# Patient Record
Sex: Male | Born: 1970 | Race: Black or African American | Hispanic: No | Marital: Single | Smoking: Current every day smoker
Health system: Southern US, Community
[De-identification: ages and names within clinical notes are randomized; demographics above are authoritative.]

## PROBLEM LIST (undated history)

## (undated) DIAGNOSIS — M545 Low back pain, unspecified: Secondary | ICD-10-CM

## (undated) DIAGNOSIS — F209 Schizophrenia, unspecified: Secondary | ICD-10-CM

## (undated) DIAGNOSIS — F32A Depression, unspecified: Secondary | ICD-10-CM

## (undated) DIAGNOSIS — B20 Human immunodeficiency virus [HIV] disease: Secondary | ICD-10-CM

## (undated) DIAGNOSIS — F1010 Alcohol abuse, uncomplicated: Secondary | ICD-10-CM

## (undated) DIAGNOSIS — F319 Bipolar disorder, unspecified: Secondary | ICD-10-CM

## (undated) DIAGNOSIS — G8929 Other chronic pain: Secondary | ICD-10-CM

## (undated) DIAGNOSIS — F329 Major depressive disorder, single episode, unspecified: Secondary | ICD-10-CM

## (undated) DIAGNOSIS — Z5900 Homelessness unspecified: Secondary | ICD-10-CM

## (undated) DIAGNOSIS — J45909 Unspecified asthma, uncomplicated: Secondary | ICD-10-CM

## (undated) DIAGNOSIS — Z21 Asymptomatic human immunodeficiency virus [HIV] infection status: Secondary | ICD-10-CM

## (undated) DIAGNOSIS — F1410 Cocaine abuse, uncomplicated: Secondary | ICD-10-CM

## (undated) DIAGNOSIS — F419 Anxiety disorder, unspecified: Secondary | ICD-10-CM

## (undated) DIAGNOSIS — M109 Gout, unspecified: Secondary | ICD-10-CM

## (undated) DIAGNOSIS — F1210 Cannabis abuse, uncomplicated: Secondary | ICD-10-CM

## (undated) DIAGNOSIS — F1020 Alcohol dependence, uncomplicated: Secondary | ICD-10-CM

## (undated) DIAGNOSIS — I10 Essential (primary) hypertension: Secondary | ICD-10-CM

## (undated) DIAGNOSIS — F99 Mental disorder, not otherwise specified: Secondary | ICD-10-CM

## (undated) HISTORY — DX: Gout, unspecified: M10.9

## (undated) HISTORY — DX: Homelessness unspecified: Z59.00

## (undated) HISTORY — DX: Cocaine abuse, uncomplicated: F14.10

## (undated) HISTORY — DX: Alcohol dependence, uncomplicated: F10.20

## (undated) HISTORY — DX: Homelessness: Z59.0

## (undated) HISTORY — DX: Cannabis abuse, uncomplicated: F12.10

## (undated) HISTORY — PX: SKIN GRAFT FULL THICKNESS LEG: SUR1299

---

## 1997-10-08 ENCOUNTER — Emergency Department (HOSPITAL_COMMUNITY): Admission: EM | Admit: 1997-10-08 | Discharge: 1997-10-08 | Payer: Self-pay | Admitting: *Deleted

## 1997-10-11 ENCOUNTER — Emergency Department (HOSPITAL_COMMUNITY): Admission: EM | Admit: 1997-10-11 | Discharge: 1997-10-11 | Payer: Self-pay | Admitting: Emergency Medicine

## 1997-10-18 ENCOUNTER — Emergency Department (HOSPITAL_COMMUNITY): Admission: EM | Admit: 1997-10-18 | Discharge: 1997-10-18 | Payer: Self-pay | Admitting: Emergency Medicine

## 2002-01-21 ENCOUNTER — Emergency Department (HOSPITAL_COMMUNITY): Admission: EM | Admit: 2002-01-21 | Discharge: 2002-01-21 | Payer: Self-pay | Admitting: Emergency Medicine

## 2002-02-21 ENCOUNTER — Encounter: Payer: Self-pay | Admitting: Emergency Medicine

## 2002-02-21 ENCOUNTER — Emergency Department (HOSPITAL_COMMUNITY): Admission: EM | Admit: 2002-02-21 | Discharge: 2002-02-21 | Payer: Self-pay | Admitting: Emergency Medicine

## 2002-02-26 ENCOUNTER — Emergency Department (HOSPITAL_COMMUNITY): Admission: EM | Admit: 2002-02-26 | Discharge: 2002-02-26 | Payer: Self-pay | Admitting: Emergency Medicine

## 2002-04-01 ENCOUNTER — Encounter: Payer: Self-pay | Admitting: Emergency Medicine

## 2002-04-01 ENCOUNTER — Emergency Department (HOSPITAL_COMMUNITY): Admission: EM | Admit: 2002-04-01 | Discharge: 2002-04-01 | Payer: Self-pay | Admitting: Emergency Medicine

## 2002-05-08 ENCOUNTER — Emergency Department (HOSPITAL_COMMUNITY): Admission: EM | Admit: 2002-05-08 | Discharge: 2002-05-08 | Payer: Self-pay | Admitting: Emergency Medicine

## 2002-07-24 ENCOUNTER — Emergency Department (HOSPITAL_COMMUNITY): Admission: EM | Admit: 2002-07-24 | Discharge: 2002-07-24 | Payer: Self-pay | Admitting: Emergency Medicine

## 2002-12-04 ENCOUNTER — Encounter: Payer: Self-pay | Admitting: Emergency Medicine

## 2002-12-04 ENCOUNTER — Emergency Department (HOSPITAL_COMMUNITY): Admission: EM | Admit: 2002-12-04 | Discharge: 2002-12-04 | Payer: Self-pay | Admitting: Emergency Medicine

## 2002-12-11 ENCOUNTER — Emergency Department (HOSPITAL_COMMUNITY): Admission: EM | Admit: 2002-12-11 | Discharge: 2002-12-11 | Payer: Self-pay | Admitting: Emergency Medicine

## 2003-01-12 ENCOUNTER — Emergency Department (HOSPITAL_COMMUNITY): Admission: EM | Admit: 2003-01-12 | Discharge: 2003-01-12 | Payer: Self-pay | Admitting: Emergency Medicine

## 2003-01-24 ENCOUNTER — Emergency Department (HOSPITAL_COMMUNITY): Admission: EM | Admit: 2003-01-24 | Discharge: 2003-01-24 | Payer: Self-pay | Admitting: Emergency Medicine

## 2003-03-04 ENCOUNTER — Emergency Department (HOSPITAL_COMMUNITY): Admission: EM | Admit: 2003-03-04 | Discharge: 2003-03-04 | Payer: Self-pay | Admitting: Emergency Medicine

## 2005-04-12 ENCOUNTER — Emergency Department (HOSPITAL_COMMUNITY): Admission: EM | Admit: 2005-04-12 | Discharge: 2005-04-12 | Payer: Self-pay | Admitting: Emergency Medicine

## 2005-04-22 ENCOUNTER — Emergency Department (HOSPITAL_COMMUNITY): Admission: EM | Admit: 2005-04-22 | Discharge: 2005-04-22 | Payer: Self-pay | Admitting: Emergency Medicine

## 2005-05-19 ENCOUNTER — Emergency Department (HOSPITAL_COMMUNITY): Admission: EM | Admit: 2005-05-19 | Discharge: 2005-05-19 | Payer: Self-pay | Admitting: Emergency Medicine

## 2005-07-04 ENCOUNTER — Emergency Department (HOSPITAL_COMMUNITY): Admission: EM | Admit: 2005-07-04 | Discharge: 2005-07-04 | Payer: Self-pay | Admitting: Emergency Medicine

## 2005-07-28 ENCOUNTER — Emergency Department (HOSPITAL_COMMUNITY): Admission: EM | Admit: 2005-07-28 | Discharge: 2005-07-28 | Payer: Self-pay | Admitting: Emergency Medicine

## 2005-08-23 ENCOUNTER — Emergency Department (HOSPITAL_COMMUNITY): Admission: EM | Admit: 2005-08-23 | Discharge: 2005-08-23 | Payer: Self-pay | Admitting: Emergency Medicine

## 2005-09-14 ENCOUNTER — Emergency Department (HOSPITAL_COMMUNITY): Admission: EM | Admit: 2005-09-14 | Discharge: 2005-09-14 | Payer: Self-pay | Admitting: Emergency Medicine

## 2005-10-21 ENCOUNTER — Emergency Department (HOSPITAL_COMMUNITY): Admission: EM | Admit: 2005-10-21 | Discharge: 2005-10-22 | Payer: Self-pay | Admitting: Emergency Medicine

## 2005-11-21 ENCOUNTER — Emergency Department (HOSPITAL_COMMUNITY): Admission: EM | Admit: 2005-11-21 | Discharge: 2005-11-21 | Payer: Self-pay | Admitting: Emergency Medicine

## 2006-01-19 ENCOUNTER — Emergency Department (HOSPITAL_COMMUNITY): Admission: EM | Admit: 2006-01-19 | Discharge: 2006-01-19 | Payer: Self-pay | Admitting: Emergency Medicine

## 2006-01-20 ENCOUNTER — Ambulatory Visit: Payer: Self-pay | Admitting: *Deleted

## 2006-02-04 ENCOUNTER — Emergency Department (HOSPITAL_COMMUNITY): Admission: EM | Admit: 2006-02-04 | Discharge: 2006-02-04 | Payer: Self-pay | Admitting: Emergency Medicine

## 2006-02-16 ENCOUNTER — Emergency Department (HOSPITAL_COMMUNITY): Admission: EM | Admit: 2006-02-16 | Discharge: 2006-02-16 | Payer: Self-pay | Admitting: Emergency Medicine

## 2006-03-01 ENCOUNTER — Emergency Department (HOSPITAL_COMMUNITY): Admission: EM | Admit: 2006-03-01 | Discharge: 2006-03-01 | Payer: Self-pay | Admitting: Emergency Medicine

## 2006-05-06 ENCOUNTER — Emergency Department (HOSPITAL_COMMUNITY): Admission: EM | Admit: 2006-05-06 | Discharge: 2006-05-06 | Payer: Self-pay | Admitting: Emergency Medicine

## 2006-06-02 ENCOUNTER — Emergency Department (HOSPITAL_COMMUNITY): Admission: EM | Admit: 2006-06-02 | Discharge: 2006-06-02 | Payer: Self-pay | Admitting: Emergency Medicine

## 2006-07-27 ENCOUNTER — Emergency Department (HOSPITAL_COMMUNITY): Admission: EM | Admit: 2006-07-27 | Discharge: 2006-07-27 | Payer: Self-pay | Admitting: Emergency Medicine

## 2006-10-11 ENCOUNTER — Emergency Department (HOSPITAL_COMMUNITY): Admission: EM | Admit: 2006-10-11 | Discharge: 2006-10-11 | Payer: Self-pay | Admitting: *Deleted

## 2006-10-30 ENCOUNTER — Emergency Department (HOSPITAL_COMMUNITY): Admission: EM | Admit: 2006-10-30 | Discharge: 2006-10-30 | Payer: Self-pay | Admitting: Emergency Medicine

## 2006-12-18 ENCOUNTER — Emergency Department (HOSPITAL_COMMUNITY): Admission: EM | Admit: 2006-12-18 | Discharge: 2006-12-18 | Payer: Self-pay | Admitting: Emergency Medicine

## 2007-03-17 ENCOUNTER — Emergency Department (HOSPITAL_COMMUNITY): Admission: EM | Admit: 2007-03-17 | Discharge: 2007-03-17 | Payer: Self-pay | Admitting: Emergency Medicine

## 2007-04-06 ENCOUNTER — Emergency Department (HOSPITAL_COMMUNITY): Admission: EM | Admit: 2007-04-06 | Discharge: 2007-04-06 | Payer: Self-pay | Admitting: Emergency Medicine

## 2007-05-02 ENCOUNTER — Emergency Department (HOSPITAL_COMMUNITY): Admission: EM | Admit: 2007-05-02 | Discharge: 2007-05-02 | Payer: Self-pay | Admitting: Emergency Medicine

## 2007-05-22 ENCOUNTER — Emergency Department (HOSPITAL_COMMUNITY): Admission: EM | Admit: 2007-05-22 | Discharge: 2007-05-22 | Payer: Self-pay | Admitting: Emergency Medicine

## 2007-06-01 ENCOUNTER — Emergency Department (HOSPITAL_COMMUNITY): Admission: EM | Admit: 2007-06-01 | Discharge: 2007-06-01 | Payer: Self-pay | Admitting: Emergency Medicine

## 2008-01-05 ENCOUNTER — Emergency Department (HOSPITAL_COMMUNITY): Admission: EM | Admit: 2008-01-05 | Discharge: 2008-01-05 | Payer: Self-pay | Admitting: Emergency Medicine

## 2008-02-28 ENCOUNTER — Emergency Department (HOSPITAL_COMMUNITY): Admission: EM | Admit: 2008-02-28 | Discharge: 2008-02-28 | Payer: Self-pay | Admitting: Emergency Medicine

## 2008-04-25 ENCOUNTER — Emergency Department (HOSPITAL_COMMUNITY): Admission: EM | Admit: 2008-04-25 | Discharge: 2008-04-25 | Payer: Self-pay | Admitting: Emergency Medicine

## 2008-05-08 ENCOUNTER — Emergency Department (HOSPITAL_COMMUNITY): Admission: EM | Admit: 2008-05-08 | Discharge: 2008-05-08 | Payer: Self-pay | Admitting: Emergency Medicine

## 2008-05-13 ENCOUNTER — Emergency Department (HOSPITAL_COMMUNITY): Admission: EM | Admit: 2008-05-13 | Discharge: 2008-05-13 | Payer: Self-pay | Admitting: Emergency Medicine

## 2008-09-10 ENCOUNTER — Emergency Department (HOSPITAL_COMMUNITY): Admission: EM | Admit: 2008-09-10 | Discharge: 2008-09-10 | Payer: Self-pay | Admitting: Emergency Medicine

## 2008-11-28 ENCOUNTER — Emergency Department (HOSPITAL_COMMUNITY): Admission: EM | Admit: 2008-11-28 | Discharge: 2008-11-28 | Payer: Self-pay | Admitting: Emergency Medicine

## 2009-02-13 ENCOUNTER — Emergency Department (HOSPITAL_COMMUNITY): Admission: EM | Admit: 2009-02-13 | Discharge: 2009-02-13 | Payer: Self-pay | Admitting: Emergency Medicine

## 2009-02-27 ENCOUNTER — Emergency Department (HOSPITAL_COMMUNITY): Admission: EM | Admit: 2009-02-27 | Discharge: 2009-02-27 | Payer: Self-pay | Admitting: Emergency Medicine

## 2009-03-01 ENCOUNTER — Emergency Department (HOSPITAL_COMMUNITY): Admission: EM | Admit: 2009-03-01 | Discharge: 2009-03-01 | Payer: Self-pay | Admitting: Emergency Medicine

## 2009-03-04 ENCOUNTER — Emergency Department (HOSPITAL_COMMUNITY): Admission: EM | Admit: 2009-03-04 | Discharge: 2009-03-04 | Payer: Self-pay | Admitting: Emergency Medicine

## 2009-03-11 ENCOUNTER — Emergency Department (HOSPITAL_COMMUNITY): Admission: EM | Admit: 2009-03-11 | Discharge: 2009-03-12 | Payer: Self-pay | Admitting: Emergency Medicine

## 2009-03-25 ENCOUNTER — Emergency Department (HOSPITAL_COMMUNITY): Admission: EM | Admit: 2009-03-25 | Discharge: 2009-03-25 | Payer: Self-pay | Admitting: Emergency Medicine

## 2009-04-26 ENCOUNTER — Emergency Department (HOSPITAL_COMMUNITY): Admission: EM | Admit: 2009-04-26 | Discharge: 2009-04-26 | Payer: Self-pay | Admitting: Emergency Medicine

## 2009-04-29 ENCOUNTER — Ambulatory Visit: Payer: Self-pay | Admitting: Internal Medicine

## 2009-05-01 ENCOUNTER — Ambulatory Visit: Payer: Self-pay | Admitting: Internal Medicine

## 2009-05-04 ENCOUNTER — Emergency Department (HOSPITAL_COMMUNITY): Admission: EM | Admit: 2009-05-04 | Discharge: 2009-05-04 | Payer: Self-pay | Admitting: Emergency Medicine

## 2009-05-29 ENCOUNTER — Emergency Department (HOSPITAL_COMMUNITY): Admission: EM | Admit: 2009-05-29 | Discharge: 2009-05-29 | Payer: Self-pay | Admitting: Emergency Medicine

## 2009-06-03 ENCOUNTER — Emergency Department (HOSPITAL_COMMUNITY): Admission: EM | Admit: 2009-06-03 | Discharge: 2009-06-03 | Payer: Self-pay | Admitting: Emergency Medicine

## 2009-06-25 ENCOUNTER — Emergency Department (HOSPITAL_COMMUNITY): Admission: EM | Admit: 2009-06-25 | Discharge: 2009-06-25 | Payer: Self-pay | Admitting: Emergency Medicine

## 2009-08-09 ENCOUNTER — Emergency Department (HOSPITAL_COMMUNITY): Admission: EM | Admit: 2009-08-09 | Discharge: 2009-08-09 | Payer: Self-pay | Admitting: Emergency Medicine

## 2009-09-21 ENCOUNTER — Emergency Department (HOSPITAL_COMMUNITY): Admission: EM | Admit: 2009-09-21 | Discharge: 2009-09-22 | Payer: Self-pay | Admitting: Emergency Medicine

## 2009-09-25 ENCOUNTER — Emergency Department (HOSPITAL_COMMUNITY): Admission: EM | Admit: 2009-09-25 | Discharge: 2009-09-26 | Payer: Self-pay | Admitting: Emergency Medicine

## 2009-09-26 ENCOUNTER — Emergency Department (HOSPITAL_COMMUNITY): Admission: EM | Admit: 2009-09-26 | Discharge: 2009-09-26 | Payer: Self-pay | Admitting: Emergency Medicine

## 2009-10-18 ENCOUNTER — Emergency Department (HOSPITAL_COMMUNITY): Admission: EM | Admit: 2009-10-18 | Discharge: 2009-10-19 | Payer: Self-pay | Admitting: Emergency Medicine

## 2009-11-03 ENCOUNTER — Emergency Department (HOSPITAL_COMMUNITY): Admission: EM | Admit: 2009-11-03 | Discharge: 2009-11-03 | Payer: Self-pay | Admitting: Emergency Medicine

## 2009-11-08 ENCOUNTER — Emergency Department (HOSPITAL_COMMUNITY): Admission: EM | Admit: 2009-11-08 | Discharge: 2009-11-08 | Payer: Self-pay | Admitting: Emergency Medicine

## 2010-02-25 ENCOUNTER — Emergency Department (HOSPITAL_COMMUNITY)
Admission: EM | Admit: 2010-02-25 | Discharge: 2010-02-26 | Payer: Self-pay | Source: Home / Self Care | Admitting: Emergency Medicine

## 2010-03-19 ENCOUNTER — Encounter: Payer: Self-pay | Admitting: Adult Health

## 2010-03-19 DIAGNOSIS — B20 Human immunodeficiency virus [HIV] disease: Secondary | ICD-10-CM | POA: Insufficient documentation

## 2010-03-25 ENCOUNTER — Ambulatory Visit: Admit: 2010-03-25 | Payer: Self-pay | Admitting: Adult Health

## 2010-03-26 ENCOUNTER — Ambulatory Visit: Admit: 2010-03-26 | Payer: Self-pay | Admitting: Adult Health

## 2010-04-08 ENCOUNTER — Encounter: Payer: Self-pay | Admitting: Adult Health

## 2010-04-08 ENCOUNTER — Ambulatory Visit
Admission: RE | Admit: 2010-04-08 | Discharge: 2010-04-08 | Payer: Self-pay | Source: Home / Self Care | Attending: Adult Health | Admitting: Adult Health

## 2010-04-08 LAB — CONVERTED CEMR LAB
HIV 1 RNA Quant: 60 copies/mL — ABNORMAL HIGH (ref <20)
HIV-1 RNA Quant, Log: 1.78 — ABNORMAL HIGH (ref <1.30)

## 2010-04-09 LAB — T-HELPER CELL (CD4) - (RCID CLINIC ONLY): CD4 T Cell Abs: 1180 uL (ref 400–2700)

## 2010-04-10 ENCOUNTER — Ambulatory Visit: Admit: 2010-04-10 | Payer: Self-pay | Admitting: Adult Health

## 2010-04-16 LAB — CONVERTED CEMR LAB
ALT: 93 units/L — ABNORMAL HIGH (ref 0–53)
AST: 78 units/L — ABNORMAL HIGH (ref 0–37)
Basophils Absolute: 0 10*3/uL (ref 0.0–0.1)
CO2: 21 meq/L (ref 19–32)
Calcium: 9.3 mg/dL (ref 8.4–10.5)
Chloride: 108 meq/L (ref 96–112)
Cholesterol: 148 mg/dL (ref 0–200)
Creatinine, Ser: 0.86 mg/dL (ref 0.40–1.50)
Eosinophils Absolute: 0.2 10*3/uL (ref 0.0–0.7)
Eosinophils Relative: 4 % (ref 0–5)
HCT: 43.1 % (ref 39.0–52.0)
HIV-1 antibody: POSITIVE — AB
HIV-2 Ab: NEGATIVE
Hep A Total Ab: NEGATIVE
Hep B Core Total Ab: POSITIVE — AB
Hep B S Ab: NEGATIVE
MCV: 91.7 fL (ref 78.0–100.0)
Neutrophils Relative %: 28 % — ABNORMAL LOW (ref 43–77)
Platelets: 239 10*3/uL (ref 150–400)
RDW: 15.1 % (ref 11.5–15.5)
Sodium: 144 meq/L (ref 135–145)
Total Bilirubin: 0.4 mg/dL (ref 0.3–1.2)
Total Protein: 7.4 g/dL (ref 6.0–8.3)
Triglycerides: 94 mg/dL (ref <150)

## 2010-04-16 NOTE — Miscellaneous (Signed)
Summary: Orders Update  Clinical Lists Changes  Problems: Added new problem of HIV INFECTION (ICD-042) Orders: Added new Test order of T-Chlamydia  Probe, urine [id number]) - Signed Added new Test order of T-CBC w/Diff [phone]) - Signed Added new Test order of T-CD4SP [facility]) (CD4SP) - Signed Added new Test order of T-GC Probe, urine [id number]) - Signed Added new Test order of T-Comprehensive Metabolic Panel [phone]) - Signed Added new Test order of T-Hepatitis B Surface Antigen [phone]) - Signed Added new Test order of T-Hepatitis B Surface Antibody [id number]) - Signed Added new Test order of T-Hepatitis B Core Antibody ([id number]) - Signed Added new Test order of T-Hepatitis A Antibody ([id number]) - Signed Added new Test order of T-Hepatitis C Viral Load ([id number]) - Signed Added new Test order of T-HIV Viral Load [phone]) - Signed Added new Test order of T-HIV Ab Confirmatory Test/Western Blot ([id number]) - Signed Added new Test order of T-RPR (Syphilis) ([id number]) - Signed Added new Test order of T-Lipid Profile ([id number]) - Signed Added new Test order of T-Urinalysis ([id number]) - Signed

## 2010-04-23 ENCOUNTER — Ambulatory Visit (INDEPENDENT_AMBULATORY_CARE_PROVIDER_SITE_OTHER): Payer: Self-pay | Admitting: Adult Health

## 2010-04-23 DIAGNOSIS — B20 Human immunodeficiency virus [HIV] disease: Secondary | ICD-10-CM

## 2010-04-27 ENCOUNTER — Encounter: Payer: Self-pay | Admitting: Infectious Diseases

## 2010-05-06 NOTE — Miscellaneous (Signed)
Summary: HIPAA Restrictions  HIPAA Restrictions   Imported By: Florinda Marker 04/27/2010 17:52:28  _____________________________________________________________________  External Attachment:    Type:   Image     Comment:   External Document

## 2010-05-29 LAB — CBC
HCT: 39.9 % (ref 39.0–52.0)
MCHC: 34.8 g/dL (ref 30.0–36.0)
Platelets: 333 10*3/uL (ref 150–400)
RDW: 12.5 % (ref 11.5–15.5)

## 2010-05-29 LAB — BASIC METABOLIC PANEL
BUN: 12 mg/dL (ref 6–23)
Calcium: 8.8 mg/dL (ref 8.4–10.5)
Creatinine, Ser: 0.91 mg/dL (ref 0.4–1.5)
GFR calc non Af Amer: >60 mL/min (ref >60)

## 2010-05-29 LAB — POCT CARDIAC MARKERS
Myoglobin, poc: 57.4 ng/mL (ref 12–200)
Troponin i, poc: <0.05 ng/mL (ref 0.00–0.09)

## 2010-05-29 LAB — DIFFERENTIAL
Basophils Absolute: 0 10*3/uL (ref 0.0–0.1)
Basophils Relative: 1 % (ref 0–1)
Monocytes Absolute: 0.5 10*3/uL (ref 0.1–1.0)
Neutro Abs: 2.4 10*3/uL (ref 1.7–7.7)
Neutrophils Relative %: 44 % (ref 43–77)

## 2010-06-04 ENCOUNTER — Ambulatory Visit: Payer: Self-pay | Admitting: Adult Health

## 2010-06-11 ENCOUNTER — Emergency Department (HOSPITAL_COMMUNITY): Payer: Self-pay

## 2010-06-11 ENCOUNTER — Emergency Department (HOSPITAL_COMMUNITY)
Admission: EM | Admit: 2010-06-11 | Discharge: 2010-06-11 | Disposition: A | Discharge status: Home / Self Care | Payer: Self-pay | Attending: Emergency Medicine | Admitting: Emergency Medicine

## 2010-06-11 DIAGNOSIS — R0609 Other forms of dyspnea: Secondary | ICD-10-CM | POA: Insufficient documentation

## 2010-06-11 DIAGNOSIS — J45909 Unspecified asthma, uncomplicated: Secondary | ICD-10-CM | POA: Insufficient documentation

## 2010-06-11 DIAGNOSIS — J40 Bronchitis, not specified as acute or chronic: Secondary | ICD-10-CM | POA: Insufficient documentation

## 2010-06-11 DIAGNOSIS — R0989 Other specified symptoms and signs involving the circulatory and respiratory systems: Secondary | ICD-10-CM | POA: Insufficient documentation

## 2010-06-11 DIAGNOSIS — R079 Chest pain, unspecified: Secondary | ICD-10-CM | POA: Insufficient documentation

## 2010-06-11 DIAGNOSIS — R000 Tachycardia, unspecified: Secondary | ICD-10-CM | POA: Insufficient documentation

## 2010-06-11 DIAGNOSIS — Z21 Asymptomatic human immunodeficiency virus [HIV] infection status: Secondary | ICD-10-CM | POA: Insufficient documentation

## 2010-06-11 DIAGNOSIS — R0602 Shortness of breath: Secondary | ICD-10-CM | POA: Insufficient documentation

## 2010-06-11 DIAGNOSIS — F411 Generalized anxiety disorder: Secondary | ICD-10-CM | POA: Insufficient documentation

## 2010-06-11 DIAGNOSIS — R059 Cough, unspecified: Secondary | ICD-10-CM | POA: Insufficient documentation

## 2010-06-11 DIAGNOSIS — R05 Cough: Secondary | ICD-10-CM | POA: Insufficient documentation

## 2010-06-11 LAB — DIFFERENTIAL
Basophils Absolute: 0.1 10*3/uL (ref 0.0–0.1)
Eosinophils Relative: 3 % (ref 0–5)
Lymphocytes Relative: 54 % — ABNORMAL HIGH (ref 12–46)
Lymphs Abs: 2.5 10*3/uL (ref 0.7–4.0)
Monocytes Absolute: 0.6 10*3/uL (ref 0.1–1.0)

## 2010-06-11 LAB — URINALYSIS, ROUTINE W REFLEX MICROSCOPIC
Leukocytes, UA: NEGATIVE
Nitrite: NEGATIVE
Specific Gravity, Urine: 1.029 (ref 1.005–1.030)
pH: 5.5 (ref 5.0–8.0)

## 2010-06-11 LAB — URINE MICROSCOPIC-ADD ON

## 2010-06-11 LAB — CBC
HCT: 43.6 % (ref 39.0–52.0)
MCHC: 35.6 g/dL (ref 30.0–36.0)
MCV: 92.6 fL (ref 78.0–100.0)
RDW: 13.3 % (ref 11.5–15.5)

## 2010-06-11 LAB — LACTATE DEHYDROGENASE: LDH: 215 U/L (ref 94–250)

## 2010-06-11 LAB — COMPREHENSIVE METABOLIC PANEL
Albumin: 4.1 g/dL (ref 3.5–5.2)
BUN: 10 mg/dL (ref 6–23)
Creatinine, Ser: 0.99 mg/dL (ref 0.4–1.5)
Total Protein: 7.3 g/dL (ref 6.0–8.3)

## 2010-06-12 LAB — URINE CULTURE: Culture  Setup Time: 201203291258

## 2010-06-25 ENCOUNTER — Encounter: Payer: Self-pay | Admitting: Adult Health

## 2010-06-25 ENCOUNTER — Ambulatory Visit (INDEPENDENT_AMBULATORY_CARE_PROVIDER_SITE_OTHER): Payer: Self-pay | Admitting: Adult Health

## 2010-06-25 DIAGNOSIS — Z79899 Other long term (current) drug therapy: Secondary | ICD-10-CM

## 2010-06-25 DIAGNOSIS — B20 Human immunodeficiency virus [HIV] disease: Secondary | ICD-10-CM

## 2010-06-25 DIAGNOSIS — E785 Hyperlipidemia, unspecified: Secondary | ICD-10-CM

## 2010-06-25 LAB — LIPID PANEL
Cholesterol: 168 mg/dL (ref 0–200)
HDL: 103 mg/dL (ref >39)
Total CHOL/HDL Ratio: 1.6 Ratio

## 2010-06-25 NOTE — Progress Notes (Signed)
  Subjective:    Patient ID: John [name], male    DOB: [birth year], 40 y.o.   MRN: [id number]  HPI In for f/u.  Has not had labs drawn for re-staging.  Not on meds at present, but  Interested in starting therapy.   Review of Systems  Constitutional: Negative.   HENT: Negative.   Eyes: Negative.   Respiratory: Negative.   Cardiovascular: Negative.   Gastrointestinal: Negative.   Genitourinary: Negative.   Musculoskeletal: Negative.   Skin: Negative.   Neurological: Negative.   Hematological: Negative.   Psychiatric/Behavioral: Negative.        Objective:   Physical Exam  Constitutional: He is oriented to person, place, and time. He appears well-developed and well-nourished.  HENT:  Head: Normocephalic and atraumatic.  Right Ear: External ear normal.  Left Ear: External ear normal.  Nose: Nose normal.  Mouth/Throat: Oropharynx is clear and moist.  Eyes: Conjunctivae are normal. Pupils are equal, round, and reactive to light.  Neck: Normal range of motion. Neck supple.  Cardiovascular: Normal rate, regular rhythm, normal heart sounds and intact distal pulses.   Pulmonary/Chest: Effort normal and breath sounds normal.  Abdominal: Soft. Bowel sounds are normal.  Musculoskeletal: Normal range of motion.  Neurological: He is alert and oriented to person, place, and time. Coordination normal.  Skin: Skin is warm and dry.  Psychiatric: He has a normal mood and affect. His behavior is normal. Judgment and thought content normal.          Assessment & Plan:  HIV:  No recent labs.  Previous CD4 was 1180 and VL 60 copies/ml.  While he demonstrates a poorly-fit virus, we should obtain re-staging labs to determine stability in this response.  Meanwhile, we will refer him to Deirdre Evener, RN from Research to see if he might be qualifiable for the 5303 study.  He should RTC in 2 weeks for f/u.  Dyslipidemia:  We have a random LD of 212.  We will recheck lipids today and f/u on next  visit.

## 2010-06-26 LAB — T-HELPER CELL (CD4) - (RCID CLINIC ONLY)
CD4 % Helper T Cell: 39 % (ref 33–55)
CD4 T Cell Abs: 520 uL (ref 400–2700)

## 2010-06-27 LAB — HIV-1 RNA ULTRAQUANT REFLEX TO GENTYP+: HIV-1 RNA Quant, Log: 2 {Log} — ABNORMAL HIGH (ref <1.30)

## 2010-07-17 ENCOUNTER — Ambulatory Visit (INDEPENDENT_AMBULATORY_CARE_PROVIDER_SITE_OTHER): Payer: Self-pay | Admitting: Adult Health

## 2010-07-17 ENCOUNTER — Encounter: Payer: Self-pay | Admitting: Adult Health

## 2010-07-17 VITALS — BP 112/79 | HR 80 | Temp 98.3°F | Ht 69.0 in | Wt 169.0 lb

## 2010-07-17 DIAGNOSIS — Z23 Encounter for immunization: Secondary | ICD-10-CM

## 2010-07-17 DIAGNOSIS — B20 Human immunodeficiency virus [HIV] disease: Secondary | ICD-10-CM

## 2010-07-17 DIAGNOSIS — J309 Allergic rhinitis, unspecified: Secondary | ICD-10-CM

## 2010-07-17 NOTE — Progress Notes (Signed)
Subjective:    Patient ID: John [name], male    DOB: 1970-07-02, 40 y.o.   MRN: [id number]  HPI presents to clinic today to discuss starting antiretroviral therapy. He also complains of a two-day onset of sinus congestion and drainage with watery, irritated eyes and an irritating cough. Denies any fevers, chills, sweats, shortness of breath or sinus pressure. [name] definitely wants to be on antiretroviral therapy, but is uncertain of what treatment. Strategies. Would entail.     Review of Systems  Constitutional: Negative.   HENT: Positive for congestion, rhinorrhea, sneezing and postnasal drip. Negative for ear pain, nosebleeds, sore throat, facial swelling, drooling, mouth sores, trouble swallowing, dental problem, voice change, sinus pressure, tinnitus and ear discharge.   Eyes: Positive for redness and itching. Negative for photophobia, pain, discharge and visual disturbance.  Respiratory: Positive for cough. Negative for apnea, choking, chest tightness, shortness of breath, wheezing and stridor.   Cardiovascular: Negative.   Gastrointestinal: Negative.   Genitourinary: Negative.   Musculoskeletal: Negative.   Skin: Negative.   Neurological: Negative.   Hematological: Negative.   Psychiatric/Behavioral: Negative.        Objective:   Physical Exam  Constitutional: He is oriented to person, place, and time. He appears well-developed and well-nourished. No distress.  HENT:  Head: Normocephalic and atraumatic.  Right Ear: External ear normal.  Left Ear: External ear normal.  Mouth/Throat: No oropharyngeal exudate.       Rhinorrhea noted, with significant copious, postnasal drip.  Eyes: Conjunctivae are normal. Pupils are equal, round, and reactive to light. Right eye exhibits no discharge. Left eye exhibits no discharge.       Sclerae red with tearing, but no conjunctival injection noted.  Neck: Normal range of motion. Neck supple. No thyromegaly present.  Cardiovascular:  Normal rate, regular rhythm, normal heart sounds and intact distal pulses.   Pulmonary/Chest: Effort normal and breath sounds normal.  Abdominal: Soft. Bowel sounds are normal.  Musculoskeletal: Normal range of motion.  Neurological: He is alert and oriented to person, place, and time. No cranial nerve deficit. Coordination normal.  Skin: Skin is warm and dry. No rash noted.  Psychiatric: He has a normal mood and affect. His behavior is normal. Judgment and thought content normal.          Assessment & Plan:    1. Allergic Rhinitis. Recommended at present to avoid outdoor exposure to pollen. Also advised on using cetirizine 10 mg by mouth daily until otherwise advised. Should increase moisture to the nasal mucosa using a normal saline nasal spray as needed. If symptoms worsen. He is to notify us and we will treat with a short course of steroids.  2. HIV. Discussed in detail options for treatment including a referral to research to evaluate participation in one of the studies. We recommended that he be evaluated for the START study and should he not be randomized to treatment. We can reevaluate him for the 5303 study. If he is not interested in any of these studies. He can return to our office and we will discuss treatment options and have him begin [facility] application. If on the STA, R., T. study, he is randomized to treatment, we will consider starting Complera therapy for treatment of his HIV. He verbally acknowledged all this information and agreed with plan. A referral was made to the research team and he was seen by the research staff and a scheduled visit with them for initial evaluation was made. He will followup with me  once he either starts to treatment or chooses not to participate at which time we will discuss treatment strategy then. Followup then will be dependent on scheduling with research for now.

## 2010-07-29 ENCOUNTER — Ambulatory Visit (INDEPENDENT_AMBULATORY_CARE_PROVIDER_SITE_OTHER): Payer: Self-pay | Admitting: *Deleted

## 2010-07-29 VITALS — BP 125/78 | HR 73 | Temp 98.6°F | Resp 16 | Ht 69.5 in | Wt 173.0 lb

## 2010-07-29 DIAGNOSIS — B20 Human immunodeficiency virus [HIV] disease: Secondary | ICD-10-CM

## 2010-07-29 LAB — BASIC METABOLIC PANEL
Chloride: 102 mEq/L (ref 96–112)
Potassium: 4.3 mEq/L (ref 3.5–5.3)

## 2010-07-29 LAB — POCT URINALYSIS DIPSTICK
Bilirubin, UA: NEGATIVE
Ketones, UA: NEGATIVE
Leukocytes, UA: NEGATIVE
Nitrite, UA: NEGATIVE
Protein, UA: NEGATIVE

## 2010-07-29 LAB — HEPATIC FUNCTION PANEL
Albumin: 4.4 g/dL (ref 3.5–5.2)
Total Protein: 7 g/dL (ref 6.0–8.3)

## 2010-07-29 NOTE — Progress Notes (Signed)
Patient here to screen for the START study. Informed consent was obtained and all questions answered. He currently has c/o seasonal allergy symptoms (itchy eyes, sneezing, nasal congestion and runny nose). He says he believes he acquired HIV in the mid 1990s through unprotected sex. He was diagnosed last November when he was going to [facility] for rehab. He admits to drinking as much alcohol as he can get daily, up to a fifth/day and does not feel the need to stop. I asked him if he thought the drinking would prevent him from taking HIV meds correctly  if he got randomized to them and he said it wouldn't as long as he could take them once a day. He will return in 2 weeks to repeat the CD4 and get an EKG.

## 2010-07-30 LAB — HEPATITIS C ANTIBODY: HCV Ab: NEGATIVE

## 2010-07-30 LAB — HIV ANTIBODY (ROUTINE TESTING W REFLEX): HIV: REACTIVE

## 2010-08-04 LAB — HIV 1/2 CONFIRMATION: HIV-2 Ab: NEGATIVE

## 2010-08-18 ENCOUNTER — Ambulatory Visit (INDEPENDENT_AMBULATORY_CARE_PROVIDER_SITE_OTHER): Payer: Self-pay | Admitting: *Deleted

## 2010-08-18 VITALS — BP 114/81 | HR 76 | Temp 98.5°F | Resp 18 | Wt 171.5 lb

## 2010-08-18 DIAGNOSIS — B20 Human immunodeficiency virus [HIV] disease: Secondary | ICD-10-CM

## 2010-08-18 LAB — BASIC METABOLIC PANEL
BUN: 17 mg/dL (ref 6–23)
Chloride: 108 mEq/L (ref 96–112)
Potassium: 4.9 mEq/L (ref 3.5–5.3)
Sodium: 143 mEq/L (ref 135–145)

## 2010-08-18 LAB — LIPID PANEL
Cholesterol: 157 mg/dL (ref 0–200)
VLDL: 11 mg/dL (ref 0–40)

## 2010-08-18 NOTE — Progress Notes (Signed)
John [name] came in for the second screening visit for START. He denies any new problems or concerns and still wishes to be on study. An EKG was performed with Epoint of 12 and V6 of 17. Fasting labs and CD4 and VL were drawn. Once his labs come back, I will call him to see if he is still interested in the study before randomization.

## 2010-08-19 LAB — HIV-1 RNA QUANT-NO REFLEX-BLD
HIV 1 RNA Quant: 337 copies/mL — ABNORMAL HIGH (ref <20)
HIV-1 RNA Quant, Log: 2.53 {Log} — ABNORMAL HIGH (ref <1.30)

## 2010-08-24 ENCOUNTER — Ambulatory Visit: Payer: Self-pay | Admitting: *Deleted

## 2010-08-24 DIAGNOSIS — B20 Human immunodeficiency virus [HIV] disease: Secondary | ICD-10-CM

## 2010-08-24 NOTE — Progress Notes (Signed)
Patient came in today to have his CD4 recheck for START study entry. The last one was 403. If it still remains under 500, he will not be eligible for stuyd.

## 2010-08-25 ENCOUNTER — Emergency Department (HOSPITAL_COMMUNITY)
Admission: EM | Admit: 2010-08-25 | Discharge: 2010-08-25 | Disposition: A | Discharge status: Home / Self Care | Payer: Self-pay | Attending: Emergency Medicine | Admitting: Emergency Medicine

## 2010-08-25 ENCOUNTER — Emergency Department (HOSPITAL_COMMUNITY): Payer: Self-pay

## 2010-08-25 DIAGNOSIS — R0609 Other forms of dyspnea: Secondary | ICD-10-CM | POA: Insufficient documentation

## 2010-08-25 DIAGNOSIS — Z21 Asymptomatic human immunodeficiency virus [HIV] infection status: Secondary | ICD-10-CM | POA: Insufficient documentation

## 2010-08-25 DIAGNOSIS — J45909 Unspecified asthma, uncomplicated: Secondary | ICD-10-CM | POA: Insufficient documentation

## 2010-08-25 DIAGNOSIS — F411 Generalized anxiety disorder: Secondary | ICD-10-CM | POA: Insufficient documentation

## 2010-08-25 DIAGNOSIS — R0989 Other specified symptoms and signs involving the circulatory and respiratory systems: Secondary | ICD-10-CM | POA: Insufficient documentation

## 2010-08-25 DIAGNOSIS — R059 Cough, unspecified: Secondary | ICD-10-CM | POA: Insufficient documentation

## 2010-08-25 DIAGNOSIS — R0789 Other chest pain: Secondary | ICD-10-CM | POA: Insufficient documentation

## 2010-08-25 DIAGNOSIS — R05 Cough: Secondary | ICD-10-CM | POA: Insufficient documentation

## 2010-09-08 ENCOUNTER — Ambulatory Visit (INDEPENDENT_AMBULATORY_CARE_PROVIDER_SITE_OTHER): Payer: Self-pay | Admitting: Adult Health

## 2010-09-08 ENCOUNTER — Encounter: Payer: Self-pay | Admitting: Adult Health

## 2010-09-08 VITALS — BP 129/82 | HR 56 | Temp 98.2°F | Ht 69.0 in | Wt 167.0 lb

## 2010-09-08 DIAGNOSIS — B20 Human immunodeficiency virus [HIV] disease: Secondary | ICD-10-CM

## 2010-09-08 NOTE — Progress Notes (Signed)
  Subjective:    Patient ID: John [name], male    DOB: 03/10/1971, 40 y.o.   MRN: [id number]  HPI Presents to clinic for scheduled followup. Voices no complaints. States feeling well and a normal state of good health. Remains nave to antiretroviral therapy, but paperwork has been submitted for randomization by research in the START study.   Review of Systems  Constitutional: Negative.   HENT: Negative.   Eyes: Negative.   Respiratory: Negative.   Cardiovascular: Negative.   Gastrointestinal: Negative.   Genitourinary: Negative.   Musculoskeletal: Negative.   Skin: Negative.   Neurological: Negative.   Hematological: Negative.   Psychiatric/Behavioral: Negative.        Objective:   Physical Exam  Constitutional: He is oriented to person, place, and time. He appears well-developed and well-nourished. No distress.  HENT:  Head: Normocephalic and atraumatic.       Poor dentition  Eyes: Conjunctivae and EOM are normal. Pupils are equal, round, and reactive to light.  Neck: Normal range of motion. Neck supple.  Cardiovascular: Normal rate, regular rhythm, normal heart sounds and intact distal pulses.   Pulmonary/Chest: Effort normal and breath sounds normal.  Abdominal: Soft. Bowel sounds are normal.  Musculoskeletal: Normal range of motion.  Neurological: He is alert and oriented to person, place, and time. He has normal reflexes.  Skin: Skin is warm and dry.  Psychiatric: He has a normal mood and affect. His behavior is normal. Judgment and thought content normal.          Assessment & Plan:  1. HIV. Labs obtained 08/24/2010 show a CD4 count of 845 at 33.8% with a viral load of 337 copies/mL. Currently we are waiting for randomization into the Start study. Once we know whether or not he will be randomized to treatment. He can decide whether or he wants to stay in the study or return to see [name] any eval and appropriate. Regimen.  He verbally acknowledged all this  information and agreed with plan of care.

## 2010-10-08 ENCOUNTER — Encounter: Payer: Self-pay | Admitting: *Deleted

## 2010-10-21 ENCOUNTER — Ambulatory Visit (INDEPENDENT_AMBULATORY_CARE_PROVIDER_SITE_OTHER): Payer: Self-pay | Admitting: *Deleted

## 2010-10-21 VITALS — BP 134/89 | HR 65 | Temp 98.2°F | Resp 16 | Wt 172.5 lb

## 2010-10-21 DIAGNOSIS — B20 Human immunodeficiency virus [HIV] disease: Secondary | ICD-10-CM

## 2010-10-21 LAB — BASIC METABOLIC PANEL
BUN: 10 mg/dL (ref 6–23)
Creat: 0.81 mg/dL (ref 0.50–1.35)
Glucose, Bld: 79 mg/dL (ref 70–99)
Potassium: 3.6 mEq/L (ref 3.5–5.3)

## 2010-10-21 NOTE — Progress Notes (Signed)
Patient here for his 1 month START study visit. He denies any new problems or concerns. He will return in October for the next study visit.

## 2010-10-22 LAB — POCT URINALYSIS DIPSTICK
Blood, UA: NEGATIVE
Glucose, UA: NEGATIVE
Nitrite, UA: NEGATIVE
Spec Grav, UA: 1.02
Urobilinogen, UA: 0.2
pH, UA: 6.5

## 2010-10-23 LAB — HIV-1 RNA QUANT-NO REFLEX-BLD
HIV 1 RNA Quant: 302 copies/mL — ABNORMAL HIGH (ref <20)
HIV-1 RNA Quant, Log: 2.48 {Log} — ABNORMAL HIGH (ref <1.30)

## 2010-10-29 ENCOUNTER — Encounter: Payer: Self-pay | Admitting: Adult Health

## 2010-10-29 LAB — CD4/CD8 (T-HELPER/T-SUPPRESSOR CELL)
CD8 % Suppressor T Cell: 51.5
CD8: 1185

## 2010-12-03 LAB — INFLUENZA A+B VIRUS AG-DIRECT(RAPID)
Inflenza A Ag: NEGATIVE
Influenza B Ag: NEGATIVE

## 2010-12-07 NOTE — Progress Notes (Signed)
Subjective:    Patient ID: John [name], male    DOB: 03-26-1970, 40 y.o.   MRN: [id number]  HPI 40 year old, African American male with a history of HIV, newly diagnosed. Denies any known past sequela to HIV or past treatments for HIV, associated problems. Endorses. He has been in his normal state of good health with no complications and no current complaints. He is antiretroviral nave, has no significant past medical history no significant past surgical history and is currently not on any medications.   Review of Systems  Constitutional: Negative for fever, chills, diaphoresis, activity change, appetite change, fatigue and unexpected weight change.  HENT: Negative for hearing loss, ear pain, nosebleeds, congestion, sore throat, facial swelling, rhinorrhea, sneezing, drooling, mouth sores, trouble swallowing, neck pain, neck stiffness, dental problem, voice change, postnasal drip, sinus pressure, tinnitus and ear discharge.   Eyes: Negative for photophobia, pain, discharge, redness, itching and visual disturbance.  Respiratory: Negative for apnea, cough, choking, chest tightness, shortness of breath, wheezing and stridor.   Cardiovascular: Negative for chest pain, palpitations and leg swelling.  Gastrointestinal: Negative for nausea, vomiting, abdominal pain, diarrhea, constipation, blood in stool, abdominal distention, anal bleeding and rectal pain.  Genitourinary: Negative for dysuria, urgency, frequency, hematuria, flank pain, decreased urine volume, discharge, penile swelling, scrotal swelling, enuresis, difficulty urinating, genital sores, penile pain and testicular pain.  Musculoskeletal: Negative for myalgias, back pain, joint swelling, arthralgias and gait problem.  Skin: Negative for color change, pallor, rash and wound.  Neurological: Negative for dizziness, tremors, seizures, syncope, facial asymmetry, speech difficulty, weakness, light-headedness, numbness and headaches.    Hematological: Negative for adenopathy. Does not bruise/bleed easily.  Psychiatric/Behavioral: Negative for suicidal ideas, hallucinations, behavioral problems, confusion, sleep disturbance, self-injury, dysphoric mood, decreased concentration and agitation. The patient is not nervous/anxious and is not hyperactive.        Objective:   Physical Exam  Constitutional: He is oriented to person, place, and time. He appears well-developed and well-nourished. No distress.  HENT:  Head: Normocephalic and atraumatic.  Right Ear: External ear normal.  Left Ear: External ear normal.  Nose: Nose normal.  Mouth/Throat: Oropharynx is clear and moist. No oropharyngeal exudate.  Eyes: Conjunctivae and EOM are normal. Pupils are equal, round, and reactive to light. Right eye exhibits no discharge. Left eye exhibits no discharge. No scleral icterus.  Neck: Normal range of motion. Neck supple. No JVD present. No tracheal deviation present. No thyromegaly present.  Cardiovascular: Normal rate, regular rhythm, normal heart sounds and intact distal pulses.   Pulmonary/Chest: Effort normal and breath sounds normal. No stridor. No respiratory distress. He has no wheezes. He has no rales. He exhibits no tenderness.  Abdominal: Soft. Bowel sounds are normal. He exhibits no distension and no mass. There is no tenderness. There is no rebound and no guarding.  Genitourinary: Rectum normal and penis normal. No penile tenderness.  Musculoskeletal: Normal range of motion. He exhibits no edema and no tenderness.  Lymphadenopathy:       Head (right side): No submental, no submandibular and no occipital adenopathy present.       Head (left side): No submental, no submandibular and no occipital adenopathy present.    He has no cervical adenopathy.    He has no axillary adenopathy.       Right: Inguinal adenopathy present. No supraclavicular and no epitrochlear adenopathy present.       Left: Inguinal adenopathy present.  No supraclavicular and no epitrochlear adenopathy present.  Neurological: He is alert  and oriented to person, place, and time. He has normal reflexes. No cranial nerve deficit. He exhibits normal muscle tone. Coordination normal.  Skin: Skin is warm and dry. No rash noted. He is not diaphoretic. No erythema. No pallor.  Psychiatric: He has a normal mood and affect. His behavior is normal. Judgment and thought content normal.          Assessment & Plan:  1. HIV. Labs obtained 04/08/2010. Show a CD4 count of 1180 at 37% with a viral load of 60 copies per mL. Given that he is antiretroviral nave, he is showing distinct signs of a poorly fit virus in good virologic control. Based on current guidelines, he is not fit criteria for antiretroviral treatment. However, he would recommend a further evaluation by research team to determine if he qualifies for the start study or the 5303 study. He expressed interest in some form of treatment and also willingness to discuss research. Possibilities with the research team. We will refer him to Deirdre Evener for evaluation and discussion of research. Possibilities. Followup then will be coordinated with the research team. Should he decide to be either in research or routinely followed in clinic. He verbally acknowledged all this information and agreed with plan of care.

## 2011-01-09 ENCOUNTER — Emergency Department (HOSPITAL_COMMUNITY): Payer: Self-pay

## 2011-01-09 ENCOUNTER — Emergency Department (HOSPITAL_COMMUNITY)
Admission: EM | Admit: 2011-01-09 | Discharge: 2011-01-09 | Disposition: A | Discharge status: Home / Self Care | Payer: Self-pay | Attending: Emergency Medicine | Admitting: Emergency Medicine

## 2011-01-09 DIAGNOSIS — M79609 Pain in unspecified limb: Secondary | ICD-10-CM | POA: Insufficient documentation

## 2011-01-09 DIAGNOSIS — Z21 Asymptomatic human immunodeficiency virus [HIV] infection status: Secondary | ICD-10-CM | POA: Insufficient documentation

## 2011-01-09 DIAGNOSIS — R404 Transient alteration of awareness: Secondary | ICD-10-CM | POA: Insufficient documentation

## 2011-01-09 DIAGNOSIS — F411 Generalized anxiety disorder: Secondary | ICD-10-CM | POA: Insufficient documentation

## 2011-01-09 DIAGNOSIS — J45909 Unspecified asthma, uncomplicated: Secondary | ICD-10-CM | POA: Insufficient documentation

## 2011-01-09 DIAGNOSIS — M7989 Other specified soft tissue disorders: Secondary | ICD-10-CM

## 2011-01-09 DIAGNOSIS — F1010 Alcohol abuse, uncomplicated: Secondary | ICD-10-CM | POA: Insufficient documentation

## 2011-01-09 LAB — URINALYSIS, ROUTINE W REFLEX MICROSCOPIC
Leukocytes, UA: NEGATIVE
Nitrite: NEGATIVE
Specific Gravity, Urine: 1.008 (ref 1.005–1.030)
pH: 5.5 (ref 5.0–8.0)

## 2011-01-09 LAB — COMPREHENSIVE METABOLIC PANEL
Albumin: 3.8 g/dL (ref 3.5–5.2)
BUN: 11 mg/dL (ref 6–23)
Chloride: 104 mEq/L (ref 96–112)
Creatinine, Ser: 0.8 mg/dL (ref 0.50–1.35)
GFR calc Af Amer: >90 mL/min (ref >90)
GFR calc non Af Amer: >90 mL/min (ref >90)
Glucose, Bld: 89 mg/dL (ref 70–99)
Total Bilirubin: 0.3 mg/dL (ref 0.3–1.2)

## 2011-01-09 LAB — CBC
HCT: 40.7 % (ref 39.0–52.0)
MCH: 30.9 pg (ref 26.0–34.0)
MCV: 91.9 fL (ref 78.0–100.0)
Platelets: 311 10*3/uL (ref 150–400)
RBC: 4.43 MIL/uL (ref 4.22–5.81)

## 2011-01-09 LAB — SALICYLATE LEVEL: Salicylate Lvl: <2 mg/dL — ABNORMAL LOW (ref 2.8–20.0)

## 2011-01-09 LAB — RAPID URINE DRUG SCREEN, HOSP PERFORMED
Benzodiazepines: NOT DETECTED
Cocaine: NOT DETECTED

## 2011-01-09 LAB — DIFFERENTIAL
Eosinophils Absolute: 0.3 10*3/uL (ref 0.0–0.7)
Eosinophils Relative: 6 % — ABNORMAL HIGH (ref 0–5)
Lymphs Abs: 2.9 10*3/uL (ref 0.7–4.0)
Monocytes Relative: 7 % (ref 3–12)
Neutrophils Relative %: 28 % — ABNORMAL LOW (ref 43–77)

## 2011-01-09 LAB — ETHANOL: Alcohol, Ethyl (B): 257 mg/dL — ABNORMAL HIGH (ref 0–11)

## 2011-01-09 LAB — GLUCOSE, CAPILLARY: Glucose-Capillary: 85 mg/dL (ref 70–99)

## 2011-01-12 ENCOUNTER — Encounter: Payer: Self-pay | Admitting: *Deleted

## 2011-01-12 ENCOUNTER — Ambulatory Visit (INDEPENDENT_AMBULATORY_CARE_PROVIDER_SITE_OTHER): Payer: Self-pay | Admitting: *Deleted

## 2011-01-12 VITALS — BP 137/87 | HR 76 | Temp 98.5°F | Resp 16 | Wt 182.8 lb

## 2011-01-12 DIAGNOSIS — B20 Human immunodeficiency virus [HIV] disease: Secondary | ICD-10-CM

## 2011-01-12 LAB — POCT URINALYSIS DIPSTICK
Blood, UA: NEGATIVE
Nitrite, UA: NEGATIVE
Spec Grav, UA: 1.025
Urobilinogen, UA: 0.2
pH, UA: 5.5

## 2011-01-12 LAB — BASIC METABOLIC PANEL
BUN: 17 mg/dL (ref 6–23)
Calcium: 9.5 mg/dL (ref 8.4–10.5)
Creat: 0.97 mg/dL (ref 0.50–1.35)
Glucose, Bld: 82 mg/dL (ref 70–99)
Potassium: 4.4 mEq/L (ref 3.5–5.3)

## 2011-01-12 LAB — CD4/CD8 (T-HELPER/T-SUPPRESSOR CELL)
CD4%: 33.4
CD4: 534
CD8 % Suppressor T Cell: 50.9
CD8: 814
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
## 2011-01-14 LAB — HIV-1 RNA QUANT-NO REFLEX-BLD
HIV 1 RNA Quant: 545 copies/mL — ABNORMAL HIGH (ref <20)
HIV-1 RNA Quant, Log: 2.74 {Log} — ABNORMAL HIGH (ref <1.30)

## 2011-02-10 ENCOUNTER — Encounter (HOSPITAL_COMMUNITY): Payer: Self-pay

## 2011-02-10 ENCOUNTER — Emergency Department (HOSPITAL_COMMUNITY): Payer: Self-pay

## 2011-02-10 ENCOUNTER — Emergency Department (HOSPITAL_COMMUNITY)
Admission: EM | Admit: 2011-02-10 | Discharge: 2011-02-10 | Disposition: A | Discharge status: Home / Self Care | Payer: Self-pay | Attending: Emergency Medicine | Admitting: Emergency Medicine

## 2011-02-10 DIAGNOSIS — J45901 Unspecified asthma with (acute) exacerbation: Secondary | ICD-10-CM | POA: Insufficient documentation

## 2011-02-10 DIAGNOSIS — R0789 Other chest pain: Secondary | ICD-10-CM | POA: Insufficient documentation

## 2011-02-10 DIAGNOSIS — F17200 Nicotine dependence, unspecified, uncomplicated: Secondary | ICD-10-CM | POA: Insufficient documentation

## 2011-02-10 DIAGNOSIS — R0602 Shortness of breath: Secondary | ICD-10-CM | POA: Insufficient documentation

## 2011-02-10 MED ORDER — ALBUTEROL SULFATE (5 MG/ML) 0.5% IN NEBU
5.0000 mg | INHALATION_SOLUTION | Freq: Once | RESPIRATORY_TRACT | Status: AC
  Administered 2011-02-10: 5 mg via RESPIRATORY_TRACT
  Filled 2011-02-10: qty 0.5

## 2011-02-10 MED ORDER — IPRATROPIUM BROMIDE 0.02 % IN SOLN
0.5000 mg | Freq: Once | RESPIRATORY_TRACT | Status: AC
  Administered 2011-02-10: 0.5 mg via RESPIRATORY_TRACT
  Filled 2011-02-10: qty 2.5

## 2011-02-10 MED ORDER — METHYLPREDNISOLONE SODIUM SUCC 125 MG IJ SOLR
INTRAMUSCULAR | Status: AC
  Administered 2011-02-10: 11:00:00
  Filled 2011-02-10: qty 2

## 2011-02-10 MED ORDER — ALBUTEROL SULFATE HFA 108 (90 BASE) MCG/ACT IN AERS
1.0000 | INHALATION_SPRAY | Freq: Once | RESPIRATORY_TRACT | Status: AC
  Administered 2011-02-10: 2 via RESPIRATORY_TRACT
  Filled 2011-02-10: qty 6.7

## 2011-02-10 MED ORDER — ALBUTEROL SULFATE (5 MG/ML) 0.5% IN NEBU
INHALATION_SOLUTION | RESPIRATORY_TRACT | Status: AC
  Administered 2011-02-10: 5 mg via RESPIRATORY_TRACT
  Filled 2011-02-10: qty 2

## 2011-02-10 MED ORDER — PREDNISONE 20 MG PO TABS: 40.0000 mg | ORAL_TABLET | Freq: Every day | ORAL | Status: DC

## 2011-02-10 MED ORDER — IPRATROPIUM BROMIDE 0.02 % IN SOLN
RESPIRATORY_TRACT | Status: AC
  Administered 2011-02-10: 0.5 mg via RESPIRATORY_TRACT
  Filled 2011-02-10: qty 2.5

## 2011-02-10 MED ORDER — ALBUTEROL SULFATE HFA 108 (90 BASE) MCG/ACT IN AERS: 1.0000 | INHALATION_SPRAY | Freq: Once | RESPIRATORY_TRACT | Status: DC

## 2011-02-10 NOTE — ED Notes (Signed)
JWJ:[id number]<NW> Expected date:02/10/11<BR> Expected time:10:30 AM<BR> Means of arrival:Ambulance<BR> Comments:<BR> SOB

## 2011-02-10 NOTE — ED Notes (Signed)
Pt care assumed, obtained verbal report.  Pt resting comfortably, reports feeling much better.  Lung sounds clear in bila lobes with wheezing in R upper lobe.  Drink given to pt.  Tolerating it well.  Pt denies any pain at this time.

## 2011-02-10 NOTE — ED Notes (Signed)
No complaints at present no resp distress noted

## 2011-02-10 NOTE — ED Provider Notes (Signed)
History     CSN: [id number] Arrival date & time: 02/10/2011 10:33 AM   First MD Initiated Contact with Patient 02/10/11 1034      Chief Complaint  Patient presents with  . Shortness of Breath    (Consider location/radiation/quality/duration/timing/severity/associated sxs/prior treatment) HPI Comments: Patient states he is having an acute asthma exacerbation and has been out of his albuterol inhaler.  He describes having shortness of breath, but denies fevers, night sweats, chills, chest pain, cough, and flulike symptoms.  Patient has no other symptoms.  Patient is a 40 y.o. male presenting with shortness of breath. The history is provided by the patient.  Shortness of Breath  Associated symptoms include shortness of breath and wheezing. Pertinent negatives include no chest pain, no fever, no rhinorrhea, no sore throat, no stridor and no cough.    Past Medical History  Diagnosis Date  . Asthma     History reviewed. No pertinent past surgical history.  History reviewed. No pertinent family history.  History  Substance Use Topics  . Smoking status: Current Everyday Smoker -- 0.5 packs/day    Types: Cigarettes  . Smokeless tobacco: Never Used  . Alcohol Use: 3.0 - 3.5 oz/week    6-7 drink(s) per week      Review of Systems  Constitutional: Negative for fever, chills and diaphoresis.  HENT: Negative for sore throat, rhinorrhea, drooling, trouble swallowing, neck stiffness, voice change and sinus pressure.   Eyes: Negative for visual disturbance.  Respiratory: Positive for chest tightness, shortness of breath and wheezing. Negative for cough, choking and stridor.   Cardiovascular: Negative for chest pain and palpitations.  Gastrointestinal: Negative for abdominal pain.  Genitourinary: Negative for dysuria.  Musculoskeletal: Negative for back pain and gait problem.  Skin: Negative for rash.  Neurological: Negative for syncope, speech difficulty, light-headedness and  headaches.  Psychiatric/Behavioral: Negative for confusion.  All other systems reviewed and are negative.    Allergies  Review of patient's allergies indicates no known allergies.  Home Medications   Current Outpatient Rx  Name Route Sig Dispense Refill  . ALBUTEROL SULFATE HFA 108 (90 BASE) MCG/ACT IN AERS Inhalation Inhale 2 puffs into the lungs every 6 (six) hours as needed.        SpO2 96%  Physical Exam  Constitutional: He is oriented to person, place, and time. He appears well-developed and well-nourished. No distress.  HENT:  Head: Normocephalic and atraumatic.  Eyes: Conjunctivae and EOM are normal. Pupils are equal, round, and reactive to light.  Neck: Normal range of motion.  Cardiovascular: Normal rate, regular rhythm, normal heart sounds and intact distal pulses.   Pulmonary/Chest: Effort normal. He has wheezes. He exhibits tenderness.  Abdominal: Soft. There is no tenderness.  Musculoskeletal: Normal range of motion. He exhibits no edema and no tenderness.  Neurological: He is alert and oriented to person, place, and time.  Skin: Skin is warm and dry. No rash noted. He is not diaphoretic.  Psychiatric: He has a normal mood and affect. His behavior is normal.    ED Course  Procedures (including critical care time)  Labs Reviewed - No data to display No results found.   No diagnosis found.  Patient had decreased wheezing after third nebulizer treatment.  He states he feels he is at his baseline and able to go home with an albuterol inhaler.  Patient states he will followup with his doctor in regards to his acute asthma exacerbation.   CXR shows mild bronchitis. Discussed w pt and  informed him to follow up with PCP if becomes more symptomatic. Likely viral etiology  MDM  Asthma exacerbation        Jaci Carrel, [place] 02/10/11 1301

## 2011-02-10 NOTE — ED Notes (Signed)
Pt c/o SOB, out of inhaler

## 2011-02-12 NOTE — ED Provider Notes (Signed)
Medical screening examination/treatment/procedure(s) were performed by non-physician practitioner and as supervising physician I was immediately available for consultation/collaboration.   Jaedan Schuman A. Patrica Duel, MD 02/12/11 1243

## 2011-02-13 ENCOUNTER — Encounter (HOSPITAL_COMMUNITY): Payer: Self-pay | Admitting: *Deleted

## 2011-02-13 DIAGNOSIS — R0609 Other forms of dyspnea: Secondary | ICD-10-CM | POA: Insufficient documentation

## 2011-02-13 DIAGNOSIS — R5381 Other malaise: Secondary | ICD-10-CM | POA: Insufficient documentation

## 2011-02-13 DIAGNOSIS — J45909 Unspecified asthma, uncomplicated: Secondary | ICD-10-CM | POA: Insufficient documentation

## 2011-02-13 DIAGNOSIS — M546 Pain in thoracic spine: Secondary | ICD-10-CM | POA: Insufficient documentation

## 2011-02-13 DIAGNOSIS — S2239XA Fracture of one rib, unspecified side, initial encounter for closed fracture: Secondary | ICD-10-CM | POA: Insufficient documentation

## 2011-02-13 DIAGNOSIS — R079 Chest pain, unspecified: Secondary | ICD-10-CM | POA: Insufficient documentation

## 2011-02-13 DIAGNOSIS — R0602 Shortness of breath: Secondary | ICD-10-CM | POA: Insufficient documentation

## 2011-02-13 DIAGNOSIS — S298XXA Other specified injuries of thorax, initial encounter: Secondary | ICD-10-CM | POA: Insufficient documentation

## 2011-02-13 DIAGNOSIS — R0989 Other specified symptoms and signs involving the circulatory and respiratory systems: Secondary | ICD-10-CM | POA: Insufficient documentation
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
## 2011-02-14 ENCOUNTER — Emergency Department (HOSPITAL_COMMUNITY)
Admission: EM | Admit: 2011-02-14 | Discharge: 2011-02-14 | Disposition: A | Discharge status: Home / Self Care | Payer: Self-pay | Attending: Emergency Medicine | Admitting: Emergency Medicine

## 2011-02-14 ENCOUNTER — Emergency Department (HOSPITAL_COMMUNITY): Payer: Self-pay

## 2011-02-14 DIAGNOSIS — R0602 Shortness of breath: Secondary | ICD-10-CM | POA: Insufficient documentation

## 2011-02-14 DIAGNOSIS — R0682 Tachypnea, not elsewhere classified: Secondary | ICD-10-CM | POA: Insufficient documentation

## 2011-02-14 DIAGNOSIS — R059 Cough, unspecified: Secondary | ICD-10-CM | POA: Insufficient documentation

## 2011-02-14 DIAGNOSIS — R079 Chest pain, unspecified: Secondary | ICD-10-CM | POA: Insufficient documentation

## 2011-02-14 DIAGNOSIS — J45901 Unspecified asthma with (acute) exacerbation: Secondary | ICD-10-CM | POA: Insufficient documentation

## 2011-02-14 DIAGNOSIS — R05 Cough: Secondary | ICD-10-CM | POA: Insufficient documentation

## 2011-02-14 DIAGNOSIS — S2239XA Fracture of one rib, unspecified side, initial encounter for closed fracture: Secondary | ICD-10-CM | POA: Insufficient documentation

## 2011-02-14 MED ORDER — HYDROMORPHONE HCL PF 2 MG/ML IJ SOLN
2.0000 mg | Freq: Once | INTRAMUSCULAR | Status: AC
  Administered 2011-02-14: 2 mg via INTRAMUSCULAR
  Filled 2011-02-14: qty 1

## 2011-02-14 MED ORDER — IPRATROPIUM BROMIDE 0.02 % IN SOLN
0.5000 mg | Freq: Once | RESPIRATORY_TRACT | Status: AC
  Administered 2011-02-14: 0.5 mg via RESPIRATORY_TRACT
  Filled 2011-02-14: qty 2.5

## 2011-02-14 MED ORDER — OXYCODONE-ACETAMINOPHEN 5-325 MG PO TABS
1.0000 | ORAL_TABLET | Freq: Once | ORAL | Status: AC
  Administered 2011-02-14: 1 via ORAL
  Filled 2011-02-14: qty 1

## 2011-02-14 MED ORDER — ALBUTEROL SULFATE (5 MG/ML) 0.5% IN NEBU
5.0000 mg | INHALATION_SOLUTION | Freq: Once | RESPIRATORY_TRACT | Status: AC
  Administered 2011-02-14: 5 mg via RESPIRATORY_TRACT
  Filled 2011-02-14: qty 1

## 2011-02-14 MED ORDER — HYDROMORPHONE HCL PF 1 MG/ML IJ SOLN
1.0000 mg | Freq: Once | INTRAMUSCULAR | Status: AC
  Administered 2011-02-14: 1 mg via INTRAMUSCULAR
  Filled 2011-02-14: qty 1

## 2011-02-14 MED ORDER — ALBUTEROL SULFATE (5 MG/ML) 0.5% IN NEBU
5.0000 mg | INHALATION_SOLUTION | Freq: Once | RESPIRATORY_TRACT | Status: AC
  Administered 2011-02-14: 5 mg via RESPIRATORY_TRACT

## 2011-02-14 MED ORDER — PREDNISONE 20 MG PO TABS: 40.0000 mg | ORAL_TABLET | Freq: Every day | ORAL | Status: AC

## 2011-02-14 MED ORDER — ALBUTEROL SULFATE HFA 108 (90 BASE) MCG/ACT IN AERS
2.0000 | INHALATION_SPRAY | RESPIRATORY_TRACT | Status: DC | PRN
  Administered 2011-02-14: 2 via RESPIRATORY_TRACT
  Filled 2011-02-14: qty 6.7

## 2011-02-14 MED ORDER — ONDANSETRON 4 MG PO TBDP
4.0000 mg | ORAL_TABLET | Freq: Once | ORAL | Status: AC
  Administered 2011-02-14: 4 mg via ORAL
  Filled 2011-02-14: qty 1

## 2011-02-14 MED ORDER — PREDNISONE 20 MG PO TABS
60.0000 mg | ORAL_TABLET | Freq: Once | ORAL | Status: AC
  Administered 2011-02-14: 60 mg via ORAL
  Filled 2011-02-14: qty 3

## 2011-02-14 MED ORDER — KETOROLAC TROMETHAMINE 30 MG/ML IJ SOLN
30.0000 mg | Freq: Once | INTRAMUSCULAR | Status: AC
  Administered 2011-02-14: 30 mg via INTRAVENOUS
  Filled 2011-02-14: qty 1

## 2011-02-14 MED ORDER — OXYCODONE-ACETAMINOPHEN 5-325 MG PO TABS: 2.0000 | ORAL_TABLET | ORAL | Status: DC | PRN

## 2011-02-14 MED ORDER — IPRATROPIUM BROMIDE 0.02 % IN SOLN
0.5000 mg | Freq: Once | RESPIRATORY_TRACT | Status: AC
  Administered 2011-02-14: 0.5 mg via RESPIRATORY_TRACT
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
## 2011-02-14 NOTE — ED Provider Notes (Signed)
Medical screening examination/treatment/procedure(s) were performed by non-physician practitioner and as supervising physician I was immediately available for consultation/collaboration.  Dericka Ostenson, MD 02/14/11 0710 

## 2011-02-14 NOTE — ED Notes (Signed)
Patient is resting comfortably. Pt's breathing has improved.  Pt states that he is feeling better.

## 2011-02-14 NOTE — ED Notes (Signed)
MD at bedside. 
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
## 2011-02-14 NOTE — ED Notes (Signed)
Pt has been discharged and waiting in the waiting room for a ride.  Pt walked to [place] and had an increase in SOB with same.  Pt has audible wheezing and unable to speak complete sentences.

## 2011-02-15 ENCOUNTER — Encounter (HOSPITAL_COMMUNITY): Payer: Self-pay | Admitting: Emergency Medicine

## 2011-02-15 ENCOUNTER — Emergency Department (HOSPITAL_COMMUNITY)
Admission: EM | Admit: 2011-02-15 | Discharge: 2011-02-16 | Disposition: A | Discharge status: Home / Self Care | Payer: Self-pay | Attending: Emergency Medicine | Admitting: Emergency Medicine

## 2011-02-15 DIAGNOSIS — F17200 Nicotine dependence, unspecified, uncomplicated: Secondary | ICD-10-CM | POA: Insufficient documentation

## 2011-02-15 DIAGNOSIS — S2239XA Fracture of one rib, unspecified side, initial encounter for closed fracture: Secondary | ICD-10-CM | POA: Insufficient documentation

## 2011-02-15 DIAGNOSIS — R079 Chest pain, unspecified: Secondary | ICD-10-CM | POA: Insufficient documentation

## 2011-02-15 DIAGNOSIS — J45909 Unspecified asthma, uncomplicated: Secondary | ICD-10-CM | POA: Insufficient documentation
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
## 2011-02-16 MED ORDER — OXYCODONE-ACETAMINOPHEN 5-325 MG PO TABS
2.0000 | ORAL_TABLET | Freq: Once | ORAL | Status: AC
  Administered 2011-02-16: 2 via ORAL
  Filled 2011-02-16: qty 2
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
## 2011-03-14 ENCOUNTER — Encounter (HOSPITAL_COMMUNITY): Payer: Self-pay | Admitting: *Deleted

## 2011-03-14 ENCOUNTER — Emergency Department (HOSPITAL_COMMUNITY): Payer: Self-pay

## 2011-03-14 ENCOUNTER — Emergency Department (HOSPITAL_COMMUNITY)
Admission: EM | Admit: 2011-03-14 | Discharge: 2011-03-14 | Payer: Self-pay | Attending: Emergency Medicine | Admitting: Emergency Medicine

## 2011-03-14 DIAGNOSIS — G8929 Other chronic pain: Secondary | ICD-10-CM | POA: Insufficient documentation

## 2011-03-14 DIAGNOSIS — R51 Headache: Secondary | ICD-10-CM | POA: Insufficient documentation

## 2011-03-14 DIAGNOSIS — M549 Dorsalgia, unspecified: Secondary | ICD-10-CM | POA: Insufficient documentation

## 2011-03-14 DIAGNOSIS — F1010 Alcohol abuse, uncomplicated: Secondary | ICD-10-CM | POA: Insufficient documentation

## 2011-03-14 DIAGNOSIS — F10929 Alcohol use, unspecified with intoxication, unspecified: Secondary | ICD-10-CM

## 2011-03-14 DIAGNOSIS — Z21 Asymptomatic human immunodeficiency virus [HIV] infection status: Secondary | ICD-10-CM | POA: Insufficient documentation

## 2011-03-14 DIAGNOSIS — R269 Unspecified abnormalities of gait and mobility: Secondary | ICD-10-CM | POA: Insufficient documentation

## 2011-03-14 DIAGNOSIS — F17200 Nicotine dependence, unspecified, uncomplicated: Secondary | ICD-10-CM | POA: Insufficient documentation

## 2011-03-14 DIAGNOSIS — W19XXXA Unspecified fall, initial encounter: Secondary | ICD-10-CM | POA: Insufficient documentation

## 2011-03-14 HISTORY — DX: Human immunodeficiency virus (HIV) disease: B20

## 2011-03-14 HISTORY — DX: Asymptomatic human immunodeficiency virus (hiv) infection status: Z21

## 2011-03-14 LAB — COMPREHENSIVE METABOLIC PANEL
AST: 32 U/L (ref 0–37)
Alkaline Phosphatase: 106 U/L (ref 39–117)
CO2: 21 mEq/L (ref 19–32)
Chloride: 101 mEq/L (ref 96–112)
Creatinine, Ser: 0.83 mg/dL (ref 0.50–1.35)
GFR calc non Af Amer: >90 mL/min (ref >90)
Total Bilirubin: 0.6 mg/dL (ref 0.3–1.2)

## 2011-03-14 LAB — CBC
MCH: 30 pg (ref 26.0–34.0)
MCHC: 34.1 g/dL (ref 30.0–36.0)
MCV: 88 fL (ref 78.0–100.0)
Platelets: 347 10*3/uL (ref 150–400)
RDW: 13.3 % (ref 11.5–15.5)
WBC: 5.5 10*3/uL (ref 4.0–10.5)

## 2011-03-14 MED ORDER — IBUPROFEN 800 MG PO TABS: 800.0000 mg | ORAL_TABLET | Freq: Once | ORAL | Status: DC
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
## 2011-03-14 NOTE — ED Notes (Signed)
QMV:[id number]<NG> Expected date:03/14/11<BR> Expected time: 5:59 AM<BR> Means of arrival:Ambulance<BR> Comments:<BR> ETOH
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
## 2011-03-14 NOTE — ED Notes (Signed)
Pt reports drinking all day and night. Wanted to go to [facility] but could not because of drinking etoh all night. Requested to come to the ER rather than be on the streets. Pt smells of ETOH, has unsteady gait. Alert to self, bday, and place. EMS reports that pt ambulated to the EMS truck without difficulty, though appears to have some difficulty at this time.

## 2011-03-14 NOTE — Discharge Instructions (Signed)
CT Scans were Normal. Please refrain from Drinking Alcohol. Follow-up with your doctor for further treatment if needed.      Alcohol Intoxication You have alcohol intoxication when the amount of alcohol that you have consumed has impaired your ability to mentally and physically function. There are a variety of factors that contribute to the level at which alcohol intoxication can occur, such as age, gender, weight, frequency of alcohol consumption, medication use, and the presence of other medical conditions, such as diabetes, seizures, or heart conditions. The blood alcohol level test measures the concentration of alcohol in your blood. In most states, your blood alcohol level must be lower than 80 mg/dL (1.61%) to legally drive. However, many dangerous effects of alcohol can occur at much lower levels. Alcohol directly impairs the normal chemical activity of the brain and is said to be a chemical depressant. Alcohol can cause drowsiness, stupor, respiratory failure, and coma. Other physical effects can include headache, vomiting, vomiting of blood, abdominal pain, a fast heartbeat, difficulty breathing, anxiety, and amnesia. Alcohol intoxication can also lead to dangerous and life-threatening activities, such as fighting, dangerous operation of vehicles or heavy machinery, and risky sexual behavior. Alcohol can be especially dangerous when taken with other drugs. Some of these drugs are:  Sedatives.   Painkillers.   Marijuana.   Tranquilizers.   Antihistamines.   Muscle relaxants.   Seizure medicine.  Many of the effects of acute alcohol intoxication are temporary. However, repeated alcohol intoxication can lead to severe medical illnesses. If you have alcohol intoxication, you should:  Stay hydrated. Drink enough water and fluids to keep your urine clear or pale yellow. Avoid excessive caffeine because this can further lead to dehydration.   Eat a healthy diet. You may have residual  nausea, headache, and loss of appetite, but it is still important that you maintain good nutrition. You can start with clear liquids.   Take nonsteroidal anti-inflammatory medications as needed for headaches, but make sure to do so with small meals. You should avoid acetaminophen for several days after having alcohol intoxication because the combination of alcohol and acetaminophen can be toxic to your liver.  If you have frequent alcohol intoxication, ask your friends and family if they think you have a drinking problem. For further help, contact:  Your caregiver.   Alcoholics Anonymous (AA).   A drug or alcohol rehabilitation program.  SEEK MEDICAL CARE IF:   You have persistent vomiting.   You have persistent pain in any part of your body.   You do not feel better after a few days.  SEEK IMMEDIATE MEDICAL CARE IF:   You become shaky or tremble when you try to stop drinking.   You shake uncontrollably (seizure).   You throw up (vomit) blood. This may be bright red or it may look like black coffee grounds.   You have blood in the stool. This may be bright red or appear as a black, tarry, bad smelling stool.   You become lightheaded or faint.  ANY OF THESE SYMPTOMS MAY REPRESENT A SERIOUS PROBLEM THAT IS AN EMERGENCY. Do not wait to see if the symptoms will go away. Get medical help right away. Call your local emergency services (911 in [place] DO NOT drive yourself to the hospital. MAKE SURE YOU:   Understand these instructions.   Will watch your condition.   Will get help right away if you are not doing well or get worse.  Document Released: 12/09/2004 Document Revised: 11/11/2010 Document  Reviewed: 08/18/2009 [facility] Patient Information 2012 [place], [place].     Finding Treatment for Alcohol and Drug Addiction It can be hard to find the right place to get professional treatment. Here are some important things to consider:  There are different types of treatment to  choose from.   Some programs are live-in (residential) while others are not (outpatient). Sometimes a combination is offered.   No single type of program is right for everyone.   Most treatment programs involve a combination of education, counseling, and a 12-step, spiritually-based approach.   There are non-spiritually based programs (not 12-step).   Some treatment programs are government sponsored. They are geared for patients without private insurance.   Treatment programs can vary in many respects such as:   Cost and types of insurance accepted.   Types of on-site medical services offered.   Length of stay, setting, and size.   Overall philosophy of treatment.  A person may need specialized treatment or have needs not addressed by all programs. For example, adolescents need treatment appropriate for their age. Other people have secondary disorders that must be managed as well. Secondary conditions can include mental illness, such as depression or diabetes. Often, a period of detoxification from alcohol or drugs is needed. This requires medical supervision and not all programs offer this. THINGS TO CONSIDER WHEN SELECTING A TREATMENT PROGRAM   Is the program certified by the appropriate government agency? Even private programs must be certified and employ certified professionals.   Does the program accept your insurance? If not, can a payment plan be set up?   Is the facility clean, organized, and well run? Do they allow you to speak with graduates who can share their treatment experience with you? Can you tour the facility? Can you meet with staff?   Does the program meet the full range of individual needs?   Does the treatment program address sexual orientation and physical disabilities? Do they provide age, gender, and culturally appropriate treatment services?   Is treatment available in languages other than English?   Is long-term aftercare support or guidance encouraged  and provided?   Is assessment of an individual's treatment plan ongoing to ensure it meets changing needs?   Does the program use strategies to encourage reluctant patients to remain in treatment long enough to increase the likelihood of success?   Does the program offer counseling (individual or group) and other behavioral therapies?   Does the program offer medicine as part of the treatment regimen, if needed?   Is there ongoing monitoring of possible relapse? Is there a defined relapse prevention program? Are services or referrals offered to family members to ensure they understand addiction and the recovery process? This would help them support the recovering individual.   Are 12-step meetings held at the center or is transport available for patients to attend outside meetings?  In countries outside of the [name]. and [place], Magazine features editor for contact information for services in your area. Document Released: 01/28/2005 Document Revised: 11/11/2010 Document Reviewed: 08/10/2007 [facility] Patient Information 2012 [place], [place].

## 2011-03-14 NOTE — ED Notes (Signed)
Pt reports drinking "a little of this, a little of that" for the last 13 hrs. States he had [employer], [employer], and vodka.

## 2011-03-14 NOTE — ED Notes (Signed)
[employer] at bedside, pt dressed with assistance, awaiting transport to [facility] department facility

## 2011-03-14 NOTE — ED Notes (Signed)
Urine sent to Lab

## 2011-03-16 NOTE — ED Provider Notes (Signed)
Medical screening examination/treatment/procedure(s) were performed by non-physician practitioner and as supervising physician I was immediately available for consultation/collaboration.   Vida Roller, MD 03/16/11 [phone]

## 2011-03-28 ENCOUNTER — Encounter (HOSPITAL_COMMUNITY): Payer: Self-pay | Admitting: Emergency Medicine

## 2011-03-28 ENCOUNTER — Emergency Department (HOSPITAL_COMMUNITY): Payer: Self-pay

## 2011-03-28 ENCOUNTER — Emergency Department (HOSPITAL_COMMUNITY)
Admission: EM | Admit: 2011-03-28 | Discharge: 2011-03-28 | Disposition: A | Discharge status: Home / Self Care | Payer: Self-pay | Attending: Emergency Medicine | Admitting: Emergency Medicine

## 2011-03-28 DIAGNOSIS — J45901 Unspecified asthma with (acute) exacerbation: Secondary | ICD-10-CM | POA: Insufficient documentation

## 2011-03-28 DIAGNOSIS — R0602 Shortness of breath: Secondary | ICD-10-CM | POA: Insufficient documentation

## 2011-03-28 DIAGNOSIS — R079 Chest pain, unspecified: Secondary | ICD-10-CM | POA: Insufficient documentation

## 2011-03-28 DIAGNOSIS — R093 Abnormal sputum: Secondary | ICD-10-CM | POA: Insufficient documentation

## 2011-03-28 DIAGNOSIS — Z79899 Other long term (current) drug therapy: Secondary | ICD-10-CM | POA: Insufficient documentation

## 2011-03-28 DIAGNOSIS — R05 Cough: Secondary | ICD-10-CM | POA: Insufficient documentation

## 2011-03-28 DIAGNOSIS — R059 Cough, unspecified: Secondary | ICD-10-CM | POA: Insufficient documentation

## 2011-03-28 DIAGNOSIS — Z21 Asymptomatic human immunodeficiency virus [HIV] infection status: Secondary | ICD-10-CM | POA: Insufficient documentation

## 2011-03-28 DIAGNOSIS — R0989 Other specified symptoms and signs involving the circulatory and respiratory systems: Secondary | ICD-10-CM | POA: Insufficient documentation

## 2011-03-28 DIAGNOSIS — R0609 Other forms of dyspnea: Secondary | ICD-10-CM | POA: Insufficient documentation

## 2011-03-28 DIAGNOSIS — F17200 Nicotine dependence, unspecified, uncomplicated: Secondary | ICD-10-CM | POA: Insufficient documentation

## 2011-03-28 MED ORDER — ALBUTEROL SULFATE (5 MG/ML) 0.5% IN NEBU
2.5000 mg | INHALATION_SOLUTION | Freq: Once | RESPIRATORY_TRACT | Status: AC
  Administered 2011-03-28: 2.5 mg via RESPIRATORY_TRACT
  Filled 2011-03-28: qty 0.5

## 2011-03-28 MED ORDER — IPRATROPIUM BROMIDE 0.02 % IN SOLN
0.5000 mg | Freq: Once | RESPIRATORY_TRACT | Status: AC
  Administered 2011-03-28: 0.5 mg via RESPIRATORY_TRACT
  Filled 2011-03-28: qty 2.5

## 2011-03-28 MED ORDER — PREDNISONE 50 MG PO TABS: 50.0000 mg | ORAL_TABLET | Freq: Every day | ORAL | Status: DC

## 2011-03-28 MED ORDER — PREDNISONE 20 MG PO TABS
60.0000 mg | ORAL_TABLET | Freq: Once | ORAL | Status: AC
  Administered 2011-03-28: 60 mg via ORAL
  Filled 2011-03-28: qty 3

## 2011-03-28 MED ORDER — IPRATROPIUM BROMIDE 0.02 % IN SOLN
0.5000 mg | Freq: Once | RESPIRATORY_TRACT | Status: AC
Start: 2011-03-28 — End: 2011-03-28
  Administered 2011-03-28: 0.5 mg via RESPIRATORY_TRACT
  Filled 2011-03-28: qty 2.5

## 2011-03-28 MED ORDER — ALBUTEROL SULFATE HFA 108 (90 BASE) MCG/ACT IN AERS
2.0000 | INHALATION_SPRAY | RESPIRATORY_TRACT | Status: DC | PRN
  Administered 2011-03-28: 2 via RESPIRATORY_TRACT
  Filled 2011-03-28: qty 6.7

## 2011-03-28 NOTE — ED Notes (Signed)
[id number]<AV> Expected date:03/28/11<BR> Expected time: 3:20 AM<BR> Means of arrival:Ambulance<BR> Comments:<BR> Shortness of breath, ETOH

## 2011-03-28 NOTE — ED Provider Notes (Addendum)
History     CSN: [id number]  Arrival date & time 03/28/11  5621   First MD Initiated Contact with Patient 03/28/11 [phone]      Chief Complaint  Patient presents with  . Shortness of Breath    ETOH    (Consider location/radiation/quality/duration/timing/severity/associated sxs/prior treatment) Patient is a 41 y.o. male presenting with shortness of breath. The history is provided by the patient.  Shortness of Breath  Associated symptoms include shortness of breath.  He has been having a cough productive of gray sputum and progressive dyspnea over the last 2 days. He denies any fever or chills, but he has broken out in sweats. Denies any chest pain. He has been using his inhaler with only minimal relief of his dyspnea, and he says his inhaler ran out of medication 2 days ago. Nothing makes his breathing worse and albuterol only gave slight relief.  Past Medical History  Diagnosis Date  . Asthma   . HIV (human immunodeficiency virus infection)     History reviewed. No pertinent past surgical history.  History reviewed. No pertinent family history.  History  Substance Use Topics  . Smoking status: Current Everyday Smoker -- 0.5 packs/day    Types: Cigarettes  . Smokeless tobacco: Never Used  . Alcohol Use: 3.0 - 3.5 oz/week    6-7 drink(s) per week      Review of Systems  Respiratory: Positive for shortness of breath.   All other systems reviewed and are negative.    Allergies  Shellfish allergy  Home Medications   Current Outpatient Rx  Name Route Sig Dispense Refill  . ALBUTEROL SULFATE HFA 108 (90 BASE) MCG/ACT IN AERS Inhalation Inhale 2 puffs into the lungs 4 (four) times daily.     . OXYCODONE-ACETAMINOPHEN 5-325 MG PO TABS Oral Take 2 tablets by mouth every 4 (four) hours as needed.        BP 121/70  Pulse 83  Temp(Src) 97.4 F (36.3 C) (Oral)  Resp 18  SpO2 100%  Physical Exam  Vitals reviewed.  41 year old male who is resting comfortably and in no  acute distress. Vital signs are normal. Oxygen saturation is 100% which is normal. Head is normocephalic and atraumatic. PERRLA, EOMI. Oropharynx is clear. Neck is supple without adenopathy or JVD. Back is nontender. Lungs have diminished airflow with diffuse wheezes. No rales or rhonchi are heard. Heart has regular rate and rhythm without murmur. Abdomen is soft, flat, nontender without masses or hepatosplenomegaly. Extremities have no cyanosis or edema, full range of motion is present. Skin is warm and moist without rash. Neurologic: Mental status is normal, cranial nerves are intact, there no focal motor or sensory deficits.  ED Course  Procedures (including critical care time)  Labs Reviewed - No data to display No results found. Results for orders placed during the hospital encounter of 03/14/11  COMPREHENSIVE METABOLIC PANEL      Component Value Range   Sodium 138  135 - 145 (mEq/L)   Potassium 3.6  3.5 - 5.1 (mEq/L)   Chloride 101  96 - 112 (mEq/L)   CO2 21  19 - 32 (mEq/L)   Glucose, Bld 105 (*) 70 - 99 (mg/dL)   BUN 10  6 - 23 (mg/dL)   Creatinine, Ser 5.78  0.50 - 1.35 (mg/dL)   Calcium 8.9  8.4 - 46.9 (mg/dL)   Total Protein 7.7  6.0 - 8.3 (g/dL)   Albumin 4.3  3.5 - 5.2 (g/dL)   AST 32  0 - 37 (U/L)   ALT 18  0 - 53 (U/L)   Alkaline Phosphatase 106  39 - 117 (U/L)   Total Bilirubin 0.6  0.3 - 1.2 (mg/dL)   GFR calc non Af Amer >90  >90 (mL/min)   GFR calc Af Amer >90  >90 (mL/min)  ETHANOL      Component Value Range   Alcohol, Ethyl (B) 346 (*) 0 - 11 (mg/dL)  CBC      Component Value Range   WBC 5.5  4.0 - 10.5 (K/uL)   RBC 4.93  4.22 - 5.81 (MIL/uL)   Hemoglobin 14.8  13.0 - 17.0 (g/dL)   HCT 16.1  09.6 - 04.5 (%)   MCV 88.0  78.0 - 100.0 (fL)   MCH 30.0  26.0 - 34.0 (pg)   MCHC 34.1  30.0 - 36.0 (g/dL)   RDW 40.9  81.1 - 91.4 (%)   Platelets 347  150 - 400 (K/uL)  URINE RAPID DRUG SCREEN (HOSP PERFORMED)      Component Value Range   Opiates NONE DETECTED  NONE  DETECTED    Cocaine NONE DETECTED  NONE DETECTED    Benzodiazepines NONE DETECTED  NONE DETECTED    Amphetamines NONE DETECTED  NONE DETECTED    Tetrahydrocannabinol NONE DETECTED  NONE DETECTED    Barbiturates NONE DETECTED  NONE DETECTED    Dg Chest 2 View  03/28/2011  *RADIOLOGY REPORT*  Clinical Data: Chest pain, cough and fever.  CHEST - 2 VIEW  Comparison: Chest radiograph performed 02/14/2011  Findings: The lungs are well-aerated.  Chronic peribronchial thickening is noted.  There is no evidence of focal opacification, pleural effusion or pneumothorax.  The heart is normal in size; the mediastinal contour is within normal limits.  No acute osseous abnormalities are seen.  IMPRESSION: Chronic peribronchial thickening noted; lungs otherwise clear.  Original Report Authenticated By: Tonia Ghent, M.D.   Ct Head Wo Contrast  03/14/2011  *RADIOLOGY REPORT*  Clinical Data:  Intoxication.  Asthma.  HIV.  CT HEAD WITHOUT CONTRAST CT CERVICAL SPINE WITHOUT CONTRAST  Technique:  Multidetector CT imaging of the head and cervical spine was performed following the standard protocol without intravenous contrast.  Multiplanar CT image reconstructions of the cervical spine were also generated.  Comparison:  06/26/2010  CT HEAD  Findings: The brain stem, cerebellum, cerebral peduncles, thalami, basal ganglia, basilar cisterns, and ventricular system appear unremarkable.  No intracranial hemorrhage, mass lesion, or acute infarction is identified.  IMPRESSION:  No significant abnormality identified.  CT CERVICAL SPINE  Findings: No prevertebral soft tissue swelling is identified.  No cervical vertebral malalignment noted.  No cervical spine fracture is evident.  IMPRESSION:  1.  No cervical spine fracture or subluxation identified.  Original Report Authenticated By: Dellia Cloud, M.D.   Ct Cervical Spine Wo Contrast  03/14/2011  *RADIOLOGY REPORT*  Clinical Data:  Intoxication.  Asthma.  HIV.  CT HEAD  WITHOUT CONTRAST CT CERVICAL SPINE WITHOUT CONTRAST  Technique:  Multidetector CT imaging of the head and cervical spine was performed following the standard protocol without intravenous contrast.  Multiplanar CT image reconstructions of the cervical spine were also generated.  Comparison:  06/26/2010  CT HEAD  Findings: The brain stem, cerebellum, cerebral peduncles, thalami, basal ganglia, basilar cisterns, and ventricular system appear unremarkable.  No intracranial hemorrhage, mass lesion, or acute infarction is identified.  IMPRESSION:  No significant abnormality identified.  CT CERVICAL SPINE  Findings: No prevertebral soft tissue swelling  is identified.  No cervical vertebral malalignment noted.  No cervical spine fracture is evident.  IMPRESSION:  1.  No cervical spine fracture or subluxation identified.  Original Report Authenticated By: Dellia Cloud, M.D.      1. Asthma     He was given 1 albuterol nebulizer treatment with significant improvement. There was still faint wheezing on reexamination. He is given a second albuterol nebulizer treatment and lungs are clear. He is currently sleeping quietly.  MDM  Exacerbation of asthma, rule out pneumonia. Prior ED records have been reviewed and he has prior visits for asthma exacerbations.        Dione Booze, MD 03/28/11 [id number]  Dione Booze, MD 03/28/11 1191  Dione Booze, MD 03/28/11 4782  Dione Booze, MD 03/28/11 [phone]

## 2011-03-28 NOTE — ED Notes (Signed)
Per EMS: pt was found on sidewalk when ems arrived. Pt c/o SOB and states he has been drinking tonight.

## 2011-03-28 NOTE — ED Notes (Signed)
Pt alert and oriented x4. Respirations even and unlabored, bilateral symmetrical rise and fall of chest. Skin warm and dry. In no acute distress. Denies needs. Ambulatory. Verbalized understanding discharge instructions.

## 2011-04-07 ENCOUNTER — Encounter (HOSPITAL_COMMUNITY): Payer: Self-pay | Admitting: Family Medicine

## 2011-04-07 ENCOUNTER — Emergency Department (HOSPITAL_COMMUNITY)
Admission: EM | Admit: 2011-04-07 | Discharge: 2011-04-07 | Disposition: A | Discharge status: Home / Self Care | Payer: Self-pay | Attending: Emergency Medicine | Admitting: Emergency Medicine

## 2011-04-07 DIAGNOSIS — Z21 Asymptomatic human immunodeficiency virus [HIV] infection status: Secondary | ICD-10-CM | POA: Insufficient documentation

## 2011-04-07 DIAGNOSIS — R0789 Other chest pain: Secondary | ICD-10-CM | POA: Insufficient documentation

## 2011-04-07 DIAGNOSIS — J45901 Unspecified asthma with (acute) exacerbation: Secondary | ICD-10-CM

## 2011-04-07 DIAGNOSIS — F17200 Nicotine dependence, unspecified, uncomplicated: Secondary | ICD-10-CM | POA: Insufficient documentation

## 2011-04-07 MED ORDER — ALBUTEROL SULFATE (5 MG/ML) 0.5% IN NEBU
2.5000 mg | INHALATION_SOLUTION | Freq: Once | RESPIRATORY_TRACT | Status: AC
  Administered 2011-04-07: 2.5 mg via RESPIRATORY_TRACT
  Filled 2011-04-07: qty 0.5

## 2011-04-07 MED ORDER — PREDNISONE 20 MG PO TABS: 60.0000 mg | ORAL_TABLET | Freq: Every day | ORAL | Status: AC

## 2011-04-07 MED ORDER — PREDNISONE 20 MG PO TABS
60.0000 mg | ORAL_TABLET | Freq: Once | ORAL | Status: AC
  Administered 2011-04-07: 60 mg via ORAL
  Filled 2011-04-07: qty 3

## 2011-04-07 MED ORDER — ALBUTEROL SULFATE HFA 108 (90 BASE) MCG/ACT IN AERS: 2.0000 | INHALATION_SPRAY | RESPIRATORY_TRACT | Status: DC | PRN

## 2011-04-07 MED ORDER — ALBUTEROL SULFATE (5 MG/ML) 0.5% IN NEBU: 2.5000 mg | INHALATION_SOLUTION | Freq: Once | RESPIRATORY_TRACT | Status: DC

## 2011-04-07 NOTE — ED Notes (Signed)
Neb trt in progress 

## 2011-04-07 NOTE — ED Notes (Signed)
Sleeping on carrier---awakened for discharge---Reports feeling 100% better--

## 2011-04-07 NOTE — ED Notes (Signed)
Patient states he started having shortness of breath and difficulty breathing around 0430. Tried Albuterol inhaler approx 7 times without relief.

## 2011-04-07 NOTE — ED Notes (Signed)
Expiratory wheezes over all fields slightly diminished on the right posterior---pulse ox 98%--States he has used his MDI 7 times but, no relief.

## 2011-04-07 NOTE — ED Provider Notes (Signed)
History     CSN: [id number]  Arrival date & time 04/07/11  8657   First MD Initiated Contact with Patient 04/07/11 [phone]      No chief complaint on file.   (Consider location/radiation/quality/duration/timing/severity/associated sxs/prior treatment) The history is provided by the patient.   wheezing with history of asthma. Patient is unable say what his triggers are. He does smoke cigarettes. Complains of chest tightness and difficulty breathing and has ran out of his albuterol inhaler. He denies any recent cough or fevers. He denies any recent illness. No rashes. No recent travel. No new exposures. Onset yesterday and worsening. Moderate to severe. No associated nausea vomiting diarrhea. Patient states he has never had to be admitted for asthma and has never required intubation.  Past Medical History  Diagnosis Date  . Asthma   . HIV (human immunodeficiency virus infection)     No past surgical history on file.  No family history on file.  History  Substance Use Topics  . Smoking status: Current Everyday Smoker -- 0.5 packs/day    Types: Cigarettes  . Smokeless tobacco: Never Used  . Alcohol Use: 3.0 - 3.5 oz/week    6-7 drink(s) per week      Review of Systems  Constitutional: Negative for fever and chills.  HENT: Negative for neck pain and neck stiffness.   Eyes: Negative for pain.  Respiratory: Positive for chest tightness and wheezing. Negative for shortness of breath.   Cardiovascular: Negative for palpitations and leg swelling.  Gastrointestinal: Negative for abdominal pain.  Genitourinary: Negative for dysuria.  Musculoskeletal: Negative for back pain.  Skin: Negative for rash.  Neurological: Negative for headaches.  All other systems reviewed and are negative.    Allergies  Shellfish allergy  Home Medications   Current Outpatient Rx  Name Route Sig Dispense Refill  . ALBUTEROL SULFATE HFA 108 (90 BASE) MCG/ACT IN AERS Inhalation Inhale 2 puffs into  the lungs 4 (four) times daily.     Marland Kitchen PREDNISONE 50 MG PO TABS Oral Take 100 mg by mouth daily.    . OXYCODONE-ACETAMINOPHEN 5-325 MG PO TABS Oral Take 2 tablets by mouth every 4 (four) hours as needed. Pain      BP 126/82  Pulse 101  Temp(Src) 98.1 F (36.7 C) (Oral)  Resp 24  SpO2 99%  Physical Exam  Constitutional: He is oriented to person, place, and time. He appears well-developed and well-nourished.  HENT:  Head: Normocephalic and atraumatic.  Eyes: Conjunctivae and EOM are normal. Pupils are equal, round, and reactive to light.  Neck: Trachea normal. Neck supple. No thyromegaly present.  Cardiovascular: Normal rate, regular rhythm, S1 normal, S2 normal and normal pulses.     No systolic murmur is present   No diastolic murmur is present  Pulses:      Radial pulses are 2+ on the right side, and 2+ on the left side.  Pulmonary/Chest: Effort normal. He has wheezes. He has no rhonchi. He has no rales. He exhibits no tenderness.       Decreased air movement bilaterally with expiratory wheezes. No respiratory distress.  Abdominal: Soft. Normal appearance and bowel sounds are normal. There is no tenderness. There is no rebound, no guarding, no CVA tenderness and negative Murphy's sign.  Musculoskeletal:       BLE:s Calves nontender, no cords or erythema, negative Homans sign  Neurological: He is alert and oriented to person, place, and time. He has normal strength. No cranial nerve deficit or sensory  deficit. GCS eye subscore is 4. GCS verbal subscore is 5. GCS motor subscore is 6.  Skin: Skin is warm and dry. No rash noted. He is not diaphoretic.  Psychiatric: His speech is normal.       Cooperative and appropriate    ED Course  Procedures (including critical care time)  Prednisone and albuterol treatment. Recheck at 6:42 AM improving subjectively and on exam moving better air still is a mild expiratory wheezes. Repeat breathing treatment ordered.   MDM   Wheezes with  history of asthma. No fever productive cough to suggest infectious process. Improving with albuterol and steroids. Albuterol inhaler provided with prescription for five-day course of prednisone. Patient understands recommendation for close primary care followup in the clinic and he agrees to strict her precautions for worsening condition.        Sunnie Nielsen, MD 04/07/11 [phone]

## 2011-04-07 NOTE — ED Notes (Signed)
Post albuterol trt---faint expiratory wheezes all over but greatly improved.

## 2011-04-23 ENCOUNTER — Encounter: Payer: Self-pay | Admitting: Adult Health

## 2011-05-02 ENCOUNTER — Emergency Department (HOSPITAL_COMMUNITY): Payer: Self-pay

## 2011-05-02 ENCOUNTER — Emergency Department (HOSPITAL_COMMUNITY)
Admission: EM | Admit: 2011-05-02 | Discharge: 2011-05-02 | Disposition: A | Discharge status: Home / Self Care | Payer: Self-pay | Attending: Emergency Medicine | Admitting: Emergency Medicine

## 2011-05-02 ENCOUNTER — Encounter (HOSPITAL_COMMUNITY): Payer: Self-pay

## 2011-05-02 DIAGNOSIS — J45901 Unspecified asthma with (acute) exacerbation: Secondary | ICD-10-CM | POA: Insufficient documentation

## 2011-05-02 DIAGNOSIS — R0602 Shortness of breath: Secondary | ICD-10-CM | POA: Insufficient documentation

## 2011-05-02 DIAGNOSIS — R05 Cough: Secondary | ICD-10-CM | POA: Insufficient documentation

## 2011-05-02 DIAGNOSIS — R059 Cough, unspecified: Secondary | ICD-10-CM | POA: Insufficient documentation

## 2011-05-02 MED ORDER — IPRATROPIUM BROMIDE 0.02 % IN SOLN
0.5000 mg | Freq: Once | RESPIRATORY_TRACT | Status: AC
  Administered 2011-05-02: 0.5 mg via RESPIRATORY_TRACT
  Filled 2011-05-02: qty 2.5

## 2011-05-02 MED ORDER — ALBUTEROL SULFATE HFA 108 (90 BASE) MCG/ACT IN AERS: 1.0000 | INHALATION_SPRAY | Freq: Four times a day (QID) | RESPIRATORY_TRACT | Status: DC | PRN

## 2011-05-02 MED ORDER — ALBUTEROL SULFATE HFA 108 (90 BASE) MCG/ACT IN AERS
1.0000 | INHALATION_SPRAY | RESPIRATORY_TRACT | Status: DC
  Filled 2011-05-02: qty 6.7

## 2011-05-02 MED ORDER — PREDNISONE 50 MG PO TABS
50.0000 mg | ORAL_TABLET | Freq: Every day | ORAL | Status: DC
Start: 2011-05-02 — End: 2011-05-24

## 2011-05-02 MED ORDER — PREDNISONE 20 MG PO TABS
60.0000 mg | ORAL_TABLET | Freq: Once | ORAL | Status: AC
  Administered 2011-05-02: 60 mg via ORAL
  Filled 2011-05-02: qty 3

## 2011-05-02 MED ORDER — AEROCHAMBER PLUS W/MASK MISC
Status: AC
  Filled 2011-05-02: qty 1

## 2011-05-02 MED ORDER — ALBUTEROL (5 MG/ML) CONTINUOUS INHALATION SOLN
10.0000 mg/h | INHALATION_SOLUTION | Freq: Once | RESPIRATORY_TRACT | Status: AC
  Administered 2011-05-02: 10 mg/h via RESPIRATORY_TRACT

## 2011-05-02 MED ORDER — ALBUTEROL SULFATE (5 MG/ML) 0.5% IN NEBU
INHALATION_SOLUTION | RESPIRATORY_TRACT | Status: AC
  Filled 2011-05-02: qty 2

## 2011-05-02 NOTE — ED Notes (Signed)
Pt here for asthma attack while walking to the bus stop this am and left his inhaler at home. Here by ems given neb tx enroute by ems.

## 2011-05-02 NOTE — ED Provider Notes (Signed)
History     CSN: [id number]  Arrival date & time 05/02/11  1034   First MD Initiated Contact with Patient 05/02/11 1048      Chief Complaint  Patient presents with  . Shortness of Breath    (Consider location/radiation/quality/duration/timing/severity/associated sxs/prior treatment) HPI  H/o HIV with unknown last CD4 count, asthma presents with shortness of breath. The patient states that he began to feel acutely short of breath while walking to the bus stop today. He heard himself wheezing. He states that he left his albuterol inhaler at home at that time. He has unknown triggers for his asthma exacerbations. He states that he takes his albuterol normally 3-4 times per day. He denies recent fevers, chills. He has had slightly worsening cough recently. There's been no shortness of breath. No sick contacts. He denies chest pain. He states he's feeling better after nebulizer treatment given by EMS. Has upcoming appt for f/u but not currently taking HAART therapy.  ED Notes, ED Provider Notes from 05/02/11 0000 to 05/02/11 10:38:15       Joice Lofts, RN 05/02/2011 10:37      Pt here for asthma attack while walking to the bus stop this am and left his inhaler at home. Here by ems given neb tx enroute by ems.    Past Medical History  Diagnosis Date  . Asthma   . HIV (human immunodeficiency virus infection)     History reviewed. No pertinent past surgical history.  History reviewed. No pertinent family history.  History  Substance Use Topics  . Smoking status: Current Everyday Smoker -- 0.5 packs/day    Types: Cigarettes  . Smokeless tobacco: Never Used  . Alcohol Use: 3.0 - 3.5 oz/week    6-7 drink(s) per week    Review of Systems  All other systems reviewed and are negative.  except as noted HPI   Allergies  Shellfish allergy  Home Medications   Current Outpatient Rx  Name Route Sig Dispense Refill  . ALBUTEROL SULFATE HFA 108 (90 BASE) MCG/ACT IN AERS Inhalation  Inhale 2 puffs into the lungs every 6 (six) hours as needed. For shortness of breath    . ALBUTEROL SULFATE HFA 108 (90 BASE) MCG/ACT IN AERS Inhalation Inhale 1-2 puffs into the lungs every 6 (six) hours as needed for wheezing. 1 Inhaler 10  . PREDNISONE 50 MG PO TABS Oral Take 1 tablet (50 mg total) by mouth daily. 4 tablet 0    BP 126/79  Pulse 73  Temp(Src) 98.4 F (36.9 C) (Oral)  Resp 18  SpO2 99%  Physical Exam  Nursing note and vitals reviewed. Constitutional: He is oriented to person, place, and time. He appears well-developed and well-nourished. No distress.  HENT:  Head: Atraumatic.  Mouth/Throat: Oropharynx is clear and moist.  Eyes: Conjunctivae are normal. Pupils are equal, round, and reactive to light.  Neck: Neck supple.  Cardiovascular: Normal rate, regular rhythm, normal heart sounds and intact distal pulses.  Exam reveals no gallop and no friction rub.   No murmur heard. Pulmonary/Chest: Effort normal. No respiratory distress. He has wheezes. He has no rales.       Diffuse exp wheeze  Abdominal: Soft. Bowel sounds are normal. There is no tenderness. There is no rebound and no guarding.  Musculoskeletal: Normal range of motion. He exhibits no edema and no tenderness.  Neurological: He is alert and oriented to person, place, and time.  Skin: Skin is warm and dry.  Rt neck with small area redness, raised. Nontender. No fluctuance  Psychiatric: He has a normal mood and affect.    ED Course  Procedures (including critical care time)  Labs Reviewed - No data to display Dg Chest 2 View  05/02/2011  *RADIOLOGY REPORT*  Clinical Data: Shortness of breath in a smoker.  CHEST - 2 VIEW  Comparison: 03/28/2011  Findings: Midline trachea.  Normal heart size and mediastinal contours. No pleural effusion or pneumothorax.  Diffuse peribronchial thickening.  IMPRESSION: 1.  No acute cardiopulmonary disease. 2.  Mild peribronchial thickening which may relate to chronic  bronchitis or smoking.  Original Report Authenticated By: Consuello Bossier, M.D.    1. Asthma exacerbation    MDM  H/o Asthma, HIV pw shortness of breath, wheezing, likely 2/2 asthma exacerbation. Feeling better after one neb but with min persistent wheezing and shortness of breath. Prednisone ordered. Cont albuterol neb. CXR negative for pneumonia.   1:17 PM the patient states that he is feeling back to his baseline. He has mild right lower lung field expiratory wheeze. No tachypnea. 55% of predicted Peak flow per RT. States that he feels good and wants to go home.. Will ambulate and reassess for likely discharge home with prednisone, albuterol inh to go and refills for home. He is to f/u with pmd in regards to his asthma and has f/u for his HIV.  Ambulated without difficulty. No desaturation. Stable for discharge home.      Forbes Cellar, MD 05/02/11 1339

## 2011-05-02 NOTE — ED Notes (Signed)
Pt on stretcher, nad ntoed, abc intact, will monitor pt.

## 2011-05-02 NOTE — ED Notes (Signed)
Pt became sob while walking to bus stop this morning. sts he left his inhaler at home. Relates symptoms to the weather outside but usulaly able to help with inhaler. sts he does feel better after neb tx given by ems

## 2011-05-11 ENCOUNTER — Ambulatory Visit (INDEPENDENT_AMBULATORY_CARE_PROVIDER_SITE_OTHER): Payer: Self-pay | Admitting: *Deleted

## 2011-05-11 VITALS — BP 99/65 | HR 84 | Temp 98.3°F | Resp 18 | Wt 184.5 lb

## 2011-05-11 DIAGNOSIS — B20 Human immunodeficiency virus [HIV] disease: Secondary | ICD-10-CM

## 2011-05-11 LAB — BASIC METABOLIC PANEL
CO2: 24 mEq/L (ref 19–32)
Calcium: 8.9 mg/dL (ref 8.4–10.5)
Creat: 0.88 mg/dL (ref 0.50–1.35)
Glucose, Bld: 86 mg/dL (ref 70–99)

## 2011-05-11 NOTE — Progress Notes (Signed)
Patient here for 8 month START study appointment. He reports that over the past 2 weeks his stomach has been bothering him: lower abd pain isolated around navel, decreased appetite, loose stools, has noticed dark blood in them. Reports nausea and vomits almost every morning, no blood in emesis. He admits to drinking alcohol constantly, whatever someone gives him and appears drunk now, which he admits to. We discussed the medical consequences of his alcohol abuse: GI Bleed, esophageal varices, cirrhosis,etc and offered him counseling with Serina Cowper, which he refuses.  I instructed him to make an appt to see one of our physicians within the next week,, since Nida Boatman is gone. His Juanell Fairly has elapsed and he also needs to sign up for the county card. After reviewing his medical record, noted that he has had multiple admissions to ED in past few months for SOB, asthma exacerbation, alcohol intoxication. I will notify Baird Lyons who is his case manager to make sure he keeps his appointments.

## 2011-05-13 LAB — HIV-1 RNA QUANT-NO REFLEX-BLD: HIV-1 RNA Quant, Log: 2.65 {Log} — ABNORMAL HIGH (ref <1.30)

## 2011-05-24 ENCOUNTER — Emergency Department (HOSPITAL_COMMUNITY): Payer: Self-pay

## 2011-05-24 ENCOUNTER — Encounter (HOSPITAL_COMMUNITY): Payer: Self-pay | Admitting: *Deleted

## 2011-05-24 ENCOUNTER — Emergency Department (HOSPITAL_COMMUNITY)
Admission: EM | Admit: 2011-05-24 | Discharge: 2011-05-25 | Disposition: A | Discharge status: Home / Self Care | Payer: Self-pay | Attending: Emergency Medicine | Admitting: Emergency Medicine

## 2011-05-24 DIAGNOSIS — Z21 Asymptomatic human immunodeficiency virus [HIV] infection status: Secondary | ICD-10-CM | POA: Insufficient documentation

## 2011-05-24 DIAGNOSIS — R0602 Shortness of breath: Secondary | ICD-10-CM | POA: Insufficient documentation

## 2011-05-24 DIAGNOSIS — F17200 Nicotine dependence, unspecified, uncomplicated: Secondary | ICD-10-CM | POA: Insufficient documentation

## 2011-05-24 DIAGNOSIS — J45901 Unspecified asthma with (acute) exacerbation: Secondary | ICD-10-CM | POA: Insufficient documentation

## 2011-05-24 LAB — DIFFERENTIAL
Basophils Absolute: 0 10*3/uL (ref 0.0–0.1)
Basophils Relative: 1 % (ref 0–1)
Lymphocytes Relative: 59 % — ABNORMAL HIGH (ref 12–46)
Neutro Abs: 1.9 10*3/uL (ref 1.7–7.7)
Neutrophils Relative %: 30 % — ABNORMAL LOW (ref 43–77)

## 2011-05-24 LAB — CBC
Hemoglobin: 14.4 g/dL (ref 13.0–17.0)
MCHC: 33.9 g/dL (ref 30.0–36.0)
RDW: 14.4 % (ref 11.5–15.5)
WBC: 6.3 10*3/uL (ref 4.0–10.5)

## 2011-05-24 MED ORDER — METHYLPREDNISOLONE SODIUM SUCC 125 MG IJ SOLR
INTRAMUSCULAR | Status: AC
  Administered 2011-05-24: 125 mg via INTRAVENOUS
  Filled 2011-05-24: qty 2

## 2011-05-24 MED ORDER — PREDNISONE 10 MG PO TABS: 20.0000 mg | ORAL_TABLET | Freq: Every day | ORAL | Status: DC

## 2011-05-24 MED ORDER — ALBUTEROL SULFATE HFA 108 (90 BASE) MCG/ACT IN AERS
2.0000 | INHALATION_SPRAY | RESPIRATORY_TRACT | Status: DC
  Administered 2011-05-25: 2 via RESPIRATORY_TRACT
  Filled 2011-05-24: qty 6.7

## 2011-05-24 MED ORDER — ALBUTEROL SULFATE (5 MG/ML) 0.5% IN NEBU
INHALATION_SOLUTION | RESPIRATORY_TRACT | Status: AC
  Administered 2011-05-24: 5 mg
  Filled 2011-05-24: qty 1

## 2011-05-24 MED ORDER — ALBUTEROL SULFATE (5 MG/ML) 0.5% IN NEBU
5.0000 mg | INHALATION_SOLUTION | Freq: Once | RESPIRATORY_TRACT | Status: AC
  Administered 2011-05-24: 2.5 mg via RESPIRATORY_TRACT
  Filled 2011-05-24: qty 0.5

## 2011-05-24 MED ORDER — PREDNISONE 20 MG PO TABS
60.0000 mg | ORAL_TABLET | Freq: Once | ORAL | Status: AC
Start: 2011-05-24 — End: 2011-05-24
  Administered 2011-05-24: 60 mg via ORAL
  Filled 2011-05-24: qty 3

## 2011-05-24 MED ORDER — IPRATROPIUM BROMIDE 0.02 % IN SOLN
0.5000 mg | Freq: Once | RESPIRATORY_TRACT | Status: AC
  Administered 2011-05-24: 0.5 mg via RESPIRATORY_TRACT
  Filled 2011-05-24: qty 2.5

## 2011-05-24 NOTE — Discharge Instructions (Signed)
Asthma Attack Prevention HOW CAN ASTHMA BE PREVENTED? Currently, there is no way to prevent asthma from starting. However, you can take steps to control the disease and prevent its symptoms after you have been diagnosed. Learn about your asthma and how to control it. Take an active role to control your asthma by working with your caregiver to create and follow an asthma action plan. An asthma action plan guides you in taking your medicines properly, avoiding factors that make your asthma worse, tracking your level of asthma control, responding to worsening asthma, and seeking emergency care when needed. To track your asthma, keep records of your symptoms, check your peak flow number using a peak flow meter (handheld device that shows how well air moves out of your lungs), and get regular asthma checkups.  Other ways to prevent asthma attacks include:  Use medicines as your caregiver directs.   Identify and avoid things that make your asthma worse (as much as you can).   Keep track of your asthma symptoms and level of control.   Get regular checkups for your asthma.   With your caregiver, write a detailed plan for taking medicines and managing an asthma attack. Then be sure to follow your action plan. Asthma is an ongoing condition that needs regular monitoring and treatment.   Identify and avoid asthma triggers. A number of outdoor allergens and irritants (pollen, mold, cold air, air pollution) can trigger asthma attacks. Find out what causes or makes your asthma worse, and take steps to avoid those triggers (see below).   Monitor your breathing. Learn to recognize warning signs of an attack, such as slight coughing, wheezing or shortness of breath. However, your lung function may already decrease before you notice any signs or symptoms, so regularly measure and record your peak airflow with a home peak flow meter.   Identify and treat attacks early. If you act quickly, you're less likely to have  a severe attack. You will also need less medicine to control your symptoms. When your peak flow measurements decrease and alert you to an upcoming attack, take your medicine as instructed, and immediately stop any activity that may have triggered the attack. If your symptoms do not improve, get medical help.   Pay attention to increasing quick-relief inhaler use. If you find yourself relying on your quick-relief inhaler (such as albuterol), your asthma is not under control. See your caregiver about adjusting your treatment.  IDENTIFY AND CONTROL FACTORS THAT MAKE YOUR ASTHMA WORSE A number of common things can set off or make your asthma symptoms worse (asthma triggers). Keep track of your asthma symptoms for several weeks, detailing all the environmental and emotional factors that are linked with your asthma. When you have an asthma attack, go back to your asthma diary to see which factor, or combination of factors, might have contributed to it. Once you know what these factors are, you can take steps to control many of them.  Allergies: If you have allergies and asthma, it is important to take asthma prevention steps at home. Asthma attacks (worsening of asthma symptoms) can be triggered by allergies, which can cause temporary increased inflammation of your airways. Minimizing contact with the substance to which you are allergic will help prevent an asthma attack. Animal Dander:   Some people are allergic to the flakes of skin or dried saliva from animals with fur or feathers. Keep these pets out of your home.   If you can't keep a pet outdoors, keep the   pet out of your bedroom and other sleeping areas at all times, and keep the door closed.   Remove carpets and furniture covered with cloth from your home. If that is not possible, keep the pet away from fabric-covered furniture and carpets.  Dust Mites:  Many people with asthma are allergic to dust mites. Dust mites are tiny bugs that are found in  every home, in mattresses, pillows, carpets, fabric-covered furniture, bedcovers, clothes, stuffed toys, fabric, and other fabric-covered items.   Cover your mattress in a special dust-proof cover.   Cover your pillow in a special dust-proof cover, or wash the pillow each week in hot water. Water must be hotter than 130 F to kill dust mites. Cold or warm water used with detergent and bleach can also be effective.   Wash the sheets and blankets on your bed each week in hot water.   Try not to sleep or lie on cloth-covered cushions.   Call ahead when traveling and ask for a smoke-free hotel room. Bring your own bedding and pillows, in case the hotel only supplies feather pillows and down comforters, which may contain dust mites and cause asthma symptoms.   Remove carpets from your bedroom and those laid on concrete, if you can.   Keep stuffed toys out of the bed, or wash the toys weekly in hot water or cooler water with detergent and bleach.  Cockroaches:  Many people with asthma are allergic to the droppings and remains of cockroaches.   Keep food and garbage in closed containers. Never leave food out.   Use poison baits, traps, powders, gels, or paste (for example, boric acid).   If a spray is used to kill cockroaches, stay out of the room until the odor goes away.  Indoor Mold:  Fix leaky faucets, pipes, or other sources of water that have mold around them.   Clean moldy surfaces with a cleaner that has bleach in it.  Pollen and Outdoor Mold:  When pollen or mold spore counts are high, try to keep your windows closed.   Stay indoors with windows closed from late morning to afternoon, if you can. Pollen and some mold spore counts are highest at that time.   Ask your caregiver whether you need to take or increase anti-inflammatory medicine before your allergy season starts.  Irritants:   Tobacco smoke is an irritant. If you smoke, ask your caregiver how you can quit. Ask family  members to quit smoking, too. Do not allow smoking in your home or car.   If possible, do not use a wood-burning stove, kerosene heater, or fireplace. Minimize exposure to all sources of smoke, including incense, candles, fires, and fireworks.   Try to stay away from strong odors and sprays, such as perfume, talcum powder, hair spray, and paints.   Decrease humidity in your home and use an indoor air cleaning device. Reduce indoor humidity to below 60 percent. Dehumidifiers or central air conditioners can do this.   Try to have someone else vacuum for you once or twice a week, if you can. Stay out of rooms while they are being vacuumed and for a short while afterward.   If you vacuum, use a dust mask from a hardware store, a double-layered or microfilter vacuum cleaner bag, or a vacuum cleaner with a HEPA filter.   Sulfites in foods and beverages can be irritants. Do not drink beer or wine, or eat dried fruit, processed potatoes, or shrimp if they cause asthma   symptoms.   Cold air can trigger an asthma attack. Cover your nose and mouth with a scarf on cold or windy days.   Several health conditions can make asthma more difficult to manage, including runny nose, sinus infections, reflux disease, psychological stress, and sleep apnea. Your caregiver will treat these conditions, as well.   Avoid close contact with people who have a cold or the flu, since your asthma symptoms may get worse if you catch the infection from them. Wash your hands thoroughly after touching items that may have been handled by people with a respiratory infection.   Get a flu shot every year to protect against the flu virus, which often makes asthma worse for days or weeks. Also get a pneumonia shot once every five to 10 years.  Drugs:  Aspirin and other painkillers can cause asthma attacks. 10% to 20% of people with asthma have sensitivity to aspirin or a group of painkillers called non-steroidal anti-inflammatory drugs  (NSAIDS), such as ibuprofen and naproxen. These drugs are used to treat pain and reduce fevers. Asthma attacks caused by any of these medicines can be severe and even fatal. These drugs must be avoided in people who have known aspirin sensitive asthma. Products with acetaminophen are considered safe for people who have asthma. It is important that people with aspirin sensitivity read labels of all over-the-counter drugs used to treat pain, colds, coughs, and fever.   Beta blockers and ACE inhibitors are other drugs which you should discuss with your caregiver, in relation to your asthma.  ALLERGY SKIN TESTING  Ask your asthma caregiver about allergy skin testing or blood testing (RAST test) to identify the allergens to which you are sensitive. If you are found to have allergies, allergy shots (immunotherapy) for asthma may help prevent future allergies and asthma. With allergy shots, small doses of allergens (substances to which you are allergic) are injected under your skin on a regular schedule. Over a period of time, your body may become used to the allergen and less responsive with asthma symptoms. You can also take measures to minimize your exposure to those allergens. EXERCISE  If you have exercise-induced asthma, or are planning vigorous exercise, or exercise in cold, humid, or dry environments, prevent exercise-induced asthma by following your caregiver's advice regarding asthma treatment before exercising. Document Released: 02/17/2009 Document Revised: 02/18/2011 Document Reviewed: 02/17/2009 [employer] Patient Information 2012 [employer]. 

## 2011-05-24 NOTE — ED Provider Notes (Signed)
History     CSN: [id number]  Arrival date & time 05/24/11  2112   First MD Initiated Contact with Patient 05/24/11 2141      Chief Complaint  Patient presents with  . Shortness of Breath    (Consider location/radiation/quality/duration/timing/severity/associated sxs/prior treatment) HPI Patient complaining of asthma with wheezing and sob.  Patient coughing productive of yellow phlegm.  Patient out of inhaler.  Patient states there is dust at work.  Patient is smoker states he hasn't smoke since yesterday.  PMD Dr. Anne Shutter at h[facility] clinic.  Patient states he doesn't need to be on hiv meds yet per his md.  Patient drank a pint and a fifth of vodka with friends tonight.    Past Medical History  Diagnosis Date  . Asthma   . HIV (human immunodeficiency virus infection)     History reviewed. No pertinent past surgical history.  History reviewed. No pertinent family history.  History  Substance Use Topics  . Smoking status: Current Everyday Smoker -- 0.5 packs/day    Types: Cigarettes  . Smokeless tobacco: Never Used  . Alcohol Use: 3.0 - 3.5 oz/week    6-7 drink(s) per week      Review of Systems  All other systems reviewed and are negative.    Allergies  Shellfish allergy  Home Medications   Current Outpatient Rx  Name Route Sig Dispense Refill  . ALBUTEROL SULFATE HFA 108 (90 BASE) MCG/ACT IN AERS Inhalation Inhale 1-2 puffs into the lungs every 6 (six) hours as needed for wheezing. 1 Inhaler 10  . ALBUTEROL SULFATE HFA 108 (90 BASE) MCG/ACT IN AERS Inhalation Inhale 2 puffs into the lungs every 6 (six) hours as needed. For shortness of breath      BP 113/69  Pulse 110  Temp(Src) 97.4 F (36.3 C) (Oral)  Resp 22  SpO2 96%  Physical Exam  Nursing note and vitals reviewed. Constitutional: He is oriented to person, place, and time. He appears well-developed and well-nourished.  HENT:  Head: Normocephalic and atraumatic.  Right Ear: External ear normal.  Left  Ear: External ear normal.  Eyes: Conjunctivae are normal. Pupils are equal, round, and reactive to light.  Neck: Normal range of motion. Neck supple.  Cardiovascular: Normal rate and regular rhythm.   Pulmonary/Chest: He has wheezes.  Musculoskeletal: Normal range of motion.  Neurological: He is alert and oriented to person, place, and time.  Skin: Skin is warm and dry.  Psychiatric: He has a normal mood and affect.    ED Course  Procedures (including critical care time)  Labs Reviewed - No data to display No results found.   No diagnosis found.    MDM  Patient with resolution of wheezing after albuterol inhaler. Rest. His normal and oxygen saturation is 96% on room air. CBC is normal and chest x-Krishawn Vanderweele is clear. Patient is given albuterol inhaler here and prescription for prednisone. He is advised to followup with his primary care Dr.      Hilario Quarry, MD 05/24/11 [phone]

## 2011-05-24 NOTE — ED Notes (Signed)
Received pt. From home via EMS with c/o SOB, ETOH noted, pt. Alert and oriented, lung sounds slight wheezing noted, Albuterol neb given by EMS, NAD noted

## 2011-05-25 NOTE — ED Notes (Signed)
Pt. Discharged to home, pt. Alert and oriented, NAD noted, respirations even and regular, pt. Ambulatory gait steady

## 2011-05-27 ENCOUNTER — Encounter: Payer: Self-pay | Admitting: Infectious Disease

## 2011-05-27 LAB — CD4/CD8 (T-HELPER/T-SUPPRESSOR CELL)
CD4%: 37.1
CD8: 1155

## 2011-06-19 ENCOUNTER — Emergency Department (HOSPITAL_COMMUNITY)
Admission: EM | Admit: 2011-06-19 | Discharge: 2011-06-20 | Disposition: A | Discharge status: Home / Self Care | Payer: Self-pay | Attending: Emergency Medicine | Admitting: Emergency Medicine

## 2011-06-19 ENCOUNTER — Encounter (HOSPITAL_COMMUNITY): Payer: Self-pay | Admitting: Emergency Medicine

## 2011-06-19 DIAGNOSIS — Z21 Asymptomatic human immunodeficiency virus [HIV] infection status: Secondary | ICD-10-CM | POA: Insufficient documentation

## 2011-06-19 DIAGNOSIS — F10929 Alcohol use, unspecified with intoxication, unspecified: Secondary | ICD-10-CM

## 2011-06-19 DIAGNOSIS — J45909 Unspecified asthma, uncomplicated: Secondary | ICD-10-CM | POA: Insufficient documentation

## 2011-06-19 DIAGNOSIS — R000 Tachycardia, unspecified: Secondary | ICD-10-CM | POA: Insufficient documentation

## 2011-06-19 DIAGNOSIS — F1010 Alcohol abuse, uncomplicated: Secondary | ICD-10-CM | POA: Insufficient documentation

## 2011-06-19 MED ORDER — ALBUTEROL SULFATE (5 MG/ML) 0.5% IN NEBU
INHALATION_SOLUTION | RESPIRATORY_TRACT | Status: AC
  Administered 2011-06-19: 23:00:00
  Filled 2011-06-19: qty 1

## 2011-06-19 MED ORDER — ALBUTEROL SULFATE (5 MG/ML) 0.5% IN NEBU
5.0000 mg | INHALATION_SOLUTION | Freq: Once | RESPIRATORY_TRACT | Status: AC
  Administered 2011-06-19: 5 mg via RESPIRATORY_TRACT
  Filled 2011-06-19 (×2): qty 0.5

## 2011-06-19 MED ORDER — ALBUTEROL SULFATE (5 MG/ML) 0.5% IN NEBU
INHALATION_SOLUTION | RESPIRATORY_TRACT | Status: AC
  Filled 2011-06-19: qty 1

## 2011-06-19 NOTE — ED Notes (Addendum)
Pt had a pint to drink and started feeling SOB and called EMS. Albuterol neb treatment by EMS.

## 2011-06-19 NOTE — ED Notes (Signed)
Pt refused to take his pants off, he stated that he did not have boxers on and he has never had to take his pants off before. Pt keeps asking for food and has been told twice he cant have anything to eat until he speaks to the dr.

## 2011-06-20 MED ORDER — PREDNISONE 10 MG PO TABS: 40.0000 mg | ORAL_TABLET | Freq: Every day | ORAL | Status: DC

## 2011-06-20 NOTE — ED Provider Notes (Signed)
Medical screening examination/treatment/procedure(s) were conducted as a shared visit with non-physician practitioner(s) and myself.  I personally evaluated the patient during the encounter This clinically intoxicated M, well described by PA Artis Flock, p/w wheezing, dyspnea.  Following nebs, labs, steroids, and sleeping the patient's Sx were notably improved, and he was ambulatory, speaking clearly.  He was discharged when he was numerically / clinically sober.  Gerhard Munch, MD 06/20/11 2246

## 2011-06-20 NOTE — ED Provider Notes (Signed)
History     CSN: [id number]  Arrival date & time 06/19/11  2238   First MD Initiated Contact with Patient 06/19/11 2259      Chief Complaint  Patient presents with  . Asthma    (Consider location/radiation/quality/duration/timing/severity/associated sxs/prior treatment) Patient is a 41 y.o. male presenting with asthma. The history is provided by the patient. The history is limited by the condition of the patient (Admitted alcohol intoxication).  Asthma This is a recurrent problem. The current episode started today. The problem occurs constantly. Progression since onset: Gradually improved since treatment by EMS. Associated symptoms comments: Positive chest tightness that is consistent with prior episodes of asthma attacks. Negative for fever, chills, cough, congestion, chest pain, lower extremity swelling.. The symptoms are aggravated by nothing. Treatments tried: Albuterol and Atrovent nebulized treatment by EMS. The treatment provided mild relief.   the patient was out drinking with friends this evening in anticipation of his birthday tomorrow and began to feel more short of breath.  Past Medical History  Diagnosis Date  . Asthma   . HIV (human immunodeficiency virus infection)     History reviewed. No pertinent past surgical history.  No family history on file.  History  Substance Use Topics  . Smoking status: Current Everyday Smoker -- 0.5 packs/day    Types: Cigarettes  . Smokeless tobacco: Never Used  . Alcohol Use: 3.0 - 3.5 oz/week    6-7 drink(s) per week      Review of Systems 10 systems reviewed and are negative for acute change except as noted in the HPI.  Allergies  Shellfish allergy  Home Medications   Current Outpatient Rx  Name Route Sig Dispense Refill  . ALBUTEROL SULFATE HFA 108 (90 BASE) MCG/ACT IN AERS Inhalation Inhale 2 puffs into the lungs every 6 (six) hours as needed. For shortness of breath    . ALBUTEROL SULFATE HFA 108 (90 BASE) MCG/ACT  IN AERS Inhalation Inhale 1-2 puffs into the lungs every 6 (six) hours as needed for wheezing. 1 Inhaler 10  . PREDNISONE 10 MG PO TABS Oral Take 2 tablets (20 mg total) by mouth daily. 15 tablet 0    BP 102/68  Pulse 107  Temp(Src) 97.7 F (36.5 C) (Oral)  Resp 20  SpO2 95%  Physical Exam  Nursing note reviewed. Constitutional: He appears well-developed and well-nourished. No distress.       Signs are reviewed and are significant for slight tachycardia. Oxygen saturation is 95%, which is normal.  HENT:  Head: Normocephalic and atraumatic.  Eyes: Pupils are equal, round, and reactive to light.  Neck: Normal range of motion. Neck supple.  Cardiovascular: Regular rhythm, normal heart sounds and intact distal pulses.   No murmur heard.      Tachycardia a rate of 104 on auscultation.  Pulmonary/Chest: He exhibits no tenderness.       Speaks in 5 word sentences. Increased work of breathing. Wheezes are heard in all lung fields and there is decreased air movement throughout.  Abdominal: Soft. Bowel sounds are normal. He exhibits no distension. There is no tenderness.  Neurological: He has normal strength. No cranial nerve deficit.       Patient is awake, alert, oriented to person place and events. Speech is slightly slurred.  Skin: Skin is warm and dry.       No visible wounds    ED Course  Procedures (including critical care time)  Labs Reviewed  ETHANOL - Abnormal; Notable for the following:  Alcohol, Ethyl (B) 326 (*)    All other components within normal limits   No results found.   Dx 1: Acute asthma exacerbation Dx 2: Alcohol intoxication    MDM  Patient with acute asthma exacerbation. Denies any prior hospitalizations for his asthma. Has been seen in the emergency department several times and is typically sent home with a prescription for steroids. A second breathing treatment was administered in the emergency department and patient port subjective improvement in  breathing. Wheezing is decreased significantly and he is moving much more air. His oxygen saturation is 97% on room air on reassessment.  The patient has a significant degree of intoxication. His pupils are equal and reactive. He gives a good account of the events leading up to his presentation to the emergency department. His head is atraumatic. I doubt any head injury contributing to his altered mental status and suspect that it is all secondary to call with a level of 326. He does not have anyone who can come and take him home. Therefore, he will need to stay in the emergency department until he is able to ambulate with a steady gait and is alert and oriented x3.  ED P. Jeraldine Loots has been given report on this patient and we'll continue to monitor overnight with a plan to discharge in the early morning.        Shaaron Adler, PA-C 06/20/11 0130

## 2011-06-20 NOTE — ED Notes (Signed)
Stated understanding to dc instruction. Denies any pain

## 2011-06-20 NOTE — Discharge Instructions (Signed)
Asthma, Adult Asthma is caused by narrowing of the air passages in the lungs. It may be triggered by pollen, dust, animal dander, molds, some foods, respiratory infections, exposure to smoke, exercise, emotional stress or other allergens (things that cause allergic reactions or allergies). Repeat attacks are common. HOME CARE INSTRUCTIONS   Use prescription medications as ordered by your caregiver.   Avoid pollen, dust, animal dander, molds, smoke and other things that cause attacks at home and at work.   You may have fewer attacks if you decrease dust in your home. Electrostatic air cleaners may help.   It may help to replace your pillows or mattress with materials less likely to cause allergies.   Talk to your caregiver about an action plan for managing asthma attacks at home, including, the use of a peak flow meter which measures the severity of your asthma attack. An action plan can help minimize or stop the attack without having to seek medical care.   If you are not on a fluid restriction, drink 8 to 10 glasses of water each day.   Always have a plan prepared for seeking medical attention, including, calling your physician, accessing local emergency care, and calling 911 (in the [place] for a severe attack.   Discuss possible exercise routines with your caregiver.   If animal dander is the cause of asthma, you may need to get rid of pets.  SEEK MEDICAL CARE IF:   You have wheezing and shortness of breath even if taking medicine to prevent attacks.   You have muscle aches, chest pain or thickening of sputum.   Your sputum changes from clear or white to yellow, green, gray, or bloody.   You have any problems that may be related to the medicine you are taking (such as a rash, itching, swelling or trouble breathing).  SEEK IMMEDIATE MEDICAL CARE IF:   Your usual medicines do not stop your wheezing or there is increased coughing and/or shortness of breath.   You have increased  difficulty breathing.   You have a fever.  MAKE SURE YOU:   Understand these instructions.   Will watch your condition.   Will get help right away if you are not doing well or get worse.  Document Released: 03/01/2005 Document Revised: 02/18/2011 Document Reviewed: 10/18/2007 [facility] Patient Information 2012 [place], [place].          Alcohol Intoxication Alcohol intoxication means your blood alcohol level is above legal limits. Alcohol is a drug. It has serious side effects. These side effects can include:  Damage to your organs (liver, nervous system, and blood system).   Unclear thinking.   Slowed reflexes.   Decreased muscle coordination.  HOME CARE  Do not drink and drive.   Do not drink alcohol if you are taking medicine or using other drugs. Doing so can cause serious medical problems or even death.   Drink enough water and fluids to keep your pee (urine) clear or pale yellow.   Eat healthy foods.   Only take medicine as told by your doctor.   Join an alcohol support group.  GET HELP RIGHT AWAY IF:  You become shaky when you stop drinking.   Your thinking is unclear or you become confused.   You throw up (vomit) blood. It may look bright red or like coffee grounds.   You notice blood in your poop (bowel movements).   You become lightheaded or pass out (faint).  MAKE SURE YOU:   Understand these instructions.  Will watch your condition.   Will get help right away if you are not doing well or get worse.  Document Released: 08/18/2007 Document Revised: 02/18/2011 Document Reviewed: 08/18/2009 [facility] Patient Information 2012 [place], [place].

## 2011-07-02 ENCOUNTER — Emergency Department (HOSPITAL_COMMUNITY)
Admission: EM | Admit: 2011-07-02 | Discharge: 2011-07-02 | Disposition: A | Discharge status: Home / Self Care | Payer: Self-pay | Attending: Emergency Medicine | Admitting: Emergency Medicine

## 2011-07-02 ENCOUNTER — Encounter (HOSPITAL_COMMUNITY): Payer: Self-pay | Admitting: Emergency Medicine

## 2011-07-02 DIAGNOSIS — R0989 Other specified symptoms and signs involving the circulatory and respiratory systems: Secondary | ICD-10-CM | POA: Insufficient documentation

## 2011-07-02 DIAGNOSIS — J45901 Unspecified asthma with (acute) exacerbation: Secondary | ICD-10-CM | POA: Insufficient documentation

## 2011-07-02 DIAGNOSIS — R05 Cough: Secondary | ICD-10-CM | POA: Insufficient documentation

## 2011-07-02 DIAGNOSIS — Z21 Asymptomatic human immunodeficiency virus [HIV] infection status: Secondary | ICD-10-CM | POA: Insufficient documentation

## 2011-07-02 DIAGNOSIS — R0609 Other forms of dyspnea: Secondary | ICD-10-CM | POA: Insufficient documentation

## 2011-07-02 DIAGNOSIS — R059 Cough, unspecified: Secondary | ICD-10-CM | POA: Insufficient documentation

## 2011-07-02 DIAGNOSIS — F17200 Nicotine dependence, unspecified, uncomplicated: Secondary | ICD-10-CM | POA: Insufficient documentation

## 2011-07-02 MED ORDER — PREDNISONE 20 MG PO TABS: 60.0000 mg | ORAL_TABLET | Freq: Every day | ORAL | Status: AC

## 2011-07-02 MED ORDER — ALBUTEROL SULFATE HFA 108 (90 BASE) MCG/ACT IN AERS
2.0000 | INHALATION_SPRAY | Freq: Four times a day (QID) | RESPIRATORY_TRACT | Status: DC
  Administered 2011-07-02: 2 via RESPIRATORY_TRACT
  Filled 2011-07-02: qty 6.7

## 2011-07-02 MED ORDER — ALBUTEROL SULFATE (5 MG/ML) 0.5% IN NEBU
2.5000 mg | INHALATION_SOLUTION | RESPIRATORY_TRACT | Status: AC
  Administered 2011-07-02: 2.5 mg via RESPIRATORY_TRACT
  Filled 2011-07-02: qty 0.5

## 2011-07-02 MED ORDER — PREDNISONE 20 MG PO TABS
60.0000 mg | ORAL_TABLET | ORAL | Status: AC
  Administered 2011-07-02: 60 mg via ORAL
  Filled 2011-07-02: qty 3

## 2011-07-02 MED ORDER — IPRATROPIUM BROMIDE 0.02 % IN SOLN
RESPIRATORY_TRACT | Status: AC
  Administered 2011-07-02: 12:00:00
  Filled 2011-07-02: qty 2.5

## 2011-07-02 MED ORDER — ALBUTEROL SULFATE (5 MG/ML) 0.5% IN NEBU
INHALATION_SOLUTION | RESPIRATORY_TRACT | Status: AC
  Administered 2011-07-02: 12:00:00
  Filled 2011-07-02: qty 1

## 2011-07-02 MED ORDER — METHYLPREDNISOLONE SODIUM SUCC 125 MG IJ SOLR
INTRAMUSCULAR | Status: AC
  Administered 2011-07-02: 12:00:00
  Filled 2011-07-02: qty 2

## 2011-07-02 NOTE — ED Notes (Signed)
EMS [place]  [place] having asthma attack/exacerbation, s/s increasing over last 24hrs. Pt given nebs per EMS albuterol and atrovent with relief to shob; PIV 20g L hand with solu-medrol 125mg . Pt arrives  A&O, non-labored breathing at this time.

## 2011-07-02 NOTE — Discharge Instructions (Signed)
Asthma, Adult Asthma is caused by narrowing of the air passages in the lungs. It may be triggered by pollen, dust, animal dander, molds, some foods, respiratory infections, exposure to smoke, exercise, emotional stress or other allergens (things that cause allergic reactions or allergies). Repeat attacks are common. HOME CARE INSTRUCTIONS   Use prescription medications as ordered by your caregiver.   Avoid pollen, dust, animal dander, molds, smoke and other things that cause attacks at home and at work.   You may have fewer attacks if you decrease dust in your home. Electrostatic air cleaners may help.   It may help to replace your pillows or mattress with materials less likely to cause allergies.   Talk to your caregiver about an action plan for managing asthma attacks at home, including, the use of a peak flow meter which measures the severity of your asthma attack. An action plan can help minimize or stop the attack without having to seek medical care.   If you are not on a fluid restriction, drink 8 to 10 glasses of water each day.   Always have a plan prepared for seeking medical attention, including, calling your physician, accessing local emergency care, and calling 911 (in the [place] for a severe attack.   Discuss possible exercise routines with your caregiver.   If animal dander is the cause of asthma, you may need to get rid of pets.  SEEK MEDICAL CARE IF:   You have wheezing and shortness of breath even if taking medicine to prevent attacks.   You have muscle aches, chest pain or thickening of sputum.   Your sputum changes from clear or white to yellow, green, gray, or bloody.   You have any problems that may be related to the medicine you are taking (such as a rash, itching, swelling or trouble breathing).  SEEK IMMEDIATE MEDICAL CARE IF:   Your usual medicines do not stop your wheezing or there is increased coughing and/or shortness of breath.   You have increased  difficulty breathing.   You have a fever.  MAKE SURE YOU:   Understand these instructions.   Will watch your condition.   Will get help right away if you are not doing well or get worse.  Document Released: 03/01/2005 Document Revised: 02/18/2011 Document Reviewed: 10/18/2007 [employer] Patient Information 2012 [employer]. 

## 2011-07-02 NOTE — ED Provider Notes (Signed)
History     CSN: [id number]  Arrival date & time 07/02/11  1034   First MD Initiated Contact with Patient 07/02/11 1037      Chief Complaint  Patient presents with  . Asthma    HPI This patient with asthma, HIV now presents with dyspnea, cough, generalized discomfort.  He notes that he has not had his asthma medications.  This episode began yesterday, gradually.  Since he's had progressive symptoms.  He has been attempting to use inhaled steroids for symptom control without improvement.  He denies any ongoing vomiting, diarrhea, lightheadedness, syncope, chest pain.  Past Medical History  Diagnosis Date  . Asthma   . HIV (human immunodeficiency virus infection)     No past surgical history on file.  No family history on file.  History  Substance Use Topics  . Smoking status: Current Everyday Smoker -- 0.5 packs/day    Types: Cigarettes  . Smokeless tobacco: Never Used  . Alcohol Use: 3.0 - 3.5 oz/week    6-7 drink(s) per week      Review of Systems  All other systems reviewed and are negative.    Allergies  Shellfish allergy  Home Medications   Current Outpatient Rx  Name Route Sig Dispense Refill  . ALBUTEROL SULFATE HFA 108 (90 BASE) MCG/ACT IN AERS Inhalation Inhale 2 puffs into the lungs every 6 (six) hours as needed. For shortness of breath    . PREDNISONE 10 MG PO TABS Oral Take 4 tablets (40 mg total) by mouth daily. 20 tablet 0    BP 125/82  Pulse 86  Temp(Src) 98.3 F (36.8 C) (Oral)  Resp 20  SpO2 94%  Physical Exam  Nursing note and vitals reviewed. Constitutional: He is oriented to person, place, and time. He appears well-developed. No distress.  HENT:  Head: Normocephalic and atraumatic.  Eyes: Conjunctivae and EOM are normal.  Cardiovascular: Normal rate and regular rhythm.   Pulmonary/Chest: Effort normal. No stridor. Not tachypneic. No respiratory distress. He has wheezes. He has no rhonchi.  Abdominal: He exhibits no distension.    Musculoskeletal: He exhibits no edema.  Neurological: He is alert and oriented to person, place, and time.  Skin: Skin is warm and dry.  Psychiatric: He has a normal mood and affect.    ED Course  Procedures (including critical care time)  Labs Reviewed - No data to display No results found.   No diagnosis found.  Pulse oximetry 99% on room air normal   MDM  This well-appearing male with HIV and asthma now presents with a one-day period of dyspnea, cough, generalized discomfort.  On exam the patient is in no distress with audible wheezing bilaterally, but no hypoxia.  The patient received albuterol nebulizer treatments, oral steroids and was discharged with an albuterol inhaler, prescription for steroids, PMD followup.  The patient's endorsement of appropriate HIV care, and denial of any other concerning symptoms is reassuring.     Gerhard Munch, MD 07/02/11 1154

## 2011-07-02 NOTE — Progress Notes (Signed)
Met with patient to discuss his health care practices. He does see the infectious disease doctor across the street. He does not have insurance and has been appealing his Medicaid denial. Patient agreed to the [facility] program and he was provided with a packet and instructed to contact the community liaison. Information left for community liaison.

## 2011-07-07 ENCOUNTER — Other Ambulatory Visit: Payer: Self-pay

## 2011-07-07 ENCOUNTER — Ambulatory Visit (INDEPENDENT_AMBULATORY_CARE_PROVIDER_SITE_OTHER): Payer: Self-pay | Admitting: *Deleted

## 2011-07-07 DIAGNOSIS — B20 Human immunodeficiency virus [HIV] disease: Secondary | ICD-10-CM

## 2011-07-07 LAB — POCT URINALYSIS DIPSTICK
Blood, UA: NEGATIVE
Ketones, UA: 15
Spec Grav, UA: 1.025
pH, UA: 6

## 2011-07-07 NOTE — Progress Notes (Signed)
Patient came by to give urine sample for START study and reeked of BO and alcohol. He is c/o stomach upset, diarrhea and no appetite. Has been going on for a long time and he feels like he is losing weight. He admits to drinking heavily and has multiple admissions to ED involving alcohol. He is also c/o gout pain. We discussed the ramifications of alcohol intoxication and considering counseling or rehab. He says his mother died in her 41s from cirrhosis. I gave him Alisa's number to call for an appt. And made him an appt to see Dr. Luciana Axe soon. He was a patient of Brad's in the past.

## 2011-07-20 ENCOUNTER — Encounter: Payer: Self-pay | Admitting: Internal Medicine

## 2011-07-20 ENCOUNTER — Ambulatory Visit (INDEPENDENT_AMBULATORY_CARE_PROVIDER_SITE_OTHER): Payer: Self-pay | Admitting: Internal Medicine

## 2011-07-20 DIAGNOSIS — R1084 Generalized abdominal pain: Secondary | ICD-10-CM | POA: Insufficient documentation

## 2011-07-20 DIAGNOSIS — F1010 Alcohol abuse, uncomplicated: Secondary | ICD-10-CM

## 2011-07-20 DIAGNOSIS — R109 Unspecified abdominal pain: Secondary | ICD-10-CM

## 2011-07-20 NOTE — Assessment & Plan Note (Signed)
He is trying to quit on his own. I did remind him of the dangers of stopping alcohol in someone who is an alcoholic. I did encourage him to call for detox. He voiced his understanding of the dangers of alcohol withdrawal.

## 2011-07-20 NOTE — Progress Notes (Signed)
  Subjective:    Patient ID: John [name], male    DOB: 03-15-1971, 41 y.o.   MRN: [id number]  HPI This patient comes in for followup of GI issues. He has been seen in the emergency room and by the study coordinator for chronic nausea and abdominal pain. He is a study patient and is currently not on medications per the study protocol. He also is a known alcoholic and does smoke daily. He was told prior to this visit by the study coordinator that many of his issues are likely related to alcohol abuse. Because of that, he actually has significantly decreased his alcohol intake. In fact today he tells me that his nausea and GI upset has resolved. He also continues to have problems with asthma but does have an inhaler. He uses it and is able to get refills. He had some weight loss but that has stabilized since his appetite has returned with his significant lowering of alcohol intake. He is trying to quit alcohol on his own, but does have number to call for detox when he desires it. He does get the shakes without alcohol.   Review of Systems  Constitutional: Positive for unexpected weight change. Negative for fever, chills and fatigue.  Respiratory: Negative for shortness of breath and wheezing.   Cardiovascular: Negative for chest pain, palpitations and leg swelling.  Gastrointestinal: Negative for nausea, vomiting, abdominal pain, diarrhea and constipation.  Psychiatric/Behavioral: Negative for sleep disturbance and dysphoric mood. The patient is not nervous/anxious.        Objective:   Physical Exam  Constitutional: He appears well-developed and well-nourished. No distress.       Spells of body odor  Pulmonary/Chest: Effort normal and breath sounds normal. No respiratory distress. He has no wheezes. He has no rales.  Abdominal: Soft. Bowel sounds are normal. He exhibits no distension. There is no tenderness. There is no rebound.  Psychiatric:       Some poor eye contact            Assessment & Plan:

## 2011-07-20 NOTE — Assessment & Plan Note (Signed)
At this time, he is having no more problems with abdominal discomfort or nausea. He attributes this to significant decrease in alcohol. I did remind him that if it returns or if he has more problems he should call us and we will see him as soon as needed.

## 2011-08-04 ENCOUNTER — Telehealth: Payer: Self-pay

## 2011-08-04 NOTE — Telephone Encounter (Signed)
Got phone # from Priscille Kluver - patient does not have RW - called and left message for him to make appt.

## 2011-08-05 ENCOUNTER — Encounter (HOSPITAL_COMMUNITY): Payer: Self-pay | Admitting: *Deleted

## 2011-08-05 ENCOUNTER — Emergency Department (HOSPITAL_COMMUNITY)
Admission: EM | Admit: 2011-08-05 | Discharge: 2011-08-05 | Disposition: A | Discharge status: Home / Self Care | Payer: Self-pay | Attending: Emergency Medicine | Admitting: Emergency Medicine

## 2011-08-05 ENCOUNTER — Emergency Department (HOSPITAL_COMMUNITY): Payer: Self-pay

## 2011-08-05 DIAGNOSIS — R05 Cough: Secondary | ICD-10-CM | POA: Insufficient documentation

## 2011-08-05 DIAGNOSIS — F1010 Alcohol abuse, uncomplicated: Secondary | ICD-10-CM | POA: Insufficient documentation

## 2011-08-05 DIAGNOSIS — R059 Cough, unspecified: Secondary | ICD-10-CM | POA: Insufficient documentation

## 2011-08-05 DIAGNOSIS — J45901 Unspecified asthma with (acute) exacerbation: Secondary | ICD-10-CM | POA: Insufficient documentation

## 2011-08-05 DIAGNOSIS — R000 Tachycardia, unspecified: Secondary | ICD-10-CM | POA: Insufficient documentation

## 2011-08-05 DIAGNOSIS — Z21 Asymptomatic human immunodeficiency virus [HIV] infection status: Secondary | ICD-10-CM | POA: Insufficient documentation

## 2011-08-05 DIAGNOSIS — F10929 Alcohol use, unspecified with intoxication, unspecified: Secondary | ICD-10-CM

## 2011-08-05 DIAGNOSIS — R0789 Other chest pain: Secondary | ICD-10-CM | POA: Insufficient documentation

## 2011-08-05 LAB — DIFFERENTIAL
Basophils Absolute: 0 10*3/uL (ref 0.0–0.1)
Eosinophils Relative: 0 % (ref 0–5)
Lymphocytes Relative: 41 % (ref 12–46)
Monocytes Absolute: 0.9 10*3/uL (ref 0.1–1.0)

## 2011-08-05 LAB — POCT I-STAT, CHEM 8
BUN: 12 mg/dL (ref 6–23)
Calcium, Ion: 1.13 mmol/L (ref 1.12–1.32)
Glucose, Bld: 120 mg/dL — ABNORMAL HIGH (ref 70–99)
TCO2: 19 mmol/L (ref 0–100)

## 2011-08-05 LAB — CBC
HCT: 43.2 % (ref 39.0–52.0)
MCH: 32.9 pg (ref 26.0–34.0)
MCHC: 35.4 g/dL (ref 30.0–36.0)
MCV: 92.9 fL (ref 78.0–100.0)
RDW: 12.9 % (ref 11.5–15.5)

## 2011-08-05 MED ORDER — ALBUTEROL SULFATE (5 MG/ML) 0.5% IN NEBU
5.0000 mg | INHALATION_SOLUTION | Freq: Once | RESPIRATORY_TRACT | Status: AC
  Administered 2011-08-05: 5 mg via RESPIRATORY_TRACT

## 2011-08-05 MED ORDER — ALBUTEROL SULFATE HFA 108 (90 BASE) MCG/ACT IN AERS
INHALATION_SPRAY | RESPIRATORY_TRACT | Status: AC
  Filled 2011-08-05: qty 6.7

## 2011-08-05 MED ORDER — IPRATROPIUM BROMIDE 0.02 % IN SOLN
0.5000 mg | Freq: Once | RESPIRATORY_TRACT | Status: AC
  Administered 2011-08-05: 0.5 mg via RESPIRATORY_TRACT

## 2011-08-05 MED ORDER — ALBUTEROL SULFATE HFA 108 (90 BASE) MCG/ACT IN AERS
2.0000 | INHALATION_SPRAY | Freq: Four times a day (QID) | RESPIRATORY_TRACT | Status: DC
  Administered 2011-08-05: 2 via RESPIRATORY_TRACT

## 2011-08-05 MED ORDER — IPRATROPIUM BROMIDE 0.02 % IN SOLN
RESPIRATORY_TRACT | Status: DC
Start: 2011-08-05 — End: 2011-08-05
  Filled 2011-08-05: qty 2.5

## 2011-08-05 MED ORDER — ALBUTEROL SULFATE (5 MG/ML) 0.5% IN NEBU
INHALATION_SOLUTION | RESPIRATORY_TRACT | Status: AC
  Filled 2011-08-05: qty 1

## 2011-08-05 NOTE — ED Notes (Addendum)
Pt straight back to room via w/c after presenting to triage, ambulatory with assistance, orthopneic, increased wob, wheezing, resp.distress, unable to speak & tachypneic. RT called to report to POD A room 3.

## 2011-08-05 NOTE — Discharge Instructions (Signed)
Alcohol Intoxication Alcohol intoxication means your blood alcohol level is above legal limits. Alcohol is a drug. It has serious side effects. These side effects can include:  Damage to your organs (liver, nervous system, and blood system).   Unclear thinking.   Slowed reflexes.   Decreased muscle coordination.  HOME CARE  Do not drink and drive.   Do not drink alcohol if you are taking medicine or using other drugs. Doing so can cause serious medical problems or even death.   Drink enough water and fluids to keep your pee (urine) clear or pale yellow.   Eat healthy foods.   Only take medicine as told by your doctor.   Join an alcohol support group.  GET HELP RIGHT AWAY IF:  You become shaky when you stop drinking.   Your thinking is unclear or you become confused.   You throw up (vomit) blood. It may look bright red or like coffee grounds.   You notice blood in your poop (bowel movements).   You become lightheaded or pass out (faint).  MAKE SURE YOU:   Understand these instructions.   Will watch your condition.   Will get help right away if you are not doing well or get worse.  Document Released: 08/18/2007 Document Revised: 02/18/2011 Document Reviewed: 08/18/2009 [facility] Patient Information 2012 [place], [place].Asthma Attack Prevention HOW CAN ASTHMA BE PREVENTED? Currently, there is no way to prevent asthma from starting. However, you can take steps to control the disease and prevent its symptoms after you have been diagnosed. Learn about your asthma and how to control it. Take an active role to control your asthma by working with your caregiver to create and follow an asthma action plan. An asthma action plan guides you in taking your medicines properly, avoiding factors that make your asthma worse, tracking your level of asthma control, responding to worsening asthma, and seeking emergency care when needed. To track your asthma, keep records of your symptoms, check  your peak flow number using a peak flow meter (handheld device that shows how well air moves out of your lungs), and get regular asthma checkups.  Other ways to prevent asthma attacks include:  Use medicines as your caregiver directs.   Identify and avoid things that make your asthma worse (as much as you can).   Keep track of your asthma symptoms and level of control.   Get regular checkups for your asthma.   With your caregiver, write a detailed plan for taking medicines and managing an asthma attack. Then be sure to follow your action plan. Asthma is an ongoing condition that needs regular monitoring and treatment.   Identify and avoid asthma triggers. A number of outdoor allergens and irritants (pollen, mold, cold air, air pollution) can trigger asthma attacks. Find out what causes or makes your asthma worse, and take steps to avoid those triggers (see below).   Monitor your breathing. Learn to recognize warning signs of an attack, such as slight coughing, wheezing or shortness of breath. However, your lung function may already decrease before you notice any signs or symptoms, so regularly measure and record your peak airflow with a home peak flow meter.   Identify and treat attacks early. If you act quickly, you're less likely to have a severe attack. You will also need less medicine to control your symptoms. When your peak flow measurements decrease and alert you to an upcoming attack, take your medicine as instructed, and immediately stop any activity that may have triggered  the attack. If your symptoms do not improve, get medical help.   Pay attention to increasing quick-relief inhaler use. If you find yourself relying on your quick-relief inhaler (such as albuterol), your asthma is not under control. See your caregiver about adjusting your treatment.  IDENTIFY AND CONTROL FACTORS THAT MAKE YOUR ASTHMA WORSE A number of common things can set off or make your asthma symptoms worse (asthma  triggers). Keep track of your asthma symptoms for several weeks, detailing all the environmental and emotional factors that are linked with your asthma. When you have an asthma attack, go back to your asthma diary to see which factor, or combination of factors, might have contributed to it. Once you know what these factors are, you can take steps to control many of them.  Allergies: If you have allergies and asthma, it is important to take asthma prevention steps at home. Asthma attacks (worsening of asthma symptoms) can be triggered by allergies, which can cause temporary increased inflammation of your airways. Minimizing contact with the substance to which you are allergic will help prevent an asthma attack. Animal Dander:   Some people are allergic to the flakes of skin or dried saliva from animals with fur or feathers. Keep these pets out of your home.   If you can't keep a pet outdoors, keep the pet out of your bedroom and other sleeping areas at all times, and keep the door closed.   Remove carpets and furniture covered with cloth from your home. If that is not possible, keep the pet away from fabric-covered furniture and carpets.  Dust Mites:  Many people with asthma are allergic to dust mites. Dust mites are tiny bugs that are found in every home, in mattresses, pillows, carpets, fabric-covered furniture, bedcovers, clothes, stuffed toys, fabric, and other fabric-covered items.   Cover your mattress in a special dust-proof cover.   Cover your pillow in a special dust-proof cover, or wash the pillow each week in hot water. Water must be hotter than 130 F to kill dust mites. Cold or warm water used with detergent and bleach can also be effective.   Wash the sheets and blankets on your bed each week in hot water.   Try not to sleep or lie on cloth-covered cushions.   Call ahead when traveling and ask for a smoke-free hotel room. Bring your own bedding and pillows, in case the hotel only  supplies feather pillows and down comforters, which may contain dust mites and cause asthma symptoms.   Remove carpets from your bedroom and those laid on concrete, if you can.   Keep stuffed toys out of the bed, or wash the toys weekly in hot water or cooler water with detergent and bleach.  Cockroaches:  Many people with asthma are allergic to the droppings and remains of cockroaches.   Keep food and garbage in closed containers. Never leave food out.   Use poison baits, traps, powders, gels, or paste (for example, boric acid).   If a spray is used to kill cockroaches, stay out of the room until the odor goes away.  Indoor Mold:  Fix leaky faucets, pipes, or other sources of water that have mold around them.   Clean moldy surfaces with a cleaner that has bleach in it.  Pollen and Outdoor Mold:  When pollen or mold spore counts are high, try to keep your windows closed.   Stay indoors with windows closed from late morning to afternoon, if you can. Pollen and  some mold spore counts are highest at that time.   Ask your caregiver whether you need to take or increase anti-inflammatory medicine before your allergy season starts.  Irritants:   Tobacco smoke is an irritant. If you smoke, ask your caregiver how you can quit. Ask family members to quit smoking, too. Do not allow smoking in your home or car.   If possible, do not use a wood-burning stove, kerosene heater, or fireplace. Minimize exposure to all sources of smoke, including incense, candles, fires, and fireworks.   Try to stay away from strong odors and sprays, such as perfume, talcum powder, hair spray, and paints.   Decrease humidity in your home and use an indoor air cleaning device. Reduce indoor humidity to below 60 percent. Dehumidifiers or central air conditioners can do this.   Try to have someone else vacuum for you once or twice a week, if you can. Stay out of rooms while they are being vacuumed and for a short  while afterward.   If you vacuum, use a dust mask from a hardware store, a double-layered or microfilter vacuum cleaner bag, or a vacuum cleaner with a HEPA filter.   Sulfites in foods and beverages can be irritants. Do not drink beer or wine, or eat dried fruit, processed potatoes, or shrimp if they cause asthma symptoms.   Cold air can trigger an asthma attack. Cover your nose and mouth with a scarf on cold or windy days.   Several health conditions can make asthma more difficult to manage, including runny nose, sinus infections, reflux disease, psychological stress, and sleep apnea. Your caregiver will treat these conditions, as well.   Avoid close contact with people who have a cold or the flu, since your asthma symptoms may get worse if you catch the infection from them. Wash your hands thoroughly after touching items that may have been handled by people with a respiratory infection.   Get a flu shot every year to protect against the flu virus, which often makes asthma worse for days or weeks. Also get a pneumonia shot once every five to 10 years.  Drugs:  Aspirin and other painkillers can cause asthma attacks. 10% to 20% of people with asthma have sensitivity to aspirin or a group of painkillers called non-steroidal anti-inflammatory drugs (NSAIDS), such as ibuprofen and naproxen. These drugs are used to treat pain and reduce fevers. Asthma attacks caused by any of these medicines can be severe and even fatal. These drugs must be avoided in people who have known aspirin sensitive asthma. Products with acetaminophen are considered safe for people who have asthma. It is important that people with aspirin sensitivity read labels of all over-the-counter drugs used to treat pain, colds, coughs, and fever.   Beta blockers and ACE inhibitors are other drugs which you should discuss with your caregiver, in relation to your asthma.  ALLERGY SKIN TESTING  Ask your asthma caregiver about allergy skin  testing or blood testing (RAST test) to identify the allergens to which you are sensitive. If you are found to have allergies, allergy shots (immunotherapy) for asthma may help prevent future allergies and asthma. With allergy shots, small doses of allergens (substances to which you are allergic) are injected under your skin on a regular schedule. Over a period of time, your body may become used to the allergen and less responsive with asthma symptoms. You can also take measures to minimize your exposure to those allergens. EXERCISE  If you have exercise-induced asthma,  or are planning vigorous exercise, or exercise in cold, humid, or dry environments, prevent exercise-induced asthma by following your caregiver's advice regarding asthma treatment before exercising. Document Released: 02/17/2009 Document Revised: 02/18/2011 Document Reviewed: 02/17/2009 [facility] Patient Information 2012 [place], [place].

## 2011-08-05 NOTE — ED Notes (Signed)
Pt brought by [employer] - pt c/o shortness of breath and hx of asthma - pt speaking complete sentences on arrival, pt w/ exp wheeze to left lung fields clear to rt lung field.

## 2011-08-05 NOTE — ED Provider Notes (Signed)
Care assumed from Dr. Clarene Duke and Conley Rolls.  I agree with their notes, assessment, and plan.  The patient appears well.  No difficulty breathing.  Will discharge to home.    Geoffery Lyons, MD 08/05/11 1002

## 2011-08-05 NOTE — ED Provider Notes (Signed)
History     CSN: [id number]  Arrival date & time 08/05/11  0243   First MD Initiated Contact with Patient 08/05/11 0252      Chief Complaint  Patient presents with  . Asthma    (Consider location/radiation/quality/duration/timing/severity/associated sxs/prior treatment) HPI Comments: Was brought in by the police after being found on a strangers porch smells of alcohol.  He was not having respiratory difficulities on their  arrived but developed "an asthma attack, and respiratory difficulty."  In route to jail  Patient is a 41 y.o. male presenting with asthma. The history is provided by the patient.  Asthma This is a chronic problem. The current episode started today. The problem has been rapidly worsening. Associated symptoms include chest pain and coughing. Pertinent negatives include no fever or neck pain.    Past Medical History  Diagnosis Date  . Asthma   . HIV (human immunodeficiency virus infection)     History reviewed. No pertinent past surgical history.  History reviewed. No pertinent family history.  History  Substance Use Topics  . Smoking status: Current Everyday Smoker -- 0.5 packs/day    Types: Cigarettes  . Smokeless tobacco: Never Used  . Alcohol Use: 3.0 - 3.5 oz/week    6-7 drink(s) per week      Review of Systems  Constitutional: Negative for fever.  HENT: Negative for neck pain.   Respiratory: Positive for cough and chest tightness.   Cardiovascular: Positive for chest pain.  Neurological: Negative for dizziness.    Allergies  Shellfish allergy  Home Medications   Current Outpatient Rx  Name Route Sig Dispense Refill  . ALBUTEROL SULFATE HFA 108 (90 BASE) MCG/ACT IN AERS Inhalation Inhale 2 puffs into the lungs every 6 (six) hours as needed. For shortness of breath      BP 114/64  Pulse 106  Temp(Src) 98.6 F (37 C) (Oral)  Resp 20  SpO2 96%  Physical Exam  Constitutional: He appears well-developed and well-nourished.  HENT:    Head: Normocephalic.  Eyes: Pupils are equal, round, and reactive to light.  Neck: Normal range of motion.  Cardiovascular: Tachycardia present.   Pulmonary/Chest: No respiratory distress. He has wheezes. He has no rales. He exhibits no tenderness.  Abdominal: Soft.  Musculoskeletal: Normal range of motion.  Neurological: He is alert.  Skin: Skin is warm and dry.    ED Course  Procedures (including critical care time)  Labs Reviewed  ETHANOL - Abnormal; Notable for the following:    Alcohol, Ethyl (B) 336 (*)    All other components within normal limits  POCT I-STAT, CHEM 8 - Abnormal; Notable for the following:    Glucose, Bld 120 (*)    All other components within normal limits  CBC  DIFFERENTIAL   No results found.   No diagnosis found.    MDM  Asthma         Arman Filter, NP 08/05/11 2027

## 2011-08-08 NOTE — ED Provider Notes (Signed)
Medical screening examination/treatment/procedure(s) were performed by non-physician practitioner and as supervising physician I was immediately available for consultation/collaboration.   Laray Anger, DO 08/08/11 1226

## 2011-08-11 ENCOUNTER — Inpatient Hospital Stay (HOSPITAL_COMMUNITY)
Admission: AD | Admit: 2011-08-11 | Discharge: 2011-08-16 | DRG: 897 | Disposition: A | Discharge status: Home / Self Care | Payer: Federal, State, Local not specified - Other | Source: Ambulatory Visit | Attending: Psychiatry | Admitting: Psychiatry

## 2011-08-11 ENCOUNTER — Emergency Department (HOSPITAL_COMMUNITY)
Admission: EM | Admit: 2011-08-11 | Discharge: 2011-08-11 | Disposition: A | Discharge status: Other Acute Inpatient Hospital | Payer: Federal, State, Local not specified - Other | Attending: Psychiatry | Admitting: Psychiatry

## 2011-08-11 ENCOUNTER — Encounter (HOSPITAL_COMMUNITY): Payer: Self-pay

## 2011-08-11 DIAGNOSIS — F1210 Cannabis abuse, uncomplicated: Secondary | ICD-10-CM | POA: Insufficient documentation

## 2011-08-11 DIAGNOSIS — Z21 Asymptomatic human immunodeficiency virus [HIV] infection status: Secondary | ICD-10-CM | POA: Diagnosis present

## 2011-08-11 DIAGNOSIS — F1010 Alcohol abuse, uncomplicated: Secondary | ICD-10-CM | POA: Insufficient documentation

## 2011-08-11 DIAGNOSIS — R45851 Suicidal ideations: Secondary | ICD-10-CM | POA: Insufficient documentation

## 2011-08-11 DIAGNOSIS — J45909 Unspecified asthma, uncomplicated: Secondary | ICD-10-CM | POA: Diagnosis present

## 2011-08-11 DIAGNOSIS — F1994 Other psychoactive substance use, unspecified with psychoactive substance-induced mood disorder: Secondary | ICD-10-CM | POA: Diagnosis present

## 2011-08-11 DIAGNOSIS — F1220 Cannabis dependence, uncomplicated: Secondary | ICD-10-CM | POA: Diagnosis present

## 2011-08-11 DIAGNOSIS — R7989 Other specified abnormal findings of blood chemistry: Secondary | ICD-10-CM | POA: Insufficient documentation

## 2011-08-11 DIAGNOSIS — R0602 Shortness of breath: Secondary | ICD-10-CM | POA: Insufficient documentation

## 2011-08-11 HISTORY — DX: Major depressive disorder, single episode, unspecified: F32.9

## 2011-08-11 HISTORY — DX: Mental disorder, not otherwise specified: F99

## 2011-08-11 HISTORY — DX: Depression, unspecified: F32.A

## 2011-08-11 HISTORY — DX: Anxiety disorder, unspecified: F41.9

## 2011-08-11 LAB — COMPREHENSIVE METABOLIC PANEL
AST: 86 U/L — ABNORMAL HIGH (ref 0–37)
Albumin: 3.4 g/dL — ABNORMAL LOW (ref 3.5–5.2)
BUN: 13 mg/dL (ref 6–23)
Calcium: 8.3 mg/dL — ABNORMAL LOW (ref 8.4–10.5)
Creatinine, Ser: 0.87 mg/dL (ref 0.50–1.35)
Total Protein: 6.5 g/dL (ref 6.0–8.3)

## 2011-08-11 LAB — CBC
MCH: 32.3 pg (ref 26.0–34.0)
MCHC: 33.9 g/dL (ref 30.0–36.0)
MCV: 95 fL (ref 78.0–100.0)
Platelets: 260 10*3/uL (ref 150–400)
RDW: 13.1 % (ref 11.5–15.5)
WBC: 5 10*3/uL (ref 4.0–10.5)

## 2011-08-11 LAB — RAPID URINE DRUG SCREEN, HOSP PERFORMED
Amphetamines: NOT DETECTED
Cocaine: NOT DETECTED
Opiates: NOT DETECTED
Tetrahydrocannabinol: POSITIVE — AB

## 2011-08-11 LAB — ETHANOL: Alcohol, Ethyl (B): 117 mg/dL — ABNORMAL HIGH (ref 0–11)

## 2011-08-11 MED ORDER — VITAMIN B-1 100 MG PO TABS
100.0000 mg | ORAL_TABLET | Freq: Every day | ORAL | Status: DC
  Administered 2011-08-11: 100 mg via ORAL
  Filled 2011-08-11: qty 1

## 2011-08-11 MED ORDER — ONDANSETRON 4 MG PO TBDP: 4.0000 mg | ORAL_TABLET | Freq: Four times a day (QID) | ORAL | Status: AC | PRN

## 2011-08-11 MED ORDER — ZOLPIDEM TARTRATE 5 MG PO TABS: 5.0000 mg | ORAL_TABLET | Freq: Every evening | ORAL | Status: DC | PRN

## 2011-08-11 MED ORDER — CHLORDIAZEPOXIDE HCL 25 MG PO CAPS
25.0000 mg | ORAL_CAPSULE | Freq: Once | ORAL | Status: AC
  Administered 2011-08-11: 25 mg via ORAL
  Filled 2011-08-11: qty 1

## 2011-08-11 MED ORDER — NICOTINE 21 MG/24HR TD PT24
21.0000 mg | MEDICATED_PATCH | Freq: Every day | TRANSDERMAL | Status: DC
  Administered 2011-08-12 – 2011-08-16 (×5): 21 mg via TRANSDERMAL
  Filled 2011-08-11 (×7): qty 1

## 2011-08-11 MED ORDER — ACETAMINOPHEN 325 MG PO TABS: 650.0000 mg | ORAL_TABLET | Freq: Four times a day (QID) | ORAL | Status: DC | PRN

## 2011-08-11 MED ORDER — PREDNISONE 20 MG PO TABS
40.0000 mg | ORAL_TABLET | Freq: Once | ORAL | Status: AC
  Administered 2011-08-11: 40 mg via ORAL
  Filled 2011-08-11: qty 2

## 2011-08-11 MED ORDER — CHLORDIAZEPOXIDE HCL 25 MG PO CAPS
25.0000 mg | ORAL_CAPSULE | Freq: Four times a day (QID) | ORAL | Status: AC
  Administered 2011-08-11 – 2011-08-12 (×6): 25 mg via ORAL
  Filled 2011-08-11 (×5): qty 1

## 2011-08-11 MED ORDER — IBUPROFEN 200 MG PO TABS: 600.0000 mg | ORAL_TABLET | Freq: Three times a day (TID) | ORAL | Status: DC | PRN

## 2011-08-11 MED ORDER — METHYLPREDNISOLONE SODIUM SUCC 125 MG IJ SOLR
INTRAMUSCULAR | Status: AC
  Administered 2011-08-11: 01:00:00
  Filled 2011-08-11: qty 2

## 2011-08-11 MED ORDER — ADULT MULTIVITAMIN W/MINERALS CH
1.0000 | ORAL_TABLET | Freq: Every day | ORAL | Status: DC
  Administered 2011-08-11 – 2011-08-16 (×6): 1 via ORAL
  Filled 2011-08-11 (×7): qty 1

## 2011-08-11 MED ORDER — VITAMIN B-1 100 MG PO TABS
100.0000 mg | ORAL_TABLET | Freq: Every day | ORAL | Status: DC
  Administered 2011-08-12 – 2011-08-16 (×5): 100 mg via ORAL
  Filled 2011-08-11 (×6): qty 1

## 2011-08-11 MED ORDER — LOPERAMIDE HCL 2 MG PO CAPS: 2.0000 mg | ORAL_CAPSULE | ORAL | Status: AC | PRN

## 2011-08-11 MED ORDER — CHLORDIAZEPOXIDE HCL 25 MG PO CAPS
25.0000 mg | ORAL_CAPSULE | Freq: Four times a day (QID) | ORAL | Status: AC | PRN
  Filled 2011-08-11: qty 1

## 2011-08-11 MED ORDER — NICOTINE 21 MG/24HR TD PT24
21.0000 mg | MEDICATED_PATCH | Freq: Every day | TRANSDERMAL | Status: DC
  Filled 2011-08-11: qty 1

## 2011-08-11 MED ORDER — THIAMINE HCL 100 MG/ML IJ SOLN: 100.0000 mg | Freq: Every day | INTRAMUSCULAR | Status: DC

## 2011-08-11 MED ORDER — FOLIC ACID 1 MG PO TABS
1.0000 mg | ORAL_TABLET | Freq: Every day | ORAL | Status: DC
  Administered 2011-08-11: 1 mg via ORAL
  Filled 2011-08-11: qty 1

## 2011-08-11 MED ORDER — ACETAMINOPHEN 325 MG PO TABS: 650.0000 mg | ORAL_TABLET | ORAL | Status: DC | PRN

## 2011-08-11 MED ORDER — ALBUTEROL SULFATE (5 MG/ML) 0.5% IN NEBU
5.0000 mg | INHALATION_SOLUTION | Freq: Once | RESPIRATORY_TRACT | Status: AC
  Administered 2011-08-11: 5 mg via RESPIRATORY_TRACT
  Filled 2011-08-11: qty 1
  Filled 2011-08-11: qty 2

## 2011-08-11 MED ORDER — ADULT MULTIVITAMIN W/MINERALS CH
1.0000 | ORAL_TABLET | Freq: Every day | ORAL | Status: DC
  Administered 2011-08-11: 1 via ORAL
  Filled 2011-08-11 (×2): qty 1

## 2011-08-11 MED ORDER — THIAMINE HCL 100 MG/ML IJ SOLN
100.0000 mg | Freq: Once | INTRAMUSCULAR | Status: AC
  Administered 2011-08-11: 100 mg via INTRAMUSCULAR

## 2011-08-11 MED ORDER — MAGNESIUM HYDROXIDE 400 MG/5ML PO SUSP: 30.0000 mL | Freq: Every day | ORAL | Status: DC | PRN

## 2011-08-11 MED ORDER — ALUM & MAG HYDROXIDE-SIMETH 200-200-20 MG/5ML PO SUSP: 30.0000 mL | ORAL | Status: DC | PRN

## 2011-08-11 MED ORDER — ALBUTEROL SULFATE HFA 108 (90 BASE) MCG/ACT IN AERS
1.0000 | INHALATION_SPRAY | RESPIRATORY_TRACT | Status: DC | PRN
  Administered 2011-08-12 – 2011-08-16 (×5): 2 via RESPIRATORY_TRACT
  Filled 2011-08-11: qty 6.7

## 2011-08-11 MED ORDER — CHLORDIAZEPOXIDE HCL 25 MG PO CAPS
25.0000 mg | ORAL_CAPSULE | Freq: Three times a day (TID) | ORAL | Status: AC
  Administered 2011-08-13 (×3): 25 mg via ORAL
  Filled 2011-08-11 (×3): qty 1

## 2011-08-11 MED ORDER — IPRATROPIUM BROMIDE 0.02 % IN SOLN
0.5000 mg | Freq: Once | RESPIRATORY_TRACT | Status: AC
  Administered 2011-08-11: 0.5 mg via RESPIRATORY_TRACT
  Filled 2011-08-11 (×2): qty 2.5

## 2011-08-11 MED ORDER — CHLORDIAZEPOXIDE HCL 25 MG PO CAPS
25.0000 mg | ORAL_CAPSULE | Freq: Every day | ORAL | Status: AC
  Administered 2011-08-15: 25 mg via ORAL
  Filled 2011-08-11: qty 1

## 2011-08-11 MED ORDER — HYDROXYZINE HCL 25 MG PO TABS
25.0000 mg | ORAL_TABLET | Freq: Four times a day (QID) | ORAL | Status: AC | PRN
  Administered 2011-08-13: 25 mg via ORAL

## 2011-08-11 MED ORDER — LORAZEPAM 1 MG PO TABS
1.0000 mg | ORAL_TABLET | Freq: Four times a day (QID) | ORAL | Status: DC | PRN
  Administered 2011-08-11: 1 mg via ORAL
  Filled 2011-08-11: qty 1

## 2011-08-11 MED ORDER — LORAZEPAM 2 MG/ML IJ SOLN: 1.0000 mg | Freq: Four times a day (QID) | INTRAMUSCULAR | Status: DC | PRN

## 2011-08-11 MED ORDER — CHLORDIAZEPOXIDE HCL 25 MG PO CAPS
25.0000 mg | ORAL_CAPSULE | ORAL | Status: AC
  Administered 2011-08-14 (×2): 25 mg via ORAL
  Filled 2011-08-11 (×2): qty 1

## 2011-08-11 MED ORDER — ONDANSETRON HCL 8 MG PO TABS: 4.0000 mg | ORAL_TABLET | Freq: Three times a day (TID) | ORAL | Status: DC | PRN

## 2011-08-11 NOTE — ED Notes (Signed)
Pt ambulated to BR with sitter.  Medically cleared,  Ambulated to [facility].  No complaints of pain or SOB.  CIWA 0 at 0400

## 2011-08-11 NOTE — ED Notes (Signed)
Pt asleep.  Sitter at beside.  Remains on 2l [place] with 02 sats99%.  SR on moniotr
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
## 2011-08-11 NOTE — ED Notes (Signed)
Patient complaining of some anxiety with increased hand tremors and sweating. States he's restless, normally drinks about a 1/5th of liquor a day. Patient medicated with daily meds and prn ativan. Will continue to monitor.

## 2011-08-11 NOTE — Tx Team (Signed)
Initial Interdisciplinary Treatment Plan  PATIENT STRENGTHS: (choose at least two) Ability for insight Average or above average intelligence Communication skills  PATIENT STRESSORS: Substance abuse getting disability and where he will stay when he leaves   PROBLEM LIST: Problem List/Patient Goals Date to be addressed Date deferred Reason deferred Estimated date of resolution  Substance Abuse 08/11/11     Depression 08/11/11                                                DISCHARGE CRITERIA:  Ability to meet basic life and health needs Adequate post-discharge living arrangements Improved stabilization in mood, thinking, and/or behavior  PRELIMINARY DISCHARGE PLAN: Attend aftercare/continuing care group Attend 12-step recovery group Return to previous living arrangement  PATIENT/FAMIILY INVOLVEMENT: This treatment plan has been presented to and reviewed with the patient, John [name], and/or family member.  The patient and family have been given the opportunity to ask questions and make suggestions.  Omelia Blackwater Violon 08/11/2011, 4:44 PM

## 2011-08-11 NOTE — Progress Notes (Signed)
Patient ID: John [name], male   DOB: 1970/08/06, 41 y.o.   MRN: [id number] Pt. Lying in bed, reports depression at "8" of 10. Denies SHI. Pt. Report SI r/t stress and worry, but did not elaborate. Pt. Concerned about asthma inhaler. Writer to ask doctor for order. Pt. Encouraged to attend group. Staff will monitor q58min for safety.

## 2011-08-11 NOTE — ED Notes (Signed)
Meal tray given 

## 2011-08-11 NOTE — ED Provider Notes (Signed)
History     CSN: [id number]  Arrival date & time 08/11/11  0011   First MD Initiated Contact with Patient 08/11/11 0012      Chief Complaint  Patient presents with  . Asthma    Pt had albuterol trt, combivent trt, solu medrol 125mg .  98% RA after 2 treatments.  NS.      (Consider location/radiation/quality/duration/timing/severity/associated sxs/prior treatment) HPI Comments: 41 year old male patient with a history of asthma who presents after a recurrent shortness of breath. According to the medical record the patient has been seen here in approximately 5 weeks ago and then again several days ago for asthma-type symptoms. He states that he still smokes cigarettes and tonight was drinking some alcohol as well when he started to feel increased shortness of breath. The shortness of breath is persistent, nothing makes this better or worse, he states that he is out of his albuterol inhaler and thus called 911. The paramedics gave him albuterol nebulizer that he has had some improvement though he has persistent wheezing and increased work of breathing. He denies any fevers, cough, swelling, chest pain.  The history is provided by the patient, medical records and the EMS personnel.    Past Medical History  Diagnosis Date  . Asthma   . HIV (human immunodeficiency virus infection)     No past surgical history on file.  No family history on file.  History  Substance Use Topics  . Smoking status: Current Everyday Smoker -- 0.5 packs/day    Types: Cigarettes  . Smokeless tobacco: Never Used  . Alcohol Use: 3.0 - 3.5 oz/week    6-7 drink(s) per week      Review of Systems  Constitutional: Negative for fever and chills.  HENT: Negative for ear pain, congestion, sore throat and rhinorrhea.   Eyes: Negative for discharge and redness.  Respiratory: Positive for shortness of breath and wheezing. Negative for cough.   Gastrointestinal: Negative for nausea, vomiting, abdominal pain  and diarrhea.  Genitourinary: Negative for dysuria.  Musculoskeletal: Negative for back pain.  Skin: Negative for rash.  Neurological: Negative for headaches.  Hematological: Negative for adenopathy.    Allergies  Shellfish allergy  Home Medications   Current Outpatient Rx  Name Route Sig Dispense Refill  . ALBUTEROL SULFATE HFA 108 (90 BASE) MCG/ACT IN AERS Inhalation Inhale 1-2 puffs into the lungs as needed. For shortness of breath      BP 134/87  Pulse 109  Temp(Src) 97.9 F (36.6 C) (Oral)  Resp 24  SpO2 99%  Physical Exam  Nursing note and vitals reviewed. Constitutional: He appears well-developed and well-nourished. No distress.  HENT:  Head: Normocephalic and atraumatic.  Mouth/Throat: Oropharynx is clear and moist. No oropharyngeal exudate.       Oropharynx clear, mucous membranes moist  Eyes: Conjunctivae and EOM are normal. Pupils are equal, round, and reactive to light. Right eye exhibits no discharge. Left eye exhibits no discharge. No scleral icterus.  Neck: Normal range of motion. Neck supple. No JVD present. No thyromegaly present.  Cardiovascular: Normal rate, regular rhythm, normal heart sounds and intact distal pulses.  Exam reveals no gallop and no friction rub.   No murmur heard. Pulmonary/Chest: He has wheezes. He has no rales.       Slight tachypnea, expiratory wheezing in all lung fields, speaks in full sentences.  Abdominal: Soft. Bowel sounds are normal. He exhibits no distension and no mass. There is no tenderness.  Musculoskeletal: Normal range of motion.  He exhibits no edema and no tenderness.       No peripheral edema  Lymphadenopathy:    He has no cervical adenopathy.  Neurological: He is alert. Coordination normal.  Skin: Skin is warm and dry. No rash noted. No erythema.  Psychiatric: He has a normal mood and affect. His behavior is normal.    ED Course  Procedures (including critical care time)  Labs Reviewed  CBC - Abnormal;  Notable for the following:    RBC 4.00 (*)    Hemoglobin 12.9 (*)    HCT 38.0 (*)    All other components within normal limits  COMPREHENSIVE METABOLIC PANEL - Abnormal; Notable for the following:    Glucose, Bld 131 (*)    Calcium 8.3 (*)    Albumin 3.4 (*)    AST 86 (*)    ALT 67 (*)    Total Bilirubin 0.2 (*)    All other components within normal limits  ETHANOL - Abnormal; Notable for the following:    Alcohol, Ethyl (B) 117 (*)    All other components within normal limits  URINE RAPID DRUG SCREEN (HOSP PERFORMED) - Abnormal; Notable for the following:    Tetrahydrocannabinol POSITIVE (*)    All other components within normal limits   No results found.   1. Alcohol abuse   2. Suicidal thoughts       MDM  History of reactive airway disease, appears to have wheezing consistent with recurrent episode. Albuterol nebulizer, Atrovent ordered, will start prednisone. Patient will receive prescription for albuterol MDI.   Care was discussed with Berna Spare. of the behavioral health assessment team. The patient will be moved to the [facility] - holding orders written including for CIWA.  Lab show elevated ETOH at 11, mild elevation in LFT's, and MJ positive drug screen.      Vida Roller, MD 08/11/11 [phone]

## 2011-08-11 NOTE — ED Notes (Signed)
Pt sitter at bedside.  Pt changing into blue scrubs.  MD aware of pt's SI/HI.  Pt placing belongings in bag to be locked up.

## 2011-08-11 NOTE — ED Notes (Signed)
House Coverage Kim aware of a need for a sitter. Float Tech sent at this time to sit with pt.
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
## 2011-08-11 NOTE — ED Notes (Signed)
Pt resting in stretcher in NAD, respirations even and unlabored.  Pt denies any needs at this time.  Pt reports feeling like he is breathing much better.

## 2011-08-11 NOTE — ED Notes (Signed)
Pt transferred to [facility] via security and sitter/tech. Pt in NAD.
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
## 2011-08-11 NOTE — ED Notes (Signed)
Breakfast at bedside.

## 2011-08-11 NOTE — ED Notes (Signed)
RT at bedside.

## 2011-08-11 NOTE — ED Provider Notes (Signed)
He was accepted at [facility] by Dr. Darin Engels, MD 08/11/11 [phone]

## 2011-08-12 DIAGNOSIS — F1994 Other psychoactive substance use, unspecified with psychoactive substance-induced mood disorder: Secondary | ICD-10-CM

## 2011-08-12 DIAGNOSIS — F1010 Alcohol abuse, uncomplicated: Principal | ICD-10-CM

## 2011-08-12 MED ORDER — HALOPERIDOL 2 MG PO TABS
2.0000 mg | ORAL_TABLET | Freq: Every day | ORAL | Status: DC
  Administered 2011-08-12 – 2011-08-15 (×4): 2 mg via ORAL
  Filled 2011-08-12: qty 1
  Filled 2011-08-12: qty 2
  Filled 2011-08-12 (×4): qty 1

## 2011-08-12 MED ORDER — TRAZODONE HCL 50 MG PO TABS
50.0000 mg | ORAL_TABLET | Freq: Every day | ORAL | Status: DC
  Administered 2011-08-12 – 2011-08-14 (×3): 50 mg via ORAL
  Filled 2011-08-12 (×5): qty 1

## 2011-08-12 MED ORDER — BENZTROPINE MESYLATE 0.5 MG PO TABS
0.5000 mg | ORAL_TABLET | Freq: Every day | ORAL | Status: DC
  Administered 2011-08-12 – 2011-08-15 (×4): 0.5 mg via ORAL
  Filled 2011-08-12 (×6): qty 1

## 2011-08-12 MED ORDER — CITALOPRAM HYDROBROMIDE 20 MG PO TABS
20.0000 mg | ORAL_TABLET | Freq: Every day | ORAL | Status: DC
  Administered 2011-08-12 – 2011-08-16 (×5): 20 mg via ORAL
  Filled 2011-08-12 (×7): qty 1

## 2011-08-12 NOTE — Progress Notes (Signed)
John [name] has attended and participated in group today.  He reports that he is sleeping well and has a good appetite. He is in bed resting this afternoon. Reports that his depression is unchanged and rates his feelings of hopelessness as an 8.
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
## 2011-08-12 NOTE — Tx Team (Signed)
Interdisciplinary Treatment Plan Update (Adult)  Date:  08/12/2011  Time Reviewed:  10:24 AM   Progress in Treatment: Attending groups: Yes Participating in groups:  Yes, very open and vulnerable in groups Taking medication as prescribed:  Yes Tolerating medication: Yes Family/Significant othe contact made:  Requesting consent to contact supports Patient understands diagnosis: Yes Discussing patient identified problems/goals with staff:  Yes Medical problems stabilized or resolved: Yes Denies suicidal/homicidal ideation: No, reports he is still suicidal and will try not to harm himself while he is here but cannot ensure that he will not act impulsively Issues/concerns per patient self-inventory:  No  Other:  New problem(s) identified: None  Reason for Continuation of Hospitalization: Depression Medication stabilization Suicidal ideation Withdrawal symptoms  Interventions implemented related to continuation of hospitalization:  Medication stabilization, safety checks q 15 mins, group attendance  Additional comments:  Estimated length of stay:  Discharge Plan: John [name] will discharge to [facility] or [facility] for substance abuse treament  New goal(s):  Review of initial/current patient goals per problem list:   1.  Goal(s): Reduce potential for suicide/self-harm  Met:  No  Target date: by discharge  As evidenced by: John [name] reports that he is continuing to have suicidal thoughts and will contract for safety, however, he "cannot promise" that he will not try to harm himself as he often acts impulsively  2.  Goal (s): Decrease ratings of depression to 4 or less  Met:  No  Target date: by discharge  As evidenced by: John [name] rates depression at 9  3.  Goal(s): Decrease rating of anxiety to 4 or less  Met:  No  Target date: by discharge  As evidenced by: John [name] rates anxiety at 9 today  4.  Goal(s): Detox from alcohol  Met:  No  Target date:by discharge  As evidenced by:   John [name] will successfully complete Librium detox protocol  Attendees: Patient:     Family:     Physician:  Dr Orson Aloe, MD 08/12/2011 10:24 AM  Nursing:  Faythe Dingwall, RN 08/12/2011 10:24 AM  Case Manager:  Juline Patch, LCSW 08/12/2011 10:24 AM  Counselor:  Angus Palms, LCSW 08/12/2011 10:24 AM  Other:  Reyes Ivan, LCSWA 08/12/2011 10:24 AM  Other:  Nestor Ramp, RN 08/12/2011 10:24 AM  Other:     Other:      Scribe for Treatment Team:   Billie Lade, 08/12/2011 10:24 AM

## 2011-08-12 NOTE — Progress Notes (Signed)
[facility] MD Progress Note  08/12/2011 8:42 PM  Diagnosis:  Axis I: Alcohol Abuse and Substance Induced Mood Disorder  ADL's:  Intact  Sleep: Poor  Appetite:  Poor  Suicidal Ideation:  Pt has suicidal thoughts, but contracts for safety Homicidal Ideation:  Pt denies having any plans to kill anyone else, he did a few years ago, but none now.  Mental Status Examination/Evaluation: Objective:  Appearance: Disheveled  Eye Contact::  Fair  Speech:  Clear and Coherent  Volume:  Normal  Mood:  Anxious, Depressed, Hopeless and Irritable  Affect:  Congruent  Thought Process:  Coherent  Orientation:  Other:  oriented to place, but not date  Thought Content:  Hallucinations: Auditory Command:  telllilng him to jump out of the window Visual  Suicidal Thoughts:  Yes.  without intent/plan  Homicidal Thoughts:  No  Memory:  Immediate;   Poor  Judgement:  Impaired  Insight:  Lacking  Psychomotor Activity:  Normal  Concentration:  Fair  Recall:  Fair  Akathisia:  No  Handed:  Right  AIMS (if indicated):     Assets:  Communication Skills Desire for Improvement  Sleep:  Number of Hours: 6.75    Vital Signs:Blood pressure 108/69, pulse 76, temperature 98.2 F (36.8 C), temperature source Oral, resp. rate 18, height 5\' 6"  (1.676 m), weight 76.204 kg (168 lb). Current Medications: Current Facility-Administered Medications  Medication Dose Route Frequency Provider Last Rate Last Dose  . acetaminophen (TYLENOL) tablet 650 mg  650 mg Oral Q6H PRN Mike Craze, MD      . albuterol (PROVENTIL HFA;VENTOLIN HFA) 108 (90 BASE) MCG/ACT inhaler 1-2 puff  1-2 puff Inhalation PRN Mike Craze, MD   2 puff at 08/12/11 1741  . alum & mag hydroxide-simeth (MAALOX/MYLANTA) 200-200-20 MG/5ML suspension 30 mL  30 mL Oral Q4H PRN Mike Craze, MD      . benztropine (COGENTIN) tablet 0.5 mg  0.5 mg Oral Q2000 Sanjuana Kava, NP   0.5 mg at 08/12/11 2011  . chlordiazePOXIDE (LIBRIUM) capsule 25 mg  25 mg  Oral Q6H PRN Mike Craze, MD      . chlordiazePOXIDE (LIBRIUM) capsule 25 mg  25 mg Oral QID Mike Craze, MD   25 mg at 08/12/11 1725   Followed by  . chlordiazePOXIDE (LIBRIUM) capsule 25 mg  25 mg Oral TID Mike Craze, MD       Followed by  . chlordiazePOXIDE (LIBRIUM) capsule 25 mg  25 mg Oral BH-qamhs Mike Craze, MD       Followed by  . chlordiazePOXIDE (LIBRIUM) capsule 25 mg  25 mg Oral Daily Mike Craze, MD      . citalopram (CELEXA) tablet 20 mg  20 mg Oral Daily Sanjuana Kava, NP   20 mg at 08/12/11 1726  . haloperidol (HALDOL) tablet 2 mg  2 mg Oral Q2000 Sanjuana Kava, NP   2 mg at 08/12/11 2005  . hydrOXYzine (ATARAX/VISTARIL) tablet 25 mg  25 mg Oral Q6H PRN Mike Craze, MD      . loperamide (IMODIUM) capsule 2-4 mg  2-4 mg Oral PRN Mike Craze, MD      . magnesium hydroxide (MILK OF MAGNESIA) suspension 30 mL  30 mL Oral Daily PRN Mike Craze, MD      . mulitivitamin with minerals tablet 1 tablet  1 tablet Oral Daily Mike Craze, MD   1 tablet at 08/12/11 [phone]  .  nicotine (NICODERM CQ - dosed in mg/24 hours) patch 21 mg  21 mg Transdermal Q0600 Mike Craze, MD   21 mg at 08/12/11 0640  . ondansetron (ZOFRAN-ODT) disintegrating tablet 4 mg  4 mg Oral Q6H PRN Mike Craze, MD      . thiamine (VITAMIN B-1) tablet 100 mg  100 mg Oral Daily Mike Craze, MD   100 mg at 08/12/11 0981    Lab Results:  Results for orders placed during the hospital encounter of 08/11/11 (from the past 48 hour(s))  CBC     Status: Abnormal   Collection Time   08/11/11  1:30 AM      Component Value Range Comment   WBC 5.0  4.0 - 10.5 (K/uL)    RBC 4.00 (*) 4.22 - 5.81 (MIL/uL)    Hemoglobin 12.9 (*) 13.0 - 17.0 (g/dL)    HCT 19.1 (*) 47.8 - 52.0 (%)    MCV 95.0  78.0 - 100.0 (fL)    MCH 32.3  26.0 - 34.0 (pg)    MCHC 33.9  30.0 - 36.0 (g/dL)    RDW 29.5  62.1 - 30.8 (%)    Platelets 260  150 - 400 (K/uL)   COMPREHENSIVE METABOLIC PANEL     Status: Abnormal    Collection Time   08/11/11  1:30 AM      Component Value Range Comment   Sodium 139  135 - 145 (mEq/L)    Potassium 3.6  3.5 - 5.1 (mEq/L)    Chloride 104  96 - 112 (mEq/L)    CO2 20  19 - 32 (mEq/L)    Glucose, Bld 131 (*) 70 - 99 (mg/dL)    BUN 13  6 - 23 (mg/dL)    Creatinine, Ser 6.57  0.50 - 1.35 (mg/dL)    Calcium 8.3 (*) 8.4 - 10.5 (mg/dL)    Total Protein 6.5  6.0 - 8.3 (g/dL)    Albumin 3.4 (*) 3.5 - 5.2 (g/dL)    AST 86 (*) 0 - 37 (U/L)    ALT 67 (*) 0 - 53 (U/L)    Alkaline Phosphatase 82  39 - 117 (U/L)    Total Bilirubin 0.2 (*) 0.3 - 1.2 (mg/dL)    GFR calc non Af Amer >90  >90 (mL/min)    GFR calc Af Amer >90  >90 (mL/min)   ETHANOL     Status: Abnormal   Collection Time   08/11/11  1:30 AM      Component Value Range Comment   Alcohol, Ethyl (B) 117 (*) 0 - 11 (mg/dL)   URINE RAPID DRUG SCREEN (HOSP PERFORMED)     Status: Abnormal   Collection Time   08/11/11  1:46 AM      Component Value Range Comment   Opiates NONE DETECTED  NONE DETECTED     Cocaine NONE DETECTED  NONE DETECTED     Benzodiazepines NONE DETECTED  NONE DETECTED     Amphetamines NONE DETECTED  NONE DETECTED     Tetrahydrocannabinol POSITIVE (*) NONE DETECTED     Barbiturates NONE DETECTED  NONE DETECTED      Physical Findings: AIMS:  , ,  ,  ,    CIWA:  CIWA-Ar Total: 1  COWS:     Treatment Plan Summary: Daily contact with patient to assess and evaluate symptoms and progress in treatment Medication management NO signs or symptoms of withdrawal, mood/anxiety less than 3/10 where 1 is the best and  10 is the worst.  Plan: Admit, start Librium detox, Celexa for depression, Haldol and Cogentin for hallucinations and potential side effects with Haldol.  Explained risks benefits, clinical course with and without treatment.  Pt agrees to try medications.  Juwon Scripter 08/12/2011, 8:42 PM

## 2011-08-12 NOTE — [facility] Counselor (Addendum)
Group Therapy Note  Date:  08/12/2011 Time: 11:00  Group Topic/Focus:  Balance  Participation Level:  Minimal  Participation Quality:  Drowsy  Affect:  Depressed  Cognitive:  Appropriate  Insight:  Good  Engagement in Group:  Limited  Additional Comments:  John [name] was quiet but still participated when asked questions. He reported that playing with his daughter is a pleasurable activity and something he enjoys to do.   Christy Sartorius. 08/12/2011, 12:36 PM      [facility] Group Notes:  (Counselor/Nursing/MHT/Case Management/Adjunct)  08/12/2011 1:15pm [facility]  Type of Therapy:  Group Therapy  Participation Level:  Did Not Attend    Billie Lade 08/12/2011  2:48 PM

## 2011-08-12 NOTE — H&P (Signed)
Medical/psychiatric screening examination/treatment/procedure(s) were performed by non-physician practitioner and as supervising physician I was immediately available for consultation/collaboration.  I have seen and examined this patient and agree the major elements of this evaluation.  
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
## 2011-08-13 NOTE — Progress Notes (Signed)
Patient seen during during d/c planning group.  He continues to endorse SI but able to contract for safety.  Patient rates depression and anxiety at three/four.  He shared that as he looks out of the window it is a though his deceased daughter is calling him to join her.  Patient shared he understand his daughter is not really there.

## 2011-08-13 NOTE — Tx Team (Signed)
Interdisciplinary Treatment Plan Update (Adult)  Date:  08/13/2011  Time Reviewed:  11:04 AM   Progress in Treatment: Attending groups:   Yes   Participating in groups:  Yes Taking medication as prescribed:  Yes Tolerating medication:  Yes Family/Significant othe contact made: No collateral contact to be made Patient understands diagnosis:  Yes Discussing patient identified problems/goals with staff: Yes Medical problems stabilized or resolved: Yes Denies suicidal/homicidal ideation:No, but contracts for safety Issues/concerns per patient self-inventory:  Other:  New problem(s) identified:  Reason for Continuation of Hospitalization: Anxiety Depression Medication stabilization Suicidal ideation  Interventions implemented related to continuation of hospitalization:  Medication Management; safety checks q 15 mins  Additional comments:  Estimated length of stay:  Discharge Plan:  New goal(s):  Review of initial/current patient goals per problem list:    1.  Goal(s): Eliminate SI/other thoughts of self harm   Met:  No - Patient contracts for safety  Target date: d/c  As evidenced by: Patient will no longer endorse SI/other thought self harm  2.  Goal (s): Reduce depression/anxiety (currently rated at three and one   Met: Yes  Target date: d/c  As evidenced by: Patient currently rating symptoms at four or below  3.  Goal(s): .stabilize on meds   Met:  No  Target date: d/c  As evidenced by: Patient will report being stable on medications - symptoms have decreased  4.  Goal(s): Refer for residential treatment   Met:  Yes  Target date: d/c  As evidenced by:  Patient scheduled for admission to [facility] on Wednesday, August 18, 2011  Attendees: Patient:     Family:     Physician:  Orson Aloe, MD 08/13/2011 11:04 AM   Nursing:    08/13/2011 11:04 AM   CaseManager:  Juline Patch, LCSW 08/13/2011 11:04 AM   Counselor:  Angus Palms, LCSW 08/13/2011  11:04 AM    Scribe for Treatment Team:   Wynn Banker, LCSW,  08/13/2011 11:04 AM
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
## 2011-08-13 NOTE — Progress Notes (Addendum)
[facility] Group Notes:  (Counselor/Nursing/MHT/Case Management/Adjunct)  08/13/2011 11:00AM Music As A Relapse Prevention Tool   Type of Therapy:  Group Therapy  Participation Level:  Limited  Participation Quality:  Attentive  Affect:  Appropriate  Cognitive:  Appropriate  Insight:  Limited  Engagement in Group:  Limited  Engagement in Therapy:  Limited  Modes of Intervention:  Support and Exploration  Summary of Progress/Problems: Various songs were played that used different styles and perspectives to express empowerment over a difficult situation. [name] was not very engaged in discussing his personal experience, but seemed to relate well to songs that talk about overcoming loss.   Billie Lade 08/13/2011  2:32 PM

## 2011-08-13 NOTE — Progress Notes (Signed)
Patient's affect brightened throughout day today. Began as irritable but at lunch patient presented with bright affect and even joked around a bit. Patient requested additional sleep medications this PM. Also, patient stated that he is constipated. Patient encouraged to request a PRN at bedtime. Interacting with peers. Attended some groups. Joice Lofts RN MS EdS 08/13/2011  1:52 PM

## 2011-08-13 NOTE — Progress Notes (Signed)
Patient ID: John [name], male   DOB: November 05, 1970, 41 y.o.   MRN: [id number] Pt. Reports AH, voices telling him hurt self, but contracts for safety. Pt. Preoccupied with death of 55 y.o. Daughter who was killed during a drive by shooting, while she was [place] egg hunting last year. Pt. Teary eyed. Pt. Encouraged to tap into spirituality, also writer noted healthy grieving and support groups. Pt. Nods in agreement. Writer reviewed meds. Pt. To see physician this evening. Staff will monitor q24min for safety.

## 2011-08-14 DIAGNOSIS — F1010 Alcohol abuse, uncomplicated: Principal | ICD-10-CM

## 2011-08-14 NOTE — Progress Notes (Signed)
Pt has spent much of the day sleeping. Did not attend the groups. Did go to meals. When asked why he was not going to groups he said that he didn't want to. That he felt very sad and depressed. Given as much support as Pt would allow. 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 Withdrawn and seclusive to his room.

## 2011-08-14 NOTE — Progress Notes (Signed)
[facility] Group Notes:  (Counselor/Nursing/MHT/Case Management/Adjunct)  08/14/2011 7:03 PM  Type of Therapy:  Group Therapy  Participation Level:  Did Not Attend   Neila Gear 08/14/2011, 7:03 PM

## 2011-08-14 NOTE — Progress Notes (Signed)
  John [name] is a 41 y.o. male [id number] [birth year]  08/11/2011 Principal Problem:  *Alcohol abuse, continuous Active Problems:  Psychoactive substance-induced organic mood disorder   Mental Status: drowsy seen in room in bed. Not suicidal today but still has AH. Hears daughters voice and birds.     Subjective/Objective: Quite drowsy had a brief conversation.    Filed Vitals:   08/14/11 0701  BP: 99/63  Pulse: 98  Temp:   Resp:     Lab Results:   BMET    Component Value Date/Time   NA 139 08/11/2011 0130   K 3.6 08/11/2011 0130   CL 104 08/11/2011 0130   CO2 20 08/11/2011 0130   GLUCOSE 131* 08/11/2011 0130   BUN 13 08/11/2011 0130   CREATININE 0.87 08/11/2011 0130   CREATININE 0.88 05/11/2011 1001   CALCIUM 8.3* 08/11/2011 0130   GFRNONAA >90 08/11/2011 0130   GFRAA >90 08/11/2011 0130    Medications:  Scheduled:     . benztropine  0.5 mg Oral Q2000  . chlordiazePOXIDE  25 mg Oral TID   Followed by  . chlordiazePOXIDE  25 mg Oral BH-qamhs   Followed by  . chlordiazePOXIDE  25 mg Oral Daily  . citalopram  20 mg Oral Daily  . haloperidol  2 mg Oral Q2000  . mulitivitamin with minerals  1 tablet Oral Daily  . nicotine  21 mg Transdermal Q0600  . thiamine  100 mg Oral Daily  . traZODone  50 mg Oral QHS     PRN Meds acetaminophen, albuterol, alum & mag hydroxide-simeth, chlordiazePOXIDE, hydrOXYzine, loperamide, magnesium hydroxide, ondansetron  Plan: continue current plan of care.   Tashai Catino,MICKIE D. 08/14/2011

## 2011-08-14 NOTE — Progress Notes (Signed)
[facility] Adult Inpatient Family/Significant Other Suicide Prevention Education  Suicide Prevention Education:  Patient Refusal for Family/Significant Other Suicide Prevention Education: The patient John [name] has refused to provide written consent for family/significant other to be provided Family/Significant Other Suicide Prevention Education during admission and/or prior to discharge.  Physician notified.  Neila Gear 08/14/2011, 5:47 PM

## 2011-08-15 MED ORDER — BISACODYL 5 MG PO TBEC: 10.0000 mg | DELAYED_RELEASE_TABLET | Freq: Every day | ORAL | Status: DC | PRN

## 2011-08-15 MED ORDER — TRAZODONE HCL 100 MG PO TABS
100.0000 mg | ORAL_TABLET | Freq: Every evening | ORAL | Status: DC | PRN
  Administered 2011-08-15: 100 mg via ORAL
  Filled 2011-08-15 (×4): qty 1

## 2011-08-15 NOTE — Progress Notes (Signed)
  John [name] is a 41 y.o. male [id number] Jun 10, 1970  08/11/2011 Principal Problem:  *Alcohol abuse, continuous Active Problems:  Psychoactive substance-induced organic mood disorder   Mental Status: Mood more alert and brighter. Denies SI/HI +AH has had since age 63.    Subjective/Objective: Asks for med for constipation and to change his sleep med-didn't sleep well last night.   Filed Vitals:   08/15/11 0814  BP: 130/81  Pulse: 106  Temp:   Resp:     Lab Results:   BMET    Component Value Date/Time   NA 139 08/11/2011 0130   K 3.6 08/11/2011 0130   CL 104 08/11/2011 0130   CO2 20 08/11/2011 0130   GLUCOSE 131* 08/11/2011 0130   BUN 13 08/11/2011 0130   CREATININE 0.87 08/11/2011 0130   CREATININE 0.88 05/11/2011 1001   CALCIUM 8.3* 08/11/2011 0130   GFRNONAA >90 08/11/2011 0130   GFRAA >90 08/11/2011 0130    Medications:  Scheduled:     . benztropine  0.5 mg Oral Q2000  . chlordiazePOXIDE  25 mg Oral BH-qamhs   Followed by  . chlordiazePOXIDE  25 mg Oral Daily  . citalopram  20 mg Oral Daily  . haloperidol  2 mg Oral Q2000  . mulitivitamin with minerals  1 tablet Oral Daily  . nicotine  21 mg Transdermal Q0600  . thiamine  100 mg Oral Daily  . traZODone  50 mg Oral QHS     PRN Meds acetaminophen, albuterol, alum & mag hydroxide-simeth, chlordiazePOXIDE, hydrOXYzine, loperamide, magnesium hydroxide, ondansetron  Plan : continue current plan of care.  Asher Torpey,MICKIE D. 08/15/2011

## 2011-08-15 NOTE — Progress Notes (Signed)
Patient has been lying in bed restless, reports that he can't go to sleep. Patient received his 2000 meds. Reported that he hears voices telling him to come to the window and jump and visual hallucinations of his daughter running at the foot of his bed. Safety maintained, support and encouragement offered.

## 2011-08-15 NOTE — Progress Notes (Signed)
Pt. Was sleeping in the quiet room this morning. Was able to come to the med room get his medications. Talked with the Pt about helping himself by bathing and going to the groups. Was able to go to the group, partisipated minimally and took a shower. Clothing was washed. Given support and reassurance. Admits to SI, contracts for safety

## 2011-08-16 MED ORDER — HALOPERIDOL 2 MG PO TABS: 2.0000 mg | ORAL_TABLET | Freq: Every day | ORAL | Status: DC

## 2011-08-16 MED ORDER — CITALOPRAM HYDROBROMIDE 20 MG PO TABS: 20.0000 mg | ORAL_TABLET | Freq: Every day | ORAL | Status: DC

## 2011-08-16 MED ORDER — BENZTROPINE MESYLATE 0.5 MG PO TABS: 0.5000 mg | ORAL_TABLET | Freq: Every day | ORAL | Status: DC

## 2011-08-16 MED ORDER — ALBUTEROL SULFATE HFA 108 (90 BASE) MCG/ACT IN AERS: 1.0000 | INHALATION_SPRAY | RESPIRATORY_TRACT | Status: DC | PRN

## 2011-08-16 MED ORDER — TRAZODONE HCL 100 MG PO TABS: 100.0000 mg | ORAL_TABLET | Freq: Every evening | ORAL | Status: DC | PRN

## 2011-08-16 NOTE — Progress Notes (Signed)
[facility] MD Progress Note  08/16/2011 1:49 PM  Diagnosis:  Axis I: Alcohol Abuse and Substance Induced Mood Disorder  ADL's:  Intact  Sleep: Good  Appetite:  Good  Suicidal Ideation:  Pt has suicidal thoughts, but contracts for safety Homicidal Ideation:  Pt denies having any plans to kill anyone else, he did a few years ago, but none now.  Mental Status Examination/Evaluation: Objective:  Appearance: Disheveled  Eye Contact::  Fair  Speech:  Clear and Coherent  Volume:  Normal  Mood:  Euthymic  Affect:  Congruent  Thought Process:  Coherent  Orientation:  Full  Thought Content:  WDL  Suicidal Thoughts:  No  Homicidal Thoughts:  No  Memory:  Immediate;   Fair  Judgement:  Impaired  Insight:  Lacking  Psychomotor Activity:  Normal  Concentration:  Good  Recall:  Good  Akathisia:  No  Handed:  Right  AIMS (if indicated):     Assets:  Communication Skills Desire for Improvement  Sleep:  Number of Hours: 6.75    Vital Signs:Blood pressure 120/80, pulse 94, temperature 97.7 F (36.5 C), temperature source Oral, resp. rate 20, height 5\' 6"  (1.676 m), weight 76.204 kg (168 lb). Current Medications: Current Facility-Administered Medications  Medication Dose Route Frequency Provider Last Rate Last Dose  . acetaminophen (TYLENOL) tablet 650 mg  650 mg Oral Q6H PRN Mike Craze, MD      . albuterol (PROVENTIL HFA;VENTOLIN HFA) 108 (90 BASE) MCG/ACT inhaler 1-2 puff  1-2 puff Inhalation PRN Mike Craze, MD   2 puff at 08/15/11 1808  . alum & mag hydroxide-simeth (MAALOX/MYLANTA) 200-200-20 MG/5ML suspension 30 mL  30 mL Oral Q4H PRN Mike Craze, MD      . benztropine (COGENTIN) tablet 0.5 mg  0.5 mg Oral Q2000 Sanjuana Kava, NP   0.5 mg at 08/15/11 1947  . bisacodyl (DULCOLAX) EC tablet 10 mg  10 mg Oral Daily PRN Mickie D. Adams, PA      . citalopram (CELEXA) tablet 20 mg  20 mg Oral Daily Sanjuana Kava, NP   20 mg at 08/16/11 1610  . haloperidol (HALDOL) tablet 2 mg  2 mg  Oral Q2000 Sanjuana Kava, NP   2 mg at 08/15/11 1947  . magnesium hydroxide (MILK OF MAGNESIA) suspension 30 mL  30 mL Oral Daily PRN Mike Craze, MD      . mulitivitamin with minerals tablet 1 tablet  1 tablet Oral Daily Mike Craze, MD   1 tablet at 08/16/11 [phone]  . nicotine (NICODERM CQ - dosed in mg/24 hours) patch 21 mg  21 mg Transdermal Q0600 Mike Craze, MD   21 mg at 08/16/11 0622  . thiamine (VITAMIN B-1) tablet 100 mg  100 mg Oral Daily Mike Craze, MD   100 mg at 08/16/11 5409  . traZODone (DESYREL) tablet 100 mg  100 mg Oral QHS,MR X 1 Mickie D. Adams, PA   100 mg at 08/15/11 2138    Lab Results:  No results found for this or any previous visit (from the past 48 hour(s)).  Physical Findings: AIMS:  , ,  ,  ,    CIWA:  CIWA-Ar Total: 0  COWS:     Treatment Plan Summary: Daily contact with patient to assess and evaluate symptoms and progress in treatment Medication management NO signs or symptoms of withdrawal, mood/anxiety less than 3/10 where 1 is the best and 10 is the worst.  Plan: Pt sees no need for him to undergo residential treatment at this time.  He as apparently not reached hi bottom and will have to experience more loss before he is convinced that alcohol will indeed kill him and leave him dead.  He admitted that he had not looked at his every drink of alcohol as a veiled attempt to kill himself.  He is declining the referral to [facility] at this time.  He will be given the phone number for that place which he can call himself later when he has gained a more sustained experience that the addictions will kill him and leave him dead.  Mateen Franssen Aug 19, 2011, 1:49 PM

## 2011-08-16 NOTE — Progress Notes (Signed)
[facility] Group Notes:  (Counselor/Nursing/MHT/Case Management/Adjunct)  08/16/2011  11:00am Overcoming Obstacles to Wellness  Type of Therapy:  Group Therapy  Participation Level:  Did Not Attend     John [name] 08/16/2011  2:54 PM      [facility] Group Notes: (Counselor/Nursing/MHT/Case Management/Adjunct) 08/16/2011   @1 :15pm Mindfulness Meditation for Self-Acceptance  Type of Therapy:  Group Therapy  Participation Level:  Minimal  Participation Quality:  Attentive  Affect:  Appropriate  Cognitive:  Appropriate  Insight:  None  Engagement in Group: None  Engagement in Therapy:  None  Modes of Intervention:  Support and Exploration  Summary of Progress/Problems:  John [name]  was attentive but not engaged in group process  John [name] 08/16/2011 2:54 PM

## 2011-08-16 NOTE — Tx Team (Addendum)
Interdisciplinary Treatment Plan Update (Adult)  Date:  08/16/2011  Time Reviewed:  10:57 AM   Progress in Treatment: Attending groups: Yes Participating in groups:  Yes Taking medication as prescribed:  Yes Tolerating medication: Yes Family/Significant othe contact made:  John [name] [name] has refused to give consent for contact with support system Patient understands diagnosis: Yes Discussing patient identified problems/goals with staff:  Yes Medical problems stabilized or resolved: Yes Denies suicidal/homicidal ideation: Yes Issues/concerns per patient self-inventory:  No  Other:  New problem(s) identified: None  Reason for Continuation of Hospitalization: Appropriate   Interventions implemented related to continuation of hospitalization:  Medication stabilization, safety checks q 15 mins, group attendance  Additional comments:  Estimated length of stay: discharge today  Discharge Plan: Discharge today and present for admission to [facility] on Wednesday, June 5  New goal(s):  Review of initial/current patient goals per problem list:    Goal(s): Eliminate SI/other thoughts of self harm  Met: Yes Target date: d/c  As evidenced by: John [name] [name] reports no thoughts of SI or self-harm   2. Goal (s): Reduce depression/anxiety to rating of 4 or below  Met: Yes  Target date: d/c  As evidenced by: John [name] [name] rates anxiety and depression at 0 today   3. Goal(s): .stabilize on meds  Met: Yes  Target date: d/c  As evidenced [id number] reports that the medications are working - especially sleep medications- and have no intolerable side effects  4. Goal(s): Refer for residential treatment  Met: Yes  Target date: d/c  As evidenced by: Deferred- Patient was scheduled for admission to [facility] on Wednesday, August 18, 2011 but is now declining treatment.  He is outpatient to follow up for medication managment   Attendees: Patient:  John [name] [name] 08/16/2011 10:57 AM  Family:     Physician:   Dr Orson Aloe, MD 08/16/2011 10:57 AM  Nursing:   Alease Frame, RN 08/16/2011 10:57 AM  Case Manager:  Juline Patch, LCSW 08/16/2011 10:57 AM  Counselor:  Angus Palms, LCSW 08/16/2011 10:57 AM  Other:  Reyes Ivan, LCSWA 08/16/2011 10:57 AM  Other:  Corinne Ports, doctoral psych inter 08/16/2011 10:57 AM  Other:     Other:      Scribe for Treatment Team:   Billie Lade, 08/16/2011 10:57 AM
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
## 2011-08-16 NOTE — Progress Notes (Signed)
Writer observed patient lying in bed resting, easily aroused when his name was called. Writer informed patient of his scheduled 2000 meds to be given which he received. Patient denies having pain, -si/hi but reports + auditory and visual hallucinations, Patient has bathed and hygiene has improved. Safety maintained, pt. Supported and encouraged,

## 2011-08-16 NOTE — Progress Notes (Signed)
Patient being discharged from [facility] w/scripts, drug samples, instructions and a bus pass to get home, attended group today, took meds as ordered, attended meals in the DR and in all had a good day, denies Si or Hi. Signed release papers as well.

## 2011-08-16 NOTE — Progress Notes (Addendum)
[facility] Case Management Discharge Plan:  Will you be returning to the same living situation after discharge: No. At discharge, do you have transportation home?:No.  Patient assisted with bus pass Do you have the ability to pay for your medications:Yes,  Patient assisted with indigent medications  Interagency Information:     Release of information consent forms completed and in the chart;  Patient's signature needed at discharge.  Patient to Follow up at:  Follow-up Information    Follow up with [facility] on 08/17/2011. (Please go to [facility] walk-in clinic on Tuesday, August 17, 2011 or any weekday between 8:00 AM-3:00 PM)    Contact information:   [address] [place], [place] [address]  [phone]         Patient denies SI/HI:   Yes,  Patient is no longer endorsing SI    Safety Planning and Suicide Prevention discussed:  Yes,  Reviewed during aftercare group  Barrier to discharge identified:Yes,  Patient has limited support system and refused scheduled residential treatment  Summary and Recommendations:  Patient has decided against residential treatment and has refused counseling.  He states he will follow up with medication management   Koji Niehoff, [place] July 08/16/2011, 11:21 AM
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
## 2011-08-17 NOTE — Progress Notes (Signed)
Patient Discharge Instructions:  After Visit Summary (AVS):   Faxed to:  08/17/2011 Psychiatric Admission Assessment Note:   Faxed to:  08/17/2011 Suicide Risk Assessment - Discharge Assessment:   Faxed to:  08/17/2011 Faxed/Sent to the Next Level Care provider:  08/17/2011  Fax to: [facility] @  [phone]  Wandra Scot, 08/17/2011, 5:36 PM

## 2011-08-19 NOTE — Discharge Summary (Signed)
I agree with this D/C Summary.  

## 2011-08-30 ENCOUNTER — Ambulatory Visit (INDEPENDENT_AMBULATORY_CARE_PROVIDER_SITE_OTHER): Payer: Federal, State, Local not specified - Other | Admitting: *Deleted

## 2011-08-30 VITALS — BP 111/68 | HR 66 | Temp 98.5°F | Resp 16 | Wt 171.5 lb

## 2011-08-30 DIAGNOSIS — B20 Human immunodeficiency virus [HIV] disease: Secondary | ICD-10-CM

## 2011-08-30 DIAGNOSIS — Z21 Asymptomatic human immunodeficiency virus [HIV] infection status: Secondary | ICD-10-CM

## 2011-08-30 LAB — LIPID PANEL
HDL: 57 mg/dL (ref >39)
Total CHOL/HDL Ratio: 2.4 Ratio
VLDL: 15 mg/dL (ref 0–40)

## 2011-08-30 LAB — POCT URINALYSIS DIPSTICK
Ketones, UA: NEGATIVE
Leukocytes, UA: NEGATIVE
Protein, UA: NEGATIVE
pH, UA: 5.5

## 2011-08-30 LAB — COMPREHENSIVE METABOLIC PANEL
AST: 20 U/L (ref 0–37)
Alkaline Phosphatase: 71 U/L (ref 39–117)
BUN: 13 mg/dL (ref 6–23)
Calcium: 9.5 mg/dL (ref 8.4–10.5)
Chloride: 106 mEq/L (ref 96–112)
Creat: 0.93 mg/dL (ref 0.50–1.35)
Total Bilirubin: 0.8 mg/dL (ref 0.3–1.2)

## 2011-08-30 NOTE — Progress Notes (Signed)
Patient here for his 12 month START study appt. He was recently at [facility] for depression, suicidal ideation and alcoholism. Arrangements were made for him to go to a detox center but he backed out. He says he is now ready. He continues to hear voices, is depressed, still drinking every day(4-5 drinks) He denies any plans for suicide but is worried about what might happen. Denies any current GI issues. He plans to go to [facility] within 2 days. He contracted for safety and knows he should go to ED right away if he plans to hurt himself or others.

## 2011-09-01 LAB — HIV-1 RNA QUANT-NO REFLEX-BLD: HIV 1 RNA Quant: 1848 copies/mL — ABNORMAL HIGH (ref <20)

## 2011-09-04 ENCOUNTER — Encounter (HOSPITAL_COMMUNITY): Payer: Self-pay | Admitting: Emergency Medicine

## 2011-09-04 ENCOUNTER — Emergency Department (HOSPITAL_COMMUNITY)
Admission: EM | Admit: 2011-09-04 | Discharge: 2011-09-07 | Disposition: A | Discharge status: Home / Self Care | Payer: Self-pay | Attending: Emergency Medicine | Admitting: Emergency Medicine

## 2011-09-04 DIAGNOSIS — Z21 Asymptomatic human immunodeficiency virus [HIV] infection status: Secondary | ICD-10-CM | POA: Insufficient documentation

## 2011-09-04 DIAGNOSIS — R45851 Suicidal ideations: Secondary | ICD-10-CM | POA: Insufficient documentation

## 2011-09-04 DIAGNOSIS — F3289 Other specified depressive episodes: Secondary | ICD-10-CM | POA: Insufficient documentation

## 2011-09-04 DIAGNOSIS — F329 Major depressive disorder, single episode, unspecified: Secondary | ICD-10-CM | POA: Insufficient documentation

## 2011-09-04 DIAGNOSIS — Z79899 Other long term (current) drug therapy: Secondary | ICD-10-CM | POA: Insufficient documentation

## 2011-09-04 LAB — RAPID URINE DRUG SCREEN, HOSP PERFORMED
Amphetamines: NOT DETECTED
Cocaine: NOT DETECTED
Opiates: NOT DETECTED
Tetrahydrocannabinol: NOT DETECTED

## 2011-09-04 LAB — COMPREHENSIVE METABOLIC PANEL
ALT: 15 U/L (ref 0–53)
Albumin: 3.6 g/dL (ref 3.5–5.2)
Alkaline Phosphatase: 66 U/L (ref 39–117)
Calcium: 8.9 mg/dL (ref 8.4–10.5)
Potassium: 3.4 mEq/L — ABNORMAL LOW (ref 3.5–5.1)
Sodium: 138 mEq/L (ref 135–145)
Total Protein: 6.8 g/dL (ref 6.0–8.3)

## 2011-09-04 LAB — CBC
MCH: 32.1 pg (ref 26.0–34.0)
MCHC: 34.1 g/dL (ref 30.0–36.0)
RDW: 12.7 % (ref 11.5–15.5)

## 2011-09-04 LAB — SALICYLATE LEVEL: Salicylate Lvl: <2 mg/dL — ABNORMAL LOW (ref 2.8–20.0)

## 2011-09-04 LAB — ETHANOL: Alcohol, Ethyl (B): 176 mg/dL — ABNORMAL HIGH (ref 0–11)

## 2011-09-04 MED ORDER — HALOPERIDOL 2 MG PO TABS
2.0000 mg | ORAL_TABLET | Freq: Two times a day (BID) | ORAL | Status: DC
  Administered 2011-09-04 – 2011-09-07 (×6): 2 mg via ORAL
  Filled 2011-09-04 (×6): qty 1

## 2011-09-04 MED ORDER — TRAZODONE HCL 100 MG PO TABS
100.0000 mg | ORAL_TABLET | Freq: Every evening | ORAL | Status: DC | PRN
  Administered 2011-09-04 – 2011-09-06 (×3): 100 mg via ORAL
  Filled 2011-09-04 (×3): qty 1

## 2011-09-04 MED ORDER — ALUM & MAG HYDROXIDE-SIMETH 200-200-20 MG/5ML PO SUSP: 30.0000 mL | ORAL | Status: DC | PRN

## 2011-09-04 MED ORDER — LORAZEPAM 1 MG PO TABS
1.0000 mg | ORAL_TABLET | Freq: Three times a day (TID) | ORAL | Status: DC | PRN
  Administered 2011-09-04: 1 mg via ORAL
  Filled 2011-09-04: qty 1

## 2011-09-04 MED ORDER — BENZTROPINE MESYLATE 1 MG PO TABS: 0.5000 mg | ORAL_TABLET | Freq: Two times a day (BID) | ORAL | Status: DC | PRN

## 2011-09-04 MED ORDER — ALBUTEROL SULFATE HFA 108 (90 BASE) MCG/ACT IN AERS
1.0000 | INHALATION_SPRAY | RESPIRATORY_TRACT | Status: DC | PRN
  Administered 2011-09-05 – 2011-09-06 (×2): 2 via RESPIRATORY_TRACT

## 2011-09-04 MED ORDER — ZOLPIDEM TARTRATE 5 MG PO TABS
5.0000 mg | ORAL_TABLET | Freq: Every evening | ORAL | Status: DC | PRN
  Administered 2011-09-04 – 2011-09-06 (×3): 5 mg via ORAL
  Filled 2011-09-04 (×3): qty 1

## 2011-09-04 MED ORDER — ONDANSETRON HCL 4 MG PO TABS
4.0000 mg | ORAL_TABLET | Freq: Three times a day (TID) | ORAL | Status: DC | PRN
  Administered 2011-09-04: 4 mg via ORAL
  Filled 2011-09-04 (×2): qty 1

## 2011-09-04 MED ORDER — CITALOPRAM HYDROBROMIDE 20 MG PO TABS
20.0000 mg | ORAL_TABLET | Freq: Every day | ORAL | Status: DC
Start: 2011-09-05 — End: 2011-09-07
  Administered 2011-09-05 – 2011-09-07 (×3): 20 mg via ORAL
  Filled 2011-09-04 (×3): qty 1

## 2011-09-04 MED ORDER — NICOTINE 21 MG/24HR TD PT24
21.0000 mg | MEDICATED_PATCH | Freq: Every day | TRANSDERMAL | Status: DC
  Administered 2011-09-04 – 2011-09-07 (×4): 21 mg via TRANSDERMAL
  Filled 2011-09-04 (×4): qty 1

## 2011-09-04 MED ORDER — ALBUTEROL SULFATE HFA 108 (90 BASE) MCG/ACT IN AERS
2.0000 | INHALATION_SPRAY | RESPIRATORY_TRACT | Status: DC | PRN
  Administered 2011-09-04 – 2011-09-07 (×4): 2 via RESPIRATORY_TRACT
  Filled 2011-09-04: qty 6.7

## 2011-09-04 MED ORDER — IBUPROFEN 600 MG PO TABS: 600.0000 mg | ORAL_TABLET | Freq: Three times a day (TID) | ORAL | Status: DC | PRN

## 2011-09-04 NOTE — ED Notes (Signed)
Pts wallet found in bed Room#16, driver's license, [employer] card and other cards in it. No money. Will put wallet with all the other belongings.

## 2011-09-04 NOTE — ED Provider Notes (Signed)
History     CSN: [id number]  Arrival date & time 09/04/11  0146   First MD Initiated Contact with Patient 09/04/11 [phone]      Chief Complaint  Patient presents with  . Drug Overdose   HPI  History provided by the patient. Patient is a 41 year old male with history of HIV, anxiety, depression who presents with suicidal ideations. Patient states that he has been having auditory hallucinations and hearing voices telling him he is worthless and should died. Patient has increasing depression and suicidal thoughts. Patient states that he took an excessive amount of medications as a suicidal attempt at home. Patient is unsure of medications but states they were a small pink pills. Patient took these around 10 or 11 PM. Patient also admits to alcohol use this evening. Patient states that he has been a patient at [facility] and feels that he needs to go back. Patient denies any other aggravating or alleviating factors. Patient denies any other associated symptoms. Denies any headache, dizziness, abdominal pain, nausea, vomiting    Past Medical History  Diagnosis Date  . Asthma   . HIV (human immunodeficiency virus infection)   . Mental disorder   . Anxiety   . Depression     History reviewed. No pertinent past surgical history.  History reviewed. No pertinent family history.  History  Substance Use Topics  . Smoking status: Current Everyday Smoker -- 0.5 packs/day    Types: Cigarettes  . Smokeless tobacco: Never Used  . Alcohol Use: 3.0 - 3.5 oz/week    6-7 drink(s) per week      Review of Systems  Constitutional: Negative for fever and chills.  Respiratory: Negative for cough and shortness of breath.   Cardiovascular: Negative for chest pain.  Gastrointestinal: Negative for nausea, vomiting and abdominal pain.  Neurological: Negative for dizziness and headaches.    Allergies  Shellfish allergy  Home Medications   Current Outpatient Rx  Name Route Sig Dispense Refill  .  ALBUTEROL SULFATE HFA 108 (90 BASE) MCG/ACT IN AERS Inhalation Inhale 1-2 puffs into the lungs as needed. For shortness of breath    . BENZTROPINE MESYLATE 0.5 MG PO TABS Oral Take 1 tablet (0.5 mg total) by mouth daily at 8 pm. For involuntary movements 30 tablet 0  . CITALOPRAM HYDROBROMIDE 20 MG PO TABS Oral Take 1 tablet (20 mg total) by mouth daily. For depression 30 tablet 0  . HALOPERIDOL 2 MG PO TABS Oral Take 1 tablet (2 mg total) by mouth daily at 8 pm. For mood control 30 tablet 0  . TRAZODONE HCL 100 MG PO TABS Oral Take 1 tablet (100 mg total) by mouth at bedtime and may repeat dose one time if needed. For sleep 30 tablet 0    BP 115/63  Pulse 86  Temp 98.9 F (37.2 C) (Oral)  Resp 17  SpO2 97%  Physical Exam  Nursing note and vitals reviewed. Constitutional: He is oriented to person, place, and time. He appears well-developed and well-nourished. No distress.  HENT:  Head: Normocephalic and atraumatic.  Eyes: Conjunctivae and EOM are normal. Pupils are equal, round, and reactive to light.  Cardiovascular: Normal rate and regular rhythm.   Pulmonary/Chest: Effort normal and breath sounds normal.  Abdominal: Soft. There is no tenderness. There is no rebound and no guarding.  Neurological: He is alert and oriented to person, place, and time.  Skin: Skin is warm.  Psychiatric: His behavior is normal. He exhibits a depressed mood. He  expresses suicidal ideation.    ED Course  Procedures   Results for orders placed during the hospital encounter of 09/04/11  CBC      Component Value Range   WBC 5.7  4.0 - 10.5 K/uL   RBC 4.17 (*) 4.22 - 5.81 MIL/uL   Hemoglobin 13.4  13.0 - 17.0 g/dL   HCT 45.4  09.8 - 11.9 %   MCV 94.2  78.0 - 100.0 fL   MCH 32.1  26.0 - 34.0 pg   MCHC 34.1  30.0 - 36.0 g/dL   RDW 14.7  82.9 - 56.2 %   Platelets 359  150 - 400 K/uL  COMPREHENSIVE METABOLIC PANEL      Component Value Range   Sodium 138  135 - 145 mEq/L   Potassium 3.4 (*) 3.5 - 5.1  mEq/L   Chloride 102  96 - 112 mEq/L   CO2 22  19 - 32 mEq/L   Glucose, Bld 91  70 - 99 mg/dL   BUN 10  6 - 23 mg/dL   Creatinine, Ser 1.30  0.50 - 1.35 mg/dL   Calcium 8.9  8.4 - 86.5 mg/dL   Total Protein 6.8  6.0 - 8.3 g/dL   Albumin 3.6  3.5 - 5.2 g/dL   AST 19  0 - 37 U/L   ALT 15  0 - 53 U/L   Alkaline Phosphatase 66  39 - 117 U/L   Total Bilirubin 0.3  0.3 - 1.2 mg/dL   GFR calc non Af Amer >90  >90 mL/min   GFR calc Af Amer >90  >90 mL/min  ETHANOL      Component Value Range   Alcohol, Ethyl (B) 176 (*) 0 - 11 mg/dL  ACETAMINOPHEN LEVEL      Component Value Range   Acetaminophen (Tylenol), Serum <15.0  10 - 30 ug/mL  SALICYLATE LEVEL      Component Value Range   Salicylate Lvl <2.0 (*) 2.8 - 20.0 mg/dL  URINE RAPID DRUG SCREEN (HOSP PERFORMED)      Component Value Range   Opiates NONE DETECTED  NONE DETECTED   Cocaine NONE DETECTED  NONE DETECTED   Benzodiazepines NONE DETECTED  NONE DETECTED   Amphetamines NONE DETECTED  NONE DETECTED   Tetrahydrocannabinol NONE DETECTED  NONE DETECTED   Barbiturates NONE DETECTED  NONE DETECTED        1. Suicidal ideation       MDM  Patient seen and evaluated. Patient no acute distress.   Spoke with [facility] act team.  They will see patient and evaluate. Psych holding orders in place.     Angus Seller, [place] 09/04/11 [phone]

## 2011-09-04 NOTE — ED Provider Notes (Signed)
Medical screening examination/treatment/procedure(s) were performed by non-physician practitioner and as supervising physician I was immediately available for consultation/collaboration.   Hanley Seamen, MD 09/04/11 [phone]

## 2011-09-04 NOTE — ED Notes (Signed)
Pt began vomiting after breakfast, and c/o wheezing.  Breath sounds decreased bilat w/ exp wheezes. EDP aware, additional orders received

## 2011-09-04 NOTE — Accreditation Note (Signed)
Dr. Elsie Saas at [facility] declined pt as pt had no trace of Xanax in his system despite pt's assertion that he'd swallowed 20 pills.

## 2011-09-04 NOTE — ED Notes (Signed)
1 bag locked in locker # 43 

## 2011-09-04 NOTE — ED Notes (Signed)
Up to the bathroom 
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
## 2011-09-04 NOTE — ED Notes (Signed)
Per EMS pt is bipolar and has schizophrenia has been having audible hallucinations  Pt states he took 20 prednisone and 10 xanax mg unknown for both  Pt states he has been off his medications for the past 3 weeks  Pt having SI/HI  Pt has ingested ETOH as well  2 40 oz beers tonight per pt

## 2011-09-04 NOTE — ED Notes (Signed)
Feeling better, nausea resolved, breathing better, cool cloth given

## 2011-09-04 NOTE — ED Notes (Addendum)
Up to the bathroom 

## 2011-09-04 NOTE — ED Notes (Signed)
ACT into see 

## 2011-09-04 NOTE — ED Notes (Signed)
Act Team aware of patient referral.

## 2011-09-04 NOTE — ED Notes (Signed)
Up tot he bathroom to shower and change scrubs 

## 2011-09-04 NOTE — ED Notes (Signed)
Pt walked to Psych ED with tech.

## 2011-09-05 NOTE — ED Notes (Signed)
Up to the bathroom, when returning to room pt approached the desk requesting his inhaler-audible wheezing noted.
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
## 2011-09-05 NOTE — ED Notes (Signed)
Referral faxed to [facility] as possible disposition. Intake staff informed writer that they currently do not have any vacant beds today but will review referral tomorrow 09/06/11 when a vacancy arises.     Janann Colonel., MSW, [facility] Clinical Social Worker [phone]

## 2011-09-05 NOTE — ED Provider Notes (Signed)
BP 105/63  Pulse 60  Temp 97.3 F (36.3 C) (Oral)  Resp 15  SpO2 97% No new issue overnight. Awaiting placement.   Loren Racer, MD 09/05/11 [phone]

## 2011-09-05 NOTE — ED Notes (Signed)
Up to the bathroom 

## 2011-09-05 NOTE — ED Notes (Signed)
Up in room, watching tv 

## 2011-09-06 DIAGNOSIS — F1994 Other psychoactive substance use, unspecified with psychoactive substance-induced mood disorder: Secondary | ICD-10-CM

## 2011-09-06 DIAGNOSIS — F1010 Alcohol abuse, uncomplicated: Secondary | ICD-10-CM

## 2011-09-06 DIAGNOSIS — F1210 Cannabis abuse, uncomplicated: Secondary | ICD-10-CM

## 2011-09-06 NOTE — BHH Counselor (Signed)
Dr Shela Commons [facility] declined as pt had no trace of Xanax in system despite claim he took 20 pills.  [facility] will review 6/24 when bed is available. However, there is no sponsorship funding available at [facility]. Pt also has no insurance and will need to make payment arrangements if accepted and prior to going into their facility.  This will probably not be an option!!  Pending evaluation by Dr. Carmelina Dane or telepsychiatry today. Patients disposition will be determined once psych evaluation has been completed and recommendations for treatment are given.

## 2011-09-06 NOTE — ED Notes (Signed)
Pt. Had tely psych consult at 1230

## 2011-09-06 NOTE — ED Provider Notes (Signed)
Pt with depression, intermittent hallucinations.  Pt resting comfortably.  Discussed w act team, awaiting placement.    Suzi Roots, MD 09/06/11 [phone]

## 2011-09-07 NOTE — ED Provider Notes (Signed)
Pt here for treatment for issues with increasing hallucinations associated with his schizophrenia.    Pt resting comfortably this am without complaints.  Filed Vitals:   09/07/11 0625  BP: 104/62  Pulse: 73  Temp: 97.4 F (36.3 C)  Resp: 18     Celene Kras, MD 09/07/11 [phone]

## 2011-09-07 NOTE — BHH Counselor (Signed)
Evaluated by the psychiatrist Dr. Elsie Saas. He recommended outpatient psychiatric services at [facility]. Psychiatrist made no medication changes during this visit. Also notes, patient does not meet criteria for acute psychiatric hospitalization at this time. Contacted EDP-Dr. Rennis Chris and made him aware of patients disposition. He agreed to discharge patient accordingly. Patients nurse-Mike also made aware and will complete the discharge process.    Writer provided with outpatient referrals to several community resources including [facility], mobile crises, local therapist and psychiatrist, support groups, etc.

## 2011-09-07 NOTE — Consult Note (Signed)
Reason for Consult: Depression, psychosis, and malingering Referring Physician: Dr. Corrin Parker is an 41 y.o. male.  HPI: Patient was seen and the chart reviewed. Patient was presented to the [facility] long emergency department with the chief complaints of depression and auditory hallucinations, which are commanding in nature. Patient was the recently admitted to [facility] for the alcohol intoxication and the alcohol-induced mood disorder with the psychotic symptoms. Patient has questionable compliance with his medication and never visited the outpatient psychiatric services as per disposition plan. Patient reportedly continued to abuse drug of abuse which is marijuana and drinking alcohol. Reportedly Patient's half sister, does not want to give his medications because of him continues to be drinking. Patient was the recently diagnosed with the human immunodeficiency virus positive while on probation and attending remedial classes and has a long history of asthma. Patient grew up in grandmother's home with the 2 siblings because his mother was alcohol dependent, not able to take responsibility of children. Patient mother passively with cirrhosis of liver. Patient dropped out of the school as a ninth grader because he want to hang around with his friends said not interested in school. He reported his grandmother kicked him out of the home when he was 46. He has been living with the friend who was a 32 years old, working in [place] in [place]. He endorses a normal sexual relationship and the panel, sexual activity is the cause for positive HIV. 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 He applied for the disability, which did not come through yet. Patient has been changing his story from time to time, and person to person. Patient requested to be hospitalized but when told him . He was  declined by behavioral [facility] and he takes time to be placed outside [place]. He he agreed to participate in outpatient psychiatric services and is ready to go home. Patient denies current suicidal or homicidal ideations, intentions, plans. Patient has no command hallucinations, auditory hallucinations, or tactile hallucinations at this time. Patient has no history of of for chronic mental health services. Patient has been seen by infectious disease for the study, which he was not enrolled. Patient has a more secondary gains to be visiting again and again  emergency department then seeking treatment for substance abuse or mental health.  Patient was found staying in his bed without discomfort or distress. He has no abnormal psychomotor activity. He stated mood was fine. His affect was appropriate bright and full. 3 has been normal rate, rhythm, and volume of speech. His thought processes linear and goal-directed. He denied current suicidal or homicidal ideations, intentions and plans. He has no evidence of for acute psychosis. He does not appear to be responding/ interacting with internal stimuli. He seems to have a poor insight, judgment and impulse control.  Past Medical History  Diagnosis Date  . Asthma   . HIV (human immunodeficiency virus infection)   . Mental disorder   . Anxiety   . Depression     History reviewed. No pertinent past surgical history.  History reviewed. No pertinent family history.  Social History:  reports that he has been smoking Cigarettes.  He has been smoking about .5 packs per day. He has never used smokeless tobacco. He reports that he does not drink alcohol or use illicit drugs.  Allergies:  Allergies  Allergen Reactions  . Shellfish Allergy Anaphylaxis and Swelling  Medications: I have reviewed the patient's current medications.  No results found for this or any previous visit (from the past 48 hour(s)).  No results found.  Positive for bad  mood, behavior problems, excessive alcohol consumption, illegal drug usage and Seeking secondary gains and malingering Blood pressure 99/63, pulse 73, temperature 98 F (36.7 C), temperature source Oral, resp. rate 18, SpO2 97.00%.   Assessment/Plan: Substance related mood disorder. Alcohol abuse. Marijuana abuse. R/OMalingering  Recommended outpatient psychiatric services at [facility]. No medication changes were made during this visit. Patient does not meet criteria for acute psychiatric hospitalization at this time.  Skylee Baird,JANARDHAHA R. 09/07/2011, 4:52 PM

## 2011-09-07 NOTE — Discharge Instructions (Signed)
Call any of the numbers that you were given today to get psychiatric counseling. If you have thought of harming yourself call 911

## 2011-09-07 NOTE — ED Provider Notes (Signed)
5:10 PM is alert pleasant cooperative no distress. Glasgow Coma Score 15. He will be discharged to home with outpatient referrals psychiatric counseling. Dr.Johnlagada's consult noted  Doug Sou, MD 09/07/11 [phone]

## 2011-09-10 ENCOUNTER — Encounter: Payer: Self-pay | Admitting: Internal Medicine

## 2011-09-10 LAB — CD4/CD8 (T-HELPER/T-SUPPRESSOR CELL)
CD4: 601
CD8 % Suppressor T Cell: 50.5
CD8: 909

## 2011-09-17 ENCOUNTER — Encounter: Payer: Self-pay | Admitting: Internal Medicine

## 2011-09-26 ENCOUNTER — Emergency Department (HOSPITAL_COMMUNITY): Payer: Self-pay

## 2011-09-26 ENCOUNTER — Emergency Department (HOSPITAL_COMMUNITY)
Admission: EM | Admit: 2011-09-26 | Discharge: 2011-09-26 | Disposition: A | Discharge status: Home / Self Care | Payer: Self-pay | Attending: Emergency Medicine | Admitting: Emergency Medicine

## 2011-09-26 DIAGNOSIS — F341 Dysthymic disorder: Secondary | ICD-10-CM | POA: Insufficient documentation

## 2011-09-26 DIAGNOSIS — F17200 Nicotine dependence, unspecified, uncomplicated: Secondary | ICD-10-CM | POA: Insufficient documentation

## 2011-09-26 DIAGNOSIS — Z21 Asymptomatic human immunodeficiency virus [HIV] infection status: Secondary | ICD-10-CM | POA: Insufficient documentation

## 2011-09-26 DIAGNOSIS — Z79899 Other long term (current) drug therapy: Secondary | ICD-10-CM | POA: Insufficient documentation

## 2011-09-26 DIAGNOSIS — J45901 Unspecified asthma with (acute) exacerbation: Secondary | ICD-10-CM | POA: Insufficient documentation

## 2011-09-26 LAB — CBC WITH DIFFERENTIAL/PLATELET
Basophils Absolute: 0 10*3/uL (ref 0.0–0.1)
Basophils Relative: 0 % (ref 0–1)
HCT: 40.8 % (ref 39.0–52.0)
Hemoglobin: 14.3 g/dL (ref 13.0–17.0)
Lymphocytes Relative: 62 % — ABNORMAL HIGH (ref 12–46)
MCHC: 35 g/dL (ref 30.0–36.0)
Monocytes Absolute: 0.6 10*3/uL (ref 0.1–1.0)
Neutro Abs: 2.7 10*3/uL (ref 1.7–7.7)
Neutrophils Relative %: 28 % — ABNORMAL LOW (ref 43–77)
RDW: 12.8 % (ref 11.5–15.5)
WBC: 9.5 10*3/uL (ref 4.0–10.5)

## 2011-09-26 LAB — POCT I-STAT, CHEM 8
BUN: 15 mg/dL (ref 6–23)
Calcium, Ion: 1.12 mmol/L (ref 1.12–1.23)
Chloride: 108 mEq/L (ref 96–112)
HCT: 43 % (ref 39.0–52.0)
Potassium: 3.4 mEq/L — ABNORMAL LOW (ref 3.5–5.1)

## 2011-09-26 MED ORDER — ALBUTEROL (5 MG/ML) CONTINUOUS INHALATION SOLN
INHALATION_SOLUTION | RESPIRATORY_TRACT | Status: AC
  Filled 2011-09-26: qty 80

## 2011-09-26 MED ORDER — PREDNISONE 20 MG PO TABS: 40.0000 mg | ORAL_TABLET | Freq: Every day | ORAL | Status: AC

## 2011-09-26 MED ORDER — ALBUTEROL SULFATE HFA 108 (90 BASE) MCG/ACT IN AERS
2.0000 | INHALATION_SPRAY | Freq: Once | RESPIRATORY_TRACT | Status: AC
  Administered 2011-09-26: 2 via RESPIRATORY_TRACT
  Filled 2011-09-26: qty 6.7

## 2011-09-26 MED ORDER — LORAZEPAM 2 MG/ML IJ SOLN
2.0000 mg | Freq: Once | INTRAMUSCULAR | Status: AC
  Administered 2011-09-26: 2 mg via INTRAVENOUS
  Filled 2011-09-26: qty 1

## 2011-09-26 MED ORDER — ALBUTEROL SULFATE HFA 108 (90 BASE) MCG/ACT IN AERS: 2.0000 | INHALATION_SPRAY | RESPIRATORY_TRACT | Status: DC | PRN

## 2011-09-26 MED ORDER — METHYLPREDNISOLONE SODIUM SUCC 125 MG IJ SOLR
INTRAMUSCULAR | Status: AC
  Filled 2011-09-26: qty 2

## 2011-09-26 MED ORDER — IPRATROPIUM BROMIDE 0.02 % IN SOLN
RESPIRATORY_TRACT | Status: AC
  Filled 2011-09-26: qty 2.5

## 2011-09-26 MED ORDER — ALBUTEROL (5 MG/ML) CONTINUOUS INHALATION SOLN
10.0000 mg/h | INHALATION_SOLUTION | RESPIRATORY_TRACT | Status: AC
  Administered 2011-09-26: 10 mg/h via RESPIRATORY_TRACT
  Filled 2011-09-26: qty 20

## 2011-09-26 NOTE — ED Provider Notes (Signed)
History     CSN: [id number]  Arrival date & time 09/26/11  0865   First MD Initiated Contact with Patient 09/26/11 [phone]      Chief Complaint  Patient presents with  . Respiratory Distress    (Consider location/radiation/quality/duration/timing/severity/associated sxs/prior treatment) HPI Comments: The patient is a 41 year old male with a history of severe asthma, HIV, mental health disease who presents with a complaint of difficulty breathing. He states it started 2 days ago, has been persistent, nothing seems to make it better, gradually getting worse, not associated with fevers chills chest pain back pain swelling rashes dysuria diarrhea or abdominal pain. The shortness of breath is persistent, he was found by paramedics to have oxygen level of 98-99% on room air but to be tachypneic and in moderate respiratory distress. He was given albuterol nebulizer 5 mg with 0.5 mg of Atrovent, this was repeated x1 and he was also given Solu-Medrol 125 mg prior to arrival. This has slightly improved his symptoms. He has been out of his albuterol inhaler for several days. Review of the medical record shows that the patient is here frequently for asthma attacks.  The history is provided by the patient and medical records.    Past Medical History  Diagnosis Date  . Asthma   . HIV (human immunodeficiency virus infection)   . Mental disorder   . Anxiety   . Depression     No past surgical history on file.  No family history on file.  History  Substance Use Topics  . Smoking status: Current Everyday Smoker -- 0.5 packs/day    Types: Cigarettes  . Smokeless tobacco: Never Used  . Alcohol Use: No     Pt denies      Review of Systems  All other systems reviewed and are negative.    Allergies  Shellfish allergy  Home Medications   Current Outpatient Rx  Name Route Sig Dispense Refill  . ALBUTEROL SULFATE HFA 108 (90 BASE) MCG/ACT IN AERS Inhalation Inhale 1-2 puffs into the lungs  as needed. For shortness of breath    . ALBUTEROL SULFATE HFA 108 (90 BASE) MCG/ACT IN AERS Inhalation Inhale 2 puffs into the lungs every 4 (four) hours as needed for wheezing or shortness of breath. 1 Inhaler 3  . HALOPERIDOL 2 MG PO TABS Oral Take 1 tablet (2 mg total) by mouth daily at 8 pm. For mood control 30 tablet 0  . PREDNISONE 20 MG PO TABS Oral Take 2 tablets (40 mg total) by mouth daily. 10 tablet 0    BP 122/65  Pulse 98  Temp 97.7 F (36.5 C) (Axillary)  Resp 14  Ht 5\' 6"  (1.676 m)  Wt 160 lb (72.576 kg)  BMI 25.82 kg/m2  SpO2 95%  Physical Exam  Nursing note and vitals reviewed. Constitutional: He appears well-developed and well-nourished. He appears distressed.  HENT:  Head: Normocephalic and atraumatic.  Mouth/Throat: Oropharynx is clear and moist. No oropharyngeal exudate.  Eyes: Conjunctivae and EOM are normal. Pupils are equal, round, and reactive to light. Right eye exhibits no discharge. Left eye exhibits no discharge. No scleral icterus.  Neck: Normal range of motion. Neck supple. No JVD present. No thyromegaly present.  Cardiovascular: Regular rhythm, normal heart sounds and intact distal pulses.  Tachycardia present.  Exam reveals no gallop and no friction rub.   No murmur heard. Pulmonary/Chest: He is in respiratory distress. He has wheezes. He has no rales.       Diffuse expiratory  wheezing, no rales, speaks in short sentences, tachypneic.  Sat's 99% on ra  Abdominal: Soft. Bowel sounds are normal. He exhibits no distension and no mass. There is no tenderness.  Musculoskeletal: Normal range of motion. He exhibits no edema and no tenderness.  Lymphadenopathy:    He has no cervical adenopathy.  Neurological: He is alert. Coordination normal.  Skin: Skin is warm and dry. No rash noted. No erythema.  Psychiatric: He has a normal mood and affect. His behavior is normal.    ED Course  Procedures (including critical care time)  Labs Reviewed  CBC WITH  DIFFERENTIAL - Abnormal; Notable for the following:    Neutrophils Relative 28 (*)     Lymphocytes Relative 62 (*)     Lymphs Abs 5.9 (*)     All other components within normal limits  POCT I-STAT, CHEM 8 - Abnormal; Notable for the following:    Potassium 3.4 (*)     All other components within normal limits   Dg Chest 2 View  09/26/2011  *RADIOLOGY REPORT*  Clinical Data: Cough, shortness of breath, asthma.  CHEST - 2 VIEW  Comparison: 08/05/2011  Findings: Mild peribronchial thickening.  Hyperinflation, with hemidiaphragm flattening. Mild lingular and retrocardiac opacities. No pleural effusion or pneumothorax.  No acute osseous finding.  IMPRESSION:  Hyperinflation and peribronchial thickening is a pattern that can be seen with reactive airway disease.  Mild lingular and retrocardiac opacity; atelectasis or less likely infiltrate.  Original Report Authenticated By: Waneta Martins, M.D.     1. Asthma attack       MDM  Check labs, CXR due to distress, ativan for anxiety, albuterol continuous and monitor for improvement.  Doubt other sources s/a CHF (no swelling, CP or hx of same) and PE (no swelling, hypoxia)    ED ECG REPORT  I personally interpreted this EKG   Date: 09/26/2011   Rate: 106  Rhythm: normal sinus rhythm  QRS Axis: normal  Intervals: normal  ST/T Wave abnormalities: normal  Conduction Disutrbances:none  Narrative Interpretation: Poor R-wave progression  Old EKG Reviewed: Compared with 10/18/2009 no significant changes are seen   Patient reevaluated, heart rate at 110, oxygen at 98%, minimal nasal cannula supplemental oxygen at this time. On reexamination his chest has normal lung sounds, no wheezing, no rales, no respiratory distress, respiratory rate of 18 at this time. Chest x-ray interpreted by myself showing mild atelectasis and a retrocardiac location, lab work shows no leukocytosis, he is not febrile, has no electrolyte abnormalities. Will give albuterol  inhaler for home, patient denies any other symptoms, has no psychiatric symptoms that he is actively complaining about.  Discharge Prescriptions include:  Albuterol MDI Prednisone     Vida Roller, MD 09/26/11 [phone]

## 2011-09-26 NOTE — ED Notes (Signed)
PT TRANSPORTED TO XRAY 

## 2011-09-26 NOTE — ED Notes (Signed)
The patient states that he first started feeling mildly short of breath on 0713/13 @ 1830, but he "didn't pay much attention to it."  The patient states that he normally uses albuterol, however he has run out of his medicine.  The patient states he is HIV+, but he is not taking any antiviral cocktails.  The patient received Solu-medrol 125 mg, iv en route from EMS.  EMS also gave two breathing treatments (one duoneb and one albuterol).

## 2011-10-16 ENCOUNTER — Encounter (HOSPITAL_COMMUNITY): Payer: Self-pay | Admitting: *Deleted

## 2011-10-16 ENCOUNTER — Emergency Department (HOSPITAL_COMMUNITY)
Admission: EM | Admit: 2011-10-16 | Discharge: 2011-10-17 | Disposition: A | Discharge status: Home / Self Care | Payer: Self-pay | Attending: Emergency Medicine | Admitting: Emergency Medicine

## 2011-10-16 DIAGNOSIS — F3289 Other specified depressive episodes: Secondary | ICD-10-CM | POA: Insufficient documentation

## 2011-10-16 DIAGNOSIS — F17200 Nicotine dependence, unspecified, uncomplicated: Secondary | ICD-10-CM | POA: Insufficient documentation

## 2011-10-16 DIAGNOSIS — Z21 Asymptomatic human immunodeficiency virus [HIV] infection status: Secondary | ICD-10-CM | POA: Insufficient documentation

## 2011-10-16 DIAGNOSIS — J45909 Unspecified asthma, uncomplicated: Secondary | ICD-10-CM

## 2011-10-16 DIAGNOSIS — F329 Major depressive disorder, single episode, unspecified: Secondary | ICD-10-CM | POA: Insufficient documentation

## 2011-10-16 DIAGNOSIS — F411 Generalized anxiety disorder: Secondary | ICD-10-CM | POA: Insufficient documentation

## 2011-10-16 MED ORDER — ALBUTEROL SULFATE (5 MG/ML) 0.5% IN NEBU
5.0000 mg | INHALATION_SOLUTION | Freq: Once | RESPIRATORY_TRACT | Status: AC
  Administered 2011-10-16: 5 mg via RESPIRATORY_TRACT
  Filled 2011-10-16: qty 1

## 2011-10-16 NOTE — ED Notes (Signed)
Patient requesting something to eat and drink; informed patient that the EDP has to order and run tests (and tests to result) before patient is allowed to have anything to eat or drink, per hospital policy.

## 2011-10-16 NOTE — ED Notes (Addendum)
Pt. Complains on SOB x 1 month with activity. Pt. Has been without his albuterol inhaler x 2 days. Requesting refill. Respirations even and unlabored. Diminished in lower lungs bilaterally. Expiratory wheezing noted in right lobe. Denies pain. A.O. X 2.

## 2011-10-16 NOTE — ED Notes (Signed)
Pt states he ran out of rescue inhaler 2 days ago. Pt has been SOB and wheezing since two days ago. Pt SOB in triage.

## 2011-10-17 MED ORDER — PREDNISONE 20 MG PO TABS: 60.0000 mg | ORAL_TABLET | Freq: Once | ORAL | Status: DC

## 2011-10-17 MED ORDER — PREDNISONE 20 MG PO TABS: 40.0000 mg | ORAL_TABLET | Freq: Every day | ORAL | Status: DC

## 2011-10-17 MED ORDER — ALBUTEROL SULFATE HFA 108 (90 BASE) MCG/ACT IN AERS
1.0000 | INHALATION_SPRAY | Freq: Four times a day (QID) | RESPIRATORY_TRACT | Status: DC | PRN
Start: 2011-10-17 — End: 2011-10-17
  Administered 2011-10-17: 2 via RESPIRATORY_TRACT
  Filled 2011-10-17 (×2): qty 6.7

## 2011-10-17 NOTE — ED Notes (Signed)
Patient given "happy meal" ([place] sandwich, pretzels, and applesauce) upon discharge.

## 2011-10-17 NOTE — ED Notes (Signed)
Pt. Verbalized. Need to fill prescription. Demonstrated proper use of inhaler. Verbalized need to follow up with PCP. Vitals stable. Ambulatory. Denies Pain. No further questions at this time.

## 2011-10-17 NOTE — ED Notes (Signed)
Dr. Ghim at bedside. 

## 2011-10-17 NOTE — ED Provider Notes (Signed)
History     CSN: [id number]  Arrival date & time 10/16/11  2228   First MD Initiated Contact with Patient 10/16/11 2357      Chief Complaint  Patient presents with  . Asthma  . Shortness of Breath    (Consider location/radiation/quality/duration/timing/severity/associated sxs/prior treatment) HPI Comments: Patient reports he has a history of asthma and that he ran out of his inhaler approximately 2 days ago. Reports that he's had some chills and that he feels achy. He does feel somewhat short of breath and reports that he was wheezing today. He apparently was given albuterol in triage prior to arrival into the room, and when asked him if he had received the treatment he said no. He asked for an inhaler and also a [place] sandwich.  No abd pain, N/V/D.    Patient is a 41 y.o. male presenting with asthma and shortness of breath. The history is provided by the patient.  Asthma Associated symptoms include shortness of breath. Pertinent negatives include no chest pain and no headaches.  Shortness of Breath  Associated symptoms include cough, shortness of breath and wheezing. Pertinent negatives include no chest pain, no rhinorrhea and no sore throat. His past medical history is significant for asthma.    Past Medical History  Diagnosis Date  . Asthma   . HIV (human immunodeficiency virus infection)   . Mental disorder   . Anxiety   . Depression     History reviewed. No pertinent past surgical history.  History reviewed. No pertinent family history.  History  Substance Use Topics  . Smoking status: Current Everyday Smoker -- 0.5 packs/day    Types: Cigarettes  . Smokeless tobacco: Never Used  . Alcohol Use: No     Pt denies      Review of Systems  Constitutional: Positive for chills. Negative for appetite change.  HENT: Negative for congestion, sore throat, rhinorrhea and trouble swallowing.   Respiratory: Positive for cough, shortness of breath and wheezing.     Cardiovascular: Negative for chest pain.  Gastrointestinal: Negative for nausea and vomiting.  Neurological: Negative for headaches.    Allergies  Shellfish allergy  Home Medications   Current Outpatient Rx  Name Route Sig Dispense Refill  . ALBUTEROL SULFATE HFA 108 (90 BASE) MCG/ACT IN AERS Inhalation Inhale 2 puffs into the lungs every 6 (six) hours as needed. For wheezing    . PREDNISONE 20 MG PO TABS Oral Take 2 tablets (40 mg total) by mouth daily. 12 tablet 0    BP 137/107  Pulse 86  Temp 98.2 F (36.8 C) (Oral)  Resp 20  SpO2 98%  Physical Exam  Vitals reviewed. Constitutional: He appears well-developed and well-nourished. No distress.  HENT:  Head: Normocephalic and atraumatic.  Neck: Normal range of motion. Neck supple.  Cardiovascular: Normal rate and regular rhythm.   No murmur heard. Pulmonary/Chest: Effort normal and breath sounds normal. No stridor. No respiratory distress. He has no wheezes. He has no rales. He exhibits no tenderness.  Abdominal: Soft. He exhibits no distension.  Lymphadenopathy:    He has no cervical adenopathy.  Neurological: He is alert.  Skin: Skin is warm. No rash noted. He is not diaphoretic.    ED Course  Procedures (including critical care time)  Labs Reviewed - No data to display No results found.   1. Asthma       MDM  Pt with no sig wheezing, no fever, exam is otherwise unremarkable. He did run out  of inhaler, will supply him with another.  Will give Rx for short duration of prednisone for presumed bronchitis.  Breath sounds normal.  Doubt pneumonia.          Gavin Pound. Oletta Lamas, MD 10/17/11 1610

## 2011-10-20 ENCOUNTER — Encounter (HOSPITAL_COMMUNITY): Payer: Self-pay

## 2011-10-20 ENCOUNTER — Emergency Department (HOSPITAL_COMMUNITY)
Admission: EM | Admit: 2011-10-20 | Discharge: 2011-10-20 | Disposition: A | Discharge status: Home / Self Care | Payer: Self-pay | Attending: Emergency Medicine | Admitting: Emergency Medicine

## 2011-10-20 DIAGNOSIS — F17200 Nicotine dependence, unspecified, uncomplicated: Secondary | ICD-10-CM | POA: Insufficient documentation

## 2011-10-20 DIAGNOSIS — F329 Major depressive disorder, single episode, unspecified: Secondary | ICD-10-CM | POA: Insufficient documentation

## 2011-10-20 DIAGNOSIS — F10929 Alcohol use, unspecified with intoxication, unspecified: Secondary | ICD-10-CM

## 2011-10-20 DIAGNOSIS — Z21 Asymptomatic human immunodeficiency virus [HIV] infection status: Secondary | ICD-10-CM | POA: Insufficient documentation

## 2011-10-20 DIAGNOSIS — Z79899 Other long term (current) drug therapy: Secondary | ICD-10-CM | POA: Insufficient documentation

## 2011-10-20 DIAGNOSIS — F1010 Alcohol abuse, uncomplicated: Secondary | ICD-10-CM | POA: Insufficient documentation

## 2011-10-20 DIAGNOSIS — F3289 Other specified depressive episodes: Secondary | ICD-10-CM | POA: Insufficient documentation

## 2011-10-20 DIAGNOSIS — F411 Generalized anxiety disorder: Secondary | ICD-10-CM | POA: Insufficient documentation

## 2011-10-20 DIAGNOSIS — J45901 Unspecified asthma with (acute) exacerbation: Secondary | ICD-10-CM | POA: Insufficient documentation

## 2011-10-20 DIAGNOSIS — Z9114 Patient's other noncompliance with medication regimen: Secondary | ICD-10-CM

## 2011-10-20 DIAGNOSIS — J45909 Unspecified asthma, uncomplicated: Secondary | ICD-10-CM | POA: Insufficient documentation

## 2011-10-20 LAB — CBC WITH DIFFERENTIAL/PLATELET
Basophils Absolute: 0 10*3/uL (ref 0.0–0.1)
Eosinophils Relative: 5 % (ref 0–5)
HCT: 39.9 % (ref 39.0–52.0)
Hemoglobin: 13.7 g/dL (ref 13.0–17.0)
Lymphocytes Relative: 63 % — ABNORMAL HIGH (ref 12–46)
Lymphs Abs: 3.3 10*3/uL (ref 0.7–4.0)
MCV: 94.1 fL (ref 78.0–100.0)
Monocytes Absolute: 0.4 10*3/uL (ref 0.1–1.0)
Monocytes Relative: 7 % (ref 3–12)
Neutro Abs: 1.3 10*3/uL — ABNORMAL LOW (ref 1.7–7.7)
RBC: 4.24 MIL/uL (ref 4.22–5.81)
WBC: 5.2 10*3/uL (ref 4.0–10.5)

## 2011-10-20 LAB — POCT I-STAT, CHEM 8
BUN: 6 mg/dL (ref 6–23)
Calcium, Ion: 1.1 mmol/L — ABNORMAL LOW (ref 1.12–1.23)
Chloride: 109 mEq/L (ref 96–112)
Creatinine, Ser: 1.1 mg/dL (ref 0.50–1.35)
Glucose, Bld: 129 mg/dL — ABNORMAL HIGH (ref 70–99)
TCO2: 21 mmol/L (ref 0–100)

## 2011-10-20 MED ORDER — ALBUTEROL (5 MG/ML) CONTINUOUS INHALATION SOLN
INHALATION_SOLUTION | RESPIRATORY_TRACT | Status: AC
  Administered 2011-10-20: 04:00:00
  Filled 2011-10-20: qty 40

## 2011-10-20 MED ORDER — ACETAMINOPHEN 325 MG PO TABS: 650.0000 mg | ORAL_TABLET | ORAL | Status: DC | PRN

## 2011-10-20 MED ORDER — ONDANSETRON HCL 8 MG PO TABS
4.0000 mg | ORAL_TABLET | Freq: Three times a day (TID) | ORAL | Status: DC | PRN
Start: 2011-10-20 — End: 2011-10-20
  Filled 2011-10-20: qty 1

## 2011-10-20 MED ORDER — ALBUTEROL SULFATE HFA 108 (90 BASE) MCG/ACT IN AERS
2.0000 | INHALATION_SPRAY | RESPIRATORY_TRACT | Status: DC
  Administered 2011-10-20: 2 via RESPIRATORY_TRACT
  Filled 2011-10-20: qty 6.7

## 2011-10-20 MED ORDER — ONDANSETRON HCL 4 MG/2ML IJ SOLN
4.0000 mg | Freq: Four times a day (QID) | INTRAMUSCULAR | Status: DC | PRN
  Administered 2011-10-20: 4 mg via INTRAVENOUS
  Filled 2011-10-20: qty 2

## 2011-10-20 MED ORDER — IPRATROPIUM BROMIDE 0.02 % IN SOLN
0.5000 mg | Freq: Once | RESPIRATORY_TRACT | Status: AC
  Administered 2011-10-20: 0.5 mg via RESPIRATORY_TRACT
  Filled 2011-10-20: qty 2.5

## 2011-10-20 MED ORDER — ALBUTEROL (5 MG/ML) CONTINUOUS INHALATION SOLN
10.0000 mg/h | INHALATION_SOLUTION | Freq: Once | RESPIRATORY_TRACT | Status: AC
  Administered 2011-10-20: 10 mg/h via RESPIRATORY_TRACT
  Filled 2011-10-20: qty 20

## 2011-10-20 MED ORDER — METHYLPREDNISOLONE SODIUM SUCC 125 MG IJ SOLR
125.0000 mg | Freq: Once | INTRAMUSCULAR | Status: AC
  Administered 2011-10-20: 125 mg via INTRAVENOUS
  Filled 2011-10-20: qty 2

## 2011-10-20 MED ORDER — LORAZEPAM 1 MG PO TABS
1.0000 mg | ORAL_TABLET | Freq: Three times a day (TID) | ORAL | Status: DC | PRN
  Administered 2011-10-20: 1 mg via ORAL
  Filled 2011-10-20 (×2): qty 1

## 2011-10-20 MED ORDER — NICOTINE 21 MG/24HR TD PT24
21.0000 mg | MEDICATED_PATCH | Freq: Every day | TRANSDERMAL | Status: DC
  Administered 2011-10-20: 21 mg via TRANSDERMAL
  Filled 2011-10-20: qty 1

## 2011-10-20 NOTE — ED Notes (Signed)
Respiratory at bedside.

## 2011-10-20 NOTE — ED Provider Notes (Addendum)
History     CSN: [id number]  Arrival date & time 10/20/11  6295   First MD Initiated Contact with Patient 10/20/11 0403      Chief Complaint  Patient presents with  . Shortness of Breath    (Consider location/radiation/quality/duration/timing/severity/associated sxs/prior treatment) HPI Comments: Patient is an asthmatic was seen here 4 days ago.  He used his supplied inhaler and has no more he, said it's been bothering him.  All day.  He was around people who were smoking, which set off his asthma.  He also reports being suicidal without an active plan  Patient is a 41 y.o. male presenting with shortness of breath. The history is provided by the patient.  Shortness of Breath  The current episode started today. The problem occurs frequently. The problem has been gradually worsening. The problem is severe. Nothing relieves the symptoms. Associated symptoms include shortness of breath and wheezing. Pertinent negatives include no fever, no rhinorrhea and no cough.    Past Medical History  Diagnosis Date  . Asthma   . HIV (human immunodeficiency virus infection)   . Mental disorder   . Anxiety   . Depression     History reviewed. No pertinent past surgical history.  No family history on file.  History  Substance Use Topics  . Smoking status: Current Everyday Smoker -- 0.5 packs/day    Types: Cigarettes  . Smokeless tobacco: Never Used  . Alcohol Use: No     Pt denies      Review of Systems  Constitutional: Negative for fever and chills.  HENT: Negative for rhinorrhea.   Respiratory: Positive for shortness of breath and wheezing. Negative for cough.   Genitourinary: Negative for dysuria.  Neurological: Negative for dizziness and headaches.  Psychiatric/Behavioral: Positive for suicidal ideas.    Allergies  Shellfish allergy  Home Medications   Current Outpatient Rx  Name Route Sig Dispense Refill  . ALBUTEROL SULFATE HFA 108 (90 BASE) MCG/ACT IN AERS Inhalation  Inhale 2 puffs into the lungs every 6 (six) hours as needed. For wheezing    . PREDNISONE 20 MG PO TABS Oral Take 2 tablets (40 mg total) by mouth daily. 12 tablet 0    BP 120/79  Pulse 119  Temp 98.9 F (37.2 C) (Oral)  Resp 20  SpO2 97%  Physical Exam  Constitutional: He is oriented to person, place, and time. He appears well-developed and well-nourished.  HENT:  Head: Normocephalic.  Neck: Normal range of motion.  Cardiovascular: Normal rate.   Pulmonary/Chest: Effort normal. He has wheezes. He has no rales.  Abdominal: He exhibits distension.  Musculoskeletal: Normal range of motion.  Neurological: He is alert and oriented to person, place, and time.  Skin: Skin is warm. No rash noted.    ED Course  Procedures (including critical care time)  Labs Reviewed  CBC WITH DIFFERENTIAL - Abnormal; Notable for the following:    Neutrophils Relative 25 (*)     Neutro Abs 1.3 (*)     Lymphocytes Relative 63 (*)     All other components within normal limits  ETHANOL  URINE RAPID DRUG SCREEN (HOSP PERFORMED)   No results found.   No diagnosis found.    MDM   I spoke with Berna Spare from [facility]  who will put him on the list for evaluation  After 1 hour long name.  Treatment.  Patient is feeling, better.  He is able to sit up speak in full sentences and requests food.  I will have patient moved to pod C. for further evaluation by psychiatric social worker      Arman Filter, NP 10/20/11 0451  Arman Filter, NP 10/20/11 2956  Arman Filter, NP 10/20/11 [phone]

## 2011-10-20 NOTE — ED Notes (Signed)
Took patient to the  Bathroom patient was unable to get an urine sample will try later

## 2011-10-20 NOTE — ED Provider Notes (Signed)
Medical screening examination/treatment/procedure(s) were performed by non-physician practitioner and as supervising physician I was immediately available for consultation/collaboration.  Olivia Mackie, MD 10/20/11 [phone]

## 2011-10-20 NOTE — ED Notes (Signed)
Per EMS, pt was having problems with asthma all day today and tonight was around people who were smoking in which his asthma became worse. EMS reports wheezing in all lung fields prior to 5mg  albuterol nebulizer treatment, and lung sound clear afterward.

## 2011-10-20 NOTE — ED Notes (Signed)
Pt. wanded by security. Pt. Belongings inventoried and secured in locker #4

## 2011-10-20 NOTE — ED Notes (Signed)
Pt. Reports smoking a blunt, taking 3 shots of liqcour and drinking a 16 oz beer tonight.

## 2011-10-20 NOTE — ED Notes (Signed)
Pt. Reports suicidal and homicidal ideation without plan currently. Reports attempting suicide by "cutting my wrists and taking a bunch of pills" within the past 30 days. Suicidal precautions initiated. Hx of same.

## 2011-10-20 NOTE — Progress Notes (Signed)
Peak Flow Attempts x 3 prior to CAT.  Expected value for patient's age/height is 620.  250 260 280

## 2011-10-20 NOTE — ED Provider Notes (Addendum)
7:26 AM I evaluated the patient.  He reports vague SI.  He has no specific plan.  Patient is homeless and has no source of income.  I suspect is in the emergency department for food and shelter more than true mental health instability.  I don't believe the patient be a threat to himself or to others.  I will have the psychiatrist to evaluate the patient the bedside for official recommendations.   Lyanne Co, MD 10/20/11 [phone]  12:11 PM The patient was seen and evaluated by the psychiatrist who does not believe the patient is a threat to himself or to others.  The patient be discharged home.  Outpatient resources given to him for alcohol abuse.  He's never wants requested help with his alcohol abuse and therefore do not believe that he needs inpatient treatment for his alcohol abuse problem as he is not seeking help with his alcohol at this time  Lyanne Co, MD 10/20/11 1212

## 2011-10-20 NOTE — ED Provider Notes (Signed)
Medical screening examination/treatment/procedure(s) were performed by non-physician practitioner and as supervising physician I was immediately available for consultation/collaboration.  Sunnie Nielsen, MD 10/20/11 [phone]

## 2011-10-20 NOTE — ED Notes (Signed)
Patient states his last drink of alcohol was approx. 8pm last night.
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
## 2011-10-23 ENCOUNTER — Encounter (HOSPITAL_COMMUNITY): Payer: Self-pay

## 2011-10-28 ENCOUNTER — Emergency Department (HOSPITAL_COMMUNITY)
Admission: EM | Admit: 2011-10-28 | Discharge: 2011-10-28 | Disposition: A | Discharge status: Home / Self Care | Payer: Self-pay | Attending: Emergency Medicine | Admitting: Emergency Medicine

## 2011-10-28 ENCOUNTER — Emergency Department (HOSPITAL_COMMUNITY): Payer: Self-pay

## 2011-10-28 ENCOUNTER — Encounter (HOSPITAL_COMMUNITY): Payer: Self-pay | Admitting: *Deleted

## 2011-10-28 DIAGNOSIS — F411 Generalized anxiety disorder: Secondary | ICD-10-CM | POA: Insufficient documentation

## 2011-10-28 DIAGNOSIS — Z9119 Patient's noncompliance with other medical treatment and regimen: Secondary | ICD-10-CM | POA: Insufficient documentation

## 2011-10-28 DIAGNOSIS — Z91199 Patient's noncompliance with other medical treatment and regimen due to unspecified reason: Secondary | ICD-10-CM | POA: Insufficient documentation

## 2011-10-28 DIAGNOSIS — R0602 Shortness of breath: Secondary | ICD-10-CM | POA: Insufficient documentation

## 2011-10-28 DIAGNOSIS — F329 Major depressive disorder, single episode, unspecified: Secondary | ICD-10-CM | POA: Insufficient documentation

## 2011-10-28 DIAGNOSIS — Z21 Asymptomatic human immunodeficiency virus [HIV] infection status: Secondary | ICD-10-CM | POA: Insufficient documentation

## 2011-10-28 DIAGNOSIS — F3289 Other specified depressive episodes: Secondary | ICD-10-CM | POA: Insufficient documentation

## 2011-10-28 DIAGNOSIS — F10929 Alcohol use, unspecified with intoxication, unspecified: Secondary | ICD-10-CM

## 2011-10-28 DIAGNOSIS — J45909 Unspecified asthma, uncomplicated: Secondary | ICD-10-CM | POA: Insufficient documentation

## 2011-10-28 DIAGNOSIS — Z79899 Other long term (current) drug therapy: Secondary | ICD-10-CM | POA: Insufficient documentation

## 2011-10-28 DIAGNOSIS — R45851 Suicidal ideations: Secondary | ICD-10-CM | POA: Insufficient documentation

## 2011-10-28 LAB — RAPID URINE DRUG SCREEN, HOSP PERFORMED
Amphetamines: NOT DETECTED
Barbiturates: NOT DETECTED
Benzodiazepines: NOT DETECTED
Tetrahydrocannabinol: POSITIVE — AB

## 2011-10-28 LAB — URINALYSIS, ROUTINE W REFLEX MICROSCOPIC
Bilirubin Urine: NEGATIVE
Glucose, UA: NEGATIVE mg/dL
Hgb urine dipstick: NEGATIVE
Ketones, ur: NEGATIVE mg/dL
Leukocytes, UA: NEGATIVE
Protein, ur: NEGATIVE mg/dL
pH: 5.5 (ref 5.0–8.0)

## 2011-10-28 LAB — BASIC METABOLIC PANEL
Chloride: 104 mEq/L (ref 96–112)
Creatinine, Ser: 1.06 mg/dL (ref 0.50–1.35)
GFR calc Af Amer: >90 mL/min (ref >90)
Sodium: 143 mEq/L (ref 135–145)

## 2011-10-28 LAB — ETHANOL: Alcohol, Ethyl (B): 202 mg/dL — ABNORMAL HIGH (ref 0–11)

## 2011-10-28 LAB — CBC
MCV: 96.4 fL (ref 78.0–100.0)
Platelets: 325 10*3/uL (ref 150–400)
RDW: 12.8 % (ref 11.5–15.5)
WBC: 4.9 10*3/uL (ref 4.0–10.5)

## 2011-10-28 MED ORDER — THIAMINE HCL 100 MG/ML IJ SOLN: 100.0000 mg | Freq: Every day | INTRAMUSCULAR | Status: DC

## 2011-10-28 MED ORDER — ACETAMINOPHEN 325 MG PO TABS: 650.0000 mg | ORAL_TABLET | ORAL | Status: DC | PRN

## 2011-10-28 MED ORDER — NICOTINE 21 MG/24HR TD PT24
21.0000 mg | MEDICATED_PATCH | Freq: Every day | TRANSDERMAL | Status: DC
  Filled 2011-10-28: qty 1

## 2011-10-28 MED ORDER — ADULT MULTIVITAMIN W/MINERALS CH
1.0000 | ORAL_TABLET | Freq: Every day | ORAL | Status: DC
  Administered 2011-10-28: 1 via ORAL
  Filled 2011-10-28: qty 1

## 2011-10-28 MED ORDER — FOLIC ACID 1 MG PO TABS
1.0000 mg | ORAL_TABLET | Freq: Every day | ORAL | Status: DC
  Administered 2011-10-28: 1 mg via ORAL
  Filled 2011-10-28: qty 1

## 2011-10-28 MED ORDER — ZOLPIDEM TARTRATE 5 MG PO TABS: 5.0000 mg | ORAL_TABLET | Freq: Every evening | ORAL | Status: DC | PRN

## 2011-10-28 MED ORDER — LORAZEPAM 1 MG PO TABS
0.0000 mg | ORAL_TABLET | Freq: Four times a day (QID) | ORAL | Status: DC
  Administered 2011-10-28: 1 mg via ORAL

## 2011-10-28 MED ORDER — LORAZEPAM 1 MG PO TABS
1.0000 mg | ORAL_TABLET | Freq: Four times a day (QID) | ORAL | Status: DC | PRN
  Filled 2011-10-28: qty 1

## 2011-10-28 MED ORDER — VITAMIN B-1 100 MG PO TABS
100.0000 mg | ORAL_TABLET | Freq: Every day | ORAL | Status: DC
  Administered 2011-10-28: 100 mg via ORAL
  Filled 2011-10-28: qty 1

## 2011-10-28 MED ORDER — ONDANSETRON HCL 4 MG PO TABS: 4.0000 mg | ORAL_TABLET | Freq: Three times a day (TID) | ORAL | Status: DC | PRN

## 2011-10-28 MED ORDER — LORAZEPAM 2 MG/ML IJ SOLN: 1.0000 mg | Freq: Four times a day (QID) | INTRAMUSCULAR | Status: DC | PRN

## 2011-10-28 MED ORDER — LORAZEPAM 1 MG PO TABS: 0.0000 mg | ORAL_TABLET | Freq: Two times a day (BID) | ORAL | Status: DC

## 2011-10-28 MED ORDER — ALBUTEROL SULFATE (5 MG/ML) 0.5% IN NEBU
INHALATION_SOLUTION | RESPIRATORY_TRACT | Status: AC
  Administered 2011-10-28: 04:00:00
  Filled 2011-10-28: qty 1

## 2011-10-28 MED ORDER — IBUPROFEN 200 MG PO TABS: 600.0000 mg | ORAL_TABLET | Freq: Three times a day (TID) | ORAL | Status: DC | PRN

## 2011-10-28 NOTE — ED Notes (Signed)
Pt reports SI and said that he currently has no plan but if he were to do it, Pt would jump over a bridge or jump in front of a car.  Pt states that tonight he thought about killing himself and went to a bridge and threw a leg over the bridge.

## 2011-10-28 NOTE — ED Notes (Signed)
[id number]> Expected date:<BR> Expected time:<BR> Means of arrival:<BR> Comments:<BR> EMS/SOB/using rescue inhaler

## 2011-10-28 NOTE — ED Notes (Addendum)
Per EMS report, pt from home: Started having trouble breathing a few days ago and using rescue inhaler with no relief.  Tonight EMS found pt walking which made his asthma worse.  Lungs sound diminished bilaterally and slight wheeze in upper right lung. Pt states he has SI and very emotional. BP: 122/82, HR 105 regular, RR:20 regular. Retractions and accessory muscle usage. 5mg  of Albuterol given. Pt admits to having ETOH tonight.

## 2011-10-28 NOTE — ED Notes (Signed)
Papers faxed to telepsych, confirmed with phone call, equipment moved into room, [place] in with pt @ present, Donell Beers, RN aware.

## 2011-10-28 NOTE — ED Provider Notes (Signed)
Consultation appreciated. The patient is not suicidal and does not wish to help with detox. I confirmed with the patient that he is not suicidal and does not wish detox. Accordingly he will be discharged.  Dione Booze, MD 10/28/11 1650

## 2011-10-28 NOTE — ED Notes (Signed)
Given bus pass. 

## 2011-10-28 NOTE — ED Provider Notes (Addendum)
History     CSN: [id number]  Arrival date & time 10/28/11  0335   First MD Initiated Contact with Patient 10/28/11 [phone]      Chief Complaint  Patient presents with  . Shortness of Breath  . Suicidal     The history is provided by the patient.   the patient is homeless and has a history of alcohol abuse.  He presents the emergency department intoxicated tonight complaining of suicidal ideation.  He has no specific plan.  He reports "I'm just high living and want to end things".  He is in the emergency department several days ago for the same and was evaluated by psychiatry at that time who deemed the patient to be safe for discharge.  He is not on any medications.  He has not seek any help for his alcohol abuse.  It sounds that the patient had some shortness of breath and EMS was called and the patient was given an albuterol nebulized treatment with improvement in his symptoms.  He denies fevers or chills.  His had no cough or congestion.  He reports no compliance with albuterol.  He reports his breathing is returned to baseline at this time.  Past Medical History  Diagnosis Date  . Asthma   . HIV (human immunodeficiency virus infection)   . Mental disorder   . Anxiety   . Depression     History reviewed. No pertinent past surgical history.  No family history on file.  History  Substance Use Topics  . Smoking status: Current Everyday Smoker -- 0.5 packs/day    Types: Cigarettes  . Smokeless tobacco: Never Used  . Alcohol Use: 0.0 oz/week     Pt denies      Review of Systems  All other systems reviewed and are negative.    Allergies  Shellfish allergy  Home Medications   Current Outpatient Rx  Name Route Sig Dispense Refill  . ALBUTEROL SULFATE HFA 108 (90 BASE) MCG/ACT IN AERS Inhalation Inhale 2 puffs into the lungs every 6 (six) hours as needed. For wheezing      BP 104/51  Pulse 98  Temp 97.8 F (36.6 C) (Oral)  Resp 18  SpO2 95%  Physical Exam    Nursing note and vitals reviewed. Constitutional: He is oriented to person, place, and time. He appears well-developed and well-nourished.  HENT:  Head: Normocephalic and atraumatic.  Eyes: EOM are normal.  Neck: Normal range of motion.  Cardiovascular: Normal rate, regular rhythm, normal heart sounds and intact distal pulses.   Pulmonary/Chest: Effort normal and breath sounds normal. No respiratory distress.  Abdominal: Soft. He exhibits no distension. There is no tenderness.  Musculoskeletal: Normal range of motion.  Neurological: He is alert and oriented to person, place, and time.  Skin: Skin is warm and dry.  Psychiatric:       Suicidal ideation    ED Course  Procedures (including critical care time)  Labs Reviewed  URINE RAPID DRUG SCREEN (HOSP PERFORMED) - Abnormal; Notable for the following:    Tetrahydrocannabinol POSITIVE (*)     All other components within normal limits  CBC - Abnormal; Notable for the following:    RBC 4.13 (*)     All other components within normal limits  BASIC METABOLIC PANEL - Abnormal; Notable for the following:    GFR calc non Af Amer 86 (*)     All other components within normal limits  ETHANOL - Abnormal; Notable for the following:  Alcohol, Ethyl (B) 202 (*)     All other components within normal limits  URINALYSIS, ROUTINE W REFLEX MICROSCOPIC   No results found.   No diagnosis found.    MDM  The patient is shortness of breath is resolved by the time he arrived in the emergency department.  He has no wheezing on exam.  I suspect this is asthma and medication noncompliance.  He reports he is suicidal at this time.  He has no specific plan.  I'm not completely convinced I will ask  psychiatry to evaluate the patient for assistance with disposition planning.        Lyanne Co, MD 10/28/11 [phone]   Date: 12/26/2011  Rate: 100  Rhythm: normal sinus rhythm  QRS Axis: normal  Intervals: normal  ST/T Wave abnormalities:  normal  Conduction Disutrbances: none  Narrative Interpretation:   Old EKG Reviewed: No significant changes noted     Lyanne Co, MD 12/26/11 2215

## 2011-10-28 NOTE — ED Notes (Signed)
Pt walked from his room down to room one and back up by room thirteen.

## 2011-12-23 ENCOUNTER — Emergency Department (HOSPITAL_COMMUNITY)
Admission: EM | Admit: 2011-12-23 | Discharge: 2011-12-24 | Disposition: A | Discharge status: Home / Self Care | Payer: Self-pay | Attending: Emergency Medicine | Admitting: Emergency Medicine

## 2011-12-23 ENCOUNTER — Encounter (HOSPITAL_COMMUNITY): Payer: Self-pay | Admitting: *Deleted

## 2011-12-23 ENCOUNTER — Emergency Department (HOSPITAL_COMMUNITY): Payer: Self-pay

## 2011-12-23 DIAGNOSIS — J3489 Other specified disorders of nose and nasal sinuses: Secondary | ICD-10-CM | POA: Insufficient documentation

## 2011-12-23 DIAGNOSIS — F411 Generalized anxiety disorder: Secondary | ICD-10-CM | POA: Insufficient documentation

## 2011-12-23 DIAGNOSIS — F3289 Other specified depressive episodes: Secondary | ICD-10-CM | POA: Insufficient documentation

## 2011-12-23 DIAGNOSIS — J45909 Unspecified asthma, uncomplicated: Secondary | ICD-10-CM

## 2011-12-23 DIAGNOSIS — R05 Cough: Secondary | ICD-10-CM | POA: Insufficient documentation

## 2011-12-23 DIAGNOSIS — R059 Cough, unspecified: Secondary | ICD-10-CM | POA: Insufficient documentation

## 2011-12-23 DIAGNOSIS — Z21 Asymptomatic human immunodeficiency virus [HIV] infection status: Secondary | ICD-10-CM | POA: Insufficient documentation

## 2011-12-23 DIAGNOSIS — F17200 Nicotine dependence, unspecified, uncomplicated: Secondary | ICD-10-CM | POA: Insufficient documentation

## 2011-12-23 DIAGNOSIS — J45901 Unspecified asthma with (acute) exacerbation: Secondary | ICD-10-CM | POA: Insufficient documentation

## 2011-12-23 DIAGNOSIS — F329 Major depressive disorder, single episode, unspecified: Secondary | ICD-10-CM | POA: Insufficient documentation

## 2011-12-23 DIAGNOSIS — R0602 Shortness of breath: Secondary | ICD-10-CM | POA: Insufficient documentation

## 2011-12-23 DIAGNOSIS — R000 Tachycardia, unspecified: Secondary | ICD-10-CM | POA: Insufficient documentation

## 2011-12-23 MED ORDER — ALBUTEROL SULFATE (5 MG/ML) 0.5% IN NEBU
5.0000 mg | INHALATION_SOLUTION | Freq: Once | RESPIRATORY_TRACT | Status: AC
  Administered 2011-12-23: 5 mg via RESPIRATORY_TRACT
  Filled 2011-12-23: qty 1

## 2011-12-23 MED ORDER — IPRATROPIUM BROMIDE 0.02 % IN SOLN
0.5000 mg | Freq: Once | RESPIRATORY_TRACT | Status: AC
  Administered 2011-12-23: 0.5 mg via RESPIRATORY_TRACT
  Filled 2011-12-23: qty 2.5

## 2011-12-23 NOTE — ED Provider Notes (Signed)
History     CSN: [id number]  Arrival date & time 12/23/11  2010   First MD Initiated Contact with Patient 12/23/11 2208      Chief Complaint  Patient presents with  . Shortness of Breath  . Anxiety    (Consider location/radiation/quality/duration/timing/severity/associated sxs/prior treatment) HPI History provided by pt.   Pt complains of his typical asthma sx.  Has had shortness of breath that is aggravated by walking since yesterday.  Associated w/ cough and wheezing as well as rhinorrhea.  Denies fever, sore throat, chest pain, abd pain, N/V/D.  No relief w/ albuterol inhaler.  Is not on any maintenance medications.  Reports that he has episodes of SOB several times a day and this has been happening for the past three months.  Sx often improve w/ albuterol.  Past Medical History  Diagnosis Date  . Asthma   . HIV (human immunodeficiency virus infection)   . Mental disorder   . Anxiety   . Depression     History reviewed. No pertinent past surgical history.  No family history on file.  History  Substance Use Topics  . Smoking status: Current Every Day Smoker -- 0.5 packs/day    Types: Cigarettes  . Smokeless tobacco: Never Used  . Alcohol Use: 0.0 oz/week     Pt denies      Review of Systems  All other systems reviewed and are negative.    Allergies  Shellfish allergy  Home Medications   Current Outpatient Rx  Name Route Sig Dispense Refill  . ALBUTEROL SULFATE HFA 108 (90 BASE) MCG/ACT IN AERS Inhalation Inhale 2 puffs into the lungs every 6 (six) hours as needed. For wheezing      BP 113/67  Pulse 108  Temp 98.5 F (36.9 C) (Oral)  Resp 26  SpO2 98%  Physical Exam  Nursing note and vitals reviewed. Constitutional: He is oriented to person, place, and time. He appears well-developed and well-nourished. No distress.  HENT:  Head: Normocephalic and atraumatic.  Eyes:       Normal appearance  Neck: Normal range of motion.  Cardiovascular:  Regular rhythm.        Mildly tachycardic  Pulmonary/Chest: Effort normal and breath sounds normal. No respiratory distress.       Coughing.  Mild, diffuse expiratory wheezing.  Prolonged expiratory phase.    Musculoskeletal: Normal range of motion.  Neurological: He is alert and oriented to person, place, and time.  Skin: Skin is warm and dry. No rash noted.  Psychiatric: He has a normal mood and affect. His behavior is normal.    ED Course  Procedures (including critical care time)  Date: 12/24/2011  Rate: 109  Rhythm: sinus tachycardia  QRS Axis: normal  Intervals: normal  ST/T Wave abnormalities: normal  Conduction Disutrbances:none  Narrative Interpretation:   Old EKG Reviewed: unchanged   Labs Reviewed - No data to display Dg Chest 2 View  12/23/2011  *RADIOLOGY REPORT*  Clinical Data: 41 year old male chest tightness.  HIV, shortness of breath, asthma.  CHEST - 2 VIEW  Comparison: 10/28/2011 and earlier.  Findings: Stable lung volumes.  Cardiac size and mediastinal contours are within normal limits.  Visualized tracheal air column is within normal limits.  No pneumothorax or pleural effusion.  No consolidation.  Widespread increased interstitial markings not significantly changed.  Incidental bilateral nipple shadows. No acute osseous abnormality identified.  IMPRESSION: No acute cardiopulmonary abnormality. Incidental nipple shadows.   Original Report Authenticated By: Ulla Potash III,  M.D.      1. Asthma       MDM  41yo M w/ h/o asthma presents w/ c/o exertional SOB, wheezing and cough.  Has had intermittent and frequent symptoms over the past month that often improve w/ albuterol, but no relief since yesterday.  Is not on maintenance medications.  Sx are typical.  Pt afebrile, no respiratory distress, diffuse, mild expiratory wheezing and coughing on exam.  CXR neg and EKG non-ischemic and unchanged from prior.  Pt received a breathing treatment w/ improvement in sx and  exam.  D/c'd home w/ a new rescue inhaler, 5 day course of prednisone and referral to [facility].  I explained to him that w/ frequency of sx, he likely needs to be started on an inhaled steroid.  Return precautions discussed.         Otilio Miu, PA 12/24/11 0630

## 2011-12-23 NOTE — ED Notes (Signed)
The pt has had sob for 3 days and he says his inhaler is not working for him.  No wheezing at present.  Smells of alcohol.  No respiratory distress

## 2011-12-23 NOTE — ED Notes (Signed)
Pt to xray. Will finish breathing tx when returns.

## 2011-12-23 NOTE — ED Notes (Signed)
Patient brought to ED by [facility] for shortness of breath and anxiety.  Patient has been taking his inhaler wlo relief.  Patient ambulatory and respiratory intact upon arrival.

## 2011-12-24 MED ORDER — PREDNISONE 10 MG PO TABS: 10.0000 mg | ORAL_TABLET | Freq: Two times a day (BID) | ORAL | Status: DC

## 2011-12-24 MED ORDER — ALBUTEROL SULFATE HFA 108 (90 BASE) MCG/ACT IN AERS
2.0000 | INHALATION_SPRAY | Freq: Once | RESPIRATORY_TRACT | Status: AC
  Administered 2011-12-24: 2 via RESPIRATORY_TRACT
  Filled 2011-12-24 (×2): qty 6.7

## 2011-12-27 NOTE — ED Provider Notes (Signed)
Medical screening examination/treatment/procedure(s) were performed by non-physician practitioner and as supervising physician I was immediately available for consultation/collaboration.   Joya Gaskins, MD 12/27/11 1944

## 2011-12-31 ENCOUNTER — Ambulatory Visit (INDEPENDENT_AMBULATORY_CARE_PROVIDER_SITE_OTHER): Payer: Federal, State, Local not specified - Other | Admitting: *Deleted

## 2011-12-31 VITALS — BP 110/68 | HR 69 | Temp 98.3°F | Resp 18 | Wt 183.5 lb

## 2011-12-31 DIAGNOSIS — B20 Human immunodeficiency virus [HIV] disease: Secondary | ICD-10-CM

## 2011-12-31 LAB — CD4/CD8 (T-HELPER/T-SUPPRESSOR CELL)
CD4: 520
CD8 % Suppressor T Cell: 54.1
CD8: 974

## 2011-12-31 NOTE — Progress Notes (Signed)
Patient states he feels better now, not depressed or suicidal, has not been hearing voices since July. He has cut back on alcohol intake and feels better, no GI sx and has put on weight. He is trying to stay away from friends that drink. He lives with John [name] who is providing support. He has had numerous ED visits since the last one for psych issues in June for Asthma exacerbation and has been using an albuterol inhaler frequently. He does not have access to medications. I told him he needed to see one of our physicians for followup of his HIV and possibly get a prescription for asthma medication and that our medication assistance counselor could help him get the meds. He also needs his John [name] renewed and plans to see John [name] when he comes back in October 31st. He will also need to get a flu shot at that time. He remains off of [facility]VS as per protocol until his CD4 drops more.

## 2012-01-03 LAB — HIV-1 RNA QUANT-NO REFLEX-BLD
HIV 1 RNA Quant: 1355 copies/mL — ABNORMAL HIGH (ref <20)
HIV-1 RNA Quant, Log: 3.13 {Log} — ABNORMAL HIGH (ref <1.30)

## 2012-01-05 ENCOUNTER — Emergency Department (HOSPITAL_COMMUNITY)
Admission: EM | Admit: 2012-01-05 | Discharge: 2012-01-05 | Disposition: A | Discharge status: Home / Self Care | Payer: Self-pay | Attending: Emergency Medicine | Admitting: Emergency Medicine

## 2012-01-05 ENCOUNTER — Encounter (HOSPITAL_COMMUNITY): Payer: Self-pay

## 2012-01-05 DIAGNOSIS — B20 Human immunodeficiency virus [HIV] disease: Secondary | ICD-10-CM | POA: Insufficient documentation

## 2012-01-05 DIAGNOSIS — J45909 Unspecified asthma, uncomplicated: Secondary | ICD-10-CM | POA: Insufficient documentation

## 2012-01-05 DIAGNOSIS — Z79899 Other long term (current) drug therapy: Secondary | ICD-10-CM | POA: Insufficient documentation

## 2012-01-05 DIAGNOSIS — F341 Dysthymic disorder: Secondary | ICD-10-CM | POA: Insufficient documentation

## 2012-01-05 DIAGNOSIS — F17200 Nicotine dependence, unspecified, uncomplicated: Secondary | ICD-10-CM | POA: Insufficient documentation

## 2012-01-05 DIAGNOSIS — Z8659 Personal history of other mental and behavioral disorders: Secondary | ICD-10-CM | POA: Insufficient documentation

## 2012-01-05 MED ORDER — FLUTICASONE-SALMETEROL 250-50 MCG/DOSE IN AEPB: 1.0000 | INHALATION_SPRAY | Freq: Two times a day (BID) | RESPIRATORY_TRACT | Status: DC

## 2012-01-05 MED ORDER — ALBUTEROL SULFATE (5 MG/ML) 0.5% IN NEBU
5.0000 mg | INHALATION_SOLUTION | Freq: Once | RESPIRATORY_TRACT | Status: AC
  Administered 2012-01-05: 5 mg via RESPIRATORY_TRACT

## 2012-01-05 MED ORDER — ALBUTEROL SULFATE HFA 108 (90 BASE) MCG/ACT IN AERS: 1.0000 | INHALATION_SPRAY | Freq: Four times a day (QID) | RESPIRATORY_TRACT | Status: DC | PRN

## 2012-01-05 MED ORDER — IPRATROPIUM BROMIDE 0.02 % IN SOLN
0.5000 mg | Freq: Once | RESPIRATORY_TRACT | Status: AC
  Administered 2012-01-05: 0.5 mg via RESPIRATORY_TRACT

## 2012-01-05 MED ORDER — ALBUTEROL SULFATE HFA 108 (90 BASE) MCG/ACT IN AERS
2.0000 | INHALATION_SPRAY | RESPIRATORY_TRACT | Status: DC | PRN
  Administered 2012-01-05: 2 via RESPIRATORY_TRACT
  Filled 2012-01-05: qty 6.7

## 2012-01-05 MED ORDER — ALBUTEROL SULFATE (5 MG/ML) 0.5% IN NEBU
INHALATION_SOLUTION | RESPIRATORY_TRACT | Status: AC
  Administered 2012-01-05: 5 mg via RESPIRATORY_TRACT
  Filled 2012-01-05: qty 1

## 2012-01-05 MED ORDER — IPRATROPIUM BROMIDE 0.02 % IN SOLN
RESPIRATORY_TRACT | Status: AC
  Filled 2012-01-05: qty 2.5

## 2012-01-05 MED ORDER — FLUTICASONE-SALMETEROL 250-50 MCG/DOSE IN AEPB
1.0000 | INHALATION_SPRAY | Freq: Two times a day (BID) | RESPIRATORY_TRACT | Status: DC
  Filled 2012-01-05: qty 14

## 2012-01-05 NOTE — ED Notes (Signed)
Pt presents with onset of asthma exacerbation x 2-3 days.  +productive cough with grey phlegm, inspiratory and expiratory wheezes noted on arrival

## 2012-01-05 NOTE — ED Provider Notes (Signed)
History     CSN: [id number]  Arrival date & time 01/05/12  1451   First MD Initiated Contact with Patient 01/05/12 1538      Chief Complaint  Patient presents with  . Shortness of Breath    (Consider location/radiation/quality/duration/timing/severity/associated sxs/prior treatment) Patient is a 41 y.o. male presenting with shortness of breath. The history is provided by the patient.  Shortness of Breath  The current episode started yesterday. Associated symptoms include cough, shortness of breath and wheezing. Pertinent negatives include no chest pain.   patient with shortness of breath. History of asthma. He's been out of his inhaler for last couple months per patient. He states he's had a little bit of productive sputum with her cough. No chest pain. He states he now feels great after breathing treatment. No fevers. He is HIV positive but has an apparent CD4 of 600. No swelling of his legs. he's not on any controller medicine  Past Medical History  Diagnosis Date  . Asthma   . HIV (human immunodeficiency virus infection)   . Mental disorder   . Anxiety   . Depression     History reviewed. No pertinent past surgical history.  No family history on file.  History  Substance Use Topics  . Smoking status: Current Every Day Smoker -- 0.5 packs/day    Types: Cigarettes  . Smokeless tobacco: Never Used  . Alcohol Use: 0.0 oz/week     Pt denies      Review of Systems  Constitutional: Negative for activity change and appetite change.  HENT: Negative for neck stiffness.   Eyes: Negative for pain.  Respiratory: Positive for cough, shortness of breath and wheezing. Negative for chest tightness.   Cardiovascular: Negative for chest pain and leg swelling.  Gastrointestinal: Negative for nausea, vomiting, abdominal pain and diarrhea.  Genitourinary: Negative for flank pain.  Musculoskeletal: Negative for back pain.  Skin: Negative for rash.  Neurological: Negative for  weakness, numbness and headaches.  Psychiatric/Behavioral: Negative for behavioral problems.    Allergies  Shellfish allergy  Home Medications   Current Outpatient Rx  Name Route Sig Dispense Refill  . ALBUTEROL SULFATE HFA 108 (90 BASE) MCG/ACT IN AERS Inhalation Inhale 2 puffs into the lungs every 6 (six) hours as needed. For wheezing    . ALBUTEROL SULFATE HFA 108 (90 BASE) MCG/ACT IN AERS Inhalation Inhale 1-2 puffs into the lungs every 6 (six) hours as needed for wheezing. 1 Inhaler 0  . FLUTICASONE-SALMETEROL 250-50 MCG/DOSE IN AEPB Inhalation Inhale 1 puff into the lungs 2 (two) times daily. 60 each 0    BP 113/80  Pulse 75  Temp 98.3 F (36.8 C) (Oral)  Resp 23  Ht 5\' 5"  (1.651 m)  Wt 180 lb (81.647 kg)  BMI 29.95 kg/m2  SpO2 96%  Physical Exam  Nursing note and vitals reviewed. Constitutional: He is oriented to person, place, and time. He appears well-developed and well-nourished.  HENT:  Head: Normocephalic and atraumatic.  Eyes: EOM are normal. Pupils are equal, round, and reactive to light.  Neck: Normal range of motion. Neck supple.  Cardiovascular: Normal rate, regular rhythm and normal heart sounds.   No murmur heard. Pulmonary/Chest: Effort normal. He has no wheezes.       Mildly prolonged expirations.  Abdominal: Soft. Bowel sounds are normal. He exhibits no distension and no mass. There is no tenderness. There is no rebound and no guarding.  Musculoskeletal: Normal range of motion. He exhibits no edema.  Neurological:  He is alert and oriented to person, place, and time. No cranial nerve deficit.  Skin: Skin is warm and dry.  Psychiatric: He has a normal mood and affect.    ED Course  Procedures (including critical care time)  Labs Reviewed - No data to display No results found.   1. Asthma       MDM  Patient with asthma exacerbation. Feels better after breathing treatment. His been off his albuterol. He does not have funds to get more  medications. He was given albuterol and Advair here. Advair was added because he is poorly controlled otherwise. He'll be discharged home. He does not appear to need steroids at this time.        Juliet Rude. Rubin Payor, MD 01/06/12 2130

## 2012-01-13 ENCOUNTER — Ambulatory Visit: Payer: Self-pay | Admitting: Internal Medicine

## 2012-01-13 ENCOUNTER — Ambulatory Visit: Payer: Self-pay

## 2012-01-14 ENCOUNTER — Encounter: Payer: Self-pay | Admitting: Internal Medicine

## 2012-01-15 ENCOUNTER — Emergency Department (HOSPITAL_COMMUNITY)
Admission: EM | Admit: 2012-01-15 | Discharge: 2012-01-16 | Disposition: A | Discharge status: Home / Self Care | Payer: Self-pay | Attending: Emergency Medicine | Admitting: Emergency Medicine

## 2012-01-15 DIAGNOSIS — F17200 Nicotine dependence, unspecified, uncomplicated: Secondary | ICD-10-CM | POA: Insufficient documentation

## 2012-01-15 DIAGNOSIS — F411 Generalized anxiety disorder: Secondary | ICD-10-CM | POA: Insufficient documentation

## 2012-01-15 DIAGNOSIS — F39 Unspecified mood [affective] disorder: Secondary | ICD-10-CM | POA: Insufficient documentation

## 2012-01-15 DIAGNOSIS — F3289 Other specified depressive episodes: Secondary | ICD-10-CM | POA: Insufficient documentation

## 2012-01-15 DIAGNOSIS — B20 Human immunodeficiency virus [HIV] disease: Secondary | ICD-10-CM | POA: Insufficient documentation

## 2012-01-15 DIAGNOSIS — F329 Major depressive disorder, single episode, unspecified: Secondary | ICD-10-CM | POA: Insufficient documentation

## 2012-01-15 DIAGNOSIS — J45909 Unspecified asthma, uncomplicated: Secondary | ICD-10-CM | POA: Insufficient documentation

## 2012-01-16 ENCOUNTER — Emergency Department (HOSPITAL_COMMUNITY): Payer: Self-pay

## 2012-01-16 LAB — COMPREHENSIVE METABOLIC PANEL
CO2: 20 mEq/L (ref 19–32)
Calcium: 8.9 mg/dL (ref 8.4–10.5)
Creatinine, Ser: 0.96 mg/dL (ref 0.50–1.35)
GFR calc Af Amer: >90 mL/min (ref >90)
GFR calc non Af Amer: >90 mL/min (ref >90)
Glucose, Bld: 132 mg/dL — ABNORMAL HIGH (ref 70–99)
Total Protein: 7.5 g/dL (ref 6.0–8.3)

## 2012-01-16 LAB — CBC WITH DIFFERENTIAL/PLATELET
Eosinophils Absolute: 0.1 10*3/uL (ref 0.0–0.7)
Eosinophils Relative: 2 % (ref 0–5)
HCT: 42.6 % (ref 39.0–52.0)
Lymphocytes Relative: 57 % — ABNORMAL HIGH (ref 12–46)
Lymphs Abs: 3.8 10*3/uL (ref 0.7–4.0)
MCH: 31.6 pg (ref 26.0–34.0)
MCV: 89.7 fL (ref 78.0–100.0)
Monocytes Absolute: 0.4 10*3/uL (ref 0.1–1.0)
RBC: 4.75 MIL/uL (ref 4.22–5.81)
RDW: 12.7 % (ref 11.5–15.5)
WBC: 6.7 10*3/uL (ref 4.0–10.5)

## 2012-01-16 LAB — RAPID URINE DRUG SCREEN, HOSP PERFORMED
Amphetamines: NOT DETECTED
Benzodiazepines: NOT DETECTED
Opiates: NOT DETECTED
Tetrahydrocannabinol: POSITIVE — AB

## 2012-01-16 LAB — SALICYLATE LEVEL: Salicylate Lvl: <2 mg/dL — ABNORMAL LOW (ref 2.8–20.0)

## 2012-01-16 MED ORDER — ALBUTEROL SULFATE (5 MG/ML) 0.5% IN NEBU
2.5000 mg | INHALATION_SOLUTION | Freq: Once | RESPIRATORY_TRACT | Status: AC
  Administered 2012-01-16: 2.5 mg via RESPIRATORY_TRACT
  Filled 2012-01-16: qty 0.5

## 2012-01-16 MED ORDER — LORAZEPAM 2 MG/ML IJ SOLN
2.0000 mg | Freq: Once | INTRAMUSCULAR | Status: AC
  Administered 2012-01-16: 2 mg via INTRAMUSCULAR

## 2012-01-16 MED ORDER — IBUPROFEN 600 MG PO TABS: 600.0000 mg | ORAL_TABLET | Freq: Three times a day (TID) | ORAL | Status: DC | PRN

## 2012-01-16 MED ORDER — HALOPERIDOL LACTATE 5 MG/ML IJ SOLN
10.0000 mg | Freq: Once | INTRAMUSCULAR | Status: AC
  Administered 2012-01-16: 10 mg via INTRAMUSCULAR

## 2012-01-16 MED ORDER — ALBUTEROL SULFATE HFA 108 (90 BASE) MCG/ACT IN AERS
2.0000 | INHALATION_SPRAY | RESPIRATORY_TRACT | Status: DC | PRN
  Administered 2012-01-16: 2 via RESPIRATORY_TRACT
  Filled 2012-01-16: qty 6.7

## 2012-01-16 MED ORDER — PREDNISONE 50 MG PO TABS
60.0000 mg | ORAL_TABLET | Freq: Once | ORAL | Status: AC
  Administered 2012-01-16: 60 mg via ORAL
  Filled 2012-01-16 (×2): qty 1

## 2012-01-16 MED ORDER — PREDNISONE 20 MG PO TABS
40.0000 mg | ORAL_TABLET | Freq: Every day | ORAL | Status: DC
  Filled 2012-01-16: qty 2

## 2012-01-16 NOTE — ED Notes (Signed)
Respiratory treatment completed without incident.

## 2012-01-16 NOTE — ED Provider Notes (Addendum)
History and physical and review of systems was completed is a downtime form by Dr. Fonnie Jarvis.    Telemedicine psychiatric consult is pending. Patient is still too sedated to undergo the consultation.   Shelda Jakes, MD 01/16/12 5409  Shelda Jakes, MD 01/16/12 [phone]

## 2012-01-16 NOTE — Progress Notes (Signed)
Attempted to Ax Pt.  Pt sleeping and unable to remain awake.  CSW to continue to follow.  Providence Crosby, LCSWA Clinical Social Work [phone]

## 2012-01-16 NOTE — ED Notes (Signed)
telepsych completed 

## 2012-01-16 NOTE — ED Notes (Signed)
Sleeping, respirations even and unlabored

## 2012-01-16 NOTE — ED Notes (Signed)
EDP still wants nebulizer treatment done.

## 2012-01-16 NOTE — ED Notes (Signed)
All paper down form charting papers are gone from the chart including the copies writer made to use for telepsych recommendation. Writer cannot get into access anywhere program.

## 2012-01-16 NOTE — ED Notes (Signed)
Pt has been up and to the bathroom. He was very unsteady on his feet but didn't fall. He's still very sleepy. There was a wet spot in his bed so his linen was changed. Pt breathing very hard and labored and coughing. Inhaler given. Will see if nebulizer treatment can be done now even though pt is still very sleepy. Pt is back to sleep already. RN said pt has to participate in nebulizer treatment and he cannot right now.

## 2012-01-16 NOTE — ED Notes (Signed)
Pt in xray

## 2012-01-16 NOTE — BH Assessment (Signed)
Assessment Note   John [name] is a 41 y.o. male who presents via IVC to [facility] with SI, w/plan to jump off bridge and alcohol abuse.  Pt tells this Clinical research associate today that he continues with SI but has no plan to harm self.  Upon arrival to ED, pt was intoxicated, agitated and aggressive. Pt had been banging his head on the wall and TV (per IVC papers and [employer]).  Pt.'s alcohol level upon arrival was 241, pt is lucid, non-aggressive(today), cooperative with this counselor during assessment.  Pt endorses AH, with command, voices commanding pt to "do it".  Pt says he's been off his meds for 2 mos , stating he's unable to afford them and "I don't why I why I'm not taking my meds". Pt says no trigger(s) for recent episode, but has been SI and AH x24 hrs. Pt admits to SA, drinking 1/5 daily and smoking 3 blunts daily, last use was 01/15/12, pt is not req detox.  Pt has legal charges--drunk & disruptive, court date on 01/25/2012.   Axis I: Alcohol Abuse and Mood Disorder NOS Axis II: Deferred Axis III:  Past Medical History  Diagnosis Date  . Asthma   . HIV (human immunodeficiency virus infection)   . Mental disorder   . Anxiety   . Depression    Axis IV: other psychosocial or environmental problems, problems related to legal system/crime, problems related to social environment and problems with primary support group Axis V: 31-40 impairment in reality testing  Past Medical History:  Past Medical History  Diagnosis Date  . Asthma   . HIV (human immunodeficiency virus infection)   . Mental disorder   . Anxiety   . Depression     No past surgical history on file.  Family History: No family history on file.  Social History:  reports that he has been smoking Cigarettes.  He has been smoking about .5 packs per day. He has never used smokeless tobacco. He reports that he drinks alcohol. He reports that he uses illicit drugs (Marijuana).  Additional Social History:  Alcohol / Drug Use Pain  Medications: None  Prescriptions: None  Over the Counter: None  History of alcohol / drug use?: Yes Longest period of sobriety (when/how long): None  Negative Consequences of Use: Personal relationships Withdrawal Symptoms: Other (Comment) (No w/d sxs at this time ) Substance #1 Name of Substance 1: Alcohol  1 - Age of First Use: 8 YOM  1 - Amount (size/oz): 1/5  1 - Frequency: Daily  1 - Duration: On-going  1 - Last Use / Amount: 01/15/12 Substance #2 Name of Substance 2: THC  2 - Age of First Use: 8 YOM  2 - Amount (size/oz): 3 Blunts  2 - Frequency: Daily  2 - Duration: On-going  2 - Last Use / Amount: 01/15/12  CIWA: CIWA-Ar BP: 113/73 mmHg Pulse Rate: 82  Nausea and Vomiting: no nausea and no vomiting Tactile Disturbances: none Tremor: no tremor Auditory Disturbances: not present Paroxysmal Sweats: no sweat visible Visual Disturbances: not present Anxiety: no anxiety, at ease Headache, Fullness in Head: none present Agitation: normal activity Orientation and Clouding of Sensorium: oriented and can do serial additions CIWA-Ar Total: 0  COWS:    Allergies:  Allergies  Allergen Reactions  . Shellfish Allergy Anaphylaxis and Swelling    Home Medications:  (Not in a hospital admission)  OB/GYN Status:  No LMP for male patient.  General Assessment Data Location of Assessment: [facility] Living Arrangements:  Alone Can pt return to current living arrangement?: Yes Admission Status: Involuntary Is patient capable of signing voluntary admission?: No Transfer from: Acute Hospital Referral Source: MD  Education Status Is patient currently in school?: No Current Grade: None  Highest grade of school patient has completed: None  Name of school: None  Contact person: None   Risk to self Suicidal Ideation: Yes-Currently Present Suicidal Intent: No-Not Currently/Within Last 6 Months Is patient at risk for suicide?: Yes Suicidal Plan?: No-Not Currently/Within Last 6  Months Access to Means: Yes Specify Access to Suicidal Means: Sharps  What has been your use of drugs/alcohol within the last 12 months?: Abusing: alcohol, THC  Previous Attempts/Gestures: No How many times?: 0  Other Self Harm Risks: None  Triggers for Past Attempts: Unpredictable Intentional Self Injurious Behavior: None Family Suicide History: No Recent stressful life event(s): Other (Comment);Legal Issues;Financial Problems (Non-compliance w/meds; Health problems ) Persecutory voices/beliefs?: No Depression: Yes Depression Symptoms: Loss of interest in usual pleasures;Feeling worthless/self pity Substance abuse history and/or treatment for substance abuse?: Yes Suicide prevention information given to non-admitted patients: Not applicable  Risk to Others Homicidal Ideation: No Thoughts of Harm to Others: No Current Homicidal Intent: No Current Homicidal Plan: No Access to Homicidal Means: No Identified Victim: None  History of harm to others?: No Assessment of Violence: None Noted Violent Behavior Description: None  Does patient have access to weapons?: No Criminal Charges Pending?: Yes Describe Pending Criminal Charges: Drunk & Disruptive  Does patient have a court date: Yes Court Date: 01/25/12  Psychosis Hallucinations: Auditory;With command Delusions: None noted  Mental Status Report Appear/Hygiene: Disheveled;Body odor Eye Contact: Poor Motor Activity: Unremarkable Speech: Logical/coherent Level of Consciousness: Quiet/awake Mood: Depressed;Sad Affect: Depressed;Sad Anxiety Level: None Thought Processes: Coherent;Relevant Judgement: Impaired Orientation: Person;Place;Time;Situation Obsessive Compulsive Thoughts/Behaviors: None  Cognitive Functioning Concentration: Normal Memory: Recent Intact;Remote Intact IQ: Average Insight: Poor Impulse Control: Poor Appetite: Good Weight Loss: 0  Weight Gain: 0  Sleep: No Change Total Hours of Sleep: 6    Vegetative Symptoms: None  ADLScreening [facility]) Patient's cognitive ability adequate to safely complete daily activities?: Yes Patient able to express need for assistance with ADLs?: Yes Independently performs ADLs?: Yes (appropriate for developmental age)  Abuse/Neglect [facility]) Physical Abuse: Denies Verbal Abuse: Denies Sexual Abuse: Denies  Prior Inpatient Therapy Prior Inpatient Therapy: Yes Prior Therapy Dates: Unk  Prior Therapy Facilty/Provider(s): [facility]  Reason for Treatment: SI/depression   Prior Outpatient Therapy Prior Outpatient Therapy: No Prior Therapy Dates: None  Prior Therapy Facilty/Provider(s): None  Reason for Treatment: None   ADL Screening (condition at time of admission) Patient's cognitive ability adequate to safely complete daily activities?: Yes Patient able to express need for assistance with ADLs?: Yes Independently performs ADLs?: Yes (appropriate for developmental age) Weakness of Legs: None Weakness of Arms/Hands: None  Home Assistive Devices/Equipment Home Assistive Devices/Equipment: None  Therapy Consults (therapy consults require a physician order) PT Evaluation Needed: No OT Evalulation Needed: No SLP Evaluation Needed: No Abuse/Neglect Assessment (Assessment to be complete while patient is alone) Physical Abuse: Denies Verbal Abuse: Denies Sexual Abuse: Denies Exploitation of patient/patient's resources: Denies Self-Neglect: Denies Values / Beliefs Cultural Requests During Hospitalization: None Spiritual Requests During Hospitalization: None Consults Spiritual Care Consult Needed: No Social Work Consult Needed: No Merchant navy officer (For Healthcare) Advance Directive: Patient does not have advance directive;Patient would not like information Pre-existing out of facility DNR order (yellow form or pink MOST form): No Nutrition Screen- [facility] Patient's home diet: Regular  Have you recently lost weight  without trying?: No Have you been eating poorly because of a decreased appetite?: No Malnutrition Screening Tool Score: 0   Additional Information 1:1 In Past 12 Months?: No CIRT Risk: No Elopement Risk: No Does patient have medical clearance?: Yes     Disposition:  Disposition Disposition of Patient: Inpatient treatment program;Referred to [facility] ) Type of inpatient treatment program: Adult Patient referred to: Other (Comment) [facility] )  On Site Evaluation by:   Reviewed with Physician:     Murrell Redden 01/16/2012 2:46 PM

## 2012-01-16 NOTE — ED Notes (Signed)
Respirations even, a little shallow but unlabored.

## 2012-01-16 NOTE — ED Notes (Signed)
Talked to [facility] in the scanning department and he walked me through getting them, they've been printed and put back in the chart.

## 2012-01-16 NOTE — BHH Counselor (Addendum)
Pt continues to be too drowsy to assess. Pt was medicated while in triage b/c aggressive behavior. Pt was wheeled into psych ed in wheelchair b/c pt was too drowsy to walk.

## 2012-01-16 NOTE — ED Notes (Signed)
Pt walked to radiology with 2 radiology staff, 2 security, off duty Stage manager. X-ray completed without incident. Pt breathes harder when he walks and is coughing. He reports he is coughing up phlegm.

## 2012-01-16 NOTE — ED Notes (Signed)
Pt taken over to TCU with respiratory therapist, tech and security for nebulizer treatment.

## 2012-01-16 NOTE — ED Notes (Signed)
telepsych called in and faxed.

## 2012-01-20 ENCOUNTER — Ambulatory Visit (INDEPENDENT_AMBULATORY_CARE_PROVIDER_SITE_OTHER): Payer: Federal, State, Local not specified - Other | Admitting: Internal Medicine

## 2012-01-20 ENCOUNTER — Encounter: Payer: Self-pay | Admitting: Internal Medicine

## 2012-01-20 ENCOUNTER — Ambulatory Visit: Payer: Self-pay | Admitting: Internal Medicine

## 2012-01-20 ENCOUNTER — Ambulatory Visit: Payer: Self-pay

## 2012-01-20 VITALS — BP 118/80 | HR 114 | Temp 98.5°F | Ht 66.0 in | Wt 179.5 lb

## 2012-01-20 DIAGNOSIS — Z590 Homelessness: Secondary | ICD-10-CM | POA: Insufficient documentation

## 2012-01-20 DIAGNOSIS — F17200 Nicotine dependence, unspecified, uncomplicated: Secondary | ICD-10-CM

## 2012-01-20 DIAGNOSIS — Z23 Encounter for immunization: Secondary | ICD-10-CM

## 2012-01-20 DIAGNOSIS — F17210 Nicotine dependence, cigarettes, uncomplicated: Secondary | ICD-10-CM | POA: Insufficient documentation

## 2012-01-20 NOTE — Progress Notes (Signed)
Patient ID: John [name], male   DOB: 05-Mar-1971, 41 y.o.   MRN: [id number]     [facility] for Infectious Disease  Patient Active Problem List  Diagnosis  . HIV INFECTION  . Alcohol abuse  . Psychoactive substance-induced organic mood disorder  . Cigarette smoker  . Homeless    Patient's Medications  New Prescriptions   No medications on file  Previous Medications   ALBUTEROL (PROVENTIL HFA;VENTOLIN HFA) 108 (90 BASE) MCG/ACT INHALER    Inhale 1-2 puffs into the lungs every 6 (six) hours as needed for wheezing.   FLUTICASONE-SALMETEROL (ADVAIR DISKUS) 250-50 MCG/DOSE AEPB    Inhale 1 puff into the lungs 2 (two) times daily.  Modified Medications   No medications on file  Discontinued Medications   ALBUTEROL (PROVENTIL HFA;VENTOLIN HFA) 108 (90 BASE) MCG/ACT INHALER    Inhale 2 puffs into the lungs every 6 (six) hours as needed. For wheezing    Subjective: John [name] is in for his first visit since June. He continues to struggle with chronic alcoholism drinking over a fifth of liquor and a 12 pack of beer daily on most days. He states that he has been drinking heavily since he was 41 years old. He is currently homeless and has been moving from friends houses to his grandmothers and occasionally sleeping on park bench. He was just released from behavioral health again because of depression and suicidal thoughts related to his heavy alcohol consumption. Since release he states he's gone to one AA meeting at [facility] but does not feel like he needs to go regularly and has not thought about getting a sponsor. He is not working currently but has food stamps. He has not been sexually active. His only medication is his albuterol inhaler. He is smoking cigarettes and does not feel like he is able to consider quitting now. He was enrolled in the START study and is not on any antiretroviral therapy.  Objective: Temp: 98.5 F (36.9 C) (11/07 1022) Temp src: Oral (11/07 1022) BP: 118/80 mmHg  (11/07 1022) Pulse Rate: 114  (11/07 1022)  General: He is nervous and his eyes are bloodshot Skin: No rash Lungs: Clear Cor: Regular S1-S2 no murmurs Abdomen: Soft and nontender His mood appears appropriate  Lab Results HIV 1 RNA Quant (copies/mL)  Date Value  12/31/2011 1355*  08/30/2011 1848*  05/11/2011 448*     CD4 T Cell Abs (cmm)  Date Value  06/25/2010 520   04/08/2010 1180      Assessment: He has asymptomatic HIV infection with a normal CD4 count. I will talk to Deirdre Evener, our study coordinator about his involvement in the START study. Most importantly we will arrange case management to address his homelessness. He is somewhat agreeable to seeing our mental health and substance abuse counselor here but since transportation is a concern. It also appears that he has significant denial which will be a barrier to care. I will see him back within 2 weeks.  Plan: 1. Continue observation off of antiretroviral therapy 2. Encouraged mental health and substance abuse counseling 3. Case management 4. Followup in 2 weeks   Cliffton Asters, MD [facility] [phone] pager   [phone] cell 01/20/2012, 10:48 AM

## 2012-01-24 ENCOUNTER — Ambulatory Visit: Payer: Self-pay

## 2012-01-26 ENCOUNTER — Encounter (HOSPITAL_COMMUNITY): Payer: Self-pay | Admitting: Emergency Medicine

## 2012-01-26 ENCOUNTER — Emergency Department (HOSPITAL_COMMUNITY)
Admission: EM | Admit: 2012-01-26 | Discharge: 2012-01-26 | Disposition: A | Discharge status: Home / Self Care | Payer: Federal, State, Local not specified - Other | Attending: Emergency Medicine | Admitting: Emergency Medicine

## 2012-01-26 ENCOUNTER — Emergency Department (HOSPITAL_COMMUNITY): Payer: Federal, State, Local not specified - Other

## 2012-01-26 DIAGNOSIS — Z79899 Other long term (current) drug therapy: Secondary | ICD-10-CM | POA: Insufficient documentation

## 2012-01-26 DIAGNOSIS — Z21 Asymptomatic human immunodeficiency virus [HIV] infection status: Secondary | ICD-10-CM | POA: Insufficient documentation

## 2012-01-26 DIAGNOSIS — F17200 Nicotine dependence, unspecified, uncomplicated: Secondary | ICD-10-CM | POA: Insufficient documentation

## 2012-01-26 DIAGNOSIS — Z8659 Personal history of other mental and behavioral disorders: Secondary | ICD-10-CM | POA: Insufficient documentation

## 2012-01-26 DIAGNOSIS — J45909 Unspecified asthma, uncomplicated: Secondary | ICD-10-CM | POA: Insufficient documentation

## 2012-01-26 DIAGNOSIS — M25539 Pain in unspecified wrist: Secondary | ICD-10-CM | POA: Insufficient documentation

## 2012-01-26 MED ORDER — INDOMETHACIN 25 MG PO CAPS: 25.0000 mg | ORAL_CAPSULE | Freq: Three times a day (TID) | ORAL | Status: DC | PRN

## 2012-01-26 MED ORDER — OXYCODONE-ACETAMINOPHEN 5-325 MG PO TABS
1.0000 | ORAL_TABLET | Freq: Once | ORAL | Status: AC
  Administered 2012-01-26: 1 via ORAL
  Filled 2012-01-26: qty 1

## 2012-01-26 MED ORDER — HYDROCODONE-ACETAMINOPHEN 5-325 MG PO TABS: 1.0000 | ORAL_TABLET | Freq: Four times a day (QID) | ORAL | Status: DC | PRN

## 2012-01-26 NOTE — ED Provider Notes (Signed)
History     CSN: [id number]  Arrival date & time 01/26/12  7829   First MD Initiated Contact with Patient 01/26/12 1012      Chief Complaint  Patient presents with  . Wrist Pain    HPI Patient presents to emergency room complaining of wrist pain ongoing for the last 2 days. He woke up this morning and states that the pain has increased. He feels like his wrist is somewhat swollen. It increases with pain and movement. The pain is located primarily on the ulnar aspect of the right wrist he denies any recent falls or injuries. He has not had any fever. He denies any IV drug abuse.  The patient does have a past medical significant for HIV disease.  He has a normal T-cell count according to his records at the infectious disease clinic.  He was last seen within the past week. Patient also has history of substance abuse and alcoholism. Past Medical History  Diagnosis Date  . Asthma   . HIV (human immunodeficiency virus infection)   . Mental disorder   . Anxiety   . Depression     History reviewed. No pertinent past surgical history.  No family history on file.  History  Substance Use Topics  . Smoking status: Current Every Day Smoker -- 0.5 packs/day    Types: Cigarettes  . Smokeless tobacco: Never Used  . Alcohol Use: 0.0 oz/week     Comment: drinking 1.5 fifths of vodka, beer 12-pack /day      Review of Systems  All other systems reviewed and are negative.    Allergies  Shellfish allergy  Home Medications   Current Outpatient Rx  Name  Route  Sig  Dispense  Refill  . ALBUTEROL SULFATE HFA 108 (90 BASE) MCG/ACT IN AERS   Inhalation   Inhale 1-2 puffs into the lungs every 6 (six) hours as needed for wheezing.   1 Inhaler   0   . FLUTICASONE-SALMETEROL 250-50 MCG/DOSE IN AEPB   Inhalation   Inhale 2 puffs into the lungs every 12 (twelve) hours.         Marland Kitchen NAPROXEN 500 MG PO TABS   Oral   Take 500 mg by mouth 2 (two) times daily with a meal. For pain.          BP 119/73  Pulse 69  Temp 98.1 F (36.7 C) (Oral)  Resp 20  Ht 5\' 10"  (1.778 m)  Wt 150 lb (68.04 kg)  BMI 21.52 kg/m2  SpO2 97%  Physical Exam  Nursing note and vitals reviewed. Constitutional: He appears well-developed and well-nourished. No distress.  HENT:  Head: Normocephalic and atraumatic.  Right Ear: External ear normal.  Left Ear: External ear normal.  Eyes: Conjunctivae normal are normal. Right eye exhibits no discharge. Left eye exhibits no discharge. No scleral icterus.  Neck: Neck supple. No tracheal deviation present.  Cardiovascular: Normal rate.   Pulmonary/Chest: Effort normal. No stridor. No respiratory distress.  Musculoskeletal: He exhibits tenderness. He exhibits no edema.       Right wrist: He exhibits decreased range of motion and tenderness. He exhibits no swelling, no effusion, no crepitus, no deformity and no laceration.       Right hand: He exhibits decreased range of motion and tenderness. He exhibits no bony tenderness, normal capillary refill and no swelling. normal sensation noted. Normal strength noted.  Neurological: He is alert. Cranial nerve deficit: no gross deficits.  Skin: Skin is warm and dry.  No rash noted.  Psychiatric: He has a normal mood and affect.    ED Course  Procedures (including critical care time)  Labs Reviewed - No data to display Dg Wrist Complete Right  01/26/2012  *RADIOLOGY REPORT*  Clinical Data: Wrist pain, no injury  RIGHT WRIST - COMPLETE 3+ VIEW  Comparison: None.  Findings: The radiocarpal joint space appears normal and the ulnar styloid is intact and the carpal bones are in normal position.  No acute bony abnormality is seen.  Normal alignment is present.  IMPRESSION: Negative.   Original Report Authenticated By: Dwyane Dee, M.D.      1. Wrist pain       MDM  The patient's symptoms are suggestive of a wrist tendinitis. Doubt is another possibility considering his chronic alcohol use. He does have HIV  however has a normal CD4 count. I have low suspicion for an infectious arthritis. Patient will be placed in a splint. I will give a prescription for hydrocodone and Indocin. Recommend that he followup with his doctor has been instructed to return emergently as needed for worsening symptoms, fever        Celene Kras, MD 01/26/12 1200

## 2012-01-26 NOTE — ED Notes (Signed)
Ortho called to come place wrist splint.

## 2012-01-26 NOTE — Progress Notes (Signed)
Orthopedic Tech Progress Note Patient Details:  John [name] 07/25/1970 [id number]  Ortho Devices Type of Ortho Device: Velcro wrist splint Ortho Device/Splint Location: RIGHT WRIST SPLLINT Ortho Device/Splint Interventions: Application   [name], [name] 01/26/2012, 11:44 AM

## 2012-01-26 NOTE — ED Notes (Signed)
Ortho at bedside to place splint.

## 2012-01-26 NOTE — ED Notes (Signed)
Patient claims that he has been having R wrist pain x 2 days.  Patient claims that he has been taking some sort of pain medication at home, but not sure what.  Patient advises that he woke up this morning at 0645 with increased pain and swelling into his R hand.

## 2012-01-27 ENCOUNTER — Ambulatory Visit: Payer: Federal, State, Local not specified - Other

## 2012-01-27 DIAGNOSIS — F39 Unspecified mood [affective] disorder: Secondary | ICD-10-CM

## 2012-01-27 DIAGNOSIS — F1020 Alcohol dependence, uncomplicated: Secondary | ICD-10-CM

## 2012-01-27 NOTE — Progress Notes (Signed)
I met with John [name] for the first time today and he reported that he is an alcoholic.  He also reported that he is homeless and living under a bridge.  He was very straightforward and cooperative with the many questions asked.  He reports that he has been drinking regularly since he was 41 years old.  He has not worked since 1990 or '91.  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  He said his mother was an alcoholic and drank while pregnant with him.  He completed the 9th grade and says he took some "Learning Disability" classes.  He reports that he is currently depressed with a mood of 5 on a 10 pt scale.  He admits to some suicidal ideation with thoughts of stepping in front of a car or bus.  He has a partner who is also homeless.  He says he cannot return to the shelter for several weeks because he left there a few weeks ago.  He agreed to return for an appointment next week.

## 2012-02-03 ENCOUNTER — Ambulatory Visit: Payer: Self-pay

## 2012-02-08 ENCOUNTER — Telehealth: Payer: Self-pay | Admitting: *Deleted

## 2012-02-08 ENCOUNTER — Ambulatory Visit: Payer: Self-pay | Admitting: Internal Medicine

## 2012-02-08 NOTE — Telephone Encounter (Signed)
Left message for pt to call [facility] for a new appt.  Number given.

## 2012-03-09 ENCOUNTER — Encounter (HOSPITAL_COMMUNITY): Payer: Self-pay | Admitting: *Deleted

## 2012-03-09 ENCOUNTER — Emergency Department (HOSPITAL_COMMUNITY)
Admission: EM | Admit: 2012-03-09 | Discharge: 2012-03-09 | Disposition: A | Discharge status: Home / Self Care | Payer: Federal, State, Local not specified - Other | Attending: Emergency Medicine | Admitting: Emergency Medicine

## 2012-03-09 DIAGNOSIS — F1010 Alcohol abuse, uncomplicated: Secondary | ICD-10-CM | POA: Insufficient documentation

## 2012-03-09 DIAGNOSIS — F10929 Alcohol use, unspecified with intoxication, unspecified: Secondary | ICD-10-CM

## 2012-03-09 DIAGNOSIS — F3289 Other specified depressive episodes: Secondary | ICD-10-CM | POA: Insufficient documentation

## 2012-03-09 DIAGNOSIS — R45851 Suicidal ideations: Secondary | ICD-10-CM | POA: Insufficient documentation

## 2012-03-09 DIAGNOSIS — Z79899 Other long term (current) drug therapy: Secondary | ICD-10-CM | POA: Insufficient documentation

## 2012-03-09 DIAGNOSIS — B20 Human immunodeficiency virus [HIV] disease: Secondary | ICD-10-CM | POA: Insufficient documentation

## 2012-03-09 DIAGNOSIS — R062 Wheezing: Secondary | ICD-10-CM | POA: Insufficient documentation

## 2012-03-09 DIAGNOSIS — F1210 Cannabis abuse, uncomplicated: Secondary | ICD-10-CM | POA: Insufficient documentation

## 2012-03-09 DIAGNOSIS — J45909 Unspecified asthma, uncomplicated: Secondary | ICD-10-CM | POA: Insufficient documentation

## 2012-03-09 DIAGNOSIS — F17200 Nicotine dependence, unspecified, uncomplicated: Secondary | ICD-10-CM | POA: Insufficient documentation

## 2012-03-09 DIAGNOSIS — Z8659 Personal history of other mental and behavioral disorders: Secondary | ICD-10-CM | POA: Insufficient documentation

## 2012-03-09 DIAGNOSIS — F329 Major depressive disorder, single episode, unspecified: Secondary | ICD-10-CM

## 2012-03-09 LAB — CBC WITH DIFFERENTIAL/PLATELET
Basophils Absolute: 0 10*3/uL (ref 0.0–0.1)
Basophils Relative: 0 % (ref 0–1)
Eosinophils Absolute: 0.1 10*3/uL (ref 0.0–0.7)
Eosinophils Relative: 2 % (ref 0–5)
Lymphs Abs: 3.4 10*3/uL (ref 0.7–4.0)
MCH: 31.4 pg (ref 26.0–34.0)
MCV: 91.3 fL (ref 78.0–100.0)
Neutrophils Relative %: 37 % — ABNORMAL LOW (ref 43–77)
Platelets: 351 10*3/uL (ref 150–400)
RBC: 4.62 MIL/uL (ref 4.22–5.81)
RDW: 13.3 % (ref 11.5–15.5)

## 2012-03-09 LAB — COMPREHENSIVE METABOLIC PANEL
ALT: 21 U/L (ref 0–53)
AST: 37 U/L (ref 0–37)
Albumin: 3.6 g/dL (ref 3.5–5.2)
Alkaline Phosphatase: 64 U/L (ref 39–117)
Calcium: 8.9 mg/dL (ref 8.4–10.5)
GFR calc Af Amer: >90 mL/min (ref >90)
Potassium: 4.1 mEq/L (ref 3.5–5.1)
Sodium: 141 mEq/L (ref 135–145)
Total Protein: 7.5 g/dL (ref 6.0–8.3)

## 2012-03-09 LAB — RAPID URINE DRUG SCREEN, HOSP PERFORMED
Amphetamines: NOT DETECTED
Barbiturates: NOT DETECTED
Benzodiazepines: NOT DETECTED
Cocaine: POSITIVE — AB
Tetrahydrocannabinol: POSITIVE — AB

## 2012-03-09 MED ORDER — NICOTINE 21 MG/24HR TD PT24
21.0000 mg | MEDICATED_PATCH | Freq: Every day | TRANSDERMAL | Status: DC
  Administered 2012-03-09: 21 mg via TRANSDERMAL

## 2012-03-09 MED ORDER — ONDANSETRON HCL 4 MG PO TABS: 4.0000 mg | ORAL_TABLET | Freq: Three times a day (TID) | ORAL | Status: DC | PRN

## 2012-03-09 MED ORDER — ALBUTEROL SULFATE (5 MG/ML) 0.5% IN NEBU
5.0000 mg | INHALATION_SOLUTION | Freq: Once | RESPIRATORY_TRACT | Status: AC
  Administered 2012-03-09: 5 mg via RESPIRATORY_TRACT
  Filled 2012-03-09: qty 1

## 2012-03-09 MED ORDER — IPRATROPIUM BROMIDE 0.02 % IN SOLN
0.5000 mg | Freq: Once | RESPIRATORY_TRACT | Status: AC
  Administered 2012-03-09: 0.5 mg via RESPIRATORY_TRACT
  Filled 2012-03-09: qty 2.5

## 2012-03-09 MED ORDER — ALUM & MAG HYDROXIDE-SIMETH 200-200-20 MG/5ML PO SUSP: 30.0000 mL | ORAL | Status: DC | PRN

## 2012-03-09 MED ORDER — NAPROXEN 500 MG PO TABS
500.0000 mg | ORAL_TABLET | Freq: Two times a day (BID) | ORAL | Status: DC
  Administered 2012-03-09 (×2): 500 mg via ORAL
  Filled 2012-03-09 (×2): qty 1

## 2012-03-09 MED ORDER — ACETAMINOPHEN 325 MG PO TABS: 650.0000 mg | ORAL_TABLET | ORAL | Status: DC | PRN

## 2012-03-09 MED ORDER — ALBUTEROL SULFATE HFA 108 (90 BASE) MCG/ACT IN AERS: 1.0000 | INHALATION_SPRAY | Freq: Four times a day (QID) | RESPIRATORY_TRACT | Status: DC | PRN

## 2012-03-09 MED ORDER — MOMETASONE FURO-FORMOTEROL FUM 100-5 MCG/ACT IN AERO
2.0000 | INHALATION_SPRAY | Freq: Two times a day (BID) | RESPIRATORY_TRACT | Status: DC
  Administered 2012-03-09: 2 via RESPIRATORY_TRACT
  Filled 2012-03-09: qty 8.8

## 2012-03-09 MED ORDER — IBUPROFEN 200 MG PO TABS: 600.0000 mg | ORAL_TABLET | Freq: Three times a day (TID) | ORAL | Status: DC | PRN

## 2012-03-09 MED ORDER — LORAZEPAM 1 MG PO TABS
1.0000 mg | ORAL_TABLET | Freq: Three times a day (TID) | ORAL | Status: DC | PRN
  Administered 2012-03-09: 1 mg via ORAL
  Filled 2012-03-09: qty 1

## 2012-03-09 MED ORDER — INDOMETHACIN 25 MG PO CAPS
25.0000 mg | ORAL_CAPSULE | Freq: Three times a day (TID) | ORAL | Status: DC | PRN
  Filled 2012-03-09: qty 1

## 2012-03-09 MED ORDER — ZOLPIDEM TARTRATE 10 MG PO TABS
10.0000 mg | ORAL_TABLET | Freq: Every evening | ORAL | Status: DC | PRN
  Filled 2012-03-09: qty 1

## 2012-03-09 NOTE — ED Notes (Signed)
[id number]<QM> Expected date:<BR> Expected time:<BR> Means of arrival:<BR> Comments:<BR> EMS/SOB-getting albuterol neb-also c/o SI

## 2012-03-09 NOTE — ED Notes (Signed)
2 bags in locker 41 
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
## 2012-03-09 NOTE — ED Provider Notes (Addendum)
Psychiatric consultation was reviewed. Psychiatrist feels that patient psychiatrically stable and does not need a psych hospitalization. Recommending placement for alcohol detox. Will try to facilitate this. If cannot patient will be discharged with outpatient followup and resources will be provided.  Raeford Razor, MD 03/09/12 0825  Filed Vitals:   03/09/12 1300  BP: 128/71  Pulse: 75  Temp: 97.9 F (36.6 C)  Resp: 16   Pt remains HD stable. Alcohol only 121 over 12 hours ago. No florid withdrawal symptoms. No basis for inpatient treatment. Will discharge with outpatient resources.   Raeford Razor, MD 03/09/12 [id number]

## 2012-03-09 NOTE — ED Notes (Signed)
Pt to room via EMS for complaints of suicidal / homicidal ideations; pt states that he went to mental health 12/25 am but they were not open; pt states he then went to the area shelters but was unable to find a place to sleep so he walked to the jail and called EMS to come to ER to evaluate asthma and for his complaints of suicidal ideations and homicidal ideations; Upon arrival pt is ambulatory and able to talk in complete sentences; pt ambulated to the BR without difficulty; No resp distress noted.

## 2012-03-14 ENCOUNTER — Emergency Department (HOSPITAL_COMMUNITY): Payer: Self-pay

## 2012-03-14 ENCOUNTER — Emergency Department (HOSPITAL_COMMUNITY)
Admission: EM | Admit: 2012-03-14 | Discharge: 2012-03-14 | Disposition: A | Discharge status: Home / Self Care | Payer: Self-pay | Attending: Emergency Medicine | Admitting: Emergency Medicine

## 2012-03-14 ENCOUNTER — Encounter (HOSPITAL_COMMUNITY): Payer: Self-pay | Admitting: *Deleted

## 2012-03-14 DIAGNOSIS — R059 Cough, unspecified: Secondary | ICD-10-CM | POA: Insufficient documentation

## 2012-03-14 DIAGNOSIS — Z79899 Other long term (current) drug therapy: Secondary | ICD-10-CM | POA: Insufficient documentation

## 2012-03-14 DIAGNOSIS — Z21 Asymptomatic human immunodeficiency virus [HIV] infection status: Secondary | ICD-10-CM | POA: Insufficient documentation

## 2012-03-14 DIAGNOSIS — Z5900 Homelessness unspecified: Secondary | ICD-10-CM | POA: Insufficient documentation

## 2012-03-14 DIAGNOSIS — S0993XA Unspecified injury of face, initial encounter: Secondary | ICD-10-CM | POA: Insufficient documentation

## 2012-03-14 DIAGNOSIS — R05 Cough: Secondary | ICD-10-CM | POA: Insufficient documentation

## 2012-03-14 DIAGNOSIS — R0789 Other chest pain: Secondary | ICD-10-CM | POA: Insufficient documentation

## 2012-03-14 DIAGNOSIS — F17200 Nicotine dependence, unspecified, uncomplicated: Secondary | ICD-10-CM | POA: Insufficient documentation

## 2012-03-14 DIAGNOSIS — S199XXA Unspecified injury of neck, initial encounter: Secondary | ICD-10-CM | POA: Insufficient documentation

## 2012-03-14 DIAGNOSIS — Z8659 Personal history of other mental and behavioral disorders: Secondary | ICD-10-CM | POA: Insufficient documentation

## 2012-03-14 DIAGNOSIS — J45909 Unspecified asthma, uncomplicated: Secondary | ICD-10-CM | POA: Insufficient documentation

## 2012-03-14 MED ORDER — LORAZEPAM 1 MG PO TABS
1.0000 mg | ORAL_TABLET | Freq: Once | ORAL | Status: AC
  Administered 2012-03-14: 1 mg via ORAL
  Filled 2012-03-14: qty 1

## 2012-03-14 MED ORDER — OXYCODONE-ACETAMINOPHEN 5-325 MG PO TABS
1.0000 | ORAL_TABLET | Freq: Once | ORAL | Status: AC
  Administered 2012-03-14: 1 via ORAL
  Filled 2012-03-14: qty 1
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
## 2012-03-23 ENCOUNTER — Other Ambulatory Visit: Payer: Self-pay | Admitting: Internal Medicine

## 2012-03-23 ENCOUNTER — Other Ambulatory Visit (INDEPENDENT_AMBULATORY_CARE_PROVIDER_SITE_OTHER): Payer: Federal, State, Local not specified - Other

## 2012-03-23 DIAGNOSIS — Z113 Encounter for screening for infections with a predominantly sexual mode of transmission: Secondary | ICD-10-CM

## 2012-03-23 DIAGNOSIS — B20 Human immunodeficiency virus [HIV] disease: Secondary | ICD-10-CM

## 2012-03-23 DIAGNOSIS — Z79899 Other long term (current) drug therapy: Secondary | ICD-10-CM

## 2012-03-23 LAB — LIPID PANEL
LDL Cholesterol: 61 mg/dL (ref 0–99)
Total CHOL/HDL Ratio: 2.3 Ratio
VLDL: 17 mg/dL (ref 0–40)

## 2012-03-24 LAB — T-HELPER CELL (CD4) - (RCID CLINIC ONLY)
CD4 % Helper T Cell: 34 % (ref 33–55)
CD4 T Cell Abs: 680 uL (ref 400–2700)

## 2012-03-24 LAB — HIV-1 RNA QUANT-NO REFLEX-BLD: HIV 1 RNA Quant: 1415 copies/mL — ABNORMAL HIGH (ref <20)

## 2012-04-08 ENCOUNTER — Emergency Department (HOSPITAL_COMMUNITY): Payer: Self-pay

## 2012-04-08 ENCOUNTER — Encounter (HOSPITAL_COMMUNITY): Payer: Self-pay | Admitting: *Deleted

## 2012-04-08 ENCOUNTER — Emergency Department (HOSPITAL_COMMUNITY)
Admission: EM | Admit: 2012-04-08 | Discharge: 2012-04-09 | Disposition: A | Discharge status: Home / Self Care | Payer: Self-pay | Attending: Emergency Medicine | Admitting: Emergency Medicine

## 2012-04-08 DIAGNOSIS — Z21 Asymptomatic human immunodeficiency virus [HIV] infection status: Secondary | ICD-10-CM | POA: Insufficient documentation

## 2012-04-08 DIAGNOSIS — R0602 Shortness of breath: Secondary | ICD-10-CM | POA: Insufficient documentation

## 2012-04-08 DIAGNOSIS — F1210 Cannabis abuse, uncomplicated: Secondary | ICD-10-CM | POA: Insufficient documentation

## 2012-04-08 DIAGNOSIS — J45901 Unspecified asthma with (acute) exacerbation: Secondary | ICD-10-CM | POA: Insufficient documentation

## 2012-04-08 DIAGNOSIS — Z8659 Personal history of other mental and behavioral disorders: Secondary | ICD-10-CM | POA: Insufficient documentation

## 2012-04-08 DIAGNOSIS — R9430 Abnormal result of cardiovascular function study, unspecified: Secondary | ICD-10-CM | POA: Insufficient documentation

## 2012-04-08 DIAGNOSIS — Z79899 Other long term (current) drug therapy: Secondary | ICD-10-CM | POA: Insufficient documentation

## 2012-04-08 DIAGNOSIS — F17200 Nicotine dependence, unspecified, uncomplicated: Secondary | ICD-10-CM | POA: Insufficient documentation

## 2012-04-08 DIAGNOSIS — R000 Tachycardia, unspecified: Secondary | ICD-10-CM | POA: Insufficient documentation

## 2012-04-08 LAB — CBC WITH DIFFERENTIAL/PLATELET
Basophils Relative: 0 % (ref 0–1)
Eosinophils Absolute: 0.2 10*3/uL (ref 0.0–0.7)
Hemoglobin: 15.4 g/dL (ref 13.0–17.0)
MCH: 32 pg (ref 26.0–34.0)
MCHC: 34.8 g/dL (ref 30.0–36.0)
Monocytes Relative: 9 % (ref 3–12)
Neutrophils Relative %: 44 % (ref 43–77)
Platelets: 304 10*3/uL (ref 150–400)

## 2012-04-08 LAB — BASIC METABOLIC PANEL
BUN: 11 mg/dL (ref 6–23)
GFR calc Af Amer: >90 mL/min (ref >90)
GFR calc non Af Amer: >90 mL/min (ref >90)
Potassium: 3.6 mEq/L (ref 3.5–5.1)

## 2012-04-08 MED ORDER — ALBUTEROL SULFATE (5 MG/ML) 0.5% IN NEBU: 2.5000 mg | INHALATION_SOLUTION | Freq: Once | RESPIRATORY_TRACT | Status: DC

## 2012-04-08 MED ORDER — SODIUM CHLORIDE 0.9 % IV SOLN
1000.0000 mL | Freq: Once | INTRAVENOUS | Status: AC
  Administered 2012-04-08: 1000 mL via INTRAVENOUS

## 2012-04-08 MED ORDER — LORAZEPAM 2 MG/ML IJ SOLN
1.0000 mg | Freq: Once | INTRAMUSCULAR | Status: AC
  Administered 2012-04-08: 1 mg via INTRAVENOUS
  Filled 2012-04-08: qty 1

## 2012-04-08 MED ORDER — ALBUTEROL (5 MG/ML) CONTINUOUS INHALATION SOLN
10.0000 mg/h | INHALATION_SOLUTION | RESPIRATORY_TRACT | Status: AC
  Administered 2012-04-08: 10 mg/h via RESPIRATORY_TRACT
  Filled 2012-04-08: qty 80

## 2012-04-08 MED ORDER — METHYLPREDNISOLONE SODIUM SUCC 125 MG IJ SOLR
125.0000 mg | Freq: Once | INTRAMUSCULAR | Status: AC
  Administered 2012-04-08: 125 mg via INTRAVENOUS
  Filled 2012-04-08: qty 2

## 2012-04-08 MED ORDER — IPRATROPIUM BROMIDE 0.02 % IN SOLN
0.5000 mg | Freq: Once | RESPIRATORY_TRACT | Status: AC
  Administered 2012-04-08: 0.5 mg via RESPIRATORY_TRACT
  Filled 2012-04-08: qty 2.5

## 2012-04-08 MED ORDER — SODIUM CHLORIDE 0.9 % IV SOLN: 1000.0000 mL | INTRAVENOUS | Status: DC

## 2012-04-08 MED ORDER — ALBUTEROL SULFATE (5 MG/ML) 0.5% IN NEBU
2.5000 mg | INHALATION_SOLUTION | Freq: Once | RESPIRATORY_TRACT | Status: AC
  Administered 2012-04-08: 2.5 mg via RESPIRATORY_TRACT
  Filled 2012-04-08 (×2): qty 0.5

## 2012-04-08 NOTE — ED Notes (Signed)
Pt arrives with significant sob.  Pt with hx of asthma.  Pt is able to answer in single words.  Spo2 96% on RA.  SOB decreases when pt sits down.

## 2012-04-08 NOTE — ED Provider Notes (Signed)
History     CSN: [id number]  Arrival date & time 04/08/12  1627   First MD Initiated Contact with Patient 04/08/12 1634      Chief Complaint  Patient presents with  . Asthma    (Consider location/radiation/quality/duration/timing/severity/associated sxs/prior treatment) HPI Comments: This is a 41 year old male, past medical history remarkable for HIV, and asthma, who presents emergency department with chief complaint of asthma exacerbation. Patient states that he called EMS twice in the past 2 days because his asthma has been flaring up. He has received nebulizer treatments with some relief, but states that the symptoms return. Therefore, he decided to come to the emergency department today. He states that he has run out of his albuterol medication. He denies fever, headache, chest pain, nausea, vomiting, diarrhea, constipation, numbness and tingling of the extremities. He does not know his CD4 count or viral load.  The history is provided by the patient. No language interpreter was used.    Past Medical History  Diagnosis Date  . Asthma   . HIV (human immunodeficiency virus infection)   . Mental disorder   . Anxiety   . Depression     History reviewed. No pertinent past surgical history.  No family history on file.  History  Substance Use Topics  . Smoking status: Current Every Day Smoker -- 1.0 packs/day    Types: Cigarettes  . Smokeless tobacco: Never Used  . Alcohol Use: 0.0 oz/week     Comment: drinking 1.5 fifths of vodka, beer 12-pack /day      Review of Systems  All other systems reviewed and are negative.    Allergies  Shellfish allergy  Home Medications   Current Outpatient Rx  Name  Route  Sig  Dispense  Refill  . ALBUTEROL SULFATE HFA 108 (90 BASE) MCG/ACT IN AERS   Inhalation   Inhale 1-2 puffs into the lungs every 6 (six) hours as needed for wheezing.   1 Inhaler   0     BP 135/89  Pulse 120  Temp 98.8 F (37.1 C) (Oral)  Resp 33   SpO2 97%  Physical Exam  Nursing note and vitals reviewed. Constitutional: He is oriented to person, place, and time. He appears well-developed and well-nourished.  HENT:  Head: Normocephalic and atraumatic.  Mouth/Throat: Oropharyngeal exudate present.  Eyes: Conjunctivae normal and EOM are normal. Right eye exhibits no discharge. Left eye exhibits no discharge. No scleral icterus.  Neck: Normal range of motion. Neck supple. No JVD present.  Cardiovascular: Regular rhythm, normal heart sounds and intact distal pulses.  Exam reveals no gallop and no friction rub.   No murmur heard.      Mildly tachycardic  Pulmonary/Chest: No respiratory distress. He has wheezes. He has no rales. He exhibits no tenderness.       Patient is speaking in short sentences, approximately 5-6 words.  Abdominal: Soft. Bowel sounds are normal. He exhibits no distension and no mass. There is no tenderness. There is no rebound and no guarding.  Musculoskeletal: Normal range of motion. He exhibits no edema and no tenderness.  Neurological: He is alert and oriented to person, place, and time. He has normal reflexes.  Skin: Skin is warm and dry.  Psychiatric: He has a normal mood and affect. His behavior is normal. Judgment and thought content normal.    ED Course  Procedures (including critical care time)   Labs Reviewed  CBC WITH DIFFERENTIAL  BASIC METABOLIC PANEL   Results for orders placed  during the hospital encounter of 04/08/12  CBC WITH DIFFERENTIAL      Component Value Range   WBC 7.0  4.0 - 10.5 K/uL   RBC 4.82  4.22 - 5.81 MIL/uL   Hemoglobin 15.4  13.0 - 17.0 g/dL   HCT 16.1  09.6 - 04.5 %   MCV 91.9  78.0 - 100.0 fL   MCH 32.0  26.0 - 34.0 pg   MCHC 34.8  30.0 - 36.0 g/dL   RDW 40.9  81.1 - 91.4 %   Platelets 304  150 - 400 K/uL   Neutrophils Relative 44  43 - 77 %   Neutro Abs 3.1  1.7 - 7.7 K/uL   Lymphocytes Relative 44  12 - 46 %   Lymphs Abs 3.1  0.7 - 4.0 K/uL   Monocytes Relative  9  3 - 12 %   Monocytes Absolute 0.6  0.1 - 1.0 K/uL   Eosinophils Relative 2  0 - 5 %   Eosinophils Absolute 0.2  0.0 - 0.7 K/uL   Basophils Relative 0  0 - 1 %   Basophils Absolute 0.0  0.0 - 0.1 K/uL  BASIC METABOLIC PANEL      Component Value Range   Sodium 140  135 - 145 mEq/L   Potassium 3.6  3.5 - 5.1 mEq/L   Chloride 98  96 - 112 mEq/L   CO2 20  19 - 32 mEq/L   Glucose, Bld 61 (*) 70 - 99 mg/dL   BUN 11  6 - 23 mg/dL   Creatinine, Ser 7.82  0.50 - 1.35 mg/dL   Calcium 9.4  8.4 - 95.6 mg/dL   GFR calc non Af Amer >90  >90 mL/min   GFR calc Af Amer >90  >90 mL/min  D-DIMER, QUANTITATIVE      Component Value Range   D-Dimer, Quant 1.20 (*) 0.00 - 0.48 ug/mL-FEU   Dg Chest 2 View  04/08/2012  *RADIOLOGY REPORT*  Clinical Data: Asthma.  HIV.  Chest tightness. Shortness of breath.  Prior smoker.  CHEST - 2 VIEW  Comparison: 03/14/2012.  Findings: Mild chronic increased lung markings relatively stable to the prior exam.  Question of nodular density projecting between the anterior aspect of the left fifth and sixth rib possibly nipple shadow.  Repeat PA view with nipple marker placed may be considered to exclude underlying nodule.  Apical pleural thickening on the right unchanged without associate bony destruction.  No pneumothorax or congestive heart failure.  Heart size within normal limits.  IMPRESSION: Question prominent nipple shadow on the left.  Repeat PA view with nipple marker placed recommended.  Chronic lung markings without evidence of segmental infiltrate, congestive heart failure or pneumothorax.   Original Report Authenticated By: Lacy Duverney, M.D.      6:55 PM ED ECG REPORT  I personally interpreted this EKG   Date: 04/08/2012   Rate: 128  Rhythm: sinus tachycardia  QRS Axis: normal  Intervals: normal  ST/T Wave abnormalities: normal  Conduction Disutrbances:none  Narrative Interpretation:   Old EKG Reviewed: none available    1. Asthma exacerbation        MDM  42 year old male with asthma exacerbation. Diffuse wheezes are heard on my exam, I'm going to give the patient nebulizer treatment, 125 of Solu-Medrol IV, and will reevaluate.  6:45 PM Patient complains of racing heart beat.  He is tachycardic, likely due to the albuterol.  I have discussed the patient with Dr. Rhunette Croft, who will  see the patient.    8:47 PM Patient seen by Dr. Rhunette Croft, will give the patient 2 L of fluid, if patient still tachycardic will obtain d-dimer.  10:20 PM Patient's HR is now 106.  The patient originally presented with asthma exacerbation/wheezing.  He improved with nebulizer treatments.  His heart rate is also improving significantly with fluid.  Patient does not have risk factors for PE.  Will not order D-dimer at this time.  Will reassess after additional fluids, and if HR is normal will d/c.  I have discussed this plan with Dr. Rhunette Croft, who agrees with the plan.  10:38 PM Patient's heart rate is back up.  Will order d-dimer.    11:49 PM D-dimer is positive.  Will order CT angio.  Patient's heart rate has been holding steady at 95-100 at rest.   12:49 AM Patient discussed with Dr. Read Drivers, who will continue care at this time.  Plan is to discharge based on CT angio.  Roxy Horseman, PA-C 04/09/12 0105

## 2012-04-09 ENCOUNTER — Emergency Department (HOSPITAL_COMMUNITY): Payer: Self-pay

## 2012-04-09 MED ORDER — PREDNISONE 20 MG PO TABS: 40.0000 mg | ORAL_TABLET | Freq: Every day | ORAL | Status: DC

## 2012-04-09 MED ORDER — IOHEXOL 350 MG/ML SOLN
100.0000 mL | Freq: Once | INTRAVENOUS | Status: AC | PRN
  Administered 2012-04-09: 100 mL via INTRAVENOUS

## 2012-04-09 MED ORDER — ALBUTEROL SULFATE HFA 108 (90 BASE) MCG/ACT IN AERS
2.0000 | INHALATION_SPRAY | RESPIRATORY_TRACT | Status: DC | PRN
  Administered 2012-04-09: 2 via RESPIRATORY_TRACT
  Filled 2012-04-09: qty 6.7

## 2012-04-09 NOTE — ED Provider Notes (Signed)
Medical screening examination/treatment/procedure(s) were conducted as a shared visit with non-physician practitioner(s) and myself.  I personally evaluated the patient during the encounter.  Derwood Kaplan, MD 04/09/12 1559

## 2012-04-10 MED FILL — Albuterol Sulfate Soln Nebu 0.5% (5 MG/ML): RESPIRATORY_TRACT | Qty: 2 | Status: AC

## 2012-04-18 ENCOUNTER — Ambulatory Visit (INDEPENDENT_AMBULATORY_CARE_PROVIDER_SITE_OTHER): Payer: Federal, State, Local not specified - Other | Admitting: Internal Medicine

## 2012-04-18 ENCOUNTER — Encounter: Payer: Self-pay | Admitting: Internal Medicine

## 2012-04-18 ENCOUNTER — Telehealth: Payer: Self-pay | Admitting: *Deleted

## 2012-04-18 VITALS — BP 106/71 | HR 79 | Temp 98.1°F | Wt 175.0 lb

## 2012-04-18 DIAGNOSIS — M79669 Pain in unspecified lower leg: Secondary | ICD-10-CM | POA: Insufficient documentation

## 2012-04-18 DIAGNOSIS — M79609 Pain in unspecified limb: Secondary | ICD-10-CM

## 2012-04-18 NOTE — Telephone Encounter (Signed)
Appointment for Doppler study for right lower extremity at [facility], Wed., Feb 5 @ 1030 to arrive at [facility] Admitting office at 1000.  Requested that the pt return call to obtain appt information.

## 2012-04-18 NOTE — Addendum Note (Signed)
Addended by: Laurell Josephs on: 04/18/2012 10:17 AM   Modules accepted: Orders

## 2012-04-18 NOTE — Progress Notes (Signed)
Patient ID: John [name], male   DOB: 04-22-1970, 42 y.o.   MRN: [id number]     [facility]  Patient Active Problem List  Diagnosis  . HIV INFECTION  . Alcohol abuse  . Psychoactive substance-induced organic mood disorder  . Cigarette smoker  . Homeless  . Calf pain    Patient's Medications  New Prescriptions   No medications on file  Previous Medications   ALBUTEROL (PROVENTIL HFA;VENTOLIN HFA) 108 (90 BASE) MCG/ACT INHALER    Inhale 1-2 puffs into the lungs every 6 (six) hours as needed for wheezing.  Modified Medications   No medications on file  Discontinued Medications   PREDNISONE (DELTASONE) 20 MG TABLET    Take 2 tablets (40 mg total) by mouth daily.    Subjective: John [name] is in for his routine visit. He is currently living with his aunt and feels like that is going to be a stable location for the foreseeable future. He states that he is drinking less alcohol but still has a tendency to drink heavily on weekends. He continues to smoke about 4 cigarettes daily and does not feel inclined to quit.  He was recently in the emergency department with wheezing and was given a brief course of prednisone. He recently ran out of his albuterol inhaler. His wheezing has improved.  3 days ago he began to notice some right calf pain. He has not had any injury to that leg and he has never had this type of problem before. The pain improves the more he gets up and walks around. He has not noted any swelling or redness. He has not had any fever.  He is currently not any counseling for depression or alcoholism. He denies feeling depressed currently but he would like to see our mental health counselor.  Objective: Temp: 98.1 F (36.7 C) (02/04 0912) BP: 106/71 mmHg (02/04 0912) Pulse Rate: 79  (02/04 0912)  General: He is alert and in good spirits Skin: No rash Lungs: Clear without any wheezing Cor: Regular S1 and S2 no murmurs Abdomen: Soft and nontender Right  leg: He has some subjective tenderness with palpation of his right posterior calf. I do not feel any cords, swelling or fluctuance. He has a normal gait. Affect: He does not appear depressed  Lab Results HIV 1 RNA Quant (copies/mL)  Date Value  03/23/2012 1415*  12/31/2011 1355*  08/30/2011 1848*     CD4 T Cell Abs (cmm)  Date Value  03/23/2012 680   06/25/2010 520   04/08/2010 1180      Assessment: His HIV infection remains asymptomatic and his CD4 count remains in the normal range. He is currently enrolled in the START study and is not on antiretroviral therapy. I will make sure that he follows up with our study coordinator, Deirdre Evener.  His depression appears to be better but I will try to arrange for him to see our mental health counselor.  I've encouraged him to consider quitting cigarettes and alcohol completely. We'll see if we can get him financial assistance to stay on his albuterol inhaler.  To not sure what is causing his calf pain. I will check a Doppler ultrasound to make sure that he does not have any evidence of venous thrombosis.    Plan: 1. Observe off of antiretroviral therapy 2. Encouraged abstinence from cigarettes and alcohol 3. Refer for mental health counseling and financial assistance counseling 4. Followup for START study   Cliffton Asters, MD [facility]  for Infectious Disease [facility] [phone] pager   [phone] cell 04/18/2012, 9:23 AM

## 2012-04-18 NOTE — Progress Notes (Signed)
Appointment for Doppler study for right lower extremity at [facility], Wed., Feb 5 @ 1030 to arrive at John [name] [facility] Admitting office at 1000.

## 2012-04-19 ENCOUNTER — Ambulatory Visit (HOSPITAL_COMMUNITY)
Admission: RE | Admit: 2012-04-19 | Discharge: 2012-04-19 | Disposition: A | Discharge status: Home / Self Care | Payer: Self-pay | Source: Ambulatory Visit | Attending: Internal Medicine | Admitting: Internal Medicine

## 2012-04-19 ENCOUNTER — Telehealth: Payer: Self-pay | Admitting: *Deleted

## 2012-04-19 ENCOUNTER — Other Ambulatory Visit: Payer: Self-pay | Admitting: *Deleted

## 2012-04-19 DIAGNOSIS — J45909 Unspecified asthma, uncomplicated: Secondary | ICD-10-CM

## 2012-04-19 DIAGNOSIS — M79669 Pain in unspecified lower leg: Secondary | ICD-10-CM

## 2012-04-19 DIAGNOSIS — M79609 Pain in unspecified limb: Secondary | ICD-10-CM

## 2012-04-19 MED ORDER — ALBUTEROL SULFATE HFA 108 (90 BASE) MCG/ACT IN AERS: 1.0000 | INHALATION_SPRAY | Freq: Four times a day (QID) | RESPIRATORY_TRACT | Status: DC | PRN

## 2012-04-19 NOTE — Telephone Encounter (Signed)
Pt completed application for PAP for ProAir 90 for 12 months.  RN will print rx for MD signature and place with application for MD signature.

## 2012-04-19 NOTE — Progress Notes (Addendum)
*  PRELIMINARY RESULTS* Vascular Ultrasound Right lower extremity venous duplex has been completed.  Preliminary findings: Right:  No evidence of DVT, superficial thrombosis, or Baker's cyst.  Results called to Dr. Orvan Falconer.   Farrel Demark, RDMS, RVT 04/19/2012, 8:54 AM

## 2012-04-19 NOTE — Telephone Encounter (Signed)
Message left requesting pt's aunt give the pt the message about his appt for 1000 this AM at the Admitting Office, [address].

## 2012-04-24 ENCOUNTER — Encounter (HOSPITAL_COMMUNITY): Payer: Self-pay | Admitting: Neurology

## 2012-04-24 ENCOUNTER — Observation Stay (HOSPITAL_COMMUNITY)
Admission: EM | Admit: 2012-04-24 | Discharge: 2012-04-26 | Disposition: A | Discharge status: Home / Self Care | Payer: Self-pay | Attending: Infectious Disease | Admitting: Infectious Disease

## 2012-04-24 ENCOUNTER — Emergency Department (HOSPITAL_COMMUNITY): Payer: Self-pay

## 2012-04-24 DIAGNOSIS — F411 Generalized anxiety disorder: Secondary | ICD-10-CM | POA: Insufficient documentation

## 2012-04-24 DIAGNOSIS — B20 Human immunodeficiency virus [HIV] disease: Secondary | ICD-10-CM | POA: Diagnosis present

## 2012-04-24 DIAGNOSIS — Z79899 Other long term (current) drug therapy: Secondary | ICD-10-CM | POA: Insufficient documentation

## 2012-04-24 DIAGNOSIS — R259 Unspecified abnormal involuntary movements: Secondary | ICD-10-CM | POA: Insufficient documentation

## 2012-04-24 DIAGNOSIS — F3289 Other specified depressive episodes: Secondary | ICD-10-CM | POA: Insufficient documentation

## 2012-04-24 DIAGNOSIS — Z590 Homelessness: Secondary | ICD-10-CM

## 2012-04-24 DIAGNOSIS — F17200 Nicotine dependence, unspecified, uncomplicated: Secondary | ICD-10-CM | POA: Insufficient documentation

## 2012-04-24 DIAGNOSIS — F17210 Nicotine dependence, cigarettes, uncomplicated: Secondary | ICD-10-CM | POA: Diagnosis present

## 2012-04-24 DIAGNOSIS — F1020 Alcohol dependence, uncomplicated: Secondary | ICD-10-CM | POA: Insufficient documentation

## 2012-04-24 DIAGNOSIS — J45901 Unspecified asthma with (acute) exacerbation: Principal | ICD-10-CM

## 2012-04-24 DIAGNOSIS — F1010 Alcohol abuse, uncomplicated: Secondary | ICD-10-CM | POA: Diagnosis present

## 2012-04-24 DIAGNOSIS — F329 Major depressive disorder, single episode, unspecified: Secondary | ICD-10-CM | POA: Insufficient documentation

## 2012-04-24 DIAGNOSIS — F10239 Alcohol dependence with withdrawal, unspecified: Secondary | ICD-10-CM | POA: Insufficient documentation

## 2012-04-24 DIAGNOSIS — J45909 Unspecified asthma, uncomplicated: Secondary | ICD-10-CM

## 2012-04-24 DIAGNOSIS — F10939 Alcohol use, unspecified with withdrawal, unspecified: Secondary | ICD-10-CM | POA: Insufficient documentation

## 2012-04-24 DIAGNOSIS — Z21 Asymptomatic human immunodeficiency virus [HIV] infection status: Secondary | ICD-10-CM | POA: Insufficient documentation

## 2012-04-24 LAB — CBC
MCH: 31.9 pg (ref 26.0–34.0)
MCHC: 34.5 g/dL (ref 30.0–36.0)
Platelets: 278 10*3/uL (ref 150–400)
RDW: 14.5 % (ref 11.5–15.5)

## 2012-04-24 LAB — HEPATIC FUNCTION PANEL
ALT: 28 U/L (ref 0–53)
AST: 30 U/L (ref 0–37)
Bilirubin, Direct: <0.1 mg/dL (ref 0.0–0.3)
Total Bilirubin: 0.4 mg/dL (ref 0.3–1.2)

## 2012-04-24 LAB — INFLUENZA PANEL BY PCR (TYPE A & B): H1N1 flu by pcr: NOT DETECTED

## 2012-04-24 LAB — POCT I-STAT, CHEM 8
Calcium, Ion: 1.14 mmol/L (ref 1.12–1.23)
Glucose, Bld: 172 mg/dL — ABNORMAL HIGH (ref 70–99)
HCT: 51 % (ref 39.0–52.0)
Hemoglobin: 17.3 g/dL — ABNORMAL HIGH (ref 13.0–17.0)
Potassium: 3.6 mEq/L (ref 3.5–5.1)
TCO2: 23 mmol/L (ref 0–100)

## 2012-04-24 MED ORDER — ACETAMINOPHEN 325 MG PO TABS: 650.0000 mg | ORAL_TABLET | Freq: Four times a day (QID) | ORAL | Status: DC | PRN

## 2012-04-24 MED ORDER — IPRATROPIUM BROMIDE 0.02 % IN SOLN
0.5000 mg | Freq: Four times a day (QID) | RESPIRATORY_TRACT | Status: DC
  Administered 2012-04-24 – 2012-04-26 (×5): 0.5 mg via RESPIRATORY_TRACT
  Filled 2012-04-24 (×5): qty 2.5

## 2012-04-24 MED ORDER — ALBUTEROL (5 MG/ML) CONTINUOUS INHALATION SOLN
INHALATION_SOLUTION | RESPIRATORY_TRACT | Status: AC
  Administered 2012-04-24: 15 mg via RESPIRATORY_TRACT
  Filled 2012-04-24: qty 20

## 2012-04-24 MED ORDER — MOMETASONE FURO-FORMOTEROL FUM 100-5 MCG/ACT IN AERO
2.0000 | INHALATION_SPRAY | Freq: Two times a day (BID) | RESPIRATORY_TRACT | Status: DC
  Administered 2012-04-24 – 2012-04-26 (×4): 2 via RESPIRATORY_TRACT
  Filled 2012-04-24: qty 8.8

## 2012-04-24 MED ORDER — ACETAMINOPHEN 650 MG RE SUPP: 650.0000 mg | Freq: Four times a day (QID) | RECTAL | Status: DC | PRN

## 2012-04-24 MED ORDER — ALBUTEROL SULFATE (5 MG/ML) 0.5% IN NEBU
2.5000 mg | INHALATION_SOLUTION | Freq: Four times a day (QID) | RESPIRATORY_TRACT | Status: DC
  Administered 2012-04-24 – 2012-04-26 (×5): 2.5 mg via RESPIRATORY_TRACT
  Filled 2012-04-24 (×5): qty 0.5

## 2012-04-24 MED ORDER — PREDNISONE 20 MG PO TABS
60.0000 mg | ORAL_TABLET | Freq: Once | ORAL | Status: AC
  Administered 2012-04-24: 60 mg via ORAL
  Filled 2012-04-24: qty 3

## 2012-04-24 MED ORDER — IPRATROPIUM BROMIDE 0.02 % IN SOLN
1.0000 mg | Freq: Once | RESPIRATORY_TRACT | Status: AC
  Administered 2012-04-24: 1 mg via RESPIRATORY_TRACT
  Filled 2012-04-24: qty 5

## 2012-04-24 MED ORDER — SODIUM CHLORIDE 0.9 % IJ SOLN: 3.0000 mL | INTRAMUSCULAR | Status: DC | PRN

## 2012-04-24 MED ORDER — ALBUTEROL SULFATE (5 MG/ML) 0.5% IN NEBU
15.0000 mg | INHALATION_SOLUTION | Freq: Once | RESPIRATORY_TRACT | Status: AC
  Administered 2012-04-24: 15 mg via RESPIRATORY_TRACT

## 2012-04-24 MED ORDER — SODIUM CHLORIDE 0.9 % IJ SOLN
3.0000 mL | Freq: Two times a day (BID) | INTRAMUSCULAR | Status: DC
  Administered 2012-04-24 – 2012-04-26 (×5): 3 mL via INTRAVENOUS

## 2012-04-24 MED ORDER — SODIUM CHLORIDE 0.9 % IV SOLN: 250.0000 mL | INTRAVENOUS | Status: DC | PRN

## 2012-04-24 MED ORDER — PREDNISONE 10 MG PO TABS
60.0000 mg | ORAL_TABLET | Freq: Every day | ORAL | Status: DC
  Administered 2012-04-25 – 2012-04-26 (×2): 60 mg via ORAL
  Filled 2012-04-24 (×3): qty 1

## 2012-04-24 MED ORDER — ENOXAPARIN SODIUM 40 MG/0.4ML ~~LOC~~ SOLN
40.0000 mg | SUBCUTANEOUS | Status: DC
  Administered 2012-04-24 – 2012-04-25 (×2): 40 mg via SUBCUTANEOUS
  Filled 2012-04-24 (×3): qty 0.4

## 2012-04-24 NOTE — Progress Notes (Signed)
Pt admitted to the unit at 1538. Pt mental status is alert & oriented x 4. Pt oriented to room, staff, and call bell. Skin is intact. Full assessment charted in [facility]. Call bell within reach. Visitor guidelines reviewed w/ pt and/or family. Pt is ambulatory, independent. Lives with his aunt.

## 2012-04-24 NOTE — ED Notes (Signed)
Patient has arrived in pod c to await bed assignment

## 2012-04-24 NOTE — Progress Notes (Signed)
Pt complains of cramping in both feet since this morning.

## 2012-04-24 NOTE — ED Notes (Signed)
PATIENT UPDATED ON HIS MEAL AND HIS BED REQUEST.

## 2012-04-24 NOTE — H&P (Signed)
Conroy Goracke is an 42 y.o. male. HPI  Mr. Platts is a 42 y.o. African American male with h/o asthma and HIV presents with increasing dyspnea, wheezing, and cough. Patient has ran out of "inhaler medicine" on Thursday night and noticed worsening dyspnea and wheezing on Friday. He called EMS, and they came to give him inhaler which helped with his breathing temporarily. By Saturday, patient felt that his dyspnea and wheezing became worse. He noted chest tightness, cough, and dizziness along with extreme dyspnea. Sitting still alleviates the wheezing, but not the dyspnea, and inhalers improves all of his symptoms, and nebulizer treatments helps as well. He last came to ED with asthma exacerbation on 04/08/2012, but was considered stable and was sent home with inhaler. Patient has been diagnosed with asthma in the early 90's, and symptoms have worsened with time. He currently needs his inhaler 5 times a day, and at least once a night per week. He was prescribed oral prednisone around two months ago, and have ran out of the medication. He vaguely remembers using inhaled steroids, but says that he no longer uses them. He has never been hospitalized for asthma prior to today, but feel like that he would be at the hospital every week if he didn't have his inhaler.  He has never been intubated. His triggers are dust, cold weather, and exertion. He smokes 1-0.5 pack every 3-4 days, but used to smoke 1 pack/day. He reports having productive cough with clear to gray sputum, but no fever, chills, or incidence of flu lately. Patient denies having any chest pain or palpitations.  PMH Current diagnoses: -Asthma: dx 90's, see above for course of disease -HIV: dx 2012, no complications, no meds, enrolled in START study, CD4 count: 680 (03/23/2012), viral count: 1415 (03/23/2012)    Obtained from past ED notes: -Mental disorder: anxiety, depression  Past surgical history: -Leg surgery as a child  Past  hospitalizations: -08/11/2011: [facility] health  Allergies: -Shellfish allergies  Medications: -Albuterol inhaler  FHx Mom: died in late 11's, cause unknown, but had cirrhosis associated with alcohol Dad: patient does not know 2 older brothers: alcohol use, don't know about their health Denies heart disease and diabetes in family  SOCIAL HISTORY:  Tobacco: 0.5-1pack/3-4 days, used to smoke 1 pack/day Alcohol: 6-7 cups every day, often blacks out, in pre-contemplative stage Drugs: marijuana, 2 joints every week; Cocaine detected in UDS in December 2013. Patient currently lives in [place] with aunt. He does not have a job or transportation. He has a great of trouble obtaining finances for his medications. He only has food stamps and is interested in applying for medicaid. Patient is currently sexually active and uses condoms.   Review of Systems  Constitutional: Negative for fever and chills.  HENT: Negative for hearing loss.   Eyes: Negative for blurred vision and double vision.  Respiratory:       See HPI.  Cardiovascular: Negative for chest pain, palpitations and leg swelling.  Gastrointestinal: Negative for heartburn, nausea, vomiting, abdominal pain, diarrhea and constipation.  Genitourinary: Negative for dysuria, urgency and frequency.  Musculoskeletal:       Muscle cramp on bottom of feet that are brief and goes away.  Skin: Negative for rash.  Neurological: Negative for dizziness, tingling, sensory change and headaches.    Blood pressure 107/66, pulse 106, temperature 97.5 F (36.4 C), temperature source Oral, resp. rate 16, SpO2 97.00%. Physical Exam  Constitutional: He is oriented to person, place, and time. He appears  well-developed. No distress.  HENT:  Head: Normocephalic.  Left Ear: Decreased hearing is noted.  Nose: Nose normal. No rhinorrhea.  Mouth/Throat: Oropharynx is clear and moist.  Eyes: Conjunctivae and EOM are normal. Pupils are  equal, round, and reactive to light. No scleral icterus.  Neck: Full passive range of motion without pain. No mass and no thyromegaly present.  Cardiovascular: Normal rate, regular rhythm and normal heart sounds.  Exam reveals no gallop and no friction rub.   No murmur heard. Pulses:      Radial pulses are 2+ on the right side, and 2+ on the left side.       Posterior tibial pulses are 2+ on the right side, and 2+ on the left side.  Respiratory: Effort normal and breath sounds normal. No respiratory distress. He has no wheezes. He has no rhonchi. He has no rales.  GI: Soft. Normal appearance and bowel sounds are normal. He exhibits no distension. There is no hepatomegaly. There is no tenderness. There is no rebound, no guarding and no CVA tenderness.  Musculoskeletal:       Right upper arm: Normal.       Left upper arm: Normal.       Right upper leg: Normal.       Left upper leg: Normal.  Patient complained of cramping pain along the tendon for 4th tarsal on both feet.  Lymphadenopathy:    He has no cervical adenopathy.  Neurological: He is alert and oriented to person, place, and time. He has normal strength. No cranial nerve deficit or sensory deficit.  Skin: Skin is warm and dry. No rash noted. He is not diaphoretic. No erythema.  Psychiatric: He has a normal mood and affect. His speech is normal and behavior is normal. Thought content normal.   LABS BMET    Component Value Date/Time   NA 140 04/24/2012 1102   K 3.6 04/24/2012 1102   CL 102 04/24/2012 1102   CO2 20 04/08/2012 1648   GLUCOSE 172* 04/24/2012 1102   BUN 12 04/24/2012 1102   CREATININE 1.00 04/24/2012 1102   CREATININE 0.93 08/30/2011 0959   CALCIUM 9.4 04/08/2012 1648   GFRNONAA >90 04/08/2012 1648   GFRAA >90 04/08/2012 1648   Other: -Hgb: 17.3 -Hct: 51 -Glucose: 172 -Flu PCR: negative  Imaging: 1. CXR: Findings: Peribronchial thickening may represent reactive airway changes and / or bronchitis.  Prominence  mediastinal structures unchanged.  No segmental infiltrate, frank pulmonary edema or gross  pneumothorax.  Heart size within normal limits.  IMPRESSION:  Peribronchial thickening may represent reactive airway changes and / or bronchitis.  No segmental consolidation.  Central pulmonary vascular prominence without pulmonary edema.  Assessment/Plan Mr. Vora is a 42 y.o. African American man with h/o HIV and asthma who presents with shortness of breath, wheezing, and cough.   1. Shortness of breath, wheezing and cough Patient most likely have an episode of asthma exacerbation in the setting of asthma diagnosis. The onset of these symptoms were associated with cessation of inhaler medication. His PEF is near 50% of what it should be after treatment, which indicates that he has not had complete response to treatment and needs to be monitored. However, patient does seem to have only a mild episode because he can talk and have spO2 of  97% on RA. Other possible diagnoses include COPD because patient smokes and have productive cough. However, alleviation with inhaled albuterol and ipratropium indicates that this is a reversible event, more likely  with asthma than with COPD. He could also have drug induced shortness of breath given his history of cocaine use.  -Monitor spO2 -Monitor PEF -Start patient on inhaled albuterol and ipratropium for acute symptoms -Start predisone PO  -Start inhaled corticosteroids for long term management -Consult SW and case management for medicaid application for this patient  2. HIV -Currently enrolled in START trial, will refer patient to Emergency planning/management officer for HIV control.  FEN/GI -Regular diet -Replace electrolytes as necessary -GI: omeprazole PRN  Proph: -DVT: lovenox  Rhea Pink 04/24/2012, 2:33 PM

## 2012-04-24 NOTE — ED Provider Notes (Signed)
History     CSN: [id number]  Arrival date & time 04/24/12  0845   First MD Initiated Contact with Patient 04/24/12 0848      Chief Complaint  Patient presents with  . Wheezing  . Shortness of Breath     HPI Pt was seen at 0850.   Per pt, c/o gradual onset and worsening of persistent cough, wheezing and SOB for the past 3 days.  Describes his symptoms as "my asthma is acting up."  Had been using home MDI and nebs without relief, and has now run out of both.  Denies CP/palpitations, no back pain, no abd pain, no N/V/D, no fevers, no rash.     Past Medical History  Diagnosis Date  . Asthma   . HIV (human immunodeficiency virus infection)   . Mental disorder   . Anxiety   . Depression     History reviewed. No pertinent past surgical history.   History  Substance Use Topics  . Smoking status: Current Every Day Smoker -- 1.00 packs/day    Types: Cigarettes  . Smokeless tobacco: Never Used  . Alcohol Use: 0.0 oz/week     Comment: drinking 1.5 fifths of vodka, beer 12-pack /day      Review of Systems ROS: Statement: All systems negative except as marked or noted in the HPI; Constitutional: Negative for fever and chills. ; ; Eyes: Negative for eye pain, redness and discharge. ; ; ENMT: Negative for ear pain, hoarseness, nasal congestion, sinus pressure and sore throat. ; ; Cardiovascular: Negative for chest pain, palpitations, diaphoresis, and peripheral edema. ; ; Respiratory: +SOB, cough, wheezing. Negative for stridor. ; ; Gastrointestinal: Negative for nausea, vomiting, diarrhea, abdominal pain, blood in stool, hematemesis, jaundice and rectal bleeding. . ; ; Genitourinary: Negative for dysuria, flank pain and hematuria. ; ; Musculoskeletal: Negative for back pain and neck pain. Negative for swelling and trauma.; ; Skin: Negative for pruritus, rash, abrasions, blisters, bruising and skin lesion.; ; Neuro: Negative for headache, lightheadedness and neck stiffness. Negative for  weakness, altered level of consciousness , altered mental status, extremity weakness, paresthesias, involuntary movement, seizure and syncope.      Allergies  Shellfish allergy  Home Medications   Current Outpatient Rx  Name  Route  Sig  Dispense  Refill  . albuterol (PROVENTIL HFA;VENTOLIN HFA) 108 (90 BASE) MCG/ACT inhaler   Inhalation   Inhale 2 puffs into the lungs every 6 (six) hours as needed for wheezing.           BP 135/75  Pulse 115  Temp(Src) 97.5 F (36.4 C) (Oral)  Resp 20  SpO2 97%  Physical Exam 0855: Physical examination:  Nursing notes reviewed; Vital signs and O2 SAT reviewed;  Constitutional: Well developed, Well nourished, Well hydrated, Uncomfortable appearing; Head:  Normocephalic, atraumatic; Eyes: EOMI, PERRL, No scleral icterus; ENMT: Mouth and pharynx normal, Mucous membranes moist; Neck: Supple, Full range of motion, No lymphadenopathy; Cardiovascular: Regular rate and rhythm, No gallop; Respiratory: Breath sounds coarse & equal bilaterally, scattered wheezes bilat with audible wheezing.  Speaking in phrases. Sitting upright, +access mm use.; Chest: Nontender, Movement normal; Abdomen: Soft, Nontender, Nondistended, Normal bowel sounds; Genitourinary: No CVA tenderness; Extremities: Pulses normal, No tenderness, No edema, No calf edema or asymmetry.; Neuro: AA&Ox3, Major CN grossly intact.  Speech clear. No gross focal motor or sensory deficits in extremities.; Skin: Color normal, Warm, Dry.   ED Course  Procedures     MDM  MDM Reviewed: nursing note, vitals  and previous chart Reviewed previous: labs Interpretation: labs and x-ray Total time providing critical care: 30-74 minutes. This excludes time spent performing separately reportable procedures and services. Consults: admitting MD   CRITICAL CARE Performed by: Laray Anger Total critical care time: 35 Critical care time was exclusive of separately billable procedures and treating  other patients. Critical care was necessary to treat or prevent imminent or life-threatening deterioration. Critical care was time spent personally by me on the following activities: development of treatment plan with patient and/or surrogate as well as nursing, discussions with consultants, evaluation of patient's response to treatment, examination of patient, obtaining history from patient or surrogate, ordering and performing treatments and interventions, ordering and review of laboratory studies, ordering and review of radiographic studies, pulse oximetry and re-evaluation of patient's condition.   Dg Chest Port 1 View 04/24/2012  *RADIOLOGY REPORT*  Clinical Data: Cough. Shortness of breath.  Asthma.  PORTABLE CHEST - 1 VIEW  Comparison: 04/09/2012 CT.  04/08/2012 chest x-ray.  Findings: Peribronchial thickening may represent reactive airway changes and / or bronchitis.  Prominence mediastinal structures unchanged.  No segmental infiltrate, frank pulmonary edema or gross pneumothorax.  Heart size within normal limits.  IMPRESSION: Peribronchial thickening may represent reactive airway changes and / or bronchitis.  No segmental consolidation.  Central pulmonary vascular prominence without pulmonary edema.   Original Report Authenticated By: Lacy Duverney, M.D.     Results for orders placed during the hospital encounter of 04/24/12  POCT I-STAT, CHEM 8      Result Value Range   Sodium 140  135 - 145 mEq/L   Potassium 3.6  3.5 - 5.1 mEq/L   Chloride 102  96 - 112 mEq/L   BUN 12  6 - 23 mg/dL   Creatinine, Ser 1.61  0.50 - 1.35 mg/dL   Glucose, Bld 096 (*) 70 - 99 mg/dL   Calcium, Ion 0.45  4.09 - 1.23 mmol/L   TCO2 23  0 - 100 mmol/L   Hemoglobin 17.3 (*) 13.0 - 17.0 g/dL   HCT 81.1  91.4 - 78.2 %    03/23/12 CD4 count 680    1100:  Continuous neb and steroids given.  Sats only increased to 94% R/A at rest, dropped to 90% R/A with ambulation. Lungs continue coarse with scattered wheezes.   Will admit.  T/C to [facility] Resident, case discussed, including:  HPI, pertinent PM/SHx, VS/PE, dx testing, ED course and treatment:  Agreeable to come to ED for eval to admit.      Laray Anger, DO 04/26/12 2000

## 2012-04-24 NOTE — H&P (Signed)
Hospital Admission Note Date: 04/24/2012  Patient name: John [name] Medical record number: [id number] Date of birth: 1970-12-07 Age: 42 y.o. Gender: male PCP: Default, Provider, MD  Medical Service: IMTS  Attending physician:  Dr. Daiva Eves   Internal Medicine Teaching Service Contact Information  Weekday Hours (7AM-5PM):  1st Contact: Dr. Burtis Junes  Pager:[phone] 2nd Contact: Dr. Dierdre Searles Pager:[phone] ** If no return call within 15 minutes (after trying both pagers listed below), please call after hours pagers.  After 5 pm or weekends: 1st Contact: Pager: [phone] 2nd Contact: Pager: [phone]   Chief Complaint: SOB  History of Present Illness: John [name] 42 yo pmh of asthma, HIV recent 1415 viral load and 680 CD4 1/14 presented with SOB. Pt uses albuterol inhaler for an average of 5 times daily and borrows a girlfriends nebulizer treatment on occasion "when it gets bad." Pt has not used any other inhalers and requires about 1x/month presentations to the ED for symptom management. Pt has been on prednisone for short periods of time. Most recent on 04/07/12 where CTA was also done and negative for PE but showed some peribronchial thickening. Pt denied any cough, fever/chills, myalgias, sick contacts, HA, n/v/d/c, CP, or palpitations. Pt didn't have any LOC or presyncopal episodes. Pt does currently smoke daily along but this has decreased from 2ppd to 0.5 ppd with marijuana occasionally. Pt has not had any weight loss, recent travel, prior hospitalizations or intubations for asthma exacerbations. Pt triggers are dust, cold weather, and change in seasons.   Meds: Medications Prior to Admission  Medication Sig Dispense Refill  . albuterol (PROVENTIL HFA;VENTOLIN HFA) 108 (90 BASE) MCG/ACT inhaler Inhale 2 puffs into the lungs every 6 (six) hours as needed for wheezing.        Allergies: Allergies as of 04/24/2012 - Review Complete 04/24/2012  Allergen Reaction Noted  . Shellfish allergy  Anaphylaxis and Swelling 03/14/2011   Past Medical History  Diagnosis Date  . Asthma   . HIV (human immunodeficiency virus infection)   . Mental disorder   . Anxiety   . Depression    History reviewed. No pertinent past surgical history. No family history on file. History   Social History  . Marital Status: Single    Spouse Name: N/A    Number of Children: N/A  . Years of Education: N/A   Occupational History  . Not on file.   Social History Main Topics  . Smoking status: Current Every Day Smoker -- 1.00 packs/day    Types: Cigarettes  . Smokeless tobacco: Never Used  . Alcohol Use: 0.0 oz/week     Comment: drinking 1.5 fifths of vodka, beer 12-pack /day  . Drug Use: 7.00 per week    Special: Marijuana  . Sexually Active: Not Currently     Comment: pt. given condoms   Other Topics Concern  . Not on file   Social History Narrative  . No narrative on file    Review of Systems: Pertinent items are noted in HPI.  Physical Exam: Blood pressure 117/75, pulse 78, temperature 98.6 F (37 C), temperature source Oral, resp. rate 18, height 5\' 6"  (1.676 m), weight 174 lb 4.8 oz (79.062 kg), SpO2 94.00%. General: sitting in bed, NAD, speaking in clear sentences HEENT: PERRL, EOMI, no scleral icterus Cardiac: RRR, no rubs, murmurs or gallops Pulm:  slightly decreased BS in bases bilaterally, peak flow 350 (average 3 attempts), slight expiratory wheezes, moving normal volumes of air Abd: soft, nontender, nondistended, BS present Ext: warm  and well perfused, no pedal edema Neuro: alert and oriented X3, cranial nerves II-XII grossly intact  Lab results: Basic Metabolic Panel:  Recent Labs  40/98/11 1102  NA 140  K 3.6  CL 102  GLUCOSE 172*  BUN 12  CREATININE 1.00   CBC:  Recent Labs  04/24/12 1102  HGB 17.3*  HCT 51.0   Urine Drug Screen: Drugs of Abuse     Component Value Date/Time   LABOPIA NONE DETECTED 03/09/2012 0331   COCAINSCRNUR POSITIVE*  03/09/2012 0331   LABBENZ NONE DETECTED 03/09/2012 0331   AMPHETMU NONE DETECTED 03/09/2012 0331   THCU POSITIVE* 03/09/2012 0331   LABBARB NONE DETECTED 03/09/2012 0331    Imaging results:  Dg Chest Port 1 View  04/24/2012  *RADIOLOGY REPORT*  Clinical Data: Cough. Shortness of breath.  Asthma.  PORTABLE CHEST - 1 VIEW  Comparison: 04/09/2012 CT.  04/08/2012 chest x-ray.  Findings: Peribronchial thickening may represent reactive airway changes and / or bronchitis.  Prominence mediastinal structures unchanged.  No segmental infiltrate, frank pulmonary edema or gross pneumothorax.  Heart size within normal limits.  IMPRESSION: Peribronchial thickening may represent reactive airway changes and / or bronchitis.  No segmental consolidation.  Central pulmonary vascular prominence without pulmonary edema.   Original Report Authenticated By: Lacy Duverney, M.D.    Assessment & Plan by Problem: John [name] pmh HIV and asthma with frequent exacerbations p/w SOB.   1. Asthma exacerbation: pt had improvement with nebs in ED but some desats on ambulation. Some slight wheezes and decreased peak flow per pt.  -cont duonebs -CXR -UDS -flu panel -prednisone 60mg   -consider optimizing asthma management with ICS inhaler and close f/u as outpatient given high frequency of rescue inhaler use  Dispo: Disposition is deferred at this time, awaiting improvement of current medical problems. Anticipated discharge in approximately 1 day(s).   The patient does not have a current PCP (Default, Provider, MD), therefore will not be requiring [facility]PC follow-up after discharge.   The patient does not have transportation limitations that hinder transportation to clinic appointments.  SignedChristen Bame 04/24/2012, 4:14 PM  [phone]

## 2012-04-24 NOTE — ED Notes (Signed)
Pt stating wheezing, sob since Friday. States has been out of nebulizer and inhaler. Pt audible wheezing, 99% RA. Speaking in clear, concise sentences.

## 2012-04-24 NOTE — ED Notes (Signed)
Respiratory at bedside.

## 2012-04-24 NOTE — ED Notes (Signed)
Pt ambulated without assistance. Oxygen saturations dropped to 90%.

## 2012-04-24 NOTE — ED Notes (Signed)
Called respiratory for neb treatment

## 2012-04-25 LAB — CBC
HCT: 42.6 % (ref 39.0–52.0)
Hemoglobin: 14 g/dL (ref 13.0–17.0)
MCH: 30.6 pg (ref 26.0–34.0)
MCHC: 32.9 g/dL (ref 30.0–36.0)

## 2012-04-25 LAB — BASIC METABOLIC PANEL
BUN: 14 mg/dL (ref 6–23)
Chloride: 105 mEq/L (ref 96–112)
GFR calc Af Amer: >90 mL/min (ref >90)
Glucose, Bld: 124 mg/dL — ABNORMAL HIGH (ref 70–99)
Potassium: 3.5 mEq/L (ref 3.5–5.1)

## 2012-04-25 LAB — RAPID URINE DRUG SCREEN, HOSP PERFORMED: Opiates: NOT DETECTED

## 2012-04-25 MED ORDER — ALBUTEROL SULFATE HFA 108 (90 BASE) MCG/ACT IN AERS: 1.0000 | INHALATION_SPRAY | Freq: Four times a day (QID) | RESPIRATORY_TRACT | Status: DC | PRN

## 2012-04-25 MED ORDER — FLUTICASONE-SALMETEROL 100-50 MCG/DOSE IN AEPB: 1.0000 | INHALATION_SPRAY | Freq: Every day | RESPIRATORY_TRACT | Status: DC

## 2012-04-25 MED ORDER — LORAZEPAM 1 MG PO TABS
1.0000 mg | ORAL_TABLET | Freq: Once | ORAL | Status: AC
  Administered 2012-04-25: 1 mg via ORAL
  Filled 2012-04-25: qty 1

## 2012-04-25 MED ORDER — PREDNISONE 20 MG PO TABS: 60.0000 mg | ORAL_TABLET | Freq: Every day | ORAL | Status: DC

## 2012-04-25 MED ORDER — LORAZEPAM 1 MG PO TABS
1.0000 mg | ORAL_TABLET | Freq: Four times a day (QID) | ORAL | Status: DC | PRN
  Administered 2012-04-25: 1 mg via ORAL
  Filled 2012-04-25: qty 1

## 2012-04-25 MED ORDER — LORAZEPAM 2 MG/ML IJ SOLN: 1.0000 mg | Freq: Four times a day (QID) | INTRAMUSCULAR | Status: DC | PRN

## 2012-04-25 NOTE — Progress Notes (Signed)
Medical Student Daily Progress Note  Subjective: Patient did not have another episode of asthma exacerbation last night. He noted wheezing prior to his breathing treatment, but did not notice it after receiving treatment.  ROS: CV: no chest pain, no palpitation PULM: no dyspnea, some cough  Objective: Vital signs in last 24 hours: Filed Vitals:   04/24/12 2100 04/24/12 2208 04/24/12 2209 04/25/12 0541  BP: 122/79   126/76  Pulse: 81   68  Temp: 98.1 F (36.7 C)   98.3 F (36.8 C)  TempSrc: Oral   Oral  Resp: 16   16  Height:      Weight:    78.3 kg (172 lb 9.9 oz)  SpO2: 96% 94% 94% 99%   Weight change:   Intake/Output Summary (Last 24 hours) at 04/25/12 1610 Last data filed at 04/24/12 1850  Gross per 24 hour  Intake    240 ml  Output      0 ml  Net    240 ml   Physical Exam: BP 126/76  Pulse 68  Temp(Src) 98.3 F (36.8 C) (Oral)  Resp 16  Ht 5\' 6"  (1.676 m)  Wt 78.3 kg (172 lb 9.9 oz)  BMI 27.87 kg/m2  SpO2 99%  General Appearance:    Alert, cooperative, no distress, appears stated age  Head:    Normocephalic, without obvious abnormality, atraumatic  Eyes:    PERRL, conjunctiva/corneas clear, EOM's intact, both eyes    Ears:    Decreased hearing in left ear  Nose:   Nares normal, septum midline, mucosa normal, no drainage    or sinus tenderness  Throat:   Lips, mucosa, and tongue normal; teeth has yellowing, and several molars are missing,  gums normal  Neck:   Supple, symmetrical, trachea midline, no adenopathy;       thyroid:  No enlargement/tenderness/nodules; no carotid   bruit or JVD  Back:     Symmetric, no curvature, ROM normal, no CVA tenderness  Lungs:     Expiratory wheezes diffusely throughout bilateral lungs, mild WOB, able to talk without dyspnea  Chest wall:    No tenderness or deformity  Heart:    Regular rate and rhythm, S1 and S2 normal, no murmur, rub   or gallop  Abdomen:     Soft, non-tender, bowel sounds active all four quadrants,    no  masses, no organomegaly  Extremities:   Extremities normal, atraumatic, no cyanosis or edema  Pulses:   2+ and symmetric all extremities  Skin:   Skin color, texture, turgor normal, no rashes or lesions  Lymph nodes:   Cervical, supraclavicular, and axillary nodes normal  Neurologic:   CNII-XII intact. Normal strength, sensation and reflexes      throughout   Lab Results: BMET    Component Value Date/Time   NA 142 04/25/2012 0555   K 3.5 04/25/2012 0555   CL 105 04/25/2012 0555   CO2 24 04/25/2012 0555   GLUCOSE 124* 04/25/2012 0555   BUN 14 04/25/2012 0555   CREATININE 0.85 04/25/2012 0555   CREATININE 0.93 08/30/2011 0959   CALCIUM 9.1 04/25/2012 0555   GFRNONAA >90 04/25/2012 0555   GFRAA >90 04/25/2012 0555    CBC    Component Value Date/Time   WBC 8.1 04/25/2012 0555   RBC 4.58 04/25/2012 0555   HGB 14.0 04/25/2012 0555   HCT 42.6 04/25/2012 0555   PLT 253 04/25/2012 0555   MCV 93.0 04/25/2012 0555   MCH 30.6 04/25/2012  0555   MCHC 32.9 04/25/2012 0555   RDW 14.8 04/25/2012 0555   LYMPHSABS 3.1 04/08/2012 1648   MONOABS 0.6 04/08/2012 1648   EOSABS 0.2 04/08/2012 1648   BASOSABS 0.0 04/08/2012 1648   Drugs of Abuse     Component Value Date/Time   LABOPIA NONE DETECTED 04/25/2012 0228   COCAINSCRNUR NONE DETECTED 04/25/2012 0228   LABBENZ NONE DETECTED 04/25/2012 0228   AMPHETMU NONE DETECTED 04/25/2012 0228   THCU POSITIVE* 04/25/2012 0228   LABBARB NONE DETECTED 04/25/2012 0228     Micro Results: No results found for this or any previous visit (from the past 240 hour(s)). Studies/Results: Dg Chest Port 1 View  04/24/2012  *RADIOLOGY REPORT*  Clinical Data: Cough. Shortness of breath.  Asthma.  PORTABLE CHEST - 1 VIEW  Comparison: 04/09/2012 CT.  04/08/2012 chest x-ray.  Findings: Peribronchial thickening may represent reactive airway changes and / or bronchitis.  Prominence mediastinal structures unchanged.  No segmental infiltrate, frank pulmonary edema or gross pneumothorax.  Heart  size within normal limits.  IMPRESSION: Peribronchial thickening may represent reactive airway changes and / or bronchitis.  No segmental consolidation.  Central pulmonary vascular prominence without pulmonary edema.   Original Report Authenticated By: Lacy Duverney, M.D.    Medications: I have reviewed the patient's current medications. Scheduled Meds: . albuterol  2.5 mg Nebulization Q6H  . enoxaparin (LOVENOX) injection  40 mg Subcutaneous Q24H  . ipratropium  0.5 mg Nebulization Q6H  . mometasone-formoterol  2 puff Inhalation BID  . predniSONE  60 mg Oral Q breakfast  . sodium chloride  3 mL Intravenous Q12H   Continuous Infusions:  PRN Meds:.sodium chloride, acetaminophen, acetaminophen, sodium chloride Assessment/Plan: Active Problems:   HIV INFECTION   Alcohol abuse   Cigarette smoker   Asthma exacerbation  John [name] is a 42 yo [place] American man with h/o HIV and asthma who presents with shortness of breath, wheezing, and cough.  1. Shortness of breath, wheezing, and cough Patient's asthma exacerbation appears to be under control with good spO2 on room air, and patient did not wake up at night due to respiratory symptoms.  -Monitor PEF  -Continue patient on inhaled albuterol and ipratropium for acute symptoms  -Continue predisone PO  -Consider inhaled corticosteroids for long term management on discharge -Consult SW and case management for medicaid application for this patient  2. HIV  -Currently enrolled in START trial, will refer patient to Emergency planning/management officer for HIV control.   FEN/GI  -Regular diet  -Replace electrolytes as necessary  -GI: omeprazole PRN   Proph:  -DVT: lovenox  Dispo: -Patient is ready for discharge since his symptoms are well controlled.    LOS: 1 day   This is a Psychologist, occupational Note.  The care of the patient was discussed with Dr. Daiva Eves and Dr. Burtis Junes and the assessment and plan formulated with their assistance.  Please see their attached  note for official documentation of the daily encounter.  Rhea Pink 04/25/2012, 6:55 AM

## 2012-04-25 NOTE — Progress Notes (Signed)
Brief Nutrition Note:  RD pulled to pt for malnutrition screening tool score of 3, pt indicated unsure if he has lost weight, and has been eating poorly. Chart review shows weight loss is not significant (8 lbs, 4.4% body weight in 4 months). Pt currently on regular diet and ate 100% of one meal recorded.  Wt Readings from Last 5 Encounters:  04/25/12 172 lb 9.9 oz (78.3 kg)  04/18/12 175 lb (79.379 kg)  01/26/12 150 lb (68.04 kg)  01/20/12 179 lb 8 oz (81.421 kg)  01/05/12 180 lb (81.647 kg)    Body mass index is 27.87 kg/(m^2). overweight    No nutrition interventions warranted at this time. Please consult as needed.    Clarene Duke RD, LDN Pager [phone] After Hours pager [phone]

## 2012-04-25 NOTE — H&P (Signed)
Resident Addendum to Medical Student Note   I have seen and examined the patient, and agree with the the medical student assessment and plan outlined above. Please see my brief note for additional details.  Length of Stay: 1  John [name] 04/25/2012, 11:11 AM

## 2012-04-25 NOTE — Progress Notes (Signed)
1230 Patient ambulatory in hall with student nurse when patient suddenly felt faint and dizzy also slightly SOB and extreme tremors. B/P 109/77 -67-18 Temp 98.2 oxygen saturations 99%.

## 2012-04-25 NOTE — Clinical Social Work Psychosocial (Signed)
Clinical Social Work Department BRIEF PSYCHOSOCIAL ASSESSMENT 04/25/2012  Patient:  Andy,Hilberto     Account Number:  [id number]     Admit date:  04/24/2012  Clinical Social Worker:  Johnsie Cancel  Date/Time:  04/25/2012 10:09 AM  Referred by:  Physician  Date Referred:  04/25/2012 Referred for  Other - See comment   Other Referral:   Medicaid Application Assistance  Medication Assistance   Interview type:  Other - See comment Other interview type:   Physician    PSYCHOSOCIAL DATA Living Status:  ALONE Admitted from facility:   Level of care:   Primary support name:  Andi Ellerbe Primary support relationship to patient:  FAMILY Degree of support available:   Adequate.    CURRENT CONCERNS Current Concerns  Financial Resources  Other - See comment   Other Concerns:   Medication assistance.    SOCIAL WORK ASSESSMENT / PLAN CSW consulted re: medication and medicaid assistance. CSW spoke to med student re: above issues. CSW informed med student this provider would contact financial counselor, Merry Proud to inform her of patient needing medicaid. CSW spoke to [place] who stated she would come up to assist in application today.CSW referred RNCM to the case due to needing albuterol inhalers. RNCM will follow up with the patient surrounding medication assistance.   Assessment/plan status:  Information/Referral to [facility] Other assessment/ plan:   Information/referral to community resources:    PATIENT'S/FAMILY'S RESPONSE TO PLAN OF CARE: Med student thanked CSW for assisting in refferals for patient.   Lia Foyer, LCSWA [facility] Clinical Social Worker Contact #: [phone]

## 2012-04-25 NOTE — Discharge Summary (Signed)
Internal Medicine Teaching [facility] Discharge Note  Name: John [name] MRN: [id number] DOB: 1970/11/26 42 y.o.  Date of Admission: 04/24/2012  8:46 AM Date of Discharge: 04/25/2012 Attending Physician: Randall Hiss, MD  Discharge Diagnosis: Active Problems:   HIV INFECTION   Alcohol abuse   Cigarette smoker   Asthma exacerbation   Discharge Medications:   Medication List    TAKE these medications       albuterol 108 (90 BASE) MCG/ACT inhaler  Commonly known as:  PROVENTIL HFA;VENTOLIN HFA  Inhale 2 puffs into the lungs every 6 (six) hours as needed for wheezing.     albuterol 108 (90 BASE) MCG/ACT inhaler  Commonly known as:  PROVENTIL HFA;VENTOLIN HFA  Inhale 1-2 puffs into the lungs every 6 (six) hours as needed for wheezing.     Fluticasone-Salmeterol 100-50 MCG/DOSE Aepb  Commonly known as:  ADVAIR DISKUS  Inhale 1 puff into the lungs daily.     predniSONE 20 MG tablet  Commonly known as:  DELTASONE  Take 3 tablets (60 mg total) by mouth daily with breakfast. Take 60 today, then 40 tablets 04/26/12 and then 1 tablet until finished        Disposition and follow-up:   John [name] was discharged from [facility] in stable condition.  At the hospital follow up visit please address asthma management added singular inhaler.   Follow-up Appointments:   Future Appointments Provider Department Dept Phone   04/27/2012 2:30 PM Roderic Scarce, [facility] for Infectious Disease [phone]   05/03/2012 11:00 AM [facility] for Infectious Disease [phone]      Consultations:    Procedures Performed:  Dg Chest 2 View  04/08/2012  *RADIOLOGY REPORT*  Clinical Data: Asthma.  HIV.  Chest tightness. Shortness of breath.  Prior smoker.  CHEST - 2 VIEW  Comparison: 03/14/2012.  Findings: Mild chronic increased lung markings relatively stable to the prior exam.  Question of nodular  density projecting between the anterior aspect of the left fifth and sixth rib possibly nipple shadow.  Repeat PA view with nipple marker placed may be considered to exclude underlying nodule.  Apical pleural thickening on the right unchanged without associate bony destruction.  No pneumothorax or congestive heart failure.  Heart size within normal limits.  IMPRESSION: Question prominent nipple shadow on the left.  Repeat PA view with nipple marker placed recommended.  Chronic lung markings without evidence of segmental infiltrate, congestive heart failure or pneumothorax.   Original Report Authenticated By: Lacy Duverney, M.D.    Ct Angio Chest W/cm &/or Wo Cm  04/09/2012  *RADIOLOGY REPORT*  Clinical Data: Difficulty breathing; history of asthma.  CT ANGIOGRAPHY CHEST  Technique:  Multidetector CT imaging of the chest using the standard protocol during bolus administration of intravenous contrast. Multiplanar reconstructed images including MIPs were obtained and reviewed to evaluate the vascular anatomy.  Contrast: OMNIPAQUE IOHEXOL 350 MG/ML SOLN  Comparison: Chest radiograph performed 04/08/2012, and CT of the chest performed 05/22/2007  Findings: There is no evidence of pulmonary embolus.  Mild bibasilar atelectasis is noted.  Diffuse peribronchial thickening is again noted, more prominent on the right.  This is thought to reflect the patient's underlying reactive airways disease.  It appears slightly more prominent than in 2009.  There is no evidence of significant focal consolidation, pleural effusion or pneumothorax.  No masses are identified; no abnormal focal contrast enhancement is seen.  The mediastinum is unremarkable  in appearance.  No mediastinal lymphadenopathy is seen.  No pericardial effusion is identified.  A likely small subcarinal node is noted abutting the esophagus; this remains grossly normal in size.  No axillary lymphadenopathy is seen.  The visualized portions of the thyroid gland  are unremarkable in appearance.  The visualized portions of the liver and spleen are unremarkable. The visualized portions of the pancreas, gallbladder, stomach, adrenal glands and kidneys are within normal limits.  The gallbladder appears largely decompressed.  No acute osseous abnormalities are seen.  IMPRESSION:  1.  No evidence of pulmonary embolus. 2.  Mild bibasilar atelectasis noted. 3.  Diffuse peribronchial thickening again seen, more prominent on the right.  This is thought to reflect the patient's underlying reactive airways disease; it appears slightly more prominent than in 2009.   Original Report Authenticated By: Tonia Ghent, M.D.    [facility] 1 View  04/24/2012  *RADIOLOGY REPORT*  Clinical Data: Cough. Shortness of breath.  Asthma.  PORTABLE CHEST - 1 VIEW  Comparison: 04/09/2012 CT.  04/08/2012 chest x-ray.  Findings: Peribronchial thickening may represent reactive airway changes and / or bronchitis.  Prominence mediastinal structures unchanged.  No segmental infiltrate, frank pulmonary edema or gross pneumothorax.  Heart size within normal limits.  IMPRESSION: Peribronchial thickening may represent reactive airway changes and / or bronchitis.  No segmental consolidation.  Central pulmonary vascular prominence without pulmonary edema.   Original Report Authenticated By: Lacy Duverney, M.D.     Admission HPI: [name] 42 yo pmh of asthma, HIV recent 1415 viral load and 680 CD4 1/14 presented with SOB. Pt uses albuterol inhaler for an average of 5 times daily and borrows a girlfriends nebulizer treatment on occasion "when it gets bad." Pt has not used any other inhalers and requires about 1x/month presentations to the ED for symptom management. Pt has been on prednisone for short periods of time. Most recent on 04/07/12 where CTA was also done and negative for PE but showed some peribronchial thickening. Pt denied any cough, fever/chills, myalgias, sick contacts, HA, n/v/d/c, CP, or  palpitations. Pt didn't have any LOC or presyncopal episodes. Pt does currently smoke daily along but this has decreased from 2ppd to 0.5 ppd with marijuana occasionally. Pt has not had any weight loss, recent travel, prior hospitalizations or intubations for asthma exacerbations. Pt triggers are dust, cold weather, and change in seasons.    Hospital Course by problem list: 1. Asthma exacerbation: Patient was admitted because he still had significant wheezing after initial nebulizer treatments. His CXR was normal. While in the hospital, patient received albuterol 5mg  and ipratropium 0.5mg  neb, and prednisone 60mg . Patient was given resources to apply for Medicaid and other financial assistance. He is sent home with albuterol inhaler, singular, and a short course of prednisone.   2. Alcohol withdrawal: Patient started to experience alcohol withdrawal symptoms on day 2 of hospitalization. His last drink was on Sunday night, the night before hospital admission. His symptoms consisted of tremors in bilateral UE, dizziness, nausea, SOB, and anxiety. He did not have seizure or symptoms of DT. He was placed on CIWA protocol and was given Ativan PRN. Patient only had a mild bilateral hand tremor by discharge. He was given Xanax on discharge. Pt not interested in detox or cessation treatment options.  3. HIV: No medication was given, and patient will follow up with START study coordinator for HIV treatments.   Discharge Vitals:  BP 116/74  Pulse 80  Temp(Src) 98.4 F (  36.9 C) (Oral)  Resp 18  Ht 5\' 6"  (1.676 m)  Wt 172 lb 9.9 oz (78.3 kg)  BMI 27.87 kg/m2  SpO2 94% General: resting in bed, NAD  HEENT: PERRL, EOMI, no scleral icterus  Cardiac: RRR, no rubs, murmurs or gallops  Pulm: clear to auscultation bilaterally, moving normal volumes of air  Abd: soft, nontender, nondistended, BS present  Ext: warm and well perfused, no pedal edema, slight hand tremor  Neuro: alert and oriented X3, cranial  nerves II-XII grossly intact  Discharge Labs:  Results for orders placed during the hospital encounter of 04/24/12 (from the past 24 hour(s))  INFLUENZA PANEL BY PCR     Status: None   Collection Time    04/24/12  1:15 PM      Result Value Range   Influenza A By PCR NEGATIVE  NEGATIVE   Influenza B By PCR NEGATIVE  NEGATIVE   H1N1 flu by pcr NOT DETECTED  NOT DETECTED  HEPATIC FUNCTION PANEL     Status: None   Collection Time    04/24/12  4:33 PM      Result Value Range   Total Protein 7.3  6.0 - 8.3 g/dL   Albumin 3.6  3.5 - 5.2 g/dL   AST 30  0 - 37 U/L   ALT 28  0 - 53 U/L   Alkaline Phosphatase 94  39 - 117 U/L   Total Bilirubin 0.4  0.3 - 1.2 mg/dL   Bilirubin, Direct <2.9  0.0 - 0.3 mg/dL   Indirect Bilirubin NOT CALCULATED  0.3 - 0.9 mg/dL  CBC     Status: None   Collection Time    04/24/12  4:33 PM      Result Value Range   WBC 7.1  4.0 - 10.5 K/uL   RBC 4.73  4.22 - 5.81 MIL/uL   Hemoglobin 15.1  13.0 - 17.0 g/dL   HCT 56.2  13.0 - 86.5 %   MCV 92.6  78.0 - 100.0 fL   MCH 31.9  26.0 - 34.0 pg   MCHC 34.5  30.0 - 36.0 g/dL   RDW 78.4  69.6 - 29.5 %   Platelets 278  150 - 400 K/uL  URINE RAPID DRUG SCREEN (HOSP PERFORMED)     Status: Abnormal   Collection Time    04/25/12  2:28 AM      Result Value Range   Opiates NONE DETECTED  NONE DETECTED   Cocaine NONE DETECTED  NONE DETECTED   Benzodiazepines NONE DETECTED  NONE DETECTED   Amphetamines NONE DETECTED  NONE DETECTED   Tetrahydrocannabinol POSITIVE (*) NONE DETECTED   Barbiturates NONE DETECTED  NONE DETECTED  BASIC METABOLIC PANEL     Status: Abnormal   Collection Time    04/25/12  5:55 AM      Result Value Range   Sodium 142  135 - 145 mEq/L   Potassium 3.5  3.5 - 5.1 mEq/L   Chloride 105  96 - 112 mEq/L   CO2 24  19 - 32 mEq/L   Glucose, Bld 124 (*) 70 - 99 mg/dL   BUN 14  6 - 23 mg/dL   Creatinine, Ser 2.84  0.50 - 1.35 mg/dL   Calcium 9.1  8.4 - 13.2 mg/dL   GFR calc non Af Amer >90  >90 mL/min    GFR calc Af Amer >90  >90 mL/min  CBC     Status: None   Collection Time  04/25/12  5:55 AM      Result Value Range   WBC 8.1  4.0 - 10.5 K/uL   RBC 4.58  4.22 - 5.81 MIL/uL   Hemoglobin 14.0  13.0 - 17.0 g/dL   HCT 56.2  13.0 - 86.5 %   MCV 93.0  78.0 - 100.0 fL   MCH 30.6  26.0 - 34.0 pg   MCHC 32.9  30.0 - 36.0 g/dL   RDW 78.4  69.6 - 29.5 %   Platelets 253  150 - 400 K/uL    Signed: Christen Bame 04/25/2012, 12:22 PM   Time Spent on Discharge: 30 min Services Ordered on Discharge: none Equipment Ordered on Discharge: none

## 2012-04-25 NOTE — Progress Notes (Signed)
Internal Medicine Teaching Service Attending Note Date: 04/25/2012  Patient name: John [name]  Medical record number: [id number]  Date of birth: 24-May-1970    This patient has been seen and discussed with the house staff. Please see their note for complete details. I concur with their findings with the following additions/corrections:  42 year old with POORLy controlled asthma. His lungs sound better this am with albuterol and steroids.  He CLEARLY NEEDS HELP WITH OBTAINING INHALED LONG ACTING STEROID SUCH AS FLUTICASONE , OR FLUNISOLIDE (HE COULD NOT BE ON FORMER IF HE WENT ON [facility] BASED THERAPY DOWN THE ROAD FOR HIV due to risk of AI)  HE NEEDS HELP FROM CASE MANAGEMENT. Can Pharmacy give him sample of inhaled steroid and can he get pt assistance program?  In meantime I would treat him with 2 week course of prednisone, make sure he has MDI for albuterol that he can affordd.  With re to his HIV he is in DEFERRED arm for treatment of START trial. Given his low VL and healthy CD4 count it will likely be several years before he drops below the 350 trigger point for starting therapy in this randomized controlled trial.    Paulette Blanch Dam 04/25/2012, 10:59 AM

## 2012-04-25 NOTE — Progress Notes (Deleted)
0830 Patient Lovenox education started today currently watching video Patient Guide to Lovenox.

## 2012-04-25 NOTE — Discharge Summary (Cosign Needed)
Physician Discharge Summary  Patient ID: John [name] MRN: [id number] DOB/AGE: 09-01-1970 42 y.o.  Admit date: 04/24/2012 Discharge date: 04/25/2012  Admission Diagnoses:  Primary diagnosis: asthma exacerbation Secondary diagnosis: HIV   Discharge Diagnoses:  Primary diagnosis: asthma exacerbation Secondary diagnosis: alcohol withdrawal, HIV  Admission HPI: John [name] is a 42 y.o. African American male with h/o asthma and HIV presents with increasing dyspnea, wheezing, and cough. Patient has ran out of "inhaler medicine" on Thursday night and noticed worsening dyspnea and wheezing on Friday. He called EMS, and they came to give him inhaler which helped with his breathing temporarily. By Saturday, patient felt that his dyspnea and wheezing became worse. He noted chest tightness, cough, and dizziness along with extreme dyspnea. Sitting still alleviates the wheezing, but not the dyspnea, and inhalers improves all of his symptoms, and nebulizer treatments helps as well. He last came to ED with asthma exacerbation on 04/08/2012, but was considered stable and was sent home with inhaler. Patient has been diagnosed with asthma in the early 90's, and symptoms have worsened with time. He currently needs his inhaler 5 times a day, and at least once a night per week. He was prescribed oral prednisone around two months ago, and have ran out of the medication. He vaguely remembers using inhaled steroids, but says that he no longer uses them. He has never been hospitalized for asthma prior to today, but feel like that he would be at the hospital every week if he didn't have his inhaler. He has never been intubated. His triggers are dust, cold weather, and exertion. He smokes 1-0.5 pack every 3-4 days, but used to smoke 1 pack/day. He reports having productive cough with clear to gray sputum, but no fever, chills, or incidence of flu lately. Patient denies having any chest pain or palpitations.   Admission Physical  Exam: Constitutional: He is oriented to person, place, and time. He appears well-developed. No distress.  HENT:  Head: Normocephalic.  Left Ear: Decreased hearing is noted.  Nose: Nose normal. No rhinorrhea.  Mouth/Throat: Oropharynx is clear and moist.  Eyes: Conjunctivae and EOM are normal. Pupils are equal, round, and reactive to light. No scleral icterus.  Neck: Full passive range of motion without pain. No mass and no thyromegaly present.  Cardiovascular: Normal rate, regular rhythm and normal heart sounds. Exam reveals no gallop and no friction rub.  No murmur heard.  Pulses:  Radial pulses are 2+ on the right side, and 2+ on the left side.  Posterior tibial pulses are 2+ on the right side, and 2+ on the left side.  Respiratory: Effort normal and breath sounds normal. No respiratory distress. He has no wheezes. He has no rhonchi. He has no rales.  GI: Soft. Normal appearance and bowel sounds are normal. He exhibits no distension. There is no hepatomegaly. There is no tenderness. There is no rebound, no guarding and no CVA tenderness.  Musculoskeletal:  Right upper arm: Normal.  Left upper arm: Normal.  Right upper leg: Normal.  Left upper leg: Normal.  Patient complained of cramping pain along the tendon for 4th tarsal on both feet.  Lymphadenopathy:  He has no cervical adenopathy.  Neurological: He is alert and oriented to person, place, and time. He has normal strength. No cranial nerve deficit or sensory deficit.  Skin: Skin is warm and dry. No rash noted. He is not diaphoretic. No erythema.  Psychiatric: He has a normal mood and affect. His speech is normal and behavior is  normal. Thought content normal.   Consults: none  Hospital Course:  1. Asthma exacerbation:  Patient was admitted because he still had significant wheezing after initial nebulizer treatments. His CXR was normal. While in the hospital, patient received albuterol 5mg  and ipratropium 0.5mg  neb, and prednisone  60mg . His symptoms were well-controlled, and patient had no exacerbation while in the hospital. Patient was given resources to apply for Medicaid and other financial assistance. He is sent home with albuterol inhaler, trial supply of inhaled corticosteroids, and a short course of prednisone.   2. Alcohol withdrawal  Patient started to experience alcohol withdrawal symptoms on day 2 of hospitalization. His last drink was on Sunday night, the night before hospital admission. His symptoms consisted of tremors in bilateral UE, dizziness, nausea, SOB, and anxiety. He did not have seizure or symptoms of DT. He was placed on CIWA protocol and was given Ativan PRN. Patient only had a mild bilateral hand tremor by discharge. He was given Xanax on discharge as patient does not plan to have detoxification and most likely will go back to drinking.   3. HIV: No medication was given, and patient will follow up with START study coordinator for HIV treatments.   Discharge Exam: Blood pressure 116/74, pulse 80, temperature 98.4 F (36.9 C), temperature source Oral, resp. rate 18, height 5\' 6"  (1.676 m), weight 78.3 kg (172 lb 9.9 oz), SpO2 94.00%. Physical Exam  Constitutional: He is oriented to person, place, and time. He appears well-developed.  HENT:  Head: Normocephalic and atraumatic.  Right Ear: External ear normal.  Left Ear: External ear normal.  Nose: Nose normal.  Mouth/Throat: Oropharynx is clear and moist.  Diminished hearing in left ear.  Eyes: Conjunctivae and EOM are normal. Pupils are equal, round, and reactive to light. No scleral icterus.  Neck: No thyromegaly present.  Cardiovascular: Normal rate, regular rhythm and normal heart sounds.  Exam reveals no gallop and no friction rub.   No murmur heard. Pulmonary/Chest: No accessory muscle usage. He has wheezes (Diffuse throughout bilateral lung fields). He has no rales. He exhibits no tenderness.  Abdominal: Soft. Bowel sounds are normal. He  exhibits no distension. There is no tenderness.  Musculoskeletal: He exhibits no edema and no tenderness.  Lymphadenopathy:    He has no cervical adenopathy.  Neurological: He is alert and oriented to person, place, and time. He exhibits normal muscle tone.  Skin: Skin is warm and dry. No rash noted. He is not diaphoretic.   Discharge day labs: BMET    Component Value Date/Time   NA 142 04/25/2012 0555   K 3.5 04/25/2012 0555   CL 105 04/25/2012 0555   CO2 24 04/25/2012 0555   GLUCOSE 124* 04/25/2012 0555   BUN 14 04/25/2012 0555   CREATININE 0.85 04/25/2012 0555   CREATININE 0.93 08/30/2011 0959   CALCIUM 9.1 04/25/2012 0555   GFRNONAA >90 04/25/2012 0555   GFRAA >90 04/25/2012 0555   CBC    Component Value Date/Time   WBC 8.1 04/25/2012 0555   RBC 4.58 04/25/2012 0555   HGB 14.0 04/25/2012 0555   HCT 42.6 04/25/2012 0555   PLT 253 04/25/2012 0555   MCV 93.0 04/25/2012 0555   MCH 30.6 04/25/2012 0555   MCHC 32.9 04/25/2012 0555   RDW 14.8 04/25/2012 0555   LYMPHSABS 3.1 04/08/2012 1648   MONOABS 0.6 04/08/2012 1648   EOSABS 0.2 04/08/2012 1648   BASOSABS 0.0 04/08/2012 1648   Drugs of Abuse  Component Value Date/Time   LABOPIA NONE DETECTED 04/25/2012 0228   COCAINSCRNUR NONE DETECTED 04/25/2012 0228   LABBENZ NONE DETECTED 04/25/2012 0228   AMPHETMU NONE DETECTED 04/25/2012 0228   THCU POSITIVE* 04/25/2012 0228   LABBARB NONE DETECTED 04/25/2012 0228        Disposition: 01-Home or Self Care   Future Appointments Provider Department Dept Phone   04/27/2012 2:30 PM Roderic Scarce, [facility] for Infectious Disease [phone]   05/03/2012 11:00 AM [facility] for Infectious Disease [phone]     Medications: -Albuterol 2.5mg /43mL, inhaled, TID, PRN -Mometasone-formoterol 100-25mcg/ACT inhaler, 2 puffs, BID -Prednisone 60mg  PO qD, decrease by 20mg  each day until 0. -Alprazolam 0.25mg  PO TID, PRN  Signed: Rhea Pink 04/25/2012, 9:41 AM

## 2012-04-26 MED ORDER — ALBUTEROL SULFATE (5 MG/ML) 0.5% IN NEBU: 2.5000 mg | INHALATION_SOLUTION | Freq: Two times a day (BID) | RESPIRATORY_TRACT | Status: DC

## 2012-04-26 MED ORDER — ALBUTEROL SULFATE HFA 108 (90 BASE) MCG/ACT IN AERS
2.0000 | INHALATION_SPRAY | Freq: Four times a day (QID) | RESPIRATORY_TRACT | Status: DC | PRN
  Filled 2012-04-26: qty 6.7

## 2012-04-26 MED ORDER — ALPRAZOLAM 0.25 MG PO TABS: 0.2500 mg | ORAL_TABLET | Freq: Every evening | ORAL | Status: DC | PRN

## 2012-04-26 NOTE — Progress Notes (Signed)
Subjective: Pt had started EtOH withdrawal symptoms yesterday and stayed overnight for observation. Pt doing better this AM. Tremors resolving and no hallucinations. Pt will just need help with bus ticket home.   Objective: Vital signs in last 24 hours: Filed Vitals:   04/26/12 0030 04/26/12 0453 04/26/12 0554 04/26/12 0752  BP: 130/65  114/62   Pulse: 88  71   Temp: 98.2 F (36.8 C)  98.1 F (36.7 C)   TempSrc: Oral  Oral   Resp: 16  16   Height:      Weight:  172 lb 13.5 oz (78.4 kg)    SpO2: 98%  99% 100%   Weight change: -1 lb 7.4 oz (-0.662 kg)  Intake/Output Summary (Last 24 hours) at 04/26/12 0807 Last data filed at 04/25/12 1847  Gross per 24 hour  Intake    841 ml  Output      0 ml  Net    841 ml   General: resting in bed, NAD HEENT: PERRL, EOMI, no scleral icterus Cardiac: RRR, no rubs, murmurs or gallops Pulm: clear to auscultation bilaterally, moving normal volumes of air Abd: soft, nontender, nondistended, BS present Ext: warm and well perfused, no pedal edema, slight hand tremor Neuro: alert and oriented X3, cranial nerves II-XII grossly intact  Lab Results: Basic Metabolic Panel:  Recent Labs Lab 04/24/12 1102 04/25/12 0555  NA 140 142  K 3.6 3.5  CL 102 105  CO2  --  24  GLUCOSE 172* 124*  BUN 12 14  CREATININE 1.00 0.85  CALCIUM  --  9.1   Liver Function Tests:  Recent Labs Lab 04/24/12 1633  AST 30  ALT 28  ALKPHOS 94  BILITOT 0.4  PROT 7.3  ALBUMIN 3.6   No results found for this basename: LIPASE, AMYLASE,  in the last 168 hours No results found for this basename: AMMONIA,  in the last 168 hours CBC:  Recent Labs Lab 04/24/12 1633 04/25/12 0555  WBC 7.1 8.1  HGB 15.1 14.0  HCT 43.8 42.6  MCV 92.6 93.0  PLT 278 253   Urine Drug Screen: Drugs of Abuse     Component Value Date/Time   LABOPIA NONE DETECTED 04/25/2012 0228   COCAINSCRNUR NONE DETECTED 04/25/2012 0228   LABBENZ NONE DETECTED 04/25/2012 0228   AMPHETMU NONE  DETECTED 04/25/2012 0228   THCU POSITIVE* 04/25/2012 0228   LABBARB NONE DETECTED 04/25/2012 0228    Micro Results: No results found for this or any previous visit (from the past 240 hour(s)). Studies/Results: Dg Chest Port 1 View  04/24/2012  *RADIOLOGY REPORT*  Clinical Data: Cough. Shortness of breath.  Asthma.  PORTABLE CHEST - 1 VIEW  Comparison: 04/09/2012 CT.  04/08/2012 chest x-ray.  Findings: Peribronchial thickening may represent reactive airway changes and / or bronchitis.  Prominence mediastinal structures unchanged.  No segmental infiltrate, frank pulmonary edema or gross pneumothorax.  Heart size within normal limits.  IMPRESSION: Peribronchial thickening may represent reactive airway changes and / or bronchitis.  No segmental consolidation.  Central pulmonary vascular prominence without pulmonary edema.   Original Report Authenticated By: Lacy Duverney, M.D.    Medications: I have reviewed the patient's current medications. Scheduled Meds: . albuterol  2.5 mg Nebulization BID  . enoxaparin (LOVENOX) injection  40 mg Subcutaneous Q24H  . mometasone-formoterol  2 puff Inhalation BID  . predniSONE  60 mg Oral Q breakfast  . sodium chloride  3 mL Intravenous Q12H   Continuous Infusions:  PRN Meds:.sodium  chloride, acetaminophen, acetaminophen, albuterol, LORazepam, LORazepam, sodium chloride Assessment/Plan: 1. Asthma exacerbation: pt had improvement with nebs in ED but some desats on ambulation. Some slight wheezes and decreased peak flow per pt. Pt continued to do well. Was started on prednisone taper, flu panel negative. Prescription for ICS inhaler given.   2. Etoh withdrawal: pt experienced tremors and some tachycardia yesterday with some flushing in the afternoon right before discharge. Pt responded to ativan.  -xanax 0.25mg  given as outpt -social work to provide bus ticket home   Dispo: Disposition is deferred at this time, awaiting improvement of current medical problems.   Anticipated discharge in approximately 1 day(s).   The patient does not have a current PCP (Default, Provider, MD), therefore will not be requiring OPC follow-up after discharge.   The patient does have transportation limitations that hinder transportation to clinic appointments.  .Services Needed at time of discharge: Y = Yes, Blank = No PT:   OT:   RN:   Equipment:   Other:     LOS: 2 days   Christen Bame 04/26/2012, 8:07 AM

## 2012-04-26 NOTE — Progress Notes (Signed)
1500 Discharge instructions reviewed with patient and prescriptions given to patient. Also patient made aware to pick up medication from pharmacy verbalize and understands. Skin WNL

## 2012-04-26 NOTE — Progress Notes (Signed)
Resident Addendum to Medical Student Note   I have seen and examined the patient, and agree with the the medical student assessment and plan outlined above. Please see my brief note for additional details.   Christen Bame  04/26/2012, 8:16 PM

## 2012-04-26 NOTE — Progress Notes (Signed)
Medical Student Daily Progress Note  Subjective: Yesterday afternoon, patient was walking in the hall with nurse when he suddenly felt faint and dizzy, but no LOC. He reported having some SOB, headache, nystagmus to the left for 30-40 minutes, nausea, and extreme bilateral UE tremors at that time. Patient was wheel-chaired back to room and given Ativan, which alleviated his symptoms, but tremors remain. This morning, patient denies all symptoms except for tremors in hand, and patient only needed one dose of Ativan overnight. Patient reports drinking more heavily in the past 2-3 months, 6-7 cups everyday with frequent blackouts. His last drink was on Sunday night around 8pm. He reports that in the past, he has experienced the shakes, visual hallucinations, and confusion during alcohol withdrawal, but not seizures. In those instances, he used alcohol to relieve his symptoms.  Currently, he is in the precontemplative stage for stopping alcohol or cutting back on drinking.   Patient has not had any more severe wheezing and SOB associated with his asthma.    Objective: Vital signs in last 24 hours: Filed Vitals:   04/25/12 1918 04/26/12 0030 04/26/12 0453 04/26/12 0554  BP:  130/65  114/62  Pulse:  88  71  Temp:  98.2 F (36.8 C)  98.1 F (36.7 C)  TempSrc:  Oral  Oral  Resp:  16  16  Height:      Weight:   78.4 kg (172 lb 13.5 oz)   SpO2: 98% 98%  99%   Weight change: -0.662 kg (-1 lb 7.4 oz)  Intake/Output Summary (Last 24 hours) at 04/26/12 0725 Last data filed at 04/25/12 1847  Gross per 24 hour  Intake    841 ml  Output      0 ml  Net    841 ml   Physical Exam:  General Appearance:    Alert, cooperative, no distress, appears stated age  Head:    Normocephalic, without obvious abnormality, atraumatic  Eyes:    PERRLA, conjunctiva/corneas clear, EOM's intact, both eyes    Ears:    Decreased hearing in left ear but no change since admission  Nose:   Nares normal, septum midline,  mucosa normal, no drainage    or sinus tenderness  Throat:   Lips, mucosa, and tongue normal; teeth has yellowing, and several molars are missing,  gums normal  Neck:   Supple, symmetrical, trachea midline, no adenopathy;       thyroid:  No enlargement/tenderness/nodules; no carotid   bruit or JVD  Back:     Symmetric, no curvature, ROM normal, no CVA tenderness  Lungs:     Expiratory wheezes diffusely throughout bilateral lungs, mild WOB, able to talk without dyspnea  Chest wall:    No tenderness or deformity  Heart:    Regular rate and rhythm, S1 and S2 normal, no murmur, rub   or gallop  Abdomen:     Soft, non-tender, bowel sounds active all four quadrants,    no masses, no organomegaly  Extremities:   Resting and intentional tremors in fingers of both hands, atraumatic, no cyanosis or edema  Pulses:   2+ and symmetric all extremities  Skin:   Skin color, texture, turgor normal, no rashes or lesions  Lymph nodes:   Cervical, supraclavicular, and axillary nodes normal  Neurologic:   A&O x3, can spell "world" backwards with no problem, CNII-XII intact. Normal strength, 2+ patellar reflex, sensation     throughout   Lab Results: BMET    Component Value  Date/Time   NA 142 04/25/2012 0555   K 3.5 04/25/2012 0555   CL 105 04/25/2012 0555   CO2 24 04/25/2012 0555   GLUCOSE 124* 04/25/2012 0555   BUN 14 04/25/2012 0555   CREATININE 0.85 04/25/2012 0555   CREATININE 0.93 08/30/2011 0959   CALCIUM 9.1 04/25/2012 0555   GFRNONAA >90 04/25/2012 0555   GFRAA >90 04/25/2012 0555    CBC    Component Value Date/Time   WBC 8.1 04/25/2012 0555   RBC 4.58 04/25/2012 0555   HGB 14.0 04/25/2012 0555   HCT 42.6 04/25/2012 0555   PLT 253 04/25/2012 0555   MCV 93.0 04/25/2012 0555   MCH 30.6 04/25/2012 0555   MCHC 32.9 04/25/2012 0555   RDW 14.8 04/25/2012 0555   LYMPHSABS 3.1 04/08/2012 1648   MONOABS 0.6 04/08/2012 1648   EOSABS 0.2 04/08/2012 1648   BASOSABS 0.0 04/08/2012 1648   Drugs of Abuse      Component Value Date/Time   LABOPIA NONE DETECTED 04/25/2012 0228   COCAINSCRNUR NONE DETECTED 04/25/2012 0228   LABBENZ NONE DETECTED 04/25/2012 0228   AMPHETMU NONE DETECTED 04/25/2012 0228   THCU POSITIVE* 04/25/2012 0228   LABBARB NONE DETECTED 04/25/2012 0228     Micro Results: No results found for this or any previous visit (from the past 240 hour(s)). Studies/Results: Dg Chest Port 1 View  04/24/2012  *RADIOLOGY REPORT*  Clinical Data: Cough. Shortness of breath.  Asthma.  PORTABLE CHEST - 1 VIEW  Comparison: 04/09/2012 CT.  04/08/2012 chest x-ray.  Findings: Peribronchial thickening may represent reactive airway changes and / or bronchitis.  Prominence mediastinal structures unchanged.  No segmental infiltrate, frank pulmonary edema or gross pneumothorax.  Heart size within normal limits.  IMPRESSION: Peribronchial thickening may represent reactive airway changes and / or bronchitis.  No segmental consolidation.  Central pulmonary vascular prominence without pulmonary edema.   Original Report Authenticated By: Lacy Duverney, M.D.    Medications: I have reviewed the patient's current medications. Scheduled Meds: . albuterol  2.5 mg Nebulization Q6H  . enoxaparin (LOVENOX) injection  40 mg Subcutaneous Q24H  . ipratropium  0.5 mg Nebulization Q6H  . mometasone-formoterol  2 puff Inhalation BID  . predniSONE  60 mg Oral Q breakfast  . sodium chloride  3 mL Intravenous Q12H   Continuous Infusions:  PRN Meds:.sodium chloride, acetaminophen, acetaminophen, LORazepam, LORazepam, sodium chloride Assessment/Plan: Active Problems:   HIV INFECTION   Alcohol abuse   Cigarette smoker   Asthma exacerbation  John [name] is a 42 yo [place] American man with h/o HIV and asthma who presents with shortness of breath, wheezing, and cough.  1. Tremors, dizziness, fainting, nystagmus Patient has a history of heavy alcohol use, tremors and hallucinations with alcohol withdrawal, and is currently  experiencing symptoms of withdrawal as noted by his tremors, dizziness, nausea, nystagmus. His last CIWA score was 6 at 8pm 2/11. Patient does not appear to have DT as he is oriented, afebrile, vitals signs within normal range, and no hallucinations or confusion. The fainting sensation is unlikely due to orthostatic hypotension because he has been walking for a while and not immediately after standing up when he initially had his symptoms. Patient did not have LOC. -Continue Ativan PRN per CIWA protocol -Monitor for seizures and DTs.  2. Asthma exacerbation  Patient's asthma exacerbation appears to be under control with good spO2 on room air, and patient did not wake up at night due to respiratory symptoms. His PEF is still at  350L/min -400L/min with unknown baseline. SW was consulted, and patient will need Medicaid application as well as assistance from case management to obtain his inhaled steroids. He currently has 6-9 month supply of inhaled corticosteroids for when he leaves the hospital.  -Monitor PEF  -Continue patient on inhaled albuterol and ipratropium for acute symptoms  -Continue predisone PO for 1 week after being discharged from the hospital -Consider inhaled corticosteroids for long term management on discharge -Consult SW and case management for medicaid application for this patient  2. HIV  -Currently enrolled in START trial, will refer patient to Emergency planning/management officer for HIV control.   FEN/GI  -Regular diet  -Replace electrolytes as necessary  -GI: omeprazole PRN   Proph:  -DVT: lovenox  Dispo: -Patient is ready for discharge since his symptoms are well controlled.    LOS: 2 days   This is a Psychologist, occupational Note.  The care of the patient was discussed with Dr. Daiva Eves and Dr. Burtis Junes and the assessment and plan formulated with their assistance.  Please see their attached note for official documentation of the daily encounter.  Rhea Pink 04/26/2012, 7:25 AM

## 2012-04-26 NOTE — H&P (Signed)
Internal Medicine Teaching Service Attending Note Date: 04/26/2012  Patient name: John [name]  Medical record number: [id number]  Date of birth: 01/30/1971    This patient has been seen and discussed with the house staff. Please see their note for complete details. I concur with their findings with the following additions/corrections:  Please see my note from the same date for further details  Acey Lav 04/26/2012, 1:17 PM

## 2012-04-27 ENCOUNTER — Ambulatory Visit: Payer: Self-pay

## 2012-05-03 ENCOUNTER — Ambulatory Visit (INDEPENDENT_AMBULATORY_CARE_PROVIDER_SITE_OTHER): Payer: Federal, State, Local not specified - Other | Admitting: *Deleted

## 2012-05-03 VITALS — BP 128/85 | HR 104 | Temp 98.5°F | Resp 18 | Wt 177.8 lb

## 2012-05-03 DIAGNOSIS — B20 Human immunodeficiency virus [HIV] disease: Secondary | ICD-10-CM

## 2012-05-03 NOTE — Progress Notes (Signed)
Pt here for START study, 28 month visit. Pt on deferred arm. Recently admitted to hospital (04/24/12-04/26/12) for Acute Asthma Exacerbation. Problem has resolved and currently continues to take his Advair, Albuterol, Xanax, and Prednisone. Pt denies any new problems, signs, or symptoms. Non-fasting labs were drawn. Pt received $20 gift card for visit. Next appt scheduled for Tues, August 22, 2012 @ 9:30. Tacey Heap RN

## 2012-05-04 ENCOUNTER — Encounter (HOSPITAL_COMMUNITY): Payer: Self-pay | Admitting: *Deleted

## 2012-05-04 ENCOUNTER — Emergency Department (HOSPITAL_COMMUNITY)
Admission: EM | Admit: 2012-05-04 | Discharge: 2012-05-05 | Disposition: A | Discharge status: Home / Self Care | Payer: Federal, State, Local not specified - Other | Attending: Emergency Medicine | Admitting: Emergency Medicine

## 2012-05-04 DIAGNOSIS — F3289 Other specified depressive episodes: Secondary | ICD-10-CM | POA: Insufficient documentation

## 2012-05-04 DIAGNOSIS — F411 Generalized anxiety disorder: Secondary | ICD-10-CM | POA: Insufficient documentation

## 2012-05-04 DIAGNOSIS — F17200 Nicotine dependence, unspecified, uncomplicated: Secondary | ICD-10-CM | POA: Insufficient documentation

## 2012-05-04 DIAGNOSIS — F329 Major depressive disorder, single episode, unspecified: Secondary | ICD-10-CM | POA: Insufficient documentation

## 2012-05-04 DIAGNOSIS — J45901 Unspecified asthma with (acute) exacerbation: Secondary | ICD-10-CM | POA: Insufficient documentation

## 2012-05-04 DIAGNOSIS — IMO0002 Reserved for concepts with insufficient information to code with codable children: Secondary | ICD-10-CM | POA: Insufficient documentation

## 2012-05-04 DIAGNOSIS — Z79899 Other long term (current) drug therapy: Secondary | ICD-10-CM | POA: Insufficient documentation

## 2012-05-04 DIAGNOSIS — Z21 Asymptomatic human immunodeficiency virus [HIV] infection status: Secondary | ICD-10-CM | POA: Insufficient documentation

## 2012-05-04 LAB — CD4/CD8 (T-HELPER/T-SUPPRESSOR CELL)
CD4%: 29.8
CD8 % Suppressor T Cell: 56.1
CD8: 898

## 2012-05-04 LAB — HIV-1 RNA QUANT-NO REFLEX-BLD: HIV 1 RNA Quant: 1710 copies/mL — ABNORMAL HIGH (ref <20)

## 2012-05-04 MED ORDER — ONDANSETRON HCL 4 MG/2ML IJ SOLN
4.0000 mg | Freq: Once | INTRAMUSCULAR | Status: AC
  Administered 2012-05-04: 4 mg via INTRAVENOUS

## 2012-05-04 MED ORDER — ALBUTEROL SULFATE (5 MG/ML) 0.5% IN NEBU
2.5000 mg | INHALATION_SOLUTION | Freq: Once | RESPIRATORY_TRACT | Status: AC
  Administered 2012-05-04: 2.5 mg via RESPIRATORY_TRACT

## 2012-05-04 MED ORDER — ALBUTEROL SULFATE (5 MG/ML) 0.5% IN NEBU
INHALATION_SOLUTION | RESPIRATORY_TRACT | Status: AC
  Filled 2012-05-04: qty 0.5

## 2012-05-04 MED ORDER — IPRATROPIUM BROMIDE 0.02 % IN SOLN
RESPIRATORY_TRACT | Status: AC
  Filled 2012-05-04: qty 2.5

## 2012-05-04 MED ORDER — ONDANSETRON HCL 4 MG/2ML IJ SOLN
INTRAMUSCULAR | Status: AC
  Filled 2012-05-04: qty 2

## 2012-05-04 MED ORDER — IPRATROPIUM BROMIDE 0.02 % IN SOLN
0.5000 mg | Freq: Once | RESPIRATORY_TRACT | Status: AC
  Administered 2012-05-04: 0.5 mg via RESPIRATORY_TRACT

## 2012-05-04 NOTE — ED Notes (Signed)
The pt arrived by [place] with difficulty breathing while walking along the road.  He had left his inhaler at home and  Was very sob when ems arrived.. Iv per ems  hhn with 5.0 albuterol and 125 mg of solu-medrol iv.  At present he just finished his hhn and is still a little sob. His lung sounds were diminished but after the hhn he is now wheezing

## 2012-05-04 NOTE — ED Provider Notes (Signed)
History     CSN: [id number]  Arrival date & time 05/04/12  2252   First MD Initiated Contact with Patient 05/04/12 2305      Chief Complaint  Patient presents with  . Shortness of Breath    (Consider location/radiation/quality/duration/timing/severity/associated sxs/prior treatment) HPI 42 year old male presents to emergency department via EMS with complaint of shortness of breath and wheezing. Patient has history of asthma. He reports he has been drinking tonight. He reports that he misplaced his code earlier in the day, has been without his Advair and albuterol. Patient with recent admission for asthma exacerbation, discharged on the 10th. Patient reports he filled his prescription for prednisone this past Monday, and has been taking it as prescribed, albeit late. Patient reports he is still smoking, reports he has cut back to 4-1/2  cigarettes a day. He denies any chest pain, no fevers, no change in his sputum. Symptoms started suddenly this evening. Patient has past history of asthma and HIV. Past Medical History  Diagnosis Date  . Asthma   . HIV (human immunodeficiency virus infection)   . Mental disorder   . Anxiety   . Depression   . Shortness of breath     Past Surgical History  Procedure Laterality Date  . Skin graft full thickness leg      POSTERIOR LEFT LEG  AFTER DOG BITES    No family history on file.  History  Substance Use Topics  . Smoking status: Current Every Day Smoker -- 1.00 packs/day for 25 years    Types: Cigarettes  . Smokeless tobacco: Never Used  . Alcohol Use: 0.0 oz/week     Comment: drinking 1.5 fifths of vodka, beer 12-pack /day      Review of Systems  All other systems reviewed and are negative.    Allergies  Shellfish allergy  Home Medications   Current Outpatient Rx  Name  Route  Sig  Dispense  Refill  . albuterol (PROVENTIL HFA;VENTOLIN HFA) 108 (90 BASE) MCG/ACT inhaler   Inhalation   Inhale 2 puffs into the lungs every 6  (six) hours as needed for wheezing.         Marland Kitchen ALPRAZolam (XANAX) 0.25 MG tablet   Oral   Take 1 tablet (0.25 mg total) by mouth at bedtime as needed for sleep.   15 tablet   0   . Fluticasone-Salmeterol (ADVAIR DISKUS) 100-50 MCG/DOSE AEPB   Inhalation   Inhale 1 puff into the lungs daily.   60 each   12   . predniSONE (DELTASONE) 20 MG tablet   Oral   Take 3 tablets (60 mg total) by mouth daily with breakfast. Take 60 today, then 40 tablets 04/26/12 and then 1 tablet until finished   7 tablet   0     BP 103/67  Pulse 96  Temp(Src) 97.8 F (36.6 C) (Oral)  Resp 20  SpO2 97%  Physical Exam  Nursing note and vitals reviewed. Constitutional: He is oriented to person, place, and time. He appears well-developed and well-nourished. He appears distressed (uncomfortable appearing).  HENT:  Head: Normocephalic and atraumatic.  Nose: Nose normal.  Mouth/Throat: Oropharynx is clear and moist.  Eyes: Conjunctivae and EOM are normal. Pupils are equal, round, and reactive to light.  Neck: Normal range of motion. Neck supple. No JVD present. No tracheal deviation present. No thyromegaly present.  Cardiovascular: Normal rate, regular rhythm, normal heart sounds and intact distal pulses.  Exam reveals no gallop and no friction rub.  No murmur heard. Pulmonary/Chest: Effort normal. No stridor. No respiratory distress. He has wheezes. He has no rales. He exhibits no tenderness.  Prolonged expiratory phase, with end expiratory wheezing  Abdominal: Soft. Bowel sounds are normal. He exhibits no distension and no mass. There is no tenderness. There is no rebound and no guarding.  Musculoskeletal: Normal range of motion. He exhibits no edema and no tenderness.  Lymphadenopathy:    He has no cervical adenopathy.  Neurological: He is alert and oriented to person, place, and time. He exhibits normal muscle tone. Coordination normal.  Skin: Skin is warm and dry. No rash noted. No erythema. No  pallor.  Psychiatric: He has a normal mood and affect. His behavior is normal. Judgment and thought content normal.    ED Course  Procedures (including critical care time)  Labs Reviewed - No data to display No results found.   1. Asthma exacerbation       MDM  42 year old male with asthma exacerbation. We'll give nebulized treatments here, hoped to turn around in order to discharge him home.        Olivia Mackie, MD 05/05/12 [phone]

## 2012-05-05 MED ORDER — ALBUTEROL SULFATE HFA 108 (90 BASE) MCG/ACT IN AERS: 1.0000 | INHALATION_SPRAY | RESPIRATORY_TRACT | Status: DC | PRN

## 2012-05-05 NOTE — ED Notes (Signed)
The patient is AOx4 and comfortable with his discharge instructions. 

## 2012-05-05 NOTE — ED Notes (Signed)
Pt ambulated in hall. Tolerated well. O2 stayed at 97% the whole time.

## 2012-05-18 ENCOUNTER — Telehealth: Payer: Self-pay | Admitting: Internal Medicine

## 2012-05-18 NOTE — Telephone Encounter (Signed)
Faxed application to [employer] today for Proventil.

## 2012-05-24 ENCOUNTER — Telehealth: Payer: Self-pay | Admitting: Internal Medicine

## 2012-05-24 NOTE — Telephone Encounter (Signed)
Called [name] to check status on [name]' application for his Proventil inhaler.  The application has been received.  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  It will take 2 - 3 weeks to be processed.  I told him I will check back on 3-24 to see if approved.

## 2012-05-29 ENCOUNTER — Encounter: Payer: Self-pay | Admitting: Internal Medicine

## 2012-06-06 ENCOUNTER — Inpatient Hospital Stay (HOSPITAL_COMMUNITY)
Admission: AD | Admit: 2012-06-06 | Discharge: 2012-06-15 | DRG: 885 | Disposition: A | Discharge status: Home / Self Care | Payer: Federal, State, Local not specified - Other | Source: Intra-hospital | Attending: Psychiatry | Admitting: Psychiatry

## 2012-06-06 ENCOUNTER — Emergency Department (HOSPITAL_COMMUNITY)
Admission: EM | Admit: 2012-06-06 | Discharge: 2012-06-06 | Disposition: A | Discharge status: Other Acute Inpatient Hospital | Payer: Self-pay | Attending: Emergency Medicine | Admitting: Emergency Medicine

## 2012-06-06 ENCOUNTER — Encounter (HOSPITAL_COMMUNITY): Payer: Self-pay | Admitting: *Deleted

## 2012-06-06 DIAGNOSIS — R259 Unspecified abnormal involuntary movements: Secondary | ICD-10-CM | POA: Insufficient documentation

## 2012-06-06 DIAGNOSIS — J45909 Unspecified asthma, uncomplicated: Secondary | ICD-10-CM | POA: Diagnosis present

## 2012-06-06 DIAGNOSIS — F3289 Other specified depressive episodes: Secondary | ICD-10-CM | POA: Insufficient documentation

## 2012-06-06 DIAGNOSIS — F411 Generalized anxiety disorder: Secondary | ICD-10-CM | POA: Insufficient documentation

## 2012-06-06 DIAGNOSIS — F332 Major depressive disorder, recurrent severe without psychotic features: Secondary | ICD-10-CM

## 2012-06-06 DIAGNOSIS — Z79899 Other long term (current) drug therapy: Secondary | ICD-10-CM | POA: Insufficient documentation

## 2012-06-06 DIAGNOSIS — Z8709 Personal history of other diseases of the respiratory system: Secondary | ICD-10-CM | POA: Insufficient documentation

## 2012-06-06 DIAGNOSIS — F4310 Post-traumatic stress disorder, unspecified: Secondary | ICD-10-CM | POA: Diagnosis present

## 2012-06-06 DIAGNOSIS — F1010 Alcohol abuse, uncomplicated: Secondary | ICD-10-CM

## 2012-06-06 DIAGNOSIS — F329 Major depressive disorder, single episode, unspecified: Secondary | ICD-10-CM | POA: Insufficient documentation

## 2012-06-06 DIAGNOSIS — F1210 Cannabis abuse, uncomplicated: Secondary | ICD-10-CM | POA: Diagnosis present

## 2012-06-06 DIAGNOSIS — F331 Major depressive disorder, recurrent, moderate: Secondary | ICD-10-CM | POA: Diagnosis present

## 2012-06-06 DIAGNOSIS — F323 Major depressive disorder, single episode, severe with psychotic features: Principal | ICD-10-CM | POA: Diagnosis present

## 2012-06-06 DIAGNOSIS — Z8659 Personal history of other mental and behavioral disorders: Secondary | ICD-10-CM | POA: Insufficient documentation

## 2012-06-06 DIAGNOSIS — F1020 Alcohol dependence, uncomplicated: Secondary | ICD-10-CM | POA: Diagnosis present

## 2012-06-06 DIAGNOSIS — Z21 Asymptomatic human immunodeficiency virus [HIV] infection status: Secondary | ICD-10-CM | POA: Insufficient documentation

## 2012-06-06 DIAGNOSIS — F17200 Nicotine dependence, unspecified, uncomplicated: Secondary | ICD-10-CM | POA: Insufficient documentation

## 2012-06-06 LAB — CBC
Hemoglobin: 14.9 g/dL (ref 13.0–17.0)
MCH: 32.7 pg (ref 26.0–34.0)
MCHC: 35.1 g/dL (ref 30.0–36.0)
Platelets: 244 10*3/uL (ref 150–400)
RBC: 4.55 MIL/uL (ref 4.22–5.81)

## 2012-06-06 LAB — COMPREHENSIVE METABOLIC PANEL
ALT: 115 U/L — ABNORMAL HIGH (ref 0–53)
AST: 198 U/L — ABNORMAL HIGH (ref 0–37)
Alkaline Phosphatase: 109 U/L (ref 39–117)
CO2: 25 mEq/L (ref 19–32)
Calcium: 9 mg/dL (ref 8.4–10.5)
Potassium: 3.8 mEq/L (ref 3.5–5.1)
Sodium: 143 mEq/L (ref 135–145)
Total Protein: 7.4 g/dL (ref 6.0–8.3)

## 2012-06-06 LAB — RAPID URINE DRUG SCREEN, HOSP PERFORMED
Barbiturates: NOT DETECTED
Benzodiazepines: NOT DETECTED
Cocaine: NOT DETECTED
Tetrahydrocannabinol: POSITIVE — AB

## 2012-06-06 MED ORDER — TRAZODONE HCL 50 MG PO TABS
50.0000 mg | ORAL_TABLET | Freq: Every evening | ORAL | Status: DC | PRN
  Administered 2012-06-07 – 2012-06-08 (×3): 50 mg via ORAL
  Filled 2012-06-06 (×8): qty 1

## 2012-06-06 MED ORDER — ALBUTEROL SULFATE HFA 108 (90 BASE) MCG/ACT IN AERS: 2.0000 | INHALATION_SPRAY | Freq: Four times a day (QID) | RESPIRATORY_TRACT | Status: DC | PRN

## 2012-06-06 MED ORDER — IBUPROFEN 400 MG PO TABS: 600.0000 mg | ORAL_TABLET | Freq: Three times a day (TID) | ORAL | Status: DC | PRN

## 2012-06-06 MED ORDER — LORAZEPAM 2 MG/ML IJ SOLN: 1.0000 mg | Freq: Four times a day (QID) | INTRAMUSCULAR | Status: DC | PRN

## 2012-06-06 MED ORDER — ALUM & MAG HYDROXIDE-SIMETH 200-200-20 MG/5ML PO SUSP: 30.0000 mL | ORAL | Status: DC | PRN

## 2012-06-06 MED ORDER — CHLORDIAZEPOXIDE HCL 25 MG PO CAPS
25.0000 mg | ORAL_CAPSULE | Freq: Four times a day (QID) | ORAL | Status: AC
  Administered 2012-06-06 – 2012-06-07 (×4): 25 mg via ORAL
  Filled 2012-06-06 (×4): qty 1

## 2012-06-06 MED ORDER — CHLORDIAZEPOXIDE HCL 25 MG PO CAPS
25.0000 mg | ORAL_CAPSULE | Freq: Every day | ORAL | Status: AC
  Administered 2012-06-10: 25 mg via ORAL
  Filled 2012-06-06: qty 1

## 2012-06-06 MED ORDER — THIAMINE HCL 100 MG/ML IJ SOLN
100.0000 mg | Freq: Once | INTRAMUSCULAR | Status: AC
  Administered 2012-06-06: 100 mg via INTRAMUSCULAR

## 2012-06-06 MED ORDER — NICOTINE 21 MG/24HR TD PT24
21.0000 mg | MEDICATED_PATCH | Freq: Every day | TRANSDERMAL | Status: DC
  Administered 2012-06-07 – 2012-06-15 (×8): 21 mg via TRANSDERMAL
  Filled 2012-06-06 (×12): qty 1

## 2012-06-06 MED ORDER — THIAMINE HCL 100 MG/ML IJ SOLN: 100.0000 mg | Freq: Every day | INTRAMUSCULAR | Status: DC

## 2012-06-06 MED ORDER — LORAZEPAM 1 MG PO TABS
1.0000 mg | ORAL_TABLET | Freq: Four times a day (QID) | ORAL | Status: DC | PRN
  Administered 2012-06-06: 1 mg via ORAL
  Filled 2012-06-06: qty 1

## 2012-06-06 MED ORDER — ONDANSETRON HCL 8 MG PO TABS: 4.0000 mg | ORAL_TABLET | Freq: Three times a day (TID) | ORAL | Status: DC | PRN

## 2012-06-06 MED ORDER — LORAZEPAM 1 MG PO TABS
0.0000 mg | ORAL_TABLET | Freq: Four times a day (QID) | ORAL | Status: DC
  Administered 2012-06-06: 1 mg via ORAL
  Filled 2012-06-06: qty 1

## 2012-06-06 MED ORDER — VITAMIN B-1 100 MG PO TABS
100.0000 mg | ORAL_TABLET | Freq: Every day | ORAL | Status: DC
  Administered 2012-06-07 – 2012-06-15 (×9): 100 mg via ORAL
  Filled 2012-06-06 (×11): qty 1

## 2012-06-06 MED ORDER — MOMETASONE FURO-FORMOTEROL FUM 100-5 MCG/ACT IN AERO
2.0000 | INHALATION_SPRAY | Freq: Two times a day (BID) | RESPIRATORY_TRACT | Status: DC
  Administered 2012-06-06: 2 via RESPIRATORY_TRACT
  Filled 2012-06-06: qty 8.8

## 2012-06-06 MED ORDER — FOLIC ACID 1 MG PO TABS
1.0000 mg | ORAL_TABLET | Freq: Every day | ORAL | Status: DC
  Administered 2012-06-06: 1 mg via ORAL
  Filled 2012-06-06: qty 1

## 2012-06-06 MED ORDER — MOMETASONE FURO-FORMOTEROL FUM 100-5 MCG/ACT IN AERO
2.0000 | INHALATION_SPRAY | Freq: Two times a day (BID) | RESPIRATORY_TRACT | Status: DC
  Administered 2012-06-07 – 2012-06-15 (×17): 2 via RESPIRATORY_TRACT
  Filled 2012-06-06 (×2): qty 8.8

## 2012-06-06 MED ORDER — HYDROXYZINE HCL 25 MG PO TABS
25.0000 mg | ORAL_TABLET | Freq: Four times a day (QID) | ORAL | Status: AC | PRN
  Administered 2012-06-06: 25 mg via ORAL

## 2012-06-06 MED ORDER — CHLORDIAZEPOXIDE HCL 25 MG PO CAPS
50.0000 mg | ORAL_CAPSULE | Freq: Once | ORAL | Status: AC
Start: 2012-06-06 — End: 2012-06-06
  Administered 2012-06-06: 50 mg via ORAL
  Filled 2012-06-06: qty 2

## 2012-06-06 MED ORDER — ADULT MULTIVITAMIN W/MINERALS CH
1.0000 | ORAL_TABLET | Freq: Every day | ORAL | Status: DC
  Administered 2012-06-06 – 2012-06-15 (×10): 1 via ORAL
  Filled 2012-06-06 (×12): qty 1

## 2012-06-06 MED ORDER — ONDANSETRON 4 MG PO TBDP: 4.0000 mg | ORAL_TABLET | Freq: Four times a day (QID) | ORAL | Status: AC | PRN

## 2012-06-06 MED ORDER — ADULT MULTIVITAMIN W/MINERALS CH
1.0000 | ORAL_TABLET | Freq: Every day | ORAL | Status: DC
  Administered 2012-06-06: 1 via ORAL
  Filled 2012-06-06: qty 1

## 2012-06-06 MED ORDER — CHLORDIAZEPOXIDE HCL 25 MG PO CAPS: 25.0000 mg | ORAL_CAPSULE | Freq: Four times a day (QID) | ORAL | Status: AC | PRN

## 2012-06-06 MED ORDER — CHLORDIAZEPOXIDE HCL 25 MG PO CAPS
25.0000 mg | ORAL_CAPSULE | Freq: Three times a day (TID) | ORAL | Status: AC
  Administered 2012-06-08 (×3): 25 mg via ORAL
  Filled 2012-06-06 (×3): qty 1

## 2012-06-06 MED ORDER — LORAZEPAM 1 MG PO TABS: 0.0000 mg | ORAL_TABLET | Freq: Two times a day (BID) | ORAL | Status: DC

## 2012-06-06 MED ORDER — ACETAMINOPHEN 325 MG PO TABS
650.0000 mg | ORAL_TABLET | Freq: Four times a day (QID) | ORAL | Status: DC | PRN
  Administered 2012-06-07 – 2012-06-09 (×2): 650 mg via ORAL

## 2012-06-06 MED ORDER — MAGNESIUM HYDROXIDE 400 MG/5ML PO SUSP
30.0000 mL | Freq: Every day | ORAL | Status: DC | PRN
  Administered 2012-06-10: 30 mL via ORAL

## 2012-06-06 MED ORDER — CHLORDIAZEPOXIDE HCL 25 MG PO CAPS
25.0000 mg | ORAL_CAPSULE | ORAL | Status: AC
  Administered 2012-06-09 (×2): 25 mg via ORAL
  Filled 2012-06-06 (×2): qty 1

## 2012-06-06 MED ORDER — NICOTINE 21 MG/24HR TD PT24
21.0000 mg | MEDICATED_PATCH | Freq: Every day | TRANSDERMAL | Status: DC
  Administered 2012-06-06: 21 mg via TRANSDERMAL
  Filled 2012-06-06: qty 1

## 2012-06-06 MED ORDER — VITAMIN B-1 100 MG PO TABS
100.0000 mg | ORAL_TABLET | Freq: Every day | ORAL | Status: DC
  Administered 2012-06-06: 100 mg via ORAL
  Filled 2012-06-06: qty 1

## 2012-06-06 MED ORDER — ALPRAZOLAM 0.25 MG PO TABS: 0.2500 mg | ORAL_TABLET | Freq: Every evening | ORAL | Status: DC | PRN

## 2012-06-06 MED ORDER — ZOLPIDEM TARTRATE 5 MG PO TABS: 5.0000 mg | ORAL_TABLET | Freq: Every evening | ORAL | Status: DC | PRN

## 2012-06-06 MED ORDER — LOPERAMIDE HCL 2 MG PO CAPS: 2.0000 mg | ORAL_CAPSULE | ORAL | Status: AC | PRN

## 2012-06-06 NOTE — ED Notes (Signed)
Receiving RN would like patient to be transferred to Adult [facility] around 1500.

## 2012-06-06 NOTE — ED Notes (Signed)
Attempted to call report to [facility].  RN asked that I call back in approx. 15 minutes.

## 2012-06-06 NOTE — ED Provider Notes (Signed)
History     CSN: [id number]  Arrival date & time 06/06/12  4098   First MD Initiated Contact with Patient 06/06/12 0848      Chief Complaint  Patient presents with  . Alcohol Problem     The history is provided by the patient.   patient with a long-standing history of alcohol abuse.  He's drinking up to 2/5 of liquor a day.  His last drink was last night.  This morning he awoke and stated he finally he was tired of drinking and wanted help.  He presents here requesting alcohol detox.  He feels slightly "shaky" today.  No seizures.  No hallucinations.  Patient has no other complaints.  He has a history of asthma, HIV, anxiety, depression.  Past Medical History  Diagnosis Date  . Asthma   . HIV (human immunodeficiency virus infection)   . Mental disorder   . Anxiety   . Depression   . Shortness of breath     Past Surgical History  Procedure Laterality Date  . Skin graft full thickness leg      POSTERIOR LEFT LEG  AFTER DOG BITES    No family history on file.  History  Substance Use Topics  . Smoking status: Current Every Day Smoker -- 0.50 packs/day for 25 years    Types: Cigarettes  . Smokeless tobacco: Never Used  . Alcohol Use: 0.0 oz/week     Comment: drinking 1.5 fifths of vodka, beer 12-pack /day      Review of Systems  All other systems reviewed and are negative.    Allergies  Shellfish allergy  Home Medications   Current Outpatient Rx  Name  Route  Sig  Dispense  Refill  . albuterol (PROVENTIL HFA;VENTOLIN HFA) 108 (90 BASE) MCG/ACT inhaler   Inhalation   Inhale 2 puffs into the lungs every 6 (six) hours as needed for wheezing.         Marland Kitchen ALPRAZolam (XANAX) 0.25 MG tablet   Oral   Take 1 tablet (0.25 mg total) by mouth at bedtime as needed for sleep.   15 tablet   0   . Fluticasone-Salmeterol (ADVAIR DISKUS) 100-50 MCG/DOSE AEPB   Inhalation   Inhale 1 puff into the lungs daily.   60 each   12   . predniSONE (DELTASONE) 20 MG tablet  Oral   Take 20-40 mg by mouth daily as needed (for shortness of breath).           BP 123/82  Pulse 77  Temp(Src) 97.6 F (36.4 C) (Oral)  Resp 22  SpO2 98%  Physical Exam  Nursing note and vitals reviewed. Constitutional: He is oriented to person, place, and time. He appears well-developed and well-nourished.  HENT:  Head: Normocephalic and atraumatic.  Eyes: EOM are normal.  Neck: Normal range of motion.  Cardiovascular: Normal rate, regular rhythm, normal heart sounds and intact distal pulses.   Pulmonary/Chest: Effort normal and breath sounds normal. No respiratory distress.  Abdominal: Soft. He exhibits no distension. There is no tenderness.  Musculoskeletal: Normal range of motion.  Neurological: He is alert and oriented to person, place, and time.  Skin: Skin is warm and dry.  Psychiatric: He has a normal mood and affect. Judgment normal.    ED Course  Procedures (including critical care time)  Labs Reviewed  CBC  COMPREHENSIVE METABOLIC PANEL  ETHANOL  URINE RAPID DRUG SCREEN (HOSP PERFORMED)   No results found.   No diagnosis found.  MDM  Patient reports alcohol abuse.  Patient appears to have early alcohol withdrawal.  Oral Ativan now based on alcohol withdrawal protocol.  ACT Team to evaluate the patient        Lyanne Co, MD 06/06/12 [phone]

## 2012-06-06 NOTE — Tx Team (Signed)
Initial Interdisciplinary Treatment Plan  PATIENT STRENGTHS: (choose at least two) Average or above average intelligence Capable of independent living General fund of knowledge Supportive family/friends  PATIENT STRESSORS: Financial difficulties Health problems Medication change or noncompliance Occupational concerns Substance abuse   PROBLEM LIST: Problem List/Patient Goals Date to be addressed Date deferred Reason deferred Estimated date of resolution  Depression 06-06-12     Alcohol abuse 06-06-12     Previous SI 06-06-12     Mood stabilization 06-06-12                                    DISCHARGE CRITERIA:  Ability to meet basic life and health needs Improved stabilization in mood, thinking, and/or behavior Motivation to continue treatment in a less acute level of care Verbal commitment to aftercare and medication compliance Withdrawal symptoms are absent or subacute and managed without 24-hour nursing intervention  PRELIMINARY DISCHARGE PLAN: Attend 12-step recovery group Return to previous living arrangement  PATIENT/FAMIILY INVOLVEMENT: This treatment plan has been presented to and reviewed with the patient, John [name].  The patient and family have been given the opportunity to ask questions and make suggestions.  Cranford Mon 06/06/2012, 6:03 PM

## 2012-06-06 NOTE — Progress Notes (Signed)
Patient ID: John [name], male   DOB: 21-Apr-1970, 42 y.o.   MRN: [id number] Patient is a 42 yo male admitted to [facility] for alcohol abuse.  Patient is voluntary and requesting detox from alcohol.  He reports that he is a daily drinker and is drinking 1/5 liquor daily.  He is having withdrawal symptoms of tremors, sweating, nausea/vomiting.  He has blackouts when he drinks and has two previous episodes of SI by walking into traffic or jumping off a bridge.  He denies any SI/HI today, however, he reports hearing voices that talk to each other and seeing people's faces.  He has had a previous admission here for depression.  He reports depression, hopelessness, and insomnia.  His medical hx as follows: HIV+, asthma, allergies and he is allergic to shellfish.  He currently resides with his aunt and she is supportive.  Patient has been unemployed for the past year.  Patient was oriented to room and unit.

## 2012-06-06 NOTE — ED Notes (Signed)
ACT TEAM AT BEDSIDE 

## 2012-06-06 NOTE — BH Assessment (Addendum)
Assessment Note  Update:  Received call from [facility] stating pt accepted by PA Lord to Dr. Dub Mikes to bed 301-1 and that pt could be transported to [facility].  Updated EDP Campos and ED staff.  Completed support paperwork, updated assessment disposition, and faxed to [facility] to log.  Pt to be transported via security to [facility] as pt is voluntary.    Disposition:  Disposition Initial Assessment Completed for this Encounter: Yes Disposition of Patient: Inpatient treatment program Type of inpatient treatment program: Adult Patient referred to: Other (Comment) (Pt accepted [facility])  On Site Evaluation by:   Reviewed with Physician:  Elsie Lincoln, Rennis Harding 06/06/2012 1:41 PM
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
## 2012-06-06 NOTE — ED Notes (Signed)
PATIENT STATES HE HAS NOT TRIED TO DETOX OFF ETOH BEFORE. STATES HE DRINKS A HALF GALLON OF VODKA DAILY.HIS LAST DRINK WAS AROUND 8PM. STATES DRINKING SINCE AGE 42. STATES HE USUALLY WAKES UP SICK AND HAS TREMORS IF HE DOESN'T GET A DRINK. STATES HE LIVES WITH HIS AUNT AND WILL HAVE A SUPPORT SYSTEM WHEN HE GETS OUT OF DETOX. ALSO STATES HE SMOKES MARIJUANA. DENIES OTHER DRUG USE. PT CALM AND COOPERATIVE.

## 2012-06-06 NOTE — ED Notes (Signed)
Pt here requesting "detox" from his drinking alcohol.  Pt states he drinks liquor daily and has been since he was 42 years old.

## 2012-06-06 NOTE — Progress Notes (Signed)
Pt did not attend AA group  

## 2012-06-07 DIAGNOSIS — F329 Major depressive disorder, single episode, unspecified: Secondary | ICD-10-CM

## 2012-06-07 DIAGNOSIS — F1020 Alcohol dependence, uncomplicated: Secondary | ICD-10-CM

## 2012-06-07 DIAGNOSIS — F331 Major depressive disorder, recurrent, moderate: Secondary | ICD-10-CM | POA: Diagnosis present

## 2012-06-07 MED ORDER — CITALOPRAM HYDROBROMIDE 20 MG PO TABS
20.0000 mg | ORAL_TABLET | Freq: Every day | ORAL | Status: DC
  Administered 2012-06-07 – 2012-06-15 (×9): 20 mg via ORAL
  Filled 2012-06-07 (×3): qty 1
  Filled 2012-06-07: qty 14
  Filled 2012-06-07 (×5): qty 1
  Filled 2012-06-07: qty 14
  Filled 2012-06-07 (×3): qty 1

## 2012-06-07 MED ORDER — ENSURE COMPLETE PO LIQD
237.0000 mL | Freq: Two times a day (BID) | ORAL | Status: DC
  Administered 2012-06-08 – 2012-06-14 (×8): 237 mL via ORAL

## 2012-06-07 NOTE — Tx Team (Signed)
Interdisciplinary Treatment Plan Update (Adult)  Date: 06/07/2012  Time Reviewed: 10:19 AM   Progress in Treatment: Attending groups: Not as yet Participating in groups: No Taking medication as prescribed:  Yes Tolerating medication:  Yes Family/Significant othe contact made: No Patient understands diagnosis: Yes Discussing patient identified problems/goals with staff: Yes Medical problems stabilized or resolved:  Yes Denies suicidal/homicidal ideation: No Patient has not harmed self or Others: Yes  New problem(s) identified: None Identified  Discharge Plan or Barriers:  Patient reports he follows up at [facility] and would like to return there   Additional comments: N/A  Reason for Continuation of Hospitalization: Anxiety  Depression Hallucinations Withdrawal symptoms   Estimated length of stay: 3-5 days  For review of initial/current patient goals, please see plan of care.  Attendees: Patient:     Family:  Serena Colonel, PA   Physician:   06/07/2012 10:19 AM   Nursing:   Roswell Miners. RN 06/07/2012 10:19 AM   Clinical Social Worker Ronda Fairly 06/07/2012 10:19 AM   Other:   Robbie Louis, RN 06/07/2012 10:19 AM   Other:  Liliane Bade, Community Care Coordinator 06/07/2012 10:19 AM   Other:   06/07/2012 10:19 AM   Other:   06/07/2012 10:19 AM    Scribe for Treatment Team:   Carney Bern, LCSWA  06/07/2012 10:19 AM

## 2012-06-07 NOTE — H&P (Signed)
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 Admitted to [facility] from the [facility] hospital ED with complaints of increased alcohol consumption with requests for detoxification treatment. Patient reports, "I took the city bus to the [facility] ED yesterday. I needed alcohol and marijuana detox. I started drinking alcohol at 15 and smoking weed at 17. I drink a half gallon of vodka daily. Drinking helps me deal with my problems. It helps my depression, this way, I don't think or worry about stuff. I have been in so much trouble with the law when intoxicated. I have had charges ranging from breaking/entering, stealing tuff to sell to make money for my liquor. I would like to go to a residential treatment place after detox. My depression right now is at #10. I am HIV positive. I was diagnosed few years ago. I am in a program here in [place] where I'm being monitored to see how my system is doing. I was told that right now, I do not need any treatment yet. I do hear voices conversing inside my head all the time".  Elements:  Location:  [facility] adult unit. Quality:  Alcohol cravings, anxiety. Severity:  Drinks half a gallon of Vodka daily. Timing:  Been drinking excessively x 2 weeks.. Duration:  Started drinking at age 46. Context:  Stealing, Breaking and entering, resisting arrests, strained relationships.  Associated Signs/Synptoms:  Depression Symptoms:  depressed mood, hopelessness,  (Hypo) Manic Symptoms:  Irritable Mood,  Anxiety Symptoms:  Excessive Worry,  Psychotic Symptoms:  Hallucinations: Auditory  PTSD Symptoms: Had a traumatic exposure:  None reported  Psychiatric Specialty  Exam: Physical Exam  Constitutional: He is oriented to person, place, and time. He appears well-developed.  HENT:  Head: Normocephalic.  Eyes: Pupils are equal, round, and reactive to light.  Neck: Normal range of motion.  Cardiovascular: Normal rate.   Respiratory: Effort normal.  GI: Soft.  Musculoskeletal: Normal range of motion.  Neurological: He is alert and oriented to person, place, and time.  Skin: Skin is warm and dry.  Psychiatric: His speech is normal and behavior is normal. Judgment and thought content normal. His mood appears anxious. Cognition and memory are normal. He exhibits a depressed mood.    Review of Systems  Constitutional: Negative.   HENT: Negative.   Eyes: Negative.   Respiratory: Negative.   Cardiovascular: Negative.   Gastrointestinal: Negative.   Genitourinary: Negative.   Musculoskeletal: Negative.   Skin: Negative.   Neurological: Negative.   Endo/Heme/Allergies: Negative.   Psychiatric/Behavioral: Positive for depression, hallucinations and substance abuse. Negative for suicidal ideas and memory loss. The patient is nervous/anxious and has insomnia.     Blood pressure 125/85, pulse 97, temperature 98.6 F (37 C), temperature source Oral, resp. rate 16, height 5\' 7"  (1.702 m), weight 79.833 kg (176 lb).Body mass index is 27.56 kg/(m^2).  General Appearance: Disheveled  Eye Solicitor::  Fair  Speech:  Clear and Coherent  Volume:  Normal  Mood:  Depressed and rated depression at #10  Affect:  Flat  Thought Process:  Coherent and Intact  Orientation:  Full (Time, Place, and Person)  Thought Content:  Hallucinations: Auditory and Rumination  Suicidal Thoughts:  No  Homicidal Thoughts:  No  Memory:  Immediate;  Good Recent;   Good Remote;   Good  Judgement:  Poor  Insight:  Shallow  Psychomotor Activity:  Normal  Concentration:  Fair  Recall:  Fair  Akathisia:  No  Handed:  Right  AIMS (if indicated):     Assets:  Desire for Improvement   Sleep:  Number of Hours: 5.5    Past Psychiatric History: Diagnosis: Alcohol dependence, alcohol abuse with intoxication  Hospitalizations: [facility]  Outpatient Care: [facility]  Substance Abuse Care: None reported  Self-Mutilation: Denies  Suicidal Attempts: Denies attempts and or thoughts.  Violent Behaviors: None reported   Past Medical History:   Past Medical History  Diagnosis Date  . Asthma   . HIV (human immunodeficiency virus infection)   . Mental disorder   . Anxiety   . Depression   . Shortness of breath    Cardiac History:  Asthma  Allergies:   Allergies  Allergen Reactions  . Shellfish Allergy Anaphylaxis and Swelling   PTA Medications: Prescriptions prior to admission  Medication Sig Dispense Refill  . albuterol (PROVENTIL HFA;VENTOLIN HFA) 108 (90 BASE) MCG/ACT inhaler Inhale 2 puffs into the lungs every 6 (six) hours as needed for shortness of breath.       . ALPRAZolam (XANAX) 0.25 MG tablet Take 1 tablet (0.25 mg total) by mouth at bedtime as needed for sleep.  15 tablet  0  . Fluticasone-Salmeterol (ADVAIR) 100-50 MCG/DOSE AEPB Inhale 1 puff into the lungs every morning.      . predniSONE (DELTASONE) 20 MG tablet Take 20-40 mg by mouth daily as needed (For shortness of breath.).       . [DISCONTINUED] Fluticasone-Salmeterol (ADVAIR DISKUS) 100-50 MCG/DOSE AEPB Inhale 1 puff into the lungs daily.  60 each  12    Previous Psychotropic Medications:  Medication/Dose  See medication lists               Substance Abuse History in the last 12 months:  yes  Consequences of Substance Abuse: Medical Consequences:  Liver damage, Possible death by overdose Legal Consequences:  Arrests, jail time, Loss of driving privilege. Family Consequences:  Family discord, divorce and or separation.  Social History:  reports that he has been smoking Cigarettes.  He has a 12.5 pack-year smoking history. He has never used smokeless tobacco. He reports that  drinks alcohol.  He reports that he uses illicit drugs (Marijuana) about 7 times per week. Additional Social History: Pain Medications: none Prescriptions: see PTA Over the Counter: none History of alcohol / drug use?: Yes Longest period of sobriety (when/how long): none Negative Consequences of Use: Financial Withdrawal Symptoms: Agitation;Blackouts;Sweats;Patient aware of relationship between substance abuse and physical/medical complications;Nausea / Vomiting;Tremors Name of Substance 1: etoh 1 - Age of First Use: 15 1 - Frequency: daily 1 - Last Use / Amount: 06/05/12  Social History:  Current Place of Residence: [place]   Place of Birth: [place]   Family Members: "My only child, a daughter was killed on easter 08/05/2022; drive-by shooting"   Marital Status: Single   Children: 0   Sons:0   Daughters: 1 (Deceased, easter Aug 05, 2022 by gun shot wound to her back)   Relationships: "I'm single"   Education: No high school diploma   Educational Problems/Performance: "I did not complete high school"   Religious Beliefs/Practices: None reported   History of Abuse (Emotional/Phsycial/Sexual): None reported   Occupational Experiences: Unemployed   Military History: None.   Legal History: None reported   Hobbies/Interests:None reported   Family  History: No family history on file.   Family History:  History reviewed. No pertinent family history.  Results for orders placed during the hospital encounter of 06/06/12 (from the past 72 hour(s))  CBC     Status: None   Collection Time    06/06/12  9:24 AM      Result Value Range   WBC 5.0  4.0 - 10.5 K/uL   RBC 4.55  4.22 - 5.81 MIL/uL   Hemoglobin 14.9  13.0 - 17.0 g/dL   HCT 30.8  65.7 - 84.6 %   MCV 93.2  78.0 - 100.0 fL   MCH 32.7  26.0 - 34.0 pg   MCHC 35.1  30.0 - 36.0 g/dL   RDW 96.2  95.2 - 84.1 %   Platelets 244  150 - 400 K/uL  COMPREHENSIVE METABOLIC PANEL     Status: Abnormal   Collection Time    06/06/12  9:24 AM       Result Value Range   Sodium 143  135 - 145 mEq/L   Potassium 3.8  3.5 - 5.1 mEq/L   Chloride 103  96 - 112 mEq/L   CO2 25  19 - 32 mEq/L   Glucose, Bld 91  70 - 99 mg/dL   BUN 9  6 - 23 mg/dL   Creatinine, Ser 3.24  0.50 - 1.35 mg/dL   Calcium 9.0  8.4 - 40.1 mg/dL   Total Protein 7.4  6.0 - 8.3 g/dL   Albumin 3.7  3.5 - 5.2 g/dL   AST 027 (*) 0 - 37 U/L   ALT 115 (*) 0 - 53 U/L   Alkaline Phosphatase 109  39 - 117 U/L   Total Bilirubin 0.3  0.3 - 1.2 mg/dL   GFR calc non Af Amer >90  >90 mL/min   GFR calc Af Amer >90  >90 mL/min   Comment:            The eGFR has been calculated     using the CKD EPI equation.     This calculation has not been     validated in all clinical     situations.     eGFR's persistently     <90 mL/min signify     possible Chronic Kidney Disease.  ETHANOL     Status: Abnormal   Collection Time    06/06/12  9:24 AM      Result Value Range   Alcohol, Ethyl (B) 46 (*) 0 - 11 mg/dL   Comment:            LOWEST DETECTABLE LIMIT FOR     SERUM ALCOHOL IS 11 mg/dL     FOR MEDICAL PURPOSES ONLY  URINE RAPID DRUG SCREEN (HOSP PERFORMED)     Status: Abnormal   Collection Time    06/06/12 11:53 AM      Result Value Range   Opiates NONE DETECTED  NONE DETECTED   Cocaine NONE DETECTED  NONE DETECTED   Benzodiazepines NONE DETECTED  NONE DETECTED   Amphetamines NONE DETECTED  NONE DETECTED   Tetrahydrocannabinol POSITIVE (*) NONE DETECTED   Barbiturates NONE DETECTED  NONE DETECTED   Comment:            DRUG SCREEN FOR MEDICAL PURPOSES     ONLY.  IF CONFIRMATION IS NEEDED     FOR ANY PURPOSE, NOTIFY LAB     WITHIN 5 DAYS.  LOWEST DETECTABLE LIMITS     FOR URINE DRUG SCREEN     Drug Class       Cutoff (ng/mL)     Amphetamine      1000     Barbiturate      200     Benzodiazepine   200     Tricyclics       300     Opiates          300     Cocaine          300     THC              50   Psychological Evaluations:  Assessment:    AXIS I:  Alcohol dependence, Alcohol abuse with intoxication, Major depression AXIS II:  Deferred AXIS III:   Past Medical History  Diagnosis Date  . Asthma   . HIV (human immunodeficiency virus infection)   . Mental disorder   . Anxiety   . Depression   . Shortness of breath    AXIS IV:  other psychosocial or environmental problems, Homelessness, AXIS V:  1-10 persistent dangerousness to self and others present  Treatment Plan/Recommendations: 1. Admit for crisis management and stabilization, estimated length of stay 3-5 days.  2. Medication management to reduce current symptoms to base line and improve the patient's overall level of functioning  3. Treat health problems as indicated.  4. Develop treatment plan to decrease risk of relapse upon discharge and the need for readmission.  5. Psycho-social education regarding relapse prevention and self care.  6. Health care follow up as needed for medical problems.  7. Review, reconcile, and reinstate any pertinent home medications for other health issues where appropriate. 8. Call for consults with hospitalist for any additional specialty patient care services as needed.  Treatment Plan Summary: Daily contact with patient to assess and evaluate symptoms and progress in treatment Medication management  Current Medications:  Current Facility-Administered Medications  Medication Dose Route Frequency Provider Last Rate Last Dose  . acetaminophen (TYLENOL) tablet 650 mg  650 mg Oral Q6H PRN Nanine Means, NP      . albuterol (PROVENTIL HFA;VENTOLIN HFA) 108 (90 BASE) MCG/ACT inhaler 2 puff  2 puff Inhalation Q6H PRN Nanine Means, NP      . alum & mag hydroxide-simeth (MAALOX/MYLANTA) 200-200-20 MG/5ML suspension 30 mL  30 mL Oral Q4H PRN Nanine Means, NP      . chlordiazePOXIDE (LIBRIUM) capsule 25 mg  25 mg Oral Q6H PRN Nanine Means, NP      . chlordiazePOXIDE (LIBRIUM) capsule 25 mg  25 mg Oral QID Nanine Means, NP   25 mg at 06/07/12  0829   Followed by  . [START ON 06/08/2012] chlordiazePOXIDE (LIBRIUM) capsule 25 mg  25 mg Oral TID Nanine Means, NP       Followed by  . [START ON 06/09/2012] chlordiazePOXIDE (LIBRIUM) capsule 25 mg  25 mg Oral BH-qamhs Nanine Means, NP       Followed by  . [START ON 06/10/2012] chlordiazePOXIDE (LIBRIUM) capsule 25 mg  25 mg Oral Daily Nanine Means, NP      . hydrOXYzine (ATARAX/VISTARIL) tablet 25 mg  25 mg Oral Q6H PRN Nanine Means, NP   25 mg at 06/06/12 2322  . loperamide (IMODIUM) capsule 2-4 mg  2-4 mg Oral PRN Nanine Means, NP      . magnesium hydroxide (MILK OF MAGNESIA) suspension 30 mL  30 mL Oral Daily PRN  Nanine Means, NP      . mometasone-formoterol [facility]) 100-5 MCG/ACT inhaler 2 puff  2 puff Inhalation BID Nanine Means, NP   2 puff at 06/07/12 0829  . multivitamin with minerals tablet 1 tablet  1 tablet Oral Daily Nanine Means, NP   1 tablet at 06/07/12 0829  . nicotine (NICODERM CQ - dosed in mg/24 hours) patch 21 mg  21 mg Transdermal Q0600 Nanine Means, NP   21 mg at 06/07/12 1610  . ondansetron (ZOFRAN-ODT) disintegrating tablet 4 mg  4 mg Oral Q6H PRN Nanine Means, NP      . thiamine (VITAMIN B-1) tablet 100 mg  100 mg Oral Daily Nanine Means, NP   100 mg at 06/07/12 0829  . traZODone (DESYREL) tablet 50 mg  50 mg Oral QHS,MR X 1 Kerry Hough, PA-C        Observation Level/Precautions:  15 minute checks  Laboratory:  Reviewed ED lab findings on file  Psychotherapy:  Group sessions  Medications: See medication lists   Consultations:  As needed  Discharge Concerns:  Maintaining sobreity  Estimated LOS: 5-7 days  Other:     I certify that inpatient services furnished can reasonably be expected to improve the patient's condition.   Thedore Mins, MD 3/26/20149:09 AM

## 2012-06-07 NOTE — Progress Notes (Signed)
[facility] LCSW Aftercare Discharge Planning Group Note  06/07/2012 8:45 AM   Participation Quality:  Did not attend, patient was recently admitted and still sleeping    Harrill, Julious Payer

## 2012-06-07 NOTE — Progress Notes (Signed)
Adult Psychoeducational Group Note  Date:  06/07/2012 Time:  11:40 AM  Group Topic/Focus:  Coping With Mental Health Crisis:   The purpose of this group is to help patients identify strategies for coping with mental health crisis.  Group discusses possible causes of crisis and ways to manage them effectively.  Participation Level:  Active  Participation Quality:  Appropriate, Attentive and Sharing  Affect:  Appropriate  Cognitive:  Appropriate  Insight: Appropriate  Engagement in Group:  Engaged  Modes of Intervention:  Discussion  Additional Comments:  Pt was appropriate and sharing while attending group. Pt stated that depression is what caused him to go into a crisis. Pt was then removed from group by staff.   Sharyn Lull 06/07/2012, 11:40 AM

## 2012-06-07 NOTE — [facility] Suicide Risk Assessment (Signed)
Suicide Risk Assessment  Admission Assessment     Nursing information obtained from:    Demographic factors:    Current Mental Status:    Loss Factors:    Historical Factors:    Risk Reduction Factors:     CLINICAL FACTORS:   Depression:   Aggression Comorbid alcohol abuse/dependence Hopelessness Impulsivity Insomnia Alcohol/Substance Abuse/Dependencies Previous Psychiatric Diagnoses and Treatments  COGNITIVE FEATURES THAT CONTRIBUTE TO RISK:  Closed-mindedness    SUICIDE RISK:   Minimal: No identifiable suicidal ideation.  Patients presenting with no risk factors but with morbid ruminations; may be classified as minimal risk based on the severity of the depressive symptoms  PLAN OF CARE:1. Admit for crisis management and stabilization. 2. Medication management to reduce current symptoms to base line and improve the  patient's overall level of functioning 3. Treat health problems as indicated. 4. Develop treatment plan to decrease risk of relapse upon discharge and the need for  readmission. 5. Psycho-social education regarding relapse prevention and self care. 6. Health care follow up as needed for medical problems. 7. Restart home medications where appropriate.   I certify that inpatient services furnished can reasonably be expected to improve the patient's condition.  Lanny Donoso,MD 06/07/2012, 11:06 AM

## 2012-06-07 NOTE — Progress Notes (Signed)
Nutrition Brief Note  Patient identified on the Malnutrition Screening Tool (MST) Report  Body mass index is 27.56 kg/(m^2). Patient meets criteria for overweight based on current BMI.   Wt Readings from Last 10 Encounters:  06/06/12 176 lb (79.833 kg)  05/03/12 177 lb 12 oz (80.627 kg)  04/26/12 172 lb 13.5 oz (78.4 kg)  04/18/12 175 lb (79.379 kg)  01/26/12 150 lb (68.04 kg)  01/20/12 179 lb 8 oz (81.421 kg)  01/05/12 180 lb (81.647 kg)  12/31/11 183 lb 8 oz (83.235 kg)  09/26/11 160 lb (72.576 kg)  08/30/11 171 lb 8 oz (77.792 kg)    Patient states that he has lost 25 lbs in the last 3 months.  Medical record does not indicate this loss however patient with decreased appetite and increased ETOH during this time.  Patient with hx of HIV.    Current diet order is regular, patient is consuming approximately fair% of meals at this time. Labs and medications reviewed.   AST/ALT elevated.  Will add Ensure Complete bid.  Encouraged intake.  No nutrition interventions warranted at this time. If nutrition issues arise, please consult RD.   Oran Rein, RD, LDN Clinical Inpatient Dietitian Pager:  [phone] Weekend and after hours pager:  [phone]

## 2012-06-07 NOTE — Progress Notes (Signed)
Pt has been in bed all day.  He has not been up except for meals and medications today.  He rated his depression and hopelessness a 10 and his anxiety a 9 on his self-inventory.  He denied any S/H ideations or A/V hallucinations.  He wants to get back in at [facility] for his follow up care.  He c/o of back pain at 1732 and requested tylenol so given.  Discussed with him that he should try and stay out of the bed more often to see if that would help.  He nodded his head and walked away.  He went to dinner and came back and went straight back to bed and was asleep upon follow-up to his tylenol.

## 2012-06-08 DIAGNOSIS — F333 Major depressive disorder, recurrent, severe with psychotic symptoms: Secondary | ICD-10-CM

## 2012-06-08 NOTE — Progress Notes (Addendum)
D:  Patient's self inventory sheet, patient has fair sleep, good appetite, low energy level, improving attention span.  Rated depression and anxiety #8.  Hopelessness #7.  Has experienced tremors and chilling.  Denied SI.  Has experienced blurred vision.   Zero pain goal.  Worst pain #1.  After discharge, plans to stop drinking.  Denied SI and HI.   Denied A/V hallucinations.  Does have discharge plans.  No problems taking meds after discharge. A:  Medications administered per MD order.  Support and encouragement given throughout day.   R:  Denied SI and HI.  Denied A/V hallucinations.  Contracts for safety. Patient stated he wants to go to Rehab on [address].

## 2012-06-08 NOTE — Progress Notes (Signed)
[facility] LCSW Aftercare Discharge Planning Group Note  06/08/2012 8:45 AM  Participation Quality:  Did Not Attend  John [name]   

## 2012-06-08 NOTE — Progress Notes (Signed)
[facility] LCSW Group Therapy  06/08/2012 1:15 PM  Type of Therapy:  Group Therapy 1:15 top 2:30 PM  Participation Level:  Did Not Attend   Harrill, Catherine Campbell   

## 2012-06-08 NOTE — Progress Notes (Signed)
Patient ID: John [name], male   DOB: July 06, 1970, 42 y.o.   MRN: [id number] D: Pt. Lying in bed, tremors note upon extended arms. Pt. Reports day been "1/2 and 1/2" Pt. Denies SHI, no AVH. Pt. Laughing at times.  A: Writer encouraged pt. To continue to move forward in recovery and verbalize needs. Pt. Encouraged to go to [facility]. Staff will monitor q90min for safety. R: Pt. Is safe on the unit. Pt. Went to [facility].

## 2012-06-08 NOTE — Progress Notes (Signed)
Patient ID: John [name], male   DOB: [birth year], 42 y.o.   MRN: [id number] D: pt. Reports day been "tiresome". Reports depression at "10".  Pt. Wants to go to [facility]. A: Writer introduced self to client and reviewed night meds. Writer encouraged group. R: Staff will monitor q69min for safety. Pt. Is safe on the unit. Pt. Refused group.

## 2012-06-08 NOTE — Progress Notes (Signed)
[facility] LCSW Group Therapy - Late Entry  06/07/2012 1:15 PM  Type of Therapy: Group Therapy 1:15 to 2:30 PM  Participation Level: Did Not Attend   Harrill, Catherine Campbell 

## 2012-06-08 NOTE — Progress Notes (Signed)
[facility] MD Progress Note  06/08/2012 5:59 PM John [name]  MRN:  [id number] Subjective:  "I'm stressed." Diagnosis: MDD recurrent severe w/psychotic features                     Alcohol dependence  ADL's:  Intact  Sleep: Fair  Appetite:  Good  Suicidal Ideation:  denies Homicidal Ideation:  denies AEB (as evidenced by):Patient's report of decreasing symptoms, improved mood, sleep, appetite, affect, and participation in unit programming.  Psychiatric Specialty Exam: Review of Systems  Constitutional: Negative.  Negative for fever, chills, weight loss, malaise/fatigue and diaphoresis.  HENT: Negative for congestion and sore throat.   Eyes: Negative for blurred vision, double vision and photophobia.  Respiratory: Negative for cough, shortness of breath and wheezing.   Cardiovascular: Negative for chest pain, palpitations and PND.  Gastrointestinal: Negative for heartburn, nausea, vomiting, abdominal pain, diarrhea and constipation.  Musculoskeletal: Negative for myalgias, joint pain and falls.  Neurological: Negative for dizziness, tingling, tremors, sensory change, speech change, focal weakness, seizures, loss of consciousness, weakness and headaches.  Endo/Heme/Allergies: Negative for polydipsia. Does not bruise/bleed easily.  Psychiatric/Behavioral: Negative for depression, suicidal ideas, hallucinations, memory loss and substance abuse. The patient is not nervous/anxious and does not have insomnia.     Blood pressure 120/84, pulse 91, temperature 97.9 F (36.6 C), temperature source Oral, resp. rate 20, height 5\' 7"  (1.702 m), weight 79.833 kg (176 lb).Body mass index is 27.56 kg/(m^2).  General Appearance: Casual  Eye Contact::  Good  Speech:  Clear and Coherent  Volume:  Normal  Mood:  Anxious and Depressed  Affect:  Congruent  Thought Process:  Goal Directed and Linear  Orientation:  Patient states 1/3 but is not convincing.  Thought Content:  Hallucinations: Auditory   Suicidal Thoughts:  No  Homicidal Thoughts:  No  Memory:  Immediate;   Fair  Judgement:  Impaired  Insight:  Lacking  Psychomotor Activity:  Normal  Concentration:  Fair  Recall:  Fair  Akathisia:  No  Handed:  Right  AIMS (if indicated):     Assets:  Desire for Improvement  Sleep:  Number of Hours: 6.25   Current Medications: Current Facility-Administered Medications  Medication Dose Route Frequency Provider Last Rate Last Dose  . acetaminophen (TYLENOL) tablet 650 mg  650 mg Oral Q6H PRN Nanine Means, NP   650 mg at 06/07/12 1732  . albuterol (PROVENTIL HFA;VENTOLIN HFA) 108 (90 BASE) MCG/ACT inhaler 2 puff  2 puff Inhalation Q6H PRN Nanine Means, NP      . alum & mag hydroxide-simeth (MAALOX/MYLANTA) 200-200-20 MG/5ML suspension 30 mL  30 mL Oral Q4H PRN Nanine Means, NP      . chlordiazePOXIDE (LIBRIUM) capsule 25 mg  25 mg Oral Q6H PRN Nanine Means, NP      . Melene Muller ON 06/09/2012] chlordiazePOXIDE (LIBRIUM) capsule 25 mg  25 mg Oral BH-qamhs Nanine Means, NP       Followed by  . [START ON 06/10/2012] chlordiazePOXIDE (LIBRIUM) capsule 25 mg  25 mg Oral Daily Nanine Means, NP      . citalopram (CELEXA) tablet 20 mg  20 mg Oral Daily Sanjuana Kava, NP   20 mg at 06/08/12 0857  . feeding supplement (ENSURE COMPLETE) liquid 237 mL  237 mL Oral BID BM Jeoffrey Massed, RD   237 mL at 06/08/12 1425  . hydrOXYzine (ATARAX/VISTARIL) tablet 25 mg  25 mg Oral Q6H PRN Nanine Means, NP   25 mg at  06/06/12 2322  . loperamide (IMODIUM) capsule 2-4 mg  2-4 mg Oral PRN Nanine Means, NP      . magnesium hydroxide (MILK OF MAGNESIA) suspension 30 mL  30 mL Oral Daily PRN Nanine Means, NP      . mometasone-formoterol (DULERA) 100-5 MCG/ACT inhaler 2 puff  2 puff Inhalation BID Nanine Means, NP   2 puff at 06/08/12 0857  . multivitamin with minerals tablet 1 tablet  1 tablet Oral Daily Nanine Means, NP   1 tablet at 06/08/12 0857  . nicotine (NICODERM CQ - dosed in mg/24 hours) patch 21 mg  21 mg  Transdermal Q0600 Nanine Means, NP   21 mg at 06/08/12 1610  . ondansetron (ZOFRAN-ODT) disintegrating tablet 4 mg  4 mg Oral Q6H PRN Nanine Means, NP      . thiamine (VITAMIN B-1) tablet 100 mg  100 mg Oral Daily Nanine Means, NP   100 mg at 06/08/12 0858  . traZODone (DESYREL) tablet 50 mg  50 mg Oral QHS,MR X 1 Kerry Hough, PA-C   50 mg at 06/07/12 2143    Lab Results: No results found for this or any previous visit (from the past 48 hour(s)).  Physical Findings: AIMS: Facial and Oral Movements Muscles of Facial Expression: None, normal Lips and Perioral Area: None, normal Jaw: None, normal Tongue: None, normal,Extremity Movements Upper (arms, wrists, hands, fingers): None, normal Lower (legs, knees, ankles, toes): None, normal, Trunk Movements Neck, shoulders, hips: None, normal, Overall Severity Severity of abnormal movements (highest score from questions above): None, normal Incapacitation due to abnormal movements: None, normal Patient's awareness of abnormal movements (rate only patient's report): No Awareness, Dental Status Current problems with teeth and/or dentures?: No Does patient usually wear dentures?: No  CIWA:  CIWA-Ar Total: 1 COWS:  COWS Total Score: 1  Treatment Plan Summary: Daily contact with patient to assess and evaluate symptoms and progress in treatment Medication management  Plan: 1. Continue crisis management and stabilization. 2. Medication management to reduce current symptoms to base line and improve patient's overall level of functioning 3. Treat health problems as indicated. 4. Develop treatment plan to decrease risk of relapse upon discharge and the need for     readmission. 5. Psycho-social education regarding relapse prevention and self care. 6. Health care follow up as needed for medical problems. 7. Continue home medications where appropriate. 8. ELOS: Anticipate discharge on Friday.  Medical Decision Making Problem Points:   Established problem, stable/improving (1) Data Points:  Review or order medicine tests (1)  I certify that inpatient services furnished can reasonably be expected to improve the patient's condition.  Rona Ravens. Patrich Heinze RPAC 6:10 PM 06/08/2012

## 2012-06-08 NOTE — Progress Notes (Signed)
Recreation Therapy Notes  Date: 03.27.2014 Time: 3:00pm Location: [address] Day Room      Group Topic/Focus: Leisure Education  Participation Level: Did not attend  [employer], LRT/CTRS  Jearl Klinefelter 06/08/2012 4:25 PM

## 2012-06-09 DIAGNOSIS — F329 Major depressive disorder, single episode, unspecified: Secondary | ICD-10-CM

## 2012-06-09 DIAGNOSIS — F411 Generalized anxiety disorder: Secondary | ICD-10-CM

## 2012-06-09 DIAGNOSIS — F1910 Other psychoactive substance abuse, uncomplicated: Secondary | ICD-10-CM

## 2012-06-09 DIAGNOSIS — F1994 Other psychoactive substance use, unspecified with psychoactive substance-induced mood disorder: Secondary | ICD-10-CM

## 2012-06-09 MED ORDER — TRAZODONE HCL 100 MG PO TABS
100.0000 mg | ORAL_TABLET | Freq: Every evening | ORAL | Status: DC | PRN
  Administered 2012-06-09 – 2012-06-14 (×11): 100 mg via ORAL
  Filled 2012-06-09: qty 1
  Filled 2012-06-09 (×2): qty 28
  Filled 2012-06-09 (×2): qty 1
  Filled 2012-06-09: qty 28
  Filled 2012-06-09 (×3): qty 1
  Filled 2012-06-09: qty 28
  Filled 2012-06-09 (×8): qty 1

## 2012-06-09 MED ORDER — NAPROXEN 500 MG PO TABS
250.0000 mg | ORAL_TABLET | Freq: Two times a day (BID) | ORAL | Status: DC
  Administered 2012-06-09 – 2012-06-15 (×12): 250 mg via ORAL
  Filled 2012-06-09 (×17): qty 1

## 2012-06-09 NOTE — Progress Notes (Signed)
Patient ID: John [name], male   DOB: 1970-06-17, 42 y.o.   MRN: [id number] Patient requested change in discharge plan as no longer interested in [facility]; wishes to explore [facility] possibilities. CSW to call and get date Monday.  Carney Bern, LCSWA

## 2012-06-09 NOTE — Progress Notes (Signed)
D: Patient denies HIand admits to thoughts of SI and seeing spots and hearing a conversation in his head; patient reports sleep is poor; reports appetite is poor ; reports energy level is low ; reports ability to pay attention is low; rates depression as 8/10; rates hopelessness 6/10; rates anxiety as 8/10;   A: Monitored q 15 minutes; patient encouraged to attend groups; patient educated about medications; patient given medications per physician orders; patient encouraged to express feelings and/or concerns  R: Patient stays in the bed most of the time and sleeps and he is present for afternoon group but he is sitting in the dayroom with a pillow and he has laid his head against it on the wall; patient is flat and depressed; patient's interaction with staff and peers is very minimal but will answer questions when being asked; patient was able to set goal to talk with staff 1:1 when having feelings of SI; patient is taking medications as prescribed and tolerating medications; patient is attending all groups

## 2012-06-09 NOTE — Progress Notes (Signed)
[facility] MD Progress Note  06/09/2012 3:50 PM John [name]  MRN:  [id number] Subjective:  7/10 depression, 8/10 anxiety, suicidal thoughts one time today not on assessment, sleep "fine with the sleeping pill", appetite "not good, think it is the alcohol", fine tremors on assessment, lower back pain--naproxen ordered, seeing black spots and hear voices that talk to each other but "I can hear what they are saying" Diagnosis:   Axis I: Anxiety Disorder NOS, Depressive Disorder NOS, Substance Abuse and Substance Induced Mood Disorder Axis II: Deferred Axis III:  Past Medical History  Diagnosis Date  . Asthma   . HIV (human immunodeficiency virus infection)   . Mental disorder   . Anxiety   . Depression   . Shortness of breath    Axis IV: economic problems, other psychosocial or environmental problems, problems related to social environment and problems with primary support group Axis V: 41-50 serious symptoms  ADL's:  Intact  Sleep: Good  Appetite:  Poor  Suicidal Ideation:  Denies Homicidal Ideation:  Denies Psychiatric Specialty Exam: Review of Systems  Constitutional: Negative.   HENT: Negative.   Eyes: Negative.   Respiratory: Negative.   Cardiovascular: Negative.   Gastrointestinal: Negative.   Genitourinary: Negative.   Musculoskeletal: Negative.   Skin: Negative.   Neurological: Negative.   Endo/Heme/Allergies: Negative.   Psychiatric/Behavioral: Positive for substance abuse.    Blood pressure 115/76, pulse 88, temperature 98.9 F (37.2 C), temperature source Oral, resp. rate 16, height 5\' 7"  (1.702 m), weight 79.833 kg (176 lb).Body mass index is 27.56 kg/(m^2).  General Appearance: Casual  Eye Contact::  Fair  Speech:  Normal Rate  Volume:  Normal  Mood:  Anxious and Depressed  Affect:  Flat  Thought Process:  Coherent  Orientation:  Full (Time, Place, and Person)  Thought Content:  Sees black spots and hears voices talking all the time  Suicidal Thoughts:   No  Homicidal Thoughts:  No  Memory:  Immediate;   Fair Recent;   Fair Remote;   Fair  Judgement:  Fair  Insight:  Fair  Psychomotor Activity:  Normal  Concentration:  Fair  Recall:  Fair  Akathisia:  No  Handed:  Right  AIMS (if indicated):     Assets:  Communication Skills Desire for Improvement Resilience  Sleep:  Number of Hours: 6.25   Current Medications: Current Facility-Administered Medications  Medication Dose Route Frequency Provider Last Rate Last Dose  . acetaminophen (TYLENOL) tablet 650 mg  650 mg Oral Q6H PRN Nanine Means, NP   650 mg at 06/07/12 1732  . albuterol (PROVENTIL HFA;VENTOLIN HFA) 108 (90 BASE) MCG/ACT inhaler 2 puff  2 puff Inhalation Q6H PRN Nanine Means, NP      . alum & mag hydroxide-simeth (MAALOX/MYLANTA) 200-200-20 MG/5ML suspension 30 mL  30 mL Oral Q4H PRN Nanine Means, NP      . chlordiazePOXIDE (LIBRIUM) capsule 25 mg  25 mg Oral Q6H PRN Nanine Means, NP      . chlordiazePOXIDE (LIBRIUM) capsule 25 mg  25 mg Oral BH-qamhs Nanine Means, NP   25 mg at 06/09/12 0829   Followed by  . [START ON 06/10/2012] chlordiazePOXIDE (LIBRIUM) capsule 25 mg  25 mg Oral Daily Nanine Means, NP      . citalopram (CELEXA) tablet 20 mg  20 mg Oral Daily Sanjuana Kava, NP   20 mg at 06/09/12 0829  . feeding supplement (ENSURE COMPLETE) liquid 237 mL  237 mL Oral BID BM Jeoffrey Massed,  RD   237 mL at 06/09/12 1437  . hydrOXYzine (ATARAX/VISTARIL) tablet 25 mg  25 mg Oral Q6H PRN Nanine Means, NP   25 mg at 06/06/12 2322  . loperamide (IMODIUM) capsule 2-4 mg  2-4 mg Oral PRN Nanine Means, NP      . magnesium hydroxide (MILK OF MAGNESIA) suspension 30 mL  30 mL Oral Daily PRN Nanine Means, NP      . mometasone-formoterol (DULERA) 100-5 MCG/ACT inhaler 2 puff  2 puff Inhalation BID Nanine Means, NP   2 puff at 06/09/12 0800  . multivitamin with minerals tablet 1 tablet  1 tablet Oral Daily Nanine Means, NP   1 tablet at 06/09/12 0829  . nicotine (NICODERM CQ - dosed in  mg/24 hours) patch 21 mg  21 mg Transdermal Q0600 Nanine Means, NP   21 mg at 06/09/12 6295  . ondansetron (ZOFRAN-ODT) disintegrating tablet 4 mg  4 mg Oral Q6H PRN Nanine Means, NP      . thiamine (VITAMIN B-1) tablet 100 mg  100 mg Oral Daily Nanine Means, NP   100 mg at 06/09/12 0829  . traZODone (DESYREL) tablet 50 mg  50 mg Oral QHS,MR X 1 Kerry Hough, PA-C   50 mg at 06/08/12 2302    Lab Results: No results found for this or any previous visit (from the past 48 hour(s)).  Physical Findings: AIMS: Facial and Oral Movements Muscles of Facial Expression: None, normal Lips and Perioral Area: None, normal Jaw: None, normal Tongue: None, normal,Extremity Movements Upper (arms, wrists, hands, fingers): None, normal Lower (legs, knees, ankles, toes): None, normal, Trunk Movements Neck, shoulders, hips: None, normal, Overall Severity Severity of abnormal movements (highest score from questions above): None, normal Incapacitation due to abnormal movements: None, normal Patient's awareness of abnormal movements (rate only patient's report): No Awareness, Dental Status Current problems with teeth and/or dentures?: No Does patient usually wear dentures?: No  CIWA:  CIWA-Ar Total: 0 COWS:  COWS Total Score: 1  Treatment Plan Summary: Daily contact with patient to assess and evaluate symptoms and progress in treatment Medication management  Plan:  Review of chart, vital signs, medications, and notes. 1-Individual and group therapy encouraged 2-Medication management for depression, alcohol detox, and anxiety:  Medications reviewed with the patient and he requested his trazodone be increased, increased from 50 mg to 100 mg to facilitate the intiation sooner 3-Coping skills for depression, anxiety, and substance abuse 4-Continue crisis stabilization and management 5-Address health issues--monitoring vital signs, stable 6-Treatment plan in progress to prevent relapse of depression,  substance abuse, and anxiety  Medical Decision Making Problem Points:  Established problem, stable/improving (1) and Review of psycho-social stressors (1) Data Points:  Review of medication regiment & side effects (2)  I certify that inpatient services furnished can reasonably be expected to improve the patient's condition.   Nanine Means, PMH-NP 06/09/2012, 3:50 PM

## 2012-06-09 NOTE — Progress Notes (Signed)
Patient did attend the evening karaoke group. Pt was engaged and supportive.   

## 2012-06-09 NOTE — Tx Team (Signed)
Interdisciplinary Treatment Plan Update (Adult)  Date: 06/09/2012  Time Reviewed: 9:53 AM   Progress in Treatment: Attending groups: No Participating in groups: No Taking medication as prescribed:  Yes Tolerating medication:  Yes Family/Significant othe contact made: No Patient understands diagnosis: Yes Discussing patient identified problems/goals with staff: Yes Medical problems stabilized or resolved:  Yes Denies suicidal/homicidal ideation: Yes Patient has not harmed self or Others: Yes  New problem(s) identified: None Identified  Discharge Plan or Barriers:  CSW is assessing for appropriate referrals with patient as patient reports only [facility] Discharge which limits him to [name] verses [facility] and [facility] options  Additional comments: N/A  Reason for Continuation of Hospitalization: Hallucinations Suicidal ideation    Estimated length of stay: 3-4 days  For review of initial/current patient goals, please see plan of care.  Attendees: Patient:     Family:     NP: Serena Colonel, NP 06/09/2012 9:56 AM   Nursing:   Carney Living, RN 06/09/2012 9:56 AM   Clinical Social Worker Ronda Fairly 06/09/2012 9:56 AM   Other:  Liliane Bade, Community Care Coordinator 06/09/2012 9:56 AM   Other:   06/09/2012 9:56 AM   Other:  Maryjean Morn, PA 06/09/2012 9:56 AM   Other:   06/09/2012 9:53 AM    Scribe for Treatment Team:   Carney Bern, LCSWA  06/09/2012 9:56 AM

## 2012-06-09 NOTE — Progress Notes (Signed)
Adult Psychosocial Assessment Update Interdisciplinary Team  [facility] admissions/discharges:  Admissions Discharges  Date: 08/12/11 Date: 08/16/11  Date: Date:  Date: Date:  Date: Date:  Date: Date:   Changes since the last Psychosocial Assessment (including adherence to outpatient mental health and/or substance abuse treatment, situational issues contributing to decompensation and/or relapse). Patient reports he is here for detox, has been consuming 1/2 gallon alcohol daily and  1/4 bag of THC daily.  Patient also reports audio hallucinations and states this is the  Reason he wants to sleep so much.Patient reports stressors include unemployment  Since 1991, HIV positive and asthma.  Patient reports he has no transportation        Discharge Plan 1. Will you be returning to the same living situation after discharge?   Yes: No:  X    If no, what is your plan?    "Basically homeless, have been living from place to place; sometimes am able to stay with aunt"       2. Would you like a referral for services when you are discharged? Yes:  X   If yes, for what services?  No:       Inpatient in [place] only       Summary and Recommendations (to be completed by the evaluator) Patient is 42 YO single unemployed caucasian male admitted with diagnosis of   Major Depressive Disorder, Recurrent, Severe With Psychotic Features, Alcohol   Dependence, and Cannabis Abuse. Patient will benefit from crisis stabilization,  Medication evaluation, group therapy and psycho education in addition to discharge   Planning.               Signature:  Clide Dales, 06/08/12

## 2012-06-09 NOTE — Progress Notes (Signed)
[facility] LCSW Group Therapy  06/09/2012 1:15  Type of Therapy: Group Therapy 1:15 to 2:30  Participation Level: Minimal  Participation Quality: Present in attendance only, slept through group  Siobahn Worsley, Julious Payer

## 2012-06-09 NOTE — Progress Notes (Signed)
Adult Psychoeducational Group Note  Date:  06/09/2012 Time:  11:46 AM  Group Topic/Focus:  Relapse Prevention Planning:   The focus of this group is to define relapse and discuss the need for planning to combat relapse.  Participation Level:  Did Not Attend  Participation Quality:  Additional Comments:  Pt. Didn't attend group.   Bing Plume D 06/09/2012, 11:46 AM

## 2012-06-09 NOTE — Progress Notes (Signed)
[facility] LCSW Aftercare Discharge Planning Group Note  06/09/2012 8:45 AM  Participation Quality:  Did not attend    Clide Dales

## 2012-06-10 DIAGNOSIS — F323 Major depressive disorder, single episode, severe with psychotic features: Principal | ICD-10-CM

## 2012-06-10 MED ORDER — IBUPROFEN 600 MG PO TABS
600.0000 mg | ORAL_TABLET | Freq: Four times a day (QID) | ORAL | Status: DC | PRN
  Administered 2012-06-10: 600 mg via ORAL
  Filled 2012-06-10: qty 1

## 2012-06-10 NOTE — Progress Notes (Signed)
I certify that inpatient services furnished can reasonably be expected to improve the patient's condition. Kurt Hoffmeier, MD, MSPH  

## 2012-06-10 NOTE — Progress Notes (Signed)
Adult Psychoeducational Group Note  Date:  06/10/2012 Time:  0900  Group Topic/Focus:  Self inventory tool  Participation Level:  Did Not Attend   Roselee Culver 06/10/2012, 12:28 PM

## 2012-06-10 NOTE — Clinical Social Work Note (Signed)
[facility] Group Notes: (Clinical Social Work)   06/10/2012      Type of Therapy:  Group Therapy   Participation Level:  Did Not Attend    Takeia Ciaravino Grossman-Orr, LCSW 06/10/2012, 12:09 PM     

## 2012-06-10 NOTE — Progress Notes (Signed)
I certify that inpatient services furnished can reasonably be expected to improve the patient's condition. Woodrow Drab, MD, MSPH  

## 2012-06-10 NOTE — Progress Notes (Signed)
John [name] did not complete his self inventory sheet and turn it in today, has good appetite, denies Si or HI today, hears voices telling him how stupid he is, did not attend group. Did not take his BP med this morning, going to DR for meals, evaluated by MD earlier this morning. A q49min safety checks continue and support offered patient, encouraged to attend group and participate R patient remains safe on the unit.

## 2012-06-10 NOTE — Progress Notes (Signed)
[facility] MD Progress Note  06/10/2012 1:42 PM John [name]  MRN:  [id number] Subjective:  John [name] reports that he is doing well except for a headache that he had for 2 days. He took some Tylenol for her yesterday and that helped some, but the headache has returned. He continues to endorse auditory hallucinations of voices of people talking to one another and saying how stupid he is. He also reports he has been seeing spots. He also endorses having some tremors, and cravings for alcohol. He reports that he sleeps well with his medication. He also endorses that his appetite is good. He denies any suicidal or homicidal ideation today.  Diagnosis:   Axis I: Alcohol dependence, Alcohol abuse with intoxication, Major depression with psychotic features Axis II: Deferred Axis III:  Past Medical History  Diagnosis Date  . Asthma   . HIV (human immunodeficiency virus infection)   . Mental disorder   . Anxiety   . Depression   . Shortness of breath     ADL's:  Intact  Sleep: Good  Appetite:  Good  Suicidal Ideation:  Patient denies any thought, plan, or intent Homicidal Ideation:  Patient denies any thought, plan, or intent AEB (as evidenced by):  Psychiatric Specialty Exam: Review of Systems  Constitutional: Negative.   Eyes: Negative.   Respiratory: Negative.   Cardiovascular: Negative.   Gastrointestinal: Negative.   Genitourinary: Negative.   Musculoskeletal: Negative.   Skin: Negative.   Neurological: Positive for tremors and headaches.  Endo/Heme/Allergies: Negative.   Psychiatric/Behavioral: Positive for depression and hallucinations. The patient is nervous/anxious.     Blood pressure 127/83, pulse 104, temperature 97.8 F (36.6 C), temperature source Oral, resp. rate 22, height 5\' 7"  (1.702 m), weight 79.833 kg (176 lb).Body mass index is 27.56 kg/(m^2).  General Appearance: Disheveled  Eye Contact::  Good  Speech:  Clear and Coherent  Volume:  Normal  Mood:  Anxious and  Dysphoric  Affect:  Congruent  Thought Process:  Linear  Orientation:  Full (Time, Place, and Person)  Thought Content:  Hallucinations: Auditory  Suicidal Thoughts:  No  Homicidal Thoughts:  No  Memory:  Immediate;   Good Recent;   Good Remote;   Good  Judgement:  Fair  Insight:  Fair  Psychomotor Activity:  Normal  Concentration:  Good  Recall:  Good  Akathisia:  No  Handed:  Right  AIMS (if indicated):     Assets:  Communication Skills Desire for Improvement  Sleep:  Number of Hours: 6   Current Medications: Current Facility-Administered Medications  Medication Dose Route Frequency Provider Last Rate Last Dose  . acetaminophen (TYLENOL) tablet 650 mg  650 mg Oral Q6H PRN Nanine Means, NP   650 mg at 06/09/12 2200  . albuterol (PROVENTIL HFA;VENTOLIN HFA) 108 (90 BASE) MCG/ACT inhaler 2 puff  2 puff Inhalation Q6H PRN Nanine Means, NP      . alum & mag hydroxide-simeth (MAALOX/MYLANTA) 200-200-20 MG/5ML suspension 30 mL  30 mL Oral Q4H PRN Nanine Means, NP      . citalopram (CELEXA) tablet 20 mg  20 mg Oral Daily Sanjuana Kava, NP   20 mg at 06/10/12 0834  . feeding supplement (ENSURE COMPLETE) liquid 237 mL  237 mL Oral BID BM Jeoffrey Massed, RD   237 mL at 06/10/12 1257  . magnesium hydroxide (MILK OF MAGNESIA) suspension 30 mL  30 mL Oral Daily PRN Nanine Means, NP      . mometasone-formoterol (DULERA) 100-5 MCG/ACT  inhaler 2 puff  2 puff Inhalation BID Nanine Means, NP   2 puff at 06/10/12 0834  . multivitamin with minerals tablet 1 tablet  1 tablet Oral Daily Nanine Means, NP   1 tablet at 06/10/12 0834  . naproxen (NAPROSYN) tablet 250 mg  250 mg Oral BID WC Nanine Means, NP   250 mg at 06/10/12 0835  . nicotine (NICODERM CQ - dosed in mg/24 hours) patch 21 mg  21 mg Transdermal Q0600 Nanine Means, NP   21 mg at 06/09/12 1914  . thiamine (VITAMIN B-1) tablet 100 mg  100 mg Oral Daily Nanine Means, NP   100 mg at 06/10/12 0835  . traZODone (DESYREL) tablet 100 mg  100 mg  Oral QHS,MR X 1 Nanine Means, NP   100 mg at 06/09/12 2159    Lab Results: No results found for this or any previous visit (from the past 48 hour(s)).  Physical Findings: AIMS: Facial and Oral Movements Muscles of Facial Expression: None, normal Lips and Perioral Area: None, normal Jaw: None, normal Tongue: None, normal,Extremity Movements Upper (arms, wrists, hands, fingers): None, normal Lower (legs, knees, ankles, toes): None, normal, Trunk Movements Neck, shoulders, hips: None, normal, Overall Severity Severity of abnormal movements (highest score from questions above): None, normal Incapacitation due to abnormal movements: None, normal Patient's awareness of abnormal movements (rate only patient's report): No Awareness, Dental Status Current problems with teeth and/or dentures?: No Does patient usually wear dentures?: No  CIWA:  CIWA-Ar Total: 0 COWS:  COWS Total Score: 1  Treatment Plan Summary: Daily contact with patient to assess and evaluate symptoms and progress in treatment Medication management Referral for continued treatment of substance abuse  Plan: We will discontinue Tylenol, and initiate ibuprofen as the patient has elevated liver enzymes. If the psychotic features do not subside, and antipsychotic medication will be warranted.  Medical Decision Making Problem Points:  Established problem, stable/improving (1) and Review of last therapy session (1) Data Points:  Review and summation of old records (2) Review of medication regiment & side effects (2) Review of new medications or change in dosage (2)  I certify that inpatient services furnished can reasonably be expected to improve the patient's condition.   John [name] 06/10/2012, 1:42 PM

## 2012-06-10 NOTE — Progress Notes (Signed)
Patient ID: John [name], male   DOB: Sep 11, 1970, 42 y.o.   MRN: [id number] D)  Resting, eyes closed, snoring resp, voices no c/o's.   A)  Will continue to monitor for safety. R)  Remains safe at this time.

## 2012-06-11 MED ORDER — RISPERIDONE 0.5 MG PO TABS
0.5000 mg | ORAL_TABLET | Freq: Every day | ORAL | Status: DC
  Administered 2012-06-11 – 2012-06-12 (×2): 0.5 mg via ORAL
  Filled 2012-06-11 (×5): qty 1

## 2012-06-11 MED ORDER — MAGNESIUM CITRATE PO SOLN
1.0000 | Freq: Once | ORAL | Status: AC
  Administered 2012-06-11: 1 via ORAL

## 2012-06-11 MED ORDER — BACLOFEN 10 MG PO TABS
10.0000 mg | ORAL_TABLET | Freq: Three times a day (TID) | ORAL | Status: DC
  Administered 2012-06-11 – 2012-06-15 (×12): 10 mg via ORAL
  Filled 2012-06-11: qty 9
  Filled 2012-06-11: qty 1
  Filled 2012-06-11: qty 9
  Filled 2012-06-11 (×5): qty 1
  Filled 2012-06-11 (×2): qty 9
  Filled 2012-06-11 (×8): qty 1
  Filled 2012-06-11: qty 9
  Filled 2012-06-11: qty 1

## 2012-06-11 MED ORDER — RISPERIDONE 1 MG PO TBDP
1.0000 mg | ORAL_TABLET | Freq: Once | ORAL | Status: AC
  Administered 2012-06-11: 1 mg via ORAL
  Filled 2012-06-11 (×2): qty 1

## 2012-06-11 NOTE — Progress Notes (Signed)
Adult Psychoeducational Group Note  Date:  06/11/2012 Time:  0900  Group Topic/Focus:  Self inventory sheet  Participation Level:  Active  Participation Quality:  Appropriate  Affect:  Appropriate  Cognitive:  Appropriate  Insight: Appropriate  Engagement in Group:  Engaged  Modes of Intervention:  Discussion  Additional Comments:  Participated.  Roselee Culver 06/11/2012, 12:23 PM

## 2012-06-11 NOTE — Progress Notes (Signed)
Writer observed patient sitting in the dayroom coloring. Writer spoke with patient 1:1 concerning how his day had been. Patient reports that his day has been good but did c/o lower back pain and received ibuprofen for pain he rated a 7. Patient denies si/hi but reports he hears voices and occassionally sees spots. Writer offered support and encouragement, informed patient of scheduled hs medications for 2200 and he is agreeable to plan of care. Safety maintained on unit with 15 min checks, will continue to monitor.

## 2012-06-11 NOTE — Progress Notes (Addendum)
D states he slept poorly last nite, appetiete is improving, energy level is normal and ability to pay attention is poor, depressed 7/10 and hopeless 6/10, WD s/s include tremors, cravings and chills along w/headaches, Si thoughts in last 24 hours but  Contracts for safety, attending groups and going to Dr for meals, interacting w/peers in the dayroom. C/o constipation today and given mag citrate, no results at this time. A q61min safaeety checks continue and support offered patient, encouraged to continue attending group r patient remains safe on the unit

## 2012-06-11 NOTE — Clinical Social Work Note (Signed)
[facility] Group Notes:  (Clinical Social Work)  06/11/2012  10:00-11:00AM  Summary of Progress/Problems:   The main focus of today's process group was to   identify the patient's current support system and decide on other supports that can be put in place to prevent future hospitalizations.  A handout was used to explain the 4 definitions/levels of support and to think about what support patient has given and received from others.  An emphasis was placed on using counselor, doctor, therapy groups, 12-step groups, and problem-specific support groups to expand supports. The patient expressed little, as he was late to group.  Type of Therapy:  Process Group with Motivational Interviewing  Participation Level:  Minimal  Participation Quality:  Attentive  Affect:  Blunted  Cognitive:  Oriented  Insight:  Developing/Improving  Engagement in Therapy:  Developing/Improving  Modes of Intervention:  Clarification, Education, Limit-setting, Problem-solving, Socialization, Support and Processing, Exploration, Discussion, Role-Play   Ambrose Mantle, LCSW 06/11/2012, 12:43 PM

## 2012-06-11 NOTE — Progress Notes (Signed)
[facility] Group Notes:  (Nursing/MHT/Case Management/Adjunct)  Date:  06/10/2012  Time:  2100  Type of Therapy:  wrap up group  Participation Level:  Active  Participation Quality:  Appropriate  Affect:  Appropriate  Cognitive:  Alert and Appropriate  Insight:  Good  Engagement in Group:  Engaged  Modes of Intervention:  Clarification, Education and Support  Summary of Progress/Problems:  John [name] 06/11/2012, 12:57 AM

## 2012-06-11 NOTE — Progress Notes (Signed)
[facility] MD Progress Note  06/11/2012 8:52 AM John [name]  MRN:  [id number] Subjective:  John [name] endorses that he continues to experience auditory hallucinations of voices talking to each other, and he feels that he is in between them. Upon further investigation, he reports that these voices are inside his head. He also continues to endorse seeing spots. He also reports having suicidal ideations this morning but denies any plans as there are no means. He denies any homicidal ideations. He continues to endorse a significant amount of depression, and rates his depression as a 7 on a scale of 1-10 where 10 is the worst. He also reports that his anxiety is high, and rates it as an 8 on a scale of 1-10. He reports that he slept well last night. His appetite is poor, but seems to be improving slightly. He denies any withdrawal symptoms, but reports that he is having cravings for alcohol.  Diagnosis:   Axis I: Alcohol dependence, Alcohol abuse with intoxication, Major depression with psychotic features Axis II: Deferred Axis III:  Past Medical History  Diagnosis Date  . Asthma   . HIV (human immunodeficiency virus infection)   . Mental disorder   . Anxiety   . Depression   . Shortness of breath     ADL's:  Intact  Sleep: Good  Appetite:  Poor  Suicidal Ideation:  Endorses thoughts, but denies any plan or intent. Homicidal Ideation:  Patient denies any thought, plan, or intent AEB (as evidenced by):  Psychiatric Specialty Exam: Review of Systems  Constitutional: Negative.   HENT: Negative.   Eyes: Negative.   Respiratory: Negative.   Cardiovascular: Negative.   Gastrointestinal: Negative.   Genitourinary: Negative.   Musculoskeletal: Negative.   Neurological: Negative.   Endo/Heme/Allergies: Negative.   Psychiatric/Behavioral: Positive for depression, suicidal ideas, hallucinations and substance abuse. The patient is nervous/anxious. The patient does not have insomnia.     Blood  pressure 144/79, pulse 93, temperature 97.8 F (36.6 C), temperature source Oral, resp. rate 24, height 5\' 7"  (1.702 m), weight 79.833 kg (176 lb).Body mass index is 27.56 kg/(m^2).  General Appearance: Disheveled  Eye Contact::  Good  Speech:  Clear and Coherent  Volume:  Normal  Mood:  Anxious and Dysphoric  Affect:  Congruent  Thought Process:  Linear  Orientation:  Full (Time, Place, and Person)  Thought Content:  WDL  Suicidal Thoughts:  Yes.  without intent/plan  Homicidal Thoughts:  No  Memory:  Immediate;   Good Recent;   Good Remote;   Good  Judgement:  Fair  Insight:  Fair  Psychomotor Activity:  Tremor  Concentration:  Good  Recall:  Good  Akathisia:  No  Handed:  Right  AIMS (if indicated):     Assets:  Communication Skills Desire for Improvement  Sleep:  Number of Hours: 6.5   Current Medications: Current Facility-Administered Medications  Medication Dose Route Frequency Provider Last Rate Last Dose  . albuterol (PROVENTIL HFA;VENTOLIN HFA) 108 (90 BASE) MCG/ACT inhaler 2 puff  2 puff Inhalation Q6H PRN Nanine Means, NP      . alum & mag hydroxide-simeth (MAALOX/MYLANTA) 200-200-20 MG/5ML suspension 30 mL  30 mL Oral Q4H PRN Nanine Means, NP      . baclofen (LIORESAL) tablet 10 mg  10 mg Oral TID Jorje Guild, PA-C      . citalopram (CELEXA) tablet 20 mg  20 mg Oral Daily Sanjuana Kava, NP   20 mg at 06/11/12 0749  . feeding  supplement (ENSURE COMPLETE) liquid 237 mL  237 mL Oral BID BM Jeoffrey Massed, RD   237 mL at 06/10/12 1257  . ibuprofen (ADVIL,MOTRIN) tablet 600 mg  600 mg Oral Q6H PRN Jorje Guild, PA-C   600 mg at 06/10/12 1953  . magnesium hydroxide (MILK OF MAGNESIA) suspension 30 mL  30 mL Oral Daily PRN Nanine Means, NP   30 mL at 06/10/12 1953  . mometasone-formoterol (DULERA) 100-5 MCG/ACT inhaler 2 puff  2 puff Inhalation BID Nanine Means, NP   2 puff at 06/11/12 0749  . multivitamin with minerals tablet 1 tablet  1 tablet Oral Daily Nanine Means, NP   1  tablet at 06/11/12 0749  . naproxen (NAPROSYN) tablet 250 mg  250 mg Oral BID WC Nanine Means, NP   250 mg at 06/11/12 0749  . nicotine (NICODERM CQ - dosed in mg/24 hours) patch 21 mg  21 mg Transdermal Q0600 Nanine Means, NP   21 mg at 06/11/12 0750  . thiamine (VITAMIN B-1) tablet 100 mg  100 mg Oral Daily Nanine Means, NP   100 mg at 06/11/12 0749  . traZODone (DESYREL) tablet 100 mg  100 mg Oral QHS,MR X 1 Nanine Means, NP   100 mg at 06/10/12 2253    Lab Results: No results found for this or any previous visit (from the past 48 hour(s)).  Physical Findings: AIMS: Facial and Oral Movements Muscles of Facial Expression: None, normal Lips and Perioral Area: None, normal Jaw: None, normal Tongue: None, normal,Extremity Movements Upper (arms, wrists, hands, fingers): None, normal Lower (legs, knees, ankles, toes): None, normal, Trunk Movements Neck, shoulders, hips: None, normal, Overall Severity Severity of abnormal movements (highest score from questions above): None, normal Incapacitation due to abnormal movements: None, normal Patient's awareness of abnormal movements (rate only patient's report): No Awareness, Dental Status Current problems with teeth and/or dentures?: No Does patient usually wear dentures?: No  CIWA:  CIWA-Ar Total: 1 COWS:  COWS Total Score: 1  Treatment Plan Summary: Daily contact with patient to assess and evaluate symptoms and progress in treatment Medication management  Plan: We will continue his current plan of care. We will add baclofen for cravings and anxiety relief.  Medical Decision Making Problem Points:  Established problem, stable/improving (1), Review of last therapy session (1) and Review of psycho-social stressors (1) Data Points:  Review and summation of old records (2) Review of medication regiment & side effects (2) Review of new medications or change in dosage (2)  I certify that inpatient services furnished can reasonably be  expected to improve the patient's condition.   John [name] 06/11/2012, 8:52 AM

## 2012-06-11 NOTE — Progress Notes (Signed)
Writer spoke with patient at medication window and inquired as to how his day has been and he reports that it was pretty rough. Patient informed Clinical research associate that he had thoughts and voices telling him to hurt himself. Patient talked about his daughter that was killed in a shooting and how he thinks of her often. Writer encouraged patient to speak with his social worker concerning grief counseling groups in his area. Patient informed of new medication starting at hs and he is agreeable to taking it. Patient reports passive si and verbally contracts, and hears voices off and on. Patient denies hi and visual hallucinations. Safety maintained with 15 min checks, will continue to monitor.

## 2012-06-11 NOTE — Progress Notes (Signed)
Patient ID: John [name], male   DOB: Apr 21, 1970, 42 y.o.   MRN: [id number]  Subjective: Patient approach provider asking to talk. Reports he is experiencing an increase in auditory hallucinations as well as thoughts of self-harm. He states this began when he thought about his daughter who was killed. He reports that he is having thoughts that if he kills himself he made be reunited with her. The voices are telling him to harm himself.  Objective: well-nourished well-developed black male in acute distress with an extremely anxious mood and affect, wringing his hands and grossly tremulous.  Assessment and Plan: Will initiate Risperdal 1 mg M-tab now, then Risperdal 0.5 mg at bedtime.

## 2012-06-12 DIAGNOSIS — F1010 Alcohol abuse, uncomplicated: Secondary | ICD-10-CM

## 2012-06-12 NOTE — Progress Notes (Signed)
Patient ID: John [name], male   DOB: [birth year], 42 y.o.   MRN: [id number] D: pt. Lying in bed, pt. Reports had asthma attack today. Pt. Reports depression at "8" of 10. Pt. Reports being here has helped "the laughter, having friends around, but now they all gone"  Pt. Reports plans to return home, but plans to "find an AA meeting, going to stick to that". A: Writer encouraged pt. To continue with plans for detox. Staff will monitor for safety. R: Pt. Is safe on the unit. Pt. Attended group.

## 2012-06-12 NOTE — Progress Notes (Signed)
Adult Psychoeducational Group Note  Date:  06/12/2012 Time:  11:58 AM  Group Topic/Focus:  Self Care:   The focus of this group is to help patients understand the importance of self-care in order to improve or restore emotional, physical, spiritual, interpersonal, and financial health.  Participation Level:  Active  Participation Quality:  Appropriate and Attentive  Affect:  Appropriate  Cognitive:  Alert and Appropriate  Insight: Appropriate  Engagement in Group:  Engaged  Modes of Intervention:  Discussion  Additional Comments:  Pt. Was attentive and appropriate for today's group discussion on Wellness. Pt. Was able to share about self- care. Pt was able to complete Self-Care Assessment. Pt. Shared the following items he does well make weekly date night for family, massages, and spend time with pets. However pt. Stated that he don't get enough sleep and poison his body with alcohol.   John [name] D 06/12/2012, 11:58 AM

## 2012-06-12 NOTE — Progress Notes (Signed)
Patient ID: John [name], male   DOB: [birth year], 42 y.o.   MRN: [id number] [facility] MD Progress Note  06/12/2012 4:13 PM John [name]  MRN:  [id number]  Subjective:  John [name] endorses that he continues to experience auditory/visual hallucinations. He described the voices as "Bunch of voices conversing with each other and seeing spots. He state that he is still suicidal, however denies any intent and or thoughts to hurts himself. Refused to go to the gym with the other patients".  O: This assessment was conducted in patient's room. John [name] was observed in his room fast asleep and snoring loudly. He was woken up for this assessment. His speech is coherent, alert and oriented x 3. He presents with appropriate affect, however continue to endorse depression.  Diagnosis:   Axis I: Alcohol dependence, Alcohol abuse with intoxication, Major depression with psychotic features Axis II: Deferred Axis III:  Past Medical History  Diagnosis Date  . Asthma   . HIV (human immunodeficiency virus infection)   . Mental disorder   . Anxiety   . Depression   . Shortness of breath     ADL's:  Intact  Sleep: Good  Appetite:  Poor  Suicidal Ideation:  Endorses thoughts, but denies any plan or intent.  Homicidal Ideation:  Patient denies any thought, plan, or intent  AEB (as evidenced by):  Psychiatric Specialty Exam: Review of Systems  Constitutional: Negative.   HENT: Negative.   Eyes: Negative.   Respiratory: Negative.   Cardiovascular: Negative.   Gastrointestinal: Negative.   Genitourinary: Negative.   Musculoskeletal: Negative.   Neurological: Negative.   Endo/Heme/Allergies: Negative.   Psychiatric/Behavioral: Positive for depression, suicidal ideas, hallucinations and substance abuse. The patient is nervous/anxious. The patient does not have insomnia.     Blood pressure 153/93, pulse 88, temperature 98.9 F (37.2 C), temperature source Oral, resp. rate 16, height 5\' 7"  (1.702 m),  weight 79.833 kg (176 lb), SpO2 96.00%.Body mass index is 27.56 kg/(m^2).  General Appearance: Disheveled  Eye Contact::  Good  Speech:  Clear and Coherent  Volume:  Normal  Mood:  Anxious and Dysphoric, "still depressed"  Affect:  Congruent  Thought Process:  Linear  Orientation:  Full (Time, Place, and Person)  Thought Content: auditory/visual hallucinations.  Suicidal Thoughts:  Yes.  without intent/plan  Homicidal Thoughts:  No  Memory:  Immediate;   Good Recent;   Good Remote;   Good  Judgement:  Fair  Insight:  Fair  Psychomotor Activity:  Tremor  Concentration:  Good  Recall:  Good  Akathisia:  No  Handed:  Right  AIMS (if indicated):     Assets:  Communication Skills Desire for Improvement  Sleep:  Number of Hours: 6   Current Medications: Current Facility-Administered Medications  Medication Dose Route Frequency Provider Last Rate Last Dose  . albuterol (PROVENTIL HFA;VENTOLIN HFA) 108 (90 BASE) MCG/ACT inhaler 2 puff  2 puff Inhalation Q6H PRN Nanine Means, NP      . alum & mag hydroxide-simeth (MAALOX/MYLANTA) 200-200-20 MG/5ML suspension 30 mL  30 mL Oral Q4H PRN Nanine Means, NP      . baclofen (LIORESAL) tablet 10 mg  10 mg Oral TID Jorje Guild, PA-C   10 mg at 06/12/12 1151  . citalopram (CELEXA) tablet 20 mg  20 mg Oral Daily Sanjuana Kava, NP   20 mg at 06/12/12 0807  . feeding supplement (ENSURE COMPLETE) liquid 237 mL  237 mL Oral BID BM Jeoffrey Massed, RD   [phone]  mL at 06/11/12 0944  . ibuprofen (ADVIL,MOTRIN) tablet 600 mg  600 mg Oral Q6H PRN Jorje Guild, PA-C   600 mg at 06/10/12 1953  . magnesium hydroxide (MILK OF MAGNESIA) suspension 30 mL  30 mL Oral Daily PRN Nanine Means, NP   30 mL at 06/10/12 1953  . mometasone-formoterol (DULERA) 100-5 MCG/ACT inhaler 2 puff  2 puff Inhalation BID Nanine Means, NP   2 puff at 06/12/12 0807  . multivitamin with minerals tablet 1 tablet  1 tablet Oral Daily Nanine Means, NP   1 tablet at 06/12/12 1610  . naproxen  (NAPROSYN) tablet 250 mg  250 mg Oral BID WC Nanine Means, NP   250 mg at 06/12/12 0807  . nicotine (NICODERM CQ - dosed in mg/24 hours) patch 21 mg  21 mg Transdermal Q0600 Nanine Means, NP   21 mg at 06/12/12 0810  . risperiDONE (RISPERDAL) tablet 0.5 mg  0.5 mg Oral QHS Jorje Guild, PA-C   0.5 mg at 06/11/12 2217  . thiamine (VITAMIN B-1) tablet 100 mg  100 mg Oral Daily Nanine Means, NP   100 mg at 06/12/12 0808  . traZODone (DESYREL) tablet 100 mg  100 mg Oral QHS,MR X 1 Nanine Means, NP   100 mg at 06/11/12 2313    Lab Results: No results found for this or any previous visit (from the past 48 hour(s)).  Physical Findings: AIMS: Facial and Oral Movements Muscles of Facial Expression: None, normal Lips and Perioral Area: None, normal Jaw: None, normal Tongue: None, normal,Extremity Movements Upper (arms, wrists, hands, fingers): None, normal Lower (legs, knees, ankles, toes): None, normal, Trunk Movements Neck, shoulders, hips: None, normal, Overall Severity Severity of abnormal movements (highest score from questions above): None, normal Incapacitation due to abnormal movements: None, normal Patient's awareness of abnormal movements (rate only patient's report): No Awareness, Dental Status Current problems with teeth and/or dentures?: No Does patient usually wear dentures?: No  CIWA:  CIWA-Ar Total: 0 COWS:  COWS Total Score: 1  Treatment Plan Summary: Daily contact with patient to assess and evaluate symptoms and progress in treatment Medication management  Plan: Supportive approach/coping skills/relapse prevention. Encouraged out of room, participation in group sessions and application of coping skills when distressed. Will continue to monitor response to/adverse effects of medications in use to assure effectiveness. Continue to monitor mood, behavior and interaction with staff and other patients. Continue current plan of care.  Medical Decision Making Problem Points:   Established problem, stable/improving (1), Review of last therapy session (1) and Review of psycho-social stressors (1) Data Points:  Review and summation of old records (2) Review of medication regiment & side effects (2) Review of new medications or change in dosage (2)  I certify that inpatient services furnished can reasonably be expected to improve the patient's condition.   Armandina Stammer I, PMHNp-BC 06/12/2012, 4:13 PM

## 2012-06-12 NOTE — Progress Notes (Signed)
[facility] LCSW Group Therapy  06/12/2012 1:15 PM  Type of Therapy:  Group Therapy from 1:15 to 2:30 PM  Participation Level: None, asleep  Participation Quality:  Distracting, CSW woke patient once as he was snoring and second waking as patient to return to his room.    Clide Dales

## 2012-06-12 NOTE — Progress Notes (Signed)
[facility] LCSW Aftercare Discharge Planning Group Note  06/12/2012 8:45 AM  Participation Quality: Minimal  Affect:  Flat  Cognitive:  Oriented  Insight: Limited  Engagement in Group: Limited  Modes of Intervention: Exploration, Clarification and support  Summary of Progress/Problems: Pt endorses suicidal and agrees to Contract for safety.  On a scale of 1 to 10 with ten being the most ever experienced, the patient rates depression at a 9 and anxiety at a 8.  Patient reports he spoke with aunt over the weekend but did not confirm whether he could return there or not.    John [name]

## 2012-06-12 NOTE — Progress Notes (Signed)
Adult Psychoeducational Group Note  Date:  06/10/2012 Time:  2:15 PM  Group Topic/Focus:  Goals Group:   The focus of this group is to help patients establish daily goals to achieve during treatment and discuss how the patient can incorporate goal setting into their daily lives to aide in recovery.  Participation Level:  Did Not Attend  Participation Quality:  Resistant  Affect:  Did not attend  Cognitive:  Did not attend  Insight: Lacking  Engagement in Group:  Did not attend  Modes of Intervention:  Clarification, Discussion, Education, Problem-solving, Socialization and Support  Additional Comments:  Did not attend.  Pixie Casino [place] 06/10/2012, 3:30 PM

## 2012-06-13 DIAGNOSIS — F259 Schizoaffective disorder, unspecified: Secondary | ICD-10-CM

## 2012-06-13 DIAGNOSIS — F323 Major depressive disorder, single episode, severe with psychotic features: Secondary | ICD-10-CM | POA: Diagnosis present

## 2012-06-13 MED ORDER — RISPERIDONE 0.5 MG PO TABS
0.5000 mg | ORAL_TABLET | Freq: Two times a day (BID) | ORAL | Status: DC
  Administered 2012-06-14: 0.5 mg via ORAL
  Filled 2012-06-13 (×3): qty 1

## 2012-06-13 MED ORDER — RISPERIDONE 1 MG PO TABS
1.0000 mg | ORAL_TABLET | Freq: Every day | ORAL | Status: DC
  Administered 2012-06-13: 1 mg via ORAL
  Filled 2012-06-13 (×3): qty 1

## 2012-06-13 NOTE — Progress Notes (Signed)
I certify that inpatient services furnished can reasonably be expected to improve the patient's condition. Dellamae Rosamilia, MD, MSPH  

## 2012-06-13 NOTE — [facility] Suicide Risk Assessment (Signed)
[facility] INPATIENT:  Family/Significant Other Suicide Prevention Education  Suicide Prevention Education:  Contact Attemps:  Ms. Leighton Roach, [phone]; ) has been identified by the patient as the family member/significant other with whom the patient will be residing, and identified as the person(s) who will aid the patient in the event of a mental health crisis.  With written consent from the patient, two attempts were made to provide suicide prevention education, prior to and/or following the patient's discharge.  We were unsuccessful in providing suicide prevention education.  A suicide education pamphlet was given to the patient to share with family/significant other.  Date and time of first attempt: 06/13/12 @ 11:45 AM Date and time of second attempt:  06/13/12 @ 12:23 PM  John [name], John [name] July 06/13/2012, 11:47 AM

## 2012-06-13 NOTE — Progress Notes (Signed)
Patient ID: John [name], male   DOB: [birth year], 42 y.o.   MRN: [id number] (D)Patient continues to detox from alcohol; his symptoms have diminished today and has not needed any prns.  His only complaint has been lower back pain which he received naproxen with relief.  He remains with increased anxiety which he rates as a 10.  He remains depressed and rates it as a 7; hopelessness at a 2.  He remains with flat, blunted affect.  He has passive SI with no specific plan and contracts for safety on the unit. Patient has been pleasant and is interacting well with staff.  He is staying in his room today due to illness on the unit, coming out only to get his medications.  Patient has not exhibited any symptoms of illness.    (A)Continue to monitor medication management and MD orders.  Safety checks continued every 15 minutes per protocol.  Monitor patient for any sign/symptoms of illness.  (R)Patient's behavior has been appropriate.

## 2012-06-13 NOTE — Progress Notes (Signed)
Patient ID: John [name], male   DOB: 11-21-1970, 41 y.o.   MRN: [id number]  D: Pt denies SI/HI/VH. Pt is pleasant and cooperative. Pt +ve for AH but say they are not command. Pt states he had a good day.   A: Pt was offered support and encouragement. Pt was given scheduled medications.Q 15 minute checks were done for safety.   R: Pt is taking medication. Pt has no complaints at this time.Pt receptive to treatment and safety maintained on unit.

## 2012-06-13 NOTE — Progress Notes (Signed)
[facility] MD Progress Note  06/13/2012 2:32 PM John [name]  MRN:  [id number] Subjective:  John [name] endorses that he is still hearing voices. The voices tell him to hurt himself. The Risperdal has not help so far. Has taken Haldol in the past. He is committed not to drink. He was made aware of the status of his liver with the increase in the liver enzymes and the need to abstain.  Diagnosis:  Alcohol Dependence, Schizoaffective Disorder  ADL's:  Intact  Sleep: Poor  Appetite:  Fair  Suicidal Ideation:  Plan:  ideas, no plans Intent:  denies Means:  denies Homicidal Ideation:  Plan:  denies Intent:  denies Means:  denies AEB (as evidenced by):  Psychiatric Specialty Exam: Review of Systems  Constitutional: Negative.   HENT: Negative.   Eyes: Negative.   Respiratory: Negative.   Cardiovascular: Negative.   Gastrointestinal: Negative.   Genitourinary: Negative.   Musculoskeletal: Negative.   Skin: Negative.   Neurological: Negative.   Endo/Heme/Allergies: Negative.   Psychiatric/Behavioral: Positive for depression, suicidal ideas, hallucinations and substance abuse. The patient is nervous/anxious and has insomnia.     Blood pressure 133/85, pulse 107, temperature 98.9 F (37.2 C), temperature source Oral, resp. rate 16, height 5\' 7"  (1.702 m), weight 79.833 kg (176 lb), SpO2 96.00%.Body mass index is 27.56 kg/(m^2).  General Appearance: Disheveled  Eye Solicitor::  Fair  Speech:  Clear and Coherent and rapid  Volume:  Normal  Mood:  Anxious and Irritable  Affect:  Restricted  Thought Process:  Coherent and Goal Directed  Orientation:  Full (Time, Place, and Person)  Thought Content:  worries, concerns  Suicidal Thoughts:  Yes.  without intent/plan  Homicidal Thoughts:  No  Memory:  Immediate;   Fair Recent;   Fair Remote;   Fair  Judgement:  Fair  Insight:  superficial  Psychomotor Activity:  Restlessness  Concentration:  Fair  Recall:  Fair  Akathisia:  No  Handed:   Right  AIMS (if indicated):     Assets:  Desire for Improvement  Sleep:  Number of Hours: 6   Current Medications: Current Facility-Administered Medications  Medication Dose Route Frequency Provider Last Rate Last Dose  . albuterol (PROVENTIL HFA;VENTOLIN HFA) 108 (90 BASE) MCG/ACT inhaler 2 puff  2 puff Inhalation Q6H PRN Nanine Means, NP      . alum & mag hydroxide-simeth (MAALOX/MYLANTA) 200-200-20 MG/5ML suspension 30 mL  30 mL Oral Q4H PRN Nanine Means, NP      . baclofen (LIORESAL) tablet 10 mg  10 mg Oral TID Jorje Guild, PA-C   10 mg at 06/13/12 1205  . citalopram (CELEXA) tablet 20 mg  20 mg Oral Daily Sanjuana Kava, NP   20 mg at 06/13/12 0810  . feeding supplement (ENSURE COMPLETE) liquid 237 mL  237 mL Oral BID BM Jeoffrey Massed, RD   237 mL at 06/13/12 1405  . ibuprofen (ADVIL,MOTRIN) tablet 600 mg  600 mg Oral Q6H PRN Jorje Guild, PA-C   600 mg at 06/10/12 1953  . magnesium hydroxide (MILK OF MAGNESIA) suspension 30 mL  30 mL Oral Daily PRN Nanine Means, NP   30 mL at 06/10/12 1953  . mometasone-formoterol (DULERA) 100-5 MCG/ACT inhaler 2 puff  2 puff Inhalation BID Nanine Means, NP   2 puff at 06/13/12 0809  . multivitamin with minerals tablet 1 tablet  1 tablet Oral Daily Nanine Means, NP   1 tablet at 06/13/12 0810  . naproxen (NAPROSYN) tablet 250 mg  250 mg Oral BID WC Nanine Means, NP   250 mg at 06/13/12 0810  . nicotine (NICODERM CQ - dosed in mg/24 hours) patch 21 mg  21 mg Transdermal Q0600 Nanine Means, NP   21 mg at 06/13/12 0650  . risperiDONE (RISPERDAL) tablet 0.5 mg  0.5 mg Oral QHS Jorje Guild, PA-C   0.5 mg at 06/12/12 2207  . thiamine (VITAMIN B-1) tablet 100 mg  100 mg Oral Daily Nanine Means, NP   100 mg at 06/13/12 0810  . traZODone (DESYREL) tablet 100 mg  100 mg Oral QHS,MR X 1 Nanine Means, NP   100 mg at 06/12/12 2300    Lab Results: No results found for this or any previous visit (from the past 48 hour(s)).  Physical Findings: AIMS: Facial and Oral  Movements Muscles of Facial Expression: None, normal Lips and Perioral Area: None, normal Jaw: None, normal Tongue: None, normal,Extremity Movements Upper (arms, wrists, hands, fingers): None, normal Lower (legs, knees, ankles, toes): None, normal, Trunk Movements Neck, shoulders, hips: None, normal, Overall Severity Severity of abnormal movements (highest score from questions above): None, normal Incapacitation due to abnormal movements: None, normal Patient's awareness of abnormal movements (rate only patient's report): No Awareness, Dental Status Current problems with teeth and/or dentures?: No Does patient usually wear dentures?: No  CIWA:  CIWA-Ar Total: 1 COWS:  COWS Total Score: 1  Treatment Plan Summary: Daily contact with patient to assess and evaluate symptoms and progress in treatment Medication management  Plan: Supportive approach/coping skills/relapse prevention           Work to improve reality testing           Increase the Risperdal             Medical Decision Making Problem Points:  Review of psycho-social stressors (1) Data Points:  Review of medication regiment & side effects (2) Review of new medications or change in dosage (2)  I certify that inpatient services furnished can reasonably be expected to improve the patient's condition.   John [name] 06/13/2012, 2:32 PM

## 2012-06-14 DIAGNOSIS — F4310 Post-traumatic stress disorder, unspecified: Secondary | ICD-10-CM

## 2012-06-14 MED ORDER — RISPERIDONE 1 MG PO TABS
1.5000 mg | ORAL_TABLET | Freq: Every day | ORAL | Status: DC
  Administered 2012-06-14: 1.5 mg via ORAL
  Filled 2012-06-14 (×4): qty 1

## 2012-06-14 NOTE — Progress Notes (Signed)
[facility] MD Progress Note  06/14/2012 10:54 AM John [name]  MRN:  [id number] Subjective:  John [name] endorses that the voices seem to be getting better but the medication is making him very sedated. He slept through the night. He is in bed this morning. He is going to push through and get up and try to stay up. He is concerned about the voices as they have him thinking about killing himself when he hears them. He admits to persistent thoughts about his daughter who was killed by a drive through shooting. (seems that her mother's boyfriend was involved in drug-criminal activity) Diagnosis:  Major Depression with psychotic features, PTSD  ADL's:  Intact  Sleep: excesive  Appetite:  Fair  Suicidal Ideation:  Plan:  denies Intent:  denies Means:  denies Homicidal Ideation:  Plan:  denies Intent:  denies Means:  denies AEB (as evidenced by):  Psychiatric Specialty Exam: Review of Systems  Constitutional: Negative.   HENT: Negative.   Eyes: Negative.   Respiratory: Negative.   Cardiovascular: Negative.   Gastrointestinal: Negative.   Genitourinary: Negative.   Musculoskeletal: Negative.   Skin: Negative.   Neurological: Negative.   Endo/Heme/Allergies: Negative.   Psychiatric/Behavioral: Positive for hallucinations and substance abuse. The patient is nervous/anxious.     Blood pressure 121/75, pulse 109, temperature 98.1 F (36.7 C), temperature source Oral, resp. rate 18, height 5\' 7"  (1.702 m), weight 79.833 kg (176 lb), SpO2 96.00%.Body mass index is 27.56 kg/(m^2).  General Appearance: Disheveled  Eye Contact::  Minimal  Speech:  Clear and Coherent, Slow and not spontaneous  Volume:  Decreased  Mood:  Depressed and worried  Affect:  Restricted  Thought Process:  Coherent and Goal Directed  Orientation:  Full (Time, Place, and Person)  Thought Content:  worries, concerns  Suicidal Thoughts:  Yes.  without intent/plan, when he hears the voices  Homicidal Thoughts:  No  Memory:   Immediate;   Fair Recent;   Fair Remote;   Fair  Judgement:  Fair  Insight:  Present  Psychomotor Activity:  Decreased  Concentration:  Fair  Recall:  Fair  Akathisia:  No  Handed:  Right  AIMS (if indicated):     Assets:  Desire for Improvement  Sleep:  Number of Hours: 6   Current Medications: Current Facility-Administered Medications  Medication Dose Route Frequency Provider Last Rate Last Dose  . albuterol (PROVENTIL HFA;VENTOLIN HFA) 108 (90 BASE) MCG/ACT inhaler 2 puff  2 puff Inhalation Q6H PRN Nanine Means, NP      . alum & mag hydroxide-simeth (MAALOX/MYLANTA) 200-200-20 MG/5ML suspension 30 mL  30 mL Oral Q4H PRN Nanine Means, NP      . baclofen (LIORESAL) tablet 10 mg  10 mg Oral TID Jorje Guild, PA-C   10 mg at 06/14/12 0759  . citalopram (CELEXA) tablet 20 mg  20 mg Oral Daily Sanjuana Kava, NP   20 mg at 06/14/12 0759  . feeding supplement (ENSURE COMPLETE) liquid 237 mL  237 mL Oral BID BM Jeoffrey Massed, RD   237 mL at 06/13/12 1405  . ibuprofen (ADVIL,MOTRIN) tablet 600 mg  600 mg Oral Q6H PRN Jorje Guild, PA-C   600 mg at 06/10/12 1953  . magnesium hydroxide (MILK OF MAGNESIA) suspension 30 mL  30 mL Oral Daily PRN Nanine Means, NP   30 mL at 06/10/12 1953  . mometasone-formoterol (DULERA) 100-5 MCG/ACT inhaler 2 puff  2 puff Inhalation BID Nanine Means, NP   2 puff at 06/14/12  92  . multivitamin with minerals tablet 1 tablet  1 tablet Oral Daily Nanine Means, NP   1 tablet at 06/14/12 0759  . naproxen (NAPROSYN) tablet 250 mg  250 mg Oral BID WC Nanine Means, NP   250 mg at 06/14/12 0759  . nicotine (NICODERM CQ - dosed in mg/24 hours) patch 21 mg  21 mg Transdermal Q0600 Nanine Means, NP   21 mg at 06/14/12 1610  . risperiDONE (RISPERDAL) tablet 0.5 mg  0.5 mg Oral BID Rachael Fee, MD   0.5 mg at 06/14/12 0759  . risperiDONE (RISPERDAL) tablet 1 mg  1 mg Oral QHS Rachael Fee, MD   1 mg at 06/13/12 2204  . thiamine (VITAMIN B-1) tablet 100 mg  100 mg Oral Daily  Nanine Means, NP   100 mg at 06/14/12 0759  . traZODone (DESYREL) tablet 100 mg  100 mg Oral QHS,MR X 1 Nanine Means, NP   100 mg at 06/13/12 2311    Lab Results: No results found for this or any previous visit (from the past 48 hour(s)).  Physical Findings: AIMS: Facial and Oral Movements Muscles of Facial Expression: None, normal Lips and Perioral Area: None, normal Jaw: None, normal Tongue: None, normal,Extremity Movements Upper (arms, wrists, hands, fingers): None, normal Lower (legs, knees, ankles, toes): None, normal, Trunk Movements Neck, shoulders, hips: None, normal, Overall Severity Severity of abnormal movements (highest score from questions above): None, normal Incapacitation due to abnormal movements: None, normal Patient's awareness of abnormal movements (rate only patient's report): No Awareness, Dental Status Current problems with teeth and/or dentures?: No Does patient usually wear dentures?: No  CIWA:  CIWA-Ar Total: 1 COWS:  COWS Total Score: 1  Treatment Plan Summary: Daily contact with patient to assess and evaluate symptoms and progress in treatment Medication management  Plan: Supportive approach/coping skills/relapse prevention           Grief-loss            Change the Risperdal to bedtime  Medical Decision Making Problem Points:  Review of psycho-social stressors (1) Data Points:  Review of medication regiment & side effects (2)  I certify that inpatient services furnished can reasonably be expected to improve the patient's condition.   John [name] A 06/14/2012, 10:54 AM

## 2012-06-14 NOTE — Tx Team (Signed)
Interdisciplinary Treatment Plan Update (Adult)  Date: 06/14/2012  Time Reviewed: 10:30 AM   Progress in Treatment: Attending groups: Yes  Participating in groups: Improving Taking medication as prescribed:  Yes Tolerating medication:  Yes Family/Significant othe contact made: Yes Patient understands diagnosis: Yes Discussing patient identified problems/goals with staff: Yes Medical problems stabilized or resolved:  Yes Denies suicidal/homicidal ideation: Yes Patient has not harmed self or Others: Yes  New problem(s) identified: None Identified  Discharge Plan or Barriers:  Patient will follow up at [facility] for medication management and therapy.   Additional comments: N/A  Reason for Continuation of Hospitalization: Hallucinations Medication stabilization   Estimated length of stay: 1-3days  For review of initial/current patient goals, please see plan of care.  Attendees: Patient:     Family:     Physician:  Geoffery Lyons 06/14/2012 10:30 AM   Nursing:   Roswell Miners, RN 06/14/2012 10:30 AM   Clinical Social Worker Glendel Jaggers 06/14/2012 10:30 AM   Other:   06/14/2012    Other:   06/14/2012    Other:   06/14/2012    Other:   06/14/2012     Scribe for Treatment Team:   Carney Bern, LCSWA  06/14/2012 10:30 AM

## 2012-06-14 NOTE — Progress Notes (Signed)
Patient ID: John [name], male   DOB: 1970/05/13, 42 y.o.   MRN: [id number] He has been in bed most of AM stated that his HS medication mad him sleepy today.  At present time he is up and says that he is feeling better.    He says that he hears voices sometimes but they are not as bad.

## 2012-06-14 NOTE — Progress Notes (Signed)
Patient ID: John [name], male   DOB: April 21, 1970, 42 y.o.   MRN: [id number] D: pt. Sitting in door way watching TV, reports day been "wonderful". Pt. Hopes he will be leaving Saturday will be going to live with aunt. Pt. Reports plan to follow up with AA for support, also plans to be in church. A: Writer encouraged pt. To continue plans for recovery. Staff will monitor q5min for safety. R: Pt. Is safe on the unit.

## 2012-06-14 NOTE — [facility] Suicide Risk Assessment (Signed)
[facility] INPATIENT:  Family/Significant Other Suicide Prevention Education  Suicide Prevention Education:  Contact Attempts: Ms.  has been identified by the patient as the family member/significant other with whom the patient will be residing, and identified as the person(s) who will aid the patient in the event of a mental health crisis.  With written consent from the patient, two attempts were made to provide suicide prevention education, prior to and/or following the patient's discharge.  We were unsuccessful in providing suicide prevention education.  A suicide education pamphlet was given to the patient to share with family/significant other.  Date and time of first two attempt: 06/13/12 at 11:45 AM and 12:23 by Q.Hodnett Date and time of third attempt: 06/14/2012 4:15 PM  Writer provided suicide prevention education directly to patient; conversation included risk factors, warning signs and resources to contact for help. Mobile crisis services explained and contact card placed in chart for pt to receive at discharge.  Clide Dales 06/14/2012, 5:23 PM

## 2012-06-15 MED ORDER — BACLOFEN 10 MG PO TABS: 10.0000 mg | ORAL_TABLET | Freq: Three times a day (TID) | ORAL | Status: DC

## 2012-06-15 MED ORDER — TRAZODONE HCL 100 MG PO TABS: 100.0000 mg | ORAL_TABLET | Freq: Every evening | ORAL | Status: DC | PRN

## 2012-06-15 MED ORDER — CITALOPRAM HYDROBROMIDE 20 MG PO TABS: 20.0000 mg | ORAL_TABLET | Freq: Every day | ORAL | Status: DC

## 2012-06-15 MED ORDER — ALBUTEROL SULFATE HFA 108 (90 BASE) MCG/ACT IN AERS: 2.0000 | INHALATION_SPRAY | Freq: Four times a day (QID) | RESPIRATORY_TRACT | Status: DC | PRN

## 2012-06-15 MED ORDER — RISPERIDONE 0.5 MG PO TABS: 1.5000 mg | ORAL_TABLET | Freq: Every day | ORAL | Status: DC

## 2012-06-15 MED ORDER — FLUTICASONE-SALMETEROL 100-50 MCG/DOSE IN AEPB: 1.0000 | INHALATION_SPRAY | Freq: Every morning | RESPIRATORY_TRACT | Status: DC

## 2012-06-15 NOTE — Progress Notes (Deleted)
[facility] Adult Case Management Discharge Plan :  Late Entry for 06/14/12 - Patient was an unscheduled discharge.   Will you be returning to the same living situation after discharge: Yes,  Patient is returning to his home. At discharge, do you have transportation home?:Yes,  Patient to arrange transportation home Do you have the ability to pay for your medications:No.  Patient will be assisted with indigent medications.  Release of information consent forms completed and in the chart;  Patient's signature needed at discharge.  Patient to Follow up at: Follow-up Information   Follow up with [facility] On 06/20/2012. (Go to walkin clinic before 9AM on Tuesday April 9th)    Contact information:   [address]  [place], [place]  [address] [facility] [phone] Valinda Hoar [phone]      Patient denies SI/HI:   Yes,  Patient is not endorsing SI/HI or thoughts of self harm.    Safety Planning and Suicide Prevention discussed:  Yes,  Reviewed during aftercre group.  Wynn Banker 06/15/2012, 8:33 AM

## 2012-06-15 NOTE — Progress Notes (Signed)
Discharge Note  D: Patient's mood was appropriate to the circumstance.  A: Support and encouragement provided to patient. Scheduled medications administered per MD orders. Discharge instructions/prescriptions/medication samples given to patient. Returned belongings to patient.  R: Patient receptive. Patient verbalized understanding of discharge instructions and prescriptions. Denies SI/HI/AVH. Patient d/c to the front lobby without incident.

## 2012-06-15 NOTE — [facility] Suicide Risk Assessment (Signed)
Suicide Risk Assessment  Discharge Assessment     Demographic Factors:  Male  Mental Status Per Nursing Assessment::   On Admission:     Current Mental Status by Physician: In full contact with reality. There are no suicidal ideas plans or intent. He states that he is not hearing voices anymore. He is going to go back with his aunt and follow up on outpatient basis   Loss Factors: Loss of significant relationship  Historical Factors: Anniversary of important loss  Risk Reduction Factors:   Living with another person, especially a relative  Continued Clinical Symptoms:  Depression:   Comorbid alcohol abuse/dependence Alcohol/Substance Abuse/Dependencies  Cognitive Features That Contribute To Risk:  Closed-mindedness Thought constriction (tunnel vision)    Suicide Risk:  Minimal: No identifiable suicidal ideation.  Patients presenting with no risk factors but with morbid ruminations; may be classified as minimal risk based on the severity of the depressive symptoms  Discharge Diagnoses:   AXIS I:  Major Depression recurrent with psychotic features, PTSD, Alcohol Dependence AXIS II:  Deferred AXIS III:   Past Medical History  Diagnosis Date  . Asthma   . HIV (human immunodeficiency virus infection)   . Mental disorder   . Anxiety   . Depression   . Shortness of breath    AXIS IV:  other psychosocial or environmental problems AXIS V:  61-70 mild symptoms  Plan Of Care/Follow-up recommendations:  Activity:  as tolerated Diet:  regular Continue follow up at [facility] Is patient on multiple antipsychotic therapies at discharge:  No   Has Patient had three or more failed trials of antipsychotic monotherapy by history:  No  Recommended Plan for Multiple Antipsychotic Therapies: N/A   Brytnee Bechler A 06/15/2012, 11:44 AM

## 2012-06-15 NOTE — Discharge Summary (Signed)
Physician Discharge Summary Note  Patient:  John [name] is an 42 y.o., male MRN:  [id number] DOB:  [birth year] Patient phone:  [phone] (home)  Patient address:   [address] [place] [address],   Date of Admission:  06/06/2012  Date of Discharge: 06/15/12  Reason for Admission:  Increased alcohol consumption  Discharge Diagnoses: Principal Problem:   Severe major depression with psychotic features Active Problems:   Alcohol dependence   Major depressive disorder, recurrent episode, moderate   PTSD (post-traumatic stress disorder)  Review of Systems  Constitutional: Negative.   HENT: Negative.   Eyes: Negative.   Respiratory: Negative.   Cardiovascular: Negative.   Gastrointestinal: Negative.   Genitourinary: Negative.   Musculoskeletal: Negative.   Skin: Negative.   Neurological: Negative.   Endo/Heme/Allergies: Negative.   Psychiatric/Behavioral: Positive for depression (Stabilized with medication prior to discharge) and substance abuse (Hx alcoholism). Negative for suicidal ideas, hallucinations and memory loss. The patient has insomnia (Stabilized with medication prior to discharge). The patient is not nervous/anxious.    Axis Diagnosis:   AXIS I:  Severe major depression with psychotic features, Alcohol dependence AXIS II:  Deferred AXIS III:   Past Medical History  Diagnosis Date  . Asthma   . HIV (human immunodeficiency virus infection)   . Mental disorder   . Anxiety   . Depression   . Shortness of breath    AXIS IV:  other psychosocial or environmental problems and Substance absue issues AXIS V:  64  Level of Care:  OP  Hospital Course:  This is an admission assessment for this 42 year old African-American male. Admitted to [facility] from the [facility] hospital ED with complaints of increased alcohol consumption with requests for detoxification treatment. Patient reports, "I took the city bus to the [facility] ED yesterday. I needed  alcohol and marijuana detox. I started drinking alcohol at 15 and smoking weed at 17. I drink a half gallon of vodka daily. Drinking helps me deal with my problems. It helps my depression, this way, I don't think or worry about stuff. I have been in so much trouble with the law when intoxicated. I have had charges ranging from breaking/entering, stealing tuff to sell to make money for my liquor. I would like to go to a residential treatment place after detox. My depression right now is at #10. I am HIV positive. I was diagnosed few years ago. I am in a program here in [place] where I'm being monitored to see how my system is doing. I was told that right now, I do not need any treatment yet. I do hear voices conversing inside my head all the time".  After admission assessment and evaluation, it was determined that John [name] will need detoxification treatment protocol to stabilize his system of alcohol intoxication and to combat the withdrawal symptoms of alcohol/other substance found in his his system per UDS report. And his discharge plans included a referral/appointement to an outpatient psychiatric clinic for continuation of psychiatric care. John [name] was then started on Librium protocol for his alcohol detoxification. He was also enrolled in group counseling sessions and activities where he received counseling and learned coping skills that should help him after discharge to cope better, manage his substance abuse problems to maintain a much longer sobriety.   Besides the detoxification protocol, patient also received Gabapentin 100 mg for anxiety, Citalopram 20 mg Q daily for depression, Risperdal 1.5 mg for mood control and Trazodone 100 mg Q  bedtime for sleep. He was also was enrolled/attended AA/NA meetings being offered and held on this unit. He has some previously existing and or identifiable medical conditions that required treatment and or monitoring. He received medication management for all  those health issues as well. He was monitored closely for any potential problems that may arise as a result of and or during detoxification treatment. Patient tolerated his treatment regimen and detoxification treatment without any significant adverse effects and or reactions reported.  Patient attended treatment team meeting this am and met with the treatment team members. His reason for admission, present symptoms, substance abuse issues, response to treatment and discharge plans discussed. Patient endorsed that he is doing well and stable for discharge to pursue the next phase of his substance abuse treatment. It was agreed upon that he will continue psychiatric care on outpatient basis at [facility] here in [place], [place] on 06/20/12 by 09:00 am. The address, date, time and contact information for [facility] provided for patient in person.  Besides routine psychiatric care/medication management, John [name] is encouraged to join/attend AA/NA meetings being offered and held within his community. He is instructed and encouraged to get a trusted sponsor from the advise of others or from whomever within the AA meetings seems to make sense, and who has a proven track record, and will hold him responsible for his sobriety, and both expects and insists on his total  abstinence from alcohol. He must focus the first of each month on the speaker meetings where he will specifically look at how his life has been wrecked by drugs/alcohol and how his life has been similar to that of the speaker's life.   Upon discharge, patient adamantly denies suicidal, homicidal ideations, auditory, visual hallucinations, delusional thinking and or withdrawal symptoms. Patient left [facility] with all personal belongings in no apparent distress. He received 2 weeks worth samples of his discharge medications. Transportation per bus, and bus fare/voucher provided by [facility].   Consults:  None  Significant Diagnostic Studies:  labs: CBC with diff,  CMP, UDS, toxicology tests, U/A  Discharge Vitals:   Blood pressure 121/75, pulse 109, temperature 98.1 F (36.7 C), temperature source Oral, resp. rate 18, height 5\' 7"  (1.702 m), weight 79.833 kg (176 lb), SpO2 96.00%. Body mass index is 27.56 kg/(m^2). Lab Results:   No results found for this or any previous visit (from the past 72 hour(s)).  Physical Findings: AIMS: Facial and Oral Movements Muscles of Facial Expression: None, normal Lips and Perioral Area: None, normal Jaw: None, normal Tongue: None, normal,Extremity Movements Upper (arms, wrists, hands, fingers): None, normal Lower (legs, knees, ankles, toes): None, normal, Trunk Movements Neck, shoulders, hips: None, normal, Overall Severity Severity of abnormal movements (highest score from questions above): None, normal Incapacitation due to abnormal movements: None, normal Patient's awareness of abnormal movements (rate only patient's report): No Awareness, Dental Status Current problems with teeth and/or dentures?: Yes (missing teeth, no problems eating) Does patient usually wear dentures?: No  CIWA:  CIWA-Ar Total: 1 COWS:  COWS Total Score: 1  Psychiatric Specialty Exam: See Psychiatric Specialty Exam and Suicide Risk Assessment completed by Attending Physician prior to discharge.  Discharge destination:  Home  Is patient on multiple antipsychotic therapies at discharge:  No   Has Patient had three or more failed trials of antipsychotic monotherapy by history:  No  Recommended Plan for Multiple Antipsychotic Therapies: NA     Medication List    STOP taking these medications  ALPRAZolam 0.25 MG tablet  Commonly known as:  XANAX     predniSONE 20 MG tablet  Commonly known as:  DELTASONE      TAKE these medications     Indication   albuterol 108 (90 BASE) MCG/ACT inhaler  Commonly known as:  PROVENTIL HFA;VENTOLIN HFA  Inhale 2 puffs into the lungs every 6 (six) hours as needed for shortness of  breath.   Indication:  Asthma, Chronic Obstructive Lung Disease     baclofen 10 MG tablet  Commonly known as:  LIORESAL  Take 1 tablet (10 mg total) by mouth 3 (three) times daily. For muscle spasms/cramps   Indication:  Muscle Cramps, Muscle Spasticity     citalopram 20 MG tablet  Commonly known as:  CELEXA  Take 1 tablet (20 mg total) by mouth daily. For depression   Indication:  Depression     Fluticasone-Salmeterol 100-50 MCG/DOSE Aepb  Commonly known as:  ADVAIR  Inhale 1 puff into the lungs every morning. For Asthma   Indication:  Asthma, Chronic Obstructive Lung Disease     risperiDONE 0.5 MG tablet  Commonly known as:  RISPERDAL  Take 3 tablets (1.5 mg total) by mouth at bedtime. For mood control   Indication:  Easily Angered or Annoyed, psychosis     traZODone 100 MG tablet  Commonly known as:  DESYREL  Take 1 tablet (100 mg total) by mouth at bedtime and may repeat dose one time if needed. For depression/sleep   Indication:  Trouble Sleeping, Major Depressive Disorder       Follow-up Information   Follow up with [facility] On 06/20/2012. (Go to walkin clinic before 9AM on Tuesday April 9th)    Contact information:   [address]  [place], [place]  [address] [facility] [phone] FAX [phone]     Follow-up recommendations:  Activity:  As tolerated Diet: As recommended by your primary care doctor. Keep all scheduled follow-up appointments as recommended.  Comments: Take all your medications as prescribed by your mental healthcare provider. Report any adverse effects and or reactions from your medicines to your outpatient provider promptly. Patient is instructed and cautioned to not engage in alcohol and or illegal drug use while on prescription medicines. In the event of worsening symptoms, patient is instructed to call the crisis hotline, 911 and or go to the nearest ED for appropriate evaluation and treatment of symptoms. Follow-up with your primary care provider for  your other medical issues, concerns and or health care needs.  Continue to work a relapse prevention plan Total Discharge Time:  Greater than 30 minutes.  SignedArmandina Stammer I 06/15/2012, 12:19 PM

## 2012-06-15 NOTE — Progress Notes (Signed)
[facility] Adult Case Management Discharge Plan :  Will you be returning to the same living situation after discharge: Yes,  with aunt At discharge, do you have transportation home?:Yes,  two bus vouchers Do you have the ability to pay for your medications:Yes,  through reduced cost pharmacy at [facility]  Release of information consent forms completed and in the chart;  Patient's signature needed at discharge.  Patient to Follow up at: Follow-up Information   Follow up with [facility] On 06/20/2012. (Go to walkin clinic before 9AM on Tuesday April 9th)    Contact information:   [address]  [place], [place]  [address] [facility] [phone] Valinda Hoar [phone]      Patient denies SI/HI:   Yes,  denies both    Safety Planning and Suicide Prevention discussed:  Yes,  with patient  Clide Dales 06/15/2012, 1:13 PM

## 2012-06-19 ENCOUNTER — Telehealth: Payer: Self-pay | Admitting: Internal Medicine

## 2012-06-19 NOTE — Telephone Encounter (Signed)
Received a fax from [facility]. Mr. Nardozzi has been approved for patient assistance for his ProAir.

## 2012-06-20 NOTE — Progress Notes (Signed)
Patient Discharge Instructions:  After Visit Summary (AVS): Faxed to: 06/20/12  Discharge Summary Note: Faxed to: 06/20/12  Psychiatric Admission Assessment Note: Faxed to: 06/20/12  Suicide Risk Assessment - Discharge Assessment: Faxed to: 06/20/12  Faxed/Sent to the Next Level Care provider: 06/20/12 Faxed to [facility] @ [phone]  Jerelene Redden, 06/20/2012, 3:59 PM

## 2012-06-27 ENCOUNTER — Encounter (HOSPITAL_COMMUNITY): Payer: Self-pay | Admitting: Emergency Medicine

## 2012-06-27 ENCOUNTER — Emergency Department (HOSPITAL_COMMUNITY)
Admission: EM | Admit: 2012-06-27 | Discharge: 2012-06-28 | Disposition: A | Discharge status: Home / Self Care | Payer: No Typology Code available for payment source | Attending: Emergency Medicine | Admitting: Emergency Medicine

## 2012-06-27 DIAGNOSIS — F4310 Post-traumatic stress disorder, unspecified: Secondary | ICD-10-CM | POA: Insufficient documentation

## 2012-06-27 DIAGNOSIS — J45901 Unspecified asthma with (acute) exacerbation: Secondary | ICD-10-CM | POA: Insufficient documentation

## 2012-06-27 DIAGNOSIS — F3289 Other specified depressive episodes: Secondary | ICD-10-CM | POA: Insufficient documentation

## 2012-06-27 DIAGNOSIS — B20 Human immunodeficiency virus [HIV] disease: Secondary | ICD-10-CM

## 2012-06-27 DIAGNOSIS — F329 Major depressive disorder, single episode, unspecified: Secondary | ICD-10-CM | POA: Insufficient documentation

## 2012-06-27 DIAGNOSIS — F17200 Nicotine dependence, unspecified, uncomplicated: Secondary | ICD-10-CM | POA: Insufficient documentation

## 2012-06-27 DIAGNOSIS — F411 Generalized anxiety disorder: Secondary | ICD-10-CM | POA: Insufficient documentation

## 2012-06-27 DIAGNOSIS — Z21 Asymptomatic human immunodeficiency virus [HIV] infection status: Secondary | ICD-10-CM | POA: Insufficient documentation

## 2012-06-27 DIAGNOSIS — F1910 Other psychoactive substance abuse, uncomplicated: Secondary | ICD-10-CM | POA: Insufficient documentation

## 2012-06-27 DIAGNOSIS — Z79899 Other long term (current) drug therapy: Secondary | ICD-10-CM | POA: Insufficient documentation

## 2012-06-27 DIAGNOSIS — Z87828 Personal history of other (healed) physical injury and trauma: Secondary | ICD-10-CM | POA: Insufficient documentation

## 2012-06-27 DIAGNOSIS — F10229 Alcohol dependence with intoxication, unspecified: Secondary | ICD-10-CM | POA: Insufficient documentation

## 2012-06-27 DIAGNOSIS — F323 Major depressive disorder, single episode, severe with psychotic features: Secondary | ICD-10-CM | POA: Insufficient documentation

## 2012-06-27 DIAGNOSIS — F1020 Alcohol dependence, uncomplicated: Secondary | ICD-10-CM

## 2012-06-27 LAB — COMPREHENSIVE METABOLIC PANEL
Alkaline Phosphatase: 87 U/L (ref 39–117)
BUN: 8 mg/dL (ref 6–23)
CO2: 23 mEq/L (ref 19–32)
Chloride: 103 mEq/L (ref 96–112)
Creatinine, Ser: 0.73 mg/dL (ref 0.50–1.35)
GFR calc Af Amer: >90 mL/min (ref >90)
GFR calc non Af Amer: >90 mL/min (ref >90)
Glucose, Bld: 134 mg/dL — ABNORMAL HIGH (ref 70–99)
Potassium: 3.3 mEq/L — ABNORMAL LOW (ref 3.5–5.1)
Total Bilirubin: 0.4 mg/dL (ref 0.3–1.2)

## 2012-06-27 LAB — CBC
HCT: 42 % (ref 39.0–52.0)
Hemoglobin: 14.7 g/dL (ref 13.0–17.0)
MCV: 92.7 fL (ref 78.0–100.0)
WBC: 6.1 10*3/uL (ref 4.0–10.5)

## 2012-06-27 LAB — ETHANOL: Alcohol, Ethyl (B): 317 mg/dL — ABNORMAL HIGH (ref 0–11)

## 2012-06-27 LAB — ACETAMINOPHEN LEVEL: Acetaminophen (Tylenol), Serum: <15 ug/mL (ref 10–30)

## 2012-06-27 LAB — SALICYLATE LEVEL: Salicylate Lvl: <2 mg/dL — ABNORMAL LOW (ref 2.8–20.0)

## 2012-06-27 MED ORDER — LORAZEPAM 1 MG PO TABS: 0.0000 mg | ORAL_TABLET | Freq: Two times a day (BID) | ORAL | Status: DC

## 2012-06-27 MED ORDER — ONDANSETRON HCL 4 MG PO TABS: 4.0000 mg | ORAL_TABLET | Freq: Three times a day (TID) | ORAL | Status: DC | PRN

## 2012-06-27 MED ORDER — IBUPROFEN 600 MG PO TABS: 600.0000 mg | ORAL_TABLET | Freq: Three times a day (TID) | ORAL | Status: DC | PRN

## 2012-06-27 MED ORDER — VITAMIN B-1 100 MG PO TABS
100.0000 mg | ORAL_TABLET | Freq: Every day | ORAL | Status: DC
  Administered 2012-06-28: 100 mg via ORAL
  Filled 2012-06-27: qty 1

## 2012-06-27 MED ORDER — TRAZODONE HCL 100 MG PO TABS: 100.0000 mg | ORAL_TABLET | Freq: Every evening | ORAL | Status: DC | PRN

## 2012-06-27 MED ORDER — FOLIC ACID 1 MG PO TABS
1.0000 mg | ORAL_TABLET | Freq: Every day | ORAL | Status: DC
  Administered 2012-06-28: 1 mg via ORAL
  Filled 2012-06-27: qty 1

## 2012-06-27 MED ORDER — LORAZEPAM 1 MG PO TABS
0.0000 mg | ORAL_TABLET | Freq: Four times a day (QID) | ORAL | Status: DC
  Administered 2012-06-28: 2 mg via ORAL
  Administered 2012-06-28: 1 mg via ORAL
  Filled 2012-06-27: qty 1
  Filled 2012-06-27: qty 2

## 2012-06-27 MED ORDER — ALUM & MAG HYDROXIDE-SIMETH 200-200-20 MG/5ML PO SUSP: 30.0000 mL | ORAL | Status: DC | PRN

## 2012-06-27 MED ORDER — POTASSIUM CHLORIDE CRYS ER 20 MEQ PO TBCR
40.0000 meq | EXTENDED_RELEASE_TABLET | Freq: Once | ORAL | Status: DC
  Filled 2012-06-27: qty 2

## 2012-06-27 MED ORDER — NICOTINE 21 MG/24HR TD PT24
21.0000 mg | MEDICATED_PATCH | Freq: Every day | TRANSDERMAL | Status: DC
  Administered 2012-06-28: 21 mg via TRANSDERMAL
  Filled 2012-06-27: qty 1

## 2012-06-27 MED ORDER — THIAMINE HCL 100 MG/ML IJ SOLN: 100.0000 mg | Freq: Every day | INTRAMUSCULAR | Status: DC

## 2012-06-27 MED ORDER — ALBUTEROL SULFATE HFA 108 (90 BASE) MCG/ACT IN AERS: 2.0000 | INHALATION_SPRAY | Freq: Four times a day (QID) | RESPIRATORY_TRACT | Status: DC | PRN

## 2012-06-27 MED ORDER — RISPERIDONE 1 MG PO TABS
1.5000 mg | ORAL_TABLET | Freq: Every day | ORAL | Status: DC
  Administered 2012-06-28: 1.5 mg via ORAL
  Filled 2012-06-27: qty 1

## 2012-06-27 MED ORDER — CITALOPRAM HYDROBROMIDE 20 MG PO TABS
20.0000 mg | ORAL_TABLET | Freq: Every day | ORAL | Status: DC
  Administered 2012-06-28: 20 mg via ORAL
  Filled 2012-06-27 (×2): qty 1

## 2012-06-27 MED ORDER — ADULT MULTIVITAMIN W/MINERALS CH
1.0000 | ORAL_TABLET | Freq: Every day | ORAL | Status: DC
  Administered 2012-06-28: 1 via ORAL
  Filled 2012-06-27: qty 1

## 2012-06-27 MED ORDER — MOMETASONE FURO-FORMOTEROL FUM 100-5 MCG/ACT IN AERO
2.0000 | INHALATION_SPRAY | Freq: Two times a day (BID) | RESPIRATORY_TRACT | Status: DC
  Administered 2012-06-28: 2 via RESPIRATORY_TRACT
  Filled 2012-06-27: qty 8.8

## 2012-06-27 MED ORDER — ZOLPIDEM TARTRATE 5 MG PO TABS: 5.0000 mg | ORAL_TABLET | Freq: Every evening | ORAL | Status: DC | PRN

## 2012-06-27 NOTE — ED Notes (Signed)
Pt notified x2 that urine is needed

## 2012-06-27 NOTE — ED Provider Notes (Signed)
History     CSN: [id number]  Arrival date & time 06/27/12  1914   First MD Initiated Contact with Patient 06/27/12 1928      Chief Complaint  Patient presents with  . Medical Clearance    (Consider location/radiation/quality/duration/timing/severity/associated sxs/prior treatment) Patient is a 42 y.o. male presenting with drug/alcohol assessment. The history is provided by the patient. No language interpreter was used.  Drug / Alcohol Assessment Similar prior episodes: yes   Severity:  Moderate Onset quality:  Gradual Timing:  Constant Progression:  Unchanged Chronicity:  Recurrent Suspected agents:  Alcohol and marijuana Associated symptoms: hallucinations and suicidal ideation   Associated symptoms: no abdominal pain, no headaches, no nausea, no shortness of breath and no vomiting   Risk factors: addiction treatment, mental illness and psychiatric hx     John [name] is a 42 y.o. male  with a hx of EtOH abuse, asthma, HIV, anxiety, depression and and  presents to the Emergency Department complaining of gradual, persistent, progressively worsening suicidal ideations onset since this afternoon, but did not make a plan to kill himself today. Pt has a Hx a suicide attempt by cutting himself.  Associated symptoms today include visual and auditory hallucinations, constipation.  He states that he has been binge drinking and smoking marijuana.  Pt has been drinking a half gallon of vodka each day.  Pt sates he hears voices which give the pt commands to kill himself by jumping off the bridge.  Pt sees floating heads, eyeballs and "other crazy things." Nothing makes it better and drinking and marajuana makes it worse.  Pt has been seen by [facility] who prescribes his medications, but he has not had any in 2 weeks.  Pt denies fever, chills, headache, chest pain, SOB, abdominal pain, N/V/D, weakness, dizziness, syncope, dysuria.  Pt states he has been under significant personal stress lately.     Past Medical History  Diagnosis Date  . Asthma   . HIV (human immunodeficiency virus infection)   . Mental disorder   . Anxiety   . Depression   . Shortness of breath     Past Surgical History  Procedure Laterality Date  . Skin graft full thickness leg      POSTERIOR LEFT LEG  AFTER DOG BITES    No family history on file.  History  Substance Use Topics  . Smoking status: Current Every Day Smoker -- 0.50 packs/day for 25 years    Types: Cigarettes  . Smokeless tobacco: Never Used  . Alcohol Use: 0.0 oz/week     Comment: drinking 1.5 fifths of vodka, beer 12-pack /day      Review of Systems  Constitutional: Negative for fever, diaphoresis, appetite change, fatigue and unexpected weight change.  HENT: Negative for mouth sores and neck stiffness.   Eyes: Negative for visual disturbance.  Respiratory: Negative for cough, chest tightness, shortness of breath and wheezing.   Cardiovascular: Negative for chest pain.  Gastrointestinal: Negative for nausea, vomiting, abdominal pain, diarrhea and constipation.  Endocrine: Negative for polydipsia, polyphagia and polyuria.  Genitourinary: Negative for dysuria, urgency, frequency and hematuria.  Musculoskeletal: Negative for back pain.  Skin: Negative for rash.  Allergic/Immunologic: Negative for immunocompromised state.  Neurological: Negative for syncope, light-headedness and headaches.  Hematological: Does not bruise/bleed easily.  Psychiatric/Behavioral: Positive for suicidal ideas and hallucinations. Negative for sleep disturbance. The patient is nervous/anxious.     Allergies  Shellfish allergy  Home Medications   Current Outpatient Rx  Name  Route  Sig  Dispense  Refill  . albuterol (PROVENTIL HFA;VENTOLIN HFA) 108 (90 BASE) MCG/ACT inhaler   Inhalation   Inhale 2 puffs into the lungs every 6 (six) hours as needed for shortness of breath.         . citalopram (CELEXA) 20 MG tablet   Oral   Take 1 tablet (20  mg total) by mouth daily. For depression   30 tablet   0   . Fluticasone-Salmeterol (ADVAIR) 100-50 MCG/DOSE AEPB   Inhalation   Inhale 1 puff into the lungs every morning. For Asthma   60 each      . risperiDONE (RISPERDAL) 0.5 MG tablet   Oral   Take 3 tablets (1.5 mg total) by mouth at bedtime. For mood control   45 tablet   0   . traZODone (DESYREL) 100 MG tablet   Oral   Take 1 tablet (100 mg total) by mouth at bedtime and may repeat dose one time if needed. For depression/sleep   60 tablet   0     BP 104/53  Pulse 99  Temp(Src) 98.6 F (37 C) (Oral)  Resp 28  SpO2 90%  Physical Exam  Nursing note and vitals reviewed. Constitutional: He is oriented to person, place, and time. He appears well-developed and well-nourished. No distress.  HENT:  Head: Normocephalic and atraumatic.  Mouth/Throat: Oropharynx is clear and moist. No oropharyngeal exudate.  Eyes: Conjunctivae are normal. No scleral icterus.  Neck: Normal range of motion. Neck supple.  Cardiovascular: Normal rate, regular rhythm and intact distal pulses.   Pulmonary/Chest: Effort normal and breath sounds normal. No respiratory distress. He has no wheezes.  Abdominal: Soft. Bowel sounds are normal. He exhibits no mass. There is no tenderness. There is no rebound and no guarding.  Musculoskeletal: Normal range of motion. He exhibits no edema.  Neurological: He is alert and oriented to person, place, and time. He exhibits normal muscle tone. Coordination normal.  Speech is clear and goal oriented Moves extremities without ataxia  Skin: Skin is warm and dry. He is not diaphoretic.  Psychiatric: His mood appears anxious. His speech is delayed. He is agitated, aggressive and actively hallucinating. Cognition and memory are normal. He expresses suicidal ideation. He expresses no homicidal ideation. He expresses suicidal plans. He expresses no homicidal plans.    ED Course  Procedures (including critical care  time)  Labs Reviewed  COMPREHENSIVE METABOLIC PANEL - Abnormal; Notable for the following:    Potassium 3.3 (*)    Glucose, Bld 134 (*)    AST 64 (*)    All other components within normal limits  ETHANOL - Abnormal; Notable for the following:    Alcohol, Ethyl (B) 317 (*)    All other components within normal limits  SALICYLATE LEVEL - Abnormal; Notable for the following:    Salicylate Lvl <2.0 (*)    All other components within normal limits  ACETAMINOPHEN LEVEL  CBC  URINE RAPID DRUG SCREEN (HOSP PERFORMED)   No results found.   1. Suicidal ideation   2. PTSD (post-traumatic stress disorder)   3. Severe major depression with psychotic features   4. Alcohol dependence   5. HIV INFECTION       MDM  John [name] presents with EtOH intoxication, drug and EtOH abuse and Suicidal ideation without a plan.  In addition pt has auditory command and visual hallucinations.   Patient presents to the ER with a number of risk factors for suicide for example the  patient has a history of Depression, substance abuse, insomnia, past suicide attempts, self injury, has been off his medications. In addition the patient has several protective factors for example the patient does not appear to be psychotic, is here voluntarily, is speaking openly about their current situation.  Under these circumstances I would conservatively estimate the suicide risk to be high. Current Plan is to have patient be evaluated by [facility] for further assessment on weather or not to be placed inpatient.  We have discussed that If the patient feels he was becoming unsafe, instead of acting on an impulse of self harm he will contact the crisis line or speak to the RN about it.   Patient has been cleared to move to [facility] after he becomes clinically sober.   Pt care has been transferred to [facility], PA-C who will follow.  Psyc holding orders and med rec have been completed.         Dahlia Client Helane Briceno, PA-C 06/27/12  2350

## 2012-06-27 NOTE — ED Notes (Signed)
Pt's belongings placed in bag and labeled (jeans, tee shirt, black shoes, black and white belt.  Placed under the nurses station across from room 4.  Security notified that pt needs to be wanded

## 2012-06-27 NOTE — ED Notes (Signed)
Upon entering room and introducing this writer to pt and requesting blood draw, pt became very agitated and threatening to leave. RN in room and made aware

## 2012-06-27 NOTE — ED Notes (Signed)
Notified Dr. Radford Pax that pt is passed out drunk and not providing urine sample

## 2012-06-27 NOTE — ED Notes (Addendum)
Patient with history of HIV, bipolar, schizophrenia, depression, and anxiety here for non-compliance of medications.  His family called EMS for agitation, crying, and combativeness.  EMS reports he became very agitated with them.    Patient is not IVC'd but is here voluntarily.  Patient is not suicidal.

## 2012-06-27 NOTE — ED Notes (Signed)
Urinal placed at bedside and pt notified that sample is needed

## 2012-06-27 NOTE — ED Notes (Signed)
Attempted to give potassium to patient per order.  Patient sleeping soundly, unable to wake.

## 2012-06-27 NOTE — ED Notes (Signed)
RUE:[id number]<JW> Expected date:<BR> Expected time:<BR> Means of arrival:<BR> Comments:<BR> Medical clearence

## 2012-06-28 DIAGNOSIS — F1910 Other psychoactive substance abuse, uncomplicated: Secondary | ICD-10-CM

## 2012-06-28 DIAGNOSIS — F1020 Alcohol dependence, uncomplicated: Secondary | ICD-10-CM

## 2012-06-28 LAB — RAPID URINE DRUG SCREEN, HOSP PERFORMED
Amphetamines: NOT DETECTED
Barbiturates: NOT DETECTED
Tetrahydrocannabinol: POSITIVE — AB

## 2012-06-28 MED ORDER — ALBUTEROL SULFATE (5 MG/ML) 0.5% IN NEBU
5.0000 mg | INHALATION_SOLUTION | Freq: Once | RESPIRATORY_TRACT | Status: AC
  Administered 2012-06-28: 5 mg via RESPIRATORY_TRACT
  Filled 2012-06-28: qty 1

## 2012-06-28 NOTE — ED Provider Notes (Signed)
Medical screening examination/treatment/procedure(s) were performed by non-physician practitioner and as supervising physician I was immediately available for consultation/collaboration.  Olivia Mackie, MD 06/28/12 [phone]

## 2012-06-28 NOTE — Progress Notes (Signed)
CSW spoke with [facility], and no detox beds available until Friday.  Pt pending referral to [facility].   Marland KitchenCatha [name], John [name]  [phone] .06/28/2012 1404pm

## 2012-06-28 NOTE — BH Assessment (Signed)
Assessment Note   John [name] is a 42 y.o. male who presents to [facility] for detox from alcohol and THC.  Pt denies SI/HI/Psych.  Pt consumes 1/5 Vodka, daily, last intake was 06/27/12.  Pt says he drank 1/5 of Vodka.  Pt also drinks 2-40's, daily; did not have any beer on 06/27/12.  Pt uses a bowl of THC, daily, last use was 06/25/12, 1 "joint'.  Pt recently had detox treatment with [facility] in 05/2012.  Pt states did not follow up with d/c instructions, provides no reason to this counselor as to the reason for non compliance with follow up.  Pt denies any w/d sxs at this time, no issues with seizures, blackouts only.  Pt has no legal issues pending at this time.    Axis I: Alcohol Dependence; Cannabis Abuse  Axis II: Deferred Axis III:  Past Medical History  Diagnosis Date  . Asthma   . HIV (human immunodeficiency virus infection)   . Mental disorder   . Anxiety   . Depression   . Shortness of breath    Axis IV: economic problems, housing problems, other psychosocial or environmental problems, problems related to social environment and problems with primary support group Axis V: 51-60 moderate symptoms  Past Medical History:  Past Medical History  Diagnosis Date  . Asthma   . HIV (human immunodeficiency virus infection)   . Mental disorder   . Anxiety   . Depression   . Shortness of breath     Past Surgical History  Procedure Laterality Date  . Skin graft full thickness leg      POSTERIOR LEFT LEG  AFTER DOG BITES    Family History: No family history on file.  Social History:  reports that he has been smoking Cigarettes.  He has a 12.5 pack-year smoking history. He has never used smokeless tobacco. He reports that  drinks alcohol. He reports that he uses illicit drugs (Marijuana) about 7 times per week.  Additional Social History:  Alcohol / Drug Use Pain Medications: See MAR  Prescriptions: See MAR  Over the Counter: See MAR  Longest period of sobriety (when/how long): 1  month;;while in detox treatment with [facility]  Negative Consequences of Use: Personal relationships;Work / Hospital doctor Withdrawal Symptoms: Other (Comment) (No current w/d sxs) Substance #1 Name of Substance 1: Alcohol  1 - Age of First Use: Teens  1 - Amount (size/oz): 1/5 Vodka; 2-40's  1 - Frequency: Daily  1 - Duration: On-going  1 - Last Use / Amount: 06/27/12 Substance #2 Name of Substance 2: THC  2 - Age of First Use: Teens  2 - Amount (size/oz): "Dime" 2 - Frequency: Daily  2 - Duration: On-going  2 - Last Use / Amount: 06/25/12  CIWA: CIWA-Ar BP: 118/67 mmHg Pulse Rate: 102 Nausea and Vomiting: no nausea and no vomiting Tactile Disturbances: none Tremor: no tremor Auditory Disturbances: not present Paroxysmal Sweats: no sweat visible Visual Disturbances: not present Anxiety: no anxiety, at ease Headache, Fullness in Head: none present Agitation: normal activity Orientation and Clouding of Sensorium: oriented and can do serial additions CIWA-Ar Total: 0 COWS:    Allergies:  Allergies  Allergen Reactions  . Shellfish Allergy Anaphylaxis and Swelling    Home Medications:  (Not in a hospital admission)  OB/GYN Status:  No LMP for male patient.  General Assessment Data Location of Assessment: [facility] Living Arrangements: Other (Comment) (Homeless ) Can pt return to current living arrangement?: Yes Admission Status: Voluntary Is  patient capable of signing voluntary admission?: Yes Transfer from: Acute Hospital Referral Source: MD  Education Status Is patient currently in school?: No Current Grade: None  Highest grade of school patient has completed: None  Name of school: None  Contact person: None   Risk to self Suicidal Ideation: No Suicidal Intent: No Is patient at risk for suicide?: No Suicidal Plan?: No Access to Means: No What has been your use of drugs/alcohol within the last 12 months?: Abusing alcohol/THC  Previous Attempts/Gestures:  Yes How many times?: 2 (Past Gestures--tried to walk into traffic ) Other Self Harm Risks: None Triggers for Past Attempts: Unpredictable Intentional Self Injurious Behavior: None Comment - Self Injurious Behavior: None  Family Suicide History: No Recent stressful life event(s): Financial Problems;Trauma (Comment) (Homeless, Health, Chronic SA; ) Persecutory voices/beliefs?: No Depression: Yes Depression Symptoms: Loss of interest in usual pleasures Substance abuse history and/or treatment for substance abuse?: Yes Suicide prevention information given to non-admitted patients: Not applicable  Risk to Others Homicidal Ideation: No Thoughts of Harm to Others: No Current Homicidal Intent: No Current Homicidal Plan: No Access to Homicidal Means: No Identified Victim: None  History of harm to others?: No Assessment of Violence: None Noted Violent Behavior Description: None  Does patient have access to weapons?: No Criminal Charges Pending?: No Does patient have a court date: No  Psychosis Hallucinations: None noted Delusions: None noted  Mental Status Report Appear/Hygiene: Disheveled;Poor hygiene;Body odor Eye Contact: Fair Motor Activity: Unremarkable Speech: Logical/coherent Level of Consciousness: Alert Mood: Depressed Affect: Depressed Anxiety Level: None Thought Processes: Coherent;Relevant Judgement: Unimpaired Orientation: Person;Place;Time;Situation Obsessive Compulsive Thoughts/Behaviors: None  Cognitive Functioning Concentration: Decreased Memory: Recent Intact;Remote Intact IQ: Average Insight: Fair Impulse Control: Fair Appetite: Poor Weight Loss:  (Unk ) Weight Gain: 0 Sleep: Decreased Total Hours of Sleep: 5 Vegetative Symptoms: None  ADLScreening [facility] Assessment Services) Patient's cognitive ability adequate to safely complete daily activities?: Yes Patient able to express need for assistance with ADLs?: Yes Independently performs ADLs?: Yes  (appropriate for developmental age)  Abuse/Neglect [facility]) Physical Abuse: Denies Verbal Abuse: Denies Sexual Abuse: Denies  Prior Inpatient Therapy Prior Inpatient Therapy: Yes Prior Therapy Dates: 05/2012 Prior Therapy Facilty/Provider(s): [facility]  Reason for Treatment: Detox   Prior Outpatient Therapy Prior Outpatient Therapy: No Prior Therapy Dates: None  Prior Therapy Facilty/Provider(s): None  Reason for Treatment: None   ADL Screening (condition at time of admission) Patient's cognitive ability adequate to safely complete daily activities?: Yes Patient able to express need for assistance with ADLs?: Yes Independently performs ADLs?: Yes (appropriate for developmental age) Weakness of Legs: None Weakness of Arms/Hands: None  Home Assistive Devices/Equipment Home Assistive Devices/Equipment: None  Therapy Consults (therapy consults require a physician order) PT Evaluation Needed: No OT Evalulation Needed: No SLP Evaluation Needed: No Abuse/Neglect Assessment (Assessment to be complete while patient is alone) Physical Abuse: Denies Verbal Abuse: Denies Sexual Abuse: Denies Exploitation of patient/patient's resources: Denies Self-Neglect: Denies Values / Beliefs Cultural Requests During Hospitalization: None Spiritual Requests During Hospitalization: None Consults Spiritual Care Consult Needed: No Social Work Consult Needed: No Merchant navy officer (For Healthcare) Advance Directive: Patient does not have advance directive;Patient would not like information Pre-existing out of facility DNR order (yellow form or pink MOST form): No Nutrition Screen- [facility] Patient's home diet: Regular Have you recently lost weight without trying?: No Have you been eating poorly because of a decreased appetite?: No Malnutrition Screening Tool Score: 0  Additional Information 1:1 In Past 12 Months?: No CIRT Risk:  No Elopement Risk: No Does patient have medical clearance?:  Yes     Disposition:  Disposition Initial Assessment Completed for this Encounter: Yes Disposition of Patient: Referred to ([facility] ) Type of inpatient treatment program: Adult Patient referred to: [facility]  On Site Evaluation by:   Reviewed with Physician:     Murrell Redden 06/28/2012 8:35 AM

## 2012-06-28 NOTE — Consult Note (Signed)
Reason for Consult: Substance-induced mood disorder, noncompliance with treatment Polysubstance abuse and alcohol intoxication Referring Physician: Dr. Hazle Nordmann is an 42 y.o. male.  HPI: Patient was seen and chart reviewed. Patient reported he was received the inpatient psychiatric hospitalization for detox treatment less than 3 weeks ago and able to stay sober one day and not able to get the rehabilitation services. Patient reported he was abusing alcohol, benzodiazepines and marijuana. Patient felt he was depressed after drinking alcohol along with friend and asked her friend to contact the 911 to get the help. Patient reported he need to have this done at the same time is not motivated to stop drinking. He is requesting for substance abuse treatment.  MSE: Patient was calm and cooperative. He has depressed mood and appropriate affect. He has normal speech and thought process. He has no evidence of psychosis. No SI/HI. He has poor insight, judgment and impulse control.  Past Medical History  Diagnosis Date  . Asthma   . HIV (human immunodeficiency virus infection)   . Mental disorder   . Anxiety   . Depression   . Shortness of breath     Past Surgical History  Procedure Laterality Date  . Skin graft full thickness leg      POSTERIOR LEFT LEG  AFTER DOG BITES    No family history on file.  Social History:  reports that he has been smoking Cigarettes.  He has a 12.5 pack-year smoking history. He has never used smokeless tobacco. He reports that  drinks alcohol. He reports that he uses illicit drugs (Marijuana) about 7 times per week.  Allergies:  Allergies  Allergen Reactions  . Shellfish Allergy Anaphylaxis and Swelling    Medications: I have reviewed the patient's current medications.  Results for orders placed during the hospital encounter of 06/27/12 (from the past 48 hour(s))  ACETAMINOPHEN LEVEL     Status: None   Collection Time    06/27/12  8:52 PM   Result Value Range   Acetaminophen (Tylenol), Serum <15.0  10 - 30 ug/mL   Comment:            THERAPEUTIC CONCENTRATIONS VARY     SIGNIFICANTLY. A RANGE OF 10-30     ug/mL MAY BE AN EFFECTIVE     CONCENTRATION FOR MANY PATIENTS.     HOWEVER, SOME ARE BEST TREATED     AT CONCENTRATIONS OUTSIDE THIS     RANGE.     ACETAMINOPHEN CONCENTRATIONS     >150 ug/mL AT 4 HOURS AFTER     INGESTION AND >50 ug/mL AT 12     HOURS AFTER INGESTION ARE     OFTEN ASSOCIATED WITH TOXIC     REACTIONS.  CBC     Status: None   Collection Time    06/27/12  8:52 PM      Result Value Range   WBC 6.1  4.0 - 10.5 K/uL   RBC 4.53  4.22 - 5.81 MIL/uL   Hemoglobin 14.7  13.0 - 17.0 g/dL   HCT 91.4  78.2 - 95.6 %   MCV 92.7  78.0 - 100.0 fL   MCH 32.5  26.0 - 34.0 pg   MCHC 35.0  30.0 - 36.0 g/dL   RDW 21.3  08.6 - 57.8 %   Platelets 355  150 - 400 K/uL  COMPREHENSIVE METABOLIC PANEL     Status: Abnormal   Collection Time    06/27/12  8:52 PM  Result Value Range   Sodium 142  135 - 145 mEq/L   Potassium 3.3 (*) 3.5 - 5.1 mEq/L   Chloride 103  96 - 112 mEq/L   CO2 23  19 - 32 mEq/L   Glucose, Bld 134 (*) 70 - 99 mg/dL   BUN 8  6 - 23 mg/dL   Creatinine, Ser 4.78  0.50 - 1.35 mg/dL   Calcium 8.6  8.4 - 29.5 mg/dL   Total Protein 7.4  6.0 - 8.3 g/dL   Albumin 3.8  3.5 - 5.2 g/dL   AST 64 (*) 0 - 37 U/L   ALT 46  0 - 53 U/L   Alkaline Phosphatase 87  39 - 117 U/L   Total Bilirubin 0.4  0.3 - 1.2 mg/dL   GFR calc non Af Amer >90  >90 mL/min   GFR calc Af Amer >90  >90 mL/min   Comment:            The eGFR has been calculated     using the CKD EPI equation.     This calculation has not been     validated in all clinical     situations.     eGFR's persistently     <90 mL/min signify     possible Chronic Kidney Disease.  ETHANOL     Status: Abnormal   Collection Time    06/27/12  8:52 PM      Result Value Range   Alcohol, Ethyl (B) 317 (*) 0 - 11 mg/dL   Comment:            LOWEST  DETECTABLE LIMIT FOR     SERUM ALCOHOL IS 11 mg/dL     FOR MEDICAL PURPOSES ONLY  SALICYLATE LEVEL     Status: Abnormal   Collection Time    06/27/12  8:52 PM      Result Value Range   Salicylate Lvl <2.0 (*) 2.8 - 20.0 mg/dL  ETHANOL     Status: Abnormal   Collection Time    06/28/12  4:12 AM      Result Value Range   Alcohol, Ethyl (B) 83 (*) 0 - 11 mg/dL   Comment:            LOWEST DETECTABLE LIMIT FOR     SERUM ALCOHOL IS 11 mg/dL     FOR MEDICAL PURPOSES ONLY  URINE RAPID DRUG SCREEN (HOSP PERFORMED)     Status: Abnormal   Collection Time    06/28/12  5:45 AM      Result Value Range   Opiates NONE DETECTED  NONE DETECTED   Cocaine NONE DETECTED  NONE DETECTED   Benzodiazepines POSITIVE (*) NONE DETECTED   Amphetamines NONE DETECTED  NONE DETECTED   Tetrahydrocannabinol POSITIVE (*) NONE DETECTED   Barbiturates NONE DETECTED  NONE DETECTED   Comment:            DRUG SCREEN FOR MEDICAL PURPOSES     ONLY.  IF CONFIRMATION IS NEEDED     FOR ANY PURPOSE, NOTIFY LAB     WITHIN 5 DAYS.                LOWEST DETECTABLE LIMITS     FOR URINE DRUG SCREEN     Drug Class       Cutoff (ng/mL)     Amphetamine      1000     Barbiturate      200  Benzodiazepine   200     Tricyclics       300     Opiates          300     Cocaine          300     THC              50    No results found.  Positive for anxiety, bad mood and excessive alcohol consumption Blood pressure 118/67, pulse 102, temperature 98.7 F (37.1 C), temperature source Oral, resp. rate 22, SpO2 97.00%.   Assessment/Plan: Polysubstance abuse Alcohol intoxication Noncompliant with the outpatient treatment program  Recommendation: Patient will be referred to the substance abuse residential treatment center. Patient is stable without withdrawal symptoms and may receive a Ativan as needed for alcohol shakes.  Jd Mccaster,JANARDHAHA R. 06/28/2012, 12:21 PM

## 2012-06-28 NOTE — BHH Counselor (Signed)
Writer met with patient to discuss his disposition regarding his request for detox. Informed patient that he was referred to [facility] this morning, however; recently learned that their are no detox beds available until this Friday.  Writer informed patient that since [facility] was not an option [facility] also offers the same detox service. Writer offered to make a referral to [facility] on patients behalf. Patient stated, "I don't really want to go into detox that far". Writer informed patient that [facility] was approx. 30-35 minutes away and SW could assist him with transportation to the facility if he is  accepted. Patient did not seem interested in the detox at [facility] and asked about other substance abuse options.   Writer informed patient about alternative programs including: CD-IOP, individual substance abuse therapy, AA, and residential programs. Patient stated, "I would rather do a CD-IOP outpatient program". Patient was given the appropriate referrals to various substance abuse programs including CD-IOP programs in the community. Writer specifically encouraged patient to follow up with [facility] which is a local substance abuse program that offers CD-IOP to indigent patients.  Pt discharged home to follow up. Patient denied any further concerns and was in agreeance with his discharge plans.

## 2012-06-28 NOTE — ED Provider Notes (Signed)
Patient care resumed from previous provider.  Patient with history of HIV, bipolar and schizophrenia as well as a history of medication noncompliance presented to emergency department intoxicated reporting suicidal ideations, hallucinations and want to detox.  Patient was unable to ambulate at time that I resumed care.  Plan is to allow patient to sleep off intoxication reevaluate.    5:36 AM Pt re-evaluated & on exam: hemodynamically stable BP 102/57  Pulse 96  Temp(Src) 98.4 F (36.9 C) (Oral)  Resp 18  SpO2 96% , NAD, heart w/ RRR, lungs CTAB, Chest & abd non-tender, no peripheral edema or calf tenderness.  Patient states that he is currently not having suicidal ideations but that he does feel more depressed than usual which has caused him to increase his alcohol abuse.  Patient currently requests detox.  He states that he's not having hallucinations at this time but has had increasing visual hallucinations lately.  Patient is here voluntarily.  Patient has been medically cleared to move back to behavioral health.   Jaci Carrel, [place] 06/28/12 [phone]

## 2012-06-28 NOTE — ED Provider Notes (Signed)
Medical screening examination/treatment/procedure(s) were performed by non-physician practitioner and as supervising physician I was immediately available for consultation/collaboration.   Treyten Monestime L Kelechi Orgeron, MD 06/28/12 1033 

## 2012-06-28 NOTE — BHH Counselor (Signed)
Writer contacted [facility] to inquire about bed availability. Spoke to [facility] and she verified that beds are available. Information faxed to the facility for [facility] to review. Writer will continue follow up with patients placement at [facility] for detox.

## 2012-06-30 ENCOUNTER — Encounter: Payer: Self-pay | Admitting: Infectious Disease

## 2012-07-12 ENCOUNTER — Ambulatory Visit: Payer: Self-pay

## 2012-07-13 ENCOUNTER — Emergency Department (HOSPITAL_COMMUNITY)
Admission: EM | Admit: 2012-07-13 | Discharge: 2012-07-13 | Disposition: A | Discharge status: Home / Self Care | Payer: Self-pay | Attending: Emergency Medicine | Admitting: Emergency Medicine

## 2012-07-13 ENCOUNTER — Ambulatory Visit: Payer: Self-pay | Admitting: *Deleted

## 2012-07-13 ENCOUNTER — Encounter (HOSPITAL_COMMUNITY): Payer: Self-pay | Admitting: Emergency Medicine

## 2012-07-13 DIAGNOSIS — F17200 Nicotine dependence, unspecified, uncomplicated: Secondary | ICD-10-CM | POA: Insufficient documentation

## 2012-07-13 DIAGNOSIS — R6889 Other general symptoms and signs: Secondary | ICD-10-CM | POA: Insufficient documentation

## 2012-07-13 DIAGNOSIS — F1020 Alcohol dependence, uncomplicated: Secondary | ICD-10-CM

## 2012-07-13 DIAGNOSIS — J40 Bronchitis, not specified as acute or chronic: Secondary | ICD-10-CM

## 2012-07-13 DIAGNOSIS — Z79899 Other long term (current) drug therapy: Secondary | ICD-10-CM | POA: Insufficient documentation

## 2012-07-13 DIAGNOSIS — R059 Cough, unspecified: Secondary | ICD-10-CM | POA: Insufficient documentation

## 2012-07-13 DIAGNOSIS — R05 Cough: Secondary | ICD-10-CM | POA: Insufficient documentation

## 2012-07-13 DIAGNOSIS — Z720 Tobacco use: Secondary | ICD-10-CM

## 2012-07-13 DIAGNOSIS — Z7982 Long term (current) use of aspirin: Secondary | ICD-10-CM | POA: Insufficient documentation

## 2012-07-13 DIAGNOSIS — Z21 Asymptomatic human immunodeficiency virus [HIV] infection status: Secondary | ICD-10-CM | POA: Insufficient documentation

## 2012-07-13 DIAGNOSIS — J45901 Unspecified asthma with (acute) exacerbation: Secondary | ICD-10-CM | POA: Insufficient documentation

## 2012-07-13 DIAGNOSIS — Z8659 Personal history of other mental and behavioral disorders: Secondary | ICD-10-CM | POA: Insufficient documentation

## 2012-07-13 DIAGNOSIS — F411 Generalized anxiety disorder: Secondary | ICD-10-CM | POA: Insufficient documentation

## 2012-07-13 DIAGNOSIS — F329 Major depressive disorder, single episode, unspecified: Secondary | ICD-10-CM | POA: Insufficient documentation

## 2012-07-13 DIAGNOSIS — F3289 Other specified depressive episodes: Secondary | ICD-10-CM | POA: Insufficient documentation

## 2012-07-13 DIAGNOSIS — J309 Allergic rhinitis, unspecified: Secondary | ICD-10-CM | POA: Insufficient documentation

## 2012-07-13 MED ORDER — AEROCHAMBER Z-STAT PLUS/MEDIUM MISC: 1.0000 | Freq: Once | Status: DC

## 2012-07-13 MED ORDER — ALBUTEROL SULFATE (5 MG/ML) 0.5% IN NEBU
5.0000 mg | INHALATION_SOLUTION | Freq: Once | RESPIRATORY_TRACT | Status: AC
  Administered 2012-07-13: 5 mg via RESPIRATORY_TRACT
  Filled 2012-07-13: qty 1

## 2012-07-13 MED ORDER — PREDNISONE 20 MG PO TABS: 20.0000 mg | ORAL_TABLET | Freq: Two times a day (BID) | ORAL | Status: DC

## 2012-07-13 MED ORDER — ALBUTEROL SULFATE HFA 108 (90 BASE) MCG/ACT IN AERS
2.0000 | INHALATION_SPRAY | RESPIRATORY_TRACT | Status: DC | PRN
  Administered 2012-07-13 (×2): 2 via RESPIRATORY_TRACT
  Filled 2012-07-13 (×2): qty 6.7

## 2012-07-13 MED ORDER — PREDNISONE 20 MG PO TABS
60.0000 mg | ORAL_TABLET | Freq: Once | ORAL | Status: AC
  Administered 2012-07-13: 60 mg via ORAL
  Filled 2012-07-13: qty 3

## 2012-07-13 NOTE — ED Notes (Signed)
Ran out of inhaler last night. Has been SOB since 0200 this am.

## 2012-07-13 NOTE — Progress Notes (Signed)
Patient ID: John [name], male   DOB: [birth year], 42 y.o.   MRN: [id number] [name] made first session with Clinical research associate. Patient presented oriented x 4, mildly anxious evidenced by fidgeting with foot. Eye contact and verbalizations were adequate. He began the session by stating "I might as well just go on and tell you, I'm alcoholic". [name] reports drinking every day, usually 12+ beers through the day or occasionally liquor in the a.m. But then drinking typically more heavily in the evening hours (liquor only). States he's been to several rehabs but then returns to drinking. Has no sobriety support. Lives with an aunt who also drinks. In reviewing a variety of treatment options, [name] decided that he wanted to pursue [facility] Intensive Outpatient Program that meets Mon-Wed-Fri from 9:00 am until noon. He did not want to re-enter detox or inpatient although he agreed that option could be considered at later date if IOP proves insufficient to help him reach his goal of "tapering down off of alcohol'.   [name] reports being diagnosed with depression at [facility] and receiving Trazodone and another anti-depressant (could not remember med name). He has a follow-up appt with [facility] scheduled for next week to see their psychiatrist for ongoing psyc care. He has an intake appt scheduled with [facility] on May 7th at 9:00 for IOP admission. [name] expressed that he is stable enough presently to engage in IOP treatment. Denies any thoughts of self-harm. Presents as wanting help and willing to participate in treatment services. Writer will follow-up with patient after [facility] IOP appt.

## 2012-07-13 NOTE — ED Provider Notes (Signed)
History     CSN: [id number]  Arrival date & time 07/13/12  6295   First MD Initiated Contact with Patient 07/13/12 1032      Chief Complaint  Patient presents with  . Asthma    (Consider location/radiation/quality/duration/timing/severity/associated sxs/prior treatment) HPI Comments: John [name] is a 42 y.o. Male presents for evaluation of sneezing, cough, shortness of breath for several days. His inhaler ran out last night. He denies fever, chills, nausea, vomiting, weakness, or dizziness. He has seasonal allergies. He continues to smoke cigarettes. No other known modifying factors  Patient is a 42 y.o. male presenting with asthma. The history is provided by the patient.  Asthma    Past Medical History  Diagnosis Date  . Asthma   . HIV (human immunodeficiency virus infection)   . Mental disorder   . Anxiety   . Depression   . Shortness of breath     Past Surgical History  Procedure Laterality Date  . Skin graft full thickness leg      POSTERIOR LEFT LEG  AFTER DOG BITES    No family history on file.  History  Substance Use Topics  . Smoking status: Current Every Day Smoker -- 0.50 packs/day for 25 years    Types: Cigarettes  . Smokeless tobacco: Never Used  . Alcohol Use: 0.0 oz/week     Comment: drinking 1.5 fifths of vodka, beer 12-pack /day      Review of Systems  All other systems reviewed and are negative.    Allergies  Shellfish allergy  Home Medications   Current Outpatient Rx  Name  Route  Sig  Dispense  Refill  . albuterol (PROVENTIL HFA;VENTOLIN HFA) 108 (90 BASE) MCG/ACT inhaler   Inhalation   Inhale 2 puffs into the lungs every 6 (six) hours as needed for shortness of breath.         . citalopram (CELEXA) 20 MG tablet   Oral   Take 1 tablet (20 mg total) by mouth daily. For depression   30 tablet   0   . risperiDONE (RISPERDAL) 0.5 MG tablet   Oral   Take 3 tablets (1.5 mg total) by mouth at bedtime. For mood control   45  tablet   0   . traZODone (DESYREL) 100 MG tablet   Oral   Take 1 tablet (100 mg total) by mouth at bedtime and may repeat dose one time if needed. For depression/sleep   60 tablet   0   . Fluticasone-Salmeterol (ADVAIR) 100-50 MCG/DOSE AEPB   Inhalation   Inhale 1 puff into the lungs every morning. For Asthma   60 each      . predniSONE (DELTASONE) 20 MG tablet   Oral   Take 1 tablet (20 mg total) by mouth 2 (two) times daily.   10 tablet   0     BP 150/78  Pulse 98  Temp(Src) 98 F (36.7 C) (Oral)  Resp 18  SpO2 100%  Physical Exam  Nursing note and vitals reviewed. Constitutional: He is oriented to person, place, and time. He appears well-developed and well-nourished.  HENT:  Head: Normocephalic and atraumatic.  Right Ear: External ear normal.  Left Ear: External ear normal.  Eyes: Conjunctivae and EOM are normal. Pupils are equal, round, and reactive to light.  Neck: Normal range of motion and phonation normal. Neck supple.  Cardiovascular: Normal rate, regular rhythm, normal heart sounds and intact distal pulses.   Pulmonary/Chest: Effort normal. He exhibits no bony  tenderness.  Scattered wheezing and rhonchi, with decreased air movement, bilaterally  Abdominal: Soft. Normal appearance. There is no tenderness.  Musculoskeletal: Normal range of motion.  Neurological: He is alert and oriented to person, place, and time. He has normal strength. No cranial nerve deficit or sensory deficit. He exhibits normal muscle tone. Coordination normal.  Skin: Skin is warm, dry and intact.  Psychiatric: He has a normal mood and affect. His behavior is normal. Judgment and thought content normal.    ED Course  Procedures (including critical care time)      1. Bronchitis   2. Allergic rhinitis   3. Tobacco abuse       MDM  Evaluation is consistent with seasonal allergies with secondary bronchitis. This is aggravated by smoking.  Doubt metabolic instability, serious  bacterial infection or impending vascular collapse; the patient is stable for discharge.  Nursing Notes Reviewed/ Care Coordinated, and agree without changes. Applicable Imaging Reviewed.  Interpretation of Laboratory Data incorporated into ED treatment     Plan: Home Medications- prednisone, OTC antihistamine; Home Treatments- stop smoking; Recommended follow up- PCP, when necessary          Flint Melter, MD 07/13/12 1712

## 2012-07-19 ENCOUNTER — Emergency Department (HOSPITAL_COMMUNITY)
Admission: EM | Admit: 2012-07-19 | Discharge: 2012-07-20 | Disposition: A | Discharge status: Rehabilitation Facility | Payer: No Typology Code available for payment source | Attending: Emergency Medicine | Admitting: Emergency Medicine

## 2012-07-19 ENCOUNTER — Encounter (HOSPITAL_COMMUNITY): Payer: Self-pay | Admitting: Emergency Medicine

## 2012-07-19 DIAGNOSIS — Z79899 Other long term (current) drug therapy: Secondary | ICD-10-CM | POA: Insufficient documentation

## 2012-07-19 DIAGNOSIS — J45909 Unspecified asthma, uncomplicated: Secondary | ICD-10-CM | POA: Insufficient documentation

## 2012-07-19 DIAGNOSIS — F1010 Alcohol abuse, uncomplicated: Secondary | ICD-10-CM | POA: Insufficient documentation

## 2012-07-19 DIAGNOSIS — R45851 Suicidal ideations: Secondary | ICD-10-CM | POA: Insufficient documentation

## 2012-07-19 DIAGNOSIS — F1910 Other psychoactive substance abuse, uncomplicated: Secondary | ICD-10-CM | POA: Insufficient documentation

## 2012-07-19 DIAGNOSIS — Z8679 Personal history of other diseases of the circulatory system: Secondary | ICD-10-CM | POA: Insufficient documentation

## 2012-07-19 DIAGNOSIS — Z21 Asymptomatic human immunodeficiency virus [HIV] infection status: Secondary | ICD-10-CM | POA: Insufficient documentation

## 2012-07-19 DIAGNOSIS — F17200 Nicotine dependence, unspecified, uncomplicated: Secondary | ICD-10-CM | POA: Insufficient documentation

## 2012-07-19 DIAGNOSIS — F411 Generalized anxiety disorder: Secondary | ICD-10-CM | POA: Insufficient documentation

## 2012-07-19 DIAGNOSIS — F3289 Other specified depressive episodes: Secondary | ICD-10-CM | POA: Insufficient documentation

## 2012-07-19 DIAGNOSIS — F329 Major depressive disorder, single episode, unspecified: Secondary | ICD-10-CM | POA: Insufficient documentation

## 2012-07-19 NOTE — ED Notes (Signed)
Pt requesting from [facility], pt also states to EMS SI and HI, recent loss of daughter.

## 2012-07-20 ENCOUNTER — Encounter (HOSPITAL_COMMUNITY): Payer: Self-pay | Admitting: *Deleted

## 2012-07-20 LAB — CBC WITH DIFFERENTIAL/PLATELET
Eosinophils Absolute: 0.2 10*3/uL (ref 0.0–0.7)
Hemoglobin: 14.2 g/dL (ref 13.0–17.0)
Lymphocytes Relative: 58 % — ABNORMAL HIGH (ref 12–46)
Lymphs Abs: 3.1 10*3/uL (ref 0.7–4.0)
MCH: 32.9 pg (ref 26.0–34.0)
MCV: 92.6 fL (ref 78.0–100.0)
Monocytes Relative: 10 % (ref 3–12)
Neutrophils Relative %: 28 % — ABNORMAL LOW (ref 43–77)
RBC: 4.31 MIL/uL (ref 4.22–5.81)
WBC: 5.3 10*3/uL (ref 4.0–10.5)

## 2012-07-20 LAB — COMPREHENSIVE METABOLIC PANEL
Alkaline Phosphatase: 100 U/L (ref 39–117)
BUN: 6 mg/dL (ref 6–23)
CO2: 21 mEq/L (ref 19–32)
GFR calc Af Amer: >90 mL/min (ref >90)
GFR calc non Af Amer: >90 mL/min (ref >90)
Glucose, Bld: 131 mg/dL — ABNORMAL HIGH (ref 70–99)
Potassium: 3.5 mEq/L (ref 3.5–5.1)
Total Bilirubin: 0.5 mg/dL (ref 0.3–1.2)
Total Protein: 7.2 g/dL (ref 6.0–8.3)

## 2012-07-20 LAB — ETHANOL
Alcohol, Ethyl (B): 211 mg/dL — ABNORMAL HIGH (ref 0–11)
Alcohol, Ethyl (B): <11 mg/dL (ref 0–11)

## 2012-07-20 LAB — RAPID URINE DRUG SCREEN, HOSP PERFORMED: Barbiturates: NOT DETECTED

## 2012-07-20 MED ORDER — LORAZEPAM 2 MG/ML IJ SOLN: 1.0000 mg | Freq: Four times a day (QID) | INTRAMUSCULAR | Status: DC | PRN

## 2012-07-20 MED ORDER — ACETAMINOPHEN 325 MG PO TABS
650.0000 mg | ORAL_TABLET | ORAL | Status: DC | PRN
  Administered 2012-07-20: 650 mg via ORAL
  Filled 2012-07-20: qty 2

## 2012-07-20 MED ORDER — LORAZEPAM 1 MG PO TABS
0.0000 mg | ORAL_TABLET | Freq: Two times a day (BID) | ORAL | Status: DC
  Administered 2012-07-20 (×2): 1 mg via ORAL

## 2012-07-20 MED ORDER — TRAZODONE HCL 100 MG PO TABS
100.0000 mg | ORAL_TABLET | Freq: Every evening | ORAL | Status: DC | PRN
Start: 2012-07-20 — End: 2012-08-23

## 2012-07-20 MED ORDER — PREDNISONE 20 MG PO TABS: 20.0000 mg | ORAL_TABLET | Freq: Two times a day (BID) | ORAL | Status: DC

## 2012-07-20 MED ORDER — LORAZEPAM 1 MG PO TABS
0.0000 mg | ORAL_TABLET | Freq: Four times a day (QID) | ORAL | Status: DC
  Administered 2012-07-20: 2 mg via ORAL
  Administered 2012-07-20: 1 mg via ORAL
  Administered 2012-07-20 (×2): 2 mg via ORAL
  Filled 2012-07-20 (×3): qty 2

## 2012-07-20 MED ORDER — FOLIC ACID 1 MG PO TABS
1.0000 mg | ORAL_TABLET | Freq: Every day | ORAL | Status: DC
  Administered 2012-07-20: 1 mg via ORAL
  Filled 2012-07-20: qty 1

## 2012-07-20 MED ORDER — RISPERIDONE 0.5 MG PO TABS: 1.5000 mg | ORAL_TABLET | Freq: Every day | ORAL | Status: DC

## 2012-07-20 MED ORDER — FLUTICASONE-SALMETEROL 100-50 MCG/DOSE IN AEPB: 1.0000 | INHALATION_SPRAY | Freq: Every morning | RESPIRATORY_TRACT | Status: DC

## 2012-07-20 MED ORDER — ALBUTEROL SULFATE HFA 108 (90 BASE) MCG/ACT IN AERS: 2.0000 | INHALATION_SPRAY | Freq: Four times a day (QID) | RESPIRATORY_TRACT | Status: DC | PRN

## 2012-07-20 MED ORDER — ZOLPIDEM TARTRATE 5 MG PO TABS: 5.0000 mg | ORAL_TABLET | Freq: Every evening | ORAL | Status: DC | PRN

## 2012-07-20 MED ORDER — VITAMIN B-1 100 MG PO TABS
100.0000 mg | ORAL_TABLET | Freq: Every day | ORAL | Status: DC
  Administered 2012-07-20: 100 mg via ORAL
  Filled 2012-07-20: qty 1

## 2012-07-20 MED ORDER — ONDANSETRON HCL 4 MG PO TABS: 4.0000 mg | ORAL_TABLET | Freq: Three times a day (TID) | ORAL | Status: DC | PRN

## 2012-07-20 MED ORDER — THIAMINE HCL 100 MG/ML IJ SOLN: 100.0000 mg | Freq: Every day | INTRAMUSCULAR | Status: DC

## 2012-07-20 MED ORDER — PREDNISONE 20 MG PO TABS
20.0000 mg | ORAL_TABLET | Freq: Two times a day (BID) | ORAL | Status: DC
  Filled 2012-07-20: qty 10

## 2012-07-20 MED ORDER — RISPERIDONE 1 MG PO TABS
1.5000 mg | ORAL_TABLET | Freq: Every day | ORAL | Status: DC
  Administered 2012-07-20: 1.5 mg via ORAL
  Filled 2012-07-20: qty 21

## 2012-07-20 MED ORDER — FLUTICASONE-SALMETEROL 100-50 MCG/DOSE IN AEPB
1.0000 | INHALATION_SPRAY | Freq: Every day | RESPIRATORY_TRACT | Status: DC
  Administered 2012-07-20: 1 via RESPIRATORY_TRACT
  Filled 2012-07-20: qty 14

## 2012-07-20 MED ORDER — CITALOPRAM HYDROBROMIDE 20 MG PO TABS
20.0000 mg | ORAL_TABLET | Freq: Every day | ORAL | Status: DC
Start: 2012-07-20 — End: 2012-07-21
  Administered 2012-07-20: 20 mg via ORAL
  Filled 2012-07-20: qty 14

## 2012-07-20 MED ORDER — TRAZODONE HCL 100 MG PO TABS
100.0000 mg | ORAL_TABLET | Freq: Every day | ORAL | Status: DC
  Administered 2012-07-20: 100 mg via ORAL
  Filled 2012-07-20: qty 28

## 2012-07-20 MED ORDER — CITALOPRAM HYDROBROMIDE 20 MG PO TABS: 20.0000 mg | ORAL_TABLET | Freq: Every day | ORAL | Status: DC

## 2012-07-20 MED ORDER — ALBUTEROL SULFATE HFA 108 (90 BASE) MCG/ACT IN AERS
2.0000 | INHALATION_SPRAY | Freq: Four times a day (QID) | RESPIRATORY_TRACT | Status: DC | PRN
  Filled 2012-07-20: qty 6.7

## 2012-07-20 MED ORDER — LORAZEPAM 1 MG PO TABS
1.0000 mg | ORAL_TABLET | Freq: Four times a day (QID) | ORAL | Status: DC | PRN
  Filled 2012-07-20: qty 1

## 2012-07-20 MED ORDER — NICOTINE 21 MG/24HR TD PT24
21.0000 mg | MEDICATED_PATCH | Freq: Every day | TRANSDERMAL | Status: DC
  Administered 2012-07-20: 21 mg via TRANSDERMAL
  Filled 2012-07-20: qty 1

## 2012-07-20 MED ORDER — IBUPROFEN 200 MG PO TABS: 600.0000 mg | ORAL_TABLET | Freq: Three times a day (TID) | ORAL | Status: DC | PRN

## 2012-07-20 MED ORDER — ADULT MULTIVITAMIN W/MINERALS CH
1.0000 | ORAL_TABLET | Freq: Every day | ORAL | Status: DC
  Administered 2012-07-20: 1 via ORAL
  Filled 2012-07-20: qty 1

## 2012-07-20 NOTE — Progress Notes (Signed)
Pt accepted to [facility] by dr. Betti Cruz. Patient transportation to be provided by [facility] at 730pm. Patient receiving 2 week supply of medication by pharmacy.   Catha Gosselin, LCSWA  [phone] .07/20/2012 1640pm

## 2012-07-20 NOTE — ED Notes (Signed)
[facility] called and responded there is a 40 min delay. Pt stable resting in bed.

## 2012-07-20 NOTE — ED Provider Notes (Signed)
  Physical Exam  BP 106/61  Pulse 83  Temp(Src) 98.7 F (37.1 C) (Oral)  Resp 16  SpO2 98%  Physical Exam  ED Course  Procedures  MDM Pt accepted at [facility] - d/c at this time.      Vida Roller, MD 07/20/12 [phone]

## 2012-07-20 NOTE — BH Assessment (Signed)
Assessment Note   John [name] is an 42 y.o. male.  who presents in the ED with requesting alcohol detox and on admission to the ED stated he was SI. Patient BAL 211 on admission to the ED. Patient UDS positive for thc. CSW met with pt to complete [facility] assessment. Patient denies any current SI/HI/AH/VH. Patient stated that after drinking so much he felt SI but states he no longer has those thoughts. Patient states he lives with his aunt, and they had an argument so he continued to drink. Pt reports drinking 1/5 of vodka daily, and drank 1/2 gal of Vodka last night. Patient states he has not been compliant with medications but did take his medications last night which were his last ones.   Patient reports that he has a history of depression and anxiety, and is out of medication at this time. Patient states he has follow up appointments at [facility] and [facility] for Intensive Outpatient treatment. Patient requesting detox from alcohol due to his relapse.   Axis I: Alcohol abuse Axis II: Deferred Axis III:  Past Medical History  Diagnosis Date  . Asthma   . HIV (human immunodeficiency virus infection)   . Mental disorder   . Anxiety   . Depression   . Shortness of breath    Axis IV: economic problems, other psychosocial or environmental problems, problems related to social environment and problems with primary support group Axis V: 31-40 impairment in reality testing  Past Medical History:  Past Medical History  Diagnosis Date  . Asthma   . HIV (human immunodeficiency virus infection)   . Mental disorder   . Anxiety   . Depression   . Shortness of breath     Past Surgical History  Procedure Laterality Date  . Skin graft full thickness leg      POSTERIOR LEFT LEG  AFTER DOG BITES    Family History: No family history on file.  Social History:  reports that he has been smoking Cigarettes.  He has a 12.5 pack-year smoking history. He has never used smokeless tobacco. He reports that   drinks alcohol. He reports that he does not use illicit drugs.  Additional Social History:  Substance #1 Name of Substance 1: Alcohol  1 - Age of First Use: 17 1 - Amount (size/oz): 1/5 vodka 1 - Frequency: daily  1 - Duration: 2 months 1 - Last Use / Amount: last night 1/2 gal of vodka  CIWA: CIWA-Ar BP: 122/76 mmHg Pulse Rate: 106 Nausea and Vomiting: mild nausea with no vomiting Tactile Disturbances: none Tremor: three Auditory Disturbances: not present Paroxysmal Sweats: barely perceptible sweating, palms moist Visual Disturbances: not present Anxiety: no anxiety, at ease Headache, Fullness in Head: none present Agitation: normal activity Orientation and Clouding of Sensorium: oriented and can do serial additions CIWA-Ar Total: 5 COWS:    Allergies:  Allergies  Allergen Reactions  . Shellfish Allergy Anaphylaxis and Swelling    Home Medications:  (Not in a hospital admission)  OB/GYN Status:  No LMP for male patient.  General Assessment Data Location of Assessment: [facility] ED Living Arrangements: Other relatives Can pt return to current living arrangement?: Yes Admission Status: Voluntary Is patient capable of signing voluntary admission?: Yes Transfer from: Home Referral Source: Self/Family/Friend  Education Status Is patient currently in school?: No Highest grade of school patient has completed: 9th  Risk to self Suicidal Ideation: No-Not Currently/Within Last 6 Months Suicidal Intent: No Is patient at risk for suicide?: No Suicidal  Plan?: No Access to Means: No What has been your use of drugs/alcohol within the last 12 months?: alcohol abuse  Previous Attempts/Gestures: Yes How many times?: 1 Other Self Harm Risks: none Triggers for Past Attempts: Unknown Intentional Self Injurious Behavior: None Comment - Self Injurious Behavior: none Family Suicide History: No Recent stressful life event(s): Conflict (Comment) (with aunt) Persecutory  voices/beliefs?: No Depression: Yes Depression Symptoms: Feeling angry/irritable Substance abuse history and/or treatment for substance abuse?: Yes Suicide prevention information given to non-admitted patients: Not applicable  Risk to Others Homicidal Ideation: No Thoughts of Harm to Others: No Current Homicidal Intent: No Current Homicidal Plan: No Access to Homicidal Means: No Identified Victim: none History of harm to others?: No Assessment of Violence: None Noted Violent Behavior Description: none Does patient have access to weapons?: No Criminal Charges Pending?: No Does patient have a court date: No  Psychosis Hallucinations: None noted Delusions: None noted  Mental Status Report Appear/Hygiene: Disheveled Eye Contact: Poor Motor Activity: Tremors Speech: Logical/coherent Level of Consciousness: Alert Mood: Irritable Affect: Appropriate to circumstance Anxiety Level: None Thought Processes: Coherent Judgement: Unimpaired Orientation: Person;Place;Time;Situation Obsessive Compulsive Thoughts/Behaviors: None  Cognitive Functioning Concentration: Decreased Memory: Recent Intact;Remote Intact IQ: Average Insight: Fair Impulse Control: Poor Appetite: Fair Sleep: No Change Total Hours of Sleep: 7  ADLScreening [facility]) Patient's cognitive ability adequate to safely complete daily activities?: Yes Patient able to express need for assistance with ADLs?: Yes Independently performs ADLs?: Yes (appropriate for developmental age)  Abuse/Neglect [facility]) Physical Abuse: Denies Verbal Abuse: Denies Sexual Abuse: Denies  Prior Inpatient Therapy Prior Inpatient Therapy: Yes Prior Therapy Dates: 05/2012  Prior Therapy Facilty/Provider(s): [facility] Reason for Treatment: Detox   Prior Outpatient Therapy Prior Outpatient Therapy: No  ADL Screening (condition at time of admission) Patient's cognitive ability adequate to safely complete daily activities?:  Yes Patient able to express need for assistance with ADLs?: Yes Independently performs ADLs?: Yes (appropriate for developmental age)  Home Assistive Devices/Equipment Home Assistive Devices/Equipment: None    Abuse/Neglect Assessment (Assessment to be complete while patient is alone) Physical Abuse: Denies Verbal Abuse: Denies Sexual Abuse: Denies Values / Beliefs Cultural Requests During Hospitalization: None Spiritual Requests During Hospitalization: None        Additional Information 1:1 In Past 12 Months?: No CIRT Risk: No Elopement Risk: No Does patient have medical clearance?: Yes     Disposition:  Disposition Initial Assessment Completed for this Encounter: Yes Disposition of Patient: Referred to Type of inpatient treatment program: Adult Patient referred to: [facility]  On Site Evaluation by:   Reviewed with Physician:     Catha Gosselin A 07/20/2012 12:00 PM

## 2012-07-20 NOTE — Progress Notes (Signed)
Late entry for 1630 [facility] ED CM spoke with [facility] staff members about pt self  pay status and compliance with follow up with self pay pcps. Noted in [facility] data base frequent attempts to be reached by [facility], [facility] CM, without success and noncompliance with appointments and medications.

## 2012-07-20 NOTE — ED Provider Notes (Signed)
History     CSN: [id number]  Arrival date & time 07/19/12  2342   First MD Initiated Contact with Patient 07/20/12 0021      Chief Complaint  Patient presents with  . Medical Clearance    (Consider location/radiation/quality/duration/timing/severity/associated sxs/prior treatment) HPI Savion Washam is a 42 y.o. male who presents to ED with complaint of wanting alcohol detox. States drinks every days. States has been in detox before but relapsed. Pt admits to drinking today. States having SI, does not wish to elaborate further. Pt states he has depression and anxiety. Has been off his meds for about 2 months. States also has hx of HIV, states sees a PCP for this.    Past Medical History  Diagnosis Date  . Asthma   . HIV (human immunodeficiency virus infection)   . Mental disorder   . Anxiety   . Depression   . Shortness of breath     Past Surgical History  Procedure Laterality Date  . Skin graft full thickness leg      POSTERIOR LEFT LEG  AFTER DOG BITES    No family history on file.  History  Substance Use Topics  . Smoking status: Current Every Day Smoker -- 0.50 packs/day for 25 years    Types: Cigarettes  . Smokeless tobacco: Never Used  . Alcohol Use: 0.0 oz/week     Comment: drinking 1.5 fifths of vodka, beer 12-pack /day      Review of Systems  Constitutional: Negative for fever and chills.  HENT: Negative for neck pain and neck stiffness.   Respiratory: Negative.   Cardiovascular: Negative.   Skin: Negative.   Neurological: Negative for headaches.  Psychiatric/Behavioral: Positive for suicidal ideas, behavioral problems and confusion. The patient is nervous/anxious.   All other systems reviewed and are negative.    Allergies  Shellfish allergy  Home Medications   Current Outpatient Rx  Name  Route  Sig  Dispense  Refill  . albuterol (PROVENTIL HFA;VENTOLIN HFA) 108 (90 BASE) MCG/ACT inhaler   Inhalation   Inhale 2 puffs into the lungs every  6 (six) hours as needed for shortness of breath.         . citalopram (CELEXA) 20 MG tablet   Oral   Take 1 tablet (20 mg total) by mouth daily. For depression   30 tablet   0   . Fluticasone-Salmeterol (ADVAIR) 100-50 MCG/DOSE AEPB   Inhalation   Inhale 1 puff into the lungs every morning. For Asthma   60 each      . predniSONE (DELTASONE) 20 MG tablet   Oral   Take 1 tablet (20 mg total) by mouth 2 (two) times daily.   10 tablet   0   . risperiDONE (RISPERDAL) 0.5 MG tablet   Oral   Take 3 tablets (1.5 mg total) by mouth at bedtime. For mood control   45 tablet   0   . traZODone (DESYREL) 100 MG tablet   Oral   Take 1 tablet (100 mg total) by mouth at bedtime and may repeat dose one time if needed. For depression/sleep   60 tablet   0     BP 159/92  Pulse 113  Temp(Src) 98.3 F (36.8 C) (Oral)  Resp 22  SpO2 96%  Physical Exam  Nursing note and vitals reviewed. Constitutional: He is oriented to person, place, and time. He appears well-developed and well-nourished. No distress.  HENT:  Head: Normocephalic.  Eyes: Conjunctivae are normal. Pupils are  equal, round, and reactive to light.  Neck: Neck supple.  Cardiovascular: Regular rhythm and normal heart sounds.   tachycardic  Pulmonary/Chest: Effort normal and breath sounds normal. No respiratory distress. He has no wheezes. He has no rales.  Musculoskeletal: He exhibits no edema.  Neurological: He is alert and oriented to person, place, and time. No cranial nerve deficit. Coordination normal.  tremmulous  Skin: Skin is warm and dry.  Psychiatric:  Pt appears anxious, aggressive. Yelling out of the room. Appears intoxicated.     ED Course  Procedures (including critical care time)  Results for orders placed during the hospital encounter of 07/19/12  CBC WITH DIFFERENTIAL      Result Value Range   WBC 5.3  4.0 - 10.5 K/uL   RBC 4.31  4.22 - 5.81 MIL/uL   Hemoglobin 14.2  13.0 - 17.0 g/dL   HCT 41.3   24.4 - 01.0 %   MCV 92.6  78.0 - 100.0 fL   MCH 32.9  26.0 - 34.0 pg   MCHC 35.6  30.0 - 36.0 g/dL   RDW 27.2  53.6 - 64.4 %   Platelets 277  150 - 400 K/uL   Neutrophils Relative 28 (*) 43 - 77 %   Neutro Abs 1.5 (*) 1.7 - 7.7 K/uL   Lymphocytes Relative 58 (*) 12 - 46 %   Lymphs Abs 3.1  0.7 - 4.0 K/uL   Monocytes Relative 10  3 - 12 %   Monocytes Absolute 0.5  0.1 - 1.0 K/uL   Eosinophils Relative 4  0 - 5 %   Eosinophils Absolute 0.2  0.0 - 0.7 K/uL   Basophils Relative 0  0 - 1 %   Basophils Absolute 0.0  0.0 - 0.1 K/uL  COMPREHENSIVE METABOLIC PANEL      Result Value Range   Sodium 138  135 - 145 mEq/L   Potassium 3.5  3.5 - 5.1 mEq/L   Chloride 101  96 - 112 mEq/L   CO2 21  19 - 32 mEq/L   Glucose, Bld 131 (*) 70 - 99 mg/dL   BUN 6  6 - 23 mg/dL   Creatinine, Ser 0.34  0.50 - 1.35 mg/dL   Calcium 8.9  8.4 - 74.2 mg/dL   Total Protein 7.2  6.0 - 8.3 g/dL   Albumin 3.6  3.5 - 5.2 g/dL   AST 46 (*) 0 - 37 U/L   ALT 34  0 - 53 U/L   Alkaline Phosphatase 100  39 - 117 U/L   Total Bilirubin 0.5  0.3 - 1.2 mg/dL   GFR calc non Af Amer >90  >90 mL/min   GFR calc Af Amer >90  >90 mL/min  ETHANOL      Result Value Range   Alcohol, Ethyl (B) 211 (*) 0 - 11 mg/dL  URINE RAPID DRUG SCREEN (HOSP PERFORMED)      Result Value Range   Opiates NONE DETECTED  NONE DETECTED   Cocaine NONE DETECTED  NONE DETECTED   Benzodiazepines NONE DETECTED  NONE DETECTED   Amphetamines NONE DETECTED  NONE DETECTED   Tetrahydrocannabinol POSITIVE (*) NONE DETECTED   Barbiturates NONE DETECTED  NONE DETECTED   No results found.  Pt with alcohol and polysub abuse, also admits to SI and HI. Pt is intoxicated at this time, with some tremor noted. Not sure if this is early sign of withdrawal vs pts normal tremor. WIlls tart on CIWA, holding ordered. Spoke with  [facility], will assess for placement.   Filed Vitals:   07/19/12 2346 07/20/12 0029 07/20/12 0057 07/20/12 0142  BP:  159/92 159/92 113/79   Pulse:  113 113 104  Temp:  98.3 F (36.8 C)    TempSrc:  Oral    Resp:  22  21  SpO2: 98% 96%  98%     1. Alcohol abuse   2. Polysubstance abuse   3. Suicidal ideations       MDM          Lottie Mussel, PA-C 07/21/12 [phone]

## 2012-07-20 NOTE — Progress Notes (Addendum)
Pt referred to [facility] pending review, updated BAL faxed and given verbally. CSW completed prescreen and pending return call for acceptance.  Pt referred to [facility], pending review.  Pt referred to [facility], pending review.  No beds available at [facility].   Marland KitchenCatha Gosselin, Theresia Majors  [phone] .07/20/2012 1216pm

## 2012-07-20 NOTE — Progress Notes (Signed)
[facility] ED CM consulted by ED SW for 2 week supply medication assistance for d/c to [facility]. CM spoke with Dr Hyacinth Meeker for Rx Cm processed and tubed Rx to [facility] pharmacy at 1837.  CM spoke with ED RN, Donell Beers about need to contact pharmacy so medications will be available prior to pt scheduled d/c CM signing off

## 2012-07-20 NOTE — ED Notes (Signed)
Pt states that he has been out of meds for a couple of months; pt does not know what meds he was taking; pt comes to the ED requesting Detox from ETOH; pt states that his last drink was tonight; pt w/ tremors notes to upper extremities

## 2012-07-20 NOTE — BH Assessment (Deleted)
Assessment Note   John [name] is an 42 y.o. male who presents in the ED with requesting alcohol detox and on admission to the ED stated he was SI. Patient BAL 211 on admission to the ED. Patient UDS positive for thc. CSW met with pt to complete [name] assessment. Patient denies any current SI/HI/AH/VH. Patient stated that after drinking so much he felt SI but states he no longer has those thoughts. Patient states he lives with his aunt, and they had an argument so he continued to drink. Pt reports drinking 1/5 of vodka daily, and drank 1/2 gal of Vodka last night.   Patient reports that he has a history of depression and anxiety, and is out of medication at this time. Patient states he has follow up appointments at [facility] and [facility] for Intensive Outpatient treatment. Patient requesting detox from alcohol due to his relapse.   Past Medical History:  Past Medical History  Diagnosis Date  . Asthma   . HIV (human immunodeficiency virus infection)   . Mental disorder   . Anxiety   . Depression   . Shortness of breath     Past Surgical History  Procedure Laterality Date  . Skin graft full thickness leg      POSTERIOR LEFT LEG  AFTER DOG BITES    Family History: No family history on file.  Social History:  reports that he has been smoking Cigarettes.  He has a 12.5 pack-year smoking history. He has never used smokeless tobacco. He reports that  drinks alcohol. He reports that he does not use illicit drugs.  Additional Social History:  Substance #1 Name of Substance 1: Alcohol  1 - Age of First Use: 17 1 - Amount (size/oz): 1/5 vodka 1 - Frequency: daily  1 - Duration: 2 months 1 - Last Use / Amount: last night 1/2 gal of vodka  CIWA: CIWA-Ar BP: 120/62 mmHg Pulse Rate: 106 Nausea and Vomiting: no nausea and no vomiting Tactile Disturbances: none Tremor: moderate, with patient's arms extended Auditory Disturbances: not present Paroxysmal Sweats: no sweat visible Visual  Disturbances: not present Anxiety: mildly anxious Headache, Fullness in Head: none present Agitation: normal activity Orientation and Clouding of Sensorium: oriented and can do serial additions CIWA-Ar Total: 5 COWS:    Allergies:  Allergies  Allergen Reactions  . Shellfish Allergy Anaphylaxis and Swelling    Home Medications:  (Not in a hospital admission)  OB/GYN Status:  No LMP for male patient.  General Assessment Data Location of Assessment: [facility] ED Living Arrangements: Other relatives Can pt return to current living arrangement?: Yes Admission Status: Voluntary Is patient capable of signing voluntary admission?: Yes Transfer from: Home Referral Source: Self/Family/Friend  Education Status Is patient currently in school?: No Highest grade of school patient has completed: 9th  Risk to self Suicidal Ideation: No-Not Currently/Within Last 6 Months Suicidal Intent: No Is patient at risk for suicide?: No Suicidal Plan?: No Access to Means: No What has been your use of drugs/alcohol within the last 12 months?: alcohol abuse  Previous Attempts/Gestures: Yes How many times?: 1 Other Self Harm Risks: none Triggers for Past Attempts: Unknown Intentional Self Injurious Behavior: None Comment - Self Injurious Behavior: none Family Suicide History: No Recent stressful life event(s): Conflict (Comment) (with aunt) Persecutory voices/beliefs?: No Depression: Yes Depression Symptoms: Feeling angry/irritable Substance abuse history and/or treatment for substance abuse?: Yes Suicide prevention information given to non-admitted patients: Not applicable  Risk to Others Homicidal Ideation: No Thoughts of Harm to  Others: No Current Homicidal Intent: No Current Homicidal Plan: No Access to Homicidal Means: No Identified Victim: none History of harm to others?: No Assessment of Violence: None Noted Violent Behavior Description: none Does patient have access to weapons?:  No Criminal Charges Pending?: No Does patient have a court date: No  Psychosis Hallucinations: None noted Delusions: None noted  Mental Status Report Appear/Hygiene: Disheveled Eye Contact: Poor Motor Activity: Tremors Speech: Logical/coherent Level of Consciousness: Alert Mood: Irritable Affect: Appropriate to circumstance Anxiety Level: None Thought Processes: Coherent Judgement: Unimpaired Orientation: Person;Place;Time;Situation Obsessive Compulsive Thoughts/Behaviors: None  Cognitive Functioning Concentration: Decreased Memory: Recent Intact;Remote Intact IQ: Average Insight: Fair Impulse Control: Poor Appetite: Fair Sleep: No Change Total Hours of Sleep: 7  ADLScreening [facility] Assessment Services) Patient's cognitive ability adequate to safely complete daily activities?: Yes Patient able to express need for assistance with ADLs?: Yes Independently performs ADLs?: Yes (appropriate for developmental age)  Abuse/Neglect [facility]) Physical Abuse: Denies Verbal Abuse: Denies Sexual Abuse: Denies  Prior Inpatient Therapy Prior Inpatient Therapy: Yes Prior Therapy Dates: 05/2012  Prior Therapy Facilty/Provider(s): [facility] Reason for Treatment: Detox   Prior Outpatient Therapy Prior Outpatient Therapy: No  ADL Screening (condition at time of admission) Patient's cognitive ability adequate to safely complete daily activities?: Yes Patient able to express need for assistance with ADLs?: Yes Independently performs ADLs?: Yes (appropriate for developmental age)  Home Assistive Devices/Equipment Home Assistive Devices/Equipment: None    Abuse/Neglect Assessment (Assessment to be complete while patient is alone) Physical Abuse: Denies Verbal Abuse: Denies Sexual Abuse: Denies Values / Beliefs Cultural Requests During Hospitalization: None Spiritual Requests During Hospitalization: None        Additional Information 1:1 In Past 12 Months?: No CIRT Risk:  No Elopement Risk: No Does patient have medical clearance?: Yes     Disposition:  Disposition Initial Assessment Completed for this Encounter: Yes Disposition of Patient: Referred to Type of inpatient treatment program: Adult Patient referred to: [facility]  On Site Evaluation by:   Reviewed with Physician:     Catha Gosselin A 07/20/2012 11:41 AM

## 2012-07-21 NOTE — Progress Notes (Signed)
Late entry for 07/20/12 [facility] ED visit Pt listed as [place] resident.   [facility], [facility], spoke with pt. Pt offered [facility] services to assist with finding a [place]

## 2012-07-21 NOTE — ED Provider Notes (Signed)
Medical screening examination/treatment/procedure(s) were performed by non-physician practitioner and as supervising physician I was immediately available for consultation/collaboration.  John-Adam Shahab Polhamus, M.D.   John-Adam Rei Contee, MD 07/21/12 0749 

## 2012-08-08 ENCOUNTER — Emergency Department (HOSPITAL_COMMUNITY)
Admission: EM | Admit: 2012-08-08 | Discharge: 2012-08-08 | Discharge status: Against Medical Advice | Payer: Self-pay | Attending: Emergency Medicine | Admitting: Emergency Medicine

## 2012-08-08 ENCOUNTER — Encounter (HOSPITAL_COMMUNITY): Payer: Self-pay | Admitting: *Deleted

## 2012-08-08 DIAGNOSIS — R5383 Other fatigue: Secondary | ICD-10-CM | POA: Insufficient documentation

## 2012-08-08 DIAGNOSIS — F1010 Alcohol abuse, uncomplicated: Secondary | ICD-10-CM | POA: Insufficient documentation

## 2012-08-08 DIAGNOSIS — R5381 Other malaise: Secondary | ICD-10-CM | POA: Insufficient documentation

## 2012-08-08 NOTE — ED Notes (Signed)
Pt very upset at triage and yelling.  Pt refused to have blood drawn.  Pt jumped up out of wheelchair and walked out cussing and security chasing paitient

## 2012-08-08 NOTE — ED Notes (Signed)
Pt started drinking at noon and had onefifth of liquor.  Pt states that the more he drank the worse his vision got worse.  Pt states that his eyes are burning and states vision got worse at 1030 before drinking.  Pt has pain to inner left knee and states he has been dragging his left leg for 2 days.  Pt has pain with palpation to left leg/pulse is present.  Pt has no other symptoms

## 2012-08-09 ENCOUNTER — Ambulatory Visit: Payer: Self-pay

## 2012-08-09 ENCOUNTER — Ambulatory Visit: Payer: Self-pay | Admitting: Infectious Disease

## 2012-08-22 ENCOUNTER — Ambulatory Visit (INDEPENDENT_AMBULATORY_CARE_PROVIDER_SITE_OTHER): Payer: Self-pay | Admitting: *Deleted

## 2012-08-22 VITALS — BP 125/87 | HR 58 | Temp 98.2°F | Resp 16 | Wt 178.5 lb

## 2012-08-22 DIAGNOSIS — B20 Human immunodeficiency virus [HIV] disease: Secondary | ICD-10-CM

## 2012-08-22 DIAGNOSIS — Z21 Asymptomatic human immunodeficiency virus [HIV] infection status: Secondary | ICD-10-CM

## 2012-08-22 LAB — POCT URINALYSIS DIPSTICK
Ketones, UA: 40
Leukocytes, UA: NEGATIVE
Spec Grav, UA: >=1.03

## 2012-08-22 LAB — CD4/CD8 (T-HELPER/T-SUPPRESSOR CELL)
CD4%: 26.3
CD4: 447
CD8 % Suppressor T Cell: 54.3
CD8: 923

## 2012-08-22 LAB — LIPID PANEL
Cholesterol: 157 mg/dL (ref 0–200)
HDL: 76 mg/dL (ref >39)
LDL Cholesterol: 58 mg/dL (ref 0–99)
Triglycerides: 113 mg/dL (ref <150)

## 2012-08-22 LAB — COMPREHENSIVE METABOLIC PANEL
Albumin: 4.2 g/dL (ref 3.5–5.2)
Alkaline Phosphatase: 69 U/L (ref 39–117)
BUN: 13 mg/dL (ref 6–23)
CO2: 22 mEq/L (ref 19–32)
Glucose, Bld: 85 mg/dL (ref 70–99)
Potassium: 4.4 mEq/L (ref 3.5–5.3)
Total Bilirubin: 0.8 mg/dL (ref 0.3–1.2)
Total Protein: 7.1 g/dL (ref 6.0–8.3)

## 2012-08-22 NOTE — Progress Notes (Signed)
Patient here for 24 month START study visit. He is c/o severe lower back pain, rating it an 8, has been hurting him past few days. He says it has bothered him for years and he did not do anything to make it flare up. I recommended he try either hot or cold packs, and NSAIDS for relief. He is sniffling and eyes are watering which he says is from allergies. He seems anxious and says he is still drinking about 3-4 days a week. He was reminded to schedule an appt with a physician soon.

## 2012-08-23 ENCOUNTER — Emergency Department (HOSPITAL_COMMUNITY): Payer: Self-pay

## 2012-08-23 ENCOUNTER — Encounter: Payer: Self-pay | Admitting: *Deleted

## 2012-08-23 ENCOUNTER — Emergency Department (HOSPITAL_COMMUNITY)
Admission: EM | Admit: 2012-08-23 | Discharge: 2012-08-23 | Disposition: A | Discharge status: Home / Self Care | Payer: Self-pay | Attending: Emergency Medicine | Admitting: Emergency Medicine

## 2012-08-23 ENCOUNTER — Encounter (HOSPITAL_COMMUNITY): Payer: Self-pay | Admitting: *Deleted

## 2012-08-23 DIAGNOSIS — Z21 Asymptomatic human immunodeficiency virus [HIV] infection status: Secondary | ICD-10-CM | POA: Insufficient documentation

## 2012-08-23 DIAGNOSIS — J45901 Unspecified asthma with (acute) exacerbation: Secondary | ICD-10-CM | POA: Insufficient documentation

## 2012-08-23 DIAGNOSIS — F17200 Nicotine dependence, unspecified, uncomplicated: Secondary | ICD-10-CM | POA: Insufficient documentation

## 2012-08-23 DIAGNOSIS — F3289 Other specified depressive episodes: Secondary | ICD-10-CM | POA: Insufficient documentation

## 2012-08-23 DIAGNOSIS — F411 Generalized anxiety disorder: Secondary | ICD-10-CM | POA: Insufficient documentation

## 2012-08-23 DIAGNOSIS — F329 Major depressive disorder, single episode, unspecified: Secondary | ICD-10-CM | POA: Insufficient documentation

## 2012-08-23 DIAGNOSIS — Z79899 Other long term (current) drug therapy: Secondary | ICD-10-CM | POA: Insufficient documentation

## 2012-08-23 MED ORDER — ALBUTEROL SULFATE HFA 108 (90 BASE) MCG/ACT IN AERS
2.0000 | INHALATION_SPRAY | Freq: Four times a day (QID) | RESPIRATORY_TRACT | Status: DC
  Administered 2012-08-23: 2 via RESPIRATORY_TRACT
  Filled 2012-08-23: qty 6.7

## 2012-08-23 MED ORDER — ALBUTEROL SULFATE (5 MG/ML) 0.5% IN NEBU
5.0000 mg | INHALATION_SOLUTION | Freq: Once | RESPIRATORY_TRACT | Status: AC
  Administered 2012-08-23: 5 mg via RESPIRATORY_TRACT

## 2012-08-23 MED ORDER — PREDNISONE 20 MG PO TABS: 60.0000 mg | ORAL_TABLET | Freq: Every day | ORAL | Status: AC

## 2012-08-23 MED ORDER — ALBUTEROL SULFATE (5 MG/ML) 0.5% IN NEBU
INHALATION_SOLUTION | RESPIRATORY_TRACT | Status: AC
  Filled 2012-08-23: qty 1

## 2012-08-23 MED ORDER — PREDNISONE 20 MG PO TABS
60.0000 mg | ORAL_TABLET | ORAL | Status: AC
  Administered 2012-08-23: 60 mg via ORAL
  Filled 2012-08-23: qty 3

## 2012-08-23 NOTE — ED Notes (Signed)
Discharge instructions and follow up reviewed. Pt verbalized understanding.  

## 2012-08-23 NOTE — ED Notes (Signed)
Pt is here with sob and asthma exacerbation with audible wheezing.  Started 2 days ago and is out of his medications

## 2012-08-23 NOTE — ED Notes (Signed)
Pt states he feels like the breathing treatment helped.

## 2012-08-23 NOTE — ED Notes (Signed)
Pt states he takes advair and albuterol to treat asthma, pt states he has not been home to use his advair and ran out of his albuterol inhaler about 3-4 days ago.

## 2012-08-23 NOTE — ED Provider Notes (Signed)
History     CSN: [id number]  Arrival date & time 08/23/12  1452   First MD Initiated Contact with Patient 08/23/12 1815      Chief Complaint  Patient presents with  . Asthma    (Consider location/radiation/quality/duration/timing/severity/associated sxs/prior treatment) HPI Patient presents with concerns of dyspnea and cough. Symptoms began approximately 2 days ago, as the patient ran out of his medication. Since onset he has had mild cough, chills, but no fever, no nausea, vomiting, diarrhea, belly pain, chest pain, other notable complaints. He states that he was compliant with his medication, until he ran out of his albuterol. He has a history of HIV, in addition to his asthma. He does not take any antivirals yet, as his counts seem good.  Past Medical History  Diagnosis Date  . Asthma   . HIV (human immunodeficiency virus infection)   . Mental disorder   . Anxiety   . Depression   . Shortness of breath     Past Surgical History  Procedure Laterality Date  . Skin graft full thickness leg      POSTERIOR LEFT LEG  AFTER DOG BITES    No family history on file.  History  Substance Use Topics  . Smoking status: Current Every Day Smoker -- 0.50 packs/day for 25 years    Types: Cigarettes  . Smokeless tobacco: Never Used  . Alcohol Use: 0.0 oz/week     Comment: drinking 1.5 fifths of vodka, beer 12-pack /day      Review of Systems  All other systems reviewed and are negative.    Allergies  Shellfish allergy  Home Medications   Current Outpatient Rx  Name  Route  Sig  Dispense  Refill  . albuterol (PROVENTIL HFA;VENTOLIN HFA) 108 (90 BASE) MCG/ACT inhaler   Inhalation   Inhale 2 puffs into the lungs every 6 (six) hours as needed for wheezing or shortness of breath.         . citalopram (CELEXA) 20 MG tablet   Oral   Take 1 tablet (20 mg total) by mouth daily. For depression   30 tablet   0   . Fluticasone-Salmeterol (ADVAIR) 100-50 MCG/DOSE AEPB   Inhalation   Inhale 1 puff into the lungs 2 (two) times daily.         . predniSONE (DELTASONE) 20 MG tablet   Oral   Take 1 tablet (20 mg total) by mouth 2 (two) times daily.   10 tablet   0   . risperiDONE (RISPERDAL) 0.5 MG tablet   Oral   Take 3 tablets (1.5 mg total) by mouth at bedtime. For mood control   45 tablet   0   . traZODone (DESYREL) 100 MG tablet   Oral   Take 100 mg by mouth at bedtime.           BP 148/89  Pulse 80  Temp(Src) 99 F (37.2 C) (Oral)  Resp 16  SpO2 97%  Physical Exam  Nursing note and vitals reviewed. Constitutional: He is oriented to person, place, and time. He appears well-developed. No distress.  HENT:  Head: Normocephalic and atraumatic.  Eyes: Conjunctivae and EOM are normal.  Cardiovascular: Regular rhythm.  Tachycardia present.   Pulmonary/Chest: Effort normal. No stridor. No respiratory distress.  Patient has already received one nebulizer treatment, and lung sounds have minimal wheezing.  Abdominal: He exhibits no distension.  Musculoskeletal: He exhibits no edema.  Neurological: He is alert and oriented to person, place, and  time.  Skin: Skin is warm and dry.  Psychiatric: He has a normal mood and affect.    ED Course  Procedures (including critical care time)  Labs Reviewed - No data to display No results found.   No diagnosis found.  pulse ox 99% room air normal    MDM  Patient presents with concern of ongoing dyspnea.  Patient is in no distress, and aside from mild tachycardia, is hemodynamic stable.  Tachycardia likely to 2 albuterol. With the patient's comorbidities, x-ray was performed.  This was negative.  He was discharged in stable condition after a albuterol inhaler was provided to him.        Gerhard Munch, MD 08/23/12 [phone]

## 2012-08-30 ENCOUNTER — Emergency Department (HOSPITAL_COMMUNITY)
Admission: EM | Admit: 2012-08-30 | Discharge: 2012-08-30 | Disposition: A | Discharge status: Home / Self Care | Payer: Self-pay | Attending: Emergency Medicine | Admitting: Emergency Medicine

## 2012-08-30 ENCOUNTER — Encounter (HOSPITAL_COMMUNITY): Payer: Self-pay | Admitting: Emergency Medicine

## 2012-08-30 DIAGNOSIS — Z79899 Other long term (current) drug therapy: Secondary | ICD-10-CM | POA: Insufficient documentation

## 2012-08-30 DIAGNOSIS — F3289 Other specified depressive episodes: Secondary | ICD-10-CM | POA: Insufficient documentation

## 2012-08-30 DIAGNOSIS — J45901 Unspecified asthma with (acute) exacerbation: Secondary | ICD-10-CM | POA: Insufficient documentation

## 2012-08-30 DIAGNOSIS — F17200 Nicotine dependence, unspecified, uncomplicated: Secondary | ICD-10-CM | POA: Insufficient documentation

## 2012-08-30 DIAGNOSIS — F329 Major depressive disorder, single episode, unspecified: Secondary | ICD-10-CM | POA: Insufficient documentation

## 2012-08-30 DIAGNOSIS — F411 Generalized anxiety disorder: Secondary | ICD-10-CM | POA: Insufficient documentation

## 2012-08-30 DIAGNOSIS — F489 Nonpsychotic mental disorder, unspecified: Secondary | ICD-10-CM | POA: Insufficient documentation

## 2012-08-30 DIAGNOSIS — Z21 Asymptomatic human immunodeficiency virus [HIV] infection status: Secondary | ICD-10-CM | POA: Insufficient documentation

## 2012-08-30 MED ORDER — ALBUTEROL SULFATE HFA 108 (90 BASE) MCG/ACT IN AERS
1.0000 | INHALATION_SPRAY | RESPIRATORY_TRACT | Status: DC | PRN
  Administered 2012-08-30: 1 via RESPIRATORY_TRACT
  Filled 2012-08-30: qty 6.7

## 2012-08-30 MED ORDER — AEROCHAMBER PLUS W/MASK MISC
1.0000 | Freq: Once | Status: DC
  Filled 2012-08-30: qty 1

## 2012-08-30 MED ORDER — ALBUTEROL SULFATE HFA 108 (90 BASE) MCG/ACT IN AERS: 1.0000 | INHALATION_SPRAY | RESPIRATORY_TRACT | Status: DC | PRN

## 2012-08-30 NOTE — ED Notes (Signed)
Pt dc to home. Pt sts understanding to dc instructions. Pt ambulatory to exit without difficulty. Pt denied need for w/c.

## 2012-08-30 NOTE — Discharge Instructions (Signed)
Asthma Attack Prevention HOW CAN ASTHMA BE PREVENTED? Currently, there is no way to prevent asthma from starting. However, you can take steps to control the disease and prevent its symptoms after you have been diagnosed. Learn about your asthma and how to control it. Take an active role to control your asthma by working with your caregiver to create and follow an asthma action plan. An asthma action plan guides you in taking your medicines properly, avoiding factors that make your asthma worse, tracking your level of asthma control, responding to worsening asthma, and seeking emergency care when needed. To track your asthma, keep records of your symptoms, check your peak flow number using a peak flow meter (handheld device that shows how well air moves out of your lungs), and get regular asthma checkups.  Other ways to prevent asthma attacks include:  Use medicines as your caregiver directs.  Identify and avoid things that make your asthma worse (as much as you can).  Keep track of your asthma symptoms and level of control.  Get regular checkups for your asthma.  With your caregiver, write a detailed plan for taking medicines and managing an asthma attack. Then be sure to follow your action plan. Asthma is an ongoing condition that needs regular monitoring and treatment.  Identify and avoid asthma triggers. A number of outdoor allergens and irritants (pollen, mold, cold air, air pollution) can trigger asthma attacks. Find out what causes or makes your asthma worse, and take steps to avoid those triggers.  Monitor your breathing. Learn to recognize warning signs of an attack, such as slight coughing, wheezing or shortness of breath. However, your lung function may already decrease before you notice any signs or symptoms, so regularly measure and record your peak airflow with a home peak flow meter.  Identify and treat attacks early. If you act quickly, you're less likely to have a severe attack.  You will also need less medicine to control your symptoms. When your peak flow measurements decrease and alert you to an upcoming attack, take your medicine as instructed, and immediately stop any activity that may have triggered the attack. If your symptoms do not improve, get medical help.  Pay attention to increasing quick-relief inhaler use. If you find yourself relying on your quick-relief inhaler (such as albuterol), your asthma is not under control. See your caregiver about adjusting your treatment. IDENTIFY AND CONTROL FACTORS THAT MAKE YOUR ASTHMA WORSE A number of common things can set off or make your asthma symptoms worse (asthma triggers). Keep track of your asthma symptoms for several weeks, detailing all the environmental and emotional factors that are linked with your asthma. When you have an asthma attack, go back to your asthma diary to see which factor, or combination of factors, might have contributed to it. Once you know what these factors are, you can take steps to control many of them.  Allergies: If you have allergies and asthma, it is important to take asthma prevention steps at home. Asthma attacks (worsening of asthma symptoms) can be triggered by allergies, which can cause temporary increased inflammation of your airways. Minimizing contact with the substance to which you are allergic will help prevent an asthma attack. Animal Dander:   Some people are allergic to the flakes of skin or dried saliva from animals with fur or feathers. Keep these pets out of your home.  If you can't keep a pet outdoors, keep the pet out of your bedroom and other sleeping areas at all times,  and keep the door closed.  Remove carpets and furniture covered with cloth from your home. If that is not possible, keep the pet away from fabric-covered furniture and carpets. Dust Mites:  Many people with asthma are allergic to dust mites. Dust mites are tiny bugs that are found in every home, in  mattresses, pillows, carpets, fabric-covered furniture, bedcovers, clothes, stuffed toys, fabric, and other fabric-covered items.  Cover your mattress in a special dust-proof cover.  Cover your pillow in a special dust-proof cover, or wash the pillow each week in hot water. Water must be hotter than 130 F to kill dust mites. Cold or warm water used with detergent and bleach can also be effective.  Wash the sheets and blankets on your bed each week in hot water.  Try not to sleep or lie on cloth-covered cushions.  Call ahead when traveling and ask for a smoke-free hotel room. Bring your own bedding and pillows, in case the hotel only supplies feather pillows and down comforters, which may contain dust mites and cause asthma symptoms.  Remove carpets from your bedroom and those laid on concrete, if you can.  Keep stuffed toys out of the bed, or wash the toys weekly in hot water or cooler water with detergent and bleach. Cockroaches:  Many people with asthma are allergic to the droppings and remains of cockroaches.  Keep food and garbage in closed containers. Never leave food out.  Use poison baits, traps, powders, gels, or paste (for example, boric acid).  If a spray is used to kill cockroaches, stay out of the room until the odor goes away. Indoor Mold:  Fix leaky faucets, pipes, or other sources of water that have mold around them.  Clean floors and moldy surfaces with a fungicide or diluted bleach.  Avoid using humidifiers, vaporizers, or swamp coolers. These can spread molds through the air. Pollen and Outdoor Mold:  When pollen or mold spore counts are high, try to keep your windows closed.  Stay indoors with windows closed from late morning to afternoon, if you can. Pollen and some mold spore counts are highest at that time.  Ask your caregiver whether you need to take or increase anti-inflammatory medicine before your allergy season starts. Irritants:   Tobacco smoke is  an irritant. If you smoke, ask your caregiver how you can quit. Ask family members to quit smoking, too. Do not allow smoking in your home or car.  If possible, do not use a wood-burning stove, kerosene heater, or fireplace. Minimize exposure to all sources of smoke, including incense, candles, fires, and fireworks.  Try to stay away from strong odors and sprays, such as perfume, talcum powder, hair spray, and paints.  Decrease humidity in your home and use an indoor air cleaning device. Reduce indoor humidity to below 60 percent. Dehumidifiers or central air conditioners can do this.  Decrease house dust exposure by changing furnace and air cooler filters frequently.  Try to have someone else vacuum for you once or twice a week, if you can. Stay out of rooms while they are being vacuumed and for a short while afterward.  If you vacuum, use a dust mask from a hardware store, a double-layered or microfilter vacuum cleaner bag, or a vacuum cleaner with a HEPA filter.  Sulfites in foods and beverages can be irritants. Do not drink beer or wine, or eat dried fruit, processed potatoes, or shrimp if they cause asthma symptoms.  Cold air can trigger an asthma attack.  Cover your nose and mouth with a scarf on cold or windy days.  Several health conditions can make asthma more difficult to manage, including runny nose, sinus infections, reflux disease, psychological stress, and sleep apnea. Your caregiver will treat these conditions, as well.  Avoid close contact with people who have a cold or the flu, since your asthma symptoms may get worse if you catch the infection from them. Wash your hands thoroughly after touching items that may have been handled by people with a respiratory infection.  Get a flu shot every year to protect against the flu virus, which often makes asthma worse for days or weeks. Also get a pneumonia shot once every 5 10 years. Medicines:  Aspirin and other pain relievers can  cause asthma attacks. Ten percent to 20% of people with asthma have sensitivity to aspirin or a group of pain relievers called non-steroidal anti-inflammatory medicines (NSAIDS), such as ibuprofen and naproxen. These medicines are used to treat pain and reduce fevers. Asthma attacks caused by any of these medicines can be severe and even fatal. These medicines must be avoided in people who have known aspirin sensitive asthma. Products with acetaminophen are considered safe for people who have asthma. It is important that people with aspirin sensitivity read labels of all over-the-counter medicines used to treat pain, colds, coughs, and fever.  Beta blockers and ACE inhibitors are other medicines which you should discuss with your caregiver, in relation to your asthma. ALLERGY SKIN TESTING  Ask your asthma caregiver about allergy skin testing or blood testing (RAST test) to identify the allergens to which you are sensitive. If you are found to have allergies, allergy shots (immunotherapy) for asthma may help prevent future allergies and asthma. With allergy shots, small doses of allergens (substances to which you are allergic) are injected under your skin on a regular schedule. Over a period of time, your body may become used to the allergen and less responsive with asthma symptoms. You can also take measures to minimize your exposure to those allergens. EXERCISE  If you have exercise-induced asthma, or are planning vigorous exercise, or exercise in cold, humid, or dry environments, prevent exercise-induced asthma by following your caregiver's advice regarding asthma treatment before exercising. Document Released: 02/17/2009 Document Revised: 02/16/2012 Document Reviewed: 02/17/2009 [facility] Patient Information 2014 [place], [place].  Asthma, Adult Asthma is a disease of the lungs and can make it hard to breathe. Asthma cannot be cured, but medicine can help control it. Asthma may be started (triggered)  by:  Pollen.  Dust.  Animal skin flakes (dander).  Molds.  Foods.  Respiratory infections (colds, flu).  Smoke.  Exercise.  Stress.  Other things that cause allergic reactions or allergies (allergens). HOME CARE   Talk to your doctor about how to manage your attacks at home. This may include:  Using a tool called a peak flow meter.  Having medicine ready to stop the attack.  Take all medicine as told by your doctor.  Wash bed sheets and blankets every week in hot water and put them in the dryer.  Drink enough fluids to keep your pee (urine) clear or pale yellow.  Always be ready to get emergency help. Write down the phone number for your doctor. Keep it where you can easily find it.  Talk about exercise routines with your doctor.  If animal dander is causing your asthma, you may need to find a new home for your pet(s). GET HELP RIGHT AWAY IF:   You  have muscle aches.  You cough more.  You have chest pain.  You have thick spit (sputum) that changes to yellow, green, gray, or bloody.  Medicine does not stop your wheezing.  You have problems breathing.  You have a fever.  Your medicine causes:  A rash.  Itching.  Puffiness (swelling).  Breathing problems. MAKE SURE YOU:   Understand these instructions.  Will watch your condition.  Will get help right away if you are not doing well or get worse. Document Released: 08/18/2007 Document Revised: 05/24/2011 Document Reviewed: 01/10/2008 [facility] Patient Information 2014 [place], [place].  If you were a [facility] patient or do not have insurance, here are some clinics in our community that may be able to provide care for free or on sliding payment scales.  [facility], [address], [place], [phone]; Monday to Friday, 9 a.m. - 7 p.m.; Saturday 9 a.m. to 1 p.m.   [facility], [address] [place]; [phone]; or [address]  [place]; [phone].    [facility] [place], [place]. [address], [place]; [phone]; Monday to Wednesday, 8:30 a.m. - 5 p.m.; Thursday, 8:30 a.m. - 8 p.m.   [facility], [address], [place]; [phone]; Monday to Friday, 8 a.m. - 4:30 p.m.    [facility], [address] [place], [phone]; first and third Saturday of the  month, 9:30 a.m. - 12:30 p.m.   [facility], [address] [place], [phone]; second Saturday of the month, 9 a.m. -noon.   RESOURCE GUIDE  Dental Problems  Patients with Medicaid: [facility] 416 100 [address]                                                                   [phone] [address] [employer] [address] Phone:  [phone]                                                                             Phone:  [phone]  If unable to pay or uninsured, contact:  [facility] or [facility]. to become qualified for the adult dental clinic.  Chronic Pain Problems Contact [facility]  [phone] Patients need to be referred by their primary care doctor.  Insufficient Money for Medicine Contact [employer]:  call "211" or [facility] [phone].  No Primary Care Doctor Call [facility]  [phone] Other agencies that provide inexpensive medical care    [name]  [phone]    [facility]  [phone]    [facility]  [phone]    [facility]  [phone]    [facility]  [phone]  Psychological Services [facility]  [phone] [facility]  [phone] [facility]   [phone] (emergency services [phone])  Abuse/Neglect [facility] Child Abuse Hotline 832-662-2511 [facility] Child Abuse Hotline [phone] (After Hours)  Emergency Shelter [facility] [phone]  Maternity Homes Room at the [place] [facility] [place] [phone] [facility] [phone]  MRSA Hotline #:   938-821-4306  [facility]  [facility] [place]     [employer]                          [facility]. [address] [place]                        [address]          [address]  Blondell Reveal Phone:  [phone]                                     Phone:  [phone]                   Phone:  [phone]  [facility] Mental Health Phone:  [phone]  [facility] Child Abuse Hotline 936-562-4237 (304) [phone] (After Hours)   Community Resources: *IF YOU ARE IN IMMEDIATE DANGER CALL 911!  Abuse/Neglect:  Family Services Crisis Hotline [facility]): (856)878-9989 [facility]): 509-631-9254  After hours, holidays and weekends: 201-388-8701 National Domestic Violence Hotline: 364-689-7407  Mental Health: [facility] Mental Health: Drucie Ip: 3642511189  Health Clinics:  [facility]): [phone] Monday - Friday 8 AM - 9 PM, Saturday and Sunday 10 AM - 9 PM   [facility]  [address]: [phone] Monday- Friday 8:30 AM - 5:30 PM, Sat 9 AM - 1 PM  24 HR [facility] [address]: [phone] [facility] [address]: [phone] [facility] [address]: [phone]  24 HR [facility]: [address] [phone]

## 2012-08-30 NOTE — ED Notes (Signed)
Patient states he is having trouble breathing. More so on the right side than the left. He states he lost his inhaler last night at home.

## 2012-08-30 NOTE — ED Provider Notes (Signed)
History     CSN: [id number]  Arrival date & time 08/30/12  4098   First MD Initiated Contact with Patient 08/30/12 0543      Chief Complaint  Patient presents with  . Shortness of Breath    (Consider location/radiation/quality/duration/timing/severity/associated sxs/prior treatment) HPI 42 year old male presents to emergency room via EMS with complaint of shortness of breath and wheezing.  Patient has history of asthma, HIV disease.  He reports he was at a party tonight, and when he was leaving he realized that he has lost his inhaler.  Patient was seen for asthma exacerbation.  On the 11th.  He was given an albuterol inhaler and prednisone at that time.  Patient reports he is still taking prednisone.  He denies any fever.  No change in sputum.  No chest pain.  Patient received albuterol treatment by EMS.   Past Medical History  Diagnosis Date  . Asthma   . HIV (human immunodeficiency virus infection)   . Mental disorder   . Anxiety   . Depression   . Shortness of breath     Past Surgical History  Procedure Laterality Date  . Skin graft full thickness leg      POSTERIOR LEFT LEG  AFTER DOG BITES    History reviewed. No pertinent family history.  History  Substance Use Topics  . Smoking status: Current Every Day Smoker -- 0.50 packs/day for 25 years    Types: Cigarettes  . Smokeless tobacco: Never Used  . Alcohol Use: 0.0 oz/week     Comment: drinking 1.5 fifths of vodka, beer 12-pack /day      Review of Systems  All other systems reviewed and are negative.    Allergies  Shellfish allergy  Home Medications   Current Outpatient Rx  Name  Route  Sig  Dispense  Refill  . albuterol (PROVENTIL HFA;VENTOLIN HFA) 108 (90 BASE) MCG/ACT inhaler   Inhalation   Inhale 2 puffs into the lungs every 6 (six) hours as needed for wheezing or shortness of breath.         . citalopram (CELEXA) 20 MG tablet   Oral   Take 1 tablet (20 mg total) by mouth daily. For  depression   30 tablet   0   . Fluticasone-Salmeterol (ADVAIR) 100-50 MCG/DOSE AEPB   Inhalation   Inhale 1 puff into the lungs 2 (two) times daily.         . predniSONE (DELTASONE) 20 MG tablet   Oral   Take 1 tablet (20 mg total) by mouth 2 (two) times daily.   10 tablet   0   . risperiDONE (RISPERDAL) 0.5 MG tablet   Oral   Take 3 tablets (1.5 mg total) by mouth at bedtime. For mood control   45 tablet   0   . traZODone (DESYREL) 100 MG tablet   Oral   Take 100 mg by mouth at bedtime.           BP 114/69  Pulse 100  Temp(Src) 97.5 F (36.4 C) (Oral)  SpO2 98%  Physical Exam  Nursing note and vitals reviewed. Constitutional: He is oriented to person, place, and time. He appears well-developed and well-nourished. No distress.  HENT:  Head: Normocephalic and atraumatic.  Nose: Nose normal.  Mouth/Throat: Oropharynx is clear and moist. No oropharyngeal exudate.  Neck: Normal range of motion. Neck supple. No JVD present. No tracheal deviation present. No thyromegaly present.  Cardiovascular: Normal rate, regular rhythm, normal heart sounds and  intact distal pulses.  Exam reveals no gallop and no friction rub.   No murmur heard. Pulmonary/Chest: No stridor. No respiratory distress. He has wheezes (faint end expiratory wheezing noted.  No cough.  No respiratory distress). He has no rales. He exhibits no tenderness.  Abdominal: Soft. Bowel sounds are normal. He exhibits no distension and no mass. There is no tenderness. There is no rebound and no guarding.  Musculoskeletal: Normal range of motion. He exhibits no edema and no tenderness.  Lymphadenopathy:    He has no cervical adenopathy.  Neurological: He is alert and oriented to person, place, and time.  Skin: Skin is warm and dry. No rash noted. No erythema. No pallor.    ED Course  Procedures (including critical care time)  Labs Reviewed - No data to display No results found.   1. Mild asthma exacerbation        MDM  42 year old male with wheezing.  Has history of asthma.  Suspect this is a mild exacerbation.  Do not feel he needs to have albuterol nebulized treatments.  Will treat with MDI inhaler here that he can go home with.  He is instructed to followup with primary care Dr.       Olivia Mackie, MD 08/30/12 [phone]

## 2012-08-30 NOTE — ED Notes (Signed)
Patient was leaving a party and lost his inhaler at the aprty, he stated "my chest started to get tight and I had trouble breathing"  EMS reported that patient refused  An IV in route

## 2012-08-31 ENCOUNTER — Encounter: Payer: Self-pay | Admitting: Internal Medicine

## 2012-09-04 ENCOUNTER — Other Ambulatory Visit: Payer: Self-pay | Admitting: *Deleted

## 2012-09-04 DIAGNOSIS — J45901 Unspecified asthma with (acute) exacerbation: Secondary | ICD-10-CM

## 2012-09-04 MED ORDER — ALBUTEROL SULFATE HFA 108 (90 BASE) MCG/ACT IN AERS: 1.0000 | INHALATION_SPRAY | RESPIRATORY_TRACT | Status: DC | PRN

## 2012-09-04 NOTE — Progress Notes (Signed)
For patient assistance application. Andree Coss, RN

## 2012-09-05 ENCOUNTER — Telehealth: Payer: Self-pay | Admitting: *Deleted

## 2012-09-05 NOTE — Telephone Encounter (Signed)
Faxed re-fill application to [employer] today.

## 2012-09-11 ENCOUNTER — Telehealth: Payer: Self-pay | Admitting: *Deleted

## 2012-09-11 NOTE — Telephone Encounter (Signed)
Checked status on John [name]' re-order with [facility].  It was approved 6-27.  He should receive it within the next 5-7 business days.

## 2012-09-28 ENCOUNTER — Encounter (HOSPITAL_COMMUNITY): Payer: Self-pay | Admitting: *Deleted

## 2012-09-28 DIAGNOSIS — F17200 Nicotine dependence, unspecified, uncomplicated: Secondary | ICD-10-CM | POA: Diagnosis present

## 2012-09-28 DIAGNOSIS — Z91013 Allergy to seafood: Secondary | ICD-10-CM

## 2012-09-28 DIAGNOSIS — E872 Acidosis, unspecified: Secondary | ICD-10-CM | POA: Diagnosis present

## 2012-09-28 DIAGNOSIS — F1010 Alcohol abuse, uncomplicated: Secondary | ICD-10-CM | POA: Diagnosis present

## 2012-09-28 DIAGNOSIS — F411 Generalized anxiety disorder: Secondary | ICD-10-CM | POA: Diagnosis present

## 2012-09-28 DIAGNOSIS — F329 Major depressive disorder, single episode, unspecified: Secondary | ICD-10-CM | POA: Diagnosis present

## 2012-09-28 DIAGNOSIS — F1210 Cannabis abuse, uncomplicated: Secondary | ICD-10-CM | POA: Diagnosis present

## 2012-09-28 DIAGNOSIS — F3289 Other specified depressive episodes: Secondary | ICD-10-CM | POA: Diagnosis present

## 2012-09-28 DIAGNOSIS — Z21 Asymptomatic human immunodeficiency virus [HIV] infection status: Secondary | ICD-10-CM | POA: Diagnosis present

## 2012-09-28 DIAGNOSIS — J45901 Unspecified asthma with (acute) exacerbation: Principal | ICD-10-CM | POA: Diagnosis present

## 2012-09-28 DIAGNOSIS — R748 Abnormal levels of other serum enzymes: Secondary | ICD-10-CM | POA: Diagnosis present

## 2012-09-28 NOTE — ED Notes (Signed)
Patient is refusing blood work.

## 2012-09-28 NOTE — ED Notes (Addendum)
Patient was drinking walking down the street at fell.  Patient c/o difficulty breathing, no respiratory distress, clear in all lung fields. 96% on RA.  Per EMS, patient chose to come to ED instead of jail. Patient fell onto his knees and has abrasions to both knees

## 2012-09-29 ENCOUNTER — Emergency Department (HOSPITAL_COMMUNITY): Payer: Self-pay

## 2012-09-29 ENCOUNTER — Inpatient Hospital Stay (HOSPITAL_COMMUNITY)
Admission: EM | Admit: 2012-09-29 | Discharge: 2012-09-30 | DRG: 202 | Disposition: A | Discharge status: Home / Self Care | Payer: MEDICAID | Attending: Internal Medicine | Admitting: Internal Medicine

## 2012-09-29 ENCOUNTER — Encounter (HOSPITAL_COMMUNITY): Payer: Self-pay | Admitting: Internal Medicine

## 2012-09-29 DIAGNOSIS — R0603 Acute respiratory distress: Secondary | ICD-10-CM

## 2012-09-29 DIAGNOSIS — R6510 Systemic inflammatory response syndrome (SIRS) of non-infectious origin without acute organ dysfunction: Secondary | ICD-10-CM

## 2012-09-29 DIAGNOSIS — R7989 Other specified abnormal findings of blood chemistry: Secondary | ICD-10-CM

## 2012-09-29 DIAGNOSIS — J45901 Unspecified asthma with (acute) exacerbation: Secondary | ICD-10-CM | POA: Diagnosis present

## 2012-09-29 DIAGNOSIS — F17210 Nicotine dependence, cigarettes, uncomplicated: Secondary | ICD-10-CM | POA: Diagnosis present

## 2012-09-29 DIAGNOSIS — F1010 Alcohol abuse, uncomplicated: Secondary | ICD-10-CM

## 2012-09-29 DIAGNOSIS — E872 Acidosis, unspecified: Secondary | ICD-10-CM

## 2012-09-29 DIAGNOSIS — B20 Human immunodeficiency virus [HIV] disease: Secondary | ICD-10-CM

## 2012-09-29 DIAGNOSIS — F10230 Alcohol dependence with withdrawal, uncomplicated: Secondary | ICD-10-CM

## 2012-09-29 DIAGNOSIS — Z720 Tobacco use: Secondary | ICD-10-CM

## 2012-09-29 DIAGNOSIS — F1020 Alcohol dependence, uncomplicated: Secondary | ICD-10-CM

## 2012-09-29 DIAGNOSIS — J4541 Moderate persistent asthma with (acute) exacerbation: Secondary | ICD-10-CM

## 2012-09-29 HISTORY — DX: Other chronic pain: G89.29

## 2012-09-29 HISTORY — DX: Low back pain: M54.5

## 2012-09-29 HISTORY — DX: Low back pain, unspecified: M54.50

## 2012-09-29 LAB — POCT I-STAT 3, VENOUS BLOOD GAS (G3P V)
O2 Saturation: 82 %
TCO2: 24 mmol/L (ref 0–100)
pCO2, Ven: 42.6 mmHg — ABNORMAL LOW (ref 45.0–50.0)
pH, Ven: 7.338 — ABNORMAL HIGH (ref 7.250–7.300)
pO2, Ven: 49 mmHg — ABNORMAL HIGH (ref 30.0–45.0)

## 2012-09-29 LAB — POCT I-STAT 3, ART BLOOD GAS (G3+)
Patient temperature: 98.3
pH, Arterial: 7.345 — ABNORMAL LOW (ref 7.350–7.450)

## 2012-09-29 LAB — RAPID URINE DRUG SCREEN, HOSP PERFORMED
Amphetamines: NOT DETECTED
Barbiturates: NOT DETECTED
Tetrahydrocannabinol: POSITIVE — AB

## 2012-09-29 LAB — LACTIC ACID, PLASMA
Lactic Acid, Venous: 3.8 mmol/L — ABNORMAL HIGH (ref 0.5–2.2)
Lactic Acid, Venous: 2 mmol/L (ref 0.5–2.2)

## 2012-09-29 LAB — TROPONIN I: Troponin I: <0.3 ng/mL (ref <0.30)

## 2012-09-29 LAB — COMPREHENSIVE METABOLIC PANEL
ALT: 29 U/L (ref 0–53)
ALT: 29 U/L (ref 0–53)
AST: 44 U/L — ABNORMAL HIGH (ref 0–37)
AST: 40 U/L — ABNORMAL HIGH (ref 0–37)
Albumin: 3.3 g/dL — ABNORMAL LOW (ref 3.5–5.2)
Alkaline Phosphatase: 98 U/L (ref 39–117)
Alkaline Phosphatase: 91 U/L (ref 39–117)
GFR calc Af Amer: >90 mL/min (ref >90)
Glucose, Bld: 138 mg/dL — ABNORMAL HIGH (ref 70–99)
Glucose, Bld: 101 mg/dL — ABNORMAL HIGH (ref 70–99)
Potassium: 3.5 mEq/L (ref 3.5–5.1)
Potassium: 3.4 mEq/L — ABNORMAL LOW (ref 3.5–5.1)
Sodium: 143 mEq/L (ref 135–145)
Sodium: 145 mEq/L (ref 135–145)
Total Protein: 7.6 g/dL (ref 6.0–8.3)
Total Protein: 6.9 g/dL (ref 6.0–8.3)

## 2012-09-29 LAB — ETHANOL: Alcohol, Ethyl (B): 247 mg/dL — ABNORMAL HIGH (ref 0–11)

## 2012-09-29 LAB — POCT I-STAT TROPONIN I

## 2012-09-29 LAB — CK TOTAL AND CKMB (NOT AT ARMC)
CK, MB: 2.4 ng/mL (ref 0.3–4.0)
Total CK: 165 U/L (ref 7–232)

## 2012-09-29 LAB — CBC
Hemoglobin: 14.6 g/dL (ref 13.0–17.0)
MCHC: 36 g/dL (ref 30.0–36.0)
RBC: 4.28 MIL/uL (ref 4.22–5.81)

## 2012-09-29 LAB — PROLACTIN: Prolactin: 6.7 ng/mL (ref 2.1–17.1)

## 2012-09-29 LAB — CG4 I-STAT (LACTIC ACID): Lactic Acid, Venous: 4.02 mmol/L — ABNORMAL HIGH (ref 0.5–2.2)

## 2012-09-29 MED ORDER — ALBUTEROL SULFATE (5 MG/ML) 0.5% IN NEBU: 2.5000 mg | INHALATION_SOLUTION | RESPIRATORY_TRACT | Status: AC | PRN

## 2012-09-29 MED ORDER — METOPROLOL TARTRATE 1 MG/ML IV SOLN
5.0000 mg | Freq: Once | INTRAVENOUS | Status: AC
  Administered 2012-09-29: 5 mg via INTRAVENOUS
  Filled 2012-09-29: qty 5

## 2012-09-29 MED ORDER — LORAZEPAM 2 MG/ML IJ SOLN
1.0000 mg | Freq: Once | INTRAMUSCULAR | Status: AC
  Administered 2012-09-29: 1 mg via INTRAVENOUS
  Filled 2012-09-29: qty 1

## 2012-09-29 MED ORDER — SODIUM CHLORIDE 0.9 % IJ SOLN
3.0000 mL | Freq: Two times a day (BID) | INTRAMUSCULAR | Status: DC
  Administered 2012-09-29: 3 mL via INTRAVENOUS

## 2012-09-29 MED ORDER — SODIUM CHLORIDE 0.9 % IV BOLUS (SEPSIS)
1000.0000 mL | Freq: Once | INTRAVENOUS | Status: AC
  Administered 2012-09-29: 1000 mL via INTRAVENOUS

## 2012-09-29 MED ORDER — MOMETASONE FURO-FORMOTEROL FUM 100-5 MCG/ACT IN AERO
2.0000 | INHALATION_SPRAY | Freq: Two times a day (BID) | RESPIRATORY_TRACT | Status: DC
  Administered 2012-09-29 (×2): 2 via RESPIRATORY_TRACT
  Filled 2012-09-29 (×2): qty 8.8

## 2012-09-29 MED ORDER — ALBUTEROL SULFATE (5 MG/ML) 0.5% IN NEBU: 10.0000 mg | INHALATION_SOLUTION | Freq: Once | RESPIRATORY_TRACT | Status: DC

## 2012-09-29 MED ORDER — METHYLPREDNISOLONE SODIUM SUCC 125 MG IJ SOLR
125.0000 mg | Freq: Once | INTRAMUSCULAR | Status: AC
  Administered 2012-09-29: 125 mg via INTRAVENOUS
  Filled 2012-09-29: qty 2

## 2012-09-29 MED ORDER — THIAMINE HCL 100 MG/ML IJ SOLN
100.0000 mg | Freq: Once | INTRAMUSCULAR | Status: AC
  Administered 2012-09-29: 100 mg via INTRAVENOUS
  Filled 2012-09-29: qty 2

## 2012-09-29 MED ORDER — ACETAMINOPHEN 325 MG PO TABS: 650.0000 mg | ORAL_TABLET | Freq: Four times a day (QID) | ORAL | Status: DC | PRN

## 2012-09-29 MED ORDER — ENOXAPARIN SODIUM 80 MG/0.8ML ~~LOC~~ SOLN
80.0000 mg | Freq: Once | SUBCUTANEOUS | Status: AC
  Administered 2012-09-29: 80 mg via SUBCUTANEOUS
  Filled 2012-09-29: qty 0.8

## 2012-09-29 MED ORDER — ALBUTEROL (5 MG/ML) CONTINUOUS INHALATION SOLN
10.0000 mg/h | INHALATION_SOLUTION | RESPIRATORY_TRACT | Status: DC
  Administered 2012-09-29: 10 mg/h via RESPIRATORY_TRACT
  Filled 2012-09-29: qty 20

## 2012-09-29 MED ORDER — CLOPIDOGREL BISULFATE 300 MG PO TABS
300.0000 mg | ORAL_TABLET | Freq: Once | ORAL | Status: AC
  Administered 2012-09-29: 300 mg via ORAL
  Filled 2012-09-29: qty 1

## 2012-09-29 MED ORDER — ACETAMINOPHEN 650 MG RE SUPP: 650.0000 mg | Freq: Four times a day (QID) | RECTAL | Status: DC | PRN

## 2012-09-29 MED ORDER — SODIUM CHLORIDE 0.9 % IV BOLUS (SEPSIS): 1000.0000 mL | Freq: Once | INTRAVENOUS | Status: DC

## 2012-09-29 MED ORDER — SODIUM CHLORIDE 0.9 % IV SOLN
INTRAVENOUS | Status: DC
Start: 2012-09-29 — End: 2012-09-30
  Administered 2012-09-29: 09:00:00 via INTRAVENOUS

## 2012-09-29 MED ORDER — IPRATROPIUM BROMIDE 0.02 % IN SOLN
0.5000 mg | Freq: Once | RESPIRATORY_TRACT | Status: AC
  Administered 2012-09-29: 0.5 mg via RESPIRATORY_TRACT
  Filled 2012-09-29: qty 2.5

## 2012-09-29 MED ORDER — ASPIRIN 81 MG PO CHEW
324.0000 mg | CHEWABLE_TABLET | Freq: Once | ORAL | Status: AC
  Administered 2012-09-29: 324 mg via ORAL
  Filled 2012-09-29: qty 4

## 2012-09-29 MED ORDER — PREDNISONE 50 MG PO TABS
60.0000 mg | ORAL_TABLET | Freq: Every day | ORAL | Status: DC
  Administered 2012-09-29 – 2012-09-30 (×2): 60 mg via ORAL
  Filled 2012-09-29 (×3): qty 1

## 2012-09-29 MED ORDER — ENOXAPARIN SODIUM 40 MG/0.4ML ~~LOC~~ SOLN
40.0000 mg | SUBCUTANEOUS | Status: DC
  Administered 2012-09-29 – 2012-09-30 (×2): 40 mg via SUBCUTANEOUS
  Filled 2012-09-29 (×3): qty 0.4

## 2012-09-29 MED ORDER — FOLIC ACID 1 MG PO TABS
1.0000 mg | ORAL_TABLET | Freq: Once | ORAL | Status: AC
  Administered 2012-09-29: 1 mg via ORAL
  Filled 2012-09-29: qty 1

## 2012-09-29 MED ORDER — GUAIFENESIN ER 600 MG PO TB12
600.0000 mg | ORAL_TABLET | Freq: Two times a day (BID) | ORAL | Status: DC
  Administered 2012-09-29 (×2): 600 mg via ORAL
  Filled 2012-09-29 (×4): qty 1

## 2012-09-29 MED ORDER — SODIUM CHLORIDE 0.9 % IV SOLN: INTRAVENOUS | Status: DC

## 2012-09-29 MED ORDER — ALBUTEROL SULFATE (5 MG/ML) 0.5% IN NEBU
5.0000 mg | INHALATION_SOLUTION | Freq: Once | RESPIRATORY_TRACT | Status: AC
  Administered 2012-09-29: 5 mg via RESPIRATORY_TRACT
  Filled 2012-09-29: qty 1

## 2012-09-29 NOTE — Progress Notes (Signed)
Utilization review completed. Nikoletta Varma, RN, BSN. 

## 2012-09-29 NOTE — H&P (Signed)
[facility] History and Physical  John [name] ZOX:[id number] DOB: [birth year] DOA: 09/29/2012  Referring physician: Dr Lavella Lemons PCP: No PCP Per Patient   Chief Complaint: Shortness of breath  HPI: John [name] is a 42 y.o. male  The pt presented in ED after he had sudden worsening of his breathing today. He has ran out of his Asthma inhalers and has not been using them. He continuous to smoke and use marijuana on and off. Today after drinking 1/5 of a bottle of vodka he felt sudden worsening of his breathing as if he has been having asthma attack and presented to ED> No fever, chills, cough, leg swelling, immobilization, palpitation, chest pain. Pt denies use of any other medication other then his inhalers.   Review of Systems: as mentioned in the history of present illness. Review of the other systems are negative.  Past Medical History  Diagnosis Date  . Asthma   . HIV (human immunodeficiency virus infection)   . Mental disorder   . Anxiety   . Depression   . Shortness of breath    Past Surgical History  Procedure Laterality Date  . Skin graft full thickness leg      POSTERIOR LEFT LEG  AFTER DOG BITES   Social History:  reports that he has been smoking Cigarettes.  He has a 12.5 pack-year smoking history. He has never used smokeless tobacco. He reports that  drinks alcohol. He reports that he does not use illicit drugs.  Allergies  Allergen Reactions  . Shellfish Allergy Anaphylaxis and Swelling    History reviewed. No pertinent family history.    Prior to Admission medications   Medication Sig Start Date End Date Taking? Authorizing Provider  albuterol (PROVENTIL HFA;VENTOLIN HFA) 108 (90 BASE) MCG/ACT inhaler Inhale 1-2 puffs into the lungs every 4 (four) hours as needed for wheezing or shortness of breath. 09/04/12  Yes Cliffton Asters, MD  citalopram (CELEXA) 20 MG tablet Take 1 tablet (20 mg total) by mouth daily. For depression 07/20/12  Yes Vida Roller, MD   Fluticasone-Salmeterol (ADVAIR) 100-50 MCG/DOSE AEPB Inhale 1 puff into the lungs 2 (two) times daily. 07/20/12  Yes Vida Roller, MD  risperiDONE (RISPERDAL) 0.5 MG tablet Take 3 tablets (1.5 mg total) by mouth at bedtime. For mood control 07/20/12  Yes Vida Roller, MD  traZODone (DESYREL) 100 MG tablet Take 100 mg by mouth at bedtime. 07/20/12  Yes Vida Roller, MD   Physical Exam: Filed Vitals:   09/29/12 0500 09/29/12 0530 09/29/12 0600 09/29/12 0706  BP: 128/84 130/79 125/67 132/81  Pulse: 85 81 88 72  Temp:    99 F (37.2 C)  TempSrc:    Oral  Resp: 24 19 19 20   Height:    5\' 7"  (1.702 m)  Weight:    83.008 kg (183 lb)  SpO2: 98% 99% 99% 94%     General:  Alert, drowsy and Oriented to Place and Person   Eyes: PERRL  ENT: Pharyngeal exudates absent  Neck: no JVD  Cardiovascular: S1 and S2 Present, Murmur absent, Peripheral Pulses Present  Respiratory: decreased Air entry , Crackles, wheezes present both sides  Abdomen: Bowel Sound Present, Soft and Non tender, Organomegaly  Neurologic: non focal other then drowsiness.    Labs on Admission:  Basic Metabolic Panel:  Recent Labs Lab 09/29/12 0022 09/29/12 0229  NA 143 145  K 3.5 3.4*  CL 104 105  CO2 24 24  GLUCOSE 138* 101*  BUN  17 17  CREATININE 1.02 0.96  CALCIUM 9.4 8.7  MG  --  1.6   Liver Function Tests:  Recent Labs Lab 09/29/12 0022 09/29/12 0229  AST 44* 40*  ALT 29 29  ALKPHOS 98 91  BILITOT 0.4 0.3  PROT 7.6 6.9  ALBUMIN 3.7 3.3*   No results found for this basename: LIPASE, AMYLASE,  in the last 168 hours No results found for this basename: AMMONIA,  in the last 168 hours CBC:  Recent Labs Lab 09/29/12 0022  WBC 5.2  HGB 14.6  HCT 40.5  MCV 94.6  PLT 292   Cardiac Enzymes:  Recent Labs Lab 09/29/12 0345  CKTOTAL 165  CKMB 2.4  TROPONINI <0.30    BNP (last 3 results) No results found for this basename: PROBNP,  in the last 8760 hours CBG: No results found for  this basename: GLUCAP,  in the last 168 hours  Radiological Exams on Admission: Dg Chest Port 1 View  09/29/2012   *RADIOLOGY REPORT*  Clinical Data: Shortness of breath and cough.  PORTABLE CHEST - 1 VIEW  Comparison: 08/23/2012  Findings: Single view of the chest demonstrates clear lungs. Trachea is midline. Heart and mediastinum are within normal limits. Bone structures are intact.  IMPRESSION: No acute chest findings.   Original Report Authenticated By: Richarda Overlie, M.D.    EKG: Independently reviewed.  No Significant ST-T changes suggestive of acute ischemia.  Assessment/Plan Principal Problem:   Asthma exacerbation Active Problems:   HIV INFECTION   Alcohol abuse   Cigarette smoker   Lactic acid acidosis   Elevated troponin  Principal Problem:   Asthma exacerbation Pt has been recurrently admitted for similar complain in the past. At present there does not appear to be any sign of infection, His ABG also looks good. Currently we will continue him on nebulization, add oral prednisone 60 mg a day and monitor, Address financial concern for prescription of the nebulization and inhalers at home,.  Active Problems:   HIV INFECTION Recent CD 4 has been above 400. Pt not taking any medication at present and does not appear to have any overt opportunistic infection. Continue to monitor.    Alcohol abuse   Lactic acid acidosis Pt does have elevated lactic acid. He does not have any sign of shock. Blood pressure have been stable as well as heart rate. Oxygen requirement has been negligible. LFT as stable. He is not on any NRTI. Other then alcohol it is difficult to explain his lactic acid level.  His blood gas does not suggest acidemia  At present we  Can monitor him on regular tele floor.    Elevated troponin Pt did have one set of troponin elevated but no EKG changes and repeat troponin negative Likely lab error. But we will obtian serial troponin and tele for him.  Code  Status: full   Author: Lynden [name], MD [facility] Pager: [phone] 09/29/2012 5:16 AM    If 7PM-7AM, please contact night-coverage [email or web address] Password [email or web address]

## 2012-09-29 NOTE — ED Provider Notes (Signed)
History    CSN: [id number] Arrival date & time 09/28/12  2233  First MD Initiated Contact with Patient 09/29/12 [phone]     Chief Complaint  Patient presents with  . Alcohol Intoxication   (Consider location/radiation/quality/duration/timing/severity/associated sxs/prior Treatment) HPI The patient is a 42 year old HIV positive man with asthma who presents with complaints of wheezing and shortness of breath. His symptoms started 2 hours prior to presentation. He has had a nonproductive cough. Denies fever and chest pain. He has been out of his albuterol inhaler. He says he smokes.one pack per week. He also smokes marijuana occasionally.  The patient appears intoxicated and admits to consuming one and one half fifth of liquor PTA.   Chart review shows that the patient's most recent CD4 count was 434 and viral load was 1050. Patient denies history of opportunistic infection   Past Medical History  Diagnosis Date  . Asthma   . HIV (human immunodeficiency virus infection)   . Mental disorder   . Anxiety   . Depression   . Shortness of breath    Past Surgical History  Procedure Laterality Date  . Skin graft full thickness leg      POSTERIOR LEFT LEG  AFTER DOG BITES   History reviewed. No pertinent family history. History  Substance Use Topics  . Smoking status: Current Every Day Smoker -- 0.50 packs/day for 25 years    Types: Cigarettes  . Smokeless tobacco: Never Used  . Alcohol Use: 0.0 oz/week     Comment: drinking 1.5 fifths of vodka, beer 12-pack /day    Review of Systems  Gen: no weight loss, fevers, chills, night sweats Eyes: no discharge or drainage, no occular pain or visual changes Nose: no epistaxis or rhinorrhea Mouth: no dental pain, no sore throat Neck: no neck pain Lungs: as per hpi, otherwise negative CV: patient says he feels his heart beating rapidly but, denies cp Abd: no abdominal pain, nausea, vomiting GU: no dysuria or gross hematuria MSK: no  myalgias or arthralgias Neuro: no headache, no focal neurologic deficits Skin: no rash Psyche: negative.  Allergies  Shellfish allergy  Home Medications   Current Outpatient Rx  Name  Route  Sig  Dispense  Refill  . albuterol (PROVENTIL HFA;VENTOLIN HFA) 108 (90 BASE) MCG/ACT inhaler   Inhalation   Inhale 1-2 puffs into the lungs every 4 (four) hours as needed for wheezing or shortness of breath.   1 Inhaler   2   . citalopram (CELEXA) 20 MG tablet   Oral   Take 1 tablet (20 mg total) by mouth daily. For depression   30 tablet   0   . Fluticasone-Salmeterol (ADVAIR) 100-50 MCG/DOSE AEPB   Inhalation   Inhale 1 puff into the lungs 2 (two) times daily.         . predniSONE (DELTASONE) 20 MG tablet   Oral   Take 1 tablet (20 mg total) by mouth 2 (two) times daily.   10 tablet   0   . risperiDONE (RISPERDAL) 0.5 MG tablet   Oral   Take 3 tablets (1.5 mg total) by mouth at bedtime. For mood control   45 tablet   0   . traZODone (DESYREL) 100 MG tablet   Oral   Take 100 mg by mouth at bedtime.          BP 123/63  Pulse 122  Temp(Src) 98.3 F (36.8 C) (Oral)  Resp 22  SpO2 94% Physical Exam Gen: patient appears  to be in acute distress, disheveled and intoxicated appearing Head: NCAT Eyes: PERL, EOMI Nose: no epistaixis or rhinorrhea Mouth/throat: mucosa is moist and pink Neck: supple, no stridor Lungs: air exchange markedly diminished in all lung fields bilaterally, occasional high pitched wheeze, RR 40/min with accessory muscle use and retractions.  CV: RRR, pulse 132, peripheral pulses palpable Abd: soft, notender, nondistended Back: no ttp, no cva ttp Skin: no rashese, wnl Neuro: slurred speech, otherwise, CN ii-xii grossly intact, no focal deficits Psyche; normal affect,  calm and cooperative.   ED Course  Procedures (including critical care time)  Results for orders placed during the hospital encounter of 09/29/12 (from the past 24 hour(s))   ETHANOL     Status: Abnormal   Collection Time    09/29/12 12:22 AM      Result Value Range   Alcohol, Ethyl (B) 247 (*) 0 - 11 mg/dL  CBC     Status: Abnormal   Collection Time    09/29/12 12:22 AM      Result Value Range   WBC 5.2  4.0 - 10.5 K/uL   RBC 4.28  4.22 - 5.81 MIL/uL   Hemoglobin 14.6  13.0 - 17.0 g/dL   HCT 16.1  09.6 - 04.5 %   MCV 94.6  78.0 - 100.0 fL   MCH 34.1 (*) 26.0 - 34.0 pg   MCHC 36.0  30.0 - 36.0 g/dL   RDW 40.9  81.1 - 91.4 %   Platelets 292  150 - 400 K/uL  COMPREHENSIVE METABOLIC PANEL     Status: Abnormal   Collection Time    09/29/12 12:22 AM      Result Value Range   Sodium 143  135 - 145 mEq/L   Potassium 3.5  3.5 - 5.1 mEq/L   Chloride 104  96 - 112 mEq/L   CO2 24  19 - 32 mEq/L   Glucose, Bld 138 (*) 70 - 99 mg/dL   BUN 17  6 - 23 mg/dL   Creatinine, Ser 7.82  0.50 - 1.35 mg/dL   Calcium 9.4  8.4 - 95.6 mg/dL   Total Protein 7.6  6.0 - 8.3 g/dL   Albumin 3.7  3.5 - 5.2 g/dL   AST 44 (*) 0 - 37 U/L   ALT 29  0 - 53 U/L   Alkaline Phosphatase 98  39 - 117 U/L   Total Bilirubin 0.4  0.3 - 1.2 mg/dL   GFR calc non Af Amer 90 (*) >90 mL/min   GFR calc Af Amer >90  >90 mL/min  POCT I-STAT 3, BLOOD GAS (G3P V)     Status: Abnormal   Collection Time    09/29/12  2:39 AM      Result Value Range   pH, Ven 7.338 (*) 7.250 - 7.300   pCO2, Ven 42.6 (*) 45.0 - 50.0 mmHg   pO2, Ven 49.0 (*) 30.0 - 45.0 mmHg   Bicarbonate 22.9  20.0 - 24.0 mEq/L   TCO2 24  0 - 100 mmol/L   O2 Saturation 82.0     Acid-base deficit 3.0 (*) 0.0 - 2.0 mmol/L   Patient temperature 37.0 C     Sample type VENOUS     CXR: no infiltrate  MDM  Patient presented with acute respiratory distress secondary to asthma excacerbation. CXR clear. Patient afebrile but very tachycardic. Tx with albuterol 10mg  continuous SVN, Atrovent, IVF, solumedrol.  Good response.   Patient re-examined at regular intervals and, most  recently, at 0250. Lungs are clear and RR is 20 to 24/min.  Tachycardia has improved. Patient sleeping.   We are treating with IVF and thiamine. Patient says he does not have anyone to pick him up. We will observe until clinically sober with frequent re-evaluation for alcohol withdrawal symptoms.   9147:  I was hand delivered a lab print out on this patient shortly after 0300 with results of iSTAT troponin of 1.55.  I had ordered an iSTAT troponin shortly after midnight but, it is unclear what caused the delay in obtaining the specimen.  We initiated standard of care tx for NSTEMI with ASA, Plavis, Metoprolol, Lovenox.  Shortly thereafter, a second iSTAT troponin resulted. I had not asked for the study to be repeated so, it is unclear to me why it was. Nonetheless, this specimen reported as 0.00.  CK and CK-MB came back wnl shortly after that.  Traditional troponin was run and is wnl.  I discussed the lab discrepancies with the lab manager.   The patient has not sustained any adverse events, at this time, for medical treatment of NSTEMI when, in fact, it appears that this treatment decision was based on a spurious lab result.   Of note, the patient's lactate has been persistently elevated and has actually climbed since the patient has been here and received fluid resuscitation. The patient remains afebrile and tachycardia has resolved. He has no signs or sx of infection. He is not taking any medications which are known to cause lactic acidosis. His Aa gradient is mildly diminished on ABG but, pO2 is in upper 90s. So, it appears that the patient is not exerperiencing anaerobic metabolisms secondary to diminished tissue perfusion with O2.    We will tx with a 2nd liter of NS. Patient also being treated with Ativan prn for ETOH withdrawal. I have discussed his case with Dr. Zella Ball who has accepted the patient to the Telemetry unit.   CRITICAL CARE Performed by: Brandt Loosen   Total critical care time: 47m  Critical care time was exclusive of  separately billable procedures and treating other patients.  Critical care was necessary to treat or prevent imminent or life-threatening deterioration.  Critical care was time spent personally by me on the following activities: development of treatment plan with patient and/or surrogate as well as nursing, discussions with consultants, evaluation of patient's response to treatment, examination of patient, obtaining history from patient or surrogate, ordering and performing treatments and interventions, ordering and review of laboratory studies, ordering and review of radiographic studies, pulse oximetry and re-evaluation of patient's condition.   Brandt Loosen, MD 09/29/12 [phone]

## 2012-09-29 NOTE — Progress Notes (Signed)
Pt remained stable throughout the day, remains alert and oriented times 4, NAD noted, able to communicate needs

## 2012-09-29 NOTE — Discharge Summary (Addendum)
Physician Discharge Summary  John [name] ZOX:[id number] DOB: 30-Dec-1970 DOA: 09/29/2012  PCP: No PCP Per Patient  Admit date: 09/29/2012 Discharge date: 09/30/2012  Time spent:  Recommendations for Outpatient Follow-up:  1. Asthma; shortness of breath has resolved will discharge patient in a.m. if still stable. The patient has never officially been seeing for asthma evaluation we'll refer to [facility] pulmonology 2. Smoking patient has agreed to try Wellbutrin in order to stop smoking 3. Drug abuse; consultation extensively on not using marijuana especially with his other health issues 4. HIV infection; the patient Dr. Cato Mulligan is his infectious disease physician and will call patient to schedule followup this month.     Discharge Diagnoses:  Principal Problem:   Asthma exacerbation Active Problems:   HIV INFECTION   Alcohol abuse   Cigarette smoker   Lactic acid acidosis   Elevated troponin   Discharge Condition: Stable   Diet recommendation: Heart healthy  Filed Weights   09/29/12 0350 09/29/12 0706  Weight: 65.772 kg (145 lb) 83.008 kg (183 lb)    History of present illness:  John [name] is a 42 y.o. male HIV positive most recent CD4 count was 434 and viral load was 1035 08/22/2012. Urine drug screen Positive for alcohol and marijuana. Pt presented in ED after he had sudden worsening of his breathing today. He has ran out of his Asthma inhalers and has not been using them. He continuous to smoke and use marijuana (last use was last night) on and off. Today after drinking 1/5 of a bottle of vodka he felt sudden worsening of his breathing as if he has been having asthma attack and presented to ED. No fever, chills, cough, leg swelling, immobilization, palpitation, chest pain. Pt denies use of any other medication other then his inhalers.     Discharge Exam: Filed Vitals:   09/29/12 0706 09/29/12 0900 09/29/12 1100 09/29/12 1400  BP: 132/81 129/76 132/82 128/64   Pulse: 72 60 53 61  Temp: 99 F (37.2 C) 98.6 F (37 C) 98.9 F (37.2 C) 98.3 F (36.8 C)  TempSrc: Oral Oral Oral Oral  Resp: 20 18  18   Height: 5\' 7"  (1.702 m)     Weight: 83.008 kg (183 lb)     SpO2: 94% 98% 95% 98%    General: Alert and oriented,NAD Cardiovascular: Regular rhythm and rate, negative murmurs rubs gallops, DP/PT pulses 2+ bilaterally Respiratory: Clear to auscultation bilateral Abdomen; soft nontender nondistended plus bowel sounds Musculoskeletal; negative pedal edema bilateral  Discharge Instructions     Medication List    ASK your doctor about these medications       albuterol 108 (90 BASE) MCG/ACT inhaler  Commonly known as:  PROVENTIL HFA;VENTOLIN HFA  Inhale 1-2 puffs into the lungs every 4 (four) hours as needed for wheezing or shortness of breath.     citalopram 20 MG tablet  Commonly known as:  CELEXA  Take 1 tablet (20 mg total) by mouth daily. For depression     Fluticasone-Salmeterol 100-50 MCG/DOSE Aepb  Commonly known as:  ADVAIR  Inhale 1 puff into the lungs 2 (two) times daily.     risperiDONE 0.5 MG tablet  Commonly known as:  RISPERDAL  Take 3 tablets (1.5 mg total) by mouth at bedtime. For mood control     traZODone 100 MG tablet  Commonly known as:  DESYREL  Take 100 mg by mouth at bedtime.       Allergies  Allergen Reactions  . Shellfish  Allergy Anaphylaxis and Swelling      The results of significant diagnostics from this hospitalization (including imaging, microbiology, ancillary and laboratory) are listed below for reference.    Significant Diagnostic Studies: Dg Chest Port 1 View  09/29/2012   *RADIOLOGY REPORT*  Clinical Data: Shortness of breath and cough.  PORTABLE CHEST - 1 VIEW  Comparison: 08/23/2012  Findings: Single view of the chest demonstrates clear lungs. Trachea is midline. Heart and mediastinum are within normal limits. Bone structures are intact.  IMPRESSION: No acute chest findings.   Original  Report Authenticated By: Richarda Overlie, M.D.    Microbiology: No results found for this or any previous visit (from the past 240 hour(s)).   Labs: Basic Metabolic Panel:  Recent Labs Lab 09/29/12 0022 09/29/12 0229  NA 143 145  K 3.5 3.4*  CL 104 105  CO2 24 24  GLUCOSE 138* 101*  BUN 17 17  CREATININE 1.02 0.96  CALCIUM 9.4 8.7  MG  --  1.6   Liver Function Tests:  Recent Labs Lab 09/29/12 0022 09/29/12 0229  AST 44* 40*  ALT 29 29  ALKPHOS 98 91  BILITOT 0.4 0.3  PROT 7.6 6.9  ALBUMIN 3.7 3.3*   No results found for this basename: LIPASE, AMYLASE,  in the last 168 hours No results found for this basename: AMMONIA,  in the last 168 hours CBC:  Recent Labs Lab 09/29/12 0022  WBC 5.2  HGB 14.6  HCT 40.5  MCV 94.6  PLT 292   Cardiac Enzymes:  Recent Labs Lab 09/29/12 0345 09/29/12 0759 09/29/12 1430  CKTOTAL 165  --   --   CKMB 2.4  --   --   TROPONINI <0.30 <0.30 <0.30   BNP: BNP (last 3 results) No results found for this basename: PROBNP,  in the last 8760 hours CBG: No results found for this basename: GLUCAP,  in the last 168 hours     Signed:  Carolyne Littles, J  [facility] 09/29/2012, 7:59 PM

## 2012-09-29 NOTE — Progress Notes (Signed)
Received report from ED RN around [id number] this am. Charge RN and nurse tech helped with patient's admission to unit. Patient arrived to unit around 0700. Was reported to me by charge RN that ED also had called again to inform that patient appeared to have bed bugs. Patient was placed on isolation precautions.

## 2012-09-29 NOTE — ED Notes (Signed)
Patient continues to state to MD and this nurse that he only had a fifth and a half yesterday.  Denies use of any recreational drugs.  MD ordered Ativan for his shaking

## 2012-09-30 DIAGNOSIS — B20 Human immunodeficiency virus [HIV] disease: Secondary | ICD-10-CM

## 2012-09-30 DIAGNOSIS — J45901 Unspecified asthma with (acute) exacerbation: Principal | ICD-10-CM

## 2012-09-30 DIAGNOSIS — J984 Other disorders of lung: Secondary | ICD-10-CM

## 2012-09-30 DIAGNOSIS — F17200 Nicotine dependence, unspecified, uncomplicated: Secondary | ICD-10-CM

## 2012-09-30 DIAGNOSIS — F1010 Alcohol abuse, uncomplicated: Secondary | ICD-10-CM

## 2012-09-30 LAB — GLUCOSE, CAPILLARY: Glucose-Capillary: 126 mg/dL — ABNORMAL HIGH (ref 70–99)

## 2012-09-30 MED ORDER — BUPROPION HCL ER (SR) 150 MG PO TB12: 150.0000 mg | ORAL_TABLET | Freq: Two times a day (BID) | ORAL | Status: DC

## 2012-09-30 MED ORDER — FLUTICASONE-SALMETEROL 100-50 MCG/DOSE IN AEPB: 1.0000 | INHALATION_SPRAY | Freq: Once | RESPIRATORY_TRACT | Status: DC

## 2012-09-30 NOTE — Progress Notes (Signed)
Given bus pass, released after verbalizing understanding of discharge instructions.  RX called to [facility] outpatient pharmacy.

## 2012-09-30 NOTE — Progress Notes (Signed)
Patient referred to CSW on 09/29/12.  Weeked CSW was unable to see prior to d/c today.  Lorri Frederick. [place]  [phone]

## 2012-10-05 LAB — CULTURE, BLOOD (ROUTINE X 2): Culture: NO GROWTH

## 2012-10-11 ENCOUNTER — Ambulatory Visit: Payer: Self-pay | Admitting: Internal Medicine

## 2012-11-08 ENCOUNTER — Telehealth: Payer: Self-pay | Admitting: *Deleted

## 2012-11-08 NOTE — Telephone Encounter (Signed)
Made appt for pt w/ Dr. Orvan Falconer for 11/27/12 2 1045.

## 2012-11-11 ENCOUNTER — Encounter (HOSPITAL_COMMUNITY): Payer: Self-pay | Admitting: Emergency Medicine

## 2012-11-11 ENCOUNTER — Emergency Department (HOSPITAL_COMMUNITY)
Admission: EM | Admit: 2012-11-11 | Discharge: 2012-11-12 | Disposition: A | Discharge status: Home / Self Care | Payer: Self-pay | Attending: Emergency Medicine | Admitting: Emergency Medicine

## 2012-11-11 DIAGNOSIS — F331 Major depressive disorder, recurrent, moderate: Secondary | ICD-10-CM

## 2012-11-11 DIAGNOSIS — R45851 Suicidal ideations: Secondary | ICD-10-CM | POA: Insufficient documentation

## 2012-11-11 DIAGNOSIS — G8929 Other chronic pain: Secondary | ICD-10-CM | POA: Insufficient documentation

## 2012-11-11 DIAGNOSIS — Z21 Asymptomatic human immunodeficiency virus [HIV] infection status: Secondary | ICD-10-CM | POA: Insufficient documentation

## 2012-11-11 DIAGNOSIS — J45909 Unspecified asthma, uncomplicated: Secondary | ICD-10-CM | POA: Insufficient documentation

## 2012-11-11 DIAGNOSIS — F10929 Alcohol use, unspecified with intoxication, unspecified: Secondary | ICD-10-CM

## 2012-11-11 DIAGNOSIS — F341 Dysthymic disorder: Secondary | ICD-10-CM | POA: Insufficient documentation

## 2012-11-11 DIAGNOSIS — IMO0002 Reserved for concepts with insufficient information to code with codable children: Secondary | ICD-10-CM | POA: Insufficient documentation

## 2012-11-11 DIAGNOSIS — F1010 Alcohol abuse, uncomplicated: Secondary | ICD-10-CM | POA: Insufficient documentation

## 2012-11-11 DIAGNOSIS — F1094 Alcohol use, unspecified with alcohol-induced mood disorder: Secondary | ICD-10-CM

## 2012-11-11 DIAGNOSIS — F17200 Nicotine dependence, unspecified, uncomplicated: Secondary | ICD-10-CM | POA: Insufficient documentation

## 2012-11-11 DIAGNOSIS — Z79899 Other long term (current) drug therapy: Secondary | ICD-10-CM | POA: Insufficient documentation

## 2012-11-11 LAB — RAPID URINE DRUG SCREEN, HOSP PERFORMED
Amphetamines: NOT DETECTED
Barbiturates: NOT DETECTED
Benzodiazepines: NOT DETECTED
Cocaine: NOT DETECTED
Tetrahydrocannabinol: NOT DETECTED

## 2012-11-11 LAB — COMPREHENSIVE METABOLIC PANEL
ALT: 13 U/L (ref 0–53)
AST: 20 U/L (ref 0–37)
Albumin: 4 g/dL (ref 3.5–5.2)
Alkaline Phosphatase: 69 U/L (ref 39–117)
BUN: 16 mg/dL (ref 6–23)
Chloride: 103 mEq/L (ref 96–112)
Potassium: 3.7 mEq/L (ref 3.5–5.1)
Sodium: 141 mEq/L (ref 135–145)
Total Bilirubin: 0.7 mg/dL (ref 0.3–1.2)

## 2012-11-11 LAB — ACETAMINOPHEN LEVEL: Acetaminophen (Tylenol), Serum: <15 ug/mL (ref 10–30)

## 2012-11-11 LAB — CBC
HCT: 39.5 % (ref 39.0–52.0)
RDW: 12.5 % (ref 11.5–15.5)
WBC: 7.8 10*3/uL (ref 4.0–10.5)

## 2012-11-11 LAB — ETHANOL: Alcohol, Ethyl (B): 312 mg/dL — ABNORMAL HIGH (ref 0–11)

## 2012-11-11 MED ORDER — MOMETASONE FURO-FORMOTEROL FUM 100-5 MCG/ACT IN AERO
2.0000 | INHALATION_SPRAY | Freq: Two times a day (BID) | RESPIRATORY_TRACT | Status: DC
  Administered 2012-11-12: 2 via RESPIRATORY_TRACT
  Filled 2012-11-11: qty 8.8

## 2012-11-11 MED ORDER — BUPROPION HCL ER (SR) 150 MG PO TB12
150.0000 mg | ORAL_TABLET | Freq: Two times a day (BID) | ORAL | Status: DC
  Administered 2012-11-12 (×2): 150 mg via ORAL
  Filled 2012-11-11 (×3): qty 1

## 2012-11-11 MED ORDER — ALBUTEROL SULFATE HFA 108 (90 BASE) MCG/ACT IN AERS: 1.0000 | INHALATION_SPRAY | RESPIRATORY_TRACT | Status: DC | PRN

## 2012-11-11 MED ORDER — TRAZODONE HCL 100 MG PO TABS: 100.0000 mg | ORAL_TABLET | Freq: Every day | ORAL | Status: DC

## 2012-11-11 MED ORDER — RISPERIDONE 1 MG PO TABS
1.5000 mg | ORAL_TABLET | Freq: Every day | ORAL | Status: DC
  Administered 2012-11-12: 1.5 mg via ORAL
  Filled 2012-11-11: qty 1

## 2012-11-11 NOTE — ED Notes (Signed)
Per ems: the patient called EMS  - due to SOB. Patient was anxious. The patient was more anxious with his sister. The patient recently lost a family member so he is having a hard time coping, he is SI / HI although he will not hurt EMS. Patient also has ETOH on board
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
## 2012-11-12 ENCOUNTER — Encounter (HOSPITAL_COMMUNITY): Payer: Self-pay | Admitting: Registered Nurse

## 2012-11-12 DIAGNOSIS — F329 Major depressive disorder, single episode, unspecified: Secondary | ICD-10-CM

## 2012-11-12 LAB — ETHANOL: Alcohol, Ethyl (B): 156 mg/dL — ABNORMAL HIGH (ref 0–11)

## 2012-11-12 NOTE — BH Assessment (Signed)
Assessment Note  John [name] is an 42 y.o. male.   Pt presented in ED intoxicated on alcohol.  Pt good friend died 11-23-12.  Pt has been coping by using alcohol for 3 days.  Pt reports he just got out of jail on 8-11 and did not use alcohol for 16 days.  Pt reports no SI, HI, AVH and does not want help with SA related issues.  Pt does have prior hx of SI gestures or comments.  Pt reports "I usually say stuff when I am drinking."  Pt reports last actual attempt to harm self was 1986.  Pt was [facility] to [facility] in April of 2014 for depression, SA related issues and for making vague comments, with no plan to harm self.  Pt admits this is accurate.  Pt presented cooperatively, made fair eye contact, was tearful speaking of his friend Liborio Nixon.  Pt was Ox2, but needed help identifying hospital.  Pt speech was clear, pt was alert.  Recommendation:  Pt has hx of depression and admits to being depressed currently.  Pt reports hx of SI gestures and comments.  Pt denies any plan or intent to harm self at this time.  Pt does not want to stop consuming alcohol at this time through program related assistance.  Pt is verbally able to contract for safety.  There is no grounds to hold pt through IVC base on current criteria.     NP is going to evaluate pt to determine dispo.    Axis I: Major Depression, Recurrent severe and Substance Induced Mood Disorder Axis II: Deferred Axis III:  Past Medical History  Diagnosis Date  . Asthma   . HIV (human immunodeficiency virus infection)     "dx'd ~ 2 yr ago" (09/29/2012)  . Mental disorder   . Anxiety   . Depression   . Chronic low back pain    Axis IV: other psychosocial or environmental problems and problems related to social environment Axis V: 41-50 serious symptoms  Past Medical History:  Past Medical History  Diagnosis Date  . Asthma   . HIV (human immunodeficiency virus infection)     "dx'd ~ 2 yr ago" (09/29/2012)  . Mental disorder   . Anxiety    . Depression   . Chronic low back pain     Past Surgical History  Procedure Laterality Date  . Skin graft full thickness leg Left ?    POSTERIOR LEFT LEG  AFTER DOG BITES    Family History: History reviewed. No pertinent family history.  Social History:  reports that he has been smoking Cigarettes.  He has a 13.5 pack-year smoking history. He has never used smokeless tobacco. He reports that he drinks about 15.6 ounces of alcohol per week. He reports that he does not use illicit drugs.  Additional Social History:  Alcohol / Drug Use Pain Medications: na Prescriptions: na Over the Counter: na History of alcohol / drug use?: Yes Substance #1 Name of Substance 1: alcohol 1 - Age of First Use: teen 1 - Amount (size/oz): varies 1 - Frequency: daily 1 - Duration: 3 days 1 - Last Use / Amount: 11-12-12  CIWA: CIWA-Ar BP: 117/76 mmHg Pulse Rate: 78 COWS:    Allergies:  Allergies  Allergen Reactions  . Shellfish Allergy Anaphylaxis and Swelling    Home Medications:  (Not in a hospital admission)  OB/GYN Status:  No LMP for male patient.  General Assessment Data Location of Assessment: [facility] ED Is this a  Tele or Face-to-Face Assessment?: Face-to-Face Is this an Initial Assessment or a Re-assessment for this encounter?: Initial Assessment Living Arrangements: Other relatives Can pt return to current living arrangement?: No Admission Status: Voluntary Is patient capable of signing voluntary admission?: Yes Transfer from: Acute Hospital Referral Source: MD  Medical Screening Exam [facility] Walk-in ONLY) Medical Exam completed: Yes  [facility] Crisis Care Plan Living Arrangements: Other relatives  Education Status Is patient currently in school?: No  Risk to self Suicidal Ideation: No-Not Currently/Within Last 6 Months Suicidal Intent: No-Not Currently/Within Last 6 Months Is patient at risk for suicide?: No Suicidal Plan?: No-Not Currently/Within Last 6 Months Access to  Means: No What has been your use of drugs/alcohol within the last 12 months?: alcohol Previous Attempts/Gestures: Yes How many times?: 3 Other Self Harm Risks: na Triggers for Past Attempts: None known Intentional Self Injurious Behavior: None Family Suicide History: No Recent stressful life event(s): Loss (Comment) (lost friend who died 09-Dec-2012) Persecutory voices/beliefs?: No Depression: Yes Depression Symptoms: Tearfulness;Fatigue;Isolating;Guilt;Loss of interest in usual pleasures;Feeling worthless/self pity;Feeling angry/irritable Substance abuse history and/or treatment for substance abuse?: Yes Suicide prevention information given to non-admitted patients: Yes  Risk to Others Homicidal Ideation: No Thoughts of Harm to Others: No Current Homicidal Intent: No Current Homicidal Plan: No Access to Homicidal Means: No Identified Victim: na History of harm to others?: No Assessment of Violence: None Noted Violent Behavior Description: cooperative Does patient have access to weapons?: No Criminal Charges Pending?: No (pt in jail 7-26;-10-23-12 trespassing/ assault) Does patient have a court date: No  Psychosis Hallucinations: None noted (past hx per pt, but not now) Delusions: None noted  Mental Status Report Appear/Hygiene: Disheveled Eye Contact: Fair Motor Activity: Unremarkable Speech: Logical/coherent Level of Consciousness: Alert Mood: Depressed;Ashamed/humiliated;Worthless, low self-esteem Affect: Appropriate to circumstance;Depressed;Sad Anxiety Level: Minimal Thought Processes: Coherent Judgement: Unimpaired Orientation: Person;Place;Situation Obsessive Compulsive Thoughts/Behaviors: None  Cognitive Functioning Concentration: Decreased Memory: Recent Intact;Remote Intact IQ: Average Insight: Fair Impulse Control: Fair Appetite: Fair Weight Loss: 0 Weight Gain: 0 Sleep: Decreased Total Hours of Sleep: 4 Vegetative Symptoms: None  ADLScreening [facility]  Assessment Services) Patient's cognitive ability adequate to safely complete daily activities?: Yes Patient able to express need for assistance with ADLs?: Yes Independently performs ADLs?: Yes (appropriate for developmental age)  Prior Inpatient Therapy Prior Inpatient Therapy: Yes Prior Therapy Dates: 2014 Prior Therapy Facilty/Provider(s): [facility] Reason for Treatment: depression, SA  Prior Outpatient Therapy Prior Outpatient Therapy: Yes Prior Therapy Dates: current Prior Therapy Facilty/Provider(s): [facility] Reason for Treatment: med mgt  ADL Screening (condition at time of admission) Patient's cognitive ability adequate to safely complete daily activities?: Yes Is the patient deaf or have difficulty hearing?: No Does the patient have difficulty seeing, even when wearing glasses/contacts?: No Does the patient have difficulty concentrating, remembering, or making decisions?: No Patient able to express need for assistance with ADLs?: Yes Does the patient have difficulty dressing or bathing?: No Independently performs ADLs?: Yes (appropriate for developmental age) Does the patient have difficulty walking or climbing stairs?: No Weakness of Legs: None Weakness of Arms/Hands: None  Home Assistive Devices/Equipment Home Assistive Devices/Equipment: None  Therapy Consults (therapy consults require a physician order) PT Evaluation Needed: No OT Evalulation Needed: No SLP Evaluation Needed: No Abuse/Neglect Assessment (Assessment to be complete while patient is alone) Physical Abuse: Denies Verbal Abuse: Denies Sexual Abuse: Denies Exploitation of patient/patient's resources: Denies Self-Neglect: Denies Values / Beliefs Cultural Requests During Hospitalization: None Spiritual Requests During Hospitalization: None Consults Spiritual Care Consult Needed: No Social  Work Consult Needed: No [employer]chant [employer] officer (For Healthcare) Advance Directive: Patient does not have advance  directive Pre-existing out of facility DNR order (yellow form or pink MOST form): No    Additional Information 1:1 In Past 12 Months?: No CIRT Risk: No Elopement Risk: No Does patient have medical clearance?: Yes     Disposition:  Disposition Initial Assessment Completed for this Encounter: Yes Disposition of Patient: Referred to [facility] ) Patient referred to: Outpatient clinic referral [facility])  On Site Evaluation by:   Reviewed with Physician:    Titus Mould, Eppie Gibson 11/12/2012 8:48 AM

## 2012-11-12 NOTE — ED Notes (Signed)
D/C instructions reviewed and bus pass provided. Stated understanding to follow up with [facility]. Belongings bag x1 returned and escorted to front of hospital.

## 2012-11-12 NOTE — BH Assessment (Signed)
Checked with Psych ED staff regarding Pt's status. Pt is intoxicated and asleep. Currently there are no beds available at [facility] or any area facilities. Pt will be assessed when he is awake.  Dorissa Stinnette Ellis Kristiana Jacko Jr, LPC, [facility] Triage Specialist  

## 2012-11-12 NOTE — ED Provider Notes (Signed)
Medical screening examination/treatment/procedure(s) were performed by non-physician practitioner and as supervising physician I was immediately available for consultation/collaboration.  Haevyn Ury M Elaijah Munoz, MD 11/12/12 0640 

## 2012-11-12 NOTE — Consult Note (Signed)
[facility] Face-to-Face Psychiatry Consult   Reason for Consult:  Evaluation for inpatient treatment Referring Physician:  EDP  John [name] is an 42 y.o. male.  Assessment: AXIS I:  Major Depressive Disorder without psychotic features AXIS II:  Deferred AXIS III:   Past Medical History  Diagnosis Date  . Asthma   . HIV (human immunodeficiency virus infection)     "dx'd ~ 2 yr ago" (09/29/2012)  . Mental disorder   . Anxiety   . Depression   . Chronic low back pain    AXIS IV:  economic problems, housing problems, occupational problems, other psychosocial or environmental problems, problems related to social environment and problems with primary support group AXIS V:  51-60 moderate symptoms  Plan:  No evidence of imminent risk to self or others at present.   Patient does not meet criteria for psychiatric inpatient admission. Discussed crisis plan, support from social network, calling 911, coming to the Emergency Department, and calling Suicide Hotline.  Subjective:   John [name] is a 42 y.o. male.  HPI:  Patient states that he is here related to intoxication.  Patient states that he just had a good friend to die and started drinking to cope with it.  Patient states that he is not a everyday drinker.  Patient states that he has a long history of depression but goes to [facility] for outpatient services and has a appointment for medication management and refills.  Patient states that he is compliant with his medications.  Patient also states that he is homeless and has no family.  Patient is upset related to the loss of his friend but states that he knows that alcohol can't solve the problem.  Patient denies suicidal ideation, homicidal ideation, psychosis and paranoia.  Patient states that he does not need detox and is showing no signs/symptoms of withdrawal.     Past Psychiatric History: Past Medical History  Diagnosis Date  . Asthma   . HIV (human immunodeficiency virus infection)      "dx'd ~ 2 yr ago" (09/29/2012)  . Mental disorder   . Anxiety   . Depression   . Chronic low back pain     reports that he has been smoking Cigarettes.  He has a 13.5 pack-year smoking history. He has never used smokeless tobacco. He reports that he drinks about 15.6 ounces of alcohol per week. He reports that he does not use illicit drugs. History reviewed. No pertinent family history. Family History Substance Abuse: No Family Supports: No Living Arrangements: Other relatives Can pt return to current living arrangement?: No Abuse/Neglect [facility]) Physical Abuse: Denies Verbal Abuse: Denies Sexual Abuse: Denies Allergies:   Allergies  Allergen Reactions  . Shellfish Allergy Anaphylaxis and Swelling    ACT Assessment Complete:  Yes:    Educational Status    Risk to Self: Risk to self Suicidal Ideation: No-Not Currently/Within Last 6 Months Suicidal Intent: No-Not Currently/Within Last 6 Months Is patient at risk for suicide?: No Suicidal Plan?: No-Not Currently/Within Last 6 Months Access to Means: No What has been your use of drugs/alcohol within the last 12 months?: alcohol Previous Attempts/Gestures: Yes How many times?: 3 Other Self Harm Risks: na Triggers for Past Attempts: None known Intentional Self Injurious Behavior: None Family Suicide History: No Recent stressful life event(s): Loss (Comment) (lost friend who died 11-28-2012) Persecutory voices/beliefs?: No Depression: Yes Depression Symptoms: Tearfulness;Fatigue;Isolating;Guilt;Loss of interest in usual pleasures;Feeling worthless/self pity;Feeling angry/irritable Substance abuse history and/or treatment for substance abuse?: Yes (ETOH- BAL  312    UDS negative) Suicide prevention information given to non-admitted patients: Yes  Risk to Others: Risk to Others Homicidal Ideation: No Thoughts of Harm to Others: No Current Homicidal Intent: No Current Homicidal Plan: No Access to Homicidal Means: No Identified  Victim: na History of harm to others?: No Assessment of Violence: None Noted Violent Behavior Description: cooperative Does patient have access to weapons?: No Criminal Charges Pending?: No (pt in jail 7-26;-10-23-12 trespassing/ assault) Does patient have a court date: No  Abuse: Abuse/Neglect Assessment (Assessment to be complete while patient is alone) Physical Abuse: Denies Verbal Abuse: Denies Sexual Abuse: Denies Exploitation of patient/patient's resources: Denies Self-Neglect: Denies  Prior Inpatient Therapy: Prior Inpatient Therapy Prior Inpatient Therapy: Yes Prior Therapy Dates: 2014 Prior Therapy Facilty/Provider(s): [facility] Reason for Treatment: depression, SA  Prior Outpatient Therapy: Prior Outpatient Therapy Prior Outpatient Therapy: Yes Prior Therapy Dates: current Prior Therapy Facilty/Provider(s): [facility] Reason for Treatment: med mgt  Additional Information: Additional Information 1:1 In Past 12 Months?: No CIRT Risk: No Elopement Risk: No Does patient have medical clearance?: Yes                  Objective: Blood pressure 117/76, pulse 78, temperature 97.6 F (36.4 C), temperature source Oral, resp. rate 17, SpO2 96.00%.There is no weight on file to calculate BMI. Results for orders placed during the hospital encounter of 11/11/12 (from the past 72 hour(s))  ACETAMINOPHEN LEVEL     Status: None   Collection Time    11/11/12 10:05 PM      Result Value Range   Acetaminophen (Tylenol), Serum <15.0  10 - 30 ug/mL   Comment:            THERAPEUTIC CONCENTRATIONS VARY     SIGNIFICANTLY. A RANGE OF 10-30     ug/mL MAY BE AN EFFECTIVE     CONCENTRATION FOR MANY PATIENTS.     HOWEVER, SOME ARE BEST TREATED     AT CONCENTRATIONS OUTSIDE THIS     RANGE.     ACETAMINOPHEN CONCENTRATIONS     >150 ug/mL AT 4 HOURS AFTER     INGESTION AND >50 ug/mL AT 12     HOURS AFTER INGESTION ARE     OFTEN ASSOCIATED WITH TOXIC     REACTIONS.  CBC     Status:  None   Collection Time    11/11/12 10:05 PM      Result Value Range   WBC 7.8  4.0 - 10.5 K/uL   RBC 4.28  4.22 - 5.81 MIL/uL   Hemoglobin 13.9  13.0 - 17.0 g/dL   HCT 40.3  47.4 - 25.9 %   MCV 92.3  78.0 - 100.0 fL   MCH 32.5  26.0 - 34.0 pg   MCHC 35.2  30.0 - 36.0 g/dL   RDW 56.3  87.5 - 64.3 %   Platelets 350  150 - 400 K/uL  COMPREHENSIVE METABOLIC PANEL     Status: None   Collection Time    11/11/12 10:05 PM      Result Value Range   Sodium 141  135 - 145 mEq/L   Potassium 3.7  3.5 - 5.1 mEq/L   Chloride 103  96 - 112 mEq/L   CO2 24  19 - 32 mEq/L   Glucose, Bld 89  70 - 99 mg/dL   BUN 16  6 - 23 mg/dL   Creatinine, Ser 3.29  0.50 - 1.35 mg/dL   Calcium 9.4  8.4 - 10.5 mg/dL   Total Protein 7.8  6.0 - 8.3 g/dL   Albumin 4.0  3.5 - 5.2 g/dL   AST 20  0 - 37 U/L   ALT 13  0 - 53 U/L   Alkaline Phosphatase 69  39 - 117 U/L   Total Bilirubin 0.7  0.3 - 1.2 mg/dL   GFR calc non Af Amer >90  >90 mL/min   GFR calc Af Amer >90  >90 mL/min   Comment: (NOTE)     The eGFR has been calculated using the CKD EPI equation.     This calculation has not been validated in all clinical situations.     eGFR's persistently <90 mL/min signify possible Chronic Kidney     Disease.  ETHANOL     Status: Abnormal   Collection Time    11/11/12 10:05 PM      Result Value Range   Alcohol, Ethyl (B) 312 (*) 0 - 11 mg/dL   Comment:            LOWEST DETECTABLE LIMIT FOR     SERUM ALCOHOL IS 11 mg/dL     FOR MEDICAL PURPOSES ONLY  SALICYLATE LEVEL     Status: Abnormal   Collection Time    11/11/12 10:05 PM      Result Value Range   Salicylate Lvl <2.0 (*) 2.8 - 20.0 mg/dL  URINE RAPID DRUG SCREEN (HOSP PERFORMED)     Status: None   Collection Time    11/11/12 10:10 PM      Result Value Range   Opiates NONE DETECTED  NONE DETECTED   Cocaine NONE DETECTED  NONE DETECTED   Benzodiazepines NONE DETECTED  NONE DETECTED   Amphetamines NONE DETECTED  NONE DETECTED   Tetrahydrocannabinol  NONE DETECTED  NONE DETECTED   Barbiturates NONE DETECTED  NONE DETECTED   Comment:            DRUG SCREEN FOR MEDICAL PURPOSES     ONLY.  IF CONFIRMATION IS NEEDED     FOR ANY PURPOSE, NOTIFY LAB     WITHIN 5 DAYS.                LOWEST DETECTABLE LIMITS     FOR URINE DRUG SCREEN     Drug Class       Cutoff (ng/mL)     Amphetamine      1000     Barbiturate      200     Benzodiazepine   200     Tricyclics       300     Opiates          300     Cocaine          300     THC              50  ETHANOL     Status: Abnormal   Collection Time    11/12/12  5:56 AM      Result Value Range   Alcohol, Ethyl (B) 156 (*) 0 - 11 mg/dL   Comment:            LOWEST DETECTABLE LIMIT FOR     SERUM ALCOHOL IS 11 mg/dL     FOR MEDICAL PURPOSES ONLY    Current Facility-Administered Medications  Medication Dose Route Frequency Provider Last Rate Last Dose  . albuterol (PROVENTIL HFA;VENTOLIN HFA) 108 (90 BASE) MCG/ACT inhaler 1-2 puff  1-2  puff Inhalation Q4H PRN Angus Seller, PA-C      . buPROPion [facility] SR) 12 hr tablet 150 mg  150 mg Oral BID Phill Mutter Dammen, PA-C   150 mg at 11/12/12 0900  . mometasone-formoterol (DULERA) 100-5 MCG/ACT inhaler 2 puff  2 puff Inhalation BID Phill Mutter Dammen, PA-C   2 puff at 11/12/12 0900  . risperiDONE (RISPERDAL) tablet 1.5 mg  1.5 mg Oral QHS Phill Mutter Dammen, PA-C   1.5 mg at 11/12/12 0544  . traZODone (DESYREL) tablet 100 mg  100 mg Oral QHS Angus Seller, PA-C       Current Outpatient Prescriptions  Medication Sig Dispense Refill  . albuterol (PROVENTIL HFA;VENTOLIN HFA) 108 (90 BASE) MCG/ACT inhaler Inhale 1-2 puffs into the lungs every 4 (four) hours as needed for wheezing or shortness of breath.  1 Inhaler  2  . buPROPion (WELLBUTRIN SR) 150 MG 12 hr tablet Take 1 tablet (150 mg total) by mouth 2 (two) times daily. 1 tablet by mouth day 1 - 3. Then 1 tablet by mouth twice a day  60 tablet  1  . Fluticasone-Salmeterol (ADVAIR DISKUS) 100-50 MCG/DOSE  AEPB Inhale 1 puff into the lungs once.  60 each  0  . Fluticasone-Salmeterol (ADVAIR) 100-50 MCG/DOSE AEPB Inhale 1 puff into the lungs 2 (two) times daily.      . risperiDONE (RISPERDAL) 0.5 MG tablet Take 3 tablets (1.5 mg total) by mouth at bedtime. For mood control  45 tablet  0  . traZODone (DESYREL) 100 MG tablet Take 100 mg by mouth at bedtime.        Psychiatric Specialty Exam:     Blood pressure 117/76, pulse 78, temperature 97.6 F (36.4 C), temperature source Oral, resp. rate 17, SpO2 96.00%.There is no weight on file to calculate BMI.  General Appearance: Disheveled  Eye Contact::  Good  Speech:  Clear and Coherent and Normal Rate  Volume:  Normal  Mood:  Depressed (sad)  Affect:  Depressed  Thought Process:  Coherent and Goal Directed  Orientation:  Full (Time, Place, and Person)  Thought Content:  WDL  Suicidal Thoughts:  No  Homicidal Thoughts:  No  Memory:  Immediate;   Good Recent;   Good Remote;   Good  Judgement:  Fair  Insight:  Good and Present  Psychomotor Activity:  Normal  Concentration:  Good  Recall:  Good  Akathisia:  No  Handed:  Right  AIMS (if indicated):     Assets:  Communication Skills Desire for Improvement Physical Health  Sleep:      Treatment Plan Summary: Disposition:    Discharge home to follow up with [facility].  Also suggested seeing a therapist which Vesta Mixer could help arrange.  List of resources (therapist) given.  Assunta Found, FNP-BC 11/12/2012 9:29 AM

## 2012-11-12 NOTE — Discharge Instructions (Signed)
Alcohol Problems  Most adults who drink alcohol drink in moderation (not a lot) are at low risk for developing problems related to their drinking. However, all drinkers, including low-risk drinkers, should know about the health risks connected with drinking alcohol.  RECOMMENDATIONS FOR LOW-RISK DRINKING   Drink in moderation. Moderate drinking is defined as follows:    Men - no more than 2 drinks per day.   Nonpregnant women - no more than 1 drink per day.   Over age 65 - no more than 1 drink per day.  A standard drink is 12 grams of pure alcohol, which is equal to a 12 ounce bottle of beer or wine cooler, a 5 ounce glass of wine, or 1.5 ounces of distilled spirits (such as whiskey, brandy, vodka, or rum).   ABSTAIN FROM (DO NOT DRINK) ALCOHOL:   When pregnant or considering pregnancy.   When taking a medication that interacts with alcohol.   If you are alcohol dependent.   A medical condition that prohibits drinking alcohol (such as ulcer, liver disease, or heart disease).  DISCUSS WITH YOUR CAREGIVER:   If you are at risk for coronary heart disease, discuss the potential benefits and risks of alcohol use: Light to moderate drinking is associated with lower rates of coronary heart disease in certain populations (for example, men over age 45 and postmenopausal women). Infrequent or nondrinkers are advised not to begin light to moderate drinking to reduce the risk of coronary heart disease so as to avoid creating an alcohol-related problem. Similar protective effects can likely be gained through proper diet and exercise.   Women and the elderly have smaller amounts of body water than men. As a result women and the elderly achieve a higher blood alcohol concentration after drinking the same amount of alcohol.   Exposing a fetus to alcohol can cause a broad range of birth defects referred to as Fetal Alcohol Syndrome (FAS) or Alcohol-Related Birth Defects (ARBD). Although FAS/ARBD is connected with excessive  alcohol consumption during pregnancy, studies also have reported neurobehavioral problems in infants born to mothers reporting drinking an average of 1 drink per day during pregnancy.   Heavier drinking (the consumption of more than 4 drinks per occasion by men and more than 3 drinks per occasion by women) impairs learning (cognitive) and psychomotor functions and increases the risk of alcohol-related problems, including accidents and injuries.  CAGE QUESTIONS:    Have you ever felt that you should Cut down on your drinking?   Have people Annoyed you by criticizing your drinking?   Have you ever felt bad or Guilty about your drinking?   Have you ever had a drink first thing in the morning to steady your nerves or get rid of a hangover (Eye opener)?  If you answered positively to any of these questions: You may be at risk for alcohol-related problems if alcohol consumption is:    Men: Greater than 14 drinks per week or more than 4 drinks per occasion.   Women: Greater than 7 drinks per week or more than 3 drinks per occasion.  Do you or your family have a medical history of alcohol-related problems, such as:   Blackouts.   Sexual dysfunction.   Depression.   Trauma.   Liver dysfunction.   Sleep disorders.   Hypertension.   Chronic abdominal pain.   Has your drinking ever caused you problems, such as problems with your family, problems with your work (or school) performance, or accidents/injuries?     Do you have a compulsion to drink or a preoccupation with drinking?   Do you have poor control or are you unable to stop drinking once you have started?   Do you have to drink to avoid withdrawal symptoms?   Do you have problems with withdrawal such as tremors, nausea, sweats, or mood disturbances?   Does it take more alcohol than in the past to get you high?   Do you feel a strong urge to drink?   Do you change your plans so that you can have a drink?   Do you ever drink in the morning to relieve  the shakes or a hangover?  If you have answered a number of the previous questions positively, it may be time for you to talk to your caregivers, family, and friends and see if they think you have a problem. Alcoholism is a chemical dependency that keeps getting worse and will eventually destroy your health and relationships. Many alcoholics end up dead, impoverished, or in prison. This is often the end result of all chemical dependency.   Do not be discouraged if you are not ready to take action immediately.   Decisions to change behavior often involve up and down desires to change and feeling like you cannot decide.   Try to think more seriously about your drinking behavior.   Think of the reasons to quit.  WHERE TO GO FOR ADDITIONAL INFORMATION    The National Institute on Alcohol Abuse and Alcoholism (NIAAA)  BasicStudents.dk   [employer] on Alcoholism and Drug Dependence (NCADD)  www.ncadd.org   American Society of Addiction Medicine (ASAM)  RoyalDiary.gl   Document Released: 03/01/2005 Document Revised: 05/24/2011 Document Reviewed: 10/18/2007  [facility] Patient Information 2014 [place], [place].  Depression, Adult  Depression refers to feeling sad, low, down in the dumps, blue, gloomy, or empty. In general, there are two kinds of depression:  1. Depression that we all experience from time to time because of upsetting life experiences, including the loss of a job or the ending of a relationship (normal sadness or normal grief). This kind of depression is considered normal, is short lived, and resolves within a few days to 2 weeks. (Depression experienced after the loss of a loved one is called bereavement. Bereavement often lasts longer than 2 weeks but normally gets better with time.)  2. Clinical depression, which lasts longer than normal sadness or normal grief or interferes with your ability to function at home, at work, and in school. It also interferes with your personal relationships. It  affects almost every aspect of your life. Clinical depression is an illness.  Symptoms of depression also can be caused by conditions other than normal sadness and grief or clinical depression. Examples of these conditions are listed as follows:   Physical illness Some physical illnesses, including underactive thyroid gland (hypothyroidism), severe anemia, specific types of cancer, diabetes, uncontrolled seizures, heart and lung problems, strokes, and chronic pain are commonly associated with symptoms of depression.   Side effects of some prescription medicine In some people, certain types of prescription medicine can cause symptoms of depression.   Substance abuse Abuse of alcohol and illicit drugs can cause symptoms of depression.  SYMPTOMS  Symptoms of normal sadness and normal grief include the following:   Feeling sad or crying for short periods of time.   Not caring about anything (apathy).   Difficulty sleeping or sleeping too much.   No longer able to enjoy the things you used to  enjoy.   Desire to be by oneself all the time (social isolation).   Lack of energy or motivation.   Difficulty concentrating or remembering.   Change in appetite or weight.   Restlessness or agitation.  Symptoms of clinical depression include the same symptoms of normal sadness or normal grief and also the following symptoms:   Feeling sad or crying all the time.   Feelings of guilt or worthlessness.   Feelings of hopelessness or helplessness.   Thoughts of suicide or the desire to harm yourself (suicidal ideation).   Loss of touch with reality (psychotic symptoms). Seeing or hearing things that are not real (hallucinations) or having false beliefs about your life or the people around you (delusions and paranoia).  DIAGNOSIS   The diagnosis of clinical depression usually is based on the severity and duration of the symptoms. Your caregiver also will ask you questions about your medical history and substance use to  find out if physical illness, use of prescription medicine, or substance abuse is causing your depression. Your caregiver also may order blood tests.  TREATMENT   Typically, normal sadness and normal grief do not require treatment. However, sometimes antidepressant medicine is prescribed for bereavement to ease the depressive symptoms until they resolve.  The treatment for clinical depression depends on the severity of your symptoms but typically includes antidepressant medicine, counseling with a mental health professional, or a combination of both. Your caregiver will help to determine what treatment is best for you.  Depression caused by physical illness usually goes away with appropriate medical treatment of the illness. If prescription medicine is causing depression, talk with your caregiver about stopping the medicine, decreasing the dose, or substituting another medicine.  Depression caused by abuse of alcohol or illicit drugs abuse goes away with abstinence from these substances. Some adults need professional help in order to stop drinking or using drugs.  SEEK IMMEDIATE CARE IF:   You have thoughts about hurting yourself or others.   You lose touch with reality (have psychotic symptoms).   You are taking medicine for depression and have a serious side effect.  FOR MORE INFORMATION  National Alliance on Mental Illness: www.nami.[employer]  National Institute of Mental Health: http://www.maynard.net/www.nimh.nih.gov  Document Released: 02/27/2000 Document Revised: 08/31/2011 Document Reviewed: 05/31/2011  [facility] Patient Information 2014 [place].

## 2012-11-12 NOTE — ED Provider Notes (Signed)
Pt has been seen by psychiatry rounding team.  They feel pt is safe to be discharged and can follow up with [facility] as an outpatient.    Gavin Pound. Walsie Smeltz, MD 11/12/12 1023

## 2012-11-23 ENCOUNTER — Telehealth: Payer: Self-pay | Admitting: *Deleted

## 2012-11-23 NOTE — Telephone Encounter (Signed)
Refill request was faxed to [facility] today

## 2012-11-24 ENCOUNTER — Emergency Department (HOSPITAL_COMMUNITY): Payer: Self-pay

## 2012-11-24 ENCOUNTER — Emergency Department (HOSPITAL_COMMUNITY)
Admission: EM | Admit: 2012-11-24 | Discharge: 2012-11-26 | Disposition: A | Discharge status: Home / Self Care | Payer: Self-pay | Attending: Emergency Medicine | Admitting: Emergency Medicine

## 2012-11-24 ENCOUNTER — Encounter (HOSPITAL_COMMUNITY): Payer: Self-pay | Admitting: Emergency Medicine

## 2012-11-24 ENCOUNTER — Emergency Department (HOSPITAL_COMMUNITY)
Admission: EM | Admit: 2012-11-24 | Discharge: 2012-11-24 | Disposition: A | Discharge status: Home / Self Care | Payer: Self-pay | Attending: Emergency Medicine | Admitting: Emergency Medicine

## 2012-11-24 DIAGNOSIS — F411 Generalized anxiety disorder: Secondary | ICD-10-CM | POA: Insufficient documentation

## 2012-11-24 DIAGNOSIS — F17200 Nicotine dependence, unspecified, uncomplicated: Secondary | ICD-10-CM | POA: Insufficient documentation

## 2012-11-24 DIAGNOSIS — Z5900 Homelessness unspecified: Secondary | ICD-10-CM | POA: Insufficient documentation

## 2012-11-24 DIAGNOSIS — J45909 Unspecified asthma, uncomplicated: Secondary | ICD-10-CM | POA: Insufficient documentation

## 2012-11-24 DIAGNOSIS — R062 Wheezing: Secondary | ICD-10-CM | POA: Insufficient documentation

## 2012-11-24 DIAGNOSIS — F323 Major depressive disorder, single episode, severe with psychotic features: Secondary | ICD-10-CM | POA: Insufficient documentation

## 2012-11-24 DIAGNOSIS — Z043 Encounter for examination and observation following other accident: Secondary | ICD-10-CM | POA: Insufficient documentation

## 2012-11-24 DIAGNOSIS — R0602 Shortness of breath: Secondary | ICD-10-CM | POA: Insufficient documentation

## 2012-11-24 DIAGNOSIS — Z21 Asymptomatic human immunodeficiency virus [HIV] infection status: Secondary | ICD-10-CM | POA: Insufficient documentation

## 2012-11-24 DIAGNOSIS — Z8739 Personal history of other diseases of the musculoskeletal system and connective tissue: Secondary | ICD-10-CM | POA: Insufficient documentation

## 2012-11-24 DIAGNOSIS — G8929 Other chronic pain: Secondary | ICD-10-CM | POA: Insufficient documentation

## 2012-11-24 DIAGNOSIS — Z79899 Other long term (current) drug therapy: Secondary | ICD-10-CM | POA: Insufficient documentation

## 2012-11-24 LAB — COMPREHENSIVE METABOLIC PANEL
ALT: 10 U/L (ref 0–53)
Alkaline Phosphatase: 60 U/L (ref 39–117)
BUN: 10 mg/dL (ref 6–23)
CO2: 21 mEq/L (ref 19–32)
Chloride: 109 mEq/L (ref 96–112)
GFR calc Af Amer: >90 mL/min (ref >90)
Glucose, Bld: 116 mg/dL — ABNORMAL HIGH (ref 70–99)
Potassium: 3.5 mEq/L (ref 3.5–5.1)
Sodium: 146 mEq/L — ABNORMAL HIGH (ref 135–145)
Total Bilirubin: 0.7 mg/dL (ref 0.3–1.2)

## 2012-11-24 LAB — CBC
HCT: 37.3 % — ABNORMAL LOW (ref 39.0–52.0)
Hemoglobin: 13.1 g/dL (ref 13.0–17.0)
RBC: 4.06 MIL/uL — ABNORMAL LOW (ref 4.22–5.81)
WBC: 5.3 10*3/uL (ref 4.0–10.5)

## 2012-11-24 LAB — ETHANOL: Alcohol, Ethyl (B): 209 mg/dL — ABNORMAL HIGH (ref 0–11)

## 2012-11-24 MED ORDER — ALBUTEROL SULFATE (5 MG/ML) 0.5% IN NEBU: 5.0000 mg | INHALATION_SOLUTION | Freq: Once | RESPIRATORY_TRACT | Status: DC

## 2012-11-24 MED ORDER — ALBUTEROL SULFATE (5 MG/ML) 0.5% IN NEBU
5.0000 mg | INHALATION_SOLUTION | Freq: Once | RESPIRATORY_TRACT | Status: AC
  Administered 2012-11-24: 5 mg via RESPIRATORY_TRACT
  Filled 2012-11-24: qty 1

## 2012-11-24 MED ORDER — ALBUTEROL SULFATE HFA 108 (90 BASE) MCG/ACT IN AERS
1.0000 | INHALATION_SPRAY | RESPIRATORY_TRACT | Status: DC | PRN
  Administered 2012-11-26: 2 via RESPIRATORY_TRACT
  Filled 2012-11-24: qty 6.7

## 2012-11-24 MED ORDER — RISPERIDONE 0.5 MG PO TABS
1.5000 mg | ORAL_TABLET | Freq: Every day | ORAL | Status: DC
  Administered 2012-11-24 – 2012-11-25 (×2): 1.5 mg via ORAL
  Filled 2012-11-24 (×2): qty 3

## 2012-11-24 MED ORDER — ACETAMINOPHEN 325 MG PO TABS: 650.0000 mg | ORAL_TABLET | ORAL | Status: DC | PRN

## 2012-11-24 MED ORDER — BUPROPION HCL ER (SR) 150 MG PO TB12
150.0000 mg | ORAL_TABLET | Freq: Two times a day (BID) | ORAL | Status: DC
  Administered 2012-11-25 – 2012-11-26 (×4): 150 mg via ORAL
  Filled 2012-11-24 (×5): qty 1

## 2012-11-24 MED ORDER — PREDNISONE 20 MG PO TABS
60.0000 mg | ORAL_TABLET | Freq: Once | ORAL | Status: AC
  Administered 2012-11-24: 60 mg via ORAL
  Filled 2012-11-24: qty 3

## 2012-11-24 MED ORDER — IBUPROFEN 400 MG PO TABS: 600.0000 mg | ORAL_TABLET | Freq: Three times a day (TID) | ORAL | Status: DC | PRN

## 2012-11-24 MED ORDER — TRAZODONE HCL 50 MG PO TABS
100.0000 mg | ORAL_TABLET | Freq: Every day | ORAL | Status: DC
  Administered 2012-11-24 – 2012-11-25 (×2): 100 mg via ORAL
  Filled 2012-11-24 (×2): qty 2

## 2012-11-24 MED ORDER — IPRATROPIUM BROMIDE 0.02 % IN SOLN
0.5000 mg | Freq: Once | RESPIRATORY_TRACT | Status: AC
  Administered 2012-11-24: 0.5 mg via RESPIRATORY_TRACT
  Filled 2012-11-24: qty 2.5

## 2012-11-24 MED ORDER — LORAZEPAM 1 MG PO TABS
1.0000 mg | ORAL_TABLET | Freq: Three times a day (TID) | ORAL | Status: DC | PRN
  Administered 2012-11-24: 1 mg via ORAL
  Filled 2012-11-24: qty 1

## 2012-11-24 NOTE — ED Notes (Signed)
Name called no answer X 3 
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
## 2012-11-24 NOTE — ED Notes (Signed)
Called for triage at second time

## 2012-11-24 NOTE — ED Notes (Signed)
Patient now expressing HI tendencies.  Patient states denies that he is SI.  Patient is having visual hallucinations and is agitated at this time.

## 2012-11-24 NOTE — ED Notes (Signed)
Pt jovial, joking, playful, loud, alert, NAD, calm, interactive, also jittery and shaky. Ativan given. lab pharm and registration finished at [facility], aware of need for urine sample, pending CXR. Pt watching TV and requesting something to eat.

## 2012-11-24 NOTE — ED Notes (Signed)
Pt transported to xray 
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
## 2012-11-24 NOTE — ED Notes (Addendum)
Med rec tech at [facility], bagged and labled belongings outside of room at nurses station. Pt denies pain or nausea, admits to feeling some anxiousness. Plan, process, urine and blood samples needed explained.

## 2012-11-24 NOTE — ED Notes (Signed)
Called for pt in waiting with no answer. 

## 2012-11-24 NOTE — ED Notes (Addendum)
Increased wob noted, neb initiated by RT. Pt alert, NAD, calm, also reports, "feel hot, like hot flashes", unsure of fever, no fever now or in triage.

## 2012-11-25 LAB — RAPID URINE DRUG SCREEN, HOSP PERFORMED
Barbiturates: NOT DETECTED
Benzodiazepines: NOT DETECTED

## 2012-11-25 NOTE — [facility] Counselor (Signed)
TP initially scheduled for 1130 but rescheduled with Verne Spurr PA for noon on 11-25-12.  Attempts to reach ED and notify of change were unsuccessful.  PA informed by TTS that pt was evaluated last night and was intoxicated.  Nurse requesting another opinion now that pt is sober and engaging in discussion about treatment.

## 2012-11-25 NOTE — [facility] Counselor (Signed)
Coordinated TP with [place] and PA Lloyd Huger.  Becky the nurse has Lloyd Huger number.  Machine at [employer] having issues.  Becky to contact [employer] and coordinate.

## 2012-11-25 NOTE — Progress Notes (Signed)
Involuntary paper work has been signed and sealed by [employer] but faxing to [employer] has been put on hold due to RN in the ED voicing the need for re-assessment of pt via TS while not being intoxicated.    Tomi Bamberger, MHT

## 2012-11-25 NOTE — Consult Note (Signed)
Reviewed note, agree with findings and plan.  Jacqulyn Cane, M.D.  11/25/2012 10:42 PM
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
## 2012-11-25 NOTE — Progress Notes (Signed)
IVC paper work has been faxed to and received by [employer] office due to the fax machine being down in the magistrate's office.  Tomi Bamberger, MHT

## 2012-11-25 NOTE — ED Notes (Signed)
Pt sleeping. NO sitter at [facility]. Pt in camera room and RN monitoring camera

## 2012-11-25 NOTE — ED Notes (Signed)
John [name], [facility] recommended that the pt be hospitalized and they will be currently searching for an appropriate placement for the pt.  Dr. Arnoldo Morale has been notified of this.

## 2012-11-25 NOTE — Progress Notes (Signed)
Per patient's nurse Kriste Basque) the magistrate requested that IVC paper work did not have enough information from physician for petition and needed to be re-submitted in order for magistrate to sign.  Paper work has been redone at this time, awaiting the ED physician to sign so it can be faxed to the magistrate.  Tomi Bamberger, MHT

## 2012-11-25 NOTE — ED Notes (Signed)
Speaking  To TTS at this time.
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
## 2012-11-25 NOTE — BH Assessment (Signed)
[facility] Assessment Progress Note Received a call from [place] [facility] @ [phone] stating pt accepted to Dr. Robet Leu and could be transported once made IVC.  Called Shelly Rubenstein who is completing pt disposition at [facility] to inform her.  Nurse to nurse report number to call is [phone].

## 2012-11-25 NOTE — Progress Notes (Signed)
Involuntary paper work completed by MD, awaiting notary to seal so that it can be faxed to the magistrate.  Tomi Bamberger, MHT

## 2012-11-25 NOTE — BH Assessment (Signed)
Faxed clinical information to: [facility]  The following facilities are currently at capacity: [facility]  Fallon Howerter Ellis Tyquisha Sharps Jr, LPC, [facility] Triage Specialist  

## 2012-11-26 NOTE — ED Notes (Signed)
Pt urinating in sink. Explained that staff use sinks for washing hands. RN reinforced Pt knowledge of bathroom location down hall.

## 2012-11-26 NOTE — ED Provider Notes (Signed)
Patient is awaiting transport to [facility] for psychiatric treatment. The patient is stable this morning.  Vital signs are normal.  Celene Kras, MD 11/26/12 [phone]

## 2012-11-26 NOTE — ED Notes (Addendum)
Sheriffs office called and msg left on sergeants answering machine regarding IVC patient transport to [facility]

## 2012-11-26 NOTE — ED Notes (Signed)
Pt Status Update given to Annice Pih at [facility]

## 2012-11-26 NOTE — ED Notes (Signed)
sherriff has called and will pick up pt at 10 am to take to [facility]

## 2012-11-27 ENCOUNTER — Ambulatory Visit: Payer: Self-pay | Admitting: Internal Medicine

## 2012-12-04 ENCOUNTER — Telehealth: Payer: Self-pay | Admitting: *Deleted

## 2012-12-04 NOTE — Telephone Encounter (Signed)
Faxed corrected application back to [employer] today.

## 2012-12-13 ENCOUNTER — Ambulatory Visit (INDEPENDENT_AMBULATORY_CARE_PROVIDER_SITE_OTHER): Payer: Self-pay | Admitting: *Deleted

## 2012-12-13 VITALS — BP 143/95 | HR 71 | Temp 98.3°F | Resp 18 | Wt 180.0 lb

## 2012-12-13 DIAGNOSIS — B20 Human immunodeficiency virus [HIV] disease: Secondary | ICD-10-CM

## 2012-12-13 DIAGNOSIS — Z21 Asymptomatic human immunodeficiency virus [HIV] infection status: Secondary | ICD-10-CM

## 2012-12-13 LAB — CD4/CD8 (T-HELPER/T-SUPPRESSOR CELL)
CD4%: 24.8
CD4: 570
CD8 % Suppressor T Cell: 58.8
CD8: 1352

## 2012-12-13 NOTE — Progress Notes (Signed)
Pt here for START, 28 month. He denies any new problems, signs, or symptoms. Fasting labs were drawn and flu vaccine administered. He received $20 gift card for visit. Next appt scheduled for April 18, 2013 @ 9:15am. Tacey Heap RN

## 2013-01-01 ENCOUNTER — Encounter: Payer: Self-pay | Admitting: Internal Medicine

## 2013-01-24 ENCOUNTER — Emergency Department (HOSPITAL_COMMUNITY)
Admission: EM | Admit: 2013-01-24 | Discharge: 2013-01-25 | Disposition: A | Discharge status: Home / Self Care | Payer: Self-pay | Attending: Emergency Medicine | Admitting: Emergency Medicine

## 2013-01-24 ENCOUNTER — Emergency Department (HOSPITAL_COMMUNITY): Payer: Self-pay

## 2013-01-24 ENCOUNTER — Encounter (HOSPITAL_COMMUNITY): Payer: Self-pay | Admitting: Emergency Medicine

## 2013-01-24 DIAGNOSIS — Z21 Asymptomatic human immunodeficiency virus [HIV] infection status: Secondary | ICD-10-CM | POA: Insufficient documentation

## 2013-01-24 DIAGNOSIS — M25579 Pain in unspecified ankle and joints of unspecified foot: Secondary | ICD-10-CM | POA: Insufficient documentation

## 2013-01-24 DIAGNOSIS — J45909 Unspecified asthma, uncomplicated: Secondary | ICD-10-CM | POA: Insufficient documentation

## 2013-01-24 DIAGNOSIS — F329 Major depressive disorder, single episode, unspecified: Secondary | ICD-10-CM | POA: Insufficient documentation

## 2013-01-24 DIAGNOSIS — F10929 Alcohol use, unspecified with intoxication, unspecified: Secondary | ICD-10-CM

## 2013-01-24 DIAGNOSIS — Z79899 Other long term (current) drug therapy: Secondary | ICD-10-CM | POA: Insufficient documentation

## 2013-01-24 DIAGNOSIS — G8929 Other chronic pain: Secondary | ICD-10-CM | POA: Insufficient documentation

## 2013-01-24 DIAGNOSIS — F411 Generalized anxiety disorder: Secondary | ICD-10-CM | POA: Insufficient documentation

## 2013-01-24 DIAGNOSIS — F1010 Alcohol abuse, uncomplicated: Secondary | ICD-10-CM | POA: Insufficient documentation

## 2013-01-24 DIAGNOSIS — M25572 Pain in left ankle and joints of left foot: Secondary | ICD-10-CM

## 2013-01-24 DIAGNOSIS — F17200 Nicotine dependence, unspecified, uncomplicated: Secondary | ICD-10-CM | POA: Insufficient documentation

## 2013-01-24 DIAGNOSIS — F3289 Other specified depressive episodes: Secondary | ICD-10-CM | POA: Insufficient documentation
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
## 2013-01-30 ENCOUNTER — Ambulatory Visit: Payer: Self-pay | Admitting: Internal Medicine

## 2013-01-30 ENCOUNTER — Telehealth: Payer: Self-pay | Admitting: *Deleted

## 2013-01-30 NOTE — Telephone Encounter (Signed)
Unable to contact pt about "No Show" appt.  Phone does not accept incoming calls

## 2013-02-21 ENCOUNTER — Other Ambulatory Visit: Payer: Self-pay

## 2013-02-21 ENCOUNTER — Emergency Department (HOSPITAL_COMMUNITY): Payer: Self-pay

## 2013-02-21 ENCOUNTER — Emergency Department (HOSPITAL_COMMUNITY)
Admission: EM | Admit: 2013-02-21 | Discharge: 2013-02-21 | Disposition: A | Discharge status: Home / Self Care | Payer: Self-pay | Attending: Emergency Medicine | Admitting: Emergency Medicine

## 2013-02-21 ENCOUNTER — Encounter (HOSPITAL_COMMUNITY): Payer: Self-pay | Admitting: Emergency Medicine

## 2013-02-21 DIAGNOSIS — F319 Bipolar disorder, unspecified: Secondary | ICD-10-CM | POA: Insufficient documentation

## 2013-02-21 DIAGNOSIS — F17200 Nicotine dependence, unspecified, uncomplicated: Secondary | ICD-10-CM | POA: Insufficient documentation

## 2013-02-21 DIAGNOSIS — R61 Generalized hyperhidrosis: Secondary | ICD-10-CM | POA: Insufficient documentation

## 2013-02-21 DIAGNOSIS — G8929 Other chronic pain: Secondary | ICD-10-CM | POA: Insufficient documentation

## 2013-02-21 DIAGNOSIS — Z79899 Other long term (current) drug therapy: Secondary | ICD-10-CM | POA: Insufficient documentation

## 2013-02-21 DIAGNOSIS — J45901 Unspecified asthma with (acute) exacerbation: Secondary | ICD-10-CM | POA: Insufficient documentation

## 2013-02-21 DIAGNOSIS — Z21 Asymptomatic human immunodeficiency virus [HIV] infection status: Secondary | ICD-10-CM | POA: Insufficient documentation

## 2013-02-21 DIAGNOSIS — F411 Generalized anxiety disorder: Secondary | ICD-10-CM | POA: Insufficient documentation

## 2013-02-21 DIAGNOSIS — R000 Tachycardia, unspecified: Secondary | ICD-10-CM | POA: Insufficient documentation

## 2013-02-21 DIAGNOSIS — IMO0002 Reserved for concepts with insufficient information to code with codable children: Secondary | ICD-10-CM | POA: Insufficient documentation

## 2013-02-21 MED ORDER — ALBUTEROL SULFATE HFA 108 (90 BASE) MCG/ACT IN AERS: 2.0000 | INHALATION_SPRAY | Freq: Four times a day (QID) | RESPIRATORY_TRACT | Status: DC | PRN

## 2013-02-21 MED ORDER — BENZONATATE 100 MG PO CAPS
200.0000 mg | ORAL_CAPSULE | Freq: Two times a day (BID) | ORAL | Status: DC | PRN
  Administered 2013-02-21: 200 mg via ORAL
  Filled 2013-02-21: qty 2

## 2013-02-21 MED ORDER — DEXAMETHASONE SODIUM PHOSPHATE 10 MG/ML IJ SOLN
10.0000 mg | Freq: Once | INTRAMUSCULAR | Status: AC
  Administered 2013-02-21: 10 mg via INTRAMUSCULAR
  Filled 2013-02-21: qty 1

## 2013-02-21 MED ORDER — ALBUTEROL SULFATE HFA 108 (90 BASE) MCG/ACT IN AERS
1.0000 | INHALATION_SPRAY | Freq: Four times a day (QID) | RESPIRATORY_TRACT | Status: DC | PRN
  Administered 2013-02-21: 1 via RESPIRATORY_TRACT
  Filled 2013-02-21: qty 6.7
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
## 2013-02-21 NOTE — ED Provider Notes (Signed)
I saw and evaluated the patient, reviewed the resident's note and I agree with the findings and plan.   .Face to face Exam:  General:  Awake HEENT:  Atraumatic Resp:  Scattered wheezes Abd:  Nondistended Neuro:No focal weakness  Nelia Shi, MD 02/21/13 1013

## 2013-03-03 ENCOUNTER — Emergency Department (HOSPITAL_COMMUNITY)
Admission: EM | Admit: 2013-03-03 | Discharge: 2013-03-03 | Disposition: A | Discharge status: Home / Self Care | Payer: Self-pay | Attending: Emergency Medicine | Admitting: Emergency Medicine

## 2013-03-03 ENCOUNTER — Encounter (HOSPITAL_COMMUNITY): Payer: Self-pay | Admitting: Emergency Medicine

## 2013-03-03 DIAGNOSIS — Z79899 Other long term (current) drug therapy: Secondary | ICD-10-CM | POA: Insufficient documentation

## 2013-03-03 DIAGNOSIS — F17200 Nicotine dependence, unspecified, uncomplicated: Secondary | ICD-10-CM | POA: Insufficient documentation

## 2013-03-03 DIAGNOSIS — Z8659 Personal history of other mental and behavioral disorders: Secondary | ICD-10-CM | POA: Insufficient documentation

## 2013-03-03 DIAGNOSIS — Z21 Asymptomatic human immunodeficiency virus [HIV] infection status: Secondary | ICD-10-CM | POA: Insufficient documentation

## 2013-03-03 DIAGNOSIS — R000 Tachycardia, unspecified: Secondary | ICD-10-CM | POA: Insufficient documentation

## 2013-03-03 DIAGNOSIS — J45901 Unspecified asthma with (acute) exacerbation: Secondary | ICD-10-CM | POA: Insufficient documentation

## 2013-03-03 DIAGNOSIS — G8929 Other chronic pain: Secondary | ICD-10-CM | POA: Insufficient documentation

## 2013-03-03 MED ORDER — AZITHROMYCIN 250 MG PO TABS
500.0000 mg | ORAL_TABLET | Freq: Once | ORAL | Status: AC
  Administered 2013-03-03: 500 mg via ORAL
  Filled 2013-03-03: qty 2

## 2013-03-03 MED ORDER — AZITHROMYCIN 250 MG PO TABS: ORAL_TABLET | ORAL | Status: DC

## 2013-03-03 MED ORDER — ALBUTEROL SULFATE HFA 108 (90 BASE) MCG/ACT IN AERS
2.0000 | INHALATION_SPRAY | Freq: Once | RESPIRATORY_TRACT | Status: AC
  Administered 2013-03-03: 2 via RESPIRATORY_TRACT
  Filled 2013-03-03: qty 6.7

## 2013-03-03 MED ORDER — PREDNISONE 10 MG PO TABS: 20.0000 mg | ORAL_TABLET | Freq: Every day | ORAL | Status: DC

## 2013-03-03 MED ORDER — ALBUTEROL SULFATE HFA 108 (90 BASE) MCG/ACT IN AERS: 2.0000 | INHALATION_SPRAY | RESPIRATORY_TRACT | Status: DC | PRN

## 2013-03-03 MED ORDER — IPRATROPIUM BROMIDE 0.02 % IN SOLN
0.5000 mg | Freq: Once | RESPIRATORY_TRACT | Status: AC
  Administered 2013-03-03: 0.5 mg via RESPIRATORY_TRACT
  Filled 2013-03-03: qty 2.5

## 2013-03-03 MED ORDER — METHYLPREDNISOLONE SODIUM SUCC 125 MG IJ SOLR
125.0000 mg | Freq: Once | INTRAMUSCULAR | Status: AC
  Administered 2013-03-03: 125 mg via INTRAVENOUS
  Filled 2013-03-03: qty 2

## 2013-03-03 MED ORDER — ALBUTEROL SULFATE (5 MG/ML) 0.5% IN NEBU
2.5000 mg | INHALATION_SOLUTION | Freq: Once | RESPIRATORY_TRACT | Status: AC
  Administered 2013-03-03: 2.5 mg via RESPIRATORY_TRACT
  Filled 2013-03-03: qty 0.5

## 2013-03-03 NOTE — ED Provider Notes (Signed)
Medical screening examination/treatment/procedure(s) were performed by non-physician practitioner and as supervising physician I was immediately available for consultation/collaboration.  EKG Interpretation   None          Hargis Vandyne, MD 03/03/13 1542 

## 2013-03-03 NOTE — ED Provider Notes (Signed)
CSN: [id number]     Arrival date & time 03/03/13  1032 History   First MD Initiated Contact with Patient 03/03/13 1102     Chief Complaint  Patient presents with  . Shortness of Breath   (Consider location/radiation/quality/duration/timing/severity/associated sxs/prior Treatment) HPI Comments: Patient with history of asthma here with worsening shortness of breath, cough and congestion.  Patient with long history of asthma, out of his inhaler, reports cough with yellow/white thick sputum production.  Denies fever, chills, chest pain, nausea, vomiting.  Reports last steroid use was several months ago (though review of chart reveals likely 2 weeks ago).    Patient is a 42 y.o. male presenting with shortness of breath. The history is provided by the patient. No language interpreter was used.  Shortness of Breath Severity:  Moderate Onset quality:  Gradual Duration:  2 days Timing:  Constant Progression:  Worsening Chronicity:  Chronic Context: smoke exposure and URI   Context: not activity and not pollens   Relieved by:  Nothing Worsened by:  Activity and coughing Ineffective treatments:  None tried Associated symptoms: cough, sputum production and wheezing   Associated symptoms: no chest pain, no diaphoresis, no ear pain, no fever, no hemoptysis, no PND, no rash, no sore throat and no vomiting   Risk factors: tobacco use   Risk factors: no hx of PE/DVT     Past Medical History  Diagnosis Date  . Asthma   . HIV (human immunodeficiency virus infection)     "dx'd ~ 2 yr ago" (09/29/2012)  . Mental disorder   . Anxiety   . Depression   . Chronic low back pain    Past Surgical History  Procedure Laterality Date  . Skin graft full thickness leg Left ?    POSTERIOR LEFT LEG  AFTER DOG BITES   History reviewed. No pertinent family history. History  Substance Use Topics  . Smoking status: Current Every Day Smoker -- 0.50 packs/day for 27 years    Types: Cigarettes  . Smokeless  tobacco: Never Used  . Alcohol Use: 15.6 oz/week    26 Shots of liquor per week     Comment: 09/29/2012 "drink 1 1/2 fifths/ of vodka/wk"    Review of Systems  Constitutional: Negative for fever and diaphoresis.  HENT: Negative for ear pain and sore throat.   Respiratory: Positive for cough, sputum production, shortness of breath and wheezing. Negative for hemoptysis.   Cardiovascular: Negative for chest pain and PND.  Gastrointestinal: Negative for vomiting.  Skin: Negative for rash.  All other systems reviewed and are negative.    Allergies  Shellfish allergy  Home Medications   Current Outpatient Rx  Name  Route  Sig  Dispense  Refill  . albuterol (PROVENTIL HFA;VENTOLIN HFA) 108 (90 BASE) MCG/ACT inhaler   Inhalation   Inhale 1-2 puffs into the lungs every 4 (four) hours as needed for wheezing or shortness of breath.   1 Inhaler   2    BP 121/71  Pulse 81  Temp(Src) 98.5 F (36.9 C) (Oral)  Resp 20  Wt 180 lb (81.647 kg)  SpO2 96% Physical Exam  Nursing note and vitals reviewed. Constitutional: He is oriented to person, place, and time. He appears well-developed and well-nourished. No distress.  HENT:  Head: Atraumatic.  Right Ear: External ear normal.  Left Ear: External ear normal.  Nose: Nose normal.  Mouth/Throat: Oropharynx is clear and moist. No oropharyngeal exudate.  Moon facies  Eyes: Conjunctivae are normal. Pupils are  equal, round, and reactive to light. No scleral icterus.  Neck: Normal range of motion. Neck supple.  Cardiovascular: Normal heart sounds.  Exam reveals no gallop and no friction rub.   No murmur heard. tachycardia  Pulmonary/Chest: No stridor. No respiratory distress. He has wheezes. He has no rales. He exhibits no tenderness.  Mild tachypnea, upper lobe bilateral expiratory wheezing  Abdominal: Soft. Bowel sounds are normal. He exhibits no distension. There is no tenderness.  Musculoskeletal: Normal range of motion. He exhibits no  edema and no tenderness.  Lymphadenopathy:    He has no cervical adenopathy.  Neurological: He is alert and oriented to person, place, and time. He exhibits normal muscle tone. Coordination normal.  Skin: Skin is warm and dry. No rash noted. No erythema. No pallor.  Psychiatric: He has a normal mood and affect. His behavior is normal. Judgment and thought content normal.    ED Course  Procedures (including critical care time) Labs Review Labs Reviewed - No data to display Imaging Review No results found.  EKG Interpretation   None      2:47 PM Patient now without wheezing on examination - given an albuterol HFA here.  Oxygen saturations remain 97% on room air.  MDM  Asthma exacerbation Bronchitis  Patient here with exacerbation of asthma after running out of his inhaler.  He denies chest pain, with noted expiratory wheezing, better after breathing treatments.  Not hypoxic.    Izola Price Marisue Humble, PA-C 03/03/13 1449

## 2013-03-03 NOTE — ED Notes (Signed)
To Ed via [employer] [facility]-- from the [facility] -- with c/o asthma attack-- inhaler empty per patient. Received Albuterol 5mg  Treatment enroute. EMS stated was in tripod position with inspiratory and expiratory wheezes througout. On arrival-- resp easier- expiratory wheezes remain.

## 2013-04-18 ENCOUNTER — Ambulatory Visit (INDEPENDENT_AMBULATORY_CARE_PROVIDER_SITE_OTHER): Payer: Self-pay | Admitting: *Deleted

## 2013-04-18 VITALS — BP 130/84 | HR 72 | Temp 98.0°F | Resp 20 | Wt 189.8 lb

## 2013-04-18 DIAGNOSIS — Z21 Asymptomatic human immunodeficiency virus [HIV] infection status: Secondary | ICD-10-CM

## 2013-04-18 DIAGNOSIS — B20 Human immunodeficiency virus [HIV] disease: Secondary | ICD-10-CM

## 2013-04-18 NOTE — Progress Notes (Signed)
Patient here for month 32 START study appt. He appears slightly SOB. He says his asthma has been bothering him past 3 days, feels winded, chest tightness, prod. cough and wheezing. Does not have any inhalers.  I recommended he stay and see one of our physicians later but he refused. He does make frequent trips to the ED for the same problem. Both of his hands were swollen and tender. I asked him if he had been fighting and he admitted to it. Denies any other problems at this time. He will return in June for the next study appt,.

## 2013-04-19 LAB — HIV-1 RNA QUANT-NO REFLEX-BLD
HIV 1 RNA Quant: 5668 copies/mL — ABNORMAL HIGH (ref <20)
HIV-1 RNA QUANT, LOG: 3.75 {Log} — AB (ref <1.30)

## 2013-04-21 ENCOUNTER — Encounter (HOSPITAL_COMMUNITY): Payer: Self-pay | Admitting: Emergency Medicine

## 2013-04-21 ENCOUNTER — Emergency Department (HOSPITAL_COMMUNITY): Payer: Self-pay

## 2013-04-21 ENCOUNTER — Emergency Department (HOSPITAL_COMMUNITY)
Admission: EM | Admit: 2013-04-21 | Discharge: 2013-04-21 | Disposition: A | Discharge status: Home / Self Care | Payer: Self-pay | Attending: Emergency Medicine | Admitting: Emergency Medicine

## 2013-04-21 DIAGNOSIS — M545 Low back pain, unspecified: Secondary | ICD-10-CM | POA: Insufficient documentation

## 2013-04-21 DIAGNOSIS — F489 Nonpsychotic mental disorder, unspecified: Secondary | ICD-10-CM | POA: Insufficient documentation

## 2013-04-21 DIAGNOSIS — G8929 Other chronic pain: Secondary | ICD-10-CM | POA: Insufficient documentation

## 2013-04-21 DIAGNOSIS — F17200 Nicotine dependence, unspecified, uncomplicated: Secondary | ICD-10-CM | POA: Insufficient documentation

## 2013-04-21 DIAGNOSIS — F329 Major depressive disorder, single episode, unspecified: Secondary | ICD-10-CM | POA: Insufficient documentation

## 2013-04-21 DIAGNOSIS — J45901 Unspecified asthma with (acute) exacerbation: Secondary | ICD-10-CM | POA: Insufficient documentation

## 2013-04-21 DIAGNOSIS — Z21 Asymptomatic human immunodeficiency virus [HIV] infection status: Secondary | ICD-10-CM | POA: Insufficient documentation

## 2013-04-21 DIAGNOSIS — R062 Wheezing: Secondary | ICD-10-CM | POA: Insufficient documentation

## 2013-04-21 DIAGNOSIS — F3289 Other specified depressive episodes: Secondary | ICD-10-CM | POA: Insufficient documentation

## 2013-04-21 DIAGNOSIS — F411 Generalized anxiety disorder: Secondary | ICD-10-CM | POA: Insufficient documentation

## 2013-04-21 LAB — POCT I-STAT TROPONIN I: TROPONIN I, POC: 0.01 ng/mL (ref 0.00–0.08)

## 2013-04-21 MED ORDER — PREDNISONE 20 MG PO TABS: 40.0000 mg | ORAL_TABLET | Freq: Every day | ORAL | Status: DC

## 2013-04-21 MED ORDER — PREDNISONE 20 MG PO TABS
40.0000 mg | ORAL_TABLET | Freq: Once | ORAL | Status: AC
  Administered 2013-04-21: 40 mg via ORAL
  Filled 2013-04-21: qty 2

## 2013-04-21 MED ORDER — ALBUTEROL (5 MG/ML) CONTINUOUS INHALATION SOLN
10.0000 mg/h | INHALATION_SOLUTION | RESPIRATORY_TRACT | Status: AC
  Administered 2013-04-21: 10 mg/h via RESPIRATORY_TRACT
  Filled 2013-04-21: qty 20

## 2013-04-21 MED ORDER — ALBUTEROL SULFATE HFA 108 (90 BASE) MCG/ACT IN AERS
2.0000 | INHALATION_SPRAY | RESPIRATORY_TRACT | Status: AC
  Administered 2013-04-21: 2 via RESPIRATORY_TRACT
  Filled 2013-04-21: qty 6.7

## 2013-04-21 NOTE — ED Notes (Addendum)
Picked up on side of road; severe asthma and ran out of inhaler for a few days.  Duration of sx. 6-8 hrs. Expiratory wheezing in all fields. sa02 100 ra. Total of 10 mg/0.5 mg of atrovent nebulizer. One word answers. Pt. States, "cough, cold congestion." doesn't want any needles.

## 2013-04-21 NOTE — ED Notes (Signed)
Lab called to confirm that blood work is not needed, confirmed with MD and informed lab that we do not need blood work.

## 2013-04-21 NOTE — ED Provider Notes (Signed)
CSN: [id number]     Arrival date & time 04/21/13  0348 History   First MD Initiated Contact with Patient 04/21/13 0403     Chief Complaint  Patient presents with  . Shortness of Breath   (Consider location/radiation/quality/duration/timing/severity/associated sxs/prior Treatment) HPI Comments: John [name] is a 43 year old male with a history of asthma, states that he gets flareups in the wintertime when the weather changes, has been out of his albuterol inhaler for at least one week over which time he states he has had a gradual onset of persistent shortness of breath which is gradually getting worse and associated with wheezing and a productive cough. He denies fevers, he was found to have an oxygen saturation of 100% on room air on their arrival though he was tight and wheezy and did require 2 treatments of albuterol by nebulizer in route. Steroids were not given prior to arrival. His last dose of steroids was approximately 4 months ago.  Patient is a 43 y.o. male presenting with shortness of breath. The history is provided by the patient and medical records.  Shortness of Breath   Past Medical History  Diagnosis Date  . Asthma   . HIV (human immunodeficiency virus infection)     "dx'd ~ 2 yr ago" (09/29/2012)  . Mental disorder   . Anxiety   . Depression   . Chronic low back pain    Past Surgical History  Procedure Laterality Date  . Skin graft full thickness leg Left ?    POSTERIOR LEFT LEG  AFTER DOG BITES   History reviewed. No pertinent family history. History  Substance Use Topics  . Smoking status: Current Every Day Smoker -- 0.50 packs/day for 27 years    Types: Cigarettes  . Smokeless tobacco: Never Used  . Alcohol Use: 15.6 oz/week    26 Shots of liquor per week     Comment: 09/29/2012 "drink 1 1/2 fifths/ of vodka/wk"    Review of Systems  Respiratory: Positive for shortness of breath.   All other systems reviewed and are negative.    Allergies  Shellfish  allergy  Home Medications   Current Outpatient Rx  Name  Route  Sig  Dispense  Refill  . albuterol (PROVENTIL HFA;VENTOLIN HFA) 108 (90 BASE) MCG/ACT inhaler   Inhalation   Inhale 2 puffs into the lungs every 4 (four) hours as needed for wheezing or shortness of breath.   1 Inhaler   2   . predniSONE (DELTASONE) 10 MG tablet   Oral   Take 10 mg by mouth 2 (two) times daily with a meal.         . predniSONE (DELTASONE) 20 MG tablet   Oral   Take 2 tablets (40 mg total) by mouth daily.   10 tablet   0    BP 120/67  Pulse 104  Resp 26  SpO2 94% Physical Exam  Nursing note and vitals reviewed. Constitutional: He appears well-developed and well-nourished. No distress.  HENT:  Head: Normocephalic and atraumatic.  Mouth/Throat: Oropharynx is clear and moist. No oropharyngeal exudate.  Eyes: Conjunctivae and EOM are normal. Pupils are equal, round, and reactive to light. Right eye exhibits no discharge. Left eye exhibits no discharge. No scleral icterus.  Neck: Normal range of motion. Neck supple. No JVD present. No thyromegaly present.  Cardiovascular: Normal rate, regular rhythm, normal heart sounds and intact distal pulses.  Exam reveals no gallop and no friction rub.   No murmur heard. Pulmonary/Chest: Effort normal.  No respiratory distress. He has wheezes. He has no rales.  Diffuse expiratory wheezing, diminished lung sounds in all fields, no respiratory distress, no tachypnea, no hypoxia, speaking in full sentences, no accessory muscle use  Abdominal: Soft. Bowel sounds are normal. He exhibits no distension and no mass. There is no tenderness.  Musculoskeletal: Normal range of motion. He exhibits no edema and no tenderness.  Lymphadenopathy:    He has no cervical adenopathy.  Neurological: He is alert. Coordination normal.  Skin: Skin is warm and dry. No rash noted. No erythema.  Psychiatric: He has a normal mood and affect. His behavior is normal.    ED Course   Procedures (including critical care time) Labs Review Labs Reviewed  POCT I-STAT TROPONIN I   Imaging Review Dg Chest 2 View (if Patient Has Fever And/or Copd)  04/21/2013   CLINICAL DATA:  43 year old male with shortness of Breath. Initial encounter.  EXAM: CHEST  2 VIEW  COMPARISON:  02/21/2013 and earlier.  FINDINGS: Stable lung volumes. Normal cardiac size and mediastinal contours. Visualized tracheal air column is within normal limits. No pneumothorax. No pleural effusion or pulmonary edema. Stable mild increased interstitial markings, including at the mid right lung. No acute pulmonary opacity. No acute osseous abnormality identified.  IMPRESSION: No acute cardiopulmonary abnormality.   Electronically Signed   By: Lars Pinks M.D.   On: 04/21/2013 06:00    EKG Interpretation    Date/Time:  Saturday April 21 2013 03:58:12 EST Ventricular Rate:  100 PR Interval:  158 QRS Duration: 89 QT Interval:  397 QTC Calculation: 512 R Axis:   9 Text Interpretation:  Sinus tachycardia Probable anteroseptal infarct, old Prolonged QT interval Probable RV involvement, suggest recording right precordial leads Abnormal ekg since last tracing no significant change Confirmed by Aviona Martenson  MD, Tayja Manzer ([id number]) on 04/21/2013 4:12:03 AM            MDM   1. Asthma exacerbation    The patient appears to have likely had a respiratory infection that has exacerbated his asthma. He is not hypoxic but does have diffuse wheezing in all lung fields. I will give him prednisone, continuous nebulizer treatment and obtain a chest x-ray. He does not appear septic, hopefully he will turn around and be able to be discharged. The patient is in agreement with the plan.    Improved after nebs and prednisone.  He has never been hypoxic, has no increased work of breathing.  Meds given in ED:  Medications  albuterol (PROVENTIL,VENTOLIN) solution continuous neb (10 mg/hr Nebulization New Bag/Given 04/21/13 0415)  albuterol  (PROVENTIL HFA;VENTOLIN HFA) 108 (90 BASE) MCG/ACT inhaler 2 puff (not administered)  predniSONE (DELTASONE) tablet 40 mg (40 mg Oral Given 04/21/13 0502)    New Prescriptions   PREDNISONE (DELTASONE) 20 MG TABLET    Take 2 tablets (40 mg total) by mouth daily.        John Acosta, MD 04/21/13 [phone]

## 2013-04-21 NOTE — Discharge Instructions (Signed)
Your xray is normal - no signs of pneumonia -  Use the inhaler 2 puffs every 4 hours for shortness of breath and wheezing (for 24 hours), then use the inhaler 2 puffs every 4 hours as needed after that.  Take prednisone 40mg  a day, once daily for 5 days.  Please call your doctor for a followup appointment within 24-48 hours. When you talk to your doctor please let them know that you were seen in the emergency department and have them acquire all of your records so that they can discuss the findings with you and formulate a treatment plan to fully care for your new and ongoing problems.   Emergency Department Resource Guide 1) Find a Doctor and Pay Out of Pocket Although you won't have to find out who is covered by your insurance plan, it is a good idea to ask around and get recommendations. You will then need to call the office and see if the doctor you have chosen will accept you as a new patient and what types of options they offer for patients who are self-pay. Some doctors offer discounts or will set up payment plans for their patients who do not have insurance, but you will need to ask so you aren't surprised when you get to your appointment.  2) Contact Your Local Health Department Not all health departments have doctors that can see patients for sick visits, but many do, so it is worth a call to see if yours does. If you don't know where your local health department is, you can check in your phone book. The CDC also has a tool to help you locate your state's health department, and many state websites also have listings of all of their local health departments.  3) Find a [facility] If your illness is not likely to be very severe or complicated, you may want to try a walk in clinic. These are popping up all over the country in pharmacies, drugstores, and shopping centers. They're usually staffed by nurse practitioners or physician assistants that have been trained to treat common illnesses  and complaints. They're usually fairly quick and inexpensive. However, if you have serious medical issues or chronic medical problems, these are probably not your best option.  No Primary Care Doctor: - Call Health Connect at  [phone] - they can help you locate a primary care doctor that  accepts your insurance, provides certain services, etc. - Physician Referral Service- [phone]  Chronic Pain Problems: Organization         Address  Phone   Notes  [facility]  [phone] Patients need to be referred by their primary care doctor.   Medication Assistance: Organization         Address  Phone   Notes  [facility] Medication [facility] [place] [place], [place] [address] [phone] --Must be a resident of [facility] -- Must have NO insurance coverage whatsoever (no Medicaid/ Medicare, etc.) -- The pt. MUST have a primary care doctor that directs their care regularly and follows them in the community   MedAssist  [phone]   [employer]  [phone]    Agencies that provide inexpensive medical care: Organization         Address  Phone   Notes  [place]  [phone]   [facility]    [phone]   [facility] [address]  [place], [place] [address] [phone]   [place] [address] [place] [phone]   Planned Parenthood    [phone]   [facility]    [phone]   [place] and [place] [address], [place] Phone:  [phone], Fax:  [phone] Hours of Operation:  9 am - 6 pm, M-F.  Also accepts Medicaid/Medicare and self-pay.  [facility] for [place] [place], [address], [place] Phone: [phone], Fax: [phone]. Hours of Operation:  8:30 am - 5:30 pm, M-F.  Also accepts Medicaid and self-pay.  [facility] [place] [address], [place] Phone: [phone]   [place], [place], [place] [phone], Ext. [phone] Mondays & Thursdays: 7-9 AM.  First 15 patients are seen on a first come, first serve basis.    [place] Providers:  Organization         Address  Phone   Notes  [facility] [address], [place] [phone] Also accepts self-pay patients.  [facility] [address], [place]  [phone]   [place], [address], [place] [phone]   [facility] [address] [place] [phone]   Lucianne Lei [address], [place]   [phone] Only accepts [place] Access [place] patients after they have their name applied to their card.   Self-Pay (no insurance) in [facility]:  Organization         Address  Phone   Notes  Sickle Cell Patients, [facility] [place] [phone]   [facility] [place] [phone]   [facility] [place]  [place], [place], [place] [phone]   [facility]/Dr. Osei-Bonsu  [address] [place] or [address], [place] [phone] Phone number for both [place] and [place] locations is the same.  Urgent Medical and [facility] [address], [place] [phone]   [facility] [address], [place] or [address] [phone]   [facility] [address], [place] [phone], phone; [phone], fax Sees patients 1st and 3rd Saturday of every month.  Must not qualify for public or private insurance (i.e. Medicaid, Medicare, [place] Health Choice, Veterans' Benefits)  Household income should be no more than 200% of the poverty level  The clinic cannot treat you if you are pregnant or think you are pregnant  Sexually transmitted diseases are not treated at the clinic.    Dental Care: Organization         Address  Phone  Notes  [facility] Department of [facility] [place] [phone] Accepts children up to age 55 who are enrolled in [place] or [place]; pregnant women with a Medicaid card; and children who have applied for Medicaid or [place] Health Choice, but were declined, whose parents can pay a reduced fee at time of service.  [facility] Department of [facility]  [address], [place] [phone] Accepts children up to age 66 who are enrolled in [place] or [place];  pregnant women with a Medicaid card; and children who have applied for Medicaid or [place] Health Choice, but were declined, whose parents can pay a reduced fee at time of service.  [facility] Adult [facility] Access PROGRAM  [place] [phone] Patients are seen by appointment only. Walk-ins are not accepted. [place] will see patients 6 years of age and older. Monday - Tuesday (8am-5pm) Most Wednesdays (8:30-5pm) $30 per visit, cash only  [facility] Adult Dental Access PROGRAM  [address], [facility] [phone] Patients are seen by appointment only. Walk-ins are not accepted. [place] will see patients 39 years of age and older. One Wednesday Evening (Monthly: Volunteer Based).  $30 per visit, cash only  [place]  [phone] for adults; Children under age 32, call Graduate Pediatric Dentistry at [phone]. Children aged 47-14, please call [phone] to request a pediatric application.  Dental services are provided in all areas of dental care including fillings, crowns and bridges, complete and partial dentures, implants, gum treatment, root canals, and extractions. Preventive care is  also provided. Treatment is provided to both adults and children. Patients are selected via a lottery and there is often a waiting list.   [facility] [address] [place]  [phone] [email or web address]   [facility] [address] [place], [place] [phone], Ext. [phone] Second and Fourth Thursday of each month, opens at 6:30 AM; Clinic ends at 9 AM.  Patients are seen on a first-come first-served basis, and a limited number are seen during each clinic.   [facility]  [address] Hillard Danker [place], [place] [phone]   Eligibility Requirements You must have lived in [place], [place], or [name] counties for at least the last three months.   You cannot be eligible for state or federal sponsored [employer], including [employer], [place], or [employer].   You generally cannot be eligible for healthcare insurance through your employer.    How to apply: Eligibility screenings are held every Tuesday and Wednesday afternoon from 1:00 pm until 4:00 pm. You do not need an appointment for the interview!  [facility] [address], [place], [place]   [place]  [place] Department  [place]  [phone]    Behavioral Health Resources in the Community: Intensive Outpatient Programs Organization         Address  Phone  Notes  Brusly Tarnov. [address], [place], [place] [phone]   [facility] [address], [place], [place]   ADS: [facility] Drug [facility] [address] [place], [place]   [place] [address],  [place], [place] or [phone]   Substance Abuse Resources Organization         Address  Phone  Notes  Alcohol and Drug Services  [phone]   [place]  [phone]   The [place]   Chinita Pester  [phone]   Residential & Outpatient Substance Abuse Program  [phone]   Psychological Services Organization         Address  Phone  Notes  [facility] [place]  [place]  [phone]   [place] [address] [place] or [phone]    Mobile  Crisis Teams Organization         Address  Phone  Notes  [facility], Mobile Crisis Care Unit  [phone]   [facility]  [address] [place], [place]   [facility] [address] [place] [phone]    Self-Help/Support Groups Organization         Address  Phone             Notes  [place]. of [place] - variety of support groups  [place] Call for more information  Narcotics Anonymous (NA), [facility] [address], [employer] [place]  2 meetings at this location   Special educational needs teacher         Address  Phone  Notes  [facility] [place],    [place]  [phone]   [facility]  [address] [place] [phone], [place], [place]   [place], [place] [phone] Admissions: 8am-3pm M-F  Incentives Substance [place] [address]    [place], [place] [phone]   [facility] [address] [place], [place], [place]   [facility] [place] [facility] [address]  [place], [place]   [facility] - Intensive Outpatient [address] Kristeen [address], [place], [place]   [facility] ([place].) [place].,  [place], [place] [phone] or [phone]   [facility] (RTS) [address], [place], [place] Accepts Medicaid  [facility] [place] [address],  [place] [phone]  Substance Abuse/Addiction Treatment   [facility] Organization         Address  Phone  Notes  [facility]  [phone]   Domenic Schwab, PhD [address] [place], [place]   [phone] or [phone]   [place], [place] [phone]   [facility] [address] [place], [place] [phone] Insurance/Medicaid/sponsorship through [facility] and Families [address], [address] [place]                                    [place], [place] [phone] [place] [address], [place] [phone]    Dr. Adele Schilder  [phone]   [facility] Dept. 1) [address] [name] 2) [place] 3)  [place] [address], [place] [phone]  [phone]   [place] [phone] or [phone] (After Hours)

## 2013-04-30 ENCOUNTER — Encounter: Payer: Self-pay | Admitting: Internal Medicine

## 2013-04-30 LAB — CD4/CD8 (T-HELPER/T-SUPPRESSOR CELL)
CD4%: 26.6
CD4: 559
CD8 % Suppressor T Cell: 58.2
CD8: 1222

## 2013-05-02 ENCOUNTER — Emergency Department (HOSPITAL_COMMUNITY): Payer: Self-pay

## 2013-05-02 ENCOUNTER — Encounter (HOSPITAL_COMMUNITY): Payer: Self-pay | Admitting: Emergency Medicine

## 2013-05-02 DIAGNOSIS — Z8659 Personal history of other mental and behavioral disorders: Secondary | ICD-10-CM | POA: Insufficient documentation

## 2013-05-02 DIAGNOSIS — Z21 Asymptomatic human immunodeficiency virus [HIV] infection status: Secondary | ICD-10-CM | POA: Insufficient documentation

## 2013-05-02 DIAGNOSIS — G8929 Other chronic pain: Secondary | ICD-10-CM | POA: Insufficient documentation

## 2013-05-02 DIAGNOSIS — F17200 Nicotine dependence, unspecified, uncomplicated: Secondary | ICD-10-CM | POA: Insufficient documentation

## 2013-05-02 DIAGNOSIS — Z79899 Other long term (current) drug therapy: Secondary | ICD-10-CM | POA: Insufficient documentation

## 2013-05-02 DIAGNOSIS — J45901 Unspecified asthma with (acute) exacerbation: Secondary | ICD-10-CM | POA: Insufficient documentation

## 2013-05-02 DIAGNOSIS — IMO0002 Reserved for concepts with insufficient information to code with codable children: Secondary | ICD-10-CM | POA: Insufficient documentation

## 2013-05-02 NOTE — ED Notes (Signed)
Patient yelling at this nurse.  Got up from the chair cursing at this nurse stating since he does not have insurance I was putting him on the back burner.  Told him I have no idea who has insurance and I am here to care for the patients.  Stated he was leaving the premises.

## 2013-05-02 NOTE — ED Notes (Signed)
Pt arrived via [facility] c/o asthma flare up. Inhaler not providing relief. EMS stats wheezing in lower bases. EMS VS 146/p, 104 HR. Prior to arrival EMS administered 5 mg albuterol.

## 2013-05-02 NOTE — ED Notes (Addendum)
Dr Randal Buba requesting repeat EKG, repeat done at 23:24 and signed by Dr Randal Buba and signed at 23:32

## 2013-05-03 ENCOUNTER — Emergency Department (HOSPITAL_COMMUNITY)
Admission: EM | Admit: 2013-05-03 | Discharge: 2013-05-03 | Discharge status: Against Medical Advice | Payer: Self-pay | Attending: Emergency Medicine | Admitting: Emergency Medicine

## 2013-05-03 DIAGNOSIS — J45909 Unspecified asthma, uncomplicated: Secondary | ICD-10-CM

## 2013-05-03 LAB — BASIC METABOLIC PANEL
BUN: 12 mg/dL (ref 6–23)
CHLORIDE: 101 meq/L (ref 96–112)
CO2: 18 mEq/L — ABNORMAL LOW (ref 19–32)
CREATININE: 0.91 mg/dL (ref 0.50–1.35)
Calcium: 9 mg/dL (ref 8.4–10.5)
GFR calc non Af Amer: >90 mL/min (ref >90)
Glucose, Bld: 143 mg/dL — ABNORMAL HIGH (ref 70–99)
Potassium: 3.6 mEq/L — ABNORMAL LOW (ref 3.7–5.3)
Sodium: 140 mEq/L (ref 137–147)

## 2013-05-03 LAB — CBC
HEMATOCRIT: 40.3 % (ref 39.0–52.0)
Hemoglobin: 14 g/dL (ref 13.0–17.0)
MCH: 32 pg (ref 26.0–34.0)
MCHC: 34.7 g/dL (ref 30.0–36.0)
MCV: 92.2 fL (ref 78.0–100.0)
PLATELETS: 301 10*3/uL (ref 150–400)
RBC: 4.37 MIL/uL (ref 4.22–5.81)
RDW: 13.4 % (ref 11.5–15.5)
WBC: 5.6 10*3/uL (ref 4.0–10.5)

## 2013-05-03 LAB — PRO B NATRIURETIC PEPTIDE: Pro B Natriuretic peptide (BNP): 60.7 pg/mL (ref 0–125)

## 2013-05-03 LAB — POCT I-STAT TROPONIN I: Troponin i, poc: 0 ng/mL (ref 0.00–0.08)

## 2013-05-03 MED ORDER — ALBUTEROL SULFATE (2.5 MG/3ML) 0.083% IN NEBU
5.0000 mg | INHALATION_SOLUTION | Freq: Once | RESPIRATORY_TRACT | Status: AC
  Administered 2013-05-03: 5 mg via RESPIRATORY_TRACT
  Filled 2013-05-03: qty 6

## 2013-05-03 MED ORDER — IPRATROPIUM BROMIDE 0.02 % IN SOLN
0.5000 mg | Freq: Once | RESPIRATORY_TRACT | Status: AC
  Administered 2013-05-03: 0.5 mg via RESPIRATORY_TRACT
  Filled 2013-05-03: qty 2.5

## 2013-05-03 MED ORDER — PREDNISONE 20 MG PO TABS
60.0000 mg | ORAL_TABLET | Freq: Once | ORAL | Status: AC
  Administered 2013-05-03: 60 mg via ORAL
  Filled 2013-05-03: qty 3

## 2013-05-03 NOTE — ED Notes (Signed)
Pt requesting [place] sandwich, this nurse told pt he cannot have anything to eat until after all tests are done

## 2013-05-03 NOTE — ED Notes (Signed)
Pt stated his asthma stated getting worse two days ago. Pt takes albuterol, with no relief of symptoms. Pt has audible wheezing in room. Pt states he feels SOB, chest tightness two days ago.

## 2013-05-03 NOTE — ED Provider Notes (Signed)
CSN: [id number]     Arrival date & time 05/02/13  2245 History   First MD Initiated Contact with Patient 05/03/13 0204     Chief Complaint  Patient presents with  . Shortness of Breath     (Consider location/radiation/quality/duration/timing/severity/associated sxs/prior Treatment) Patient is a 43 y.o. male presenting with wheezing. The history is provided by the patient.  Wheezing Severity:  Moderate Severity compared to prior episodes:  Similar Onset quality:  Gradual Timing:  Constant Progression:  Unchanged Chronicity:  Recurrent Context comment:  Cold Relieved by:  Nothing Worsened by:  Nothing tried Ineffective treatments:  None tried (out of inhaler) Associated symptoms: no chest pain   Risk factors: no smoke inhalation     Past Medical History  Diagnosis Date  . Asthma   . HIV (human immunodeficiency virus infection)     "dx'd ~ 2 yr ago" (09/29/2012)  . Mental disorder   . Anxiety   . Depression   . Chronic low back pain    Past Surgical History  Procedure Laterality Date  . Skin graft full thickness leg Left ?    POSTERIOR LEFT LEG  AFTER DOG BITES   No family history on file. History  Substance Use Topics  . Smoking status: Current Every Day Smoker -- 0.50 packs/day for 27 years    Types: Cigarettes  . Smokeless tobacco: Never Used  . Alcohol Use: 15.6 oz/week    26 Shots of liquor per week     Comment: 09/29/2012 "drink 1 1/2 fifths/ of vodka/wk"    Review of Systems  Respiratory: Positive for wheezing.   Cardiovascular: Negative for chest pain.  All other systems reviewed and are negative.      Allergies  Shellfish allergy  Home Medications   Current Outpatient Rx  Name  Route  Sig  Dispense  Refill  . albuterol (PROVENTIL HFA;VENTOLIN HFA) 108 (90 BASE) MCG/ACT inhaler   Inhalation   Inhale 2 puffs into the lungs every 4 (four) hours as needed for wheezing or shortness of breath.   1 Inhaler   2   . predniSONE (DELTASONE) 10 MG  tablet   Oral   Take 10 mg by mouth 2 (two) times daily with a meal.         . predniSONE (DELTASONE) 20 MG tablet   Oral   Take 2 tablets (40 mg total) by mouth daily.   10 tablet   0    BP 118/72  Pulse 95  Temp(Src) 97.8 F (36.6 C) (Oral)  Resp 19  SpO2 98% Physical Exam  Constitutional: He is oriented to person, place, and time. He appears well-developed and well-nourished. No distress.  HENT:  Head: Normocephalic and atraumatic.  Mouth/Throat: Oropharynx is clear and moist.  Eyes: Conjunctivae are normal. Pupils are equal, round, and reactive to light.  Neck: Normal range of motion. Neck supple.  Cardiovascular: Normal rate, regular rhythm and intact distal pulses.   Pulmonary/Chest: No stridor. He has wheezes. He has no rales.  Abdominal: Soft. Bowel sounds are normal. There is no tenderness. There is no rebound and no guarding.  Musculoskeletal: Normal range of motion.  Neurological: He is alert and oriented to person, place, and time.  Skin: Skin is warm and dry.  Psychiatric: He has a normal mood and affect.    ED Course  Procedures (including critical care time) Labs Review Labs Reviewed  PRO B NATRIURETIC PEPTIDE  CBC  BASIC METABOLIC PANEL  POCT I-STAT TROPONIN I  Imaging Review Dg Chest 2 View  05/02/2013   CLINICAL DATA:  Shortness of breath, wheezing.  EXAM: CHEST  2 VIEW  COMPARISON:  DG CHEST 2 VIEW dated 04/21/2013  FINDINGS: Hyperinflation of the lungs. Mild chronic peribronchial thickening. No confluent opacities or effusions. Heart is normal size. No pneumothorax. No acute bony abnormality.  IMPRESSION: Hyperinflation and chronic bronchitic changes.  No active disease.   Electronically Signed   By: Rolm Baptise M.D.   On: 05/02/2013 23:48    EKG Interpretation   None       MDM   Final diagnoses:  None   Left during the work up  Date: 05/03/2013  Rate: 106  Rhythm: sinus tachycardia  QRS Axis: normal  Intervals: normal  ST/T Wave  abnormalities: normal  Conduction Disutrbances:none  Narrative Interpretation:   Old EKG Reviewed: none available      Brucha Ahlquist K Pualani Borah-Rasch, MD 05/03/13 [phone]

## 2013-05-03 NOTE — ED Notes (Signed)
Pt very angry, requesting [place] sandwich. This nurse states no, he cannot have something to eat. Pt very angry, ripping himself off the monitor. This nurse attempted to explain to patient that he needs to stay to be treated. Pt ambulatory at discharge, swearing multiple times. Pt walked out with steady gait, no SOB noted.

## 2013-05-03 NOTE — ED Notes (Signed)
Pt again requesting sandwich, this nurse again states all test results have to come back, pt starting to get very angry, threatening to leave if he doesn't get his food.

## 2013-05-07 ENCOUNTER — Emergency Department (HOSPITAL_COMMUNITY)
Admission: EM | Admit: 2013-05-07 | Discharge: 2013-05-08 | Disposition: A | Discharge status: Other Acute Inpatient Hospital | Payer: Self-pay | Attending: Emergency Medicine | Admitting: Emergency Medicine

## 2013-05-07 ENCOUNTER — Encounter (HOSPITAL_COMMUNITY): Payer: Self-pay | Admitting: Emergency Medicine

## 2013-05-07 ENCOUNTER — Emergency Department (HOSPITAL_COMMUNITY): Payer: Self-pay

## 2013-05-07 DIAGNOSIS — Z79899 Other long term (current) drug therapy: Secondary | ICD-10-CM | POA: Insufficient documentation

## 2013-05-07 DIAGNOSIS — S6990XA Unspecified injury of unspecified wrist, hand and finger(s), initial encounter: Secondary | ICD-10-CM | POA: Insufficient documentation

## 2013-05-07 DIAGNOSIS — X838XXA Intentional self-harm by other specified means, initial encounter: Secondary | ICD-10-CM | POA: Insufficient documentation

## 2013-05-07 DIAGNOSIS — H53149 Visual discomfort, unspecified: Secondary | ICD-10-CM | POA: Insufficient documentation

## 2013-05-07 DIAGNOSIS — Z8659 Personal history of other mental and behavioral disorders: Secondary | ICD-10-CM | POA: Insufficient documentation

## 2013-05-07 DIAGNOSIS — F911 Conduct disorder, childhood-onset type: Secondary | ICD-10-CM | POA: Insufficient documentation

## 2013-05-07 DIAGNOSIS — F1010 Alcohol abuse, uncomplicated: Secondary | ICD-10-CM | POA: Insufficient documentation

## 2013-05-07 DIAGNOSIS — F17200 Nicotine dependence, unspecified, uncomplicated: Secondary | ICD-10-CM | POA: Insufficient documentation

## 2013-05-07 DIAGNOSIS — R443 Hallucinations, unspecified: Secondary | ICD-10-CM | POA: Insufficient documentation

## 2013-05-07 DIAGNOSIS — R45850 Homicidal ideations: Secondary | ICD-10-CM | POA: Insufficient documentation

## 2013-05-07 DIAGNOSIS — Z8739 Personal history of other diseases of the musculoskeletal system and connective tissue: Secondary | ICD-10-CM | POA: Insufficient documentation

## 2013-05-07 DIAGNOSIS — J45909 Unspecified asthma, uncomplicated: Secondary | ICD-10-CM | POA: Insufficient documentation

## 2013-05-07 DIAGNOSIS — IMO0002 Reserved for concepts with insufficient information to code with codable children: Secondary | ICD-10-CM | POA: Insufficient documentation

## 2013-05-07 DIAGNOSIS — Z21 Asymptomatic human immunodeficiency virus [HIV] infection status: Secondary | ICD-10-CM | POA: Insufficient documentation

## 2013-05-07 DIAGNOSIS — F4310 Post-traumatic stress disorder, unspecified: Secondary | ICD-10-CM | POA: Insufficient documentation

## 2013-05-07 LAB — RAPID URINE DRUG SCREEN, HOSP PERFORMED
Amphetamines: NOT DETECTED
BENZODIAZEPINES: NOT DETECTED
Barbiturates: NOT DETECTED
COCAINE: POSITIVE — AB
OPIATES: NOT DETECTED
Tetrahydrocannabinol: POSITIVE — AB

## 2013-05-07 LAB — CBC
HCT: 39.7 % (ref 39.0–52.0)
Hemoglobin: 13.7 g/dL (ref 13.0–17.0)
MCH: 31.3 pg (ref 26.0–34.0)
MCHC: 34.5 g/dL (ref 30.0–36.0)
MCV: 90.6 fL (ref 78.0–100.0)
PLATELETS: 306 10*3/uL (ref 150–400)
RBC: 4.38 MIL/uL (ref 4.22–5.81)
RDW: 12.9 % (ref 11.5–15.5)
WBC: 4.1 10*3/uL (ref 4.0–10.5)

## 2013-05-07 LAB — ETHANOL: Alcohol, Ethyl (B): 133 mg/dL — ABNORMAL HIGH (ref 0–11)

## 2013-05-07 MED ORDER — ZOLPIDEM TARTRATE 5 MG PO TABS
5.0000 mg | ORAL_TABLET | Freq: Every evening | ORAL | Status: DC | PRN
  Administered 2013-05-08: 5 mg via ORAL
  Filled 2013-05-07: qty 1

## 2013-05-07 MED ORDER — ALBUTEROL SULFATE HFA 108 (90 BASE) MCG/ACT IN AERS: 2.0000 | INHALATION_SPRAY | RESPIRATORY_TRACT | Status: DC | PRN

## 2013-05-07 MED ORDER — IBUPROFEN 200 MG PO TABS: 600.0000 mg | ORAL_TABLET | Freq: Three times a day (TID) | ORAL | Status: DC | PRN

## 2013-05-07 MED ORDER — ONDANSETRON HCL 4 MG PO TABS: 4.0000 mg | ORAL_TABLET | Freq: Three times a day (TID) | ORAL | Status: DC | PRN

## 2013-05-07 MED ORDER — LORAZEPAM 1 MG PO TABS
1.0000 mg | ORAL_TABLET | Freq: Three times a day (TID) | ORAL | Status: DC | PRN
  Administered 2013-05-08: 1 mg via ORAL
  Filled 2013-05-07: qty 1

## 2013-05-07 MED ORDER — NICOTINE 21 MG/24HR TD PT24
21.0000 mg | MEDICATED_PATCH | Freq: Every day | TRANSDERMAL | Status: DC
  Administered 2013-05-08: 21 mg via TRANSDERMAL
  Filled 2013-05-07: qty 1

## 2013-05-07 MED ORDER — FLUTICASONE PROPIONATE HFA 44 MCG/ACT IN AERO
1.0000 | INHALATION_SPRAY | Freq: Two times a day (BID) | RESPIRATORY_TRACT | Status: DC
  Administered 2013-05-08 (×2): 1 via RESPIRATORY_TRACT
  Filled 2013-05-07: qty 10.6
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
## 2013-05-07 NOTE — ED Notes (Signed)
[facility] [name] [facility] [phone]
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
## 2013-05-08 ENCOUNTER — Encounter (HOSPITAL_COMMUNITY): Payer: Self-pay

## 2013-05-08 ENCOUNTER — Inpatient Hospital Stay (HOSPITAL_COMMUNITY)
Admission: AD | Admit: 2013-05-08 | Discharge: 2013-05-16 | DRG: 897 | Disposition: A | Discharge status: Home / Self Care | Payer: Federal, State, Local not specified - Other | Source: Intra-hospital | Attending: Psychiatry | Admitting: Psychiatry

## 2013-05-08 DIAGNOSIS — F331 Major depressive disorder, recurrent, moderate: Secondary | ICD-10-CM

## 2013-05-08 DIAGNOSIS — F1210 Cannabis abuse, uncomplicated: Secondary | ICD-10-CM | POA: Diagnosis present

## 2013-05-08 DIAGNOSIS — F1410 Cocaine abuse, uncomplicated: Secondary | ICD-10-CM | POA: Diagnosis present

## 2013-05-08 DIAGNOSIS — F10988 Alcohol use, unspecified with other alcohol-induced disorder: Secondary | ICD-10-CM | POA: Diagnosis present

## 2013-05-08 DIAGNOSIS — J45909 Unspecified asthma, uncomplicated: Secondary | ICD-10-CM | POA: Diagnosis present

## 2013-05-08 DIAGNOSIS — F1020 Alcohol dependence, uncomplicated: Secondary | ICD-10-CM | POA: Diagnosis present

## 2013-05-08 DIAGNOSIS — F17210 Nicotine dependence, cigarettes, uncomplicated: Secondary | ICD-10-CM

## 2013-05-08 DIAGNOSIS — B20 Human immunodeficiency virus [HIV] disease: Secondary | ICD-10-CM

## 2013-05-08 DIAGNOSIS — F1994 Other psychoactive substance use, unspecified with psychoactive substance-induced mood disorder: Secondary | ICD-10-CM

## 2013-05-08 DIAGNOSIS — F10229 Alcohol dependence with intoxication, unspecified: Principal | ICD-10-CM | POA: Diagnosis present

## 2013-05-08 DIAGNOSIS — F1010 Alcohol abuse, uncomplicated: Secondary | ICD-10-CM

## 2013-05-08 DIAGNOSIS — F329 Major depressive disorder, single episode, unspecified: Secondary | ICD-10-CM | POA: Diagnosis present

## 2013-05-08 DIAGNOSIS — M545 Low back pain, unspecified: Secondary | ICD-10-CM | POA: Diagnosis present

## 2013-05-08 DIAGNOSIS — F4310 Post-traumatic stress disorder, unspecified: Secondary | ICD-10-CM

## 2013-05-08 DIAGNOSIS — F323 Major depressive disorder, single episode, severe with psychotic features: Secondary | ICD-10-CM

## 2013-05-08 DIAGNOSIS — F0630 Mood disorder due to known physiological condition, unspecified: Secondary | ICD-10-CM

## 2013-05-08 DIAGNOSIS — G4700 Insomnia, unspecified: Secondary | ICD-10-CM | POA: Diagnosis present

## 2013-05-08 DIAGNOSIS — Z21 Asymptomatic human immunodeficiency virus [HIV] infection status: Secondary | ICD-10-CM | POA: Diagnosis present

## 2013-05-08 LAB — COMPREHENSIVE METABOLIC PANEL
ALT: 18 U/L (ref 0–53)
AST: 26 U/L (ref 0–37)
Albumin: 4 g/dL (ref 3.5–5.2)
Alkaline Phosphatase: 81 U/L (ref 39–117)
BUN: 15 mg/dL (ref 6–23)
CALCIUM: 9.2 mg/dL (ref 8.4–10.5)
CO2: 21 meq/L (ref 19–32)
CREATININE: 0.91 mg/dL (ref 0.50–1.35)
Chloride: 98 mEq/L (ref 96–112)
GFR calc Af Amer: >90 mL/min (ref >90)
Glucose, Bld: 106 mg/dL — ABNORMAL HIGH (ref 70–99)
Potassium: 3.8 mEq/L (ref 3.7–5.3)
Sodium: 138 mEq/L (ref 137–147)
Total Bilirubin: 0.3 mg/dL (ref 0.3–1.2)
Total Protein: 7.9 g/dL (ref 6.0–8.3)

## 2013-05-08 MED ORDER — ONDANSETRON HCL 4 MG PO TABS: 4.0000 mg | ORAL_TABLET | Freq: Three times a day (TID) | ORAL | Status: DC | PRN

## 2013-05-08 MED ORDER — CHLORDIAZEPOXIDE HCL 25 MG PO CAPS
25.0000 mg | ORAL_CAPSULE | Freq: Four times a day (QID) | ORAL | Status: DC
  Administered 2013-05-08 (×2): 25 mg via ORAL
  Filled 2013-05-08 (×3): qty 1

## 2013-05-08 MED ORDER — HYDROXYZINE HCL 25 MG PO TABS: 25.0000 mg | ORAL_TABLET | Freq: Four times a day (QID) | ORAL | Status: DC | PRN

## 2013-05-08 MED ORDER — CHLORDIAZEPOXIDE HCL 25 MG PO CAPS
25.0000 mg | ORAL_CAPSULE | Freq: Three times a day (TID) | ORAL | Status: AC
  Administered 2013-05-10 (×3): 25 mg via ORAL
  Filled 2013-05-08 (×3): qty 1

## 2013-05-08 MED ORDER — LOPERAMIDE HCL 2 MG PO CAPS: 2.0000 mg | ORAL_CAPSULE | ORAL | Status: AC | PRN

## 2013-05-08 MED ORDER — ALBUTEROL SULFATE (2.5 MG/3ML) 0.083% IN NEBU: 2.5000 mg | INHALATION_SOLUTION | RESPIRATORY_TRACT | Status: DC | PRN

## 2013-05-08 MED ORDER — CHLORDIAZEPOXIDE HCL 25 MG PO CAPS
25.0000 mg | ORAL_CAPSULE | Freq: Four times a day (QID) | ORAL | Status: AC
  Administered 2013-05-08 – 2013-05-09 (×4): 25 mg via ORAL
  Filled 2013-05-08 (×4): qty 1

## 2013-05-08 MED ORDER — CHLORDIAZEPOXIDE HCL 25 MG PO CAPS
25.0000 mg | ORAL_CAPSULE | Freq: Every day | ORAL | Status: AC
  Administered 2013-05-12: 25 mg via ORAL
  Filled 2013-05-08: qty 1

## 2013-05-08 MED ORDER — THIAMINE HCL 100 MG/ML IJ SOLN: 100.0000 mg | Freq: Once | INTRAMUSCULAR | Status: DC

## 2013-05-08 MED ORDER — MAGNESIUM HYDROXIDE 400 MG/5ML PO SUSP: 30.0000 mL | Freq: Every day | ORAL | Status: DC | PRN

## 2013-05-08 MED ORDER — CHLORDIAZEPOXIDE HCL 25 MG PO CAPS: 25.0000 mg | ORAL_CAPSULE | Freq: Every day | ORAL | Status: DC

## 2013-05-08 MED ORDER — CHLORDIAZEPOXIDE HCL 25 MG PO CAPS: 25.0000 mg | ORAL_CAPSULE | Freq: Four times a day (QID) | ORAL | Status: DC | PRN

## 2013-05-08 MED ORDER — CHLORDIAZEPOXIDE HCL 25 MG PO CAPS
25.0000 mg | ORAL_CAPSULE | Freq: Once | ORAL | Status: AC
Start: 2013-05-08 — End: 2013-05-08
  Administered 2013-05-08: 50 mg via ORAL
  Filled 2013-05-08: qty 1

## 2013-05-08 MED ORDER — FLUTICASONE PROPIONATE HFA 44 MCG/ACT IN AERO
1.0000 | INHALATION_SPRAY | Freq: Two times a day (BID) | RESPIRATORY_TRACT | Status: DC
  Administered 2013-05-09 – 2013-05-15 (×14): 1 via RESPIRATORY_TRACT
  Filled 2013-05-08: qty 10.6

## 2013-05-08 MED ORDER — NICOTINE 21 MG/24HR TD PT24
21.0000 mg | MEDICATED_PATCH | Freq: Every day | TRANSDERMAL | Status: DC
  Administered 2013-05-09 – 2013-05-15 (×7): 21 mg via TRANSDERMAL
  Filled 2013-05-08 (×9): qty 1

## 2013-05-08 MED ORDER — ONDANSETRON 4 MG PO TBDP: 4.0000 mg | ORAL_TABLET | Freq: Four times a day (QID) | ORAL | Status: DC | PRN

## 2013-05-08 MED ORDER — THIAMINE HCL 100 MG/ML IJ SOLN
100.0000 mg | Freq: Once | INTRAMUSCULAR | Status: DC
  Filled 2013-05-08: qty 2

## 2013-05-08 MED ORDER — ACETAMINOPHEN 325 MG PO TABS: 650.0000 mg | ORAL_TABLET | Freq: Four times a day (QID) | ORAL | Status: DC | PRN

## 2013-05-08 MED ORDER — LOPERAMIDE HCL 2 MG PO CAPS: 2.0000 mg | ORAL_CAPSULE | ORAL | Status: DC | PRN

## 2013-05-08 MED ORDER — ONDANSETRON 4 MG PO TBDP: 4.0000 mg | ORAL_TABLET | Freq: Four times a day (QID) | ORAL | Status: AC | PRN

## 2013-05-08 MED ORDER — ACETAMINOPHEN 325 MG PO TABS
650.0000 mg | ORAL_TABLET | Freq: Four times a day (QID) | ORAL | Status: DC | PRN
  Administered 2013-05-09: 650 mg via ORAL
  Filled 2013-05-08: qty 2

## 2013-05-08 MED ORDER — CHLORDIAZEPOXIDE HCL 25 MG PO CAPS
25.0000 mg | ORAL_CAPSULE | ORAL | Status: AC
  Administered 2013-05-11: 25 mg via ORAL
  Filled 2013-05-08: qty 1

## 2013-05-08 MED ORDER — CHLORDIAZEPOXIDE HCL 25 MG PO CAPS: 25.0000 mg | ORAL_CAPSULE | Freq: Three times a day (TID) | ORAL | Status: DC

## 2013-05-08 MED ORDER — ADULT MULTIVITAMIN W/MINERALS CH
1.0000 | ORAL_TABLET | Freq: Every day | ORAL | Status: DC
  Administered 2013-05-09 – 2013-05-15 (×7): 1 via ORAL
  Filled 2013-05-08 (×10): qty 1

## 2013-05-08 MED ORDER — ALUM & MAG HYDROXIDE-SIMETH 200-200-20 MG/5ML PO SUSP
30.0000 mL | ORAL | Status: DC | PRN
  Administered 2013-05-12: 30 mL via ORAL

## 2013-05-08 MED ORDER — IBUPROFEN 600 MG PO TABS: 600.0000 mg | ORAL_TABLET | Freq: Three times a day (TID) | ORAL | Status: DC | PRN

## 2013-05-08 MED ORDER — ALUM & MAG HYDROXIDE-SIMETH 200-200-20 MG/5ML PO SUSP
30.0000 mL | ORAL | Status: DC | PRN
Start: 2013-05-08 — End: 2013-05-08

## 2013-05-08 MED ORDER — HYDROXYZINE HCL 25 MG PO TABS: 25.0000 mg | ORAL_TABLET | Freq: Four times a day (QID) | ORAL | Status: AC | PRN

## 2013-05-08 MED ORDER — ALBUTEROL SULFATE (2.5 MG/3ML) 0.083% IN NEBU
2.5000 mg | INHALATION_SOLUTION | RESPIRATORY_TRACT | Status: DC | PRN
  Filled 2013-05-08: qty 3

## 2013-05-08 MED ORDER — TRAZODONE HCL 50 MG PO TABS
50.0000 mg | ORAL_TABLET | Freq: Every evening | ORAL | Status: DC | PRN
  Administered 2013-05-08: 50 mg via ORAL
  Filled 2013-05-08 (×7): qty 1

## 2013-05-08 MED ORDER — VITAMIN B-1 100 MG PO TABS
100.0000 mg | ORAL_TABLET | Freq: Every day | ORAL | Status: DC
  Administered 2013-05-09 – 2013-05-15 (×7): 100 mg via ORAL
  Filled 2013-05-08 (×10): qty 1

## 2013-05-08 MED ORDER — ADULT MULTIVITAMIN W/MINERALS CH
1.0000 | ORAL_TABLET | Freq: Every day | ORAL | Status: DC
  Administered 2013-05-08: 1 via ORAL
  Filled 2013-05-08: qty 1

## 2013-05-08 MED ORDER — VITAMIN B-1 100 MG PO TABS: 100.0000 mg | ORAL_TABLET | Freq: Every day | ORAL | Status: DC

## 2013-05-08 MED ORDER — CHLORDIAZEPOXIDE HCL 25 MG PO CAPS: 25.0000 mg | ORAL_CAPSULE | ORAL | Status: DC

## 2013-05-08 NOTE — Consult Note (Signed)
  Review of Systems  Constitutional: Negative.   HENT: Negative.   Eyes: Negative.   Respiratory: Negative.   Cardiovascular: Negative.   Gastrointestinal: Negative.   Genitourinary: Negative.   Musculoskeletal: Negative.   Skin: Negative.   Neurological: Negative.   Endo/Heme/Allergies: Negative.   Psychiatric/Behavioral: Positive for substance abuse.   John [name] says he has no aches or pains.
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
## 2013-05-08 NOTE — Progress Notes (Signed)
Patient declined at [facility].  [facility] reports that the patient needs a higher level of care. Patient has is still under review the following hospitals:  [facility] for wait list  [facility]

## 2013-05-08 NOTE — BH Assessment (Signed)
Patient support paperwork completed to include ROI, Voluntary Consent, and Admission Checklist.   Shaune Pollack, MS, [place] Assessment Counselor

## 2013-05-08 NOTE — ED Notes (Signed)
Report called to Cyril Mourning, Therapist, sports at [facility].  [facility] ready for pt. At 1800.

## 2013-05-08 NOTE — ED Provider Notes (Signed)
Medical screening examination/treatment/procedure(s) were performed by non-physician practitioner and as supervising physician I was immediately available for consultation/collaboration.    Teressa Lower, MD 05/08/13 [id number]

## 2013-05-08 NOTE — ED Notes (Signed)
[facility] called to transport pt. To [facility], [facility] to arrive at 1750
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
## 2013-05-08 NOTE — Progress Notes (Signed)
Accepted to [facility] Bed 304-2. Incoming staff will need to complete support paperwork.  Writer informed the nurse working with the patient.  Dr. Sabra Heck is the accepting doctor.
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
## 2013-05-08 NOTE — Progress Notes (Addendum)
Patient has been delined at [facility] because patient does not meet criteria for admission. Patient has been referred to the following hospitals:   [facility] for wait list  [facility]

## 2013-05-08 NOTE — Consult Note (Signed)
  John [name] says drinking is his main issue is drinking alcohol.  All his other issues arise from that.  He is homeless, has never been in detox or rehab.  He is not close to any family and has no support.  He has been homeless for years and is not on disability, he says.He has been accepted to [facility] for treatment of his alcohol dependence to bed 304-2.

## 2013-05-08 NOTE — Progress Notes (Signed)
Per, Randall Hiss patient is being reviewed for placement at [facility].  Per, Dr. Lovena Le the patient meets criteria for inpatient hospitalization

## 2013-05-08 NOTE — Progress Notes (Signed)
D: Patient resting in bed with eyes closed.  Respirations even and unlabored.  Patient appears to be in no apparent distress. A: Staff to monitor Q 15 mins for safety.   R:Patient remains safe on the unit.  

## 2013-05-08 NOTE — Progress Notes (Signed)
The following facilities have been contacted regarding bed availability for inptx:  AT CAPACITY:  [facility] per [facility] per [facility] per [facility] per [facility] per [facility] per [facility] per [facility] per [place] FAXED TO: [place] for wait list [facility]   [facility] Disposition M[facility]

## 2013-05-08 NOTE — Progress Notes (Addendum)
Dispo Tech will refer patient to other facilities.  Per, [facility] is at capacity.

## 2013-05-08 NOTE — ED Provider Notes (Signed)
CSN: [id number]     Arrival date & time 05/07/13  2140 History   First MD Initiated Contact with Patient 05/07/13 2302     Chief Complaint  Patient presents with  . Medical Clearance     (Consider location/radiation/quality/duration/timing/severity/associated sxs/prior Treatment) HPI  Past Medical History  Diagnosis Date  . Asthma   . HIV (human immunodeficiency virus infection)     "dx'd ~ 2 yr ago" (09/29/2012)  . Mental disorder   . Anxiety   . Depression   . Chronic low back pain    Past Surgical History  Procedure Laterality Date  . Skin graft full thickness leg Left ?    POSTERIOR LEFT LEG  AFTER DOG BITES   History reviewed. No pertinent family history. History  Substance Use Topics  . Smoking status: Current Every Day Smoker -- 0.50 packs/day for 27 years    Types: Cigarettes  . Smokeless tobacco: Never Used  . Alcohol Use: 15.6 oz/week    26 Shots of liquor per week     Comment: 09/29/2012 "drink 1 1/2 fifths/ of vodka/wk"    Review of Systems    Allergies  Shellfish allergy  Home Medications  No current outpatient prescriptions on file. BP 143/87  Pulse 53  Temp(Src) 98.1 F (36.7 C) (Oral)  Resp 15  Wt 190 lb (86.183 kg)  SpO2 100% Physical Exam  ED Course  Procedures (including critical care time) Labs Review Labs Reviewed  COMPREHENSIVE METABOLIC PANEL - Abnormal; Notable for the following:    Glucose, Bld 106 (*)    All other components within normal limits  ETHANOL - Abnormal; Notable for the following:    Alcohol, Ethyl (B) 133 (*)    All other components within normal limits  URINE RAPID DRUG SCREEN (HOSP PERFORMED) - Abnormal; Notable for the following:    Cocaine POSITIVE (*)    Tetrahydrocannabinol POSITIVE (*)    All other components within normal limits  CBC   Imaging Review No results found.   EKG Interpretation  Date/Time:    Ventricular Rate:    PR Interval:    QRS Duration:   QT Interval:    QTC Calculation:    R Axis:     Text Interpretation:         MDM   Final diagnoses:  PTSD (post-traumatic stress disorder)        Garald Balding, NP 05/11/13 1958

## 2013-05-08 NOTE — Tx Team (Signed)
Initial Interdisciplinary Treatment Plan  PATIENT STRENGTHS: (choose at least two) Capable of independent living Motivation for treatment/growth Supportive family/friends  PATIENT STRESSORS: Financial difficulties Health problems Substance abuse   PROBLEM LIST: Problem List/Patient Goals Date to be addressed Date deferred Reason deferred Estimated date of resolution  Better coping skills      Having si and hi thoughts      Substance abuse      depression                                     DISCHARGE CRITERIA:  Ability to meet basic life and health needs Adequate post-discharge living arrangements Improved stabilization in mood, thinking, and/or behavior Withdrawal symptoms are absent or subacute and managed without 24-hour nursing intervention  PRELIMINARY DISCHARGE PLAN: Attend aftercare/continuing care group Attend PHP/IOP Attend 12-step recovery group  PATIENT/FAMIILY INVOLVEMENT: This treatment plan has been presented to and reviewed with the patient, [name].  The patient and family have been given the opportunity to ask questions and make suggestions.  [name] M 05/08/2013, 7:00 PM

## 2013-05-09 DIAGNOSIS — F1020 Alcohol dependence, uncomplicated: Secondary | ICD-10-CM

## 2013-05-09 DIAGNOSIS — F323 Major depressive disorder, single episode, severe with psychotic features: Secondary | ICD-10-CM

## 2013-05-09 MED ORDER — RISPERIDONE 0.5 MG PO TABS
0.5000 mg | ORAL_TABLET | Freq: Every day | ORAL | Status: DC
  Administered 2013-05-10: 0.5 mg via ORAL
  Filled 2013-05-09 (×3): qty 1

## 2013-05-09 MED ORDER — TRAZODONE HCL 100 MG PO TABS
100.0000 mg | ORAL_TABLET | Freq: Every evening | ORAL | Status: DC | PRN
  Administered 2013-05-09 – 2013-05-15 (×6): 100 mg via ORAL
  Filled 2013-05-09 (×14): qty 1
  Filled 2013-05-09: qty 28
  Filled 2013-05-09 (×3): qty 1
  Filled 2013-05-09: qty 28

## 2013-05-09 MED ORDER — LORAZEPAM 1 MG PO TABS
2.0000 mg | ORAL_TABLET | Freq: Once | ORAL | Status: AC
  Administered 2013-05-09: 2 mg via ORAL

## 2013-05-09 MED ORDER — RISPERIDONE 1 MG PO TABS
1.0000 mg | ORAL_TABLET | Freq: Every day | ORAL | Status: DC
  Filled 2013-05-09: qty 1

## 2013-05-09 MED ORDER — RISPERIDONE 1 MG PO TABS
ORAL_TABLET | ORAL | Status: AC
  Administered 2013-05-09: 12:00:00
  Filled 2013-05-09: qty 1

## 2013-05-09 MED ORDER — RISPERIDONE 1 MG PO TABS: 1.0000 mg | ORAL_TABLET | Freq: Every day | ORAL | Status: DC

## 2013-05-09 MED ORDER — RISPERIDONE 1 MG PO TABS
1.0000 mg | ORAL_TABLET | Freq: Every day | ORAL | Status: DC
  Administered 2013-05-09: 1 mg via ORAL
  Filled 2013-05-09 (×3): qty 1

## 2013-05-09 MED ORDER — RISPERIDONE 1 MG PO TABS
1.0000 mg | ORAL_TABLET | Freq: Every day | ORAL | Status: DC
Start: 2013-05-09 — End: 2013-05-09

## 2013-05-09 MED ORDER — LORAZEPAM 1 MG PO TABS
ORAL_TABLET | ORAL | Status: AC
  Administered 2013-05-09: 2 mg
  Filled 2013-05-09: qty 2

## 2013-05-09 MED ORDER — ENSURE COMPLETE PO LIQD
237.0000 mL | Freq: Two times a day (BID) | ORAL | Status: DC
  Administered 2013-05-09 – 2013-05-15 (×7): 237 mL via ORAL

## 2013-05-09 MED ORDER — CITALOPRAM HYDROBROMIDE 10 MG PO TABS
10.0000 mg | ORAL_TABLET | Freq: Every day | ORAL | Status: DC
Start: 2013-05-10 — End: 2013-05-16
  Administered 2013-05-10 – 2013-05-15 (×6): 10 mg via ORAL
  Filled 2013-05-09 (×8): qty 1
  Filled 2013-05-09: qty 14
  Filled 2013-05-09 (×2): qty 1

## 2013-05-09 NOTE — Progress Notes (Signed)
NUTRITION ASSESSMENT  Pt identified as at risk on the Malnutrition Screen Tool  INTERVENTION: 1. Educated patient on the importance of nutrition and encouraged intake of food and beverages. 2. Discussed weight goals. 3. Supplements: MVI and thiamine daily.  Ensure Complete po BID, each supplement provides 350 kcal and 13 grams of protein   NUTRITION DIAGNOSIS: Unintentional weight loss related to sub-optimal intake as evidenced by pt report.   Goal: Pt to meet >/= 90% of their estimated nutrition needs.  Monitor:  PO intake  Assessment:  Patient admitted for etoh detox.  Hx of bipolar adn schizophrenia per patient.  Hx also includes HIV.  Patient reports a 15-20 lb weight loss in the past 1 1/2 weeks secondary to etoh abuse.  This has not been confirmed with weight hx below and it appears patient has gained weight over the past year.  Patient states that he ate a good breakfast but then vomited.  43 y.o. male  Height: Ht Readings from Last 1 Encounters:  05/08/13 5' 8.5" (1.74 m)    Weight: Wt Readings from Last 1 Encounters:  05/08/13 185 lb (83.915 kg)    Weight Hx: Wt Readings from Last 10 Encounters:  05/08/13 185 lb (83.915 kg)  05/07/13 190 lb (86.183 kg)  04/18/13 189 lb 12 oz (86.07 kg)  03/03/13 180 lb (81.647 kg)  12/13/12 180 lb (81.647 kg)  11/24/12 182 lb (82.555 kg)  09/30/12 182 lb 1.6 oz (82.6 kg)  08/22/12 178 lb 8 oz (80.967 kg)  06/06/12 176 lb (79.833 kg)  05/03/12 177 lb 12 oz (80.627 kg)    BMI:  Body mass index is 27.72 kg/(m^2). Pt meets criteria for overweight based on current BMI.  Estimated Nutritional Needs: Kcal: 25-30 kcal/kg Protein: > 1 gram protein/kg Fluid: 1 ml/kcal  Diet Order: General Pt is also offered choice of unit snacks mid-morning and mid-afternoon.  Pt is eating as desired.   Lab results and medications reviewed.   Antonieta Iba, RD, LDN

## 2013-05-09 NOTE — [facility] Group Notes (Signed)
[facility] LCSW Group Therapy  05/09/2013 2:33 PM  Type of Therapy:  Group Therapy  Participation Level:  None  Participation Quality:  Drowsy  Affect:  Lethargic  Cognitive:  Lacking  Insight:  None  Engagement in Therapy:  None  Modes of Intervention:  Confrontation, Discussion, Education, Exploration, Problem-solving, Rapport Building, Socialization and Support  Summary of Progress/Problems: Emotion Regulation: This group focused on both positive and negative emotion identification and allowed group members to process ways to identify feelings, regulate negative emotions, and find healthy ways to manage internal/external emotions. Group members were asked to reflect on a time when their reaction to an emotion led to a negative outcome and explored how alternative responses using emotion regulation would have benefited them. Group members were also asked to discuss a time when emotion regulation was utilized when a negative emotion was experienced. John [name] attended group and was able to state his name but refused to participate in group discussion. He quickly fell asleep and remained sleeping until group concluded. At this time, John [name] does not appear to be making progress in the group setting AEB his inability to remain awake and attentive.    Smart, Rithwik Schmieg LCSWA  05/09/2013, 2:33 PM

## 2013-05-09 NOTE — Clinical Social Work Note (Signed)
CSW attempted to meet with pt to complete PSA. PT reports that he is sleepy. CSW to meet with pt tomorrow to complete PSA and discuss aftercare options. Pt has [facility] admission date of Tuesday, March 3rd and will follow up at [facility] for med management. Pt not accepted to [facility] due to mental health diagnosis.   [address], [place]  05/09/2013 2:47 PM
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
## 2013-05-09 NOTE — Tx Team (Signed)
Interdisciplinary Treatment Plan Update (Adult)  Date: 05/09/2013   Time Reviewed: 10:14 AM  Progress in Treatment:  Attending groups:No.  Participating in groups: No.   Taking medication as prescribed: Yes  Tolerating medication: Yes  Family/Significant othe contact made: Not yet. SPE required for this pt.  Patient understands diagnosis: Yes, AEB seeking treatment for SI/HI toward his partner, Mood stabilization, ETOH detox, and medication management for mood stabilization and elimination of AVH.  Discussing patient identified problems/goals with staff: Yes  Medical problems stabilized or resolved: Yes  Denies suicidal/homicidal ideation: Pt reports passive HI/SI this morning but is able to contract for safety and does not endorse specific plan.  Patient has not harmed self or Others: Yes  New problem(s) identified: pt not currently attending groups.  Discharge Plan or Barriers: Pt currently not attending d/c planning group but reports he is seeking long term treatment. Pt homeless in [place] at this time and is not following up with anyone for med management. CSW assessing for appropriate referrals.  Additional comments: Pt is a 43 yr old [place] American male that has came in with SI and HI towards his partner. Pt stated he is seeing pink elephants. Pt has a hx of bipolar schizophrenia, hiv, asthma. Pt stated he does not have a plan for his si or hi. Pt is homeless and would like to be placed in a long term inpatient rehab facility. Pt drinks about 1/2 gallon or more of alcohol a night and smokes THC daily. Pt has visible tremors and c/o anxiety. Pt is calm and cooperative. Introduced to unit. Pt remains safe on unit. No further complaints at this time      Reason for Continuation of Hospitalization: Librium taper-withdrawals Mood stabilization HI/SI AVH Medication management  Estimated length of stay: 3-5 days  For review of initial/current patient goals, please see plan of care.   Attendees:  Patient:    Family:    Physician: Carlton Adam MD 05/09/2013 10:13 AM   Nursing: Karsten Fells RN 05/09/2013 10:13 AM   Clinical Social Worker [place], [place]  05/09/2013 10:13 AM   Other: Butch Penny RN  05/09/2013 10:13 AM   Other: Hardie Pulley. PA 05/09/2013 10:13 AM   Other: Gerline Legacy Nurse CM  05/09/2013 10:13 AM   Other:    Scribe for Treatment Team:  [place] LCSWA 05/09/2013 10:14 AM

## 2013-05-09 NOTE — Progress Notes (Signed)
Adult Psychoeducational Group Note  Date:  05/09/2013 Time:  8:00 PM  Group Topic/Focus:  AA  Participation Level:  None  Participation Quality:  N/A  Affect:  Anxious and Flat  Cognitive:  N/A  Insight: None  Engagement in Group:  None  Modes of Intervention:  Education and Support  Additional Comments:    Jacob Moores [name] 05/09/2013, 12:03 AM

## 2013-05-09 NOTE — [facility] Group Notes (Signed)
[facility] LCSW Aftercare Discharge Planning Group Note   05/09/2013 9:19 AM  Participation Quality:  DID NOT ATTEND-pt in bed/not willing to attend group.   [employer]

## 2013-05-09 NOTE — Progress Notes (Signed)
D: Patient reports on and off thoughts of SI; patient reports HI towards a specific person but no plan and social worker Nira Conn was made aware; patient reports that his positive auditory hallucinations and they are telling him to harm the person; patient is positive for visual hallucinations and reports that he sees pink pigs; patient reports anxiety  A: Monitored q 15 minutes; patient encouraged to attend groups; patient educated about medications; patient given medications per physician orders; patient encouraged to express feelings and/or concerns  R: Patient has been laying in the bed all day; patient reports constant thoughts but he contracts for safety; patient is able to calm down and is laying in the bed sleep; patient's interaction with staff and peers is appropriate; patient was able to set goal to talk with staff 1:1 when having feelings of SI; patient is taking medications as prescribed and tolerating medications; patient has not attended any groups

## 2013-05-09 NOTE — [facility] Suicide Risk Assessment (Signed)
Suicide Risk Assessment  Admission Assessment     Nursing information obtained from:  Patient Demographic factors:  Male;Gay, lesbian, or bisexual orientation;Low socioeconomic status;Living alone;Unemployed Current Mental Status:  Suicidal ideation indicated by patient;Thoughts of violence towards others Loss Factors:  Decrease in vocational status;Financial problems / change in socioeconomic status Historical Factors:  Prior suicide attempts;Family history of mental illness or substance abuse Risk Reduction Factors:  NA Total Time spent with patient: 1 hour  CLINICAL FACTORS:   Depression:   Comorbid alcohol abuse/dependence Delusional Insomnia Alcohol/Substance Abuse/Dependencies More than one psychiatric diagnosis Currently Psychotic  COGNITIVE FEATURES THAT CONTRIBUTE TO RISK:  Closed-mindedness Polarized thinking Thought constriction (tunnel vision)    SUICIDE RISK:   Moderate:  Frequent suicidal ideation with limited intensity, and duration, some specificity in terms of plans, no associated intent, good self-control, limited dysphoria/symptomatology, some risk factors present, and identifiable protective factors, including available and accessible social support.  PLAN OF CARE: Supportive approach/coping skills/relapse prevention                              Librium Detox                              Reassess and address the co morbidities  I certify that inpatient services furnished can reasonably be expected to improve the patient's condition.  Addisyn Leclaire A 05/09/2013, 8:33 PM

## 2013-05-09 NOTE — Progress Notes (Signed)
Recreation Therapy Notes  Date: 02.25.2015  Time: 2:45pm Location: [address] Dayroom   Group Topic: Understanding mental health   Goal Area(s) Addresses:  Patient will identify benefits to understanding mental health. Patient will work effectively with teammates.   Behavioral Response: Did not attend.    Laureen Ochs Hamza Empson, LRT/CTRS  [name] 05/09/2013 4:42 PM

## 2013-05-09 NOTE — Progress Notes (Signed)
D:Patient in his room on approach.  Patient states he has been sleeping most of the day.  Patient appears flat and depressed.  Patient states he has passive SI endorses HI towards a former partner.   Patient also states he hears voices that tell him to kill and he is seeing pink elephants.  Patient verbally contracts for safety.  Patient calm and cooperative.  Patient states he had a bad day because he has not felt well.   A: Staff to monitor Q 15 mins for safety.  Encouragement and support offered.  Scheduled medications administered per orders. R: Patient remains safe on the unit.  Patient did not attend group tonight.  Patient not visible on the unit tonight.  Patient taking administered medications.

## 2013-05-10 DIAGNOSIS — F4310 Post-traumatic stress disorder, unspecified: Secondary | ICD-10-CM

## 2013-05-10 DIAGNOSIS — B20 Human immunodeficiency virus [HIV] disease: Secondary | ICD-10-CM

## 2013-05-10 MED ORDER — ALBUTEROL SULFATE HFA 108 (90 BASE) MCG/ACT IN AERS
INHALATION_SPRAY | RESPIRATORY_TRACT | Status: AC
  Administered 2013-05-10: 08:00:00
  Filled 2013-05-10: qty 6.7

## 2013-05-10 MED ORDER — QUETIAPINE FUMARATE 50 MG PO TABS
50.0000 mg | ORAL_TABLET | Freq: Two times a day (BID) | ORAL | Status: DC
  Administered 2013-05-10 – 2013-05-14 (×8): 50 mg via ORAL
  Filled 2013-05-10 (×13): qty 1

## 2013-05-10 MED ORDER — QUETIAPINE FUMARATE 100 MG PO TABS
100.0000 mg | ORAL_TABLET | Freq: Every day | ORAL | Status: DC
  Administered 2013-05-11 – 2013-05-13 (×3): 100 mg via ORAL
  Filled 2013-05-10 (×6): qty 1

## 2013-05-10 NOTE — Progress Notes (Signed)
[name] Progress Note  05/10/2013 5:07 PM John [name]  MRN:  [id number] Subjective:  [name] ws not feeling too well. Report drowsiness. He continues to experience auditory hallucinations voices telling him  to hurt people. States that he  hs enough self control not to act on the voices. Did not do too well on the trial with Risperdal. States he not sleep as well last night. He states he thinks he did better on Seroquel, willing to give it a try. Will also ask for an opinion concerning his HIV status and management at this stage Diagnosis:   DSM5: Schizophrenia Disorders:  none Obsessive-Compulsive Disorders:  none Trauma-Stressor Disorders:  Posttraumatic Stress Disorder (309.81) Substance/Addictive Disorders:  Alcohol Related Disorder - Severe (303.90) Depressive Disorders:  Major Depressive Disorder - with Psychotic Features (296.24) Total Time spent with patient: 30 minutes  Axis I: Psychotic Disorder NOS  ADL's:  Intact  Sleep: Poor  Appetite:  Fair  Suicidal Ideation:  Plan:  denies Intent:  denies Means:  denies Homicidal Ideation:  Plan:  denies Intent:  denies Means:  denies AEB (as evidenced by):  Psychiatric Specialty Exam: Physical Exam  ROS  Blood pressure 134/84, pulse 107, temperature 97.6 F (36.4 C), temperature source Oral, resp. rate 18, height 5' 8.5" (1.74 m), weight 83.915 kg (185 lb).Body mass index is 27.72 kg/(m^2).  General Appearance: Disheveled  Eye Contact::  Minimal  Speech:  Slow and not spontaneous  Volume:  Decreased  Mood:  Anxious, Depressed and worried  Affect:  Restricted  Thought Process:  Coherent and Goal Directed  Orientation:  Other:  person, place  Thought Content:  no spontanoues content, answer questions, reserved, guarded, pos of auditory hallucinations  Suicidal Thoughts:  Yes.  without intent/plan  Homicidal Thoughts:  Yes.  without intent/plan  Memory:  Immediate;   Fair Recent;   Fair Remote;   Fair  Judgement:   Fair  Insight:  Present and Shallow  Psychomotor Activity:  Restlessness  Concentration:  Poor  Recall:  Poor  Fund of Knowledge:NA  Language: Fair  Akathisia:  No  Handed:    AIMS (if indicated):     Assets:  Desire for Improvement  Sleep:  Number of Hours: 6   Musculoskeletal: Strength & Muscle Tone: within normal limits Gait & Station: normal Patient leans: N/A  Current Medications: Current Facility-Administered Medications  Medication Dose Route Frequency Provider Last Rate Last Dose  . acetaminophen (TYLENOL) tablet 650 mg  650 mg Oral Q6H PRN Clarene Reamer, MD   650 mg at 05/09/13 4098  . albuterol (PROVENTIL) (2.5 MG/3ML) 0.083% nebulizer solution 2.5 mg  2.5 mg Nebulization Q4H PRN Clarene Reamer, MD      . alum & mag hydroxide-simeth (MAALOX/MYLANTA) 200-200-20 MG/5ML suspension 30 mL  30 mL Oral Q4H PRN Clarene Reamer, MD      . chlordiazePOXIDE (LIBRIUM) capsule 25 mg  25 mg Oral TID Clarene Reamer, MD   25 mg at 05/10/13 1221   Followed by  . [START ON 05/11/2013] chlordiazePOXIDE (LIBRIUM) capsule 25 mg  25 mg Oral BH-qamhs Clarene Reamer, MD       Followed by  . [START ON 05/12/2013] chlordiazePOXIDE (LIBRIUM) capsule 25 mg  25 mg Oral Daily Clarene Reamer, MD      . citalopram (CELEXA) tablet 10 mg  10 mg Oral Daily Nicholaus Bloom, MD   10 mg at 05/10/13 0754  . feeding supplement (ENSURE COMPLETE) (ENSURE COMPLETE) liquid 237  mL  237 mL Oral BID BM Darrol Jump, RD   237 mL at 05/10/13 1302  . fluticasone (FLOVENT HFA) 44 MCG/ACT inhaler 1 puff  1 puff Inhalation BID Clarene Reamer, MD   1 puff at 05/10/13 0754  . hydrOXYzine (ATARAX/VISTARIL) tablet 25 mg  25 mg Oral Q6H PRN Clarene Reamer, MD      . ibuprofen (ADVIL,MOTRIN) tablet 600 mg  600 mg Oral Q8H PRN Clarene Reamer, MD      . loperamide (IMODIUM) capsule 2-4 mg  2-4 mg Oral PRN Clarene Reamer, MD      . magnesium hydroxide (MILK OF MAGNESIA) suspension 30 mL  30 mL Oral Daily PRN Clarene Reamer,  MD      . multivitamin with minerals tablet 1 tablet  1 tablet Oral Daily Clarene Reamer, MD   1 tablet at 05/10/13 0755  . nicotine (NICODERM CQ - dosed in mg/24 hours) patch 21 mg  21 mg Transdermal Daily Clarene Reamer, MD   21 mg at 05/10/13 0755  . ondansetron (ZOFRAN-ODT) disintegrating tablet 4 mg  4 mg Oral Q6H PRN Clarene Reamer, MD      . QUEtiapine (SEROQUEL) tablet 100 mg  100 mg Oral QHS Nicholaus Bloom, MD      . QUEtiapine (SEROQUEL) tablet 50 mg  50 mg Oral BID Nicholaus Bloom, MD   50 mg at 05/10/13 1302  . thiamine (B-1) injection 100 mg  100 mg Intramuscular Once Clarene Reamer, MD      . thiamine (VITAMIN B-1) tablet 100 mg  100 mg Oral Daily Clarene Reamer, MD   100 mg at 05/10/13 0756  . traZODone (DESYREL) tablet 100 mg  100 mg Oral QHS,MR X 1 Nicholaus Bloom, MD   100 mg at 05/09/13 2214    Lab Results: No results found for this or any previous visit (from the past 48 hour(s)).  Physical Findings: AIMS: Facial and Oral Movements Muscles of Facial Expression: None, normal Lips and Perioral Area: None, normal Jaw: None, normal Tongue: None, normal,Extremity Movements Upper (arms, wrists, hands, fingers): None, normal Lower (legs, knees, ankles, toes): None, normal, Trunk Movements Neck, shoulders, hips: None, normal, Overall Severity Severity of abnormal movements (highest score from questions above): None, normal Incapacitation due to abnormal movements: None, normal Patient's awareness of abnormal movements (rate only patient's report): No Awareness, Dental Status Current problems with teeth and/or dentures?: No Does patient usually wear dentures?: No  CIWA:  CIWA-Ar Total: 6 COWS:  COWS Total Score: 4  Treatment Plan Summary: Daily contact with patient to assess and evaluate symptoms and progress in treatment Medication management  Plan: Supportive approach/coping skills/relapse prevention           Will start Seroquel 50 mg BID, 100 mg HS           D/C the  Risperdal  Medical Decision Making Problem Points:  Established problem, worsening (2) and Review of psycho-social stressors (1) Data Points:  Review or order clinical lab tests (1) Review of medication regiment & side effects (2) Review of new medications or change in dosage (2)  I certify that inpatient services furnished can reasonably be expected to improve the patient's condition.   Kiasha Bellin A 05/10/2013, 5:07 PM

## 2013-05-10 NOTE — Progress Notes (Signed)
Recreation Therapy Notes  Date: 02.26.2015 Time: 2:45pm Location: [address] Day[address]   Group Topic: Leisure Education  Goal Area(s) Addresses:  Patient will identify positive leisure activities.  Patient will identify positive emotions associated with leisure participation.  Patient will identify one positive benefit of participation in leisure activities.   Behavioral Response: Did not attend.   Laureen Ochs Tamika Shropshire, LRT/CTRS  [name] 05/10/2013 3:58 PM
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
## 2013-05-10 NOTE — Progress Notes (Signed)
The focus of this group is to educate the patient on the purpose and policies of crisis stabilization and provide a format to answer questions about their admission.  The group details unit policies and expectations of patients while admitted.  Patient did not attend 0900 nurse education orientation group this morning.  Patient stayed in bed sleeping.    

## 2013-05-10 NOTE — Progress Notes (Signed)
Did attend group

## 2013-05-10 NOTE — BHH Group Notes (Signed)
[place] LCSW Group Therapy  05/10/2013 2:43 PM  Type of Therapy:  Group Therapy  Participation Level:  Did Not Attend-pt in bed resting/refused to attend group/postive for AVH/prefers staying by himself/isolating while positive for AVH.   Smart, John [name] LCSWA  05/10/2013, 2:43 PM

## 2013-05-10 NOTE — Progress Notes (Signed)
Psychoeducational Group Note  Date:  05/10/2013 Time:  2100 Group Topic/Focus:  wrap up group  Participation Level: Did Not Attend  Participation Quality:  Not Applicable  Affect:  Not Applicable  Cognitive:  Not Applicable  Insight:  Not Applicable  Engagement in Group: Not Applicable  Additional Comments: Pt remained in bed during group.   Jacques Navy 05/10/2013, 11:09 PM

## 2013-05-10 NOTE — Progress Notes (Signed)
D:  Patient's self inventory sheet, patient needs sleep medication, has poor appetite, low energy level, poor attention span.  Rated depression 8, hopeless 7, anxiety 2.  SI off/on, contracts for safety.   Has experienced dizziness in past 24 hours.  HI to Herbie Baltimore who has talked bad about his deceased mother.  Hears voices to kill anybody around him, even [facility] staff.  Continues to see pink/black elephants and martians.   A:  Medications administered per MD orders.  Emotional support and encouragement given patient. R:  SI & HI, contracts for safety.  Voices and visions of elephants/martians.  Will continue to monitor patient for safety with 15 minute checks.  Safety maintained.   Patient returned to bed after morning medications.  Refused group this morning.  MD informed of patient's comments.

## 2013-05-10 NOTE — Progress Notes (Signed)
D: Patient in the bed asleep tonight.  Patient was hard to arouse and writer had to shake patient to wake up.  Patient states he has been extremely tired.  Patient snores when he sleeps.  Patient states he is passive, endorses homocidal ideations towards former male partner Herbie Baltimore for talking bad about his deceased mother.  Patient states he is still having auditory and visual hallucinations.  Patient verbally contracts for safety. A: Staff to monitor Q 15 mins for safety.  Encouragement and support offered.  Scheduled medications administered per orders.  Sleep medications held tonight due to patient drowsiness. R: Patient remains safe on the unit.  Patient did not attend group tonight.  Patient woke up one tine tonight to come to get snacks but went right back to his room and went to sleep.  Patient taking administered mediations.

## 2013-05-10 NOTE — Consult Note (Signed)
PCP:   Pcp Not In System   Chief Complaint:  HIV  HPI: 43 year old male with a history of asthma, HIV currently on no anti-retroviral therapy, bipolar disorder, PTSD, alcohol abuse who is currently admitted to the behavioral [place] for detox. As patient is not on any medications for HIV, [facility] were consulted for recommendations. Patient denies fatigue, no chest pain or shortness of breath. He had 2 episodes of vomiting and 4 loose bowel movements yesterday, which has resolved at this time. As per patient, he has followed the ID clinic over the past one year and saw physician last year, who did not initiate any antiretroviral therapy. His last CD4 count from February 2014 is 559.  Allergies:   Allergies  Allergen Reactions  . Shellfish Allergy Anaphylaxis and Swelling      Past Medical History  Diagnosis Date  . Asthma   . HIV (human immunodeficiency virus infection)     "dx'd ~ 2 yr ago" (09/29/2012)  . Mental disorder   . Anxiety   . Depression   . Chronic low back pain     Past Surgical History  Procedure Laterality Date  . Skin graft full thickness leg Left ?    POSTERIOR LEFT LEG  AFTER DOG BITES    Prior to Admission medications   Medication Sig Start Date End Date Taking? Authorizing Provider  albuterol (PROVENTIL HFA;VENTOLIN HFA) 108 (90 BASE) MCG/ACT inhaler Inhale 2 puffs into the lungs every 4 (four) hours as needed for wheezing or shortness of breath. 03/03/13   Idalia Needle. San[place], PA-C  beclomethasone (QVAR) 80 MCG/ACT inhaler Inhale 1 puff into the lungs 2 (two) times daily.    Historical Provider, MD    Social History:  reports that he has been smoking Cigarettes.  He has a 13.5 pack-year smoking history. He has never used smokeless tobacco. He reports that he drinks about 15.6 ounces of alcohol per week. He reports that he uses illicit drugs (Marijuana) about 7 times per week.  History reviewed. No pertinent family history.   All the  positives are listed in BOLD  Review of Systems:  HEENT: Headache, blurred vision, runny nose, sore throat Neck: Hypothyroidism, hyperthyroidism,,lymphadenopathy Chest : Shortness of breath, history of COPD, Asthma Heart : Chest pain, history of coronary arterey disease GI:  Nausea, vomiting, diarrhea, constipation, GERD GU: Dysuria, urgency, frequency of urination, hematuria Neuro: Stroke, seizures, syncope Psych: Depression, anxiety, hallucinations   Physical Exam: Blood pressure 134/84, pulse 107, temperature 97.6 F (36.4 C), temperature source Oral, resp. rate 18, height 5' 8.5" (1.74 m), weight 83.915 kg (185 lb). Constitutional:   Patient is a well-developed and well-nourished male* in no acute distress and cooperative with exam. Head: Normocephalic and atraumatic Mouth: Mucus membranes moist Eyes: PERRL, EOMI, conjunctivae normal Neck: Supple, No Thyromegaly Cardiovascular: RRR, S1 normal, S2 normal Pulmonary/Chest: CTAB, no wheezes, rales, or rhonchi Abdominal: Soft. Non-tender, non-distended, bowel sounds are normal, no masses, organomegaly, or guarding present.  Neurological: A&O x3, Strenght is normal and symmetric bilaterally, cranial nerve II-XII are grossly intact, no focal motor deficit, sensory intact to light touch bilaterally.  Extremities : No Cyanosis, Clubbing or Edema   Labs on Admission:  No results found for this or any previous visit (from the past 48 hour(s)).  Radiological Exams on Admission: No results found.  Assessment/Plan Active Problems:   Alcohol dependence  HIV ? Viral gastroenteritis  HIV Patient does not appear to be having worsening of the symptoms of HIV  His last CD4 count was greater than 500 He is being followed by the infectious disease clinic as outpatient Recommended to continue follow up. Called and discussed with Dr Karolee Ohs( ID )she will look up the clinic notes and inform us about antiretroviral therapy  in am.  ?  Viral gastroenteritis Resolved Patient had 2 episodes of vomiting and 4 loose bowel movements yesterday. As per patient the symptoms have resolved, he is able to eat and drink without any problem.   Time Spent on Admission: 55 min  [facility] Pager: [phone] 05/10/2013, 3:33 PM  If 7PM-7AM, please contact night-coverage  [email or web address]  Password [email or web address]

## 2013-05-11 DIAGNOSIS — F29 Unspecified psychosis not due to a substance or known physiological condition: Secondary | ICD-10-CM

## 2013-05-11 DIAGNOSIS — F17200 Nicotine dependence, unspecified, uncomplicated: Secondary | ICD-10-CM

## 2013-05-11 MED ORDER — BENAZEPRIL HCL 10 MG PO TABS
10.0000 mg | ORAL_TABLET | Freq: Every day | ORAL | Status: DC
  Administered 2013-05-12 – 2013-05-15 (×4): 10 mg via ORAL
  Filled 2013-05-11 (×2): qty 1
  Filled 2013-05-11: qty 14
  Filled 2013-05-11 (×4): qty 1

## 2013-05-11 MED ORDER — BENAZEPRIL HCL 20 MG PO TABS
20.0000 mg | ORAL_TABLET | Freq: Once | ORAL | Status: AC
  Administered 2013-05-11: 20 mg via ORAL
  Filled 2013-05-11: qty 2
  Filled 2013-05-11: qty 1

## 2013-05-11 NOTE — Progress Notes (Addendum)
[facility] PROGRESS NOTE  John [name] OAC:[id number] DOB: [birth year] DOA: 05/08/2013 PCP: Pcp Not In System  Assessment/Plan: 1. HIV positive. Patient having his last CD4 count of 559 on 04/18/2013. He does not need to be started on antiretroviral therapy at this point and should followup with infectious disease in the outpatient setting. Case was discussed with Dr Baxter Flattery of infectious disease. Would recommend setting him up with an appointment at the infectious disease clinic upon discharge. 2. Diarrhea. Very unlikely to be due to an opportunistic infection given his CD4 counts in the 500 range. Likely secondary to viral gastroenteritis which has resolved. 3. Decreased by mouth intake. Could be secondary to underlying depression. He did report having a few bites of his breakfast today.   Will sign off, please call with any questions.    HPI/Subjective: Patient is a 43 year old gentleman with a past medical history of HIV positive, with his most recent CD4 count of 559 on 04/18/2013. He is presently not on antiretroviral therapy and is currently being followed up at the infectious disease clinic. Patient reporting several episodes of diarrhea last week however this has resolved. He complains of decreased by mouth intake, anorexia, with ongoing depressive symptoms.  Objective: Filed Vitals:   05/11/13 1101  BP: 149/98  Pulse: 85  Temp:   Resp:    No intake or output data in the 24 hours ending 05/11/13 1228 Filed Weights   05/08/13 1843  Weight: 83.915 kg (185 lb)    Exam:   General:  Depressed affect, no acute distress awake alert oriented  Cardiovascular: Regular rate rhythm normal S1-S2  Respiratory: Lungs are clear to auscultation bilaterally  Abdomen: Soft nontender nondistended  Musculoskeletal: No edema  Data Reviewed: Basic Metabolic Panel:  Recent Labs Lab 05/07/13 2318  NA 138  K 3.8  CL 98  CO2 21  GLUCOSE 106*  BUN 15  CREATININE 0.91   CALCIUM 9.2   Liver Function Tests:  Recent Labs Lab 05/07/13 2318  AST 26  ALT 18  ALKPHOS 81  BILITOT 0.3  PROT 7.9  ALBUMIN 4.0   No results found for this basename: LIPASE, AMYLASE,  in the last 168 hours No results found for this basename: AMMONIA,  in the last 168 hours CBC:  Recent Labs Lab 05/07/13 2318  WBC 4.1  HGB 13.7  HCT 39.7  MCV 90.6  PLT 306   Cardiac Enzymes: No results found for this basename: CKTOTAL, CKMB, CKMBINDEX, TROPONINI,  in the last 168 hours BNP (last 3 results)  Recent Labs  05/03/13 0149  PROBNP 60.7   CBG: No results found for this basename: GLUCAP,  in the last 168 hours  No results found for this or any previous visit (from the past 240 hour(s)).   Studies: No results found.  Scheduled Meds: . [START ON 05/12/2013] benazepril  10 mg Oral Daily  . benazepril  20 mg Oral Once  . chlordiazePOXIDE  25 mg Oral BH-qamhs   Followed by  . [START ON 05/12/2013] chlordiazePOXIDE  25 mg Oral Daily  . citalopram  10 mg Oral Daily  . feeding supplement (ENSURE COMPLETE)  237 mL Oral BID BM  . fluticasone  1 puff Inhalation BID  . multivitamin with minerals  1 tablet Oral Daily  . nicotine  21 mg Transdermal Daily  . QUEtiapine  100 mg Oral QHS  . QUEtiapine  50 mg Oral BID  . thiamine  100 mg Intramuscular Once  . thiamine  100 mg  Oral Daily  . traZODone  100 mg Oral QHS,MR X 1   Continuous Infusions:   Active Problems:   Alcohol dependence    Time spent: 25 min    Kelvin Cellar  [facility] Pager [phone]. If 7PM-7AM, please contact night-coverage at [email or web address], password [facility] 05/11/2013, 12:28 PM  LOS: 3 days

## 2013-05-11 NOTE — Progress Notes (Signed)
Recreation Therapy Notes   Date: 02.27.2015 Time: 2:45pm Location: [address] Dayroom    Group Topic: Communication, Team Building, Problem Solving  Goal Area(s) Addresses:  Patient will effectively work with peer towards shared goal.  Patient will identify skill used to make activity successful.  Patient will identify how skills used during activity can be used to build healthy support system.   Behavioral Response: Did not attend.   Laureen Ochs Winthrop Shannahan, LRT/CTRS  Yutaka Holberg L 05/11/2013 4:08 PM

## 2013-05-11 NOTE — Progress Notes (Addendum)
Patient ID: John [name], male   DOB: 1970-11-20, 43 y.o.   MRN: [id number] [facility] MD Progress Note  05/11/2013 11:48 AM John [name]  MRN:  [id number]  Subjective:  John [name] says, "I still feel very depressed and anxious. Rated his depression at #10 and anxiety at #5". He continues to endorse suicidal ideations and homicidal ideations towards John [name] who made bad comment about his mama. He complained of poor appetite.   Diagnosis:   DSM5: Schizophrenia Disorders:  none Obsessive-Compulsive Disorders:  none Trauma-Stressor Disorders:  Posttraumatic Stress Disorder (309.81) Substance/Addictive Disorders:  Alcohol Related Disorder - Severe (303.90) Depressive Disorders:  Major Depressive Disorder - with Psychotic Features (296.24) Total Time spent with patient: 30 minutes  Axis I: Psychotic Disorder NOS  ADL's:  Impaired  Sleep: Poor  Appetite:  Poor  Suicidal Ideation: "Yes" Plan:  denies Intent:  denies Means:  denies  Homicidal Ideation: "Yes" Plan:  denies Intent:  denies Means:  denies  AEB (as evidenced by): Per patient's report.  Psychiatric Specialty Exam: Physical Exam  Psychiatric: His speech is normal and behavior is normal. His mood appears anxious. Cognition and memory are normal. He expresses impulsivity. He exhibits a depressed mood. He expresses homicidal and suicidal ideation. He expresses no suicidal plans and no homicidal plans.    ROS  Blood pressure 141/113, pulse 105, temperature 97.2 F (36.2 C), temperature source Oral, resp. rate 24, height 5' 8.5" (1.74 m), weight 83.915 kg (185 lb).Body mass index is 27.72 kg/(m^2).  General Appearance: Disheveled  Eye Contact::  Minimal  Speech:  Slow and not spontaneous  Volume:  Decreased  Mood:  Anxious, Depressed and worried  Affect:  Restricted  Thought Process:  Coherent and Goal Directed  Orientation:  Other:  person, place  Thought Content:  no spontanoues content, answer questions, reserved,  guarded, pos of auditory hallucinations  Suicidal Thoughts:  Yes.  without intent/plan  Homicidal Thoughts:  Yes.  without intent/plan  Memory:  Immediate;   Fair Recent;   Fair Remote;   Fair  Judgement:  Fair  Insight:  Present and Shallow  Psychomotor Activity:  Restlessness  Concentration:  Poor  Recall:  Poor  Fund of Knowledge:NA  Language: Fair  Akathisia:  No  Handed:    AIMS (if indicated):     Assets:  Desire for Improvement  Sleep:  Number of Hours: 6.5   Musculoskeletal: Strength & Muscle Tone: within normal limits Gait & Station: normal Patient leans: N/A  Current Medications: Current Facility-Administered Medications  Medication Dose Route Frequency Provider Last Rate Last Dose  . acetaminophen (TYLENOL) tablet 650 mg  650 mg Oral Q6H PRN Clarene Reamer, MD   650 mg at 05/09/13 9629  . albuterol (PROVENTIL) (2.5 MG/3ML) 0.083% nebulizer solution 2.5 mg  2.5 mg Nebulization Q4H PRN Clarene Reamer, MD      . alum & mag hydroxide-simeth (MAALOX/MYLANTA) 200-200-20 MG/5ML suspension 30 mL  30 mL Oral Q4H PRN Clarene Reamer, MD      . Derrill Memo ON 05/12/2013] benazepril (LOTENSIN) tablet 10 mg  10 mg Oral Daily Encarnacion Slates, NP      . benazepril (LOTENSIN) tablet 20 mg  20 mg Oral Once Encarnacion Slates, NP      . chlordiazePOXIDE (LIBRIUM) capsule 25 mg  25 mg Oral BH-qamhs Clarene Reamer, MD   25 mg at 05/11/13 5284   Followed by  . [START ON 05/12/2013] chlordiazePOXIDE (LIBRIUM) capsule 25 mg  25 mg Oral  Daily Benjaman PottGerald D Taylor, MD      . citalopram (CELEXA) tablet 10 mg  10 mg Oral Daily Rachael FeeIrving A Lugo, MD   10 mg at 05/11/13 [phone]  . feeding supplement (ENSURE COMPLETE) (ENSURE COMPLETE) liquid 237 mL  237 mL Oral BID BM Jeoffrey MassedLaura Lee Jobe, RD   237 mL at 05/10/13 1302  . fluticasone (FLOVENT HFA) 44 MCG/ACT inhaler 1 puff  1 puff Inhalation BID Benjaman PottGerald D Taylor, MD   1 puff at 05/11/13 [id number]  . hydrOXYzine (ATARAX/VISTARIL) tablet 25 mg  25 mg Oral Q6H PRN Benjaman PottGerald D Taylor, MD       . ibuprofen (ADVIL,MOTRIN) tablet 600 mg  600 mg Oral Q8H PRN Benjaman PottGerald D Taylor, MD      . loperamide (IMODIUM) capsule 2-4 mg  2-4 mg Oral PRN Benjaman PottGerald D Taylor, MD      . magnesium hydroxide (MILK OF MAGNESIA) suspension 30 mL  30 mL Oral Daily PRN Benjaman PottGerald D Taylor, MD      . multivitamin with minerals tablet 1 tablet  1 tablet Oral Daily Benjaman PottGerald D Taylor, MD   1 tablet at 05/11/13 0835  . nicotine (NICODERM CQ - dosed in mg/24 hours) patch 21 mg  21 mg Transdermal Daily Benjaman PottGerald D Taylor, MD   21 mg at 05/11/13 0834  . ondansetron (ZOFRAN-ODT) disintegrating tablet 4 mg  4 mg Oral Q6H PRN Benjaman PottGerald D Taylor, MD      . QUEtiapine (SEROQUEL) tablet 100 mg  100 mg Oral QHS Rachael FeeIrving A Lugo, MD      . QUEtiapine (SEROQUEL) tablet 50 mg  50 mg Oral BID Rachael FeeIrving A Lugo, MD   50 mg at 05/11/13 [id number]  . thiamine (B-1) injection 100 mg  100 mg Intramuscular Once Benjaman PottGerald D Taylor, MD      . thiamine (VITAMIN B-1) tablet 100 mg  100 mg Oral Daily Benjaman PottGerald D Taylor, MD   100 mg at 05/11/13 0834  . traZODone (DESYREL) tablet 100 mg  100 mg Oral QHS,MR X 1 Rachael FeeIrving A Lugo, MD   100 mg at 05/09/13 2214    Lab Results: No results found for this or any previous visit (from the past 48 hour(s)).  Physical Findings: AIMS: Facial and Oral Movements Muscles of Facial Expression: None, normal Lips and Perioral Area: None, normal Jaw: None, normal Tongue: None, normal,Extremity Movements Upper (arms, wrists, hands, fingers): None, normal Lower (legs, knees, ankles, toes): None, normal, Trunk Movements Neck, shoulders, hips: None, normal, Overall Severity Severity of abnormal movements (highest score from questions above): None, normal Incapacitation due to abnormal movements: None, normal Patient's awareness of abnormal movements (rate only patient's report): No Awareness, Dental Status Current problems with teeth and/or dentures?: No Does patient usually wear dentures?: No  CIWA:  CIWA-Ar Total: 0 COWS:  COWS Total Score:  4  Treatment Plan Summary: Daily contact with patient to assess and evaluate symptoms and progress in treatment Medication management  Plan: Supportive approach/coping skills/relapse prevention Obtain HGBA1C for elevated glucose levels Continue Seroquel 50 mg BID, 100 mg HS Initiate Benazepril  20 mg once, and 10 mg daily starting 05/12/13 for HTN. Continue current plan of care      Medical Decision Making Problem Points:  Established problem, worsening (2) and Review of psycho-social stressors (1) Data Points:  Review or order clinical lab tests (1) Review of medication regiment & side effects (2) Review of new medications or change in dosage (2)  I certify that inpatient services furnished can  reasonably be expected to improve the patient's condition.   Lindell Spar I, PMHNP-BC 05/11/2013, 11:48 AM

## 2013-05-11 NOTE — Progress Notes (Signed)
Patient ID: John [name], male   DOB: October 08, 1970, 43 y.o.   MRN: [id number]  D: Patient has a flat affect on approach. Reports depression continues "8" on scale and feelings of hopelessness "7" on scale. Reports some passive SI "on and off". Contracts to come to staff if thoughts become worse. Minimal withdrawal symptoms reported. A: Staff will monitor on q 15 minute checks, follow treatment plan, and give meds as ordered. R: Cooperative on the unit.

## 2013-05-11 NOTE — BHH Group Notes (Signed)
[place] LCSW Group Therapy  05/11/2013 3:03 PM  Type of Therapy:  Group Therapy  Participation Level:  Minimal  Participation Quality:  Attentive  Affect:  Depressed and Flat  Cognitive:  Lacking  Insight:  Improving  Engagement in Therapy:  Improving  Modes of Intervention:  Confrontation, Discussion, Education, Exploration, Problem-solving, Rapport Building, Socialization and Support  Summary of Progress/Problems: Feelings around Relapse. Group members discussed the meaning of relapse and shared personal stories of relapse, how it affected them and others, and how they perceived themselves during this time. Group members were encouraged to identify triggers, warning signs and coping skills used when facing the possibility of relapse. Social supports were discussed and explored in detail. Post Acute Withdrawal Syndrome (handout provided) was introduced and examined. Pt's were encouraged to ask questions, talk about key points associated with PAWS, and process this information in terms of relapse prevention. John [name] was attentive during today's therapy group but minimally participated in group discussion. John [name] shared that he has experiences the symptoms associated with PAWS in the past and was able to acknowledge the importance of having a safety plan in place to prevent future relapse. John [name] shows some progress in the group setting AEB his ability to remain attentive throughout group and follow along as CSW reviewed the handout.    Smart, John [name] LCSWA  05/11/2013, 3:03 PM

## 2013-05-11 NOTE — BHH Group Notes (Signed)
[facility] LCSW Aftercare Discharge Planning Group Note   05/11/2013 9:34 AM  Participation Quality:  Minimal   Mood/Affect:  Depressed, Flat and Lethargic  Depression Rating:  10  Anxiety Rating:  10  Thoughts of Suicide:  No Will you contract for safety?   NA  Current AVH:  Yes-pt reports AVH this morning.   Plan for Discharge/Comments:  Pt aware of [facility] admission date being changed to Thursday of next week to give him time to get ID. Pt to follow up at [facility] for med management.   Transportation Means: bus   Supports: [employer]; partner   Proofreader, Research officer, trade union

## 2013-05-11 NOTE — Progress Notes (Signed)
D: Patient in hte dayroom on approach.  Patient c/o of depression tonight.  Patient states his day was average.  Patient states,"I am ready to take my medications so I can be knocked out."  Patient observed patient bright when talking to his peers tonight.  Patient calm and cooperative on the unit tonight.  Patient does states he has passive SI but verbally contracts for safety.  Patient endorsed HI toward an ex male partner by the name of Herbie Baltimore who he wants to kill stating Herbie Baltimore said bad things about patients deceased mother.  Patient also states he hears voices to tell him to kill.  Patient states he is seeing pink elephants in his room.  Patient verbally contracts for safety.   A: Staff to monitor Q 15 mins for safety.  Encouragement and support offered.  Scheduled medications administered per orders.  Patient refused Librium tonight.   R: Patient remains safe on the unit.  Patient did not attend group tonight.  Patient taking administered medications and interacting with peers.  Patient taking administered medications.

## 2013-05-11 NOTE — BHH Suicide Risk Assessment (Signed)
[place] INPATIENT:  Family/Significant Other Suicide Prevention Education  Suicide Prevention Education:  Patient Refusal for Family/Significant Other Suicide Prevention Education: The patient John [name] has refused to provide written consent for family/significant other to be provided Family/Significant Other Suicide Prevention Education during admission and/or prior to discharge.  Physician notified.  Pt refused to consent to family/friend contact. His partner is also homeless and does not have access to phone. SPE completed with pt. SPI pamphlet provided to pt and he was encouraged to share information with support network. Pt reports passive SI currently but is able to contract for safety. He does not report access to weapons. Pt reports no HI at this time. CSW and pt reviewed healthy coping skills to manage SI. Pt proved with mobile crisis number.   Smart, Joshual Terrio LCSWA  05/11/2013, 3:10 PM

## 2013-05-12 DIAGNOSIS — F331 Major depressive disorder, recurrent, moderate: Secondary | ICD-10-CM

## 2013-05-12 DIAGNOSIS — F1010 Alcohol abuse, uncomplicated: Secondary | ICD-10-CM

## 2013-05-12 LAB — HEMOGLOBIN A1C
HEMOGLOBIN A1C: 5.4 % (ref <5.7)
MEAN PLASMA GLUCOSE: 108 mg/dL (ref <117)

## 2013-05-12 NOTE — Progress Notes (Signed)
Patient ID: John [name], male   DOB: 01/19/1971, 43 y.o.   MR[facility] [id number] [facility]  05/12/2013 1:29 PM John [name]  MRN:  [id number]  Subjective:  John [name] says, "I still feel very depressed and anxious. Rated his depression at 9/10 and anxiety at 9/10". "My stomach hurts, that chicken alfredo I ate didn't agree with me. He reports not attending group, because of his mood swings " I dont want to snap on anyone".  He continues to endorse suicidal ideations and homicidal ideations towards John [name] who made bad comment about his mama. Im still hearing voices, and seeing pink elephants, and I still want to kill John [name] "he was talking crooked out his neck about my momma to get back at me". He is able to contract for safety.    Diagnosis:   DSM5: Schizophrenia Disorders:  none Obsessive-Compulsive Disorders:  none Trauma-Stressor Disorders:  Posttraumatic Stress Disorder (309.81) Substance/Addictive Disorders:  Alcohol Related Disorder - Severe (303.90) Depressive Disorders:  Major Depressive Disorder - with Psychotic Features (296.24) Total Time spent with patient: 30 minutes  Axis I: Psychotic Disorder NOS  ADL's:  Impaired  Sleep: Poor  Appetite:  Poor  Suicidal Ideation: "Yes" Plan:  denies Intent:  denies Means:  denies  Homicidal Ideation: "Yes" Plan:  denies Intent:  denies Means:  denies  AEB (as evidenced by): Per patient's report.  Psychiatric Specialty Exam: Physical Exam  Psychiatric: His speech is normal and behavior is normal. His mood appears anxious. Cognition and memory are normal. He expresses impulsivity. He exhibits a depressed mood. He expresses homicidal and suicidal ideation. He expresses no suicidal plans and no homicidal plans.    ROS   Blood pressure 133/85, pulse 84, temperature 98.2 F (36.8 C), temperature source Oral, resp. rate 19, height 5' 8.5" (1.74 m), weight 83.915 kg (185 lb).Body mass index is 27.72 kg/(m^2).   General Appearance: Disheveled  Eye Contact::  None  Speech:  Slow and not spontaneous  Volume:  Decreased  Mood:  Anxious, Depressed and worried  Affect:  Restricted  Thought Process:  Coherent and Goal Directed  Orientation:  Other:  person, place  Thought Content:  no spontanoues content, answer questions, reserved, guarded, pos of auditory hallucinations  Suicidal Thoughts:  Yes.  without intent/plan  Homicidal Thoughts:  Yes.  without intent/plan  Memory:  Immediate;   Fair Recent;   Fair Remote;   Fair  Judgement:  Fair  Insight:  Present and Shallow  Psychomotor Activity:  Restlessness  Concentration:  Poor  Recall:  Poor  Fund of Knowledge:NA  Language: Fair  Akathisia:  No  Handed:    AIMS (if indicated):     Assets:  Desire for Improvement  Sleep:  Number of Hours: 6.5   Musculoskeletal: Strength & Muscle Tone: within normal limits Gait & Station: normal Patient leans: N/A  Current Medications: Current Facility-Administered Medications  Medication Dose Route Frequency Provider Last Rate Last Dose  . acetaminophen (TYLENOL) tablet 650 mg  650 mg Oral Q6H PRN Benjaman PottGerald D Taylor, MD   650 mg at 05/09/13 [id number]  . albuterol (PROVENTIL) (2.5 MG/3ML) 0.083% nebulizer solution 2.5 mg  2.5 mg Nebulization Q4H PRN Benjaman PottGerald D Taylor, MD      . alum & mag hydroxide-simeth (MAALOX/MYLANTA) 200-200-20 MG/5ML suspension 30 mL  30 mL Oral Q4H PRN Benjaman PottGerald D Taylor, MD      . benazepril (LOTENSIN) tablet 10 mg  10 mg Oral Daily Sanjuana KavaAgnes I Nwoko, NP  10 mg at 05/12/13 0856  . citalopram (CELEXA) tablet 10 mg  10 mg Oral Daily Nicholaus Bloom, MD   10 mg at 05/12/13 0856  . feeding supplement (ENSURE COMPLETE) (ENSURE COMPLETE) liquid 237 mL  237 mL Oral BID BM Darrol Jump, RD   237 mL at 05/12/13 1051  . fluticasone (FLOVENT HFA) 44 MCG/ACT inhaler 1 puff  1 puff Inhalation BID Clarene Reamer, MD   1 puff at 05/12/13 [id number]  . ibuprofen (ADVIL,MOTRIN) tablet 600 mg  600 mg Oral Q8H PRN  Clarene Reamer, MD      . magnesium hydroxide (MILK OF MAGNESIA) suspension 30 mL  30 mL Oral Daily PRN Clarene Reamer, MD      . multivitamin with minerals tablet 1 tablet  1 tablet Oral Daily Clarene Reamer, MD   1 tablet at 05/12/13 0856  . nicotine (NICODERM CQ - dosed in mg/24 hours) patch 21 mg  21 mg Transdermal Daily Clarene Reamer, MD   21 mg at 05/12/13 0857  . QUEtiapine (SEROQUEL) tablet 100 mg  100 mg Oral QHS Nicholaus Bloom, MD   100 mg at 05/11/13 2102  . QUEtiapine (SEROQUEL) tablet 50 mg  50 mg Oral BID Nicholaus Bloom, MD   50 mg at 05/12/13 0856  . thiamine (B-1) injection 100 mg  100 mg Intramuscular Once Clarene Reamer, MD      . thiamine (VITAMIN B-1) tablet 100 mg  100 mg Oral Daily Clarene Reamer, MD   100 mg at 05/12/13 0856  . traZODone (DESYREL) tablet 100 mg  100 mg Oral QHS,MR X 1 Nicholaus Bloom, MD   100 mg at 05/11/13 2102    Lab Results:  Results for orders placed during the hospital encounter of 05/08/13 (from the past 48 hour(s))  HEMOGLOBIN A1C     Status: None   Collection Time    05/11/13  7:30 PM      Result Value Ref Range   Hemoglobin A1C 5.4  <5.7 %   Comment: (NOTE)                                                                               According to the ADA Clinical Practice Recommendations for 2011, when     HbA1c is used as a screening test:      >=6.5%   Diagnostic of Diabetes Mellitus               (if abnormal result is confirmed)     5.7-6.4%   Increased risk of developing Diabetes Mellitus     References:Diagnosis and Classification of Diabetes Mellitus,Diabetes     GBTD,1761,60(VPXTG 1):S62-S69 and Standards of Medical Care in             Diabetes - 2011,Diabetes Care,2011,34 (Suppl 1):S11-S61.   Mean Plasma Glucose 108  <117 mg/dL   Comment: Performed at [facility]    Physical Findings: AIMS: Facial and Oral Movements Muscles of Facial Expression: None, normal Lips and Perioral Area: None, normal Jaw: None,  normal Tongue: None, normal,Extremity Movements Upper (arms, wrists, hands, fingers): None, normal Lower (legs, knees, ankles, toes):  None, normal, Trunk Movements Neck, shoulders, hips: None, normal, Overall Severity Severity of abnormal movements (highest score from questions above): None, normal Incapacitation due to abnormal movements: None, normal Patient's awareness of abnormal movements (rate only patient's report): No Awareness, Dental Status Current problems with teeth and/or dentures?: No Does patient usually wear dentures?: No  CIWA:  CIWA-Ar Total: 0 COWS:  COWS Total Score: 4  Treatment Plan Summary: Daily contact with patient to assess and evaluate symptoms and progress in treatment Medication management  Plan: Supportive approach/coping skills/relapse prevention Obtain HGBA1C for elevated glucose levels Continue Seroquel 50 mg BID, 100 mg HS Initiate Benazepril  20 mg once, and 10 mg daily starting 05/12/13 for HTN. Continue current plan of care     Continue Maalox for GI upset.   Medical Decision Making Problem Points:  Established problem, worsening (2) and Review of psycho-social stressors (1) Data Points:  Review or order clinical lab tests (1) Review of medication regiment & side effects (2) Review of new medications or change in dosage (2)  I certify that inpatient services furnished can reasonably be expected to improve the patient's condition.   Nanci Pina, PMHNP-BC 05/12/2013, 1:29 PM

## 2013-05-12 NOTE — Progress Notes (Signed)
Despite invitation by the Writer, Pt opted not to attend the evening AA group.

## 2013-05-12 NOTE — Progress Notes (Signed)
05-12-13  NSG NOTE  7a-3p  D: Affect is flat and depressed.  Mood is depressed.  Behavior is cooperative with encouragement, direction and support, requires occasional redirection.  Interacts appropriately with peers and staff with direction.  Participated in RN group, counselor lead group.   Reports fair sleep and improving appetite.  States energy level is low and poor ability to to maintain attention. Reports being tired and wanting to speak to MD about poor sleep.  Identified changes that  would have to be made post discharge.  A:  Medications per MD order.  Support given throughout day.  1:1 time spent with pt.  R:  Following treatment plan. Passive SI.  Denies HI, auditory or visual hallucinations.  Contracts for safety.

## 2013-05-12 NOTE — Progress Notes (Signed)
Pt found in bed at start of shift where he has remained all evening except for meds. States he's too tired for groups because of sedation from seroquel. No physical complaints. Denying AVH however endorses passive SI - "I just think about not being here anymore. No being alive." Also admits to thoughts of harm toward male who spoke negatively of his mother. Pt medicated per orders. Support offered. Encouraged to attend group but pt refuses. He remains safe, resting in bed at this time. Jamie Kato

## 2013-05-12 NOTE — BHH Group Notes (Signed)
[place] Group Notes: (Clinical Social Work)   05/12/2013      Type of Therapy:  Group Therapy   Participation Level:  Did Not Attend    Selmer Dominion, LCSW 05/12/2013, 12:40 PM

## 2013-05-12 NOTE — BHH Group Notes (Signed)
[place] Group Notes:  (Nursing/MHT/Case Management/Adjunct)  Date:  05/12/2013  Time:  5:01 PM  Type of Therapy:  Psychoeducational Skills  Participation Level:  Did Not Attend  Participation Quality:  Did not attend  Affect:  Did not attend  Cognitive:  Did not attend  Insight:  None  Engagement in Group:  None  Modes of Intervention:  Did not attend  Summary of Progress/Problems:  John [name] 05/12/2013, 5:01 PM

## 2013-05-12 NOTE — Progress Notes (Addendum)
Pt did not attend AA group this evening.  

## 2013-05-13 NOTE — BHH Group Notes (Signed)
[place] Group Notes:  (Nursing/MHT/Case Management/Adjunct)  Date:  05/13/2013  Time:  1:24 PM  Type of Therapy:  Psychoeducational Skills  Participation Level:  Did Not Attend  Participation Quality:  Did not attend  Affect:  Did not attend  Cognitive:  Did not attend  Insight:  None  Engagement in Group:  None  Modes of Intervention:  Did not attend  Summary of Progress/Problems: Did not attend despite staff encouragement  Orlin Hilding 05/13/2013, 1:24 PM

## 2013-05-13 NOTE — Progress Notes (Signed)
Patient did not attend the evening speaker [place] meeting. Pt went to his room when group began and remained in bed during group time.

## 2013-05-13 NOTE — BHH Group Notes (Signed)
[place] Group Notes:  (Nursing/MHT/Case Management/Adjunct)  Date:  05/13/2013  Time:  1:23 PM  Type of Therapy:  Psychoeducational Skills  Participation Level:  Did Not Attend  Participation Quality:  Did not attend  Affect:  Did not attend  Cognitive:  Did not attend  Insight:  None  Engagement in Group:  None  Modes of Intervention:  Did not attend  Summary of Progress/Problems: Did not attend despite staff encouragement  Orlin Hilding 05/13/2013, 1:23 PM

## 2013-05-13 NOTE — Progress Notes (Signed)
Adult Psychoeducational Group Note  Date:  05/13/2013 Time:  6:47 PM  Group Topic/Focus:  Coping With Mental Health Crisis:   The purpose of this group is to help patients identify strategies for coping with mental health crisis.  Group discusses possible causes of crisis and ways to manage them effectively.  Participation Level:  Did Not Attend Additional Comments:  Patient did not want to get out of bed.   Leanna Battles 05/13/2013, 6:47 PM

## 2013-05-13 NOTE — BHH Group Notes (Addendum)
[place] Group Notes: (Clinical Social Work)   05/13/2013      Type of Therapy:  Group Therapy   Participation Level:  Did Not Attend    Selmer Dominion, LCSW 05/13/2013, 12:43 PM

## 2013-05-13 NOTE — Progress Notes (Signed)
Adult Psychoeducational Group Note  Date:  05/13/2013 Time:  0900  Group Topic/Focus:  Making Healthy Choices:   The focus of this group is to help patients identify negative/unhealthy choices they were using prior to admission and identify positive/healthier coping strategies to replace them upon discharge.  Participation Level:  Did Not Attend   Hoyt Koch Gayle Collard Leanna Sato 05/13/2013, 2:19 PM

## 2013-05-13 NOTE — Progress Notes (Signed)
Patient ID: John [name], male   DOB: 1971/03/05, 43 y.o.   MRN: [id number] Client stated he is having suicidal thoughts with a plan to use a sheet to hang himself in his room. He feels unsafe. Instructed him to sit near the nurses station until thoughts subside. Will look at PRNs and hand off safety concerns to on-coming nurse.

## 2013-05-13 NOTE — Progress Notes (Signed)
05-13-13 NSG NOTE 7a-3p D: Affect is flat and depressed. Mood is depressed. Behavior is cooperative with encouragement, direction and support, requires occasional redirection. Interacts appropriately with peers and staff with direction, but has tendency to isolate. Did not attend RN group or counselor lead group.  Focused on sleeping and continuing to get his seroquel.   Reports fair sleep and improving appetite. States energy level is low and poor ability to to maintain attention. Identified changes that would have to be made post discharge. A: Medications per MD order. Support given throughout day. 1:1 time spent with pt. R: Following treatment plan. Reports passive SI.  Denies HI, auditory or visual hallucinations. Contracts for safety.

## 2013-05-13 NOTE — Progress Notes (Signed)
Patient ID: John [name], male   DOB: 12/26/1970, 43 y.o.   MRN: [id number] [facility] MD Progress Note  05/13/2013 10:44 AM John [name]  MRN:  [id number]  Subjective:  [name] says, "I still very suicidal, if I could just find a hook I could hang myself."He continues to endorse increased depression rates it at 10/10, anxiety 2/10. He continues to endorse suicidal ideations and homicidal ideations towards John [name] who made bad comment about his mama. Im still hearing voices, and seeing pink elephants, and I still want to kill John [name] "he was talking crooked out his neck about my momma to get back at me". He is able to contract for safety. Reports sleeping well, Thank god for that seroquel". Reports not attending groupd at this time, because Seroquel has him not knocked out.    Diagnosis:   DSM5: Schizophrenia Disorders:  none Obsessive-Compulsive Disorders:  none Trauma-Stressor Disorders:  Posttraumatic Stress Disorder (309.81) Substance/Addictive Disorders:  Alcohol Related Disorder - Severe (303.90) Depressive Disorders:  Major Depressive Disorder - with Psychotic Features (296.24) Total Time spent with patient: 30 minutes  Axis I: Psychotic Disorder NOS  ADL's:  Impaired  Sleep: Good  Appetite:  Fair  Suicidal Ideation: "Yes" Plan:  denies Intent:  denies Means:  denies  Homicidal Ideation: "Yes" Plan:  denies Intent:  denies Means:  denies  AEB (as evidenced by): Per patient's report.  Psychiatric Specialty Exam: Physical Exam  Psychiatric: His speech is normal and behavior is normal. His mood appears anxious. Cognition and memory are normal. He expresses impulsivity. He exhibits a depressed mood. He expresses homicidal and suicidal ideation. He expresses no suicidal plans and no homicidal plans.    ROS   Blood pressure 134/98, pulse 91, temperature 97.4 F (36.3 C), temperature source Oral, resp. rate 20, height 5' 8.5" (1.74 m), weight 83.915 kg (185 lb).Body  mass index is 27.72 kg/(m^2).  General Appearance: Disheveled  Eye Contact::  None  Speech:  Slow and not spontaneous  Volume:  Decreased  Mood:  Anxious, Depressed and worried  Affect:  Restricted  Thought Process:  Coherent and Goal Directed  Orientation:  Other:  person, place  Thought Content:  no spontanoues content, answer questions, reserved, guarded, pos of auditory hallucinations  Suicidal Thoughts:  Yes.  without intent/plan  Homicidal Thoughts:  Yes.  without intent/plan  Memory:  Immediate;   Fair Recent;   Fair Remote;   Fair  Judgement:  Fair  Insight:  Present and Shallow  Psychomotor Activity:  Restlessness  Concentration:  Poor  Recall:  Poor  Fund of Knowledge:NA  Language: Fair  Akathisia:  No  Handed:    AIMS (if indicated):     Assets:  Desire for Improvement  Sleep:  Number of Hours: 6.75   Musculoskeletal: Strength & Muscle Tone: within normal limits Gait & Station: normal Patient leans: N/A  Current Medications: Current Facility-Administered Medications  Medication Dose Route Frequency Provider Last Rate Last Dose  . acetaminophen (TYLENOL) tablet 650 mg  650 mg Oral Q6H PRN John Reamer, MD   650 mg at 05/09/13 6144  . albuterol (PROVENTIL) (2.5 MG/3ML) 0.083% nebulizer solution 2.5 mg  2.5 mg Nebulization Q4H PRN John Reamer, MD      . alum & mag hydroxide-simeth (MAALOX/MYLANTA) 200-200-20 MG/5ML suspension 30 mL  30 mL Oral Q4H PRN John Reamer, MD   30 mL at 05/12/13 1338  . benazepril (LOTENSIN) tablet 10 mg  10 mg Oral Daily John Dicker  Nwoko, NP   10 mg at 05/13/13 0741  . citalopram (CELEXA) tablet 10 mg  10 mg Oral Daily John Bloom, MD   10 mg at 05/13/13 2993  . feeding supplement (ENSURE COMPLETE) (ENSURE COMPLETE) liquid 237 mL  237 mL Oral BID John [name], RD   237 mL at 05/12/13 1441  . fluticasone (FLOVENT HFA) 44 MCG/ACT inhaler 1 puff  1 puff Inhalation BID John Reamer, MD   1 puff at 05/13/13 0741  . ibuprofen  (ADVIL,MOTRIN) tablet 600 mg  600 mg Oral Q8H PRN John Reamer, MD      . magnesium hydroxide (MILK OF MAGNESIA) suspension 30 mL  30 mL Oral Daily PRN John Reamer, MD      . multivitamin with minerals tablet 1 tablet  1 tablet Oral Daily John Reamer, MD   1 tablet at 05/13/13 0741  . nicotine (NICODERM CQ - dosed in mg/24 hours) patch 21 mg  21 mg Transdermal Daily John Reamer, MD   21 mg at 05/13/13 0742  . QUEtiapine (SEROQUEL) tablet 100 mg  100 mg Oral QHS John Bloom, MD   100 mg at 05/12/13 2204  . QUEtiapine (SEROQUEL) tablet 50 mg  50 mg Oral BID John Bloom, MD   50 mg at 05/13/13 0743  . thiamine (B-1) injection 100 mg  100 mg Intramuscular Once John Reamer, MD      . thiamine (VITAMIN B-1) tablet 100 mg  100 mg Oral Daily John Reamer, MD   100 mg at 05/13/13 0742  . traZODone (DESYREL) tablet 100 mg  100 mg Oral QHS,MR X 1 John Bloom, MD   100 mg at 05/12/13 2204    Lab Results:  Results for orders placed during the hospital encounter of 05/08/13 (from the past 48 hour(s))  HEMOGLOBIN A1C     Status: None   Collection Time    05/11/13  7:30 PM      Result Value Ref Range   Hemoglobin A1C 5.4  <5.7 %   Comment: (NOTE)                                                                               According to the ADA Clinical Practice Recommendations for 2011, when     HbA1c is used as a screening test:      >=6.5%   Diagnostic of Diabetes Mellitus               (if abnormal result is confirmed)     5.7-6.4%   Increased risk of developing Diabetes Mellitus     References:Diagnosis and Classification of Diabetes Mellitus,Diabetes     ZJIR,6789,38(BOFBP 1):S62-S69 and Standards of Medical Care in             Diabetes - 2011,Diabetes Care,2011,34 (Suppl 1):S11-S61.   Mean Plasma Glucose 108  <117 mg/dL   Comment: Performed at [facility]    Physical Findings: AIMS: Facial and Oral Movements Muscles of Facial Expression: None, normal Lips  and Perioral Area: None, normal Jaw: None, normal Tongue: None, normal,Extremity Movements Upper (arms, wrists, hands, fingers): None, normal Lower (  legs, knees, ankles, toes): None, normal, Trunk Movements Neck, shoulders, hips: None, normal, Overall Severity Severity of abnormal movements (highest score from questions above): None, normal Incapacitation due to abnormal movements: None, normal Patient's awareness of abnormal movements (rate only patient's report): No Awareness, Dental Status Current problems with teeth and/or dentures?: No Does patient usually wear dentures?: No  CIWA:  CIWA-Ar Total: 0 COWS:  COWS Total Score: 4  Treatment Plan Summary: Daily contact with patient to assess and evaluate symptoms and progress in treatment Medication management  Plan: Supportive approach/coping skills/relapse prevention Obtain HGBA1C for elevated glucose levels Continue Seroquel 50 mg BID, 100 mg HS Initiate Benazepril  20 mg once, and 10 mg daily starting 05/12/13 for HTN. Continue current plan of care     Continue Maalox for GI upset.   Medical Decision Making Problem Points:  Established problem, worsening (2) and Review of psycho-social stressors (1) Data Points:  Review or order clinical lab tests (1) Review of medication regiment & side effects (2) Review of new medications or change in dosage (2)  I certify that inpatient services furnished can reasonably be expected to improve the patient's condition.   Nanci Pina, PMHNP-BC 05/13/2013, 10:44 AM

## 2013-05-13 NOTE — Progress Notes (Signed)
Pt assessed and states the command AH to tie a sheet around his neck have passed. He is able to contract for safety now and assures this Probation officer that should the urges return he will notify staff. He attended AA and has been visible in the milieu. Medicated per orders. Remains flat, depressed but cooperative. Will continue monitor closely. Denies HI/VH at this time. Jamie Kato

## 2013-05-14 MED ORDER — QUETIAPINE FUMARATE 50 MG PO TABS
150.0000 mg | ORAL_TABLET | Freq: Every day | ORAL | Status: DC
  Administered 2013-05-14 – 2013-05-15 (×2): 150 mg via ORAL
  Filled 2013-05-14 (×3): qty 3

## 2013-05-14 MED ORDER — QUETIAPINE FUMARATE 100 MG PO TABS
100.0000 mg | ORAL_TABLET | Freq: Two times a day (BID) | ORAL | Status: DC
  Administered 2013-05-14 – 2013-05-15 (×3): 100 mg via ORAL
  Filled 2013-05-14 (×2): qty 1
  Filled 2013-05-14: qty 49
  Filled 2013-05-14 (×2): qty 1
  Filled 2013-05-14: qty 49
  Filled 2013-05-14 (×2): qty 1

## 2013-05-14 NOTE — Progress Notes (Signed)
Adult Psychoeducational Group Note  Date:  05/14/2013 Time: 8:00 PM  Group Topic/Focus:  AA  Participation Level:  Did not attend  Participation Quality:    Affect:    Cognitive:    Insight:   Engagement in Group:    Modes of Intervention:    Additional Comments:  Pt did not attend.  John [name] [name] 05/14/2013, 10:35 PM

## 2013-05-14 NOTE — BHH Group Notes (Signed)
[facility] LCSW Group Therapy  05/14/2013 3:40 PM  Type of Therapy:  Group Therapy  Participation Level:  Did Not Attend-pt in bed resting/refused to attend afternoon group.   Smart, Linzie Criss LCSWA  05/14/2013, 3:40 PM

## 2013-05-14 NOTE — Progress Notes (Signed)
Patient ID: John [name], male   DOB: [birth year], 43 y.o.   MRN: [id number]  D: Patient has a flat affect on approach but brightens up when around other peers. Patient c/o of hearing voices last night to put sheet around neck. Continues to report hearing voices at times but does not appear to be responding to any internal stimuli and does not appear psychotic. Reports depression "3" and feelings of hopelessness at a "2". Continues to have some passive SI but contracts. A: Staff will continue to monitor on q 15 minute checks, follow treatment plan, and give meds as ordered. R: Cooperative on unit

## 2013-05-14 NOTE — BHH Group Notes (Signed)
[facility] LCSW Aftercare Discharge Planning Group Note   05/14/2013 10:31 AM  Participation Quality:  DID NOT ATTEND-pt in room sleeping/refused to attend group.   Smart, [employer]

## 2013-05-14 NOTE — Tx Team (Signed)
Interdisciplinary Treatment Plan Update (Adult)  Date: 05/14/2013   Time Reviewed: 11:02 AM  Progress in Treatment:  Attending groups: Intermittently  Participating in groups: No.   Taking medication as prescribed: Yes  Tolerating medication: Yes  Family/Significant othe contact made: No. Pt refused to consent to family contact. SPE completed with pt.   Patient understands diagnosis: Yes, AEB seeking treatment for SI/HI toward his partner, Mood stabilization, ETOH detox, and medication management for mood stabilization and elimination of AVH.  Discussing patient identified problems/goals with staff: Yes  Medical problems stabilized or resolved: Yes  Denies suicidal/homicidal ideation: Passive SI reported by pt this morning. Able to contract for safety.  Patient has not harmed self or Others: Yes  New problem(s) identified: pt not currently attending groups.  Discharge Plan or Barriers: Pt not attending d/c planning groups at this time. Pt must get Id prior to admission into [facility] on Thursday and needs at least two days to do this. He plans to return to shelter while completing this task. He plans to follow up at [facility] for med management.  Additional comments: n/a        Reason for Continuation of Hospitalization: SI-passive Mood stabilization Medication management  Estimated length of stay:1-2 days  For review of initial/current patient goals, please see plan of care.  Attendees:  Patient:    Family:    Physician: Carlton Adam MD 05/14/2013 11:02 AM   Nursing: Jan RN 05/14/2013 11:02 AM   Clinical Social Worker [place], [place]  05/14/2013 11:02 AM   Other: Butch Penny RN  05/14/2013 11:02 AM   Other: Hardie Pulley. PA 05/14/2013 11:02 AM   Other: Gerline Legacy Nurse CM  05/14/2013 11:02 AM   Other: Oletta Darter. RN 05/14/2013 11:04 AM   Scribe for Treatment Team:  Nira Conn Smart LCSWA 05/14/2013 11:02 AM

## 2013-05-14 NOTE — Progress Notes (Signed)
[facility] MD Progress Note  05/14/2013 4:58 PM John [name]  MRN:  [id number] Subjective:  States he is still hearing some voices. He has heard them when in between being asleep and awake. He endorses that he has some anxiety during the day. States he is committed to make this work. He has an appointment with [facility] on Thursday Diagnosis:   DSM5: Schizophrenia Disorders:  denies Obsessive-Compulsive Disorders:  denies Trauma-Stressor Disorders:  Posttraumatic Stress Disorder (309.81) Substance/Addictive Disorders:  Alcohol Related Disorder - Severe (303.90) Depressive Disorders:  Major Depressive Disorder - with Psychotic Features (296.24) Total Time spent with patient: 30 minutes  Axis I: Substance Induced Mood Disorder  ADL's:  Intact  Sleep: Poor  Appetite:  Fair  Suicidal Ideation:  Plan:  denies Intent:  denies Homicidal Ideation:  Plan:  denies Intent:  denies Means:  denies AEB (as evidenced by):  Psychiatric Specialty Exam: Physical Exam  Review of Systems  Constitutional: Negative.   HENT: Negative.   Eyes: Negative.   Respiratory: Negative.   Cardiovascular: Negative.   Gastrointestinal: Negative.   Genitourinary: Negative.   Musculoskeletal: Negative.   Skin: Negative.   Neurological: Negative.   Endo/Heme/Allergies: Negative.   Psychiatric/Behavioral: Positive for depression, hallucinations and substance abuse.    Blood pressure 131/88, pulse 90, temperature 98.2 F (36.8 C), temperature source Oral, resp. rate 22, height 5' 8.5" (1.74 m), weight 83.915 kg (185 lb).Body mass index is 27.72 kg/(m^2).  General Appearance: Disheveled  Eye Sport and exercise psychologist::  Fair  Speech:  Clear and Coherent  Volume:  Decreased  Mood:  Anxious and worried  Affect:  anxiois, worried  Thought Process:  Coherent and Goal Directed  Orientation:  Full (Time, Place, and Person)  Thought Content:  symtpoms, worries, concerns  Suicidal Thoughts:  No  Homicidal Thoughts:  No  Memory:   Immediate;   Fair Recent;   Fair Remote;   Fair  Judgement:  Fair  Insight:  Present and Shallow  Psychomotor Activity:  Restlessness  Concentration:  Fair  Recall:  [employer] of Knowledge:NA  Language: Fair  Akathisia:  No  Handed:    AIMS (if indicated):     Assets:  Desire for Improvement  Sleep:  Number of Hours: 6   Musculoskeletal: Strength & Muscle Tone: within normal limits Gait & Station: normal Patient leans: N/A  Current Medications: Current Facility-Administered Medications  Medication Dose Route Frequency Provider Last Rate Last Dose  . acetaminophen (TYLENOL) tablet 650 mg  650 mg Oral Q6H PRN Clarene Reamer, MD   650 mg at 05/09/13 4193  . albuterol (PROVENTIL) (2.5 MG/3ML) 0.083% nebulizer solution 2.5 mg  2.5 mg Nebulization Q4H PRN Clarene Reamer, MD      . alum & mag hydroxide-simeth (MAALOX/MYLANTA) 200-200-20 MG/5ML suspension 30 mL  30 mL Oral Q4H PRN Clarene Reamer, MD   30 mL at 05/12/13 1338  . benazepril (LOTENSIN) tablet 10 mg  10 mg Oral Daily Encarnacion Slates, NP   10 mg at 05/14/13 0808  . citalopram (CELEXA) tablet 10 mg  10 mg Oral Daily Nicholaus Bloom, MD   10 mg at 05/14/13 7902  . feeding supplement (ENSURE COMPLETE) (ENSURE COMPLETE) liquid 237 mL  237 mL Oral BID BM Darrol Jump, RD   237 mL at 05/13/13 1445  . fluticasone (FLOVENT HFA) 44 MCG/ACT inhaler 1 puff  1 puff Inhalation BID Clarene Reamer, MD   1 puff at 05/14/13 0810  . ibuprofen (ADVIL,MOTRIN) tablet  600 mg  600 mg Oral Q8H PRN Clarene Reamer, MD      . magnesium hydroxide (MILK OF MAGNESIA) suspension 30 mL  30 mL Oral Daily PRN Clarene Reamer, MD      . multivitamin with minerals tablet 1 tablet  1 tablet Oral Daily Clarene Reamer, MD   1 tablet at 05/14/13 0810  . nicotine (NICODERM CQ - dosed in mg/24 hours) patch 21 mg  21 mg Transdermal Daily Clarene Reamer, MD   21 mg at 05/14/13 0808  . QUEtiapine (SEROQUEL) tablet 100 mg  100 mg Oral BID Nicholaus Bloom, MD   100 mg at  05/14/13 1639  . QUEtiapine (SEROQUEL) tablet 150 mg  150 mg Oral QHS Nicholaus Bloom, MD      . thiamine (B-1) injection 100 mg  100 mg Intramuscular Once Clarene Reamer, MD      . thiamine (VITAMIN B-1) tablet 100 mg  100 mg Oral Daily Clarene Reamer, MD   100 mg at 05/14/13 1749  . traZODone (DESYREL) tablet 100 mg  100 mg Oral QHS,MR X 1 Nicholaus Bloom, MD   100 mg at 05/13/13 2116    Lab Results: No results found for this or any previous visit (from the past 48 hour(s)).  Physical Findings: AIMS: Facial and Oral Movements Muscles of Facial Expression: None, normal Lips and Perioral Area: None, normal Jaw: None, normal Tongue: None, normal,Extremity Movements Upper (arms, wrists, hands, fingers): None, normal Lower (legs, knees, ankles, toes): None, normal, Trunk Movements Neck, shoulders, hips: None, normal, Overall Severity Severity of abnormal movements (highest score from questions above): None, normal Incapacitation due to abnormal movements: None, normal Patient's awareness of abnormal movements (rate only patient's report): No Awareness, Dental Status Current problems with teeth and/or dentures?: No Does patient usually wear dentures?: No  CIWA:  CIWA-Ar Total: 0 COWS:  COWS Total Score: 4  Treatment Plan Summary: Daily contact with patient to assess and evaluate symptoms and progress in treatment Medication management  Plan: Supportive approach/coping skills/relapse prevention           Increase the Seroquel to 100 mg BID, 150 mg HS           CBT;mindfulness  Medical Decision Making Problem Points:  Review of psycho-social stressors (1) Data Points:  Review of new medications or change in dosage (2)  I certify that inpatient services furnished can reasonably be expected to improve the patient's condition.   Dani Wallner A 05/14/2013, 4:58 PM

## 2013-05-14 NOTE — Progress Notes (Signed)
D: Patient  In his room on approach.  Patient was alseep but did wake up to speak to Probation officer.  Patient states his day was ok.  Patient states he has not learned anything.  Patient states he continues to have SI and states he wants to jump off of a bridge or hang self in the shower.  Patient verbally contracts for safety.  Patient states he has HI towards ex boyfriend Herbie Baltimore for taking about his deceased mother.  Patient states he has visual hallucinations seeing pink elephants and little purple people.  Patient calm and cooperative.  Patient rates depression 4 and anxiety 8. A: Staff to monitor Q 15 mins for safety.  Encouragement and support offered.  Scheduled medications administered per orders. R: Patient remains safe on the unit.  Patient did not attend group tonight but was visible on the unit after group.  Patient taking administered medications.

## 2013-05-15 DIAGNOSIS — F1994 Other psychoactive substance use, unspecified with psychoactive substance-induced mood disorder: Secondary | ICD-10-CM

## 2013-05-15 MED ORDER — BECLOMETHASONE DIPROPIONATE 80 MCG/ACT IN AERS: 1.0000 | INHALATION_SPRAY | Freq: Two times a day (BID) | RESPIRATORY_TRACT | Status: DC

## 2013-05-15 MED ORDER — QUETIAPINE FUMARATE 100 MG PO TABS: ORAL_TABLET | ORAL | Status: DC

## 2013-05-15 MED ORDER — CITALOPRAM HYDROBROMIDE 10 MG PO TABS: 10.0000 mg | ORAL_TABLET | Freq: Every day | ORAL | Status: DC

## 2013-05-15 MED ORDER — ALBUTEROL SULFATE HFA 108 (90 BASE) MCG/ACT IN AERS: 2.0000 | INHALATION_SPRAY | RESPIRATORY_TRACT | Status: DC | PRN

## 2013-05-15 MED ORDER — TRAZODONE HCL 100 MG PO TABS: 100.0000 mg | ORAL_TABLET | Freq: Every evening | ORAL | Status: DC | PRN

## 2013-05-15 MED ORDER — BENAZEPRIL HCL 10 MG PO TABS: 10.0000 mg | ORAL_TABLET | Freq: Every day | ORAL | Status: DC

## 2013-05-15 NOTE — Progress Notes (Signed)
D: Patient in the dayroom on approach.  Patient Patient states he is feeling better.  Patient states he is ready to be discharged to [facility] in the morning.  Patient states his feelings have changed towards his ex boyfriend Herbie Baltimore.  Patient denies homicidal thoughts towards Robert.  Patient states there is a possibility he may see Herbie Baltimore at work but Fish farm manager is going to make sure they are not working together.  Patient denies AVH also.     A: Staff to monitor Q 15 mins for safety.  Encouragement and support offered.  Scheduled medications administered per orders. R: Patient remains safe on the unit.  Patient attended group tonight.  Patient visible on the unit and interacting with peers.  Patient taking administered medications.

## 2013-05-15 NOTE — Progress Notes (Signed)
Recreation Therapy Notes  Animal-Assisted Activity/Therapy (AAA/T) Program Checklist/Progress Notes Patient Eligibility Criteria Checklist & Daily Group note for Rec Tx Intervention  Date: 03.03.2015 Time: 2:45pm Location: [address]    AAA/T Program Assumption of Risk Form signed by Patient/ or Parent Legal Guardian yes  Patient is free of allergies or sever asthma yes  Patient reports no fear of animals yes  Patient reports no history of cruelty to animals yes   Patient understands his/her participation is voluntary yes  Behavioral Response: Did not attend.    Laureen Ochs Ligia Duguay, LRT/CTRS  [name] 05/15/2013 4:45 PM

## 2013-05-15 NOTE — Progress Notes (Signed)
Orientation Group - DID NOT ATTEND  The focus of this group is to educate the patient on the purpose and policies of crisis stabilization and provide a format to answer questions about their admission.  The group details unit policies and expectations of patients while admitted. 

## 2013-05-15 NOTE — BHH Group Notes (Signed)
[facility] LCSW Group Therapy  05/15/2013 3:00 PM  Type of Therapy:  Group Therapy  Participation Level:  Did Not Attend-pt in room sleeping/refused to attend group.   Smart, Alto Gandolfo LCSWA  05/15/2013, 3:00 PM

## 2013-05-15 NOTE — Progress Notes (Addendum)
D: Patient denies SI/HI and visual hallucinations. The patient reports having auditory hallucinations and states that he hears "whispers of people talking." The patient has a depressed mood and affect. The patient is isolating to his room throughout the shift. The patient reports no detox symptoms at this time.  A: Patient given emotional support from RN. Patient encouraged to come to staff with concerns and/or questions. Patient's medication routine continued. Patient's orders and plan of care reviewed.  R: Patient remains cooperative. Will continue to monitor patient q15 minutes for safety.

## 2013-05-15 NOTE — Discharge Summary (Signed)
Physician Discharge Summary Note  Patient:  John [name] is an 43 y.o., male MRN:  [id number] DOB:  04/08/1970 Patient phone:  [phone] (home)  Patient address:   [place] [address],  Total Time spent with patient: Greater than 30 minutes  Date of Admission:  05/08/2013  Date of Discharge: 05/15/13  Reason for Admission:  Alcohol detox  Discharge Diagnoses: Active Problems:   Alcohol dependence   Psychiatric Specialty Exam: Physical Exam  Constitutional: He is oriented to person, place, and time. He appears well-developed.  HENT:  Head: Normocephalic.  Eyes: Pupils are equal, round, and reactive to light.  Cardiovascular: Normal rate.   Respiratory: Effort normal.  Genitourinary:  Denies any issues in this area  Musculoskeletal: Normal range of motion.  Neurological: He is alert and oriented to person, place, and time.  Skin: Skin is warm and dry.  Psychiatric: His speech is normal and behavior is normal. Judgment and thought content normal. His mood appears not anxious. His affect is not angry. Cognition and memory are normal. He does not exhibit a depressed mood.    Review of Systems  Constitutional: Negative.   Eyes: Negative.   Cardiovascular: Negative.   Gastrointestinal: Negative.   Genitourinary: Negative.   Musculoskeletal: Negative.   Skin: Negative.   Neurological: Negative.   Endo/Heme/Allergies: Negative.   Psychiatric/Behavioral: Positive for depression (Stable) and substance abuse (Alcoholism). Negative for suicidal ideas, hallucinations and memory loss. The patient has insomnia (Stable). The patient is not nervous/anxious.     Blood pressure 130/84, pulse 83, temperature 97.6 F (36.4 C), temperature source Oral, resp. rate 24, height 5' 8.5" (1.74 m), weight 83.915 kg (185 lb).Body mass index is 27.72 kg/(m^2).  General Appearance: Casual and Fairly Groomed  Engineer, water::  Good  Speech:  Clear and Coherent  Volume:   Normal  Mood:  Stable  Affect:  Appropriate and Congruent  Thought Process:  Coherent and Goal Directed  Orientation:  Full (Time, Place, and Person)  Thought Content:  Denies any psychotic symptoms  Suicidal Thoughts:  No  Homicidal Thoughts:  No  Memory:  Immediate;   Good Recent;   Good Remote;   Good  Judgement:  Intact  Insight:  Present  Psychomotor Activity:  Normal  Concentration:  Good  Recall:  Good  Fund of Knowledge:Fair  Language: Good  Akathisia:  No  Handed:  Right  AIMS (if indicated):     Assets:  Desire for Improvement  Sleep:  Number of Hours: 5.5    Past Psychiatric History: Diagnosis:  Hospitalizations:  Outpatient Care:  Substance Abuse Care:  Self-Mutilation:  Suicidal Attempts:  Violent Behaviors:   Musculoskeletal: Strength & Muscle Tone: within normal limits Gait & Station: normal Patient leans: N/A  DSM5: Schizophrenia Disorders:  NA Obsessive-Compulsive Disorders:  NA Trauma-Stressor Disorders:  NA Substance/Addictive Disorders:  Alcohol Related Disorder - Severe (303.90) Depressive Disorders:  Major depression  Axis Diagnosis:   AXIS I:  Alcohol Related Disorder - Severe (303.90), Major depressive disorder AXIS II:  Deferred AXIS III:   Past Medical History  Diagnosis Date  . Asthma   . HIV (human immunodeficiency virus infection)     "dx'd ~ 2 yr ago" (09/29/2012)  . Mental disorder   . Anxiety   . Depression   . Chronic low back pain    AXIS IV:  Alcoholism, chronic AXIS V:  62  Level of Care:  RTC, OP  Hospital Course:  43 Y/O male with history  of Addictions as well as a mood disorder with psychotic features and PTSD who claims that after decreasing his alcohol use for 3 or 4 months, his "Bipolar kicked in" and he started drinking every day about two months ago. He states he is drinkng half a gallon, smokes pot and he also uses cocaine occasionally. States he was diagnosed with HIV in 2013. States that he went straight  to the bottle. He has been having voices that tell him to hurt people. Has felt violent and he is afraid he is going to lose control.  John [name] was admitted to the hospital with blood alcohol level of 133 per toxicology reports, and UDS reports indicated positive cocaine and THC. He was intoxicated requiring detoxification treatments. He received Librium detox protocols. He was also enrolled in group counseling sessions/activities and AA/NA meeting being held on this unit to learn coping skills to help him deal with his addiction and mood instability after discharge. And for his major depressive disorder symptoms, John [name] was ordered and received Citalopram 10 mg daily for depression, Seroquel 100 mg Q bedtime for mood control and Trazodone 100 mg Q bedtime for sleep. He received medication management and monitoring for his other medical issues that he presented. He tolerated his treatment regimen without any significant adverse effects and or reactions.  John [name] has completed detox treatment, and his mood stabilized. This is evidenced by his reports of improved mood and absence of withdrawal symptoms of substances. He is currently being discharged to continue further substance abuse treatment at the [facility] in [place], [place], and for medication management and routine psychiatric care, John [name] will receive these services at the [facility] here in [place], [place]. He has been provided with all the pertinent information required to make these appointments without problems. Upon discharge, John [name] adamantly denies any SIHI, AVH, delusions, paranoia and or withdrawal symptoms. He received from [facility] a 4 days worth supply samples of his [facility] discharge medications. He left [facility] with all personal belongings in no apparent distress. Transportation per [employer]. Bus fare provided.  Consults:  psychiatry  Significant Diagnostic Studies:  labs: CBC with diff, CMP, UDS, toxicology tests, U/A, reports, stable,  no changes  Discharge Vitals:   Blood pressure 130/84, pulse 83, temperature 97.6 F (36.4 C), temperature source Oral, resp. rate 24, height 5' 8.5" (1.74 m), weight 83.915 kg (185 lb). Body mass index is 27.72 kg/(m^2). Lab Results:   No results found for this or any previous visit (from the past 72 hour(s)).  Physical Findings: AIMS: Facial and Oral Movements Muscles of Facial Expression: None, normal Lips and Perioral Area: None, normal Jaw: None, normal Tongue: None, normal,Extremity Movements Upper (arms, wrists, hands, fingers): None, normal Lower (legs, knees, ankles, toes): None, normal, Trunk Movements Neck, shoulders, hips: None, normal, Overall Severity Severity of abnormal movements (highest score from questions above): None, normal Incapacitation due to abnormal movements: None, normal Patient's awareness of abnormal movements (rate only patient's report): No Awareness, Dental Status Current problems with teeth and/or dentures?: No Does patient usually wear dentures?: No  CIWA:  CIWA-Ar Total: 0 COWS:  COWS Total Score: 4  Psychiatric Specialty Exam: See Psychiatric Specialty Exam and Suicide Risk Assessment completed by Attending Physician prior to discharge.  Discharge destination:  Other:  Home, then to [place] on 05/17/13  Is patient on multiple antipsychotic therapies at discharge:  No   Has Patient had three or more failed trials of antipsychotic monotherapy by history:  No  Recommended  Plan for Multiple Antipsychotic Therapies: NA    Medication List       Indication   albuterol 108 (90 BASE) MCG/ACT inhaler  Commonly known as:  PROVENTIL HFA;VENTOLIN HFA  Inhale 2 puffs into the lungs every 4 (four) hours as needed for wheezing or shortness of breath.   Indication:  Asthma     beclomethasone 80 MCG/ACT inhaler  Commonly known as:  QVAR  Inhale 1 puff into the lungs 2 (two) times daily. For Asthma   Indication:  Acute Asthmatic  Bronchitis, Asthma     benazepril 10 MG tablet  Commonly known as:  LOTENSIN  Take 1 tablet (10 mg total) by mouth daily. For high blood pressure   Indication:  High Blood Pressure     citalopram 10 MG tablet  Commonly known as:  CELEXA  Take 1 tablet (10 mg total) by mouth daily. For depression   Indication:  Depression     QUEtiapine 100 MG tablet  Commonly known as:  SEROQUEL  Take 1 tablet (100 mg) twice daily & 1.5 tablet (150 mg) Q bedtime for mood control   Indication:  Mood control     traZODone 100 MG tablet  Commonly known as:  DESYREL  Take 1 tablet (100 mg total) by mouth at bedtime and may repeat dose one time if needed. For sleep   Indication:  Trouble Sleeping       Follow-up Information   Follow up with [facility]. (Walk in between 8am-9am Monday through Friday for hospital followup/medication management. )    Contact information:   [address], [place] [address] Phone: [phone] Fax: [phone]      Follow up with [facility] On 05/17/2013. (Arrive by 8am with [employer], meds, and clothing for screening and possible admission. )    Contact information:   [address] [place], [place] [address] Phone: [phone] Fax: [phone]     Follow-up recommendations: Activity:  As tolerated Diet: As recommended by your primary care doctor. Keep all scheduled follow-up appointments as recommended.  Continue to work your relapse prevention plan Comments: Take all your medications as prescribed by your mental healthcare provider. Report any adverse effects and or reactions from your medicines to your outpatient provider promptly. Patient is instructed and cautioned to not engage in alcohol and or illegal drug use while on prescription medicines. In the event of worsening symptoms, patient is instructed to call the crisis hotline, 911 and or go to the nearest ED for appropriate evaluation and treatment of symptoms. Follow-up with your  primary care provider for your other medical issues, concerns and or health care needs.   Total Discharge Time:  Greater than 30 minutes.  Signed: Encarnacion Slates, PMHNP-BC, FNP-BC 05/15/2013, 3:19 PM Agree with assessment and plan Geralyn Flash A. Sabra Heck, M.D.
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
## 2013-05-15 NOTE — Progress Notes (Signed)
[facility] MD Progress Note  05/15/2013 5:47 PM John [name]  MRN:  [id number] Subjective:  Ebb reports a decrease in the voices. States that even when he hears them he can ignored them. Knows that the voices are symptoms of his illness. He is going to be going to [facility] Thursday. He will need to get a new birth certificate, and other documents to be able to go in. He is planning to get out of here early tomorrow and take care of these things before going to [facility].  Diagnosis:   DSM5: Schizophrenia Disorders:  none Obsessive-Compulsive Disorders:  none Trauma-Stressor Disorders:  Posttraumatic Stress Disorder (309.81) Substance/Addictive Disorders:  Alcohol Related Disorder - Severe (303.90) Depressive Disorders:  Major Depressive Disorder - with Psychotic Features (296.24) Total Time spent with patient: 30 minutes  Axis I: Substance Induced Mood Disorder  ADL's:  Intact  Sleep: Fair  Appetite:  Fair  Suicidal Ideation:  Plan:  denies Intent:  denies Means:  denies Homicidal Ideation:  Plan:  denies Intent:  denies Means:  denies AEB (as evidenced by):  Psychiatric Specialty Exam: Physical Exam  Review of Systems  Constitutional: Negative.   HENT: Negative.   Eyes: Negative.   Respiratory: Negative.   Cardiovascular: Negative.   Gastrointestinal: Negative.   Genitourinary: Negative.   Musculoskeletal: Negative.   Skin: Negative.   Neurological: Negative.   Endo/Heme/Allergies: Negative.   Psychiatric/Behavioral: Positive for hallucinations and substance abuse. The patient is nervous/anxious.     Blood pressure 130/84, pulse 83, temperature 97.6 F (36.4 C), temperature source Oral, resp. rate 24, height 5' 8.5" (1.74 m), weight 83.915 kg (185 lb).Body mass index is 27.72 kg/(m^2).  General Appearance: Fairly Groomed  Engineer, water::  Fair  Speech:  Clear and Coherent  Volume:  Normal  Mood:  Anxious worried  Affect:  Restricted  Thought Process:  Coherent and  Goal Directed  Orientation:  Full (Time, Place, and Person)  Thought Content:  symtpoms, worries, concerns  Suicidal Thoughts:  No  Homicidal Thoughts:  No  Memory:  Immediate;   Fair Recent;   Fair Remote;   Fair  Judgement:  Fair  Insight:  Present and Shallow  Psychomotor Activity:  Decreased  Concentration:  Fair  Recall:  [employer] of Knowledge:NA  Language: Fair  Akathisia:  No  Handed:    AIMS (if indicated):     Assets:  Desire for Improvement  Sleep:  Number of Hours: 5.5   Musculoskeletal: Strength & Muscle Tone: within normal limits Gait & Station: normal Patient leans: N/A  Current Medications: Current Facility-Administered Medications  Medication Dose Route Frequency Provider Last Rate Last Dose  . acetaminophen (TYLENOL) tablet 650 mg  650 mg Oral Q6H PRN Clarene Reamer, MD   650 mg at 05/09/13 3474  . albuterol (PROVENTIL) (2.5 MG/3ML) 0.083% nebulizer solution 2.5 mg  2.5 mg Nebulization Q4H PRN Clarene Reamer, MD      . alum & mag hydroxide-simeth (MAALOX/MYLANTA) 200-200-20 MG/5ML suspension 30 mL  30 mL Oral Q4H PRN Clarene Reamer, MD   30 mL at 05/12/13 1338  . benazepril (LOTENSIN) tablet 10 mg  10 mg Oral Daily Encarnacion Slates, NP   10 mg at 05/15/13 0825  . citalopram (CELEXA) tablet 10 mg  10 mg Oral Daily Nicholaus Bloom, MD   10 mg at 05/15/13 0825  . feeding supplement (ENSURE COMPLETE) (ENSURE COMPLETE) liquid 237 mL  237 mL Oral BID BM Darrol Jump, RD  237 mL at 05/15/13 0828  . fluticasone (FLOVENT HFA) 44 MCG/ACT inhaler 1 puff  1 puff Inhalation BID Clarene Reamer, MD   1 puff at 05/15/13 0825  . ibuprofen (ADVIL,MOTRIN) tablet 600 mg  600 mg Oral Q8H PRN Clarene Reamer, MD      . magnesium hydroxide (MILK OF MAGNESIA) suspension 30 mL  30 mL Oral Daily PRN Clarene Reamer, MD      . multivitamin with minerals tablet 1 tablet  1 tablet Oral Daily Clarene Reamer, MD   1 tablet at 05/15/13 0825  . nicotine (NICODERM CQ - dosed in mg/24  hours) patch 21 mg  21 mg Transdermal Daily Clarene Reamer, MD   21 mg at 05/15/13 0827  . QUEtiapine (SEROQUEL) tablet 100 mg  100 mg Oral BID Nicholaus Bloom, MD   100 mg at 05/15/13 1709  . QUEtiapine (SEROQUEL) tablet 150 mg  150 mg Oral QHS Nicholaus Bloom, MD   150 mg at 05/14/13 2137  . thiamine (B-1) injection 100 mg  100 mg Intramuscular Once Clarene Reamer, MD      . thiamine (VITAMIN B-1) tablet 100 mg  100 mg Oral Daily Clarene Reamer, MD   100 mg at 05/15/13 0825  . traZODone (DESYREL) tablet 100 mg  100 mg Oral QHS,MR X 1 Nicholaus Bloom, MD   100 mg at 05/14/13 2138    Lab Results: No results found for this or any previous visit (from the past 48 hour(s)).  Physical Findings: AIMS: Facial and Oral Movements Muscles of Facial Expression: None, normal Lips and Perioral Area: None, normal Jaw: None, normal Tongue: None, normal,Extremity Movements Upper (arms, wrists, hands, fingers): None, normal Lower (legs, knees, ankles, toes): None, normal, Trunk Movements Neck, shoulders, hips: None, normal, Overall Severity Severity of abnormal movements (highest score from questions above): None, normal Incapacitation due to abnormal movements: None, normal Patient's awareness of abnormal movements (rate only patient's report): No Awareness, Dental Status Current problems with teeth and/or dentures?: No Does patient usually wear dentures?: No  CIWA:  CIWA-Ar Total: 0 COWS:  COWS Total Score: 4  Treatment Plan Summary: Daily contact with patient to assess and evaluate symptoms and progress in treatment Medication management  Plan: Supportive approach/coping skills/relapse prevention           Reassess and address the co morbidities           Optimize treatment with psychotropics           Facilitate admission to [facility] Decision Making Problem Points:  Review of psycho-social stressors (1) Data Points:  Review of medication regiment & side effects (2)  I certify that  inpatient services furnished can reasonably be expected to improve the patient's condition.   Pebble Botkin A 05/15/2013, 5:47 PM

## 2013-05-15 NOTE — Progress Notes (Signed)
Adult Psychoeducational Group Note  Date:  05/15/2013 Time:  9:36 PM  Group Topic/Focus:  Wrap-Up Group:   The focus of this group is to help patients review their daily goal of treatment and discuss progress on daily workbooks.  Participation Level:  Active  Participation Quality:  Appropriate  Affect:  Appropriate  Cognitive:  Alert and Oriented  Insight: Appropriate  Engagement in Group:  Developing/Improving  Modes of Intervention:  Clarification, Exploration and Support  Additional Comments:  Patient reported that he had a good day. Patient stated that he learned from his recovery groups that he wants to stay away from [facility] stores and to attend more meetings.  Dorlis Judice, Patrick North 05/15/2013, 9:36 PM

## 2013-05-16 NOTE — Progress Notes (Signed)
[facility] Adult Case Management Discharge Plan :  Will you be returning to the same living situation after discharge: Yes,  shelter until [facility] tomorrow AM. pt had to d/c early to get required documentation for admission into [facility] At discharge, do you have transportation home?:Yes,  friend Do you have the ability to pay for your medications:Yes,  mental health  Release of information consent forms completed and submitted to Medical Records by CSW.  Patient to Follow up at: Follow-up Information   Follow up with [facility]. (Walk in between 8am-9am Monday through Friday for hospital followup/medication management. )    Contact information:   [address], [place] [address] Phone: [phone] Fax: [phone]      Follow up with [facility] On 05/17/2013. (Arrive by 8am with [employer], meds, and clothing for screening and possible admission. )    Contact information:   [address] [place], [place] [address] Phone: [phone] Fax: [phone]      Patient denies SI/HI:   Yes,  during self report.     Safety Planning and Suicide Prevention discussed:  Yes,  SPE completed with pt. Pt did not endorse SI/HI upon d/c from hospital. PT did not consent to family contact as he could not identify any family supports. SPE completed with pt. SPI pamphlet provided to pt and he was encouraged to share with his support network, ask questions, and talk about any concerns relating to SPE.  Smart, Jatara Huettner LCSWA  05/16/2013, 11:22 AM

## 2013-05-16 NOTE — ED Provider Notes (Signed)
Medical screening examination/treatment/procedure(s) were performed by non-physician practitioner and as supervising physician I was immediately available for consultation/collaboration.   Teressa Lower, MD 05/16/13 2256

## 2013-05-16 NOTE — Progress Notes (Signed)
Patient AVS, RX, bus pass and instructions explained to patient and patient verbalized understanding. Patient belongings given back to him and patient signed belongings sheet.  Patient states he was going to report to [facility] tomorrow after getting his ID information today.  Patient denies SI/HI and denies AVH.  No further monitoring.

## 2013-05-21 NOTE — Progress Notes (Signed)
Patient Discharge Instructions:  After Visit Summary (AVS):   Faxed to:  05/21/13 Discharge Summary Note:   Faxed to:  05/21/13 Psychiatric Admission Assessment Note:   Faxed to:  05/21/13 Suicide Risk Assessment - Discharge Assessment:   Faxed to:  05/21/13 Faxed/Sent to the Next Level Care provider:  05/21/13 Faxed to [facility] @ [phone] Faxed to [facility], 05/21/2013, 3:36 PM

## 2013-05-23 ENCOUNTER — Telehealth: Payer: Self-pay | Admitting: *Deleted

## 2013-05-23 ENCOUNTER — Ambulatory Visit (HOSPITAL_COMMUNITY)
Admission: RE | Admit: 2013-05-23 | Discharge: 2013-05-23 | Disposition: A | Discharge status: Home / Self Care | Payer: MEDICAID | Source: Home / Self Care | Attending: Psychiatry | Admitting: Psychiatry

## 2013-05-23 ENCOUNTER — Emergency Department (HOSPITAL_COMMUNITY)
Admission: EM | Admit: 2013-05-23 | Discharge: 2013-05-24 | Disposition: A | Discharge status: Home / Self Care | Payer: Self-pay | Attending: Emergency Medicine | Admitting: Emergency Medicine

## 2013-05-23 ENCOUNTER — Encounter (HOSPITAL_COMMUNITY): Payer: Self-pay | Admitting: Emergency Medicine

## 2013-05-23 DIAGNOSIS — M545 Low back pain, unspecified: Secondary | ICD-10-CM | POA: Insufficient documentation

## 2013-05-23 DIAGNOSIS — IMO0002 Reserved for concepts with insufficient information to code with codable children: Secondary | ICD-10-CM | POA: Insufficient documentation

## 2013-05-23 DIAGNOSIS — F331 Major depressive disorder, recurrent, moderate: Secondary | ICD-10-CM

## 2013-05-23 DIAGNOSIS — R7989 Other specified abnormal findings of blood chemistry: Secondary | ICD-10-CM

## 2013-05-23 DIAGNOSIS — F17200 Nicotine dependence, unspecified, uncomplicated: Secondary | ICD-10-CM | POA: Insufficient documentation

## 2013-05-23 DIAGNOSIS — F3289 Other specified depressive episodes: Secondary | ICD-10-CM | POA: Insufficient documentation

## 2013-05-23 DIAGNOSIS — S59909A Unspecified injury of unspecified elbow, initial encounter: Secondary | ICD-10-CM | POA: Insufficient documentation

## 2013-05-23 DIAGNOSIS — F411 Generalized anxiety disorder: Secondary | ICD-10-CM | POA: Insufficient documentation

## 2013-05-23 DIAGNOSIS — F0630 Mood disorder due to known physiological condition, unspecified: Secondary | ICD-10-CM

## 2013-05-23 DIAGNOSIS — Y929 Unspecified place or not applicable: Secondary | ICD-10-CM | POA: Insufficient documentation

## 2013-05-23 DIAGNOSIS — M79669 Pain in unspecified lower leg: Secondary | ICD-10-CM

## 2013-05-23 DIAGNOSIS — G8929 Other chronic pain: Secondary | ICD-10-CM | POA: Insufficient documentation

## 2013-05-23 DIAGNOSIS — F329 Major depressive disorder, single episode, unspecified: Secondary | ICD-10-CM | POA: Insufficient documentation

## 2013-05-23 DIAGNOSIS — S6990XA Unspecified injury of unspecified wrist, hand and finger(s), initial encounter: Secondary | ICD-10-CM | POA: Insufficient documentation

## 2013-05-23 DIAGNOSIS — R45851 Suicidal ideations: Secondary | ICD-10-CM | POA: Insufficient documentation

## 2013-05-23 DIAGNOSIS — E872 Acidosis, unspecified: Secondary | ICD-10-CM

## 2013-05-23 DIAGNOSIS — Z79899 Other long term (current) drug therapy: Secondary | ICD-10-CM | POA: Insufficient documentation

## 2013-05-23 DIAGNOSIS — R778 Other specified abnormalities of plasma proteins: Secondary | ICD-10-CM

## 2013-05-23 DIAGNOSIS — F4310 Post-traumatic stress disorder, unspecified: Secondary | ICD-10-CM

## 2013-05-23 DIAGNOSIS — F17210 Nicotine dependence, cigarettes, uncomplicated: Secondary | ICD-10-CM

## 2013-05-23 DIAGNOSIS — Y939 Activity, unspecified: Secondary | ICD-10-CM | POA: Insufficient documentation

## 2013-05-23 DIAGNOSIS — F323 Major depressive disorder, single episode, severe with psychotic features: Secondary | ICD-10-CM

## 2013-05-23 DIAGNOSIS — F1020 Alcohol dependence, uncomplicated: Secondary | ICD-10-CM

## 2013-05-23 DIAGNOSIS — J45901 Unspecified asthma with (acute) exacerbation: Secondary | ICD-10-CM

## 2013-05-23 DIAGNOSIS — Z5900 Homelessness unspecified: Secondary | ICD-10-CM

## 2013-05-23 DIAGNOSIS — J45909 Unspecified asthma, uncomplicated: Secondary | ICD-10-CM | POA: Insufficient documentation

## 2013-05-23 DIAGNOSIS — B20 Human immunodeficiency virus [HIV] disease: Secondary | ICD-10-CM

## 2013-05-23 DIAGNOSIS — W19XXXA Unspecified fall, initial encounter: Secondary | ICD-10-CM | POA: Insufficient documentation

## 2013-05-23 DIAGNOSIS — F1010 Alcohol abuse, uncomplicated: Secondary | ICD-10-CM

## 2013-05-23 DIAGNOSIS — Z21 Asymptomatic human immunodeficiency virus [HIV] infection status: Secondary | ICD-10-CM | POA: Insufficient documentation

## 2013-05-23 DIAGNOSIS — S59919A Unspecified injury of unspecified forearm, initial encounter: Secondary | ICD-10-CM

## 2013-05-23 DIAGNOSIS — F1994 Other psychoactive substance use, unspecified with psychoactive substance-induced mood disorder: Secondary | ICD-10-CM

## 2013-05-23 LAB — CBC
HEMATOCRIT: 38.3 % — AB (ref 39.0–52.0)
HEMOGLOBIN: 13.1 g/dL (ref 13.0–17.0)
MCH: 31.3 pg (ref 26.0–34.0)
MCHC: 34.2 g/dL (ref 30.0–36.0)
MCV: 91.4 fL (ref 78.0–100.0)
Platelets: 290 10*3/uL (ref 150–400)
RBC: 4.19 MIL/uL — ABNORMAL LOW (ref 4.22–5.81)
RDW: 12.8 % (ref 11.5–15.5)
WBC: 5.4 10*3/uL (ref 4.0–10.5)

## 2013-05-23 LAB — COMPREHENSIVE METABOLIC PANEL
ALT: 48 U/L (ref 0–53)
AST: 26 U/L (ref 0–37)
Albumin: 4 g/dL (ref 3.5–5.2)
Alkaline Phosphatase: 79 U/L (ref 39–117)
BUN: 16 mg/dL (ref 6–23)
CALCIUM: 9.6 mg/dL (ref 8.4–10.5)
CO2: 24 meq/L (ref 19–32)
CREATININE: 0.92 mg/dL (ref 0.50–1.35)
Chloride: 102 mEq/L (ref 96–112)
GLUCOSE: 85 mg/dL (ref 70–99)
Potassium: 4.3 mEq/L (ref 3.7–5.3)
Sodium: 141 mEq/L (ref 137–147)
Total Bilirubin: 0.5 mg/dL (ref 0.3–1.2)
Total Protein: 7.6 g/dL (ref 6.0–8.3)

## 2013-05-23 LAB — RAPID URINE DRUG SCREEN, HOSP PERFORMED
Amphetamines: NOT DETECTED
BARBITURATES: NOT DETECTED
Benzodiazepines: POSITIVE — AB
Cocaine: NOT DETECTED
OPIATES: NOT DETECTED
Tetrahydrocannabinol: POSITIVE — AB

## 2013-05-23 LAB — ETHANOL: Alcohol, Ethyl (B): <11 mg/dL (ref 0–11)

## 2013-05-23 MED ORDER — ONDANSETRON HCL 4 MG PO TABS: 4.0000 mg | ORAL_TABLET | Freq: Three times a day (TID) | ORAL | Status: DC | PRN

## 2013-05-23 MED ORDER — QUETIAPINE FUMARATE 100 MG PO TABS
100.0000 mg | ORAL_TABLET | Freq: Two times a day (BID) | ORAL | Status: DC
  Administered 2013-05-24: 100 mg via ORAL
  Filled 2013-05-23: qty 1

## 2013-05-23 MED ORDER — QUETIAPINE FUMARATE 100 MG PO TABS
200.0000 mg | ORAL_TABLET | Freq: Every day | ORAL | Status: DC
  Administered 2013-05-23: 200 mg via ORAL
  Filled 2013-05-23: qty 2

## 2013-05-23 MED ORDER — ACETAMINOPHEN 325 MG PO TABS: 650.0000 mg | ORAL_TABLET | ORAL | Status: DC | PRN

## 2013-05-23 MED ORDER — CITALOPRAM HYDROBROMIDE 10 MG PO TABS
10.0000 mg | ORAL_TABLET | Freq: Every day | ORAL | Status: DC
  Administered 2013-05-24: 10 mg via ORAL
  Filled 2013-05-23: qty 1

## 2013-05-23 MED ORDER — ONDANSETRON 4 MG PO TBDP: 4.0000 mg | ORAL_TABLET | Freq: Four times a day (QID) | ORAL | Status: DC | PRN

## 2013-05-23 MED ORDER — FLUTICASONE PROPIONATE HFA 44 MCG/ACT IN AERO
1.0000 | INHALATION_SPRAY | Freq: Two times a day (BID) | RESPIRATORY_TRACT | Status: DC
  Administered 2013-05-23 – 2013-05-24 (×2): 1 via RESPIRATORY_TRACT
  Filled 2013-05-23: qty 10.6

## 2013-05-23 MED ORDER — ALBUTEROL SULFATE HFA 108 (90 BASE) MCG/ACT IN AERS
2.0000 | INHALATION_SPRAY | RESPIRATORY_TRACT | Status: DC | PRN
  Filled 2013-05-23: qty 6.7

## 2013-05-23 MED ORDER — HYDROXYZINE HCL 25 MG PO TABS: 25.0000 mg | ORAL_TABLET | Freq: Four times a day (QID) | ORAL | Status: DC | PRN

## 2013-05-23 MED ORDER — TRAZODONE HCL 100 MG PO TABS
100.0000 mg | ORAL_TABLET | Freq: Every evening | ORAL | Status: DC | PRN
  Administered 2013-05-23: 100 mg via ORAL
  Filled 2013-05-23: qty 1

## 2013-05-23 MED ORDER — NICOTINE 21 MG/24HR TD PT24
21.0000 mg | MEDICATED_PATCH | Freq: Every day | TRANSDERMAL | Status: DC
  Filled 2013-05-23: qty 1

## 2013-05-23 MED ORDER — CHLORDIAZEPOXIDE HCL 25 MG PO CAPS: 25.0000 mg | ORAL_CAPSULE | Freq: Four times a day (QID) | ORAL | Status: DC | PRN

## 2013-05-23 MED ORDER — LOPERAMIDE HCL 2 MG PO CAPS: 2.0000 mg | ORAL_CAPSULE | ORAL | Status: DC | PRN

## 2013-05-23 NOTE — ED Notes (Signed)
At present patient denies SI, HI, AVH. Contracts for safety on the unit. Rates feelings of anxiety 6/10, depression 8/10.   Encouragement offered.   Patient safety maintained. Q 15 checks continue.

## 2013-05-23 NOTE — Discharge Instructions (Signed)
Alcohol and Nutrition °Nutrition serves two purposes. It provides energy. It also maintains body structure and function. Food supplies energy. It also provides the building blocks needed to replace worn or damaged cells. Alcoholics often eat poorly. This limits their supply of essential nutrients. This affects energy supply and structure maintenance. Alcohol also affects the body's nutrients in: °· Digestion. °· Storage. °· Using and getting rid of waste products. °IMPAIRMENT OF NUTRIENT DIGESTION AND UTILIZATION  °· Once ingested, food must be broken down into small components (digested). Then it is available for energy. It helps maintain body structure and function. Digestion begins in the mouth. It continues in the stomach and intestines, with help from the pancreas. The nutrients from digested food are absorbed from the intestines into the blood. Then they are carried to the liver. The liver prepares nutrients for: °· Immediate use. °· Storage and future use. °· Alcohol inhibits the breakdown of nutrients into usable molecules. °· It decreases secretion of digestive enzymes from the pancreas. °· Alcohol impairs nutrient absorption by damaging the cells lining the stomach and intestines. °· It also interferes with moving some nutrients into the blood. °· In addition, nutritional deficiencies themselves may lead to further absorption problems. °· For example, folate deficiency changes the cells that line the small intestine. This impairs how water is absorbed. It also affects absorbed nutrients. These include glucose, sodium, and additional folate. °· Even if nutrients are digested and absorbed, alcohol can prevent them from being fully used. It changes their transport, storage, and excretion. Impaired utilization of nutrients by alcoholics is indicated by: °· Decreased liver stores of vitamins, such as vitamin A. °· Increased excretion of nutrients such as fat. °ALCOHOL AND ENERGY SUPPLY  °· Three basic  nutritional components found in food are: °· Carbohydrates. °· Proteins. °· Fats. °· These are used as energy. Some alcoholics take in as much as 50% of their total daily calories from alcohol. They often neglect important foods. °· Even when enough food is eaten, alcohol can impair the ways the body controls blood sugar (glucose) levels. It may either increase or decrease blood sugar. °· In non-diabetic alcoholics, increased blood sugar (hyperglycemia) is caused by poor insulin secretion. It is usually temporary. °· Decreased blood sugar (hypoglycemia) can cause serious injury even if this condition is short-lived. Low blood sugar can happen when a fasting or malnourished person drinks alcohol. When there is no food to supply energy, stored sugar is used up. The products of alcohol inhibit forming glucose from other compounds such as amino acids. As a result, alcohol causes the brain and other body tissue to lack glucose. It is needed for energy and function. °· Alcohol is an energy source. But how the body processes and uses the energy from alcohol is complex. Also, when alcohol is substituted for carbohydrates, subjects tend to lose weight. This indicates that they get less energy from alcohol than from food. °ALCOHOL - MAINTAINING CELL STRUCTURE AND FUNCTION  °Structure °Cells are made mostly of protein. So an adequate protein diet is important for maintaining cell structure. This is especially true if cells are being damaged. Research indicates that alcohol affects protein nutrition by causing impaired: °· Digestion of proteins to amino acids. °· Processing of amino acids by the small intestine and liver. °· Synthesis of proteins from amino acids. °· Protein secretion by the liver. °Function °Nutrients are essential for the body to function well. They provide the tools that the body needs to work well:  °·   Proteins.  Vitamins.  Minerals. Alcohol can disrupt body function. It may cause nutrient  deficiencies. And it may interfere with the way nutrients are processed. Vitamins  Vitamins are essential to maintain growth and normal metabolism. They regulate many of the body`s processes. Chronic heavy drinking causes deficiencies in many vitamins. This is caused by eating less. And, in some cases, vitamins may be poorly absorbed. For example, alcohol inhibits fat absorption. It impairs how the vitamins A, E, and D are normally absorbed along with dietary fats. Not enough vitamin A may cause night blindness. Not enough vitamin D may cause softening of the bones.  Some alcoholics lack vitamins A, C, D, E, K, and the B vitamins. These are all involved in wound healing and cell maintenance. In particular, because vitamin K is necessary for blood clotting, lacking that vitamin can cause delayed clotting. The result is excess bleeding. Lacking other vitamins involved in brain function may cause severe neurological damage. Minerals Deficiencies of minerals such as calcium, magnesium, iron, and zinc are common in alcoholics. The alcohol itself does not seem to affect how these minerals are absorbed. Rather, they seem to occur secondary to other alcohol-related problems, such as:  Less calcium absorbed.  Not enough magnesium.  More urinary excretion.  Vomiting.  Diarrhea.  Not enough iron due to gastrointestinal bleeding.  Not enough zinc or losses related to other nutrient deficiencies.  Mineral deficiencies can cause a variety of medical consequences. These range from calcium-related bone disease to zinc-related night blindness and skin lesions. ALCOHOL, MALNUTRITION, AND MEDICAL COMPLICATIONS  Liver Disease   Alcoholic liver damage is caused primarily by alcohol itself. But poor nutrition may increase the risk of alcohol-related liver damage. For example, nutrients normally found in the liver are known to be affected by drinking alcohol. These include carotenoids, which are the major  sources of vitamin A, and vitamin E compounds. Decreases in such nutrients may play some role in alcohol-related liver damage. Pancreatitis  Research suggests that malnutrition may increase the risk of developing alcoholic pancreatitis. Research suggests that a diet lacking in protein may increase alcohol's damaging effect on the pancreas. Brain  Nutritional deficiencies may have severe effects on brain function. These may be permanent. Specifically, thiamine deficiencies are often seen in alcoholics. They can cause severe neurological problems. These include:  Impaired movement.  Memory loss seen in Wernicke-Korsakoff syndrome. Pregnancy  Alcohol has toxic effects on fetal development. It causes alcohol-related birth defects. They include fetal alcohol syndrome. Alcohol itself is toxic to the fetus. Also, the nutritional deficiency can affect how the fetus develops. That may compound the risk of developmental damage.  Nutritional needs during pregnancy are 10% to 30% greater than normal. Food intake can increase by as much as 140% to cover the needs of both mother and fetus. An alcoholic mother`s nutritional problems may adversely affect the nutrition of the fetus. And alcohol itself can also restrict nutrition flow to the fetus. NUTRITIONAL STATUS OF ALCOHOLICS  Techniques for assessing nutritional status include:  Taking body measurements to estimate fat reserves. They include:  Weight.  Height.  Mass.  Skin fold thickness.  Performing blood analysis to provide measurements of circulating:  Proteins.  Vitamins.  Minerals.  These techniques tend to be imprecise. For many nutrients, there is no clear "cut-off" point that would allow an accurate definition of deficiency. So assessing the nutritional status of alcoholics is limited by these techniques. Dietary status may provide information about the risk of developing nutritional problems.  Dietary status is assessed by:  Taking  patients' dietary histories.  Evaluating the amount and types of food they are eating.  It is difficult to determine what exact amount of alcohol begins to have damaging effects on nutrition. In general, moderate drinkers have 2 drinks or less per day. They seem to be at little risk for nutritional problems. Various medical disorders begin to appear at greater levels.  Research indicates that the majority of even the heaviest drinkers have few obvious nutritional deficiencies. Many alcoholics who are hospitalized for medical complications of their disease do have severe malnutrition. Alcoholics tend to eat poorly. Often they eat less than the amounts of food necessary to provide enough:  Carbohydrates.  Protein.  Fat.  Vitamins A and C.  B vitamins.  Minerals like calcium and iron. Of major concern is alcohol's effect on digesting food and use of nutrients. It may shift a mildly malnourished person toward severe malnutrition. Document Released: 12/24/2004 Document Revised: 05/24/2011 Document Reviewed: 06/09/2005 [facility] Patient Information 2014 [place].  Alcohol Use Disorder Alcohol use disorder is a mental disorder. It is not a one-time incident of heavy drinking. Alcohol use disorder is the excessive and uncontrollable use of alcohol over time that leads to problems with functioning in one or more areas of daily living. People with this disorder risk harming themselves and others when they drink to excess. Alcohol use disorder also can cause other mental disorders, such as mood and anxiety disorders, and serious physical problems. People with alcohol use disorder often misuse other drugs.  Alcohol use disorder is common and widespread. Some people with this disorder drink alcohol to cope with or escape from negative life events. Others drink to relieve chronic pain or symptoms of mental illness. People with a family history of alcohol use disorder are at higher risk of losing  control and using alcohol to excess.  SYMPTOMS  Signs and symptoms of alcohol use disorder may include the following:   Consumption ofalcohol inlarger amounts or over a longer period of time than intended.  Multiple unsuccessful attempts to cutdown or control alcohol use.   A great deal of time spent obtaining alcohol, using alcohol, or recovering from the effects of alcohol (hangover).  A strong desire or urge to use alcohol (cravings).   Continued use of alcohol despite problems at work, school, or home because of alcohol use.   Continued use of alcohol despite problems in relationships because of alcohol use.  Continued use of alcohol in situations when it is physically hazardous, such as driving a car.  Continued use of alcohol despite awareness of a physical or psychological problem that is likely related to alcohol use. Physical problems related to alcohol use can involve the brain, heart, liver, stomach, and intestines. Psychological problems related to alcohol use include intoxication, depression, anxiety, psychosis, delirium, and dementia.   The need for increased amounts of alcohol to achieve the same desired effect, or a decreased effect from the consumption of the same amount of alcohol (tolerance).  Withdrawal symptoms upon reducing or stopping alcohol use, or alcohol use to reduce or avoid withdrawal symptoms. Withdrawal symptoms include:  Racing heart.  Hand tremor.  Difficulty sleeping.  Nausea.  Vomiting.  Hallucinations.  Restlessness.  Seizures. DIAGNOSIS Alcohol use disorder is diagnosed through an assessment by your caregiver. Your caregiver may start by asking three or four questions to screen for excessive or problematic alcohol use. To confirm a diagnosis of alcohol use disorder, at least two symptoms (  see SYMPTOMS) must be present within a 32-month period. The severity of alcohol use disorder depends on the number of symptoms:  Mild two or  three.  Moderate four or five.  Severe six or more. Your caregiver may perform a physical exam or use results from lab tests to see if you have physical problems resulting from alcohol use. Your caregiver may refer you to a mental health professional for evaluation. TREATMENT  Some people with alcohol use disorder are able to reduce their alcohol use to low-risk levels. Some people with alcohol use disorder need to quit drinking alcohol. When necessary, mental health professionals with specialized training in substance use treatment can help. Your caregiver can help you decide how severe your alcohol use disorder is and what type of treatment you need. The following forms of treatment are available:   Detoxification. Detoxification involves the use of prescription medication to prevent alcohol withdrawal symptoms in the first week after quitting. This is important for people with a history of symptoms of withdrawal and for heavy drinkers who are likely to have withdrawal symptoms. Alcohol withdrawal can be dangerous and, in severe cases, cause death. Detoxification is usually provided in a hospital or in-patient substance use treatment facility.  Counseling or talk therapy. Talk therapy is provided by substance use treatment counselors. It addresses the reasons people use alcohol and ways to keep them from drinking again. The goals of talk therapy are to help people with alcohol use disorder find healthy activities and ways to cope with life stress, to identify and avoid triggers for alcohol use, and to handle cravings, which can cause relapse.  Medication.Different medications can help treat alcohol use disorder through the following actions:  Decrease alcohol cravings.  Decrease the positive reward response felt from alcohol use.  Produce an uncomfortable physical reaction when alcohol is used (aversion therapy).  Support groups. Support groups are run by people who have quit drinking. They  provide emotional support, advice, and guidance. These forms of treatment are often combined. Some people with alcohol use disorder benefit from intensive combination treatment provided by specialized substance use treatment centers. Both inpatient and outpatient treatment programs are available. Document Released: 04/08/2004 Document Revised: 11/01/2012 Document Reviewed: 06/08/2012 [facility] Patient Information 2014 [place].  Alcohol Problems Most adults who drink alcohol drink in moderation (not a lot) are at low risk for developing problems related to their drinking. However, all drinkers, including low-risk drinkers, should know about the health risks connected with drinking alcohol. RECOMMENDATIONS FOR LOW-RISK DRINKING  Drink in moderation. Moderate drinking is defined as follows:   Men - no more than 2 drinks per day.  Nonpregnant women - no more than 1 drink per day.  Over age 58 - no more than 1 drink per day. A standard drink is 12 grams of pure alcohol, which is equal to a 12 ounce bottle of beer or wine cooler, a 5 ounce glass of wine, or 1.5 ounces of distilled spirits (such as whiskey, brandy, vodka, or rum).  ABSTAIN FROM (DO NOT DRINK) ALCOHOL:  When pregnant or considering pregnancy.  When taking a medication that interacts with alcohol.  If you are alcohol dependent.  A medical condition that prohibits drinking alcohol (such as ulcer, liver disease, or heart disease). DISCUSS WITH YOUR CAREGIVER:  If you are at risk for coronary heart disease, discuss the potential benefits and risks of alcohol use: Light to moderate drinking is associated with lower rates of coronary heart disease in certain populations (  for example, men over age 55 and postmenopausal women). Infrequent or nondrinkers are advised not to begin light to moderate drinking to reduce the risk of coronary heart disease so as to avoid creating an alcohol-related problem. Similar protective effects can  likely be gained through proper diet and exercise.  Women and the elderly have smaller amounts of body water than men. As a result women and the elderly achieve a higher blood alcohol concentration after drinking the same amount of alcohol.  Exposing a fetus to alcohol can cause a broad range of birth defects referred to as Fetal Alcohol Syndrome (FAS) or Alcohol-Related Birth Defects (ARBD). Although FAS/ARBD is connected with excessive alcohol consumption during pregnancy, studies also have reported neurobehavioral problems in infants born to mothers reporting drinking an average of 1 drink per day during pregnancy.  Heavier drinking (the consumption of more than 4 drinks per occasion by men and more than 3 drinks per occasion by women) impairs learning (cognitive) and psychomotor functions and increases the risk of alcohol-related problems, including accidents and injuries. CAGE QUESTIONS:   Have you ever felt that you should Cut down on your drinking?  Have people Annoyed you by criticizing your drinking?  Have you ever felt bad or Guilty about your drinking?  Have you ever had a drink first thing in the morning to steady your nerves or get rid of a hangover (Eye opener)? If you answered positively to any of these questions: You may be at risk for alcohol-related problems if alcohol consumption is:   Men: Greater than 14 drinks per week or more than 4 drinks per occasion.  Women: Greater than 7 drinks per week or more than 3 drinks per occasion. Do you or your family have a medical history of alcohol-related problems, such as:  Blackouts.  Sexual dysfunction.  Depression.  Trauma.  Liver dysfunction.  Sleep disorders.  Hypertension.  Chronic abdominal pain.  Has your drinking ever caused you problems, such as problems with your family, problems with your work (or school) performance, or accidents/injuries?  Do you have a compulsion to drink or a preoccupation with  drinking?  Do you have poor control or are you unable to stop drinking once you have started?  Do you have to drink to avoid withdrawal symptoms?  Do you have problems with withdrawal such as tremors, nausea, sweats, or mood disturbances?  Does it take more alcohol than in the past to get you high?  Do you feel a strong urge to drink?  Do you change your plans so that you can have a drink?  Do you ever drink in the morning to relieve the shakes or a hangover? If you have answered a number of the previous questions positively, it may be time for you to talk to your caregivers, family, and friends and see if they think you have a problem. Alcoholism is a chemical dependency that keeps getting worse and will eventually destroy your health and relationships. Many alcoholics end up dead, impoverished, or in prison. This is often the end result of all chemical dependency.  Do not be discouraged if you are not ready to take action immediately.  Decisions to change behavior often involve up and down desires to change and feeling like you cannot decide.  Try to think more seriously about your drinking behavior.  Think of the reasons to quit. WHERE TO GO FOR ADDITIONAL INFORMATION   The [place] on Alcohol Abuse and Alcoholism ([place]) http://www.bradshaw.com/  [employer] on  Alcoholism and Drug Dependence (NCADD) www.ncadd.[place] (ASAM) http://carpenter.net/  Document Released: 03/01/2005 Document Revised: 05/24/2011 Document Reviewed: 10/18/2007 [facility] Patient Information 2014 [place].

## 2013-05-23 NOTE — ED Provider Notes (Signed)
CSN: [id number]     Arrival date & time 05/23/13  1131 History   This chart was scribed for non-physician practitioner working with Tanna Furry, MD by Mercy Moore, ED Scribe. This patient was seen in room WTR5/WTR5 and the patient's care was started at 12:35 PM.  Chief Complaint  Patient presents with  . Drug / Alcohol Assessment      The history is provided by the patient. No language interpreter was used.   HPI Comments: John [name] is a 43 y.o. male who presents to the Emergency Department requesting alcohol detoxification treatment. Patient reports that he has been drinking since the age of 62 and has developed a high tolerance. Patient last reports consuming a fifth of alcohol last night between 8pm-9:30pm. The patient reports that he was drinking socially, but is confident that he consumed most of the bottle. Patient reports psychological disorders: bipolar disorder, schizophrenia, and depression. The patient shares that he takes medication to treat his bipolar disorder and schizophrenia, but has never been evaluated for his depression. The patient endorses that he takes his medication as directed, but admits to forgetting sometimes due to his alcoholism. Patient reports that he is not currently experiencing symptoms of his bipolar disorder or schizophrenia, but is having suicidal ideation. Patient reports presence of suicidal ideation for 3.5 years, a state that has remained unchanged (patient denies worsening of suicidal thoughts). Patient denies having a plan. Patient also reports desires to harm others, and a distant history of self-injurious behavior. Patient reports cutting his arms at the age of 65 and 71.  Patient denies nausea, vomiting, fever, SOB, chest pain, or abdominal pain. Patient shares that he has HIV, but is not currently taking medication.   Patient also mentions a wrist injury due to falling. Patient reports pain when applying pressure and weight to his left wrist  especially.   Past Medical History  Diagnosis Date  . Asthma   . HIV (human immunodeficiency virus infection)     "dx'd ~ 2 yr ago" (09/29/2012)  . Mental disorder   . Anxiety   . Depression   . Chronic low back pain    Past Surgical History  Procedure Laterality Date  . Skin graft full thickness leg Left ?    POSTERIOR LEFT LEG  AFTER DOG BITES   History reviewed. No pertinent family history. History  Substance Use Topics  . Smoking status: Current Every Day Smoker -- 0.50 packs/day for 27 years    Types: Cigarettes  . Smokeless tobacco: Never Used  . Alcohol Use: 15.6 oz/week    26 Shots of liquor per week     Comment: 09/29/2012 "drink 1 1/2 fifths/ of vodka/wk"    Review of Systems  Constitutional: Negative for fever and chills.  HENT: Negative.   Respiratory: Negative.   Cardiovascular: Negative.   Gastrointestinal: Negative.   Musculoskeletal: Negative.   Skin: Negative.   Neurological: Negative.       Allergies  Shellfish allergy  Home Medications   Current Outpatient Rx  Name  Route  Sig  Dispense  Refill  . albuterol (PROVENTIL HFA;VENTOLIN HFA) 108 (90 BASE) MCG/ACT inhaler   Inhalation   Inhale 2 puffs into the lungs every 4 (four) hours as needed for wheezing or shortness of breath.   1 Inhaler   2   . beclomethasone (QVAR) 80 MCG/ACT inhaler   Inhalation   Inhale 1 puff into the lungs 2 (two) times daily. [facility] sub is flovent 1  puff twice a day   1 Inhaler   12   . citalopram (CELEXA) 10 MG tablet   Oral   Take 1 tablet (10 mg total) by mouth daily. For depression   30 tablet   0   . QUEtiapine (SEROQUEL) 100 MG tablet   Oral   Take 100-150 mg by mouth 3 (three) times daily. Take 1 tablet (100 mg) twice daily & 1.5 tablet (150 mg) at bedtime for mood control.         . traZODone (DESYREL) 100 MG tablet   Oral   Take 1 tablet (100 mg total) by mouth at bedtime and may repeat dose one time if needed. For sleep   60  tablet   0    BP 117/75  Pulse 69  Temp(Src) 98 F (36.7 C) (Oral)  Resp 16  Wt 185 lb (83.915 kg)  SpO2 99% Physical Exam  Nursing note and vitals reviewed. Constitutional: He is oriented to person, place, and time. He appears well-developed and well-nourished. No distress.  HENT:  Head: Normocephalic and atraumatic.  Eyes: EOM are normal.  Neck: Neck supple. No tracheal deviation present.  Cardiovascular: Normal rate.   Pulmonary/Chest: Effort normal. No respiratory distress.  Abdominal: Soft. He exhibits no distension. There is no tenderness.  Musculoskeletal: Normal range of motion.  Neurological: He is alert and oriented to person, place, and time.  Skin: Skin is warm and dry.    ED Course  Procedures (including critical care time) DIAGNOSTIC STUDIES: Oxygen Saturation is 99% on room air, normal by my interpretation.    COORDINATION OF CARE: 12:43 PM- Pt advised of plan for treatment and pt agrees.    Labs Review Labs Reviewed  URINE RAPID DRUG SCREEN (HOSP PERFORMED) - Abnormal; Notable for the following:    Benzodiazepines POSITIVE (*)    Tetrahydrocannabinol POSITIVE (*)    All other components within normal limits  CBC  COMPREHENSIVE METABOLIC PANEL  ETHANOL   Results for orders placed during the hospital encounter of 05/23/13  CBC      Result Value Ref Range   WBC 5.4  4.0 - 10.5 K/uL   RBC 4.19 (*) 4.22 - 5.81 MIL/uL   Hemoglobin 13.1  13.0 - 17.0 g/dL   HCT 38.3 (*) 39.0 - 52.0 %   MCV 91.4  78.0 - 100.0 fL   MCH 31.3  26.0 - 34.0 pg   MCHC 34.2  30.0 - 36.0 g/dL   RDW 12.8  11.5 - 15.5 %   Platelets 290  150 - 400 K/uL  COMPREHENSIVE METABOLIC PANEL      Result Value Ref Range   Sodium 141  137 - 147 mEq/L   Potassium 4.3  3.7 - 5.3 mEq/L   Chloride 102  96 - 112 mEq/L   CO2 24  19 - 32 mEq/L   Glucose, Bld 85  70 - 99 mg/dL   BUN 16  6 - 23 mg/dL   Creatinine, Ser 0.92  0.50 - 1.35 mg/dL   Calcium 9.6  8.4 - 10.5 mg/dL   Total Protein  7.6  6.0 - 8.3 g/dL   Albumin 4.0  3.5 - 5.2 g/dL   AST 26  0 - 37 U/L   ALT 48  0 - 53 U/L   Alkaline Phosphatase 79  39 - 117 U/L   Total Bilirubin 0.5  0.3 - 1.2 mg/dL   GFR calc non Af Amer >90  >90 mL/min   GFR  calc Af Amer >90  >90 mL/min  ETHANOL      Result Value Ref Range   Alcohol, Ethyl (B) <11  0 - 11 mg/dL  URINE RAPID DRUG SCREEN (HOSP PERFORMED)      Result Value Ref Range   Opiates NONE DETECTED  NONE DETECTED   Cocaine NONE DETECTED  NONE DETECTED   Benzodiazepines POSITIVE (*) NONE DETECTED   Amphetamines NONE DETECTED  NONE DETECTED   Tetrahydrocannabinol POSITIVE (*) NONE DETECTED   Barbiturates NONE DETECTED  NONE DETECTED    Imaging Review No results found.   EKG Interpretation None      MDM   Final diagnoses:  None  1. alcholism 2. depression  He is stable. Will have [facility] evaluate for disposition recommendations.  I personally performed the services described in this documentation, which was scribed in my presence. The recorded information has been reviewed and is accurate.     Dewaine Oats, PA-C 05/26/13 [phone]

## 2013-05-23 NOTE — BHH Counselor (Signed)
Writer called and left voicemail for Kathi Der at [facility] - re: pt being psychiatrically cleared and medically cleared.   Arnold Long, [place] Assessment Counselor

## 2013-05-23 NOTE — Telephone Encounter (Signed)
Unable to contact patient to come in and fill out new application for his inhaler

## 2013-05-23 NOTE — Progress Notes (Addendum)
Patient was assessed at [facility] as a walk in.  Patient requests detox from alcohol.  Patient reports SI and HI without a plan.  Patient denies psychosis.  Patient denies compliance with medication management.  Patient is HIV positive.   Patient reports that he does not have to take meds for his HIV status presently.   Writer consulted with the NP, Aggie regarding the patient meeting criteria for inpatient hospitalization.  At present there are no beds at [facility].   Writer informed the Camera operator Jiles Garter) and the TTS Counselor of the patients disposition and arrival to [facility].  Writer coordinated transportation through [employer]   Patient is working with Jeananne Rama with the [facility]).  Patient signed a consent to release information form.  Nunzio Cory wants to be informed when the patient is discharged.

## 2013-05-23 NOTE — Consult Note (Signed)
Face to face evaluation and I agree with this note 

## 2013-05-23 NOTE — Progress Notes (Signed)
[facility] provided pt with a list of primary care resources, highlighting [facility]. Spoke with patient about [employer] program to help establish primary care.

## 2013-05-23 NOTE — Consult Note (Signed)
   At the time of discharge Mr Handshoe now says he is suicidal and is requesting a bed at [facility].  Says the suicidal thoughts come and go and they just came again.  He insists he is really suicidal even though he will likely lose his potential bed at [facility].  Will look for an inpatient bed and if still here in the a.m. Will reassess then.

## 2013-05-23 NOTE — Consult Note (Signed)
[place] Psychiat[facility] Consult   Reason for Consult:  Medical clearance, polysubstance abuse, and alcohol detox Referring Physician:  EDP  John [name] is an 43 y.o. male. Total Time spent with patient: 45 minutes  Assessment: AXIS I:  Alcohol Abuse and Substance Abuse AXIS II:  Deferred AXIS III:   Past Medical History  Diagnosis Date  . Asthma   . HIV (human immunodeficiency virus infection)     "dx'd ~ 2 yr ago" (09/29/2012)  . Mental disorder   . Anxiety   . Depression   . Chronic low back pain    AXIS IV:  housing problems, other psychosocial or environmental problems and problems related to social environment AXIS V:  61-70 mild symptoms  Plan:  No evidence of imminent risk to self or others at present.   Patient does not meet criteria for psychiatric inpatient admission. Supportive therapy provided about ongoing stressors. Discussed crisis plan, support from social network, calling 911, coming to the Emergency Department, and calling Suicide Hotline.  Subjective:   John [name] is a 43 y.o. male patient.  HPI:  Patient states that he has been working with John [name] a Education officer, museum at [employer].  States that she has been helping him to get into [facility]  Patient states that they spoke to a John [name] at [facility] and he was instructed to come here [facility]) for medical clearance.  Patient states that he is seeking rehab treatment.  Patient states that he was recently discharged from [facility] last week on a Wednesday.  Patient denies suicidal/homicidal ideation, psychosis, and paranoia at this time.  Patient states that he does have a history of hearing voices but none at this present time.   HPI Elements:   Location:  Polysubstance abuse. Quality:  wanting rehab services. Severity:  wanting rehab services. Timing:  1 week.  Past Psychiatric History: Past Medical History  Diagnosis Date  . Asthma   . HIV (human immunodeficiency virus infection)     "dx'd ~ 2 yr ago" (09/29/2012)   . Mental disorder   . Anxiety   . Depression   . Chronic low back pain     reports that he has been smoking Cigarettes.  He has a 13.5 pack-year smoking history. He has never used smokeless tobacco. He reports that he drinks about 15.6 ounces of alcohol per week. He reports that he uses illicit drugs (Marijuana) about 7 times per week. History reviewed. No pertinent family history.         Allergies:   Allergies  Allergen Reactions  . Shellfish Allergy Anaphylaxis and Swelling    ACT Assessment Complete:  No:   Past Psychiatric History: Diagnosis:  Alcohol Abuse and Substance Abuse  Hospitalizations:  Prior detox services; [facility]   Outpatient Care:  Denies  Substance Abuse Care:  THC, Alcohol, THC, Benzo  Self-Mutilation:  Denies  Suicidal Attempts:  Denies  Homicidal Behaviors:  Denies   Violent Behaviors:  Denies   Place of Residence:  [place] Marital Status:  Single Employed/Unemployed:  Unemployed Education:   Family Supports:  No Objective: Blood pressure 117/75, pulse 69, temperature 98 F (36.7 C), temperature source Oral, resp. rate 16, weight 83.915 kg (185 lb), SpO2 99.00%.Body mass index is 27.72 kg/(m^2). Results for orders placed during the hospital encounter of 05/23/13 (from the past 72 hour(s))  URINE RAPID DRUG SCREEN (HOSP PERFORMED)     Status: Abnormal   Collection Time    05/23/13 11:47 AM  Result Value Ref Range   Opiates NONE DETECTED  NONE DETECTED   Cocaine NONE DETECTED  NONE DETECTED   Benzodiazepines POSITIVE (*) NONE DETECTED   Amphetamines NONE DETECTED  NONE DETECTED   Tetrahydrocannabinol POSITIVE (*) NONE DETECTED   Barbiturates NONE DETECTED  NONE DETECTED   Comment:            DRUG SCREEN FOR MEDICAL PURPOSES     ONLY.  IF CONFIRMATION IS NEEDED     FOR ANY PURPOSE, NOTIFY LAB     WITHIN 5 DAYS.                LOWEST DETECTABLE LIMITS     FOR URINE DRUG SCREEN     Drug Class       Cutoff (ng/mL)     Amphetamine       1000     Barbiturate      200     Benzodiazepine   785     Tricyclics       885     Opiates          300     Cocaine          300     THC              50  CBC     Status: Abnormal   Collection Time    05/23/13 12:24 PM      Result Value Ref Range   WBC 5.4  4.0 - 10.5 K/uL   RBC 4.19 (*) 4.22 - 5.81 MIL/uL   Hemoglobin 13.1  13.0 - 17.0 g/dL   HCT 38.3 (*) 39.0 - 52.0 %   MCV 91.4  78.0 - 100.0 fL   MCH 31.3  26.0 - 34.0 pg   MCHC 34.2  30.0 - 36.0 g/dL   RDW 12.8  11.5 - 15.5 %   Platelets 290  150 - 400 K/uL  COMPREHENSIVE METABOLIC PANEL     Status: None   Collection Time    05/23/13 12:24 PM      Result Value Ref Range   Sodium 141  137 - 147 mEq/L   Potassium 4.3  3.7 - 5.3 mEq/L   Chloride 102  96 - 112 mEq/L   CO2 24  19 - 32 mEq/L   Glucose, Bld 85  70 - 99 mg/dL   BUN 16  6 - 23 mg/dL   Creatinine, Ser 0.92  0.50 - 1.35 mg/dL   Calcium 9.6  8.4 - 10.5 mg/dL   Total Protein 7.6  6.0 - 8.3 g/dL   Albumin 4.0  3.5 - 5.2 g/dL   AST 26  0 - 37 U/L   ALT 48  0 - 53 U/L   Alkaline Phosphatase 79  39 - 117 U/L   Total Bilirubin 0.5  0.3 - 1.2 mg/dL   GFR calc non Af Amer >90  >90 mL/min   GFR calc Af Amer >90  >90 mL/min   Comment: (NOTE)     The eGFR has been calculated using the CKD EPI equation.     This calculation has not been validated in all clinical situations.     eGFR's persistently <90 mL/min signify possible Chronic Kidney     Disease.  ETHANOL     Status: None   Collection Time    05/23/13 12:24 PM      Result Value Ref Range   Alcohol, Ethyl (B) <11  0 -  11 mg/dL   Comment:            LOWEST DETECTABLE LIMIT FOR     SERUM ALCOHOL IS 11 mg/dL     FOR MEDICAL PURPOSES ONLY   Labs are reviewed and are pertinent for UDS positive for Benzo, and THC. ETOH < 11.  Medications reviewed and no changes made.  Current Facility-Administered Medications  Medication Dose Route Frequency Provider Last Rate Last Dose  . acetaminophen (TYLENOL) tablet 650 mg   650 mg Oral Q4H PRN Shari A Upstill, PA-C      . nicotine (NICODERM CQ - dosed in mg/24 hours) patch 21 mg  21 mg Transdermal Daily Shari A Upstill, PA-C      . ondansetron (ZOFRAN) tablet 4 mg  4 mg Oral Q8H PRN Dewaine Oats, PA-C       Current Outpatient Prescriptions  Medication Sig Dispense Refill  . albuterol (PROVENTIL HFA;VENTOLIN HFA) 108 (90 BASE) MCG/ACT inhaler Inhale 2 puffs into the lungs every 4 (four) hours as needed for wheezing or shortness of breath.  1 Inhaler  2  . beclomethasone (QVAR) 80 MCG/ACT inhaler Inhale 1 puff into the lungs 2 (two) times daily. For [facility] sub is flovent 1 puff twice a day  1 Inhaler  12  . citalopram (CELEXA) 10 MG tablet Take 1 tablet (10 mg total) by mouth daily. For depression  30 tablet  0  . QUEtiapine (SEROQUEL) 100 MG tablet Take 100-150 mg by mouth 3 (three) times daily. Take 1 tablet (100 mg) twice daily & 1.5 tablet (150 mg) at bedtime for mood control.      . traZODone (DESYREL) 100 MG tablet Take 1 tablet (100 mg total) by mouth at bedtime and may repeat dose one time if needed. For sleep  60 tablet  0    Psychiatric Specialty Exam:     Blood pressure 117/75, pulse 69, temperature 98 F (36.7 C), temperature source Oral, resp. rate 16, weight 83.915 kg (185 lb), SpO2 99.00%.Body mass index is 27.72 kg/(m^2).  General Appearance: Casual  Eye Contact::  Good  Speech:  Clear and Coherent and Normal Rate  Volume:  Normal  Mood:  "I want to get into rehab"  Affect:  Congruent  Thought Process:  NA and Goal Directed  Orientation:  Full (Time, Place, and Person)  Thought Content:  "I just need to get medically cleared for [facility]"  Suicidal Thoughts:  No  Homicidal Thoughts:  No  Memory:  Immediate;   Good Recent;   Good Remote;   Fair  Judgement:  Fair  Insight:  Fair  Psychomotor Activity:  Normal  Concentration:  Fair  Recall:  Good  Fund of Knowledge:Good  Language: Good  Akathisia:  No  Handed:  Right  AIMS  (if indicated):     Assets:  Communication Skills Desire for Improvement  Sleep:      Musculoskeletal: Strength & Muscle Tone: within normal limits Gait & Station: normal Patient leans: N/A  Treatment Plan Summary: Out patient services  Consulted with TTS.  Spoke with Merry Proud at [facility].  Patient can return to [facility] with a copy of his hospital papers and may be able for admission on Monday morning.  Disposition:  Discharge recommended.  Patient does not need detox and Follow up with [facility] Monday morning.   Earleen Newport, FNP-BC 05/23/2013 1:36 PM

## 2013-05-24 NOTE — ED Notes (Signed)
Pt sleeping soundly, will give flovent on waking.

## 2013-05-24 NOTE — ED Notes (Signed)
CIWA deferred-pt sleeping soundly

## 2013-05-24 NOTE — BHH Suicide Risk Assessment (Signed)
Suicide Risk Assessment  Discharge Assessment     Demographic Factors:  Male, Divorced or widowed, Low socioeconomic status, Living alone and Unemployed  Total Time spent with patient: 1 hour  Psychiatric Specialty Exam:     Blood pressure 130/94, pulse 97, temperature 98.1 F (36.7 C), temperature source Oral, resp. rate 19, weight 83.915 kg (185 lb), SpO2 97.00%.Body mass index is 27.72 kg/(m^2).  General Appearance: Casual  Eye Contact::  Good  Speech:  Clear and Coherent  Volume:  Normal  Mood:  Depressed  Affect:  Appropriate  Thought Process:  Logical  Orientation:  Full (Time, Place, and Person)  Thought Content:  Negative  Suicidal Thoughts:  No  Homicidal Thoughts:  No  Memory:  Immediate;   Good Recent;   Good Remote;   Good  Judgement:  Good  Insight:  Fair  Psychomotor Activity:  Normal  Concentration:  Good  Recall:  Good  Fund of Knowledge:Good  Language: Good  Akathisia:  Negative  Handed:  Right  AIMS (if indicated):     Assets:  Communication Skills Desire for Improvement Physical Health  Sleep:       Musculoskeletal: Strength & Muscle Tone: within normal limits Gait & Station: normal Patient leans: N/A   Mental Status Per Nursing Assessment::   On Admission:     Current Mental Status by Physician: NA  Loss Factors: NA  Historical Factors: Impulsivity  Risk Reduction Factors:   Positive coping skills or problem solving skills  Continued Clinical Symptoms:  Depression:   Comorbid alcohol abuse/dependence  Cognitive Features That Contribute To Risk:  Closed-mindedness    Suicide Risk:  Minimal: No identifiable suicidal ideation.  Patients presenting with no risk factors but with morbid ruminations; may be classified as minimal risk based on the severity of the depressive symptoms  Discharge Diagnoses:   AXIS I:  Alcohol Abuse AXIS II:  Deferred AXIS III:   Past Medical History  Diagnosis Date  . Asthma   . HIV (human  immunodeficiency virus infection)     "dx'd ~ 2 yr ago" (09/29/2012)  . Mental disorder   . Anxiety   . Depression   . Chronic low back pain    AXIS IV:  economic problems, housing problems and occupational problems AXIS V:  61-70 mild symptoms  Plan Of Care/Follow-up recommendations:  Activity:  resume usual activity Diet:  resume usual diet  Is patient on multiple antipsychotic therapies at discharge:  No   Has Patient had three or more failed trials of antipsychotic monotherapy by history:  No  Recommended Plan for Multiple Antipsychotic Therapies: NA    Tatsuya Okray D 05/24/2013, 2:39 PM

## 2013-05-24 NOTE — ED Notes (Signed)
Up to the bathroom 

## 2013-05-24 NOTE — ED Notes (Addendum)
Written dc instructions reviewed w/ pt, pt verbalized understanding.  Pt encouraged to follow up at [facility] on Monday as scheduled. Pt ambulatory to dc window w/ mHt, belongings returned after leaving the unit.  Bus pass given

## 2013-05-24 NOTE — Consult Note (Signed)
  Psychiatric Specialty Exam: Physical Exam  ROS  Blood pressure 130/94, pulse 97, temperature 98.1 F (36.7 C), temperature source Oral, resp. rate 19, weight 83.915 kg (185 lb), SpO2 97.00%.Body mass index is 27.72 kg/(m^2).  General Appearance: Well Groomed  Engineer, water::  Good  Speech:  Clear and Coherent  Volume:  Normal  Mood:  Euthymic  Affect:  Appropriate  Thought Process:  Coherent and Logical  Orientation:  Full (Time, Place, and Person)  Thought Content:  Negative  Suicidal Thoughts:  No  Homicidal Thoughts:  No  Memory:  Immediate;   Good Recent;   Good Remote;   Good  Judgement:  Good  Insight:  Fair  Psychomotor Activity:  Normal  Concentration:  Good  Recall:  Good  Akathisia:  Negative  Handed:  Right  AIMS (if indicated):     Assets:  Communication Skills Desire for Improvement Physical Health  Sleep:   adequate  John [name] says today he is no longer suicidal and wants to be discharged.  Will discharge him home today to be followed by outpatient.  He will follow up with rehab on his own.

## 2013-05-24 NOTE — ED Notes (Signed)
Shuvon NP into see 

## 2013-05-26 NOTE — ED Provider Notes (Signed)
Medical screening examination/treatment/procedure(s) were performed by non-physician practitioner and as supervising physician I was immediately available for consultation/collaboration.   EKG Interpretation None        Tanna Furry, MD 05/26/13 [phone]

## 2013-05-30 ENCOUNTER — Telehealth: Payer: Self-pay | Admitting: *Deleted

## 2013-05-30 ENCOUNTER — Encounter: Payer: Self-pay | Admitting: *Deleted

## 2013-05-30 NOTE — Telephone Encounter (Signed)
Pt needing MD appt for detectable VL.  Listed phone number not correct.  Will send the pt a letter to call for appt.

## 2013-06-01 ENCOUNTER — Encounter (HOSPITAL_COMMUNITY): Payer: Self-pay

## 2013-06-01 MED ORDER — CITALOPRAM HYDROBROMIDE 10 MG PO TABS
10.0000 mg | ORAL_TABLET | Freq: Every day | ORAL | Status: DC
  Filled 2013-06-01 (×39): qty 1

## 2013-06-01 MED ORDER — LOPERAMIDE HCL 2 MG PO CAPS: 2.0000 mg | ORAL_CAPSULE | ORAL | Status: AC | PRN

## 2013-06-01 MED ORDER — NICOTINE 21 MG/24HR TD PT24
21.0000 mg | MEDICATED_PATCH | Freq: Every day | TRANSDERMAL | Status: DC
  Filled 2013-06-01 (×39): qty 1

## 2013-06-01 MED ORDER — ONDANSETRON 4 MG PO TBDP: 4.0000 mg | ORAL_TABLET | Freq: Four times a day (QID) | ORAL | Status: AC | PRN

## 2013-06-01 MED ORDER — TRAZODONE HCL 100 MG PO TABS
100.0000 mg | ORAL_TABLET | Freq: Every evening | ORAL | Status: DC | PRN
  Filled 2013-06-01 (×78): qty 1

## 2013-06-01 MED ORDER — QUETIAPINE FUMARATE 100 MG PO TABS
100.0000 mg | ORAL_TABLET | Freq: Two times a day (BID) | ORAL | Status: DC
  Filled 2013-06-01 (×78): qty 1

## 2013-06-01 MED ORDER — ONDANSETRON HCL 4 MG PO TABS: 4.0000 mg | ORAL_TABLET | Freq: Three times a day (TID) | ORAL | Status: DC | PRN

## 2013-06-01 MED ORDER — ACETAMINOPHEN 325 MG PO TABS: 650.0000 mg | ORAL_TABLET | ORAL | Status: DC | PRN

## 2013-06-01 MED ORDER — CHLORDIAZEPOXIDE HCL 25 MG PO CAPS: 25.0000 mg | ORAL_CAPSULE | Freq: Four times a day (QID) | ORAL | Status: AC | PRN

## 2013-06-01 MED ORDER — ALBUTEROL SULFATE HFA 108 (90 BASE) MCG/ACT IN AERS: 2.0000 | INHALATION_SPRAY | RESPIRATORY_TRACT | Status: DC | PRN

## 2013-06-01 MED ORDER — QUETIAPINE FUMARATE 200 MG PO TABS
200.0000 mg | ORAL_TABLET | Freq: Every day | ORAL | Status: DC
  Filled 2013-06-01 (×39): qty 1

## 2013-06-01 MED ORDER — FLUTICASONE PROPIONATE HFA 44 MCG/ACT IN AERO
1.0000 | INHALATION_SPRAY | Freq: Two times a day (BID) | RESPIRATORY_TRACT | Status: DC
  Filled 2013-06-01: qty 10.6

## 2013-06-01 MED ORDER — HYDROXYZINE HCL 25 MG PO TABS: 25.0000 mg | ORAL_TABLET | Freq: Four times a day (QID) | ORAL | Status: AC | PRN

## 2013-06-05 ENCOUNTER — Encounter: Payer: Self-pay | Admitting: *Deleted

## 2013-06-14 ENCOUNTER — Encounter (HOSPITAL_BASED_OUTPATIENT_CLINIC_OR_DEPARTMENT_OTHER): Payer: Self-pay | Admitting: Emergency Medicine

## 2013-06-14 ENCOUNTER — Emergency Department (HOSPITAL_BASED_OUTPATIENT_CLINIC_OR_DEPARTMENT_OTHER): Payer: Self-pay

## 2013-06-14 ENCOUNTER — Emergency Department (HOSPITAL_BASED_OUTPATIENT_CLINIC_OR_DEPARTMENT_OTHER)
Admission: EM | Admit: 2013-06-14 | Discharge: 2013-06-14 | Disposition: A | Discharge status: Home / Self Care | Payer: Self-pay | Attending: Emergency Medicine | Admitting: Emergency Medicine

## 2013-06-14 DIAGNOSIS — F411 Generalized anxiety disorder: Secondary | ICD-10-CM | POA: Insufficient documentation

## 2013-06-14 DIAGNOSIS — F17200 Nicotine dependence, unspecified, uncomplicated: Secondary | ICD-10-CM | POA: Insufficient documentation

## 2013-06-14 DIAGNOSIS — Z21 Asymptomatic human immunodeficiency virus [HIV] infection status: Secondary | ICD-10-CM | POA: Insufficient documentation

## 2013-06-14 DIAGNOSIS — F3289 Other specified depressive episodes: Secondary | ICD-10-CM | POA: Insufficient documentation

## 2013-06-14 DIAGNOSIS — IMO0002 Reserved for concepts with insufficient information to code with codable children: Secondary | ICD-10-CM | POA: Insufficient documentation

## 2013-06-14 DIAGNOSIS — F329 Major depressive disorder, single episode, unspecified: Secondary | ICD-10-CM | POA: Insufficient documentation

## 2013-06-14 DIAGNOSIS — Z79899 Other long term (current) drug therapy: Secondary | ICD-10-CM | POA: Insufficient documentation

## 2013-06-14 DIAGNOSIS — G8929 Other chronic pain: Secondary | ICD-10-CM | POA: Insufficient documentation

## 2013-06-14 DIAGNOSIS — J45901 Unspecified asthma with (acute) exacerbation: Secondary | ICD-10-CM | POA: Insufficient documentation

## 2013-06-14 HISTORY — DX: Alcohol abuse, uncomplicated: F10.10

## 2013-06-14 MED ORDER — ALBUTEROL SULFATE HFA 108 (90 BASE) MCG/ACT IN AERS
2.0000 | INHALATION_SPRAY | RESPIRATORY_TRACT | Status: DC | PRN
  Administered 2013-06-14: 2 via RESPIRATORY_TRACT
  Filled 2013-06-14: qty 6.7

## 2013-06-14 MED ORDER — PREDNISONE 50 MG PO TABS
60.0000 mg | ORAL_TABLET | Freq: Once | ORAL | Status: AC
  Administered 2013-06-14: 60 mg via ORAL
  Filled 2013-06-14 (×2): qty 1

## 2013-06-14 MED ORDER — PREDNISONE 10 MG PO TABS: 40.0000 mg | ORAL_TABLET | Freq: Every day | ORAL | Status: DC

## 2013-06-14 MED ORDER — ALBUTEROL SULFATE (2.5 MG/3ML) 0.083% IN NEBU
5.0000 mg | INHALATION_SOLUTION | Freq: Once | RESPIRATORY_TRACT | Status: DC
Start: 2013-06-14 — End: 2013-06-14

## 2013-06-14 MED ORDER — IPRATROPIUM-ALBUTEROL 0.5-2.5 (3) MG/3ML IN SOLN
3.0000 mL | RESPIRATORY_TRACT | Status: DC
  Administered 2013-06-14: 3 mL via RESPIRATORY_TRACT
  Filled 2013-06-14: qty 3

## 2013-06-14 MED ORDER — ALBUTEROL SULFATE (2.5 MG/3ML) 0.083% IN NEBU
5.0000 mg | INHALATION_SOLUTION | Freq: Once | RESPIRATORY_TRACT | Status: DC
  Filled 2013-06-14: qty 6

## 2013-06-14 MED ORDER — IPRATROPIUM-ALBUTEROL 0.5-2.5 (3) MG/3ML IN SOLN: 3.0000 mL | Freq: Once | RESPIRATORY_TRACT | Status: DC

## 2013-06-14 MED ORDER — IPRATROPIUM BROMIDE 0.02 % IN SOLN
0.5000 mg | Freq: Once | RESPIRATORY_TRACT | Status: DC
Start: 2013-06-14 — End: 2013-06-14

## 2013-06-14 MED ORDER — ALBUTEROL SULFATE (2.5 MG/3ML) 0.083% IN NEBU
2.5000 mg | INHALATION_SOLUTION | Freq: Once | RESPIRATORY_TRACT | Status: AC
  Administered 2013-06-14: 2.5 mg via RESPIRATORY_TRACT
  Filled 2013-06-14: qty 3

## 2013-06-14 NOTE — ED Provider Notes (Signed)
CSN: [id number]     Arrival date & time 06/14/13  1717 History   First MD Initiated Contact with Patient 06/14/13 1725     Chief Complaint  Patient presents with  . Cough     (Consider location/radiation/quality/duration/timing/severity/associated sxs/prior Treatment) HPI Comments: Pt states that he has had cough, vomiting and diarrhea for the last couple of days. Denies vomiting today. Pt states that his is having a flare of his asthma. Denies fever. Pt states that he doesn't know what his cd4 count is but they have not recommended medications for the pt  The history is provided by the patient. No language interpreter was used.    Past Medical History  Diagnosis Date  . Asthma   . HIV (human immunodeficiency virus infection)     "dx'd ~ 2 yr ago" (09/29/2012)  . Mental disorder   . Anxiety   . Depression   . Chronic low back pain   . Alcohol abuse    Past Surgical History  Procedure Laterality Date  . Skin graft full thickness leg Left ?    POSTERIOR LEFT LEG  AFTER DOG BITES   No family history on file. History  Substance Use Topics  . Smoking status: Current Every Day Smoker -- 0.50 packs/day for 27 years    Types: Cigarettes  . Smokeless tobacco: Never Used  . Alcohol Use: No     Comment: In [facility] - off of alcohol since 05/25/13    Review of Systems  Constitutional: Negative.   Respiratory: Negative.   Cardiovascular: Negative.       Allergies  Shellfish allergy  Home Medications   Current Outpatient Rx  Name  Route  Sig  Dispense  Refill  . albuterol (PROVENTIL HFA;VENTOLIN HFA) 108 (90 BASE) MCG/ACT inhaler   Inhalation   Inhale 2 puffs into the lungs every 4 (four) hours as needed for wheezing or shortness of breath.   1 Inhaler   2   . beclomethasone (QVAR) 80 MCG/ACT inhaler   Inhalation   Inhale 1 puff into the lungs 2 (two) times daily. [facility] is flovent 1 puff twice a day   1 Inhaler   12   . citalopram (CELEXA) 10 MG  tablet   Oral   Take 1 tablet (10 mg total) by mouth daily. For depression   30 tablet   0   . QUEtiapine (SEROQUEL) 100 MG tablet   Oral   Take 100-150 mg by mouth 3 (three) times daily. Take 1 tablet (100 mg) twice daily & 1.5 tablet (150 mg) at bedtime for mood control.         . traZODone (DESYREL) 100 MG tablet   Oral   Take 1 tablet (100 mg total) by mouth at bedtime and may repeat dose one time if needed. For sleep   60 tablet   0    BP 137/93  Pulse 63  Temp(Src) 98.9 F (37.2 C) (Oral)  Resp 24  Ht 5\' 6"  (1.676 m)  Wt 207 lb (93.895 kg)  BMI 33.43 kg/m2  SpO2 97% Physical Exam  Nursing note and vitals reviewed. Constitutional: He is oriented to person, place, and time. He appears well-developed and well-nourished.  HENT:  Head: Normocephalic and atraumatic.  Right Ear: External ear normal.  Left Ear: External ear normal.  Nose: Rhinorrhea present.  Mouth/Throat: Posterior oropharyngeal erythema present.  Cardiovascular: Normal rate and regular rhythm.   Pulmonary/Chest: No respiratory distress. He has wheezes.  Musculoskeletal:  Normal range of motion.  Neurological: He is alert and oriented to person, place, and time.  Skin: Skin is warm and dry.  Psychiatric: He has a normal mood and affect.    ED Course  Procedures (including critical care time) Labs Review Labs Reviewed - No data to display Imaging Review Dg Chest 2 View  06/14/2013   CLINICAL DATA:  Cough.  Dizziness.  Asthma.  HIV.  EXAM: CHEST  2 VIEW  COMPARISON:  None.  FINDINGS: The heart size and mediastinal contours are within normal limits. Mild pulmonary hyperinflation again noted. Both lungs remain clear. No mass or lymphadenopathy identified. No evidence of pleural effusion. The visualized skeletal structures are unremarkable.  IMPRESSION: Stable exam.  COPD.  No active disease.   Electronically Signed   By: Earle Gell M.D.   On: 06/14/2013 18:21     EKG Interpretation None      MDM    Final diagnoses:  Asthma exacerbation    Pt is doing better after breathing treatment. Will send home and steroids and with new inhaler    Glendell Docker, NP 06/14/13 [phone]

## 2013-06-14 NOTE — ED Provider Notes (Signed)
Medical screening examination/treatment/procedure(s) were performed by non-physician practitioner and as supervising physician I was immediately available for consultation/collaboration.   EKG Interpretation None        Malvin Johns, MD 06/14/13 2333

## 2013-06-14 NOTE — ED Notes (Signed)
NP at bedside.

## 2013-06-14 NOTE — Discharge Instructions (Signed)
Asthma Attack Prevention Although there is no way to prevent asthma from starting, you can take steps to control the disease and reduce its symptoms. Learn about your asthma and how to control it. Take an active role to control your asthma by working with your health care provider to create and follow an asthma action plan. An asthma action plan guides you in:  Taking your medicines properly.  Avoiding things that set off your asthma or make your asthma worse (asthma triggers).  Tracking your level of asthma control.  Responding to worsening asthma.  Seeking emergency care when needed. To track your asthma, keep records of your symptoms, check your peak flow number using a handheld device that shows how well air moves out of your lungs (peak flow meter), and get regular asthma checkups.  WHAT ARE SOME WAYS TO PREVENT AN ASTHMA ATTACK?  Take medicines as directed by your health care provider.  Keep track of your asthma symptoms and level of control.  With your health care provider, write a detailed plan for taking medicines and managing an asthma attack. Then be sure to follow your action plan. Asthma is an ongoing condition that needs regular monitoring and treatment.  Identify and avoid asthma triggers. Many outdoor allergens and irritants (such as pollen, mold, cold air, and air pollution) can trigger asthma attacks. Find out what your asthma triggers are and take steps to avoid them.  Monitor your breathing. Learn to recognize warning signs of an attack, such as coughing, wheezing, or shortness of breath. Your lung function may decrease before you notice any signs or symptoms, so regularly measure and record your peak airflow with a home peak flow meter.  Identify and treat attacks early. If you act quickly, you are less likely to have a severe attack. You will also need less medicine to control your symptoms. When your peak flow measurements decrease and alert you to an upcoming attack,  take your medicine as instructed and immediately stop any activity that may have triggered the attack. If your symptoms do not improve, get medical help.  Pay attention to increasing quick-relief inhaler use. If you find yourself relying on your quick-relief inhaler, your asthma is not under control. See your health care provider about adjusting your treatment. WHAT CAN MAKE MY SYMPTOMS WORSE? A number of common things can set off or make your asthma symptoms worse and cause temporary increased inflammation of your airways. Keep track of your asthma symptoms for several weeks, detailing all the environmental and emotional factors that are linked with your asthma. When you have an asthma attack, go back to your asthma diary to see which factor, or combination of factors, might have contributed to it. Once you know what these factors are, you can take steps to control many of them. If you have allergies and asthma, it is important to take asthma prevention steps at home. Minimizing contact with the substance to which you are allergic will help prevent an asthma attack. Some triggers and ways to avoid these triggers are: Animal Dander:  Some people are allergic to the flakes of skin or dried saliva from animals with fur or feathers.   There is no such thing as a hypoallergenic dog or cat breed. All dogs or cats can cause allergies, even if they don't shed.  Keep these pets out of your home.  If you are not able to keep a pet outdoors, keep the pet out of your bedroom and other sleeping areas at all   times, and keep the door closed.  Remove carpets and furniture covered with cloth from your home. If that is not possible, keep the pet away from fabric-covered furniture and carpets. Dust Mites: Many people with asthma are allergic to dust mites. Dust mites are tiny bugs that are found in every home in mattresses, pillows, carpets, fabric-covered furniture, bedcovers, clothes, stuffed toys, and other  fabric-covered items.   Cover your mattress in a special dust-proof cover.  Cover your pillow in a special dust-proof cover, or wash the pillow each week in hot water. Water must be hotter than 130 F (54.4 C) to kill dust mites. Cold or warm water used with detergent and bleach can also be effective.  Wash the sheets and blankets on your bed each week in hot water.  Try not to sleep or lie on cloth-covered cushions.  Call ahead when traveling and ask for a smoke-free hotel room. Bring your own bedding and pillows in case the hotel only supplies feather pillows and down comforters, which may contain dust mites and cause asthma symptoms.  Remove carpets from your bedroom and those laid on concrete, if you can.  Keep stuffed toys out of the bed, or wash the toys weekly in hot water or cooler water with detergent and bleach. Cockroaches: Many people with asthma are allergic to the droppings and remains of cockroaches.   Keep food and garbage in closed containers. Never leave food out.  Use poison baits, traps, powders, gels, or paste (for example, boric acid).  If a spray is used to kill cockroaches, stay out of the room until the odor goes away. Indoor Mold:  Fix leaky faucets, pipes, or other sources of water that have mold around them.  Clean floors and moldy surfaces with a fungicide or diluted bleach.  Avoid using humidifiers, vaporizers, or swamp coolers. These can spread molds through the air. Pollen and Outdoor Mold:  When pollen or mold spore counts are high, try to keep your windows closed.  Stay indoors with windows closed from late morning to afternoon. Pollen and some mold spore counts are highest at that time.  Ask your health care provider whether you need to take anti-inflammatory medicine or increase your dose of the medicine before your allergy season starts. Other Irritants to Avoid:  Tobacco smoke is an irritant. If you smoke, ask your health care provider how  you can quit. Ask family members to quit smoking too. Do not allow smoking in your home or car.  If possible, do not use a wood-burning stove, kerosene heater, or fireplace. Minimize exposure to all sources of smoke, including to incense, candles, fires, and fireworks.  Try to stay away from strong odors and sprays, such as perfume, talcum powder, hair spray, and paints.  Decrease humidity in your home and use an indoor air cleaning device. Reduce indoor humidity to below 60%. Dehumidifiers or central air conditioners can do this.  Decrease house dust exposure by changing furnace and air cooler filters frequently.  Try to have someone else vacuum for you once or twice a week. Stay out of rooms while they are being vacuumed and for a short while afterward.  If you vacuum, use a dust mask from a hardware store, a double-layered or microfilter vacuum cleaner bag, or a vacuum cleaner with a HEPA filter.  Sulfites in foods and beverages can be irritants. Do not drink beer or wine or eat dried fruit, processed potatoes, or shrimp if they cause asthma symptoms.    Cold air can trigger an asthma attack. Cover your nose and mouth with a scarf on cold or windy days.  Several health conditions can make asthma more difficult to manage, including a runny nose, sinus infections, reflux disease, psychological stress, and sleep apnea. Work with your health care provider to manage these conditions.  Avoid close contact with people who have a respiratory infection such as a cold or the flu, since your asthma symptoms may get worse if you catch the infection. Wash your hands thoroughly after touching items that may have been handled by people with a respiratory infection.  Get a flu shot every year to protect against the flu virus, which often makes asthma worse for days or weeks. Also get a pneumonia shot if you have not previously had one. Unlike the flu shot, the pneumonia shot does not need to be given  yearly. Medicines:  Talk to your health care provider about whether it is safe for you to take aspirin or non-steroidal anti-inflammatory medicines (NSAIDs). In a small number of people with asthma, aspirin and NSAIDs can cause asthma attacks. These medicines must be avoided by people who have known aspirin-sensitive asthma. It is important that people with aspirin-sensitive asthma read labels of all over-the-counter medicines used to treat pain, colds, coughs, and fever.  Beta blockers and ACE inhibitors are other medicines you should discuss with your health care provider. HOW CAN I FIND OUT WHAT I AM ALLERGIC TO? Ask your asthma health care provider about allergy skin testing or blood testing (the RAST test) to identify the allergens to which you are sensitive. If you are found to have allergies, the most important thing to do is to try to avoid exposure to any allergens that you are sensitive to as much as possible. Other treatments for allergies, such as medicines and allergy shots (immunotherapy) are available.  CAN I EXERCISE? Follow your health care provider's advice regarding asthma treatment before exercising. It is important to maintain a regular exercise program, but vigorous exercise, or exercise in cold, humid, or dry environments can cause asthma attacks, especially for those people who have exercise-induced asthma. Document Released: 02/17/2009 Document Revised: 11/01/2012 Document Reviewed: 09/06/2012 [employer] Patient Information 2014 [employer].  

## 2013-06-14 NOTE — ED Notes (Signed)
Cough and shakiness x2 days.  Pt reports some vomiting.  Is from [facility].

## 2013-06-16 ENCOUNTER — Emergency Department (HOSPITAL_BASED_OUTPATIENT_CLINIC_OR_DEPARTMENT_OTHER)
Admission: EM | Admit: 2013-06-16 | Discharge: 2013-06-16 | Disposition: A | Discharge status: Home / Self Care | Payer: Self-pay | Attending: Emergency Medicine | Admitting: Emergency Medicine

## 2013-06-16 ENCOUNTER — Emergency Department (HOSPITAL_BASED_OUTPATIENT_CLINIC_OR_DEPARTMENT_OTHER): Payer: Self-pay

## 2013-06-16 ENCOUNTER — Encounter (HOSPITAL_BASED_OUTPATIENT_CLINIC_OR_DEPARTMENT_OTHER): Payer: Self-pay | Admitting: Emergency Medicine

## 2013-06-16 DIAGNOSIS — J45901 Unspecified asthma with (acute) exacerbation: Secondary | ICD-10-CM

## 2013-06-16 DIAGNOSIS — F17200 Nicotine dependence, unspecified, uncomplicated: Secondary | ICD-10-CM | POA: Insufficient documentation

## 2013-06-16 DIAGNOSIS — F411 Generalized anxiety disorder: Secondary | ICD-10-CM | POA: Insufficient documentation

## 2013-06-16 DIAGNOSIS — J441 Chronic obstructive pulmonary disease with (acute) exacerbation: Secondary | ICD-10-CM | POA: Insufficient documentation

## 2013-06-16 DIAGNOSIS — Z79899 Other long term (current) drug therapy: Secondary | ICD-10-CM | POA: Insufficient documentation

## 2013-06-16 DIAGNOSIS — F329 Major depressive disorder, single episode, unspecified: Secondary | ICD-10-CM | POA: Insufficient documentation

## 2013-06-16 DIAGNOSIS — IMO0002 Reserved for concepts with insufficient information to code with codable children: Secondary | ICD-10-CM | POA: Insufficient documentation

## 2013-06-16 DIAGNOSIS — X58XXXA Exposure to other specified factors, initial encounter: Secondary | ICD-10-CM | POA: Insufficient documentation

## 2013-06-16 DIAGNOSIS — F3289 Other specified depressive episodes: Secondary | ICD-10-CM | POA: Insufficient documentation

## 2013-06-16 DIAGNOSIS — Y929 Unspecified place or not applicable: Secondary | ICD-10-CM | POA: Insufficient documentation

## 2013-06-16 DIAGNOSIS — Y939 Activity, unspecified: Secondary | ICD-10-CM | POA: Insufficient documentation

## 2013-06-16 DIAGNOSIS — Z21 Asymptomatic human immunodeficiency virus [HIV] infection status: Secondary | ICD-10-CM | POA: Insufficient documentation

## 2013-06-16 DIAGNOSIS — G8929 Other chronic pain: Secondary | ICD-10-CM | POA: Insufficient documentation

## 2013-06-16 DIAGNOSIS — S2232XA Fracture of one rib, left side, initial encounter for closed fracture: Secondary | ICD-10-CM

## 2013-06-16 DIAGNOSIS — S2239XA Fracture of one rib, unspecified side, initial encounter for closed fracture: Secondary | ICD-10-CM | POA: Insufficient documentation

## 2013-06-16 LAB — URINALYSIS, ROUTINE W REFLEX MICROSCOPIC
BILIRUBIN URINE: NEGATIVE
Glucose, UA: NEGATIVE mg/dL
Hgb urine dipstick: NEGATIVE
Ketones, ur: NEGATIVE mg/dL
Leukocytes, UA: NEGATIVE
Nitrite: NEGATIVE
PH: 5.5 (ref 5.0–8.0)
Protein, ur: NEGATIVE mg/dL
SPECIFIC GRAVITY, URINE: 1.023 (ref 1.005–1.030)
UROBILINOGEN UA: 0.2 mg/dL (ref 0.0–1.0)

## 2013-06-16 MED ORDER — OXYCODONE-ACETAMINOPHEN 5-325 MG PO TABS: 1.0000 | ORAL_TABLET | Freq: Four times a day (QID) | ORAL | Status: DC | PRN

## 2013-06-16 MED ORDER — OXYCODONE-ACETAMINOPHEN 5-325 MG PO TABS
1.0000 | ORAL_TABLET | Freq: Once | ORAL | Status: AC
  Administered 2013-06-16: 1 via ORAL
  Filled 2013-06-16: qty 1

## 2013-06-16 NOTE — ED Provider Notes (Addendum)
CSN: [id number]     Arrival date & time 06/16/13  0806 History   First MD Initiated Contact with Patient 06/16/13 [phone]     No chief complaint on file.    (Consider location/radiation/quality/duration/timing/severity/associated sxs/prior Treatment) Patient is a 43 y.o. male presenting with chest pain. The history is provided by the patient.  Chest Pain Pain location:  L chest Pain quality: sharp and stabbing   Pain radiates to:  Does not radiate Pain radiates to the back: no   Pain severity:  Severe Onset quality:  Sudden Duration:  12 hours Timing:  Constant Progression:  Unchanged Chronicity:  New Context comment:  Started after a coughing spell Relieved by:  Nothing Worsened by:  Certain positions, coughing and deep breathing Ineffective treatments:  Rest Associated symptoms: cough   Associated symptoms: no abdominal pain, no fever, no nausea, no shortness of breath and not vomiting   Associated symptoms comment:  Chronic cough due to asthma/copd.  Using inhalers at home and no current wheezing or SOB Risk factors: smoking   Risk factors comment:  Copd/asthma   Past Medical History  Diagnosis Date  . Asthma   . HIV (human immunodeficiency virus infection)     "dx'd ~ 2 yr ago" (09/29/2012)  . Mental disorder   . Anxiety   . Depression   . Chronic low back pain   . Alcohol abuse    Past Surgical History  Procedure Laterality Date  . Skin graft full thickness leg Left ?    POSTERIOR LEFT LEG  AFTER DOG BITES   No family history on file. History  Substance Use Topics  . Smoking status: Current Every Day Smoker -- 0.50 packs/day for 27 years    Types: Cigarettes  . Smokeless tobacco: Never Used  . Alcohol Use: No     Comment: In [facility] - off of alcohol since 05/25/13    Review of Systems  Constitutional: Negative for fever.  Respiratory: Positive for cough. Negative for shortness of breath.   Cardiovascular: Positive for chest pain.  Gastrointestinal: Negative  for nausea, vomiting and abdominal pain.  All other systems reviewed and are negative.      Allergies  Shellfish allergy  Home Medications   Current Outpatient Rx  Name  Route  Sig  Dispense  Refill  . albuterol (PROVENTIL HFA;VENTOLIN HFA) 108 (90 BASE) MCG/ACT inhaler   Inhalation   Inhale 2 puffs into the lungs every 4 (four) hours as needed for wheezing or shortness of breath.   1 Inhaler   2   . beclomethasone (QVAR) 80 MCG/ACT inhaler   Inhalation   Inhale 1 puff into the lungs 2 (two) times daily. [facility] sub is flovent 1 puff twice a day   1 Inhaler   12   . citalopram (CELEXA) 10 MG tablet   Oral   Take 1 tablet (10 mg total) by mouth daily. For depression   30 tablet   0   . predniSONE (DELTASONE) 10 MG tablet   Oral   Take 4 tablets (40 mg total) by mouth daily.   10 tablet   0   . QUEtiapine (SEROQUEL) 100 MG tablet   Oral   Take 100-150 mg by mouth 3 (three) times daily. Take 1 tablet (100 mg) twice daily & 1.5 tablet (150 mg) at bedtime for mood control.         . traZODone (DESYREL) 100 MG tablet   Oral   Take 1 tablet (100  mg total) by mouth at bedtime and may repeat dose one time if needed. For sleep   60 tablet   0    BP 133/69  Pulse 97  Temp(Src) 97.9 F (36.6 C) (Oral)  Resp 24  SpO2 100% Physical Exam  Nursing note and vitals reviewed. Constitutional: He is oriented to person, place, and time. He appears well-developed and well-nourished. No distress.  HENT:  Head: Normocephalic and atraumatic.  Mouth/Throat: Oropharynx is clear and moist.  Eyes: Conjunctivae and EOM are normal. Pupils are equal, round, and reactive to light.  Neck: Normal range of motion. Neck supple.  Cardiovascular: Normal rate, regular rhythm and intact distal pulses.   No murmur heard. Pulmonary/Chest: Effort normal and breath sounds normal. No respiratory distress. He has no wheezes. He has no rales. He exhibits tenderness. He exhibits no  crepitus.    Abdominal: Soft. He exhibits no distension. There is no tenderness. There is no rebound and no guarding.  Musculoskeletal: Normal range of motion. He exhibits no edema and no tenderness.  Neurological: He is alert and oriented to person, place, and time.  Skin: Skin is warm and dry. No rash noted. No erythema.  Psychiatric: He has a normal mood and affect. His behavior is normal.    ED Course  Procedures (including critical care time) Labs Review Labs Reviewed  URINALYSIS, ROUTINE W REFLEX MICROSCOPIC   Imaging Review Dg Chest 2 View  06/14/2013   CLINICAL DATA:  Cough.  Dizziness.  Asthma.  HIV.  EXAM: CHEST  2 VIEW  COMPARISON:  None.  FINDINGS: The heart size and mediastinal contours are within normal limits. Mild pulmonary hyperinflation again noted. Both lungs remain clear. No mass or lymphadenopathy identified. No evidence of pleural effusion. The visualized skeletal structures are unremarkable.  IMPRESSION: Stable exam.  COPD.  No active disease.   Electronically Signed   By: Earle Gell M.D.   On: 06/14/2013 18:21   Dg Ribs Unilateral W/chest Left  06/16/2013   ADDENDUM REPORT: 06/16/2013 09:27  ADDENDUM: Correction: Three views of the left ribs submitted. Cortical irregularity of the left posterior ninth rib.   Electronically Signed   By: Lahoma Crocker M.D.   On: 06/16/2013 09:27   06/16/2013   CLINICAL DATA:  Anterior rib pain, cough  EXAM: LEFT RIBS AND CHEST - 3+ VIEW  COMPARISON:  06/14/2013  FINDINGS: Hyperinflation again noted. Cardiomediastinal silhouette is stable. No acute infiltrate or pulmonary edema. There is irregularity of the right posterior ninth rib which may be due to fracture of indeterminate age. No pneumothorax.  IMPRESSION: No acute infiltrate or pulmonary edema. Irregularity of right posterior ninth rib which may be due to fracture of indeterminate age. Clinical correlation is necessary.  Electronically Signed: By: Lahoma Crocker M.D. On: 06/16/2013 09:13      EKG Interpretation None      MDM   Final diagnoses:  Left rib fracture   Patient with a history of asthma and COPD presents today do to severe left rib pain after a coughing episode yesterday. He denies shortness of breath or wheezing or exacerbation of his COPD. He has been using his inhalers and feels that he is breathing okay however the pain is not improving.  Breath sounds bilaterally and point tenderness over the left lower ribs. He has no abdominal tenderness or abdominal symptoms at this time. Currently patient [facility] for alcohol recovery.  Will get CXR and rib films to r/o fracture.  9:31 AM CXR  consistent the 9th rib fx and that is exactly where pt is hurting.  Given return precautions and treated pain.    Blanchie Dessert, MD 06/16/13 [place], MD 06/16/13 [phone]

## 2013-06-16 NOTE — Discharge Instructions (Signed)
Rib Fracture  A rib fracture is a break or crack in one of the bones of the ribs. The ribs are a group of long, curved bones that wrap around your chest and attach to your spine. They protect your lungs and other organs in the chest cavity. A broken or cracked rib is often painful, but most do not cause other problems. Most rib fractures heal on their own over time. However, rib fractures can be more serious if multiple ribs are broken or if broken ribs move out of place and push against other structures.  CAUSES   · A direct blow to the chest. For example, this could happen during contact sports, a car accident, or a fall against a hard object.  · Repetitive movements with high force, such as pitching a baseball or having severe coughing spells.  SYMPTOMS   · Pain when you breathe in or cough.  · Pain when someone presses on the injured area.  DIAGNOSIS   Your caregiver will perform a physical exam. Various imaging tests may be ordered to confirm the diagnosis and to look for related injuries. These tests may include a chest X-ray, computed tomography (CT), magnetic resonance imaging (MRI), or a bone scan.  TREATMENT   Rib fractures usually heal on their own in 1 3 months. The longer healing period is often associated with a continued cough or other aggravating activities. During the healing period, pain control is very important. Medication is usually given to control pain. Hospitalization or surgery may be needed for more severe injuries, such as those in which multiple ribs are broken or the ribs have moved out of place.   HOME CARE INSTRUCTIONS   · Avoid strenuous activity and any activities or movements that cause pain. Be careful during activities and avoid bumping the injured rib.  · Gradually increase activity as directed by your caregiver.  · Only take over-the-counter or prescription medications as directed by your caregiver. Do not take other medications without asking your caregiver first.  · Apply ice  to the injured area for the first 1 2 days after you have been treated or as directed by your caregiver. Applying ice helps to reduce inflammation and pain.  · Put ice in a plastic bag.  · Place a towel between your skin and the bag.    · Leave the ice on for 15 20 minutes at a time, every 2 hours while you are awake.  · Perform deep breathing as directed by your caregiver. This will help prevent pneumonia, which is a common complication of a broken rib. Your caregiver may instruct you to:  · Take deep breaths several times a day.  · Try to cough several times a day, holding a pillow against the injured area.  · Use a device called an incentive spirometer to practice deep breathing several times a day.  · Drink enough fluids to keep your urine clear or pale yellow. This will help you avoid constipation.    · Do not wear a rib belt or binder. These restrict breathing, which can lead to pneumonia.    SEEK IMMEDIATE MEDICAL CARE IF:   · You have a fever.    · You have difficulty breathing or shortness of breath.    · You develop a continual cough, or you cough up thick or bloody sputum.  · You feel sick to your stomach (nausea), throw up (vomit), or have abdominal pain.    · You have worsening pain not controlled with medications.      MAKE SURE YOU:  · Understand these instructions.  · Will watch your condition.  · Will get help right away if you are not doing well or get worse.  Document Released: 03/01/2005 Document Revised: 11/01/2012 Document Reviewed: 05/03/2012  Exit[employer]® Patient Information ©2014 [employer].

## 2013-07-23 ENCOUNTER — Emergency Department (HOSPITAL_COMMUNITY)
Admission: EM | Admit: 2013-07-23 | Discharge: 2013-07-23 | Disposition: A | Discharge status: Home / Self Care | Payer: Self-pay | Attending: Emergency Medicine | Admitting: Emergency Medicine

## 2013-07-23 ENCOUNTER — Encounter (HOSPITAL_COMMUNITY): Payer: Self-pay | Admitting: Emergency Medicine

## 2013-07-23 DIAGNOSIS — Z79899 Other long term (current) drug therapy: Secondary | ICD-10-CM | POA: Insufficient documentation

## 2013-07-23 DIAGNOSIS — F3289 Other specified depressive episodes: Secondary | ICD-10-CM | POA: Insufficient documentation

## 2013-07-23 DIAGNOSIS — X58XXXA Exposure to other specified factors, initial encounter: Secondary | ICD-10-CM | POA: Insufficient documentation

## 2013-07-23 DIAGNOSIS — T148XXA Other injury of unspecified body region, initial encounter: Secondary | ICD-10-CM

## 2013-07-23 DIAGNOSIS — Y929 Unspecified place or not applicable: Secondary | ICD-10-CM | POA: Insufficient documentation

## 2013-07-23 DIAGNOSIS — F329 Major depressive disorder, single episode, unspecified: Secondary | ICD-10-CM | POA: Insufficient documentation

## 2013-07-23 DIAGNOSIS — F17200 Nicotine dependence, unspecified, uncomplicated: Secondary | ICD-10-CM | POA: Insufficient documentation

## 2013-07-23 DIAGNOSIS — J45909 Unspecified asthma, uncomplicated: Secondary | ICD-10-CM | POA: Insufficient documentation

## 2013-07-23 DIAGNOSIS — F411 Generalized anxiety disorder: Secondary | ICD-10-CM | POA: Insufficient documentation

## 2013-07-23 DIAGNOSIS — Z21 Asymptomatic human immunodeficiency virus [HIV] infection status: Secondary | ICD-10-CM | POA: Insufficient documentation

## 2013-07-23 DIAGNOSIS — Y9389 Activity, other specified: Secondary | ICD-10-CM | POA: Insufficient documentation

## 2013-07-23 DIAGNOSIS — IMO0002 Reserved for concepts with insufficient information to code with codable children: Secondary | ICD-10-CM | POA: Insufficient documentation

## 2013-07-23 DIAGNOSIS — G8929 Other chronic pain: Secondary | ICD-10-CM | POA: Insufficient documentation

## 2013-07-23 MED ORDER — IBUPROFEN 600 MG PO TABS: 600.0000 mg | ORAL_TABLET | Freq: Four times a day (QID) | ORAL | Status: DC | PRN

## 2013-07-23 NOTE — ED Provider Notes (Signed)
Medical screening examination/treatment/procedure(s) were performed by non-physician practitioner and as supervising physician I was immediately available for consultation/collaboration.   EKG Interpretation None        Ezequiel Essex, MD 07/23/13 [phone]

## 2013-07-23 NOTE — ED Provider Notes (Signed)
CSN: [id number]     Arrival date & time 07/23/13  4098 History   First MD Initiated Contact with Patient 07/23/13 0830     Chief Complaint  Patient presents with  . Flank Pain     (Consider location/radiation/quality/duration/timing/severity/associated sxs/prior Treatment) HPI Comments: Patient presents to the ED with a chief complaint of new, constant, right flank pain after sneezing really hard yesterday. He also states the pain is worsened with coughing. Denies any pain with urination. Denies any fevers chills. He has not taken anything to alleviate his symptoms. He is not in any apparent distress.  The history is provided by the patient. No language interpreter was used.    Past Medical History  Diagnosis Date  . Asthma   . HIV (human immunodeficiency virus infection)     "dx'd ~ 2 yr ago" (09/29/2012)  . Mental disorder   . Anxiety   . Depression   . Chronic low back pain   . Alcohol abuse    Past Surgical History  Procedure Laterality Date  . Skin graft full thickness leg Left ?    POSTERIOR LEFT LEG  AFTER DOG BITES   History reviewed. No pertinent family history. History  Substance Use Topics  . Smoking status: Current Every Day Smoker -- 0.50 packs/day for 27 years    Types: Cigarettes  . Smokeless tobacco: Never Used  . Alcohol Use: No     Comment: In [facility] - off of alcohol since 05/25/13    Review of Systems  Constitutional: Negative for fever and chills.  Respiratory: Negative for shortness of breath.   Cardiovascular: Negative for chest pain.  Gastrointestinal: Negative for nausea, vomiting, diarrhea and constipation.  Genitourinary: Negative for dysuria.  Musculoskeletal: Positive for back pain.      Allergies  Shellfish allergy  Home Medications   Prior to Admission medications   Medication Sig Start Date End Date Taking? Authorizing Provider  albuterol (PROVENTIL HFA;VENTOLIN HFA) 108 (90 BASE) MCG/ACT inhaler Inhale 2 puffs into the lungs  every 4 (four) hours as needed for wheezing or shortness of breath. 05/15/13  Yes Encarnacion Slates, NP  beclomethasone (QVAR) 80 MCG/ACT inhaler Inhale 1 puff into the lungs 2 (two) times daily. [facility] sub is flovent 1 puff twice a day 05/15/13  Yes Nicholaus Bloom, MD  oxyCODONE-acetaminophen (PERCOCET/ROXICET) 5-325 MG per tablet Take 1-2 tablets by mouth every 6 (six) hours as needed for severe pain. 06/16/13  Yes Blanchie Dessert, MD  predniSONE (DELTASONE) 10 MG tablet Take 4 tablets (40 mg total) by mouth daily. 06/14/13  Yes Glendell Docker, NP  QUEtiapine (SEROQUEL) 100 MG tablet Take 100-150 mg by mouth 3 (three) times daily. Take 1 tablet (100 mg) twice daily & 1.5 tablet (150 mg) at bedtime for mood control. 05/15/13  Yes Encarnacion Slates, NP  traZODone (DESYREL) 100 MG tablet Take 1 tablet (100 mg total) by mouth at bedtime and may repeat dose one time if needed. For sleep 05/15/13  Yes Encarnacion Slates, NP  ibuprofen (ADVIL,MOTRIN) 600 MG tablet Take 1 tablet (600 mg total) by mouth every 6 (six) hours as needed. 07/23/13   Montine Circle, PA-C   BP 133/75  Pulse 100  Temp(Src) 98.1 F (36.7 C) (Oral)  Resp 20  SpO2 98% Physical Exam  Nursing note and vitals reviewed. Constitutional: He is oriented to person, place, and time. He appears well-developed and well-nourished. No distress.  HENT:  Head: Normocephalic and atraumatic.  Eyes: Conjunctivae  and EOM are normal. Right eye exhibits no discharge. Left eye exhibits no discharge. No scleral icterus.  Neck: Normal range of motion. Neck supple. No tracheal deviation present.  Cardiovascular: Normal rate, regular rhythm and normal heart sounds.  Exam reveals no gallop and no friction rub.   No murmur heard. Pulmonary/Chest: Effort normal and breath sounds normal. No respiratory distress. He has no wheezes.  Abdominal: Soft. He exhibits no distension. There is no tenderness.  Musculoskeletal: Normal range of motion.  No paraspinal muscles  tenderness to palpation, no bony tenderness, step-offs, or gross abnormality or deformity of spine, patient is able to ambulate, moves all extremities  Right sided latissimus dorsi tenderness  Bilateral great toe extension intact Bilateral plantar/dorsiflexion intact  Neurological: He is alert and oriented to person, place, and time. He has normal reflexes.  Sensation and strength intact bilaterally Symmetrical reflexes  Skin: Skin is warm. He is not diaphoretic.  Psychiatric: He has a normal mood and affect. His behavior is normal. Judgment and thought content normal.    ED Course  Procedures (including critical care time) Labs Review Labs Reviewed - No data to display  Imaging Review No results found.   EKG Interpretation None      MDM   Final diagnoses:  Muscle strain   Patient with muscle strain after sneezing.      Montine Circle, PA-C 07/23/13 [phone]

## 2013-07-23 NOTE — ED Notes (Signed)
Pt discharged home with all belongings, pt alert, oriented, and ambulatory upon discharge, 1 new RX prescribed, pt verbalizes understanding of discharge instructions, pt used bus pass for transportation home

## 2013-07-23 NOTE — Discharge Instructions (Signed)

## 2013-07-23 NOTE — ED Notes (Signed)
Pt presents with Right side pain after sneezing "raelly hard" yesterday. Pt denies any problems with bowel and/or bladder

## 2013-07-24 ENCOUNTER — Emergency Department (HOSPITAL_COMMUNITY)
Admission: EM | Admit: 2013-07-24 | Discharge: 2013-07-24 | Disposition: A | Discharge status: Home / Self Care | Payer: Self-pay | Attending: Emergency Medicine | Admitting: Emergency Medicine

## 2013-07-24 ENCOUNTER — Encounter (HOSPITAL_COMMUNITY): Payer: Self-pay | Admitting: Emergency Medicine

## 2013-07-24 ENCOUNTER — Emergency Department (HOSPITAL_COMMUNITY): Payer: Self-pay

## 2013-07-24 DIAGNOSIS — J45909 Unspecified asthma, uncomplicated: Secondary | ICD-10-CM | POA: Insufficient documentation

## 2013-07-24 DIAGNOSIS — F17200 Nicotine dependence, unspecified, uncomplicated: Secondary | ICD-10-CM | POA: Insufficient documentation

## 2013-07-24 DIAGNOSIS — F411 Generalized anxiety disorder: Secondary | ICD-10-CM | POA: Insufficient documentation

## 2013-07-24 DIAGNOSIS — F329 Major depressive disorder, single episode, unspecified: Secondary | ICD-10-CM | POA: Insufficient documentation

## 2013-07-24 DIAGNOSIS — IMO0002 Reserved for concepts with insufficient information to code with codable children: Secondary | ICD-10-CM | POA: Insufficient documentation

## 2013-07-24 DIAGNOSIS — F1210 Cannabis abuse, uncomplicated: Secondary | ICD-10-CM | POA: Insufficient documentation

## 2013-07-24 DIAGNOSIS — F10929 Alcohol use, unspecified with intoxication, unspecified: Secondary | ICD-10-CM

## 2013-07-24 DIAGNOSIS — F3289 Other specified depressive episodes: Secondary | ICD-10-CM | POA: Insufficient documentation

## 2013-07-24 DIAGNOSIS — Z21 Asymptomatic human immunodeficiency virus [HIV] infection status: Secondary | ICD-10-CM | POA: Insufficient documentation

## 2013-07-24 DIAGNOSIS — G8929 Other chronic pain: Secondary | ICD-10-CM | POA: Insufficient documentation

## 2013-07-24 DIAGNOSIS — F1010 Alcohol abuse, uncomplicated: Secondary | ICD-10-CM | POA: Insufficient documentation

## 2013-07-24 DIAGNOSIS — Z79899 Other long term (current) drug therapy: Secondary | ICD-10-CM | POA: Insufficient documentation

## 2013-07-24 LAB — RAPID URINE DRUG SCREEN, HOSP PERFORMED
AMPHETAMINES: NOT DETECTED
Barbiturates: NOT DETECTED
Benzodiazepines: NOT DETECTED
Cocaine: NOT DETECTED
Opiates: NOT DETECTED
TETRAHYDROCANNABINOL: POSITIVE — AB

## 2013-07-24 LAB — CBC
HCT: 39.8 % (ref 39.0–52.0)
Hemoglobin: 14.1 g/dL (ref 13.0–17.0)
MCH: 31.5 pg (ref 26.0–34.0)
MCHC: 35.4 g/dL (ref 30.0–36.0)
MCV: 88.8 fL (ref 78.0–100.0)
Platelets: 316 10*3/uL (ref 150–400)
RBC: 4.48 MIL/uL (ref 4.22–5.81)
RDW: 12.9 % (ref 11.5–15.5)
WBC: 5.8 10*3/uL (ref 4.0–10.5)

## 2013-07-24 LAB — COMPREHENSIVE METABOLIC PANEL
ALT: 19 U/L (ref 0–53)
AST: 27 U/L (ref 0–37)
Albumin: 4 g/dL (ref 3.5–5.2)
Alkaline Phosphatase: 93 U/L (ref 39–117)
BILIRUBIN TOTAL: 0.3 mg/dL (ref 0.3–1.2)
BUN: 14 mg/dL (ref 6–23)
CHLORIDE: 105 meq/L (ref 96–112)
CO2: 20 mEq/L (ref 19–32)
Calcium: 9 mg/dL (ref 8.4–10.5)
Creatinine, Ser: 1.03 mg/dL (ref 0.50–1.35)
GFR calc Af Amer: >90 mL/min (ref >90)
GFR calc non Af Amer: 88 mL/min — ABNORMAL LOW (ref >90)
Glucose, Bld: 121 mg/dL — ABNORMAL HIGH (ref 70–99)
POTASSIUM: 3.8 meq/L (ref 3.7–5.3)
Sodium: 144 mEq/L (ref 137–147)
Total Protein: 8 g/dL (ref 6.0–8.3)

## 2013-07-24 LAB — ETHANOL: Alcohol, Ethyl (B): 212 mg/dL — ABNORMAL HIGH (ref 0–11)

## 2013-07-24 MED ORDER — TRAZODONE HCL 100 MG PO TABS: 100.0000 mg | ORAL_TABLET | Freq: Every day | ORAL | Status: DC

## 2013-07-24 MED ORDER — QUETIAPINE FUMARATE 100 MG PO TABS: ORAL_TABLET | ORAL | Status: DC

## 2013-07-24 NOTE — ED Notes (Signed)
Pt has in belonging bag:  Gray swim trunks, white t-shirt, white socks, black tennis shoes, Swiss army book bag, light green small bag.

## 2013-07-24 NOTE — ED Notes (Signed)
Pt has also been off his medications for one month

## 2013-07-24 NOTE — ED Notes (Signed)
Dr Eulis Foster in to see pt; pt advises MD that he is not suicidal and has not thoughts if harming himself; pt states that he will be able to go home after a breakfast tray this am.

## 2013-07-24 NOTE — Discharge Instructions (Signed)
Avoid drinking alcohol. Followup with a primary care Dr., and a therapist, as soon as possible to get further treatment.     Alcohol Intoxication Alcohol intoxication occurs when the amount of alcohol that a person has consumed impairs his or her ability to mentally and physically function. Alcohol directly impairs the normal chemical activity of the brain. Drinking large amounts of alcohol can lead to changes in mental function and behavior, and it can cause many physical effects that can be harmful.  Alcohol intoxication can range in severity from mild to very severe. Various factors can affect the level of intoxication that occurs, such as the person's age, gender, weight, frequency of alcohol consumption, and the presence of other medical conditions (such as diabetes, seizures, or heart conditions). Dangerous levels of alcohol intoxication may occur when people drink large amounts of alcohol in a short period (binge drinking). Alcohol can also be especially dangerous when combined with certain prescription medicines or "recreational" drugs. SIGNS AND SYMPTOMS Some common signs and symptoms of mild alcohol intoxication include:  Loss of coordination.  Changes in mood and behavior.  Impaired judgment.  Slurred speech. As alcohol intoxication progresses to more severe levels, other signs and symptoms will appear. These may include:  Vomiting.  Confusion and impaired memory.  Slowed breathing.  Seizures.  Loss of consciousness. DIAGNOSIS  Your health care provider will take a medical history and perform a physical exam. You will be asked about the amount and type of alcohol you have consumed. Blood tests will be done to measure the concentration of alcohol in your blood. In many places, your blood alcohol level must be lower than 80 mg/dL (0.08%) to legally drive. However, many dangerous effects of alcohol can occur at much lower levels.  TREATMENT  People with alcohol intoxication  often do not require treatment. Most of the effects of alcohol intoxication are temporary, and they go away as the alcohol naturally leaves the body. Your health care provider will monitor your condition until you are stable enough to go home. Fluids are sometimes given through an IV access tube to help prevent dehydration.  HOME CARE INSTRUCTIONS  Do not drive after drinking alcohol.  Stay hydrated. Drink enough water and fluids to keep your urine clear or pale yellow. Avoid caffeine.   Only take over-the-counter or prescription medicines as directed by your health care provider.  SEEK MEDICAL CARE IF:   You have persistent vomiting.   You do not feel better after a few days.  You have frequent alcohol intoxication. Your health care provider can help determine if you should see a substance use treatment counselor. SEEK IMMEDIATE MEDICAL CARE IF:   You become shaky or tremble when you try to stop drinking.   You shake uncontrollably (seizure).   You throw up (vomit) blood. This may be bright red or may look like black coffee grounds.   You have blood in your stool. This may be bright red or may appear as a black, tarry, bad smelling stool.   You become lightheaded or faint.  MAKE SURE YOU:   Understand these instructions.  Will watch your condition.  Will get help right away if you are not doing well or get worse. Document Released: 12/09/2004 Document Revised: 11/01/2012 Document Reviewed: 08/04/2012 [facility] Patient Information 2014 [place].   Emergency Department Resource Guide 1) Find a Doctor and Pay Out of Pocket Although you won't have to find out who is covered by your insurance plan, it is a  good idea to ask around and get recommendations. You will then need to call the office and see if the doctor you have chosen will accept you as a new patient and what types of options they offer for patients who are self-pay. Some doctors offer discounts or will set  up payment plans for their patients who do not have insurance, but you will need to ask so you aren't surprised when you get to your appointment.  2) Contact Your Local Health Department Not all health departments have doctors that can see patients for sick visits, but many do, so it is worth a call to see if yours does. If you don't know where your local health department is, you can check in your phone book. The CDC also has a tool to help you locate your state's health department, and many state websites also have listings of all of their local health departments.  3) Find a [facility] If your illness is not likely to be very severe or complicated, you may want to try a walk in clinic. These are popping up all over the country in pharmacies, drugstores, and shopping centers. They're usually staffed by nurse practitioners or physician assistants that have been trained to treat common illnesses and complaints. They're usually fairly quick and inexpensive. However, if you have serious medical issues or chronic medical problems, these are probably not your best option.  No Primary Care Doctor: - Call Health Connect at  [phone] - they can help you locate a primary care doctor that  accepts your insurance, provides certain services, etc. - Physician Referral Service- [phone]  Chronic Pain Problems: Organization         Address  Phone   Notes  [facility]  [phone] Patients need to be referred by their primary care doctor.   Medication Assistance: Organization         Address  Phone   Notes  [place] Medication [facility] [address] [place], [place] [address] [phone] --Must be a resident of [facility] -- Must have NO insurance coverage whatsoever (no Medicaid/ Medicare, etc.) -- The pt. MUST have a primary care doctor that directs their care regularly and follows them in the community   MedAssist  [phone]    [employer]  [phone]    Agencies that provide inexpensive medical care: Organization         Address  Phone   Notes  [place]  [phone]   [facility]    [phone]   [facility] [place], [place] [phone]   [place] [address], [place] [phone]   Planned Parenthood    [phone]   [facility]    [phone]   [place] and [address], [place] Phone:  [phone], Fax:  [phone] Hours of Operation:  9 am - 6 pm, M-F.  Also accepts Medicaid/Medicare and self-pay.  [facility] for [place], [address], [place] Phone: [phone], Fax: [phone]. Hours of Operation:  8:30 am - 5:30 pm, M-F.  Also accepts Medicaid and self-pay.  [facility] [place] [address] [place] Phone: [phone]   [place], [place], [place] [phone], Ext. [phone] Mondays & Thursdays: 7-9 AM.  First  15 patients are seen on a first come, first serve basis.    [place] Providers:  Organization         Address  Phone   Notes  [facility] [address], [place] [phone] Also accepts self-pay patients.  [facility] [id number] [place], [place]  [phone]   [place], [address], [place] [phone]   [facility] [address] [place] [phone]   Lucianne Lei [address], [place]   [phone] Only accepts [place] Access [place] patients after they have their name applied to their card.   Self-Pay (no insurance) in [facility]:  Organization         Address  Phone   Notes  Sickle Cell Patients, [facility] [place] [phone]   [facility] [place] [phone]   [facility] [place]  [place], [place], [place] [phone]   [facility]/Dr. Osei-Bonsu  [address], [place] or [address], [place] [phone] Phone number for both [place] and [place] locations is the same.  Urgent Medical and [facility] [address] [place] [phone]   [facility] [address], [place] or [address] [phone]   [facility] [address], [place] [phone], phone; [phone], fax Sees patients 1st and 3rd Saturday of every month.  Must not qualify for public or private insurance (i.e. Medicaid, Medicare, [place] Health Choice, Veterans' Benefits)  Household income should be no more than 200% of the poverty level The clinic cannot treat you if you are pregnant or think you are pregnant  Sexually transmitted diseases are not treated at the clinic.    Dental Care: Organization         Address  Phone  Notes  [facility] Department of [facility] [place] [phone] Accepts children up to age 1 who are enrolled in [place] or [place]; pregnant women with a Medicaid card; and children who have applied for Medicaid or [place] Health Choice, but were declined, whose parents can pay a reduced fee at time of service.  [facility] Department of [facility]  [address], [place] [phone] Accepts children up to age 3 who are enrolled in [place] or [place]; pregnant women with a Medicaid card; and children who have applied for Medicaid or [place] Health Choice, but were declined, whose parents can pay a reduced fee at time of service.  [place] Adult Dental Access PROGRAM  [place] [phone] Patients are seen by appointment only. Walk-ins are not accepted. [place] will see patients 63 years of age and older. Monday - Tuesday (8am-5pm) Most Wednesdays (8:30-5pm) $30 per visit, cash only  [facility] Adult Dental Access PROGRAM  [address], [facility] [phone] Patients are seen by appointment only. Walk-ins are not accepted. [place] will see patients 45 years of age and older. One Wednesday Evening (Monthly: Volunteer Based).  $30 per visit, cash only  [place]  [phone] for adults; Children under age 56, call Graduate Pediatric  Dentistry at [phone]. Children aged 46-14, please call [phone] to request a pediatric application.  Dental services are provided in all areas of dental care including fillings, crowns and bridges, complete and partial dentures, implants, gum treatment, root canals, and extractions. Preventive care is also provided. Treatment is provided to both adults and children. Patients are selected via a lottery and there is often a waiting list.   [facility] [address], [place]  [phone] [email or web address]   [facility] [address] [place], [place] [phone], Ext. [phone] Second and Fourth Thursday of each month, opens at 6:30 AM; Clinic ends at 9 AM.  Patients are seen on a first-come first-served basis, and a limited number are seen during each clinic.   [facility]  [address] Hillard Danker [place], [place] [phone]   Eligibility Requirements You must have lived in [place], [place], or [place] counties for at least the last three months.   You cannot be eligible for state or federal sponsored [employer], including [employer], [place], or [employer].   You generally cannot be eligible for healthcare insurance through your employer.    How to apply: Eligibility screenings are held every Tuesday and Wednesday afternoon  from 1:00 pm until 4:00 pm. You do not need an appointment for the interview!  [facility] [address], [place], [place]   [place]  [place] Department  [place]  [phone]    Behavioral Health Resources in the Community: Intensive Outpatient Programs Organization         Address  Phone  Notes  [place], [place], [place] [phone]   [facility]patient [address], [place], [place]   ADS: Alcohol & Drug Svcs [address], [place], [place]   [place] [address],  [place], [place] or [phone]   Substance Abuse Resources Organization         Address  Phone  Notes  Alcohol and Drug Services  [phone]   [place]  [phone]   The [place]   Chinita Pester  [phone]   Residential & Outpatient Substance Abuse Program  [phone]   Psychological Services Organization         Address  Phone  Notes  [facility] [place]  [place]  [phone]   [place] [address], [place] or [phone]    Mobile Crisis Teams Organization         Address  Phone  Notes  [facility], Mobile Crisis Care Unit  [phone]   [facility] Psychotherapeutic [facility]  [address] [place], [place]   [place] [address], [place] [phone]    Self-Help/Support Groups Organization         Address  Phone             Notes  [place]. of [place] - variety of support groups  [place] Call for more information  Narcotics Anonymous (NA), [facility] [address], [employer] [place]  2 meetings at this location   Materials engineer         Address  Phone  Notes  [facility]idential [facility] [place],  [place]  [place]  [address] [place] [id number], [place], [place]   [place], [place] [phone] Admissions: 8am-3pm M-F  Incentives Substance [place] [address]    [place], [place] [phone]   [facility] [address] [place], [place], [place]   [facility] [address],  [place], [place]   [facility] Programs - Intensive Outpatient [address] [place]een [place] 1, [place], [place]   [facility] ([address] [place] [place].,  [place], [place] [phone] or [phone]   [facility] Services (RTS) [address], [place], [place] Accepts Medicaid  [facility] [place] [address],  [place] [phone] Substance Abuse/Addiction Treatment   [facility] Organization         Address  Phone  Notes  [facility]  [phone]   Domenic Schwab, PhD [address] [place]lis [place], [place]   [phone] or [phone]   [place] [address] [place], [place] [phone]   [facility] [address], [place], [place] [phone] Insurance/Medicaid/sponsorship through [facility] and Families [address], [address] [place]                                    [place], [place] [phone] [place] [address], [place] [phone]    Dr. Adele Schilder  [phone]   [facility] [place] Dept. 1) [address] [place] 2) [place] 3)  [place] [address], [place] [phone]  [phone]   [place] [phone] or [phone] (After Hours)

## 2013-07-24 NOTE — ED Notes (Signed)
Pt given a sandwich and a dinner tray

## 2013-07-24 NOTE — ED Notes (Signed)
Pt was going to jump off the parking deck downtown and called [employer] before he did so, he states that he's tired of living with disease and noone like him

## 2013-07-24 NOTE — ED Provider Notes (Signed)
CSN: [id number]     Arrival date & time 07/24/13  0123 History   First MD Initiated Contact with Patient 07/24/13 0405     Chief Complaint  Patient presents with  . Suicidal     (Consider location/radiation/quality/duration/timing/severity/associated sxs/prior Treatment) The history is provided by the patient.   John [name] is a 43 y.o. male presented to the ER, with suicidal ideation. He was evaluated at 04:15- and states that he is not suicidal at this time. He relates drinking alcohol heavily, "a binge", for several weeks, since being discharged, from a rehabilitation facility. He also is off his medicines, trazodone, and Seroquel, and he feels this makes him prone to suicidal ideation. He is currently living on the street, and does not have a home to go to. He does not want to talk to a therapist, and thinks she will feel better after resting and having breakfast. He denies recent illnesses. He did go to [facility] emergency department, yesterday, and was treated for muscle strain of the chest wall. There are no other known modifying factors.   Past Medical History  Diagnosis Date  . Asthma   . HIV (human immunodeficiency virus infection)     "dx'd ~ 2 yr ago" (09/29/2012)  . Mental disorder   . Anxiety   . Depression   . Chronic low back pain   . Alcohol abuse    Past Surgical History  Procedure Laterality Date  . Skin graft full thickness leg Left ?    POSTERIOR LEFT LEG  AFTER DOG BITES   History reviewed. No pertinent family history. History  Substance Use Topics  . Smoking status: Current Every Day Smoker -- 0.50 packs/day for 27 years    Types: Cigarettes  . Smokeless tobacco: Never Used  . Alcohol Use: No     Comment: In [facility] - off of alcohol since 05/25/13    Review of Systems  All other systems reviewed and are negative.     Allergies  Shellfish allergy  Home Medications   Prior to Admission medications   Medication Sig Start Date End Date Taking?  Authorizing Provider  albuterol (PROVENTIL HFA;VENTOLIN HFA) 108 (90 BASE) MCG/ACT inhaler Inhale 2 puffs into the lungs every 4 (four) hours as needed for wheezing or shortness of breath. 05/15/13   Encarnacion Slates, NP  beclomethasone (QVAR) 80 MCG/ACT inhaler Inhale 1 puff into the lungs 2 (two) times daily. [facility] sub is flovent 1 puff twice a day 05/15/13   Nicholaus Bloom, MD  ibuprofen (ADVIL,MOTRIN) 600 MG tablet Take 1 tablet (600 mg total) by mouth every 6 (six) hours as needed. 07/23/13   Montine Circle, PA-C  oxyCODONE-acetaminophen (PERCOCET/ROXICET) 5-325 MG per tablet Take 1-2 tablets by mouth every 6 (six) hours as needed for severe pain. 06/16/13   Blanchie Dessert, MD  predniSONE (DELTASONE) 10 MG tablet Take 4 tablets (40 mg total) by mouth daily. 06/14/13   Glendell Docker, NP  QUEtiapine (SEROQUEL) 100 MG tablet Take 100-150 mg by mouth 3 (three) times daily. Take 1 tablet (100 mg) twice daily & 1.5 tablet (150 mg) at bedtime for mood control. 05/15/13   Encarnacion Slates, NP  traZODone (DESYREL) 100 MG tablet Take 1 tablet (100 mg total) by mouth at bedtime and may repeat dose one time if needed. For sleep 05/15/13   Encarnacion Slates, NP   BP 122/86  Pulse 119  Temp(Src) 98.7 F (37.1 C) (Oral)  Resp 18  SpO2 97%  Physical Exam  Nursing note and vitals reviewed. Constitutional: He is oriented to person, place, and time. He appears well-developed and well-nourished.  HENT:  Head: Normocephalic and atraumatic.  Right Ear: External ear normal.  Left Ear: External ear normal.  Eyes: Conjunctivae and EOM are normal. Pupils are equal, round, and reactive to light.  Neck: Normal range of motion and phonation normal. Neck supple.  Cardiovascular: Normal rate, regular rhythm, normal heart sounds and intact distal pulses.   Pulmonary/Chest: Effort normal and breath sounds normal. He exhibits no bony tenderness.  Abdominal: Soft. There is no tenderness.  Musculoskeletal: Normal range of  motion.  Neurological: He is alert and oriented to person, place, and time. No cranial nerve deficit or sensory deficit. He exhibits normal muscle tone. Coordination normal.  Skin: Skin is warm, dry and intact.  Psychiatric: His behavior is normal. Judgment and thought content normal.  He appears depressed. He was tearful when talking about his living situation.    ED Course  Procedures (including critical care time) Medications - No data to display  Patient Vitals for the past 24 hrs:  BP Temp Temp src Pulse Resp SpO2  07/24/13 0132 122/86 mmHg 98.7 F (37.1 C) Oral 119 18 97 %    8:25 AM Reevaluation with update and discussion. After initial assessment and treatment, an updated evaluation reveals he remains comfortable, he is awaiting breakfast. Richarda Blade    Labs Review Labs Reviewed  COMPREHENSIVE METABOLIC PANEL - Abnormal; Notable for the following:    Glucose, Bld 121 (*)    GFR calc non Af Amer 88 (*)    All other components within normal limits  ETHANOL - Abnormal; Notable for the following:    Alcohol, Ethyl (B) 212 (*)    All other components within normal limits  URINE RAPID DRUG SCREEN (HOSP PERFORMED) - Abnormal; Notable for the following:    Tetrahydrocannabinol POSITIVE (*)    All other components within normal limits  CBC    Imaging Review Dg Chest 2 View  07/24/2013   CLINICAL DATA:  Right upper quadrant pain and shortness of breath for 3 days.  EXAM: CHEST  2 VIEW  COMPARISON:  DG RIBS UNILATERAL W/CHEST*L* dated 06/16/2013  FINDINGS: The heart size and mediastinal contours are within normal limits. Both lungs are clear. The visualized skeletal structures are unremarkable.  IMPRESSION: No active cardiopulmonary disease.   Electronically Signed   By: Lucienne Capers M.D.   On: 07/24/2013 02:35     EKG Interpretation None      MDM   Final diagnoses:  Alcohol intoxication    Alcohol intoxication, with transient suicidal  Ideation.   Nursing  Notes Reviewed/ Care Coordinated, and agree without changes. Applicable Imaging Reviewed.  Interpretation of Laboratory Data incorporated into ED treatment  Plan: Care to Dr. Maryan Rued, to evaluate after Breakfast- 08:30   Richarda Blade, MD 07/24/13 [phone]

## 2013-07-24 NOTE — Progress Notes (Signed)
[facility] CL provided pt with list of primary care resources, highlighting both [facility] and [facility], to help patient establish primary care.

## 2013-08-17 ENCOUNTER — Ambulatory Visit (INDEPENDENT_AMBULATORY_CARE_PROVIDER_SITE_OTHER): Payer: Self-pay | Admitting: *Deleted

## 2013-08-17 VITALS — BP 120/80 | HR 91 | Temp 98.1°F | Resp 18 | Wt 196.8 lb

## 2013-08-17 DIAGNOSIS — B20 Human immunodeficiency virus [HIV] disease: Secondary | ICD-10-CM

## 2013-08-17 DIAGNOSIS — Z21 Asymptomatic human immunodeficiency virus [HIV] infection status: Secondary | ICD-10-CM

## 2013-08-17 LAB — COMPREHENSIVE METABOLIC PANEL
ALT: 19 U/L (ref 0–53)
AST: 28 U/L (ref 0–37)
Albumin: 4.6 g/dL (ref 3.5–5.2)
Alkaline Phosphatase: 86 U/L (ref 39–117)
BILIRUBIN TOTAL: 0.5 mg/dL (ref 0.2–1.2)
BUN: 9 mg/dL (ref 6–23)
CO2: 26 meq/L (ref 19–32)
CREATININE: 0.99 mg/dL (ref 0.50–1.35)
Calcium: 9.5 mg/dL (ref 8.4–10.5)
Chloride: 104 mEq/L (ref 96–112)
Glucose, Bld: 99 mg/dL (ref 70–99)
Potassium: 3.7 mEq/L (ref 3.5–5.3)
Sodium: 141 mEq/L (ref 135–145)
Total Protein: 7.9 g/dL (ref 6.0–8.3)

## 2013-08-17 LAB — POCT URINALYSIS DIPSTICK
Bilirubin, UA: NEGATIVE
Glucose, UA: NEGATIVE
Ketones, UA: 15
Nitrite, UA: NEGATIVE
PH UA: 5.5
PROTEIN UA: 30
RBC UA: NEGATIVE
UROBILINOGEN UA: 0.2

## 2013-08-17 LAB — LIPID PANEL
CHOL/HDL RATIO: 2.3 ratio
CHOLESTEROL: 146 mg/dL (ref 0–200)
HDL: 64 mg/dL (ref >39)
LDL Cholesterol: 63 mg/dL (ref 0–99)
Triglycerides: 96 mg/dL (ref <150)
VLDL: 19 mg/dL (ref 0–40)

## 2013-08-17 NOTE — Progress Notes (Signed)
John [name] is here for START study, month 36. We reviewed the findings of the study and the need for him to start ARVs. He verbalized understanding and I provided him a copy of the letter. He has an appointment with Dr. Megan Salon on 6/23 to get prescription to start ARVs. ARVs are provided by study except for tivicay and triumeq. He will also need to see Sharyn Lull to apply for ADAP/RW. He has been told to bring his ID.  He did fracture a rib since the last time we saw him and he states this resulted from his asthma. Questionnaires completed and fasting labs drawn. ECG obtained. He received $20 gift card and 2 bus passes for study visit. Next appointment scheduled in October. Araceli Bouche RN

## 2013-08-20 LAB — HIV-1 RNA QUANT-NO REFLEX-BLD
HIV 1 RNA Quant: 2391 copies/mL — ABNORMAL HIGH (ref <20)
HIV-1 RNA QUANT, LOG: 3.38 {Log} — AB (ref <1.30)

## 2013-08-31 ENCOUNTER — Encounter: Payer: Self-pay | Admitting: Infectious Disease

## 2013-08-31 LAB — CD4/CD8 (T-HELPER/T-SUPPRESSOR CELL)
CD4%: 24.7
CD4: 395
CD8 T CELL SUPPRESSOR: 67.8
CD8: 1085

## 2013-09-04 ENCOUNTER — Ambulatory Visit: Payer: Self-pay | Admitting: Internal Medicine

## 2013-09-19 ENCOUNTER — Emergency Department (HOSPITAL_COMMUNITY): Payer: Self-pay

## 2013-09-19 ENCOUNTER — Emergency Department (HOSPITAL_COMMUNITY)
Admission: EM | Admit: 2013-09-19 | Discharge: 2013-09-19 | Disposition: A | Discharge status: Home / Self Care | Payer: Self-pay | Attending: Emergency Medicine | Admitting: Emergency Medicine

## 2013-09-19 ENCOUNTER — Encounter (HOSPITAL_COMMUNITY): Payer: Self-pay | Admitting: Emergency Medicine

## 2013-09-19 ENCOUNTER — Emergency Department (HOSPITAL_COMMUNITY)
Admission: EM | Admit: 2013-09-19 | Discharge: 2013-09-20 | Disposition: A | Discharge status: Other Acute Inpatient Hospital | Payer: Self-pay | Attending: Emergency Medicine | Admitting: Emergency Medicine

## 2013-09-19 DIAGNOSIS — F322 Major depressive disorder, single episode, severe without psychotic features: Secondary | ICD-10-CM | POA: Insufficient documentation

## 2013-09-19 DIAGNOSIS — F329 Major depressive disorder, single episode, unspecified: Secondary | ICD-10-CM | POA: Diagnosis present

## 2013-09-19 DIAGNOSIS — J45909 Unspecified asthma, uncomplicated: Secondary | ICD-10-CM | POA: Insufficient documentation

## 2013-09-19 DIAGNOSIS — G8929 Other chronic pain: Secondary | ICD-10-CM | POA: Insufficient documentation

## 2013-09-19 DIAGNOSIS — Z21 Asymptomatic human immunodeficiency virus [HIV] infection status: Secondary | ICD-10-CM | POA: Insufficient documentation

## 2013-09-19 DIAGNOSIS — F17200 Nicotine dependence, unspecified, uncomplicated: Secondary | ICD-10-CM | POA: Insufficient documentation

## 2013-09-19 DIAGNOSIS — M109 Gout, unspecified: Secondary | ICD-10-CM | POA: Insufficient documentation

## 2013-09-19 DIAGNOSIS — F411 Generalized anxiety disorder: Secondary | ICD-10-CM | POA: Insufficient documentation

## 2013-09-19 DIAGNOSIS — Z79899 Other long term (current) drug therapy: Secondary | ICD-10-CM | POA: Insufficient documentation

## 2013-09-19 DIAGNOSIS — F3289 Other specified depressive episodes: Secondary | ICD-10-CM | POA: Insufficient documentation

## 2013-09-19 LAB — CBC WITH DIFFERENTIAL/PLATELET
Basophils Absolute: 0 10*3/uL (ref 0.0–0.1)
Basophils Relative: 0 % (ref 0–1)
EOS PCT: 3 % (ref 0–5)
Eosinophils Absolute: 0.2 10*3/uL (ref 0.0–0.7)
HEMATOCRIT: 43.2 % (ref 39.0–52.0)
HEMOGLOBIN: 14.8 g/dL (ref 13.0–17.0)
LYMPHS ABS: 2.9 10*3/uL (ref 0.7–4.0)
Lymphocytes Relative: 43 % (ref 12–46)
MCH: 31 pg (ref 26.0–34.0)
MCHC: 34.3 g/dL (ref 30.0–36.0)
MCV: 90.6 fL (ref 78.0–100.0)
MONO ABS: 0.6 10*3/uL (ref 0.1–1.0)
Monocytes Relative: 9 % (ref 3–12)
Neutro Abs: 3.1 10*3/uL (ref 1.7–7.7)
Neutrophils Relative %: 45 % (ref 43–77)
Platelets: 330 10*3/uL (ref 150–400)
RBC: 4.77 MIL/uL (ref 4.22–5.81)
RDW: 13.9 % (ref 11.5–15.5)
WBC: 6.8 10*3/uL (ref 4.0–10.5)

## 2013-09-19 LAB — BASIC METABOLIC PANEL
Anion gap: 17 — ABNORMAL HIGH (ref 5–15)
BUN: 8 mg/dL (ref 6–23)
CHLORIDE: 96 meq/L (ref 96–112)
CO2: 22 mEq/L (ref 19–32)
Calcium: 9.4 mg/dL (ref 8.4–10.5)
Creatinine, Ser: 0.92 mg/dL (ref 0.50–1.35)
GFR calc Af Amer: >90 mL/min (ref >90)
GFR calc non Af Amer: >90 mL/min (ref >90)
Glucose, Bld: 138 mg/dL — ABNORMAL HIGH (ref 70–99)
POTASSIUM: 3.6 meq/L — AB (ref 3.7–5.3)
Sodium: 135 mEq/L — ABNORMAL LOW (ref 137–147)

## 2013-09-19 LAB — RAPID URINE DRUG SCREEN, HOSP PERFORMED
AMPHETAMINES: NOT DETECTED
Barbiturates: NOT DETECTED
Benzodiazepines: NOT DETECTED
COCAINE: POSITIVE — AB
OPIATES: NOT DETECTED
TETRAHYDROCANNABINOL: POSITIVE — AB

## 2013-09-19 LAB — SALICYLATE LEVEL: Salicylate Lvl: <2 mg/dL — ABNORMAL LOW (ref 2.8–20.0)

## 2013-09-19 LAB — ACETAMINOPHEN LEVEL: Acetaminophen (Tylenol), Serum: <15 ug/mL (ref 10–30)

## 2013-09-19 MED ORDER — ACETAMINOPHEN 500 MG PO TABS
1000.0000 mg | ORAL_TABLET | Freq: Four times a day (QID) | ORAL | Status: DC | PRN
  Administered 2013-09-19: 1000 mg via ORAL
  Filled 2013-09-19: qty 2

## 2013-09-19 MED ORDER — INDOMETHACIN 25 MG PO CAPS: 25.0000 mg | ORAL_CAPSULE | Freq: Two times a day (BID) | ORAL | Status: DC

## 2013-09-19 MED ORDER — HYDROCODONE-ACETAMINOPHEN 5-325 MG PO TABS: 2.0000 | ORAL_TABLET | Freq: Four times a day (QID) | ORAL | Status: DC | PRN

## 2013-09-19 MED ORDER — ALBUTEROL SULFATE HFA 108 (90 BASE) MCG/ACT IN AERS
2.0000 | INHALATION_SPRAY | RESPIRATORY_TRACT | Status: DC | PRN
  Administered 2013-09-19 – 2013-09-20 (×2): 2 via RESPIRATORY_TRACT
  Filled 2013-09-19: qty 6.7

## 2013-09-19 NOTE — ED Notes (Signed)
Pt L foot pain/swelling x 1 day.  Pain score 10/10.  Denies injury.  Pt reports that he was walking to the store when the pain started and when he got home, he noticed the swelling.  Swelling noted to L foot.

## 2013-09-19 NOTE — ED Notes (Signed)
Per [facility]. Pt complains of L foot swelling since yesterday. Foot warm, pink and has pulse.

## 2013-09-19 NOTE — ED Notes (Signed)
Pt states he has been off his Seroquel, Trazodone, and other meds for the last 5 weeks and now he is feeling suicidal. Pt states he wants to "jump off a bridge."

## 2013-09-19 NOTE — ED Notes (Signed)
Bed: [facility] Expected date:  Expected time:  Means of arrival:  Comments: ems 

## 2013-09-19 NOTE — ED Provider Notes (Signed)
Medical screening examination/treatment/procedure(s) were performed by non-physician practitioner and as supervising physician I was immediately available for consultation/collaboration.   EKG Interpretation None        Osvaldo Shipper, MD 09/19/13 2316

## 2013-09-19 NOTE — Progress Notes (Signed)
Pt. Here from ED after c/o SI. Pt. Initially in ED for foot pain related to gout. Pt. Alert and oriented in no acute distress, ambulatory without assistance. Pt. C/o 10 bilateral foot pain. Pt. States he has SI to jump off bridge but denies HI, AVH. Pt. Requesting Albuterol inhaler at this time.

## 2013-09-19 NOTE — ED Notes (Signed)
PA-C at bedside 

## 2013-09-19 NOTE — ED Provider Notes (Signed)
CSN: [id number]     Arrival date & time 09/19/13  1557 History   First MD Initiated Contact with Patient 09/19/13 1736     Chief Complaint  Patient presents with  . Foot Swelling     (Consider location/radiation/quality/duration/timing/severity/associated sxs/prior Treatment) HPI Comments: Patient presents to the emergency department with chief complaint of left foot pain. He states pain started yesterday. It is progressively worsened. States that it started in his left great toe at the [facility]. He states that it is exquisitely tender to palpation. It is worsened with movement. He denies any known mechanism of injury. He has not tried taking anything to alleviate his symptoms. He is HIV positive, last CD4 count and viral load are unknown. Patient has followup appointment with infectious disease later this month. He denies any fevers or chills.  The history is provided by the patient. No language interpreter was used.    Past Medical History  Diagnosis Date  . Asthma   . HIV (human immunodeficiency virus infection)     "dx'd ~ 2 yr ago" (09/29/2012)  . Mental disorder   . Anxiety   . Depression   . Chronic low back pain   . Alcohol abuse    Past Surgical History  Procedure Laterality Date  . Skin graft full thickness leg Left ?    POSTERIOR LEFT LEG  AFTER DOG BITES   History reviewed. No pertinent family history. History  Substance Use Topics  . Smoking status: Current Every Day Smoker -- 0.50 packs/day for 27 years    Types: Cigarettes  . Smokeless tobacco: Never Used  . Alcohol Use: 0.0 oz/week    Review of Systems  All other systems reviewed and are negative.     Allergies  Shellfish allergy  Home Medications   Prior to Admission medications   Medication Sig Start Date End Date Taking? Authorizing Provider  albuterol (PROVENTIL HFA;VENTOLIN HFA) 108 (90 BASE) MCG/ACT inhaler Inhale 2 puffs into the lungs every 4 (four) hours as needed for wheezing or shortness of  breath.   Yes Historical Provider, MD  QUEtiapine (SEROQUEL) 100 MG tablet Take 1 twice a day, and 1.5 at bedtime 07/24/13  Yes Richarda Blade, MD  traZODone (DESYREL) 100 MG tablet Take 1 tablet (100 mg total) by mouth at bedtime. 07/24/13  Yes Richarda Blade, MD   BP 113/80  Pulse 85  Temp(Src) 98.7 F (37.1 C) (Oral)  Resp 16  SpO2 95% Physical Exam  Nursing note and vitals reviewed. Constitutional: He is oriented to person, place, and time. He appears well-developed and well-nourished.  HENT:  Head: Normocephalic and atraumatic.  Eyes: Conjunctivae and EOM are normal. Pupils are equal, round, and reactive to light. Right eye exhibits no discharge. Left eye exhibits no discharge. No scleral icterus.  Neck: Normal range of motion. Neck supple. No JVD present.  Cardiovascular: Normal rate, regular rhythm, normal heart sounds and intact distal pulses.  Exam reveals no gallop and no friction rub.   No murmur heard. Intact distal pulses with brisk capillary refill  Pulmonary/Chest: Effort normal and breath sounds normal. No respiratory distress. He has no wheezes. He has no rales. He exhibits no tenderness.  Abdominal: Soft. He exhibits no distension and no mass. There is no tenderness. There is no rebound and no guarding.  Musculoskeletal: Normal range of motion. He exhibits no edema and no tenderness.  Left foot remarkable for tenderness to palpation about the great toe, mildly erythematous, range of motion and  strength limited secondary to pain, no bony abnormality or deformity  Neurological: He is alert and oriented to person, place, and time.  Sensation intact  Skin: Skin is warm and dry.  No lesions, evidence of cellulitis, or abscess  Psychiatric: He has a normal mood and affect. His behavior is normal. Judgment and thought content normal.    ED Course  Procedures (including critical care time) Labs Review Labs Reviewed - No data to display  Imaging Review Dg Foot Complete  Left  09/19/2013   CLINICAL DATA:  Left foot pain and swelling.  EXAM: LEFT FOOT - COMPLETE 3+ VIEW  COMPARISON:  Left ankle radiograph 01/24/2013.  FINDINGS: There was a question of an avulsion from the base of the fifth metatarsal on the previous radiograph. This is faintly seen today as a chronic deformity. There is no acute fracture. Remainder of the bones of the foot appear unremarkable. No soft tissue swelling.  IMPRESSION: No No acute abnormality is evident.   Electronically Signed   By: Rolla Flatten M.D.   On: 09/19/2013 16:46     EKG Interpretation None      MDM   Final diagnoses:  Gout of big toe    Pt presents with monoarticular pain, swelling and erythema.  No open wounds.  No sign of cellulitis.  Pt is afebrile and stable. Imaging reviewed, no evidence of occult fracture or injury. Pt dc with indomethacin (50 mg PO BID). Discussed that pt should respond to treatment with in 24 hour of begining treatment & likely resolve in 2-3 days.      Montine Circle, PA-C 09/19/13 [phone]

## 2013-09-19 NOTE — ED Provider Notes (Signed)
7:30 PM As the patient was walking out, he said that he does not feel suicidal.  Will consult TTS.  Feels like he has been suicidal for the past 4-5 weeks.  Thought about jumping off a bridge.  States that recently has had 3 loved ones die.  No HI currently, but has in the past.  Montine Circle, PA-C 09/20/13 1501

## 2013-09-19 NOTE — Discharge Instructions (Signed)

## 2013-09-19 NOTE — ED Notes (Addendum)
Pt. Changed in new blue scrubs. Pt. And belongings searched and wanded by security. Pt. Has 2 belongings bags. Pt. Has red shorts, white shoes, beige t-shirt socks and black bag and crutches. Pt. Belongings locked up in TCU near the psych-ed locker # 36.

## 2013-09-20 ENCOUNTER — Encounter (HOSPITAL_COMMUNITY): Payer: Self-pay | Admitting: *Deleted

## 2013-09-20 ENCOUNTER — Inpatient Hospital Stay (HOSPITAL_COMMUNITY)
Admission: EM | Admit: 2013-09-20 | Discharge: 2013-09-28 | DRG: 885 | Disposition: A | Discharge status: Home / Self Care | Payer: No Typology Code available for payment source | Source: Intra-hospital | Attending: Psychiatry | Admitting: Psychiatry

## 2013-09-20 DIAGNOSIS — R45851 Suicidal ideations: Secondary | ICD-10-CM

## 2013-09-20 DIAGNOSIS — Z598 Other problems related to housing and economic circumstances: Secondary | ICD-10-CM

## 2013-09-20 DIAGNOSIS — F1010 Alcohol abuse, uncomplicated: Secondary | ICD-10-CM | POA: Diagnosis present

## 2013-09-20 DIAGNOSIS — Z5900 Homelessness unspecified: Secondary | ICD-10-CM

## 2013-09-20 DIAGNOSIS — G8929 Other chronic pain: Secondary | ICD-10-CM | POA: Diagnosis present

## 2013-09-20 DIAGNOSIS — F1410 Cocaine abuse, uncomplicated: Secondary | ICD-10-CM | POA: Diagnosis present

## 2013-09-20 DIAGNOSIS — F17200 Nicotine dependence, unspecified, uncomplicated: Secondary | ICD-10-CM | POA: Diagnosis present

## 2013-09-20 DIAGNOSIS — R45850 Homicidal ideations: Secondary | ICD-10-CM

## 2013-09-20 DIAGNOSIS — Z91013 Allergy to seafood: Secondary | ICD-10-CM

## 2013-09-20 DIAGNOSIS — F329 Major depressive disorder, single episode, unspecified: Secondary | ICD-10-CM | POA: Diagnosis present

## 2013-09-20 DIAGNOSIS — G4700 Insomnia, unspecified: Secondary | ICD-10-CM | POA: Diagnosis present

## 2013-09-20 DIAGNOSIS — J45909 Unspecified asthma, uncomplicated: Secondary | ICD-10-CM | POA: Diagnosis present

## 2013-09-20 DIAGNOSIS — Z21 Asymptomatic human immunodeficiency virus [HIV] infection status: Secondary | ICD-10-CM | POA: Diagnosis present

## 2013-09-20 DIAGNOSIS — Z5987 Material hardship due to limited financial resources, not elsewhere classified: Secondary | ICD-10-CM

## 2013-09-20 DIAGNOSIS — M545 Low back pain, unspecified: Secondary | ICD-10-CM | POA: Diagnosis present

## 2013-09-20 DIAGNOSIS — F1210 Cannabis abuse, uncomplicated: Secondary | ICD-10-CM | POA: Diagnosis present

## 2013-09-20 DIAGNOSIS — F332 Major depressive disorder, recurrent severe without psychotic features: Secondary | ICD-10-CM | POA: Diagnosis present

## 2013-09-20 DIAGNOSIS — F411 Generalized anxiety disorder: Secondary | ICD-10-CM | POA: Diagnosis present

## 2013-09-20 DIAGNOSIS — F333 Major depressive disorder, recurrent, severe with psychotic symptoms: Principal | ICD-10-CM | POA: Diagnosis present

## 2013-09-20 MED ORDER — ALBUTEROL SULFATE HFA 108 (90 BASE) MCG/ACT IN AERS: 2.0000 | INHALATION_SPRAY | RESPIRATORY_TRACT | Status: DC | PRN

## 2013-09-20 MED ORDER — ACETAMINOPHEN 325 MG PO TABS
650.0000 mg | ORAL_TABLET | Freq: Four times a day (QID) | ORAL | Status: DC | PRN
  Administered 2013-09-21 – 2013-09-27 (×2): 650 mg via ORAL
  Filled 2013-09-20 (×2): qty 2

## 2013-09-20 MED ORDER — ALBUTEROL SULFATE HFA 108 (90 BASE) MCG/ACT IN AERS
2.0000 | INHALATION_SPRAY | RESPIRATORY_TRACT | Status: DC | PRN
  Administered 2013-09-20 – 2013-09-28 (×6): 2 via RESPIRATORY_TRACT
  Filled 2013-09-20: qty 6.7

## 2013-09-20 MED ORDER — QUETIAPINE FUMARATE 300 MG PO TABS
300.0000 mg | ORAL_TABLET | Freq: Every day | ORAL | Status: DC
  Administered 2013-09-20 – 2013-09-25 (×6): 300 mg via ORAL
  Filled 2013-09-20 (×11): qty 1

## 2013-09-20 MED ORDER — ALUM & MAG HYDROXIDE-SIMETH 200-200-20 MG/5ML PO SUSP: 30.0000 mL | ORAL | Status: DC | PRN

## 2013-09-20 MED ORDER — MAGNESIUM HYDROXIDE 400 MG/5ML PO SUSP: 30.0000 mL | Freq: Every day | ORAL | Status: DC | PRN

## 2013-09-20 MED ORDER — QUETIAPINE FUMARATE 300 MG PO TABS: 300.0000 mg | ORAL_TABLET | Freq: Every day | ORAL | Status: DC

## 2013-09-20 MED ORDER — RISPERIDONE 2 MG PO TBDP
2.0000 mg | ORAL_TABLET | Freq: Once | ORAL | Status: AC
  Administered 2013-09-20: 2 mg via ORAL
  Filled 2013-09-20 (×2): qty 1

## 2013-09-20 MED ORDER — INDOMETHACIN 25 MG PO CAPS
25.0000 mg | ORAL_CAPSULE | Freq: Two times a day (BID) | ORAL | Status: DC
  Administered 2013-09-20 – 2013-09-28 (×16): 25 mg via ORAL
  Filled 2013-09-20 (×23): qty 1

## 2013-09-20 MED ORDER — TRAZODONE HCL 100 MG PO TABS: 100.0000 mg | ORAL_TABLET | Freq: Every day | ORAL | Status: DC

## 2013-09-20 MED ORDER — INDOMETHACIN 25 MG PO CAPS
25.0000 mg | ORAL_CAPSULE | Freq: Two times a day (BID) | ORAL | Status: DC
  Filled 2013-09-20 (×2): qty 1

## 2013-09-20 MED ORDER — TRAZODONE HCL 100 MG PO TABS
100.0000 mg | ORAL_TABLET | Freq: Every day | ORAL | Status: DC
  Administered 2013-09-20 – 2013-09-23 (×4): 100 mg via ORAL
  Filled 2013-09-20 (×8): qty 1
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
## 2013-09-20 NOTE — BH Assessment (Signed)
Patient accepted to the [facility] observation unit (bed # 1) by Dr. Lovena Le.

## 2013-09-20 NOTE — Progress Notes (Signed)
[facility] INPATIENT:  Family/Significant Other Suicide Prevention Education  Suicide Prevention Education:  Patient Refusal for Family/Significant Other Suicide Prevention Education: The patient John [name] has refused to provide written consent for family/significant other to be provided Family/Significant Other Suicide Prevention Education during admission and/or prior to discharge.  Physician notified.  Debbrah Alar 09/20/2013, 5:15 PM

## 2013-09-20 NOTE — Plan of Care (Signed)
[facility] Observation Crisis Plan  Reason for Crisis Plan:  Crisis Stabilization and Substance Abuse   Plan of Care:  Referral for Inpatient Hospitalization and Referral for Substance Abuse  Family Support:      Current Living Environment:  Living Arrangements: Other (Comment) (homeless)  Insurance:   Hospital Account   Name Acct ID Class Status Primary Coverage   Abdulrahman, Bracey [id number] [place] Open None        Guarantor Account (for Hospital Account [id number])   Name Relation to Pt Service Area Active? Acct Type   [name] Self [facility] Yes Behavioral Health   Address Phone       [address] [place], [place] [address] [phone])          Coverage Information (for Hospital Account [id number])   Not on file      Legal Guardian:     Primary Care Provider:  Pcp Not In System  Current Outpatient Providers:  [facility]  Psychiatrist:     Counselor/Therapist:     Compliant with Medications:  No  Additional Information:   Debbrah Alar 7/9/20155:13 PM

## 2013-09-20 NOTE — [facility] Suicide Risk Assessment (Cosign Needed Addendum)
Suicide Risk Assessment  Admission Assessment     Nursing information obtained from:   Patient Demographic factors:   43 year old African American Current Mental Status:   Alert and oriented x 3 Loss Factors:   recent loss of grandmother and 2 brother (all within the same week) Historical Factors:   history of MDD and SI Total Time spent with patient: 15 minutes  CLINICAL FACTORS:   Severe Anxiety and/or Agitation Depression:   Hopelessness  Patient leans: N/A; does ambulate with crutches to assist with ambulation PRN as he has gouty arthritis with current flare.    COGNITIVE FEATURES THAT CONTRIBUTE TO RISK:  None- open minded  SUICIDE RISK:   Moderate:  Frequent suicidal ideation with limited intensity, and duration, some specificity in terms of plans, no associated intent, good self-control, limited dysphoria/symptomatology, some risk factors present, and identifiable protective factors, including available and accessible social support.  PLAN OF CARE:  I certify that inpatient services furnished can reasonably be expected to improve the patient's condition.   Gypsy Lore CORI 09/20/2013, 5:15 PM

## 2013-09-20 NOTE — Progress Notes (Signed)
Pt complaint of A/hall at present "to kill myself'. Unable to contract for safety. Pt anxious and trashing around in bed. Serena Colonel NP notified.

## 2013-09-20 NOTE — Consult Note (Signed)
Face to face evaluation and I agree with this note 

## 2013-09-20 NOTE — Progress Notes (Signed)
[facility] CL tried to speak with patient about [employer]. Patient pcp on card is [email or web address]. Patient did not acknowledge that I was in room. Provided pt with a list of primary care resources, highlighting [facility].
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
## 2013-09-20 NOTE — H&P (Signed)
Psychiatric Admission Assessment Adult  Patient Identification:  John [name] Date of Evaluation:  09/20/2013 Chief Complaint:  MDD RECURRENT SEVERE WITH PSYCHOTIC FEATURES  History of Present Illness:: 25 yaer old male who presented to [facility] for issues with gout.  He was treated and was given crutches to assist with ambulation.  Upon discharge patient proceeded to inform staff that he was suicidal with a plan to jump off of a bridge.  Per counselors assessment patient has been experiencing SI x 5 weeks.  He informs this Probation officer that the death of 3 family members (2 brothers and his grandmother) in the same week.  He continues, "If I had been taking my medication I would probably ne able to deal with things".  He expresses concern re: scheduled medication to include his inhaler.  He reports SI presently with a plan to jump off of a bridge.  Denies SI or AVH.        Associated Signs/Synptoms: Depression Symptoms:  depressed mood, Anxiety Symptoms:  Excessive Worry, PTSD Symptoms: NA Total Time spent with patient: 15 minutes  Psychiatric Specialty Exam: Physical Exam  ROS  There were no vitals taken for this visit.There is no weight on file to calculate BMI.  General Appearance: Disheveled  Eye Sport and exercise psychologist::  Fair  Speech:  Clear and Coherent  Volume:  Normal  Mood:  Anxious and Depressed  Affect:  Depressed  Thought Process:  Circumstantial  Orientation:  Full (Time, Place, and Person)  Thought Content:  WDL  Suicidal Thoughts:  Yes.  with intent/plan  Homicidal Thoughts:  No  Memory:  Immediate;   Good Recent;   Good  Judgement:  Impaired  Insight:  Lacking  Psychomotor Activity:  Tremor  Concentration:  Fair  Recall:  [employer] of Knowledge:Poor  Language: Fair  Akathisia:  Negative  Handed:    AIMS (if indicated):     Assets:  Communication Skills  Sleep:       Past Psychiatric History: Diagnosis: MDD, recurrent severe  Hospitalizations: multiple; frequent  Outpatient  Care: [facility]   Substance Abuse Care:  Self-Mutilation: No        Past Medical History:   Past Medical History  Diagnosis Date  . Asthma   . HIV (human immunodeficiency virus infection)     "dx'd ~ 2 yr ago" (09/29/2012)  . Mental disorder   . Anxiety   . Depression   . Chronic low back pain   . Alcohol abuse    None. Allergies:   Allergies  Allergen Reactions  . Shellfish Allergy Anaphylaxis and Swelling   PTA Medications: Prescriptions prior to admission  Medication Sig Dispense Refill  . albuterol (PROVENTIL HFA;VENTOLIN HFA) 108 (90 BASE) MCG/ACT inhaler Inhale 2 puffs into the lungs every 4 (four) hours as needed for wheezing or shortness of breath.      Marland Kitchen HYDROcodone-acetaminophen (NORCO/VICODIN) 5-325 MG per tablet Take 2 tablets by mouth every 6 (six) hours as needed for moderate pain or severe pain.  6 tablet  0  . indomethacin (INDOCIN) 25 MG capsule Take 1 capsule (25 mg total) by mouth 2 (two) times daily with a meal.  30 capsule  0  . QUEtiapine (SEROQUEL) 100 MG tablet Take 1 twice a day, and 1.5 at bedtime  105 tablet  0  . traZODone (DESYREL) 100 MG tablet Take 1 tablet (100 mg total) by mouth at bedtime.  30 tablet  0    Previous Psychotropic Medications:  Medication/Dose  Substance Abuse History in the last 12 months:  Yes.    Consequences of Substance Abuse: Medical Consequences:  gouty arthritis  Social History:  reports that he has been smoking Cigarettes.  He has a 13.5 pack-year smoking history. He has never used smokeless tobacco. He reports that he drinks alcohol. He reports that he uses illicit drugs (Marijuana and Cocaine) about 7 times per week. Additional Social History:                      Current Place of Residence:   Place of Birth:   Family Members: Marital Status:  Single Children:  Sons:  Daughters: Relationships: Education:  9th grade  Educational Problems/Performance: Religious  Beliefs/Practices: History of Abuse (Emotional/Phsycial/Sexual) Ship broker History:  None. Legal History: Hobbies/Interests:  Family History:  No family history on file.  Results for orders placed during the hospital encounter of 09/19/13 (from the past 72 hour(s))  CBC WITH DIFFERENTIAL     Status: None   Collection Time    09/19/13  7:57 PM      Result Value Ref Range   WBC 6.8  4.0 - 10.5 K/uL   RBC 4.77  4.22 - 5.81 MIL/uL   Hemoglobin 14.8  13.0 - 17.0 g/dL   HCT 43.2  39.0 - 52.0 %   MCV 90.6  78.0 - 100.0 fL   MCH 31.0  26.0 - 34.0 pg   MCHC 34.3  30.0 - 36.0 g/dL   RDW 13.9  11.5 - 15.5 %   Platelets 330  150 - 400 K/uL   Neutrophils Relative % 45  43 - 77 %   Neutro Abs 3.1  1.7 - 7.7 K/uL   Lymphocytes Relative 43  12 - 46 %   Lymphs Abs 2.9  0.7 - 4.0 K/uL   Monocytes Relative 9  3 - 12 %   Monocytes Absolute 0.6  0.1 - 1.0 K/uL   Eosinophils Relative 3  0 - 5 %   Eosinophils Absolute 0.2  0.0 - 0.7 K/uL   Basophils Relative 0  0 - 1 %   Basophils Absolute 0.0  0.0 - 0.1 K/uL  BASIC METABOLIC PANEL     Status: Abnormal   Collection Time    09/19/13  7:57 PM      Result Value Ref Range   Sodium 135 (*) 137 - 147 mEq/L   Potassium 3.6 (*) 3.7 - 5.3 mEq/L   Chloride 96  96 - 112 mEq/L   CO2 22  19 - 32 mEq/L   Glucose, Bld 138 (*) 70 - 99 mg/dL   BUN 8  6 - 23 mg/dL   Creatinine, Ser 0.92  0.50 - 1.35 mg/dL   Calcium 9.4  8.4 - 10.5 mg/dL   GFR calc non Af Amer >90  >90 mL/min   GFR calc Af Amer >90  >90 mL/min   Comment: (NOTE)     The eGFR has been calculated using the CKD EPI equation.     This calculation has not been validated in all clinical situations.     eGFR's persistently <90 mL/min signify possible Chronic Kidney     Disease.   Anion gap 17 (*) 5 - 15  ACETAMINOPHEN LEVEL     Status: None   Collection Time    09/19/13  7:57 PM      Result Value Ref Range   Acetaminophen (Tylenol), Serum <15.0  10 - 30 ug/mL  Comment:            THERAPEUTIC CONCENTRATIONS VARY     SIGNIFICANTLY. A RANGE OF 10-30     ug/mL MAY BE AN EFFECTIVE     CONCENTRATION FOR MANY PATIENTS.     HOWEVER, SOME ARE BEST TREATED     AT CONCENTRATIONS OUTSIDE THIS     RANGE.     ACETAMINOPHEN CONCENTRATIONS     >150 ug/mL AT 4 HOURS AFTER     INGESTION AND >50 ug/mL AT 12     HOURS AFTER INGESTION ARE     OFTEN ASSOCIATED WITH TOXIC     REACTIONS.  SALICYLATE LEVEL     Status: Abnormal   Collection Time    09/19/13  7:57 PM      Result Value Ref Range   Salicylate Lvl <1.7 (*) 2.8 - 20.0 mg/dL  URINE RAPID DRUG SCREEN (HOSP PERFORMED)     Status: Abnormal   Collection Time    09/19/13  8:09 PM      Result Value Ref Range   Opiates NONE DETECTED  NONE DETECTED   Cocaine POSITIVE (*) NONE DETECTED   Benzodiazepines NONE DETECTED  NONE DETECTED   Amphetamines NONE DETECTED  NONE DETECTED   Tetrahydrocannabinol POSITIVE (*) NONE DETECTED   Barbiturates NONE DETECTED  NONE DETECTED   Comment:            DRUG SCREEN FOR MEDICAL PURPOSES     ONLY.  IF CONFIRMATION IS NEEDED     FOR ANY PURPOSE, NOTIFY LAB     WITHIN 5 DAYS.                LOWEST DETECTABLE LIMITS     FOR URINE DRUG SCREEN     Drug Class       Cutoff (ng/mL)     Amphetamine      1000     Barbiturate      200     Benzodiazepine   616     Tricyclics       073     Opiates          300     Cocaine          300     THC              50   Psychological Evaluations:  Assessment:   DSM5:  Depressive Disorders:  Major Depressive Disorder (296.99)  AXIS I:  Alcohol Abuse and Major Depression, Recurrent severe; cannabis use d/o AXIS II:  Deferred AXIS III:   Past Medical History  Diagnosis Date  . Asthma   . HIV (human immunodeficiency virus infection)     "dx'd ~ 2 yr ago" (09/29/2012)  . Mental disorder   . Anxiety   . Depression   . Chronic low back pain   . Alcohol abuse    AXIS IV:  housing problems, other psychosocial or environmental  problems, problems related to social environment and problems with primary support group AXIS V:  51-60 moderate symptoms  Treatment Plan/Recommendations:  1.) Patient admitted to OBS unit for observation  Current Medications:  No current facility-administered medications for this encounter.    Observation Level/Precautions:  15 minute checks  Laboratory:  N/A  Psychotherapy:    Medications:    Consultations:    Discharge Concerns:    Estimated LOS:  Other:     Admitted to [facility] OBS unit.  I certify that inpatient services furnished can  reasonably be expected to improve the patient's condition.   Gypsy Lore CORI 7/9/20153:52 PM

## 2013-09-20 NOTE — BH Assessment (Signed)
Tele Assessment Note   John [name] is a 43 y.o. male who initially presented to [facility] for issues with "gout"; he was treated and provided crutches to assist him with walking.  Pt told medical staff upon discharge that he was SI with a plan to jump off a bridge. Pt told this writer that he's had SI thoughts for approx 5wks.  Pt says precip event: he ran out of his medication and has not returned to [facility] for refills because of lack of transportation.  Pt states he is currently living with friends and doesn't have a permanent address.  Pt also reports intermittent AH with command to harm self. Pt says last AH episode was 09/18/13.  Pt.'s UDS is positive for cocaine and marijuana, stating that he consumes 2-3 40's, daily.  His last use was 09/19/13.  Pt also uses a "quarter" amount of marijuana, daily. He last used 09/19/13. Pt denies cocaine use, stating that the marijuana maybe laced with cocaine.  Pt is unable to contract for safety.     Axis I: MDD, recurrent, w/psych features; Alcohol use D/O; Cannabis Use D/O  Axis II: Deferred Axis III:  Past Medical History  Diagnosis Date  . Asthma   . HIV (human immunodeficiency virus infection)     "dx'd ~ 2 yr ago" (09/29/2012)  . Mental disorder   . Anxiety   . Depression   . Chronic low back pain   . Alcohol abuse    Axis IV: housing problems, other psychosocial or environmental problems, problems related to social environment and problems with primary support group Axis V: 31-40 impairment in reality testing  Past Medical History:  Past Medical History  Diagnosis Date  . Asthma   . HIV (human immunodeficiency virus infection)     "dx'd ~ 2 yr ago" (09/29/2012)  . Mental disorder   . Anxiety   . Depression   . Chronic low back pain   . Alcohol abuse     Past Surgical History  Procedure Laterality Date  . Skin graft full thickness leg Left ?    POSTERIOR LEFT LEG  AFTER DOG BITES    Family History: No family history on  file.  Social History:  reports that he has been smoking Cigarettes.  He has a 13.5 pack-year smoking history. He has never used smokeless tobacco. He reports that he drinks alcohol. He reports that he uses illicit drugs (Marijuana and Cocaine) about 7 times per week.  Additional Social History:  Alcohol / Drug Use Pain Medications: See MAR  Prescriptions: See MAR  Over the Counter: See MAR  History of alcohol / drug use?: Yes Longest period of sobriety (when/how long): None  Negative Consequences of Use: Work / School;Personal relationships;Financial Withdrawal Symptoms: Other (Comment) (No w/d sxs ) Substance #1 Name of Substance 1: Alcohol  1 - Age of First Use: Teens  1 - Amount (size/oz): 3-40's  1 - Frequency: Daily  1 - Duration: On-going  1 - Last Use / Amount: 09/19/13 Substance #2 Name of Substance 2: Marijuana  2 - Age of First Use: Teens  2 - Amount (size/oz): 1 Blunt  2 - Frequency: Daily  2 - Duration: On-going  2 - Last Use / Amount: 09/19/13  CIWA: CIWA-Ar BP: 125/79 mmHg Pulse Rate: 78 COWS:    Allergies:  Allergies  Allergen Reactions  . Shellfish Allergy Anaphylaxis and Swelling    Home Medications:  (Not in a hospital admission)  OB/GYN Status:  No LMP  for male patient.  General Assessment Data Location of Assessment: [facility] ED Is this a Tele or Face-to-Face Assessment?: Face-to-Face Is this an Initial Assessment or a Re-assessment for this encounter?: Initial Assessment Living Arrangements: Non-relatives/Friends Can pt return to current living arrangement?: Yes Admission Status: Voluntary Is patient capable of signing voluntary admission?: Yes Transfer from: [facility] Referral Source: MD  Medical Screening Exam ([place]) Medical Exam completed: No Reason for MSE not completed: Other:  [place] Living Arrangements: Non-relatives/Friends Name of Psychiatrist: Beverly Sessions  Name of Therapist: [facility]   Education  Status Is patient currently in school?: No Current Grade: None  Highest grade of school patient has completed: None  Name of school: None  Contact person: None  Risk to self Suicidal Ideation: Yes-Currently Present Suicidal Intent: Yes-Currently Present Is patient at risk for suicide?: Yes Suicidal Plan?: Yes-Currently Present Specify Current Suicidal Plan: Jump off bridge  Access to Means: Yes Specify Access to Suicidal Means: Bridges--various locations  What has been your use of drugs/alcohol within the last 12 months?: Abusing: alcohol, thc  Previous Attempts/Gestures: Yes How many times?:  (Unk ) Other Self Harm Risks: None  Triggers for Past Attempts: Unpredictable Intentional Self Injurious Behavior: None Family Suicide History: No Recent stressful life event(s):  (Health; Off meds x5 weeks ) Persecutory voices/beliefs?: No Depression: Yes Depression Symptoms: Loss of interest in usual pleasures;Feeling worthless/self pity Substance abuse history and/or treatment for substance abuse?: Yes Suicide prevention information given to non-admitted patients: Not applicable  Risk to Others Homicidal Ideation: No Thoughts of Harm to Others: No Current Homicidal Intent: No Current Homicidal Plan: No Access to Homicidal Means: No Identified Victim: None  History of harm to others?: No Assessment of Violence: None Noted Violent Behavior Description: None  Does patient have access to weapons?: No Criminal Charges Pending?: No Does patient have a court date: No  Psychosis Hallucinations: Auditory Delusions: None noted  Mental Status Report Appear/Hygiene: Disheveled;In scrubs Eye Contact: Poor Motor Activity: Unsteady (Due to gout; pt has crutches ) Speech: Logical/coherent Level of Consciousness: Alert Mood: Depressed Affect: Depressed;Flat Anxiety Level: None Thought Processes: Coherent;Relevant Judgement: Impaired Orientation: Person;Place;Time;Situation Obsessive  Compulsive Thoughts/Behaviors: None  Cognitive Functioning Concentration: Normal Memory: Recent Intact;Remote Intact IQ: Average Insight: Poor Impulse Control: Poor Appetite: Poor Weight Loss:  (Unk ) Weight Gain: 0 Sleep: Decreased Total Hours of Sleep: 2 Vegetative Symptoms: None  ADLScreening [facility] Assessment Services) Patient's cognitive ability adequate to safely complete daily activities?: Yes Patient able to express need for assistance with ADLs?: Yes Independently performs ADLs?: Yes (appropriate for developmental age)  Prior Inpatient Therapy Prior Inpatient Therapy: Yes Prior Therapy Dates: 2014,2015 Prior Therapy Facilty/Provider([facility]): [facility]  Reason for Treatment: SA/Depression/SI   Prior Outpatient Therapy Prior Outpatient Therapy: Yes Prior Therapy Dates: Current  Prior Therapy Facilty/Provider(s): [facility]  Reason for Treatment: [facility]   ADL Screening (condition at time of admission) Patient's cognitive ability adequate to safely complete daily activities?: Yes Is the patient deaf or have difficulty hearing?: No Does the patient have difficulty seeing, even when wearing glasses/contacts?: No Does the patient have difficulty concentrating, remembering, or making decisions?: No Patient able to express need for assistance with ADLs?: Yes Does the patient have difficulty dressing or bathing?: No Independently performs ADLs?: Yes (appropriate for developmental age) Does the patient have difficulty walking or climbing stairs?: No Weakness of Legs: None Weakness of Arms/Hands: None  Home Assistive Devices/Equipment Home Assistive Devices/Equipment: None  Therapy Consults (therapy consults require a  physician order) PT Evaluation Needed: No OT Evalulation Needed: No SLP Evaluation Needed: No Abuse/Neglect Assessment (Assessment to be complete while patient is alone) Physical Abuse: Denies Verbal Abuse: Denies Sexual Abuse: Denies Exploitation of  patient/patient's resources: Denies Self-Neglect: Denies Values / Beliefs Cultural Requests During Hospitalization: None Spiritual Requests During Hospitalization: None Consults Spiritual Care Consult Needed: No Social Work Consult Needed: No Regulatory affairs officer (For Healthcare) Advance Directive: Patient does not have advance directive;Patient would not like information Pre-existing out of facility DNR order (yellow form or pink MOST form): No Nutrition Screen- [facility] Adult/[facility] Patient's home diet: Regular  Additional Information 1:1 In Past 12 Months?: No CIRT Risk: No Elopement Risk: No Does patient have medical clearance?: Yes     Disposition:  Disposition Initial Assessment Completed for this Encounter: Yes Disposition of Patient: Inpatient treatment program;Referred to (Accepted by Patriciaann Clan, PA pending an avail bed ) Type of inpatient treatment program: Adult Patient referred to: Other (Comment) (Accepted by Patriciaann Clan, PA pending an avail bed )  Girtha Rm 09/20/2013 5:43 AM

## 2013-09-20 NOTE — Progress Notes (Signed)
Patient ID: John [name], male   DOB: 1971/02/21, 43 y.o.   MRN: [id number] Oriented to unit.  Positive for SI. Contracts for safety. States he hears voices telling hom to hurt himself and sees people.  Unable to describe voices or images.Nutrition offered. Education provided regarding safety and falls. Safety checks started every 15 minutes.

## 2013-09-21 ENCOUNTER — Inpatient Hospital Stay (HOSPITAL_COMMUNITY)
Admission: AD | Admit: 2013-09-21 | Payer: No Typology Code available for payment source | Source: Intra-hospital | Admitting: Psychiatry

## 2013-09-21 DIAGNOSIS — M545 Low back pain, unspecified: Secondary | ICD-10-CM | POA: Diagnosis present

## 2013-09-21 DIAGNOSIS — Z5900 Homelessness unspecified: Secondary | ICD-10-CM | POA: Diagnosis not present

## 2013-09-21 DIAGNOSIS — F1410 Cocaine abuse, uncomplicated: Secondary | ICD-10-CM | POA: Diagnosis present

## 2013-09-21 DIAGNOSIS — G4700 Insomnia, unspecified: Secondary | ICD-10-CM | POA: Diagnosis present

## 2013-09-21 DIAGNOSIS — R45850 Homicidal ideations: Secondary | ICD-10-CM | POA: Diagnosis not present

## 2013-09-21 DIAGNOSIS — Z91013 Allergy to seafood: Secondary | ICD-10-CM | POA: Diagnosis not present

## 2013-09-21 DIAGNOSIS — Z598 Other problems related to housing and economic circumstances: Secondary | ICD-10-CM | POA: Diagnosis not present

## 2013-09-21 DIAGNOSIS — F1210 Cannabis abuse, uncomplicated: Secondary | ICD-10-CM | POA: Diagnosis present

## 2013-09-21 DIAGNOSIS — Z5989 Other problems related to housing and economic circumstances: Secondary | ICD-10-CM | POA: Diagnosis not present

## 2013-09-21 DIAGNOSIS — F17200 Nicotine dependence, unspecified, uncomplicated: Secondary | ICD-10-CM | POA: Diagnosis present

## 2013-09-21 DIAGNOSIS — F411 Generalized anxiety disorder: Secondary | ICD-10-CM | POA: Diagnosis present

## 2013-09-21 DIAGNOSIS — Z5987 Material hardship: Secondary | ICD-10-CM | POA: Diagnosis not present

## 2013-09-21 DIAGNOSIS — J45909 Unspecified asthma, uncomplicated: Secondary | ICD-10-CM | POA: Diagnosis present

## 2013-09-21 DIAGNOSIS — G8929 Other chronic pain: Secondary | ICD-10-CM | POA: Diagnosis present

## 2013-09-21 DIAGNOSIS — Z21 Asymptomatic human immunodeficiency virus [HIV] infection status: Secondary | ICD-10-CM | POA: Diagnosis present

## 2013-09-21 DIAGNOSIS — F333 Major depressive disorder, recurrent, severe with psychotic symptoms: Secondary | ICD-10-CM | POA: Diagnosis present

## 2013-09-21 DIAGNOSIS — F1010 Alcohol abuse, uncomplicated: Secondary | ICD-10-CM | POA: Diagnosis present

## 2013-09-21 DIAGNOSIS — R45851 Suicidal ideations: Secondary | ICD-10-CM | POA: Diagnosis not present

## 2013-09-21 NOTE — Progress Notes (Signed)
Patient endorses SI with plan and intent. Patient understands that he will be transferring to unit. Report given To [facility].

## 2013-09-21 NOTE — Tx Team (Signed)
Initial Interdisciplinary Treatment Plan  PATIENT STRENGTHS: (choose at least two) Ability for insight Average or above average intelligence Capable of independent living General fund of knowledge  PATIENT STRESSORS: Medication change or noncompliance Substance abuse   PROBLEM LIST: Problem List/Patient Goals Date to be addressed Date deferred Reason deferred Estimated date of resolution  Suicidal thoughts 09/21/13     Depression 09/21/13                                                DISCHARGE CRITERIA:  Ability to meet basic life and health needs Improved stabilization in mood, thinking, and/or behavior Verbal commitment to aftercare and medication compliance  PRELIMINARY DISCHARGE PLAN: Attend aftercare/continuing care group  PATIENT/FAMIILY INVOLVEMENT: This treatment plan has been presented to and reviewed with the patient, John [name], and/or family member, .  The patient and family have been given the opportunity to ask questions and make suggestions.  John [name], [place] 09/21/2013, 4:05 PM

## 2013-09-21 NOTE — Progress Notes (Signed)
Girard Cooter NP called to get an order for transfer to Adult Unit.

## 2013-09-21 NOTE — Progress Notes (Signed)
Psychoeducational Group Note  Date:  09/21/2013 Time:  2114  Group Topic/Focus:  Wrap-Up Group:   The focus of this group is to help patients review their daily goal of treatment and discuss progress on daily workbooks.  Participation Level: Did Not Attend  Participation Quality:  Not Applicable  Affect:  Not Applicable  Cognitive:  Not Applicable  Insight:  Not Applicable  Engagement in Group: Not Applicable  Additional Comments:  The patient did not attend group this evening since he was asleep in his bed.   Ruta Capece S 09/21/2013, 9:15 PM

## 2013-09-21 NOTE — Plan of Care (Addendum)
[place] Observation Crisis Plan  Reason for Crisis Plan:  Crisis Stabilization   Plan of Care:  Referral for Inpatient Hospitalization  Family Support:    Jonni Sanger (aunt) [phone]; pt has signed Consent for Release of Information to her.  Current Living Environment:  Living Arrangements: Other (Comment) (homeless); stays with friends intermittently, otherwise he is homeless  Insurance:  [facility] Account   Name Acct ID Class Status Primary Coverage   Chuong, [place] [id number] [facility] Inpatient Special Open None        Guarantor Account (for Hospital Account [id number])   Name Relation to Pt Service Area Active? Acct Type   [place] [facility]   Address Phone       [address] [place], [place] [address] [phone])          Coverage Information (for Hospital Account [id number])   Not on file      Legal Guardian:   Self  Primary Care Provider:  [facility] [address] in [place]  Current Outpatient Providers:  Beverly Sessions  Psychiatrist:   Beverly Sessions  Counselor/Therapist:   Beverly Sessions  Compliant with Medications:  No; compliant with non-psychotropics, but cannot afford psychotropics.  Additional Information: After consulting with Catalina Pizza, NP it has been determined that pt presents a life threatening danger to himself, for which psychiatric hospitalization is indicated.  Pt accepted to [facility] to the service of Neita Garnet, MD, Rm [id number].  Pt has signed Voluntary Admission and Consent for Treatment.  He has also signed Consent to Release Information to his aunt, Jonni Sanger, and to Whitney, his outpatient provider.  A notification call has been placed to [facility] on 09/21/2013 at 15:35.  Jalene Mullet, [place] Triage Specialist Abbe Amsterdam 7/10/20153:28 PM

## 2013-09-21 NOTE — Progress Notes (Signed)
43 year old male pt admitted on voluntary basis, transferred from observation unit to the adult unit. Pt spoke about how he was feeling suicidal for the past few weeks, has been using various substances and has not been taking his medications as prescribed. Pt spoke about wanting to get back on his medications while here and is able to contract for safety on the unit. Pt was oriented to the unit and safety maintained.

## 2013-09-21 NOTE — ED Provider Notes (Signed)
Medical screening examination/treatment/procedure(s) were performed by non-physician practitioner and as supervising physician I was immediately available for consultation/collaboration.   EKG Interpretation None        Osvaldo Shipper, MD 09/21/13 0001

## 2013-09-21 NOTE — Discharge Summary (Signed)
OBS UNIT DISCHARGE SUMMARY   Patient Identification:  Elise Gladden Date of Evaluation:  09/21/2013 Chief Complaint:  MDD RECURRENT SEVERE WITH PSYCHOTIC FEATURES  History of Present Illness:: 10 yaer old male who presented to [facility] for issues with gout.  He was treated and was given crutches to assist with ambulation.  Upon discharge patient proceeded to inform staff that he was suicidal with a plan to jump off of a bridge.  Per counselors assessment patient has been experiencing SI x 5 weeks.  He informs this Probation officer that the death of 3 family members (2 brothers and his grandmother) in the same week.  He continues, "If I had been taking my medication I would probably ne able to deal with things".  He expresses concern re: scheduled medication to include his inhaler.  He reports SI presently with a plan to jump off of a bridge.  Denies SI or AVH.        Subjective: Pt seen and chart reviewed. Pt is very tearful and reporting that he has a plan to jump off of a parking deck. Pt cannot contract for safety. However, he denies HI and AVH. Pt will be admitted to [facility] for suicidality.   Associated Signs/Synptoms: Depression Symptoms:  depressed mood, Anxiety Symptoms:  Excessive Worry PTSD Symptoms: NA Total Time spent with patient: 25 minutes  Psychiatric Specialty Exam:  Physical Exam  ROS  Blood pressure 109/73, pulse 80, temperature 98 F (36.7 C), temperature source Oral, resp. rate 16, height 5' 7.5" (1.715 m), weight 86.864 kg (191 lb 8 oz), SpO2 97.00%.Body mass index is 29.53 kg/(m^2).  General Appearance: Disheveled  Eye Sport and exercise psychologist::  Fair  Speech:  Clear and Coherent  Volume:  Normal  Mood:  Anxious and Depressed  Affect:  Depressed  Thought Process:  Circumstantial  Orientation:  Full (Time, Place, and Person)  Thought Content:  WDL  Suicidal Thoughts:  Yes.  with intent/plan  Homicidal Thoughts:  No  Memory:  Immediate;   Good Recent;   Good  Judgement:  Impaired  Insight:   Lacking  Psychomotor Activity:  Tremor  Concentration:  Fair  Recall:  [employer] of Knowledge:Poor  Language: Fair  Akathisia:  Negative  Handed:    AIMS (if indicated):     Assets:  Communication Skills  Sleep:       Psychological Evaluations:  Assessment:   DSM5:  Depressive Disorders:  Major Depressive Disorder (296.99)  AXIS I:  Alcohol Abuse and Major Depression, Recurrent severe; cannabis use d/o AXIS II:  Deferred AXIS III:   Past Medical History  Diagnosis Date  . Asthma   . HIV (human immunodeficiency virus infection)     "dx'd ~ 2 yr ago" (09/29/2012)  . Mental disorder   . Anxiety   . Depression   . Chronic low back pain   . Alcohol abuse    AXIS IV:  housing problems, other psychosocial or environmental problems, problems related to social environment and problems with primary support group AXIS V:  51-60 moderate symptoms  Treatment Plan/Recommendations:  Admit to inpatient [facility]  I certify that inpatient services furnished can reasonably be expected to improve the patient's condition.   Benjamine Mola 09/21/2013 6:44 PM

## 2013-09-22 MED ORDER — HYDROXYZINE HCL 25 MG PO TABS
25.0000 mg | ORAL_TABLET | Freq: Three times a day (TID) | ORAL | Status: DC | PRN
  Administered 2013-09-23: 25 mg via ORAL
  Filled 2013-09-22: qty 1

## 2013-09-22 MED ORDER — SERTRALINE HCL 50 MG PO TABS
50.0000 mg | ORAL_TABLET | Freq: Every day | ORAL | Status: DC
  Administered 2013-09-22 – 2013-09-24 (×3): 50 mg via ORAL
  Filled 2013-09-22 (×6): qty 1

## 2013-09-22 NOTE — BHH Suicide Risk Assessment (Signed)
Patient ID: John [name], male DOB: Dec 11, 1970, 43 y.o. MRN: [id number]   Nursing information obtained from:   pt Demographic factors:   male, AA Current Mental Status:   depressed with SI with plan Loss Factors:   recent loss of grandmother and 2 brothers all within same week Historical Factors:   hx of depression, hx of suicide attempts, denies family hx of mental illness and suicide Risk Reduction Factors:   none Total Time spent with patient: 20 minutes  CLINICAL FACTORS:   Severe Anxiety and/or Agitation Depression:   Anhedonia Hopelessness Severe Previous Psychiatric Diagnoses and Treatments Medical Diagnoses and Treatments/Surgeries Command hallucinations telling him to kill himself  Psychiatric Specialty Exam: Physical Exam  Review of Systems  Constitutional: Positive for weight loss. Negative for fever, chills and malaise/fatigue.  Eyes: Negative.   Respiratory: Positive for wheezing.   Cardiovascular: Negative for chest pain and palpitations.  Gastrointestinal: Positive for diarrhea.  Musculoskeletal: Positive for back pain.  Skin: Negative for itching and rash.  Neurological: Negative for dizziness, tingling and headaches.  Psychiatric/Behavioral: Positive for depression, suicidal ideas and hallucinations. The patient is nervous/anxious.     Blood pressure 118/82, pulse 101, temperature 97.6 F (36.4 C), temperature source Oral, resp. rate 16, height 5' 7.5" (1.715 m), weight 86.864 kg (191 lb 8 oz), SpO2 97.00%.Body mass index is 29.53 kg/(m^2).  General Appearance: Casual  Eye Contact::  Good  Speech:  Clear and Coherent  Volume:  Normal  Mood:  Depressed  Affect:  Congruent and Constricted  Thought Process:  Linear and Logical  Orientation:  Full (Time, Place, and Person)  Thought Content:  Hallucinations: Auditory Command:  kill himself  Suicidal Thoughts:  Yes.  with intent/plan  Homicidal Thoughts:  No  Memory:  Immediate;   Fair Recent;   Fair Remote;    Fair  Judgement:  Fair  Insight:  Fair  Psychomotor Activity:  Normal  Concentration:  Good  Recall:  Good  Fund of Knowledge:Good  Language: Fair  Akathisia:  No  Handed:  Right  AIMS (if indicated):     Assets:  Communication Skills Desire for Improvement  Sleep:  Number of Hours: 6.75   Musculoskeletal: Strength & Muscle Tone: within normal limits Gait & Station: normal Patient leans: N/A  COGNITIVE FEATURES THAT CONTRIBUTE TO RISK:  Thought constriction (tunnel vision)    SUICIDE RISK:   Moderate:  Frequent suicidal ideation with limited intensity, and duration, some specificity in terms of plans, no associated intent, good self-control, limited dysphoria/symptomatology, some risk factors present, and identifiable protective factors, including available and accessible social support.  PLAN OF CARE: Start trial of Zoloft 50mg  po qD for depression and anxiety Continue Seroquel 300mg  po qD for mood Start trial of Vistaril 25mg  po TID prn anxiety  I certify that inpatient services furnished can reasonably be expected to improve the patient's condition.  Charlcie Cradle 09/22/2013, 2:54 PM

## 2013-09-22 NOTE — Progress Notes (Signed)
Psychoeducational Group Note  Date: 09/22/2013 Time: 1015  Group Topic/Focus:  Identifying Needs:   The focus of this group is to help patients identify their personal needs that have been historically problematic and identify healthy behaviors to address their needs.  Participation Level:  Active  Participation Quality:  Attentive  Affect:  Appropriate  Cognitive:  Appropriate  Insight:  Engaged  Engagement in Group:  Engaged  Additional Comments:    7/11/20151:10 PM Zac Torti, Trixie Rude

## 2013-09-22 NOTE — H&P (Signed)
I agree with the assessment and plan.

## 2013-09-22 NOTE — H&P (Signed)
Patient admitted to [facility]. observation unit for stabilization and treatment of depression

## 2013-09-22 NOTE — Progress Notes (Signed)
D) Pt has attended the groups and interacts with his peers appropriately. Rates his depression at a 3 and his hopelessness at a 5. Has thoughts of SI. Pt states that he has a main problem with anger and is looking and trying to learn a new way to deal with his anger and to control how he behaves with his anger. A) Verbal contract received from Pt. Pt given support, reassurance and prasie. Provided with a 1:1. R) Pt is showing insight into his issues and states that he wants to learn to be able to do things in a healthier way.

## 2013-09-22 NOTE — H&P (Signed)
Psychiatric Admission Assessment Adult  Patient Identification:  John [name] Date of Evaluation:  09/22/2013 Chief Complaint:  "I am depressed and hearing voices."   History of Present Illness::  John [name] is a 43 year old male who initially presented to the [facility] for complaints of gout related pain. Upon discharge the patient informed ED staff of his desire to jump from a bridge. Patient reported having run out of medications due to not having transportation to [place]. John [name] is currently homeless as he is staying with friends. Patient states today during his psychiatric assessment "The voices started a week ago. They tell me to jump off a bridge. That it would be better for me to die. I am depressed. I have had several recent losses. My brother died in a drive by shooting and my grandmother in a car accident. I have nobody left that I really care about. I just want to be with them. I've been off medications for three to four months. I might have handled all this better if I had been able to take them. I have been homeless for years. I have HIV. I have never taken any medications for it. I am supposed to see the ID Doctor later this month. They will let me know if I need to start taking them or not." [name] becomes very tearful during the interview when discussing his numerous stressors. He appears very depressed. Rates his depression at seven. Patient reports that he continues to experience auditory hallucinations and to feel suicidal. Patient admits to marijuana use to cope with his feelings. He denies cocaine reporting that the marijuana must have been laced with it.   Associated Signs/Synptoms: Depression Symptoms:  depressed mood, anhedonia, psychomotor retardation, fatigue, feelings of worthlessness/guilt, difficulty concentrating, hopelessness, recurrent thoughts of death, suicidal thoughts with specific plan, anxiety, insomnia, loss of energy/fatigue, disturbed  sleep, weight loss, Anxiety Symptoms:  Excessive Worry, Manic Symptoms: Denies Psychotic Symptoms: Auditory Hallucinations  PTSD Symptoms: NA Total Time spent with patient: 45 minutes  Psychiatric Specialty Exam: Physical Exam  Constitutional:  Reviewed complete physical that was completed in the [facility] and I concur with no noted exceptions.     Review of Systems  Constitutional: Positive for malaise/fatigue.  HENT: Negative.   Eyes: Negative.   Respiratory: Negative.   Cardiovascular: Negative.   Gastrointestinal: Negative.   Genitourinary: Negative.   Musculoskeletal: Negative.   Skin: Negative.   Neurological: Negative.   Endo/Heme/Allergies: Negative.   Psychiatric/Behavioral: Positive for depression, suicidal ideas, hallucinations and substance abuse. The patient is nervous/anxious and has insomnia.     Blood pressure 118/82, pulse 101, temperature 97.6 F (36.4 C), temperature source Oral, resp. rate 16, height 5' 7.5" (1.715 m), weight 86.864 kg (191 lb 8 oz), SpO2 97.00%.Body mass index is 29.53 kg/(m^2).  General Appearance: Disheveled  Eye Sport and exercise psychologist::  Fair  Speech:  Clear and Coherent  Volume:  Normal  Mood:  Anxious and Depressed  Affect:  Depressed  Thought Process:  Circumstantial  Orientation:  Full (Time, Place, and Person)  Thought Content:  Hallucinations: Auditory  Suicidal Thoughts:  Yes.  with intent/plan  Homicidal Thoughts:  No  Memory:  Immediate;   Good Recent;   Good  Judgement:  Impaired  Insight:  Lacking  Psychomotor Activity:  Tremor  Concentration:  Fair  Recall:  [name] of Knowledge:Poor  Language: Fair  Akathisia:  Negative  Handed:    AIMS (if indicated):     Assets:  Communication Skills Desire  for Improvement Leisure Time Resilience  Sleep:  Number of Hours: 6.75    Past Psychiatric History: Diagnosis: MDD, recurrent severe  Hospitalizations: multiple; frequent  Outpatient Care: [facility]   Substance Abuse Care:   Self-Mutilation: No        Past Medical History:   Past Medical History  Diagnosis Date  . Asthma   . HIV (human immunodeficiency virus infection)     "dx'd ~ 2 yr ago" (09/29/2012)  . Mental disorder   . Anxiety   . Depression   . Chronic low back pain   . Alcohol abuse    None. Allergies:   Allergies  Allergen Reactions  . Shellfish Allergy Anaphylaxis and Swelling   PTA Medications: Prescriptions prior to admission  Medication Sig Dispense Refill  . albuterol (PROVENTIL HFA;VENTOLIN HFA) 108 (90 BASE) MCG/ACT inhaler Inhale 2 puffs into the lungs every 4 (four) hours as needed for wheezing or shortness of breath.      Marland Kitchen HYDROcodone-acetaminophen (NORCO/VICODIN) 5-325 MG per tablet Take 2 tablets by mouth every 6 (six) hours as needed for moderate pain or severe pain.  6 tablet  0  . QUEtiapine (SEROQUEL) 100 MG tablet Take 1 twice a day, and 1.5 at bedtime  105 tablet  0  . traZODone (DESYREL) 100 MG tablet Take 1 tablet (100 mg total) by mouth at bedtime.  30 tablet  0  . indomethacin (INDOCIN) 25 MG capsule Take 1 capsule (25 mg total) by mouth 2 (two) times daily with a meal.  30 capsule  0    Previous Psychotropic Medications:  Medication/Dose  See lists               Substance Abuse History in the last 12 months:  Yes.   UDS positive for cocaine and marijuana.   Consequences of Substance Abuse: Possible worsening of mental health symptoms.   Social History:  reports that he has been smoking Cigarettes.  He has a 13.5 pack-year smoking history. He has never used smokeless tobacco. He reports that he drinks about 29.4 ounces of alcohol per week. He reports that he uses illicit drugs (Marijuana and Cocaine) about 7 times per week. Additional Social History: Pain Medications: See MAR  Prescriptions: See MAR  Over the Counter: See MAR  History of alcohol / drug use?: Yes Negative Consequences of Use: Work / School;Personal relationships;Financial Withdrawal  Symptoms: Other (Comment) Name of Substance 1: Alcohol  1 - Age of First Use: Teens  1 - Amount (size/oz): 3-40's  1 - Frequency: Daily  1 - Duration: On-going  1 - Last Use / Amount: 09/19/13 Name of Substance 2: Marijuana  2 - Age of First Use: Teens  2 - Amount (size/oz): 1 Blunt  2 - Frequency: Daily  2 - Duration: On-going  2 - Last Use / Amount: 09/19/13                Current Place of Residence:   Place of Birth:   Family Members: Marital Status:  Single Children:0  Sons:  Daughters: Relationships: Education:  9th grade  Educational Problems/Performance: Religious Beliefs/Practices: History of Abuse (Emotional/Phsycial/Sexual) Occupational Experiences; Military History:  None. Legal History: Hobbies/Interests:  Family History:  History reviewed. No pertinent family history.  Results for orders placed during the hospital encounter of 09/19/13 (from the past 72 hour(s))  CBC WITH DIFFERENTIAL     Status: None   Collection Time    09/19/13  7:57 PM      Result  Value Ref Range   WBC 6.8  4.0 - 10.5 K/uL   RBC 4.77  4.22 - 5.81 MIL/uL   Hemoglobin 14.8  13.0 - 17.0 g/dL   HCT 43.2  39.0 - 52.0 %   MCV 90.6  78.0 - 100.0 fL   MCH 31.0  26.0 - 34.0 pg   MCHC 34.3  30.0 - 36.0 g/dL   RDW 13.9  11.5 - 15.5 %   Platelets 330  150 - 400 K/uL   Neutrophils Relative % 45  43 - 77 %   Neutro Abs 3.1  1.7 - 7.7 K/uL   Lymphocytes Relative 43  12 - 46 %   Lymphs Abs 2.9  0.7 - 4.0 K/uL   Monocytes Relative 9  3 - 12 %   Monocytes Absolute 0.6  0.1 - 1.0 K/uL   Eosinophils Relative 3  0 - 5 %   Eosinophils Absolute 0.2  0.0 - 0.7 K/uL   Basophils Relative 0  0 - 1 %   Basophils Absolute 0.0  0.0 - 0.1 K/uL  BASIC METABOLIC PANEL     Status: Abnormal   Collection Time    09/19/13  7:57 PM      Result Value Ref Range   Sodium 135 (*) 137 - 147 mEq/L   Potassium 3.6 (*) 3.7 - 5.3 mEq/L   Chloride 96  96 - 112 mEq/L   CO2 22  19 - 32 mEq/L   Glucose, Bld 138  (*) 70 - 99 mg/dL   BUN 8  6 - 23 mg/dL   Creatinine, Ser 0.92  0.50 - 1.35 mg/dL   Calcium 9.4  8.4 - 10.5 mg/dL   GFR calc non Af Amer >90  >90 mL/min   GFR calc Af Amer >90  >90 mL/min   Comment: (NOTE)     The eGFR has been calculated using the CKD EPI equation.     This calculation has not been validated in all clinical situations.     eGFR's persistently <90 mL/min signify possible Chronic Kidney     Disease.   Anion gap 17 (*) 5 - 15  ACETAMINOPHEN LEVEL     Status: None   Collection Time    09/19/13  7:57 PM      Result Value Ref Range   Acetaminophen (Tylenol), Serum <15.0  10 - 30 ug/mL   Comment:            THERAPEUTIC CONCENTRATIONS VARY     SIGNIFICANTLY. A RANGE OF 10-30     ug/mL MAY BE AN EFFECTIVE     CONCENTRATION FOR MANY PATIENTS.     HOWEVER, SOME ARE BEST TREATED     AT CONCENTRATIONS OUTSIDE THIS     RANGE.     ACETAMINOPHEN CONCENTRATIONS     >150 ug/mL AT 4 HOURS AFTER     INGESTION AND >50 ug/mL AT 12     HOURS AFTER INGESTION ARE     OFTEN ASSOCIATED WITH TOXIC     REACTIONS.  SALICYLATE LEVEL     Status: Abnormal   Collection Time    09/19/13  7:57 PM      Result Value Ref Range   Salicylate Lvl <1.6 (*) 2.8 - 20.0 mg/dL  URINE RAPID DRUG SCREEN (HOSP PERFORMED)     Status: Abnormal   Collection Time    09/19/13  8:09 PM      Result Value Ref Range   Opiates NONE DETECTED  NONE  DETECTED   Cocaine POSITIVE (*) NONE DETECTED   Benzodiazepines NONE DETECTED  NONE DETECTED   Amphetamines NONE DETECTED  NONE DETECTED   Tetrahydrocannabinol POSITIVE (*) NONE DETECTED   Barbiturates NONE DETECTED  NONE DETECTED   Comment:            DRUG SCREEN FOR MEDICAL PURPOSES     ONLY.  IF CONFIRMATION IS NEEDED     FOR ANY PURPOSE, NOTIFY LAB     WITHIN 5 DAYS.                LOWEST DETECTABLE LIMITS     FOR URINE DRUG SCREEN     Drug Class       Cutoff (ng/mL)     Amphetamine      1000     Barbiturate      200     Benzodiazepine   161      Tricyclics       096     Opiates          300     Cocaine          300     THC              50   Psychological Evaluations:  Assessment:   DSM5:  Depressive Disorders:  Major Depressive Disorder (296.99)  AXIS I: Major Depressive Disorder with psychotic features.             Cocaine abuse             Cannabis abuse  AXIS II:  Deferred AXIS III:   Past Medical History  Diagnosis Date  . Asthma   . HIV (human immunodeficiency virus infection)     "dx'd ~ 2 yr ago" (09/29/2012)  . Mental disorder   . Anxiety   . Depression   . Chronic low back pain   . Alcohol abuse    AXIS IV:  housing problems, other psychosocial or environmental problems, problems related to social environment and problems with primary support group AXIS V:  31-40 impairment in reality testing  Treatment Plan/Recommendations:   1. Admit for crisis management and stabilization. Estimated length of stay 5-7 days. 2. Medication management to reduce current symptoms to base line and improve the patient's level of functioning.  3. Develop treatment plan to decrease risk of relapse upon discharge of depressive and psychotic symptoms and the need for readmission. 5. Group therapy to facilitate development of healthy coping skills to use for depression and psychosis.  6. Health care follow up as needed for medical problems. Continue Indomethacin 25 mg BID for gout.  7. Discharge plan to include therapy to help patient cope with stressors.  8. Call for Consult with Hospitalist for additional specialty patient services as needed.  Current Medications:  Current Facility-Administered Medications  Medication Dose Route Frequency Provider Last Rate Last Dose  . acetaminophen (TYLENOL) tablet 650 mg  650 mg Oral Q6H PRN Shuvon Rankin, NP   650 mg at 09/21/13 1831  . albuterol (PROVENTIL HFA;VENTOLIN HFA) 108 (90 BASE) MCG/ACT inhaler 2 puff  2 puff Inhalation Q4H PRN Shuvon Rankin, NP   2 puff at 09/22/13 0454  . alum &  mag hydroxide-simeth (MAALOX/MYLANTA) 200-200-20 MG/5ML suspension 30 mL  30 mL Oral Q4H PRN Shuvon Rankin, NP      . hydrOXYzine (ATARAX/VISTARIL) tablet 25 mg  25 mg Oral TID PRN Charlcie Cradle, MD      . indomethacin (INDOCIN) capsule  25 mg  25 mg Oral BID WC Shuvon Rankin, NP   25 mg at 09/22/13 1201  . magnesium hydroxide (MILK OF MAGNESIA) suspension 30 mL  30 mL Oral Daily PRN Shuvon Rankin, NP      . QUEtiapine (SEROQUEL) tablet 300 mg  300 mg Oral QHS Shuvon Rankin, NP   300 mg at 09/21/13 2344  . sertraline (ZOLOFT) tablet 50 mg  50 mg Oral Daily Charlcie Cradle, MD      . traZODone (DESYREL) tablet 100 mg  100 mg Oral QHS Shuvon Rankin, NP   100 mg at 09/21/13 2342    Observation Level/Precautions:  15 minute checks  Laboratory:  CBC Chemistry Profile UDS  Psychotherapy:  Individual and Group Therapy  Medications: Start Zoloft 50 mg daily for depression, Seroquel 300 mg hs for psychosis, Trazodone 100 mg hs for insomnia   Consultations:  As needed  Discharge Concerns:  Continued substance abuse   Estimated LOS: 5-7 days  Other:     I certify that inpatient services furnished can reasonably be expected to improve the patient's condition.   Elmarie Shiley NP-C 7/11/20153:18 PM

## 2013-09-22 NOTE — BHH Group Notes (Signed)
[facility] Group Notes:  (Clinical Social Work)  09/22/2013   1:15-2:15PM  Summary of Progress/Problems:   The main focus of today's process group was for the patient to identify ways in which they have sabotaged their own mental health wellness/recovery.  Motivational interviewing and a handout were used to explore the benefits and costs of their self-sabotaging behavior as well as the benefits and costs of changing this behavior.  The Stages of Change were explained to the group using a handout, and patients identified where they are with regard to changing self-defeating behaviors.  The patient expressed he self-sabotages with people pleasing.  He spoke very little, seemed almost asleep at times.  When he was called on, he looked at [place] with irritable/angry affect but spoke calmly.  Type of Therapy:  Process Group  Participation Level:  Active  Participation Quality:  Attentive  Affect:  Angry, Blunted and Irritable  Cognitive:  Oriented  Insight:  Improving  Engagement in Therapy:  Limited  Modes of Intervention:  Education, Motivational Interviewing   Selmer Dominion, LCSW 09/22/2013, 4:00pm

## 2013-09-22 NOTE — BHH Counselor (Signed)
Adult Psychosocial Assessment Update Interdisciplinary Team  Previous [facility] admissions/discharges:  Admissions Discharges  Date:  05/08/13 Date:  Date:  05/23/13 Date:  Date: Date:  Date: Date:  Date: Date:   Changes since the last Psychosocial Assessment (including adherence to outpatient mental health and/or substance abuse treatment, situational issues contributing to decompensation and/or relapse). Since his last discharge, John [name] and his boyfriend have broken up.  He remains homeless, without income, and has a hard time getting his medications as a result.  He goes to the RN at the [facility].  He is currently staying with friends.  He denies drug use.             Discharge Plan 1. Will you be returning to the same living situation after discharge?   Yes:XX No:      If no, what is your plan?    Is homeless, is going to call [facility] to see if he is eligible to return there yet.       2. Would you like a referral for services when you are discharged? Yes:  XX   If yes, for what services?  No:       Back to [facility] for walk-in refill clinic.       Summary and Recommendations (to be completed by the evaluator) This is a 43yo African-American male who was hospitalized with suicidal ideation.  He denies drug use although he did test positive for both marijuana and cocaine.  He goes to [facility] and did follow up at his lats discharge, but has not returned due to lack of money for medications.  He was educated during assessment about $4 list as well as Patient Games developer available at times.  He would benefit from safety monitoring, medication evaluation, psychoeducation, group therapy, and discharge planning to link with ongoing resources.                        Signature:  Lysle Dingwall, 09/22/2013 9:50 AM

## 2013-09-22 NOTE — Progress Notes (Signed)
Psychoeducational Group Note  Date:  10/23/2011 Time:  0930  Group Topic/Focus:  Identifying Needs:   The focus of this group is to help patients identify their personal goals and discuss ways to accomplish these goals.  Participation Quality: good Affect: flat Cognitive:    Insight:  good  Engagement in Group: engaged  Additional Comments:   P Novalee Horsfall RN BC 10:32

## 2013-09-22 NOTE — Discharge Summary (Signed)
Patient seen, seems overwhelmed, still sad having suicidal thoughts with a plan to jump off a parking deck. Patient needs inpatient psychiatric admission

## 2013-09-22 NOTE — Progress Notes (Signed)
Patient ID: John [name], male   DOB: 12-24-1970, 43 y.o.   MRN: [id number]  D: Patient isolative to his room for the majority of shift. Pt refusing to wake up for his HS medications. Pt did not participate in group session. A: Q 15 minute safety checks, encourage expression of feelings. Encourage group participation and peer interaction. Administer medications as ordered by MD. R: Patient denies SI or plans to harm himself. No complaints.

## 2013-09-23 MED ORDER — ENSURE COMPLETE PO LIQD
237.0000 mL | Freq: Three times a day (TID) | ORAL | Status: DC
  Administered 2013-09-23 – 2013-09-27 (×10): 237 mL via ORAL

## 2013-09-23 NOTE — Progress Notes (Signed)
Adult Psychoeducational Group Note  Date:  09/23/2013 Time:  9:32 PM  Group Topic/Focus:  Wrap-Up Group:   The focus of this group is to help patients review their daily goal of treatment and discuss progress on daily workbooks.  Participation Level:  Minimal  Participation Quality:  Resistant  Affect:  Flat and Resistant  Cognitive:  Lacking  Insight: Lacking  Engagement in Group:  Lacking  Modes of Intervention:  Discussion, Education and Support  Additional Comments:  Pt was not very active or sharing during this group. Pt rated his day at a 10 out of 10 because he talked to "his father upstairs".  Rocky Crafts 09/23/2013, 9:32 PM

## 2013-09-23 NOTE — Progress Notes (Signed)
[facility] MD Progress Note  09/23/2013 12:51 PM John [name]  MRN:  [id number]  Subjective: "so far so good" Spoke with pt and reviewed the chart. He has been attending groups.   States voices have decreased in volume. Voices continue to tell him to kill himself.  Depression is unchanged. Anxiety is high and he is asking for something to help him.   Pt is taking meds as prescribed and denies SE.  Diagnosis:  Major Depressive Disorder with psychotic features.  Cocaine abuse  Cannabis abuse    Total Time spent with patient: 20 minutes    ADL's:  Intact  Sleep: Good  Appetite:  Poor  Suicidal Ideation:  Plan:  cut self with spoon Intent:  denies Means:  plastic spoons Homicidal Ideation: yes directed toward someone on unit. Today decreased in intensity and denies intention of hurting anyone.  AEB (as evidenced by):  Psychiatric Specialty Exam: Physical Exam  Review of Systems  Constitutional: Positive for malaise/fatigue.  Psychiatric/Behavioral: Positive for depression, suicidal ideas and hallucinations. The patient is nervous/anxious.     Blood pressure 131/82, pulse 106, temperature 98.3 F (36.8 C), temperature source Oral, resp. rate 18, height 5' 7.5" (1.715 m), weight 86.864 kg (191 lb 8 oz), SpO2 97.00%.Body mass index is 29.53 kg/(m^2).  General Appearance: Casual and Neat  Eye Contact::  Good  Speech:  Clear and Coherent and Normal Rate  Volume:  Normal  Mood:  Depressed  Affect:  Congruent and more reactive today  Thought Process:  Goal Directed  Orientation:  Full (Time, Place, and Person)  Thought Content:  Hallucinations: Command:  telling him to committ suicide  Suicidal Thoughts:  Yes with plan, denies intent  Homicidal Thoughts:  Yes.  without intent/plan  Memory:  Immediate;   Fair Recent;   Fair Remote;   Fair  Judgement:  Fair  Insight:  Fair  Psychomotor Activity:  Normal  Concentration:  Good  Recall:  Good  Fund of Knowledge:Good   Language: Good  Akathisia:  No  Handed:  Right  AIMS (if indicated):     Assets:  Communication Skills Desire for Improvement  Sleep:  Number of Hours: 6.5   Musculoskeletal: Strength & Muscle Tone: within normal limits Gait & Station: normal Patient leans: N/A  Current Medications: Current Facility-Administered Medications  Medication Dose Route Frequency Provider Last Rate Last Dose  . acetaminophen (TYLENOL) tablet 650 mg  650 mg Oral Q6H PRN Shuvon Rankin, NP   650 mg at 09/21/13 1831  . albuterol (PROVENTIL HFA;VENTOLIN HFA) 108 (90 BASE) MCG/ACT inhaler 2 puff  2 puff Inhalation Q4H PRN Shuvon Rankin, NP   2 puff at 09/22/13 0459  . alum & mag hydroxide-simeth (MAALOX/MYLANTA) 200-200-20 MG/5ML suspension 30 mL  30 mL Oral Q4H PRN Shuvon Rankin, NP      . hydrOXYzine (ATARAX/VISTARIL) tablet 25 mg  25 mg Oral TID PRN Charlcie Cradle, MD      . indomethacin (INDOCIN) capsule 25 mg  25 mg Oral BID WC Shuvon Rankin, NP   25 mg at 09/23/13 0858  . magnesium hydroxide (MILK OF MAGNESIA) suspension 30 mL  30 mL Oral Daily PRN Shuvon Rankin, NP      . QUEtiapine (SEROQUEL) tablet 300 mg  300 mg Oral QHS Shuvon Rankin, NP   300 mg at 09/22/13 2119  . sertraline (ZOLOFT) tablet 50 mg  50 mg Oral Daily Charlcie Cradle, MD   50 mg at 09/23/13 0858  . traZODone (DESYREL) tablet 100 mg  100 mg Oral QHS Shuvon Rankin, NP   100 mg at 09/22/13 2119    Lab Results: No results found for this or any previous visit (from the past 48 hour(s)).  Physical Findings: AIMS: Facial and Oral Movements Muscles of Facial Expression: Severe Lips and Perioral Area: Severe Jaw: Severe Tongue: Severe,Extremity Movements Upper (arms, wrists, hands, fingers): Severe Lower (legs, knees, ankles, toes): Severe, Trunk Movements Neck, shoulders, hips: Severe, Overall Severity Severity of abnormal movements (highest score from questions above): Severe Incapacitation due to abnormal movements: Severe Patient's  awareness of abnormal movements (rate only patient's report): Aware, severe distress, Dental Status Current problems with teeth and/or dentures?: No Does patient usually wear dentures?: No  CIWA:  CIWA-Ar Total: 1 COWS:     Treatment Plan Summary: Daily contact with patient to assess and evaluate symptoms and progress in treatment Medication management  Plan: Seroquel 300mg  po qD Zoloft 50mg  po qD Trazodone 100mg  po qHS  Vistaril 25mg  po TID prn anxiety  Medical Decision Making Problem Points:  Established problem, stable/improving (1) and New problem, with no additional work-up planned (3) Data Points:  Review of medication regiment & side effects (2)  I certify that inpatient services furnished can reasonably be expected to improve the patient's condition.   Charlcie Cradle 09/23/2013, 12:51 PM

## 2013-09-23 NOTE — Progress Notes (Signed)
NUTRITION ASSESSMENT  Pt identified as at risk on the Malnutrition Screen Tool  INTERVENTION: 1. Educated patient on the importance of nutrition and encouraged intake of food and beverages. 2. Discussed weight goals. 3. Supplements: Ensure Complete TID, each provides 350 kcal, 13 g protein  NUTRITION DIAGNOSIS: Unintentional weight loss related to sub-optimal intake as evidenced by pt report.   Goal: Pt to meet >/= 90% of their estimated nutrition needs.  Monitor:  PO intake  Assessment:  Patient admitted with major depressive disorder and a history of HIV. He is currently homeless, staying with friends. He reports that his appetite has been poor for the last month, which is at least partially due to stomach pains, which he describes as throbbing, with shooting pains occasionally. He reports that this is continuing after admission, but appetite has improved slightly, but not back to baseline. He is interested in receiving supplements to promote nutrition. He reports a 16 pound weight loss since April (8% UBW).   43 y.o. male  Height: Ht Readings from Last 1 Encounters:  09/20/13 5' 7.5" (1.715 m)    Weight: Wt Readings from Last 1 Encounters:  09/20/13 191 lb 8 oz (86.864 kg)    Weight Hx: Wt Readings from Last 10 Encounters:  09/20/13 191 lb 8 oz (86.864 kg)  08/17/13 196 lb 12 oz (89.245 kg)  06/14/13 207 lb (93.895 kg)  05/23/13 185 lb (83.915 kg)  06/01/13 148 lb (67.132 kg)  05/08/13 185 lb (83.915 kg)  05/07/13 190 lb (86.183 kg)  04/18/13 189 lb 12 oz (86.07 kg)  03/03/13 180 lb (81.647 kg)  12/13/12 180 lb (81.647 kg)    BMI:  Body mass index is 29.53 kg/(m^2). Pt meets criteria for overweight based on current BMI.  Estimated Nutritional Needs: Kcal: 25-30 kcal/kg Protein: > 1 gram protein/kg Fluid: 1 ml/kcal  Diet Order: General Pt is also offered choice of unit snacks mid-morning and mid-afternoon.  Pt is eating as desired.   Lab results and  medications reviewed.   Larey Seat, RD, LDN Pager #: [phone] After-Hours Pager #: [phone]

## 2013-09-23 NOTE — Progress Notes (Signed)
Patient ID: John [name], male   DOB: Aug 06, 1970, 43 y.o.   MRN: [id number]  D: Patient pleasant with staff and peers in milieu, but was intentionally provoked by a peer on unit which upset him. Pt needing to leave group early. Pt involved in a verbal altercation with said peer, but was able to walk away and successfully use coping skills to calm himself down. Pt was able to rest and was without further incident. A: Q 15 minute safety checks, encourage staff/peer interaction and group participation. Administer medications as ordered by MD. R: Pt compliant with medications. Pt denies SI or plans to harm himself.

## 2013-09-23 NOTE — Progress Notes (Signed)
Psychoeducational Group Note  Date: 09/23/2013 Time:  0930  Group Topic/Focus:  Gratefulness:  The focus of this group is to help patients identify what two things they are most grateful for in their lives. What helps ground them and to center them on their work to their recovery.  Participation Level:  Active  Participation Quality:  Appropriate  Affect:  Appropriate  Cognitive:  Oriented  Insight:  Improving  Engagement in Group:  Engaged  Additional Comments:    Paulino Rily

## 2013-09-23 NOTE — BHH Group Notes (Signed)
[facility] Group Notes: (Clinical Social Work)   09/23/2013      Type of Therapy:  Group Therapy   Participation Level:  Did Not Attend    Selmer Dominion, LCSW 09/23/2013, 4:04 PM

## 2013-09-23 NOTE — Progress Notes (Signed)
D) Pt has attended the groups and interacts with his peers appropriately. Affect is flat and mood depressed. Pt rates his depression and hopelessness both at a 6 and admits to thoughts of SI.  A) Given support, reassurance and praise along with encouragement. R) Contracts for safety within the hospital.

## 2013-09-23 NOTE — Progress Notes (Signed)
The patient attended group this evening, but was visibly upset by one of his peers and so he left to use the restroom. He had nothing to share about his day.

## 2013-09-23 NOTE — Progress Notes (Signed)
Psychoeducational Group Note  Date:  09/23/2013 Time:  1015  Group Topic/Focus:  Making Healthy Choices:   The focus of this group is to help patients identify negative/unhealthy choices they were using prior to admission and identify positive/healthier coping strategies to replace them upon discharge.  Participation Level:  Active  Participation Quality:  Appropriate  Affect:  Appropriate  Cognitive:  Oriented  Insight:  Improving  Engagement in Group:  Engaged  Additional Comments:    Paulino Rily 09/23/2013

## 2013-09-24 MED ORDER — TRAZODONE HCL 150 MG PO TABS
150.0000 mg | ORAL_TABLET | Freq: Every day | ORAL | Status: DC
  Administered 2013-09-24: 150 mg via ORAL
  Filled 2013-09-24 (×3): qty 1

## 2013-09-24 MED ORDER — SERTRALINE HCL 100 MG PO TABS
100.0000 mg | ORAL_TABLET | Freq: Every day | ORAL | Status: DC
  Administered 2013-09-25: 100 mg via ORAL
  Filled 2013-09-24 (×3): qty 1

## 2013-09-24 NOTE — Progress Notes (Signed)
Patient ID: John [name], male   DOB: 06/17/1970, 43 y.o.   MRN: [id number]  D: Pt. Denies HI and A/V Hallucinations. Patient reports SI but contracts for safety. Patient does not report any pain or discomfort at this time. Patient rates his depression and hopelessness at 8/10 for the day. Patient reports that his sleep was poor, energy level is low, and appetite is poor today.  A: Support and encouragement provided to the patient to go to groups and speak with Probation officer about any questions or concerns. Patient was prompted to fill out daily inventory sheet. Scheduled medications were administered to patient per physician's orders.  R: Patient is receptive and cooperative but minimal. Patient is spending majority of the day in his bed sleeping or resting. Q15 minute checks are maintained for safety.

## 2013-09-24 NOTE — Progress Notes (Signed)
Writer has observed patient up in the dayroom watching tv with minimal interaction with peers. He is complaint with his medications, attended group and participated. Patient reports that he had a good day but doesn't elaborate. Safety  Maintained on unit with 15 min checks.

## 2013-09-24 NOTE — BHH Group Notes (Signed)
[facility] LCSW Group Therapy          Overcoming Obstacles       1:15 -2:30        09/24/2013   3:07 PM     Type of Therapy:  Group Therapy  Participation Level:  Appropriate  Participation Quality:  Appropriate  Affect:  Appropriate, Alert  Cognitive:  Attentive Appropriate  Insight: Developing/Improving Engaged  Engagement in Therapy: Developing/Imprvoing Engaged  Modes of Intervention:  Discussion Exploration  Education Rapport BuildingProblem-Solving Support  Summary of Progress/Problems:  The main focus of today's group was overcoming obstacles.  Patient shared the obstacle he has to overcome is grief from the death of multiple family members recently.  Patient able to identify appropriate coping skills.   Concha Pyo 09/24/2013   3:07 PM

## 2013-09-24 NOTE — BHH Group Notes (Signed)
[facility] LCSW Aftercare Discharge Planning Group Note   09/24/2013 11:08 AM    Participation Quality:  Appropraite  Mood/Affect:  Appropriate  Depression Rating:  4  Anxiety Rating:  4  Thoughts of Suicide:  No  Will you contract for safety?   NA  Current AVH:  No  Plan for Discharge/Comments:  Patient attended discharge planning group and actively participated in group.   He advised of having a home and outpatient services with [facility].  CSW provided all participants with daily workbook.   Transportation Means: Patient has transportation.   Supports:  Patient has a support system.   Mahayla Haddaway, [employer]   Psy

## 2013-09-24 NOTE — Progress Notes (Signed)
Adult Psychoeducational Group Note  Date:  09/24/2013 Time:  8:30PM Group Topic/Focus:  Wrap-Up Group:   The focus of this group is to help patients review their daily goal of treatment and discuss progress on daily workbooks.  Participation Level:  Active  Participation Quality:  Appropriate and Attentive  Affect:  Appropriate  Cognitive:  Alert and Appropriate  Insight: Appropriate  Engagement in Group:  Engaged  Modes of Intervention:  Discussion  Additional Comments:  Pt. Was attentive and appropriate during tonight's group discussion. Pt stated that today was a good day and  That he is learning it not what you say its how you say it.   Theodoro Grist D 09/24/2013, 9:54 PM

## 2013-09-24 NOTE — Progress Notes (Signed)
Patient ID: John [name], male   DOB: 06/29/1970, 43 y.o.   MRN: [id number] [facility] MD Progress Note  09/24/2013 1:14 PM John [name]  MRN:  [id number]  Subjective:  Patient reports modest improvement , but states he still struggles with depression. Objective:  Patient reports depression following the death of brother and grandmother recently. Upon admission he reported suicidal ideations. He states he continues thinking about dying , but at this time denies any plan or intention of hurting self. He also reports recent auditory hallucinations. He is not endorsing medication side effects at the current time. ( At this time on Seroquel and on Zoloft ) . On unit patient has been calm, going to some groups, but has tended to remain passive and not very interactive.  Patient's UDS (+) for cocaine and Cannabis. Denies any knowledge of cocaine use- patient encouraged to maintain sobriety, reviewed negative impact substance abuse can have on mood.  Gout improved- patient states toe pain has subsided.  Diagnosis:   Major Depressive Disorder with psychotic features.  Cocaine abuse  Cannabis abuse    Total Time spent with patient: 20 minutes    ADL's:  Fair   Sleep: Fair   Appetite:  Fair   Suicidal Ideation:  Denies any suicidal ideations Homicidal Ideation:  At this time denies homicidal ideations  AEB (as evidenced by):  Psychiatric Specialty Exam: Physical Exam  Review of Systems  Constitutional: Positive for malaise/fatigue.  Psychiatric/Behavioral: Positive for depression, suicidal ideas and hallucinations. The patient is nervous/anxious.     Blood pressure 124/89, pulse 84, temperature 98 F (36.7 C), temperature source Oral, resp. rate 16, height 5' 7.5" (1.715 m), weight 86.864 kg (191 lb 8 oz), SpO2 97.00%.Body mass index is 29.53 kg/(m^2).  General Appearance: Fairly groomed   Engineer, water::  Good  Speech:  Clear and Coherent and Normal Rate  Volume:  Normal  Mood:   Depressed  Affect:  Constricted.   Thought Process:  Goal Directed  Orientation:  Full (Time, Place, and Person)  Thought Content:  He states he has been hearing voices - Telling him to hurt himself. Does not appear internally preoccupied or paranoid.  Suicidal Thoughts:  Passive thoughts of death/dying. Current denies any suicidal plan or intent.   Homicidal Thoughts:  At this time denies homicidal ideations  Memory:  NA  Judgement:  Fair  Insight:  Fair  Psychomotor Activity:  Normal  Concentration:  Good  Recall:  Good  Fund of Knowledge:Good  Language: Good  Akathisia:  No  Handed:  Right  AIMS (if indicated):     Assets:  Communication Skills Desire for Improvement  Sleep:  Number of Hours: 6.75   Musculoskeletal: Strength & Muscle Tone: within normal limits Gait & Station: normal Patient leans: N/A  Current Medications: Current Facility-Administered Medications  Medication Dose Route Frequency Provider Last Rate Last Dose  . acetaminophen (TYLENOL) tablet 650 mg  650 mg Oral Q6H PRN Shuvon Rankin, NP   650 mg at 09/21/13 1831  . albuterol (PROVENTIL HFA;VENTOLIN HFA) 108 (90 BASE) MCG/ACT inhaler 2 puff  2 puff Inhalation Q4H PRN Shuvon Rankin, NP   2 puff at 09/22/13 1027  . alum & mag hydroxide-simeth (MAALOX/MYLANTA) 200-200-20 MG/5ML suspension 30 mL  30 mL Oral Q4H PRN Shuvon Rankin, NP      . feeding supplement (ENSURE COMPLETE) (ENSURE COMPLETE) liquid 237 mL  237 mL Oral TID BM Elyse A Shearer, RD   237 mL at 09/24/13 0940  . hydrOXYzine (  ATARAX/VISTARIL) tablet 25 mg  25 mg Oral TID PRN Charlcie Cradle, MD   25 mg at 09/23/13 1300  . indomethacin (INDOCIN) capsule 25 mg  25 mg Oral BID WC Shuvon Rankin, NP   25 mg at 09/24/13 0859  . magnesium hydroxide (MILK OF MAGNESIA) suspension 30 mL  30 mL Oral Daily PRN Shuvon Rankin, NP      . QUEtiapine (SEROQUEL) tablet 300 mg  300 mg Oral QHS Shuvon Rankin, NP   300 mg at 09/23/13 2100  . sertraline (ZOLOFT) tablet 50 mg   50 mg Oral Daily Charlcie Cradle, MD   50 mg at 09/24/13 0859  . traZODone (DESYREL) tablet 100 mg  100 mg Oral QHS Shuvon Rankin, NP   100 mg at 09/23/13 2100    Lab Results: No results found for this or any previous visit (from the past 48 hour(s)).  Physical Findings: AIMS: Facial and Oral Movements Muscles of Facial Expression: Severe Lips and Perioral Area: Severe Jaw: Severe Tongue: Severe,Extremity Movements Upper (arms, wrists, hands, fingers): Severe Lower (legs, knees, ankles, toes): Severe, Trunk Movements Neck, shoulders, hips: Severe, Overall Severity Severity of abnormal movements (highest score from questions above): Severe Incapacitation due to abnormal movements: Severe Patient's awareness of abnormal movements (rate only patient's report): Aware, severe distress, Dental Status Current problems with teeth and/or dentures?: No Does patient usually wear dentures?: No  CIWA:  CIWA-Ar Total: 1 COWS:     Assessment:  Patient remains depressed, sad, and somewhat isolative. He describes ongoing passive thoughts of death, but at this time not actively suicidal. Reports recent auditory halls. On Zoloft and Seroquel. No side effects.  Treatment Plan Summary: Daily contact with patient to assess and evaluate symptoms and progress in treatment Medication management  Plan:   Continue Seroquel 300mg  po qD Increase Zoloft to 100 mg po qD Increase Trazodone to 150mg  po qHS to address residual insomnia   Medical Decision Making Problem Points:  Established problem, stable/improving (1) and New problem, with no additional work-up planned (3) Data Points:  Review of medication regiment & side effects (2)  I certify that inpatient services furnished can reasonably be expected to improve the patient's condition.   Kent Riendeau 09/24/2013, 1:14 PM

## 2013-09-24 NOTE — BHH Suicide Risk Assessment (Signed)
[facility] INPATIENT:  Family/Significant Other Suicide Prevention Education  Suicide Prevention Education:  Patient Refusal for Family/Significant Other Suicide Prevention Education: The patient John [name] has refused to provide written consent for family/significant other to be provided Family/Significant Other Suicide Prevention Education during admission and/or prior to discharge.  Physician notified.  Concha Pyo 09/24/2013, 12:54 PM

## 2013-09-25 DIAGNOSIS — F1410 Cocaine abuse, uncomplicated: Secondary | ICD-10-CM

## 2013-09-25 DIAGNOSIS — F323 Major depressive disorder, single episode, severe with psychotic features: Secondary | ICD-10-CM

## 2013-09-25 MED ORDER — TRAZODONE HCL 100 MG PO TABS
100.0000 mg | ORAL_TABLET | Freq: Every day | ORAL | Status: DC
  Filled 2013-09-25: qty 1

## 2013-09-25 MED ORDER — TRAZODONE HCL 100 MG PO TABS: 100.0000 mg | ORAL_TABLET | Freq: Every evening | ORAL | Status: DC | PRN

## 2013-09-25 MED ORDER — MIRTAZAPINE 15 MG PO TABS
15.0000 mg | ORAL_TABLET | Freq: Every day | ORAL | Status: DC
  Administered 2013-09-25: 15 mg via ORAL
  Filled 2013-09-25 (×4): qty 1

## 2013-09-25 NOTE — Progress Notes (Signed)
Adult Psychoeducational Group Note  Date:  09/25/2013 Time:  10:18 PM  Group Topic/Focus:  Wrap-Up Group:   The focus of this group is to help patients review their daily goal of treatment and discuss progress on daily workbooks.  Participation Level:  Active  Participation Quality:  Appropriate and Attentive  Affect:  Appropriate  Cognitive:  Alert and Appropriate  Insight: Good  Engagement in Group:  Engaged  Modes of Intervention:  Activity  Additional Comments:  Pt was in group but didn't have much to say... He said that he was ready to go but was not rude about it   Reinhold Rickey R 09/25/2013, 10:18 PM

## 2013-09-25 NOTE — Progress Notes (Signed)
D: Patient denies SI/HI and A/V hallucinations; patient reports sleep is well; reports appetite is poor; reports energy level is normal ; reports ability to pay attention is good; rates depression as 8/10; rates hopelessness 8/10; rates anxiety as 6/10; patient reports no pain; patient reports that he needs to stay on his medication  A: Monitored q 15 minutes; patient encouraged to attend groups; patient educated about medications; patient given medications per physician orders; patient encouraged to express feelings and/or concerns  R: Patient was in the bed all morning; patient was sleeping all day; patient forwards little information ; patient's interaction with staff and peers is appropriate but minimal; patient was able to set goal to talk with staff 1:1 when having feelings of SI; patient is taking medications as prescribed and tolerating medications

## 2013-09-25 NOTE — Progress Notes (Signed)
Recreation Therapy Notes  Animal-Assisted Activity/Therapy (AAA/T) Program Checklist/Progress Notes Patient Eligibility Criteria Checklist & Daily Group note for Rec Tx Intervention  Date: 07.14.2015 Time: 3:15pm Location: [address]   AAA/T Program Assumption of Risk Form signed by Patient/ or Parent Legal Guardian yes  Patient is free of allergies or sever asthma yes  Patient reports no fear of animals yes  Patient reports no history of cruelty to animals yes   Patient understands his/her participation is voluntary yes  Behavioral Response: Did not attend. Patient declined AAA services at admission.   Laureen Ochs Jestin Burbach, LRT/CTRS   Jayin Derousse L 09/25/2013 5:09 PM

## 2013-09-25 NOTE — Progress Notes (Signed)
Patient ID: John [name], male   DOB: 12-02-1970, 43 y.o.   MRN: [id number] [facility] MD Progress Note  09/25/2013 5:22 PM Koren Sermersheim  MRN:  [id number]  Subjective:  States " I feel about the same" Objective:  Although reports ongoing depression, sadness , and ruminations about recent loss of loved ones, his affect is improved today- he has better eye contact, and is more communicative/conversant. He has been going to some groups,but  he tends to remain somewhat passive  And he has continued to be somewhat isolative in his room.  No disruptive behaviors on unit At this time he is tolerating medications well, no side effects reported.  On Seroquel and Zoloft .  We discussed importance of maintaining sobriety as integral part of treatment team and the likelihood that drug use could negatively impact both mental and physical health. Also reviewed the importance of following up with outpatient psychiatric, medical and Infectious Disease treatment after discharge. ( Patient states he is HIV positive, but denies any history of AIDS, states he has an ID Specialist, and that they have discussed when to  start antiretrovirals )   Gout improved- patient states toe pain has subsided.  Diagnosis:   Major Depressive Disorder with psychotic features.  Cocaine abuse  Cannabis abuse    Total Time spent with patient: 20 minutes    ADL's:  Fair   Sleep: Fair   Appetite:  Fair   Suicidal Ideation:  Denies any suicidal ideations Homicidal Ideation:  At this time denies homicidal ideations  AEB (as evidenced by):  Psychiatric Specialty Exam: Physical Exam  Review of Systems  Constitutional: Positive for malaise/fatigue.  Psychiatric/Behavioral: Positive for depression, suicidal ideas and hallucinations. The patient is nervous/anxious.     Blood pressure 132/83, pulse 93, temperature 97.7 F (36.5 C), temperature source Oral, resp. rate 18, height 5' 7.5" (1.715 m), weight 86.864 kg (191 lb 8  oz), SpO2 97.00%.Body mass index is 29.53 kg/(m^2).  General Appearance: Fairly groomed   Engineer, water::  Good  Speech:  Clear and Coherent and Normal Rate  Volume:  Normal  Mood:  Depressed  Affect:  Constricted.   Thought Process:  Goal Directed  Orientation:  Full (Time, Place, and Person)  Thought Content:  At this time no hallucinations- does not appear internally preoccupied or paranoid, bizarre  Suicidal Thoughts: Continues to report thoughts of being better off dead, but contracts for safety on the unit and denies any current suicidal plan or intent  Homicidal Thoughts:  At this time denies homicidal ideations  Memory:  NA  Judgement:  Fair  Insight:  Fair  Psychomotor Activity:  Normal  Concentration:  Good  Recall:  Good  Fund of Knowledge:Good  Language: Good  Akathisia:  No  Handed:  Right  AIMS (if indicated):     Assets:  Communication Skills Desire for Improvement  Sleep:  Number of Hours: 6.75   Musculoskeletal: Strength & Muscle Tone: within normal limits Gait & Station: normal Patient leans: N/A  Current Medications: Current Facility-Administered Medications  Medication Dose Route Frequency Provider Last Rate Last Dose  . acetaminophen (TYLENOL) tablet 650 mg  650 mg Oral Q6H PRN Shuvon Rankin, NP   650 mg at 09/21/13 1831  . albuterol (PROVENTIL HFA;VENTOLIN HFA) 108 (90 BASE) MCG/ACT inhaler 2 puff  2 puff Inhalation Q4H PRN Shuvon Rankin, NP   2 puff at 09/24/13 2114  . alum & mag hydroxide-simeth (MAALOX/MYLANTA) 200-200-20 MG/5ML suspension 30 mL  30 mL Oral Q4H  PRN Shuvon Rankin, NP      . feeding supplement (ENSURE COMPLETE) (ENSURE COMPLETE) liquid 237 mL  237 mL Oral TID BM Elyse A Shearer, RD   237 mL at 09/25/13 1447  . hydrOXYzine (ATARAX/VISTARIL) tablet 25 mg  25 mg Oral TID PRN Charlcie Cradle, MD   25 mg at 09/23/13 1300  . indomethacin (INDOCIN) capsule 25 mg  25 mg Oral BID WC Shuvon Rankin, NP   25 mg at 09/25/13 1631  . magnesium hydroxide  (MILK OF MAGNESIA) suspension 30 mL  30 mL Oral Daily PRN Shuvon Rankin, NP      . mirtazapine (REMERON) tablet 15 mg  15 mg Oral QHS Neita Garnet, MD      . QUEtiapine (SEROQUEL) tablet 300 mg  300 mg Oral QHS Shuvon Rankin, NP   300 mg at 09/24/13 2111  . traZODone (DESYREL) tablet 100 mg  100 mg Oral QHS Neita Garnet, MD        Lab Results: No results found for this or any previous visit (from the past 48 hour(s)).  Physical Findings: AIMS: Facial and Oral Movements Muscles of Facial Expression: Severe Lips and Perioral Area: Severe Jaw: Severe Tongue: Severe,Extremity Movements Upper (arms, wrists, hands, fingers): Severe Lower (legs, knees, ankles, toes): Severe, Trunk Movements Neck, shoulders, hips: Severe, Overall Severity Severity of abnormal movements (highest score from questions above): Severe Incapacitation due to abnormal movements: Severe Patient's awareness of abnormal movements (rate only patient's report): Aware, severe distress, Dental Status Current problems with teeth and/or dentures?: No Does patient usually wear dentures?: No  CIWA:  CIWA-Ar Total: 1 COWS:     Assessment: Currently patient remains depressed , but seems partially improved, with a somewhat improved affect . Today does not report auditory hallucinations.  Tolerating medications well.  We discussed medications issues. Of note, patient describes weight loss and limited appetite and insomnia, and wanted a medication to help address these. We discussed options and he agrees to [place] as an antidepressant, rather than Zoloft , which he was started on a few days ago.  Treatment Plan Summary: Daily contact with patient to assess and evaluate symptoms and progress in treatment Medication management  Plan:   Continue Seroquel 300mg  po qD D/C Zoloft Start Remeron 15 mgrs QHS initially- we reviewed side effects and rationale. Trazodone to 100mg  po qHS for insomnia if needed    Medical Decision  Making Problem Points:  Established problem, stable/improving (1) Data Points:  Review of medication regiment & side effects (2)  I certify that inpatient services furnished can reasonably be expected to improve the patient's condition.   Jamere Stidham 09/25/2013, 5:22 PM

## 2013-09-25 NOTE — Tx Team (Signed)
Interdisciplinary Treatment Plan Update   Date Reviewed:  09/25/2013  Time Reviewed:  8:38 AM  Progress in Treatment:   Attending groups: Yes Participating in groups: Yes Taking medication as prescribed: Yes  Tolerating medication: Yes Family/Significant other contact made:  No, patient declined collateral contact Patient understands diagnosis: Yes  Discussing patient identified problems/goals with staff: Yes Medical problems stabilized or resolved: Yes Denies suicidal/homicidal ideation: Yes Patient has not harmed self or others: Yes  For review of initial/current patient goals, please see plan of care.  Estimated Length of Stay:  3-4 days  Reasons for Continued Hospitalization:  Anxiety Depression Medication stabilization Suicidal ideation  New Problems/Goals identified:    Discharge Plan or Barriers:   Home with outpatient follow up with [facility]  Additional Comments:  John [name] is a 43 year old male who initially presented to the [facility] for complaints of gout related pain. Upon discharge the patient informed ED staff of his desire to jump from a bridge. Patient reported having run out of medications due to not having transportation to [place]. John [name] is currently homeless as he is staying with friends. Patient states today during his psychiatric assessment "The voices started a week ago. They tell me to jump off a bridge. That it would be better for me to die. I am depressed. I have had several recent losses. My brother died in a drive by shooting and my grandmother in a car accident. I have nobody left that I really care about. I just want to be with them. I've been off medications for three to four months. I might have handled all this better if I had been able to take them. I have been homeless for years. I have HIV. I have never taken any medications for it. I am supposed to see the ID Doctor later this month. They will let me know if I need to start taking them or not."  John [name] becomes very tearful during the interview when discussing his numerous stressors. He appears very depressed. Rates his depression at seven. Patient reports that he continues to experience auditory hallucinations and to feel suicidal. Patient admits to marijuana use to cope with his feelings. He denies cocaine reporting that the marijuana must have been laced with it.    Attendees:  Patient:  09/25/2013 8:38 AM   Signature:  Gabriel Earing, MD 09/25/2013 8:38 AM  Signature:  09/25/2013 8:38 AM  Signature:  09/25/2013 8:38 AM  Signature:  Grayland Ormond, RN 09/25/2013 8:38 AM  Signature:  Thurnell Garbe RN 09/25/2013 8:38 AM  Signature:  Joette Catching, LCSW 09/25/2013 8:38 AM  Signature:  Regan Lemming, LCSW 09/25/2013 8:38 AM  Signature:  09/25/2013 8:38 AM  Signature: 09/25/2013 8:38 AM  Signature: Marilynne Halsted, RN 09/25/2013  8:38 AM  Signature:  09/25/2013  8:38 AM  Signature: 09/25/2013  8:38 AM    Scribe for Treatment Team:   Joette Catching,  09/25/2013 8:38 AM

## 2013-09-25 NOTE — Progress Notes (Signed)
The focus of this group is to educate the patient on the purpose and policies of crisis stabilization and provide a format to answer questions about their admission.  The group details unit policies and expectations of patients while admitted. Patient did not attend this group.

## 2013-09-25 NOTE — BHH Group Notes (Addendum)
[place] [facility] LCSW Group Therapy  Feelings Around Diagnosis 1:15 - 2: 30 PM        09/25/2013     Type of Therapy:  Group Therapy  Participation Level:  Minimal  Participation Quality:  Appropriate  Affect:  Depressed, Flat  Cognitive:  Attentive Appropriate  Insight:  Developing/Improving Engaged  Engagement in Therapy:  Developing/Improving Engaged  Modes of Intervention:  Discussion Exploration Problem-Solving Supportive  Summary of Progress/Problems:  Group topic was feelings around diagnosis.  Patient remained in group but did not engage in the discussion.  Concha Pyo 09/25/2013

## 2013-09-25 NOTE — Progress Notes (Signed)
Patient ID: Christoher Drudge, male   DOB: 1970/11/18, 43 y.o.   MRN: [id number] D: Pt upset earlier this evening because of augument with another pt. Pt able to walk away before it excalated. Pt attended evening wrap up group and Interacted appropriately with peers. Pt denies SI/HI/AVH and pain. No acute distressed noted at this time.   A: Met with pt 1:1. Medications administered as prescribed. Writer encouraged pt to discuss feelings. Writer encourage pt to use coping skills.  Pt encouraged to come to staff with any question or concerns.   R: Patient remains safe. He is complaint with medications and denies any adverse reaction. Continue current POC.

## 2013-09-26 MED ORDER — QUETIAPINE FUMARATE 400 MG PO TABS
400.0000 mg | ORAL_TABLET | Freq: Every day | ORAL | Status: DC
  Administered 2013-09-26 – 2013-09-27 (×2): 400 mg via ORAL
  Filled 2013-09-26 (×3): qty 1
  Filled 2013-09-26: qty 2
  Filled 2013-09-26: qty 1

## 2013-09-26 MED ORDER — TRAZODONE HCL 50 MG PO TABS
50.0000 mg | ORAL_TABLET | Freq: Every evening | ORAL | Status: DC | PRN
  Administered 2013-09-27: 50 mg via ORAL
  Filled 2013-09-26: qty 14

## 2013-09-26 MED ORDER — MIRTAZAPINE 30 MG PO TABS
30.0000 mg | ORAL_TABLET | Freq: Every day | ORAL | Status: DC
  Administered 2013-09-26 – 2013-09-27 (×2): 30 mg via ORAL
  Filled 2013-09-26: qty 1
  Filled 2013-09-26: qty 2
  Filled 2013-09-26 (×3): qty 1

## 2013-09-26 NOTE — Progress Notes (Signed)
Patient ID: John [name], male   DOB: [birth year], 43 y.o.   MRN: [id number] [facility] MD Progress Note  09/26/2013 3:47 PM John [name]  MRN:  [id number]  Subjective:  Continues to feel depressed and continues to report some suicidal thoughts, although without current plan or intention.  Objective:  Patient remains depressed and he continues to endorse significant depression,and is grieving the loss of his family members, particularly grandmother, whom he states he was very close to , as she helped raise him. At this time he is tolerating medications well, no side effects reported. He was recently switched from Zoloft to Remeron , and did sleep better.  He has been going to groups , and has been visible on unit, and behavior has been in control. A[ppetite also reported as at least partially improved ( patient reports significant weight loss prior to admission) , which may also be related to remeron trial. Denies cravings. Last night had episode of abdominal pain, now resolved. At this time does not appear to be in any acute distress or discomfort.   Diagnosis:   Major Depressive Disorder with psychotic features.  Cocaine abuse  Cannabis abuse    Total Time spent with patient: 20 minutes    ADL's:  Fair   Sleep: Fair   Appetite:  Fair   Suicidal Ideation:  Denies any suicidal ideations Homicidal Ideation:  At this time denies homicidal ideations  AEB (as evidenced by):  Psychiatric Specialty Exam: Physical Exam  Review of Systems  Constitutional: Positive for malaise/fatigue.  Psychiatric/Behavioral: Positive for depression, suicidal ideas and hallucinations. The patient is nervous/anxious.     Blood pressure 132/78, pulse 86, temperature 98.2 F (36.8 C), temperature source Oral, resp. rate 18, height 5' 7.5" (1.715 m), weight 86.864 kg (191 lb 8 oz), SpO2 97.00%.Body mass index is 29.53 kg/(m^2).  General Appearance: Fairly groomed   Engineer, water::  Good  Speech:  Clear and  Coherent and Normal Rate  Volume:  Normal  Mood:  Remains depressed, sad   Affect:  Constricted affect  Thought Process:  Goal Directed  Orientation:  Full (Time, Place, and Person)  Thought Content:  At this time no hallucinations- does not appear internally preoccupied or paranoid, bizarre  Suicidal Thoughts:Ongoing thoughts of death and wanting to be with deceased relatives, denies plan or intention of hurting self , contracts for safety on unit.  Homicidal Thoughts:  At this time denies homicidal ideations  Memory:  NA  Judgement:  Fair  Insight:  Fair  Psychomotor Activity:  Normal  Concentration:  Good  Recall:  Good  Fund of Knowledge:Good  Language: Good  Akathisia:  No  Handed:  Right  AIMS (if indicated):     Assets:  Communication Skills Desire for Improvement  Sleep:  Number of Hours: 6.5   Musculoskeletal: Strength & Muscle Tone: within normal limits Gait & Station: normal Patient leans: N/A  Current Medications: Current Facility-Administered Medications  Medication Dose Route Frequency Provider Last Rate Last Dose  . acetaminophen (TYLENOL) tablet 650 mg  650 mg Oral Q6H PRN Shuvon Rankin, NP   650 mg at 09/21/13 1831  . albuterol (PROVENTIL HFA;VENTOLIN HFA) 108 (90 BASE) MCG/ACT inhaler 2 puff  2 puff Inhalation Q4H PRN Shuvon Rankin, NP   2 puff at 09/24/13 2114  . alum & mag hydroxide-simeth (MAALOX/MYLANTA) 200-200-20 MG/5ML suspension 30 mL  30 mL Oral Q4H PRN Shuvon Rankin, NP      . feeding supplement (ENSURE COMPLETE) (ENSURE COMPLETE) liquid 237  mL  237 mL Oral TID BM Elyse A Shearer, RD   237 mL at 09/25/13 1942  . hydrOXYzine (ATARAX/VISTARIL) tablet 25 mg  25 mg Oral TID PRN Charlcie Cradle, MD   25 mg at 09/23/13 1300  . indomethacin (INDOCIN) capsule 25 mg  25 mg Oral BID WC Shuvon Rankin, NP   25 mg at 09/26/13 0813  . magnesium hydroxide (MILK OF MAGNESIA) suspension 30 mL  30 mL Oral Daily PRN Shuvon Rankin, NP      . mirtazapine (REMERON) tablet  15 mg  15 mg Oral QHS Neita Garnet, MD   15 mg at 09/25/13 2104  . QUEtiapine (SEROQUEL) tablet 300 mg  300 mg Oral QHS Shuvon Rankin, NP   300 mg at 09/25/13 2104  . traZODone (DESYREL) tablet 100 mg  100 mg Oral QHS PRN Neita Garnet, MD        Lab Results: No results found for this or any previous visit (from the past 48 hour(s)).  Physical Findings: AIMS: Facial and Oral Movements Muscles of Facial Expression: Severe Lips and Perioral Area: Severe Jaw: Severe Tongue: Severe,Extremity Movements Upper (arms, wrists, hands, fingers): Severe Lower (legs, knees, ankles, toes): Severe, Trunk Movements Neck, shoulders, hips: Severe, Overall Severity Severity of abnormal movements (highest score from questions above): Severe Incapacitation due to abnormal movements: Severe Patient's awareness of abnormal movements (rate only patient's report): Aware, severe distress, Dental Status Current problems with teeth and/or dentures?: No Does patient usually wear dentures?: No  CIWA:  CIWA-Ar Total: 1 COWS:     Assessment: Remains depressed, sad , and continues to describe passive thoughts of death, although contracts for safety. Tolerating REMERON and SEROQUEL well thus far, and some improvement in sleep, appetite, and no ongoing psychotic symptoms Treatment Plan Summary: Daily contact with patient to assess and evaluate symptoms and progress in treatment Medication management  Plan:  Continue inpatient treatment and support/milieu Increase  Seroquel  To 400mg  po qD Increase  Remeron to 30  mgrs QHS initially- we reviewed side effects and rationale. Decrease Trazodone to 50mg  po qHS for insomnia if needed    Medical Decision Making Problem Points:  Established problem, stable/improving (1) Data Points:  Review of medication regiment & side effects (2)  I certify that inpatient services furnished can reasonably be expected to improve the patient's condition.   COBOS,  [place] 09/26/2013, 3:47 PM

## 2013-09-26 NOTE — BHH Group Notes (Signed)
[facility] LCSW Aftercare Discharge Planning Group Note   09/26/2013 10:30 AM    Participation Quality:  Appropraite  Mood/Affect:  Appropriate  Depression Rating:  10  Anxiety Rating:  8  Thoughts of Suicide:  Yes  Will you contract for safety?  Yes  Current AVH:  No  Plan for Discharge/Comments:  Patient attended discharge planning group and actively participated in group.  He reports being very depressed and in a dark hole and unable to work his way out.  Patient is followed by [facility] for outpatient servcies.  CSW provided all participants with daily workbook.   Transportation Means: Patient has transportation.   Supports:  Patient has a limited support system.   Sohrab Keelan, Eulas Post

## 2013-09-26 NOTE — Clinical Social Work Note (Addendum)
CSW met with patient who reports being extremely depressed and SI. He reports having thoughts of taking a plastic spoon from the cafeteria and using it to cut himself but advised he is able to contract for safety.   Patient advised of feeling hopeless due to everyone, friends and family, except for on aunt having died.  He stated the death of his grandmother and cousin less that two weeks ago is more than he can bear.  Patient agreeable to having the chaplin meet with him.  Chaplin notified by e-mail.

## 2013-09-26 NOTE — Progress Notes (Signed)
D: Patient denies HI and A/V hallucinations; patient reports sleep is good and reports that he did not request  sleep medication; reports appetite is poor; reports energy level is low  ; reports his concentration is poor; rates depression as 10/10; rates hopelessness 9/10; rates anxiety as 8/10; patient reports he is having thoughts almost all the time of suicide or hurting himself  A: Monitored q 15 minutes; patient encouraged to attend groups; patient educated about medications; patient given medications per physician orders; patient encouraged to express feelings and/or concerns  R: Patient is cooperative and pleasant; patient appears to be sad and flat; patient has been on the [place] today engaging with his peers; patient's interaction with staff and peers is appropriate .; patient was able to set goal to talk with staff 1:1 when having feelings of SI; patient is taking medications as prescribed and tolerating medications; patient is attending all groups

## 2013-09-26 NOTE — Progress Notes (Signed)
Did not attend group 

## 2013-09-26 NOTE — BHH Group Notes (Signed)
[facility] LCSW Group Therapy  Emotional Regulation 1:15 - 2: 30 PM        09/26/2013     Type of Therapy:  Group Therapy  Participation Level:  Minimal    Participation Quality:  Appropriate  Affect:Depressed, Flat, Tremulous  Cognitive:  AAppropriate  Insight:  Developing/Improving  Engagement in Therapy:  Developing/Improving  Modes of Intervention:  Discussion Exploration Problem-Solving Supportive  Summary of Progress/Problems:  Group topic was emotional regulations.  Patient attended group but did not participate.  He sat through group with legs trembling but would not engage in discussion. Concha Pyo 09/26/2013

## 2013-09-26 NOTE — Progress Notes (Signed)
D: pt c/o  abdominal pain. Pt rates pain 8/10. Pt stated the pain is more of a discomfort. Pt is having passive si pt contracts for safety. Pt stated, " i want to be with my grandmother, we were very close, and without her here, i feel like i don't have a reason to live. There is nothing here for me." denies hi/avh. Pt has a sad facial expression. Blunted/ depressed affect. A: scheduled medications given. Support and encouragement offered. q15 min safety checks R:pt remains safe on unit. No self injurious behaviors seen.

## 2013-09-27 NOTE — Progress Notes (Signed)
D:  Patient's self inventory sheet, patient sleeps good, no sleep medication given.  Poor appetite, low energy level, poor concentration.  Rated depression, hopeless and anxiety 8.  Denied withdrawals.  Denied withdrawals.  SI, contracts for safety, plan to cut wrists.  SI almost all of the time.  Physical problems, headaches, worst pain 8.  Takes pain medication.  Plans to discharge to [facility].  Needs financial assistance with medications. A:  Medications administered per MD orders.  Emotional support and encouragement given patient. R:  SI, contracts for safety, plan to cut wrists.  Denied HI.  Denied A/V hallucinations.  Safety maintained with 15 mintue checks. Patient stayed in bed and would not attend groups this morning.

## 2013-09-27 NOTE — BHH Group Notes (Signed)
[place] LCSW Group Therapy  [facility] 1:15 - 2:30 PM  09/27/2013   Type of Therapy:  Group Therapy  Participation Level: Minimal  Participation Quality:  Minimal  Affect: Depressed  Cognitive:  Appropriate  Insight:  Developing/Improving  Engagement in Therapy:  Developing/Improving  Modes of Intervention:  Discussion, Education, Exploration, Problem-Solving, Rapport Building, Support   Summary of Progress/Problems:  Patient attend group but made no comments or acknowledgement of presentation.  Concha Pyo 09/27/2013

## 2013-09-27 NOTE — Progress Notes (Signed)
Patient ID: John [name], male   DOB: [birth year], 43 y.o.   MRN: [id number] [facility] MD Progress Note  09/27/2013 4:44 PM John [name]  MRN:  [id number]  Subjective:  States he is feeling better, and that he is " starting to want to be discharged"  Objective:  He states he is feeling better. He is more focused on disposition plans and states he plans to go to  [facility], and plans to follow up at [facility]. He has been going to  some groups , and has been visible on unit,interacting with selective peers. He does tend to be passive in groups with limited participation. 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  Appetite and sleep are "OK". We discussed importance of abstaining from cocaine/cannabis, and encouraged him to return to [place] or NA, which he has attended in the past and which he feels helps him. Less somatic, states headache has resolved, and abdominal discomfort has also resolved.    Diagnosis:   Major Depressive Disorder with psychotic features.  Cocaine abuse  Cannabis abuse    Total Time spent with patient: 20 minutes    ADL's:  Fair   Sleep: better    Appetite:  Better    Suicidal Ideation:  Denies any suicidal ideations Homicidal Ideation:  At this time denies homicidal ideations  AEB (as evidenced by):  Psychiatric Specialty Exam: Physical Exam  Review of Systems  Constitutional: Positive for malaise/fatigue.  Psychiatric/Behavioral: Positive for depression. Negative for suicidal ideas and hallucinations. The patient is not nervous/anxious.     Blood pressure 122/96, pulse 96, temperature 98.1 F (36.7 C), temperature source Oral, resp. rate 20, height 5' 7.5" (1.715 m), weight 86.864 kg (191 lb 8 oz), SpO2 97.00%.Body mass index is 29.53 kg/(m^2).  General Appearance: Fairly groomed   Engineer, water::  Good  Speech:  Clear and Coherent and Normal Rate  Volume:  Normal  Mood:  Mood is improving   Affect:  More reactive    Thought Process:  Goal Directed  Orientation:  Full (Time, Place, and Person)  Thought Content:  At this time no hallucinations- does not appear internally preoccupied or paranoid, bizarre  Suicidal Thoughts:At this time denies any suicidal ideations , is future oriented.  Homicidal Thoughts:  At this time denies homicidal ideations  Memory:  NA  Judgement:  Fair  Insight:  Fair  Psychomotor Activity:  Normal  Concentration:  Good  Recall:  Good  Fund of Knowledge:Good  Language: Good  Akathisia:  No  Handed:  Right  AIMS (if indicated):     Assets:  Communication Skills Desire for Improvement  Sleep:  Number of Hours: 6   Musculoskeletal: Strength & Muscle Tone: within normal limits Gait & Station: normal Patient leans: N/A  Current Medications: Current Facility-Administered Medications  Medication Dose Route Frequency Provider Last Rate Last Dose  . acetaminophen (TYLENOL) tablet 650 mg  650 mg Oral Q6H PRN Shuvon Rankin, NP   650 mg at 09/27/13 0830  . albuterol (PROVENTIL HFA;VENTOLIN HFA) 108 (90 BASE) MCG/ACT inhaler 2 puff  2 puff Inhalation Q4H PRN Shuvon Rankin, NP   2 puff at 09/27/13 1523  . alum & mag hydroxide-simeth (MAALOX/MYLANTA) 200-200-20 MG/5ML suspension 30 mL  30 mL Oral Q4H PRN Shuvon Rankin, NP      . feeding supplement (ENSURE COMPLETE) (ENSURE COMPLETE) liquid 237 mL  237 mL Oral TID BM Elyse A Shearer, RD   237 mL at 09/27/13 1424  .  hydrOXYzine (ATARAX/VISTARIL) tablet 25 mg  25 mg Oral TID PRN Charlcie Cradle, MD   25 mg at 09/23/13 1300  . indomethacin (INDOCIN) capsule 25 mg  25 mg Oral BID WC Shuvon Rankin, NP   25 mg at 09/27/13 0827  . magnesium hydroxide (MILK OF MAGNESIA) suspension 30 mL  30 mL Oral Daily PRN Shuvon Rankin, NP      . mirtazapine (REMERON) tablet 30 mg  30 mg Oral QHS Neita Garnet, MD   30 mg at 09/26/13 2102  . QUEtiapine (SEROQUEL) tablet 400 mg  400 mg Oral QHS Neita Garnet, MD   400 mg at 09/26/13 2103  . traZODone  (DESYREL) tablet 50 mg  50 mg Oral QHS PRN Neita Garnet, MD        Lab Results: No results found for this or any previous visit (from the past 48 hour(s)).  Physical Findings: AIMS: Facial and Oral Movements Muscles of Facial Expression: None, normal Lips and Perioral Area: None, normal Jaw: None, normal Tongue: None, normal,Extremity Movements Upper (arms, wrists, hands, fingers): None, normal Lower (legs, knees, ankles, toes): None, normal, Trunk Movements Neck, shoulders, hips: None, normal, Overall Severity Severity of abnormal movements (highest score from questions above): None, normal Incapacitation due to abnormal movements: None, normal Patient's awareness of abnormal movements (rate only patient's report): No Awareness, Dental Status Current problems with teeth and/or dentures?: No Does patient usually wear dentures?: No  CIWA:  CIWA-Ar Total: 2 COWS:  COWS Total Score: 2  Assessment: Mood and affect are improved and he states he is feeling ready for discharge soon and today focuses on discharge planning. Tolerating medications well. States Remeron and Seroquel are working well and denies side effects. No SI at this time. Denies any psychotic symptoms, and his behavior is in good control. Treatment Plan Summary: Daily contact with patient to assess and evaluate symptoms and progress in treatment Medication management  Plan:  Continue inpatient treatment and support/milieu Seroquel  To 400mg  po qD Remeron to 30  mgrs QHS initially Trazodone to 50mg  po qHS for insomnia if needed  Treatment team to work on disposition planning . Patient wants to go to [facility] and to follow up at [facility], where he has followed before.   Medical Decision Making Problem Points:  Established problem, stable/improving (1) Data Points:  Review of medication regiment & side effects (2)  I certify that inpatient services furnished can reasonably be expected to improve the patient's  condition.   John [name] 09/27/2013, 4:44 PM

## 2013-09-27 NOTE — Progress Notes (Signed)
D: Pt denies SI/HI/AVH. Pt is pleasant and cooperative. Pt stated he was doing good, ready to go to [facility] out pt.   A: Pt was offered support and encouragement. Pt was given scheduled medications. Pt was encourage to attend groups. Q 15 minute checks were done for safety.   R:Pt attends groups and interacts well with peers and staff. Pt is taking medication. Pt has no complaints at this time.Pt receptive to treatment and safety maintained on unit.

## 2013-09-27 NOTE — Progress Notes (Signed)
The focus of this group is to educate the patient on the purpose and policies of crisis stabilization and provide a format to answer questions about their admission.  The group details unit policies and expectations of patients while admitted.  Patient did not attend morning nurse education orientation group.  Patient stayed in bed sleeping.

## 2013-09-27 NOTE — Progress Notes (Signed)
Patient ID: John [name], male   DOB: 02/11/1971, 43 y.o.   MRN: [id number] D: Patient in dayroom on approach. Pt mood and affect appeared depressed and blunted. Pt rated depression as 9 on a 0-10 scales. Pt endorses passive SI without a plan. Pt verbally contract for safety. Pt denies HI/AVH and pain.  Cooperative with assessment. No acute distressed noted at this time.   A: Met with pt 1:1. Medications administered as prescribed. Writer encouraged pt to discuss feelings. Pt encouraged to come to staff with any question or concerns.   R: Patient remains safe. He is complaint with medications and denies any adverse reaction. Continue current POC.

## 2013-09-28 DIAGNOSIS — F3289 Other specified depressive episodes: Secondary | ICD-10-CM

## 2013-09-28 DIAGNOSIS — F329 Major depressive disorder, single episode, unspecified: Secondary | ICD-10-CM | POA: Diagnosis present

## 2013-09-28 MED ORDER — QUETIAPINE FUMARATE 400 MG PO TABS: 400.0000 mg | ORAL_TABLET | Freq: Every day | ORAL | Status: DC

## 2013-09-28 MED ORDER — TRAZODONE HCL 50 MG PO TABS: 50.0000 mg | ORAL_TABLET | Freq: Every evening | ORAL | Status: DC | PRN

## 2013-09-28 MED ORDER — MIRTAZAPINE 30 MG PO TABS: 30.0000 mg | ORAL_TABLET | Freq: Every day | ORAL | Status: DC

## 2013-09-28 MED ORDER — INDOMETHACIN 25 MG PO CAPS: 25.0000 mg | ORAL_CAPSULE | Freq: Two times a day (BID) | ORAL | Status: DC

## 2013-09-28 NOTE — Progress Notes (Signed)
[facility] Adult Case Management Discharge Plan :  Will you be returning to the same living situation after discharge: Yes,  Patient will be living with family. At discharge, do you have transportation home?:Yes,  Patient assisted with bus pass. Do you have the ability to pay for your medications:No.  Patient needs assistance with indigent medications   Release of information consent forms completed and in the chart;  Patient's signature needed at discharge.  Patient to Follow up at: Follow-up Information   Follow up with [facility] On 10/01/2013. (Please go to [facility]'s walk in clinic on Monday, October 01, 2013 or any weekday between [place] for medication management/counseling)    Contact information:   [address] [place], [place]   [address]  [phone]      Patient denies SI/HI:  Patient no longer endorsing SI/HI or other thoughts of self harm.    Safety Planning and Suicide Prevention discussed: .Reviewed with all patients during discharge planning group.  Concha Pyo 09/28/2013, 12:51 PM

## 2013-09-28 NOTE — Tx Team (Addendum)
Interdisciplinary Treatment Plan Update   Date Reviewed:  09/28/2013  Time Reviewed:  8:39 AM  Progress in Treatment:   Attending groups: Yes Participating in groups: Yes Taking medication as prescribed: Yes  Tolerating medication: Yes Family/Significant other contact made:  No, patient declined collateral contact Patient understands diagnosis: Yes  Discussing patient identified problems/goals with staff: Yes Medical problems stabilized or resolved: Yes Denies suicidal/homicidal ideation: Yes Patient has not harmed self or others: Yes  For review of initial/current patient goals, please see plan of care.  Estimated Length of Stay:  Discharge today  Reasons for Continued Hospitalization:   New Problems/Goals identified:    Discharge Plan or Barriers:   Home with outpatient follow up with [facility]  Additional Comments:    Attendees:  Patient:  09/28/2013 8:39 AM   Signature:  Gabriel Earing, MD 09/28/2013 8:39 AM  Signature:  09/28/2013 8:39 AM  Signature: Precious Bard Dukes 09/28/2013 8:39 AM  Signature:  Eduard Roux, RN 09/28/2013 8:39 AM  Signature:   09/28/2013 8:39 AM  Signature:  Joette Catching, LCSW 09/28/2013 8:39 AM  Signature:  Regan Lemming, LCSW 09/28/2013 8:39 AM  Signature:  Marijo Sanes Transition Team 09/28/2013 8:39 AM  Signature: 09/28/2013 8:39 AM  Signature:  09/28/2013  8:39 AM  Signature:  09/28/2013  8:39 AM  Signature: 09/28/2013  8:39 AM    Scribe for Treatment Team:   Joette Catching,  09/28/2013 8:39 AM

## 2013-09-28 NOTE — Progress Notes (Signed)
Pt was discharged and stated he would take the bus and go stay with his aunt. Pt was given Rx and some medications along with follow up appointments. Pt does contract for safety and denies SI and HI. He does feel that being here helped him a great deal,.

## 2013-09-28 NOTE — Discharge Summary (Signed)
Physician Discharge Summary Note  Patient:  John [name] is an 43 y.o., male MRN:  [id number] DOB:  06/03/1970 Patient phone:  [phone] (home)  Patient address:   [place] [address],  Total Time spent with patient: 20 minutes  Date of Admission:  09/20/2013 Date of Discharge: 09/28/13  Reason for Admission:  Depression, Psychosis  Discharge Diagnoses: Active Problems:   MDD (major depressive disorder), recurrent episode, severe   MDD (major depressive disorder)   Psychiatric Specialty Exam: Physical Exam  Review of Systems  Constitutional: Negative.   HENT: Negative.   Eyes: Negative.   Respiratory: Negative.   Cardiovascular: Negative.   Gastrointestinal: Negative.   Genitourinary: Negative.   Musculoskeletal: Negative.   Skin: Negative.   Neurological: Negative.   Endo/Heme/Allergies: Negative.   Psychiatric/Behavioral: Negative.     Blood pressure 130/95, pulse 106, temperature 97.6 F (36.4 C), temperature source Oral, resp. rate 18, height 5' 7.5" (1.715 m), weight 86.864 kg (191 lb 8 oz), SpO2 97.00%.Body mass index is 29.53 kg/(m^2).  See Physician SRA                                                  Past Psychiatric History: See H&P Diagnosis:  Hospitalizations:  Outpatient Care:  Substance Abuse Care:  Self-Mutilation:  Suicidal Attempts:  Violent Behaviors:   Musculoskeletal: Strength & Muscle Tone: within normal limits Gait & Station: normal Patient leans: N/A  DSM5: AXIS I: Depressive Disorder NOS and Cocaine Abuse  AXIS II: Deferred  AXIS III:  Past Medical History   Diagnosis  Date   .  Asthma    .  HIV (human immunodeficiency virus infection)      "dx'd ~ 2 yr ago" (09/29/2012)   .  Mental disorder    .  Anxiety    .  Depression    .  Chronic low back pain    .  Alcohol abuse     AXIS IV: economic problems, housing problems and occupational problems, recent loss of loved ones   AXIS V: 51-60 moderate symptoms ( 60 upon discharge)   Level of Care:  OP  Hospital Course:  [name] is a 43 year old male who initially presented to the [facility] for complaints of gout related pain. Upon discharge the patient informed ED staff of his desire to jump from a bridge. Patient reported having run out of medications due to not having transportation to [place]. John [name] is currently homeless as he is staying with friends. Patient states today during his psychiatric assessment "The voices started a week ago. They tell me to jump off a bridge. That it would be better for me to die. I am depressed. I have had several recent losses. My brother died in a drive by shooting and my grandmother in a car accident. I have nobody left that I really care about. I just want to be with them. I've been off medications for three to four months. I might have handled all this better if I had been able to take them. I have been homeless for years. I have HIV. I have never taken any medications for it. I am supposed to see the ID Doctor later this month. They will let me know if I need to start taking them or not." Couper becomes very tearful  during the interview when discussing his numerous stressors. He appears very depressed. Rates his depression at seven. Patient reports that he continues to experience auditory hallucinations and to feel suicidal. Patient admits to marijuana use to cope with his feelings. He denies cocaine reporting that the marijuana must have been laced with it.          John [name] was admitted to the adult 500 unit. He was evaluated and his symptoms were identified. Medication management was discussed and initiated. Patient was started on Remeron 30 mg hs for depression and insomnia. He was started on Seroquel 400 mg hs for psychosis. He was oriented to the unit and encouraged to participate in unit programming. Medical problems were identified and treated appropriately. Home medication  was restarted as needed.        The patient was evaluated each day by a clinical provider to ascertain the patient's response to treatment.  Improvement was noted by the patient's report of decreasing symptoms, improved sleep and appetite, affect, medication tolerance, behavior, and participation in unit programming.  He was asked each day to complete a self inventory noting mood, mental status, pain, new symptoms, anxiety and concerns.         He responded well to medication and being in a therapeutic and supportive environment. Positive and appropriate behavior was noted and the patient was motivated for recovery. He continued to express appropriate grief over the loss of several family members recently.  The patient worked closely with the treatment team and case manager to develop a discharge plan with appropriate goals. Coping skills, problem solving as well as relaxation therapies were also part of the unit programming.         By the day of discharge he was in much improved condition than upon admission.  Symptoms were reported as significantly decreased or resolved completely.  The patient denied SI/HI and voiced no AVH. He was motivated to continue taking medication with a goal of continued improvement in mental health. John [name] was discharged home with a plan to follow up as noted below. Patient was provided with prescriptions and sample medications.   Consults:  psychiatry  Significant Diagnostic Studies:  Chemistry, CBC, UDS positive for cocaine and marijuana  Discharge Vitals:   Blood pressure 130/95, pulse 106, temperature 97.6 F (36.4 C), temperature source Oral, resp. rate 18, height 5' 7.5" (1.715 m), weight 86.864 kg (191 lb 8 oz), SpO2 97.00%. Body mass index is 29.53 kg/(m^2). Lab Results:   No results found for this or any previous visit (from the past 72 hour(s)).  Physical Findings: AIMS: Facial and Oral Movements Muscles of Facial Expression: None, normal Lips and  Perioral Area: None, normal Jaw: None, normal Tongue: None, normal,Extremity Movements Upper (arms, wrists, hands, fingers): None, normal Lower (legs, knees, ankles, toes): None, normal, Trunk Movements Neck, shoulders, hips: None, normal, Overall Severity Severity of abnormal movements (highest score from questions above): None, normal Incapacitation due to abnormal movements: None, normal Patient's awareness of abnormal movements (rate only patient's report): No Awareness, Dental Status Current problems with teeth and/or dentures?: No Does patient usually wear dentures?: No  CIWA:  CIWA-Ar Total: 2 COWS:  COWS Total Score: 2  Psychiatric Specialty Exam: See Psychiatric Specialty Exam and Suicide Risk Assessment completed by Attending Physician prior to discharge.  Discharge destination:  Home  Is patient on multiple antipsychotic therapies at discharge:  No   Has Patient had three or more failed trials of antipsychotic monotherapy by history:  No  Recommended Plan for Multiple Antipsychotic Therapies: NA     Medication List       Indication   albuterol 108 (90 BASE) MCG/ACT inhaler  Commonly known as:  PROVENTIL HFA;VENTOLIN HFA  Inhale 2 puffs into the lungs every 4 (four) hours as needed for wheezing or shortness of breath.      HYDROcodone-acetaminophen 5-325 MG per tablet  Commonly known as:  NORCO/VICODIN  Take 2 tablets by mouth every 6 (six) hours as needed for moderate pain or severe pain.      indomethacin 25 MG capsule  Commonly known as:  INDOCIN  Take 1 capsule (25 mg total) by mouth 2 (two) times daily with a meal.   Indication:  Acute Pseudogout Attack     mirtazapine 30 MG tablet  Commonly known as:  REMERON  Take 1 tablet (30 mg total) by mouth at bedtime. For sleep and depression.   Indication:  Major Depressive Disorder     QUEtiapine 400 MG tablet  Commonly known as:  SEROQUEL  Take 1 tablet (400 mg total) by mouth at bedtime.   Indication:   psychosis/mood control     traZODone 50 MG tablet  Commonly known as:  DESYREL  Take 1 tablet (50 mg total) by mouth at bedtime as needed for sleep.   Indication:  Trouble Sleeping           Follow-up Information   Follow up with [facility] On 10/01/2013. (Please go to [facility]'s walk in clinic on Monday, October 01, 2013 or any weekday between [place] for medication management/counseling)    Contact information:   [address] [place], [place]   [address]  [phone]      Follow-up recommendations:   Activity: As tolerated  Diet: Regular  Tests: NA  Other: See below   Comments:   Take all your medications as prescribed by your mental healthcare provider.  Report any adverse effects and or reactions from your medicines to your outpatient provider promptly.  Patient is instructed and cautioned to not engage in alcohol and or illegal drug use while on prescription medicines.  In the event of worsening symptoms, patient is instructed to call the crisis hotline, 911 and or go to the nearest ED for appropriate evaluation and treatment of symptoms.  Follow-up with your primary care provider for your other medical issues, concerns and or health care needs.   Total Discharge Time:  Greater than 30 minutes.  SignedElmarie Shiley NP-C 09/28/2013, 1:13 PM   Patient seen, Suicide Assessment Completed.  Disposition Plan Reviewed

## 2013-09-28 NOTE — [facility] Group Notes (Signed)
[facility] LCSW Aftercare Discharge Planning Group Note   09/28/2013 10:41 AM    Participation Quality:  Appropraite  Mood/Affect:  Appropriate  Depression Rating:  1  Anxiety Rating:  1  Thoughts of Suicide:  No  Will you contract for safety?   NA  Current AVH:  No  Plan for Discharge/Comments:  Patient attended discharge planning group and actively participated in group.  He reports he will live with his aunt and follow up with [facility].  CSW provided all participants with daily workbook.   Transportation Means: Patient has transportation.   Supports:  Patient has a support system.   John [name], Eulas Post

## 2013-09-28 NOTE — [facility] Suicide Risk Assessment (Signed)
Demographic Factors:  43 year old man, currently homeless, unemployed  Total Time spent with patient: 30 minutes  Psychiatric Specialty Exam: Physical Exam  ROS  Blood pressure 130/95, pulse 106, temperature 97.6 F (36.4 C), temperature source Oral, resp. rate 18, height 5' 7.5" (1.715 m), weight 86.864 kg (191 lb 8 oz), SpO2 97.00%.Body mass index is 29.53 kg/(m^2).  General Appearance: improved grooming  Eye Contact::  Good  Speech:  Normal Rate  Volume:  Normal  Mood:  Euthymic, states he feels much better, and he presents with an euthymic mood   Affect:  Appropriate and fuller in range  Thought Process:  Goal Directed and Linear  Orientation:  Full (Time, Place, and Person)  Thought Content:  Denies any hallucinations, no delusions, does not appear internally preoccupied  Suicidal Thoughts:  No- at this time denies any thoughts of suicide or death, and denies any thoughts of violence towards anyone  Homicidal Thoughts:  No  Memory:  NA  Judgement:  Fair  Insight:  Fair  Psychomotor Activity:  Normal  Concentration:  Good  Recall:  Good  Fund of Knowledge:Good  Language: Good  Akathisia:  Negative  Handed:  Right  AIMS (if indicated):     Assets:  Communication Skills Desire for Improvement Resilience  Sleep:  Number of Hours: 6  Patient reports that he has spoken with an aunt who has agreed for him to live with her - he states this is very positive as he will not be  Homeless and will have more support.   Musculoskeletal: Strength & Muscle Tone: within normal limits Gait & Station: normal Patient leans: N/A   Mental Status Per Nursing Assessment::   On Admission:     Current Mental Status by Physician: At this time patient much improved, with an improved mood, improved range of affect, no thought disorder, no suicidal or homicidal ideations, and no psychotic symptoms.  Loss Factors: Recent death of family members, homelessness  Historical  Factors: History of depression, history of substance abuse  Risk Reduction Factors:   Positive coping skills or problem solving skills and resilience Also, will be staying with an aunt, who is supportive   Continued Clinical Symptoms:  Improved mood and affect, no SI , no psychotic symptoms at this time  Cognitive Features That Contribute To Risk:  No gross cognitive issues upon discharge  Suicide Risk:  Mild:  Suicidal ideation of limited frequency, intensity, duration, and specificity.  There are no identifiable plans, no associated intent, mild dysphoria and related symptoms, good self-control (both objective and subjective assessment), few other risk factors, and identifiable protective factors, including available and accessible social support.  Discharge Diagnoses:   AXIS I:  Depressive Disorder NOS and Cocaine Abuse  AXIS II:  Deferred AXIS III:   Past Medical History  Diagnosis Date  . Asthma   . HIV (human immunodeficiency virus infection)     "dx'd ~ 2 yr ago" (09/29/2012)  . Mental disorder   . Anxiety   . Depression   . Chronic low back pain   . Alcohol abuse    AXIS IV:  economic problems, housing problems and occupational problems, recent loss of loved ones AXIS V:  51-60 moderate symptoms ( 60 upon discharge)   Plan Of Care/Follow-up recommendations:  Activity:  As tolerated  Diet:  Regular Tests:  NA Other:  See below  Is patient on multiple antipsychotic therapies at discharge:  No   Has Patient had three or more  failed trials of antipsychotic monotherapy by history:  No  Recommended Plan for Multiple Antipsychotic Therapies: NA  Patient is leaving unit in good spirits. Patient states he will be living with an aunt, who is supportive.  Plans to go to 12 step meetings and states he is motivated to stay sober.  States he follows up at [facility], but will also be referred to [facility] for medical care as  needed. Will follow up at [facility] for ongoing psychiatric services    Danee Soller, [place] 09/28/2013, 9:07 AM

## 2013-10-02 NOTE — Progress Notes (Signed)
Patient Discharge Instructions:  After Visit Summary (AVS):   Faxed to:  10/02/13 Discharge Summary Note:   Faxed to:  10/02/13 Psychiatric Admission Assessment Note:   Faxed to:  10/02/13 Suicide Risk Assessment - Discharge Assessment:   Faxed to:  10/02/13 Faxed/Sent to the Next Level Care provider:  10/02/13 Faxed to [facility] [place], 10/02/2013, 3:39 PM

## 2013-10-12 ENCOUNTER — Encounter (HOSPITAL_COMMUNITY): Payer: Self-pay | Admitting: Emergency Medicine

## 2013-10-12 ENCOUNTER — Observation Stay (HOSPITAL_COMMUNITY)
Admission: EM | Admit: 2013-10-12 | Discharge: 2013-10-14 | Disposition: A | Discharge status: Home / Self Care | Payer: Self-pay | Attending: General Surgery | Admitting: General Surgery

## 2013-10-12 ENCOUNTER — Emergency Department (HOSPITAL_COMMUNITY): Payer: Self-pay

## 2013-10-12 DIAGNOSIS — Z21 Asymptomatic human immunodeficiency virus [HIV] infection status: Secondary | ICD-10-CM | POA: Diagnosis present

## 2013-10-12 DIAGNOSIS — B20 Human immunodeficiency virus [HIV] disease: Secondary | ICD-10-CM | POA: Diagnosis present

## 2013-10-12 DIAGNOSIS — S71009A Unspecified open wound, unspecified hip, initial encounter: Secondary | ICD-10-CM | POA: Insufficient documentation

## 2013-10-12 DIAGNOSIS — Y249XXA Unspecified firearm discharge, undetermined intent, initial encounter: Secondary | ICD-10-CM | POA: Insufficient documentation

## 2013-10-12 DIAGNOSIS — I10 Essential (primary) hypertension: Secondary | ICD-10-CM | POA: Insufficient documentation

## 2013-10-12 DIAGNOSIS — S71109A Unspecified open wound, unspecified thigh, initial encounter: Principal | ICD-10-CM | POA: Insufficient documentation

## 2013-10-12 DIAGNOSIS — J45909 Unspecified asthma, uncomplicated: Secondary | ICD-10-CM | POA: Insufficient documentation

## 2013-10-12 DIAGNOSIS — W3400XA Accidental discharge from unspecified firearms or gun, initial encounter: Secondary | ICD-10-CM

## 2013-10-12 HISTORY — DX: Essential (primary) hypertension: I10

## 2013-10-12 HISTORY — DX: Unspecified asthma, uncomplicated: J45.909

## 2013-10-12 HISTORY — DX: Human immunodeficiency virus (HIV) disease: B20

## 2013-10-12 LAB — PREPARE FRESH FROZEN PLASMA
UNIT DIVISION: 0
Unit division: 0

## 2013-10-12 LAB — COMPREHENSIVE METABOLIC PANEL
ALK PHOS: 79 U/L (ref 39–117)
ALT: 19 U/L (ref 0–53)
AST: 24 U/L (ref 0–37)
Albumin: 4 g/dL (ref 3.5–5.2)
Anion gap: 21 — ABNORMAL HIGH (ref 5–15)
BUN: 12 mg/dL (ref 6–23)
CO2: 19 mEq/L (ref 19–32)
Calcium: 8.9 mg/dL (ref 8.4–10.5)
Chloride: 98 mEq/L (ref 96–112)
Creatinine, Ser: 0.8 mg/dL (ref 0.50–1.35)
GFR calc non Af Amer: >90 mL/min (ref >90)
Glucose, Bld: 87 mg/dL (ref 70–99)
POTASSIUM: 3.3 meq/L — AB (ref 3.7–5.3)
SODIUM: 138 meq/L (ref 137–147)
TOTAL PROTEIN: 7.5 g/dL (ref 6.0–8.3)
Total Bilirubin: 0.4 mg/dL (ref 0.3–1.2)

## 2013-10-12 LAB — I-STAT CHEM 8, ED
BUN: 12 mg/dL (ref 6–23)
CHLORIDE: 104 meq/L (ref 96–112)
CREATININE: 1.1 mg/dL (ref 0.50–1.35)
Calcium, Ion: 1.11 mmol/L — ABNORMAL LOW (ref 1.12–1.23)
GLUCOSE: 93 mg/dL (ref 70–99)
HCT: 42 % (ref 39.0–52.0)
HEMOGLOBIN: 14.3 g/dL (ref 13.0–17.0)
POTASSIUM: 3.2 meq/L — AB (ref 3.7–5.3)
SODIUM: 137 meq/L (ref 137–147)
TCO2: 19 mmol/L (ref 0–100)

## 2013-10-12 LAB — CBC WITH DIFFERENTIAL/PLATELET
Basophils Absolute: 0 10*3/uL (ref 0.0–0.1)
Basophils Relative: 0 % (ref 0–1)
EOS PCT: 3 % (ref 0–5)
Eosinophils Absolute: 0.2 10*3/uL (ref 0.0–0.7)
HCT: 38.8 % — ABNORMAL LOW (ref 39.0–52.0)
Hemoglobin: 13.3 g/dL (ref 13.0–17.0)
LYMPHS ABS: 3 10*3/uL (ref 0.7–4.0)
LYMPHS PCT: 47 % — AB (ref 12–46)
MCH: 31.2 pg (ref 26.0–34.0)
MCHC: 34.3 g/dL (ref 30.0–36.0)
MCV: 91.1 fL (ref 78.0–100.0)
Monocytes Absolute: 0.6 10*3/uL (ref 0.1–1.0)
Monocytes Relative: 9 % (ref 3–12)
NEUTROS ABS: 2.7 10*3/uL (ref 1.7–7.7)
Neutrophils Relative %: 41 % — ABNORMAL LOW (ref 43–77)
PLATELETS: 306 10*3/uL (ref 150–400)
RBC: 4.26 MIL/uL (ref 4.22–5.81)
RDW: 13.4 % (ref 11.5–15.5)
WBC: 6.5 10*3/uL (ref 4.0–10.5)

## 2013-10-12 LAB — ETHANOL: Alcohol, Ethyl (B): 195 mg/dL — ABNORMAL HIGH (ref 0–11)

## 2013-10-12 LAB — CK TOTAL AND CKMB (NOT AT ARMC)
CK TOTAL: 191 U/L (ref 7–232)
CK, MB: 2.3 ng/mL (ref 0.3–4.0)
RELATIVE INDEX: 1.2 (ref 0.0–2.5)

## 2013-10-12 MED ORDER — FENTANYL CITRATE 0.05 MG/ML IJ SOLN
50.0000 ug | Freq: Once | INTRAMUSCULAR | Status: AC
  Administered 2013-10-12: 50 ug via INTRAVENOUS

## 2013-10-12 MED ORDER — TETANUS-DIPHTH-ACELL PERTUSSIS 5-2.5-18.5 LF-MCG/0.5 IM SUSP
0.5000 mL | Freq: Once | INTRAMUSCULAR | Status: AC
  Administered 2013-10-12: 0.5 mL via INTRAMUSCULAR

## 2013-10-12 MED ORDER — MORPHINE SULFATE 2 MG/ML IJ SOLN
2.0000 mg | INTRAMUSCULAR | Status: DC | PRN
  Administered 2013-10-12: 2 mg via INTRAVENOUS
  Filled 2013-10-12: qty 1

## 2013-10-12 MED ORDER — ENOXAPARIN SODIUM 40 MG/0.4ML ~~LOC~~ SOLN
40.0000 mg | SUBCUTANEOUS | Status: DC
  Administered 2013-10-12 – 2013-10-13 (×2): 40 mg via SUBCUTANEOUS
  Filled 2013-10-12 (×3): qty 0.4

## 2013-10-12 MED ORDER — POTASSIUM CHLORIDE IN NACL 20-0.9 MEQ/L-% IV SOLN
INTRAVENOUS | Status: DC
  Filled 2013-10-12: qty 1000

## 2013-10-12 MED ORDER — OXYCODONE HCL 5 MG PO TABS: 5.0000 mg | ORAL_TABLET | ORAL | Status: DC | PRN

## 2013-10-12 MED ORDER — POLYETHYLENE GLYCOL 3350 17 G PO PACK
17.0000 g | PACK | Freq: Every day | ORAL | Status: DC
  Administered 2013-10-12 – 2013-10-14 (×3): 17 g via ORAL
  Filled 2013-10-12 (×4): qty 1

## 2013-10-12 MED ORDER — PANTOPRAZOLE SODIUM 40 MG PO TBEC
40.0000 mg | DELAYED_RELEASE_TABLET | Freq: Every day | ORAL | Status: DC
  Administered 2013-10-12 – 2013-10-14 (×3): 40 mg via ORAL
  Filled 2013-10-12 (×3): qty 1

## 2013-10-12 MED ORDER — OXYCODONE HCL 5 MG PO TABS
5.0000 mg | ORAL_TABLET | ORAL | Status: DC | PRN
  Administered 2013-10-12: 10 mg via ORAL
  Administered 2013-10-12 – 2013-10-14 (×9): 15 mg via ORAL
  Administered 2013-10-14: 10 mg via ORAL
  Administered 2013-10-14: 15 mg via ORAL
  Filled 2013-10-12 (×5): qty 3
  Filled 2013-10-12: qty 2
  Filled 2013-10-12 (×5): qty 3

## 2013-10-12 MED ORDER — OXYCODONE HCL 5 MG PO TABS
10.0000 mg | ORAL_TABLET | ORAL | Status: DC | PRN
  Filled 2013-10-12: qty 2

## 2013-10-12 MED ORDER — BACITRACIN ZINC 500 UNIT/GM EX OINT
TOPICAL_OINTMENT | Freq: Two times a day (BID) | CUTANEOUS | Status: DC
  Administered 2013-10-12 – 2013-10-13 (×3): via TOPICAL
  Administered 2013-10-14: 1 via TOPICAL
  Filled 2013-10-12 (×2): qty 28.35

## 2013-10-12 MED ORDER — PANTOPRAZOLE SODIUM 40 MG IV SOLR
40.0000 mg | Freq: Every day | INTRAVENOUS | Status: DC
  Filled 2013-10-12 (×2): qty 40

## 2013-10-12 MED ORDER — METHOCARBAMOL 750 MG PO TABS
1500.0000 mg | ORAL_TABLET | Freq: Four times a day (QID) | ORAL | Status: DC
  Administered 2013-10-12 – 2013-10-14 (×10): 1500 mg via ORAL
  Filled 2013-10-12 (×12): qty 2

## 2013-10-12 MED ORDER — MORPHINE SULFATE 2 MG/ML IJ SOLN
1.0000 mg | INTRAMUSCULAR | Status: DC | PRN
  Administered 2013-10-12: 2 mg via INTRAVENOUS
  Filled 2013-10-12: qty 1

## 2013-10-12 MED ORDER — DOCUSATE SODIUM 100 MG PO CAPS
100.0000 mg | ORAL_CAPSULE | Freq: Two times a day (BID) | ORAL | Status: DC
Start: 2013-10-12 — End: 2013-10-14
  Administered 2013-10-12 – 2013-10-14 (×4): 100 mg via ORAL
  Filled 2013-10-12 (×6): qty 1

## 2013-10-12 MED ORDER — SODIUM CHLORIDE 0.9 % IV SOLN
INTRAVENOUS | Status: AC | PRN
  Administered 2013-10-12: 999 mL/h via INTRAVENOUS

## 2013-10-12 MED ORDER — ONDANSETRON HCL 4 MG/2ML IJ SOLN: 4.0000 mg | Freq: Four times a day (QID) | INTRAMUSCULAR | Status: DC | PRN

## 2013-10-12 MED ORDER — ACETAMINOPHEN 325 MG PO TABS
650.0000 mg | ORAL_TABLET | ORAL | Status: DC | PRN
  Administered 2013-10-14: 650 mg via ORAL
  Filled 2013-10-12: qty 2

## 2013-10-12 MED ORDER — ONDANSETRON HCL 4 MG PO TABS: 4.0000 mg | ORAL_TABLET | Freq: Four times a day (QID) | ORAL | Status: DC | PRN
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
## 2013-10-12 NOTE — Progress Notes (Signed)
Quite sore after getting up with PT. MM relaxer added. Patient examined and I agree with the assessment and plan  Georganna Skeans, MD, MPH, FACS Trauma: [phone] General Surgery: [phone]  10/12/2013 10:28 AM
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
## 2013-10-12 NOTE — Progress Notes (Signed)
Utilization Review Completed.John [name] T7/31/2015  
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
## 2013-10-13 LAB — CBC
HCT: 35.8 % — ABNORMAL LOW (ref 39.0–52.0)
HEMOGLOBIN: 12 g/dL — AB (ref 13.0–17.0)
MCH: 30.8 pg (ref 26.0–34.0)
MCHC: 33.5 g/dL (ref 30.0–36.0)
MCV: 92 fL (ref 78.0–100.0)
Platelets: 288 10*3/uL (ref 150–400)
RBC: 3.89 MIL/uL — ABNORMAL LOW (ref 4.22–5.81)
RDW: 13.4 % (ref 11.5–15.5)
WBC: 6.1 10*3/uL (ref 4.0–10.5)

## 2013-10-13 LAB — BASIC METABOLIC PANEL
Anion gap: 14 (ref 5–15)
BUN: 12 mg/dL (ref 6–23)
CO2: 22 meq/L (ref 19–32)
Calcium: 8.2 mg/dL — ABNORMAL LOW (ref 8.4–10.5)
Chloride: 102 mEq/L (ref 96–112)
Creatinine, Ser: 0.89 mg/dL (ref 0.50–1.35)
GFR calc Af Amer: >90 mL/min (ref >90)
GFR calc non Af Amer: >90 mL/min (ref >90)
GLUCOSE: 95 mg/dL (ref 70–99)
POTASSIUM: 3.9 meq/L (ref 3.7–5.3)
SODIUM: 138 meq/L (ref 137–147)

## 2013-10-13 MED ORDER — ALBUTEROL SULFATE (2.5 MG/3ML) 0.083% IN NEBU
3.0000 mL | INHALATION_SOLUTION | RESPIRATORY_TRACT | Status: DC | PRN
  Administered 2013-10-13: 3 mL via RESPIRATORY_TRACT
  Filled 2013-10-13: qty 3

## 2013-10-13 MED ORDER — DIPHENHYDRAMINE HCL 25 MG PO CAPS
25.0000 mg | ORAL_CAPSULE | Freq: Every evening | ORAL | Status: DC | PRN
  Administered 2013-10-13: 25 mg via ORAL
  Filled 2013-10-13: qty 1

## 2013-10-13 NOTE — Progress Notes (Signed)
Pt seen.  Sleeping.  Agree with PA note.   Await placement.
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
## 2013-10-14 MED ORDER — OXYCODONE-ACETAMINOPHEN 5-325 MG PO TABS: 1.0000 | ORAL_TABLET | Freq: Four times a day (QID) | ORAL | Status: DC | PRN
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
## 2013-10-14 NOTE — Progress Notes (Signed)
Utilization Review Completed.   Jsiah Menta, RN, BSN Nurse Case Manager  
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
## 2013-10-14 NOTE — Progress Notes (Signed)
Per [facility] representative they can accept patient today without an picture I.D. Per records at [facility] patient has not stayed there since 2013 and they have an old expired I.D on file. Clinical Education officer, museum (CSW) gave patient taxi voucher for transport to [facility]. Patient, RN and RN tech aware of above. CSW paged MD to give update. Please reconsult if future social work needs arise. CSW signing off.   Blima Rich, [place] Weekend CSW [phone]

## 2013-10-14 NOTE — Progress Notes (Signed)
Seen, agree with above. Await placement.
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
## 2013-10-15 ENCOUNTER — Telehealth (HOSPITAL_COMMUNITY): Payer: Self-pay

## 2013-10-15 ENCOUNTER — Encounter (HOSPITAL_COMMUNITY): Payer: Self-pay | Admitting: *Deleted

## 2013-10-15 ENCOUNTER — Encounter (INDEPENDENT_AMBULATORY_CARE_PROVIDER_SITE_OTHER): Payer: Self-pay | Admitting: Orthopedic Surgery

## 2013-10-15 NOTE — Telephone Encounter (Signed)
Faxed letter to [employer].

## 2013-10-18 ENCOUNTER — Emergency Department (HOSPITAL_COMMUNITY)
Admission: EM | Admit: 2013-10-18 | Discharge: 2013-10-18 | Discharge status: Against Medical Advice | Payer: Self-pay | Attending: Emergency Medicine | Admitting: Emergency Medicine

## 2013-10-18 ENCOUNTER — Encounter (HOSPITAL_COMMUNITY): Payer: Self-pay | Admitting: Emergency Medicine

## 2013-10-18 DIAGNOSIS — J45909 Unspecified asthma, uncomplicated: Secondary | ICD-10-CM | POA: Insufficient documentation

## 2013-10-18 DIAGNOSIS — M79609 Pain in unspecified limb: Secondary | ICD-10-CM | POA: Insufficient documentation

## 2013-10-18 DIAGNOSIS — Z8659 Personal history of other mental and behavioral disorders: Secondary | ICD-10-CM | POA: Insufficient documentation

## 2013-10-18 DIAGNOSIS — Z21 Asymptomatic human immunodeficiency virus [HIV] infection status: Secondary | ICD-10-CM | POA: Insufficient documentation

## 2013-10-18 DIAGNOSIS — G8929 Other chronic pain: Secondary | ICD-10-CM | POA: Insufficient documentation

## 2013-10-18 DIAGNOSIS — F17200 Nicotine dependence, unspecified, uncomplicated: Secondary | ICD-10-CM | POA: Insufficient documentation

## 2013-10-18 DIAGNOSIS — I10 Essential (primary) hypertension: Secondary | ICD-10-CM | POA: Insufficient documentation

## 2013-10-18 DIAGNOSIS — R45851 Suicidal ideations: Secondary | ICD-10-CM | POA: Insufficient documentation
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
## 2013-10-18 NOTE — ED Notes (Signed)
Reminded pt to not leave his walker.  States "you can keep it and stick it up her ass!Marland Kitchen"
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
## 2013-10-19 NOTE — ED Provider Notes (Signed)
Medical screening examination/treatment/procedure(s) were performed by non-physician practitioner and as supervising physician I was immediately available for consultation/collaboration.   EKG Interpretation None      Rolland Porter, MD, Abram Sander   Janice Norrie, MD 10/19/13 [phone]

## 2013-11-14 ENCOUNTER — Emergency Department (HOSPITAL_COMMUNITY)
Admission: EM | Admit: 2013-11-14 | Discharge: 2013-11-15 | Disposition: A | Discharge status: Other Acute Inpatient Hospital | Payer: Self-pay | Attending: Emergency Medicine | Admitting: Emergency Medicine

## 2013-11-14 ENCOUNTER — Encounter (HOSPITAL_COMMUNITY): Payer: Self-pay | Admitting: Emergency Medicine

## 2013-11-14 DIAGNOSIS — R443 Hallucinations, unspecified: Secondary | ICD-10-CM | POA: Insufficient documentation

## 2013-11-14 DIAGNOSIS — F1010 Alcohol abuse, uncomplicated: Secondary | ICD-10-CM | POA: Insufficient documentation

## 2013-11-14 DIAGNOSIS — F3289 Other specified depressive episodes: Secondary | ICD-10-CM | POA: Insufficient documentation

## 2013-11-14 DIAGNOSIS — F17200 Nicotine dependence, unspecified, uncomplicated: Secondary | ICD-10-CM | POA: Insufficient documentation

## 2013-11-14 DIAGNOSIS — G8929 Other chronic pain: Secondary | ICD-10-CM | POA: Insufficient documentation

## 2013-11-14 DIAGNOSIS — I10 Essential (primary) hypertension: Secondary | ICD-10-CM | POA: Insufficient documentation

## 2013-11-14 DIAGNOSIS — F411 Generalized anxiety disorder: Secondary | ICD-10-CM | POA: Insufficient documentation

## 2013-11-14 DIAGNOSIS — F32A Depression, unspecified: Secondary | ICD-10-CM

## 2013-11-14 DIAGNOSIS — J45901 Unspecified asthma with (acute) exacerbation: Secondary | ICD-10-CM | POA: Insufficient documentation

## 2013-11-14 DIAGNOSIS — Z21 Asymptomatic human immunodeficiency virus [HIV] infection status: Secondary | ICD-10-CM | POA: Insufficient documentation

## 2013-11-14 DIAGNOSIS — F329 Major depressive disorder, single episode, unspecified: Secondary | ICD-10-CM | POA: Insufficient documentation

## 2013-11-14 DIAGNOSIS — F10920 Alcohol use, unspecified with intoxication, uncomplicated: Secondary | ICD-10-CM

## 2013-11-14 DIAGNOSIS — R45851 Suicidal ideations: Secondary | ICD-10-CM | POA: Insufficient documentation

## 2013-11-14 LAB — COMPREHENSIVE METABOLIC PANEL
ALK PHOS: 78 U/L (ref 39–117)
ALT: 18 U/L (ref 0–53)
AST: 24 U/L (ref 0–37)
Albumin: 4.2 g/dL (ref 3.5–5.2)
Anion gap: 20 — ABNORMAL HIGH (ref 5–15)
BILIRUBIN TOTAL: 0.5 mg/dL (ref 0.3–1.2)
BUN: 10 mg/dL (ref 6–23)
CHLORIDE: 104 meq/L (ref 96–112)
CO2: 20 meq/L (ref 19–32)
Calcium: 9 mg/dL (ref 8.4–10.5)
Creatinine, Ser: 1.07 mg/dL (ref 0.50–1.35)
GFR, EST NON AFRICAN AMERICAN: 84 mL/min — AB (ref >90)
GLUCOSE: 83 mg/dL (ref 70–99)
Potassium: 4.1 mEq/L (ref 3.7–5.3)
SODIUM: 144 meq/L (ref 137–147)
Total Protein: 8.2 g/dL (ref 6.0–8.3)

## 2013-11-14 LAB — DIFFERENTIAL
Basophils Absolute: 0 10*3/uL (ref 0.0–0.1)
Basophils Relative: 0 % (ref 0–1)
Eosinophils Absolute: 0.1 10*3/uL (ref 0.0–0.7)
Eosinophils Relative: 3 % (ref 0–5)
LYMPHS PCT: 62 % — AB (ref 12–46)
Lymphs Abs: 3.3 10*3/uL (ref 0.7–4.0)
MONOS PCT: 9 % (ref 3–12)
Monocytes Absolute: 0.5 10*3/uL (ref 0.1–1.0)
Neutro Abs: 1.3 10*3/uL — ABNORMAL LOW (ref 1.7–7.7)
Neutrophils Relative %: 26 % — ABNORMAL LOW (ref 43–77)

## 2013-11-14 LAB — LIPASE, BLOOD: LIPASE: 12 U/L (ref 11–59)

## 2013-11-14 LAB — URINALYSIS, ROUTINE W REFLEX MICROSCOPIC
Bilirubin Urine: NEGATIVE
GLUCOSE, UA: NEGATIVE mg/dL
HGB URINE DIPSTICK: NEGATIVE
KETONES UR: NEGATIVE mg/dL
LEUKOCYTES UA: NEGATIVE
Nitrite: NEGATIVE
PH: 5 (ref 5.0–8.0)
Protein, ur: NEGATIVE mg/dL
Specific Gravity, Urine: 1.018 (ref 1.005–1.030)
Urobilinogen, UA: 0.2 mg/dL (ref 0.0–1.0)

## 2013-11-14 LAB — SALICYLATE LEVEL: Salicylate Lvl: <2 mg/dL — ABNORMAL LOW (ref 2.8–20.0)

## 2013-11-14 LAB — ETHANOL: Alcohol, Ethyl (B): 246 mg/dL — ABNORMAL HIGH (ref 0–11)

## 2013-11-14 LAB — CBC
HCT: 40 % (ref 39.0–52.0)
HEMOGLOBIN: 13.6 g/dL (ref 13.0–17.0)
MCH: 31.3 pg (ref 26.0–34.0)
MCHC: 34 g/dL (ref 30.0–36.0)
MCV: 92 fL (ref 78.0–100.0)
PLATELETS: 322 10*3/uL (ref 150–400)
RBC: 4.35 MIL/uL (ref 4.22–5.81)
RDW: 13.7 % (ref 11.5–15.5)
WBC: 5.4 10*3/uL (ref 4.0–10.5)

## 2013-11-14 LAB — ACETAMINOPHEN LEVEL: Acetaminophen (Tylenol), Serum: <15 ug/mL (ref 10–30)

## 2013-11-14 MED ORDER — LORAZEPAM 1 MG PO TABS: 0.0000 mg | ORAL_TABLET | Freq: Four times a day (QID) | ORAL | Status: DC

## 2013-11-14 MED ORDER — ALUM & MAG HYDROXIDE-SIMETH 200-200-20 MG/5ML PO SUSP: 30.0000 mL | ORAL | Status: DC | PRN

## 2013-11-14 MED ORDER — LORAZEPAM 1 MG PO TABS: 0.0000 mg | ORAL_TABLET | Freq: Two times a day (BID) | ORAL | Status: DC

## 2013-11-14 MED ORDER — ZOLPIDEM TARTRATE 5 MG PO TABS: 5.0000 mg | ORAL_TABLET | Freq: Every evening | ORAL | Status: DC | PRN

## 2013-11-14 MED ORDER — TRAZODONE HCL 50 MG PO TABS
50.0000 mg | ORAL_TABLET | Freq: Every evening | ORAL | Status: DC | PRN
  Administered 2013-11-14: 50 mg via ORAL
  Filled 2013-11-14: qty 1

## 2013-11-14 MED ORDER — VITAMIN B-1 100 MG PO TABS
100.0000 mg | ORAL_TABLET | Freq: Every day | ORAL | Status: DC
  Administered 2013-11-14: 100 mg via ORAL
  Filled 2013-11-14: qty 1

## 2013-11-14 MED ORDER — ACETAMINOPHEN 325 MG PO TABS: 650.0000 mg | ORAL_TABLET | ORAL | Status: DC | PRN

## 2013-11-14 MED ORDER — THIAMINE HCL 100 MG/ML IJ SOLN: 100.0000 mg | Freq: Every day | INTRAMUSCULAR | Status: DC

## 2013-11-14 MED ORDER — QUETIAPINE FUMARATE 300 MG PO TABS
400.0000 mg | ORAL_TABLET | Freq: Every day | ORAL | Status: DC
  Administered 2013-11-14: 400 mg via ORAL
  Filled 2013-11-14 (×2): qty 1

## 2013-11-14 MED ORDER — NICOTINE 21 MG/24HR TD PT24
21.0000 mg | MEDICATED_PATCH | Freq: Every day | TRANSDERMAL | Status: DC
  Administered 2013-11-14: 21 mg via TRANSDERMAL
  Filled 2013-11-14: qty 1

## 2013-11-14 MED ORDER — ONDANSETRON HCL 4 MG PO TABS: 4.0000 mg | ORAL_TABLET | Freq: Three times a day (TID) | ORAL | Status: DC | PRN

## 2013-11-14 MED ORDER — ALBUTEROL SULFATE HFA 108 (90 BASE) MCG/ACT IN AERS: 2.0000 | INHALATION_SPRAY | RESPIRATORY_TRACT | Status: DC | PRN

## 2013-11-14 MED ORDER — ALBUTEROL SULFATE HFA 108 (90 BASE) MCG/ACT IN AERS
2.0000 | INHALATION_SPRAY | Freq: Once | RESPIRATORY_TRACT | Status: AC
  Administered 2013-11-14: 2 via RESPIRATORY_TRACT
  Filled 2013-11-14: qty 6.7

## 2013-11-14 MED ORDER — MIRTAZAPINE 30 MG PO TABS
30.0000 mg | ORAL_TABLET | Freq: Every day | ORAL | Status: DC
  Administered 2013-11-14: 30 mg via ORAL
  Filled 2013-11-14: qty 1

## 2013-11-14 MED ORDER — IBUPROFEN 200 MG PO TABS: 600.0000 mg | ORAL_TABLET | Freq: Three times a day (TID) | ORAL | Status: DC | PRN

## 2013-11-14 NOTE — ED Notes (Signed)
Patient reports SI, HI, AVH. States that he hears voices saying "hurt", "kill''. Rates feelings of anxiety 9/10, depression 10/10. Reports trouble sleeping and long term problems with depression.   Encouragement offered. Q 15 safety checks in place.

## 2013-11-14 NOTE — Progress Notes (Signed)
  CARE MANAGEMENT ED NOTE 11/14/2013  Patient:  John [name],John [name]   Account Number:  [id number]  Date Initiated:  11/14/2013  Documentation initiated by:  Livia Snellen  Subjective/Objective Assessment:   Patient presents to Ed with SI, HI, AVH. States that he hears voices saying "hurt", "kill''     Subjective/Objective Assessment Detail:   Patient with pmhx of asthma, HIV, Anxiety, depression, HTN, alcohol abuse.     Action/Plan:   Action/Plan Detail:   Anticipated DC Date:       Status Recommendation to Physician:   Result of Recommendation:    Other ED [place]  Other  PCP issues    Choice offered to / List presented to:            Status of service:  Completed, signed off  ED Comments:   ED Comments Detail:  EDCM spoke to patient at bedside.  Patient confirms he does not have insurance or a pcp living in [place]. Patient confirms he is receiving assistance with his medications through [facility].  As per patient, he has not started medications for HIV yet.  [facility] asked patient if he was aware of the [place]?  Patient reports he is aware of the [facility] but refused any printed information regarding their services.  [facility] provided patient with pamphlet to [facility].  EDCM instructed patient that walk ins are welcome Mon-Thurs from 9am-1030am Mon- Thurs.  [facility] informed patient that he may establish care there, speak to a financila counselor or social worker, receive assistance with medications and enroll for the orange card at ths [facility].  EDCM also provded patient with list of pcps who accept self pay patients, list of discounted pharmacies and websites needymeds.org and GoodRX.com for medication assistance, financial resources in the community such local churches, [employer], phone number to inquire about [employer] and DSS for [employer], and dental assistance for patients without insurance.  Patient  thankful for resources.  No further EDCM needs at this time.
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
## 2013-11-15 ENCOUNTER — Inpatient Hospital Stay (HOSPITAL_COMMUNITY)
Admission: EM | Admit: 2013-11-15 | Discharge: 2013-11-26 | DRG: 885 | Disposition: A | Discharge status: Home / Self Care | Payer: Federal, State, Local not specified - Other | Source: Intra-hospital | Attending: Psychiatry | Admitting: Psychiatry

## 2013-11-15 ENCOUNTER — Encounter (HOSPITAL_COMMUNITY): Payer: Self-pay | Admitting: *Deleted

## 2013-11-15 DIAGNOSIS — G8929 Other chronic pain: Secondary | ICD-10-CM | POA: Diagnosis present

## 2013-11-15 DIAGNOSIS — F17200 Nicotine dependence, unspecified, uncomplicated: Secondary | ICD-10-CM | POA: Diagnosis present

## 2013-11-15 DIAGNOSIS — F319 Bipolar disorder, unspecified: Secondary | ICD-10-CM

## 2013-11-15 DIAGNOSIS — Z9119 Patient's noncompliance with other medical treatment and regimen: Secondary | ICD-10-CM | POA: Diagnosis not present

## 2013-11-15 DIAGNOSIS — M109 Gout, unspecified: Secondary | ICD-10-CM | POA: Diagnosis present

## 2013-11-15 DIAGNOSIS — R45851 Suicidal ideations: Secondary | ICD-10-CM | POA: Diagnosis not present

## 2013-11-15 DIAGNOSIS — J45909 Unspecified asthma, uncomplicated: Secondary | ICD-10-CM | POA: Diagnosis present

## 2013-11-15 DIAGNOSIS — F331 Major depressive disorder, recurrent, moderate: Secondary | ICD-10-CM

## 2013-11-15 DIAGNOSIS — Z91199 Patient's noncompliance with other medical treatment and regimen due to unspecified reason: Secondary | ICD-10-CM | POA: Diagnosis not present

## 2013-11-15 DIAGNOSIS — I10 Essential (primary) hypertension: Secondary | ICD-10-CM | POA: Diagnosis present

## 2013-11-15 DIAGNOSIS — F39 Unspecified mood [affective] disorder: Secondary | ICD-10-CM | POA: Diagnosis present

## 2013-11-15 DIAGNOSIS — R030 Elevated blood-pressure reading, without diagnosis of hypertension: Secondary | ICD-10-CM

## 2013-11-15 DIAGNOSIS — G4700 Insomnia, unspecified: Secondary | ICD-10-CM | POA: Diagnosis present

## 2013-11-15 DIAGNOSIS — M545 Low back pain, unspecified: Secondary | ICD-10-CM | POA: Diagnosis present

## 2013-11-15 DIAGNOSIS — B20 Human immunodeficiency virus [HIV] disease: Secondary | ICD-10-CM | POA: Diagnosis present

## 2013-11-15 DIAGNOSIS — R45850 Homicidal ideations: Secondary | ICD-10-CM

## 2013-11-15 DIAGNOSIS — F1210 Cannabis abuse, uncomplicated: Secondary | ICD-10-CM | POA: Diagnosis present

## 2013-11-15 DIAGNOSIS — F333 Major depressive disorder, recurrent, severe with psychotic symptoms: Principal | ICD-10-CM | POA: Diagnosis present

## 2013-11-15 DIAGNOSIS — Z21 Asymptomatic human immunodeficiency virus [HIV] infection status: Secondary | ICD-10-CM

## 2013-11-15 DIAGNOSIS — F323 Major depressive disorder, single episode, severe with psychotic features: Secondary | ICD-10-CM

## 2013-11-15 DIAGNOSIS — F1020 Alcohol dependence, uncomplicated: Secondary | ICD-10-CM | POA: Diagnosis present

## 2013-11-15 DIAGNOSIS — F10229 Alcohol dependence with intoxication, unspecified: Secondary | ICD-10-CM

## 2013-11-15 DIAGNOSIS — F329 Major depressive disorder, single episode, unspecified: Secondary | ICD-10-CM | POA: Diagnosis present

## 2013-11-15 DIAGNOSIS — F3162 Bipolar disorder, current episode mixed, moderate: Secondary | ICD-10-CM

## 2013-11-15 DIAGNOSIS — F1410 Cocaine abuse, uncomplicated: Secondary | ICD-10-CM

## 2013-11-15 DIAGNOSIS — R42 Dizziness and giddiness: Secondary | ICD-10-CM

## 2013-11-15 DIAGNOSIS — F411 Generalized anxiety disorder: Secondary | ICD-10-CM | POA: Diagnosis present

## 2013-11-15 DIAGNOSIS — IMO0001 Reserved for inherently not codable concepts without codable children: Secondary | ICD-10-CM

## 2013-11-15 DIAGNOSIS — F32A Depression, unspecified: Secondary | ICD-10-CM | POA: Diagnosis present

## 2013-11-15 LAB — HEPATITIS PANEL, ACUTE
HCV AB: NEGATIVE
Hep A IgM: NONREACTIVE
Hep B C IgM: NONREACTIVE
Hepatitis B Surface Ag: NEGATIVE

## 2013-11-15 LAB — RAPID URINE DRUG SCREEN, HOSP PERFORMED
AMPHETAMINES: NOT DETECTED
BARBITURATES: NOT DETECTED
Benzodiazepines: NOT DETECTED
Cocaine: POSITIVE — AB
Opiates: NOT DETECTED
TETRAHYDROCANNABINOL: POSITIVE — AB

## 2013-11-15 MED ORDER — HYDROXYZINE HCL 25 MG PO TABS
25.0000 mg | ORAL_TABLET | Freq: Four times a day (QID) | ORAL | Status: AC | PRN
Start: 2013-11-15 — End: 2013-11-18
  Administered 2013-11-16 (×2): 25 mg via ORAL
  Filled 2013-11-15 (×2): qty 1

## 2013-11-15 MED ORDER — HYDROXYZINE HCL 25 MG PO TABS: 25.0000 mg | ORAL_TABLET | Freq: Four times a day (QID) | ORAL | Status: DC | PRN

## 2013-11-15 MED ORDER — ALUM & MAG HYDROXIDE-SIMETH 200-200-20 MG/5ML PO SUSP: 30.0000 mL | ORAL | Status: DC | PRN

## 2013-11-15 MED ORDER — NICOTINE 14 MG/24HR TD PT24
14.0000 mg | MEDICATED_PATCH | Freq: Every day | TRANSDERMAL | Status: DC
  Administered 2013-11-15 – 2013-11-26 (×12): 14 mg via TRANSDERMAL
  Filled 2013-11-15 (×17): qty 1

## 2013-11-15 MED ORDER — ADULT MULTIVITAMIN W/MINERALS CH
1.0000 | ORAL_TABLET | Freq: Every day | ORAL | Status: DC
  Administered 2013-11-15 – 2013-11-26 (×12): 1 via ORAL
  Filled 2013-11-15 (×16): qty 1

## 2013-11-15 MED ORDER — MIRTAZAPINE 30 MG PO TABS
30.0000 mg | ORAL_TABLET | Freq: Every day | ORAL | Status: DC
  Administered 2013-11-15 – 2013-11-17 (×3): 30 mg via ORAL
  Filled 2013-11-15 (×5): qty 1

## 2013-11-15 MED ORDER — ONDANSETRON 4 MG PO TBDP
4.0000 mg | ORAL_TABLET | Freq: Four times a day (QID) | ORAL | Status: AC | PRN
  Administered 2013-11-19: 4 mg via ORAL

## 2013-11-15 MED ORDER — CHLORDIAZEPOXIDE HCL 25 MG PO CAPS
25.0000 mg | ORAL_CAPSULE | Freq: Four times a day (QID) | ORAL | Status: AC | PRN
  Administered 2013-11-16 – 2013-11-17 (×2): 25 mg via ORAL
  Filled 2013-11-15 (×3): qty 1

## 2013-11-15 MED ORDER — CHLORDIAZEPOXIDE HCL 25 MG PO CAPS
25.0000 mg | ORAL_CAPSULE | Freq: Four times a day (QID) | ORAL | Status: AC
  Administered 2013-11-15 (×4): 25 mg via ORAL
  Filled 2013-11-15 (×4): qty 1

## 2013-11-15 MED ORDER — TRAZODONE HCL 50 MG PO TABS: 50.0000 mg | ORAL_TABLET | Freq: Every evening | ORAL | Status: DC | PRN

## 2013-11-15 MED ORDER — CHLORDIAZEPOXIDE HCL 25 MG PO CAPS
25.0000 mg | ORAL_CAPSULE | Freq: Three times a day (TID) | ORAL | Status: AC
  Administered 2013-11-16 (×3): 25 mg via ORAL
  Filled 2013-11-15 (×3): qty 1

## 2013-11-15 MED ORDER — CHLORDIAZEPOXIDE HCL 25 MG PO CAPS
25.0000 mg | ORAL_CAPSULE | ORAL | Status: AC
  Administered 2013-11-17 (×2): 25 mg via ORAL
  Filled 2013-11-15 (×2): qty 1

## 2013-11-15 MED ORDER — ACETAMINOPHEN 325 MG PO TABS: 650.0000 mg | ORAL_TABLET | Freq: Four times a day (QID) | ORAL | Status: DC | PRN

## 2013-11-15 MED ORDER — QUETIAPINE FUMARATE ER 400 MG PO TB24
400.0000 mg | ORAL_TABLET | Freq: Every day | ORAL | Status: DC
Start: 2013-11-15 — End: 2013-11-19
  Administered 2013-11-15 – 2013-11-18 (×4): 400 mg via ORAL
  Filled 2013-11-15 (×6): qty 1

## 2013-11-15 MED ORDER — MAGNESIUM HYDROXIDE 400 MG/5ML PO SUSP: 30.0000 mL | Freq: Every day | ORAL | Status: DC | PRN

## 2013-11-15 MED ORDER — TRAZODONE HCL 50 MG PO TABS
50.0000 mg | ORAL_TABLET | Freq: Every evening | ORAL | Status: DC | PRN
  Filled 2013-11-15: qty 1

## 2013-11-15 MED ORDER — LOPERAMIDE HCL 2 MG PO CAPS: 2.0000 mg | ORAL_CAPSULE | ORAL | Status: AC | PRN

## 2013-11-15 MED ORDER — ALBUTEROL SULFATE HFA 108 (90 BASE) MCG/ACT IN AERS
2.0000 | INHALATION_SPRAY | RESPIRATORY_TRACT | Status: DC | PRN
  Administered 2013-11-15 – 2013-11-24 (×6): 2 via RESPIRATORY_TRACT
  Filled 2013-11-15: qty 6.7

## 2013-11-15 MED ORDER — VITAMIN B-1 100 MG PO TABS
100.0000 mg | ORAL_TABLET | Freq: Every day | ORAL | Status: DC
  Administered 2013-11-16 – 2013-11-26 (×11): 100 mg via ORAL
  Filled 2013-11-15 (×14): qty 1

## 2013-11-15 MED ORDER — QUETIAPINE FUMARATE 400 MG PO TABS
400.0000 mg | ORAL_TABLET | Freq: Every day | ORAL | Status: DC
  Filled 2013-11-15: qty 1

## 2013-11-15 MED ORDER — THIAMINE HCL 100 MG/ML IJ SOLN
100.0000 mg | Freq: Once | INTRAMUSCULAR | Status: DC
  Filled 2013-11-15: qty 2

## 2013-11-15 MED ORDER — CHLORDIAZEPOXIDE HCL 25 MG PO CAPS
25.0000 mg | ORAL_CAPSULE | Freq: Every day | ORAL | Status: AC
  Administered 2013-11-18: 25 mg via ORAL
  Filled 2013-11-15: qty 1

## 2013-11-15 NOTE — [facility] Suicide Risk Assessment (Signed)
   Nursing information obtained from:  Patient Demographic factors:  Male;Low socioeconomic status;Unemployed Current Mental Status:  Suicidal ideation indicated by patient;Self-harm thoughts Loss Factors:  Decline in physical health;Financial problems / change in socioeconomic status Historical Factors:  Prior suicide attempts;Family history of mental illness or substance abuse Risk Reduction Factors:  Religious beliefs about death;Living with another person, especially a relative Total Time spent with patient: 20 minutes  CLINICAL FACTORS:   Depression:   Anhedonia Comorbid alcohol abuse/dependence Hopelessness Impulsivity Insomnia Alcohol/Substance Abuse/Dependencies  Psychiatric Specialty Exam: Physical Exam  Constitutional: He is oriented to person, place, and time. He appears well-developed and well-nourished.  HENT:  Head: Normocephalic and atraumatic.  Eyes: Pupils are equal, round, and reactive to light.  Neck: Normal range of motion. Neck supple.  Cardiovascular: Normal rate and regular rhythm.   Respiratory: Effort normal.  Musculoskeletal: Normal range of motion.  Neurological: He is alert and oriented to person, place, and time.  Psychiatric: His speech is normal. His mood appears anxious. His affect is labile. He is actively hallucinating. He expresses impulsivity. He exhibits a depressed mood. He expresses suicidal ideation.    Review of Systems  Constitutional: Negative.   HENT: Negative.   Respiratory: Negative.   Cardiovascular: Negative.   Musculoskeletal: Negative.   Skin: Negative.   Neurological: Negative.   Psychiatric/Behavioral: Positive for depression, suicidal ideas, hallucinations and substance abuse. The patient is nervous/anxious and has insomnia.     Blood pressure 120/78, pulse 95, temperature 98 F (36.7 C), temperature source Oral, resp. rate 18, height 5\' 8"  (1.727 m), weight 87.544 kg (193 lb).Body mass index is 29.35 kg/(m^2).  General  Appearance: Casual  Eye Contact::  Fair  Speech:  Normal Rate  Volume:  Normal  Mood:  Anxious, Depressed and Irritable  Affect:  Labile  Thought Process:  Coherent  Orientation:  Full (Time, Place, and Person)  Thought Content:  Hallucinations: Auditory, Ideas of Reference:   Paranoia Delusions and Paranoid Ideation  Suicidal Thoughts:  Yes.  without intent/plan passive SI,presented with SI with plan   Homicidal Thoughts:  Yes.  without intent/plan thoughts about hurting people in general  Memory:  Immediate;   Fair Recent;   Fair Remote;   Fair  Judgement:  Impaired  Insight:  Lacking  Psychomotor Activity:  Normal  Concentration:  Fair  Recall:  Norris: Fair  Akathisia:  No    AIMS (if indicated):   0  Assets:  Communication Skills  Sleep:      Musculoskeletal: Strength & Muscle Tone: within normal limits Gait & Station: normal Patient leans: N/A  COGNITIVE FEATURES THAT CONTRIBUTE TO RISK:  Closed-mindedness Polarized thinking    SUICIDE RISK:   Moderate:  Frequent suicidal ideation with limited intensity, and duration, some specificity in terms of plans, no associated intent, good self-control, limited dysphoria/symptomatology, some risk factors present, and identifiable protective factors, including available and accessible social support.  PLAN OF CARE:  I certify that inpatient services furnished can reasonably be expected to improve the patient's condition.  Meghen Akopyan 11/15/2013, 11:04 AM

## 2013-11-15 NOTE — Progress Notes (Signed)
Patient ID: John [name], male   DOB: 1970/09/30, 43 y.o.   MRN: [id number]  D: Pt. reports SI/HI and A/V Hallucinations. Patient contracts for safety while at this hospital and patient reports that the HI is not directed at a particular person. Patient reports that at times the voices tell him to take someone with him. Patient reports feeling depressed and hopeless. Patient did not fill out his daily inventory sheet today. Patient reports that he stopped taking his medications because he did not have his prescriptions and he did not have any because he forgot to follow up with [facility]. Patient does not report any pain or discomfort at this time. Patient is experiencing mild withdrawal symptoms at this time.  A: Support and encouragement provided to the patient to attend groups and work with his treatment plan. Scheduled medications administered to patient per physician's orders.   R: Patient is receptive and cooperative but minimal. Patient has mainly stayed in his room, laying in the bed. Q15 minute checks are maintained for safety.

## 2013-11-15 NOTE — [facility] Group Notes (Signed)
[facility] Notes:  (Nursing/MHT/Case Management/Adjunct)  Date:  11/15/2013  Time:  11:13 AM  Type of Therapy:  Nurse Education  Participation Level:  Did Not Attend  Participation Quality:  none  Affect:  None  Cognitive:  None  Insight:  None  Engagement in Group:  None  Modes of Intervention:  Discussion  Summary of Progress/Problems:   Riley Kill 11/15/2013, 11:13 AM

## 2013-11-15 NOTE — Plan of Care (Signed)
Problem: Alteration in mood & ability to function due to Goal: STG-Patient will report withdrawal symptoms Outcome: Progressing Patient is able to verbalize withdrawal symptoms and answer writer's CIWA questions.

## 2013-11-15 NOTE — [facility] Group Notes (Signed)
[facility] Notes:  (Counselor/Nursing/MHT/Case Management/Adjunct)  11/15/2013 1:15PM  Type of Therapy:  Group Therapy  Participation Level:  Active  Participation Quality:  Appropriate  Affect:  Flat  Cognitive:  Oriented  Insight:  Improving  Engagement in Group:  Limited  Engagement in Therapy:  Limited  Modes of Intervention:  Discussion, Exploration and Socialization  Summary of Progress/Problems: The topic for group was balance in life.  Pt participated in the discussion about when their life was in balance and out of balance and how this feels.  Pt discussed ways to get back in balance and short term goals they can work on to get where they want to be. Reluctant attender.  Sat with head against wall and eyes closed the entire time.  Even when asked directly, gave one word answers and refused to elaborate.   Roque Lias B 11/15/2013 3:00 PM

## 2013-11-15 NOTE — Tx Team (Signed)
Initial Interdisciplinary Treatment Plan   PATIENT STRESSORS: Financial difficulties Medication change or noncompliance Substance abuse   PROBLEM LIST: Problem List/Patient Goals Date to be addressed Date deferred Reason deferred Estimated date of resolution  "I need to get back on my medications because I am having suicidal and homicidal thoughts and hearing voices"      "I need to detox from alcohol and drugs"      Risk for self harm      Depression                                     DISCHARGE CRITERIA:  Ability to meet basic life and health needs Improved stabilization in mood, thinking, and/or behavior Motivation to continue treatment in a less acute level of care Reduction of life-threatening or endangering symptoms to within safe limits Verbal commitment to aftercare and medication compliance Withdrawal symptoms are absent or subacute and managed without 24-hour nursing intervention  PRELIMINARY DISCHARGE PLAN: Attend aftercare/continuing care group Attend PHP/IOP Outpatient therapy Return to previous living arrangement  PATIENT/FAMIILY INVOLVEMENT: This treatment plan has been presented to and reviewed with the patient, John [name], and/or family member.  The patient and family have been given the opportunity to ask questions and make suggestions.  Junius Finner Asante [facility] 11/15/2013, 6:04 AM

## 2013-11-15 NOTE — H&P (Signed)
Psychiatric Admission Assessment Adult  Patient Identification:  John [name] Date of Evaluation:  11/15/2013 Chief Complaint: " I am depressed and suicidal"  History of Present Illness:: Zailen Albarran is a 43 year old AAM who presented to [facility] with SI as well as depression. Patient on presentation reported SI with a plan to jump off a bridge. Patient also reported 2 previous suicide attempts in the past when he tried to cut self as well as tried to jump off of a bridge. Patient also reported AH x3 days asking him to "kill self", or "kill others".  Patient admitted to drinking ETOH heavily as well as using marijuana and cocaine . Patient was recently admitted to [facility] from 09/20/2013 -09/28/2013 for similar presentation. Patient reports that he was not compliant on his medications after DC ,since he ran out and lost his prescriptions. Patient on evaluation today also reported HI in general with no specific plan. Reports that likes the medications on which he was discharged and would like to be restarted on it.  Elements:  Location:  depression ,psychosis,etoh.cocaine,marijuana abuse. Quality:  feels sad,irritable ,sleep issues ,has AH asking him to kill self and others,passive SI/HI. Severity:  severe. Timing:  constant. Duration:  regular. Context:  has MDD ,noncompliant on meds,substance use disorder. Associated Signs/Synptoms: Depression Symptoms:  depressed mood, anhedonia, insomnia, feelings of worthlessness/guilt, difficulty concentrating, hopelessness, recurrent thoughts of death, suicidal thoughts without plan, suicidal attempt, (Anxiety Symptoms:  Excessive Worry, Psychotic Symptoms:  Delusions, Hallucinations: Auditory Ideas of Reference, PTSD Symptoms: Negative Total Time spent with patient: 1 hour  Psychiatric Specialty Exam: Physical Exam  Constitutional: He is oriented to person, place, and time. He appears well-developed and well-nourished.  HENT:  Head:  Normocephalic and atraumatic.  Eyes: Conjunctivae are normal. Pupils are equal, round, and reactive to light.  Neck: Normal range of motion. Neck supple.  Cardiovascular: Normal rate and regular rhythm.   Respiratory: Effort normal and breath sounds normal.  GI: Soft.  Musculoskeletal: Normal range of motion.  Neurological: He is alert and oriented to person, place, and time.  Skin: Skin is warm.  Psychiatric: His speech is normal. His mood appears anxious. His affect is labile. He is actively hallucinating. Thought content is paranoid and delusional. Cognition and memory are normal. He expresses impulsivity. He exhibits a depressed mood. He expresses homicidal and suicidal ideation.    Review of Systems  Constitutional: Negative.   HENT: Negative.   Eyes: Negative.   Respiratory: Negative.   Cardiovascular: Negative.   Gastrointestinal: Negative.   Genitourinary: Negative.   Musculoskeletal: Negative.   Skin: Negative.   Neurological: Negative.   Psychiatric/Behavioral: Positive for depression, suicidal ideas, hallucinations and substance abuse. The patient is nervous/anxious and has insomnia.     Blood pressure 120/78, pulse 95, temperature 98 F (36.7 C), temperature source Oral, resp. rate 18, height _0  (1.727 m), weight 87.544 kg (193 lb).Body mass index is 29.35 kg/(m^2).  General Appearance: Casual  Eye Contact::  Fair  Speech:  Normal Rate  Volume:  Normal  Mood:  Anxious, Depressed, Hopeless and Irritable  Affect:  Labile  Thought Process:  Irrelevant  Orientation:  Full (Time, Place, and Person)  Thought Content:  Delusions, Hallucinations: Auditory, Ideas of Reference:   Paranoia Delusions and Paranoid Ideation  Suicidal Thoughts:  Yes.  without intent/plan-presented with plan but denies now  Homicidal Thoughts:  Yes.  without intent/plan has generalized thoughts of hurting people  Memory:  Immediate;   Fair Recent;   Fair  Remote;   Fair  Judgement:  Impaired   Insight:  Lacking  Psychomotor Activity:  Normal  Concentration:  Poor  Recall:  [employer] of Knowledge:Fair  Language: Fair  Akathisia:  No    AIMS (if indicated):   0  Assets:  Communication Skills  Sleep:       Musculoskeletal: Strength & Muscle Tone: within normal limits Gait & Station: normal Patient leans: N/A  Past Psychiatric History: Diagnosis:MDD ,substance abuse  Hospitalizations:[facility] X2  Outpatient Care:[facility] (but reports forgetting his last appointment)  Substance Abuse Care:denies  Self-Mutilation:denies  Suicidal Attempts:x2 ,tried to slit wrist ,jump of a bridge  Violent Behaviors:denies   Past Medical History:   Past Medical History  Diagnosis Date  . Asthma   . HIV (human immunodeficiency virus infection)     "dx'd ~ 2 yr ago" (09/29/2012)  . Mental disorder   . Anxiety   . Depression   . Chronic low back pain   . Alcohol abuse   . HIV disease   . Hypertension   . Asthma    None. Allergies:   Allergies  Allergen Reactions  . Shellfish Allergy Anaphylaxis and Swelling  . Shellfish Allergy Swelling   PTA Medications: Prescriptions prior to admission  Medication Sig Dispense Refill  . albuterol (PROVENTIL HFA;VENTOLIN HFA) 108 (90 BASE) MCG/ACT inhaler Inhale 2 puffs into the lungs every 4 (four) hours as needed for wheezing or shortness of breath.      . mirtazapine (REMERON) 30 MG tablet Take 1 tablet (30 mg total) by mouth at bedtime. For sleep and depression.  30 tablet  0  . QUEtiapine (SEROQUEL) 400 MG tablet Take 1 tablet (400 mg total) by mouth at bedtime.  30 tablet  0  . traZODone (DESYREL) 50 MG tablet Take 1 tablet (50 mg total) by mouth at bedtime as needed for sleep.  30 tablet  0    Previous Psychotropic Medications:  Medication/Dose  Remeron ,seroquel,trazodone,risperidone,haldol,ambien,wellbutrin               Substance Abuse History in the last 12 months:  Yes.    Consequences of Substance  Abuse: Negative  Social History:  reports that he has been smoking Cigarettes.  He has a 13.5 pack-year smoking history. He does not have any smokeless tobacco history on file. He reports that he drinks about 29.4 ounces of alcohol per week. He reports that he uses illicit drugs (Marijuana and Cocaine) about 7 times per week. Additional Social History: Pain Medications: See home med list Prescriptions: See home med list Over the Counter: See home med list History of alcohol / drug use?: Yes Longest period of sobriety (when/how long): none Negative Consequences of Use: Financial;Personal relationships;Work / Youth worker Withdrawal Symptoms: Other (Comment) (Pt denies current withdrawal symptoms) Name of Substance 1: alcohol (alcohol) 1 - Age of First Use: teens 1 - Amount (size/oz): up to 6 40's 1 - Frequency: daily 1 - Duration: ongoing 1 - Last Use / Amount: 11/14/13 Name of Substance 2: THC 2 - Age of First Use: teens 2 - Amount (size/oz): varies 2 - Frequency: daily 2 - Duration: ongoing 2 - Last Use / Amount: 11/14/13 Name of Substance 3: cocaine 3 - Age of First Use: 20's 3 - Amount (size/oz): varies 3 - Frequency: when I can get it 3 - Duration: ongoing 3 - Last Use / Amount: don't remember    Current Place of Residence:  [place] parents,brother Marital Status:  Single  Children:none Relationships:none Education:  9th grade Educational Problems/Performance:none reported Religious Beliefs/Practices:yes ,believes in god History of Abuse (Emotional/Phsycial/Sexual)-denies Occupational Experiences;unemployed Military History:  None. Legal History:was in jail in past for misdemeanor ,no charges pending per report Hobbies/Interests:denies  Family History:   Family History  Problem Relation Age of Onset  . Alcoholism Mother   . Alcoholism Brother     Results for orders placed during the hospital encounter of 11/14/13 (from the past 72 hour(s))  URINE  RAPID DRUG SCREEN (HOSP PERFORMED)     Status: Abnormal   Collection Time    11/14/13  5:19 PM      Result Value Ref Range   Opiates NONE DETECTED  NONE DETECTED   Cocaine POSITIVE (*) NONE DETECTED   Benzodiazepines NONE DETECTED  NONE DETECTED   Amphetamines NONE DETECTED  NONE DETECTED   Tetrahydrocannabinol POSITIVE (*) NONE DETECTED   Barbiturates NONE DETECTED  NONE DETECTED   Comment:            DRUG SCREEN FOR MEDICAL PURPOSES     ONLY.  IF CONFIRMATION IS NEEDED     FOR ANY PURPOSE, NOTIFY LAB     WITHIN 5 DAYS.                LOWEST DETECTABLE LIMITS     FOR URINE DRUG SCREEN     Drug Class       Cutoff (ng/mL)     Amphetamine      1000     Barbiturate      200     Benzodiazepine   269     Tricyclics       485     Opiates          300     Cocaine          300     THC              50  URINALYSIS, ROUTINE W REFLEX MICROSCOPIC     Status: None   Collection Time    11/14/13  5:19 PM      Result Value Ref Range   Color, Urine YELLOW  YELLOW   APPearance CLEAR  CLEAR   Specific Gravity, Urine 1.018  1.005 - 1.030   pH 5.0  5.0 - 8.0   Glucose, UA NEGATIVE  NEGATIVE mg/dL   Hgb urine dipstick NEGATIVE  NEGATIVE   Bilirubin Urine NEGATIVE  NEGATIVE   Ketones, ur NEGATIVE  NEGATIVE mg/dL   Protein, ur NEGATIVE  NEGATIVE mg/dL   Urobilinogen, UA 0.2  0.0 - 1.0 mg/dL   Nitrite NEGATIVE  NEGATIVE   Leukocytes, UA NEGATIVE  NEGATIVE   Comment: MICROSCOPIC NOT DONE ON URINES WITH NEGATIVE PROTEIN, BLOOD, LEUKOCYTES, NITRITE, OR GLUCOSE <1000 mg/dL.  ACETAMINOPHEN LEVEL     Status: None   Collection Time    11/14/13  5:26 PM      Result Value Ref Range   Acetaminophen (Tylenol), Serum <15.0  10 - 30 ug/mL   Comment:            THERAPEUTIC CONCENTRATIONS VARY     SIGNIFICANTLY. A RANGE OF 10-30     ug/mL MAY BE AN EFFECTIVE     CONCENTRATION FOR MANY PATIENTS.     HOWEVER, SOME ARE BEST TREATED     AT CONCENTRATIONS OUTSIDE THIS     RANGE.     ACETAMINOPHEN  CONCENTRATIONS     >150 ug/mL AT 4 HOURS AFTER  INGESTION AND >50 ug/mL AT 12     HOURS AFTER INGESTION ARE     OFTEN ASSOCIATED WITH TOXIC     REACTIONS.  CBC     Status: None   Collection Time    11/14/13  5:26 PM      Result Value Ref Range   WBC 5.4  4.0 - 10.5 K/uL   RBC 4.35  4.22 - 5.81 MIL/uL   Hemoglobin 13.6  13.0 - 17.0 g/dL   HCT 40.0  39.0 - 52.0 %   MCV 92.0  78.0 - 100.0 fL   MCH 31.3  26.0 - 34.0 pg   MCHC 34.0  30.0 - 36.0 g/dL   RDW 13.7  11.5 - 15.5 %   Platelets 322  150 - 400 K/uL  COMPREHENSIVE METABOLIC PANEL     Status: Abnormal   Collection Time    11/14/13  5:26 PM      Result Value Ref Range   Sodium 144  137 - 147 mEq/L   Potassium 4.1  3.7 - 5.3 mEq/L   Chloride 104  96 - 112 mEq/L   CO2 20  19 - 32 mEq/L   Glucose, Bld 83  70 - 99 mg/dL   BUN 10  6 - 23 mg/dL   Creatinine, Ser 1.07  0.50 - 1.35 mg/dL   Calcium 9.0  8.4 - 10.5 mg/dL   Total Protein 8.2  6.0 - 8.3 g/dL   Albumin 4.2  3.5 - 5.2 g/dL   AST 24  0 - 37 U/L   ALT 18  0 - 53 U/L   Alkaline Phosphatase 78  39 - 117 U/L   Total Bilirubin 0.5  0.3 - 1.2 mg/dL   GFR calc non Af Amer 84 (*) >90 mL/min   GFR calc Af Amer >90  >90 mL/min   Comment: (NOTE)     The eGFR has been calculated using the CKD EPI equation.     This calculation has not been validated in all clinical situations.     eGFR's persistently <90 mL/min signify possible Chronic Kidney     Disease.   Anion gap 20 (*) 5 - 15  ETHANOL     Status: Abnormal   Collection Time    11/14/13  5:26 PM      Result Value Ref Range   Alcohol, Ethyl (B) 246 (*) 0 - 11 mg/dL   Comment:            LOWEST DETECTABLE LIMIT FOR     SERUM ALCOHOL IS 11 mg/dL     FOR MEDICAL PURPOSES ONLY  SALICYLATE LEVEL     Status: Abnormal   Collection Time    11/14/13  5:26 PM      Result Value Ref Range   Salicylate Lvl <2.7 (*) 2.8 - 20.0 mg/dL  HEPATITIS PANEL, ACUTE     Status: None   Collection Time    11/14/13  5:28 PM      Result  Value Ref Range   Hepatitis B Surface Ag NEGATIVE  NEGATIVE   HCV Ab NEGATIVE  NEGATIVE   Hep A IgM NON REACTIVE  NON REACTIVE   Hep B C IgM NON REACTIVE  NON REACTIVE   Comment: (NOTE)     High levels of Hepatitis B Core IgM antibody are detectable     during the acute stage of Hepatitis B. This antibody is used     to differentiate current from past HBV  infection.     Performed at [facility]     Status: Abnormal   Collection Time    11/14/13  5:28 PM      Result Value Ref Range   Neutrophils Relative % 26 (*) 43 - 77 %   Neutro Abs 1.3 (*) 1.7 - 7.7 K/uL   Lymphocytes Relative 62 (*) 12 - 46 %   Lymphs Abs 3.3  0.7 - 4.0 K/uL   Monocytes Relative 9  3 - 12 %   Monocytes Absolute 0.5  0.1 - 1.0 K/uL   Eosinophils Relative 3  0 - 5 %   Eosinophils Absolute 0.1  0.0 - 0.7 K/uL   Basophils Relative 0  0 - 1 %   Basophils Absolute 0.0  0.0 - 0.1 K/uL  LIPASE, BLOOD     Status: None   Collection Time    11/14/13  5:28 PM      Result Value Ref Range   Lipase 12  11 - 59 U/L   Psychological Evaluations:none  Assessment:   DSM5: Primary psychiatric diagnosis: Major depressive disorder,recurrent severe with psychosis  Secondary psychiatric diagnosis: Alcohol use disorder Cannabis use disorder Stimulant (cocaine) use disoder Tobacco use disorder  Non psychiatric diagnosis: Asthma HIV (Dxed in 2014) Gout Chronic low back pain    Past Medical History  Diagnosis Date  . Asthma   . HIV (human immunodeficiency virus infection)     "dx'd ~ 2 yr ago" (09/29/2012)  . Mental disorder   . Anxiety   . Depression   . Chronic low back pain   . Alcohol abuse   . HIV disease   . Hypertension   . Asthma     Treatment Plan/Recommendations:   Patient will benefit from inpatient treatment and stabilization.  Estimated length of stay is 5-7 days.  Reviewed past medical records,treatment plan.  Will continue to monitor vitals ,medication compliance and  treatment side effects while patient is here.  Will monitor for medical issues as well as call consult as needed.  Reviewed labs ,will order as needed.  CSW will start working on disposition.  Patient to participate in therapeutic milieu .   Will start patient on CIWA as well as Librium protocol for alcohol withdrawal symptoms.Patient has significant substance use problem- Will refer to substance abuse program after stabilization.  Ref. Range 05/07/2013 23:18 05/23/2013 12:24 07/24/2013 01:56 10/12/2013 04:40 11/14/2013 17:26  Alcohol, Ethyl (B) Latest Range: 0-11 mg/dL 133 (H) <11 212 (H) 195 (H) 246 (H)   Will restart patient on his medications ,since he reports good efficacy on them ,but was noncompliant secondary to not filling his prescription. Will restart Remeron 30 mg po qhs for affective issues. Will restart Seroquel XR 400 mg po qhs for psychosis,mood lability,sleep. Will restart Trazodone 50 mg po qhs prn for sleep. Will make prn's available for anxiety sx.  FOR HIV -patient used to be a part of START study per ehr. Will provide nicotine patch for smoking cessation.    Treatment Plan Summary: Daily contact with patient to assess and evaluate symptoms and progress in treatment Medication management Current Medications:  Current Facility-Administered Medications  Medication Dose Route Frequency Provider Last Rate Last Dose  . acetaminophen (TYLENOL) tablet 650 mg  650 mg Oral Q6H PRN Laverle Hobby, PA-C      . albuterol (PROVENTIL HFA;VENTOLIN HFA) 108 (90 BASE) MCG/ACT inhaler 2 puff  2 puff Inhalation Q4H PRN Ursula Alert, MD      .  alum & mag hydroxide-simeth (MAALOX/MYLANTA) 200-200-20 MG/5ML suspension 30 mL  30 mL Oral Q4H PRN Laverle Hobby, PA-C      . chlordiazePOXIDE (LIBRIUM) capsule 25 mg  25 mg Oral Q6H PRN Ursula Alert, MD      . chlordiazePOXIDE (LIBRIUM) capsule 25 mg  25 mg Oral QID Ursula Alert, MD   25 mg at 11/15/13 0954   Followed by  . [START ON  11/16/2013] chlordiazePOXIDE (LIBRIUM) capsule 25 mg  25 mg Oral TID Ursula Alert, MD       Followed by  . [START ON 11/17/2013] chlordiazePOXIDE (LIBRIUM) capsule 25 mg  25 mg Oral BH-qamhs Ursula Alert, MD       Followed by  . [START ON 11/18/2013] chlordiazePOXIDE (LIBRIUM) capsule 25 mg  25 mg Oral Daily Amritha Yorke, MD      . hydrOXYzine (ATARAX/VISTARIL) tablet 25 mg  25 mg Oral Q6H PRN Thornton Dohrmann, MD      . loperamide (IMODIUM) capsule 2-4 mg  2-4 mg Oral PRN Ursula Alert, MD      . magnesium hydroxide (MILK OF MAGNESIA) suspension 30 mL  30 mL Oral Daily PRN Laverle Hobby, PA-C      . mirtazapine (REMERON) tablet 30 mg  30 mg Oral QHS Ragan Reale, MD      . multivitamin with minerals tablet 1 tablet  1 tablet Oral Daily Ursula Alert, MD   1 tablet at 11/15/13 0954  . nicotine (NICODERM CQ - dosed in mg/24 hours) patch 14 mg  14 mg Transdermal Daily Laverle Hobby, PA-C   14 mg at 11/15/13 0859  . ondansetron (ZOFRAN-ODT) disintegrating tablet 4 mg  4 mg Oral Q6H PRN Ursula Alert, MD      . QUEtiapine (SEROQUEL XR) 24 hr tablet 400 mg  400 mg Oral QHS Amberia Bayless, MD      . thiamine (B-1) injection 100 mg  100 mg Intramuscular Once Ursula Alert, MD      . Derrill Memo ON 11/16/2013] thiamine (VITAMIN B-1) tablet 100 mg  100 mg Oral Daily Davy Westmoreland, MD      . traZODone (DESYREL) tablet 50 mg  50 mg Oral QHS PRN Ursula Alert, MD        Observation Level/Precautions:  15 minute checks  Laboratory:  reviewed labs  Psychotherapy:    Medications:    Consultations:    Discharge Concerns:    Estimated LOS:  Other:     I certify that inpatient services furnished can reasonably be expected to improve the patient's condition.   Ashantia Amaral 9/3/201511:08 AM

## 2013-11-15 NOTE — BH Assessment (Signed)
Tele Assessment Note   John [name] is a 43 y.o. male who voluntarily presents to [facility] with SI/SA/Depression.  Pt states he's been SI x2days with a plan to jump from a bridge.  Pt reports 2 previous SI attempts by jumping from bridge and parking deck.  Pt also reports HI and AH x2days, he has no plan and no specific person(s) he wants to harm.  Pt says say are with command. This Probation officer inquired about stressors attributing to pt.'s current mental state, pt replied--"i'm stressing me out".  Pt says he's been struggling with depression since his teens.    Pt admits he drinks 1-40oz beer, daily. His last use was 11/14/13, he drank 1-40oz beer.  He uses marijuana, daily, consuming a "nickel bag" every day.  His last use was 11/14/13, he used a "nickel bag"; he also uses cocaine(occasionally).     Axis I: Major Depression, Recurrent severe and Substance Abuse Axis II: Deferred Axis III:  Past Medical History  Diagnosis Date  . Asthma   . HIV (human immunodeficiency virus infection)     "dx'd ~ 2 yr ago" (09/29/2012)  . Mental disorder   . Anxiety   . Depression   . Chronic low back pain   . Alcohol abuse   . HIV disease   . Hypertension   . Asthma    Axis IV: other psychosocial or environmental problems, problems related to social environment, problems with access to health care services and problems with primary support group Axis V: 31-40 impairment in reality testing  Past Medical History:  Past Medical History  Diagnosis Date  . Asthma   . HIV (human immunodeficiency virus infection)     "dx'd ~ 2 yr ago" (09/29/2012)  . Mental disorder   . Anxiety   . Depression   . Chronic low back pain   . Alcohol abuse   . HIV disease   . Hypertension   . Asthma     Past Surgical History  Procedure Laterality Date  . Skin graft full thickness leg Left ?    POSTERIOR LEFT LEG  AFTER DOG BITES    Family History: History reviewed. No pertinent family history.  Social History:   reports that he has been smoking Cigarettes.  He has a 13.5 pack-year smoking history. He does not have any smokeless tobacco history on file. He reports that he drinks about 29.4 ounces of alcohol per week. He reports that he uses illicit drugs (Marijuana and Cocaine) about 7 times per week.  Additional Social History:  Alcohol / Drug Use Pain Medications: See MAR  Prescriptions: See MAR  Over the Counter: See MAR  History of alcohol / drug use?: Yes Longest period of sobriety (when/how long): None  Negative Consequences of Use: Work / School;Personal relationships;Financial Withdrawal Symptoms: Other (Comment) (No current w/d sxs ) Substance #1 Name of Substance 1: Alcohol  1 - Age of First Use: Teens  1 - Amount (size/oz): 1-40oz  1 - Frequency: Daily  1 - Duration: On-going  1 - Last Use / Amount: 11/14/13 Substance #2 Name of Substance 2: THC  2 - Age of First Use: Teens  2 - Amount (size/oz): "Nickle Bag"  2 - Frequency: Daily  2 - Duration: On-going  2 - Last Use / Amount: 11/14/13 Substance #3 Name of Substance 3: Cocaine  3 - Age of First Use: 20's  3 - Amount (size/oz): Varies  3 - Frequency: "Occasional use" 3 - Duration: On-going  3 -  Last Use / Amount: Unk  CIWA: CIWA-Ar BP: 116/75 mmHg Pulse Rate: 62 Nausea and Vomiting: no nausea and no vomiting Tactile Disturbances: none Tremor: no tremor Auditory Disturbances: not present Paroxysmal Sweats: barely perceptible sweating, palms moist Visual Disturbances: not present Anxiety: no anxiety, at ease Headache, Fullness in Head: none present Agitation: somewhat more than normal activity Orientation and Clouding of Sensorium: oriented and can do serial additions CIWA-Ar Total: 2 COWS:    PATIENT STRENGTHS: (choose at least two) Capable of independent living Motivation for treatment/growth  Allergies:  Allergies  Allergen Reactions  . Shellfish Allergy Anaphylaxis and Swelling  . Shellfish Allergy Swelling     Home Medications:  (Not in a hospital admission)  OB/GYN Status:  No LMP for male patient.  General Assessment Data Location of Assessment: [facility] ED Is this a Tele or Face-to-Face Assessment?: Tele Assessment Is this an Initial Assessment or a Re-assessment for this encounter?: Initial Assessment Living Arrangements: Non-relatives/Friends Can pt return to current living arrangement?: Yes Admission Status: Voluntary Is patient capable of signing voluntary admission?: Yes Transfer from: [facility] Referral Source: MD  Medical Screening Exam ([place]) Medical Exam completed: No Reason for MSE not completed: Other: (None )  [place] Living Arrangements: Non-relatives/Friends Name of Psychiatrist: [place]  Name of Therapist: [facility]   Education Status Is patient currently in school?: No Current Grade: None  Highest grade of school patient has completed: None  Name of school: None  Contact person: None   Risk to self with the past 6 months Suicidal Ideation: Yes-Currently Present Suicidal Intent: Yes-Currently Present Is patient at risk for suicide?: Yes Suicidal Plan?: Yes-Currently Present Specify Current Suicidal Plan: Jump off a bridge  Access to Means: Yes Specify Access to Suicidal Means: Bridges--[place] locations  What has been your use of drugs/alcohol within the last 12 months?: Abusingc: alcohol; THC  Previous Attempts/Gestures: Yes How many times?: 2 Other Self Harm Risks: None  Triggers for Past Attempts: Unpredictable Intentional Self Injurious Behavior: None Comment - Self Injurious Behavior: None  Family Suicide History: No Recent stressful life event(s): Recent negative physical changes (Off meds; Chronic mental illness ) Persecutory voices/beliefs?: No Depression: Yes Depression Symptoms: Feeling angry/irritable;Loss of interest in usual pleasures;Feeling worthless/self pity;Fatigue Substance abuse history and/or treatment  for substance abuse?: Yes Suicide prevention information given to non-admitted patients: Not applicable  Risk to Others within the past 6 months Homicidal Ideation: Yes-Currently Present Thoughts of Harm to Others: Yes-Currently Present Comment - Thoughts of Harm to Others: No specific reason or person(s); command from voices  Current Homicidal Intent: No Current Homicidal Plan: No Access to Homicidal Means: No Identified Victim: Non specific  History of harm to others?: No Assessment of Violence: None Noted Violent Behavior Description: None  Does patient have access to weapons?: No Criminal Charges Pending?: No Does patient have a court date: No  Psychosis Hallucinations: Auditory;With command (To harm self and others ) Delusions: None noted  Mental Status Report Appear/Hygiene: Disheveled;Body odor;In scrubs Eye Contact: Poor Motor Activity: Unremarkable Speech: Logical/coherent;Soft Level of Consciousness: Alert Mood: Depressed;Irritable Affect: Depressed;Irritable Anxiety Level: None Thought Processes: Coherent;Relevant Judgement: Impaired Orientation: Person;Place;Time;Situation Obsessive Compulsive Thoughts/Behaviors: None  Cognitive Functioning Concentration: Normal Memory: Recent Intact;Remote Intact IQ: Average Insight: Poor Impulse Control: Poor Appetite: Poor Weight Loss: 23 Weight Gain: 0 Sleep: Decreased Total Hours of Sleep:  (Pt reports no sleep in several days ) Vegetative Symptoms: None  ADLScreening [facility] Assessment Services) Patient's cognitive ability adequate to safely  complete daily activities?: Yes Patient able to express need for assistance with ADLs?: Yes Independently performs ADLs?: Yes (appropriate for developmental age)  Prior Inpatient Therapy Prior Inpatient Therapy: Yes Prior Therapy Dates: 2014,2015 Prior Therapy Facilty/Provider(s): [facility], [facility]  Reason for Treatment: SA/Depression/SI   Prior Outpatient Therapy Prior  Outpatient Therapy: Yes Prior Therapy Dates: Current  Prior Therapy Facilty/Provider(s): [facility]  Reason for Treatment: [facility]   ADL Screening (condition at time of admission) Patient's cognitive ability adequate to safely complete daily activities?: Yes Is the patient deaf or have difficulty hearing?: No Does the patient have difficulty seeing, even when wearing glasses/contacts?: No Does the patient have difficulty concentrating, remembering, or making decisions?: No Patient able to express need for assistance with ADLs?: Yes Does the patient have difficulty dressing or bathing?: No Independently performs ADLs?: Yes (appropriate for developmental age) Does the patient have difficulty walking or climbing stairs?: No Weakness of Legs: None Weakness of Arms/Hands: None  Home Assistive Devices/Equipment Home Assistive Devices/Equipment: None  Therapy Consults (therapy consults require a physician order) PT Evaluation Needed: No OT Evalulation Needed: No SLP Evaluation Needed: No Abuse/Neglect Assessment (Assessment to be complete while patient is alone) Physical Abuse: Denies Verbal Abuse: Denies Sexual Abuse: Denies Exploitation of patient/patient's resources: Denies Self-Neglect: Denies Values / Beliefs Cultural Requests During Hospitalization: None Spiritual Requests During Hospitalization: None Consults Spiritual Care Consult Needed: No Social Work Consult Needed: No Regulatory affairs officer (For Healthcare) Does patient have an advance directive?: No Would patient like information on creating an advanced directive?: No - patient declined information Nutrition Screen- [facility] Patient's home diet: Regular  Additional Information 1:1 In Past 12 Months?: No CIRT Risk: No Elopement Risk: No Does patient have medical clearance?: Yes     Disposition:  Disposition Initial Assessment Completed for this Encounter: Yes Disposition of Patient: Inpatient treatment  program;Referred to (Aceepted by Patriciaann Clan, PA; [phone]) Type of inpatient treatment program: Adult Patient referred to: Other (Comment) (Accepted by Patriciaann Clan, PA; [phone])  [place] Rm 11/15/2013 4:58 AM

## 2013-11-15 NOTE — ED Provider Notes (Signed)
Medical screening examination/treatment/procedure(s) were performed by non-physician practitioner and as supervising physician I was immediately available for consultation/collaboration.   EKG Interpretation None        Malvin Johns, MD 11/15/13 [phone]

## 2013-11-15 NOTE — Progress Notes (Signed)
Vol admit, 43 yo AA male, who presented to the ED with c/o depression and suicidal thoughts.  He said he has been off his medications for 3 weeks saying that he was tired of taking so many pills.  He was dx with HIV 2 yrs ago.  He also has a medical hx of asthma, HTN, chronic back pain.  Pt says he has been hearing voices telling him to hurt and kill himself and other people.  Pt had a plan to jump off a bridge orcut himself.  He also reports he has had visual hallucinations of pink butterflies and elephants.  Pt reports he is still having thoughts to hurt himself, but he contracts with Probation officer for safety.  Pt was cooperative with the admission process, although he was drowsy from the medications given at the ED prior to his transfer to [facility].  Pt was oriented to unit/room.  Safety checks q15 minutes were initiated.

## 2013-11-15 NOTE — Progress Notes (Signed)
D: Pt remained in room asleep for the majority of shift. Pt presented blunted in affect and depressed in mood. Pt denies any withdrawal symptoms. Pt endorses AH/SI/HI. Pt denies any plan for self harm. Pt verbally contracts for safety. Pt reports no actual intentions to harm anyone at this time. He denies any specific targets. Pt was hypertensive but asymptomatic.  A: Writer administered scheduled and prn medications to pt. Continued support and availability as needed was extended to this pt. Staff continue to monitor pt with q37min checks.  R: No adverse drug reactions noted. Pt receptive to treatment. Pt remains safe at this time.

## 2013-11-16 MED ORDER — ENSURE COMPLETE PO LIQD
237.0000 mL | Freq: Two times a day (BID) | ORAL | Status: DC
  Administered 2013-11-16 – 2013-11-25 (×11): 237 mL via ORAL

## 2013-11-16 MED ORDER — GABAPENTIN 100 MG PO CAPS
200.0000 mg | ORAL_CAPSULE | Freq: Two times a day (BID) | ORAL | Status: DC
  Administered 2013-11-16 – 2013-11-19 (×6): 200 mg via ORAL
  Filled 2013-11-16 (×12): qty 2

## 2013-11-16 MED ORDER — TRAZODONE HCL 50 MG PO TABS
50.0000 mg | ORAL_TABLET | Freq: Every day | ORAL | Status: DC
  Administered 2013-11-16 – 2013-11-18 (×3): 50 mg via ORAL
  Filled 2013-11-16 (×5): qty 1

## 2013-11-16 MED ORDER — LORAZEPAM 1 MG PO TABS
1.0000 mg | ORAL_TABLET | Freq: Four times a day (QID) | ORAL | Status: DC | PRN
  Administered 2013-11-17 – 2013-11-19 (×3): 1 mg via ORAL
  Filled 2013-11-16 (×3): qty 1

## 2013-11-16 NOTE — Progress Notes (Signed)
NUTRITION ASSESSMENT  Pt identified as at risk on the Malnutrition Screen Tool  INTERVENTION: 1. Educated patient on the importance of nutrition and encouraged intake of food and beverages. 2. Discussed weight goals. 3. Supplements: Ensure Complete BID  NUTRITION DIAGNOSIS: Unintentional weight loss related to sub-optimal intake as evidenced by pt report.   Goal: Pt to meet >/= 90% of their estimated nutrition needs.  Monitor:  PO intake  Assessment:  Patient admitted with depression, history of HIV. He is currently homeless. Recently admitted 2 months ago. Appetite slightly improved, but fair. His weight is up 2 pounds since then, but still down 7% from April.   43 y.o. male  Height: Ht Readings from Last 1 Encounters:  11/15/13 5\' 8"  (1.727 m)    Weight: Wt Readings from Last 1 Encounters:  11/15/13 193 lb (87.544 kg)    Weight Hx: Wt Readings from Last 10 Encounters:  11/15/13 193 lb (87.544 kg)  10/12/13 135 lb (61.236 kg)  09/20/13 191 lb 8 oz (86.864 kg)  08/17/13 196 lb 12 oz (89.245 kg)  06/14/13 207 lb (93.895 kg)  05/23/13 185 lb (83.915 kg)  06/01/13 148 lb (67.132 kg)  05/08/13 185 lb (83.915 kg)  05/07/13 190 lb (86.183 kg)  04/18/13 189 lb 12 oz (86.07 kg)    BMI:  Body mass index is 29.35 kg/(m^2). Pt meets criteria for overweight based on current BMI.  Estimated Nutritional Needs: Kcal: 25-30 kcal/kg Protein: > 1 gram protein/kg Fluid: 1 ml/kcal  Diet Order: General Pt is also offered choice of unit snacks mid-morning and mid-afternoon.  Pt is eating as desired.   Lab results and medications reviewed.   Larey Seat, RD, LDN Pager #: [phone] After-Hours Pager #: [phone]
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
## 2013-11-16 NOTE — Progress Notes (Signed)
Patient ID: John [name], male   DOB: Mar 28, 1970, 43 y.o.   MRN: [id number] 1- 1 Monitoring Note. D. Pt 1.1. Monitoring for safety/self injury. A. Staff remain at bedside. Pt is in no acute distress at this time. R. Patient is safe. Will continue to monitor as ordered.

## 2013-11-16 NOTE — BHH Suicide Risk Assessment (Signed)
[place] INPATIENT:  Family/Significant Other Suicide Prevention Education  Suicide Prevention Education:  Contact Attempts: Bevelyn Buckles (pt's aunt) [phone] has been identified by the patient as the family member/significant other with whom the patient will be residing, and identified as the person(s) who will aid the patient in the event of a mental health crisis.  With written consent from the patient, two attempts were made to provide suicide prevention education, prior to and/or following the patient's discharge.  We were unsuccessful in providing suicide prevention education.  A suicide education pamphlet was given to the patient to share with family/significant other.  Date and time of first attempt: 11/16/13 10:00AM (generic voicemail left requesting call back at earliest convenience)   Smart, Alicia Amel  11/16/2013, 10:02 AM  Date and time of second attempt: 11/20/13 at 10:40AM. Andie picked up and SPE completed with her. Andie verbalized understanding of all information and is aware of pt's aftercare plan. Pt's aunt stated that Merry Proud will move in with her when he d/c's from the hospital and she reports there is no access to firearms.  [place], [place] 11/20/2013 10:45 AM

## 2013-11-16 NOTE — Progress Notes (Signed)
Patient ID: John [name], male   DOB: [birth year], 43 y.o.   MRN: [id number] D. Patient presents with irritable mood, affect labile. Patient agitated, stating '' I'm hearing voices, telling me to hurt others, and I'm anxious I need something. '' Patient unable to contract for safety, and orders received to place pt on 1.1. Monitoring. (see paper chart.) Patient was offered prn medications for withdrawal and agitation, provided gatorade and reassurance. A. Medications given as ordered. Discussed patient progress and orders received. R. Patient is safe. Will continue to monitor as ordered. (1.1. At this time) for safety.
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
## 2013-11-16 NOTE — BHH Group Notes (Signed)
[place] LCSW Group Therapy  11/16/2013  1:05 PM  Type of Therapy:  Group therapy  Participation Level:  Found pt in bed with covers pulled over head.  Invited to group.  "I have a headache."  Summary of Progress/Problems:  Chaplain was here to lead a group on themes of hope and courage.  Roque Lias B 11/16/2013 1:59 PM

## 2013-11-16 NOTE — Progress Notes (Signed)
Patient ID: John [name], male   DOB: August 31, 1970, 43 y.o.   MRN: [id number] 1- 1 Monitoring Note. D. Pt 1.1. Monitoring for safety/self injury. A. Staff remain with patient. Pt is in no acute distress at this time. R. Patient is safe. Will continue to monitor as ordered.

## 2013-11-16 NOTE — Progress Notes (Signed)
1-1 note: D. Pt in bed much of the evening hours, did not attend evening wrap up group. Pt did receive all medications without incident and did verbalize that he was still feeling suicidal. Pt reports that he is still feeling anxious and irritable as well as still having thoughts of wanting to hurt himself but would not contract for safety. A. Support and encouragement provided, 1-1 continued at this time. R. Safety maintained at this time, will continue to monitor.
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
## 2013-11-16 NOTE — BHH Group Notes (Signed)
[facility] LCSW Aftercare Discharge Planning Group Note   11/16/2013 10:19 AM  Participation Quality:  Minimal  Mood/Affect:  Depressed and Irritable  Depression Rating:  10  Anxiety Rating:  10  Thoughts of Suicide:  Yes Will you contract for safety?   Yes  Current AVH:  No  Plan for Discharge/Comments:  John [name] is irritable, surely, impatient and withholding of information.  Everything is a clipped one word answer.  Sleep-bad.  Appetite-poor.  Symptoms-everything.  Specifically?-Depression, anxiety, SI.   He asks "Can I go now?"  I answer no.  He stays while issuing sighs, murmurs, and other noises.   Transportation Means: unk  Supports: unk  [employer], Trinidad B

## 2013-11-17 DIAGNOSIS — F17200 Nicotine dependence, unspecified, uncomplicated: Secondary | ICD-10-CM

## 2013-11-17 DIAGNOSIS — R45851 Suicidal ideations: Secondary | ICD-10-CM

## 2013-11-17 DIAGNOSIS — F1010 Alcohol abuse, uncomplicated: Secondary | ICD-10-CM

## 2013-11-17 DIAGNOSIS — F333 Major depressive disorder, recurrent, severe with psychotic symptoms: Principal | ICD-10-CM

## 2013-11-17 DIAGNOSIS — F1410 Cocaine abuse, uncomplicated: Secondary | ICD-10-CM

## 2013-11-17 DIAGNOSIS — F1210 Cannabis abuse, uncomplicated: Secondary | ICD-10-CM

## 2013-11-17 NOTE — [facility]HH Group Notes (Signed)
[place] Group Notes:  (Nursing/MHT/Case Management/Adjunct)  Date:  11/17/2013  Time:  10:37 AM  Type of Therapy:  Psychoeducational Skills  Participation Level:  Did Not Attend  Participation Quality:  na  Affect:  na  Cognitive:  na  Insight:  None  Engagement in Group:  na  Modes of Intervention:  na  Summary of Progress/Problems:  Leonia Reader 11/17/2013, 10:37 AM

## 2013-11-17 NOTE — BHH Group Notes (Signed)
[place] Group Notes:  (Clinical Social Work)  11/17/2013  11:15-12:00PM  Summary of Progress/Problems:   The main focus of today's process group was to discuss patients' feelings about hospitalization, the stigma attached to mental health, and sources of motivation to stay well.  We then worked to identify a specific plan to avoid future hospitalizations when discharged from the hospital for this admission.  The patient expressed that he feels pretty bad today (9 out of 10) and that he is in the hospital for "Bipolar Schizophrenia and suicidal thoughts."  He then sat through the remainder of group with his eyes closed, and did not make comments, refused to make comments when asked for his thoughts.  He was called out of group for awhile to see a practitioner, so was not present for all of group.  Type of Therapy:  Group Therapy - Process  Participation Level:  Minimal  Participation Quality:  Drowsy  Affect:  Flat  Cognitive:  Oriented  Insight:  Limited  Engagement in Therapy:  Lacking  Modes of Intervention:  Exploration, Discussion  Selmer Dominion, LCSW 11/17/2013, 12:57 PM

## 2013-11-17 NOTE — Progress Notes (Signed)
Patient ID: John [name], male   DOB: [birth year], 43 y.o.   MRN: [id number] [facility] MD Progress Note  11/17/2013 1:21 PM John [name]  MRN:  [id number] Subjective: " I am still feeling depressed and suicidal.''  Objective: Patient was seen and his chart reviewed. Patient reports ongoing mood swings, depression and  irritable mood. John [name] continues to report auditory hallucinations, '' voices are telling me to kill others and myself, and telling me nasty things. I'm still seeing things too, pink elephants and such.'' He is endorsing excessive worries, anxiety, paranoia and recurrent suicidal thoughts. He denies homicidal ideations. Patient has not been compliant with the unit milieu but he is compliant with his medications.  Patient to be placed on 1:1 for safety   Diagnosis:   DSM5: DSM5:  Primary psychiatric diagnosis:  Major depressive disorder,recurrent severe with psychosis   Secondary psychiatric diagnosis:  Alcohol use disorder  Cannabis use disorder  Stimulant (cocaine) use disoder  Tobacco use disorder   Non psychiatric diagnosis:  Asthma  HIV (Dxed in 2014)  Gout  Chronic low back pain     ADL's:  Impaired  Sleep: Poor  Appetite:  Fair  Suicidal Ideation:  Plan:  wants to bang his head to death Homicidal Ideation:  Denies   AEB (as evidenced by):  Psychiatric Specialty Exam: Physical Exam  Constitutional: He is oriented to person, place, and time. He appears well-developed and well-nourished.  HENT:  Head: Normocephalic and atraumatic.  Neurological: He is alert and oriented to person, place, and time.  Psychiatric: His speech is normal. His mood appears anxious. His affect is angry and labile. He is agitated, aggressive, actively hallucinating and combative. Thought content is paranoid. Cognition and memory are normal. He expresses impulsivity and inappropriate judgment. He exhibits a depressed mood. He expresses suicidal ideation. He expresses suicidal  plans.    Review of Systems  Constitutional: Negative.   HENT: Negative.   Eyes: Negative.   Cardiovascular: Negative.   Gastrointestinal: Negative.   Musculoskeletal: Positive for myalgias.  Skin: Negative.   Psychiatric/Behavioral: Positive for depression, suicidal ideas, hallucinations and substance abuse. The patient is nervous/anxious and has insomnia.     Blood pressure 137/101, pulse 119, temperature 98 F (36.7 C), temperature source Oral, resp. rate 18, height 5\' 8"  (1.727 m), weight 87.544 kg (193 lb).Body mass index is 29.35 kg/(m^2).  General Appearance: Disheveled  Eye Contact::  Minimal  Speech:  Normal Rate  Volume:  Increased  Mood:  Angry, Anxious, Depressed, Hopeless and Irritable  Affect:  Labile  Thought Process:  Linear  Orientation:  Full (Time, Place, and Person)  Thought Content:  Hallucinations: Auditory  Suicidal Thoughts:  Yes.  with intent/plan with thoughts to bang his head to death  Homicidal Thoughts:  No  Memory:  Immediate;   Fair Recent;   Fair Remote;   Fair  Judgement:  Impaired  Insight:  Lacking  Psychomotor Activity:  Increased  Concentration:  Fair  Recall:  [employer] of Knowledge:Fair  Language: Fair  Akathisia:  No    AIMS (if indicated):   0  Assets:  Others:  access to health care  Sleep:  Number of Hours: 6.75   Musculoskeletal: Strength & Muscle Tone: within normal limits Gait & Station: normal Patient leans: N/A  Current Medications: Current Facility-Administered Medications  Medication Dose Route Frequency Provider Last Rate Last Dose  . acetaminophen (TYLENOL) tablet 650 mg  650 mg Oral Q6H PRN Laverle Hobby, PA-C      .  albuterol (PROVENTIL HFA;VENTOLIN HFA) 108 (90 BASE) MCG/ACT inhaler 2 puff  2 puff Inhalation Q4H PRN Ursula Alert, MD   2 puff at 11/15/13 2227  . alum & mag hydroxide-simeth (MAALOX/MYLANTA) 200-200-20 MG/5ML suspension 30 mL  30 mL Oral Q4H PRN Laverle Hobby, PA-C      . chlordiazePOXIDE  (LIBRIUM) capsule 25 mg  25 mg Oral Q6H PRN Ursula Alert, MD   25 mg at 11/17/13 0819  . chlordiazePOXIDE (LIBRIUM) capsule 25 mg  25 mg Oral BH-qamhs Saramma Eappen, MD   25 mg at 11/17/13 0819   Followed by  . [START ON 11/18/2013] chlordiazePOXIDE (LIBRIUM) capsule 25 mg  25 mg Oral Daily Saramma Eappen, MD      . feeding supplement (ENSURE COMPLETE) (ENSURE COMPLETE) liquid 237 mL  237 mL Oral BID BM Elyse A Shearer, RD   237 mL at 11/17/13 0939  . gabapentin (NEURONTIN) capsule 200 mg  200 mg Oral BID Ursula Alert, MD   200 mg at 11/17/13 0819  . hydrOXYzine (ATARAX/VISTARIL) tablet 25 mg  25 mg Oral Q6H PRN Ursula Alert, MD   25 mg at 11/16/13 2211  . loperamide (IMODIUM) capsule 2-4 mg  2-4 mg Oral PRN Ursula Alert, MD      . LORazepam (ATIVAN) tablet 1 mg  1 mg Oral Q6H PRN Ursula Alert, MD   1 mg at 11/17/13 1132  . magnesium hydroxide (MILK OF MAGNESIA) suspension 30 mL  30 mL Oral Daily PRN Laverle Hobby, PA-C      . mirtazapine (REMERON) tablet 30 mg  30 mg Oral QHS Ursula Alert, MD   30 mg at 11/16/13 2211  . multivitamin with minerals tablet 1 tablet  1 tablet Oral Daily Ursula Alert, MD   1 tablet at 11/17/13 0819  . nicotine (NICODERM CQ - dosed in mg/24 hours) patch 14 mg  14 mg Transdermal Daily Laverle Hobby, PA-C   14 mg at 11/17/13 0834  . ondansetron (ZOFRAN-ODT) disintegrating tablet 4 mg  4 mg Oral Q6H PRN Ursula Alert, MD      . QUEtiapine (SEROQUEL XR) 24 hr tablet 400 mg  400 mg Oral QHS Ursula Alert, MD   400 mg at 11/16/13 2211  . thiamine (B-1) injection 100 mg  100 mg Intramuscular Once Saramma Eappen, MD      . thiamine (VITAMIN B-1) tablet 100 mg  100 mg Oral Daily Ursula Alert, MD   100 mg at 11/17/13 0819  . traZODone (DESYREL) tablet 50 mg  50 mg Oral QHS Ursula Alert, MD   50 mg at 11/16/13 2211    Lab Results:  No results found for this or any previous visit (from the past 48 hour(s)).  Physical Findings: AIMS: Facial and Oral  Movements Muscles of Facial Expression: None, normal Lips and Perioral Area: None, normal Jaw: None, normal Tongue: None, normal,Extremity Movements Upper (arms, wrists, hands, fingers): None, normal Lower (legs, knees, ankles, toes): None, normal, Trunk Movements Neck, shoulders, hips: None, normal, Overall Severity Severity of abnormal movements (highest score from questions above): None, normal Incapacitation due to abnormal movements: None, normal Patient's awareness of abnormal movements (rate only patient's report): No Awareness, Dental Status Current problems with teeth and/or dentures?: No Does patient usually wear dentures?: No  CIWA:  CIWA-Ar Total: 9 COWS:     Treatment Plan Summary: Daily contact with patient to assess and evaluate symptoms and progress in treatment Medication management  Plan: Will continue  patient on CIWA  as well as Librium protocol for alcohol withdrawal symptoms.Patient has significant substance use problem- Will refer to substance abuse program after stabilization.   Ref. Range  05/07/2013 23:18  05/23/2013 12:24  07/24/2013 01:56  10/12/2013 04:40  11/14/2013 17:26   Alcohol, Ethyl (B)  Latest Range: 0-11 mg/dL  133 (H)  <11  212 (H)  195 (H)  246 (H)   Will restart patient on his medications ,since he reports good efficacy on them ,but was noncompliant secondary to not filling his prescription.  Will continue  Remeron 30 mg po qhs for affective issues.  Will continue Seroquel XR 400 mg po qhs for psychosis,mood lability,sleep.  Will continueTrazodone 50 mg po qhs  for sleep.  Will continue Gabapentin 200 mg po bid for worsening anxiety sx. Will make prn's available for anxiety sx. Patient  placed on 1:1 for safety  FOR HIV -patient used to be a part of START study per ehr.  Will provide nicotine patch for smoking cessation.        Medical Decision Making Problem Points:  Established problem, worsening (2) and New problem, with no additional  work-up planned (3) Data Points:  Order Aims Assessment (2) Review of medication regiment & side effects (2) Review of new medications or change in dosage (2)  I certify that inpatient services furnished can reasonably be expected to improve the patient's condition.   Corena Pilgrim, MD 11/17/2013, 1:21 PM

## 2013-11-17 NOTE — Progress Notes (Signed)
D: Pt is being monitored 1:1 for safety.  Pt is resting in room with eyes closed.  Respirations even, unlabored, WNL.  A:  Staff remains with pt for safety.  R: Will continue to monitor as ordered.  Pt is in no acute distress at this time.

## 2013-11-17 NOTE — Progress Notes (Signed)
Patient ID: John [name], male   DOB: [birth year], 43 y.o.   MRN: [id number]  1:1 Note- D: Patient asleep; no needs at this time.  A: Sitter remains at beside for safety. R: No distress noted.

## 2013-11-17 NOTE — Progress Notes (Signed)
Patient ID: John [name], male   DOB: 05-29-1970, 43 y.o.   MRN: [id number] 1- 1 Monitoring Note. D. Pt 1.1. Monitoring for safety/self injury. A. Staff remain with patient. Pt is in no acute distress at this time. R. Patient is safe. Will continue to monitor as ordered.

## 2013-11-17 NOTE — Progress Notes (Signed)
[place] Group Notes:  (Nursing/MHT/Case Management/Adjunct)  Date:  11/17/2013  Time:  9:41 PM  Type of Therapy:  Group Therapy  Participation Level:  Did Not Attend  Participation Quality:  Did Not Attend  Affect:  Did Not Attend  Cognitive:  Did Not Attend  Insight:  None  Engagement in Group:  Did Not Attend  Modes of Intervention:  Socialization and Support  Summary of Progress/Problems: Pt. Was sleeping in bed.  Lanell Persons 11/17/2013, 9:41 PM

## 2013-11-17 NOTE — Progress Notes (Signed)
1-1 note D. Pt in bed resting comfortably with eyes closed at this time, respirations even and unlabored. Pt does not appear to be in any acute distress. A. 1-1 continued at this time. R. Safety maintained, will continue to monitor.

## 2013-11-17 NOTE — Progress Notes (Signed)
Patient ID: John [name], male   DOB: 1970-06-25, 43 y.o.   MRN: [id number] 1- 1 Monitoring Note. D. Pt 1.1. Monitoring for safety/self injury. A. Staff remain with patient. Pt is in no acute distress at this time. R. Patient is safe. Will continue to monitor as ordered.

## 2013-11-17 NOTE — BHH Group Notes (Signed)
[place] Group Notes:  (Nursing/MHT/Case Management/Adjunct)  Date:  11/17/2013  Time:  10:38 AM  Type of Therapy:  Psychoeducational Skills-self inventory review  Participation Level:  Did Not Attend  Participation Quality:  na  Affect:  na  Cognitive:  na  Insight:  None  Engagement in Group:  na  Modes of Intervention:  na  Summary of Progress/Problems:  Leonia Reader 11/17/2013, 10:38 AM

## 2013-11-17 NOTE — Progress Notes (Signed)
Patient ID: John [name], male   DOB: 08-09-1970, 43 y.o.   MRN: [id number] D. Patient presents with irritable mood, affect labile again today. Briana continues to report auditory hallucinations, '' voices are telling me to kill others and myself, and telling me nasty things. I'm still seeing things too, pink elephants and such . '' Pt irritable, agitated, stating '' I still need someone with me. '' Patient unable to contract for safety again today. .A. Discussed above information with Dr. Darleene Cleaver. Medications given as ordered. Discussed patient progress and orders received. R. Patient is safe. Will continue to monitor as ordered. (1.1. At this time) for safety.

## 2013-11-18 MED ORDER — NAPROXEN 500 MG PO TABS
ORAL_TABLET | ORAL | Status: AC
  Administered 2013-11-18: 500 mg via ORAL
  Filled 2013-11-18: qty 1

## 2013-11-18 MED ORDER — CHLORDIAZEPOXIDE HCL 25 MG PO CAPS
25.0000 mg | ORAL_CAPSULE | Freq: Once | ORAL | Status: AC
  Administered 2013-11-18: 25 mg via ORAL

## 2013-11-18 MED ORDER — NAPROXEN 500 MG PO TABS
500.0000 mg | ORAL_TABLET | Freq: Once | ORAL | Status: AC
  Administered 2013-11-18: 500 mg via ORAL
  Filled 2013-11-18: qty 1

## 2013-11-18 MED ORDER — ONDANSETRON 4 MG PO TBDP: 4.0000 mg | ORAL_TABLET | Freq: Four times a day (QID) | ORAL | Status: AC | PRN

## 2013-11-18 MED ORDER — NAPROXEN 500 MG PO TABS
500.0000 mg | ORAL_TABLET | Freq: Three times a day (TID) | ORAL | Status: DC
  Filled 2013-11-18 (×2): qty 1

## 2013-11-18 MED ORDER — LOPERAMIDE HCL 2 MG PO CAPS: 2.0000 mg | ORAL_CAPSULE | ORAL | Status: AC | PRN

## 2013-11-18 MED ORDER — NAPROXEN 500 MG PO TABS
500.0000 mg | ORAL_TABLET | Freq: Two times a day (BID) | ORAL | Status: DC
  Filled 2013-11-18 (×5): qty 1

## 2013-11-18 MED ORDER — MIRTAZAPINE 15 MG PO TABS
45.0000 mg | ORAL_TABLET | Freq: Every day | ORAL | Status: DC
  Administered 2013-11-18 – 2013-11-19 (×2): 45 mg via ORAL
  Filled 2013-11-18 (×3): qty 3

## 2013-11-18 MED ORDER — NAPROXEN 500 MG PO TABS
500.0000 mg | ORAL_TABLET | Freq: Three times a day (TID) | ORAL | Status: DC
  Administered 2013-11-18 – 2013-11-20 (×6): 500 mg via ORAL
  Filled 2013-11-18 (×12): qty 1

## 2013-11-18 MED ORDER — CHLORDIAZEPOXIDE HCL 25 MG PO CAPS: 25.0000 mg | ORAL_CAPSULE | Freq: Four times a day (QID) | ORAL | Status: AC | PRN

## 2013-11-18 MED ORDER — HYDROXYZINE HCL 25 MG PO TABS: 25.0000 mg | ORAL_TABLET | Freq: Four times a day (QID) | ORAL | Status: AC | PRN

## 2013-11-18 NOTE — Progress Notes (Signed)
D: Pt is resting in room.  Respirations even, unlabored, WNL. A: 1:1 sitter at bedside for safety. R: Pt is in no distress at this time.  Will continue to monitor as ordered for safety.

## 2013-11-18 NOTE — Progress Notes (Signed)
D: Pt is being monitored 1:1 for safety.  Pt is resting in room.   A: Sitter is at bedside with pt.   R: Pt is in no acute distress at this time.  Will continue to monitor as ordered.

## 2013-11-18 NOTE — Plan of Care (Signed)
Problem: Ineffective individual coping Goal: STG: Patient will remain free from self harm Outcome: Progressing Pt has not harmed self this shift.  Remains on 1:1 for safety.    Problem: Alteration in mood Goal: LTG-Patient reports reduction in suicidal thoughts (Patient reports reduction in suicidal thoughts and is able to verbalize a safety plan for whenever patient is feeling suicidal)  Outcome: Not Progressing Pt continues to report SI and does not contract for safety.    Problem: Diagnosis: Increased Risk For Suicide Attempt Goal: STG-Patient Will Attend All Groups On The Unit Outcome: Not Progressing Pt did not attend evening group on 11/17/13.  Problem: Alteration in mood & ability to function due to Goal: STG-Patient will comply with prescribed medication regimen (Patient will comply with prescribed medication regimen)  Outcome: Progressing Pt was compliant with HS medications on 11/17/13.

## 2013-11-18 NOTE — BHH Group Notes (Signed)
[place] Group Notes: (Clinical Social Work)   11/18/2013      Type of Therapy:  Group Therapy   Participation Level:  Did Not Attend    Selmer Dominion, LCSW 11/18/2013, 12:35 PM

## 2013-11-18 NOTE — Progress Notes (Signed)
Pt did not attend group this evening.  

## 2013-11-18 NOTE — Progress Notes (Signed)
D: Pt presents with flat, depressed affect and depressed mood.  Pt reports SI and refuses to contract for safety.  Pt denies HI and auditory hallucinations.  Pt reports visual hallucinations of "people walking on the walls."  Pt has been in his room since the beginning of the shift and refused to go to evening group.    A: Encouraged and supported pt.  Pt remains on 1:1 for safety.  Medications administered per MD order.    R: Pt is in no acute distress at this time.  Pt interacts with staff appropriately and is compliant with scheduled medications.  Will continue to monitor as ordered.

## 2013-11-18 NOTE — Progress Notes (Signed)
Patient ID: John [name], male   DOB: 1971-01-24, 43 y.o.   MRN: [id number] [facility] MD Progress Note  11/18/2013 11:33 AM John [name]  MRN:  [id number] Subjective: " my gouty arthritis is acting up, I am in a lot of pain today. My depression is worse and I am still feeling  suicidal.''  Objective: Patient was seen and his chart reviewed. Patient reports worsening depressive symptoms, but decreased auditory hallucinations, still hearing voices  telling him to hurt himself and seeing people "walking on the walls in his room.'' Patient reports ongoing suicidal thoughts with no specific plan. He is self isolated, withdrawn, evasive with minimal interaction with peers. However, he is compliant with his medications and has not verbalized any adverse reactions.  Patient to be placed on 1:1 for safety   Diagnosis:   DSM5: DSM5:  Primary psychiatric diagnosis:  Major depressive disorder,recurrent severe with psychosis   Secondary psychiatric diagnosis:  Alcohol use disorder  Cannabis use disorder  Stimulant (cocaine) use disoder  Tobacco use disorder   Non psychiatric diagnosis:  Asthma  HIV (Dxed in 2014)  Gout  Chronic low back pain     ADL's:  Impaired  Sleep: Poor  Appetite:  Fair  Suicidal Ideation:  Plan:  wants to bang his head to death Homicidal Ideation:  Denies   AEB (as evidenced by):  Psychiatric Specialty Exam: Physical Exam  Constitutional: He is oriented to person, place, and time. He appears well-developed and well-nourished.  HENT:  Head: Normocephalic and atraumatic.  Neurological: He is alert and oriented to person, place, and time.  Psychiatric: His speech is normal. His mood appears anxious. His affect is angry and labile. He is agitated, aggressive, actively hallucinating and combative. Thought content is paranoid. Cognition and memory are normal. He expresses impulsivity and inappropriate judgment. He exhibits a depressed mood. He expresses suicidal  ideation. He expresses suicidal plans.    Review of Systems  Constitutional: Negative.   HENT: Negative.   Eyes: Negative.   Cardiovascular: Negative.   Gastrointestinal: Negative.   Musculoskeletal: Positive for myalgias.  Skin: Negative.   Psychiatric/Behavioral: Positive for depression, suicidal ideas, hallucinations and substance abuse. The patient is nervous/anxious and has insomnia.     Blood pressure 129/105, pulse 121, temperature 98.5 F (36.9 C), temperature source Oral, resp. rate 20, height 5\' 8"  (1.727 m), weight 87.544 kg (193 lb).Body mass index is 29.35 kg/(m^2).  General Appearance: Disheveled  Eye Contact::  Minimal  Speech:  Normal Rate  Volume:  Increased  Mood:  Angry, Anxious, Depressed, Hopeless and Irritable  Affect:  Labile  Thought Process:  Linear  Orientation:  Full (Time, Place, and Person)  Thought Content:  Hallucinations: Auditory  Suicidal Thoughts:  Yes.  with intent/plan with thoughts to bang his head to death  Homicidal Thoughts:  No  Memory:  Immediate;   Fair Recent;   Fair Remote;   Fair  Judgement:  Impaired  Insight:  Lacking  Psychomotor Activity:  Increased  Concentration:  Fair  Recall:  [employer] of Knowledge:Fair  Language: Fair  Akathisia:  No    AIMS (if indicated):   0  Assets:  Others:  access to health care  Sleep:  Number of Hours: 6.75   Musculoskeletal: Strength & Muscle Tone: within normal limits Gait & Station: normal Patient leans: N/A  Current Medications: Current Facility-Administered Medications  Medication Dose Route Frequency Provider Last Rate Last Dose  . acetaminophen (TYLENOL) tablet 650 mg  650 mg Oral  Q6H PRN Laverle Hobby, PA-C      . albuterol (PROVENTIL HFA;VENTOLIN HFA) 108 (90 BASE) MCG/ACT inhaler 2 puff  2 puff Inhalation Q4H PRN Ursula Alert, MD   2 puff at 11/15/13 2227  . alum & mag hydroxide-simeth (MAALOX/MYLANTA) 200-200-20 MG/5ML suspension 30 mL  30 mL Oral Q4H PRN Laverle Hobby, PA-C      . chlordiazePOXIDE (LIBRIUM) capsule 25 mg  25 mg Oral Q6H PRN Leisha Trinkle      . feeding supplement (ENSURE COMPLETE) (ENSURE COMPLETE) liquid 237 mL  237 mL Oral BID BM Elyse A Shearer, RD   237 mL at 11/18/13 0909  . gabapentin (NEURONTIN) capsule 200 mg  200 mg Oral BID Ursula Alert, MD   200 mg at 11/18/13 0743  . hydrOXYzine (ATARAX/VISTARIL) tablet 25 mg  25 mg Oral Q6H PRN Beckem Tomberlin      . loperamide (IMODIUM) capsule 2-4 mg  2-4 mg Oral PRN Deandria Klute      . LORazepam (ATIVAN) tablet 1 mg  1 mg Oral Q6H PRN Ursula Alert, MD   1 mg at 11/18/13 0743  . magnesium hydroxide (MILK OF MAGNESIA) suspension 30 mL  30 mL Oral Daily PRN Laverle Hobby, PA-C      . mirtazapine (REMERON) tablet 30 mg  30 mg Oral QHS Ursula Alert, MD   30 mg at 11/17/13 2219  . multivitamin with minerals tablet 1 tablet  1 tablet Oral Daily Ursula Alert, MD   1 tablet at 11/18/13 0743  . naproxen (NAPROSYN) tablet 500 mg  500 mg Oral BID WC Saleema Weppler      . nicotine (NICODERM CQ - dosed in mg/24 hours) patch 14 mg  14 mg Transdermal Daily Laverle Hobby, PA-C   14 mg at 11/18/13 0830  . ondansetron (ZOFRAN-ODT) disintegrating tablet 4 mg  4 mg Oral Q6H PRN Gurnoor Ursua      . QUEtiapine (SEROQUEL XR) 24 hr tablet 400 mg  400 mg Oral QHS Ursula Alert, MD   400 mg at 11/17/13 2219  . thiamine (VITAMIN B-1) tablet 100 mg  100 mg Oral Daily Ursula Alert, MD   100 mg at 11/18/13 0743  . traZODone (DESYREL) tablet 50 mg  50 mg Oral QHS Ursula Alert, MD   50 mg at 11/17/13 2219    Lab Results:  No results found for this or any previous visit (from the past 48 hour(s)).  Physical Findings: AIMS: Facial and Oral Movements Muscles of Facial Expression: None, normal Lips and Perioral Area: None, normal Jaw: None, normal Tongue: None, normal,Extremity Movements Upper (arms, wrists, hands, fingers): None, normal Lower (legs, knees, ankles, toes): None, normal, Trunk  Movements Neck, shoulders, hips: None, normal, Overall Severity Severity of abnormal movements (highest score from questions above): None, normal Incapacitation due to abnormal movements: None, normal Patient's awareness of abnormal movements (rate only patient's report): No Awareness, Dental Status Current problems with teeth and/or dentures?: No Does patient usually wear dentures?: No  CIWA:  CIWA-Ar Total: 0 COWS:     Treatment Plan Summary: Daily contact with patient to assess and evaluate symptoms and progress in treatment Medication management  Plan: Will continue  patient on CIWA as well as Librium protocol for alcohol withdrawal symptoms.Patient has significant substance use problem- Will refer to substance abuse program after stabilization.   Ref. Range  05/07/2013 23:18  05/23/2013 12:24  07/24/2013 01:56  10/12/2013 04:40  11/14/2013 17:26   Alcohol, Ethyl (B)  Latest Range: 0-11 mg/dL  133 (H)  <11  212 (H)  195 (H)  246 (H)   Will restart patient on his medications ,since he reports good efficacy on them ,but was noncompliant secondary to not filling his prescription.  Will increase  Remeron to 45mg  po qhs for affective issues.  Will continue Seroquel XR 400 mg po qhs for psychosis,mood lability,sleep.  Will continueTrazodone 50 mg po qhs  for sleep.  Will continue Gabapentin 200 mg po bid for worsening anxiety sx. Will make prn's available for anxiety sx. Patient  placed on 1:1 for safety  FOR HIV -patient used to be a part of START study per ehr.  Will provide nicotine patch for smoking cessation.   Medical Decision Making Problem Points:  Established problem, worsening (2) and New problem, with no additional work-up planned (3) Data Points:  Order Aims Assessment (2) Review of medication regiment & side effects (2) Review of new medications or change in dosage (2)  I certify that inpatient services furnished can reasonably be expected to improve the patient's condition.    Corena Pilgrim, MD 11/18/2013, 11:33 AM

## 2013-11-18 NOTE — Progress Notes (Signed)
Patient ID: John [name], male   DOB: 06/27/1970, 43 y.o.   MRN: [id number] 1- 1 Monitoring Note. D. Pt 1.1. Monitoring for safety/self injury. A. Staff remain with patient. Pt is in no acute distress at this time, currently eating dinner.. R. Patient is safe. Will continue to monitor as ordered.

## 2013-11-18 NOTE — Progress Notes (Signed)
Patient ID: John [name], male   DOB: Sep 26, 1970, 43 y.o.   MRN: [id number] John [name] presents with irritable mood, affect labile again today.Pt continues to report persistent auditory hallucinations, and suicidal ideation. He states '' I'm still hearing voices that are telling me terrible things to hurt myself and all. I still feel very agitated, and I'm tired. '' Patient continues to be unable to contract for safety, requesting 1.1. Staff to stay at bedside. He also complains of gout pain this morning to great toe. He states '' I've been eating a lot of red meat here and that is what has messed this gout up for me. '' A.Medications given as ordered. Support and encouragement provided. John [name] continues to be isolative to his room throughout shift thus far.  Discussed above information with Dr. Darleene Cleaver. R. Patient is safe. Will continue to monitor as ordered. (1.1. At this time) for safety.

## 2013-11-18 NOTE — Progress Notes (Signed)
Patient ID: John [name], male   DOB: 1971-01-20, 43 y.o.   MRN: [id number] 1- 1 Monitoring Note. D. Pt 1.1. Monitoring for safety/self injury. A. Staff remain with patient. Pt is in no acute distress at this time, currently resting quietly in bed.. R. Patient is safe. Will continue to monitor as ordered.

## 2013-11-18 NOTE — [facility] Group Notes (Signed)
[facility] Group Notes:  (Nursing/MHT/Case Management/Adjunct)  Date:  11/18/2013  Time: 0920 and 0940 am  Type of Therapy:  Psychoeducational Skills  Participation Level:  Did Not Attend   John [name] 11/18/2013, 1:54 PM

## 2013-11-18 NOTE — Progress Notes (Signed)
D: Pt is being monitored 1:1 for safety. Pt is resting in room with eyes closed. Respirations even, unlabored, WNL. A: Staff remains with pt for safety. R: Will continue to monitor as ordered. Pt is in no acute distress at this time.

## 2013-11-19 MED ORDER — QUETIAPINE FUMARATE ER 300 MG PO TB24
500.0000 mg | ORAL_TABLET | Freq: Every day | ORAL | Status: DC
  Administered 2013-11-19: 500 mg via ORAL
  Filled 2013-11-19 (×2): qty 1

## 2013-11-19 MED ORDER — GABAPENTIN 300 MG PO CAPS
300.0000 mg | ORAL_CAPSULE | Freq: Two times a day (BID) | ORAL | Status: DC
  Administered 2013-11-19 – 2013-11-21 (×4): 300 mg via ORAL
  Filled 2013-11-19 (×6): qty 1

## 2013-11-19 MED ORDER — ONDANSETRON 4 MG PO TBDP
ORAL_TABLET | ORAL | Status: AC
  Administered 2013-11-19: 4 mg via ORAL
  Filled 2013-11-19: qty 2

## 2013-11-19 MED ORDER — TRAZODONE HCL 50 MG PO TABS
50.0000 mg | ORAL_TABLET | Freq: Every day | ORAL | Status: DC
  Administered 2013-11-19: 50 mg via ORAL
  Filled 2013-11-19 (×2): qty 1

## 2013-11-19 MED ORDER — TRAZODONE HCL 100 MG PO TABS: 100.0000 mg | ORAL_TABLET | Freq: Every day | ORAL | Status: DC

## 2013-11-19 MED ORDER — ONDANSETRON 4 MG PO TBDP
4.0000 mg | ORAL_TABLET | Freq: Three times a day (TID) | ORAL | Status: DC | PRN
  Administered 2013-11-19: 4 mg via ORAL

## 2013-11-19 MED ORDER — ONDANSETRON HCL 4 MG/2ML IJ SOLN
8.0000 mg | Freq: Once | INTRAMUSCULAR | Status: DC
  Filled 2013-11-19: qty 4

## 2013-11-19 NOTE — Progress Notes (Addendum)
Pt states he has not been feeling good and vomitted after drinking ensure while lying down. Pt was instructed to sit up while drinking. He was given zofran under the tongues and given gatorade to drink . He is cooperative. Pt remains a 1:1 and contracts for safety. He is pleasant.Pt also states his gout is acting up and will be given naprosyn po for the pain. Pt has periods where he abruptly becomes short of breath which is relieved by 2 puffs of his inhaler.

## 2013-11-19 NOTE — Progress Notes (Signed)
D: Pt is resting in room. Respirations even, unlabored, WNL. A: 1:1 sitter at bedside for safety. R: Pt is in no distress at this time. Will continue to monitor as ordered for safety.

## 2013-11-19 NOTE — Plan of Care (Signed)
Problem: Alteration in mood Goal: STG-Patient reports thoughts of self-harm to staff Outcome: Progressing Pt reported SI and verbally contracted for safety this shift.    Problem: Diagnosis: Increased Risk For Suicide Attempt Goal: LTG-Patient Will Report Improvement in Psychotic Symptoms LTG (by discharge) : Patient will report improvement in psychotic symptoms.  Outcome: Progressing Pt denied having hallucinations this shift.   Goal: STG-Patient Will Attend All Groups On The Unit Outcome: Not Progressing Pt did not attend evening group on 11/18/13. Goal: STG-Patient Will Report Suicidal Feelings to Staff Outcome: Progressing Pt reported SI to staff and verbally contracted for safety.   Goal: STG-Patient Will Comply With Medication Regime Outcome: Progressing Pt was compliant with all scheduled medications this shift.

## 2013-11-19 NOTE — Progress Notes (Signed)
Patient ID: John [name], male   DOB: 1970/12/28, 43 y.o.   MRN: [id number] Patient reports  depressive symptoms, positive for AH.  Reports SI with plan to kill self by banging his head. Extremely irritable. 1;1 continues. Patient safe.

## 2013-11-19 NOTE — Progress Notes (Addendum)
D: Pt has been isolative and has remained in his room this shift.  Pt reports SI.  Pt denies HI and hallucinations.  Pt denies pain, reports withdrawal symptom of sweating.    A: Medications administered per order.  Encouraged and supported pt.  Safety maintained with 1:1 sitter per order.  R: Pt is compliant with medications.  Pt verbally contracts for safety.  Will continue to monitor per order.

## 2013-11-19 NOTE — Progress Notes (Signed)
Patient ID: John [name], male   DOB: 02/24/1971, 43 y.o.   MRN: [id number] D: Pt is being monitored 1:1 for safety. Pt is eating in dayroom.  A: Sitter is at bedside with pt.  R: Pt is in no acute distress at this time. Will continue to monitor as ordered.

## 2013-11-19 NOTE — Progress Notes (Signed)
Patient ID: Charleston Hankin, male   DOB: [birth year], 43 y.o.   MRN: [id number] [facility] MD Progress Note  11/19/2013 12:53 PM Donney Caraveo  MRN:  [id number] Subjective: " I hear voices and I am suicidal -would like to bang my head to death".  Objective: Patient was seen and his chart reviewed. Patient reports worsening depressive symptoms, with AH which are telling him several things. He also reports SI with plan to kill self by banging his head. Patient appears to be very irritable as well as labile.He continues to be  with minimal interaction with peers. However, he is compliant with his medications and has not verbalized any adverse reactions.  Patient to be placed on 1:1 for safety   Diagnosis:    DSM5:  Primary psychiatric diagnosis:  Major depressive disorder,recurrent severe with psychosis   Secondary psychiatric diagnosis:  Alcohol use disorder  Alcohol withdrawal on (Librium protocol) Cannabis use disorder  Stimulant (cocaine) use disoder  Tobacco use disorder   Non psychiatric diagnosis:  Asthma  HIV (Dxed in 2014)  Gout  Chronic low back pain     ADL's:  Impaired  Sleep: Poor  Appetite:  Fair  Suicidal Ideation:  Plan:  wants to bang his head to death Homicidal Ideation:  Denies   AEB (as evidenced by):  Psychiatric Specialty Exam: Physical Exam  Constitutional: He is oriented to person, place, and time. He appears well-developed and well-nourished.  HENT:  Head: Normocephalic and atraumatic.  Neurological: He is alert and oriented to person, place, and time.  Psychiatric: His speech is normal. His mood appears anxious. His affect is angry and labile. He is agitated, aggressive, actively hallucinating and combative. Thought content is paranoid. Cognition and memory are normal. He expresses impulsivity and inappropriate judgment. He exhibits a depressed mood. He expresses suicidal ideation. He expresses suicidal plans.    Review of Systems  Constitutional:  Negative.   HENT: Negative.   Eyes: Negative.   Cardiovascular: Negative.   Gastrointestinal: Negative.   Musculoskeletal: Positive for myalgias.  Skin: Negative.   Psychiatric/Behavioral: Positive for depression, suicidal ideas, hallucinations and substance abuse. The patient is nervous/anxious and has insomnia.     Blood pressure 131/86, pulse 115, temperature 97.6 F (36.4 C), temperature source Oral, resp. rate 18, height 5\' 8"  (1.727 m), weight 87.544 kg (193 lb).Body mass index is 29.35 kg/(m^2).  General Appearance: Disheveled  Eye Contact::  Minimal  Speech:  Normal Rate  Volume:  Increased  Mood:  Angry, Anxious, Depressed, Hopeless and Irritable  Affect:  Labile  Thought Process:  Linear  Orientation:  Full (Time, Place, and Person)  Thought Content:  Hallucinations: Auditory  Suicidal Thoughts:  Yes.  with intent/plan with thoughts to bang his head to death  Homicidal Thoughts:  No  Memory:  Immediate;   Fair Recent;   Fair Remote;   Fair  Judgement:  Impaired  Insight:  Lacking  Psychomotor Activity:  Increased  Concentration:  Fair  Recall:  [employer] of Knowledge:Fair  Language: Fair  Akathisia:  No    AIMS (if indicated):   0  Assets:  Others:  access to health care  Sleep:  Number of Hours: 6.75   Musculoskeletal: Strength & Muscle Tone: within normal limits Gait & Station: normal Patient leans: N/A  Current Medications: Current Facility-Administered Medications  Medication Dose Route Frequency Provider Last Rate Last Dose  . acetaminophen (TYLENOL) tablet 650 mg  650 mg Oral Q6H PRN Laverle Hobby, PA-C      .  albuterol (PROVENTIL HFA;VENTOLIN HFA) 108 (90 BASE) MCG/ACT inhaler 2 puff  2 puff Inhalation Q4H PRN Ursula Alert, MD   2 puff at 11/19/13 0827  . alum & mag hydroxide-simeth (MAALOX/MYLANTA) 200-200-20 MG/5ML suspension 30 mL  30 mL Oral Q4H PRN Laverle Hobby, PA-C      . chlordiazePOXIDE (LIBRIUM) capsule 25 mg  25 mg Oral Q6H PRN  Mojeed Akintayo      . feeding supplement (ENSURE COMPLETE) (ENSURE COMPLETE) liquid 237 mL  237 mL Oral BID BM Elyse A Shearer, RD   237 mL at 11/19/13 1000  . gabapentin (NEURONTIN) capsule 300 mg  300 mg Oral BID Ursula Alert, MD      . hydrOXYzine (ATARAX/VISTARIL) tablet 25 mg  25 mg Oral Q6H PRN Mojeed Akintayo      . loperamide (IMODIUM) capsule 2-4 mg  2-4 mg Oral PRN Mojeed Akintayo      . LORazepam (ATIVAN) tablet 1 mg  1 mg Oral Q6H PRN Ursula Alert, MD   1 mg at 11/18/13 0743  . magnesium hydroxide (MILK OF MAGNESIA) suspension 30 mL  30 mL Oral Daily PRN Laverle Hobby, PA-C      . mirtazapine (REMERON) tablet 45 mg  45 mg Oral QHS Mojeed Akintayo   45 mg at 11/18/13 2206  . multivitamin with minerals tablet 1 tablet  1 tablet Oral Daily Shawnese Magner, MD   1 tablet at 11/19/13 0800  . naproxen (NAPROSYN) tablet 500 mg  500 mg Oral TID WC Ursula Alert, MD   500 mg at 11/19/13 1251  . nicotine (NICODERM CQ - dosed in mg/24 hours) patch 14 mg  14 mg Transdermal Daily Laverle Hobby, PA-C   14 mg at 11/19/13 8841  . ondansetron (ZOFRAN-ODT) disintegrating tablet 4 mg  4 mg Oral Q6H PRN Mojeed Akintayo      . QUEtiapine (SEROQUEL XR) 24 hr tablet 500 mg  500 mg Oral QHS Glenys Snader, MD      . thiamine (VITAMIN B-1) tablet 100 mg  100 mg Oral Daily Demorris Choyce, MD   100 mg at 11/19/13 0800  . traZODone (DESYREL) tablet 50 mg  50 mg Oral QHS Ursula Alert, MD        Lab Results:  No results found for this or any previous visit (from the past 48 hour(s)).  Physical Findings: AIMS: Facial and Oral Movements Muscles of Facial Expression: None, normal Lips and Perioral Area: None, normal Jaw: None, normal Tongue: None, normal,Extremity Movements Upper (arms, wrists, hands, fingers): None, normal Lower (legs, knees, ankles, toes): None, normal, Trunk Movements Neck, shoulders, hips: None, normal, Overall Severity Severity of abnormal movements (highest score from  questions above): None, normal Incapacitation due to abnormal movements: None, normal Patient's awareness of abnormal movements (rate only patient's report): No Awareness, Dental Status Current problems with teeth and/or dentures?: No Does patient usually wear dentures?: No  CIWA:  CIWA-Ar Total: 2 COWS:     Treatment Plan Summary: Daily contact with patient to assess and evaluate symptoms and progress in treatment Medication management  Plan: Will continue  patient on CIWA as well as Librium protocol for alcohol withdrawal symptoms.Patient has significant substance use problem- Will refer to substance abuse program after stabilization.   Ref. Range  05/07/2013 23:18  05/23/2013 12:24  07/24/2013 01:56  10/12/2013 04:40  11/14/2013 17:26   Alcohol, Ethyl (B)  Latest Range: 0-11 mg/dL  133 (H)  <11  212 (H)  195 (H)  246 (  H)    Will continue  Remeron  45mg  po qhs for affective issues.  Will increase Seroquel XR 500mg  po qhs for psychosis,mood lability,sleep.  Will continue trazodone 50 mg po qhs. Will increase gabapentin to 300 mg po bid. Will make prn's available for anxiety sx. Patient  placed on 1:1 for safety  FOR HIV -patient used to be a part of START study per ehr.  Will provide nicotine patch for smoking cessation.   Medical Decision Making Problem Points:  Established problem, worsening (2) and New problem, with no additional work-up planned (3) Data Points:  Order Aims Assessment (2) Review of medication regiment & side effects (2) Review of new medications or change in dosage (2)  I certify that inpatient services furnished can reasonably be expected to improve the patient's condition.   Mirenda Baltazar, MD 11/19/2013, 12:53 PM

## 2013-11-19 NOTE — B[facility] Group Notes (Signed)
[facility] LCSW Aftercare Discharge Planning Group Note   11/19/2013 2:04 PM  Participation Quality:  Minimal  Mood/Affect:  Depressed  Depression Rating:  10  Anxiety Rating:  10  Thoughts of Suicide:  Yes Will you contract for safety?   Yes  Current AVH:  Yes  Plan for Discharge/Comments:  When asked about how he gets peace of mind, John [name] stated "I get it when I am alone.  Not around others.  That is why I stay in my room all the time.  I feel better there."  When asked if he feels like he gets to spend enough time alone in his room, he responded in the affirmative.  I then pointed out that he remains on 1:1 staffing, which indicates it is not as therapetuic as he states it is.  He took exception to this observation, and refused to answer any more questions.  However, he did remain in group.  Transportation Means: bus  Supports: unk   [employer], [place]

## 2013-11-20 DIAGNOSIS — Z21 Asymptomatic human immunodeficiency virus [HIV] infection status: Secondary | ICD-10-CM

## 2013-11-20 DIAGNOSIS — M109 Gout, unspecified: Secondary | ICD-10-CM

## 2013-11-20 DIAGNOSIS — F10229 Alcohol dependence with intoxication, unspecified: Secondary | ICD-10-CM

## 2013-11-20 DIAGNOSIS — IMO0001 Reserved for inherently not codable concepts without codable children: Secondary | ICD-10-CM

## 2013-11-20 DIAGNOSIS — R030 Elevated blood-pressure reading, without diagnosis of hypertension: Secondary | ICD-10-CM

## 2013-11-20 HISTORY — DX: Reserved for inherently not codable concepts without codable children: IMO0001

## 2013-11-20 MED ORDER — LORAZEPAM 1 MG PO TABS: 1.0000 mg | ORAL_TABLET | Freq: Four times a day (QID) | ORAL | Status: AC | PRN

## 2013-11-20 MED ORDER — TRAZODONE HCL 100 MG PO TABS: 100.0000 mg | ORAL_TABLET | Freq: Every day | ORAL | Status: DC

## 2013-11-20 MED ORDER — QUETIAPINE FUMARATE ER 300 MG PO TB24
600.0000 mg | ORAL_TABLET | Freq: Every day | ORAL | Status: DC
  Administered 2013-11-20: 600 mg via ORAL
  Filled 2013-11-20 (×2): qty 2

## 2013-11-20 MED ORDER — DIVALPROEX SODIUM 500 MG PO DR TAB
500.0000 mg | DELAYED_RELEASE_TABLET | Freq: Two times a day (BID) | ORAL | Status: DC
  Administered 2013-11-20 – 2013-11-26 (×13): 500 mg via ORAL
  Filled 2013-11-20: qty 1
  Filled 2013-11-20: qty 28
  Filled 2013-11-20 (×14): qty 1
  Filled 2013-11-20: qty 28

## 2013-11-20 MED ORDER — TRAZODONE HCL 50 MG PO TABS
50.0000 mg | ORAL_TABLET | Freq: Every day | ORAL | Status: DC
  Administered 2013-11-21 – 2013-11-25 (×5): 50 mg via ORAL
  Filled 2013-11-20 (×8): qty 1

## 2013-11-20 MED ORDER — NAPROXEN 500 MG PO TABS: 500.0000 mg | ORAL_TABLET | Freq: Two times a day (BID) | ORAL | Status: DC

## 2013-11-20 MED ORDER — INDOMETHACIN 50 MG PO CAPS
50.0000 mg | ORAL_CAPSULE | Freq: Two times a day (BID) | ORAL | Status: DC
  Administered 2013-11-20 – 2013-11-21 (×2): 50 mg via ORAL
  Filled 2013-11-20 (×6): qty 1

## 2013-11-20 MED ORDER — NAPROXEN 500 MG PO TABS
500.0000 mg | ORAL_TABLET | Freq: Two times a day (BID) | ORAL | Status: DC | PRN
  Administered 2013-11-20: 500 mg via ORAL
  Filled 2013-11-20: qty 1

## 2013-11-20 MED ORDER — MIRTAZAPINE 15 MG PO TABS
15.0000 mg | ORAL_TABLET | Freq: Every day | ORAL | Status: DC
  Administered 2013-11-20 – 2013-11-22 (×3): 15 mg via ORAL
  Filled 2013-11-20 (×4): qty 1

## 2013-11-20 MED ORDER — BENZOCAINE 10 % MT GEL
Freq: Four times a day (QID) | OROMUCOSAL | Status: DC | PRN
  Administered 2013-11-20: 18:00:00 via OROMUCOSAL
  Filled 2013-11-20: qty 9.4

## 2013-11-20 NOTE — Tx Team (Signed)
Interdisciplinary Treatment Plan Update (Adult)   Date: 11/20/2013   Time Reviewed: 9:00 AM Progress in Treatment:  Attending groups: Yes  Participating in groups:  Minimally Taking medication as prescribed: Yes  Tolerating medication: Yes  Family/Significant othe contact made: SPE completed with pt's aunt. SPI pamphlet provided to pt as well.  Patient understands diagnosis: Yes, AEB seeking treatment for mood stabilization/depression, AVH, SI with plan, and HI with no plan/no specific target, and medication stabilization.  Discussing patient identified problems/goals with staff: Yes  Medical problems stabilized or resolved: Yes   Denies suicidal/homicidal ideation: Pt continues to present with depressed mood and agitated affect. He endorses passive SI/HI and AVH currently. Pt on 1:1 due to risk of harm to self. No medication improvements noted by pt.  Patient has not harmed self or Others: Yes  New problem(s) identified:  Discharge Plan or Barriers: Pt plans to return to his aunt's home at d/c and follow up at [facility] for mental health services. Pt reports minimal alcohol consumption and daily marijuana use. He reports no desire to stop drinking/smoking marijuana and is not open to further referrals. CSW provided pt with [employer] packet.  Additional comments: n/a Reason for Continuation of Hospitalization: SI with plan Hi without plan or identified victim Mood stabilization Med management AVH Estimated length of stay:3-5 days  For review of initial/current patient goals, please see plan of care.  Attendees:  Patient:    Family:    Physician: Dr. Shea Evans MD 11/20/2013 9:00AM  Nursing: Jan RN 11/20/2013 9:00AM  Clinical Social Worker Press photographer, [place]  11/20/2013 9:00AM  Other: Roque Lias LCSW 11/20/2013 9:00AM  Other:  11/20/2013 9:00AM  Other:  11/20/2013 9:00AM  Other:    Scribe for Treatment Team:  Nira Conn Smart LCSWA 11/20/2013 9:00AM

## 2013-11-20 NOTE — Progress Notes (Signed)
D:Pt remains irritable and is rating his depression as a 10 on 1-10 scale with 10 being the most. Pt has been asleep much of the morning. A:Offered support and 1:1 observation. Gave medication as ordered.  R:Safety maintained on the unit.

## 2013-11-20 NOTE — [facility] Group Notes (Signed)
[facility] LCSW Group Therapy  11/20/2013 1:15 pm  Type of Therapy: Process Group Therapy  Participation Level:  Active  Participation Quality:  Appropriate  Affect:  Flat  Cognitive:  Oriented  Insight:  Improving  Engagement in Group:  Limited  Engagement in Therapy:  Limited  Modes of Intervention:  Activity, Clarification, Education, Problem-solving and Support  Summary of Progress/Problems: Today's group addressed the issue of overcoming obstacles.  Patients were asked to identify their biggest obstacle post d/c that stands in the way of their on-going success, and then problem solve as to how to manage this.  John [name] stated that his biggest obstacle is getting a job.  He described feelings of frustration, anger and hopelessness about his lack of success.  Declined to share any more.  Sat with his head down and eyes closed for the rest of group, but stayed for the entirety.   John [name] 11/20/2013   12:44 PM

## 2013-11-20 NOTE — Progress Notes (Addendum)
Pt stated he is so depressed today thinking about his mom who died four months ago. He stated,'My uncle has her ashes and he hated her,." Pt thought about getting her ashes and having a service. " He denies SI and HI and contracts for safety.Pt is concerned about his gout and is unsure why he has to go off his indocin. He stated,"I don't think my blood pressure is that bad." He also c/o tooth pain and oragel was given. 5pm -1:1 nursing note: Pt remains safe on a 1:1 and contracts for safety. He does state he has been really depressed today and can not get his mother out of his mind. Pt has been in the dayroom and contracts for safety. He does appear calm and denies any nausea. He is very concerned about when his feet will feel better from the gout.

## 2013-11-20 NOTE — Progress Notes (Signed)
Patient ID: John [name], male   DOB: February 17, 1971, 43 y.o.   MRN: [id number] [facility] MD Progress Note  11/20/2013 12:16 PM Yovani Cogburn  MRN:  [id number] Subjective: " I am suicidal"  Objective: Patient was seen and his chart reviewed. Patient continues to be irritable with labile mood . He reports, with AH which are telling him several things, such as kill himself . He also reports SI with plan to kill self by banging his head. Patient has very poor eye contact ,restricted affect , is not cooperative with evaluation ,does not want to communicate his problems. He continues to be  with minimal interaction with peers. However, he is compliant with his medications and has not verbalized any adverse reactions.  Patient to be placed on 1:1 for safety   Diagnosis:    DSM5:  Primary psychiatric diagnosis:  Unspecified Bipolar and other related disorder  Secondary psychiatric diagnosis:  Alcohol use disorder  Alcohol withdrawal on (Librium protocol) Cannabis use disorder  Stimulant (cocaine) use disoder  Tobacco use disorder   Non psychiatric diagnosis:  Asthma  HIV (Dxed in 2014)  Gout  Chronic low back pain     ADL's:  Impaired  Sleep: Poor  Appetite:  Fair  Suicidal Ideation:  Plan:  wants to bang his head to death Homicidal Ideation:  Denies   AEB (as evidenced by):  Psychiatric Specialty Exam: Physical Exam  Constitutional: He is oriented to person, place, and time. He appears well-developed and well-nourished.  HENT:  Head: Normocephalic and atraumatic.  Neurological: He is alert and oriented to person, place, and time.  Psychiatric: His speech is normal. His mood appears anxious. His affect is angry and labile. He is agitated, aggressive, actively hallucinating and combative. Thought content is paranoid. Cognition and memory are normal. He expresses impulsivity and inappropriate judgment. He exhibits a depressed mood. He expresses suicidal ideation. He expresses suicidal  plans.    Review of Systems  Constitutional: Negative.   HENT: Negative.   Eyes: Negative.   Cardiovascular: Negative.   Gastrointestinal: Negative.   Musculoskeletal: Positive for myalgias.  Skin: Negative.   Psychiatric/Behavioral: Positive for depression, suicidal ideas, hallucinations and substance abuse. The patient is nervous/anxious and has insomnia.     Blood pressure 116/87, pulse 100, temperature 97.4 F (36.3 C), temperature source Oral, resp. rate 18, height 5\' 8"  (1.727 m), weight 87.544 kg (193 lb).Body mass index is 29.35 kg/(m^2).  General Appearance: Disheveled  Eye Contact::  Minimal  Speech:  Normal Rate  Volume:  Increased  Mood:  Angry, Anxious, Depressed, Hopeless and Irritable  Affect:  Labile  Thought Process:  Linear  Orientation:  Full (Time, Place, and Person)  Thought Content:  Hallucinations: Auditory  Suicidal Thoughts:  Yes.  with intent/plan with thoughts to bang his head to death  Homicidal Thoughts:  No  Memory:  Immediate;   Fair Recent;   Fair Remote;   Fair  Judgement:  Impaired  Insight:  Lacking  Psychomotor Activity:  Increased  Concentration:  Fair  Recall:  [employer] of Knowledge:Fair  Language: Fair  Akathisia:  No    AIMS (if indicated):   0  Assets:  Others:  access to health care  Sleep:  Number of Hours: 6.75   Musculoskeletal: Strength & Muscle Tone: within normal limits Gait & Station: normal Patient leans: N/A  Current Medications: Current Facility-Administered Medications  Medication Dose Route Frequency Provider Last Rate Last Dose  . acetaminophen (TYLENOL) tablet 650 mg  650  mg Oral Q6H PRN Laverle Hobby, PA-C      . albuterol (PROVENTIL HFA;VENTOLIN HFA) 108 (90 BASE) MCG/ACT inhaler 2 puff  2 puff Inhalation Q4H PRN Ursula Alert, MD   2 puff at 11/19/13 0827  . alum & mag hydroxide-simeth (MAALOX/MYLANTA) 200-200-20 MG/5ML suspension 30 mL  30 mL Oral Q4H PRN Laverle Hobby, PA-C      . chlordiazePOXIDE  (LIBRIUM) capsule 25 mg  25 mg Oral Q6H PRN Mojeed Akintayo      . feeding supplement (ENSURE COMPLETE) (ENSURE COMPLETE) liquid 237 mL  237 mL Oral BID BM Elyse A Shearer, RD   237 mL at 11/19/13 1400  . gabapentin (NEURONTIN) capsule 300 mg  300 mg Oral BID Ursula Alert, MD   300 mg at 11/20/13 0817  . hydrOXYzine (ATARAX/VISTARIL) tablet 25 mg  25 mg Oral Q6H PRN Mojeed Akintayo      . indomethacin (INDOCIN) capsule 50 mg  50 mg Oral BID WC Drena Ham, MD      . loperamide (IMODIUM) capsule 2-4 mg  2-4 mg Oral PRN Mojeed Akintayo      . LORazepam (ATIVAN) tablet 1 mg  1 mg Oral Q6H PRN Ursula Alert, MD      . magnesium hydroxide (MILK OF MAGNESIA) suspension 30 mL  30 mL Oral Daily PRN Laverle Hobby, PA-C      . mirtazapine (REMERON) tablet 45 mg  45 mg Oral QHS Mojeed Akintayo   45 mg at 11/19/13 2230  . multivitamin with minerals tablet 1 tablet  1 tablet Oral Daily Ursula Alert, MD   1 tablet at 11/20/13 0817  . naproxen (NAPROSYN) tablet 500 mg  500 mg Oral BID PRN Ursula Alert, MD      . nicotine (NICODERM CQ - dosed in mg/24 hours) patch 14 mg  14 mg Transdermal Daily Laverle Hobby, PA-C   14 mg at 11/20/13 0815  . ondansetron (ZOFRAN-ODT) disintegrating tablet 4 mg  4 mg Oral Q6H PRN Mojeed Akintayo      . QUEtiapine (SEROQUEL XR) 24 hr tablet 600 mg  600 mg Oral QHS Rober Skeels, MD      . thiamine (VITAMIN B-1) tablet 100 mg  100 mg Oral Daily Ursula Alert, MD   100 mg at 11/20/13 0817  . traZODone (DESYREL) tablet 100 mg  100 mg Oral QHS Ursula Alert, MD        Lab Results:  No results found for this or any previous visit (from the past 48 hour(s)).  Physical Findings: AIMS: Facial and Oral Movements Muscles of Facial Expression: None, normal Lips and Perioral Area: None, normal Jaw: None, normal Tongue: None, normal,Extremity Movements Upper (arms, wrists, hands, fingers): None, normal Lower (legs, knees, ankles, toes): None, normal, Trunk  Movements Neck, shoulders, hips: None, normal, Overall Severity Severity of abnormal movements (highest score from questions above): None, normal Incapacitation due to abnormal movements: None, normal Patient's awareness of abnormal movements (rate only patient's report): No Awareness, Dental Status Current problems with teeth and/or dentures?: No Does patient usually wear dentures?: No  CIWA:  CIWA-Ar Total: 0 COWS:     Treatment Plan Summary: Daily contact with patient to assess and evaluate symptoms and progress in treatment Medication management  Plan: Will continue  patient on CIWA as well as Librium protocol for alcohol withdrawal symptoms.Patient has significant substance use problem- Will refer to substance abuse program after stabilization.   Ref. Range  05/07/2013 23:18  05/23/2013 12:24  07/24/2013 01:56  10/12/2013 04:40  11/14/2013 17:26   Alcohol, Ethyl (B)  Latest Range: 0-11 mg/dL  133 (H)  <11  212 (H)  195 (H)  246 (H)    Will reduce remeron to 15 mg po qhs for lack of efficacy and increasing irritability. Will add Depakote 500 mg po bid for mood lability ,irritability. Will increase Seroquel XR 600mg  po qhs for psychosis,mood lability,sleep.  Will continue trazodone 50 mg po qhs. Will continue gabapentin  300 mg po bid. Will make prn's available for anxiety sx. Patient  placed on 1:1 for safety  FOR HIV -patient used to be a part of START study per ehr.  Will provide nicotine patch for smoking cessation.   Medical Decision Making Problem Points:  Established problem, worsening (2) and New problem, with no additional work-up planned (3) Data Points:  Order Aims Assessment (2) Review of medication regiment & side effects (2) Review of new medications or change in dosage (2)  I certify that inpatient services furnished can reasonably be expected to improve the patient's condition.   Danarius Mcconathy, MD 11/20/2013, 12:16 PM

## 2013-11-20 NOTE — Progress Notes (Signed)
Medical Consultation  John [name] XBM:[id number] DOB: [birth year] DOA: 11/15/2013 PCP: No PCP Per Patient   Requesting physician: Dr. Shea Evans Date of consultation: 11/20/13 Reason for consultation: HTN  Impression/Recommendations Principal Problem:   Severe major depression with psychotic features Active Problems:   Alcohol dependence   Depression Elevated Blood Pressure -pt does not have pre-existing diagnosis of HTN -pt denies any family hx of HTN -may be due to NSAIDs that were started for the patient's gout -NSAIDs cause sodium and fluid retention which can raise BP;  In addition, they inhibit vasodilating prostaglandin which can raise BP -review of record reveals that BP began to increase after he was started on Naprosyn and now indomethacin -At this time, I would mearly watch the pt's BP closely to avoid any HTN urgency and immediate complications -I would not start antiHTN meds presently unless pt develops symptoms  -would try to wean NSAIDS if possible and observe BP off NSAIDS -Other possible causes of pt's elevated BP may include--pain from gout flare, obstructive sleep apnea--pt has loud snoring when I entered his room -would monitor BP at least once each shift  HIV -will need outpt followup -presently not on meds Polysubstance Abuse -counseled to quit -plan per psychiatry Depression -per psychiatry   I will followup again tomorrow. Please contact me if I can be of assistance in the meanwhile. Thank you for this consultation.  Chief Complaint:   HPI:  43 year old male with a history of depression, HIV, gout, and polysubstance abuse was admitted to [facility] on 11/15/13 with depression and suicidal ideation. The patient was found to have cocaine and cannabis in his urine. In addition his alcohol level was 246. Since admission to [facility], the patient developed a gouty flare involving his left foot. The patient was initially started on Naprosyn on 11/18/2013. This will be  switched to indomethacin.  TRH is consulted for elevated BP. Since 11/18/2013, the patient has had elevated blood pressures with systolic up to 027 and diastolic up to 253. This correlated with the start of the patient's Naprosyn. The patient was normotensive prior to starting the medication. He presently denies any chest pain, shortness breath, palpitations, headache, visual disturbance, vomiting, diarrhea, abdominal pain. There is no hematuria, hematochezia or melena. He complains of pain in his left foot. He states that the pain in his left foot is getting better with Naprosyn but continues to be swollen. There is no family history of hypertension nor has anyone ever told the patient that he has hypertension. He is never been on any medications for blood pressure. On 11/14/2013, the patient had unremarkable CMP and CBC. Serum creatinine was 1.07 at that time.  Review of Systems:  Constitutional:  No weight loss, night sweats, Fevers, chills, fatigue.  Head&Eyes: No headache.  No vision loss.  No eye pain or scotoma ENT:  No Difficulty swallowing,Tooth/dental problems,Sore throat,  No ear ache, post nasal drip,  Cardio-vascular:  No chest pain, Orthopnea, PND, swelling in lower extremities,  dizziness, palpitations  GI:  No heartburn, indigestion, abdominal pain, vomiting, diarrhea, loss of appetite, hematochezia, melena Resp:  No shortness of breath with exertion or at rest. No excess mucus, no productive cough, No non-productive cough, No coughing up of blood.No change in color of mucus.No wheezing.No chest wall deformity  Skin:  no rash or lesions.  GU:  no dysuria, change in color of urine, no urgency or frequency. No flank pain.  Musculoskeletal:  No joint pain or swelling. No decreased range of motion.  No back pain.  Psych:  No change in mood or affect. Neurologic: No headache, no dysesthesia, no focal weakness, no vision loss. No syncope   Past Medical History  Diagnosis Date    . Asthma   . HIV (human immunodeficiency virus infection)     "dx'd ~ 2 yr ago" (09/29/2012)  . Mental disorder   . Anxiety   . Depression   . Chronic low back pain   . Alcohol abuse   . HIV disease   . Hypertension   . Asthma    Past Surgical History  Procedure Laterality Date  . Skin graft full thickness leg Left ?    POSTERIOR LEFT LEG  AFTER DOG BITES   Social History:  reports that he has been smoking Cigarettes.  He has a 13.5 pack-year smoking history. He does not have any smokeless tobacco history on file. He reports that he drinks about 29.4 ounces of alcohol per week. He reports that he uses illicit drugs (Marijuana and Cocaine) about 7 times per week.  Family History  Problem Relation Age of Onset  . Alcoholism Mother   . Alcoholism Brother     Allergies  Allergen Reactions  . Shellfish Allergy Anaphylaxis and Swelling  . Shellfish Allergy Swelling     Prior to Admission medications   Medication Sig Start Date End Date Taking? Authorizing Provider  albuterol (PROVENTIL HFA;VENTOLIN HFA) 108 (90 BASE) MCG/ACT inhaler Inhale 2 puffs into the lungs every 4 (four) hours as needed for wheezing or shortness of breath.   Yes Historical Provider, MD  mirtazapine (REMERON) 30 MG tablet Take 1 tablet (30 mg total) by mouth at bedtime. For sleep and depression. 09/28/13  Yes Elmarie Shiley, NP  QUEtiapine (SEROQUEL) 400 MG tablet Take 1 tablet (400 mg total) by mouth at bedtime. 09/28/13  Yes Elmarie Shiley, NP  traZODone (DESYREL) 50 MG tablet Take 1 tablet (50 mg total) by mouth at bedtime as needed for sleep. 09/28/13  Yes Elmarie Shiley, NP    Physical Exam: Filed Vitals:   11/19/13 2130 11/20/13 0844 11/20/13 0845 11/20/13 1208  BP: 102/74 144/96 120/106 116/87  Pulse: 93 110 124 100  Temp: 97.7 F (36.5 C) 97.4 F (36.3 C)    TempSrc: Oral Oral    Resp: 20 18    Height:      Weight:       General:  A&O x 3, NAD, nontoxic, pleasant/cooperative Head/Eye: No conjunctival  hemorrhage, no icterus, Wade/AT, No nystagmus ENT:  No icterus,  No thrush, g no pharyngeal exudate Neck:  No masses, no lymphadenpathy, no meningismus CV:  RRR, no rub, no gallop, no S3 Lung:  CTAB, good air movement, no wheeze, no rhonchi Abdomen: soft/NT, +BS, nondistended, no peritoneal signs Ext: No cyanosis, No rashes, No petechiae, No lymphangitis; there is edema about the left first MTP joint of his left foot without any crepitance or lymphangitis. There is warmth at that site.   Labs on Admission:  Basic Metabolic Panel:  Recent Labs Lab 11/14/13 1726  NA 144  K 4.1  CL 104  CO2 20  GLUCOSE 83  BUN 10  CREATININE 1.07  CALCIUM 9.0   Liver Function Tests:  Recent Labs Lab 11/14/13 1726  AST 24  ALT 18  ALKPHOS 78  BILITOT 0.5  PROT 8.2  ALBUMIN 4.2    Recent Labs Lab 11/14/13 1728  LIPASE 12   No results found for this basename: AMMONIA,  in the last 168  hours CBC:  Recent Labs Lab 11/14/13 1726 11/14/13 1728  WBC 5.4  --   NEUTROABS  --  1.3*  HGB 13.6  --   HCT 40.0  --   MCV 92.0  --   PLT 322  --    Cardiac Enzymes: No results found for this basename: CKTOTAL, CKMB, CKMBINDEX, TROPONINI,  in the last 168 hours BNP: No components found with this basename: POCBNP,  CBG: No results found for this basename: GLUCAP,  in the last 168 hours  Radiological Exams on Admission: No results found.  EKG: Independently reviewed. none  Time spent: 45 min  John [name] [facility] Pager [phone]  If 7PM-7AM, please contact night-coverage [email or web address] Password [email or web address] 11/20/2013, 4:10 PM

## 2013-11-20 NOTE — Progress Notes (Signed)
D:Pt is asleep in his bed. Respirations are even and unlabored. Earlier this morning pt expressed si with a plan to bang his head on something hard. He reported HI toward anyone. When asked why he felt that way, he reported because he was taken away from his mother. Pt requested that he get two trays. Reported to MD and received an order. A:Offered support and 1:1 observation.     R:Safety maintained on the unit.

## 2013-11-20 NOTE — Progress Notes (Signed)
Patient ID: John [name], male   DOB: January 24, 1971, 43 y.o.   MRN: [id number]  D: When asked about his day pt stated, "terrible, frustrated, dizzy and I had diarrhea". When asked why he was feeling frustrated, pt stated "lost my mom". However, pt was laying in bed and appeared to be sleeping prior to the assessment. After answering the writer's questions pt covered his head and rolled over.   A:  Support and encouragement was offered. 1:1 continued for safety.  R: Pt remains safe.

## 2013-11-20 NOTE — Progress Notes (Signed)
Pt laying in bed resting with eyes closed. Respirations even and unlabored. No distress noted. Continue to monitor 1:1 for safety. Pt remains safe.

## 2013-11-21 ENCOUNTER — Telehealth: Payer: Self-pay | Admitting: *Deleted

## 2013-11-21 DIAGNOSIS — M109 Gout, unspecified: Secondary | ICD-10-CM

## 2013-11-21 DIAGNOSIS — F331 Major depressive disorder, recurrent, moderate: Secondary | ICD-10-CM

## 2013-11-21 MED ORDER — PREDNISONE 50 MG PO TABS
60.0000 mg | ORAL_TABLET | Freq: Once | ORAL | Status: AC
  Administered 2013-11-21: 60 mg via ORAL
  Filled 2013-11-21: qty 1
  Filled 2013-11-21: qty 2

## 2013-11-21 MED ORDER — PREDNISONE 20 MG PO TABS
40.0000 mg | ORAL_TABLET | Freq: Every day | ORAL | Status: AC
  Administered 2013-11-22 – 2013-11-26 (×5): 40 mg via ORAL
  Filled 2013-11-21: qty 1
  Filled 2013-11-21 (×5): qty 2

## 2013-11-21 MED ORDER — BENZTROPINE MESYLATE 0.5 MG PO TABS
0.5000 mg | ORAL_TABLET | Freq: Two times a day (BID) | ORAL | Status: DC
  Administered 2013-11-21 – 2013-11-26 (×10): 0.5 mg via ORAL
  Filled 2013-11-21: qty 1
  Filled 2013-11-21: qty 28
  Filled 2013-11-21 (×2): qty 1
  Filled 2013-11-21: qty 28
  Filled 2013-11-21 (×11): qty 1

## 2013-11-21 MED ORDER — OLANZAPINE 5 MG PO TBDP
5.0000 mg | ORAL_TABLET | Freq: Two times a day (BID) | ORAL | Status: DC
  Administered 2013-11-21 – 2013-11-22 (×2): 5 mg via ORAL
  Filled 2013-11-21 (×4): qty 1

## 2013-11-21 MED ORDER — CLONIDINE HCL 0.1 MG PO TABS: 0.1000 mg | ORAL_TABLET | Freq: Four times a day (QID) | ORAL | Status: DC | PRN

## 2013-11-21 MED ORDER — QUETIAPINE FUMARATE ER 400 MG PO TB24
400.0000 mg | ORAL_TABLET | Freq: Every day | ORAL | Status: DC
  Administered 2013-11-21: 400 mg via ORAL
  Filled 2013-11-21 (×2): qty 1

## 2013-11-21 MED ORDER — MECLIZINE HCL 25 MG PO TABS
25.0000 mg | ORAL_TABLET | Freq: Three times a day (TID) | ORAL | Status: DC | PRN
  Administered 2013-11-21: 25 mg via ORAL
  Filled 2013-11-21: qty 1

## 2013-11-21 MED ORDER — GABAPENTIN 400 MG PO CAPS
400.0000 mg | ORAL_CAPSULE | Freq: Two times a day (BID) | ORAL | Status: DC
  Administered 2013-11-21 – 2013-11-26 (×10): 400 mg via ORAL
  Filled 2013-11-21 (×2): qty 1
  Filled 2013-11-21: qty 28
  Filled 2013-11-21 (×5): qty 1
  Filled 2013-11-21: qty 28
  Filled 2013-11-21 (×6): qty 1

## 2013-11-21 NOTE — Progress Notes (Signed)
D: Pt was in the hall. Complained of SOB. A: Pt was given his prn albuterol for use.  R: Pt responded well to treatment.

## 2013-11-21 NOTE — Plan of Care (Signed)
Problem: Ineffective individual coping Goal: STG: Patient will remain free from self harm Outcome: Progressing Pt continues to be on a 1:1  Problem: Alteration in mood Goal: STG-Patient is able to discuss feelings and issues (Patient is able to discuss feelings and issues leading to depression)  Outcome: Progressing Pt verbalized that the was having a terrible day and frustrated. However, Probation officer couldn't get pt to elaborate.

## 2013-11-21 NOTE — Progress Notes (Signed)
Patient ID: John [name], male   DOB: May 31, 1970, 43 y.o.   MRN: [id number]  1:1 Nursing Note-  Patient is seen in his bedroom laying in his bed sleeping. Patient's respirations are even and unlabored, no distress noted at this time. MHT is within reach and 1:1 is continued. Q15 minute safety checks are maintained as well.

## 2013-11-21 NOTE — Progress Notes (Signed)
Did not attend Group

## 2013-11-21 NOTE — [facility] Group Notes (Signed)
[facility] LCSW Aftercare Discharge Planning Group Note   11/21/2013 11:49 AM  Participation Quality:  Did not attend    [place]

## 2013-11-21 NOTE — B[facility] Group Notes (Signed)
[place] LCSW Group Therapy  11/21/2013 2:21 PM  Type of Therapy:  Group Therapy  Participation Level:  Did Not Attend-pt in room/refused to attend group.   Smart, Brandi Tomlinson LCSWA  11/21/2013, 2:21 PM

## 2013-11-21 NOTE — Progress Notes (Signed)
Patient ID: John [name], male   DOB: 10-20-1970, 43 y.o.   MRN: [id number]  1:1 Nursing Note-  Patient is seen at the medication window upon first approach. Patient reports that he feels "horrible." Patient received his medications and reports he is SI/HI and Auditory hallucinations. Patient states that he is not HI towards a particular person. Patient contracts for safety. Patient denies visual hallucinations. MHT is within reach and 1:1 is continued for safety. Q15 minute safety checks maintained until discharge.

## 2013-11-21 NOTE — Progress Notes (Signed)
Patient ID: John [name], male   DOB: [birth year], 43 y.o.   MRN: [id number]  1:1 Nursing Note-  Patient is observed laying in his bed snoring. Respirations are even and unlabored at this time. No distress noted at this time. MHT is within reach and 1:1 observation is continued. Q15 minute safety checks are maintained as well.

## 2013-11-21 NOTE — Plan of Care (Signed)
Problem: Ineffective individual coping Goal: STG-Increase in ability to manage activities of daily living Outcome: Not Progressing Patient does not appear to have an increase in ability to do ADL's from when he was admitted. Patient continues to have body odor and appears disheveled. Patient is mainly seen in his bed rather than out in the milieu, taking care of hygiene needs, or attending groups.

## 2013-11-21 NOTE — Progress Notes (Signed)
Patient ID: John [name], male   DOB: 04/26/1970, 42 y.o.   MRN: [id number]  1:1 Nursing Note-  Patient is seen in the dayroom eating a snack. Patient is not having any conversation at this time. Patient's respirations appear even and no distress noted. 1:1 observation is continued at this point for safety and MHT is close by observing this patient as 1:1 indicates. Q15 minute safety checks are maintained as well.

## 2013-11-21 NOTE — Progress Notes (Signed)
Patient ID: John [name], male   DOB: Jan 26, 1971, 43 y.o.   MRN: [id number] [facility] MD Progress Note  11/21/2013 12:09 PM John [name]  MRN:  [id number] Subjective: " I am suicidal and am hearing voices"  Objective: Patient was seen and his chart reviewed. Patient continues to be irritable with labile mood . Patient reports he does well when he is alone ,but when he is around people he has to control himself not to act out and hurt some one. He reports he has been trying hard to do that and has been successful till now. He continues to voice SI , and reports a plan to bang his head. He continues to hear voices  which are telling him several things, such as kill himself and kill others .  He continues to be  with minimal interaction with peers. However, he is compliant with his medications and has not verbalized any adverse reactions.  Patient to be placed on 1:1 for safety   Diagnosis:    DSM5:  Primary psychiatric diagnosis:  Unspecified Bipolar and other related disorder  Secondary psychiatric diagnosis:  Alcohol use disorder  Alcohol withdrawal on (Librium protocol) Cannabis use disorder  Stimulant (cocaine) use disoder  Tobacco use disorder   Non psychiatric diagnosis:  Asthma  HIV (Dxed in 2014)  Gout  Chronic low back pain     ADL's:  Impaired  Sleep: Fair  Appetite:  Fair  Suicidal Ideation:  Plan:  wants to bang his head to death Homicidal Ideation:  Denies   AEB (as evidenced by):  Psychiatric Specialty Exam: Physical Exam  Constitutional: He is oriented to person, place, and time. He appears well-developed and well-nourished.  HENT:  Head: Normocephalic and atraumatic.  Neurological: He is alert and oriented to person, place, and time.  Psychiatric: His speech is normal. His mood appears anxious. His affect is angry and labile. He is agitated, aggressive, actively hallucinating and combative. Thought content is paranoid. Cognition and memory are normal. He  expresses impulsivity and inappropriate judgment. He exhibits a depressed mood. He expresses suicidal ideation. He expresses suicidal plans.    Review of Systems  Constitutional: Negative.   HENT: Negative.   Eyes: Negative.   Cardiovascular: Negative.   Gastrointestinal: Negative.   Musculoskeletal: Positive for myalgias.  Skin: Negative.   Psychiatric/Behavioral: Positive for depression, suicidal ideas, hallucinations and substance abuse. The patient is nervous/anxious and has insomnia.     Blood pressure 133/90, pulse 122, temperature 97.6 F (36.4 C), temperature source Oral, resp. rate 20, height 5\' 8"  (1.727 m), weight 87.544 kg (193 lb).Body mass index is 29.35 kg/(m^2).  General Appearance: Disheveled  Eye Contact::  Minimal  Speech:  Normal Rate  Volume:  Increased  Mood:  Angry, Anxious, Depressed, Hopeless and Irritable  Affect:  Labile  Thought Process:  Linear  Orientation:  Full (Time, Place, and Person)  Thought Content:  Hallucinations: Auditory  Suicidal Thoughts:  Yes.  with intent/plan with thoughts to bang his head to death  Homicidal Thoughts:  No has been trying not hurt anyone  Memory:  Immediate;   Fair Recent;   Fair Remote;   Fair  Judgement:  Impaired  Insight:  Lacking  Psychomotor Activity:  Increased  Concentration:  Fair  Recall:  [employer] of Knowledge:Fair  Language: Fair  Akathisia:  No    AIMS (if indicated):   0  Assets:  Others:  access to health care  Sleep:  Number of Hours: 6.75  Musculoskeletal: Strength & Muscle Tone: within normal limits Gait & Station: normal Patient leans: N/A  Current Medications: Current Facility-Administered Medications  Medication Dose Route Frequency Provider Last Rate Last Dose  . acetaminophen (TYLENOL) tablet 650 mg  650 mg Oral Q6H PRN Laverle Hobby, PA-C      . albuterol (PROVENTIL HFA;VENTOLIN HFA) 108 (90 BASE) MCG/ACT inhaler 2 puff  2 puff Inhalation Q4H PRN Ursula Alert, MD   2 puff at  11/20/13 2230  . alum & mag hydroxide-simeth (MAALOX/MYLANTA) 200-200-20 MG/5ML suspension 30 mL  30 mL Oral Q4H PRN Laverle Hobby, PA-C      . benzocaine (ORAJEL) 10 % mucosal gel   Mouth/Throat QID PRN Nicholaus Bloom, MD      . benztropine (COGENTIN) tablet 0.5 mg  0.5 mg Oral BID Kanden Carey, MD      . divalproex (DEPAKOTE) DR tablet 500 mg  500 mg Oral Q12H Crystalynn Mcinerney, MD   500 mg at 11/21/13 0754  . feeding supplement (ENSURE COMPLETE) (ENSURE COMPLETE) liquid 237 mL  237 mL Oral BID BM Elyse A Shearer, RD   237 mL at 11/21/13 0949  . gabapentin (NEURONTIN) capsule 400 mg  400 mg Oral BID Ursula Alert, MD      . indomethacin (INDOCIN) capsule 50 mg  50 mg Oral BID WC Ursula Alert, MD   50 mg at 11/21/13 0754  . magnesium hydroxide (MILK OF MAGNESIA) suspension 30 mL  30 mL Oral Daily PRN Laverle Hobby, PA-C      . mirtazapine (REMERON) tablet 15 mg  15 mg Oral QHS Ursula Alert, MD   15 mg at 11/20/13 2155  . multivitamin with minerals tablet 1 tablet  1 tablet Oral Daily Ursula Alert, MD   1 tablet at 11/21/13 0754  . naproxen (NAPROSYN) tablet 500 mg  500 mg Oral BID PRN Ursula Alert, MD   500 mg at 11/20/13 1719  . nicotine (NICODERM CQ - dosed in mg/24 hours) patch 14 mg  14 mg Transdermal Daily Laverle Hobby, PA-C   14 mg at 11/21/13 0753  . OLANZapine zydis (ZYPREXA) disintegrating tablet 5 mg  5 mg Oral BID Jourdyn Ferrin, MD      . QUEtiapine (SEROQUEL XR) 24 hr tablet 400 mg  400 mg Oral QHS Jaquan Sadowsky, MD      . thiamine (VITAMIN B-1) tablet 100 mg  100 mg Oral Daily Ashlee Player, MD   100 mg at 11/21/13 0754  . traZODone (DESYREL) tablet 50 mg  50 mg Oral QHS Ursula Alert, MD        Lab Results:  No results found for this or any previous visit (from the past 48 hour(s)).  Physical Findings: AIMS: Facial and Oral Movements Muscles of Facial Expression: None, normal Lips and Perioral Area: None, normal Jaw: None, normal Tongue: None,  normal,Extremity Movements Upper (arms, wrists, hands, fingers): None, normal Lower (legs, knees, ankles, toes): None, normal, Trunk Movements Neck, shoulders, hips: None, normal, Overall Severity Severity of abnormal movements (highest score from questions above): None, normal Incapacitation due to abnormal movements: None, normal Patient's awareness of abnormal movements (rate only patient's report): No Awareness, Dental Status Current problems with teeth and/or dentures?: No Does patient usually wear dentures?: No  CIWA:  CIWA-Ar Total: 3 COWS:     Treatment Plan Summary: Daily contact with patient to assess and evaluate symptoms and progress in treatment Medication management  Plan: Will continue  patient on CIWA as  well as Librium protocol for alcohol withdrawal symptoms.Patient has significant substance use problem- Will refer to substance abuse program after stabilization.   Ref. Range  05/07/2013 23:18  05/23/2013 12:24  07/24/2013 01:56  10/12/2013 04:40  11/14/2013 17:26   Alcohol, Ethyl (B)  Latest Range: 0-11 mg/dL  133 (H)  <11  212 (H)  195 (H)  246 (H)    Will continue remeron 15 mg po qhs for lack of efficacy and increasing irritability. Will add Depakote 500 mg po bid for mood lability ,irritability. Will cross titrate Zyprexa with Seroquel. Patient is on a good dose of seroquel with no reduction in his symptoms. He continues to endorse irritability,AH as well as mood lability.Once he is stable on Zyprexa ,could consider discontinuing seroquel. Will continue Depakote 500 mg po bid for mood lability ,will get depakote level on 11/25/13. Will continue trazodone 50 mg po qhs. Will increase gabapentin to  400 mg po bid. Will make prn's available for anxiety sx. Patient  placed on 1:1 for safety  FOR HIV -patient used to be a part of START study per ehr. Per IM consult -patient to be referred for outpatient follow up for his HIV medications. Will provide nicotine patch for smoking  cessation.   Medical Decision Making Problem Points:  Established problem, worsening (2) and New problem, with no additional work-up planned (3) Data Points:  Order Aims Assessment (2) Review of medication regiment & side effects (2) Review of new medications or change in dosage (2)  I certify that inpatient services furnished can reasonably be expected to improve the patient's condition.   Ritter Helsley, MD 11/21/2013, 12:09 PM

## 2013-11-21 NOTE — Telephone Encounter (Signed)
Patient is inpatient at [facility] and they wanted to know what medication he is currently taking. Advised after speaking with Maudie Mercury as he was last on a study, was advised that the patient was to inconsistent and is no longer on study. She further advised that the patient is not now, nor has ever been on medication for his HIV. Advised the [facility] nurse of this and she wanted to get the patient in to be seen anyway and gave her an appt for him for 12/04/13 with Dr Baxter Flattery and also advised her to have him come early as he will need to speak with Sharyn Lull for the ADAP/Ryan White. Also advised him what he will need to bring to that appt so that the application can be processed.

## 2013-11-21 NOTE — Progress Notes (Signed)
[facility] PROGRESS NOTE  John [name] SAY:[id number] DOB: 06/24/1970 DOA: 11/15/2013 PCP: No PCP Per Patient  Assessment/Plan: #1 elevated blood pressure Likely secondary to acute gout pain and possible NSAID use. Patient with no pre-existing diagnosis of hypertension. Patient blood pressure was stable prior to institution of NSAIDs. Patient currently on indomethacin. Patient also currently in pain. Will discontinue indomethacin and treat acute gouty flare with a short prednisone course. Follow blood pressure. If still elevated may start low-dose Norvasc or labetalol. Will place on clonidine as needed for systolic blood pressure greater than 235 or diastolic blood pressure greater than 110.  #2 acute gouty attack Patient with complaints of left foot swelling and pain in the left first MTP joint. Likely an acute gouty flare. Due to elevated blood pressures we'll discontinue Naprosyn and indomethacin. Will place on a short course of steroids of prednisone. Follow.  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  #4 dizziness Patient complaining of a spinning sensation. Orthostatics haven't ordered a check and patient is not orthostatic. We'll place on meclizine as needed. PT/ OT full vestibular evaluation. Outpatient followup.  #5 HIV Currently not on any medications. Will need outpatient followup.  #6 history of polysubstance abuse Per primary team.    Code Status: Full Family Communication: Updated patient no family present. Disposition Plan: Per primary team   Consultants:  [facility]  Procedures:  None  Antibiotics:  None  HPI/Subjective: Patient complaining of left foot pain. Patient also complaining of dizziness with positional changes and ambulation. Patient states he has a feeling of a spinning sensation  Objective: Filed Vitals:   11/21/13 1210  BP: 153/110  Pulse: 108  Temp:   Resp:    No intake or output data  in the 24 hours ending 11/21/13 1321 Filed Weights   11/15/13 0530  Weight: 87.544 kg (193 lb)    Exam:   General:  NAD  Cardiovascular: RRR  Respiratory: CTAB  Abdomen: Soft, nontender, nondistended, positive bowel sounds.  Musculoskeletal: No clubbing cyanosis. Some swelling around the left first MTP joint of the left foot which is exquisitely tender to palpation. Some warmth.  Data Reviewed: Basic Metabolic Panel:  Recent Labs Lab 11/14/13 1726  NA 144  K 4.1  CL 104  CO2 20  GLUCOSE 83  BUN 10  CREATININE 1.07  CALCIUM 9.0   Liver Function Tests:  Recent Labs Lab 11/14/13 1726  AST 24  ALT 18  ALKPHOS 78  BILITOT 0.5  PROT 8.2  ALBUMIN 4.2    Recent Labs Lab 11/14/13 1728  LIPASE 12   No results found for this basename: AMMONIA,  in the last 168 hours CBC:  Recent Labs Lab 11/14/13 1726 11/14/13 1728  WBC 5.4  --   NEUTROABS  --  1.3*  HGB 13.6  --   HCT 40.0  --   MCV 92.0  --   PLT 322  --    Cardiac Enzymes: No results found for this basename: CKTOTAL, CKMB, CKMBINDEX, TROPONINI,  in the last 168 hours BNP (last 3 results)  Recent Labs  05/03/13 0149  PROBNP 60.7   CBG: No results found for this basename: GLUCAP,  in the last 168 hours  No results found for this or any previous visit (from the past 240 hour(s)).   Studies: No results found.  Scheduled Meds: . benztropine  0.5 mg Oral BID  . divalproex  500 mg Oral Q12H  . feeding supplement (ENSURE COMPLETE)  237 mL Oral BID BM  . gabapentin  400 mg Oral BID  . indomethacin  50 mg Oral BID WC  . mirtazapine  15 mg Oral QHS  . multivitamin with minerals  1 tablet Oral Daily  . nicotine  14 mg Transdermal Daily  . OLANZapine zydis  5 mg Oral BID  . QUEtiapine  400 mg Oral QHS  . thiamine  100 mg Oral Daily  . traZODone  50 mg Oral QHS   Continuous Infusions:   Principal Problem:   Severe major depression with psychotic features Active Problems:   Alcohol  dependence   Depression   Elevated BP   Gouty arthritis    Time spent: 35 minutes    [facility] MD [facility] Pager [phone]. If 7PM-7AM, please contact night-coverage at [email or web address], password [facility] 11/21/2013, 1:21 PM  LOS: 6 days

## 2013-11-22 DIAGNOSIS — F323 Major depressive disorder, single episode, severe with psychotic features: Secondary | ICD-10-CM

## 2013-11-22 DIAGNOSIS — R42 Dizziness and giddiness: Secondary | ICD-10-CM | POA: Diagnosis not present

## 2013-11-22 MED ORDER — OLANZAPINE 10 MG PO TBDP
10.0000 mg | ORAL_TABLET | Freq: Two times a day (BID) | ORAL | Status: DC
  Administered 2013-11-22 – 2013-11-23 (×2): 10 mg via ORAL
  Filled 2013-11-22 (×4): qty 1

## 2013-11-22 MED ORDER — MECLIZINE HCL 25 MG PO TABS
25.0000 mg | ORAL_TABLET | Freq: Three times a day (TID) | ORAL | Status: AC
  Administered 2013-11-22 – 2013-11-24 (×6): 25 mg via ORAL
  Filled 2013-11-22 (×6): qty 1

## 2013-11-22 MED ORDER — ONDANSETRON 4 MG PO TBDP
4.0000 mg | ORAL_TABLET | Freq: Three times a day (TID) | ORAL | Status: DC | PRN
  Administered 2013-11-22: 4 mg via ORAL

## 2013-11-22 MED ORDER — ONDANSETRON 4 MG PO TBDP
ORAL_TABLET | ORAL | Status: AC
  Administered 2013-11-22: 4 mg via ORAL
  Filled 2013-11-22: qty 1

## 2013-11-22 MED ORDER — QUETIAPINE FUMARATE ER 200 MG PO TB24
200.0000 mg | ORAL_TABLET | Freq: Every day | ORAL | Status: DC
Start: 2013-11-22 — End: 2013-11-23
  Administered 2013-11-22: 200 mg via ORAL
  Filled 2013-11-22 (×2): qty 1

## 2013-11-22 NOTE — Progress Notes (Signed)
Patient ID: John [name], male   DOB: 1970-12-21, 43 y.o.   MRN: [id number]  D: When asked about his day pt stated, "so far so good". Stated, "I know I need to go to meetings, but I don't wanna lash out on somebody that didn't do anything to me. So I just stay to myself because it's better." Writer asked pt if he's still feeling the same as when he was admitted. Pt stated "no it's a little better".  A:  Support and encouragement was offered. 1:1 continued for safety.  R: Pt remains safe.

## 2013-11-22 NOTE — Progress Notes (Signed)
[facility] 1:1 Observation Documentation  For the first (8) hours following discontinuation of 1:1 precautions, a progress note entry by nursing staff should be documented at least every 2 hours, reflecting the patient's behavior, condition, mood, and conversation.  Use the progress notes for additional entries.  Time 1:1 discontinued:  0825  Patient's Behavior:  Patient is laying in bed resting but awake. Patient is not moving but rather trying to fall back asleep.  Patient's Condition:  No distress noted in this patient. Patient is not vomiting at this time. Respirations are even and unlabored.   Patient's Conversation:  Probation officer asked patient if his stomach was feeling better (pertaining to him receiving nausea medication). Patient stated, "no."  Marlon Vonruden E 11/22/2013, 3:12 PM

## 2013-11-22 NOTE — Progress Notes (Addendum)
D: Pt presents blunted in affect and depressed mood. Pt had very minimal interaction within the milieu this evening. Pt questioned his decrease dose of Seroquel. Pt was informed of the rationale behind his decreased dose of Seroquel, per MD notes. Pt verbalizes that he plans to further discuss this matter with his Psychiatrist. Pt was receptive to the information. Pt denied any HI/AVH. Pt endorses suicidal ideation. Pt verbally contracts for safety.  A: Writer administered scheduled and prn medications to pt. Continued support and availability as needed was extended to this pt. Staff continue to monitor pt with q61min checks.  R: No adverse drug reactions noted. Pt receptive to treatment. Pt remains safe at this time.

## 2013-11-22 NOTE — Evaluation (Signed)
Physical Therapy Evaluation Patient Details Name: John [name] MRN: [id number] DOB: Jul 24, 1970 Today's Date: 11/22/2013   History of Present Illness  43 year old male with a history of depression, HIV, gout, and polysubstance abuse was admitted to [facility] on 11/15/13 with depression and suicidal ideation  Clinical Impression  Pt currently with functional limitations due to the deficits listed below (see PT Problem List).  Pt will benefit from skilled PT to increase their independence and safety with mobility to allow discharge to the venue listed below.    Vestibular exam:  Performed modified dix hallpike and negative for nystagmus however pt reports short lasting spinning sensation bilaterally.  Horizontal canals testing also negative for nystagmus and pt reports spinning bilaterally however states left side worse then right.  Smooth pursuits normal however pt reports increased dizziness looking toward right and with upward and downward gaze.  Head shaking positive horizontally.  Overshooting with saccades observed which also made pt dizzy.  VOR thrust also produced difficulty focusing and dizziness. Pt educated on habituation with performing modified dix hallpike x5 to both sides. Pt would best benefit from f/u outpatient vestibular therapy however recognize pt not likely to f/u (per RN) and may not have means to do so, so will return to provide further education and bring handouts next visit.     Follow Up Recommendations Outpatient PT    Equipment Recommendations  Rolling walker with 5" wheels    Recommendations for Other Services       Precautions / Restrictions Precautions Precautions: Fall      Mobility  Bed Mobility Overal bed mobility: Needs Assistance Bed Mobility: Supine to Sit;Sit to Supine     Supine to sit: Supervision Sit to supine: Supervision   General bed mobility comments: pt able to perform on his own with increased time due to dizziness, assisted trunk for  better stability during vestibular testing  Transfers Overall transfer level: Needs assistance Equipment used: None Transfers: Sit to/from Stand Sit to Stand: Min guard         General transfer comment: posterior lean upon rise, pt used posterior legs against bed to stabilize, slight shakiness in LEs upon standing however lasted 5 seconds  Ambulation/Gait Ambulation/Gait assistance: Min guard Ambulation Distance (Feet): 150 Feet Assistive device: None Gait Pattern/deviations: Step-through pattern;Decreased stride length Gait velocity: decr   General Gait Details: slower unsteady gait, pt usually walks along hand rail, discussed using hand rail while in [facility] to go group and meals however will ask nsg if RW is allowed, educated to look straight ahead at stationary object in distance to assist with decreasing dizziness, dizziness reported 6/10  Stairs            Wheelchair Mobility    Modified Rankin (Stroke Patients Only)       Balance                                             Pertinent Vitals/Pain Pain Assessment: No/denies pain    Home Living Family/patient expects to be discharged to:: Private residence Living Arrangements: Non-relatives/Friends             Home Equipment: None      Prior Function Level of Independence: Independent               Hand Dominance        Extremity/Trunk Assessment  Lower Extremity Assessment: Generalized weakness         Communication   Communication: No difficulties  Cognition Arousal/Alertness: Awake/alert Behavior During Therapy: WFL for tasks assessed/performed Overall Cognitive Status: Within Functional Limits for tasks assessed                      General Comments      Exercises        Assessment/Plan    PT Assessment Patient needs continued PT services  PT Diagnosis Difficulty walking (vertigo)   PT Problem List Decreased  balance;Decreased mobility (vertigo)  PT Treatment Interventions DME instruction;Gait training;Functional mobility training;Therapeutic activities;Therapeutic exercise;Patient/family education (vestibular exercises, habituation, compensation)   PT Goals (Current goals can be found in the Care Plan section) Acute Rehab PT Goals PT Goal Formulation: With patient Time For Goal Achievement: 11/29/13 Potential to Achieve Goals: Good    Frequency Min 2X/week   Barriers to discharge        Co-evaluation               End of Session   Activity Tolerance: Patient tolerated treatment well Patient left: in bed Nurse Communication: Mobility status    Functional Assessment Tool Used: clinical judgement, negative for nystagmus with modified dix hallpike Functional Limitation: Mobility: Walking and moving around Mobility: Walking and Moving Around Current Status (Q8250): At least 1 percent but less than 20 percent impaired, limited or restricted Mobility: Walking and Moving Around Goal Status [phone]): 0 percent impaired, limited or restricted    Time: 1449-1519 PT Time Calculation (min): 30 min   Charges:   PT Evaluation $Initial PT Evaluation Tier I: 1 Procedure PT Treatments $Gait Training: 8-22 mins   PT G Codes:   Functional Assessment Tool Used: clinical judgement, negative for nystagmus with modified dix hallpike Functional Limitation: Mobility: Walking and moving around    [place] E 11/22/2013, 3:53 PM Carmelia Bake, PT, DPT 11/22/2013 Pager: [phone]
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
## 2013-11-22 NOTE — Progress Notes (Signed)
[place] [facility] 1:1 Observation Documentation  For the first (8) hours following discontinuation of 1:1 precautions, a progress note entry by nursing staff should be documented at least every 2 hours, reflecting the patient's behavior, condition, mood, and conversation.  Use the progress notes for additional entries.  Time 1:1 discontinued:  0825  Patient's Behavior:  Patient is lying in his bed sleeping.   Patient's Condition:  No distress noted. Patient's respirations are even and unlabored.   Patient's Conversation:  No conversation at this time.  Gaylan Gerold E 11/22/2013, 11:49 AM

## 2013-11-22 NOTE — Progress Notes (Signed)
[place] [facility] 1:1 Observation Documentation  For the first (8) hours following discontinuation of 1:1 precautions, a progress note entry by nursing staff should be documented at least every 2 hours, reflecting the patient's behavior, condition, mood, and conversation.  Use the progress notes for additional entries.  Time 1:1 discontinued:  0825  Patient's Behavior:  Patient is in the bathroom taking a shower.   Patient's Condition:  No distress noted or reported from patient at this time.  Patient's Conversation:  No conversation at this time.   Gaylan Gerold E 11/22/2013, 8:32 AM

## 2013-11-22 NOTE — [facility] Group Notes (Signed)
[facility] Group Notes:  (Nursing/MHT/Case Management/Adjunct)  Date:  11/22/2013  Time:  11:55 AM  Type of Therapy:  Nurse Education  Participation Level:  Minimal  Participation Quality:  Drowsy and Inattentive  Affect:  Blunted  Cognitive:  Appropriate  Insight:  None  Engagement in Group:  Lacking  Modes of Intervention:  Discussion and Education  Summary of Progress/Problems: Writer spoke to the theme of the day, which is leisure and lifestyle activity. Focusing primarily on medication compliance, nutrition, and better ways for patients to take better care of themselves. Patient appeared to be listening to writer at times but mostly was focused on eating his ice cream.  Cathyrn Deas E 11/22/2013, 11:55 AM

## 2013-11-22 NOTE — Progress Notes (Signed)
Pt requested medication for feeling like his was going to vomit. He was given 4mg  of zofran ODT at 1:30pm. Pt left group and asked if he could go back to bed stating he did not feel good.

## 2013-11-22 NOTE — Plan of Care (Signed)
Problem: Diagnosis: Increased Risk For Suicide Attempt Goal: LTG-Patient Will Show Positive Response to Medication LTG (by discharge) : Patient will show positive response to medication and will participate in the development of the discharge plan.  Outcome: Progressing Patient appears in better spirits today. Patient is attending more groups and is seen more in the milieu. Patient's discharge was D/C allowing patient to go down for meals.

## 2013-11-22 NOTE — Progress Notes (Signed)
[place] [facility] 1:1 Observation Documentation  For the first (8) hours following discontinuation of 1:1 precautions, a progress note entry by nursing staff should be documented at least every 2 hours, reflecting the patient's behavior, condition, mood, and conversation.  Use the progress notes for additional entries.  Time 1:1 discontinued:  0825  Patient's Behavior: Patient is laying in bed practicing his 'homework' that was given to him by PT/OT. Patient is calm at this time and focused on his exercises.   Patient's Condition:  No distress noted. Respirations are even and unlabored.  Patient's Conversation:  No conversation at this time.  Gaylan Gerold E 11/22/2013, 4:33 PM
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
## 2013-11-22 NOTE — BHH Group Notes (Signed)
[place] LCSW Group Therapy  11/22/2013 3:47 PM   Type of Therapy:  Group Therapy  Participation Level:  Active  Participation Quality:  Attentive  Affect:  Appropriate  Cognitive:  Appropriate  Insight:  Improving  Engagement in Therapy:  Engaged  Modes of Intervention:  Clarification, Education, Exploration and Socialization  Summary of Progress/Problems: Today's group focused on relapse prevention.  We defined the term, and then brainstormed on ways to prevent relapse.  John [name] attended group and was an active participant for the time he was in group.  He talked about focusing on the positives by playing with kids, outdoors activities and "screwing."   He got up and left after the first 15 minutes and did not return.  Roque Lias B 11/22/2013 , 3:47 PM

## 2013-11-22 NOTE — Progress Notes (Signed)
D: Pt informed the writer that he didn't sleep well, so was attempting to get more sleep when writer asked him to come to the med window. Writer informed pt to stay awake today rather than sleep, in hopes of sleeping better tonight.   A: Pt acknowledged understanding.  Support and encouragement was offered. 1:1 continued for safety.  R: Pt remains safe.

## 2013-11-22 NOTE — Progress Notes (Signed)
[place] [facility] 1:1 Observation Documentation  For the first (8) hours following discontinuation of 1:1 precautions, a progress note entry by nursing staff should be documented at least every 2 hours, reflecting the patient's behavior, condition, mood, and conversation.  Use the progress notes for additional entries.  Time 1:1 discontinued:  0825  Patient's Behavior:  Patient is sitting quietly in the cafeteria eating lunch. Patient is calm at this time.  Patient's Condition: No distress noted. Patient is calm and no distress noted. Respirations are even and unlabored.  Patient's Conversation:  No conversation at this time.  Gaylan Gerold E 11/22/2013, 12:31 PM

## 2013-11-22 NOTE — Progress Notes (Signed)
[facility] PROGRESS NOTE  John [name] WCH:[id number] DOB: [birth year] DOA: 11/15/2013 PCP: No PCP Per Patient  Assessment/Plan: #1 elevated blood pressure Likely secondary to acute gout pain and possible NSAID use. Patient with no pre-existing diagnosis of hypertension. Patient blood pressure was stable prior to institution of NSAIDs. Patient currently on indomethacin. Patient was also in pain. Discontinued indomethacin and treated acute gouty flare with a short prednisone course. BP with some improvement. Follow blood pressure. If still elevated may start low-dose Norvasc or labetalol. Will place on clonidine as needed for systolic blood pressure greater than 353 or diastolic blood pressure greater than 110.  #2 acute gouty attack Clinical improvement. Patient had complained of left foot swelling and pain in the left first MTP joint. Likely an acute gouty flare. Due to elevated blood pressures  Naprosyn and indomethacin were discontinued. Significant clinical improvement on steriods. Continue short course of prednisone. Post acute flare will likely need to be on colchicine and allopurinol. Outpatient follow up. Follow.  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  #4 dizziness/??Vertigo Patient complaining of a spinning sensation. Orthostatics have been checked and patient is not orthostatic. Will place on scheduled meclizine x 2 days. PT/ OT full vestibular evaluation. Outpatient followup.  #5 HIV Currently not on any medications. Will need outpatient followup.  #6 history of polysubstance abuse Per primary team.    Code Status: Full Family Communication: Updated patient no family present. Disposition Plan: Per primary team   Consultants:  [facility]  Procedures:  None  Antibiotics:  None  HPI/Subjective: Patient states left foot pain improved. Patient also complaining of dizziness with a feeling of a spinning sensation.  Patient however ambulating well.  Objective: Filed Vitals:   11/22/13 1144  BP: 142/93  Pulse: 105  Temp:   Resp:    No intake or output data in the 24 hours ending 11/22/13 1349 Filed Weights   11/15/13 0530  Weight: 87.544 kg (193 lb)    Exam:   General:  NAD  Cardiovascular: RRR  Respiratory: CTAB  Abdomen: Soft, nontender, nondistended, positive bowel sounds.  Musculoskeletal: No clubbing cyanosis. Decreased swelling around the left first MTP joint of the left foot which is significantly less tender to palpation. No warmth.  Data Reviewed: Basic Metabolic Panel: No results found for this basename: NA, K, CL, CO2, GLUCOSE, BUN, CREATININE, CALCIUM, MG, PHOS,  in the last 168 hours Liver Function Tests: No results found for this basename: AST, ALT, ALKPHOS, BILITOT, PROT, ALBUMIN,  in the last 168 hours No results found for this basename: LIPASE, AMYLASE,  in the last 168 hours No results found for this basename: AMMONIA,  in the last 168 hours CBC: No results found for this basename: WBC, NEUTROABS, HGB, HCT, MCV, PLT,  in the last 168 hours Cardiac Enzymes: No results found for this basename: CKTOTAL, CKMB, CKMBINDEX, TROPONINI,  in the last 168 hours BNP (last 3 results)  Recent Labs  05/03/13 0149  PROBNP 60.7   CBG: No results found for this basename: GLUCAP,  in the last 168 hours  No results found for this or any previous visit (from the past 240 hour(s)).   Studies: No results found.  Scheduled Meds: . benztropine  0.5 mg Oral BID  . divalproex  500 mg Oral Q12H  . feeding supplement (ENSURE COMPLETE)  237 mL Oral BID BM  . gabapentin  400 mg Oral BID  . meclizine  25 mg Oral TID  .  mirtazapine  15 mg Oral QHS  . multivitamin with minerals  1 tablet Oral Daily  . nicotine  14 mg Transdermal Daily  . OLANZapine zydis  10 mg Oral BID  . ondansetron      . predniSONE  40 mg Oral QAC breakfast  . QUEtiapine  200 mg Oral QHS  . thiamine  100 mg  Oral Daily  . traZODone  50 mg Oral QHS   Continuous Infusions:   Principal Problem:   Severe major depression with psychotic features Active Problems:   Alcohol dependence   Depression   Elevated BP   Gouty arthritis   Dizziness    Time spent: 35 minutes    [facility] MD [facility] Pager [phone]. If 7PM-7AM, please contact night-coverage at [email or web address], password [facility] 11/22/2013, 1:49 PM  LOS: 7 days

## 2013-11-23 MED ORDER — MIRTAZAPINE 7.5 MG PO TABS
7.5000 mg | ORAL_TABLET | Freq: Every day | ORAL | Status: DC
  Administered 2013-11-23 – 2013-11-25 (×3): 7.5 mg via ORAL
  Filled 2013-11-23 (×3): qty 1
  Filled 2013-11-23: qty 14
  Filled 2013-11-23 (×2): qty 1

## 2013-11-23 MED ORDER — DIVALPROEX SODIUM ER 500 MG PO TB24: 500.0000 mg | ORAL_TABLET | Freq: Every day | ORAL | Status: DC

## 2013-11-23 MED ORDER — OLANZAPINE 10 MG PO TBDP
10.0000 mg | ORAL_TABLET | Freq: Every day | ORAL | Status: DC
Start: 2013-11-24 — End: 2013-11-26
  Administered 2013-11-24 – 2013-11-26 (×3): 10 mg via ORAL
  Filled 2013-11-23: qty 1
  Filled 2013-11-23: qty 14
  Filled 2013-11-23 (×4): qty 1

## 2013-11-23 MED ORDER — QUETIAPINE FUMARATE ER 50 MG PO TB24
100.0000 mg | ORAL_TABLET | Freq: Every day | ORAL | Status: DC
Start: 2013-11-23 — End: 2013-11-26
  Administered 2013-11-23 – 2013-11-25 (×3): 100 mg via ORAL
  Filled 2013-11-23 (×4): qty 2

## 2013-11-23 MED ORDER — OLANZAPINE 5 MG PO TBDP
15.0000 mg | ORAL_TABLET | Freq: Every day | ORAL | Status: DC
Start: 2013-11-23 — End: 2013-11-26
  Administered 2013-11-23 – 2013-11-25 (×3): 15 mg via ORAL
  Filled 2013-11-23: qty 42
  Filled 2013-11-23 (×4): qty 3

## 2013-11-23 NOTE — BHH Group Notes (Signed)
[facility] LCSW Group Therapy  11/23/2013  1:05 PM  Type of Therapy:  Group therapy  Participation Level: Invited, chose to not attend  Summary of Progress/Problems:  Chaplain was here to lead a group on themes of hope and courage.  Roque Lias B 11/23/2013 1:30 PM

## 2013-11-23 NOTE — BHH Group Notes (Signed)
[facility] LCSW Aftercare Discharge Planning Group Note   11/23/2013 9:52 AM  Participation Quality:  Minimal  Mood/Affect:  Irritable  Depression Rating:  unk  Anxiety Rating:  unk  Thoughts of Suicide:  No Will you contract for safety?   NA  Current AVH:  Yes  Plan for Discharge/Comments:  Merry Proud is grumpy.  He turned his back to the group and rested his arms and head on the piano.  When asked what was going on, he stated his head and stomach hurt because of medication he got yesterday.  He stated the medication had been changed.  No further interaction.  Transportation Means: bus  Supports: aunt  [place], Ernestine Mcmurray

## 2013-11-23 NOTE — Progress Notes (Signed)
Patient ID: John [name], male   DOB: Feb 08, 1971, 43 y.o.   MRN: [id number] John [name] presents with irritable, depressed mood, affect congruent. Pt continues to report persistent auditory hallucinations, and suicidal ideation/ thoughts of self harm. . He states '' I am doing better, my nerves are better, but I'm still hearing the voices sometimes and I saw pink dolphins last night. But No plans to hurt myself, I will let you know. My toe is feeling better'' A.Medications given as ordered. Support and encouragement provided. [name] continues to be isolative to his room throughout shift thus far. Discussed above information with Dr. Shea Evans. R. Patient is safe. Pt is in no acute distress at this time, with no further voiced concerns. Will continue to monitor q 15 minutes for safety.

## 2013-11-23 NOTE — Progress Notes (Signed)
[facility] Group Notes:  (Nursing/MHT/Case Management/Adjunct)  Date:  11/23/2013  Time:  9:25 PM  Type of Therapy:  Psychoeducational Skills  Participation Level:  None  Participation Quality:  Resistant  Affect:  Depressed  Cognitive:  Lacking  Insight:  None  Engagement in Group:  None  Modes of Intervention:  Education  Summary of Progress/Problems: The patient refused to share in group this evening.   Archie Balboa S 11/23/2013, 9:25 PM

## 2013-11-23 NOTE — Progress Notes (Signed)
Patient ID: John [name], male   DOB: February 23, 1971, 43 y.o.   MRN: [id number] [facility] MD Progress Note  11/23/2013 12:51 PM John [name]  MRN:  [id number] Subjective: " I am better"  Objective: Patient was seen and his chart reviewed. Patient more cooperative today. Patient continues to report AH as well as SI with a plan to jump out of the window. Patient reports VH of pink dolphins in the hallway (unsure if these are true). Patient contracts for safety. Per staff patient continues to have periods of irritability and continues to endorse AH ,but he is improving. He is off his 1:1.He is able to contract for safety. He is attending some groups.   Diagnosis:    DSM5:  Primary psychiatric diagnosis:  Unspecified Bipolar and other related disorder  Secondary psychiatric diagnosis:  Alcohol use disorder  Alcohol withdrawal on (Librium protocol) Cannabis use disorder  Stimulant (cocaine) use disoder  Tobacco use disorder   Non psychiatric diagnosis:  Asthma  HIV (Dxed in 2014)  Gout  Chronic low back pain     ADL's:  Impaired  Sleep: Fair  Appetite:  Fair  Suicidal Ideation:  Reports SI with plan to jump out of his window.  ,but contracts for safety. Homicidal Ideation:  Denies   AEB (as evidenced by):  Psychiatric Specialty Exam: Physical Exam  Constitutional: He is oriented to person, place, and time. He appears well-developed and well-nourished.  HENT:  Head: Normocephalic and atraumatic.  Neurological: He is alert and oriented to person, place, and time.  Psychiatric: His speech is normal. His mood appears anxious (improving). His affect is labile. He is aggressive (at times) and actively hallucinating (improving). Thought content is paranoid. Cognition and memory are normal. He expresses impulsivity. He exhibits a depressed mood. He expresses suicidal ideation. He expresses suicidal plans.    Review of Systems  Constitutional: Negative.   HENT: Negative.   Eyes:  Negative.   Cardiovascular: Negative.   Gastrointestinal: Negative.   Musculoskeletal: Positive for joint pain and myalgias.  Skin: Negative.   Psychiatric/Behavioral: Positive for depression, suicidal ideas, hallucinations and substance abuse. The patient is nervous/anxious.     Blood pressure 145/89, pulse 108, temperature 98 F (36.7 C), temperature source Oral, resp. rate 21, height 5\' 8"  (1.727 m), weight 87.544 kg (193 lb), SpO2 99.00%.Body mass index is 29.35 kg/(m^2).  General Appearance: Disheveled  Eye Sport and exercise psychologist::  Fair  Speech:  Normal Rate  Volume:  Normal  Mood:  Anxious and Irritable  Affect:  Labile  Thought Process:  Linear  Orientation:  Full (Time, Place, and Person)  Thought Content:  Hallucinations: Auditory reduced  Suicidal Thoughts:  Yes.  without intent/plan contracts for safety  Homicidal Thoughts:  No has been trying not hurt anyone  Memory:  Immediate;   Fair Recent;   Fair Remote;   Fair  Judgement:  Impaired  Insight:  Lacking  Psychomotor Activity:  Normal  Concentration:  Fair  Recall:  Gleason: Fair  Akathisia:  No    AIMS (if indicated):   0  Assets:  Others:  access to health care  Sleep:  Number of Hours: 4.25   Musculoskeletal: Strength & Muscle Tone: within normal limits Gait & Station: normal Patient leans: N/A  Current Medications: Current Facility-Administered Medications  Medication Dose Route Frequency Provider Last Rate Last Dose  . acetaminophen (TYLENOL) tablet 650 mg  650 mg Oral Q6H PRN Laverle Hobby, PA-C      .  albuterol (PROVENTIL HFA;VENTOLIN HFA) 108 (90 BASE) MCG/ACT inhaler 2 puff  2 puff Inhalation Q4H PRN Ursula Alert, MD   2 puff at 11/22/13 2131  . alum & mag hydroxide-simeth (MAALOX/MYLANTA) 200-200-20 MG/5ML suspension 30 mL  30 mL Oral Q4H PRN Laverle Hobby, PA-C      . benzocaine (ORAJEL) 10 % mucosal gel   Mouth/Throat QID PRN Nicholaus Bloom, MD      . benztropine  (COGENTIN) tablet 0.5 mg  0.5 mg Oral BID Ursula Alert, MD   0.5 mg at 11/23/13 0807  . cloNIDine (CATAPRES) tablet 0.1 mg  0.1 mg Oral Q6H PRN Eugenie Filler, MD      . divalproex (DEPAKOTE) DR tablet 500 mg  500 mg Oral Q12H Ursula Alert, MD   500 mg at 11/23/13 0807  . feeding supplement (ENSURE COMPLETE) (ENSURE COMPLETE) liquid 237 mL  237 mL Oral BID BM Elyse A Shearer, RD   237 mL at 11/22/13 0942  . gabapentin (NEURONTIN) capsule 400 mg  400 mg Oral BID Ursula Alert, MD   400 mg at 11/23/13 0807  . magnesium hydroxide (MILK OF MAGNESIA) suspension 30 mL  30 mL Oral Daily PRN Laverle Hobby, PA-C      . meclizine (ANTIVERT) tablet 25 mg  25 mg Oral TID Eugenie Filler, MD   25 mg at 11/23/13 1145  . mirtazapine (REMERON) tablet 7.5 mg  7.5 mg Oral QHS Tristine Langi, MD      . multivitamin with minerals tablet 1 tablet  1 tablet Oral Daily Ursula Alert, MD   1 tablet at 11/23/13 0807  . nicotine (NICODERM CQ - dosed in mg/24 hours) patch 14 mg  14 mg Transdermal Daily Laverle Hobby, PA-C   14 mg at 11/23/13 0807  . [START ON 11/24/2013] OLANZapine zydis (ZYPREXA) disintegrating tablet 10 mg  10 mg Oral Daily Taliya Mcclard, MD      . OLANZapine zydis (ZYPREXA) disintegrating tablet 15 mg  15 mg Oral QHS Addison Whidbee, MD      . ondansetron (ZOFRAN-ODT) disintegrating tablet 4 mg  4 mg Oral Q8H PRN Ursula Alert, MD   4 mg at 11/22/13 1330  . predniSONE (DELTASONE) tablet 40 mg  40 mg Oral QAC breakfast Eugenie Filler, MD   40 mg at 11/23/13 0165  . QUEtiapine (SEROQUEL XR) 24 hr tablet 100 mg  100 mg Oral QHS Vernadine Coombs, MD      . thiamine (VITAMIN B-1) tablet 100 mg  100 mg Oral Daily Ursula Alert, MD   100 mg at 11/23/13 0807  . traZODone (DESYREL) tablet 50 mg  50 mg Oral QHS Ursula Alert, MD   50 mg at 11/22/13 2129    Lab Results:  No results found for this or any previous visit (from the past 48 hour(s)).  Physical Findings: AIMS: Facial and Oral  Movements Muscles of Facial Expression: None, normal Lips and Perioral Area: None, normal Jaw: None, normal Tongue: None, normal,Extremity Movements Upper (arms, wrists, hands, fingers): None, normal Lower (legs, knees, ankles, toes): None, normal, Trunk Movements Neck, shoulders, hips: None, normal, Overall Severity Severity of abnormal movements (highest score from questions above): None, normal Incapacitation due to abnormal movements: None, normal Patient's awareness of abnormal movements (rate only patient's report): No Awareness, Dental Status Current problems with teeth and/or dentures?: No Does patient usually wear dentures?: No  CIWA:  CIWA-Ar Total: 0 COWS:     Treatment Plan Summary: Daily contact  with patient to assess and evaluate symptoms and progress in treatment Medication management  Plan: Will continue  patient on CIWA as well as Librium protocol for alcohol withdrawal symptoms.Patient has significant substance use problem- Will refer to substance abuse program after stabilization.   Ref. Range  05/07/2013 23:18  05/23/2013 12:24  07/24/2013 01:56  10/12/2013 04:40  11/14/2013 17:26   Alcohol, Ethyl (B)  Latest Range: 0-11 mg/dL  133 (H)  <11  212 (H)  195 (H)  246 (H)    Will reduce remeron to 7.5 mg po qhs for lack of efficacy and irritability. Depakote 500 mg po bid for mood lability ,irritability was started on 11/20/13. Will order Depakote level for tomorrow AM. Will cross titrate Zyprexa with Seroquel. Patient is on a good dose of seroquel with no reduction in his symptoms. He continues to endorse irritability,AH as well as mood lability.Once he is stable on Zyprexa ,could consider discontinuing seroquel. Will continue trazodone 50 mg po qhs for sleep. Remeron will also help with sleep. Could DC either one once he is more stable. Will continue gabapentin 400 mg po bid. Will make prn's available for anxiety sx.   FOR HIV -patient used to be a part of START study per  ehr. Per IM consult -patient to be referred for outpatient follow up for his HIV medications. Patients medical problems being managed by IM -please see IM consult note. Will provide nicotine patch for smoking cessation.   Medical Decision Making Problem Points:  Established problem, worsening (2) and New problem, with no additional work-up planned (3) Data Points:  Order Aims Assessment (2) Review of medication regiment & side effects (2) Review of new medications or change in dosage (2)  I certify that inpatient services furnished can reasonably be expected to improve the patient's condition.   Branndon Tuite, MD 11/23/2013, 12:51 PM

## 2013-11-23 NOTE — Tx Team (Signed)
Interdisciplinary Treatment Plan Update (Adult)   Date: 11/23/2013   Time Reviewed: 10:00 AM  Progress in Treatment:  Attending groups: Yes  Participating in groups:  Minimally Taking medication as prescribed: Yes  Tolerating medication: Yes  Family/Significant othe contact made: SPE completed with pt's aunt. SPI pamphlet provided to pt as well.  Patient understands diagnosis: Yes, AEB seeking treatment for mood stabilization/depression, AVH, SI with plan, and HI with no plan/no specific target, and medication stabilization.  Discussing patient identified problems/goals with staff: Yes  Medical problems stabilized or resolved: Yes   Denies suicidal/homicidal ideation:Yes, during group/self report.  New problem(s) identified:  Discharge Plan or Barriers: Pt plans to return to his aunt's home at d/c and follow up at [facility] for [facility] health services. Pt reports minimal alcohol consumption and daily marijuana use. He reports no desire to stop drinking/smoking marijuana and is not open to further referrals. CSW provided pt with [employer] packet.  Additional comments: n/a Reason for Continuation of Hospitalization: Mood stabilization Med management AVH Estimated length of stay:2-3 days (reassess on Monday) For review of initial/current patient goals, please see plan of care.  Attendees:  Patient:    Family:    Physician: Dr. Shea Evans MD 11/23/2013 9:59 AM   Nursing: Marye Round Rn  11/23/2013 9:59 AM   Clinical Social Worker [place], [place]  11/23/2013 9:59 AM   Other: Roque Lias LCSW 11/23/2013 9:59 AM   Other: Mendel Ryder RN 11/23/2013 9:59 AM   Other: York Spaniel LCSW 11/23/2013 9:59 AM   Other: Bonnye Fava, [place] Intern  11/23/2013 9:58 AM   Scribe for Treatment Team:  [place] LCSWA 11/23/2013 10:00 AM

## 2013-11-23 NOTE — Progress Notes (Signed)
Physical Therapy Treatment and Acute PT Discharge Patient Details Name: Jonah Gingras MRN: [id number] DOB: [birth year] Today's Date: 11/23/2013    History of Present Illness 43 year old male with a history of depression, HIV, gout, and polysubstance abuse was admitted to [facility] on 11/15/13 with depression and suicidal ideation    PT Comments    Pt reports no dizziness with Brandt-Daroff or ambulation today.  Pt steady when ambulating and required no assistive device however does report needing to use RW this morning.  Pt educated on a [employer] exercise however had no dizziness with this today.   Pt also educated on 2 gaze stability exercises which did invoke dizziness so provided handouts and recommended pt perform 2x/day until no longer experiencing dizziness with exercises.  Pt stating no dizziness with mobility today.  Since pt has met goals and provided handouts for exercises if dizziness remain, will d/c from acute PT at this time.  Recommended pt continue to use RW if dizziness returns for safety.   Follow Up Recommendations  No PT follow up     Equipment Recommendations  None recommended by PT    Recommendations for Other Services       Precautions / Restrictions Precautions Precautions: Fall    Mobility  Bed Mobility Overal bed mobility: Modified Independent                Transfers Overall transfer level: Modified independent                  Ambulation/Gait Ambulation/Gait assistance: Supervision;Modified independent (Device/Increase time) Ambulation Distance (Feet): 150 Feet Assistive device: None Gait Pattern/deviations: WFL(Within Functional Limits)     General Gait Details: improved gait this visit with no deficits observed   Stairs            Wheelchair Mobility    Modified Rankin (Stroke Patients Only)       Balance                                    Cognition Arousal/Alertness: Awake/alert Behavior During  Therapy: WFL for tasks assessed/performed Overall Cognitive Status: Within Functional Limits for tasks assessed                      Exercises Other Exercises Other Exercises: performed Laruth Bouchard Daroff exercises x1 to both sides however pt denies dizziness today Other Exercises: 2 gaze stabilization exercises performed x3 each with verbal cues Other Exercises: saccades exercises x3 with verbal cues for speed and progression Other Exercises: provided handouts for [employer] and gaze stability exercises    General Comments        Pertinent Vitals/Pain Pain Assessment: No/denies pain    Home Living                      Prior Function            PT Goals (current goals can now be found in the care plan section) Progress towards PT goals: Goals met/education completed, patient discharged from PT    Frequency       PT Plan  (d/c from acute PT)    Co-evaluation             End of Session   Activity Tolerance: Patient tolerated treatment well Patient left: in bed     Time: 3762-8315 PT Time Calculation (min): 13 min  Charges:  $  Therapeutic Exercise: 8-22 mins                    G Codes:  Functional Assessment Tool Used: clinical judgement Functional Limitation: Mobility: Walking and moving around Mobility: Walking and Moving Around Goal Status [phone]): 0 percent impaired, limited or restricted Mobility: Walking and Moving Around Discharge Status [phone]): 0 percent impaired, limited or restricted   Elier Zellars,KATHrine E 11/23/2013, 12:35 PM Carmelia Bake, PT, DPT 11/23/2013 Pager: [phone]

## 2013-11-24 DIAGNOSIS — F10239 Alcohol dependence with withdrawal, unspecified: Secondary | ICD-10-CM

## 2013-11-24 DIAGNOSIS — F319 Bipolar disorder, unspecified: Secondary | ICD-10-CM

## 2013-11-24 DIAGNOSIS — F10939 Alcohol use, unspecified with withdrawal, unspecified: Secondary | ICD-10-CM

## 2013-11-24 LAB — VALPROIC ACID LEVEL: VALPROIC ACID LVL: 58.1 ug/mL (ref 50.0–100.0)

## 2013-11-24 MED ORDER — HYDROXYZINE HCL 25 MG PO TABS
25.0000 mg | ORAL_TABLET | Freq: Four times a day (QID) | ORAL | Status: DC | PRN
  Filled 2013-11-24: qty 30

## 2013-11-24 NOTE — Progress Notes (Signed)
[facility] PROGRESS NOTE  Arval Brandstetter PJK:[id number] DOB: January 04, 1971 DOA: 11/15/2013 PCP: No PCP Per Patient  Assessment/Plan: #1 elevated blood pressure Likely secondary to acute gout pain and possible NSAID use. Patient with no pre-existing diagnosis of hypertension. Blood pressure currently improved with last blood pressure 122/86. Patient blood pressure was stable prior to institution of NSAIDs. Patient currently on indomethacin. Patient was also in pain. Discontinued indomethacin and treated acute gouty flare with a short prednisone course. Follow blood pressure. Continue clonidine as needed for systolic blood pressure greater than 809 or diastolic blood pressure greater than 110.  #2 acute gouty attack Clinical improvement. Patient had complained of left foot swelling and pain in the left first MTP joint. Likely an acute gouty flare. Due to elevated blood pressures  Naprosyn and indomethacin were discontinued. Significant clinical improvement on steriods. Continue short course of prednisone. Post acute flare will likely need to be on colchicine and allopurinol. Outpatient follow up. Follow.  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  #4 dizziness/??Vertigo Clinical improvement. Patient was complaining of a spinning sensation. Orthostatics have been checked and patient is not orthostatic. Outpatient PT followup for vestibular evaluation. Outpatient followup.  #5 HIV Currently not on any medications. Will need outpatient followup.  #6 history of polysubstance abuse Per primary team.   Nothing further to add at this time. Will sign off. Call with questions.    Code Status: Full Family Communication: Updated patient no family present. Disposition Plan: Per primary team   Consultants:  [facility]  Procedures:  None  Antibiotics:  None  HPI/Subjective: Patient states left foot pain improved. Patient also states some  improvement with dizziness. Patient however ambulating well.  Objective: Filed Vitals:   11/24/13 0628  BP: 122/86  Pulse: 107  Temp:   Resp:    No intake or output data in the 24 hours ending 11/24/13 1350 Filed Weights   11/15/13 0530  Weight: 87.544 kg (193 lb)    Exam:   General:  NAD  Cardiovascular: RRR  Respiratory: CTAB  Abdomen: Soft, nontender, nondistended, positive bowel sounds.  Musculoskeletal: No clubbing cyanosis. Decreased swelling around the left first MTP joint of the left foot which is non tender to palpation. No warmth.  Data Reviewed: Basic Metabolic Panel: No results found for this basename: NA, K, CL, CO2, GLUCOSE, BUN, CREATININE, CALCIUM, MG, PHOS,  in the last 168 hours Liver Function Tests: No results found for this basename: AST, ALT, ALKPHOS, BILITOT, PROT, ALBUMIN,  in the last 168 hours No results found for this basename: LIPASE, AMYLASE,  in the last 168 hours No results found for this basename: AMMONIA,  in the last 168 hours CBC: No results found for this basename: WBC, NEUTROABS, HGB, HCT, MCV, PLT,  in the last 168 hours Cardiac Enzymes: No results found for this basename: CKTOTAL, CKMB, CKMBINDEX, TROPONINI,  in the last 168 hours BNP (last 3 results)  Recent Labs  05/03/13 0149  PROBNP 60.7   CBG: No results found for this basename: GLUCAP,  in the last 168 hours  No results found for this or any previous visit (from the past 240 hour(s)).   Studies: No results found.  Scheduled Meds: . benztropine  0.5 mg Oral BID  . divalproex  500 mg Oral Q12H  . feeding supplement (ENSURE COMPLETE)  237 mL Oral BID BM  . gabapentin  400 mg Oral BID  . mirtazapine  7.5 mg Oral QHS  . multivitamin  with minerals  1 tablet Oral Daily  . nicotine  14 mg Transdermal Daily  . OLANZapine zydis  10 mg Oral Daily  . OLANZapine zydis  15 mg Oral QHS  . predniSONE  40 mg Oral QAC breakfast  . QUEtiapine  100 mg Oral QHS  . thiamine  100  mg Oral Daily  . traZODone  50 mg Oral QHS   Continuous Infusions:   Principal Problem:   Severe major depression with psychotic features Active Problems:   Alcohol dependence   Depression   Elevated BP   Gouty arthritis   Dizziness    Time spent: 35 minutes    [facility] MD [facility] Pager [phone]. If 7PM-7AM, please contact night-coverage at [email or web address], password [facility] 11/24/2013, 1:50 PM  LOS: 9 days

## 2013-11-24 NOTE — Progress Notes (Signed)
Patient ID: John [name], male   DOB: [birth year], 43 y.o.   MRN: [id number] D.[place] presents with irritable, depressed mood, again today. Pt  Irritable this morning, walking to window, then after having to wait for medications left stating '' stupid nurse wasn't ready to give my meds! '' Patient continues to report auditory hallucinations and passive SI thoughts. He is able to contract for safety. A.Medications given as ordered. Support and encouragement provided. [name] continues to be isolative to his room throughout shift thus far, in bed. He has refused to complete self inventory. Discussed above information with Dr. Olena Heckle. . R. Patient is safe. Pt is in no acute distress at this time, with no further voiced concerns. Will continue to monitor q 15 minutes for safety.

## 2013-11-24 NOTE — Progress Notes (Signed)
D. Pt was up and attended and participated in evening wrap up group. Pt did endorse passive SI this evening but is able to contract for safety on the unit. Pt still endorsing depression as well as auditory hallucinations and spoke about how he has difficult sleeping and spoke about how his medications are being adjusted to help him. A. Support and encouragement provided, medication education given. R. Pt verbalized understanding, safety maintained.

## 2013-11-24 NOTE — Progress Notes (Signed)
Spoke with pt 1:1 who was sitting in dayroom watching TV. Pt continues to endorse command AH to harm others and then self. Also states he is hearing music and singing coming through the vents in his room. Continues to report VH seeing "little pink and purple people walking around my room." He verbally contracts for safety. Affect is blunted with labile mood. Pt gives brief eye contact. Medicated per orders and support offered. Medications reviewed and education provided. Pt remains moderate fall risk so reminded of fall precautions. He denies pain or physical problems at this time and remains safe. Jamie Kato

## 2013-11-24 NOTE — [facility] Group Notes (Signed)
[facility] Group Notes:  (Nursing/MHT/Case Management/Adjunct)  Date:  11/24/2013  Time:  10:12 AM  Type of Therapy:  Psychoeducational Skills  Participation Level:  Did Not Attend  Participation Quality:  na  Affect:  na  Cognitive:  na  Insight:  None  Engagement in Group:  na  Modes of Intervention:  na  Summary of Progress/Problems:  [place] Reader 11/24/2013, 10:12 AM

## 2013-11-24 NOTE — Progress Notes (Signed)
Report received from B. McNichols RN. Patient currently lying in bed asleep, no distress noted. Safety maintained on unit with 15 min checks.

## 2013-11-24 NOTE — [facility] Group Notes (Signed)
[facility] Group Notes:  (Nursing/MHT/Case Management/Adjunct)  Date:  11/24/2013  Time:  10:23 AM  Type of Therapy:  self inventory review  Participation Level:  Did Not Attend  Participation Quality:  na  Affect:  na  Cognitive:  na  Insight:  None  Engagement in Group:  na  Modes of Intervention:  na  Summary of Progress/Problems:  Le[place] Reader 11/24/2013, 10:23 AM

## 2013-11-24 NOTE — Plan of Care (Signed)
Problem: Diagnosis: Increased Risk For Suicide Attempt Goal: LTG-Patient Will Report Improved Mood and Deny Suicidal LTG (by discharge) Patient will report improved mood and deny suicidal ideation.  Outcome: Not Progressing Patient continues to endorse command AH to harm others and then self. Passive SI but is able to contract for safety while on unit. Goal: LTG-Patient Will Report Absence of Withdrawal Symptoms LTG (by discharge): Patient will report absence of withdrawal symptoms.  Outcome: Completed/Met Date Met:  11/24/13 Patient has completed detox protocol and denies symptoms of withdrawal.

## 2013-11-24 NOTE — Progress Notes (Signed)
Patient ID: John [name], male   DOB: [birth year], 43 y.o.   MRN: [id number] [facility] MD Progress Note  11/24/2013 11:27 AM John [name]  MRN:  [id number] Subjective: " I am better"  Objective: Patient was seen and his chart reviewed. Patient remains cooperative. Patient continues to report AH but denies suicidal plan. Says it comes and go. Yesterday he was having plan to jump of window.  Patient reports VH of pink dolphins in the hallway (unsure if these are true). Patient contracts for safety. He is off his 1:1.He is able to contract for safety. He is attending some groups. Slow improvement over the last few days.   Diagnosis:    DSM5:  Primary psychiatric diagnosis:  Unspecified Bipolar and other related disorder  Secondary psychiatric diagnosis:  Alcohol use disorder  Alcohol withdrawal on (Librium protocol) Cannabis use disorder  Stimulant (cocaine) use disoder  Tobacco use disorder   Non psychiatric diagnosis:  Asthma  HIV (Dxed in 2014)  Gout  Chronic low back pain     ADL's:  Impaired  Sleep: Fair  Appetite:  Fair  Suicidal Ideation:  Reports SI with plan to jump out of his window.  ,but contracts for safety. Homicidal Ideation:  Denies   AEB (as evidenced by):  Psychiatric Specialty Exam: Physical Exam  Constitutional: He is oriented to person, place, and time. He appears well-developed and well-nourished.  HENT:  Head: Normocephalic and atraumatic.  Neurological: He is alert and oriented to person, place, and time.  Psychiatric: His speech is normal. His mood appears anxious (improving). His affect is labile. He is aggressive (at times) and actively hallucinating (improving). Thought content is paranoid. Cognition and memory are normal. He expresses impulsivity. He exhibits a depressed mood. He expresses suicidal ideation. He expresses suicidal plans.    Review of Systems  Constitutional: Negative.   HENT: Negative.   Eyes: Negative.   Cardiovascular:  Negative.   Gastrointestinal: Negative.   Musculoskeletal: Positive for joint pain and myalgias.  Neurological: Negative for dizziness and tremors.  Psychiatric/Behavioral: Positive for depression, suicidal ideas, hallucinations and substance abuse. The patient is nervous/anxious.     Blood pressure 122/86, pulse 107, temperature 98 F (36.7 C), temperature source Oral, resp. rate 20, height 5\' 8"  (1.727 m), weight 193 lb (87.544 kg), SpO2 99.00%.Body mass index is 29.35 kg/(m^2).  General Appearance: Disheveled  Eye Sport and exercise psychologist::  Fair  Speech:  Normal Rate  Volume:  Normal  Mood:  Anxious and Irritable  Affect:  Labile  Thought Process:  Linear  Orientation:  Full (Time, Place, and Person)  Thought Content:  Hallucinations: Auditory reduced  Suicidal Thoughts:  Yes.  without intent/plan contracts for safety  Homicidal Thoughts:  No has been trying not hurt anyone  Memory:  Immediate;   Fair Recent;   Fair Remote;   Fair  Judgement:  Impaired  Insight:  Lacking  Psychomotor Activity:  Normal  Concentration:  Fair  Recall:  [place]: Fair  Akathisia:  No    AIMS (if indicated):   0  Assets:  Others:  access to health care  Sleep:  Number of Hours: 6.75   Musculoskeletal: Strength & Muscle Tone: within normal limits Gait & Station: normal Patient leans: N/A  Current Medications: Current Facility-Administered Medications  Medication Dose Route Frequency Provider Last Rate Last Dose  . acetaminophen (TYLENOL) tablet 650 mg  650 mg Oral Q6H PRN Laverle Hobby, PA-C      . albuterol (PROVENTIL  HFA;VENTOLIN HFA) 108 (90 BASE) MCG/ACT inhaler 2 puff  2 puff Inhalation Q4H PRN Ursula Alert, MD   2 puff at 11/24/13 0621  . alum & mag hydroxide-simeth (MAALOX/MYLANTA) 200-200-20 MG/5ML suspension 30 mL  30 mL Oral Q4H PRN Laverle Hobby, PA-C      . benzocaine (ORAJEL) 10 % mucosal gel   Mouth/Throat QID PRN Nicholaus Bloom, MD      . benztropine  (COGENTIN) tablet 0.5 mg  0.5 mg Oral BID Ursula Alert, MD   0.5 mg at 11/24/13 0815  . cloNIDine (CATAPRES) tablet 0.1 mg  0.1 mg Oral Q6H PRN Eugenie Filler, MD      . divalproex (DEPAKOTE) DR tablet 500 mg  500 mg Oral Q12H Ursula Alert, MD   500 mg at 11/24/13 0816  . feeding supplement (ENSURE COMPLETE) (ENSURE COMPLETE) liquid 237 mL  237 mL Oral BID BM Elyse A Shearer, RD   237 mL at 11/23/13 1400  . gabapentin (NEURONTIN) capsule 400 mg  400 mg Oral BID Ursula Alert, MD   400 mg at 11/24/13 0816  . magnesium hydroxide (MILK OF MAGNESIA) suspension 30 mL  30 mL Oral Daily PRN Laverle Hobby, PA-C      . meclizine (ANTIVERT) tablet 25 mg  25 mg Oral TID Eugenie Filler, MD   25 mg at 11/24/13 0816  . mirtazapine (REMERON) tablet 7.5 mg  7.5 mg Oral QHS Saramma Eappen, MD   7.5 mg at 11/23/13 2127  . multivitamin with minerals tablet 1 tablet  1 tablet Oral Daily Ursula Alert, MD   1 tablet at 11/24/13 0815  . nicotine (NICODERM CQ - dosed in mg/24 hours) patch 14 mg  14 mg Transdermal Daily Laverle Hobby, PA-C   14 mg at 11/24/13 0817  . OLANZapine zydis (ZYPREXA) disintegrating tablet 10 mg  10 mg Oral Daily Ursula Alert, MD   10 mg at 11/24/13 0815  . OLANZapine zydis (ZYPREXA) disintegrating tablet 15 mg  15 mg Oral QHS Ursula Alert, MD   15 mg at 11/23/13 2126  . ondansetron (ZOFRAN-ODT) disintegrating tablet 4 mg  4 mg Oral Q8H PRN Ursula Alert, MD   4 mg at 11/22/13 1330  . predniSONE (DELTASONE) tablet 40 mg  40 mg Oral QAC breakfast Eugenie Filler, MD   40 mg at 11/24/13 9767  . QUEtiapine (SEROQUEL XR) 24 hr tablet 100 mg  100 mg Oral QHS Ursula Alert, MD   100 mg at 11/23/13 2127  . thiamine (VITAMIN B-1) tablet 100 mg  100 mg Oral Daily Ursula Alert, MD   100 mg at 11/24/13 0815  . traZODone (DESYREL) tablet 50 mg  50 mg Oral QHS Ursula Alert, MD   50 mg at 11/23/13 2129    Lab Results:  Results for orders placed during the hospital encounter of  11/15/13 (from the past 48 hour(s))  VALPROIC ACID LEVEL     Status: None   Collection Time    11/24/13  6:30 AM      Result Value Ref Range   Valproic Acid Lvl 58.1  50.0 - 100.0 ug/mL   Comment: Performed at [facility]    Physical Findings: AIMS: Facial and Oral Movements Muscles of Facial Expression: None, normal Lips and Perioral Area: None, normal Jaw: None, normal Tongue: None, normal,Extremity Movements Upper (arms, wrists, hands, fingers): None, normal Lower (legs, knees, ankles, toes): None, normal, Trunk Movements Neck, shoulders, hips: None, normal, Overall Severity Severity  of abnormal movements (highest score from questions above): None, normal Incapacitation due to abnormal movements: None, normal Patient's awareness of abnormal movements (rate only patient's report): No Awareness, Dental Status Current problems with teeth and/or dentures?: No Does patient usually wear dentures?: No  CIWA:  CIWA-Ar Total: 0 COWS:     Treatment Plan Summary: Daily contact with patient to assess and evaluate symptoms and progress in treatment Medication management  Plan: Will continue  patient on CIWA as well as Librium protocol for alcohol withdrawal symptoms.Patient has significant substance use problem- Will refer to substance abuse program after stabilization.   Ref. Range  05/07/2013 23:18  05/23/2013 12:24  07/24/2013 01:56  10/12/2013 04:40  11/14/2013 17:26   Alcohol, Ethyl (B)  Latest Range: 0-11 mg/dL  133 (H)  <11  212 (H)  195 (H)  246 (H)    Remeron to 7.5 mg po qhs. Depakote 500 mg po bid for mood lability ,irritability was started on 11/20/13. Has been cross titrated from seroquel to zyprexa.  Will continue trazodone 50 mg po qhs for sleep. Remeron will also help with sleep.  Will continue gabapentin 400 mg po bid. Will make prn's available for anxiety sx.   FOR HIV -patient used to be a part of START study per ehr. Per IM consult -patient to be referred for  outpatient follow up for his HIV medications. Patients medical problems being managed by IM -please see IM consult note. Will provide nicotine patch for smoking cessation.   Medical Decision Making Problem Points:  Established problem, worsening (2) and New problem, with no additional work-up planned (3) Data Points:  Order Aims Assessment (2) Review of medication regiment & side effects (2) Review of new medications or change in dosage (2)  I certify that inpatient services furnished can reasonably be expected to improve the patient's condition.   Merian Capron, MD 11/24/2013, 11:27 AM

## 2013-11-24 NOTE — BHH Group Notes (Signed)
[place] Group Notes:  (Clinical Social Work)  11/24/2013  11:15-12:00PM  Summary of Progress/Problems:   The main focus of today's process group was to discuss patients' feelings about hospitalization, the stigma attached to mental health, and sources of motivation to stay well.  We then worked to identify a specific plan to avoid future hospitalizations when discharged from the hospital for this admission.  The patient expressed that he is glad he is in the hospital, as he feels that we are keeping him safe from trouble he would definitely be getting into if at home.     Type of Therapy:  Group Therapy - Process  Participation Level:  Minimal  Participation Quality:  Drowsy  Affect:  Blunted  Cognitive:  Appropriate  Insight:  Developing/Improving  Engagement in Therapy:  Developing/Improving  Modes of Intervention:  Exploration, Discussion  Selmer Dominion, LCSW 11/24/2013, 1:16 PM

## 2013-11-25 NOTE — Progress Notes (Signed)
Pt resting in bed with eyes closed. No distress noted. Will continue to monitor closely and evaluate for stabilization.  

## 2013-11-25 NOTE — Progress Notes (Signed)
Patient ID: John [name], male   DOB: [birth year], 43 y.o.   MRN: [id number] [facility] MD Progress Note  11/25/2013 11:56 AM [name]  MRN:  [id number] Subjective: " I am better"  Objective: Patient was seen and his chart reviewed. Patient remains cooperative. Patient continues to report AH but denies suicidal plan. Says it comes and go. Denies today of jumping off a bridge or seeing colors. Remains vague about aud. Hallucinations. Guarded. Says did not sleep last night much that's why he is in bed and tired.    Diagnosis:    DSM5:  Primary psychiatric diagnosis:  Unspecified Bipolar and other related disorder  Secondary psychiatric diagnosis:  Alcohol use disorder  Alcohol withdrawal on (Librium protocol) Cannabis use disorder  Stimulant (cocaine) use disoder  Tobacco use disorder   Non psychiatric diagnosis:  Asthma  HIV (Dxed in 2014)  Gout  Chronic low back pain     ADL's:  Impaired  Sleep: Fair  Appetite:  Fair  Suicidal Ideation:  Reports SI with plan to jump out of his window.  ,but contracts for safety. Homicidal Ideation:  Denies   AEB (as evidenced by):  Psychiatric Specialty Exam: Physical Exam  Constitutional: He is oriented to person, place, and time. He appears well-developed and well-nourished.  HENT:  Head: Normocephalic and atraumatic.  Neurological: He is alert and oriented to person, place, and time.  Psychiatric: His speech is normal. His mood appears anxious (improving). His affect is labile. He is aggressive (at times) and actively hallucinating (improving). Thought content is paranoid. Cognition and memory are normal. He expresses impulsivity. He exhibits a depressed mood. He expresses suicidal ideation. He expresses suicidal plans.    Review of Systems  Eyes: Negative.   Cardiovascular: Negative.   Gastrointestinal: Negative.  Negative for nausea and diarrhea.  Musculoskeletal: Positive for joint pain and myalgias.  Neurological:  Negative for dizziness and tremors.  Psychiatric/Behavioral: Positive for depression, hallucinations and substance abuse. Negative for suicidal ideas. The patient is nervous/anxious and has insomnia.     Blood pressure 145/72, pulse 96, temperature 98.1 F (36.7 C), temperature source Oral, resp. rate 16, height 5\' 8"  (1.727 m), weight 87.544 kg (193 lb), SpO2 99.00%.Body mass index is 29.35 kg/(m^2).  General Appearance: Disheveled  Eye Sport and exercise psychologist::  Fair  Speech:  Normal Rate  Volume:  Normal  Mood:  Anxious and Irritable  Affect:  Labile  Thought Process:  Linear  Orientation:  Full (Time, Place, and Person)  Thought Content:  Hallucinations: Auditory reduced  Suicidal Thoughts:  Yes.  without intent/plan contracts for safety  Homicidal Thoughts:  No has been trying not hurt anyone  Memory:  Immediate;   Fair Recent;   Fair Remote;   Fair  Judgement:  Impaired  Insight:  Lacking  Psychomotor Activity:  Normal  Concentration:  Fair  Recall:  [place]: Fair  Akathisia:  No    AIMS (if indicated):   0  Assets:  Others:  access to health care  Sleep:  Number of Hours: 5.75   Musculoskeletal: Strength & Muscle Tone: within normal limits Gait & Station: normal Patient leans: N/A  Current Medications: Current Facility-Administered Medications  Medication Dose Route Frequency Provider Last Rate Last Dose  . acetaminophen (TYLENOL) tablet 650 mg  650 mg Oral Q6H PRN Laverle Hobby, PA-C      . albuterol (PROVENTIL HFA;VENTOLIN HFA) 108 (90 BASE) MCG/ACT inhaler 2 puff  2 puff Inhalation Q4H PRN Saramma Eappen,  MD   2 puff at 11/24/13 8115  . alum & mag hydroxide-simeth (MAALOX/MYLANTA) 200-200-20 MG/5ML suspension 30 mL  30 mL Oral Q4H PRN Laverle Hobby, PA-C      . benzocaine (ORAJEL) 10 % mucosal gel   Mouth/Throat QID PRN Nicholaus Bloom, MD      . benztropine (COGENTIN) tablet 0.5 mg  0.5 mg Oral BID Ursula Alert, MD   0.5 mg at 11/25/13 0739   . cloNIDine (CATAPRES) tablet 0.1 mg  0.1 mg Oral Q6H PRN Eugenie Filler, MD      . divalproex (DEPAKOTE) DR tablet 500 mg  500 mg Oral Q12H Ursula Alert, MD   500 mg at 11/25/13 0739  . feeding supplement (ENSURE COMPLETE) (ENSURE COMPLETE) liquid 237 mL  237 mL Oral BID BM Elyse A Shearer, RD   237 mL at 11/25/13 0938  . gabapentin (NEURONTIN) capsule 400 mg  400 mg Oral BID Ursula Alert, MD   400 mg at 11/25/13 0739  . hydrOXYzine (ATARAX/VISTARIL) tablet 25 mg  25 mg Oral Q6H PRN Merian Capron, MD      . magnesium hydroxide (MILK OF MAGNESIA) suspension 30 mL  30 mL Oral Daily PRN Laverle Hobby, PA-C      . mirtazapine (REMERON) tablet 7.5 mg  7.5 mg Oral QHS Saramma Eappen, MD   7.5 mg at 11/24/13 2117  . multivitamin with minerals tablet 1 tablet  1 tablet Oral Daily Ursula Alert, MD   1 tablet at 11/25/13 0739  . nicotine (NICODERM CQ - dosed in mg/24 hours) patch 14 mg  14 mg Transdermal Daily Laverle Hobby, PA-C   14 mg at 11/25/13 0801  . OLANZapine zydis (ZYPREXA) disintegrating tablet 10 mg  10 mg Oral Daily Ursula Alert, MD   10 mg at 11/25/13 0739  . OLANZapine zydis (ZYPREXA) disintegrating tablet 15 mg  15 mg Oral QHS Ursula Alert, MD   15 mg at 11/24/13 2139  . ondansetron (ZOFRAN-ODT) disintegrating tablet 4 mg  4 mg Oral Q8H PRN Ursula Alert, MD   4 mg at 11/22/13 1330  . predniSONE (DELTASONE) tablet 40 mg  40 mg Oral QAC breakfast Eugenie Filler, MD   40 mg at 11/25/13 0618  . QUEtiapine (SEROQUEL XR) 24 hr tablet 100 mg  100 mg Oral QHS Ursula Alert, MD   100 mg at 11/24/13 2117  . thiamine (VITAMIN B-1) tablet 100 mg  100 mg Oral Daily Ursula Alert, MD   100 mg at 11/25/13 0739  . traZODone (DESYREL) tablet 50 mg  50 mg Oral QHS Ursula Alert, MD   50 mg at 11/24/13 2117    Lab Results:  Results for orders placed during the hospital encounter of 11/15/13 (from the past 48 hour(s))  VALPROIC ACID LEVEL     Status: None   Collection Time     11/24/13  6:30 AM      Result Value Ref Range   Valproic Acid Lvl 58.1  50.0 - 100.0 ug/mL   Comment: Performed at [facility]    Physical Findings: AIMS: Facial and Oral Movements Muscles of Facial Expression: None, normal Lips and Perioral Area: None, normal Jaw: None, normal Tongue: None, normal,Extremity Movements Upper (arms, wrists, hands, fingers): None, normal Lower (legs, knees, ankles, toes): None, normal, Trunk Movements Neck, shoulders, hips: None, normal, Overall Severity Severity of abnormal movements (highest score from questions above): None, normal Incapacitation due to abnormal movements: None, normal Patient's awareness of  abnormal movements (rate only patient's report): No Awareness, Dental Status Current problems with teeth and/or dentures?: No Does patient usually wear dentures?: No  CIWA:  CIWA-Ar Total: 0 COWS:     Treatment Plan Summary: Daily contact with patient to assess and evaluate symptoms and progress in treatment Medication management  Plan: Will continue  patient on CIWA as well as Librium protocol for alcohol withdrawal symptoms.Patient has significant substance use problem- Will refer to substance abuse program after stabilization.   Ref. Range  05/07/2013 23:18  05/23/2013 12:24  07/24/2013 01:56  10/12/2013 04:40  11/14/2013 17:26   Alcohol, Ethyl (B)  Latest Range: 0-11 mg/dL  133 (H)  <11  212 (H)  195 (H)  246 (H)    Remeron to 7.5 mg po qhs. Depakote 500 mg po bid for mood lability ,irritability was started on 11/20/13. Has been cross titrated from seroquel to zyprexa.  Will continue trazodone 50 mg po qhs for sleep. Remeron will also help with sleep.  Will continue gabapentin 400 mg po bid. Will make prn's available for anxiety sx. Avoid taking naps during the day so he can sleep better at night. Encouraged to attend groups.   FOR HIV -patient used to be a part of START study per ehr. Per IM consult -patient to be referred for  outpatient follow up for his HIV medications. Patients medical problems being managed by IM -please see IM consult note. Will provide nicotine patch for smoking cessation.   Medical Decision Making Problem Points:  Established problem, worsening (2) and New problem, with no additional work-up planned (3) Data Points:  Order Aims Assessment (2) Review of medication regiment & side effects (2) Review of new medications or change in dosage (2)  I certify that inpatient services furnished can reasonably be expected to improve the patient's condition.   Merian Capron, MD 11/25/2013, 11:56 AM

## 2013-11-25 NOTE — BHH Group Notes (Signed)
[place] Group Notes:  Healthy coping skills  Date:  11/25/2013  Time:  10:43 AM  Type of Therapy:  Nurse Education  Participation Level:  Did Not Attend  Participation Quality:  Inattentive  Affect:  Blunted  Cognitive:  Lacking  Insight:  None  Engagement in Group:  Lacking  Modes of Intervention:  Discussion  Summary of Progress/Problems:  Delman Kitten 11/25/2013, 10:43 AM

## 2013-11-25 NOTE — Progress Notes (Signed)
Patient's status remains fairly unchanged. He continues to complain of AH with passive SI at times. Affect flat, mood depressed however he is not observed responding to internal stimuli. Complaints vague at times. Mood can be irritable but overall patient is cooperative. Medicated per orders. Offered support and encouragement. Patient has been a fall risk due to unsteady gait and periods of dizziness however those issues have resolved. Pt kept as moderate fall risk as a precaution and pt reminded of ways to prevent falls. He verbalized understanding. Denied VH/HI and verbally contracts for safety. John [name]

## 2013-11-25 NOTE — Progress Notes (Signed)
Patient ID: John [name], male   DOB: [birth year], 43 y.o.   MRN: [id number] John [name] presents with irritable, depressed mood, again today, affect congruent. Pt states '' I'm not feeling good, I am still hearing voices coming out of the air conditioner and also I didn't sleep at all last night. I think I need my medications increased at night '' Patient continues to report auditory hallucinations and passive SI thoughts. He is able to contract for safety again today. A. Medications given as ordered. Support and encouragement provided. John [name] continues to be isolative to his room throughout shift thus far, in bed. Discussed sleep hygiene. He has refused to complete self inventory. Discussed above information with Dr. Olena Heckle. . R. Patient is safe. Pt is in no acute distress at this time, with no further voiced concerns. Will continue to monitor q 15 minutes for safety.

## 2013-11-25 NOTE — BHH Group Notes (Signed)
[place] Group Notes:  (Clinical Social Work)  11/25/2013   11:15am-12:00pm  Summary of Progress/Problems:  The main focus of today's process group was to listen to a variety of genres of music and to identify that different types of music provoke different responses.  The patient then was able to identify personally what was soothing for them, as well as energizing.  Handouts were used to record feelings evoked, as well as how patient can personally use this knowledge in sleep habits, with depression, and with other symptoms.  The patient expressed understanding of concepts, as well as knowledge of how each type of music affected him and how this can be used at home as a wellness/recovery tool.  He kept his eyes closed almost all of group, but would respond with answers like "that made me feel like dancing."  Type of Therapy:  Music Therapy   Participation Level:  Active  Participation Quality:  Attentive and Sharing  Affect:  Blunted  Cognitive:  Oriented  Insight:  Engaged  Engagement in Therapy:  Engaged  Modes of Intervention:   Activity, Exploration  John Dominion, LCSW 11/25/2013, 12:30pm

## 2013-11-25 NOTE — Progress Notes (Signed)
[place] Group Notes:  (Nursing/MHT/Case Management/Adjunct)  Date:  11/25/2013  Time:  12:54 AM  Type of Therapy:  Psychoeducational Skills  Participation Level:  Minimal  Participation Quality:  Attentive  Affect:  Depressed  Cognitive:  Lacking  Insight:  None  Engagement in Group:  Improving  Modes of Intervention:  Education  Summary of Progress/Problems: Although the patient had very little to share in group last evening, he did at least make eye contact and uttered a sentence or two. He indicated that his day was "just a normal day". As a theme for the day, his coping skill will be to go walking.   John [name] S 11/25/2013, 12:54 AM

## 2013-11-25 NOTE — Progress Notes (Signed)
Adult Psychoeducational Group Note  Date:  11/25/2013 Time:  9:27 PM  Group Topic/Focus:  Wrap-Up Group:   The focus of this group is to help patients review their daily goal of treatment and discuss progress on daily workbooks.  Participation Level:  Active  Participation Quality:  Appropriate  Affect:  Appropriate  Cognitive:  Appropriate  Insight: Appropriate  Engagement in Group:  Engaged  Modes of Intervention:  Discussion  Additional Comments:  Pt attended wrap-up group this evening. Pt participate in group with peers.  Azaan Leask A 11/25/2013, 9:27 PM

## 2013-11-25 NOTE — BHH Group Notes (Signed)
[place] Group Notes:  Self inventory  Date:  11/25/2013  Time:  0930  Type of Therapy:  Nurse Education  Participation Level:  Did Not Attend  Participation Quality:  Inattentive  Affect:  Blunted  Cognitive:  Lacking  Insight:  None  Engagement in Group:  None  Modes of Intervention:  Discussion  Summary of Progress/Problems:  Delman Kitten 11/25/2013, 10:43 AM

## 2013-11-26 ENCOUNTER — Encounter (HOSPITAL_COMMUNITY): Payer: Self-pay | Admitting: Psychiatry

## 2013-11-26 DIAGNOSIS — F3162 Bipolar disorder, current episode mixed, moderate: Secondary | ICD-10-CM

## 2013-11-26 MED ORDER — MIRTAZAPINE 7.5 MG PO TABS: 7.5000 mg | ORAL_TABLET | Freq: Every day | ORAL | Status: DC

## 2013-11-26 MED ORDER — BENZTROPINE MESYLATE 0.5 MG PO TABS: 0.5000 mg | ORAL_TABLET | Freq: Two times a day (BID) | ORAL | Status: DC

## 2013-11-26 MED ORDER — BENZOCAINE 10 % MT GEL: Freq: Four times a day (QID) | OROMUCOSAL | Status: DC | PRN

## 2013-11-26 MED ORDER — HYDROXYZINE HCL 25 MG PO TABS: 25.0000 mg | ORAL_TABLET | Freq: Four times a day (QID) | ORAL | Status: DC | PRN

## 2013-11-26 MED ORDER — TRAZODONE HCL 100 MG PO TABS
100.0000 mg | ORAL_TABLET | Freq: Every day | ORAL | Status: DC
  Filled 2013-11-26: qty 14
  Filled 2013-11-26: qty 1

## 2013-11-26 MED ORDER — DIVALPROEX SODIUM 500 MG PO DR TAB: 500.0000 mg | DELAYED_RELEASE_TABLET | Freq: Two times a day (BID) | ORAL | Status: DC

## 2013-11-26 MED ORDER — OLANZAPINE 15 MG PO TBDP: 15.0000 mg | ORAL_TABLET | Freq: Every day | ORAL | Status: DC

## 2013-11-26 MED ORDER — GABAPENTIN 400 MG PO CAPS: 400.0000 mg | ORAL_CAPSULE | Freq: Two times a day (BID) | ORAL | Status: DC

## 2013-11-26 MED ORDER — TRAZODONE HCL 100 MG PO TABS: 100.0000 mg | ORAL_TABLET | Freq: Every day | ORAL | Status: DC

## 2013-11-26 MED ORDER — OLANZAPINE 10 MG PO TBDP: 10.0000 mg | ORAL_TABLET | Freq: Every morning | ORAL | Status: DC

## 2013-11-26 NOTE — Discharge Summary (Signed)
Physician Discharge Summary Note  Patient:  John [name] is an 42 y.o., male MRN:  [id number] DOB:  [birth year] Patient phone:  [phone] (home)  Patient address:   [address]  [place] [address],  Total Time spent with patient: 30 minutes  Date of Admission:  11/15/2013 Date of Discharge: 11/26/13  Reason for Admission:  Depression, Psychosis  Discharge Diagnoses: Principal Problem:   Severe major depression with psychotic features Active Problems:   Alcohol dependence   Depression   Elevated BP   Gouty arthritis   Dizziness   Psychiatric Specialty Exam: Physical Exam  Review of Systems  Constitutional: Negative.   HENT: Negative.   Eyes: Negative.   Respiratory: Negative.   Cardiovascular: Negative.   Gastrointestinal: Negative.   Genitourinary: Negative.   Musculoskeletal: Negative.   Skin: Negative.   Neurological: Negative.   Endo/Heme/Allergies: Negative.   Psychiatric/Behavioral: Positive for depression. The patient is nervous/anxious.     Blood pressure 140/83, pulse 114, temperature 97.6 F (36.4 C), temperature source Oral, resp. rate 16, height 5\' 8"  (1.727 m), weight 87.544 kg (193 lb), SpO2 99.00%.Body mass index is 29.35 kg/(m^2).  General Appearance: Casual   Eye Contact:: Fair   Speech: Clear and Coherent   Volume: Normal   Mood: Euthymic   Affect: Congruent   Thought Process: Coherent   Orientation: Full (Time, Place, and Person)   Thought Content: denies AH/VH/HI/SI   Suicidal Thoughts: No   Homicidal Thoughts: No   Memory: Immediate; Fair  Recent; Fair  Remote; Fair   Judgement: Fair   Insight: Fair   Psychomotor Activity: Normal   Concentration: Fair   Recall: [employer] of Knowledge:Fair   Language: Good   Akathisia: No     AIMS (if indicated): 0   Assets: Communication Skills   Sleep: Number of Hours: 6.5    Musculoskeletal:  Strength & Muscle Tone: within normal limits  Gait & Station: normal  Patient  leans: N/A         Diagnosis DSM5:   Primary psychiatric diagnosis:  Bipolar disorder type I ,with mixed features    Secondary psychiatric diagnosis:  Alcohol use disorder  Alcohol withdrawal (resolved).  Cannabis use disorder  Stimulant (cocaine) use disoder  Tobacco use disorder    Non psychiatric diagnosis:  Asthma  HIV (Dxed in 2014)  Gout  Chronic low back pain       Level of Care:  OP  Hospital Course:  John [name] is a 43 year old AAM who presented to [facility] with SI as well as depression. Patient on presentation reported SI with a plan to jump off a bridge. Patient also reported 2 previous suicide attempts in the past when he tried to cut self as well as tried to jump off of a bridge. Patient also reported AH x3 days asking him to "kill self", or "kill others". Patient admitted to drinking ETOH heavily as well as using marijuana and cocaine . Patient was recently admitted to [facility] from 09/20/2013 -09/28/2013 for similar presentation. Patient reports that he was not compliant on his medications after DC ,since he ran out and lost his prescriptions.  Patient on evaluation today also reported HI in general with no specific plan. Reports that likes the medications on which he was discharged and would like to be restarted on it.  During Hospitalization: Medications managed, psychoeducation, group and individual therapy. Pt currently denies SI, HI, and Psychosis. At discharge, pt rates anxiety and  depression as moderate. Pt states that he does have a good supportive home environment and will followup with outpatient treatment at [facility]. Affirms agreement with medication regimen and discharge plan. Patient had his medical problems taken care of while in the hospital. Patient to be referred to Infectious disease for his HIV medication management. Denies other physical and psychological concerns at time of discharge.     Consults:  psychiatry  Significant Diagnostic Studies:   N/A  Discharge Vitals:   Blood pressure 140/83, pulse 114, temperature 97.6 F (36.4 C), temperature source Oral, resp. rate 16, height 5\' 8"  (1.727 m), weight 87.544 kg (193 lb), SpO2 99.00%. Body mass index is 29.35 kg/(m^2). Lab Results:   Results for orders placed during the hospital encounter of 11/15/13 (from the past 72 hour(s))  VALPROIC ACID LEVEL     Status: None   Collection Time    11/24/13  6:30 AM      Result Value Ref Range   Valproic Acid Lvl 58.1  50.0 - 100.0 ug/mL   Comment: Performed at [facility]    Physical Findings: AIMS: Facial and Oral Movements Muscles of Facial Expression: None, normal Lips and Perioral Area: None, normal Jaw: None, normal Tongue: None, normal,Extremity Movements Upper (arms, wrists, hands, fingers): None, normal Lower (legs, knees, ankles, toes): None, normal, Trunk Movements Neck, shoulders, hips: None, normal, Overall Severity Severity of abnormal movements (highest score from questions above): None, normal Incapacitation due to abnormal movements: None, normal Patient's awareness of abnormal movements (rate only patient's report): No Awareness, Dental Status Current problems with teeth and/or dentures?: No Does patient usually wear dentures?: No  CIWA:  CIWA-Ar Total: 0 COWS:     Psychiatric Specialty Exam: See Psychiatric Specialty Exam and Suicide Risk Assessment completed by Attending Physician prior to discharge.  Discharge destination:  Home  Is patient on multiple antipsychotic therapies at discharge:  No   Has Patient had three or more failed trials of antipsychotic monotherapy by history:  No  Recommended Plan for Multiple Antipsychotic Therapies: NA     Medication List    STOP taking these medications       QUEtiapine 400 MG tablet  Commonly known as:  SEROQUEL      TAKE these medications     Indication   albuterol 108 (90 BASE) MCG/ACT inhaler  Commonly known as:  PROVENTIL HFA;VENTOLIN HFA   Inhale 2 puffs into the lungs every 4 (four) hours as needed for wheezing or shortness of breath.      benzocaine 10 % mucosal gel  Commonly known as:  ORAJEL  Use as directed in the mouth or throat 4 (four) times daily as needed for mouth pain.   Indication:  Mouth pain     benztropine 0.5 MG tablet  Commonly known as:  COGENTIN  Take 1 tablet (0.5 mg total) by mouth 2 (two) times daily.   Indication:  Extrapyramidal Reaction caused by Medications     divalproex 500 MG DR tablet  Commonly known as:  DEPAKOTE  Take 1 tablet (500 mg total) by mouth every 12 (twelve) hours.   Indication:  mood stabilization     gabapentin 400 MG capsule  Commonly known as:  NEURONTIN  Take 1 capsule (400 mg total) by mouth 2 (two) times daily.   Indication:  mood stabilization     hydrOXYzine 25 MG tablet  Commonly known as:  ATARAX/VISTARIL  Take 1 tablet (25 mg total) by mouth every 6 (six) hours as  needed for anxiety.   Indication:  anxiety     mirtazapine 7.5 MG tablet  Commonly known as:  REMERON  Take 1 tablet (7.5 mg total) by mouth at bedtime.   Indication:  Major Depressive Disorder     OLANZapine zydis 10 MG disintegrating tablet  Commonly known as:  ZYPREXA  Take 1 tablet (10 mg total) by mouth every morning.   Indication:  mood stabilization     olanzapine zydis 15 MG disintegrating tablet  Commonly known as:  ZYPREXA  Take 1 tablet (15 mg total) by mouth at bedtime.   Indication:  mood stabilization     traZODone 100 MG tablet  Commonly known as:  DESYREL  Take 1 tablet (100 mg total) by mouth at bedtime.   Indication:  Trouble Sleeping       Follow-up Information   Follow up with [facility]. (Walk in between 8am-9am Monday through Friday for hospital follow-up/medication management/assessment for therapy services. )    Contact information:   [address], [place] [address] Phone: [phone] Fax: [phone]      Follow up with [facility]  for Infectious Disease On 12/04/2013. (Appt at 9:30AM for assessment/bloodwork. Please call within 48 hours of appt if you must cancel or reschedule. Please bring any W-2 or tax return paperwork if possible as you will also be meeting with financial counselor during this visit.)    Contact information:   [place] [place], [place] [address] Phone: [phone] Fax: [phone]      Follow-up recommendations:   Activity: As tolerated  Diet: Regular  Tests: NA  Other: See below   Comments:    Take all medications as prescribed. Keep all follow-up appointments as scheduled.  Do not consume alcohol or use illegal drugs while on prescription medications. Report any adverse effects from your medications to your primary care provider promptly.  In the event of recurrent symptoms or worsening symptoms, call 911, a crisis hotline, or go to the nearest emergency department for evaluation.   Total Discharge Time:  Greater than 30 minutes.  Signed: Benjamine Mola FNP-BC 11/26/2013, 10:21 AM  Patient was seen face to face for psychiatric evaluation, suicide risk assessment and case discussed with treatment team and NP and made appropriate disposition plans. Reviewed the information documented and agree with the treatment plan.     Ursula Alert ,MD Attending [facility]

## 2013-11-26 NOTE — Plan of Care (Signed)
Problem: Ineffective individual coping Goal: LTG: Patient will report a decrease in negative feelings Outcome: Adequate for Discharge Patient states he is mentally stable for discharge.

## 2013-11-26 NOTE — Progress Notes (Signed)
[facility] Adult Case Management Discharge Plan :  Will you be returning to the same living situation after discharge: Yes,  home At discharge, do you have transportation home?:Yes,  bus pass Do you have the ability to pay for your medications:Yes,  mental health  Release of information consent forms completed and in the chart;  Patient's signature needed at discharge.  Patient to Follow up at: Follow-up Information   Follow up with [facility]. (Walk in between 8am-9am Monday through Friday for hospital follow-up/medication management/assessment for therapy services. )    Contact information:   [address], [place] [address] Phone: [phone] Fax: [phone]      Follow up with [facility] for Infectious Disease On 12/04/2013. (Appt at 9:30AM for assessment/bloodwork. Please call within 48 hours of appt if you must cancel or reschedule. Please bring any W-2 or tax return paperwork if possible as you will also be meeting with financial counselor during this visit.)    Contact information:   [place] [place], [place] [address] Phone: [phone] Fax: [phone]      Patient denies SI/HI:   Yes,  yers    Safety Planning and Suicide Prevention discussed:  Yes,  yes  Trish Mage 11/26/2013, 10:57 AM

## 2013-11-26 NOTE — [facility] Suicide Risk Assessment (Signed)
Demographic Factors:  Male and Unemployed  Total Time spent with patient: 20 minutes  Psychiatric Specialty Exam: Physical Exam  Constitutional: He is oriented to person, place, and time. He appears well-developed and well-nourished.  HENT:  Head: Normocephalic and atraumatic.  Eyes: Pupils are equal, round, and reactive to light.  Neck: Normal range of motion.  Cardiovascular: Normal rate.   Respiratory: Effort normal.  GI: Soft.  Musculoskeletal: Normal range of motion.  Neurological: He is alert and oriented to person, place, and time.  Skin: Skin is warm.  Psychiatric: He has a normal mood and affect. His speech is normal and behavior is normal. Judgment and thought content normal. Cognition and memory are normal.    Review of Systems  Constitutional: Negative.   HENT: Negative.   Eyes: Negative.   Respiratory: Negative.   Cardiovascular: Negative.   Gastrointestinal: Negative.   Genitourinary: Negative.   Musculoskeletal: Negative.   Skin: Negative.   Neurological: Negative.   Psychiatric/Behavioral: Negative for suicidal ideas and hallucinations.    Blood pressure 140/83, pulse 114, temperature 97.6 F (36.4 C), temperature source Oral, resp. rate 16, height 5\' 8"  (1.727 m), weight 87.544 kg (193 lb), SpO2 99.00%.Body mass index is 29.35 kg/(m^2).  General Appearance: Casual  Eye Contact::  Fair  Speech:  Clear and Coherent  Volume:  Normal  Mood:  Euthymic  Affect:  Congruent  Thought Process:  Coherent  Orientation:  Full (Time, Place, and Person)  Thought Content:  denies AH/VH/HI/SI  Suicidal Thoughts:  No  Homicidal Thoughts:  No  Memory:  Immediate;   Fair Recent;   Fair Remote;   Fair  Judgement:  Fair  Insight:  Fair  Psychomotor Activity:  Normal  Concentration:  Fair  Recall:  [employer] of Knowledge:Fair  Language: Good  Akathisia:  No    AIMS (if indicated):   0  Assets:  Communication Skills  Sleep:  Number of Hours: 6.5     Musculoskeletal: Strength & Muscle Tone: within normal limits Gait & Station: normal Patient leans: N/A   Mental Status Per Nursing Assessment::   On Admission:  Suicidal ideation indicated by patient;Self-harm thoughts  Current Mental Status by Physician: Denies SI/HI/AH/VH  Loss Factors: Decline in physical health  Historical Factors: Prior suicide attempts  Risk Reduction Factors:   Living with another person, especially a relative, Positive social support and Positive therapeutic relationship  Continued Clinical Symptoms:  Alcohol/Substance Abuse/Dependencies Previous Psychiatric Diagnoses and Treatments  Cognitive Features That Contribute To Risk:  Polarized thinking    Suicide Risk:  Acute risk for suicide is Minimal: No identifiable suicidal ideation.  Patients presenting with no risk factors but with morbid ruminations; may be classified as minimal risk based on the severity of the depressive symptoms However patient has a hx of substance use disorder,chronic psychiatric diagnosis,has low socio economic status as well as coexisting medical problems like HIV ,hence has mild to moderate chronic risk for suicide.  Discharge Diagnoses:    DSM5:  Primary psychiatric diagnosis:  Bipolar disorder type I ,with mixed features   Secondary psychiatric diagnosis:  Alcohol use disorder  Alcohol withdrawal  (resolved). Cannabis use disorder  Stimulant (cocaine) use disoder  Tobacco use disorder   Non psychiatric diagnosis:  Asthma  HIV (Dxed in 2014)  Gout  Chronic low back pain   Past Medical History  Diagnosis Date  . Asthma   . HIV (human immunodeficiency virus infection)     "dx'd ~ 2 yr ago" (  09/29/2012)  . Mental disorder   . Anxiety   . Depression   . Chronic low back pain   . Alcohol abuse   . HIV disease   . Hypertension   . Asthma    Plan Of Care/Follow-up recommendations:  Activity:  no restrictions  Is patient on multiple antipsychotic  therapies at discharge:  No   Has Patient had three or more failed trials of antipsychotic monotherapy by history:  No  Recommended Plan for Multiple Antipsychotic Therapies: NA    Gwyneth Fernandez 11/26/2013, 9:22 AM

## 2013-11-26 NOTE — Progress Notes (Signed)
Patient ID: John [name], male   DOB: Sep 23, 1970, 43 y.o.   MRN: [id number] Patient discharged home per MD order.  Patient denies SI/HI/AVH.  He denies any depressive symptoms.  Patient received all personal belongings, prescriptions and medication samples.  Medications reviewed with patient and he indicated understanding of same.  Patient left ambulatory with a bus pass.  He left for the bus station without incident.

## 2013-11-26 NOTE — Tx Team (Signed)
  Interdisciplinary Treatment Plan Update   Date Reviewed:  11/26/2013  Time Reviewed:  10:55 AM  Progress in Treatment:   Attending groups: Yes Participating in groups: Yes Taking medication as prescribed: Yes  Tolerating medication: Yes Family/Significant other contact made: Yes  Patient understands diagnosis: Yes  Discussing patient identified problems/goals with staff: Yes  See initial care plan Medical problems stabilized or resolved: Yes Denies suicidal/homicidal ideation: Yes  In tx team Patient has not harmed self or others: Yes  For review of initial/current patient goals, please see plan of care.  Estimated Length of Stay:  D/C today  Reason for Continuation of Hospitalization:   New Problems/Goals identified:  N/A  Discharge Plan or Barriers:   return home, follow up outpt  Additional Comments:  Attendees:  Signature: Steva Colder, MD 11/26/2013 10:55 AM   Signature: Ripley Fraise, LCSW 11/26/2013 10:55 AM  Signature: Elmarie Shiley, NP 11/26/2013 10:55 AM  Signature: Mayra Neer, RN 11/26/2013 10:55 AM  Signature: Darrol Angel, RN 11/26/2013 10:55 AM  Signature:  11/26/2013 10:55 AM  Signature:   11/26/2013 10:55 AM  Signature:    Signature:    Signature:    Signature:    Signature:    Signature:      Scribe for Treatment Team:   Ripley Fraise, LCSW  11/26/2013 10:55 AM

## 2013-11-29 NOTE — Progress Notes (Signed)
Patient Discharge Instructions:  After Visit Summary (AVS):   Faxed to:  11/29/13 Discharge Summary Note:   Faxed to:  11/29/13 Psychiatric Admission Assessment Note:   Faxed to:  11/29/13 Suicide Risk Assessment - Discharge Assessment:   Faxed to:  11/29/13 Faxed/Sent to the Next Level Care provider:  11/29/13 Next Level Care Provider Has Access to the EMR, 11/29/13 Faxed to [facility] @ [phone] Records provided to [facility] via [facility]/Epic access.  Patsey Berthold, 11/29/2013, 2:58 PM

## 2013-12-04 ENCOUNTER — Encounter: Payer: Self-pay | Admitting: Internal Medicine

## 2013-12-04 ENCOUNTER — Ambulatory Visit (INDEPENDENT_AMBULATORY_CARE_PROVIDER_SITE_OTHER): Payer: Self-pay | Admitting: Internal Medicine

## 2013-12-04 VITALS — BP 114/73 | HR 89 | Temp 98.2°F | Wt 205.0 lb

## 2013-12-04 DIAGNOSIS — F3162 Bipolar disorder, current episode mixed, moderate: Secondary | ICD-10-CM

## 2013-12-04 DIAGNOSIS — B20 Human immunodeficiency virus [HIV] disease: Secondary | ICD-10-CM

## 2013-12-04 DIAGNOSIS — F1020 Alcohol dependence, uncomplicated: Secondary | ICD-10-CM

## 2013-12-04 DIAGNOSIS — Z23 Encounter for immunization: Secondary | ICD-10-CM

## 2013-12-04 MED ORDER — ELVITEG-COBIC-EMTRICIT-TENOFDF 150-150-200-300 MG PO TABS: 1.0000 | ORAL_TABLET | Freq: Every day | ORAL | Status: DC

## 2013-12-04 MED ORDER — DARUNAVIR ETHANOLATE 800 MG PO TABS: 800.0000 mg | ORAL_TABLET | Freq: Every day | ORAL | Status: DC
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
## 2013-12-05 LAB — HIV-1 RNA ULTRAQUANT REFLEX TO GENTYP+
HIV 1 RNA Quant: 2495 copies/mL — ABNORMAL HIGH (ref <20)
HIV-1 RNA Quant, Log: 3.4 {Log} — ABNORMAL HIGH (ref <1.30)

## 2013-12-05 LAB — T-HELPER CELL (CD4) - (RCID CLINIC ONLY)
CD4 % Helper T Cell: 24 % — ABNORMAL LOW (ref 33–55)
CD4 T Cell Abs: 510 /uL (ref 400–2700)

## 2013-12-05 NOTE — Addendum Note (Signed)
Addended by: Janyce Llanos F on: 12/05/2013 12:10 PM   Modules accepted: Orders

## 2013-12-11 LAB — HLA B*5701: HLA-B 5701 W/RFLX HLA-B HIGH: NEGATIVE

## 2013-12-14 LAB — HIV-1 GENOTYPR PLUS

## 2013-12-19 ENCOUNTER — Encounter (HOSPITAL_COMMUNITY): Payer: Self-pay | Admitting: Emergency Medicine

## 2013-12-19 ENCOUNTER — Emergency Department (HOSPITAL_COMMUNITY)
Admission: EM | Admit: 2013-12-19 | Discharge: 2013-12-19 | Disposition: A | Discharge status: Home / Self Care | Payer: Self-pay | Attending: Emergency Medicine | Admitting: Emergency Medicine

## 2013-12-19 ENCOUNTER — Emergency Department (HOSPITAL_COMMUNITY): Payer: Self-pay

## 2013-12-19 DIAGNOSIS — Z21 Asymptomatic human immunodeficiency virus [HIV] infection status: Secondary | ICD-10-CM | POA: Insufficient documentation

## 2013-12-19 DIAGNOSIS — R0789 Other chest pain: Secondary | ICD-10-CM | POA: Insufficient documentation

## 2013-12-19 DIAGNOSIS — G8929 Other chronic pain: Secondary | ICD-10-CM | POA: Insufficient documentation

## 2013-12-19 DIAGNOSIS — J45901 Unspecified asthma with (acute) exacerbation: Secondary | ICD-10-CM | POA: Insufficient documentation

## 2013-12-19 DIAGNOSIS — F329 Major depressive disorder, single episode, unspecified: Secondary | ICD-10-CM | POA: Insufficient documentation

## 2013-12-19 DIAGNOSIS — R062 Wheezing: Secondary | ICD-10-CM

## 2013-12-19 DIAGNOSIS — Z79899 Other long term (current) drug therapy: Secondary | ICD-10-CM | POA: Insufficient documentation

## 2013-12-19 DIAGNOSIS — F419 Anxiety disorder, unspecified: Secondary | ICD-10-CM | POA: Insufficient documentation

## 2013-12-19 DIAGNOSIS — I10 Essential (primary) hypertension: Secondary | ICD-10-CM | POA: Insufficient documentation

## 2013-12-19 DIAGNOSIS — Z720 Tobacco use: Secondary | ICD-10-CM | POA: Insufficient documentation

## 2013-12-19 DIAGNOSIS — R0602 Shortness of breath: Secondary | ICD-10-CM

## 2013-12-19 MED ORDER — PREDNISONE 20 MG PO TABS: ORAL_TABLET | ORAL | Status: DC

## 2013-12-19 MED ORDER — AEROCHAMBER PLUS W/MASK MISC
1.0000 | Freq: Once | Status: DC
  Filled 2013-12-19: qty 1

## 2013-12-19 MED ORDER — ALBUTEROL SULFATE HFA 108 (90 BASE) MCG/ACT IN AERS
1.0000 | INHALATION_SPRAY | RESPIRATORY_TRACT | Status: DC | PRN
  Administered 2013-12-19: 2 via RESPIRATORY_TRACT
  Filled 2013-12-19: qty 6.7

## 2013-12-19 NOTE — ED Provider Notes (Signed)
Medical screening examination/treatment/procedure(s) were conducted as a shared visit with non-physician practitioner(s) or resident and myself. I personally evaluated the patient during the encounter and agree with the findings.  I have personally reviewed any xrays and/ or EKG's with the provider and I agree with interpretation.  Patient history of asthma and HIV, last CD4 counts okay reviewed. Clinically patient has expiratory wheezing bilateral, this feels similar to his asthma. Mild tachycardia on exam. Patient feels improved significantly after breathing treatments, steroids given. Outpatient followup discussed.  Acute asthma exacerbation, dyspnea/wheezing   Mariea Clonts, MD 12/19/13 1627

## 2013-12-19 NOTE — ED Notes (Signed)
30 minutes now; sob - hx. Of asthma. Ran out of inhaler yesterday. Initially: dec. Lg. Sds., exp. Wheezing; 125 mg Solumederol iv; total of 10 mg alubterol/0.5 mg atrovent. Alert and oriented.

## 2013-12-19 NOTE — ED Provider Notes (Signed)
CSN: [id number]     Arrival date & time 12/19/13  0944 History   First MD Initiated Contact with Patient 12/19/13 [id number]     Chief Complaint  Patient presents with  . Shortness of Breath  . Wheezing     (Consider location/radiation/quality/duration/timing/severity/associated sxs/prior Treatment) HPI Comments: Chilton Sallade is a 43 y.o. male with a PMHx of asthma, HIV, anxiety, depression, EtOH abuse, and HTN, who presents to the ED via EMS with complaints of worsening wheezing and SOB that began 1 week ago. States his asthma is exacerbated by weather changes, and over the last week he's been using his home inhaler with relief of symptoms, but he ran out of his meds yesterday. Since then he's had significant SOB and wheezing and called the ambulance today because he states "I couldn't get a good breath in". Reports this has happened before with weather changes and is similar to prior asthma exacerbations. Per pt, his symptoms have improved with 2 Duonebs given en route as well as solumedrol given en route by EMS. Associated symptoms include some chest tightness which is also improving with nebs, as well as a cough with whitish sputum production. Denies fevers, chills, hemoptysis, night sweats, LE swelling, jaw/neck/back pain, CP, orthopnea, PND, rhinorrhea, eye itching/discharge, ear pain, sore throat, abd pain, n/v/d/c, myalgias, arthralgias, or rashes. Nonsmoker, denies recent sick contacts. Denies recent immobility or travel, no hx of DVT. Follows up with [facility] clinic for his PCP Carlyle Basques listed as last visit in 11/2013). Has never seen a pulmonologist. Ran out of inhaler due to inability to see PCP recently, but denies issues with payment.  Patient is a 43 y.o. male presenting with shortness of breath and wheezing. The history is provided by the patient. No language interpreter was used.  Shortness of Breath Severity:  Severe Onset quality:  Gradual Duration:  1 week Timing:   Constant Progression:  Worsening Chronicity:  Recurrent Context: weather changes   Relieved by:  Inhaler Worsened by:  Weather changes Ineffective treatments:  None tried Associated symptoms: cough, sputum production (whitish) and wheezing   Associated symptoms: no abdominal pain, no chest pain, no claudication, no diaphoresis, no ear pain, no fever, no headaches, no hemoptysis, no neck pain, no PND, no rash, no sore throat, no syncope and no vomiting   Risk factors: no hx of PE/DVT, no prolonged immobilization, no recent surgery and no tobacco use   Wheezing Severity:  Moderate Severity compared to prior episodes:  Similar Onset quality:  Gradual Duration:  1 day Timing:  Constant Progression:  Unchanged Chronicity:  Recurrent Context comment:  Weather changes Relieved by:  Beta-agonist inhaler Ineffective treatments:  None tried Associated symptoms: chest tightness, cough, shortness of breath and sputum production (whitish)   Associated symptoms: no chest pain, no ear pain, no fever, no foot swelling, no headaches, no orthopnea, no PND, no rash, no rhinorrhea, no sore throat and no stridor     Past Medical History  Diagnosis Date  . Asthma   . HIV (human immunodeficiency virus infection)     "dx'd ~ 2 yr ago" (09/29/2012)  . Mental disorder   . Anxiety   . Depression   . Chronic low back pain   . Alcohol abuse   . HIV disease   . Hypertension   . Asthma    Past Surgical History  Procedure Laterality Date  . Skin graft full thickness leg Left ?    POSTERIOR LEFT LEG  AFTER DOG BITES  Family History  Problem Relation Age of Onset  . Alcoholism Mother   . Alcoholism Brother    History  Substance Use Topics  . Smoking status: Current Every Day Smoker -- 0.50 packs/day for 27 years    Types: Cigarettes  . Smokeless tobacco: Not on file  . Alcohol Use: 29.4 oz/week    49 Cans of beer per week     Comment: Daily use     Review of Systems  Constitutional:  Negative for fever, chills and diaphoresis.  HENT: Negative for congestion, ear pain, postnasal drip, rhinorrhea, sinus pressure, sneezing, sore throat and trouble swallowing.   Eyes: Negative for discharge and itching.  Respiratory: Positive for cough, sputum production (whitish), chest tightness, shortness of breath and wheezing. Negative for hemoptysis and stridor.   Cardiovascular: Negative for chest pain, orthopnea, claudication, leg swelling, syncope and PND.  Gastrointestinal: Negative for nausea, vomiting, abdominal pain, diarrhea and constipation.  Genitourinary: Negative for dysuria, urgency, frequency and hematuria.  Musculoskeletal: Negative for arthralgias, back pain, myalgias and neck pain.  Skin: Negative for rash.  Allergic/Immunologic: Positive for immunocompromised state (HIV+).  Neurological: Negative for dizziness, syncope, weakness, light-headedness, numbness and headaches.  Psychiatric/Behavioral: Negative for confusion.   10 Systems reviewed and are negative for acute change except as noted in the HPI.    Allergies  Shellfish allergy and Shellfish allergy  Home Medications   Prior to Admission medications   Medication Sig Start Date End Date Taking? Authorizing Provider  albuterol (PROVENTIL HFA;VENTOLIN HFA) 108 (90 BASE) MCG/ACT inhaler Inhale 2 puffs into the lungs every 4 (four) hours as needed for wheezing or shortness of breath.    Historical Provider, MD  benzocaine (ORAJEL) 10 % mucosal gel Use as directed in the mouth or throat 4 (four) times daily as needed for mouth pain. 11/26/13   Benjamine Mola, FNP  benztropine (COGENTIN) 0.5 MG tablet Take 1 tablet (0.5 mg total) by mouth 2 (two) times daily. 11/26/13   Benjamine Mola, FNP  Darunavir Ethanolate (PREZISTA) 800 MG tablet Take 1 tablet (800 mg total) by mouth daily with breakfast. 12/04/13   Carlyle Basques, MD  divalproex (DEPAKOTE) 500 MG DR tablet Take 1 tablet (500 mg total) by mouth every 12 (twelve)  hours. 11/26/13   Benjamine Mola, FNP  elvitegravir-cobicistat-emtricitabine-tenofovir (STRIBILD) 150-150-200-300 MG TABS tablet Take 1 tablet by mouth daily with breakfast. 12/04/13   Carlyle Basques, MD  gabapentin (NEURONTIN) 400 MG capsule Take 1 capsule (400 mg total) by mouth 2 (two) times daily. 11/26/13   Benjamine Mola, FNP  hydrOXYzine (ATARAX/VISTARIL) 25 MG tablet Take 1 tablet (25 mg total) by mouth every 6 (six) hours as needed for anxiety. 11/26/13   Benjamine Mola, FNP  mirtazapine (REMERON) 7.5 MG tablet Take 1 tablet (7.5 mg total) by mouth at bedtime. 11/26/13   Benjamine Mola, FNP  OLANZapine zydis (ZYPREXA) 10 MG disintegrating tablet Take 1 tablet (10 mg total) by mouth every morning. 11/26/13   Benjamine Mola, FNP  OLANZapine zydis (ZYPREXA) 15 MG disintegrating tablet Take 1 tablet (15 mg total) by mouth at bedtime. 11/26/13   Benjamine Mola, FNP  traZODone (DESYREL) 100 MG tablet Take 1 tablet (100 mg total) by mouth at bedtime. 11/26/13   Elyse Jarvis Withrow, FNP   BP 124/72  Pulse 100  Temp(Src) 98.3 F (36.8 C) (Oral)  Resp 16  SpO2 99% on 8L via NRB mask while getting nebs  Physical Exam  Nursing  note and vitals reviewed. Constitutional: He is oriented to person, place, and time. He appears well-developed and well-nourished.  Non-toxic appearance. He appears distressed (tremulous from nebs).  Receiving nebulizer tx, saturations 99% with 8L (on nebs, nonrebreather), mildly tremulous from nebulizers, but nontoxic appearing   HENT:  Head: Normocephalic and atraumatic.  Mouth/Throat: Oropharynx is clear and moist and mucous membranes are normal.  Eyes: Conjunctivae and EOM are normal. Right eye exhibits no discharge. Left eye exhibits no discharge.  Neck: Normal range of motion. Neck supple. No JVD present.  Cardiovascular: Normal rate, regular rhythm, normal heart sounds and intact distal pulses.  Exam reveals no gallop and no friction rub.   No murmur heard. Pulmonary/Chest:  Effort normal. No accessory muscle usage. Not tachypneic. No respiratory distress. He has no decreased breath sounds. He has wheezes (diffuse expiratory). He has no rhonchi. He has no rales.  Diffuse expiratory wheezing throughout all lung fields, no focal area of decreased sounds or rhonchi/rales. No resp distress although pt is receiving nebulizer at time of exam, saturations 99% on 8L via NRB mask (getting nebs), no increased WOB or retractions  Abdominal: Soft. Normal appearance and bowel sounds are normal. He exhibits no distension. There is no tenderness. There is no rigidity, no rebound and no guarding.  Musculoskeletal: Normal range of motion.  Neurological: He is alert and oriented to person, place, and time.  Skin: Skin is warm, dry and intact. No rash noted.  Psychiatric: He has a normal mood and affect.    ED Course  Procedures (including critical care time) Labs Review Labs Reviewed - No data to display 12/04/13 CD4 T cell Abs: 510   Imaging Review Dg Chest 2 View  12/19/2013   CLINICAL DATA:  Shortness of breath.  Asthma.  EXAM: CHEST  2 VIEW  COMPARISON:  07/25/1998 59  FINDINGS: Stable minimal scarring at the right lung apex. Heart size within normal limits. Large lung volumes. Airway thickening is present, suggesting bronchitis or reactive airways disease.  IMPRESSION: 1. Airway thickening is present, suggesting bronchitis or reactive airways disease. Large lung volumes likely reflect air trapping. A component of emphysema is not readily excluded.   Electronically Signed   By: Sherryl Barters M.D.   On: 12/19/2013 11:02     EKG Interpretation None    EKG 12/19/13: NSR  MDM   Final diagnoses:  Asthma exacerbation  SOB (shortness of breath)  Wheezing    43y/o male with asthma exacerbation, received 2 duonebs PTA along with solumedrol. Diffuse wheezing throughout. Given HIV hx, will obtain CXR to r/o PNA. Last CD4 count >200 last month, sees Dr. Carlyle [facility]ques at [facility]  clinic. Pt not tachycardic with no pleuritic CP, doubt PE. Will reassess shortly.  11:22 AM CXR showing airway thickening suggestive of RAD or bronchitis, hyperinflation. EKG NSR. Wheezing improved with nebs, still having some slight LLL wheezing but very trace. Pt comfortable on RA, not tachypneic. Will have him ambulate while checking pulse ox, if he maintains SpO2 >90% pt can be d/c'd with inhaler and prednisone x3 days.   12:08 PM Ambulated without difficulty, maintained oxygenation without difficulty. VSS, remains comfortable on RA. Will d/c home with inhaler and prednisone, f/up with PCP in 3-5 days and with pulm in 1-2 weeks for ongoing management. I explained the diagnosis and have given explicit precautions to return to the ER including for any other new or worsening symptoms. The patient understands and accepts the medical plan as it's been dictated and  I have answered their questions. Discharge instructions concerning home care and prescriptions have been given. The patient is STABLE and is discharged to home in good condition.  BP 118/68  Pulse 105  Temp(Src) 98.3 F (36.8 C) (Oral)  Resp 19  SpO2 100%  Meds ordered this encounter  Medications  . albuterol (PROVENTIL HFA;VENTOLIN HFA) 108 (90 BASE) MCG/ACT inhaler 1-2 puff    Sig:   . aerochamber plus with mask device 1 each    Sig:   . predniSONE (DELTASONE) 20 MG tablet    Sig: 3 tabs po daily x 3 days. TAKE WITH BREAKFAST!    Dispense:  9 tablet    Refill:  0    Order Specific Question:  Supervising Provider    Answer:  Johnna Acosta [address] [place]Soms, PA-C 12/19/13 1212

## 2013-12-19 NOTE — Progress Notes (Signed)
  CARE MANAGEMENT ED NOTE 12/19/2013  Patient:  John [name],John [name]   Account Number:  [id number]  Date Initiated:  12/19/2013  Documentation initiated by:  Edwyna Shell  Subjective/Objective Assessment:   42 yo male presenting to the ED with c/o SOB     Subjective/Objective Assessment Detail:     Action/Plan:   Patient will follow up at [facility] and [facility] for medication management and refills   Action/Plan Detail:   Anticipated DC Date:       Status Recommendation to Physician:   Result of Recommendation:  Agreed    DC Planning Services  CM consult  Other    Choice offered to / List presented to:            Status of service:  Completed, signed off  ED Comments:   ED Comments Detail:  This CM spoke with the patient and he did confirm that he is currently homeless. This CM discussed the [facility] services available and the patient stated that he is in their system and gets his medications through them. He stated that he did follow up at [facility] and received his mental health care and medication management through them. He stated he had a 10:30 financial counselor appointment this morning to apply for Medicaid. This CM then contacted the financial counseling office and notified them that the patient is currently in the ED. They stated that they will try to visit the patient in the ED. If unable to the patient stated to contact his aunt Ancil Boozer at [phone] and she knows how to contact the patient. This CM contact information was provided to the financial counseling office. The patient and ED staff nurse made aware. No other CM needs identified at this time.

## 2013-12-19 NOTE — Discharge Instructions (Signed)
Take Prednisone and use inhaler as directed. Continue your usual home medications. Get plenty of rest and drink plenty of fluids. Avoid any known triggers. Please followup with your primary doctor for further evaluation after today's visit; also follow up with pulmonologist for ongoing asthma management. Return to the ER for changes or worsening symptoms.   Asthma Asthma is a condition of the lungs in which the airways tighten and narrow. Asthma can make it hard to breathe. Asthma cannot be cured, but medicine and lifestyle changes can help control it. Asthma may be started (triggered) by:  Animal skin flakes (dander).  Dust.  Cockroaches.  Pollen.  Mold.  Smoke.  Cleaning products.  Hair sprays or aerosol sprays.  Paint fumes or strong smells.  Cold air, weather changes, and winds.  Crying or laughing hard.  Stress.  Certain medicines or drugs.  Foods, such as dried fruit, potato chips, and sparkling grape juice.  Infections or conditions (colds, flu).  Exercise.  Certain medical conditions or diseases.  Exercise or tiring activities. HOME CARE   Take medicine as told by your doctor.  Use a peak flow meter as told by your doctor. A peak flow meter is a tool that measures how well the lungs are working.  Record and keep track of the peak flow meter's readings.  Understand and use the asthma action plan. An asthma action plan is a written plan for taking care of your asthma and treating your attacks.  To help prevent asthma attacks:  Do not smoke. Stay away from secondhand smoke.  Change your heating and air conditioning filter often.  Limit your use of fireplaces and wood stoves.  Get rid of pests (such as roaches and mice) and their droppings.  Throw away plants if you see mold on them.  Clean your floors. Dust regularly. Use cleaning products that do not smell.  Have someone vacuum when you are not home. Use a vacuum cleaner with a HEPA filter if  possible.  Replace carpet with wood, tile, or vinyl flooring. Carpet can trap animal skin flakes and dust.  Use allergy-proof pillows, mattress covers, and box spring covers.  Wash bed sheets and blankets every week in hot water and dry them in a dryer.  Use blankets that are made of polyester or cotton.  Clean bathrooms and kitchens with bleach. If possible, have someone repaint the walls in these rooms with mold-resistant paint. Keep out of the rooms that are being cleaned and painted.  Wash hands often. GET HELP IF:  You have make a whistling sound when breaking (wheeze), have shortness of breath, or have a cough even if taking medicine to prevent attacks.  The colored mucus you cough up (sputum) is thicker than usual.  The colored mucus you cough up changes from clear or white to yellow, green, gray, or bloody.  You have problems from the medicine you are taking such as:  A rash.  Itching.  Swelling.  Trouble breathing.  You need reliever medicines more than 2-3 times a week.  Your peak flow measurement is still at 50-79% of your personal best after following the action plan for 1 hour.  You have a fever. GET HELP RIGHT AWAY IF:   You seem to be worse and are not responding to medicine during an asthma attack.  You are short of breath even at rest.  You get short of breath when doing very little activity.  You have trouble eating, drinking, or talking.  You have  chest pain.  You have a fast heartbeat.  Your lips or fingernails start to turn blue.  You are light-headed, dizzy, or faint.  Your peak flow is less than 50% of your personal best. MAKE SURE YOU:   Understand these instructions.  Will watch your condition.  Will get help right away if you are not doing well or get worse. Document Released: 08/18/2007 Document Revised: 07/16/2013 Document Reviewed: 09/28/2012 [facility] Patient Information 2015 [place], [place]. This information is not intended  to replace advice given to you by your health care provider. Make sure you discuss any questions you have with your health care provider.  Asthma Attack Prevention Although there is no way to prevent asthma from starting, you can take steps to control the disease and reduce its symptoms. Learn about your asthma and how to control it. Take an active role to control your asthma by working with your health care provider to create and follow an asthma action plan. An asthma action plan guides you in:  Taking your medicines properly.  Avoiding things that set off your asthma or make your asthma worse (asthma triggers).  Tracking your level of asthma control.  Responding to worsening asthma.  Seeking emergency care when needed. To track your asthma, keep records of your symptoms, check your peak flow number using a handheld device that shows how well air moves out of your lungs (peak flow meter), and get regular asthma checkups.  WHAT ARE SOME WAYS TO PREVENT AN ASTHMA ATTACK?  Take medicines as directed by your health care provider.  Keep track of your asthma symptoms and level of control.  With your health care provider, write a detailed plan for taking medicines and managing an asthma attack. Then be sure to follow your action plan. Asthma is an ongoing condition that needs regular monitoring and treatment.  Identify and avoid asthma triggers. Many outdoor allergens and irritants (such as pollen, mold, cold air, and air pollution) can trigger asthma attacks. Find out what your asthma triggers are and take steps to avoid them.  Monitor your breathing. Learn to recognize warning signs of an attack, such as coughing, wheezing, or shortness of breath. Your lung function may decrease before you notice any signs or symptoms, so regularly measure and record your peak airflow with a home peak flow meter.  Identify and treat attacks early. If you act quickly, you are less likely to have a severe  attack. You will also need less medicine to control your symptoms. When your peak flow measurements decrease and alert you to an upcoming attack, take your medicine as instructed and immediately stop any activity that may have triggered the attack. If your symptoms do not improve, get medical help.  Pay attention to increasing quick-relief inhaler use. If you find yourself relying on your quick-relief inhaler, your asthma is not under control. See your health care provider about adjusting your treatment. WHAT CAN MAKE MY SYMPTOMS WORSE? A number of common things can set off or make your asthma symptoms worse and cause temporary increased inflammation of your airways. Keep track of your asthma symptoms for several weeks, detailing all the environmental and emotional factors that are linked with your asthma. When you have an asthma attack, go back to your asthma diary to see which factor, or combination of factors, might have contributed to it. Once you know what these factors are, you can take steps to control many of them. If you have allergies and asthma, it is important to  take asthma prevention steps at home. Minimizing contact with the substance to which you are allergic will help prevent an asthma attack. Some triggers and ways to avoid these triggers are: Animal Dander:  Some people are allergic to the flakes of skin or dried saliva from animals with fur or feathers.   There is no such thing as a hypoallergenic dog or cat breed. All dogs or cats can cause allergies, even if they don't shed.  Keep these pets out of your home.  If you are not able to keep a pet outdoors, keep the pet out of your bedroom and other sleeping areas at all times, and keep the door closed.  Remove carpets and furniture covered with cloth from your home. If that is not possible, keep the pet away from fabric-covered furniture and carpets. Dust Mites: Many people with asthma are allergic to dust mites. Dust mites are  tiny bugs that are found in every home in mattresses, pillows, carpets, fabric-covered furniture, bedcovers, clothes, stuffed toys, and other fabric-covered items.   Cover your mattress in a special dust-proof cover.  Cover your pillow in a special dust-proof cover, or wash the pillow each week in hot water. Water must be hotter than 130 F (54.4 C) to kill dust mites. Cold or warm water used with detergent and bleach can also be effective.  Wash the sheets and blankets on your bed each week in hot water.  Try not to sleep or lie on cloth-covered cushions.  Call ahead when traveling and ask for a smoke-free hotel room. Bring your own bedding and pillows in case the hotel only supplies feather pillows and down comforters, which may contain dust mites and cause asthma symptoms.  Remove carpets from your bedroom and those laid on concrete, if you can.  Keep stuffed toys out of the bed, or wash the toys weekly in hot water or cooler water with detergent and bleach. Cockroaches: Many people with asthma are allergic to the droppings and remains of cockroaches.   Keep food and garbage in closed containers. Never leave food out.  Use poison baits, traps, powders, gels, or paste (for example, boric acid).  If a spray is used to kill cockroaches, stay out of the room until the odor goes away. Indoor Mold:  Fix leaky faucets, pipes, or other sources of water that have mold around them.  Clean floors and moldy surfaces with a fungicide or diluted bleach.  Avoid using humidifiers, vaporizers, or swamp coolers. These can spread molds through the air. Pollen and Outdoor Mold:  When pollen or mold spore counts are high, try to keep your windows closed.  Stay indoors with windows closed from late morning to afternoon. Pollen and some mold spore counts are highest at that time.  Ask your health care provider whether you need to take anti-inflammatory medicine or increase your dose of the medicine  before your allergy season starts. Other Irritants to Avoid:  Tobacco smoke is an irritant. If you smoke, ask your health care provider how you can quit. Ask family members to quit smoking, too. Do not allow smoking in your home or car.  If possible, do not use a wood-burning stove, kerosene heater, or fireplace. Minimize exposure to all sources of smoke, including incense, candles, fires, and fireworks.  Try to stay away from strong odors and sprays, such as perfume, talcum powder, hair spray, and paints.  Decrease humidity in your home and use an indoor air cleaning device. Reduce indoor humidity  to below 60%. Dehumidifiers or central air conditioners can do this.  Decrease house dust exposure by changing furnace and air cooler filters frequently.  Try to have someone else vacuum for you once or twice a week. Stay out of rooms while they are being vacuumed and for a short while afterward.  If you vacuum, use a dust mask from a hardware store, a double-layered or microfilter vacuum cleaner bag, or a vacuum cleaner with a HEPA filter.  Sulfites in foods and beverages can be irritants. Do not drink beer or wine or eat dried fruit, processed potatoes, or shrimp if they cause asthma symptoms.  Cold air can trigger an asthma attack. Cover your nose and mouth with a scarf on cold or windy days.  Several health conditions can make asthma more difficult to manage, including a runny nose, sinus infections, reflux disease, psychological stress, and sleep apnea. Work with your health care provider to manage these conditions.  Avoid close contact with people who have a respiratory infection such as a cold or the flu, since your asthma symptoms may get worse if you catch the infection. Wash your hands thoroughly after touching items that may have been handled by people with a respiratory infection.  Get a flu shot every year to protect against the flu virus, which often makes asthma worse for days or  weeks. Also get a pneumonia shot if you have not previously had one. Unlike the flu shot, the pneumonia shot does not need to be given yearly. Medicines:  Talk to your health care provider about whether it is safe for you to take aspirin or non-steroidal anti-inflammatory medicines (NSAIDs). In a small number of people with asthma, aspirin and NSAIDs can cause asthma attacks. These medicines must be avoided by people who have known aspirin-sensitive asthma. It is important that people with aspirin-sensitive asthma read labels of all over-the-counter medicines used to treat pain, colds, coughs, and fever.  Beta-blockers and ACE inhibitors are other medicines you should discuss with your health care provider. HOW CAN I FIND OUT WHAT I AM ALLERGIC TO? Ask your asthma health care provider about allergy skin testing or blood testing (the RAST test) to identify the allergens to which you are sensitive. If you are found to have allergies, the most important thing to do is to try to avoid exposure to any allergens that you are sensitive to as much as possible. Other treatments for allergies, such as medicines and allergy shots (immunotherapy) are available.  CAN I EXERCISE? Follow your health care provider's advice regarding asthma treatment before exercising. It is important to maintain a regular exercise program, but vigorous exercise or exercise in cold, humid, or dry environments can cause asthma attacks, especially for those people who have exercise-induced asthma. Document Released: 02/17/2009 Document Revised: 03/06/2013 Document Reviewed: 09/06/2012 [facility] Patient Information 2015 [place], [place]. This information is not intended to replace advice given to you by your health care provider. Make sure you discuss any questions you have with your health care provider.  Asthma Action Plan Patient Name: __________________________________________________Date:________ Follow-up appointment with health care  provider:   Health Care Provider Name: ____________________  Telephone: ____________________  Follow-up recommendation: ____________________ Always take all of your medicines to all of your appointments. POSSIBLE TRIGGERS  Animal dander from the skin, hair, or feathers of animals.  Dust mites contained in house dust.  Cockroaches.  Pollen from trees or grass.  Mold.  Cigarette or tobacco smoke.  Air pollutants such as dust, household cleaners,  hair sprays, aerosol sprays, paint fumes, strong chemicals, or strong odors.  Cold air or weather changes. Cold air may cause inflammation. Winds increase molds and pollens in the air.  Strong emotions such as crying or laughing hard.  Stress.  Certain medicines such as aspirin or beta-blockers.  Sulfites in such foods and drinks as dried fruits and wine.  Infections or inflammatory conditions such as a flu, cold, or inflammation of the nasal membranes (rhinitis).  Gastroesophageal reflux disease (GERD). GERD is a condition where stomach acid backs up into your throat (esophagus).  Exercise or strenuous activity. WHEN WELL: ASTHMA UNDER CONTROL Symptoms: No cough or wheezing, chest tightness, or shortness of breath either during the day or at night; can participate in usual activities. If using a peak flow meter: My optimal peak flow is: _____ to _____ (should be 80-100% of personal best) Medicines: Every day:  Controller: ________________ How much? ________________ When? ________________  Controller: ________________ How much? ________________ When? ________________ Before exercise:  Reliever: ________________ How much? ________________ When? ________________ If symptoms are noted:  Reliever/Rescue: ________________ How much? ________________ When? ________________ Call your health care provider if using a reliever more than 2-3 times per week. WHEN NOT WELL: ASTHMA GETTING WORSE Symptoms: Cough, wheeze, shortness of  breath, chest tightness, waking at night due to asthma, unable to participate in all of usual activities. If using a peak flow meter: My peak flow is: _____ to _____ (50-79% of personal best) Add the following medicine to those used daily:  Reliever/Rescue: ________________ How much? ________________ When? ________________ If symptoms and peak flow return to GREEN ZONE after 1 hour of above treatment, continue monitoring to make sure you remain in green zone. If symptoms and peak flow DO NOT return to GREEN ZONE after 1 hour of above treatment:  Reliever/Rescue: ________________ How much? ________________ When? ________________  Oral Steroids: ________________ How much? ________________ When? ________________  Call your health care provider if: ______________________________________________________________ IF SYMPTOMS GET WORSE: ASTHMA IS SEVERE - GET HELP NOW! Symptoms: Severely short of breath, rescue medicines have not helped, cannot participate in usual activities, you are having trouble walking or talking due to asthma symptoms, you are dizzy or faint, your fingernails or lips are bluish, your symptoms are the same or worse after 24 hours in Yellow Caution Zone. If using a peak flow meter: My peak flow is: less than _____ (50% of personal best) Add the following medicine to those used daily:  Reliever/Rescue: ________________ How much? ________________ When? ________________  Oral Steroids: ________________ How much? ________________ When? ________________  [facility] HEALTH CARE PROVIDER IMMEDIATELY. If you are in the red danger zone and cannot reach your health care provider immediately, call your local emergency services (911 in [place] without delay. If emergency services are far away and will take a long time to get to you, have someone drive you directly to a hospital emergency department or meet emergency services en route to you. Document Released: 12/27/2008 Document Revised:  07/16/2013 Document Reviewed: 01/10/2012 [facility] Patient Information 2015 [place], [place]. This information is not intended to replace advice given to you by your health care provider. Make sure you discuss any questions you have with your health care provider.  How to Use an Inhaler Using your inhaler correctly is very important. Good technique will make sure that the medicine reaches your lungs.  HOW TO USE AN INHALER: 1. Take the cap off the inhaler. 2. If this is the first time using your inhaler, you need to  prime it. Shake the inhaler for 5 seconds. Release four puffs into the air, away from your face. Ask your doctor for help if you have questions. 3. Shake the inhaler for 5 seconds. 4. Turn the inhaler so the bottle is above the mouthpiece. 5. Put your pointer finger on top of the bottle. Your thumb holds the bottom of the inhaler. 6. Open your mouth. 7. Either hold the inhaler away from your mouth (the width of 2 fingers) or place your lips tightly around the mouthpiece. Ask your doctor which way to use your inhaler. 8. Breathe out as much air as possible. 9. Breathe in and push down on the bottle 1 time to release the medicine. You will feel the medicine go in your mouth and throat. 10. Continue to take a deep breath in very slowly. Try to fill your lungs. 11. After you have breathed in completely, hold your breath for 10 seconds. This will help the medicine to settle in your lungs. If you cannot hold your breath for 10 seconds, hold it for as long as you can before you breathe out. 12. Breathe out slowly, through pursed lips. Whistling is an example of pursed lips. 13. If your doctor has told you to take more than 1 puff, wait at least 15-30 seconds between puffs. This will help you get the best results from your medicine. Do not use the inhaler more than your doctor tells you to. 14. Put the cap back on the inhaler. 15. Follow the directions from your doctor or from the inhaler  package about cleaning the inhaler. If you use more than one inhaler, ask your doctor which inhalers to use and what order to use them in. Ask your doctor to help you figure out when you will need to refill your inhaler.  If you use a steroid inhaler, always rinse your mouth with water after your last puff, gargle and spit out the water. Do not swallow the water. GET HELP IF:  The inhaler medicine only partially helps to stop wheezing or shortness of breath.  You are having trouble using your inhaler.  You have some increase in thick spit (phlegm). GET HELP RIGHT AWAY IF:  The inhaler medicine does not help your wheezing or shortness of breath or you have tightness in your chest.  You have dizziness, headaches, or fast heart rate.  You have chills, fever, or night sweats.  You have a large increase of thick spit, or your thick spit is bloody. MAKE SURE YOU:   Understand these instructions.  Will watch your condition.  Will get help right away if you are not doing well or get worse. Document Released: 12/09/2007 Document Revised: 12/20/2012 Document Reviewed: 09/28/2012 [facility] Patient Information 2015 [place], [place]. This information is not intended to replace advice given to you by your health care provider. Make sure you discuss any questions you have with your health care provider.

## 2013-12-24 ENCOUNTER — Ambulatory Visit (INDEPENDENT_AMBULATORY_CARE_PROVIDER_SITE_OTHER): Payer: Self-pay | Admitting: *Deleted

## 2013-12-24 VITALS — BP 120/82 | HR 73 | Temp 98.5°F | Resp 16 | Wt 202.0 lb

## 2013-12-24 DIAGNOSIS — Z006 Encounter for examination for normal comparison and control in clinical research program: Secondary | ICD-10-CM

## 2013-12-24 DIAGNOSIS — B20 Human immunodeficiency virus [HIV] disease: Secondary | ICD-10-CM

## 2013-12-24 LAB — CD4/CD8 (T-HELPER/T-SUPPRESSOR CELL)
CD4%: 22.8
CD4: 524
CD8 % Suppressor T Cell: 62.3
CD8: 1433
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
## 2013-12-25 LAB — HIV-1 RNA QUANT-NO REFLEX-BLD
HIV 1 RNA QUANT: 8058 {copies}/mL — AB (ref <20)
HIV-1 RNA QUANT, LOG: 3.91 {Log} — AB (ref <1.30)

## 2014-01-08 ENCOUNTER — Ambulatory Visit: Payer: Self-pay

## 2014-01-09 ENCOUNTER — Inpatient Hospital Stay (HOSPITAL_COMMUNITY)
Admission: AD | Admit: 2014-01-09 | Discharge: 2014-01-15 | DRG: 896 | Disposition: A | Discharge status: Home / Self Care | Payer: Federal, State, Local not specified - Other | Source: Intra-hospital | Attending: Psychiatry | Admitting: Psychiatry

## 2014-01-09 ENCOUNTER — Encounter (HOSPITAL_COMMUNITY): Payer: Self-pay | Admitting: Emergency Medicine

## 2014-01-09 ENCOUNTER — Encounter (HOSPITAL_COMMUNITY): Payer: Self-pay | Admitting: General Practice

## 2014-01-09 ENCOUNTER — Emergency Department (HOSPITAL_COMMUNITY)
Admission: EM | Admit: 2014-01-09 | Discharge: 2014-01-09 | Disposition: A | Discharge status: Other Acute Inpatient Hospital | Payer: Federal, State, Local not specified - Other | Attending: Emergency Medicine | Admitting: Emergency Medicine

## 2014-01-09 DIAGNOSIS — Z590 Homelessness: Secondary | ICD-10-CM | POA: Insufficient documentation

## 2014-01-09 DIAGNOSIS — F1020 Alcohol dependence, uncomplicated: Secondary | ICD-10-CM

## 2014-01-09 DIAGNOSIS — G4700 Insomnia, unspecified: Secondary | ICD-10-CM | POA: Diagnosis present

## 2014-01-09 DIAGNOSIS — F17210 Nicotine dependence, cigarettes, uncomplicated: Secondary | ICD-10-CM | POA: Diagnosis present

## 2014-01-09 DIAGNOSIS — K5900 Constipation, unspecified: Secondary | ICD-10-CM | POA: Diagnosis present

## 2014-01-09 DIAGNOSIS — F333 Major depressive disorder, recurrent, severe with psychotic symptoms: Secondary | ICD-10-CM | POA: Diagnosis present

## 2014-01-09 DIAGNOSIS — F10239 Alcohol dependence with withdrawal, unspecified: Secondary | ICD-10-CM | POA: Diagnosis present

## 2014-01-09 DIAGNOSIS — I10 Essential (primary) hypertension: Secondary | ICD-10-CM | POA: Diagnosis present

## 2014-01-09 DIAGNOSIS — J45909 Unspecified asthma, uncomplicated: Secondary | ICD-10-CM | POA: Diagnosis present

## 2014-01-09 DIAGNOSIS — Z7952 Long term (current) use of systemic steroids: Secondary | ICD-10-CM | POA: Insufficient documentation

## 2014-01-09 DIAGNOSIS — F10920 Alcohol use, unspecified with intoxication, uncomplicated: Secondary | ICD-10-CM

## 2014-01-09 DIAGNOSIS — F329 Major depressive disorder, single episode, unspecified: Secondary | ICD-10-CM | POA: Insufficient documentation

## 2014-01-09 DIAGNOSIS — F1094 Alcohol use, unspecified with alcohol-induced mood disorder: Secondary | ICD-10-CM

## 2014-01-09 DIAGNOSIS — M109 Gout, unspecified: Secondary | ICD-10-CM | POA: Diagnosis present

## 2014-01-09 DIAGNOSIS — Z21 Asymptomatic human immunodeficiency virus [HIV] infection status: Secondary | ICD-10-CM | POA: Insufficient documentation

## 2014-01-09 DIAGNOSIS — R45851 Suicidal ideations: Secondary | ICD-10-CM | POA: Diagnosis present

## 2014-01-09 DIAGNOSIS — F17200 Nicotine dependence, unspecified, uncomplicated: Secondary | ICD-10-CM

## 2014-01-09 DIAGNOSIS — F419 Anxiety disorder, unspecified: Secondary | ICD-10-CM | POA: Diagnosis present

## 2014-01-09 DIAGNOSIS — Z79899 Other long term (current) drug therapy: Secondary | ICD-10-CM | POA: Insufficient documentation

## 2014-01-09 DIAGNOSIS — R45850 Homicidal ideations: Secondary | ICD-10-CM

## 2014-01-09 DIAGNOSIS — B20 Human immunodeficiency virus [HIV] disease: Secondary | ICD-10-CM

## 2014-01-09 DIAGNOSIS — Z9119 Patient's noncompliance with other medical treatment and regimen: Secondary | ICD-10-CM | POA: Diagnosis present

## 2014-01-09 DIAGNOSIS — G8929 Other chronic pain: Secondary | ICD-10-CM | POA: Insufficient documentation

## 2014-01-09 DIAGNOSIS — F19929 Other psychoactive substance use, unspecified with intoxication, unspecified: Secondary | ICD-10-CM | POA: Insufficient documentation

## 2014-01-09 DIAGNOSIS — F1220 Cannabis dependence, uncomplicated: Secondary | ICD-10-CM

## 2014-01-09 DIAGNOSIS — F1210 Cannabis abuse, uncomplicated: Secondary | ICD-10-CM | POA: Insufficient documentation

## 2014-01-09 DIAGNOSIS — Z91199 Patient's noncompliance with other medical treatment and regimen due to unspecified reason: Secondary | ICD-10-CM

## 2014-01-09 DIAGNOSIS — F1994 Other psychoactive substance use, unspecified with psychoactive substance-induced mood disorder: Secondary | ICD-10-CM

## 2014-01-09 DIAGNOSIS — F1420 Cocaine dependence, uncomplicated: Secondary | ICD-10-CM

## 2014-01-09 DIAGNOSIS — Z720 Tobacco use: Secondary | ICD-10-CM | POA: Insufficient documentation

## 2014-01-09 DIAGNOSIS — F32A Depression, unspecified: Secondary | ICD-10-CM

## 2014-01-09 DIAGNOSIS — F1014 Alcohol abuse with alcohol-induced mood disorder: Secondary | ICD-10-CM | POA: Insufficient documentation

## 2014-01-09 LAB — COMPREHENSIVE METABOLIC PANEL
ALBUMIN: 4 g/dL (ref 3.5–5.2)
ALT: 37 U/L (ref 0–53)
AST: 42 U/L — AB (ref 0–37)
Alkaline Phosphatase: 84 U/L (ref 39–117)
Anion gap: 21 — ABNORMAL HIGH (ref 5–15)
BUN: 9 mg/dL (ref 6–23)
CALCIUM: 8.9 mg/dL (ref 8.4–10.5)
CHLORIDE: 95 meq/L — AB (ref 96–112)
CO2: 21 mEq/L (ref 19–32)
Creatinine, Ser: 0.86 mg/dL (ref 0.50–1.35)
GFR calc Af Amer: >90 mL/min (ref >90)
GFR calc non Af Amer: >90 mL/min (ref >90)
Glucose, Bld: 100 mg/dL — ABNORMAL HIGH (ref 70–99)
Potassium: 3.4 mEq/L — ABNORMAL LOW (ref 3.7–5.3)
Sodium: 137 mEq/L (ref 137–147)
Total Bilirubin: 0.6 mg/dL (ref 0.3–1.2)
Total Protein: 8.1 g/dL (ref 6.0–8.3)

## 2014-01-09 LAB — CBC
HCT: 42.7 % (ref 39.0–52.0)
HEMOGLOBIN: 14.9 g/dL (ref 13.0–17.0)
MCH: 31.8 pg (ref 26.0–34.0)
MCHC: 34.9 g/dL (ref 30.0–36.0)
MCV: 91.2 fL (ref 78.0–100.0)
Platelets: 373 10*3/uL (ref 150–400)
RBC: 4.68 MIL/uL (ref 4.22–5.81)
RDW: 13.8 % (ref 11.5–15.5)
WBC: 7.4 10*3/uL (ref 4.0–10.5)

## 2014-01-09 LAB — RAPID URINE DRUG SCREEN, HOSP PERFORMED
AMPHETAMINES: NOT DETECTED
BENZODIAZEPINES: NOT DETECTED
Barbiturates: NOT DETECTED
COCAINE: NOT DETECTED
Opiates: NOT DETECTED
Tetrahydrocannabinol: POSITIVE — AB

## 2014-01-09 LAB — ETHANOL: Alcohol, Ethyl (B): 283 mg/dL — ABNORMAL HIGH (ref 0–11)

## 2014-01-09 MED ORDER — MIRTAZAPINE 7.5 MG PO TABS
7.5000 mg | ORAL_TABLET | Freq: Every day | ORAL | Status: DC
  Filled 2014-01-09: qty 1

## 2014-01-09 MED ORDER — LORAZEPAM 1 MG PO TABS
0.0000 mg | ORAL_TABLET | Freq: Four times a day (QID) | ORAL | Status: DC
  Administered 2014-01-09: 2 mg via ORAL

## 2014-01-09 MED ORDER — ELVITEG-COBIC-EMTRICIT-TENOFDF 150-150-200-300 MG PO TABS: 1.0000 | ORAL_TABLET | Freq: Every day | ORAL | Status: DC

## 2014-01-09 MED ORDER — OLANZAPINE 10 MG PO TBDP
ORAL_TABLET | ORAL | Status: AC
  Administered 2014-01-09: 10 mg via ORAL
  Filled 2014-01-09: qty 1

## 2014-01-09 MED ORDER — LOPERAMIDE HCL 2 MG PO CAPS: 2.0000 mg | ORAL_CAPSULE | ORAL | Status: AC | PRN

## 2014-01-09 MED ORDER — DIVALPROEX SODIUM 500 MG PO DR TAB
500.0000 mg | DELAYED_RELEASE_TABLET | Freq: Two times a day (BID) | ORAL | Status: DC
  Administered 2014-01-09: 500 mg via ORAL
  Filled 2014-01-09: qty 1

## 2014-01-09 MED ORDER — OLANZAPINE 5 MG PO TBDP
15.0000 mg | ORAL_TABLET | Freq: Every day | ORAL | Status: DC
  Administered 2014-01-09: 15 mg via ORAL
  Filled 2014-01-09 (×4): qty 1

## 2014-01-09 MED ORDER — CHLORDIAZEPOXIDE HCL 25 MG PO CAPS
25.0000 mg | ORAL_CAPSULE | Freq: Once | ORAL | Status: AC
  Administered 2014-01-09: 25 mg via ORAL
  Filled 2014-01-09: qty 1

## 2014-01-09 MED ORDER — MIRTAZAPINE 7.5 MG PO TABS
7.5000 mg | ORAL_TABLET | Freq: Every day | ORAL | Status: DC
  Administered 2014-01-09: 7.5 mg via ORAL
  Filled 2014-01-09 (×3): qty 1

## 2014-01-09 MED ORDER — MAGNESIUM HYDROXIDE 400 MG/5ML PO SUSP: 30.0000 mL | Freq: Every day | ORAL | Status: DC | PRN

## 2014-01-09 MED ORDER — OLANZAPINE 10 MG PO TBDP
10.0000 mg | ORAL_TABLET | Freq: Every morning | ORAL | Status: DC
  Filled 2014-01-09 (×2): qty 1

## 2014-01-09 MED ORDER — ONDANSETRON 4 MG PO TBDP
8.0000 mg | ORAL_TABLET | Freq: Once | ORAL | Status: AC
  Administered 2014-01-09: 8 mg via ORAL
  Filled 2014-01-09: qty 2

## 2014-01-09 MED ORDER — HYDROXYZINE HCL 25 MG PO TABS: 25.0000 mg | ORAL_TABLET | Freq: Four times a day (QID) | ORAL | Status: AC | PRN

## 2014-01-09 MED ORDER — TRAZODONE HCL 100 MG PO TABS
100.0000 mg | ORAL_TABLET | Freq: Every day | ORAL | Status: DC
  Administered 2014-01-09: 100 mg via ORAL
  Filled 2014-01-09: qty 1

## 2014-01-09 MED ORDER — THIAMINE HCL 100 MG/ML IJ SOLN: 100.0000 mg | Freq: Once | INTRAMUSCULAR | Status: DC

## 2014-01-09 MED ORDER — OLANZAPINE 10 MG PO TBDP
10.0000 mg | ORAL_TABLET | Freq: Every morning | ORAL | Status: DC
  Administered 2014-01-09: 10 mg via ORAL

## 2014-01-09 MED ORDER — CHLORDIAZEPOXIDE HCL 25 MG PO CAPS: 25.0000 mg | ORAL_CAPSULE | Freq: Four times a day (QID) | ORAL | Status: AC | PRN

## 2014-01-09 MED ORDER — TRAZODONE HCL 100 MG PO TABS
100.0000 mg | ORAL_TABLET | Freq: Every day | ORAL | Status: DC
  Administered 2014-01-09: 100 mg via ORAL
  Filled 2014-01-09 (×3): qty 1

## 2014-01-09 MED ORDER — CHLORDIAZEPOXIDE HCL 25 MG PO CAPS
25.0000 mg | ORAL_CAPSULE | Freq: Four times a day (QID) | ORAL | Status: AC
  Administered 2014-01-09 – 2014-01-10 (×6): 25 mg via ORAL
  Filled 2014-01-09 (×6): qty 1

## 2014-01-09 MED ORDER — LORAZEPAM 1 MG PO TABS: 0.0000 mg | ORAL_TABLET | Freq: Two times a day (BID) | ORAL | Status: DC

## 2014-01-09 MED ORDER — VITAMIN B-1 100 MG PO TABS
100.0000 mg | ORAL_TABLET | Freq: Every day | ORAL | Status: DC
  Administered 2014-01-10 – 2014-01-15 (×6): 100 mg via ORAL
  Filled 2014-01-09 (×9): qty 1

## 2014-01-09 MED ORDER — OLANZAPINE 5 MG PO TBDP: 15.0000 mg | ORAL_TABLET | Freq: Every day | ORAL | Status: DC

## 2014-01-09 MED ORDER — ONDANSETRON 4 MG PO TBDP
4.0000 mg | ORAL_TABLET | Freq: Four times a day (QID) | ORAL | Status: AC | PRN
  Administered 2014-01-09: 4 mg via ORAL
  Filled 2014-01-09: qty 1

## 2014-01-09 MED ORDER — BENZTROPINE MESYLATE 1 MG PO TABS
0.5000 mg | ORAL_TABLET | Freq: Two times a day (BID) | ORAL | Status: DC
  Administered 2014-01-09: 0.5 mg via ORAL
  Filled 2014-01-09: qty 1

## 2014-01-09 MED ORDER — DIVALPROEX SODIUM 500 MG PO DR TAB
500.0000 mg | DELAYED_RELEASE_TABLET | Freq: Two times a day (BID) | ORAL | Status: DC
Start: 2014-01-09 — End: 2014-01-15
  Administered 2014-01-09 – 2014-01-15 (×12): 500 mg via ORAL
  Filled 2014-01-09 (×2): qty 1
  Filled 2014-01-09: qty 28
  Filled 2014-01-09 (×6): qty 1
  Filled 2014-01-09: qty 28
  Filled 2014-01-09 (×8): qty 1

## 2014-01-09 MED ORDER — CHLORDIAZEPOXIDE HCL 25 MG PO CAPS
25.0000 mg | ORAL_CAPSULE | Freq: Three times a day (TID) | ORAL | Status: AC
  Administered 2014-01-11 (×3): 25 mg via ORAL
  Filled 2014-01-09 (×2): qty 1

## 2014-01-09 MED ORDER — BENZTROPINE MESYLATE 1 MG PO TABS
0.5000 mg | ORAL_TABLET | Freq: Two times a day (BID) | ORAL | Status: DC
  Administered 2014-01-09 – 2014-01-15 (×12): 0.5 mg via ORAL
  Filled 2014-01-09 (×6): qty 1
  Filled 2014-01-09: qty 14
  Filled 2014-01-09 (×2): qty 1
  Filled 2014-01-09: qty 14
  Filled 2014-01-09 (×8): qty 1

## 2014-01-09 MED ORDER — ACETAMINOPHEN 325 MG PO TABS
650.0000 mg | ORAL_TABLET | Freq: Four times a day (QID) | ORAL | Status: DC | PRN
  Administered 2014-01-14: 650 mg via ORAL
  Filled 2014-01-09: qty 2

## 2014-01-09 MED ORDER — ALUM & MAG HYDROXIDE-SIMETH 200-200-20 MG/5ML PO SUSP
30.0000 mL | ORAL | Status: DC | PRN
  Administered 2014-01-12: 30 mL via ORAL

## 2014-01-09 MED ORDER — POTASSIUM CHLORIDE CRYS ER 20 MEQ PO TBCR
40.0000 meq | EXTENDED_RELEASE_TABLET | Freq: Once | ORAL | Status: AC
  Administered 2014-01-09: 40 meq via ORAL
  Filled 2014-01-09 (×2): qty 2

## 2014-01-09 MED ORDER — DARUNAVIR ETHANOLATE 800 MG PO TABS: 800.0000 mg | ORAL_TABLET | Freq: Every day | ORAL | Status: DC

## 2014-01-09 MED ORDER — ADULT MULTIVITAMIN W/MINERALS CH
1.0000 | ORAL_TABLET | Freq: Every day | ORAL | Status: DC
  Administered 2014-01-09 – 2014-01-15 (×7): 1 via ORAL
  Filled 2014-01-09 (×10): qty 1

## 2014-01-09 MED ORDER — ALBUTEROL SULFATE HFA 108 (90 BASE) MCG/ACT IN AERS
2.0000 | INHALATION_SPRAY | RESPIRATORY_TRACT | Status: DC | PRN
  Administered 2014-01-11 – 2014-01-15 (×4): 2 via RESPIRATORY_TRACT
  Filled 2014-01-09: qty 6.7

## 2014-01-09 MED ORDER — ALBUTEROL SULFATE HFA 108 (90 BASE) MCG/ACT IN AERS: 2.0000 | INHALATION_SPRAY | RESPIRATORY_TRACT | Status: DC | PRN

## 2014-01-09 MED ORDER — LORAZEPAM 1 MG PO TABS
ORAL_TABLET | ORAL | Status: AC
  Filled 2014-01-09: qty 2

## 2014-01-09 MED ORDER — CHLORDIAZEPOXIDE HCL 25 MG PO CAPS
25.0000 mg | ORAL_CAPSULE | ORAL | Status: AC
  Administered 2014-01-12 (×2): 25 mg via ORAL
  Filled 2014-01-09 (×3): qty 1

## 2014-01-09 MED ORDER — CHLORDIAZEPOXIDE HCL 25 MG PO CAPS
25.0000 mg | ORAL_CAPSULE | Freq: Every day | ORAL | Status: AC
  Administered 2014-01-13: 25 mg via ORAL
  Filled 2014-01-09: qty 1

## 2014-01-09 NOTE — ED Notes (Signed)
Pt presents via EMS with c/o SI. Pt reports that he has been drinking today, about 1/5 of liquor. Pt reported to EMS that he wanted to go to [facility].

## 2014-01-09 NOTE — Tx Team (Signed)
Initial Interdisciplinary Treatment Plan   PATIENT STRESSORS: Financial difficulties Health problems Medication change or noncompliance Substance abuse   PROBLEM LIST: Problem List/Patient Goals Date to be addressed Date deferred Reason deferred Estimated date of resolution  Substance Abuse 01/09/2014           Depression 01/09/2014           Risk for suicide 01/09/2014                              DISCHARGE CRITERIA:  Improved stabilization in mood, thinking, and/or behavior Verbal commitment to aftercare and medication compliance Withdrawal symptoms are absent or subacute and managed without 24-hour nursing intervention  PRELIMINARY DISCHARGE PLAN: Attend 12-step recovery group Outpatient therapy  PATIENT/FAMIILY INVOLVEMENT: This treatment plan has been presented to and reviewed with the patient, John [name].  The patient and family have been given the opportunity to ask questions and make suggestions.  Gaylan Gerold E 01/09/2014, 5:53 PM

## 2014-01-09 NOTE — ED Provider Notes (Signed)
CSN: [id number]     Arrival date & time 01/09/14  0100 History   First MD Initiated Contact with Patient 01/09/14 0108     Chief Complaint  Patient presents with  . Suicidal     (Consider location/radiation/quality/duration/timing/severity/associated sxs/prior Treatment) The history is provided by the patient.  pt with hx substance abuse, hiv, depression, states is homeless, and was thinking about the poor quality of his life, and became more depressed with thoughts of self harm.  Symptoms persistent/worse in past week, and esp tonight.  Denies specific plan, denies attempt to harm self, denies overdose. States he called ems to come to ED.  States his aunt has his normal meds, so has been without them for 'awhile'. +etoh abuse, cant quantify amount. Denies daily etoh use, denies hx of complicated etoh withdrawal or dts.  Pt states hx gout and has pain in region left great toe c/w prior. Denies foot injury. States otherwise physical health at baseline.   Past Medical History  Diagnosis Date  . Asthma   . HIV (human immunodeficiency virus infection)     "dx'd ~ 2 yr ago" (09/29/2012)  . Mental disorder   . Anxiety   . Depression   . Chronic low back pain   . Alcohol abuse   . HIV disease   . Hypertension   . Asthma    Past Surgical History  Procedure Laterality Date  . Skin graft full thickness leg Left ?    POSTERIOR LEFT LEG  AFTER DOG BITES   Family History  Problem Relation Age of Onset  . Alcoholism Mother   . Alcoholism Brother    History  Substance Use Topics  . Smoking status: Current Every Day Smoker -- 0.50 packs/day for 27 years    Types: Cigarettes  . Smokeless tobacco: Not on file  . Alcohol Use: 29.4 oz/week    49 Cans of beer per week     Comment: Daily use     Review of Systems  Constitutional: Negative for fever.  HENT: Negative for sore throat.   Eyes: Negative for redness.  Respiratory: Negative for shortness of breath.   Cardiovascular: Negative  for chest pain.  Gastrointestinal: Negative for vomiting, abdominal pain and diarrhea.  Endocrine: Negative for polyuria.  Genitourinary: Negative for flank pain.  Musculoskeletal: Negative for back pain and neck pain.  Skin: Negative for rash.  Neurological: Negative for headaches.  Hematological: Does not bruise/bleed easily.  Psychiatric/Behavioral: Positive for suicidal ideas and dysphoric mood.      Allergies  Shellfish allergy and Shellfish allergy  Home Medications   Prior to Admission medications   Medication Sig Start Date End Date Taking? Authorizing Provider  albuterol (PROVENTIL HFA;VENTOLIN HFA) 108 (90 BASE) MCG/ACT inhaler Inhale 2 puffs into the lungs every 4 (four) hours as needed for wheezing or shortness of breath.    Historical Provider, MD  benztropine (COGENTIN) 0.5 MG tablet Take 1 tablet (0.5 mg total) by mouth 2 (two) times daily. 11/26/13   Benjamine Mola, FNP  Darunavir Ethanolate (PREZISTA) 800 MG tablet Take 1 tablet (800 mg total) by mouth daily with breakfast. 12/04/13   Carlyle Basques, MD  divalproex (DEPAKOTE) 500 MG DR tablet Take 1 tablet (500 mg total) by mouth every 12 (twelve) hours. 11/26/13   Benjamine Mola, FNP  elvitegravir-cobicistat-emtricitabine-tenofovir (STRIBILD) 150-150-200-300 MG TABS tablet Take 1 tablet by mouth daily with breakfast. 12/04/13   Carlyle Basques, MD  gabapentin (NEURONTIN) 400 MG capsule Take 1 capsule (  400 mg total) by mouth 2 (two) times daily. 11/26/13   Benjamine Mola, FNP  hydrOXYzine (ATARAX/VISTARIL) 25 MG tablet Take 1 tablet (25 mg total) by mouth every 6 (six) hours as needed for anxiety. 11/26/13   Benjamine Mola, FNP  mirtazapine (REMERON) 7.5 MG tablet Take 1 tablet (7.5 mg total) by mouth at bedtime. 11/26/13   Benjamine Mola, FNP  OLANZapine zydis (ZYPREXA) 10 MG disintegrating tablet Take 1 tablet (10 mg total) by mouth every morning. 11/26/13   Benjamine Mola, FNP  OLANZapine zydis (ZYPREXA) 15 MG disintegrating  tablet Take 1 tablet (15 mg total) by mouth at bedtime. 11/26/13   Benjamine Mola, FNP  predniSONE (DELTASONE) 20 MG tablet 3 tabs po daily x 3 days. TAKE WITH BREAKFAST! 12/19/13   Patty Sermons Camprubi-Soms, PA-C  traZODone (DESYREL) 100 MG tablet Take 1 tablet (100 mg total) by mouth at bedtime. 11/26/13   Elyse Jarvis Withrow, FNP   BP 129/76  Pulse 110  Temp(Src) 98.5 F (36.9 C) (Oral)  Resp 22  SpO2 100% Physical Exam  Nursing note and vitals reviewed. Constitutional: He is oriented to person, place, and time. He appears well-developed and well-nourished. No distress.  HENT:  Head: Atraumatic.  Mouth/Throat: Oropharynx is clear and moist.  Eyes: Conjunctivae are normal. Pupils are equal, round, and reactive to light. No scleral icterus.  Neck: Neck supple. No tracheal deviation present.  Cardiovascular: Normal rate, regular rhythm, normal heart sounds and intact distal pulses.   Pulmonary/Chest: Effort normal and breath sounds normal. No accessory muscle usage. No respiratory distress. He exhibits no tenderness.  Abdominal: Soft. Bowel sounds are normal. He exhibits no distension. There is no tenderness.  Genitourinary:  No cva or flank tenderness  Musculoskeletal: Normal range of motion.  Tenderness left 1st mtp joint region. No cellulitis. Distal pulses palp.    Neurological: He is alert and oriented to person, place, and time.  Steady gait. No tremor or shakes.   Skin: Skin is warm and dry. He is not diaphoretic.  Psychiatric:  Depressed mood, tearful. +SI.      ED Course  Procedures (including critical care time)   Results for orders placed during the hospital encounter of 01/09/14  COMPREHENSIVE METABOLIC PANEL      Result Value Ref Range   Sodium 137  137 - 147 mEq/L   Potassium 3.4 (*) 3.7 - 5.3 mEq/L   Chloride 95 (*) 96 - 112 mEq/L   CO2 21  19 - 32 mEq/L   Glucose, Bld 100 (*) 70 - 99 mg/dL   BUN 9  6 - 23 mg/dL   Creatinine, Ser 0.86  0.50 - 1.35 mg/dL    Calcium 8.9  8.4 - 10.5 mg/dL   Total Protein 8.1  6.0 - 8.3 g/dL   Albumin 4.0  3.5 - 5.2 g/dL   AST 42 (*) 0 - 37 U/L   ALT 37  0 - 53 U/L   Alkaline Phosphatase 84  39 - 117 U/L   Total Bilirubin 0.6  0.3 - 1.2 mg/dL   GFR calc non Af Amer >90  >90 mL/min   GFR calc Af Amer >90  >90 mL/min   Anion gap 21 (*) 5 - 15  CBC      Result Value Ref Range   WBC 7.4  4.0 - 10.5 K/uL   RBC 4.68  4.22 - 5.81 MIL/uL   Hemoglobin 14.9  13.0 - 17.0 g/dL   HCT  42.7  39.0 - 52.0 %   MCV 91.2  78.0 - 100.0 fL   MCH 31.8  26.0 - 34.0 pg   MCHC 34.9  30.0 - 36.0 g/dL   RDW 13.8  11.5 - 15.5 %   Platelets 373  150 - 400 K/uL  URINE RAPID DRUG SCREEN (HOSP PERFORMED)      Result Value Ref Range   Opiates NONE DETECTED  NONE DETECTED   Cocaine NONE DETECTED  NONE DETECTED   Benzodiazepines NONE DETECTED  NONE DETECTED   Amphetamines NONE DETECTED  NONE DETECTED   Tetrahydrocannabinol POSITIVE (*) NONE DETECTED   Barbiturates NONE DETECTED  NONE DETECTED  ETHANOL      Result Value Ref Range   Alcohol, Ethyl (B) 283 (*) 0 - 11 mg/dL   Dg Chest 2 View  12/19/2013   CLINICAL DATA:  Shortness of breath.  Asthma.  EXAM: CHEST  2 VIEW  COMPARISON:  07/25/1998 58  FINDINGS: Stable minimal scarring at the right lung apex. Heart size within normal limits. Large lung volumes. Airway thickening is present, suggesting bronchitis or reactive airways disease.  IMPRESSION: 1. Airway thickening is present, suggesting bronchitis or reactive airways disease. Large lung volumes likely reflect air trapping. A component of emphysema is not readily excluded.   Electronically Signed   By: Sherryl Barters M.D.   On: 12/19/2013 11:02      MDM   Labs.  Reviewed nursing notes and prior charts for additional history.   Recheck, pt awake and alert, no tremor or shakes. Anxious.  Psych team consulted.    Recheck no new symptoms, pt comfortable.   Psych team eval pending, disposition per psych team.  Will place  psych holding orders and CIWA protocol.     Mirna Mires, MD 01/09/14 [phone]

## 2014-01-09 NOTE — Consult Note (Signed)
[facility] Consult   Reason for Consult:  Suicidal thoughts with plan and worsening depression Referring Physician: EDP Sheddrick Lattanzio is an 43 y.o. male. Total Time spent with patient: 45 minutes  Assessment: AXIS I:  Major depressive disorder, recurrent, severe with psychotic features              Alcohol dependence AXIS II:  Deferred AXIS III:   Past Medical History  Diagnosis Date  . Asthma   . HIV (human immunodeficiency virus infection)     "dx'd ~ 2 yr ago" (09/29/2012)  . Mental disorder   . Anxiety   . Depression   . Chronic low back pain   . Alcohol abuse   . HIV disease   . Hypertension   . Asthma    AXIS IV:  other psychosocial or environmental problems and problems related to social environment AXIS V:  1-10 persistent dangerousness to self and others present  Plan:  Recommend psychiatric Inpatient admission when medically cleared.  Subjective:   Treshawn Allen is a 43 y.o. male patient admitted with worsening depression, suicidal thoughts and psuychosis.  HPI:  Ezra Denne is a 43 year old AAM with history of depression and alcohol dependence. He presented to [facility] reporting suicidal thoughts with plan to stab himself in the neck. He is endorsing worsening depressive symptoms, feeling hopeless, worthless, and says that he believes nobody loves or care about him. Patient is also reporting command auditory hallucinations and says that he has been seeing giraffe, pink elephants and purple dinosaurs. He reports multiple prior history of suicide attempts and endorses homicidal ideations towards no one in particular. He has had multiple prior inpatient admissions to various hospitals but also has a strong history of non-compliant with medications and outpatient treatments. He states that he stopped taking his medications 2 months ago but has been self medicating by smoking Marijuana and drinking alcohol heavily on a daily basis. He is unsble to contract  for safety and will benefit from inpatient admission for stabilization.  HPI Elements: Location: depression ,psychosis,etoh.cocaine,marijuana abuse.  Quality: feels sad,irritable ,sleep issues ,has AH asking him to kill self and others,passive SI/HI.  Severity: severe.  Timing: constant.  Duration: chronic Context: noncompliant with meds,substance use disorder.  Past Psychiatric History: Past Medical History  Diagnosis Date  . Asthma   . HIV (human immunodeficiency virus infection)     "dx'd ~ 2 yr ago" (09/29/2012)  . Mental disorder   . Anxiety   . Depression   . Chronic low back pain   . Alcohol abuse   . HIV disease   . Hypertension   . Asthma     reports that he has been smoking Cigarettes.  He has a 13.5 pack-year smoking history. He does not have any smokeless tobacco history on file. He reports that he drinks about 29.4 ounces of alcohol per week. He reports that he uses illicit drugs (Marijuana and Cocaine) about 7 times per week. Family History  Problem Relation Age of Onset  . Alcoholism Mother   . Alcoholism Brother            Allergies:   Allergies  Allergen Reactions  . Shellfish Allergy Anaphylaxis and Swelling    ACT Assessment Complete:  Yes:    Educational Status    Risk to Self: Risk to self with the past 6 months Is patient at risk for suicide?: Yes Substance abuse history and/or treatment for substance abuse?: Yes  Risk to Others:  Abuse:    Prior Inpatient Therapy:    Prior Outpatient Therapy:    Additional Information:                    Objective: Blood pressure 97/42, pulse 112, temperature 98 F (36.7 C), temperature source Oral, resp. rate 17, SpO2 94.00%.There is no weight on file to calculate BMI. Results for orders placed during the hospital encounter of 01/09/14 (from the past 72 hour(s))  URINE RAPID DRUG SCREEN (HOSP PERFORMED)     Status: Abnormal   Collection Time    01/09/14  1:20 AM      Result Value Ref  Range   Opiates NONE DETECTED  NONE DETECTED   Cocaine NONE DETECTED  NONE DETECTED   Benzodiazepines NONE DETECTED  NONE DETECTED   Amphetamines NONE DETECTED  NONE DETECTED   Tetrahydrocannabinol POSITIVE (*) NONE DETECTED   Barbiturates NONE DETECTED  NONE DETECTED   Comment:            DRUG SCREEN FOR MEDICAL PURPOSES     ONLY.  IF CONFIRMATION IS NEEDED     FOR ANY PURPOSE, NOTIFY LAB     WITHIN 5 DAYS.                LOWEST DETECTABLE LIMITS     FOR URINE DRUG SCREEN     Drug Class       Cutoff (ng/mL)     Amphetamine      1000     Barbiturate      200     Benzodiazepine   185     Tricyclics       631     Opiates          300     Cocaine          300     THC              50  COMPREHENSIVE METABOLIC PANEL     Status: Abnormal   Collection Time    01/09/14  1:26 AM      Result Value Ref Range   Sodium 137  137 - 147 mEq/L   Comment: REPEATED TO VERIFY   Potassium 3.4 (*) 3.7 - 5.3 mEq/L   Comment: REPEATED TO VERIFY   Chloride 95 (*) 96 - 112 mEq/L   Comment: REPEATED TO VERIFY   CO2 21  19 - 32 mEq/L   Comment: REPEATED TO VERIFY   Glucose, Bld 100 (*) 70 - 99 mg/dL   BUN 9  6 - 23 mg/dL   Creatinine, Ser 0.86  0.50 - 1.35 mg/dL   Calcium 8.9  8.4 - 10.5 mg/dL   Total Protein 8.1  6.0 - 8.3 g/dL   Albumin 4.0  3.5 - 5.2 g/dL   AST 42 (*) 0 - 37 U/L   ALT 37  0 - 53 U/L   Alkaline Phosphatase 84  39 - 117 U/L   Total Bilirubin 0.6  0.3 - 1.2 mg/dL   GFR calc non Af Amer >90  >90 mL/min   GFR calc Af Amer >90  >90 mL/min   Comment: (NOTE)     The eGFR has been calculated using the CKD EPI equation.     This calculation has not been validated in all clinical situations.     eGFR's persistently <90 mL/min signify possible Chronic Kidney     Disease.   Anion gap 21 (*) 5 - 15  Comment: REPEATED TO VERIFY  CBC     Status: None   Collection Time    01/09/14  1:26 AM      Result Value Ref Range   WBC 7.4  4.0 - 10.5 K/uL   RBC 4.68  4.22 - 5.81 MIL/uL    Hemoglobin 14.9  13.0 - 17.0 g/dL   HCT 42.7  39.0 - 52.0 %   MCV 91.2  78.0 - 100.0 fL   MCH 31.8  26.0 - 34.0 pg   MCHC 34.9  30.0 - 36.0 g/dL   RDW 13.8  11.5 - 15.5 %   Platelets 373  150 - 400 K/uL  ETHANOL     Status: Abnormal   Collection Time    01/09/14  1:26 AM      Result Value Ref Range   Alcohol, Ethyl (B) 283 (*) 0 - 11 mg/dL   Comment:            LOWEST DETECTABLE LIMIT FOR     SERUM ALCOHOL IS 11 mg/dL     FOR MEDICAL PURPOSES ONLY   Labs are reviewed and are pertinent for as above.  Current Facility-Administered Medications  Medication Dose Route Frequency Provider Last Rate Last Dose  . albuterol (PROVENTIL HFA;VENTOLIN HFA) 108 (90 BASE) MCG/ACT inhaler 2 puff  2 puff Inhalation Q4H PRN Mirna Mires, MD      . benztropine (COGENTIN) tablet 0.5 mg  0.5 mg Oral BID Mirna Mires, MD   0.5 mg at 01/09/14 0348  . Darunavir Ethanolate (PREZISTA) tablet 800 mg  800 mg Oral Q breakfast Mirna Mires, MD      . divalproex (DEPAKOTE) DR tablet 500 mg  500 mg Oral Q12H Mirna Mires, MD   500 mg at 01/09/14 0348  . elvitegravir-cobicistat-emtricitabine-tenofovir (STRIBILD) 150-150-200-300 MG tablet 1 tablet  1 tablet Oral Q breakfast Mirna Mires, MD      . LORazepam (ATIVAN) tablet 0-4 mg  0-4 mg Oral 4 times per day Mirna Mires, MD   2 mg at 01/09/14 0350   Followed by  . [START ON 01/11/2014] LORazepam (ATIVAN) tablet 0-4 mg  0-4 mg Oral Q12H Mirna Mires, MD      . mirtazapine (REMERON) tablet 7.5 mg  7.5 mg Oral QHS Mirna Mires, MD      . OLANZapine zydis (ZYPREXA) disintegrating tablet 10 mg  10 mg Oral q morning - 10a Mirna Mires, MD   10 mg at 01/09/14 0925  . OLANZapine zydis (ZYPREXA) disintegrating tablet 15 mg  15 mg Oral QHS Mirna Mires, MD      . traZODone (DESYREL) tablet 100 mg  100 mg Oral QHS Mirna Mires, MD   100 mg at 01/09/14 0350   Current Outpatient Prescriptions  Medication Sig Dispense Refill  . divalproex (DEPAKOTE) 500 MG  DR tablet Take 1 tablet (500 mg total) by mouth every 12 (twelve) hours.  60 tablet  0  . OLANZapine zydis (ZYPREXA) 10 MG disintegrating tablet Take 1 tablet (10 mg total) by mouth every morning.  30 tablet  0  . OLANZapine zydis (ZYPREXA) 15 MG disintegrating tablet Take 1 tablet (15 mg total) by mouth at bedtime.  30 tablet  0  . venlafaxine XR (EFFEXOR-XR) 75 MG 24 hr capsule Take 75 mg by mouth daily with breakfast.      . albuterol (PROVENTIL HFA;VENTOLIN HFA) 108 (90 BASE) MCG/ACT inhaler Inhale 2 puffs into the  lungs every 4 (four) hours as needed for wheezing or shortness of breath.      . benztropine (COGENTIN) 0.5 MG tablet Take 1 tablet (0.5 mg total) by mouth 2 (two) times daily.  60 tablet  0  . Darunavir Ethanolate (PREZISTA) 800 MG tablet Take 1 tablet (800 mg total) by mouth daily with breakfast.  30 tablet  0  . elvitegravir-cobicistat-emtricitabine-tenofovir (STRIBILD) 150-150-200-300 MG TABS tablet Take 1 tablet by mouth daily with breakfast.  30 tablet  0  . gabapentin (NEURONTIN) 400 MG capsule Take 1 capsule (400 mg total) by mouth 2 (two) times daily.  60 capsule  0  . hydrOXYzine (ATARAX/VISTARIL) 25 MG tablet Take 1 tablet (25 mg total) by mouth every 6 (six) hours as needed for anxiety.  14 tablet  0  . mirtazapine (REMERON) 7.5 MG tablet Take 1 tablet (7.5 mg total) by mouth at bedtime.  30 tablet  0  . predniSONE (DELTASONE) 20 MG tablet 3 tabs po daily x 3 days. TAKE WITH BREAKFAST!  9 tablet  0  . traZODone (DESYREL) 100 MG tablet Take 1 tablet (100 mg total) by mouth at bedtime.  14 tablet  0    Psychiatric Specialty Exam:     Blood pressure 97/42, pulse 112, temperature 98 F (36.7 C), temperature source Oral, resp. rate 17, SpO2 94.00%.There is no weight on file to calculate BMI.  General Appearance: Disheveled  Eye Contact::  Minimal  Speech:  Slow  Volume:  Decreased  Mood:  Depressed and Hopeless  Affect:  Blunt  Thought Process:  Circumstantial   Orientation:  Full (Time, Place, and Person)  Thought Content:  Paranoid Ideation  Suicidal Thoughts:  Yes.  with intent/plan  Homicidal Thoughts:  Yes.  without intent/plan  Memory:  Immediate;   Fair Recent;   Fair Remote;   Fair  Judgement:  Impaired  Insight:  Lacking  Psychomotor Activity:  Decreased  Concentration:  Fair  Recall:  [employer] of Knowledge:Fair  Language: Good  Akathisia:  No  Handed:  Right  AIMS (if indicated):     Assets:  Communication Skills  Sleep:      Musculoskeletal: Strength & Muscle Tone: within normal limits Gait & Station: normal Patient leans: N/A  Treatment Plan Summary: Daily contact with patient to assess and evaluate symptoms and progress in treatment Medication management Patient will benefit from inpatient admission for stabilization  Corena Pilgrim, MD 01/09/2014 10:08 AM

## 2014-01-09 NOTE — BH Assessment (Signed)
Reviewed notes prior to initiating assessment.   Per [employer]. Pt was given medication and can not be assessed at this time.   Lear Ng, [facility] Triage Specialist 01/09/2014 4:50 AM

## 2014-01-09 NOTE — ED Notes (Addendum)
Pt has in belonging bag:  Purple bandana, white long sleeves shirt, white t-shirt, black pants, white socks, gray van shoes, red and blue jacket with animation on the jacket, red shoulder bag with toiletry bag (black hat, two cd's, black cell phone, 2 black charger, black portable charger, power stick deodorant, speed stick deodorant, blue water bottle, two inhaler, two dental floss, 2 book of matches, two pen, white wash cloth, 6 50 mg of TraZodone, nail flier, red and black change purse.

## 2014-01-09 NOTE — Progress Notes (Signed)
Patient ID: Taino Maertens, male   DOB: Mar 08, 1971, 43 y.o.   MRN: [id number]  Andon is a 42 year old A.A. Male admitted to [facility] voluntarily for SI, A/V hallucinations, and substance abuse. Patient reports drinking 4-5 four locos a day. Patient is positive for THC and admission BAL was 283. Patient was here in [facility] last month and has returned for these symptoms. Patient has a past medical history of HIV, HTN, Gout, Asthma, and chronic low back pain. Patient's last CIWA was 7 at 1733. Patient reports nausea and received PRN Zofran which was relieved upon reassessment. Patient returned early from dinner due to not feeling well. Patient was oriented to the unit and verbalized understanding of the admission process. Patient's potassium was 3.4, MD Cobos was advised and 1 time order for 40 mEq of KDUR was verbally received. Q15 minute safety checks were initiated and are maintained at this time.

## 2014-01-09 NOTE — ED Notes (Signed)
Bed: [facility] Expected date:  Expected time:  Means of arrival:  Comments: Tr2

## 2014-01-09 NOTE — ED Notes (Signed)
Patient presents cooperative, endorsing feelings of hopelessness and helplessness, states that he is seeing pink elephants and purple dinosaurs and hearing negative voices; states that he is suicidal with thoughts of taking his straw and stabbing himself in the neck. Also endorses HI towards no one in particular. NAD

## 2014-01-09 NOTE — Progress Notes (Signed)
D: Patient in the hallway on approach.  Patient states he is just getting to the unit.  Patient states he wants to get on his medication and get a good nights rest.  Patient states he feel dehydrated.  Patient was given ginger ale to drink.  Patient states he is passive SI but verbally contracts for safety.  Patient states he has HI towards "whoever" but states he will not hurt anyone here.  Patient states he hears voices telling him to harm himself and states she see pink elephants and purple giraffes.  Patient verbally contracts for safety. A: Staff to monitor Q 15 mins for safety.  Encouragement and support offered.  Scheduled medications administered per orders. R: Patient remains safe on the unit.  Patient did not attend group tonight.  Patient visible on the unit for medications.  Patient taking administered medications.

## 2014-01-10 DIAGNOSIS — F10239 Alcohol dependence with withdrawal, unspecified: Principal | ICD-10-CM

## 2014-01-10 DIAGNOSIS — F1290 Cannabis use, unspecified, uncomplicated: Secondary | ICD-10-CM

## 2014-01-10 DIAGNOSIS — R45850 Homicidal ideations: Secondary | ICD-10-CM

## 2014-01-10 DIAGNOSIS — Z720 Tobacco use: Secondary | ICD-10-CM

## 2014-01-10 DIAGNOSIS — F1410 Cocaine abuse, uncomplicated: Secondary | ICD-10-CM

## 2014-01-10 DIAGNOSIS — F3160 Bipolar disorder, current episode mixed, unspecified: Secondary | ICD-10-CM

## 2014-01-10 DIAGNOSIS — R45851 Suicidal ideations: Secondary | ICD-10-CM

## 2014-01-10 MED ORDER — BENZTROPINE MESYLATE 1 MG PO TABS: 1.0000 mg | ORAL_TABLET | Freq: Four times a day (QID) | ORAL | Status: DC | PRN

## 2014-01-10 MED ORDER — DOCUSATE SODIUM 100 MG PO CAPS
100.0000 mg | ORAL_CAPSULE | Freq: Two times a day (BID) | ORAL | Status: DC
Start: 2014-01-10 — End: 2014-01-15
  Administered 2014-01-10 – 2014-01-14 (×10): 100 mg via ORAL
  Filled 2014-01-10 (×14): qty 1

## 2014-01-10 MED ORDER — ASPIRIN EC 325 MG PO TBEC
DELAYED_RELEASE_TABLET | ORAL | Status: AC
  Filled 2014-01-10: qty 1

## 2014-01-10 MED ORDER — POTASSIUM CHLORIDE CRYS ER 20 MEQ PO TBCR
20.0000 meq | EXTENDED_RELEASE_TABLET | Freq: Every day | ORAL | Status: DC
  Administered 2014-01-10 – 2014-01-15 (×6): 20 meq via ORAL
  Filled 2014-01-10 (×9): qty 1

## 2014-01-10 MED ORDER — HALOPERIDOL 5 MG PO TABS: 5.0000 mg | ORAL_TABLET | Freq: Four times a day (QID) | ORAL | Status: DC | PRN

## 2014-01-10 MED ORDER — QUETIAPINE FUMARATE ER 50 MG PO TB24
100.0000 mg | ORAL_TABLET | Freq: Every day | ORAL | Status: DC
  Administered 2014-01-10: 100 mg via ORAL
  Filled 2014-01-10 (×3): qty 2

## 2014-01-10 MED ORDER — TRAZODONE HCL 100 MG PO TABS
100.0000 mg | ORAL_TABLET | Freq: Every evening | ORAL | Status: DC | PRN
  Administered 2014-01-10 – 2014-01-14 (×4): 100 mg via ORAL
  Filled 2014-01-10 (×4): qty 1

## 2014-01-10 MED ORDER — PANTOPRAZOLE SODIUM 20 MG PO TBEC
20.0000 mg | DELAYED_RELEASE_TABLET | Freq: Two times a day (BID) | ORAL | Status: DC
  Administered 2014-01-10 – 2014-01-15 (×10): 20 mg via ORAL
  Filled 2014-01-10 (×14): qty 1

## 2014-01-10 MED ORDER — POTASSIUM CHLORIDE CRYS ER 20 MEQ PO TBCR: 20.0000 meq | EXTENDED_RELEASE_TABLET | Freq: Two times a day (BID) | ORAL | Status: DC

## 2014-01-10 MED ORDER — ASPIRIN 81 MG PO CHEW
CHEWABLE_TABLET | ORAL | Status: AC
Start: 2014-01-10 — End: 2014-01-11
  Filled 2014-01-10: qty 4

## 2014-01-10 MED ORDER — OLANZAPINE 10 MG PO TBDP
10.0000 mg | ORAL_TABLET | Freq: Every day | ORAL | Status: DC
  Administered 2014-01-10: 10 mg via ORAL
  Filled 2014-01-10 (×3): qty 1

## 2014-01-10 NOTE — Progress Notes (Signed)
Patient ID: John [name], male   DOB: 02/26/1971, 43 y.o.   MRN: [id number]  [facility] Notes:  (Nursing/MHT/Case Management/Adjunct)  Date:  01/10/2014  Time:  11:40 AM  Type of Therapy:  Nurse Education  Participation Level:  Did Not Attend  Participation Quality:  did not attend  Affect:  did not attend  Cognitive:  did not attend  Insight:  None  Engagement in Group:  None  Modes of Intervention:  Discussion and Education  Summary of Progress/Problems: Patient did not attend group. Patient unable to attend group because he was speaking with Education officer, museum and MD.

## 2014-01-10 NOTE — Progress Notes (Signed)
Patient ID: John [name], male   DOB: [birth year], 43 y.o.   MRN: [id number]  D: Pt has a flat affect. Pt reports SI with no specific plan. When asked if he was going to act on his SI here at the hospital , pt stated "Not yet." Pt reports no A/VH. Per self inventory, pt slept poorly last night, low energy, and poor concentration. Rate depression 8, hopelessness 4 and anxiety 6. Pt reports feeling dizzy. Pt states that he does not have a goal to work on today and he will "focus" today.   A: Pt emotionally encouraged and supported. Pt given medications per MD orders.   R: Pt verbalizes that he will come to staff before acting on these thoughts. 1:1 ordered for pt by MD. Pt remains safe with 15 minute checks and 1:1 personell. Pt took a bath this morning and has had a good appetite today.    Elenore Rota, RN

## 2014-01-10 NOTE — Progress Notes (Signed)
Patient ID: John [name], male   DOB: 11-29-1970, 43 y.o.   MRN: [id number] 1:1 order remains. Pt observed eating dinner tray in dayroom. Pt not in any distress. Staff sitting next to patient on couch.  Elenore Rota, RN

## 2014-01-10 NOTE — Progress Notes (Signed)
Patient ID: John [name], male   DOB: [birth year], 43 y.o.   MRN: [id number]  1:1 Nursing Note-  Patient in his room speaking to the Administrative Coordinator when approached. Patient reports that he feels like he is unsafe at this time. MHT is within reach and 1:1 is started for safety. Patient appears depressed but no distress noted. Patient's respirations are even and unlabored at this time. Q15 minute safety checks are maintained as well.

## 2014-01-10 NOTE — Progress Notes (Signed)
Pt is currently sitting in dayroom during group programming. Pt is in and out of a sleep state. Pt remains safe with 1:1 monitoring. Monitor is sitting in chair directly behind patient. Pt is stable at this time. Elenore Rota, RN

## 2014-01-10 NOTE — Progress Notes (Signed)
Pt attended group. Pt did not participate in group. Pt looked at magazine for the entire group.

## 2014-01-10 NOTE — [facility] Group Notes (Signed)
[place] LCSW Group Therapy  01/10/2014 1:47 PM   Type of Therapy:  Group Therapy  Participation Level:  Active  Participation Quality:  Attentive  Affect:  Appropriate  Cognitive:  Appropriate  Insight:  Improving  Engagement in Therapy:  Engaged  Modes of Intervention:  Clarification, Education, Exploration and Socialization  Summary of Progress/Problems: Today's group focused on relapse prevention.  We defined the term, and then brainstormed on ways to prevent relapse. "Recovery is getting better, and relapse is when you pick up again."  It was work to get this much information from [place].  He alternated between looking at a magazine and resting his head on the cushion back with his eyes closed.  Was willing to answer specific questions, but usually with one sentence answers.  Admitted that the first step to stop drinking is coming to the hospital for help.   Roque Lias B 01/10/2014 , 1:47 PM
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
## 2014-01-10 NOTE — Plan of Care (Signed)
Problem: Diagnosis: Increased Risk For Suicide Attempt Goal: LTG-Patient Will Report Absence of Withdrawal Symptoms LTG (by discharge): Patient will report absence of withdrawal symptoms.  Outcome: Not Progressing Patient still having signs of withdrawal and able to verbalize to staff when he needs medication to help with symptoms

## 2014-01-10 NOTE — Progress Notes (Signed)
Patient ID: John [name], male   DOB: 12/09/1970, 43 y.o.   MRN: [id number] D: Client in bed, sleeping, reports AVH, "I see pink elephants" "voices tell me to hurt self, but I ain't" client contracts for safety. A: Writer assess client, provided emotional support, encouraged group, reviewed medication. Staff will monitor 1:1 for safety. R: Client is safe on the unit, did not attend group, but up for snacks.

## 2014-01-10 NOTE — [facility] Suicide Risk Assessment (Signed)
   Nursing information obtained from:  Patient Demographic factors:  Male;Low socioeconomic status;Unemployed Current Mental Status:  Suicidal ideation indicated by patient;Self-harm thoughts Loss Factors:  Decline in physical health;Financial problems / change in socioeconomic status Historical Factors:  Prior suicide attempts;Family history of mental illness or substance abuse Risk Reduction Factors:  Religious beliefs about death Total Time spent with patient: 45 minutes  CLINICAL FACTORS:   Alcohol/Substance Abuse/Dependencies Currently Psychotic Previous Psychiatric Diagnoses and Treatments Medical Diagnoses and Treatments/Surgeries  Psychiatric Specialty Exam: Physical Exam Please see H&P.   ROS Please see H&P.   Blood pressure 124/72, pulse 104, temperature 98 F (36.7 C), temperature source Oral, resp. rate 18, height 5' 7.5" (1.715 m), weight 90.719 kg (200 lb), SpO2 98.00%.Body mass index is 30.84 kg/(m^2).   SUICIDE RISK:   Severe:  Frequent, intense, and enduring suicidal ideation, specific plan, no subjective intent, but some objective markers of intent (i.e., choice of lethal method), the method is accessible, some limited preparatory behavior, evidence of impaired self-control, severe dysphoria/symptomatology, multiple risk factors present, and few if any protective factors, particularly a lack of social support.  PLAN OF CARE:Please see H&P.   I certify that inpatient services furnished can reasonably be expected to improve the patient's condition.  Michala Deblanc MD 01/10/2014, 9:37 AM

## 2014-01-11 LAB — COMPREHENSIVE METABOLIC PANEL
ALBUMIN: 2.9 g/dL — AB (ref 3.5–5.2)
ALK PHOS: 99 U/L (ref 39–117)
ALT: 23 U/L (ref 0–53)
AST: 19 U/L (ref 0–37)
Anion gap: 14 (ref 5–15)
BUN: 11 mg/dL (ref 6–23)
CHLORIDE: 105 meq/L (ref 96–112)
CO2: 24 mEq/L (ref 19–32)
Calcium: 8.7 mg/dL (ref 8.4–10.5)
Creatinine, Ser: 0.83 mg/dL (ref 0.50–1.35)
GFR calc Af Amer: >90 mL/min (ref >90)
GFR calc non Af Amer: >90 mL/min (ref >90)
Glucose, Bld: 125 mg/dL — ABNORMAL HIGH (ref 70–99)
POTASSIUM: 4.1 meq/L (ref 3.7–5.3)
Sodium: 143 mEq/L (ref 137–147)
Total Protein: 6.5 g/dL (ref 6.0–8.3)

## 2014-01-11 LAB — LIPID PANEL
Cholesterol: 120 mg/dL (ref 0–200)
HDL: 51 mg/dL (ref >39)
LDL CALC: 39 mg/dL (ref 0–99)
TRIGLYCERIDES: 150 mg/dL — AB (ref <150)
Total CHOL/HDL Ratio: 2.4 RATIO
VLDL: 30 mg/dL (ref 0–40)

## 2014-01-11 LAB — HEMOGLOBIN A1C
HEMOGLOBIN A1C: 5.6 % (ref <5.7)
Mean Plasma Glucose: 114 mg/dL (ref <117)

## 2014-01-11 LAB — TSH: TSH: 1.66 u[IU]/mL (ref 0.350–4.500)

## 2014-01-11 MED ORDER — OLANZAPINE 5 MG PO TBDP
5.0000 mg | ORAL_TABLET | Freq: Every day | ORAL | Status: AC
  Administered 2014-01-11 – 2014-01-12 (×2): 5 mg via ORAL
  Filled 2014-01-11 (×2): qty 1

## 2014-01-11 MED ORDER — QUETIAPINE FUMARATE ER 200 MG PO TB24
200.0000 mg | ORAL_TABLET | Freq: Every day | ORAL | Status: DC
  Administered 2014-01-11 – 2014-01-12 (×2): 200 mg via ORAL
  Filled 2014-01-11 (×4): qty 1

## 2014-01-11 NOTE — [facility] Group Notes (Signed)
Adult Psychoeducational Group Note  Date:  01/11/2014 Time:  10:09 PM  Group Topic/Focus:  Wrap-Up Group:   The focus of this group is to help patients review their daily goal of treatment and discuss progress on daily workbooks.  Participation Level:  Minimal  Participation Quality:  Drowsy  Affect:  Flat  Cognitive: Appropriate   Insight: Lacking  Engagement in Group:  Lacking  Modes of Intervention:  Discussion  Additional Comments:  Benjamin said his day was all right.  He stated he has no discharge plans, no coping skills and no goal for the day.  [name] A 01/11/2014, 10:09 PM

## 2014-01-11 NOTE — [facility] Group Notes (Signed)
[place] LCSW Group Therapy  01/11/2014  1:05 PM  Type of Therapy:  Group therapy  Participation Level:  Active  Participation Quality:  Tuned out  Affect:  Flat  Cognitive:  Oriented  Insight:  Limited  Engagement in Therapy:  Limited  Modes of Intervention:  Discussion, Socialization  Summary of Progress/Problems:  Chaplain was here to lead a group on themes of hope and courage.  Slept, quietly snoring during group. "I'd feel cared for here if the Dr would put me back on the medication that really works for me."  Roque Lias B 01/11/2014 1:26 PM

## 2014-01-11 NOTE — Progress Notes (Signed)
Pt attended music therapy group this morning with nursing students. Pt participate in group.

## 2014-01-11 NOTE — Progress Notes (Signed)
[facility] 1:1 Observation Documentation  For the first (8) hours following discontinuation of 1:1 precautions, a progress note entry by nursing staff should be documented at least every 2 hours, reflecting the patient's behavior, condition, mood, and conversation.  Use the progress notes for additional entries.  Time 1:1 discontinued: 8:30 AM  Patient's Behavior: Appropriate to situation  Patient's Condition: Patient is resting in bed; even labored breathing; no s/s of distress noted  Patient's Conversation: No interaction with others  Kathlen Brunswick 01/11/2014, 3:54 PM

## 2014-01-11 NOTE — Progress Notes (Signed)
[place] [facility] 1:1 Observation Documentation  For the first (8) hours following discontinuation of 1:1 precautions, a progress note entry by nursing staff should be documented at least every 2 hours, reflecting the patient's behavior, condition, mood, and conversation.  Use the progress notes for additional entries.  Time 1:1 discontinued: 8:30 AM  Patient's Behavior: calm, appropriate to situation  Patient's Condition: flat, blunted affect; depressed mood  Patient's Conversation: Minimal to no interaction with staff and peers on the hall  John [name] 01/11/2014, 4:32 PM

## 2014-01-11 NOTE — Progress Notes (Signed)
Patient ID: John [name], male   DOB: [birth year], 43 y.o.   MRN: [id number] D: Client in bed most of this shift, reports "getting some rest" also notes AVH have decreased, denies SHI. A: Writer provided emotional support, encourage client to get up to prevent insomnia tonight. Staff will monitor q77min for safety. R: Client is safe on the unit, attended group.

## 2014-01-11 NOTE — [facility] Suicide Risk Assessment (Signed)
[place] INPATIENT:  Family/Significant Other Suicide Prevention Education  Suicide Prevention Education:  Patient Refusal for Family/Significant Other Suicide Prevention Education: The patient John [name] has refused to provide written consent for family/significant other to be provided Family/Significant Other Suicide Prevention Education during admission and/or prior to discharge.  Physician notified.  Hyatt,Matheo Rathbone 01/11/2014, 3:58 PM

## 2014-01-11 NOTE — Progress Notes (Signed)
D: Patient presents with blunted, depressed affect and mood. He reported on the self inventory sheet that both his sleep and appetite are good, energy level is low and ability to concentrate is poor. Patient rates depression/feelings of hopelessness "8" and anxiety "7". He's been up today in the dayroom, attending groups and visible in the milieu. Patient is compliant with medications.  A: Support and encouragement provided to patient. Scheduled medications administered per MD orders. Maintain Q15 minute checks for safety.  R: Patient receptive. Denies SI/HI and AVH at this time. Patient remains safe.

## 2014-01-11 NOTE — Plan of Care (Signed)
Problem: Diagnosis: Increased Risk For Suicide Attempt Goal: LTG-Patient Will Show Positive Response to Medication LTG (by discharge) : Patient will show positive response to medication and will participate in the development of the discharge plan.  Outcome: Progressing Client will show positive response to medications by reporting relief of positive symptoms. Client will show positive response to medications AEB decrease of AVH.

## 2014-01-11 NOTE — Progress Notes (Signed)
[facility] MD Progress Note  01/11/2014 12:55 PM John [name]  MRN:  [id number] Subjective: Pt states, ' I am feeling angry ,I feel like killing anybody in this building ,I still hear voices ,they are worse now." Objective: Patient seen and chart reviewed.Pt reports irritable mood ,anxiety ,AH as well as HI. Pt continues to have BL tremors ,came in intoxicated with alcohol ,noncompliant on medications. Pt denies any SI today and reports being able to cope better with his thoughts. Pt reports sleep as good last night. Per staff continues to be irritable ,has mood lability ,is compliant on medications.   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 rate 17, height 5' 7.5" (1.715 m), weight 90.719 kg (200 lb), SpO2 98.00%.Body mass index is 30.84 kg/(m^2).  General Appearance: Disheveled  Eye Contact::  Minimal  Speech:  Normal Rate  Volume:  Normal  Mood:  Angry, Anxious, Hopeless and Irritable  Affect:  Labile  Thought Process:  Disorganized  Orientation:  Full (Time, Place, and Person)  Thought Content:  Hallucinations: Auditory also endorsed VH on presentation  Suicidal Thoughts:  No  Homicidal Thoughts:  Yes.  without intent/plan  Memory:  Immediate;   Fair Recent;   Fair Remote;   Fair  Judgement:  Impaired  Insight:   Lacking  Psychomotor Activity:  Normal  Concentration:  Fair  Recall:  [place]  Language: Fair  Akathisia:  No  Handed:  Right  AIMS (if indicated):     Assets:  Communication Skills Desire for Improvement  Sleep:  Number of Hours: 5.25   Musculoskeletal: Strength & Muscle Tone: within normal limits Gait & Station: normal Patient leans: N/A  Current Medications: Current Facility-Administered Medications  Medication Dose Route Frequency Provider Last Rate Last Dose  . acetaminophen (TYLENOL) tablet 650 mg  650 mg Oral Q6H PRN Shuvon Rankin, NP      . albuterol (PROVENTIL HFA;VENTOLIN HFA) 108 (90 BASE) MCG/ACT inhaler 2 puff  2 puff Inhalation Q4H PRN Shuvon Rankin, NP      . alum & mag hydroxide-simeth (MAALOX/MYLANTA) 200-200-20 MG/5ML suspension 30 mL  30 mL Oral Q4H PRN Shuvon Rankin, NP      . benztropine (COGENTIN) tablet 0.5 mg  0.5 mg Oral BID Shuvon Rankin, NP   0.5 mg at 01/11/14 0815  . haloperidol (HALDOL) tablet 5 mg  5 mg Oral Q6H PRN Ursula Alert, MD       And  . benztropine (COGENTIN) tablet 1 mg  1 mg Oral Q6H PRN Ursula Alert, MD      . chlordiazePOXIDE (LIBRIUM) capsule 25 mg  25 mg Oral Q6H PRN Shuvon Rankin, NP      . chlordiazePOXIDE (LIBRIUM) capsule 25 mg  25 mg Oral TID Shuvon Rankin, NP   25 mg at 01/11/14 1158   Followed by  . [  START ON 01/12/2014] chlordiazePOXIDE (LIBRIUM) capsule 25 mg  25 mg Oral BH-qamhs Shuvon Rankin, NP       Followed by  . [START ON 01/13/2014] chlordiazePOXIDE (LIBRIUM) capsule 25 mg  25 mg Oral Daily Shuvon Rankin, NP      . divalproex (DEPAKOTE) DR tablet 500 mg  500 mg Oral Q12H Shuvon Rankin, NP   500 mg at 01/11/14 0814  . docusate sodium (COLACE) capsule 100 mg  100 mg Oral BID Ursula Alert, MD   100 mg at 01/11/14 4193  . hydrOXYzine (ATARAX/VISTARIL) tablet 25 mg  25 mg Oral Q6H PRN Shuvon Rankin, NP      . loperamide (IMODIUM) capsule 2-4 mg  2-4 mg Oral PRN Shuvon Rankin, NP      . magnesium  hydroxide (MILK OF MAGNESIA) suspension 30 mL  30 mL Oral Daily PRN Shuvon Rankin, NP      . multivitamin with minerals tablet 1 tablet  1 tablet Oral Daily Shuvon Rankin, NP   1 tablet at 01/11/14 0815  . OLANZapine zydis (ZYPREXA) disintegrating tablet 5 mg  5 mg Oral QHS Shamere Campas, MD      . ondansetron (ZOFRAN-ODT) disintegrating tablet 4 mg  4 mg Oral Q6H PRN Shuvon Rankin, NP   4 mg at 01/09/14 1730  . pantoprazole (PROTONIX) EC tablet 20 mg  20 mg Oral BID AC Ursula Alert, MD   20 mg at 01/11/14 0624  . potassium chloride SA (K-DUR,KLOR-CON) CR tablet 20 mEq  20 mEq Oral Daily Ursula Alert, MD   20 mEq at 01/11/14 0814  . QUEtiapine (SEROQUEL XR) 24 hr tablet 200 mg  200 mg Oral QHS Doren Kaspar, MD      . thiamine (B-1) injection 100 mg  100 mg Intramuscular Once Shuvon Rankin, NP      . thiamine (VITAMIN B-1) tablet 100 mg  100 mg Oral Daily Shuvon Rankin, NP   100 mg at 01/11/14 0815  . traZODone (DESYREL) tablet 100 mg  100 mg Oral QHS PRN Ursula Alert, MD   100 mg at 01/10/14 2145    Lab Results:  Results for orders placed during the hospital encounter of 01/09/14 (from the past 48 hour(s))  LIPID PANEL     Status: Abnormal   Collection Time    01/11/14  6:08 AM      Result Value Ref Range   Cholesterol 120  0 - 200 mg/dL   Triglycerides 150 (*) <150 mg/dL   HDL 51  >39 mg/dL   Total CHOL/HDL Ratio 2.4     VLDL 30  0 - 40 mg/dL   LDL Cholesterol 39  0 - 99 mg/dL   Comment:            Total Cholesterol/HDL:CHD Risk     Coronary Heart Disease Risk Table                         Men   Women      1/2 Average Risk   3.4   3.3      Average Risk       5.0   4.4      2 X Average Risk   9.6   7.1      3 X Average Risk  23.4   11.0                Use the calculated Patient Ratio     above and the  CHD Risk Table     to determine the patient's CHD Risk.                ATP III CLASSIFICATION (LDL):      <100     mg/dL   Optimal      100-129  mg/dL   Near or Above                         Optimal      130-159  mg/dL   Borderline      160-189  mg/dL   High      >190     mg/dL   Very High     Performed at [facility]  TSH     Status: None   Collection Time    01/11/14  6:08 AM      Result Value Ref Range   TSH 1.660  0.350 - 4.500 uIU/mL   Comment: Performed at [place] PANEL     Status: Abnormal   Collection Time    01/11/14  6:08 AM      Result Value Ref Range   Sodium 143  137 - 147 mEq/L   Potassium 4.1  3.7 - 5.3 mEq/L   Chloride 105  96 - 112 mEq/L   CO2 24  19 - 32 mEq/L   Glucose, Bld 125 (*) 70 - 99 mg/dL   BUN 11  6 - 23 mg/dL   Creatinine, Ser 0.83  0.50 - 1.35 mg/dL   Calcium 8.7  8.4 - 10.5 mg/dL   Total Protein 6.5  6.0 - 8.3 g/dL   Albumin 2.9 (*) 3.5 - 5.2 g/dL   AST 19  0 - 37 U/L   ALT 23  0 - 53 U/L   Alkaline Phosphatase 99  39 - 117 U/L   Total Bilirubin <0.2 (*) 0.3 - 1.2 mg/dL   GFR calc non Af Amer >90  >90 mL/min   GFR calc Af Amer >90  >90 mL/min   Comment: (NOTE)     The eGFR has been calculated using the CKD EPI equation.     This calculation has not been validated in all clinical situations.     eGFR's persistently <90 mL/min signify possible Chronic Kidney     Disease.   Anion gap 14  5 - 15   Comment: Performed at [facility]    Physical Findings: AIMS: Facial and Oral Movements Muscles of Facial Expression: None, normal Lips and Perioral Area: None, normal Jaw: None, normal Tongue: None, normal,Extremity Movements Upper (arms, wrists, hands, fingers): None, normal Lower (legs, knees, ankles, toes): None, normal, Trunk Movements Neck, shoulders, hips: None, normal, Overall Severity Severity of abnormal movements (highest score from questions above): None, normal Incapacitation due to abnormal movements: None, normal Patient's awareness of abnormal movements (rate only patient's report): No Awareness, Dental Status Current problems with  teeth and/or dentures?: No Does patient usually wear dentures?: No  CIWA:  CIWA-Ar Total: 3 COWS:     Treatment Plan Summary: Daily contact with patient to assess and evaluate symptoms and progress in treatment Medication management  Plan: Will continue patient on CIWA as well as Librium protocol for alcohol withdrawal symptoms.Patient has significant substance use problem- Will refer to substance abuse program after stabilization.  Will restart patient on his medications with changes as noted.   Will increase Seroquel XR to 200 mg po  qhs for psychosis,mood lability,sleep, he reports good efficacy from it in the past ,will cross taper with Zyprexa.  Will continue Depakote DR 500 mg po bid for mood lability. Depakote level on 01/13/2014. Will continue Trazodone 100 mg po qhs for sleep ,but will make it prn.  Will make prn's available for anxiety sx.  Will restart home medications. Will add Colace 100 mg po bid for constipation.  Will add Protonix 20 mg po daily for GI issues.   FOR HIV -patient referred to ID clinic during last admission. Will have pt follow up .  Will provide nicotine patch for smoking cessation.  CSW will work on disposition.   Will discontinue 1;1 -continue fall precaution.      Medical Decision Making Problem Points:  Established problem, stable/improving (1) and Review of last therapy session (1) Data Points:  Order Aims Assessment (2) Review or order clinical lab tests (1) Review and summation of old records (2) Review of medication regiment & side effects (2) Review of new medications or change in dosage (2)  I certify that inpatient services furnished can reasonably be expected to improve the patient's condition.   Ziaire Hagos MD 01/11/2014, 12:55 PM

## 2014-01-11 NOTE — Progress Notes (Signed)
[facility] 1:1 Observation Documentation  For the first (8) hours following discontinuation of 1:1 precautions, a progress note entry by nursing staff should be documented at least every 2 hours, reflecting the patient's behavior, condition, mood, and conversation.  Use the progress notes for additional entries.  Time 1:1 discontinued: 8:30 AM   Patient's Behavior: Calm, cooperative  Patient's Condition: Flat, blunted affect and depressed mood  Patient's Conversation: Patient does not endorse SI at this time, but voices that he's upset with some staff. Writer asked patient to elaborate, but no response.   Kathlen Brunswick 01/11/2014, 8:43 AM

## 2014-01-11 NOTE — Plan of Care (Signed)
Problem: Diagnosis: Increased Risk For Suicide Attempt Goal: STG-Patient Will Attend All Groups On The Unit Outcome: Not Progressing Client will attend and participate in group discussion.

## 2014-01-11 NOTE — Progress Notes (Signed)
[facility] 1:1 Observation Documentation  For the first (8) hours following discontinuation of 1:1 precautions, a progress note entry by nursing staff should be documented at least every 2 hours, reflecting the patient's behavior, condition, mood, and conversation.  Use the progress notes for additional entries.  Time 1:1 discontinued: 8:30 AM   Patient's Behavior: Appropriate to circumstance   Patient's Condition: Continues to have blunted affect and depressed mood   Patient's Conversation: Still voicing that he's mad at everyone in the building, but not stating any specific reasons.  Eduard Roux E 01/11/2014, 10:30 AM

## 2014-01-11 NOTE — Progress Notes (Signed)
[facility] 1:1 Observation Documentation  For the first (8) hours following discontinuation of 1:1 precautions, a progress note entry by nursing staff should be documented at least every 2 hours, reflecting the patient's behavior, condition, mood, and conversation.  Use the progress notes for additional entries.  Time 1:1 discontinued: 8:30 AM   Patient's Behavior: calm, cooperative  Patient's Condition: Patient eating lunch at this time; minimal interaction with peers.  Patient's Conversation: pt. no longer voicing to be upset with staff; more appropriate affect.  Eduard Roux E 01/11/2014, 12:30 PM

## 2014-01-12 DIAGNOSIS — F1920 Other psychoactive substance dependence, uncomplicated: Secondary | ICD-10-CM

## 2014-01-12 NOTE — Progress Notes (Signed)
Patient ID: John [name], male   DOB: [birth year], 43 y.o.   MRN: [id number] [facility] MD Progress Note  01/12/2014 2:27 PM John [name]  MRN:  [id number] Subjective:  Patient reports he " does not feel good" , and describes bizarre visual hallucinations. States " this  Tommye Standard keeps on poking its head through the door and then leaving through the window".  He complains of toe pain and inflammation related to gout. Objective:  I have discussed case with Nursing Staff and met with patient along with RN. Patient is  Reporting visual hallucinations as above, but does not appear to be internally preoccupied or bizarre and his thought process is linear and well organized. He is not presenting with tremors or diaphoresis, BP stable, he is tachycardic Patient spends most time in room , but has gone to some groups. No disruptive or agitated behaviors on unit.   Diagnosis:  Polysubstance Dependence, to include cocaine, cannabis, alcohol. Bipolar Disorder by history    Total Time spent with patient: 20 minutes   ADL's:  Poor   Sleep: improved   Appetite:  Good    Psychiatric Specialty Exam: Physical Exam  ROS  Blood pressure 129/86, pulse 120, temperature 97.5 F (36.4 C), temperature source Oral, resp. rate 20, height 5' 7.5" (1.715 m), weight 90.719 kg (200 lb), SpO2 98.00%.Body mass index is 30.84 kg/(m^2).  General Appearance: Disheveled  Eye Contact::  Good  Speech:  Normal Rate  Volume:  Normal  Mood:  Irritable  Affect:  irritable but less labile , as per description of initial presentation  Thought Process:  Linear- no gross thought disorder noted at this time  Orientation:  Full (Time, Place, and Person)  Thought Content:  endorses vivid visual hallucinations, but does not appear internally preoccupied or bizarre    Suicidal Thoughts:  No  Homicidal Thoughts:  Yes.  without intent/plan- denies any specific plan or intent of hurting anyone and contracts for safety on unit at this  time  Memory:  Fair   Judgement:  Impaired  Insight:  Lacking  Psychomotor Activity:  Normal- no agitation at this time  Concentration:  Fair  Recall:  [employer] of Knowledge:Fair  Language: Fair  Akathisia:  No  Handed:  Right  AIMS (if indicated):     Assets:  Communication Skills Desire for Improvement  Sleep:  Number of Hours: 6.25   Musculoskeletal: Strength & Muscle Tone: within normal limits Gait & Station: normal Patient leans: N/A  Current Medications: Current Facility-Administered Medications  Medication Dose Route Frequency Provider Last Rate Last Dose  . acetaminophen (TYLENOL) tablet 650 mg  650 mg Oral Q6H PRN Shuvon Rankin, NP      . albuterol (PROVENTIL HFA;VENTOLIN HFA) 108 (90 BASE) MCG/ACT inhaler 2 puff  2 puff Inhalation Q4H PRN Shuvon Rankin, NP   2 puff at 01/11/14 1710  . alum & mag hydroxide-simeth (MAALOX/MYLANTA) 200-200-20 MG/5ML suspension 30 mL  30 mL Oral Q4H PRN Shuvon Rankin, NP      . benztropine (COGENTIN) tablet 0.5 mg  0.5 mg Oral BID Shuvon Rankin, NP   0.5 mg at 01/12/14 3267  . haloperidol (HALDOL) tablet 5 mg  5 mg Oral Q6H PRN Ursula Alert, MD       And  . benztropine (COGENTIN) tablet 1 mg  1 mg Oral Q6H PRN Saramma Eappen, MD      . chlordiazePOXIDE (LIBRIUM) capsule 25 mg  25 mg Oral Q6H PRN Shuvon Rankin, NP      .  chlordiazePOXIDE (LIBRIUM) capsule 25 mg  25 mg Oral BH-qamhs Shuvon Rankin, NP   25 mg at 01/12/14 0832   Followed by  . [START ON 01/13/2014] chlordiazePOXIDE (LIBRIUM) capsule 25 mg  25 mg Oral Daily Shuvon Rankin, NP      . divalproex (DEPAKOTE) DR tablet 500 mg  500 mg Oral Q12H Shuvon Rankin, NP   500 mg at 01/12/14 0832  . docusate sodium (COLACE) capsule 100 mg  100 mg Oral BID Saramma Eappen, MD   100 mg at 01/12/14 0832  . hydrOXYzine (ATARAX/VISTARIL) tablet 25 mg  25 mg Oral Q6H PRN Shuvon Rankin, NP      . loperamide (IMODIUM) capsule 2-4 mg  2-4 mg Oral PRN Shuvon Rankin, NP      . magnesium hydroxide  (MILK OF MAGNESIA) suspension 30 mL  30 mL Oral Daily PRN Shuvon Rankin, NP      . multivitamin with minerals tablet 1 tablet  1 tablet Oral Daily Shuvon Rankin, NP   1 tablet at 01/12/14 0832  . OLANZapine zydis (ZYPREXA) disintegrating tablet 5 mg  5 mg Oral QHS Saramma Eappen, MD   5 mg at 01/11/14 2203  . ondansetron (ZOFRAN-ODT) disintegrating tablet 4 mg  4 mg Oral Q6H PRN Shuvon Rankin, NP   4 mg at 01/09/14 1730  . pantoprazole (PROTONIX) EC tablet 20 mg  20 mg Oral BID AC Saramma Eappen, MD   20 mg at 01/12/14 0640  . potassium chloride SA (K-DUR,KLOR-CON) CR tablet 20 mEq  20 mEq Oral Daily Saramma Eappen, MD   20 mEq at 01/12/14 0832  . QUEtiapine (SEROQUEL XR) 24 hr tablet 200 mg  200 mg Oral QHS Saramma Eappen, MD   200 mg at 01/11/14 2203  . thiamine (B-1) injection 100 mg  100 mg Intramuscular Once Shuvon Rankin, NP      . thiamine (VITAMIN B-1) tablet 100 mg  100 mg Oral Daily Shuvon Rankin, NP   100 mg at 01/12/14 0832  . traZODone (DESYREL) tablet 100 mg  100 mg Oral QHS PRN Saramma Eappen, MD   100 mg at 01/11/14 2203    Lab Results:  Results for orders placed during the hospital encounter of 01/09/14 (from the past 48 hour(s))  HEMOGLOBIN A1C     Status: None   Collection Time    01/11/14  6:08 AM      Result Value Ref Range   Hemoglobin A1C 5.6  <5.7 %   Comment: (NOTE)                                                                               According to the ADA Clinical Practice Recommendations for 2011, when     HbA1c is used as a screening test:      >=6.5%   Diagnostic of Diabetes Mellitus               (if abnormal result is confirmed)     5.7-6.4%   Increased risk of developing Diabetes Mellitus     References:Diagnosis and Classification of Diabetes Mellitus,Diabetes     Care,2011,34(Suppl 1):S62-S69 and Standards of Medical Care in               Diabetes - 2011,Diabetes Care,2011,34 (Suppl 1):S11-S61.   Mean Plasma Glucose 114  <117 mg/dL   Comment:  Performed at [facility]  LIPID PANEL     Status: Abnormal   Collection Time    01/11/14  6:08 AM      Result Value Ref Range   Cholesterol 120  0 - 200 mg/dL   Triglycerides 150 (*) <150 mg/dL   HDL 51  >39 mg/dL   Total CHOL/HDL Ratio 2.4     VLDL 30  0 - 40 mg/dL   LDL Cholesterol 39  0 - 99 mg/dL   Comment:            Total Cholesterol/HDL:CHD Risk     Coronary Heart Disease Risk Table                         Men   Women      1/2 Average Risk   3.4   3.3      Average Risk       5.0   4.4      2 X Average Risk   9.6   7.1      3 X Average Risk  23.4   11.0                Use the calculated Patient Ratio     above and the CHD Risk Table     to determine the patient's CHD Risk.                ATP III CLASSIFICATION (LDL):      <100     mg/dL   Optimal      100-129  mg/dL   Near or Above                        Optimal      130-159  mg/dL   Borderline      160-189  mg/dL   High      >190     mg/dL   Very High     Performed at [facility]  TSH     Status: None   Collection Time    01/11/14  6:08 AM      Result Value Ref Range   TSH 1.660  0.350 - 4.500 uIU/mL   Comment: Performed at  Hospital  COMPREHENSIVE METABOLIC PANEL     Status: Abnormal   Collection Time    01/11/14  6:08 AM      Result Value Ref Range   Sodium 143  137 - 147 mEq/L   Potassium 4.1  3.7 - 5.3 mEq/L   Chloride 105  96 - 112 mEq/L   CO2 24  19 - 32 mEq/L   Glucose, Bld 125 (*) 70 - 99 mg/dL   BUN 11  6 - 23 mg/dL   Creatinine, Ser 0.83  0.50 - 1.35 mg/dL   Calcium 8.7  8.4 - 10.5 mg/dL   Total Protein 6.5  6.0 - 8.3 g/dL   Albumin 2.9 (*) 3.5 - 5.2 g/dL   AST 19  0 - 37 U/L   ALT 23  0 - 53 U/L   Alkaline Phosphatase 99  39 - 117 U/L   Total Bilirubin <0.2 (*) 0.3 - 1.2 mg/dL   GFR calc non Af Amer >90  >90 mL/min   GFR calc Af Amer >90  >90 mL/min     Comment: (NOTE)     The eGFR has been calculated using the CKD EPI equation.     This calculation has not been  validated in all clinical situations.     eGFR's persistently <90 mL/min signify possible Chronic Kidney     Disease.   Anion gap 14  5 - 15   Comment: Performed at [facility]    Physical Findings: AIMS: Facial and Oral Movements Muscles of Facial Expression: None, normal Lips and Perioral Area: None, normal Jaw: None, normal Tongue: None, normal,Extremity Movements Upper (arms, wrists, hands, fingers): None, normal Lower (legs, knees, ankles, toes): None, normal, Trunk Movements Neck, shoulders, hips: None, normal, Overall Severity Severity of abnormal movements (highest score from questions above): None, normal Incapacitation due to abnormal movements: None, normal Patient's awareness of abnormal movements (rate only patient's report): No Awareness, Dental Status Current problems with teeth and/or dentures?: No Does patient usually wear dentures?: No  CIWA:  CIWA-Ar Total: 1 COWS:     Assessment: Patient remains irritable, dysphoric, but not agitated or threatening. Reports vivid visual hallucinations of a giraffe coming into his room, but does not appear internally preoccupied or bizarre, and this writer suspects some fabrication in this report.  Appears calm, alert, attentive, and no current presentation to suggest DTs.  Tolerating medications well. Treatment Plan Summary: Daily contact with patient to assess and evaluate symptoms and progress in treatment Medication management  Plan: Will continue patient on CIWA as well as Librium protocol for alcohol withdrawal symptoms. Will continue  Seroquel XR to 200 mg po qhs , which is replacing Zyprexa  Will continue Depakote DR 500 mg po bid  Patient encouraged to follow up at HIV clinic post discharge- he has established outpatient care with ID specialist there Valproic Acid level tomorrow in AM    Medical Decision Making Problem Points:  Established problem, stable/improving (1) and Review of last therapy  session (1) Data Points:  Review or order clinical lab tests (1) Review of medication regiment & side effects (2)  I certify that inpatient services furnished can reasonably be expected to improve the patient's condition.   Neita Garnet MD 01/12/2014, 2:27 PM

## 2014-01-12 NOTE — [facility] Group Notes (Signed)
[place] Group Notes:  (Nursing/MHT/Case Management/Adjunct)  Date:  01/12/2014  Time:  6:08 PM  Type of Therapy:  Nurse Education  Participation Level:  Did Not Attend  Participation Quality:  did not attend  Affect:  did not attend  Cognitive:  did not attend  Insight:  None  Engagement in Group:  did not attend  Modes of Intervention:  did not attend  Summary of Progress/Problems:pt asleep in his bed  Karel Jarvis 01/12/2014, 6:08 PM

## 2014-01-12 NOTE — [facility] Group Notes (Signed)
[place] LCSW Group Therapy  01/12/2014 5:48 PM  Type of Therapy:  Group Therapy  Participation Level:  Active  Participation Quality:  Appropriate  Affect:  Appropriate  Cognitive:  Appropriate  Insight:  Engaged  Engagement in Therapy:  Engaged  Modes of Intervention:  Discussion  Summary of Progress/Problems:Group processing today was about identifying key lessons learned in working through mental health/wellness. With these examples processed from the entire group, using the lessons learned to address challenges that participant needs to overcome. Patient was able to make direct application of group topic into his life.  Christene Lye MSW, LCSW  Lyla Glassing 01/12/2014, 5:48 PM

## 2014-01-12 NOTE — Progress Notes (Signed)
Patient ID: John [name], male   DOB: Jan 02, 1971, 43 y.o.   MRN: [id number] D. Pt presents with irritable mood, affect congruent. Pt presents disheveled, poor hygiene.[name] reports continued withdrawal symptoms stating '' oh yeah I'm seeing green men, and tall giraffes they stick there head in the door. Pt has been isolative to his room throughout most of shift. He has not attended unit programming throughout shift, and refused to complete self inventory. A. Medications given as ordered. Support and encouragement provided. R. Pt is safe. No further voiced concerns at this time.

## 2014-01-12 NOTE — Progress Notes (Signed)
Adult Psychoeducational Group Note  Date:  01/12/2014 Time:  9:31 PM  Group Topic/Focus:  Wrap-Up Group:   The focus of this group is to help patients review their daily goal of treatment and discuss progress on daily workbooks.  Participation Level:  Minimal  Participation Quality:  Resistant  Affect:  Flat  Cognitive:  Lacking  Insight: Lacking  Engagement in Group:  Lacking  Modes of Intervention:  Discussion  Additional Comments: The patient expressed that his day was all right.The patient also said that he learn that he need to work on holding his composer.  [name] 01/12/2014, 9:31 PM

## 2014-01-12 NOTE — BHH Group Notes (Signed)
[facility] Group Notes:  (Nursing/MHT/Case Management/Adjunct)  Date:  01/12/2014  Time:  6:10 PM  Type of Therapy:  Nurse Education  Participation Level:  Did Not Attend  Participation Quality:  did not attend  Affect:  did not attend  Cognitive:  did not attend  Insight:  None  Engagement in Group:  did not attend  Modes of Intervention:  did not attend  Summary of Progress/Problems:pt asleep in his bed    Karel Jarvis 01/12/2014, 6:10 PM

## 2014-01-13 LAB — VALPROIC ACID LEVEL: Valproic Acid Lvl: 51.3 ug/mL (ref 50.0–100.0)

## 2014-01-13 MED ORDER — QUETIAPINE FUMARATE ER 300 MG PO TB24
300.0000 mg | ORAL_TABLET | Freq: Every day | ORAL | Status: DC
  Administered 2014-01-13: 300 mg via ORAL
  Filled 2014-01-13 (×3): qty 1

## 2014-01-13 MED ORDER — MAGNESIUM CITRATE PO SOLN
1.0000 | Freq: Once | ORAL | Status: AC
  Administered 2014-01-13: 1 via ORAL

## 2014-01-13 NOTE — BHH Group Notes (Signed)
[facility] Group Notes:  (Nursing/MHT/Case Management/Adjunct)  Date:  01/13/2014  Time:  1:24 PM    Type of Therapy:  Psychoeducational Skills-Healthy Support Systems   Participation Level:  Active  Participation Quality:  Appropriate  Affect:  Appropriate  Cognitive:  Appropriate  Insight:  Appropriate  Engagement in Group:  Engaged  Modes of Intervention:  Discussion  Summary of Progress/Problems: Pt did attend healthy support systems group.   Benancio Deeds Shanta 01/13/2014, 1:24 PM

## 2014-01-13 NOTE — Progress Notes (Signed)
Patient ID: John [name], male   DOB: [birth year], 43 y.o.   MRN: [id number]  D: pt didn't go into many details with the Probation officer. Informed writer that his readmission is due to multiple deaths in his family. Stated that his brother died the same day he was discharged from [facility]. Stated it delayed him attending meeting for apprx 1 week. Stated if he didn't get some help he would "blow".   A:  Support and encouragement was offered. 15 min checks continued for safety.  R: Pt remains safe.

## 2014-01-13 NOTE — Progress Notes (Signed)
Patient ID: John [name], male   DOB: Jul 31, 1970, 43 y.o.   MRN: [id number] D. Pt presents with irritable mood, affect congruent. Ion continues to report feeling ''angry and ill cause I want my seroquel and I didn't sleep '' He states he is continuing to have vivid hallucinations of ''green men got out of a spaceship that landed on my bed. Then stuff coming in and out of my window'' He does not appear to be responding to any internal stimuli. He denies any withdrawal symptoms. Pt did complain of constipation. A. Discussed above with Dr. Parke Poisson. Orders received for mag citrate. Support encouragement provided. R. Patient is safe, has been isolative to room throughout most of shift. In no acute distress at this time. Will continue to monitor q 15 minutes for safety.

## 2014-01-13 NOTE — BHH Group Notes (Signed)
[facility] LCSW Group Therapy 01/13/2014 1:15pm  Pt did not attend.    Peri Maris, [place] 01/13/2014 2:37 PM

## 2014-01-13 NOTE — Progress Notes (Addendum)
Patient ID: John [name], male   DOB: [birth year], 43 y.o.   MRN: [id number] [facility] MD Progress Note  01/13/2014 5:11 PM John [name]  MRN:  [id number] Subjective:  Patient states " I am about the same" . Complains of constipation. Complains of insomnia. Objective:  I have discussed case with Nursing Staff and met with patient along with RN. Patient is more visible on unit, and has gone to some groups. No disruptive behaviors on unit, although tends to be irritable and to keep to self. He continues to report bizarre visual hallucinations, to include reporting that " little green men landed on my bed", " I saw a pink elephant". The description of these hallucinations, the fact that he does not seem thought disordered or internally preoccupied, the lack of agitation or concern about said hallucinations, suggests some element of fabrication, and RNs concur. He is currently not presenting with any significant withdrawal symptoms, and has no significant distal tremors, no diaphoresis, no agitation. Vitals are stable. Denies medication side effects. Valproic Acid low therapeutic- 51.3     Diagnosis:  Polysubstance Dependence, to include cocaine, cannabis, alcohol. Bipolar Disorder by history    Total Time spent with patient: 20 minutes   ADL's:  Poor   Sleep:  States sleep " not good", chart notes indicate slept 7(+) hours   Appetite:  Good    Psychiatric Specialty Exam: Physical Exam  ROS  Blood pressure 139/82, pulse 88, temperature 97.8 F (36.6 C), temperature source Oral, resp. rate 18, height 5' 7.5" (1.715 m), weight 90.719 kg (200 lb), SpO2 98 %.Body mass index is 30.84 kg/(m^2).  General Appearance: Fairly Groomed  Engineer, water::  Good  Speech:  Normal Rate  Volume:  Normal  Mood:  Irritable  Affect:  still irritable  Thought Process:  Linear- no gross thought disorder noted at this time  Orientation:  Full (Time, Place, and Person)  Thought Content:  endorses vivid visual  hallucinations, but does not appear internally preoccupied or bizarre    Suicidal Thoughts:  No  Homicidal Thoughts:  No- denies any thoughts of hurting self or anyone else at this time  Memory:  Fair   Judgement:  Impaired  Insight:  Fair  Psychomotor Activity:  Normal- no agitation at this time  Concentration:  Fair  Recall:  [employer] of Knowledge:Fair  Language: Fair  Akathisia:  No  Handed:  Right  AIMS (if indicated):     Assets:  Communication Skills Desire for Improvement  Sleep:  Number of Hours: 7.75   Musculoskeletal: Strength & Muscle Tone: within normal limits Gait & Station: normal Patient leans: N/A  Current Medications: Current Facility-Administered Medications  Medication Dose Route Frequency Provider Last Rate Last Dose  . acetaminophen (TYLENOL) tablet 650 mg  650 mg Oral Q6H PRN Shuvon Rankin, NP      . albuterol (PROVENTIL HFA;VENTOLIN HFA) 108 (90 BASE) MCG/ACT inhaler 2 puff  2 puff Inhalation Q4H PRN Shuvon Rankin, NP   2 puff at 01/13/14 0620  . alum & mag hydroxide-simeth (MAALOX/MYLANTA) 200-200-20 MG/5ML suspension 30 mL  30 mL Oral Q4H PRN Shuvon Rankin, NP   30 mL at 01/12/14 1633  . benztropine (COGENTIN) tablet 0.5 mg  0.5 mg Oral BID Shuvon Rankin, NP   0.5 mg at 01/13/14 1543  . haloperidol (HALDOL) tablet 5 mg  5 mg Oral Q6H PRN Ursula Alert, MD       And  . benztropine (COGENTIN) tablet 1 mg  1 mg  Oral Q6H PRN Ursula Alert, MD      . divalproex (DEPAKOTE) DR tablet 500 mg  500 mg Oral Q12H Shuvon Rankin, NP   500 mg at 01/13/14 0738  . docusate sodium (COLACE) capsule 100 mg  100 mg Oral BID Ursula Alert, MD   100 mg at 01/13/14 1543  . magnesium hydroxide (MILK OF MAGNESIA) suspension 30 mL  30 mL Oral Daily PRN Shuvon Rankin, NP      . multivitamin with minerals tablet 1 tablet  1 tablet Oral Daily Shuvon Rankin, NP   1 tablet at 01/13/14 0739  . pantoprazole (PROTONIX) EC tablet 20 mg  20 mg Oral BID AC Saramma Eappen, MD   20 mg at  01/13/14 1543  . potassium chloride SA (K-DUR,KLOR-CON) CR tablet 20 mEq  20 mEq Oral Daily Ursula Alert, MD   20 mEq at 01/13/14 0738  . QUEtiapine (SEROQUEL XR) 24 hr tablet 200 mg  200 mg Oral QHS Ursula Alert, MD   200 mg at 01/12/14 2132  . thiamine (B-1) injection 100 mg  100 mg Intramuscular Once Shuvon Rankin, NP   100 mg at 01/09/14 1600  . thiamine (VITAMIN B-1) tablet 100 mg  100 mg Oral Daily Shuvon Rankin, NP   100 mg at 01/13/14 0738  . traZODone (DESYREL) tablet 100 mg  100 mg Oral QHS PRN Ursula Alert, MD   100 mg at 01/11/14 2203    Lab Results:  Results for orders placed or performed during the hospital encounter of 01/09/14 (from the past 48 hour(s))  Valproic acid level     Status: None   Collection Time: 01/13/14  6:30 AM  Result Value Ref Range   Valproic Acid Lvl 51.3 50.0 - 100.0 ug/mL    Comment: Performed at [facility]    Physical Findings: AIMS: Facial and Oral Movements Muscles of Facial Expression: None, normal Lips and Perioral Area: None, normal Jaw: None, normal Tongue: None, normal,Extremity Movements Upper (arms, wrists, hands, fingers): None, normal Lower (legs, knees, ankles, toes): None, normal, Trunk Movements Neck, shoulders, hips: None, normal, Overall Severity Severity of abnormal movements (highest score from questions above): None, normal Incapacitation due to abnormal movements: None, normal Patient's awareness of abnormal movements (rate only patient's report): No Awareness, Dental Status Current problems with teeth and/or dentures?: No Does patient usually wear dentures?: No  CIWA:  CIWA-Ar Total: 2 COWS:     Assessment: Patient remains irritable, but today somewhat better and is a little more visible on the unit. No agitated or disruptive behaviors noted. He continues to describe highly elaborate visual hallucinations, but does not appear internally preoccupied or overtly psychotic, nor does he appear to be in any  significant alcohol withdrawal.  Tolerating medications well. Treatment Plan Summary: Daily contact with patient to assess and evaluate symptoms and progress in treatment Medication management  Plan: Will continue patient on CIWA as well as Librium protocol for alcohol withdrawal symptoms. Will increase   Seroquel XR to 300 mg po qhs Will continue Depakote DR 500 mg po bid  Patient encouraged to follow up at HIV clinic post discharge- he has established outpatient care with ID specialist there     Medical Decision Making Problem Points:  Established problem, stable/improving (1) and Review of last therapy session (1) Data Points:  Review of medication regiment & side effects (2)  I certify that inpatient services furnished can reasonably be expected to improve the patient's condition.   Neita Garnet MD 01/13/2014,  5:11 PM

## 2014-01-13 NOTE — BHH Group Notes (Signed)
[facility] Group Notes:  (Nursing/MHT/Case Management/Adjunct)  Date:  01/13/2014  Time:  1:23 PM  Type of Therapy:  Psychoeducational Skills-Pt Self Inventory Group   Participation Level:  Active  Participation Quality:  Appropriate  Affect:  Appropriate  Cognitive:  Appropriate  Insight:  Appropriate  Engagement in Group:  Engaged  Modes of Intervention:  Discussion  Summary of Progress/Problems: Pt did attend self inventory group.     Benancio Deeds Shanta 01/13/2014, 1:23 PM

## 2014-01-14 DIAGNOSIS — F319 Bipolar disorder, unspecified: Secondary | ICD-10-CM

## 2014-01-14 DIAGNOSIS — F1994 Other psychoactive substance use, unspecified with psychoactive substance-induced mood disorder: Secondary | ICD-10-CM | POA: Insufficient documentation

## 2014-01-14 DIAGNOSIS — F1490 Cocaine use, unspecified, uncomplicated: Secondary | ICD-10-CM

## 2014-01-14 DIAGNOSIS — F19929 Other psychoactive substance use, unspecified with intoxication, unspecified: Secondary | ICD-10-CM

## 2014-01-14 DIAGNOSIS — F1020 Alcohol dependence, uncomplicated: Secondary | ICD-10-CM | POA: Insufficient documentation

## 2014-01-14 DIAGNOSIS — F10232 Alcohol dependence with withdrawal with perceptual disturbance: Secondary | ICD-10-CM

## 2014-01-14 DIAGNOSIS — Z91199 Patient's noncompliance with other medical treatment and regimen due to unspecified reason: Secondary | ICD-10-CM | POA: Insufficient documentation

## 2014-01-14 DIAGNOSIS — F17200 Nicotine dependence, unspecified, uncomplicated: Secondary | ICD-10-CM | POA: Insufficient documentation

## 2014-01-14 DIAGNOSIS — F1220 Cannabis dependence, uncomplicated: Secondary | ICD-10-CM | POA: Insufficient documentation

## 2014-01-14 DIAGNOSIS — Z9119 Patient's noncompliance with other medical treatment and regimen: Secondary | ICD-10-CM | POA: Insufficient documentation

## 2014-01-14 DIAGNOSIS — F1420 Cocaine dependence, uncomplicated: Secondary | ICD-10-CM | POA: Insufficient documentation

## 2014-01-14 MED ORDER — QUETIAPINE FUMARATE ER 400 MG PO TB24
400.0000 mg | ORAL_TABLET | Freq: Every day | ORAL | Status: DC
  Administered 2014-01-14: 400 mg via ORAL
  Filled 2014-01-14 (×2): qty 2
  Filled 2014-01-14 (×2): qty 1
  Filled 2014-01-14: qty 14

## 2014-01-14 MED ORDER — GABAPENTIN 100 MG PO CAPS
200.0000 mg | ORAL_CAPSULE | Freq: Three times a day (TID) | ORAL | Status: DC
  Administered 2014-01-14 – 2014-01-15 (×3): 200 mg via ORAL
  Filled 2014-01-14 (×4): qty 2
  Filled 2014-01-14 (×3): qty 84
  Filled 2014-01-14: qty 2

## 2014-01-14 NOTE — Progress Notes (Signed)
Patient ID: John [name], male   DOB: Aug 02, 1970, 43 y.o.   MRN: [id number] [facility] MD Progress Note  01/14/2014 2:13 PM John [name]  MRN:  [id number] Subjective:  Patient states " I feel my symptoms are worsening ,I see stuff and I hear voices ". Objective:  I have discussed case with Nursing Staff. Pt continues to endorse bizarre VH of seeing 'green men" ,unicorns as well as pink elephants '. Pt also reports hearing voices ,however as previously noted in Dr.Cobbo's notes ,patient does not appear to be thought disordered or internally preoccupied ,and is seen more on the unit ,attending group activities inspite of his above said "worsening symptoms". Hence it does suggest some element of "fabrication" . Patient also reports sleep difficulties, however per EHR -sleep -6.25 hrs (after 11 pm ). He is currently not presenting with any significant withdrawal symptoms, and has no significant distal tremors, no diaphoresis, no agitation. Vitals are stable. Denies medication side effects.   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 rate 16, height 5' 7.5" (1.715 m), weight 90.719 kg (200 lb), SpO2 98 %.Body mass index is 30.84 kg/(m^2).  General Appearance: Fairly  Groomed  Engineer, water::  Good  Speech:  Normal Rate  Volume:  Normal  Mood:  Irritable  Affect:  still irritable  Thought Process:  Linear- no gross thought disorder noted at this time  Orientation:  Full (Time, Place, and Person)  Thought Content:  endorses vivid visual hallucinations, but does not appear internally preoccupied or bizarre    Suicidal Thoughts:  No  Homicidal Thoughts:  No- denies any thoughts of hurting self or anyone else at this time  Memory:  Fair   Judgement:  Impaired  Insight:  Fair  Psychomotor Activity:  Normal- no agitation at this time  Concentration:  Fair  Recall:  [employer] of Knowledge:Fair  Language: Fair  Akathisia:  No  Handed:  Right  AIMS (if indicated):     Assets:  Communication Skills Desire for Improvement  Sleep:  Number of Hours: 6.25   Musculoskeletal: Strength & Muscle Tone: within normal limits Gait & Station: normal Patient leans: N/A  Current Medications: Current Facility-Administered Medications  Medication Dose Route Frequency Provider Last Rate Last Dose  . acetaminophen (TYLENOL) tablet 650 mg  650 mg Oral Q6H PRN Shuvon Rankin, NP   650 mg at 01/14/14 0813  . albuterol (PROVENTIL HFA;VENTOLIN HFA) 108 (90 BASE) MCG/ACT inhaler 2 puff  2 puff Inhalation Q4H PRN Shuvon Rankin, NP   2 puff at 01/14/14 1137  . alum & mag hydroxide-simeth (MAALOX/MYLANTA) 200-200-20 MG/5ML suspension 30 mL  30 mL Oral Q4H PRN Shuvon Rankin, NP   30 mL at 01/12/14 1633  . benztropine (COGENTIN) tablet 0.5 mg  0.5 mg Oral BID  Shuvon Rankin, NP   0.5 mg at 01/14/14 2130  . haloperidol (HALDOL) tablet 5 mg  5 mg Oral Q6H PRN Ursula Alert, MD       And  . benztropine (COGENTIN) tablet 1 mg  1 mg Oral Q6H PRN Kelilah Hebard, MD      . divalproex (DEPAKOTE) DR tablet 500 mg  500 mg Oral Q12H Shuvon Rankin, NP   500 mg at 01/14/14 0811  . docusate sodium (COLACE) capsule 100 mg  100 mg Oral BID Ursula Alert, MD   100 mg at 01/14/14 0810  .  gabapentin (NEURONTIN) capsule 200 mg  200 mg Oral TID Ursula Alert, MD      . magnesium hydroxide (MILK OF MAGNESIA) suspension 30 mL  30 mL Oral Daily PRN Shuvon Rankin, NP      . multivitamin with minerals tablet 1 tablet  1 tablet Oral Daily Shuvon Rankin, NP   1 tablet at 01/14/14 0811  . pantoprazole (PROTONIX) EC tablet 20 mg  20 mg Oral BID AC Ursula Alert, MD   20 mg at 01/14/14 0637  . potassium chloride SA (K-DUR,KLOR-CON) CR tablet 20 mEq  20 mEq Oral Daily Ursula Alert, MD   20 mEq at 01/14/14 0810  . QUEtiapine (SEROQUEL XR) 24 hr tablet 400 mg  400 mg Oral QHS [name], MD      . thiamine (B-1) injection 100 mg  100 mg Intramuscular Once Shuvon Rankin, NP   100 mg at 01/09/14 1600  . thiamine (VITAMIN B-1) tablet 100 mg  100 mg Oral Daily Shuvon Rankin, NP   100 mg at 01/14/14 0811  . traZODone (DESYREL) tablet 100 mg  100 mg Oral QHS PRN Ursula Alert, MD   100 mg at 01/13/14 2104    Lab Results:  Results for orders placed or performed during the hospital encounter of 01/09/14 (from the past 48 hour(s))  Valproic acid level     Status: None   Collection Time: 01/13/14  6:30 AM  Result Value Ref Range   Valproic Acid Lvl 51.3 50.0 - 100.0 ug/mL    Comment: Performed at [facility]    Physical Findings: AIMS: Facial and Oral Movements Muscles of Facial Expression: None, normal Lips and Perioral Area: None, normal Jaw: None, normal Tongue: None, normal,Extremity Movements Upper (arms, wrists, hands, fingers): None, normal Lower (legs, knees, ankles, toes): None, normal, Trunk Movements Neck, shoulders, hips: None, normal, Overall Severity Severity of abnormal movements (highest score from questions above): None, normal Incapacitation due to abnormal movements: None, normal Patient's awareness of abnormal movements (rate only patient's report): No Awareness, Dental Status Current problems with teeth and/or dentures?: No Does patient usually wear  dentures?: No  CIWA:  CIWA-Ar Total: 2 COWS:     Assessment: Patient remains irritable, but today somewhat better and is a little more visible on the unit. No agitated or disruptive behaviors noted. He continues to describe highly elaborate visual hallucinations, but does not appear internally preoccupied or overtly psychotic, nor does he appear to be in any significant alcohol withdrawal.  Tolerating medications well. Treatment Plan Summary: Daily contact with patient to assess and evaluate symptoms and progress in treatment Medication management  Plan: Will continue patient on CIWA protocol . Will increase   Seroquel XR to 400 mg po qhs Will continue Depakote DR 500 mg po bid . Will add Gabapentin 200 mg po tid for anxiety sx (per pt report). Patient encouraged to follow up at HIV clinic  post discharge- he has established outpatient care with ID specialist there. Pt declined [facility] referrel - pt wants to res[facility]dule [facility] appointment for tomorrow. CSW will work on disposition.      Medical Decision Making Problem Points:  Established problem, stable/improving (1) and Review of last therapy session (1) Data Points:  Review of medication regiment & side effects (2)  I certify that inpatient services furnished can reasonably be expected to improve the patient's condition.   Tenesha Garza MD 01/14/2014, 2:13 PM

## 2014-01-14 NOTE — Progress Notes (Signed)
Patient ID: John [name], male   DOB: 10-29-1970, 43 y.o.   MRN: [id number]   D: Pt became excited when he was told the writer was his nurse for the night. Pt smiled and stated, "good, please tell me that you have my sleep meds." Writer reminded the pt that sleep meds would not be given before 2100. Pt was slightly upset with the news and walked away while the writer was attempting to assess.  During med pass, pt acknowledged that the pt was upset earlier in the shift. Pt informed the writer that he wasn't feeling well. Stated, "when I'm feeling bad I don't like to be bothered." Stated, "that's why I asked for a room by myself. Mess around and my bipolar go come out and yall won't like me". Pt also requested an Ensure but was informed by the writer that he didn't have an order and ensures were for those that need add'l help with nutrition.   A:  Support and encouragement was offered. 15 min checks continued for safety.  R: Pt remains safe.
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
## 2014-01-14 NOTE — Progress Notes (Addendum)
Patient continues to endorse VH seeing "pink elephants, giraffes and purple dinosaurs" however this is incongruent with presentation. Patient is flat and depressed however no evidence of internal stimuli observed or displayed. Endorses passive SI but verbally contracts for safety. Medicated per orders. Reassurance and encouragement provided. Discussed care with MD. Patient states he has no goal even when this writer attempts to encourage and brainstorm. Pt remains safe at present. Medicated for SOB and headache with some relief. Jamie Kato

## 2014-01-14 NOTE — Progress Notes (Signed)
Writer observed patient lying in his bed sleeping but was aroused after calling his name several times. He did not attend group this evening and reports that he still has auditory hallucinations of voices telling him to kill, kill. He reports visual hallucinations of pink elephants and green men. Patient sits in the dayroom briefly and watches tv and ate a snack. He voiced no other complaints. Support and encouragement given, safety maintained on unit with 15 min checks.

## 2014-01-14 NOTE — Plan of Care (Signed)
Problem: Ineffective individual coping Goal: STG: Patient will remain free from self harm Outcome: Progressing Patient endorses passive SI but verbally contracts for safety. No acts of self harm on unit.  Problem: Alteration in mood & ability to function due to Goal: STG-Patient will report withdrawal symptoms Outcome: Completed/Met Date Met:  01/14/14 Patient is denying all signs and symptoms of withdrawal.

## 2014-01-14 NOTE — BHH Group Notes (Signed)
[facility] LCSW Aftercare Discharge Planning Group Note   01/14/2014 12:45 PM  Participation Quality:  Minimal  Mood/Affect:  Flat  Depression Rating:  9  Anxiety Rating:  8  Thoughts of Suicide:  Yes Will you contract for safety?   Yes  Current AVH:  Yes  Plan for Discharge/Comments:  John [name] is positive for all symptoms today.  He presents as helpless, hopeless.  Told him he is scheduled for screening at [facility] tomorrow.  "I'm not ready.  Please change my appointment until next month." C/O lack of sleep.  Transportation Means:   Supports:  Roque Lias B

## 2014-01-14 NOTE — Progress Notes (Signed)
Psychoeducational Group Note  Date:  01/13/2014 Time:  2350  Group Topic/Focus:  Wrap-Up Group:   The focus of this group is to help patients review their daily goal of treatment and discuss progress on daily workbooks.  Participation Level: Did Not Attend  Participation Quality:  Not Applicable  Affect:  Not Applicable  Cognitive:  Not Applicable  Insight:  Not Applicable  Engagement in Group: Not Applicable  Additional Comments:  The patient did not attend group this evening and remained in his bedroom.   Rasean Joos S 01/14/2014, 12:51 AM

## 2014-01-15 ENCOUNTER — Ambulatory Visit: Payer: Self-pay | Admitting: Internal Medicine

## 2014-01-15 MED ORDER — DARUNAVIR ETHANOLATE 800 MG PO TABS: 800.0000 mg | ORAL_TABLET | Freq: Every day | ORAL | Status: DC

## 2014-01-15 MED ORDER — GABAPENTIN 100 MG PO CAPS: 200.0000 mg | ORAL_CAPSULE | Freq: Three times a day (TID) | ORAL | Status: DC

## 2014-01-15 MED ORDER — ELVITEG-COBIC-EMTRICIT-TENOFDF 150-150-200-300 MG PO TABS: 1.0000 | ORAL_TABLET | Freq: Every day | ORAL | Status: DC

## 2014-01-15 MED ORDER — QUETIAPINE FUMARATE ER 400 MG PO TB24: 400.0000 mg | ORAL_TABLET | Freq: Every day | ORAL | Status: DC

## 2014-01-15 MED ORDER — PANTOPRAZOLE SODIUM 20 MG PO TBEC: 20.0000 mg | DELAYED_RELEASE_TABLET | Freq: Two times a day (BID) | ORAL | Status: DC

## 2014-01-15 MED ORDER — BENZTROPINE MESYLATE 0.5 MG PO TABS: 0.5000 mg | ORAL_TABLET | Freq: Two times a day (BID) | ORAL | Status: DC

## 2014-01-15 MED ORDER — DIVALPROEX SODIUM 500 MG PO DR TAB: 500.0000 mg | DELAYED_RELEASE_TABLET | Freq: Two times a day (BID) | ORAL | Status: DC

## 2014-01-15 MED ORDER — ADULT MULTIVITAMIN W/MINERALS CH: 1.0000 | ORAL_TABLET | Freq: Every day | ORAL | Status: DC

## 2014-01-15 NOTE — Progress Notes (Signed)
[facility] Adult Case Management Discharge Plan :  Will you be returning to the same living situation after discharge: Yes,  home At discharge, do you have transportation home?:Yes,  bus pass Do you have the ability to pay for your medications:Yes,  mental health  Release of information consent forms completed and in the chart;  Patient's signature needed at discharge.  Patient to Follow up at: Follow-up Information    Please follow up.   Why:  Go to the walk-in clinic M-F between 8 and 10 AM for your hospital follow up appointment   Contact information:   [place] [phone]      Patient denies SI/HI:   Yes,  yes    Safety Planning and Suicide Prevention discussed:  Yes,  yes  Trish Mage 01/15/2014, 10:24 AM

## 2014-01-15 NOTE — Plan of Care (Signed)
Problem: Ineffective individual coping Goal: STG: Patient will remain free from self harm Outcome: Progressing Pt has not attempting to harm himself or others  Problem: Diagnosis: Increased Risk For Suicide Attempt Goal: STG-Patient Will Report Suicidal Feelings to Staff Outcome: Progressing Pt denied feelings of self harm and contracts for safety

## 2014-01-15 NOTE — Discharge Summary (Signed)
Physician Discharge Summary Note  Patient:  John [name] is an 43 y.o., male MRN:  [id number] DOB:  1970/06/28 Patient phone:  [phone] (home)  Patient address:   [place] [address],  Total Time spent with patient: 30 minutes  Date of Admission:  01/09/2014 Date of Discharge: 01/15/14  Reason for Admission:  Depression, Psychosis  Discharge Diagnoses: Active Problems:   MDD (major depressive disorder), recurrent, severe, with psychosis   Substance or medication-induced bipolar and related disorder with onset during intoxication   Cannabis use disorder, severe, dependence   Alcohol use disorder, severe, dependence   Cocaine use disorder, severe, dependence   Noncompliance with treatment   Tobacco use disorder, moderate, dependence   Psychiatric Specialty Exam: Physical Exam  Review of Systems  Constitutional: Negative.   HENT: Negative.   Eyes: Negative.   Respiratory: Negative.   Cardiovascular: Negative.   Gastrointestinal: Negative.   Genitourinary: Negative.   Musculoskeletal: Negative.   Skin: Negative.   Neurological: Negative.   Endo/Heme/Allergies: Negative.   Psychiatric/Behavioral: Positive for depression (Stable with treatment ) and hallucinations (Stable with treatment ). The patient is nervous/anxious (Stable with treatment ).     Blood pressure 112/95, pulse 129, temperature 97.5 F (36.4 C), temperature source Oral, resp. rate 16, height 5' 7.5" (1.715 m), weight 90.719 kg (200 lb), SpO2 98 %.Body mass index is 30.84 kg/(m^2).  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 On presentation he reported SI with a plan to stab himself in the neck. He also endorsed worsening depressive symptoms, feeling hopeless, worthless, and says that he believes nobody loves or care about him. Patient also reported command auditory hallucinations and says that he has been seeing giraffe, pink elephants and purple dinosaurs. Pt seen this AM ,appears to be very irritable and angry. Pt endorses anxiety ,depression as well as SI with a plan to cut self and reports he would feel better if he has a sitter by his side.Pt also endorses HI ,with no specific plans, "wants to hurt anybody ". Pt reports drinking heavily since the past few days ,reports he started drinking in order to drown the [facility]. He continues to endorse AH saying "bad things".Pt reports using cannabis 3 weeks ago. Pt reports taking his medications regularly but per ED notes patient stated that "his aunt has his normal meds, so has been without them for 'awhile ".Patient was recently admitted to [facility] from 11/15/2013 -11/26/2013 for similar presentation.Patient also has a  hx of HIV ,noncompliant on medications,pt was referred to ID clinic last admission.He reports that he did keep the appointment and he got a letter asking him to pick up medications ,which he never did.         John [name] was admitted to the adult unit. He was evaluated and his symptoms were identified. Medication management was discussed and initiated. His medications were adjusted to address his symptoms. The patient's antipsychotic  medication was changed to Seroquel XR to better address his symptoms of psychosis. He was also started on Depakote DR 500 mg every twelve hours to improve his mood stability. He was oriented to the unit and encouraged to participate in unit programming. Medical problems were identified and treated appropriately. His low potassium level noted on chemistry panel was supplemented during his admission. He was also again encouraged to follow up with the HIV clinic after discharge. Home medication was restarted as needed.        The patient was evaluated each day by a clinical provider to ascertain the patient's response to treatment. Although the patient continued to report experiencing bizarre visual hallucinations of green men, unicorns, and pink elephants he did not present as internally preoccupied during assessments. However, his Seroquel XR was increased to address his reports of worsening symptoms.  Improvement was noted by the patient's report of decreasing symptoms, improved sleep and appetite, affect, medication tolerance, behavior, and participation in unit programming.  He was asked each day to complete a self inventory noting mood, mental status, pain, new symptoms, anxiety and concerns.         He responded well to medication and being in a therapeutic and supportive environment. Positive and appropriate behavior was noted and the patient was motivated for recovery.  The patient worked closely with the treatment team and case manager to develop a discharge plan with appropriate goals. Coping skills, problem solving as well as relaxation therapies were also part of the unit programming.         By the day of discharge he was in much improved condition than upon admission.  Symptoms were reported as significantly decreased or resolved completely. The patient denied SI/HI and voiced no AVH. He was motivated to continue taking medication with a goal of continued improvement in mental health. John [name]  was discharged home with a plan to follow up as noted below. Patient was provided with prescriptions and sample medications.   Consults:  psychiatry  Significant Diagnostic Studies:  Chemistry panel, CBC, Depakote level therapeutic at 51.3, Diabetes panel negative, UDS positive for marijuana   Discharge Vitals:   Blood pressure 112/95, pulse 129, temperature 97.5 F (36.4 C), temperature source Oral, resp. rate 16, height 5' 7.5" (1.715 m), weight 90.719 kg (200 lb), SpO2 98 %. Body mass index is 30.84 kg/(m^2). Lab Results:   Results for orders placed or performed during the hospital encounter of 01/09/14 (from the past 72 hour(s))  Valproic acid level     Status: None   Collection Time: 01/13/14  6:30 AM  Result Value Ref Range   Valproic Acid Lvl 51.3 50.0 - 100.0 ug/mL    Comment: Performed at [facility]    Physical Findings: AIMS: Facial and Oral Movements Muscles of Facial Expression: None, normal Lips and Perioral Area: None, normal Jaw: None, normal Tongue: None, normal,Extremity Movements Upper (arms, wrists, hands, fingers): None, normal Lower (legs, knees, ankles, toes): None, normal, Trunk Movements Neck, shoulders, hips: None, normal, Overall Severity Severity of  abnormal movements (highest score from questions above): None, normal Incapacitation due to abnormal movements: None, normal Patient's awareness of abnormal movements (rate only patient's report): No Awareness, Dental Status Current problems with teeth and/or dentures?: No Does patient usually wear dentures?: No  CIWA:  CIWA-Ar Total: 3 COWS:     Psychiatric Specialty Exam: See Psychiatric Specialty Exam and Suicide Risk Assessment completed by Attending Physician prior to discharge.  Discharge destination:  Home  Is patient on multiple antipsychotic therapies at discharge:  No   Has Patient had three or more failed trials of antipsychotic monotherapy by history:  No  Recommended Plan for  Multiple Antipsychotic Therapies: NA      Discharge Instructions    Discharge instructions    Complete by:  As directed   Please follow up with the HIV clinic post discharge for management of chronic health condition.            Medication List    STOP taking these medications        mirtazapine 7.5 MG tablet  Commonly known as:  REMERON     OLANZapine zydis 10 MG disintegrating tablet  Commonly known as:  ZYPREXA     olanzapine zydis 15 MG disintegrating tablet  Commonly known as:  ZYPREXA     predniSONE 20 MG tablet  Commonly known as:  DELTASONE     traZODone 100 MG tablet  Commonly known as:  DESYREL     venlafaxine XR 75 MG 24 hr capsule  Commonly known as:  EFFEXOR-XR      TAKE these medications      Indication   albuterol 108 (90 BASE) MCG/ACT inhaler  Commonly known as:  PROVENTIL HFA;VENTOLIN HFA  Inhale 2 puffs into the lungs every 4 (four) hours as needed for wheezing or shortness of breath.      benztropine 0.5 MG tablet  Commonly known as:  COGENTIN  Take 1 tablet (0.5 mg total) by mouth 2 (two) times daily.   Indication:  Extrapyramidal Reaction caused by Medications     Darunavir Ethanolate 800 MG tablet  Commonly known as:  PREZISTA  Take 1 tablet (800 mg total) by mouth daily with breakfast.   Indication:  HIV Disease     divalproex 500 MG DR tablet  Commonly known as:  DEPAKOTE  Take 1 tablet (500 mg total) by mouth every 12 (twelve) hours.   Indication:  mood stabilization     elvitegravir-cobicistat-emtricitabine-tenofovir 150-150-200-300 MG Tabs tablet  Commonly known as:  STRIBILD  Take 1 tablet by mouth daily with breakfast.   Indication:  HIV Disease     gabapentin 100 MG capsule  Commonly known as:  NEURONTIN  Take 2 capsules (200 mg total) by mouth 3 (three) times daily.   Indication:  Aggressive Behavior, Agitation, Mood control     hydrOXYzine 25 MG tablet  Commonly known as:  ATARAX/VISTARIL  Take 1 tablet (25 mg total)  by mouth every 6 (six) hours as needed for anxiety.   Indication:  anxiety     multivitamin with minerals Tabs tablet  Take 1 tablet by mouth daily. May purchase over the counter to improve general health.   Indication:  Vitamin Supplementation     pantoprazole 20 MG tablet  Commonly known as:  PROTONIX  Take 1 tablet (20 mg total) by mouth 2 (two) times daily before a meal.   Indication:  Gastroesophageal Reflux Disease     QUEtiapine 400 MG 24 hr tablet  Commonly  known as:  SEROQUEL XR  Take 1 tablet (400 mg total) by mouth at bedtime.   Indication:  Manic-Depression       Follow-up Information    Please follow up.   Why:  Go to the walk-in clinic M-F between 8 and 10 AM for your hospital follow up appointment   Contact information:   [place] [phone]      Follow-up recommendations:   Activity: As tolerated  Diet: Regular  Tests: NA  Other: See below, Depakote level 51.3 as of 01/13/14  Comments:    Take all medications as prescribed. Keep all follow-up appointments as scheduled.  Do not consume alcohol or use illegal drugs while on prescription medications. Report any adverse effects from your medications to your primary care provider promptly.  In the event of recurrent symptoms or worsening symptoms, call 911, a crisis hotline, or go to the nearest emergency department for evaluation.   Total Discharge Time:  Greater than 30 minutes.  SignedElmarie Shiley NP-C 01/15/2014, 10:34 AM

## 2014-01-15 NOTE — [facility] Suicide Risk Assessment (Signed)
[facility] INPATIENT:  Family/Significant Other Suicide Prevention Education  Suicide Prevention Education:  Education Completed; Ms Jacob Moores, aunt, 3 60 has been identified by the patient as the family member/significant other with whom the patient will be residing, and identified as the person(s) who will aid the patient in the event of a mental health crisis (suicidal ideations/suicide attempt).  With written consent from the patient, the family member/significant other has been provided the following suicide prevention education, prior to the and/or following the discharge of the patient.  The suicide prevention education provided includes the following:  Suicide risk factors  Suicide prevention and interventions  National Suicide Hotline telephone number  [facility] assessment telephone number  [facility] Emergency Assistance [place] and/or Residential Mobile Crisis Unit telephone number  Request made of family/significant other to:  Remove weapons (e.g., guns, rifles, knives), all items previously/currently identified as safety concern.    Remove drugs/medications (over-the-counter, prescriptions, illicit drugs), all items previously/currently identified as a safety concern.  The family member/significant other verbalizes understanding of the suicide prevention education information provided.  The family member/significant other agrees to remove the items of safety concern listed above.  Roque Lias B 01/15/2014, 2:54 PM

## 2014-01-15 NOTE — Tx Team (Signed)
  Interdisciplinary Treatment Plan Update   Date Reviewed:  01/15/2014  Time Reviewed:  10:26 AM  Progress in Treatment:   Attending groups: Yes Participating in groups: Yes Taking medication as prescribed: Yes  Tolerating medication: Yes Family/Significant other contact made: Yes  Patient understands diagnosis: Yes  Discussing patient identified problems/goals with staff: Yes  See initial care plan Medical problems stabilized or resolved: Yes Denies suicidal/homicidal ideation: Yes  In tx team Patient has not harmed self or others: Yes  For review of initial/current patient goals, please see plan of care.  Estimated Length of Stay:  D/C today  Reason for Continuation of Hospitalization:   New Problems/Goals identified:  N/A  Discharge Plan or Barriers:   return home, follow up [facility]  Additional Comments:  John [name] started out the AM stating he was positive for all symptoms; AH/VH, depression and SI.  We brought him into treatment team and asked him if he needs anything before we d/c him.  He focused on his medications, wanting to drop some, change others.  He did not bring up a concern about his symptoms. Asked for a bus pass and agreed to follow up at [facility]    Attendees:  Signature: Steva Colder, MD 01/15/2014 10:26 AM   Signature: Ripley Fraise, LCSW 01/15/2014 10:26 AM  Signature: Elmarie Shiley, NP 01/15/2014 10:26 AM  Signature: Gerald Leitz, RN 01/15/2014 10:26 AM  Signature: John [name], Patient 01/15/2014 10:26 AM  Signature:  01/15/2014 10:26 AM  Signature:   01/15/2014 10:26 AM  Signature:    Signature:    Signature:    Signature:    Signature:    Signature:      Scribe for Treatment Team:   Ripley Fraise, LCSW  01/15/2014 10:26 AM

## 2014-01-15 NOTE — Progress Notes (Signed)
Patient packed and ready for discharge. Teaching completed, RXs given along with sample meds. Bus pass provided and location of bus stop reviewed. All belongings returned. Patient verbalizes understanding. Patient is discharged in safe, stable condition. Contracts for safety as this is his baseline and denies HI. Jamie Kato

## 2014-01-15 NOTE — [facility] Suicide Risk Assessment (Signed)
Demographic Factors:  Male and Low socioeconomic status  Total Time spent with patient: 45 minutes  Psychiatric Specialty Exam: Physical Exam  ROS  Blood pressure 112/95, pulse 129, temperature 97.5 F (36.4 C), temperature source Oral, resp. rate 16, height 5' 7.5" (1.715 m), weight 90.719 kg (200 lb), SpO2 98 %.Body mass index is 30.84 kg/(m^2).  General Appearance: Casual  Eye Contact::  Fair  Speech:  Clear and Coherent  Volume:  Normal  Mood:  Euthymic  Affect:  Appropriate  Thought Process:  Coherent  Orientation:  Full (Time, Place, and Person)  Thought Content:  Hallucinations: Auditory Visual Patient at baseline has chronic AH as well as VH ,and these are bizarre hallucinations of purple dinosaurs and unicorns .Its likely that patient is fabricating this to stay in the hospital.None of the medications changes has helped the pt with his hallucinations ,however objective presentation  does not relate with his subjective sx. He is never seen internally preoccupied or responding to internal stimuli on the unit.  Suicidal Thoughts:  Pt at baseline has chronic SI ,which comes and goes ,patient has had thoughts about cutting self and jumping off a bridge in the past ,however he has never done anything. Pt has a hx of using psychiatric sx as a way to stay in the hospital .  Homicidal Thoughts:  No  Memory:  Immediate;   Fair Recent;   Fair Remote;   Fair  Judgement:  Impaired  Insight:  Shallow  Psychomotor Activity:  Normal  Concentration:  Fair  Recall:  [place]  Language: Fair  Akathisia:  No  Handed:  Right  AIMS (if indicated):     Assets:  Communication Skills  Sleep:  Number of Hours: 6.75    Musculoskeletal: Strength & Muscle Tone: within normal limits Gait & Station: normal Patient leans: N/A   Mental Status Per Nursing Assessment::   On Admission:  Suicidal ideation indicated by patient, Self-harm thoughts  Current Mental Status by  Physician: Patient as noted above, uses psychiatric sx as a way to stay in the hospital.Patient at baseline has bizarre hallucinations as well as passive SI .Patient however has never acted on it. Pt also denies HI today.Pt has had medication changes ,but reports worsening of symptoms on a daily basis .However his objective presentation does not relate with his subjective sx. Pt seen very interactive ,seen out more ,watching TV ,is calm ,with no anxiety sx (objectively) ,seen in groups activities as well as has not exhibited being internally preoccupied or responding to internal stimuli.  Loss Factors: Decline in physical health  Historical Factors: pt has a hx of noncompliance with medications ,pt never follows up with any recommendations.  Risk Reduction Factors:   Living with another person, especially a relative and Positive social support  Continued Clinical Symptoms:  Alcohol/Substance Abuse/Dependencies Previous Psychiatric Diagnoses and Treatments Medical Diagnoses and Treatments/Surgeries  Cognitive Features That Contribute To Risk:  Closed-mindedness    Suicide Risk:  Acute risk is Minimal: No identifiable suicidal ideation.  Chronic risk is moderate since patient is male ,has significant substance abuse problems ,has chronic SI ,is single ,has medical issues ,has chronic mental illness resulting from his substance abuse.   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 Asthma   .  HIV (human immunodeficiency virus infection)     "dx'd ~ 2 yr ago" (09/29/2012)  . Mental disorder   . Anxiety   . Depression   . Chronic low back pain   . Alcohol abuse   . HIV disease   . Hypertension   . Asthma     Plan Of Care/Follow-up recommendations:  Activity:  no restrictions Diet:  regular.  Is patient on multiple antipsychotic therapies at discharge:  No   Has Patient had three or more failed trials of antipsychotic monotherapy by history:  No  Recommended Plan for Multiple Antipsychotic Therapies: NA    Rhodesia Stanger MD 01/15/2014, 9:46 AM

## 2014-01-15 NOTE — Progress Notes (Signed)
Patient appears at baseline as he routinely reports bizarre VH of dinosaurs, elephants and giraffes of varying colors. He can be irritable but is overall cooperative. Endorses passive SI however contracts for safety and upon discharge. States he knows when to seek emergent help for any SI. The passive SI is at baseline as well as this patient is well known to [facility]. Medicated per orders. D/C plan discussed with him during treatment team and he acknowledges understanding. Assures this RN that he will stay med compliant. Denies AH/HI and remains safe. Jamie Kato

## 2014-01-18 NOTE — Progress Notes (Signed)
Patient Discharge Instructions:  After Visit Summary (AVS):   Faxed to:  01/18/14 Discharge Summary Note:   Faxed to:  01/18/14 Psychiatric Admission Assessment Note:   Faxed to:  01/18/14 Suicide Risk Assessment - Discharge Assessment:   Faxed to:  01/18/14 Faxed/Sent to the Next Level Care provider:  01/18/14 Faxed to [facility] [place], 01/18/2014, 2:52 PM

## 2014-01-30 ENCOUNTER — Encounter (HOSPITAL_COMMUNITY): Payer: Self-pay | Admitting: Emergency Medicine

## 2014-01-30 ENCOUNTER — Emergency Department (HOSPITAL_COMMUNITY): Payer: Self-pay

## 2014-01-30 ENCOUNTER — Emergency Department (HOSPITAL_COMMUNITY)
Admission: EM | Admit: 2014-01-30 | Discharge: 2014-01-31 | Disposition: A | Discharge status: Home / Self Care | Payer: Self-pay | Attending: Emergency Medicine | Admitting: Emergency Medicine

## 2014-01-30 DIAGNOSIS — Y9289 Other specified places as the place of occurrence of the external cause: Secondary | ICD-10-CM | POA: Insufficient documentation

## 2014-01-30 DIAGNOSIS — Z21 Asymptomatic human immunodeficiency virus [HIV] infection status: Secondary | ICD-10-CM | POA: Insufficient documentation

## 2014-01-30 DIAGNOSIS — Y9389 Activity, other specified: Secondary | ICD-10-CM | POA: Insufficient documentation

## 2014-01-30 DIAGNOSIS — S60511A Abrasion of right hand, initial encounter: Secondary | ICD-10-CM | POA: Insufficient documentation

## 2014-01-30 DIAGNOSIS — Y998 Other external cause status: Secondary | ICD-10-CM | POA: Insufficient documentation

## 2014-01-30 DIAGNOSIS — F329 Major depressive disorder, single episode, unspecified: Secondary | ICD-10-CM | POA: Insufficient documentation

## 2014-01-30 DIAGNOSIS — F419 Anxiety disorder, unspecified: Secondary | ICD-10-CM | POA: Insufficient documentation

## 2014-01-30 DIAGNOSIS — M109 Gout, unspecified: Secondary | ICD-10-CM

## 2014-01-30 DIAGNOSIS — M25531 Pain in right wrist: Secondary | ICD-10-CM | POA: Insufficient documentation

## 2014-01-30 DIAGNOSIS — M10041 Idiopathic gout, right hand: Secondary | ICD-10-CM | POA: Insufficient documentation

## 2014-01-30 DIAGNOSIS — Z79899 Other long term (current) drug therapy: Secondary | ICD-10-CM | POA: Insufficient documentation

## 2014-01-30 DIAGNOSIS — Z720 Tobacco use: Secondary | ICD-10-CM | POA: Insufficient documentation

## 2014-01-30 DIAGNOSIS — I10 Essential (primary) hypertension: Secondary | ICD-10-CM | POA: Insufficient documentation

## 2014-01-30 DIAGNOSIS — J45909 Unspecified asthma, uncomplicated: Secondary | ICD-10-CM | POA: Insufficient documentation

## 2014-01-30 DIAGNOSIS — G8929 Other chronic pain: Secondary | ICD-10-CM | POA: Insufficient documentation

## 2014-01-30 LAB — CBC WITH DIFFERENTIAL/PLATELET
Basophils Absolute: 0 10*3/uL (ref 0.0–0.1)
Basophils Relative: 0 % (ref 0–1)
EOS ABS: 0.2 10*3/uL (ref 0.0–0.7)
Eosinophils Relative: 2 % (ref 0–5)
HCT: 41.2 % (ref 39.0–52.0)
HEMOGLOBIN: 14 g/dL (ref 13.0–17.0)
LYMPHS ABS: 3.7 10*3/uL (ref 0.7–4.0)
Lymphocytes Relative: 43 % (ref 12–46)
MCH: 31.3 pg (ref 26.0–34.0)
MCHC: 34 g/dL (ref 30.0–36.0)
MCV: 92 fL (ref 78.0–100.0)
MONO ABS: 0.9 10*3/uL (ref 0.1–1.0)
MONOS PCT: 10 % (ref 3–12)
Neutro Abs: 3.9 10*3/uL (ref 1.7–7.7)
Neutrophils Relative %: 45 % (ref 43–77)
Platelets: 437 10*3/uL — ABNORMAL HIGH (ref 150–400)
RBC: 4.48 MIL/uL (ref 4.22–5.81)
RDW: 13.2 % (ref 11.5–15.5)
WBC: 8.6 10*3/uL (ref 4.0–10.5)

## 2014-01-30 LAB — BASIC METABOLIC PANEL
Anion gap: 19 — ABNORMAL HIGH (ref 5–15)
BUN: 9 mg/dL (ref 6–23)
CO2: 20 mEq/L (ref 19–32)
Calcium: 9.7 mg/dL (ref 8.4–10.5)
Chloride: 99 mEq/L (ref 96–112)
Creatinine, Ser: 0.9 mg/dL (ref 0.50–1.35)
GFR calc Af Amer: >90 mL/min (ref >90)
Glucose, Bld: 95 mg/dL (ref 70–99)
Potassium: 4.3 mEq/L (ref 3.7–5.3)
Sodium: 138 mEq/L (ref 137–147)

## 2014-01-30 LAB — URIC ACID: Uric Acid, Serum: 10.9 mg/dL — ABNORMAL HIGH (ref 4.0–7.8)

## 2014-01-30 MED ORDER — VANCOMYCIN HCL 10 G IV SOLR: 1500.0000 mg | Freq: Two times a day (BID) | INTRAVENOUS | Status: DC

## 2014-01-30 MED ORDER — MORPHINE SULFATE 4 MG/ML IJ SOLN
4.0000 mg | Freq: Once | INTRAMUSCULAR | Status: AC
  Administered 2014-01-30: 4 mg via INTRAVENOUS
  Filled 2014-01-30: qty 1

## 2014-01-30 MED ORDER — CEPHALEXIN 500 MG PO CAPS: 500.0000 mg | ORAL_CAPSULE | Freq: Three times a day (TID) | ORAL | Status: DC

## 2014-01-30 MED ORDER — ONDANSETRON HCL 4 MG/2ML IJ SOLN
4.0000 mg | Freq: Once | INTRAMUSCULAR | Status: AC
  Administered 2014-01-30: 4 mg via INTRAVENOUS
  Filled 2014-01-30: qty 2

## 2014-01-30 MED ORDER — KETOROLAC TROMETHAMINE 30 MG/ML IJ SOLN
30.0000 mg | Freq: Once | INTRAMUSCULAR | Status: AC
  Administered 2014-01-30: 30 mg via INTRAVENOUS
  Filled 2014-01-30: qty 1

## 2014-01-30 MED ORDER — HYDROMORPHONE HCL 1 MG/ML IJ SOLN
1.0000 mg | Freq: Once | INTRAMUSCULAR | Status: AC
Start: 2014-01-30 — End: 2014-01-30
  Administered 2014-01-30: 1 mg via INTRAVENOUS
  Filled 2014-01-30: qty 1

## 2014-01-30 MED ORDER — COLCHICINE 0.6 MG PO TABS
0.6000 mg | ORAL_TABLET | Freq: Once | ORAL | Status: AC
  Administered 2014-01-30: 0.6 mg via ORAL
  Filled 2014-01-30: qty 1

## 2014-01-30 MED ORDER — COLCHICINE 0.6 MG PO TABS: 0.6000 mg | ORAL_TABLET | Freq: Two times a day (BID) | ORAL | Status: DC

## 2014-01-30 MED ORDER — IPRATROPIUM BROMIDE 0.02 % IN SOLN: 0.5000 mg | Freq: Once | RESPIRATORY_TRACT | Status: DC

## 2014-01-30 MED ORDER — INDOMETHACIN 25 MG PO CAPS: 25.0000 mg | ORAL_CAPSULE | Freq: Three times a day (TID) | ORAL | Status: DC | PRN

## 2014-01-30 MED ORDER — PREDNISONE 20 MG PO TABS: 60.0000 mg | ORAL_TABLET | Freq: Once | ORAL | Status: DC

## 2014-01-30 MED ORDER — VANCOMYCIN HCL 10 G IV SOLR
1500.0000 mg | Freq: Once | INTRAVENOUS | Status: AC
  Administered 2014-01-30: 1500 mg via INTRAVENOUS
  Filled 2014-01-30: qty 1500

## 2014-01-30 MED ORDER — ALBUTEROL SULFATE (2.5 MG/3ML) 0.083% IN NEBU: 5.0000 mg | INHALATION_SOLUTION | Freq: Once | RESPIRATORY_TRACT | Status: DC
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
## 2014-01-30 NOTE — Discharge Instructions (Signed)
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
## 2014-02-01 ENCOUNTER — Other Ambulatory Visit: Payer: Self-pay | Admitting: *Deleted

## 2014-02-01 DIAGNOSIS — B20 Human immunodeficiency virus [HIV] disease: Secondary | ICD-10-CM

## 2014-02-01 MED ORDER — ELVITEG-COBIC-EMTRICIT-TENOFDF 150-150-200-300 MG PO TABS: 1.0000 | ORAL_TABLET | Freq: Every day | ORAL | Status: DC

## 2014-02-01 MED ORDER — DARUNAVIR ETHANOLATE 800 MG PO TABS: 800.0000 mg | ORAL_TABLET | Freq: Every day | ORAL | Status: DC

## 2014-02-05 LAB — CULTURE, BLOOD (ROUTINE X 2)
Culture: NO GROWTH
Culture: NO GROWTH

## 2014-02-28 ENCOUNTER — Ambulatory Visit (INDEPENDENT_AMBULATORY_CARE_PROVIDER_SITE_OTHER): Payer: Self-pay | Admitting: Internal Medicine

## 2014-02-28 DIAGNOSIS — L290 Pruritus ani: Secondary | ICD-10-CM

## 2014-02-28 DIAGNOSIS — B20 Human immunodeficiency virus [HIV] disease: Secondary | ICD-10-CM

## 2014-02-28 DIAGNOSIS — F3162 Bipolar disorder, current episode mixed, moderate: Secondary | ICD-10-CM

## 2014-02-28 MED ORDER — HYDROCORTISONE 2.5 % RE CREA: TOPICAL_CREAM | RECTAL | Status: DC
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
## 2014-03-06 ENCOUNTER — Ambulatory Visit (INDEPENDENT_AMBULATORY_CARE_PROVIDER_SITE_OTHER): Payer: Self-pay | Admitting: Infectious Disease

## 2014-03-06 ENCOUNTER — Encounter: Payer: Self-pay | Admitting: Infectious Disease

## 2014-03-06 ENCOUNTER — Other Ambulatory Visit (HOSPITAL_COMMUNITY)
Admission: RE | Admit: 2014-03-06 | Discharge: 2014-03-06 | Disposition: A | Discharge status: Home / Self Care | Payer: MEDICAID | Source: Ambulatory Visit | Attending: Infectious Disease | Admitting: Infectious Disease

## 2014-03-06 VITALS — BP 161/89 | HR 82 | Temp 99.0°F | Wt 193.0 lb

## 2014-03-06 DIAGNOSIS — Z113 Encounter for screening for infections with a predominantly sexual mode of transmission: Secondary | ICD-10-CM | POA: Insufficient documentation

## 2014-03-06 DIAGNOSIS — Z5900 Homelessness unspecified: Secondary | ICD-10-CM

## 2014-03-06 DIAGNOSIS — F1420 Cocaine dependence, uncomplicated: Secondary | ICD-10-CM

## 2014-03-06 DIAGNOSIS — Z91199 Patient's noncompliance with other medical treatment and regimen due to unspecified reason: Secondary | ICD-10-CM

## 2014-03-06 DIAGNOSIS — F1494 Cocaine use, unspecified with cocaine-induced mood disorder: Secondary | ICD-10-CM

## 2014-03-06 DIAGNOSIS — Z9119 Patient's noncompliance with other medical treatment and regimen: Secondary | ICD-10-CM

## 2014-03-06 DIAGNOSIS — B20 Human immunodeficiency virus [HIV] disease: Secondary | ICD-10-CM

## 2014-03-06 LAB — CBC WITH DIFFERENTIAL/PLATELET
BASOS PCT: 1 % (ref 0–1)
Basophils Absolute: 0 10*3/uL (ref 0.0–0.1)
EOS ABS: 0.1 10*3/uL (ref 0.0–0.7)
Eosinophils Relative: 2 % (ref 0–5)
HEMATOCRIT: 43.1 % (ref 39.0–52.0)
HEMOGLOBIN: 14.9 g/dL (ref 13.0–17.0)
LYMPHS PCT: 38 % (ref 12–46)
Lymphs Abs: 1.3 10*3/uL (ref 0.7–4.0)
MCH: 31.8 pg (ref 26.0–34.0)
MCHC: 34.6 g/dL (ref 30.0–36.0)
MCV: 92.1 fL (ref 78.0–100.0)
MPV: 9.4 fL (ref 9.4–12.4)
Monocytes Absolute: 0.5 10*3/uL (ref 0.1–1.0)
Monocytes Relative: 13 % — ABNORMAL HIGH (ref 3–12)
NEUTROS PCT: 46 % (ref 43–77)
Neutro Abs: 1.6 10*3/uL — ABNORMAL LOW (ref 1.7–7.7)
PLATELETS: 296 10*3/uL (ref 150–400)
RBC: 4.68 MIL/uL (ref 4.22–5.81)
RDW: 14.4 % (ref 11.5–15.5)
WBC: 3.5 10*3/uL — ABNORMAL LOW (ref 4.0–10.5)

## 2014-03-06 LAB — COMPLETE METABOLIC PANEL WITH GFR
ALBUMIN: 4 g/dL (ref 3.5–5.2)
ALK PHOS: 78 U/L (ref 39–117)
ALT: 36 U/L (ref 0–53)
AST: 48 U/L — ABNORMAL HIGH (ref 0–37)
BUN: 8 mg/dL (ref 6–23)
CO2: 23 mEq/L (ref 19–32)
Calcium: 9.2 mg/dL (ref 8.4–10.5)
Chloride: 100 mEq/L (ref 96–112)
Creat: 0.97 mg/dL (ref 0.50–1.35)
GFR, Est African American: >89 mL/min
Glucose, Bld: 87 mg/dL (ref 70–99)
POTASSIUM: 4.3 meq/L (ref 3.5–5.3)
Sodium: 137 mEq/L (ref 135–145)
Total Bilirubin: 0.5 mg/dL (ref 0.2–1.2)
Total Protein: 6.9 g/dL (ref 6.0–8.3)

## 2014-03-06 LAB — RPR

## 2014-03-06 NOTE — Progress Notes (Signed)
   Subjective:    Patient ID: John [name], male    DOB: 01-15-1971, 43 y.o.   MRN: [id number]  HPI  43 year old with HIV, substance abuse homelessness who is actually a [facility] patient who had originally been in the [facility]. He is followed by Dr. Baxter Flattery who has started pt on STRIBILD and Prezista via [facility] (though he could have received meds for free via [facility]). His aunt is making sure he takes his meds. She picks him up daily gets him meds with food. She states he has not missed any doses since we last saw him. Itching is improved since we last saw him but they could not afford the steroid (rectal hydrocortisone). He denies using cocaine anymore.  Review of Systems  Constitutional: Negative for fever, chills, diaphoresis, activity change, appetite change, fatigue and unexpected weight change.  HENT: Negative for congestion, rhinorrhea, sinus pressure, sneezing, sore throat and trouble swallowing.   Eyes: Negative for photophobia and visual disturbance.  Respiratory: Negative for cough, chest tightness, shortness of breath, wheezing and stridor.   Cardiovascular: Negative for chest pain, palpitations and leg swelling.  Gastrointestinal: Negative for nausea, vomiting, abdominal pain, diarrhea, constipation, blood in stool, abdominal distention and anal bleeding.  Genitourinary: Negative for dysuria, hematuria, flank pain and difficulty urinating.  Musculoskeletal: Negative for myalgias, back pain, joint swelling, arthralgias and gait problem.  Skin: Negative for color change, pallor, rash and wound.  Neurological: Positive for headaches. Negative for dizziness, tremors, weakness and light-headedness.  Hematological: Negative for adenopathy. Does not bruise/bleed easily.  Psychiatric/Behavioral: Negative for confusion, sleep disturbance, decreased concentration and agitation.        Objective:   Physical Exam  Constitutional: He is oriented to person, place, and time.  HENT:  Head:  Normocephalic and atraumatic.  Eyes: Conjunctivae and EOM are normal.  Neck: Normal range of motion. Neck supple.  Cardiovascular: Normal rate and regular rhythm.   Pulmonary/Chest: Effort normal. No respiratory distress. He has no wheezes.  Abdominal: Soft. He exhibits no distension.  Musculoskeletal: Normal range of motion. He exhibits no edema or tenderness.  Neurological: He is alert and oriented to person, place, and time.  Skin: Skin is warm and dry. No rash noted. No erythema. No pallor.  Psychiatric: His speech is delayed. He is slowed.          Assessment & Plan:   HIV: problems in past with compliance due to his chaotic lifestyle. He is [facility] patient and he can get a FOUR MONTH SUPPLY of it at at time from our study at [facility] cost. SInce his [name] is managing meds reliably can feel fine giving him 4 months supply (she will pick them up when ready).   I will check HIV RNA with genotype and INI genotype and HIV ARCHIVE GENOSURE because he may not need to be on both prezista and STRIBILD though fairly simple two pill regimen  I spent greater than 40 minutes with the patient including greater than 50% of time in face to face counsel of the patient and in coordination of their care.   Cocaine use: talked to him about dangers of this drug and in particualr levimasole  Itching better

## 2014-03-06 NOTE — Patient Instructions (Signed)
WE WILL CHECK BLOOD WORK TODAY  YOU CAN OBTAIN BOTH THE STRIBILD AND THE PREZISTA FROM THE [facility] STUDY

## 2014-03-07 LAB — URINE CYTOLOGY ANCILLARY ONLY
CHLAMYDIA, DNA PROBE: NEGATIVE
Neisseria Gonorrhea: NEGATIVE

## 2014-03-07 LAB — T-HELPER CELL (CD4) - (RCID CLINIC ONLY)
CD4 % Helper T Cell: 27 % — ABNORMAL LOW (ref 33–55)
CD4 T Cell Abs: 360 /uL — ABNORMAL LOW (ref 400–2700)

## 2014-03-09 LAB — HIV-1 RNA ULTRAQUANT REFLEX TO GENTYP+

## 2014-03-14 ENCOUNTER — Encounter (HOSPITAL_COMMUNITY): Payer: Self-pay | Admitting: *Deleted

## 2014-03-14 ENCOUNTER — Emergency Department (HOSPITAL_COMMUNITY)
Admission: EM | Admit: 2014-03-14 | Discharge: 2014-03-14 | Disposition: A | Discharge status: Home / Self Care | Payer: MEDICAID | Attending: Emergency Medicine | Admitting: Emergency Medicine

## 2014-03-14 DIAGNOSIS — J45909 Unspecified asthma, uncomplicated: Secondary | ICD-10-CM | POA: Insufficient documentation

## 2014-03-14 DIAGNOSIS — Z720 Tobacco use: Secondary | ICD-10-CM | POA: Insufficient documentation

## 2014-03-14 DIAGNOSIS — I10 Essential (primary) hypertension: Secondary | ICD-10-CM | POA: Insufficient documentation

## 2014-03-14 DIAGNOSIS — M109 Gout, unspecified: Secondary | ICD-10-CM

## 2014-03-14 DIAGNOSIS — Z79899 Other long term (current) drug therapy: Secondary | ICD-10-CM | POA: Insufficient documentation

## 2014-03-14 DIAGNOSIS — F419 Anxiety disorder, unspecified: Secondary | ICD-10-CM | POA: Insufficient documentation

## 2014-03-14 DIAGNOSIS — M10041 Idiopathic gout, right hand: Secondary | ICD-10-CM | POA: Insufficient documentation

## 2014-03-14 DIAGNOSIS — Z21 Asymptomatic human immunodeficiency virus [HIV] infection status: Secondary | ICD-10-CM | POA: Insufficient documentation

## 2014-03-14 DIAGNOSIS — G8929 Other chronic pain: Secondary | ICD-10-CM | POA: Insufficient documentation

## 2014-03-14 MED ORDER — INDOMETHACIN 25 MG PO CAPS
50.0000 mg | ORAL_CAPSULE | Freq: Once | ORAL | Status: DC
  Filled 2014-03-14: qty 2

## 2014-03-14 MED ORDER — OXYCODONE-ACETAMINOPHEN 5-325 MG PO TABS: 1.0000 | ORAL_TABLET | Freq: Four times a day (QID) | ORAL | Status: DC | PRN

## 2014-03-14 MED ORDER — MORPHINE SULFATE 4 MG/ML IJ SOLN
4.0000 mg | Freq: Once | INTRAMUSCULAR | Status: AC
  Administered 2014-03-14: 4 mg via INTRAMUSCULAR
  Filled 2014-03-14: qty 1

## 2014-03-14 MED ORDER — OXYCODONE-ACETAMINOPHEN 5-325 MG PO TABS
1.0000 | ORAL_TABLET | Freq: Once | ORAL | Status: AC
  Administered 2014-03-14: 1 via ORAL
  Filled 2014-03-14: qty 1

## 2014-03-14 MED ORDER — ONDANSETRON 4 MG PO TBDP
4.0000 mg | ORAL_TABLET | Freq: Once | ORAL | Status: AC
  Administered 2014-03-14: 4 mg via ORAL
  Filled 2014-03-14: qty 1

## 2014-03-14 NOTE — Discharge Instructions (Signed)
Gout Gout is an inflammatory arthritis caused by a buildup of uric acid crystals in the joints. Uric acid is a chemical that is normally present in the blood. When the level of uric acid in the blood is too high it can form crystals that deposit in your joints and tissues. This causes joint redness, soreness, and swelling (inflammation). Repeat attacks are common. Over time, uric acid crystals can form into masses (tophi) near a joint, destroying bone and causing disfigurement. Gout is treatable and often preventable. CAUSES  The disease begins with elevated levels of uric acid in the blood. Uric acid is produced by your body when it breaks down a naturally found substance called purines. Certain foods you eat, such as meats and fish, contain high amounts of purines. Causes of an elevated uric acid level include:  Being passed down from parent to child (heredity).  Diseases that cause increased uric acid production (such as obesity, psoriasis, and certain cancers).  Excessive alcohol use.  Diet, especially diets rich in meat and seafood.  Medicines, including certain cancer-fighting medicines (chemotherapy), water pills (diuretics), and aspirin.  Chronic kidney disease. The kidneys are no longer able to remove uric acid well.  Problems with metabolism. Conditions strongly associated with gout include:  Obesity.  High blood pressure.  High cholesterol.  Diabetes. Not everyone with elevated uric acid levels gets gout. It is not understood why some people get gout and others do not. Surgery, joint injury, and eating too much of certain foods are some of the factors that can lead to gout attacks. SYMPTOMS   An attack of gout comes on quickly. It causes intense pain with redness, swelling, and warmth in a joint.  Fever can occur.  Often, only one joint is involved. Certain joints are more commonly involved:  Base of the big toe.  Knee.  Ankle.  Wrist.  Finger. Without  treatment, an attack usually goes away in a few days to weeks. Between attacks, you usually will not have symptoms, which is different from many other forms of arthritis. DIAGNOSIS  Your caregiver will suspect gout based on your symptoms and exam. In some cases, tests may be recommended. The tests may include:  Blood tests.  Urine tests.  X-rays.  Joint fluid exam. This exam requires a needle to remove fluid from the joint (arthrocentesis). Using a microscope, gout is confirmed when uric acid crystals are seen in the joint fluid. TREATMENT  There are two phases to gout treatment: treating the sudden onset (acute) attack and preventing attacks (prophylaxis).  Treatment of an Acute Attack.  Medicines are used. These include anti-inflammatory medicines or steroid medicines.  An injection of steroid medicine into the affected joint is sometimes necessary.  The painful joint is rested. Movement can worsen the arthritis.  You may use warm or cold treatments on painful joints, depending which works best for you.  Treatment to Prevent Attacks.  If you suffer from frequent gout attacks, your caregiver may advise preventive medicine. These medicines are started after the acute attack subsides. These medicines either help your kidneys eliminate uric acid from your body or decrease your uric acid production. You may need to stay on these medicines for a very long time.  The early phase of treatment with preventive medicine can be associated with an increase in acute gout attacks. For this reason, during the first few months of treatment, your caregiver may also advise you to take medicines usually used for acute gout treatment. Be sure you  understand your caregiver's directions. Your caregiver may make several adjustments to your medicine dose before these medicines are effective.  Discuss dietary treatment with your caregiver or dietitian. Alcohol and drinks high in sugar and fructose and foods  such as meat, poultry, and seafood can increase uric acid levels. Your caregiver or dietitian can advise you on drinks and foods that should be limited. HOME CARE INSTRUCTIONS   Do not take aspirin to relieve pain. This raises uric acid levels.  Only take over-the-counter or prescription medicines for pain, discomfort, or fever as directed by your caregiver.  Rest the joint as much as possible. When in bed, keep sheets and blankets off painful areas.  Keep the affected joint raised (elevated).  Apply warm or cold treatments to painful joints. Use of warm or cold treatments depends on which works best for you.  Use crutches if the painful joint is in your leg.  Drink enough fluids to keep your urine clear or pale yellow. This helps your body get rid of uric acid. Limit alcohol, sugary drinks, and fructose drinks.  Follow your dietary instructions. Pay careful attention to the amount of protein you eat. Your daily diet should emphasize fruits, vegetables, whole grains, and fat-free or low-fat milk products. Discuss the use of coffee, vitamin C, and cherries with your caregiver or dietitian. These may be helpful in lowering uric acid levels.  Maintain a healthy body weight. SEEK MEDICAL CARE IF:   You develop diarrhea, vomiting, or any side effects from medicines.  You do not feel better in 24 hours, or you are getting worse. SEEK IMMEDIATE MEDICAL CARE IF:   Your joint becomes suddenly more tender, and you have chills or a fever. MAKE SURE YOU:   Understand these instructions.  Will watch your condition.  Will get help right away if you are not doing well or get worse. Document Released: 02/27/2000 Document Revised: 07/16/2013 Document Reviewed: 10/13/2011 [facility] Patient Information 2015 [place], [place]. This information is not intended to replace advice given to you by your health care provider. Make sure you discuss any questions you have with your health care  provider.  Gout Gout is when your joints become red, sore, and swell (inflamed). This is caused by the buildup of uric acid crystals in the joints. Uric acid is a chemical that is normally in the blood. If the level of uric acid gets too high in the blood, these crystals form in your joints and tissues. Over time, these crystals can form into masses near the joints and tissues. These masses can destroy bone and cause the bone to look misshapen (deformed). HOME CARE   Do not take aspirin for pain.  Only take medicine as told by your doctor.  Rest the joint as much as you can. When in bed, keep sheets and blankets off painful areas.  Keep the sore joints raised (elevated).  Put warm or cold packs on painful joints. Use of warm or cold packs depends on which works best for you.  Use crutches if the painful joint is in your leg.  Drink enough fluids to keep your pee (urine) clear or pale yellow. Limit alcohol, sugary drinks, and drinks with fructose in them.  Follow your diet instructions. Pay careful attention to how much protein you eat. Include fruits, vegetables, whole grains, and fat-free or low-fat milk products in your daily diet. Talk to your doctor or dietitian about the use of coffee, vitamin C, and cherries. These may help lower uric  acid levels.  Keep a healthy body weight. GET HELP RIGHT AWAY IF:   You have watery poop (diarrhea), throw up (vomit), or have any side effects from medicines.  You do not feel better in 24 hours, or you are getting worse.  Your joint becomes suddenly more tender, and you have chills or a fever. MAKE SURE YOU:   Understand these instructions.  Will watch your condition.  Will get help right away if you are not doing well or get worse. Document Released: 12/09/2007 Document Revised: 07/16/2013 Document Reviewed: 10/13/2011 [facility] Patient Information 2015 [place], [place]. This information is not intended to replace advice given to you by your  health care provider. Make sure you discuss any questions you have with your health care provider.

## 2014-03-14 NOTE — ED Notes (Signed)
Hx of gout, no meds. Right hand red and swollen x 6 days.

## 2014-03-14 NOTE — ED Notes (Signed)
Pt reports gout pain to right hand and wrist x 5 days.

## 2014-03-14 NOTE — ED Provider Notes (Signed)
CSN: [id number]     Arrival date & time 03/14/14  1228 History  This chart was scribed for non-physician practitioner, Delos Haring, PA-C  working with Tanna Furry, MD, by Erling Conte, ED Scribe. This patient was seen in room TR05C/TR05C and the patient's care was started at 1:30 PM.      Chief Complaint  Patient presents with  . Hand Pain   The history is provided by the patient. No language interpreter was used.    HPI Comments: John [name] is a 43 y.o. male with a h/o asthma, HIV, anxiety, depression, chronic low back pain, ETOH abuse, gout, and HTN who presents to the Emergency Department complaining of constant, moderate, gradually worsening, right hand pain for 6 days. Pt states he has had symptoms like this in the past and was dx earlier in the year with gout. He is having associated right hand swelling and redness. Pt has not been taking his gout medication and is unable to get his prescriptions filled due to him being homeless. Pt states he is unable to move the hand due to pain. He denies any injury to the hand. He denies any other issues at this time. He gets his HIV medications from Dr. Tommy [name]   Past Medical History  Diagnosis Date  . Asthma   . HIV (human immunodeficiency virus infection)     "dx'd ~ 2 yr ago" (09/29/2012)  . Mental disorder   . Anxiety   . Depression   . Chronic low back pain   . Alcohol abuse   . HIV disease   . Hypertension   . Asthma    Past Surgical History  Procedure Laterality Date  . Skin graft full thickness leg Left ?    POSTERIOR LEFT LEG  AFTER DOG BITES   Family History  Problem Relation Age of Onset  . Alcoholism Mother   . Alcoholism Brother    History  Substance Use Topics  . Smoking status: Current Every Day Smoker -- 0.50 packs/day for 27 years    Types: Cigarettes  . Smokeless tobacco: Not on file  . Alcohol Use: 29.4 oz/week    49 Cans of beer per week     Comment: Daily use     Review of Systems   Musculoskeletal: Positive for joint swelling (right hand) and arthralgias (right hand).  Skin: Positive for color change.  All other systems reviewed and are negative.     Allergies  Shellfish allergy  Home Medications   Prior to Admission medications   Medication Sig Start Date End Date Taking? Authorizing Provider  albuterol (PROVENTIL HFA;VENTOLIN HFA) 108 (90 BASE) MCG/ACT inhaler Inhale 2 puffs into the lungs every 4 (four) hours as needed for wheezing or shortness of breath.    Historical Provider, MD  benztropine (COGENTIN) 0.5 MG tablet Take 1 tablet (0.5 mg total) by mouth 2 (two) times daily. 01/15/14   Elmarie Shiley, NP  cephALEXin (KEFLEX) 500 MG capsule Take 1 capsule (500 mg total) by mouth 3 (three) times daily. Patient not taking: Reported on 03/06/2014 01/30/14   Margarita Mail, PA-C  colchicine 0.6 MG tablet Take 1 tablet (0.6 mg total) by mouth 2 (two) times daily. Patient not taking: Reported on 03/06/2014 01/30/14   Margarita Mail, PA-C  Darunavir Ethanolate (PREZISTA) 800 MG tablet Take 1 tablet (800 mg total) by mouth daily with breakfast. 02/01/14   Carlyle Basques, MD  divalproex (DEPAKOTE) 500 MG DR tablet Take 1 tablet (500 mg total) by  mouth every 12 (twelve) hours. 01/15/14   Elmarie Shiley, NP  elvitegravir-cobicistat-emtricitabine-tenofovir (STRIBILD) 150-150-200-300 MG TABS tablet Take 1 tablet by mouth daily with breakfast. 02/01/14   Carlyle Basques, MD  gabapentin (NEURONTIN) 100 MG capsule Take 2 capsules (200 mg total) by mouth 3 (three) times daily. 01/15/14   Elmarie Shiley, NP  oxyCODONE-acetaminophen (PERCOCET/ROXICET) 5-325 MG per tablet Take 1 tablet by mouth every 6 (six) hours as needed for severe pain. 03/14/14   Linus Mako, PA-C   Triage Vitals: BP 138/75 mmHg  Pulse 91  Temp(Src) 98.2 F (36.8 C) (Oral)  Resp 18  SpO2 99%  Physical Exam  Constitutional: He is oriented to person, place, and time. He appears well-developed and well-nourished.  No distress.  HENT:  Head: Normocephalic and atraumatic.  Eyes: Conjunctivae and EOM are normal.  Neck: Neck supple. No tracheal deviation present.  Cardiovascular: Normal rate.   Pulmonary/Chest: Effort normal. No respiratory distress.  Musculoskeletal:       Right hand: He exhibits decreased range of motion, tenderness and swelling. He exhibits no bony tenderness.  signifciant swelling and tenderness to dorsum of right hand. It is indurated. No wound to the skin.  Neurological: He is alert and oriented to person, place, and time.  Skin: Skin is warm and dry.  Psychiatric: He has a normal mood and affect. His behavior is normal.  Nursing note and vitals reviewed.   ED Course  Procedures (including critical care time)  DIAGNOSTIC STUDIES: Oxygen Saturation is 99% on RA, normal by my interpretation.    COORDINATION OF CARE: 1:12 PM- Will order Percocet.  Pt advised of plan for treatment and pt agrees.  1:35 PM- Will order morphine injection. Will also write pt a prescription for Percocet to get filled if he needs it. Pt advised of plan for treatment and pt agrees.      Labs Review Labs Reviewed - No data to display  Imaging Review No results found.   EKG Interpretation None      MDM   Final diagnoses:  Acute gout of right hand, unspecified cause    Patient presented with the same presentation recently and was diagnosed with gout after an elevated uric acid level. He did not get his medications field, colchicine, Indomethacine or Keflex due to being unable to afford anything and being told he "shouldnt take gout medications by Dr. Tommy [name]" because they will interact with his HIV medications.  Medications  ondansetron (ZOFRAN-ODT) disintegrating tablet 4 mg (not administered)  morphine 4 MG/ML injection 4 mg (not administered)  oxyCODONE-acetaminophen (PERCOCET/ROXICET) 5-325 MG per tablet 1 tablet (1 tablet Oral Given 03/14/14 1317)   Will rx percocet for pain and  have him f/u with his PCP.  43 y.o.John [name]'s evaluation in the Emergency Department is complete. It has been determined that no acute conditions requiring further emergency intervention are present at this time. The patient/guardian have been advised of the diagnosis and plan. We have discussed signs and symptoms that warrant return to the ED, such as changes or worsening in symptoms.  Vital signs are stable at discharge. Filed Vitals:   03/14/14 1232  BP: 138/75  Pulse: 91  Temp: 98.2 F (36.8 C)  Resp: 18    Patient/guardian has voiced understanding and agreed to follow-up with the PCP or specialist.  I personally performed the services described in this documentation, which was scribed in my presence. The recorded information has been reviewed and is accurate.     Casondra Gasca  Marilu Favre, PA-C 03/14/14 1347  Tanna Furry, MD 03/16/14 [id number]

## 2014-03-18 LAB — HIV-1 INTEGRASE GENOTYPE

## 2014-04-06 ENCOUNTER — Emergency Department (HOSPITAL_COMMUNITY)
Admission: EM | Admit: 2014-04-06 | Discharge: 2014-04-06 | Disposition: A | Discharge status: Home / Self Care | Payer: MEDICAID | Attending: Emergency Medicine | Admitting: Emergency Medicine

## 2014-04-06 ENCOUNTER — Encounter (HOSPITAL_COMMUNITY): Payer: Self-pay | Admitting: Physical Medicine and Rehabilitation

## 2014-04-06 DIAGNOSIS — G8929 Other chronic pain: Secondary | ICD-10-CM | POA: Insufficient documentation

## 2014-04-06 DIAGNOSIS — F419 Anxiety disorder, unspecified: Secondary | ICD-10-CM | POA: Insufficient documentation

## 2014-04-06 DIAGNOSIS — J45909 Unspecified asthma, uncomplicated: Secondary | ICD-10-CM | POA: Insufficient documentation

## 2014-04-06 DIAGNOSIS — I10 Essential (primary) hypertension: Secondary | ICD-10-CM | POA: Insufficient documentation

## 2014-04-06 DIAGNOSIS — M10041 Idiopathic gout, right hand: Secondary | ICD-10-CM | POA: Insufficient documentation

## 2014-04-06 DIAGNOSIS — Z23 Encounter for immunization: Secondary | ICD-10-CM | POA: Insufficient documentation

## 2014-04-06 DIAGNOSIS — F329 Major depressive disorder, single episode, unspecified: Secondary | ICD-10-CM | POA: Insufficient documentation

## 2014-04-06 DIAGNOSIS — M109 Gout, unspecified: Secondary | ICD-10-CM

## 2014-04-06 DIAGNOSIS — Z79899 Other long term (current) drug therapy: Secondary | ICD-10-CM | POA: Insufficient documentation

## 2014-04-06 DIAGNOSIS — Z720 Tobacco use: Secondary | ICD-10-CM | POA: Insufficient documentation

## 2014-04-06 MED ORDER — HYDROCODONE-ACETAMINOPHEN 5-325 MG PO TABS: 1.0000 | ORAL_TABLET | Freq: Four times a day (QID) | ORAL | Status: DC | PRN

## 2014-04-06 MED ORDER — HYDROCODONE-ACETAMINOPHEN 5-325 MG PO TABS
1.0000 | ORAL_TABLET | Freq: Once | ORAL | Status: AC
  Administered 2014-04-06: 1 via ORAL
  Filled 2014-04-06: qty 1

## 2014-04-06 MED ORDER — COLCHICINE 0.6 MG PO TABS
0.6000 mg | ORAL_TABLET | Freq: Two times a day (BID) | ORAL | Status: DC
Start: 2014-04-06 — End: 2014-04-10

## 2014-04-06 MED ORDER — COLCHICINE 0.6 MG PO TABS
0.6000 mg | ORAL_TABLET | Freq: Once | ORAL | Status: AC
  Administered 2014-04-06: 0.6 mg via ORAL
  Filled 2014-04-06: qty 1

## 2014-04-06 NOTE — ED Notes (Signed)
Pt in waiting room, to get on bus at 10am.

## 2014-04-06 NOTE — Discharge Instructions (Signed)
Please follow up with your primary care physician in 1-2 days. If you do not have one please call the [facility] number listed above. Please take pain medication and/or muscle relaxants as prescribed and as needed for pain. Please do not drive on narcotic pain medication or on muscle relaxants. Please read all discharge instructions and return precautions.    Gout Gout is an inflammatory arthritis caused by a buildup of uric acid crystals in the joints. Uric acid is a chemical that is normally present in the blood. When the level of uric acid in the blood is too high it can form crystals that deposit in your joints and tissues. This causes joint redness, soreness, and swelling (inflammation). Repeat attacks are common. Over time, uric acid crystals can form into masses (tophi) near a joint, destroying bone and causing disfigurement. Gout is treatable and often preventable. CAUSES  The disease begins with elevated levels of uric acid in the blood. Uric acid is produced by your body when it breaks down a naturally found substance called purines. Certain foods you eat, such as meats and fish, contain high amounts of purines. Causes of an elevated uric acid level include:  Being passed down from parent to child (heredity).  Diseases that cause increased uric acid production (such as obesity, psoriasis, and certain cancers).  Excessive alcohol use.  Diet, especially diets rich in meat and seafood.  Medicines, including certain cancer-fighting medicines (chemotherapy), water pills (diuretics), and aspirin.  Chronic kidney disease. The kidneys are no longer able to remove uric acid well.  Problems with metabolism. Conditions strongly associated with gout include:  Obesity.  High blood pressure.  High cholesterol.  Diabetes. Not everyone with elevated uric acid levels gets gout. It is not understood why some people get gout and others do not. Surgery, joint injury, and  eating too much of certain foods are some of the factors that can lead to gout attacks. SYMPTOMS   An attack of gout comes on quickly. It causes intense pain with redness, swelling, and warmth in a joint.  Fever can occur.  Often, only one joint is involved. Certain joints are more commonly involved:  Base of the big toe.  Knee.  Ankle.  Wrist.  Finger. Without treatment, an attack usually goes away in a few days to weeks. Between attacks, you usually will not have symptoms, which is different from many other forms of arthritis. DIAGNOSIS  Your caregiver will suspect gout based on your symptoms and exam. In some cases, tests may be recommended. The tests may include:  Blood tests.  Urine tests.  X-rays.  Joint fluid exam. This exam requires a needle to remove fluid from the joint (arthrocentesis). Using a microscope, gout is confirmed when uric acid crystals are seen in the joint fluid. TREATMENT  There are two phases to gout treatment: treating the sudden onset (acute) attack and preventing attacks (prophylaxis).  Treatment of an Acute Attack.  Medicines are used. These include anti-inflammatory medicines or steroid medicines.  An injection of steroid medicine into the affected joint is sometimes necessary.  The painful joint is rested. Movement can worsen the arthritis.  You may use warm or cold treatments on painful joints, depending which works best for you.  Treatment to Prevent Attacks.  If you suffer from frequent gout attacks, your caregiver may advise preventive medicine. These medicines are started after the acute attack subsides. These medicines either help your kidneys eliminate uric acid from your body or  decrease your uric acid production. You may need to stay on these medicines for a very long time.  The early phase of treatment with preventive medicine can be associated with an increase in acute gout attacks. For this reason, during the first few months  of treatment, your caregiver may also advise you to take medicines usually used for acute gout treatment. Be sure you understand your caregiver's directions. Your caregiver may make several adjustments to your medicine dose before these medicines are effective.  Discuss dietary treatment with your caregiver or dietitian. Alcohol and drinks high in sugar and fructose and foods such as meat, poultry, and seafood can increase uric acid levels. Your caregiver or dietitian can advise you on drinks and foods that should be limited. HOME CARE INSTRUCTIONS   Do not take aspirin to relieve pain. This raises uric acid levels.  Only take over-the-counter or prescription medicines for pain, discomfort, or fever as directed by your caregiver.  Rest the joint as much as possible. When in bed, keep sheets and blankets off painful areas.  Keep the affected joint raised (elevated).  Apply warm or cold treatments to painful joints. Use of warm or cold treatments depends on which works best for you.  Use crutches if the painful joint is in your leg.  Drink enough fluids to keep your urine clear or pale yellow. This helps your body get rid of uric acid. Limit alcohol, sugary drinks, and fructose drinks.  Follow your dietary instructions. Pay careful attention to the amount of protein you eat. Your daily diet should emphasize fruits, vegetables, whole grains, and fat-free or low-fat milk products. Discuss the use of coffee, vitamin C, and cherries with your caregiver or dietitian. These may be helpful in lowering uric acid levels.  Maintain a healthy body weight. SEEK MEDICAL CARE IF:   You develop diarrhea, vomiting, or any side effects from medicines.  You do not feel better in 24 hours, or you are getting worse. SEEK IMMEDIATE MEDICAL CARE IF:   Your joint becomes suddenly more tender, and you have chills or a fever. MAKE SURE YOU:   Understand these instructions.  Will watch your condition.  Will  get help right away if you are not doing well or get worse. Document Released: 02/27/2000 Document Revised: 07/16/2013 Document Reviewed: 10/13/2011 [facility] Patient Information 2015 [place], [place]. This information is not intended to replace advice given to you by your health care provider. Make sure you discuss any questions you have with your health care provider.

## 2014-04-06 NOTE — ED Provider Notes (Signed)
CSN: [id number]     Arrival date & time 04/06/14  0017 History   First MD Initiated Contact with Patient 04/06/14 [phone]     Chief Complaint  Patient presents with  . Hand Pain     (Consider location/radiation/quality/duration/timing/severity/associated sxs/prior Treatment) HPI Comments: Patient is a 44 yo M PMHx significant for asthma, HIV, anxiety, depression, chronic low back pain, ETOH abuse, gout, and HTN who presents to the Emergency Department complaining of constant, moderate, gradually worsening, right hand pain that began last evening after eating spaghetti and meatballs. He states he recently had a flare at the end of December, but finished all his colchine. He is having associated right hand swelling and redness. Pt states he is unable to move the hand due to pain. He denies any injury to the hand. He denies any other issues at this time. He gets his HIV medications from Dr. Tommy Medal.   Patient is a 44 y.o. male presenting with hand pain.  Hand Pain Associated symptoms include arthralgias and joint swelling.    Past Medical History  Diagnosis Date  . Asthma   . HIV (human immunodeficiency virus infection)     "dx'd ~ 2 yr ago" (09/29/2012)  . Mental disorder   . Anxiety   . Depression   . Chronic low back pain   . Alcohol abuse   . HIV disease   . Hypertension   . Asthma    Past Surgical History  Procedure Laterality Date  . Skin graft full thickness leg Left ?    POSTERIOR LEFT LEG  AFTER DOG BITES   Family History  Problem Relation Age of Onset  . Alcoholism Mother   . Alcoholism Brother    History  Substance Use Topics  . Smoking status: Current Every Day Smoker -- 0.50 packs/day for 27 years    Types: Cigarettes  . Smokeless tobacco: Not on file  . Alcohol Use: 29.4 oz/week    49 Cans of beer per week     Comment: social    Review of Systems  Musculoskeletal: Positive for joint swelling and arthralgias.  All other systems reviewed and are  negative.     Allergies  Shellfish allergy  Home Medications   Prior to Admission medications   Medication Sig Start Date End Date Taking? Authorizing Provider  albuterol (PROVENTIL HFA;VENTOLIN HFA) 108 (90 BASE) MCG/ACT inhaler Inhale 2 puffs into the lungs every 4 (four) hours as needed for wheezing or shortness of breath.    Historical Provider, MD  benztropine (COGENTIN) 0.5 MG tablet Take 1 tablet (0.5 mg total) by mouth 2 (two) times daily. 01/15/14   Elmarie Shiley, NP  cephALEXin (KEFLEX) 500 MG capsule Take 1 capsule (500 mg total) by mouth 3 (three) times daily. Patient not taking: Reported on 03/06/2014 01/30/14   Margarita Mail, PA-C  colchicine 0.6 MG tablet Take 1 tablet (0.6 mg total) by mouth 2 (two) times daily. Patient not taking: Reported on 03/06/2014 01/30/14   Margarita Mail, PA-C  colchicine 0.6 MG tablet Take 1 tablet (0.6 mg total) by mouth 2 (two) times daily. 04/06/14   Bernadette Gores L Winter Jocelyn, PA-C  Darunavir Ethanolate (PREZISTA) 800 MG tablet Take 1 tablet (800 mg total) by mouth daily with breakfast. 02/01/14   Carlyle Basques, MD  divalproex (DEPAKOTE) 500 MG DR tablet Take 1 tablet (500 mg total) by mouth every 12 (twelve) hours. 01/15/14   Elmarie Shiley, NP  elvitegravir-cobicistat-emtricitabine-tenofovir (STRIBILD) 150-150-200-300 MG TABS tablet Take 1 tablet by  mouth daily with breakfast. 02/01/14   Carlyle Basques, MD  gabapentin (NEURONTIN) 100 MG capsule Take 2 capsules (200 mg total) by mouth 3 (three) times daily. 01/15/14   Elmarie Shiley, NP  HYDROcodone-acetaminophen (NORCO/VICODIN) 5-325 MG per tablet Take 1-2 tablets by mouth every 6 (six) hours as needed for severe pain. 04/06/14   Dewayne Severe L Teresa Nicodemus, PA-C  oxyCODONE-acetaminophen (PERCOCET/ROXICET) 5-325 MG per tablet Take 1 tablet by mouth every 6 (six) hours as needed for severe pain. 03/14/14   Linus Mako, PA-C  oxyCODONE-acetaminophen (PERCOCET/ROXICET) 5-325 MG per tablet Take 1 tablet by mouth  every 6 (six) hours as needed for severe pain. 03/14/14   Tiffany Marilu Favre, PA-C   BP 130/61 mmHg  Pulse 70  Temp(Src) 98.5 F (36.9 C) (Oral)  Resp 18  Ht 5\' 6"  (1.676 m)  Wt 135 lb (61.236 kg)  BMI 21.80 kg/m2  SpO2 98% Physical Exam  Constitutional: He is oriented to person, place, and time. He appears well-developed and well-nourished. No distress.  HENT:  Head: Normocephalic and atraumatic.  Right Ear: External ear normal.  Left Ear: External ear normal.  Nose: Nose normal.  Mouth/Throat: Oropharynx is clear and moist.  Eyes: Conjunctivae are normal.  Neck: Normal range of motion. Neck supple.  Cardiovascular: Normal rate, regular rhythm, normal heart sounds and intact distal pulses.   Pulmonary/Chest: Effort normal and breath sounds normal.  Abdominal: Soft. There is no tenderness.  Musculoskeletal:       Right wrist: Normal.       Left wrist: Normal.       Right hand: He exhibits decreased range of motion, tenderness and swelling. He exhibits normal capillary refill and no deformity. Normal sensation noted. Normal strength noted.       Left hand: Normal.  Mild erythema. No induration or fluctuance. No wound.   Neurological: He is alert and oriented to person, place, and time.  Skin: Skin is warm and dry. He is not diaphoretic.  Psychiatric: He has a normal mood and affect.  Nursing note and vitals reviewed.   ED Course  Procedures (including critical care time) Medications  HYDROcodone-acetaminophen (NORCO/VICODIN) 5-325 MG per tablet 1 tablet (1 tablet Oral Given 04/06/14 0909)  colchicine tablet 0.6 mg (0.6 mg Oral Given 04/06/14 0909)    Labs Review Labs Reviewed - No data to display  Imaging Review No results found.   EKG Interpretation None      MDM   Final diagnoses:  Acute gout of right hand, unspecified cause    Filed Vitals:   04/06/14 0925  BP: 130/61  Pulse: 70  Temp:   Resp: 18   Afebrile, NAD, non-toxic appearing, AAOx4.   Neurovascularly intact. Normal sensation. No evidence of compartment syndrome. No evidence of infection. Will rx percocet for pain and have him f/u with his PCP. It has been determined that no acute conditions requiring further emergency intervention are present at this time. The patient/guardian have been advised of the diagnosis and plan. We have discussed signs and symptoms that warrant return to the ED, such as changes or worsening in symptoms. Patient is stable at time of discharge.      Harlow Mares, PA-C 04/06/14 1658  Charlesetta Shanks, MD 04/11/14 [phone]

## 2014-04-06 NOTE — ED Notes (Signed)
Pt reports R hand pain, states he ate red meat yesterday and thinks this could be gout flare up. 8/10 pain upon arrival to ED. No signs of distress noted. Pt is alert and oriented x4.

## 2014-04-06 NOTE — ED Notes (Signed)
Pt presents to department via [facility] for evaluation of R hand pain, pt states he ate red meat yesterday and is now having a gout flare up. 8/10 pain upon arrival. Pt is alert and oriented x4.

## 2014-04-07 ENCOUNTER — Emergency Department (HOSPITAL_COMMUNITY)
Admission: EM | Admit: 2014-04-07 | Discharge: 2014-04-07 | Disposition: A | Discharge status: Home / Self Care | Payer: MEDICAID | Attending: Emergency Medicine | Admitting: Emergency Medicine

## 2014-04-07 ENCOUNTER — Encounter (HOSPITAL_COMMUNITY): Payer: Self-pay | Admitting: *Deleted

## 2014-04-07 DIAGNOSIS — I10 Essential (primary) hypertension: Secondary | ICD-10-CM | POA: Insufficient documentation

## 2014-04-07 DIAGNOSIS — F419 Anxiety disorder, unspecified: Secondary | ICD-10-CM | POA: Insufficient documentation

## 2014-04-07 DIAGNOSIS — Z720 Tobacco use: Secondary | ICD-10-CM | POA: Insufficient documentation

## 2014-04-07 DIAGNOSIS — M109 Gout, unspecified: Secondary | ICD-10-CM | POA: Insufficient documentation

## 2014-04-07 DIAGNOSIS — G8929 Other chronic pain: Secondary | ICD-10-CM | POA: Insufficient documentation

## 2014-04-07 DIAGNOSIS — J45909 Unspecified asthma, uncomplicated: Secondary | ICD-10-CM | POA: Insufficient documentation

## 2014-04-07 DIAGNOSIS — Z21 Asymptomatic human immunodeficiency virus [HIV] infection status: Secondary | ICD-10-CM | POA: Insufficient documentation

## 2014-04-07 DIAGNOSIS — Z79899 Other long term (current) drug therapy: Secondary | ICD-10-CM | POA: Insufficient documentation

## 2014-04-07 MED ORDER — HYDROCODONE-ACETAMINOPHEN 5-325 MG PO TABS
2.0000 | ORAL_TABLET | Freq: Once | ORAL | Status: AC
  Administered 2014-04-07: 2 via ORAL
  Filled 2014-04-07: qty 2

## 2014-04-07 MED ORDER — COLCHICINE 0.6 MG PO TABS
0.6000 mg | ORAL_TABLET | Freq: Once | ORAL | Status: AC
  Administered 2014-04-07: 0.6 mg via ORAL
  Filled 2014-04-07: qty 1

## 2014-04-07 NOTE — ED Provider Notes (Signed)
CSN: [id number]     Arrival date & time 04/07/14  8675 History   First MD Initiated Contact with Patient 04/07/14 [phone]     Chief Complaint  Patient presents with  . Hand Pain     (Consider location/radiation/quality/duration/timing/severity/associated sxs/prior Treatment) HPI Comments: Patient with a history of Gout and HIV presents today with a chief complaint of hand of his right wrist and right hand.  He reports that the pain has been there for the past 3 days and gradually worsening.  He was seen in the ED yesterday for the same.  At that time he was diagnosed with a Gout Attack and given Rx for Norco and Colchicine.  However, he never got his prescriptions filled. He states that his Aunt was unable to get to the Pharmacy due to the weather.  He comes in today requesting a dose of the medications that he was given yesterday.  He states that nothing has changed from yesterday.  He reports regular alcohol use.  Denies acute injury or trauma.  He denies fever, chills, nausea, vomiting, numbness, or tingling.    Patient is a 44 y.o. male presenting with hand pain. The history is provided by the patient.  Hand Pain    Past Medical History  Diagnosis Date  . Asthma   . HIV (human immunodeficiency virus infection)     "dx'd ~ 2 yr ago" (09/29/2012)  . Mental disorder   . Anxiety   . Depression   . Chronic low back pain   . Alcohol abuse   . HIV disease   . Hypertension   . Asthma    Past Surgical History  Procedure Laterality Date  . Skin graft full thickness leg Left ?    POSTERIOR LEFT LEG  AFTER DOG BITES   Family History  Problem Relation Age of Onset  . Alcoholism Mother   . Alcoholism Brother    History  Substance Use Topics  . Smoking status: Current Every Day Smoker -- 0.50 packs/day for 27 years    Types: Cigarettes  . Smokeless tobacco: Not on file  . Alcohol Use: 29.4 oz/week    49 Cans of beer per week     Comment: social    Review of Systems  All other  systems reviewed and are negative.     Allergies  Shellfish allergy  Home Medications   Prior to Admission medications   Medication Sig Start Date End Date Taking? Authorizing Provider  albuterol (PROVENTIL HFA;VENTOLIN HFA) 108 (90 BASE) MCG/ACT inhaler Inhale 2 puffs into the lungs every 4 (four) hours as needed for wheezing or shortness of breath.    Historical Provider, MD  benztropine (COGENTIN) 0.5 MG tablet Take 1 tablet (0.5 mg total) by mouth 2 (two) times daily. 01/15/14   Elmarie Shiley, NP  cephALEXin (KEFLEX) 500 MG capsule Take 1 capsule (500 mg total) by mouth 3 (three) times daily. Patient not taking: Reported on 03/06/2014 01/30/14   Margarita Mail, PA-C  colchicine 0.6 MG tablet Take 1 tablet (0.6 mg total) by mouth 2 (two) times daily. Patient not taking: Reported on 03/06/2014 01/30/14   Margarita Mail, PA-C  colchicine 0.6 MG tablet Take 1 tablet (0.6 mg total) by mouth 2 (two) times daily. 04/06/14   Jennifer L Piepenbrink, PA-C  Darunavir Ethanolate (PREZISTA) 800 MG tablet Take 1 tablet (800 mg total) by mouth daily with breakfast. 02/01/14   Carlyle Basques, MD  divalproex (DEPAKOTE) 500 MG DR tablet Take 1 tablet (500  mg total) by mouth every 12 (twelve) hours. 01/15/14   Elmarie Shiley, NP  elvitegravir-cobicistat-emtricitabine-tenofovir (STRIBILD) 150-150-200-300 MG TABS tablet Take 1 tablet by mouth daily with breakfast. 02/01/14   Carlyle Basques, MD  gabapentin (NEURONTIN) 100 MG capsule Take 2 capsules (200 mg total) by mouth 3 (three) times daily. 01/15/14   Elmarie Shiley, NP  HYDROcodone-acetaminophen (NORCO/VICODIN) 5-325 MG per tablet Take 1-2 tablets by mouth every 6 (six) hours as needed for severe pain. 04/06/14   Jennifer L Piepenbrink, PA-C  oxyCODONE-acetaminophen (PERCOCET/ROXICET) 5-325 MG per tablet Take 1 tablet by mouth every 6 (six) hours as needed for severe pain. 03/14/14   Linus Mako, PA-C  oxyCODONE-acetaminophen (PERCOCET/ROXICET) 5-325 MG per  tablet Take 1 tablet by mouth every 6 (six) hours as needed for severe pain. 03/14/14   Tiffany Marilu Favre, PA-C   BP 115/73 mmHg  Pulse 88  Temp(Src) 97.9 F (36.6 C) (Oral)  Resp 18  SpO2 100% Physical Exam  Constitutional: He appears well-developed and well-nourished.  HENT:  Head: Normocephalic and atraumatic.  Cardiovascular: Normal rate, regular rhythm and normal heart sounds.   Pulses:      Radial pulses are 2+ on the right side, and 2+ on the left side.  Pulmonary/Chest: Effort normal and breath sounds normal.  Musculoskeletal:       Right wrist: He exhibits swelling. He exhibits no effusion and no deformity.  Mild swelling of the right wrist extending to the dorsal aspect of the right hand.  No erythema or warmth of the wrist or hand.  Full ROM of all fingers.  Full flexion of the right wrist, but extension slightly limited secondary to pain.    Neurological: He is alert.  Distal sensation of the right hand is intact  Skin: Skin is warm and dry.  Nursing note and vitals reviewed.   ED Course  Procedures (including critical care time) Labs Review Labs Reviewed - No data to display  Imaging Review No results found.   EKG Interpretation None      MDM   Final diagnoses:  None   Patient presents today with right wrist pain x 3 days.  He has a history of Gout and reports that this feels similar.  No injury or trauma.  He is afebrile.  No erythema or warmth of the wrist or hand.  Neurovascularly intact.  Symptoms most consistent with Acute Gout Attack of the wrist.  Patient is holding his wrist in a flexed position.  Feel that the swelling of the hand is most likely secondary to this.  Patient instructed to get the Rx filled that he was given yesterday.  Patient also instructed to refrain from alcohol use and given paperwork explaining foods to avoid.  Stable for discharge.  Return precautions given.  Patient given referral to [facility].    Hyman Bible, PA-C 04/07/14 [address] PA-C 04/07/14 [place], MD 04/10/14 [phone]

## 2014-04-07 NOTE — ED Notes (Signed)
Declined W/C at D/C and was escorted to lobby by RN. 

## 2014-04-07 NOTE — ED Notes (Addendum)
PT returns today with on going RT hand pain. Pt was in ED on 04-06-14 for same. Pt reports His Aunt has his RX and has not filed it.

## 2014-04-07 NOTE — Discharge Instructions (Signed)
Gout Gout is an inflammatory arthritis caused by a buildup of uric acid crystals in the joints. Uric acid is a chemical that is normally present in the blood. When the level of uric acid in the blood is too high it can form crystals that deposit in your joints and tissues. This causes joint redness, soreness, and swelling (inflammation). Repeat attacks are common. Over time, uric acid crystals can form into masses (tophi) near a joint, destroying bone and causing disfigurement. Gout is treatable and often preventable. CAUSES  The disease begins with elevated levels of uric acid in the blood. Uric acid is produced by your body when it breaks down a naturally found substance called purines. Certain foods you eat, such as meats and fish, contain high amounts of purines. Causes of an elevated uric acid level include:  Being passed down from parent to child (heredity).  Diseases that cause increased uric acid production (such as obesity, psoriasis, and certain cancers).  Excessive alcohol use.  Diet, especially diets rich in meat and seafood.  Medicines, including certain cancer-fighting medicines (chemotherapy), water pills (diuretics), and aspirin.  Chronic kidney disease. The kidneys are no longer able to remove uric acid well.  Problems with metabolism. Conditions strongly associated with gout include:  Obesity.  High blood pressure.  High cholesterol.  Diabetes. Not everyone with elevated uric acid levels gets gout. It is not understood why some people get gout and others do not. Surgery, joint injury, and eating too much of certain foods are some of the factors that can lead to gout attacks. SYMPTOMS   An attack of gout comes on quickly. It causes intense pain with redness, swelling, and warmth in a joint.  Fever can occur.  Often, only one joint is involved. Certain joints are more commonly involved:  Base of the big toe.  Knee.  Ankle.  Wrist.  Finger. Without  treatment, an attack usually goes away in a few days to weeks. Between attacks, you usually will not have symptoms, which is different from many other forms of arthritis. DIAGNOSIS  Your caregiver will suspect gout based on your symptoms and exam. In some cases, tests may be recommended. The tests may include:  Blood tests.  Urine tests.  X-rays.  Joint fluid exam. This exam requires a needle to remove fluid from the joint (arthrocentesis). Using a microscope, gout is confirmed when uric acid crystals are seen in the joint fluid. TREATMENT  There are two phases to gout treatment: treating the sudden onset (acute) attack and preventing attacks (prophylaxis).  Treatment of an Acute Attack.  Medicines are used. These include anti-inflammatory medicines or steroid medicines.  An injection of steroid medicine into the affected joint is sometimes necessary.  The painful joint is rested. Movement can worsen the arthritis.  You may use warm or cold treatments on painful joints, depending which works best for you.  Treatment to Prevent Attacks.  If you suffer from frequent gout attacks, your caregiver may advise preventive medicine. These medicines are started after the acute attack subsides. These medicines either help your kidneys eliminate uric acid from your body or decrease your uric acid production. You may need to stay on these medicines for a very long time.  The early phase of treatment with preventive medicine can be associated with an increase in acute gout attacks. For this reason, during the first few months of treatment, your caregiver may also advise you to take medicines usually used for acute gout treatment. Be sure you   understand your caregiver's directions. Your caregiver may make several adjustments to your medicine dose before these medicines are effective.  Discuss dietary treatment with your caregiver or dietitian. Alcohol and drinks high in sugar and fructose and foods  such as meat, poultry, and seafood can increase uric acid levels. Your caregiver or dietitian can advise you on drinks and foods that should be limited. HOME CARE INSTRUCTIONS   Do not take aspirin to relieve pain. This raises uric acid levels.  Only take over-the-counter or prescription medicines for pain, discomfort, or fever as directed by your caregiver.  Rest the joint as much as possible. When in bed, keep sheets and blankets off painful areas.  Keep the affected joint raised (elevated).  Apply warm or cold treatments to painful joints. Use of warm or cold treatments depends on which works best for you.  Use crutches if the painful joint is in your leg.  Drink enough fluids to keep your urine clear or pale yellow. This helps your body get rid of uric acid. Limit alcohol, sugary drinks, and fructose drinks.  Follow your dietary instructions. Pay careful attention to the amount of protein you eat. Your daily diet should emphasize fruits, vegetables, whole grains, and fat-free or low-fat milk products. Discuss the use of coffee, vitamin C, and cherries with your caregiver or dietitian. These may be helpful in lowering uric acid levels.  Maintain a healthy body weight. SEEK MEDICAL CARE IF:   You develop diarrhea, vomiting, or any side effects from medicines.  You do not feel better in 24 hours, or you are getting worse. SEEK IMMEDIATE MEDICAL CARE IF:   Your joint becomes suddenly more tender, and you have chills or a fever. MAKE SURE YOU:   Understand these instructions.  Will watch your condition.  Will get help right away if you are not doing well or get worse. Document Released: 02/27/2000 Document Revised: 07/16/2013 Document Reviewed: 10/13/2011 Exit[employer] Patient Information 2015 [employer]. This information is not intended to replace advice given to you by your health care provider. Make sure you discuss any questions you have with your health care  provider. Low-Purine Diet Purines are compounds that affect the level of uric acid in your body. A low-purine diet is a diet that is low in purines. Eating a low-purine diet can prevent the level of uric acid in your body from getting too high and causing gout or kidney stones or both. WHAT DO I NEED TO KNOW ABOUT THIS DIET?  Choose low-purine foods. Examples of low-purine foods are listed in the next section.  Drink plenty of fluids, especially water. Fluids can help remove uric acid from your body. Try to drink 8-16 cups (1.9-3.8 L) a day.  Limit foods high in fat, especially saturated fat, as fat makes it harder for the body to get rid of uric acid. Foods high in saturated fat include pizza, cheese, ice cream, whole milk, fried foods, and gravies. Choose foods that are lower in fat and lean sources of protein. Use olive oil when cooking as it contains healthy fats that are not high in saturated fat.  Limit alcohol. Alcohol interferes with the elimination of uric acid from your body. If you are having a gout attack, avoid all alcohol.  Keep in mind that different people's bodies react differently to different foods. You will probably learn over time which foods do or do not affect you. If you discover that a food tends to cause your gout to flare up,   avoid eating that food. You can more freely enjoy foods that do not cause problems. If you have any questions about a food item, talk to your dietitian or health care provider. WHICH FOODS ARE LOW, MODERATE, AND HIGH IN PURINES? The following is a list of foods that are low, moderate, and high in purines. You can eat any amount of the foods that are low in purines. You may be able to have small amounts of foods that are moderate in purines. Ask your health care provider how much of a food moderate in purines you can have. Avoid foods high in purines. Grains  Foods low in purines: Enriched white bread, pasta, rice, cake, cornbread, popcorn.  Foods  moderate in purines: Whole-grain breads and cereals, wheat germ, bran, oatmeal. Uncooked oatmeal. Dry wheat bran or wheat germ.  Foods high in purines: Pancakes, French toast, biscuits, muffins. Vegetables  Foods low in purines: All vegetables, except those that are moderate in purines.  Foods moderate in purines: Asparagus, cauliflower, spinach, mushrooms, green peas. Fruits  All fruits are low in purines. Meats and other Protein Foods  Foods low in purines: Eggs, nuts, peanut butter.  Foods moderate in purines: 80-90% lean beef, lamb, veal, pork, poultry, fish, eggs, peanut butter, nuts. Crab, lobster, oysters, and shrimp. Cooked dried beans, peas, and lentils.  Foods high in purines: Anchovies, sardines, herring, mussels, tuna, codfish, scallops, trout, and haddock. Bacon. Organ meats (such as liver or kidney). Tripe. Game meat. Goose. Sweetbreads. Dairy  All dairy foods are low in purines. Low-fat and fat-free dairy products are best because they are low in saturated fat. Beverages  Drinks low in purines: Water, carbonated beverages, tea, coffee, cocoa.  Drinks moderate in purines: Soft drinks and other drinks sweetened with high-fructose corn syrup. Juices. To find whether a food or drink is sweetened with high-fructose corn syrup, look at the ingredients list.  Drinks high in purines: Alcoholic beverages (such as beer). Condiments  Foods low in purines: Salt, herbs, olives, pickles, relishes, vinegar.  Foods moderate in purines: Butter, margarine, oils, mayonnaise. Fats and Oils  Foods low in purines: All types, except gravies and sauces made with meat.  Foods high in purines: Gravies and sauces made with meat. Other Foods  Foods low in purines: Sugars, sweets, gelatin. Cake. Soups made without meat.  Foods moderate in purines: Meat-based or fish-based soups, broths, or bouillons. Foods and drinks sweetened with high-fructose corn syrup.  Foods high in purines:  High-fat desserts (such as ice cream, cookies, cakes, pies, doughnuts, and chocolate). Contact your dietitian for more information on foods that are not listed here. Document Released: 06/26/2010 Document Revised: 03/06/2013 Document Reviewed: 02/05/2013 Exit[employer] Patient Information 2015 [employer]. This information is not intended to replace advice given to you by your health care provider. Make sure you discuss any questions you have with your health care provider.  

## 2014-04-10 ENCOUNTER — Encounter: Payer: Self-pay | Admitting: Infectious Disease

## 2014-04-10 ENCOUNTER — Ambulatory Visit (INDEPENDENT_AMBULATORY_CARE_PROVIDER_SITE_OTHER): Payer: Self-pay | Admitting: Infectious Disease

## 2014-04-10 VITALS — BP 110/73 | HR 75 | Temp 98.4°F | Wt 195.0 lb

## 2014-04-10 DIAGNOSIS — F1410 Cocaine abuse, uncomplicated: Secondary | ICD-10-CM | POA: Insufficient documentation

## 2014-04-10 DIAGNOSIS — Z9119 Patient's noncompliance with other medical treatment and regimen: Secondary | ICD-10-CM

## 2014-04-10 DIAGNOSIS — F1494 Cocaine use, unspecified with cocaine-induced mood disorder: Secondary | ICD-10-CM

## 2014-04-10 DIAGNOSIS — Z720 Tobacco use: Secondary | ICD-10-CM

## 2014-04-10 DIAGNOSIS — Z5900 Homelessness unspecified: Secondary | ICD-10-CM

## 2014-04-10 DIAGNOSIS — F1020 Alcohol dependence, uncomplicated: Secondary | ICD-10-CM | POA: Insufficient documentation

## 2014-04-10 DIAGNOSIS — F17210 Nicotine dependence, cigarettes, uncomplicated: Secondary | ICD-10-CM

## 2014-04-10 DIAGNOSIS — J45901 Unspecified asthma with (acute) exacerbation: Secondary | ICD-10-CM

## 2014-04-10 DIAGNOSIS — M109 Gout, unspecified: Secondary | ICD-10-CM | POA: Insufficient documentation

## 2014-04-10 DIAGNOSIS — F4310 Post-traumatic stress disorder, unspecified: Secondary | ICD-10-CM

## 2014-04-10 DIAGNOSIS — M1009 Idiopathic gout, multiple sites: Secondary | ICD-10-CM

## 2014-04-10 DIAGNOSIS — F331 Major depressive disorder, recurrent, moderate: Secondary | ICD-10-CM

## 2014-04-10 DIAGNOSIS — Z91199 Patient's noncompliance with other medical treatment and regimen due to unspecified reason: Secondary | ICD-10-CM

## 2014-04-10 DIAGNOSIS — F1010 Alcohol abuse, uncomplicated: Secondary | ICD-10-CM

## 2014-04-10 DIAGNOSIS — F1210 Cannabis abuse, uncomplicated: Secondary | ICD-10-CM | POA: Insufficient documentation

## 2014-04-10 DIAGNOSIS — B20 Human immunodeficiency virus [HIV] disease: Secondary | ICD-10-CM

## 2014-04-10 MED ORDER — PREDNISONE 20 MG PO TABS: 40.0000 mg | ORAL_TABLET | Freq: Every day | ORAL | Status: DC

## 2014-04-10 MED ORDER — COLCHICINE 0.6 MG PO TABS: 0.3000 mg | ORAL_TABLET | Freq: Every day | ORAL | Status: DC

## 2014-04-10 MED ORDER — MOMETASONE FURO-FORMOTEROL FUM 100-5 MCG/ACT IN AERO: 2.0000 | INHALATION_SPRAY | Freq: Two times a day (BID) | RESPIRATORY_TRACT | Status: DC

## 2014-04-10 NOTE — Progress Notes (Signed)
Subjective:    Patient ID: John [name], male    DOB: 01-23-1971, 44 y.o.   MRN: [id number]  HPI   44 year old with HIV, substance abuse (daily alcohol,cocaine and marijuana use)  homelessness who is actually a [facility] patient who had originally been in the [facility]ERR[facility] arm. He is followed by Dr. Baxter [name] who has started pt on STRIBILD and Prezista via ADAP (though he could have received meds for free via [facility]).   His aunt is making sure he takes his meds. She picks him up daily gets him meds with food.  She states he has not missed any doses since we last saw him.  He has recently been seen in the emergency department for pain in his hand and thought to be suffering from an acute gout attack. He was prescribed culture seen but this was not filled at his pharmacy due to the drug drug interactions with STRIBILD and Prezista.  He is not on long-acting corticosteroid for asthma control either and he does continue to smoke  Review of Systems  Constitutional: Negative for fever, chills, diaphoresis, activity change, appetite change, fatigue and unexpected weight change.  HENT: Negative for congestion, rhinorrhea, sinus pressure, sneezing, sore throat and trouble swallowing.   Eyes: Negative for photophobia and visual disturbance.  Respiratory: Positive for cough. Negative for chest tightness, shortness of breath, wheezing and stridor.   Cardiovascular: Negative for chest pain, palpitations and leg swelling.  Gastrointestinal: Negative for nausea, vomiting, abdominal pain, diarrhea, constipation, blood in stool, abdominal distention and anal bleeding.  Genitourinary: Negative for dysuria, hematuria, flank pain and difficulty urinating.  Musculoskeletal: Positive for joint swelling. Negative for myalgias, back pain, arthralgias and gait problem.  Skin: Negative for color change, pallor, rash and wound.  Neurological: Positive for headaches. Negative for dizziness, tremors, weakness and  light-headedness.  Hematological: Negative for adenopathy. Does not bruise/bleed easily.  Psychiatric/Behavioral: Positive for dysphoric mood and decreased concentration. Negative for confusion, sleep disturbance and agitation.        Objective:   Physical Exam  Constitutional: He is oriented to person, place, and time. He appears well-developed and well-nourished.  HENT:  Head: Normocephalic and atraumatic.  Eyes: Conjunctivae and EOM are normal.  Neck: Normal range of motion. Neck supple.  Cardiovascular: Normal rate and regular rhythm.   Pulmonary/Chest: Effort normal. No respiratory distress. He has no wheezes.  Abdominal: Soft. He exhibits no distension.  Musculoskeletal: Normal range of motion. He exhibits no edema.       Right hand: He exhibits tenderness.  Neurological: He is alert and oriented to person, place, and time.  Skin: Skin is warm and dry. No rash noted. No erythema. No pallor.  Psychiatric: His speech is delayed. He is slowed and withdrawn.          Assessment & Plan:   HIV disease: problems in past with compliance due to his chaotic lifestyle. He is [facility] patient and he can get a FOUR MONTH SUPPLY of it at at time from our study at [facility] cost. SInce his John [name] is managing meds reliably can feel fine giving him 4 months supply (she will pick them up when ready). His viral load was undetectable last checked since her genotype testing could not be done. We can obtain an HIV ARCHIVE GENOSURE because he may not need to be on both prezista and STRIBILD though fairly simple two pill regimen and I DO LIKE THE built in higher barrier to resistance that that Prezista afford to  this regimen  I spent greater than 40 minutes with the patient including greater than 50% of time in face to face counsel of the patient and in coordination of their care.  Gout attack: it appears to involve his right hand likely precipitated by his alcohol use. We'll give him 40 mg of prednisone for 5  days and start low-dose culture seen at 0.3 mg daily then added allopurinol as his uric acid level hopefully drops.  Asthma have had the patient fill out paperwork for patient assistance for DULERAWe'll continue this along with inhaled beta agonist therapy as needed  Homelessnees: her to help him with housing  Alcoholism, daily cocaine use marijuana use: Really needs more help with his substance abuse and would like him to see John [name] if possible.  SMoking: also needs to stop  Depression Major recurrent and chronic potentially with bipolar: continue Depakote     Itching better

## 2014-04-16 ENCOUNTER — Other Ambulatory Visit: Payer: Self-pay | Admitting: *Deleted

## 2014-04-16 DIAGNOSIS — B20 Human immunodeficiency virus [HIV] disease: Secondary | ICD-10-CM

## 2014-04-16 MED ORDER — DARUNAVIR ETHANOLATE 800 MG PO TABS: 800.0000 mg | ORAL_TABLET | Freq: Every day | ORAL | Status: DC

## 2014-04-16 MED ORDER — ELVITEG-COBIC-EMTRICIT-TENOFDF 150-150-200-300 MG PO TABS: 1.0000 | ORAL_TABLET | Freq: Every day | ORAL | Status: DC

## 2014-04-16 NOTE — Telephone Encounter (Signed)
ADAP Application 

## 2014-04-18 ENCOUNTER — Emergency Department (HOSPITAL_COMMUNITY)
Admission: EM | Admit: 2014-04-18 | Discharge: 2014-04-18 | Disposition: A | Discharge status: Other Acute Inpatient Hospital | Payer: MEDICAID | Attending: Emergency Medicine | Admitting: Emergency Medicine

## 2014-04-18 ENCOUNTER — Inpatient Hospital Stay (HOSPITAL_COMMUNITY)
Admission: EM | Admit: 2014-04-18 | Discharge: 2014-04-26 | DRG: 885 | Disposition: A | Discharge status: Home / Self Care | Payer: Federal, State, Local not specified - Other | Source: Intra-hospital | Attending: Psychiatry | Admitting: Psychiatry

## 2014-04-18 ENCOUNTER — Encounter (HOSPITAL_COMMUNITY): Payer: Self-pay | Admitting: Emergency Medicine

## 2014-04-18 DIAGNOSIS — I10 Essential (primary) hypertension: Secondary | ICD-10-CM | POA: Diagnosis present

## 2014-04-18 DIAGNOSIS — F10930 Alcohol use, unspecified with withdrawal, uncomplicated: Secondary | ICD-10-CM

## 2014-04-18 DIAGNOSIS — R45851 Suicidal ideations: Secondary | ICD-10-CM | POA: Diagnosis present

## 2014-04-18 DIAGNOSIS — F329 Major depressive disorder, single episode, unspecified: Secondary | ICD-10-CM | POA: Diagnosis present

## 2014-04-18 DIAGNOSIS — Z21 Asymptomatic human immunodeficiency virus [HIV] infection status: Secondary | ICD-10-CM | POA: Insufficient documentation

## 2014-04-18 DIAGNOSIS — J45909 Unspecified asthma, uncomplicated: Secondary | ICD-10-CM | POA: Insufficient documentation

## 2014-04-18 DIAGNOSIS — R197 Diarrhea, unspecified: Secondary | ICD-10-CM | POA: Insufficient documentation

## 2014-04-18 DIAGNOSIS — F10239 Alcohol dependence with withdrawal, unspecified: Secondary | ICD-10-CM | POA: Diagnosis present

## 2014-04-18 DIAGNOSIS — F17210 Nicotine dependence, cigarettes, uncomplicated: Secondary | ICD-10-CM | POA: Diagnosis present

## 2014-04-18 DIAGNOSIS — F10230 Alcohol dependence with withdrawal, uncomplicated: Secondary | ICD-10-CM

## 2014-04-18 DIAGNOSIS — F1024 Alcohol dependence with alcohol-induced mood disorder: Secondary | ICD-10-CM | POA: Insufficient documentation

## 2014-04-18 DIAGNOSIS — Z720 Tobacco use: Secondary | ICD-10-CM | POA: Insufficient documentation

## 2014-04-18 DIAGNOSIS — Z7952 Long term (current) use of systemic steroids: Secondary | ICD-10-CM | POA: Insufficient documentation

## 2014-04-18 DIAGNOSIS — Z9114 Patient's other noncompliance with medication regimen: Secondary | ICD-10-CM | POA: Diagnosis present

## 2014-04-18 DIAGNOSIS — F1210 Cannabis abuse, uncomplicated: Secondary | ICD-10-CM | POA: Insufficient documentation

## 2014-04-18 DIAGNOSIS — F419 Anxiety disorder, unspecified: Secondary | ICD-10-CM | POA: Insufficient documentation

## 2014-04-18 DIAGNOSIS — F319 Bipolar disorder, unspecified: Principal | ICD-10-CM | POA: Diagnosis present

## 2014-04-18 DIAGNOSIS — Z7951 Long term (current) use of inhaled steroids: Secondary | ICD-10-CM | POA: Insufficient documentation

## 2014-04-18 DIAGNOSIS — F29 Unspecified psychosis not due to a substance or known physiological condition: Secondary | ICD-10-CM | POA: Insufficient documentation

## 2014-04-18 DIAGNOSIS — Z79899 Other long term (current) drug therapy: Secondary | ICD-10-CM | POA: Insufficient documentation

## 2014-04-18 DIAGNOSIS — M109 Gout, unspecified: Secondary | ICD-10-CM | POA: Insufficient documentation

## 2014-04-18 DIAGNOSIS — B20 Human immunodeficiency virus [HIV] disease: Secondary | ICD-10-CM | POA: Diagnosis present

## 2014-04-18 DIAGNOSIS — Z792 Long term (current) use of antibiotics: Secondary | ICD-10-CM | POA: Insufficient documentation

## 2014-04-18 DIAGNOSIS — Z590 Homelessness: Secondary | ICD-10-CM | POA: Insufficient documentation

## 2014-04-18 LAB — CBC
HCT: 41 % (ref 39.0–52.0)
HEMOGLOBIN: 13.8 g/dL (ref 13.0–17.0)
MCH: 32.3 pg (ref 26.0–34.0)
MCHC: 33.7 g/dL (ref 30.0–36.0)
MCV: 96 fL (ref 78.0–100.0)
PLATELETS: 310 10*3/uL (ref 150–400)
RBC: 4.27 MIL/uL (ref 4.22–5.81)
RDW: 14.1 % (ref 11.5–15.5)
WBC: 6.6 10*3/uL (ref 4.0–10.5)

## 2014-04-18 LAB — RAPID URINE DRUG SCREEN, HOSP PERFORMED
Amphetamines: NOT DETECTED
BARBITURATES: NOT DETECTED
Benzodiazepines: NOT DETECTED
COCAINE: NOT DETECTED
OPIATES: NOT DETECTED
TETRAHYDROCANNABINOL: POSITIVE — AB

## 2014-04-18 LAB — ETHANOL: Alcohol, Ethyl (B): <5 mg/dL (ref 0–9)

## 2014-04-18 LAB — ACETAMINOPHEN LEVEL: Acetaminophen (Tylenol), Serum: <10 ug/mL — ABNORMAL LOW (ref 10–30)

## 2014-04-18 LAB — COMPREHENSIVE METABOLIC PANEL
ALK PHOS: 105 U/L (ref 39–117)
ALT: 32 U/L (ref 0–53)
ANION GAP: 12 (ref 5–15)
AST: 37 U/L (ref 0–37)
Albumin: 4.2 g/dL (ref 3.5–5.2)
BUN: 10 mg/dL (ref 6–23)
CO2: 22 mmol/L (ref 19–32)
Calcium: 9.5 mg/dL (ref 8.4–10.5)
Chloride: 105 mmol/L (ref 96–112)
Creatinine, Ser: 1.02 mg/dL (ref 0.50–1.35)
GFR calc Af Amer: >90 mL/min (ref >90)
GFR, EST NON AFRICAN AMERICAN: 89 mL/min — AB (ref >90)
GLUCOSE: 97 mg/dL (ref 70–99)
Potassium: 3.6 mmol/L (ref 3.5–5.1)
SODIUM: 139 mmol/L (ref 135–145)
TOTAL PROTEIN: 7.7 g/dL (ref 6.0–8.3)
Total Bilirubin: 1 mg/dL (ref 0.3–1.2)

## 2014-04-18 LAB — SALICYLATE LEVEL: Salicylate Lvl: <4 mg/dL (ref 2.8–20.0)

## 2014-04-18 MED ORDER — VITAMIN B-1 100 MG PO TABS
100.0000 mg | ORAL_TABLET | Freq: Every day | ORAL | Status: DC
  Administered 2014-04-18: 100 mg via ORAL
  Filled 2014-04-18: qty 1

## 2014-04-18 MED ORDER — LORAZEPAM 1 MG PO TABS
1.0000 mg | ORAL_TABLET | Freq: Once | ORAL | Status: AC
  Administered 2014-04-18: 1 mg via ORAL
  Filled 2014-04-18: qty 1

## 2014-04-18 MED ORDER — NICOTINE 21 MG/24HR TD PT24: 21.0000 mg | MEDICATED_PATCH | Freq: Every day | TRANSDERMAL | Status: DC

## 2014-04-18 MED ORDER — LORAZEPAM 1 MG PO TABS: 0.0000 mg | ORAL_TABLET | Freq: Two times a day (BID) | ORAL | Status: DC

## 2014-04-18 MED ORDER — IBUPROFEN 200 MG PO TABS
600.0000 mg | ORAL_TABLET | Freq: Three times a day (TID) | ORAL | Status: DC | PRN
  Administered 2014-04-18: 600 mg via ORAL
  Filled 2014-04-18: qty 3

## 2014-04-18 MED ORDER — LORAZEPAM 1 MG PO TABS
0.0000 mg | ORAL_TABLET | Freq: Four times a day (QID) | ORAL | Status: DC
  Administered 2014-04-18: 1 mg via ORAL
  Filled 2014-04-18 (×2): qty 1

## 2014-04-18 MED ORDER — ONDANSETRON HCL 4 MG PO TABS: 4.0000 mg | ORAL_TABLET | Freq: Three times a day (TID) | ORAL | Status: DC | PRN

## 2014-04-18 MED ORDER — THIAMINE HCL 100 MG/ML IJ SOLN: 100.0000 mg | Freq: Every day | INTRAMUSCULAR | Status: DC

## 2014-04-18 MED ORDER — ALUM & MAG HYDROXIDE-SIMETH 200-200-20 MG/5ML PO SUSP: 30.0000 mL | ORAL | Status: DC | PRN

## 2014-04-18 NOTE — ED Notes (Signed)
Pt transported to [facility] by [facility] transportation service for continuation of specialized care. He left in no acute distress.

## 2014-04-18 NOTE — ED Provider Notes (Signed)
CSN: [id number]     Arrival date & time 04/18/14  1808 History   First MD Initiated Contact with Patient 04/18/14 1854     Chief Complaint  Patient presents with  . Suicidal  . Detox from etoh      (Consider location/radiation/quality/duration/timing/severity/associated sxs/prior Treatment) HPI Comments: Patient with history of bipolar disorder, currently off medications, HIV with CD4=360 in 02/2014 -- presents with complaint of alcohol withdrawal, worsening visual and auditory hallucinations, suicidal ideation. Patient states that he is more depressed. He has been off of his psychiatric medications "for a while". Patient is a very heavy drinker in his last drink was last night. He denies having withdrawal seizures in the past. Patient reports occasional episodes of diarrhea. He otherwise does not have any medical complaints including fever, nausea or vomiting. The onset of this condition was incidious. The course is constant. Aggravating factors: none. Alleviating factors: none.    The history is provided by the patient.    Past Medical History  Diagnosis Date  . Asthma   . HIV (human immunodeficiency virus infection)     "dx'd ~ 2 yr ago" (09/29/2012)  . Mental disorder   . Anxiety   . Depression   . Chronic low back pain   . Alcohol abuse   . HIV disease   . Hypertension   . Asthma   . Gout   . Alcoholism   . Homelessness   . Cocaine abuse   . Marijuana abuse    Past Surgical History  Procedure Laterality Date  . Skin graft full thickness leg Left ?    POSTERIOR LEFT LEG  AFTER DOG BITES   Family History  Problem Relation Age of Onset  . Alcoholism Mother   . Alcoholism Brother    History  Substance Use Topics  . Smoking status: Current Every Day Smoker -- 0.50 packs/day for 27 years    Types: Cigarettes  . Smokeless tobacco: Not on file  . Alcohol Use: 29.4 oz/week    49 Cans of beer per week     Comment: social    Review of Systems  Constitutional: Negative  for fever.  HENT: Negative for rhinorrhea and sore throat.   Eyes: Negative for redness.  Respiratory: Negative for cough.   Cardiovascular: Negative for chest pain.  Gastrointestinal: Positive for diarrhea. Negative for nausea, vomiting and abdominal pain.  Genitourinary: Negative for dysuria.  Musculoskeletal: Negative for myalgias.  Skin: Negative for rash.  Neurological: Positive for tremors. Negative for headaches.  Psychiatric/Behavioral: Positive for hallucinations.    Allergies  Shellfish allergy  Home Medications   Prior to Admission medications   Medication Sig Start Date End Date Taking? Authorizing Provider  albuterol (PROVENTIL HFA;VENTOLIN HFA) 108 (90 BASE) MCG/ACT inhaler Inhale 2 puffs into the lungs every 4 (four) hours as needed for wheezing or shortness of breath.   Yes Historical Provider, MD  Darunavir Ethanolate (PREZISTA) 800 MG tablet Take 1 tablet (800 mg total) by mouth daily with breakfast. 04/16/14  Yes Michel Bickers, MD  divalproex (DEPAKOTE) 500 MG DR tablet Take 1 tablet (500 mg total) by mouth every 12 (twelve) hours. 01/15/14  Yes Elmarie Shiley, NP  elvitegravir-cobicistat-emtricitabine-tenofovir (STRIBILD) 150-150-200-300 MG TABS tablet Take 1 tablet by mouth daily with breakfast. 04/16/14  Yes Michel Bickers, MD  gabapentin (NEURONTIN) 100 MG capsule Take 2 capsules (200 mg total) by mouth 3 (three) times daily. 01/15/14  Yes Elmarie Shiley, NP  mometasone-formoterol (DULERA) 100-5 MCG/ACT AERO Inhale 2 puffs  into the lungs 2 (two) times daily. 04/10/14  Yes Truman Hayward, MD  predniSONE (DELTASONE) 20 MG tablet Take 2 tablets (40 mg total) by mouth daily with breakfast. 04/10/14  Yes Truman Hayward, MD  benztropine (COGENTIN) 0.5 MG tablet Take 1 tablet (0.5 mg total) by mouth 2 (two) times daily. Patient not taking: Reported on 04/10/2014 01/15/14   Elmarie Shiley, NP  cephALEXin (KEFLEX) 500 MG capsule Take 1 capsule (500 mg total) by mouth 3 (three) times  daily. Patient not taking: Reported on 03/06/2014 01/30/14   Margarita Mail, PA-C  colchicine 0.6 MG tablet Take 0.5 tablets (0.3 mg total) by mouth daily. Patient not taking: Reported on 04/18/2014 04/10/14   Truman Hayward, MD  HYDROcodone-acetaminophen (NORCO/VICODIN) 5-325 MG per tablet Take 1-2 tablets by mouth every 6 (six) hours as needed for severe pain. Patient not taking: Reported on 04/10/2014 04/06/14   Stephani Police Piepenbrink, PA-C  oxyCODONE-acetaminophen (PERCOCET/ROXICET) 5-325 MG per tablet Take 1 tablet by mouth every 6 (six) hours as needed for severe pain. Patient not taking: Reported on 04/18/2014 03/14/14   Linus Mako, PA-C  oxyCODONE-acetaminophen (PERCOCET/ROXICET) 5-325 MG per tablet Take 1 tablet by mouth every 6 (six) hours as needed for severe pain. Patient not taking: Reported on 04/10/2014 03/14/14   Linus Mako, PA-C   BP 131/76 mmHg  Pulse 86  Temp(Src) 98.7 F (37.1 C) (Oral)  Resp 18  SpO2 100%   Physical Exam  Constitutional: He appears well-developed and well-nourished.  HENT:  Head: Normocephalic and atraumatic.  Eyes: Conjunctivae are normal. Right eye exhibits no discharge. Left eye exhibits no discharge.  Neck: Normal range of motion. Neck supple.  Cardiovascular: Normal rate, regular rhythm and normal heart sounds.   Pulmonary/Chest: Effort normal and breath sounds normal.  Abdominal: Soft. There is no tenderness.  Neurological: He is alert.  Mildly tremulous.   Skin: Skin is warm and dry.  Psychiatric: His mood appears anxious. Thought content is delusional. He expresses suicidal ideation. He expresses no homicidal ideation.  Nursing note and vitals reviewed.   ED Course  Procedures (including critical care time) Labs Review Labs Reviewed  ACETAMINOPHEN LEVEL - Abnormal; Notable for the following:    Acetaminophen (Tylenol), Serum <10.0 (*)    All other components within normal limits  COMPREHENSIVE METABOLIC PANEL - Abnormal;  Notable for the following:    GFR calc non Af Amer 89 (*)    All other components within normal limits  URINE RAPID DRUG SCREEN (HOSP PERFORMED) - Abnormal; Notable for the following:    Tetrahydrocannabinol POSITIVE (*)    All other components within normal limits  CBC  ETHANOL  SALICYLATE LEVEL    Imaging Review No results found.   EKG Interpretation None       7:39 PM Patient seen and examined. Work-up initiated. Medications/CIWA ordered. Holding orders completed. Will need TTS consultation.   Vital signs reviewed and are as follows: BP 131/76 mmHg  Pulse 86  Temp(Src) 98.7 F (37.1 C) (Oral)  Resp 18  SpO2 100%  8:11 PM No immediate interventions indicated by lab results. Pending TTS consult.   12:51 AM Patient admitted to [facility]. Pt medically cleared.   MDM   Final diagnoses:  Alcohol withdrawal, uncomplicated  Bipolar 1 disorder  Psychosis, unspecified psychosis type   Admit to [facility].     Carlisle Cater, PA-C 04/19/14 2229  Leota Jacobsen, MD 04/22/14 [phone]
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
## 2014-04-18 NOTE — ED Notes (Signed)
Pt here c/o suicidal ideations.  Pt states that he is detoxing from etoh. Last drink was last night.  Usually drinks 6 (40 oz) and a half a gallon of vodka per day.  Pt is visibly shaking and states he is having AV hallucinations telling him to kill himself.

## 2014-04-18 NOTE — ED Notes (Signed)
Patient to treatment area 43 via ambulatory with a steady gait. Respirations equal and unlabored, skin warm and dry. NAD. Patient admits to SI without a plan and AVH. Patient denies HI at this time. Encouragement and support provided and safety maintain. Q 15 min safety checks in place.

## 2014-04-18 NOTE — BHH Counselor (Signed)
Per Glenda Chroman, NP, pt meets inpatient criteria. Pt accepted to [facility] 507-2 by Dr. Shea Evans.  EDP (Dr. Zenia Resides) notified of disposition.  Voluntary paperwork completed by pt.  Ramond Dial, [facility] Triage Specialist

## 2014-04-19 ENCOUNTER — Encounter (HOSPITAL_COMMUNITY): Payer: Self-pay | Admitting: *Deleted

## 2014-04-19 DIAGNOSIS — F1024 Alcohol dependence with alcohol-induced mood disorder: Secondary | ICD-10-CM

## 2014-04-19 DIAGNOSIS — F319 Bipolar disorder, unspecified: Secondary | ICD-10-CM | POA: Diagnosis present

## 2014-04-19 MED ORDER — ELVITEG-COBIC-EMTRICIT-TENOFAF 150-150-200-10 MG PO TABS
1.0000 | ORAL_TABLET | Freq: Every day | ORAL | Status: DC
  Filled 2014-04-19: qty 1

## 2014-04-19 MED ORDER — TRAZODONE HCL 50 MG PO TABS
50.0000 mg | ORAL_TABLET | Freq: Every evening | ORAL | Status: DC | PRN
  Administered 2014-04-19 – 2014-04-20 (×2): 50 mg via ORAL
  Administered 2014-04-21: 100 mg via ORAL
  Administered 2014-04-22 – 2014-04-25 (×4): 50 mg via ORAL
  Filled 2014-04-19 (×4): qty 1
  Filled 2014-04-19: qty 28
  Filled 2014-04-19: qty 1

## 2014-04-19 MED ORDER — CHLORDIAZEPOXIDE HCL 25 MG PO CAPS
25.0000 mg | ORAL_CAPSULE | Freq: Every day | ORAL | Status: AC
  Administered 2014-04-23: 25 mg via ORAL
  Filled 2014-04-19: qty 1

## 2014-04-19 MED ORDER — ALUM & MAG HYDROXIDE-SIMETH 200-200-20 MG/5ML PO SUSP
30.0000 mL | ORAL | Status: DC | PRN
  Administered 2014-04-24: 30 mL via ORAL
  Filled 2014-04-19: qty 30

## 2014-04-19 MED ORDER — HYDROXYZINE HCL 25 MG PO TABS
25.0000 mg | ORAL_TABLET | Freq: Four times a day (QID) | ORAL | Status: AC | PRN
  Administered 2014-04-19 – 2014-04-21 (×3): 25 mg via ORAL
  Filled 2014-04-19 (×3): qty 1

## 2014-04-19 MED ORDER — ONDANSETRON 4 MG PO TBDP: 4.0000 mg | ORAL_TABLET | Freq: Four times a day (QID) | ORAL | Status: AC | PRN

## 2014-04-19 MED ORDER — THIAMINE HCL 100 MG/ML IJ SOLN: 100.0000 mg | Freq: Once | INTRAMUSCULAR | Status: DC

## 2014-04-19 MED ORDER — ALBUTEROL SULFATE HFA 108 (90 BASE) MCG/ACT IN AERS
2.0000 | INHALATION_SPRAY | RESPIRATORY_TRACT | Status: DC | PRN
  Administered 2014-04-19 – 2014-04-26 (×6): 2 via RESPIRATORY_TRACT
  Filled 2014-04-19 (×2): qty 6.7

## 2014-04-19 MED ORDER — LOPERAMIDE HCL 2 MG PO CAPS: 2.0000 mg | ORAL_CAPSULE | ORAL | Status: AC | PRN

## 2014-04-19 MED ORDER — DARUNAVIR ETHANOLATE 800 MG PO TABS
800.0000 mg | ORAL_TABLET | Freq: Every day | ORAL | Status: DC
  Administered 2014-04-20 – 2014-04-26 (×7): 800 mg via ORAL
  Filled 2014-04-19: qty 1
  Filled 2014-04-19: qty 14
  Filled 2014-04-19 (×7): qty 1

## 2014-04-19 MED ORDER — MAGNESIUM HYDROXIDE 400 MG/5ML PO SUSP: 30.0000 mL | Freq: Every day | ORAL | Status: DC | PRN

## 2014-04-19 MED ORDER — CHLORDIAZEPOXIDE HCL 25 MG PO CAPS
25.0000 mg | ORAL_CAPSULE | ORAL | Status: AC
  Administered 2014-04-21 – 2014-04-22 (×2): 25 mg via ORAL
  Filled 2014-04-19 (×2): qty 1

## 2014-04-19 MED ORDER — ADULT MULTIVITAMIN W/MINERALS CH
1.0000 | ORAL_TABLET | Freq: Every day | ORAL | Status: DC
Start: 2014-04-19 — End: 2014-04-27
  Administered 2014-04-19 – 2014-04-26 (×8): 1 via ORAL
  Filled 2014-04-19 (×11): qty 1

## 2014-04-19 MED ORDER — CHLORDIAZEPOXIDE HCL 25 MG PO CAPS
25.0000 mg | ORAL_CAPSULE | Freq: Four times a day (QID) | ORAL | Status: AC
  Administered 2014-04-19 – 2014-04-20 (×6): 25 mg via ORAL
  Filled 2014-04-19 (×6): qty 1

## 2014-04-19 MED ORDER — CHLORDIAZEPOXIDE HCL 25 MG PO CAPS
25.0000 mg | ORAL_CAPSULE | Freq: Four times a day (QID) | ORAL | Status: AC | PRN
  Administered 2014-04-20 – 2014-04-21 (×2): 25 mg via ORAL
  Filled 2014-04-19 (×2): qty 1

## 2014-04-19 MED ORDER — VITAMIN B-1 100 MG PO TABS
100.0000 mg | ORAL_TABLET | Freq: Every day | ORAL | Status: DC
  Administered 2014-04-20 – 2014-04-26 (×7): 100 mg via ORAL
  Filled 2014-04-19 (×10): qty 1

## 2014-04-19 MED ORDER — CHLORDIAZEPOXIDE HCL 25 MG PO CAPS
25.0000 mg | ORAL_CAPSULE | Freq: Three times a day (TID) | ORAL | Status: AC
  Administered 2014-04-20 – 2014-04-21 (×3): 25 mg via ORAL
  Filled 2014-04-19 (×3): qty 1

## 2014-04-19 MED ORDER — DIVALPROEX SODIUM ER 500 MG PO TB24
500.0000 mg | ORAL_TABLET | Freq: Every day | ORAL | Status: DC
  Administered 2014-04-19: 500 mg via ORAL
  Filled 2014-04-19 (×3): qty 1

## 2014-04-19 MED ORDER — QUETIAPINE FUMARATE 100 MG PO TABS
100.0000 mg | ORAL_TABLET | Freq: Every day | ORAL | Status: DC
  Administered 2014-04-19: 100 mg via ORAL
  Filled 2014-04-19 (×3): qty 1

## 2014-04-19 MED ORDER — ACETAMINOPHEN 325 MG PO TABS
650.0000 mg | ORAL_TABLET | Freq: Four times a day (QID) | ORAL | Status: DC | PRN
  Administered 2014-04-19 – 2014-04-22 (×3): 650 mg via ORAL
  Filled 2014-04-19 (×3): qty 2
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
## 2014-04-19 NOTE — Progress Notes (Signed)
D: Patient denies HI and visual hallucinations; patient reports sleep is poor; reports appetite is poor; reports energy level is low ; reports ability to concentrate is poor; rates depression as 9/10; rates hopelessness 9/10; rates anxiety as 8/10; patient reports thoughts of SI and hearing voices telling him to harm himself but patient contracts for safety; patient reported the voices were increasing  A: Monitored q 15 minutes; patient encouraged to attend groups; patient educated about medications; patient given medications per physician orders; patient encouraged to express feelings and/or concerns  R: Patient is very tremulous sweating and fatigued; patient is cooperative but very sad and flat and blunted; patient's interaction with staff and peers is appropriate; patient was able to set goal to talk with staff 1:1 when having feelings of SI; patient is taking medications as prescribed and tolerating medication
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
## 2014-04-19 NOTE — Progress Notes (Addendum)
    John [name] for Infectious Disease    Date of Admission:  04/18/2014      ID: John [name] is a 44 y.o. male with HIV disease, well controlled on stribild-DRV in Dec 2015, who was admitted on 04/18/14 for Suicide Ideation. He reports that he has not taken his meds lately, although he was seen by Dr. Tommy Medal on 1/27 reportedly taking meds at that time Active Problems:   Bipolar 1 disorder   Medications:  . chlordiazePOXIDE  25 mg Oral QID   Followed by  . [START ON 04/20/2014] chlordiazePOXIDE  25 mg Oral TID   Followed by  . [START ON 04/21/2014] chlordiazePOXIDE  25 mg Oral BH-qamhs   Followed by  . [START ON 04/23/2014] chlordiazePOXIDE  25 mg Oral Daily  . [START ON 04/20/2014] darunavir  800 mg Oral Q breakfast  . divalproex  500 mg Oral QHS  . [START ON 04/20/2014] elvitegravir-cobicistat-emtricitabine-tenofovir  1 tablet Oral Q breakfast  . multivitamin with minerals  1 tablet Oral Daily  . QUEtiapine  100 mg Oral QHS  . thiamine  100 mg Intramuscular Once  . [START ON 04/20/2014] thiamine  100 mg Oral Daily    Objective: Vital signs in last 24 hours: Temp:  [98 F (36.7 C)-99.8 F (37.7 C)] 98.6 F (37 C) (02/05 0025) Pulse Rate:  [66-108] 66 (02/05 1159) Resp:  [16-18] 16 (02/05 1159) BP: (112-140)/(57-104) 114/70 mmHg (02/05 1159) SpO2:  [98 %-100 %] 100 % (02/05 0025) Weight:  [184 lb (83.462 kg)] 184 lb (83.462 kg) (02/05 0025)  No exam  Lab Results  Recent Labs  04/18/14 1911  WBC 6.6  HGB 13.8  HCT 41.0  NA 139  K 3.6  CL 105  CO2 22  BUN 10  CREATININE 1.02   Liver Panel  Recent Labs  04/18/14 1911  PROT 7.7  ALBUMIN 4.2  AST 37  ALT 32  ALKPHOS 105  BILITOT 1.0    Assessment/Plan: Will start him back on an equivalent regimen of genvoya, take 1 tablet daily plus darunavir 800mg  daily while he is hospitalized. He can resume back on his home regimen once he is released. We will make sure to have him be seen in the ID clinic in 1-2 wks. Will  check hiv viral load with genotype today  Baxter Flattery [facility] Infectious Diseases Cell: [phone] Pager: [phone]  04/19/2014, 4:18 PM

## 2014-04-19 NOTE — BHH Group Notes (Signed)
[facility] LCSW Aftercare Discharge Planning Group Note   04/19/2014 1:12 PM    Participation Quality:  Appropraite  Mood/Affect:  Appropriate  Depression Rating:  10  Anxiety Rating:  10  Thoughts of Suicide:  Yes  Will you contract for safety?  Yes  Current AVH:  Yes, patient is reporting A/H  Plan for Discharge/Comments:  Patient attended discharge planning group and actively participated in group.  Patient advised of being homeless and interested in residential treatment. Suicide prevention education reviewed and SPE document provided.   Transportation Means: Patient uses public transportation.   Supports:  Patient has a limited support system.   Captain Blucher, Eulas Post

## 2014-04-19 NOTE — Progress Notes (Signed)
D: Pt passive SI-contracts for safety, +ve AVH . Pt is pleasant and cooperative.  A: Pt was offered support and encouragement. Pt was given scheduled medications. Pt was encourage to attend groups. Q 15 minute checks were done for safety.   R:Pt attends groups and interacts well with peers and staff. Pt is taking medication. Pt has no complaints at this time .Pt receptive to treatment and safety maintained on unit.
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
## 2014-04-19 NOTE — BHH Group Notes (Signed)
Adult Psychoeducational Group Note  Date:  04/19/2014 Time:  8:59 PM  Group Topic/Focus:  Wrap-Up Group:   The focus of this group is to help patients review their daily goal of treatment and discuss progress on daily workbooks.  Participation Level:  Minimal  Participation Quality:  Drowsy  Affect:  Anxious  Cognitive:  Appropriate  Insight: Limited  Engagement in Group:  Lacking  Modes of Intervention:  Discussion  Additional Comments:  [name] said his day was terrible.  He said he was hearing voices and his depression won't stop.  He also expressed the only thing that has helped him deal with the depression and voices is medication.   [name] A 04/19/2014, 8:59 PM

## 2014-04-19 NOTE — Progress Notes (Signed)
Pt is voluntary. Pt is here due to having SI thoughts with a plan to walk into traffic or jump off a bridge. Pt is currently SI/AVH and denies HI. Pt states the voices are telling him to hurt himself but he knows he does not need to do it and contracts for safety. Pt is depressed at a 9, rates anxiety at a 9, and helplessness/hopelessness at an 8. Pt states he is just tired of not taking medications, drinking at least 6 40's a day, being depressed, and hearing voices. Pt also stated he does a dime bag of weed daily and is homeless. Pt is pleasant and cooperative.

## 2014-04-19 NOTE — Tx Team (Signed)
Initial Interdisciplinary Treatment Plan   PATIENT STRESSORS: Health problems Substance abuse   PATIENT STRENGTHS: Ability for insight Supportive family/friends   PROBLEM LIST: Problem List/Patient Goals Date to be addressed Date deferred Reason deferred Estimated date of resolution  "Need medication management" 04/19/14     "Want positive coping skills" 04/19/14     Substance Abuse 04/19/14     SI thoughts 04/19/14     Auditory Hallucinations 04/19/14                              DISCHARGE CRITERIA:  Improved stabilization in mood, thinking, and/or behavior Withdrawal symptoms are absent or subacute and managed without 24-hour nursing intervention  PRELIMINARY DISCHARGE PLAN: Attend aftercare/continuing care group Placement in alternative living arrangements  PATIENT/FAMIILY INVOLVEMENT: This treatment plan has been presented to and reviewed with the patient, John [name], and/or family member.  The patient and family have been given the opportunity to ask questions and make suggestions.  Beverly Sessions Violon 04/19/2014, 1:02 AM

## 2014-04-19 NOTE — BHH Group Notes (Signed)
[place] LCSW Group Therapy  Feelings Around Relapse  04/19/2014 3:21 PM  Type of Therapy:  Group Therapy  Participation Level:  Did Not Attend - patient in bed reported not feeling well.  Concha Pyo 04/19/2014, 3:21 PM
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
## 2014-04-20 ENCOUNTER — Encounter (HOSPITAL_COMMUNITY): Payer: Self-pay | Admitting: Registered Nurse

## 2014-04-20 DIAGNOSIS — F1910 Other psychoactive substance abuse, uncomplicated: Secondary | ICD-10-CM

## 2014-04-20 DIAGNOSIS — R443 Hallucinations, unspecified: Secondary | ICD-10-CM

## 2014-04-20 DIAGNOSIS — G4700 Insomnia, unspecified: Secondary | ICD-10-CM

## 2014-04-20 DIAGNOSIS — F329 Major depressive disorder, single episode, unspecified: Secondary | ICD-10-CM

## 2014-04-20 DIAGNOSIS — R45851 Suicidal ideations: Secondary | ICD-10-CM

## 2014-04-20 MED ORDER — OLANZAPINE 10 MG PO TBDP
10.0000 mg | ORAL_TABLET | Freq: Every day | ORAL | Status: DC
  Filled 2014-04-20: qty 1

## 2014-04-20 MED ORDER — LORAZEPAM 1 MG PO TABS
ORAL_TABLET | ORAL | Status: AC
  Administered 2014-04-20: 1 mg via ORAL
  Filled 2014-04-20: qty 1

## 2014-04-20 MED ORDER — DIVALPROEX SODIUM 500 MG PO DR TAB
500.0000 mg | DELAYED_RELEASE_TABLET | Freq: Two times a day (BID) | ORAL | Status: DC
  Administered 2014-04-20 – 2014-04-22 (×4): 500 mg via ORAL
  Filled 2014-04-20 (×8): qty 1

## 2014-04-20 MED ORDER — OLANZAPINE 10 MG PO TBDP
ORAL_TABLET | ORAL | Status: AC
  Administered 2014-04-20: 10 mg via ORAL
  Filled 2014-04-20: qty 1

## 2014-04-20 MED ORDER — LORAZEPAM 1 MG PO TABS
1.0000 mg | ORAL_TABLET | ORAL | Status: AC
  Administered 2014-04-20: 1 mg via ORAL

## 2014-04-20 MED ORDER — OLANZAPINE 10 MG PO TBDP
10.0000 mg | ORAL_TABLET | ORAL | Status: AC
  Administered 2014-04-20: 10 mg via ORAL
  Filled 2014-04-20: qty 1

## 2014-04-20 MED ORDER — BENZTROPINE MESYLATE 0.5 MG PO TABS
0.5000 mg | ORAL_TABLET | Freq: Two times a day (BID) | ORAL | Status: DC
  Administered 2014-04-20 – 2014-04-23 (×7): 0.5 mg via ORAL
  Filled 2014-04-20 (×11): qty 1

## 2014-04-20 MED ORDER — GABAPENTIN 100 MG PO CAPS
200.0000 mg | ORAL_CAPSULE | Freq: Three times a day (TID) | ORAL | Status: DC
  Administered 2014-04-20 – 2014-04-26 (×18): 200 mg via ORAL
  Filled 2014-04-20 (×13): qty 2
  Filled 2014-04-20: qty 84
  Filled 2014-04-20 (×2): qty 2
  Filled 2014-04-20: qty 84
  Filled 2014-04-20 (×6): qty 2
  Filled 2014-04-20: qty 84

## 2014-04-20 MED ORDER — LORAZEPAM 2 MG/ML IJ SOLN: 1.0000 mg | INTRAMUSCULAR | Status: AC

## 2014-04-20 MED ORDER — ELVITEG-COBIC-EMTRICIT-TENOFDF 150-150-200-300 MG PO TABS
1.0000 | ORAL_TABLET | Freq: Every day | ORAL | Status: DC
  Administered 2014-04-20 – 2014-04-26 (×7): 1 via ORAL
  Filled 2014-04-20 (×7): qty 1
  Filled 2014-04-20: qty 14
  Filled 2014-04-20: qty 1

## 2014-04-20 MED ORDER — QUETIAPINE FUMARATE ER 300 MG PO TB24
300.0000 mg | ORAL_TABLET | Freq: Every day | ORAL | Status: DC
  Administered 2014-04-20: 300 mg via ORAL
  Filled 2014-04-20 (×4): qty 1

## 2014-04-20 NOTE — BHH Group Notes (Signed)
[facility] Group Notes:  (Nursing/MHT/Case Management/Adjunct)  Date:  04/20/2014  Time:  12:39 PM  Type of Therapy:  Psychoeducational Skills  Participation Level:  Active  Participation Quality:  Appropriate  Affect:  Appropriate  Cognitive:  Appropriate  Insight:  Appropriate  Engagement in Group:  Engaged  Modes of Intervention:  Discussion  Summary of Progress/Problems: Pt did attend self inventory group, pt reported that he was positive SI/HI, and AH/VH noted. Pt did contract for safety. Pt rated his depression as a 9, and his helplessness/hopelessness as a 8.     Pt reported concerns about his medication not working, [employer] NP made aware.  John [name] 04/20/2014, 12:39 PM

## 2014-04-20 NOTE — Plan of Care (Signed)
Problem: Alteration in mood & ability to function due to Goal: LTG-Pt reports reduction in suicidal thoughts (Patient reports reduction in suicidal thoughts and is able to verbalize a safety plan for whenever patient is feeling suicidal)  Outcome: Not Progressing Pt continues to endorse SI at this time Goal: STG-Patient will comply with prescribed medication regimen (Patient will comply with prescribed medication regimen)  Outcome: Progressing Pt taking medications as prescribed at this time

## 2014-04-20 NOTE — Progress Notes (Signed)
Did not attend group 
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
## 2014-04-20 NOTE — [facility] Group Notes (Signed)
[place] Group Notes:  (Clinical Social Work)  04/20/2014     1:30-1:45PM  Summary of Progress/Problems:   The main focus of today's process group was to learn how to use a decisional balance exercise to move forward in the Stages of Change.  Motivational Interviewing was utilized to help patients explore in depth the perceived benefits and costs of a self-sabotaging behavior, as well as the  benefits and costs of replacing that with a healthy coping mechanism.   The patient expressed that he is in the hospital for Bipolar symptoms, depression, and drinking.  He would like his drinking to cease entirely, and stated he is highly motivated but not completely (8 out of 10) to stop drinking.  He is completely motivated (10 out of 10) to do "whatever it takes" to gain control over his depression.  Type of Therapy:  Group Therapy - Process   Participation Level:  Active  Participation Quality:  Attentive  Affect:  Blunted and Depressed  Cognitive:  Appropriate  Insight:  Developing/Improving  Engagement in Therapy:  Developing/Improving  Modes of Intervention:  Education, Motivational Interviewing  Selmer Dominion, LCSW 04/20/2014, 3:15 PM

## 2014-04-20 NOTE — Progress Notes (Signed)
Writer entered patients room and observed him lying in bed asleep, he was easily aroused when his name was called. He appeared sad and tearful when Probation officer talked with him. He reports that he feels a little better after receiving meds earlier. Patient reports passive si and contracts for safety, auditory and visual hallucinations reported also. Patient request his hs medications as soon as possible to help him to rest. Support and encouragement given, safety maintained on unit with 15 min checks.

## 2014-04-20 NOTE — Progress Notes (Addendum)
Patient ID: John [name], male   DOB: [birth year], 44 y.o.   MRN: [id number]   D: Pt has been very flat and depressed on the unit today, pt has also been very irritable and agitated. Pt reported that he was hearing voices and that seeing things. Pt also reported that he was positive SI/HI, and that he just wanted to stay in his room because  He is having thoughts of snapping peoples neck. This Probation officer spoke to patient and asked if he could contract for safety pt reported that he could. Pt reported that his medications were not working, [employer] NP made aware new orders noted. Pt reported that his depression was a 9, and that his hopelessness was a 8.  A: 15 min checks continued for patient safety. R: Pt safety maintained.

## 2014-04-21 DIAGNOSIS — F419 Anxiety disorder, unspecified: Secondary | ICD-10-CM

## 2014-04-21 LAB — HIV-1 RNA ULTRAQUANT REFLEX TO GENTYP+
HIV 1 RNA Quant: <20 copies/mL (ref <20)
HIV-1 RNA Quant, Log: <1.3 {Log} (ref <1.30)

## 2014-04-21 LAB — VALPROIC ACID LEVEL: Valproic Acid Lvl: 34.1 ug/mL — ABNORMAL LOW (ref 50.0–100.0)

## 2014-04-21 MED ORDER — QUETIAPINE FUMARATE ER 400 MG PO TB24
400.0000 mg | ORAL_TABLET | Freq: Every day | ORAL | Status: DC
  Administered 2014-04-21: 300 mg via ORAL
  Filled 2014-04-21 (×3): qty 1

## 2014-04-21 MED ORDER — QUETIAPINE FUMARATE ER 50 MG PO TB24
ORAL_TABLET | ORAL | Status: AC
  Filled 2014-04-21: qty 2

## 2014-04-21 NOTE — [facility] Group Notes (Signed)
[place] [facility] Group Notes:  (Nursing/MHT/Case Management/Adjunct)  Date:  04/21/2014  Time:  4:29 PM  Type of Therapy:  Psychoeducational Skills  Participation Level:  Active  Participation Quality:  Appropriate  Affect:  Appropriate  Cognitive:  Appropriate  Insight:  Appropriate  Engagement in Group:  Engaged  Modes of Intervention:  Discussion  Summary of Progress/Problems: Pt did attend self inventory group.     John [name] Shanta 04/21/2014, 4:29 PM
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
## 2014-04-21 NOTE — Progress Notes (Signed)
Writer spoke with patient 1:1 and he reports that he is feeling very anxious and the voices are getting louder. Patient received medications to help with his anxiety. He had blood drawn earlier this evening and refused the draw at first reporting that he doesn't like needles and he feels that he is being stuck too often. Patient was encouraged to have blood drawn and explained the reason for it. Patient informed of scheduled medications and requested them at 2100. Support and encouragement given, safety maintained on unit with 15 min checks.

## 2014-04-21 NOTE — Progress Notes (Signed)
Adult Psychoeducational Group Note  Date:  04/21/2014 Time:  3:26 PM  Group Topic/Focus:  Wrap-Up Group:   The focus of this group is to help patients review their daily goal of treatment and discuss progress on daily workbooks.  Participation Level:  Did Not Attend  Participation Quality:  Drowsy  Affect:  Not Congruent  Cognitive:  na  Insight: None  Engagement in Group:  NA  Modes of Intervention:  NA  Additional Comments:  Pt decided not to come to wrap up group... Pt stayed in bed   Yeraldy Spike R 04/21/2014, 3:26 PM

## 2014-04-21 NOTE — Progress Notes (Signed)
Patient ID: John [name], male   DOB: 06/16/1970, 44 y.o.   MRN: [id number]   D: Pt has been very flat and depressed on the unit today, pt has also been very irritable and agitated. Pt reported that he was hearing voices and that seeing things. Pt also reported that he was positive SI/HI, and that he just wanted to stay in his room because He is having thoughts of snapping peoples neck. This Probation officer spoke to patient and asked if he could contract for safety pt reported that he could. Pt reported that his medications were not working, [employer] NP made aware new orders noted. Pt reported that his depression was a 10, and that his hopelessness was a 8. A: 15 min checks continued for patient safety. R: Pt safety maintained.

## 2014-04-21 NOTE — [facility] Group Notes (Signed)
[facility] Group Notes:  (Clinical Social Work)  04/21/2014  11:15AM-12:05PM  Summary of Progress/Problems:   The main focus of today's process group was to   1)  discuss the importance of adding supports  2)  define health supports versus unhealthy supports  3)  identify the patient's current unhealthy supports and plan how to handle them  4)  Identify the patient's current healthy supports and plan what to add.  An emphasis was placed on using counselor, doctor, therapy groups, 12-step groups, and problem-specific support groups to expand supports.    The patient expressed full comprehension of the concepts presented, and agreed that there is a need to add more supports.  The patient stated his healthy support is his aunt, who is always there for him to encourage, pick up, remind about medications, and keep him from being around the wrong type of people.  His peers are unhealthy for him.  He would like to get in touch with a brother who is in prison.  Type of Therapy:  Process Group with Motivational Interviewing  Participation Level:  Active  Participation Quality:  Attentive and Sharing  Affect:  Blunted and Depressed  Cognitive:  Alert and Appropriate  Insight:  Engaged  Engagement in Therapy:  Engaged  Modes of Intervention:   Education, Support and Processing, Activity  [employer], LCSW 04/21/2014, 12:15pm

## 2014-04-22 DIAGNOSIS — F1020 Alcohol dependence, uncomplicated: Secondary | ICD-10-CM

## 2014-04-22 DIAGNOSIS — F1024 Alcohol dependence with alcohol-induced mood disorder: Secondary | ICD-10-CM | POA: Insufficient documentation

## 2014-04-22 DIAGNOSIS — F1420 Cocaine dependence, uncomplicated: Secondary | ICD-10-CM

## 2014-04-22 MED ORDER — DIVALPROEX SODIUM ER 500 MG PO TB24
500.0000 mg | ORAL_TABLET | Freq: Every day | ORAL | Status: DC
  Administered 2014-04-23 – 2014-04-26 (×4): 500 mg via ORAL
  Filled 2014-04-22: qty 14
  Filled 2014-04-22 (×5): qty 1

## 2014-04-22 MED ORDER — QUETIAPINE FUMARATE 50 MG PO TABS
50.0000 mg | ORAL_TABLET | Freq: Every day | ORAL | Status: DC
  Administered 2014-04-23 – 2014-04-26 (×4): 50 mg via ORAL
  Filled 2014-04-22 (×3): qty 1
  Filled 2014-04-22: qty 14
  Filled 2014-04-22 (×2): qty 1

## 2014-04-22 MED ORDER — IBUPROFEN 400 MG PO TABS
400.0000 mg | ORAL_TABLET | Freq: Four times a day (QID) | ORAL | Status: DC | PRN
  Administered 2014-04-24: 400 mg via ORAL
  Filled 2014-04-22: qty 1

## 2014-04-22 MED ORDER — QUETIAPINE FUMARATE 50 MG PO TABS
50.0000 mg | ORAL_TABLET | Freq: Two times a day (BID) | ORAL | Status: DC
  Administered 2014-04-22: 50 mg via ORAL
  Filled 2014-04-22 (×6): qty 1

## 2014-04-22 MED ORDER — DIVALPROEX SODIUM ER 500 MG PO TB24
1000.0000 mg | ORAL_TABLET | Freq: Every day | ORAL | Status: DC
  Administered 2014-04-22 – 2014-04-25 (×4): 1000 mg via ORAL
  Filled 2014-04-22 (×4): qty 2
  Filled 2014-04-22: qty 28
  Filled 2014-04-22 (×2): qty 2

## 2014-04-22 MED ORDER — DIVALPROEX SODIUM ER 500 MG PO TB24
500.0000 mg | ORAL_TABLET | Freq: Every day | ORAL | Status: DC
  Filled 2014-04-22 (×2): qty 1

## 2014-04-22 MED ORDER — QUETIAPINE FUMARATE 400 MG PO TABS
400.0000 mg | ORAL_TABLET | Freq: Every day | ORAL | Status: DC
  Administered 2014-04-22: 400 mg via ORAL
  Filled 2014-04-22 (×3): qty 1

## 2014-04-22 NOTE — Progress Notes (Signed)
[facility] 1:1 Observation Documentation  For the first (8) hours following discontinuation of 1:1 precautions, a progress note entry by nursing staff should be documented at least every 2 hours, reflecting the patient's behavior, condition, mood, and conversation.  Use the progress notes for additional entries.  Time 1:1 discontinued:  1311  Patient's Behavior:  Pt continues to sit in dayroom. Pt calm and cooperative at this time. Pt contracts for safety at this time.  Patient's Condition:  Pt safety maintained at this time  Patient's Conversation:  John [name]   Darrol Angel L 04/22/2014, 3:55 PM

## 2014-04-22 NOTE — Progress Notes (Signed)
[facility] 1:1 Observation Documentation  For the first (8) hours following discontinuation of 1:1 precautions, a progress note entry by nursing staff should be documented at least every 2 hours, reflecting the patient's behavior, condition, mood, and conversation.  Use the progress notes for additional entries.  Time 1:1 discontinued: 1311  Patient's Behavior:  Patient calm and cooperative.  Patient verbally contracts for safety  Patient's Condition:  Patient safety maintained.  Patient has no complaints  Patient's Conversation:  Logical and coherent.  Patient verbally contracts for safety.  Pricilla Riffle M 04/22/2014,

## 2014-04-22 NOTE — Progress Notes (Addendum)
Observation note: Pt placed on 1:1 observation for safety. Pt reported to writer that he was experiencing increase auditory and visual hallucinations.  Pt stated that he's hearing voices telling him to jump out the window. Pt reported that he's afraid to be alone. Pt would not contract for safety.     D: Pt report auditory and visual hallucinations. Voices telling him to kill himself and seeing pictures on the wall. Pt stated that he had difficulty sleeping last night d/t the hallucinations and chills. Pt reports passive suicidal thoughts and was able to contract for safety on approach this morning. Pt appears disheveled with an odor. Pt isolates in his room and has minimal interaction on the unit.  A: Medications administered as ordered per MD.  MD made aware of pt complaints. Pt placed on one to one observation. Pt encouraged to attend groups. 15 minute checks performed for safety.  R: Pt attention seeking and needy throughout the morning. Pt requesting more days in hospital. Pt safety maintained.

## 2014-04-22 NOTE — Progress Notes (Signed)
[facility] 1:1 Observation Documentation  For the first (8) hours following discontinuation of 1:1 precautions, a progress note entry by nursing staff should be documented at least every 2 hours, reflecting the patient's behavior, condition, mood, and conversation.  Use the progress notes for additional entries.  Time 1:1 discontinued:  1311  Patient's Behavior:  Pt calm and cooperative at this time. Pt verbally contracts for safety.   Patient's Condition:  15 minute checks performed for safety. Pt safety maintained.  Patient's Conversation:  Logical/coherent  Ksean Vale L 04/22/2014, 7:16 PM

## 2014-04-22 NOTE — [facility] Group Notes (Signed)
[facility] LCSW Aftercare Discharge Planning Group Note   04/22/2014 12:48 PM    Participation Quality:  Appropraite  Mood/Affect:  Appropriate  Depression Rating:  10  Anxiety Rating:  10  Thoughts of Suicide:  No  Will you contract for safety?   NA  Current AVH:  No  Plan for Discharge/Comments:  Patient attended discharge planning group and actively participated in group. Patient advised he is no longer interested in [facility] for residential treatment.  He advised of plans to follow up with outpatient services. Suicide prevention education reviewed and SPE document provided.   Transportation Means: Patient uses public transportation.   Supports:  Patient has a limited support system.   Corie Vavra, [facility]

## 2014-04-22 NOTE — Progress Notes (Signed)
[facility] 1:1 Observation Documentation  For the first (8) hours following discontinuation of 1:1 precautions, a progress note entry by nursing staff should be documented at least every 2 hours, reflecting the patient's behavior, condition, mood, and conversation.  Use the progress notes for additional entries.  Time 1:1 discontinued:  1311  Patient's Behavior:  Pt sitting in dayroom watching t.v. Pt verbally contracts for safety.   Patient's Condition:  Pt safety maintained. 15 minute checks performed for safety.  Patient's Conversation:  Logical/coherent  Susannah Carbin L 04/22/2014, 7:17 PM

## 2014-04-22 NOTE — Progress Notes (Signed)
D:Patient in the dayroom sitting quietly on approach.  Patient states he continues to be depressed and hear voices.  Patient states he is passive SI but verbally contracts for safety.  Patient denies HI but states he is hearing auditory hallucinations telling him to harm himself.   A: Staff to monitor Q 15 mins for safety.  Encouragement and support offered.  Scheduled medications administered per orders.  Trazodone administered prn for insomnia R: Patient remains safe on the unit.  Patient attended group tonight.  Patient visible on the unit.  Patient taking administered medications
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
## 2014-04-22 NOTE — Progress Notes (Signed)
Pt resting in bed with eyes closed.  No distress observed.  Respirations even/unlabored.  Safety maintained with q15 minute checks.

## 2014-04-22 NOTE — Progress Notes (Signed)
[facility] 1:1 Observation Documentation  For the first (8) hours following discontinuation of 1:1 precautions, a progress note entry by nursing staff should be documented at least every 2 hours, reflecting the patient's behavior, condition, mood, and conversation.  Use the progress notes for additional entries.  Time 1:1 discontinued:  1311  Patient's Behavior:  Pt calm and cooperative at this time. Pt reports decrease hallucinations and is now able to contract for safety. Pt denies having any suicidal thoughts at this time.   Patient's Condition:  Pt safety maintained. 15 minute checks performed for safety.   Patient's Conversation:  Logical/coherent   Satonya Lux L 04/22/2014, 1:17 PM

## 2014-04-22 NOTE — [facility] Group Notes (Signed)
[facility] Group [facility]          Overcoming Obstacles       1:15 -2:30        04/22/2014   2:47 PM    Type of Therapy:  Group Therapy  Participation Level: Minimal  Participation Quality:  Appropriate  Affect:  Appropriate  Cognitive:  Appropriate  Insight: Developing/Improving  Engagement in Therapy: Developing/Imprvoing  Modes of Intervention:  Discussion Exploration  Education Rapport BuildingProblem-Solving Support  Summary of Progress/Problems:  The main focus of today's group was overcoming obstacles. Patient did not engage in discussion.   Concha Pyo 04/22/2014   2:47 PM

## 2014-04-23 LAB — LIPID PANEL
Cholesterol: 158 mg/dL (ref 0–200)
HDL: 53 mg/dL (ref >39)
LDL Cholesterol: 64 mg/dL (ref 0–99)
Total CHOL/HDL Ratio: 3 RATIO
Triglycerides: 206 mg/dL — ABNORMAL HIGH (ref <150)
VLDL: 41 mg/dL — ABNORMAL HIGH (ref 0–40)

## 2014-04-23 LAB — HEMOGLOBIN A1C
HEMOGLOBIN A1C: 5.6 % (ref 4.8–5.6)
Mean Plasma Glucose: 114 mg/dL

## 2014-04-23 MED ORDER — NICOTINE 21 MG/24HR TD PT24
21.0000 mg | MEDICATED_PATCH | Freq: Every day | TRANSDERMAL | Status: DC
  Administered 2014-04-23 – 2014-04-26 (×3): 21 mg via TRANSDERMAL
  Filled 2014-04-23 (×5): qty 1

## 2014-04-23 MED ORDER — QUETIAPINE FUMARATE 200 MG PO TABS
200.0000 mg | ORAL_TABLET | Freq: Every day | ORAL | Status: DC
  Administered 2014-04-23: 200 mg via ORAL
  Filled 2014-04-23 (×4): qty 1

## 2014-04-23 NOTE — Progress Notes (Signed)
[facility] 0915  The focus of this group is to educate the patient on the purpose and policies of crisis stabilization and provide a format to answer questions about their admission.  The group details unit policies and expectations of patients while admitted.

## 2014-04-23 NOTE — Plan of Care (Signed)
Problem: Alteration in mood & ability to function due to Goal: STG-Patient will attend groups Outcome: Progressing Patient has been attending some groups with positive reinforcement. Patient is still not attending certain groups.
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
## 2014-04-23 NOTE — [facility] Group Notes (Signed)
[facility] LCSW Group Therapy      Feelings About Diagnosis 1:15 - 2:30 PM         04/23/2014 3:20 PM    Type of Therapy:  Group Therapy  Participation Level:  Active  Participation Quality:  Appropriate  Affect:  Appropriate  Cognitive: Appropriate  Insight:  Developing/Improving   Engagement in Therapy:  Developing/Improving   Modes of Intervention:  Discussion, Education, Exploration, Problem-Solving, Rapport Building, Support  Summary of Progress/Problems:  Patient actively participated in group. Patient discussed past and present diagnosis and the effects it has had on  life.  Patient talked about family and society being judgmental and the stigma associated with having a mental health diagnosis. He shared his diagnosis causes him to feel sad and angry.  He shared if he is not on medications, he will act out in anger.    Concha Pyo 04/23/2014  3:20 PM

## 2014-04-23 NOTE — Progress Notes (Signed)
Pt attended spiritual care group on grief and loss facilitated by counseling intern Martinique Austin and  chaplain Jerene Pitch. Group opened with brief discussion and psycho-social ed around grief and loss in relationships and in relation to self - identifying life patterns, circumstances, changes that cause losses. Established group norm of speaking from own life experience. Group goal of establishing open and affirming space for members to share loss and experience with grief, normalize grief experience and provide psycho social education and grief support.  Group drew on narrative and Alderian therapeutic modalities.    John [name] was present throughout group.  Spoke with group about [place] / voices.  Described hearing voices, feelings of anger / rage,  feeling as though he does not have "control" or he "blacks out."  Stated when he hears these voices things can "set him off" and he can act out in anger / harm others.   Described often feeling as though others are talking about him when they are not.  When he feels this fear he can "lash out" before he catches himself.    Described frustration around "not being able to find a medication that will take the voices away."  Concerned about discharging though he does not feel well.    Through latter half of group, John [name] was seated with arms folded and breathing heavily.    John [name], John [name]

## 2014-04-23 NOTE — Progress Notes (Signed)
Patient ID: John [name], male   DOB: 01-24-1971, 44 y.o.   MRN: [id number]   D: Pt informed the writer that he's "ready to go". Stated, "his aunt just got her check and she always give him a little bit of money". Stated he may tell her to keep it or give it to her son. Stated, "when she gives it to me she knows I'm going to come back staggling", referring to staggering". Writer attempted to encourage pt to continue with treatment after discharge.   A:  Support and encouragement was offered. 15 min checks continued for safety.  R: Pt remains safe.

## 2014-04-23 NOTE — Progress Notes (Signed)
Patient ID: John [name], male   DOB: 08-16-1970, 44 y.o.   MRN: [id number]  DAR: Pt. Denies HI to Probation officer. Patient reports +A/V hallucinations and passive SI (no plan). Patient reports he can contract for safety. Patient does not report any pain or discomfort at this time. Support and encouragement provided to the patient. Scheduled medications administered to patient per physician's orders. Patient is irritable when request was denied to have a sandwich an hour before lunch. Patient said to Probation officer, "can get my medicines but can't get a sandwich ain't that a bitch." Patient went to lunch and when he came back he inquired about clothes he left sitting in the hallway in a bag. Patient became aggravated when writer was unable to find said clothes that patient just brought to writer's attention. Patient wanted to see "somebody" about it. Patient walked away and came back and stated, "I found them they were in my room." Patient is seen in the milieu at times for snacks and some groups. Patient is seen interacting with peers. Q15 minute checks are maintained for safety.

## 2014-04-23 NOTE — Tx Team (Signed)
Interdisciplinary Treatment Plan Update   Date Reviewed:  04/23/2014  Time Reviewed:  8:37 AM  Progress in Treatment:   Attending groups: Yes Participating in groups: Yes Taking medication as prescribed: Yes  Tolerating medication: Yes Family/Significant other contact made:  Yes, collateral contact with aunt. John [name] understands diagnosis: Yes  Discussing John [name] identified problems/goals with staff: Yes Medical problems stabilized or resolved: Yes Denies suicidal/homicidal ideation: No, John [name] endorses SI but contracts for safety. John [name] has not harmed self or others: Yes  For review of initial/current John [name] goals, please see plan of care.  Estimated Length of Stay:  2-3 days  Reasons for Continued Hospitalization:  Anxiety Depression Medication stabilization Suicidal ideation  New Problems/Goals identified:    Discharge Plan or Barriers:   Home with outpatient follow up to be determined  Additional Comments:     John [name] and CSW reviewed John [name]'s identified goals and treatment plan.  John [name] verbalized understanding and agreed to treatment plan.   Attendees:  John [name]:  04/23/2014 8:37 AM   Signature:  Gabriel Earing, MD 04/23/2014 8:37 AM  Signature: Carlton Adam, MD 04/23/2014 8:37 AM  Signature:  Marshall Cork  RN 04/23/2014 8:37 AM  Signature:  Cleopatra Cedar, RN 04/23/2014 8:37 AM  Signature:   04/23/2014 8:37 AM  Signature:  Joette Catching, LCSW 04/23/2014 8:37 AM  Signature:  Erasmo Downer Drinkard, LCSW-A 04/23/2014 8:37 AM  Signature:  Lucinda Dell, Care Coordinator [facility] 04/23/2014 8:37 AM  Signature:   04/23/2014 8:37 AM  Signature:  04/23/2014  8:37 AM  Signature:   Lars Pinks, RN [facility] 04/23/2014  8:37 AM  Signature:  04/23/2014  8:37 AM    Scribe for Treatment Team:   Joette Catching,  04/23/2014 8:37 AM

## 2014-04-24 MED ORDER — QUETIAPINE FUMARATE 300 MG PO TABS
300.0000 mg | ORAL_TABLET | Freq: Every day | ORAL | Status: DC
  Administered 2014-04-24 – 2014-04-25 (×2): 300 mg via ORAL
  Filled 2014-04-24 (×2): qty 1
  Filled 2014-04-24: qty 14
  Filled 2014-04-24 (×2): qty 1

## 2014-04-24 NOTE — Plan of Care (Signed)
Problem: Alteration in mood & ability to function due to Goal: STG-Patient will comply with prescribed medication regimen (Patient will comply with prescribed medication regimen)  Outcome: Progressing Pt has taken medications by appropriate schedule throughout the day
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
## 2014-04-24 NOTE — Progress Notes (Signed)
Patient ID: John [name], male   DOB: 1970/05/05, 44 y.o.   MRN: [id number]   D: Pt informed the writer that he wants be discharged on Friday. Stated his aunt gets of Sat morning and will pick him up from [facility].  Stated when his aunt gives him money he plans to buy a bedroom suite, and new clothes. Pt states rather than get high he's gonna buy what he needs and have his aunt put the rest into her account. Pt plans to f/u with [facility].  A:  Support and encouragement was offered. 15 min checks continued for safety.  R: Pt remains safe.

## 2014-04-24 NOTE — BHH Group Notes (Signed)
[facility] LCSW Aftercare Discharge Planning Group Note   04/24/2014 10:53 AM    Participation Quality:  Patient did not attend group.  John [name] 04/24/2014     10:53 AM

## 2014-04-24 NOTE — Progress Notes (Signed)
Patient ID: John [name], male   DOB: [birth year], 44 y.o.   MRN: [id number]   Pt currently presents with a flat affect and anxious behavior. Per self inventory, pt rates depression at a 9, hopelessness 10 and anxiety 9. Pt's daily goal is to "go home" and they intend to do so by "work hard." Pt reports good sleep, poor concentration and a poor appetite.   Pt provided with medications per providers orders. Pt's labs and vitals were monitored throughout the day. Pt supported emotionally and encouraged to express concerns and questions. Pt educated on substance abuse and medications.  Pt's safety ensured with 15 minute and environmental checks. Pt currently endorses AH which "tell me bad things" and passive SI. Pt denies VH and HI. Pt agrees to contact staff before acting on harmful thoughts.

## 2014-04-24 NOTE — BHH Group Notes (Signed)
[facility] LCSW Group Therapy  Emotional Regulation 1:15 - 2: 30 PM        04/24/2014  2:55 PM    Type of Therapy:  Group Therapy  Participation Level:  Appropriate  Participation Quality:  Appropriate  Affect:  Appropriate  Cognitive:  Attentive Appropriate  Insight:  Developing/Improving Engaged  Engagement in Therapy:  Developing/Improving Engaged  Modes of Intervention:  Discussion Exploration Problem-Solving Supportive  Summary of Progress/Problems:  Group topic was emotional regulations.  Patient participated in the discussion and was able to identify an emotion that needed to regulated.  Patient talked about having problems with anger.  He shared he chose to come into the hospital rather than taking a chance of acting out on feelings of anger.Patient was able to identify approprite coping skills.  Concha Pyo 04/24/2014 2:55 PM

## 2014-04-25 NOTE — Progress Notes (Deleted)
Patient ID: John [name], male   DOB: [birth year], 45 y.o.   MRN: [id number]

## 2014-04-25 NOTE — Progress Notes (Signed)
D:Patient in the dayroom on approach.  Patient states he had a great day.  Patient animated but states he continues to have passive SI and auditory/visual hallucinations.  Patient states he sees people walking by and hears voices telling him to kill himself.  Patient states he is not having HI.  Patient was fixated on his Seroquel tonight, patient was thinking it was decreased but Probation officer explained to him it was the same exact dose and patient was ok with it. A: Staff to monitor Q 15 mins for safety.  Encouragement and support offered.  Scheduled medications administered per orders.  Trazodone administered prn fors sleep. R: Patient remains safe on the unit.  Patient attended group tonight.  Patient visible on the unit and interacting with peers.  Patient taking administered medications.

## 2014-04-25 NOTE — Progress Notes (Signed)
D:  Patient's self inventory sheet, patient slept good last night.  Sleep medication is helpful.  Good appetite, high energy level, good concentration.  Denied depression, hopeless, anxiety.  Denied withdrawals.  SI and HI, contracts for safety.  Denied physical problems, zero pain.  Goal is to go home.  Plans to attend AA meetings.  Does have discharge plans.   A:  Medications administered per MD orders.  Emotional support and encouragement given patient. R:  SI and HI, contracts for safety.  Stated he hears voice to hurt himself, and sees people and televisions walking around him.   Patient wants his night seroquel increased, stated he did not sleep well last night.  Then on his self inventory form, stated he did sleep well and sleep medication was helpful.

## 2014-04-25 NOTE — BHH Group Notes (Signed)
Patient was invited but elected to not attend morning RN led wellness group. John [name]
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
## 2014-04-25 NOTE — BHH Group Notes (Signed)
[facility] LCSW Group Therapy  04/25/2014 2:33 PM  Type of Therapy:  Group Therapy  Participation Level:  Did Not Attend - patient in bed.  Concha Pyo 04/25/2014, 2:33 PM

## 2014-04-25 NOTE — Plan of Care (Signed)
Problem: Consults Goal: Suicide Risk Patient Education (See Patient Education module for education specifics)  Outcome: Completed/Met Date Met:  04/25/14 Nurse discussed suicidal thoughts with patient.
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
## 2014-04-26 DIAGNOSIS — F333 Major depressive disorder, recurrent, severe with psychotic symptoms: Secondary | ICD-10-CM

## 2014-04-26 MED ORDER — DIVALPROEX SODIUM ER 500 MG PO TB24: ORAL_TABLET | ORAL | Status: DC

## 2014-04-26 MED ORDER — QUETIAPINE FUMARATE 50 MG PO TABS: 50.0000 mg | ORAL_TABLET | Freq: Every day | ORAL | Status: DC

## 2014-04-26 MED ORDER — MOMETASONE FURO-FORMOTEROL FUM 100-5 MCG/ACT IN AERO: 2.0000 | INHALATION_SPRAY | Freq: Two times a day (BID) | RESPIRATORY_TRACT | Status: DC

## 2014-04-26 MED ORDER — TRAZODONE HCL 50 MG PO TABS: ORAL_TABLET | ORAL | Status: DC

## 2014-04-26 MED ORDER — QUETIAPINE FUMARATE 300 MG PO TABS: 300.0000 mg | ORAL_TABLET | Freq: Every day | ORAL | Status: DC

## 2014-04-26 MED ORDER — DARUNAVIR ETHANOLATE 800 MG PO TABS: 800.0000 mg | ORAL_TABLET | Freq: Every day | ORAL | Status: DC

## 2014-04-26 MED ORDER — GABAPENTIN 100 MG PO CAPS: 200.0000 mg | ORAL_CAPSULE | Freq: Three times a day (TID) | ORAL | Status: DC

## 2014-04-26 MED ORDER — ALBUTEROL SULFATE HFA 108 (90 BASE) MCG/ACT IN AERS: 2.0000 | INHALATION_SPRAY | RESPIRATORY_TRACT | Status: DC | PRN

## 2014-04-26 MED ORDER — ELVITEG-COBIC-EMTRICIT-TENOFDF 150-150-200-300 MG PO TABS: 1.0000 | ORAL_TABLET | Freq: Every day | ORAL | Status: DC
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
## 2014-04-26 NOTE — [facility] Suicide Risk Assessment (Signed)
[facility] INPATIENT:  Family/Significant Other Suicide Prevention Education  Suicide Prevention Education:  Education Completed; Jeanella Cara, [name], [phone]; has been identified  health crisis (suicidal ideations/suicide attempt).  With written consent from the patient, the family member/significant other has been provided the following suicide prevention education, prior to the and/or following the discharge of the patient.  Aunt advised she is aware of SPE and knows what to do in the event of an emergency.  The suicide prevention education provided includes the following:  Suicide risk factors  Suicide prevention and interventions  National Suicide Hotline telephone number  [facility] assessment telephone number  [facility] Emergency Assistance [place] and/or Residential Mobile Crisis Unit telephone number  Request made of family/significant other to:  Remove weapons (e.g., guns, rifles, knives), all items previously/currently identified as safety concern.   Aunt advised patient does not have access to weapons.    Remove drugs/medications (over-the-counter, prescriptions, illicit drugs), all items previously/currently identified as a safety concern.  The family member/significant other verbalizes understanding of the suicide prevention education information provided.  The family member/significant other agrees to remove the items of safety concern listed above.  Concha Pyo 04/26/2014, 12:45 PM

## 2014-04-26 NOTE — Progress Notes (Signed)
  [facility] Adult Case Management Discharge Plan :  Will you be returning to the same living situation after discharge:  Yes,  Patient is discharging to Aunt's home. At discharge, do you have transportation home?: Yes,  Patient assisted with bus pass. Do you have the ability to pay for your medications: No.  Patient needs assistance with indigent medications   Release of information consent forms completed and in the chart;  Patient's signature needed at discharge.  Patient to Follow up at: Follow-up Information    Follow up with [facility] On 04/29/2014.   Why:  Please go to [facility]'s walk in clinic on Monday, April 29, 2014 or any weekday between [place] for medication management   Contact information:   [address] [place], [place]   [address]  [phone]      Patient denies SI/HI: Patient no longer endorsing SI/HI or other thoughts of self harm.   Safety Planning and Suicide Prevention discussed: .Reviewed with all patients during discharge planning group  Have you used any form of tobacco in the last 30 days? (Cigarettes, Smokeless Tobacco, Cigars, and/or Pipes): No  Has patient been referred to the Quitline?: No referral needed to Quitline.  Concha Pyo 04/26/2014, 12:37 PM

## 2014-04-26 NOTE — Progress Notes (Signed)
Patient ID: John [name], male   DOB: [birth year], 44 y.o.   MRN: [id number]  D: Patient reports he feels ready for discharge. No SI at present and symptoms stable. Treatment team felt patient is ready for discharge. A: Patient obtained all belongings, prescriptions, sample meds, and follow-up appointment. R: Given bus pass but may ride some of the distance with another patient being discharged.

## 2014-04-26 NOTE — Tx Team (Signed)
Interdisciplinary Treatment Plan Update   Date Reviewed:  04/26/2014  Time Reviewed:  10:13 AM  Progress in Treatment:   Attending groups: Yes, patient has attended groups. Participating in groups: Yes, engaged in discussions Taking medication as prescribed: Yes  Tolerating medication: Yes Family/Significant other contact made:  Yes, collateral contact with aunt. Patient understands diagnosis: Yes, patient understands diagnosis and need for treatment. Discussing patient identified problems/goals with staff: Yes, patient is able to express goals for treatment and discharge. Medical problems stabilized or resolved: Yes Denies suicidal/homicidal ideation:Yes. Patient has not harmed self or others: Yes  For review of initial/current patient goals, please see plan of care.  Estimated Length of Stay:  Discharge today  Reasons for Continued Hospitalization:   New Problems/Goals identified:    Discharge Plan or Barriers:   Home with outpatient follow up with [facility] walk in clinic  Additional Comments:     Patient and CSW reviewed patient's identified goals and treatment plan.  Patient verbalized understanding and agreed to treatment plan.   Attendees:  Patient:  04/26/2014 10:13 AM   Signature:  Gabriel Earing, MD 04/26/2014 10:13 AM  Signature: Carlton Adam, MD 04/26/2014 10:13 AM  Signature:  Loletta Specter,  RN 04/26/2014 10:13 AM  Signature:  04/26/2014 10:13 AM  Signature:   04/26/2014 10:13 AM  Signature:  Joette Catching, LCSW 04/26/2014 10:13 AM  Signature:  Erasmo Downer Drinkard, LCSW-A 04/26/2014 10:13 AM  Signature:  Lucinda Dell, Care Coordinator [facility] 04/26/2014 10:13 AM  Signature:   04/26/2014 10:13 AM  Signature:  04/26/2014  10:13 AM  Signature:   Lars Pinks, RN [facility] 04/26/2014  10:13 AM  Signature:  04/26/2014  10:13 AM    Scribe for Treatment Team:   Joette Catching,  04/26/2014 10:13 AM
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
## 2014-04-26 NOTE — BHH Group Notes (Signed)
[facility] LCSW Aftercare Discharge Planning Group Note   04/26/2014 12:41 PM    Participation Quality:  Per, MSW, patient did not attend discharge planning group.  Isabellah Sobocinski, Eulas Post

## 2014-04-30 ENCOUNTER — Ambulatory Visit (INDEPENDENT_AMBULATORY_CARE_PROVIDER_SITE_OTHER): Payer: Self-pay | Admitting: *Deleted

## 2014-04-30 VITALS — BP 113/76 | HR 89 | Temp 98.3°F | Resp 16 | Wt 199.8 lb

## 2014-04-30 DIAGNOSIS — Z006 Encounter for examination for normal comparison and control in clinical research program: Secondary | ICD-10-CM

## 2014-04-30 LAB — CD4/CD8 (T-HELPER/T-SUPPRESSOR CELL)
CD4 T CELL HELPER: 25.9
CD4: 466
CD8 % Suppressor T Cell: 52.1
CD8: 938

## 2014-04-30 NOTE — Progress Notes (Signed)
John [name] is here for his month 61 START study visit. He was in detox from 2/4 to 2/12, seems a little anxious, but says he is trying to stay away from alcohol. He had not been to [facility] yet but I told him they have a walk-in clinic until 3pm everyday, so he will try to get by there today. He says he is not as depressed, sleep is still troublesome for him. His viral load from 2/5 is <20 and previous one on 12/23 was also < 20. His aunt has been keeping up with his medications and trying to make sure he takes them. He will return in June for the next study visit and he has an appt. With Dr. Tommy Medal on March 30th.

## 2014-05-01 ENCOUNTER — Encounter: Payer: Self-pay | Admitting: Infectious Disease

## 2014-05-01 NOTE — Progress Notes (Signed)
Patient Discharge Instructions:  After Visit Summary (AVS):   Faxed to:  05/01/14 Discharge Summary Note:   Faxed to:  05/01/14 Psychiatric Admission Assessment Note:   Faxed to:  05/01/14 Suicide Risk Assessment - Discharge Assessment:   Faxed to:  05/01/14 Faxed/Sent to the Next Level Care provider:  05/01/14 Faxed to [facility] [place], 05/01/2014, 2:12 PM

## 2014-05-15 ENCOUNTER — Encounter: Payer: Self-pay | Admitting: Internal Medicine

## 2014-05-23 ENCOUNTER — Encounter (HOSPITAL_COMMUNITY): Payer: Self-pay

## 2014-05-23 ENCOUNTER — Emergency Department (HOSPITAL_COMMUNITY)
Admission: EM | Admit: 2014-05-23 | Discharge: 2014-05-26 | Disposition: A | Discharge status: Home / Self Care | Payer: Self-pay | Attending: Emergency Medicine | Admitting: Emergency Medicine

## 2014-05-23 DIAGNOSIS — Z720 Tobacco use: Secondary | ICD-10-CM | POA: Insufficient documentation

## 2014-05-23 DIAGNOSIS — Z8739 Personal history of other diseases of the musculoskeletal system and connective tissue: Secondary | ICD-10-CM | POA: Insufficient documentation

## 2014-05-23 DIAGNOSIS — R45850 Homicidal ideations: Secondary | ICD-10-CM | POA: Insufficient documentation

## 2014-05-23 DIAGNOSIS — J45909 Unspecified asthma, uncomplicated: Secondary | ICD-10-CM | POA: Insufficient documentation

## 2014-05-23 DIAGNOSIS — R45851 Suicidal ideations: Secondary | ICD-10-CM

## 2014-05-23 DIAGNOSIS — G8929 Other chronic pain: Secondary | ICD-10-CM | POA: Insufficient documentation

## 2014-05-23 DIAGNOSIS — Z7951 Long term (current) use of inhaled steroids: Secondary | ICD-10-CM | POA: Insufficient documentation

## 2014-05-23 DIAGNOSIS — Z590 Homelessness: Secondary | ICD-10-CM | POA: Insufficient documentation

## 2014-05-23 DIAGNOSIS — I10 Essential (primary) hypertension: Secondary | ICD-10-CM | POA: Insufficient documentation

## 2014-05-23 DIAGNOSIS — Z21 Asymptomatic human immunodeficiency virus [HIV] infection status: Secondary | ICD-10-CM | POA: Insufficient documentation

## 2014-05-23 DIAGNOSIS — Z79899 Other long term (current) drug therapy: Secondary | ICD-10-CM | POA: Insufficient documentation

## 2014-05-23 LAB — CBC
HEMATOCRIT: 37.3 % — AB (ref 39.0–52.0)
Hemoglobin: 13 g/dL (ref 13.0–17.0)
MCH: 33.1 pg (ref 26.0–34.0)
MCHC: 34.9 g/dL (ref 30.0–36.0)
MCV: 94.9 fL (ref 78.0–100.0)
PLATELETS: 269 10*3/uL (ref 150–400)
RBC: 3.93 MIL/uL — ABNORMAL LOW (ref 4.22–5.81)
RDW: 13.6 % (ref 11.5–15.5)
WBC: 5.1 10*3/uL (ref 4.0–10.5)

## 2014-05-23 LAB — COMPREHENSIVE METABOLIC PANEL
ALBUMIN: 4 g/dL (ref 3.5–5.2)
ALT: 20 U/L (ref 0–53)
AST: 24 U/L (ref 0–37)
Alkaline Phosphatase: 85 U/L (ref 39–117)
Anion gap: 12 (ref 5–15)
BUN: 14 mg/dL (ref 6–23)
CALCIUM: 8.9 mg/dL (ref 8.4–10.5)
CO2: 23 mmol/L (ref 19–32)
Chloride: 103 mmol/L (ref 96–112)
Creatinine, Ser: 1.16 mg/dL (ref 0.50–1.35)
GFR calc Af Amer: 88 mL/min — ABNORMAL LOW (ref >90)
GFR calc non Af Amer: 76 mL/min — ABNORMAL LOW (ref >90)
GLUCOSE: 82 mg/dL (ref 70–99)
Potassium: 3.8 mmol/L (ref 3.5–5.1)
SODIUM: 138 mmol/L (ref 135–145)
TOTAL PROTEIN: 7.3 g/dL (ref 6.0–8.3)
Total Bilirubin: 0.7 mg/dL (ref 0.3–1.2)

## 2014-05-23 LAB — ETHANOL

## 2014-05-23 LAB — ACETAMINOPHEN LEVEL: Acetaminophen (Tylenol), Serum: <10 ug/mL — ABNORMAL LOW (ref 10–30)

## 2014-05-23 LAB — SALICYLATE LEVEL

## 2014-05-23 MED ORDER — IBUPROFEN 400 MG PO TABS
600.0000 mg | ORAL_TABLET | Freq: Three times a day (TID) | ORAL | Status: DC | PRN
  Administered 2014-05-24 – 2014-05-26 (×5): 600 mg via ORAL
  Filled 2014-05-23 (×14): qty 1

## 2014-05-23 MED ORDER — ONDANSETRON HCL 4 MG PO TABS: 4.0000 mg | ORAL_TABLET | Freq: Three times a day (TID) | ORAL | Status: DC | PRN

## 2014-05-23 MED ORDER — ALUM & MAG HYDROXIDE-SIMETH 200-200-20 MG/5ML PO SUSP: 30.0000 mL | ORAL | Status: DC | PRN

## 2014-05-23 MED ORDER — ACETAMINOPHEN 325 MG PO TABS
650.0000 mg | ORAL_TABLET | ORAL | Status: DC | PRN
  Administered 2014-05-23 – 2014-05-26 (×4): 650 mg via ORAL
  Filled 2014-05-23 (×4): qty 2

## 2014-05-23 MED ORDER — LORAZEPAM 1 MG PO TABS: 1.0000 mg | ORAL_TABLET | Freq: Three times a day (TID) | ORAL | Status: DC | PRN

## 2014-05-23 MED ORDER — LORAZEPAM 1 MG PO TABS
1.0000 mg | ORAL_TABLET | Freq: Once | ORAL | Status: AC
  Administered 2014-05-23: 1 mg via ORAL
  Filled 2014-05-23: qty 1

## 2014-05-23 MED ORDER — ZOLPIDEM TARTRATE 5 MG PO TABS
5.0000 mg | ORAL_TABLET | Freq: Every evening | ORAL | Status: DC | PRN
Start: 2014-05-23 — End: 2014-05-26
  Administered 2014-05-23 – 2014-05-24 (×2): 5 mg via ORAL
  Filled 2014-05-23 (×2): qty 1

## 2014-05-23 NOTE — ED Notes (Signed)
Reminded pt that we needed a urine sample. Pt stated he did not have to urinate, but would let me know when ready.

## 2014-05-23 NOTE — ED Notes (Signed)
Meal tray ordered 

## 2014-05-23 NOTE — ED Notes (Signed)
PT consulting with TTS

## 2014-05-23 NOTE — ED Notes (Signed)
Pt's belongings inventoried and placed in bin. Pt's valuables given to security.

## 2014-05-23 NOTE — ED Notes (Signed)
Pt placed in wine scrubs and wanded by security. Staffing notified for sitter.
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
## 2014-05-23 NOTE — ED Notes (Signed)
Gave pt a soda.

## 2014-05-23 NOTE — ED Notes (Signed)
TTS at the bedside. 

## 2014-05-23 NOTE — ED Notes (Signed)
Pt reporting SI/HI.  Pt is IVC by [employer].  Sts he wants to stab himself and others; sts voices are telling him to harm others. [employer] at bedside.

## 2014-05-23 NOTE — ED Provider Notes (Signed)
CSN: [id number]     Arrival date & time 05/23/14  1725 History   First MD Initiated Contact with Patient 05/23/14 1821     Chief Complaint  Patient presents with  . Suicidal  . Homicidal     (Consider location/radiation/quality/duration/timing/severity/associated sxs/prior Treatment) HPI  John [name] is a 44 y.o. male with PMH of HIV, anxiety, depression, alcohol use, cocaine and marijuana abuse presenting with suicidal thoughts and homicidal ideation. Patient states he would take a knife to his neck or head to kill himself and he would also do the same to someone else. He states he is hearing voices that are telling him to hurt others. Patient states he is followed by [facility] but has not been taking any of his medications. He states the last time he was hospitalized was over a month ago. Patient denies any alcohol use and had some marijuana but denies any other illicit drug use. Patient IVC'd by [employer].   Past Medical History  Diagnosis Date  . Asthma   . HIV (human immunodeficiency virus infection)     "dx'd ~ 2 yr ago" (09/29/2012)  . Mental disorder   . Anxiety   . Depression   . Chronic low back pain   . Alcohol abuse   . HIV disease   . Hypertension   . Asthma   . Gout   . Alcoholism   . Homelessness   . Cocaine abuse   . Marijuana abuse    Past Surgical History  Procedure Laterality Date  . Skin graft full thickness leg Left ?    POSTERIOR LEFT LEG  AFTER DOG BITES   Family History  Problem Relation Age of Onset  . Alcoholism Mother   . Alcoholism Brother    History  Substance Use Topics  . Smoking status: Current Every Day Smoker -- 0.50 packs/day for 27 years    Types: Cigarettes  . Smokeless tobacco: Not on file  . Alcohol Use: 29.4 oz/week    49 Cans of beer per week     Comment: social    Review of Systems  Constitutional: Negative for fever and chills.  Eyes: Negative for visual disturbance.  Respiratory: Negative for chest tightness and  shortness of breath.   Cardiovascular: Negative for chest pain and palpitations.  Gastrointestinal: Negative for nausea, vomiting and abdominal pain.  Musculoskeletal: Negative for back pain.  Skin: Negative for color change and wound.  Neurological: Negative for weakness, numbness and headaches.      Allergies  Shellfish allergy  Home Medications   Prior to Admission medications   Medication Sig Start Date End Date Taking? Authorizing Provider  albuterol (PROVENTIL HFA;VENTOLIN HFA) 108 (90 BASE) MCG/ACT inhaler Inhale 2 puffs into the lungs every 4 (four) hours as needed for wheezing or shortness of breath. 04/26/14  Yes Encarnacion Slates, NP  Darunavir Ethanolate (PREZISTA) 800 MG tablet Take 1 tablet (800 mg total) by mouth daily with breakfast. For HIV infection 04/26/14  Yes Encarnacion Slates, NP  divalproex (DEPAKOTE ER) 500 MG 24 hr tablet Take 1 tablet (500 mg) in AM daily & 2 tablets (1,000 mg) at bedtime: For mood stabilization Patient taking differently: Take 500-1,000 mg by mouth 2 (two) times daily. Take 1 tablet (500 mg) in AM daily & 2 tablets (1,000 mg) at bedtime: For mood stabilization 04/26/14  Yes Encarnacion Slates, NP  elvitegravir-cobicistat-emtricitabine-tenofovir (STRIBILD) 150-150-200-300 MG TABS tablet Take 1 tablet by mouth daily with breakfast. For HIV infection 04/26/14  Yes Encarnacion Slates, NP  gabapentin (NEURONTIN) 100 MG capsule Take 2 capsules (200 mg total) by mouth 3 (three) times daily. For agitation/substance withdrawal syndrome 04/26/14  Yes Encarnacion Slates, NP  mometasone-formoterol (DULERA) 100-5 MCG/ACT AERO Inhale 2 puffs into the lungs 2 (two) times daily. For shortness of breath/Asthma 04/26/14  Yes Encarnacion Slates, NP  QUEtiapine (SEROQUEL) 400 MG tablet Take 400 mg by mouth at bedtime.   Yes Historical Provider, MD  QUEtiapine (SEROQUEL) 50 MG tablet Take 1 tablet (50 mg total) by mouth daily. For agitation/mood control Patient taking differently: Take 100 mg by  mouth every morning. For agitation/mood control 04/26/14  Yes Encarnacion Slates, NP  traZODone (DESYREL) 50 MG tablet Take 1 tablet (50 mg) at bedtime for sleep Patient taking differently: Take 50 mg by mouth at bedtime. Take 1 tablet (50 mg) at bedtime for sleep 04/26/14  Yes Encarnacion Slates, NP  QUEtiapine (SEROQUEL) 300 MG tablet Take 1 tablet (300 mg total) by mouth at bedtime. For mood control 04/26/14   Encarnacion Slates, NP   BP 121/77 mmHg  Pulse 66  Temp(Src) 98.7 F (37.1 C) (Oral)  Resp 18  SpO2 99% Physical Exam  Constitutional: He is oriented to person, place, and time. He appears well-developed and well-nourished. No distress.  HENT:  Head: Normocephalic and atraumatic.  Eyes: Conjunctivae and EOM are normal. Right eye exhibits no discharge. Left eye exhibits no discharge.  Cardiovascular: Normal rate and regular rhythm.   Pulmonary/Chest: Effort normal and breath sounds normal. No respiratory distress. He has no wheezes.  Abdominal: Soft. Bowel sounds are normal. He exhibits no distension. There is no tenderness.  Neurological: He is alert and oriented to person, place, and time. He exhibits normal muscle tone. Coordination normal.  Speech fluent and goal oriented. Patient moves all four extremities without any focal neurological deficits and has no facial droop. Normal gait.  Skin: Skin is warm and dry. He is not diaphoretic.  Psychiatric: His speech is normal. He is agitated. He is not combative. He expresses homicidal and suicidal ideation.  Nursing note and vitals reviewed.   ED Course  Procedures (including critical care time) Labs Review Labs Reviewed  ACETAMINOPHEN LEVEL - Abnormal; Notable for the following:    Acetaminophen (Tylenol), Serum <10.0 (*)    All other components within normal limits  CBC - Abnormal; Notable for the following:    RBC 3.93 (*)    HCT 37.3 (*)    All other components within normal limits  COMPREHENSIVE METABOLIC PANEL - Abnormal; Notable for  the following:    GFR calc non Af Amer 76 (*)    GFR calc Af Amer 88 (*)    All other components within normal limits  ETHANOL  SALICYLATE LEVEL  URINE RAPID DRUG SCREEN (HOSP PERFORMED)    Imaging Review No results found.   EKG Interpretation None      MDM   Final diagnoses:  Homicidal ideation  Suicidal ideation   Patient presenting with homicidal and suicidal ideation with plan to use a knife to kill himself. VSS. Patient in no acute distress. Labwork reassuring. Patient without any other medical complaints. He is stable for behavioral health evaluation. Patient has been IVC by [employer].    Al Corpus, PA-C 05/23/14 [place], MD 05/26/14 [phone]

## 2014-05-23 NOTE — ED Notes (Signed)
Meal tray arrived

## 2014-05-23 NOTE — Progress Notes (Addendum)
CSW faxed patient referral to the following inpatient facilities with potential beds available: [facility], [facility], [address], [place], [facility], [facility], [place], [place].  Will continue to follow up pt referrals.  Verlon Setting, [place] Disposition staff 05/23/2014 11:29 PM

## 2014-05-24 LAB — RAPID URINE DRUG SCREEN, HOSP PERFORMED
AMPHETAMINES: NOT DETECTED
BENZODIAZEPINES: NOT DETECTED
Barbiturates: NOT DETECTED
Cocaine: NOT DETECTED
OPIATES: NOT DETECTED
TETRAHYDROCANNABINOL: POSITIVE — AB

## 2014-05-24 MED ORDER — DIVALPROEX SODIUM ER 500 MG PO TB24
500.0000 mg | ORAL_TABLET | Freq: Every day | ORAL | Status: DC
  Administered 2014-05-24 – 2014-05-26 (×3): 500 mg via ORAL
  Filled 2014-05-24 (×3): qty 1

## 2014-05-24 MED ORDER — ELVITEG-COBIC-EMTRICIT-TENOFAF 150-150-200-10 MG PO TABS
1.0000 | ORAL_TABLET | Freq: Every day | ORAL | Status: DC
  Administered 2014-05-24 – 2014-05-26 (×3): 1 via ORAL
  Filled 2014-05-24 (×4): qty 1

## 2014-05-24 MED ORDER — DARUNAVIR ETHANOLATE 800 MG PO TABS
800.0000 mg | ORAL_TABLET | Freq: Every day | ORAL | Status: DC
  Administered 2014-05-24 – 2014-05-26 (×3): 800 mg via ORAL
  Filled 2014-05-24 (×4): qty 1

## 2014-05-24 MED ORDER — GABAPENTIN 100 MG PO CAPS
200.0000 mg | ORAL_CAPSULE | Freq: Three times a day (TID) | ORAL | Status: DC
  Administered 2014-05-24 – 2014-05-26 (×8): 200 mg via ORAL
  Filled 2014-05-24 (×9): qty 2

## 2014-05-24 MED ORDER — ALBUTEROL SULFATE HFA 108 (90 BASE) MCG/ACT IN AERS: 2.0000 | INHALATION_SPRAY | RESPIRATORY_TRACT | Status: DC | PRN

## 2014-05-24 MED ORDER — QUETIAPINE FUMARATE 25 MG PO TABS
300.0000 mg | ORAL_TABLET | Freq: Every day | ORAL | Status: DC
  Administered 2014-05-24 – 2014-05-25 (×2): 300 mg via ORAL
  Filled 2014-05-24: qty 1
  Filled 2014-05-24 (×2): qty 4
  Filled 2014-05-24: qty 1

## 2014-05-24 MED ORDER — DIVALPROEX SODIUM ER 500 MG PO TB24: 500.0000 mg | ORAL_TABLET | Freq: Two times a day (BID) | ORAL | Status: DC

## 2014-05-24 MED ORDER — MOMETASONE FURO-FORMOTEROL FUM 100-5 MCG/ACT IN AERO
2.0000 | INHALATION_SPRAY | Freq: Two times a day (BID) | RESPIRATORY_TRACT | Status: DC
  Administered 2014-05-24 – 2014-05-26 (×5): 2 via RESPIRATORY_TRACT
  Filled 2014-05-24: qty 8.8

## 2014-05-24 MED ORDER — TRAZODONE HCL 50 MG PO TABS
50.0000 mg | ORAL_TABLET | Freq: Every day | ORAL | Status: DC
  Administered 2014-05-24 – 2014-05-25 (×2): 50 mg via ORAL
  Filled 2014-05-24 (×3): qty 1

## 2014-05-24 MED ORDER — DIVALPROEX SODIUM ER 500 MG PO TB24
1000.0000 mg | ORAL_TABLET | Freq: Every day | ORAL | Status: DC
  Administered 2014-05-24 – 2014-05-25 (×2): 1000 mg via ORAL
  Filled 2014-05-24 (×3): qty 2

## 2014-05-24 NOTE — ED Provider Notes (Signed)
Resting comfortably this morning. No overnight problems per nursing staff.  Filed Vitals:   05/24/14 0645  BP: 109/70  Pulse: 83  Temp: 98.1 F (36.7 C)  Resp: 18     Orpah Greek, MD 05/24/14 1022

## 2014-05-24 NOTE — ED Notes (Signed)
Pt resting with eyes closed; pt ate 100% of dinner meal.

## 2014-05-24 NOTE — Progress Notes (Signed)
CSW called to follow up with pt referrals. [place]-per Acquanetta Chain, will call back re: referrals [facility]- advised CSW to call back 3/11 afternoon  [facility] declined [facility]- faxed referral [facility]- no answer [facility]- at capacity but faxed referral for wait-list review [facility]- re-faxed referral as [facility] (intake) had understood pt no longer needed placement. Referral now under review [facility] [place]- Per [place], pt declined  Sharren Bridge, MSW, [place] Disposition Office

## 2014-05-24 NOTE — Progress Notes (Addendum)
Pt referrals made at the following hospitals: [facility], [address] [facility], [facility],  [facility], [place].  At capacity: [facility] - at capacity per [facility] - no answer. Per morning shift hospital was at capacity. [facility] [employer], at capacity for adults, might have one bed opening for child under 27.  Will continue to follow up pt. referrals.  Verlon Setting, [place] Disposition staff 05/24/2014 6:38 PM

## 2014-05-24 NOTE — ED Notes (Signed)
Pt reports "the voices are getting louder," states ambien may help decreased the voices

## 2014-05-25 DIAGNOSIS — R45851 Suicidal ideations: Secondary | ICD-10-CM

## 2014-05-25 NOTE — ED Provider Notes (Signed)
Pt resting comfortably. Nad.  Filed Vitals:   05/25/14 0602  BP: 117/68  Pulse: 76  Temp: 98.3 F (36.8 C)  Resp: 20   [facility] team eval/placement remains pending.    Lajean Saver, MD 05/25/14 [phone]

## 2014-05-25 NOTE — ED Notes (Signed)
Ibuprofen to be given for pain per Dr.Yelverton

## 2014-05-25 NOTE — Progress Notes (Signed)
CSW follow up on placement attempts made by previous shift and made new referrals.  Declined: [facility]: [facility] due to IPRS  Pending: [facility]: [facility] (Refaxed as did not have referral) [place]: [facility] have referrral will review today   Waitlist For Acceptance Pending Available Bed: [facility] per [place]: [facility]: [facility] [place]: [facility]: [facility] reported did not have referral but did not have beds today [place]    CSW will monitor.  Hillery Hunter, LCSW Disposition Social Worker [phone]
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
## 2014-05-25 NOTE — ED Notes (Signed)
Pt resting with eyes closed; easily arousable. No needs expressed at this time. Respirations even and unlabored

## 2014-05-25 NOTE — BH Assessment (Signed)
John [name], [name] at [name], confirms adult unit is currently at capacity. Contacted the following facilities for placement:  PT ACCEPTED TO WAIT LIST: John [name]  AT CAPACITY: John [name], per John [name], per John [name] (No beds until Monday) John [name], per John [name], per John [name], per John [name], per John [name], per John [name], per John [name] (No beds until Monday) John [name], per John [name], per John [name], per John [name] (No beds until Monday) John [name], per John [name], per John [name], per John [name], per John [name], per John [name] [name] [place], per [name], per [name] [place], per [place]  PT DECLINED: [name], [name] Triage Specialist [phone]
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
## 2014-05-26 NOTE — ED Notes (Signed)
Pt talking w/Conrad, NP, [facility], on phone d/t telepsych machine not working.

## 2014-05-26 NOTE — Discharge Instructions (Signed)
°Emergency Department Resource Guide °1) Find a Doctor and Pay Out of Pocket °Although you won't have to find out who is covered by your insurance plan, it is a good idea to ask around and get recommendations. You will then need to call the office and see if the doctor you have chosen will accept you as a new patient and what types of options they offer for patients who are self-pay. Some doctors offer discounts or will set up payment plans for their patients who do not have insurance, but you will need to ask so you aren't surprised when you get to your appointment. ° °2) Contact Your Local Health Department °Not all health departments have doctors that can see patients for sick visits, but many do, so it is worth a call to see if yours does. If you don't know where your local health department is, you can check in your phone book. The CDC also has a tool to help you locate your state's health department, and many state websites also have listings of all of their local health departments. ° °3) Find a Walk-in Clinic °If your illness is not likely to be very severe or complicated, you may want to try a walk in clinic. These are popping up all over the country in pharmacies, drugstores, and shopping centers. They're usually staffed by nurse practitioners or physician assistants that have been trained to treat common illnesses and complaints. They're usually fairly quick and inexpensive. However, if you have serious medical issues or chronic medical problems, these are probably not your best option. ° °No Primary Care Doctor: °- Call [facility] at  832-8000 - they can help you locate a primary care doctor that  accepts your insurance, provides certain services, etc. °- Physician Referral Service- 1-800-533-3463 ° °Chronic Pain Problems: °Organization         Address  Phone   Notes  °[facility]  (336) 297-2271 Patients need to be referred by their primary care doctor.  ° °Medication  Assistance: °Organization         Address  Phone   Notes  °[place] [facility] [address] °[place], [place] [address] (336) 641-8030 --Must be a resident of [place] °-- Must have NO insurance coverage whatsoever (no Medicaid/ Medicare, etc.) °-- The pt. MUST have a primary care doctor that directs their care regularly and follows them in the community °  °[facility]  (866) 331-1348   °[employer]  (888) 892-1162   ° °Agencies that provide inexpensive medical care: °Organization         Address  Phone   Notes  °[facility]  (336) 832-8035   °[facility]    (336) 832-7272   °[facility] [address] °[place] [address] (336) 832-4777   °[facility] [address], °[place] (336) 271-4999   °[facility]    (336) 373-0678   °[facility]    (336) 272-1050   °[facility] ° [address], [place] Phone:  (336) 832-4444, Fax:  (336) 832-4440 Hours of Operation:  9 am - 6 pm, M-F.  Also accepts Medicaid/Medicare and self-pay.  °[facility] ° [address], [place] Phone: (336) 832-3150, Fax: (336) 832-3151. Hours of Operation:  8:30 am - 5:30 pm, M-F.  Also accepts Medicaid and self-pay.  °[facility] [place] [address], [place] Phone: (336) 878-6027   °[facility] [address], [place], [place] (336)723-1848, Ext. 123 Mondays & Thursdays: 7-9 AM.  First 15 patients are seen on a first come, first serve basis. °  ° °Medicaid-accepting [place] Providers: ° °Organization         Address  Phone   Notes  °[facility] [address], [place] (336) 641-2100 Also accepts self-pay patients.  °[facility] [address], [place] ° (336) 856-9996   °[facility] [address], [place]  (336) 288-8857   °[facility] [address] [place] [address], [place] (336) 299-7000   °Veita Bland [address], [place]  ° (336) 373-1557 Only accepts [place] Access Medicaid patients after they have their name applied to their card.  ° °Self-Pay (no insurance) in [place]: ° °Organization         Address  Phone   Notes  °Sickle Cell Patients, [facility] [address], [place] (336) 832-1970   °[facility] [address], [place] (336) 832-4400   °[facility] ° [address], [place] (336) 992-4800   °[facility]/Dr. Osei-Bonsu ° [address] [place] [address], [place] or [address], [place] (336) 841-8500 Phone number for both [place] and [place] locations is the same.  °[facility] [address], [place] (336) 299-0000   °[facility] [address] [place] [address], [place] or [address] (336) 852-7530 °(336) 878-2260   °[facility] [address], [place] (336) 350-1642, phone; (336) 294-5005, fax Sees patients 1st and 3rd Saturday of every month.  Must not qualify for public or private insurance (i.e. Medicaid, Medicare, [place] Health Choice, Veterans' Benefits) • Household income should be no more than 200% of the poverty level •The clinic cannot treat you if you are pregnant or think you are pregnant • Sexually transmitted diseases are not treated at the clinic.  ° ° °Dental Care: °Organization         Address  Phone  Notes  °[place] [facility] [address],  (336) 641-6152 Accepts children up to age 21 who are enrolled in Medicaid or [place] Health Choice; pregnant women with a Medicaid card; and children who have applied for Medicaid or [place] Health Choice, but were declined, whose parents can pay a reduced fee at time of service.  °[place]  [facility] [place]  [address], [place] (336) 641-7733 Accepts children up to age 21 who are enrolled in Medicaid or [place] Health Choice; pregnant women with a Medicaid card; and children who have applied for Medicaid or [place] Health Choice, but were declined, whose parents can pay a reduced fee at time of service.  °[facility] ° [address],  (336) 641-4533 Patients are seen by appointment only. Walk-ins are not accepted. [facility] will see patients 18 years of age and older. °Monday - Tuesday (8am-5pm) °Most Wednesdays (8:30-5pm) °$30 per visit, cash only  °[facility] ° [address], [place] (336) 641-4533 Patients are seen by appointment only. Walk-ins are not accepted. [facility] will see patients 18 years of age and older. °One   Wednesday Evening (Monthly: Volunteer Based).  $30 per visit, cash only  °[facility]  (919) 537-3737 for adults; Children under age 4, call Graduate Pediatric Dentistry at ([phone]) [phone]. Children aged 4-14, please call (919) [phone] to request a pediatric application. ° Dental services are provided in all areas of dental care including fillings, crowns and bridges, complete and partial dentures, implants, gum treatment, root canals, and extractions. Preventive care is also provided. Treatment is provided to both adults and children. °Patients are selected via a lottery and there is often a waiting list. °  °[facility] [address], °[place] ° (336) 763-8833 [email or web address] °  °[facility] [address], [place], [place] (336)723-1848, Ext. 123 Second and Fourth Thursday of each month, opens at 6:30 AM; Clinic ends at 9 AM.  Patients are seen on a first-come first-served basis, and a limited number are seen during each clinic.  ° °[facility] ° [address], [place], [place] (336) 723-7904    Eligibility Requirements °You must have lived in [place], [place], or [place] counties for at least the last three months. °  You cannot be eligible for state or federal sponsored healthcare insurance, including Veterans Administration, Medicaid, or Medicare. °  You generally cannot be eligible for healthcare insurance through your employer.  °  How to apply: °Eligibility screenings are held every Tuesday and Wednesday afternoon from 1:00 pm until 4:00 pm. You do not need an appointment for the interview!  °[facility] [address], [place], [place] 336-631-2330   °[facility]  336-342-8273   °[facility]  336-703-3100   °[facility]  336-570-6415   ° °Behavioral Health Resources in the Community: °Intensive Outpatient Programs °Organization         Address  Phone  Notes  °[facility] [address], [place], [place] 336-878-6098   °[facility] [address], [place], [place] 336-832-9800   °[facility] [address], [place], [place] ° 336-882-2125   °[facility] [address],  °[place], [place] 1-800-853-5163 or 336-641-4981   °Substance Abuse Resources °Organization         Address  Phone  Notes  °[facility]  336-882-2125   °[facility]  336-784-9470   °[name]  336-285-9073   °[facility]  336-845-3988   °Residential & Outpatient Substance Abuse Program  1-800-659-3381   °Psychological Services °Organization         Address  Phone  Notes  °[facility]  336- 832-9600   °[facility]  336- 378-7881   °[facility] [address], [place] 1-800-853-5163 or 336-641-4981   ° °Mobile Crisis Teams °Organization         Address  Phone  Notes  °[facility], Mobile Crisis Care Unit  1-877-626-1772   °Assertive °[facility] ° [address]  [place], [place] 336-834-9664   °Sharon DeEsch [address] °[place] 336-554-5454   ° °Self-Help/Support Groups °Organization         Address  Phone             Notes  °[facility] [place] - variety of support groups  336- 373-1402 Call for more information  °Narcotics Anonymous (NA), [facility] [address], °[place]  2 meetings at this location  ° °  Residential Treatment Programs °Organization         Address  Phone  Notes  °[facility] [address],    °[place]  1-866-801-8205   °[facility] ° [address], [place], [place] 704-293-8524   °[facility] [address], [place] 336-845-3988 Admissions: 8am-3pm M-F  °[facility] [address]    °[place], [place] 336-841-1104   °[facility] [address], [place], [place] 336-379-7146   °[name] [address]  °[place], [place] 336-285-9073   °[facility] Intensive Outpatient [address], [place], [place] 336-852-3033   °[facility] [address]  °[place], [place] 1-877-615-2722 or 336-784-9470   °[facility] [address] [place], [place] 336-227-7417 Accepts Medicaid  °[facility] [address]  °[place] 1-800-659-3381 Substance Abuse/Addiction Treatment  ° °[place] Behavioral Health Resources °Organization         Address  Phone  Notes  °[facility]  (888) 581-9988   °Julie Brannon, PhD [address] [place], [place]   (336) 349-5553 or (336) 951-0000   °[facility]   [address] °[place], [place] (336) 349-4454   °[facility] [address], [place],  (336) 342-8316 Insurance/Medicaid/sponsorship through [facility]  °[facility] [address]                                    [place],  (336) 342-8316 Therapy/tele-psych/case    °[facility] [address]  ° [place],  (336) 349-2233    °Dr. Arfeen  (336) 349-4544   °[facility] [place]  [employer] [place] [facility] 1) [address], [place] °2) [address], [place] °3)  [address], [place] (336) 349-3220 °(336) 342-7768 ° °(336) 342-8140   °[place] [facility] (336) 342-1394 or (336) 342-3537 (After Hours)    ° ° °

## 2014-05-26 NOTE — Progress Notes (Signed)
CSW met with this 44 y/o, single, male patient that presents with hospital garb.  Patient states, "I know I am being discharged."  Patient states he is feeling better and asks if he will need a referral to go back to [facility].  Patient told that he can walk-in tomorrow to [facility] with his discharge paperwork.  Patient has requested bus pass due to lack of transportation.  CSW will get patient a bus pass in order to return home and he will follow up with [facility] in the morning.  Patient shows no evidence of S/I, H/I, or any psychotic symptoms.  [facility] Cameren Earnest Richardo Priest ED CSW [phone]

## 2014-05-26 NOTE — ED Notes (Signed)
PT SIGNED "NO HARM CONTRACT". COPY FAXED TO [facility].

## 2014-05-26 NOTE — ED Provider Notes (Signed)
Pt evaluated this AM after c/o dental pain overnigth - lower R rear molar - no swelling, no redness, no ttp over teh tooth, no drainage, no LAD of the neck.  Continue nsaid / tylenol therapy.  Noemi Chapel, MD 05/26/14 [phone]

## 2014-05-26 NOTE — ED Notes (Addendum)
PT NOTED TO BE COOPERATIVE. NO SIGNS OR SYMPTOMS OF SELF-INJURYING BEHAVIORS. PT STATES HAS A-V/H - PT DOES NOT APPEAR TO BE RESPONDING TO ANY INTERNAL STIMULI.

## 2014-06-09 ENCOUNTER — Encounter (HOSPITAL_COMMUNITY): Payer: Self-pay | Admitting: Emergency Medicine

## 2014-06-09 ENCOUNTER — Emergency Department (HOSPITAL_COMMUNITY): Payer: Federal, State, Local not specified - Other

## 2014-06-09 ENCOUNTER — Emergency Department (HOSPITAL_COMMUNITY)
Admission: EM | Admit: 2014-06-09 | Discharge: 2014-06-09 | Disposition: A | Discharge status: Home / Self Care | Payer: Federal, State, Local not specified - Other | Attending: Emergency Medicine | Admitting: Emergency Medicine

## 2014-06-09 DIAGNOSIS — G8929 Other chronic pain: Secondary | ICD-10-CM | POA: Insufficient documentation

## 2014-06-09 DIAGNOSIS — Z720 Tobacco use: Secondary | ICD-10-CM | POA: Insufficient documentation

## 2014-06-09 DIAGNOSIS — Y9302 Activity, running: Secondary | ICD-10-CM | POA: Insufficient documentation

## 2014-06-09 DIAGNOSIS — Z590 Homelessness: Secondary | ICD-10-CM | POA: Insufficient documentation

## 2014-06-09 DIAGNOSIS — S76011A Strain of muscle, fascia and tendon of right hip, initial encounter: Secondary | ICD-10-CM | POA: Insufficient documentation

## 2014-06-09 DIAGNOSIS — F329 Major depressive disorder, single episode, unspecified: Secondary | ICD-10-CM | POA: Insufficient documentation

## 2014-06-09 DIAGNOSIS — W19XXXA Unspecified fall, initial encounter: Secondary | ICD-10-CM

## 2014-06-09 DIAGNOSIS — Y9289 Other specified places as the place of occurrence of the external cause: Secondary | ICD-10-CM | POA: Insufficient documentation

## 2014-06-09 DIAGNOSIS — B20 Human immunodeficiency virus [HIV] disease: Secondary | ICD-10-CM | POA: Insufficient documentation

## 2014-06-09 DIAGNOSIS — Y998 Other external cause status: Secondary | ICD-10-CM | POA: Insufficient documentation

## 2014-06-09 DIAGNOSIS — I10 Essential (primary) hypertension: Secondary | ICD-10-CM | POA: Insufficient documentation

## 2014-06-09 DIAGNOSIS — W010XXA Fall on same level from slipping, tripping and stumbling without subsequent striking against object, initial encounter: Secondary | ICD-10-CM | POA: Insufficient documentation

## 2014-06-09 DIAGNOSIS — Z79899 Other long term (current) drug therapy: Secondary | ICD-10-CM | POA: Insufficient documentation

## 2014-06-09 DIAGNOSIS — Z7951 Long term (current) use of inhaled steroids: Secondary | ICD-10-CM | POA: Insufficient documentation

## 2014-06-09 DIAGNOSIS — J45909 Unspecified asthma, uncomplicated: Secondary | ICD-10-CM | POA: Insufficient documentation

## 2014-06-09 DIAGNOSIS — F419 Anxiety disorder, unspecified: Secondary | ICD-10-CM | POA: Insufficient documentation

## 2014-06-09 MED ORDER — KETOROLAC TROMETHAMINE 60 MG/2ML IM SOLN
60.0000 mg | Freq: Once | INTRAMUSCULAR | Status: AC
  Administered 2014-06-09: 60 mg via INTRAMUSCULAR
  Filled 2014-06-09: qty 2

## 2014-06-09 NOTE — ED Notes (Signed)
Pt although with an unsteady gait is able to ambulate, states he would like to have crutches to walk on them, c/o of sharp shooting pain at this time.

## 2014-06-09 NOTE — ED Provider Notes (Signed)
CSN: [id number]     Arrival date & time 06/09/14  1441 History  This chart was scribed for a non-physician practitioner, Carman Ching, PA-C working with Debby Freiberg, MD by [place] Peace, ED Scribe. The patient was seen in W[facility]. The patient's care was started at 3:42 PM.    Chief Complaint  Patient presents with  . Fall      Patient is a 44 y.o. male presenting with fall. The history is provided by the patient. No language interpreter was used.  Fall  HPI Comments: John [name] is a 44 y.o. male who presents to the Emergency Department complaining of fall that occurred earlier today while pt was running today for over 2 hours when he reports he turned to wave at someone and he twisted his hip causing him to fall. He now complains of radiating right hip pain that extends into his lower leg. Rates pain currently as 12/10, exacerbated with movement and ambulation. Pt states that he is not able to walk. Denies numbness or tingling. Had not tried any alleviating factors. Pt is current everyday smoker.    Past Medical History  Diagnosis Date  . Asthma   . HIV (human immunodeficiency virus infection)     "dx'd ~ 2 yr ago" (09/29/2012)  . Mental disorder   . Anxiety   . Depression   . Chronic low back pain   . Alcohol abuse   . HIV disease   . Hypertension   . Asthma   . Gout   . Alcoholism   . Homelessness   . Cocaine abuse   . Marijuana abuse    Past Surgical History  Procedure Laterality Date  . Skin graft full thickness leg Left ?    POSTERIOR LEFT LEG  AFTER DOG BITES   Family History  Problem Relation Age of Onset  . Alcoholism Mother   . Alcoholism Brother    History  Substance Use Topics  . Smoking status: Current Every Day Smoker -- 0.50 packs/day for 27 years    Types: Cigarettes  . Smokeless tobacco: Not on file  . Alcohol Use: 29.4 oz/week    49 Cans of beer per week     Comment: social    Review of Systems  Musculoskeletal:       Right hip pain.    All other systems reviewed and are negative.     Allergies  Shellfish allergy  Home Medications   Prior to Admission medications   Medication Sig Start Date End Date Taking? Authorizing Provider  albuterol (PROVENTIL HFA;VENTOLIN HFA) 108 (90 BASE) MCG/ACT inhaler Inhale 2 puffs into the lungs every 4 (four) hours as needed for wheezing or shortness of breath. 04/26/14  Yes Encarnacion Slates, NP  Darunavir Ethanolate (PREZISTA) 800 MG tablet Take 1 tablet (800 mg total) by mouth daily with breakfast. For HIV infection 04/26/14  Yes Encarnacion Slates, NP  divalproex (DEPAKOTE ER) 500 MG 24 hr tablet Take 1 tablet (500 mg) in AM daily & 2 tablets (1,000 mg) at bedtime: For mood stabilization Patient taking differently: Take 500-1,000 mg by mouth 2 (two) times daily. Take 1 tablet (500 mg) in AM daily & 2 tablets (1,000 mg) at bedtime: For mood stabilization 04/26/14  Yes Encarnacion Slates, NP  elvitegravir-cobicistat-emtricitabine-tenofovir (STRIBILD) 150-150-200-300 MG TABS tablet Take 1 tablet by mouth daily with breakfast. For HIV infection 04/26/14  Yes Encarnacion Slates, NP  gabapentin (NEURONTIN) 100 MG capsule Take 2 capsules (200 mg total)  by mouth 3 (three) times daily. For agitation/substance withdrawal syndrome 04/26/14  Yes Encarnacion Slates, NP  mometasone-formoterol (DULERA) 100-5 MCG/ACT AERO Inhale 2 puffs into the lungs 2 (two) times daily. For shortness of breath/Asthma 04/26/14  Yes Encarnacion Slates, NP  QUEtiapine (SEROQUEL) 300 MG tablet Take 1 tablet (300 mg total) by mouth at bedtime. For mood control 04/26/14  Yes Encarnacion Slates, NP  QUEtiapine (SEROQUEL) 50 MG tablet Take 1 tablet (50 mg total) by mouth daily. For agitation/mood control Patient taking differently: Take 100 mg by mouth every morning. For agitation/mood control 04/26/14  Yes Encarnacion Slates, NP  traZODone (DESYREL) 50 MG tablet Take 1 tablet (50 mg) at bedtime for sleep Patient taking differently: Take 50 mg by mouth at bedtime. Take 1  tablet (50 mg) at bedtime for sleep 04/26/14  Yes Encarnacion Slates, NP   BP 122/71 mmHg  Pulse 79  Temp(Src) 98.1 F (36.7 C) (Oral)  Resp 18  SpO2 99% Physical Exam  Constitutional: He is oriented to person, place, and time. He appears well-developed and well-nourished. No distress.  HENT:  Head: Normocephalic and atraumatic.  Eyes: Conjunctivae and EOM are normal.  Neck: Normal range of motion. Neck supple.  Cardiovascular: Normal rate, regular rhythm and normal heart sounds.   Pulses:      Dorsalis pedis pulses are 2+ on the right side.       Posterior tibial pulses are 2+ on the right side.  Pulmonary/Chest: Effort normal and breath sounds normal.  Musculoskeletal: Normal range of motion. He exhibits no edema.  Right hip TTP both anterior and laterally. No swelling or deformity. No shortening. Full range of motion, pain with hip flexion and abduction. Low back normal. Knee and ankle normal.  Neurological: He is alert and oriented to person, place, and time.  Skin: Skin is warm and dry.  Psychiatric: He has a normal mood and affect. His behavior is normal.  Nursing note and vitals reviewed.   ED Course  Procedures (including critical care time) Labs Review Labs Reviewed - No data to display  Imaging Review Dg Hip Unilat  With Pelvis 2-3 Views Right  06/09/2014   CLINICAL DATA:  Right anterior hip pain, fall while jogging  EXAM: RIGHT HIP (WITH PELVIS) 2-3 VIEWS  COMPARISON:  None.  FINDINGS: No fracture or dislocation identified. Bony pelvis appears unremarkable. Normal visualized bowel gas pattern.  IMPRESSION: No acute osseous abnormality.   Electronically Signed   By: Conchita Paris M.D.   On: 06/09/2014 15:13     EKG Interpretation None     Medications  ketorolac (TORADOL) injection 60 mg (60 mg Intramuscular Given 06/09/14 1601)    3:45 PM- Treatment plan was discussed with patient who verbalizes understanding and agrees.   MDM   Final diagnoses:  Fall from slip,  trip, or stumble, initial encounter  Hip strain, right, initial encounter   NAD. Neurovascularly intact distally. No associated back injury. Continuously asking for a sandwich in the ED. X-ray negative. Toradol shot given. Patient able to ambulate, however reports he feels comfortable if he were to have crutches. Crutches given. Advised follow-up with PCP. Stable for discharge. Return precautions given. Patient states understanding of treatment care plan and is agreeable.  I personally performed the services described in this documentation, which was scribed in my presence. The recorded information has been reviewed and is accurate.  Carman Ching, PA-C 06/09/14 1614  Debby Freiberg, MD 06/10/14 [phone]

## 2014-06-09 NOTE — ED Notes (Signed)
Pt picked up by [facility] from [facility].  Pt states that he was running, slid and fell onto his right side onto his hip.  States that he twisted when he fell.  Now c/o rt hip pain.

## 2014-06-09 NOTE — Discharge Instructions (Signed)
Apply ice to your hip. Use crutches for comfort. Take ibuprofen or Tylenol for your pain. Follow-up with your primary care physician.  Hip Pain Your hip is the joint between your upper legs and your lower pelvis. The bones, cartilage, tendons, and muscles of your hip joint perform a lot of work each day supporting your body weight and allowing you to move around. Hip pain can range from a minor ache to severe pain in one or both of your hips. Pain may be felt on the inside of the hip joint near the groin, or the outside near the buttocks and upper thigh. You may have swelling or stiffness as well.  HOME CARE INSTRUCTIONS   Take medicines only as directed by your health care provider.  Apply ice to the injured area:  Put ice in a plastic bag.  Place a towel between your skin and the bag.  Leave the ice on for 15-20 minutes at a time, 3-4 times a day.  Keep your leg raised (elevated) when possible to lessen swelling.  Avoid activities that cause pain.  Follow specific exercises as directed by your health care provider.  Sleep with a pillow between your legs on your most comfortable side.  Record how often you have hip pain, the location of the pain, and what it feels like. SEEK MEDICAL CARE IF:   You are unable to put weight on your leg.  Your hip is red or swollen or very tender to touch.  Your pain or swelling continues or worsens after 1 week.  You have increasing difficulty walking.  You have a fever. SEEK IMMEDIATE MEDICAL CARE IF:   You have fallen.  You have a sudden increase in pain and swelling in your hip. MAKE SURE YOU:   Understand these instructions.  Will watch your condition.  Will get help right away if you are not doing well or get worse. Document Released: 08/19/2009 Document Revised: 07/16/2013 Document Reviewed: 10/26/2012 [facility] Patient Information 2015 [place], [place]. This information is not intended to replace advice given to you by your  health care provider. Make sure you discuss any questions you have with your health care provider.  Muscle Strain A muscle strain is an injury that occurs when a muscle is stretched beyond its normal length. Usually a small number of muscle fibers are torn when this happens. Muscle strain is rated in degrees. First-degree strains have the least amount of muscle fiber tearing and pain. Second-degree and third-degree strains have increasingly more tearing and pain.  Usually, recovery from muscle strain takes 1-2 weeks. Complete healing takes 5-6 weeks.  CAUSES  Muscle strain happens when a sudden, violent force placed on a muscle stretches it too far. This may occur with lifting, sports, or a fall.  RISK FACTORS Muscle strain is especially common in athletes.  SIGNS AND SYMPTOMS At the site of the muscle strain, there may be:  Pain.  Bruising.  Swelling.  Difficulty using the muscle due to pain or lack of normal function. DIAGNOSIS  Your health care provider will perform a physical exam and ask about your medical history. TREATMENT  Often, the best treatment for a muscle strain is resting, icing, and applying cold compresses to the injured area.  HOME CARE INSTRUCTIONS   Use the PRICE method of treatment to promote muscle healing during the first 2-3 days after your injury. The PRICE method involves:  Protecting the muscle from being injured again.  Restricting your activity and resting the injured  body part.  Icing your injury. To do this, put ice in a plastic bag. Place a towel between your skin and the bag. Then, apply the ice and leave it on from 15-20 minutes each hour. After the third day, switch to moist heat packs.  Apply compression to the injured area with a splint or elastic bandage. Be careful not to wrap it too tightly. This may interfere with blood circulation or increase swelling.  Elevate the injured body part above the level of your heart as often as you  can.  Only take over-the-counter or prescription medicines for pain, discomfort, or fever as directed by your health care provider.  Warming up prior to exercise helps to prevent future muscle strains. SEEK MEDICAL CARE IF:   You have increasing pain or swelling in the injured area.  You have numbness, tingling, or a significant loss of strength in the injured area. MAKE SURE YOU:   Understand these instructions.  Will watch your condition.  Will get help right away if you are not doing well or get worse. Document Released: 03/01/2005 Document Revised: 12/20/2012 Document Reviewed: 09/28/2012 [facility] Patient Information 2015 [place], [place]. This information is not intended to replace advice given to you by your health care provider. Make sure you discuss any questions you have with your health care provider.

## 2014-06-10 ENCOUNTER — Encounter: Payer: Self-pay | Admitting: *Deleted

## 2014-06-12 ENCOUNTER — Ambulatory Visit (INDEPENDENT_AMBULATORY_CARE_PROVIDER_SITE_OTHER): Payer: Self-pay | Admitting: Infectious Disease

## 2014-06-12 ENCOUNTER — Encounter: Payer: Self-pay | Admitting: Infectious Disease

## 2014-06-12 VITALS — BP 107/77 | HR 69 | Temp 98.5°F | Wt 191.0 lb

## 2014-06-12 DIAGNOSIS — B20 Human immunodeficiency virus [HIV] disease: Secondary | ICD-10-CM

## 2014-06-12 DIAGNOSIS — Z23 Encounter for immunization: Secondary | ICD-10-CM

## 2014-06-12 DIAGNOSIS — Z5900 Homelessness unspecified: Secondary | ICD-10-CM

## 2014-06-12 DIAGNOSIS — F331 Major depressive disorder, recurrent, moderate: Secondary | ICD-10-CM

## 2014-06-12 DIAGNOSIS — F333 Major depressive disorder, recurrent, severe with psychotic symptoms: Secondary | ICD-10-CM

## 2014-06-12 DIAGNOSIS — F10220 Alcohol dependence with intoxication, uncomplicated: Secondary | ICD-10-CM

## 2014-06-12 DIAGNOSIS — F1420 Cocaine dependence, uncomplicated: Secondary | ICD-10-CM

## 2014-06-12 DIAGNOSIS — F319 Bipolar disorder, unspecified: Secondary | ICD-10-CM

## 2014-06-12 NOTE — Progress Notes (Signed)
Subjective:    Patient ID: John [name], male    DOB: [birth year], 44 y.o.   MRN: [id number]  HPI   44 year old with HIV, substance abuse (daily alcohol,cocaine and marijuana use)  homelessness who is actually a [facility] patient who had originally been in the [facility]. He is followed by Dr. Baxter Flattery who has started pt on STRIBILD and Prezista via [facility] and now back via [facility] and [facility] at no cost.  His most recent viral load is undetectable CD4 count is healthy.  He continues to be homeless. His aunts does continue to check on him daily at 8 AM. She makes her that he gets some food and that he is taking his antiretrovirals.  He is also taking bronchodilators for his asthma. He tells me also taking Qvar finally he is taking Seroquel.   Review of Systems  Constitutional: Negative for fever, chills, diaphoresis, activity change, appetite change, fatigue and unexpected weight change.  HENT: Negative for congestion, rhinorrhea, sinus pressure, sneezing, sore throat and trouble swallowing.   Eyes: Negative for photophobia and visual disturbance.  Respiratory: Negative for chest tightness, shortness of breath, wheezing and stridor.   Cardiovascular: Negative for chest pain, palpitations and leg swelling.  Gastrointestinal: Negative for nausea, vomiting, abdominal pain, diarrhea, constipation, blood in stool, abdominal distention and anal bleeding.  Genitourinary: Negative for dysuria, hematuria, flank pain and difficulty urinating.  Musculoskeletal: Negative for myalgias, back pain, arthralgias and gait problem.  Skin: Negative for color change, pallor, rash and wound.  Neurological: Positive for headaches. Negative for dizziness, tremors, weakness and light-headedness.  Hematological: Negative for adenopathy. Does not bruise/bleed easily.  Psychiatric/Behavioral: Positive for dysphoric mood and decreased concentration. Negative for confusion, sleep disturbance and agitation.        Objective:   Physical Exam  Constitutional: He is oriented to person, place, and time. He appears well-developed and well-nourished.  HENT:  Head: Normocephalic and atraumatic.  Eyes: Conjunctivae and EOM are normal.  Neck: Normal range of motion. Neck supple.  Cardiovascular: Normal rate, regular rhythm and normal heart sounds.  Exam reveals no gallop and no friction rub.   No murmur heard. Pulmonary/Chest: Effort normal. No respiratory distress. He has wheezes.  Abdominal: Soft. He exhibits no distension.  Musculoskeletal: Normal range of motion. He exhibits no edema.       Right hand: He exhibits tenderness.  Neurological: He is alert and oriented to person, place, and time.  Skin: Skin is warm and dry. No rash noted. No erythema. No pallor.  Psychiatric: His speech is delayed. He is slowed and withdrawn.  Rocking back and forth throughout the interview. He and his aunt say that he's had this as a nervous habit for many years          Assessment & Plan:   HIV disease: problems in past with compliance due to his chaotic lifestyle. Continue Prezista with STRIBILD He needs housing and will see if [facility] can [facility] him  I spent greater than 25  minutes with the patient including greater than 50% of time in face to face counsel of the patient and in coordination of their care. Discussed how he takes his medications his homelessness, needs for housing. Problems with drug use.  Asthma states he is taking Qvar along with l continue this along with inhaled beta agonist therapy as needed. He has some audible extra wheezes on exam today encouraged him to use decrease use of beta agonist today.  Homelessnees:  As above we'll see if Amber can help him  with housing  Alcoholism, at times daily cocaine use marijuana use: None states he is drinking a proximal once  week but this varies based on who he interacts with   Depression Major recurrent and chronic potentially with bipolar  also with times having psychotic features continued on  Depakote and seroquel     Itching better

## 2014-06-20 ENCOUNTER — Emergency Department (HOSPITAL_COMMUNITY): Payer: Federal, State, Local not specified - Other

## 2014-06-20 ENCOUNTER — Emergency Department (HOSPITAL_COMMUNITY)
Admission: EM | Admit: 2014-06-20 | Discharge: 2014-06-21 | Disposition: A | Discharge status: Home / Self Care | Payer: Self-pay | Attending: Emergency Medicine | Admitting: Emergency Medicine

## 2014-06-20 ENCOUNTER — Encounter (HOSPITAL_COMMUNITY): Payer: Self-pay | Admitting: Nurse Practitioner

## 2014-06-20 DIAGNOSIS — Z8739 Personal history of other diseases of the musculoskeletal system and connective tissue: Secondary | ICD-10-CM | POA: Insufficient documentation

## 2014-06-20 DIAGNOSIS — Z21 Asymptomatic human immunodeficiency virus [HIV] infection status: Secondary | ICD-10-CM | POA: Insufficient documentation

## 2014-06-20 DIAGNOSIS — F1020 Alcohol dependence, uncomplicated: Secondary | ICD-10-CM | POA: Insufficient documentation

## 2014-06-20 DIAGNOSIS — F329 Major depressive disorder, single episode, unspecified: Secondary | ICD-10-CM | POA: Insufficient documentation

## 2014-06-20 DIAGNOSIS — R000 Tachycardia, unspecified: Secondary | ICD-10-CM | POA: Insufficient documentation

## 2014-06-20 DIAGNOSIS — E872 Acidosis: Secondary | ICD-10-CM | POA: Insufficient documentation

## 2014-06-20 DIAGNOSIS — J45901 Unspecified asthma with (acute) exacerbation: Secondary | ICD-10-CM

## 2014-06-20 DIAGNOSIS — Z590 Homelessness: Secondary | ICD-10-CM | POA: Insufficient documentation

## 2014-06-20 DIAGNOSIS — Z720 Tobacco use: Secondary | ICD-10-CM | POA: Insufficient documentation

## 2014-06-20 DIAGNOSIS — I10 Essential (primary) hypertension: Secondary | ICD-10-CM | POA: Insufficient documentation

## 2014-06-20 DIAGNOSIS — Z79899 Other long term (current) drug therapy: Secondary | ICD-10-CM | POA: Insufficient documentation

## 2014-06-20 DIAGNOSIS — F419 Anxiety disorder, unspecified: Secondary | ICD-10-CM | POA: Insufficient documentation

## 2014-06-20 DIAGNOSIS — G8929 Other chronic pain: Secondary | ICD-10-CM | POA: Insufficient documentation

## 2014-06-20 DIAGNOSIS — R0600 Dyspnea, unspecified: Secondary | ICD-10-CM

## 2014-06-20 DIAGNOSIS — E8729 Other acidosis: Secondary | ICD-10-CM

## 2014-06-20 LAB — BASIC METABOLIC PANEL
Anion gap: 25 — ABNORMAL HIGH (ref 5–15)
BUN: 21 mg/dL (ref 6–23)
CO2: 14 mmol/L — ABNORMAL LOW (ref 19–32)
Calcium: 9.2 mg/dL (ref 8.4–10.5)
Chloride: 99 mmol/L (ref 96–112)
Creatinine, Ser: 1.16 mg/dL (ref 0.50–1.35)
GFR calc Af Amer: 88 mL/min — ABNORMAL LOW (ref >90)
GFR calc non Af Amer: 76 mL/min — ABNORMAL LOW (ref >90)
Glucose, Bld: 62 mg/dL — ABNORMAL LOW (ref 70–99)
Potassium: 4.1 mmol/L (ref 3.5–5.1)
Sodium: 138 mmol/L (ref 135–145)

## 2014-06-20 LAB — CBC WITH DIFFERENTIAL/PLATELET
Basophils Absolute: 0 10*3/uL (ref 0.0–0.1)
Basophils Relative: 0 % (ref 0–1)
Eosinophils Absolute: 0 10*3/uL (ref 0.0–0.7)
Eosinophils Relative: 0 % (ref 0–5)
HCT: 41 % (ref 39.0–52.0)
Hemoglobin: 13.6 g/dL (ref 13.0–17.0)
Lymphocytes Relative: 17 % (ref 12–46)
Lymphs Abs: 1.8 10*3/uL (ref 0.7–4.0)
MCH: 33.2 pg (ref 26.0–34.0)
MCHC: 33.2 g/dL (ref 30.0–36.0)
MCV: 100 fL (ref 78.0–100.0)
Monocytes Absolute: 0.1 10*3/uL (ref 0.1–1.0)
Monocytes Relative: 1 % — ABNORMAL LOW (ref 3–12)
Neutro Abs: 8.7 10*3/uL — ABNORMAL HIGH (ref 1.7–7.7)
Neutrophils Relative %: 82 % — ABNORMAL HIGH (ref 43–77)
Platelets: 358 10*3/uL (ref 150–400)
RBC: 4.1 MIL/uL — ABNORMAL LOW (ref 4.22–5.81)
RDW: 14 % (ref 11.5–15.5)
WBC: 10.7 10*3/uL — ABNORMAL HIGH (ref 4.0–10.5)

## 2014-06-20 MED ORDER — ADULT MULTIVITAMIN W/MINERALS CH
1.0000 | ORAL_TABLET | Freq: Once | ORAL | Status: AC
  Administered 2014-06-21: 1 via ORAL
  Filled 2014-06-20: qty 1

## 2014-06-20 MED ORDER — LORAZEPAM 2 MG/ML IJ SOLN
1.0000 mg | Freq: Once | INTRAMUSCULAR | Status: AC
  Administered 2014-06-20: 1 mg via INTRAVENOUS
  Filled 2014-06-20: qty 1

## 2014-06-20 MED ORDER — THIAMINE HCL 100 MG/ML IJ SOLN
100.0000 mg | Freq: Once | INTRAMUSCULAR | Status: AC
  Administered 2014-06-21: 100 mg via INTRAVENOUS
  Filled 2014-06-20: qty 2

## 2014-06-20 MED ORDER — SODIUM CHLORIDE 0.9 % IV BOLUS (SEPSIS)
1000.0000 mL | Freq: Once | INTRAVENOUS | Status: AC
  Administered 2014-06-20: 1000 mL via INTRAVENOUS

## 2014-06-20 MED ORDER — ALBUTEROL (5 MG/ML) CONTINUOUS INHALATION SOLN
15.0000 mg/h | INHALATION_SOLUTION | RESPIRATORY_TRACT | Status: DC
  Administered 2014-06-20: 15 mg/h via RESPIRATORY_TRACT
  Filled 2014-06-20: qty 20

## 2014-06-20 MED ORDER — SODIUM CHLORIDE 0.9 % IV BOLUS (SEPSIS)
1000.0000 mL | Freq: Once | INTRAVENOUS | Status: AC
  Administered 2014-06-21: 1000 mL via INTRAVENOUS

## 2014-06-20 MED ORDER — METHYLPREDNISOLONE SODIUM SUCC 125 MG IJ SOLR
125.0000 mg | Freq: Once | INTRAMUSCULAR | Status: AC
  Administered 2014-06-21: 125 mg via INTRAVENOUS
  Filled 2014-06-20: qty 2

## 2014-06-20 NOTE — ED Notes (Signed)
Patient refused to let me collect labs I made MD aware.

## 2014-06-20 NOTE — ED Notes (Signed)
Bed: WA02 Expected date:  Expected time:  Means of arrival:  Comments: EMS- SOB, Hx of Asthma

## 2014-06-20 NOTE — ED Notes (Signed)
Pt presents via EMS, on going a breathing treatment started by medics, 5 mg Albuterol and 0.5mg  Atrovent, also received 125 mg Solumedrol IV En route. Denies pain, only c/o nausea, endorses a fifth Vodka to celebrate his birthday, received 4 mg Zofran. Initial assessment reveals expiratory wheezing and diminished lung sound, air movement auscultated in all lobes of the lungs.

## 2014-06-20 NOTE — ED Provider Notes (Signed)
CSN: [id number]     Arrival date & time 06/20/14  1925 History   First MD Initiated Contact with Patient 06/20/14 1937     Chief Complaint  Patient presents with  . Asthma Exerberation       (Consider location/radiation/quality/duration/timing/severity/associated sxs/prior Treatment) HPI   44 year old male with shortness of breath. Patient has a past history of asthma and feels these have an exacerbation. Breathing worsening throughout this afternoon. She received albuterol, Atrovent and Solu-Medrol by EMS. No interventions and self prior to arrival. Today's patient's birthday and admits to drinking heavily. He denies any significant pain anywhere.  Past Medical History  Diagnosis Date  . Asthma   . HIV (human immunodeficiency virus infection)     "dx'd ~ 2 yr ago" (09/29/2012)  . Mental disorder   . Anxiety   . Depression   . Chronic low back pain   . Alcohol abuse   . HIV disease   . Hypertension   . Asthma   . Gout   . Alcoholism   . Homelessness   . Cocaine abuse   . Marijuana abuse    Past Surgical History  Procedure Laterality Date  . Skin graft full thickness leg Left ?    POSTERIOR LEFT LEG  AFTER DOG BITES   Family History  Problem Relation Age of Onset  . Alcoholism Mother   . Alcoholism Brother    History  Substance Use Topics  . Smoking status: Current Every Day Smoker -- 0.50 packs/day for 27 years    Types: Cigarettes  . Smokeless tobacco: Never Used     Comment: cutting back  . Alcohol Use: 29.4 oz/week    49 Cans of beer per week     Comment: social    Review of Systems  All systems reviewed and negative, other than as noted in HPI.   Allergies  Shellfish allergy  Home Medications   Prior to Admission medications   Medication Sig Start Date End Date Taking? Authorizing Provider  albuterol (PROVENTIL HFA;VENTOLIN HFA) 108 (90 BASE) MCG/ACT inhaler Inhale 2 puffs into the lungs every 4 (four) hours as needed for wheezing or shortness of  breath. 04/26/14  Yes Encarnacion Slates, NP  Darunavir Ethanolate (PREZISTA) 800 MG tablet Take 1 tablet (800 mg total) by mouth daily with breakfast. For HIV infection 04/26/14  Yes Encarnacion Slates, NP  divalproex (DEPAKOTE ER) 500 MG 24 hr tablet Take 1 tablet (500 mg) in AM daily & 2 tablets (1,000 mg) at bedtime: For mood stabilization Patient taking differently: Take 500 mg by mouth 2 (two) times daily. Take 1 tablet (500 mg) in AM daily & Take 1 tablet (500 mg) at bedtime: For mood stabilization 04/26/14  Yes Encarnacion Slates, NP  elvitegravir-cobicistat-emtricitabine-tenofovir (STRIBILD) 150-150-200-300 MG TABS tablet Take 1 tablet by mouth daily with breakfast. For HIV infection 04/26/14  Yes Encarnacion Slates, NP  HYDROcodone-acetaminophen (NORCO/VICODIN) 5-325 MG per tablet Take 1 tablet by mouth every 6 (six) hours as needed for moderate pain or severe pain (pain).   Yes Historical Provider, MD  QUEtiapine (SEROQUEL) 300 MG tablet Take 1 tablet (300 mg total) by mouth at bedtime. For mood control 04/26/14  Yes Encarnacion Slates, NP  QUEtiapine (SEROQUEL) 50 MG tablet Take 1 tablet (50 mg total) by mouth daily. For agitation/mood control Patient taking differently: Take 100 mg by mouth every morning. For agitation/mood control 04/26/14  Yes Encarnacion Slates, NP  traZODone (DESYREL) 50 MG tablet Take  1 tablet (50 mg) at bedtime for sleep Patient taking differently: Take 50 mg by mouth 2 (two) times daily. Take 1 tablet (50 mg) bid. 04/26/14  Yes Encarnacion Slates, NP  gabapentin (NEURONTIN) 100 MG capsule Take 2 capsules (200 mg total) by mouth 3 (three) times daily. For agitation/substance withdrawal syndrome Patient not taking: Reported on 06/20/2014 04/26/14   Encarnacion Slates, NP  mometasone-formoterol (DULERA) 100-5 MCG/ACT AERO Inhale 2 puffs into the lungs 2 (two) times daily. For shortness of breath/Asthma Patient not taking: Reported on 06/20/2014 04/26/14   Encarnacion Slates, NP   BP 109/53 mmHg  Pulse 115  Temp(Src) 98.1  F (36.7 C) (Rectal)  Resp 112  SpO2 94% Physical Exam  Constitutional: He appears well-developed and well-nourished. No distress.  HENT:  Head: Normocephalic and atraumatic.  Eyes: Conjunctivae are normal. Right eye exhibits no discharge. Left eye exhibits no discharge.  Neck: Neck supple.  Cardiovascular: Regular rhythm and normal heart sounds.  Exam reveals no gallop and no friction rub.   No murmur heard. Mild tachycardia  Pulmonary/Chest: Effort normal. No respiratory distress. He has wheezes.  Abdominal: Soft. He exhibits no distension. There is no tenderness.  Musculoskeletal: He exhibits no edema or tenderness.  Neurological: He is alert.  Drowsy consistent with alcohol intoxication. He opens his eyes to voice though and follows commands. His answers are appropriate.  Skin: Skin is warm and dry.  Psychiatric: He has a normal mood and affect. His behavior is normal. Thought content normal.  Nursing note and vitals reviewed.   ED Course  Procedures (including critical care time) Labs Review Labs Reviewed  CBC WITH DIFFERENTIAL/PLATELET - Abnormal; Notable for the following:    WBC 10.7 (*)    RBC 4.10 (*)    Neutrophils Relative % 82 (*)    Neutro Abs 8.7 (*)    Monocytes Relative 1 (*)    All other components within normal limits  BASIC METABOLIC PANEL - Abnormal; Notable for the following:    CO2 14 (*)    Glucose, Bld 62 (*)    GFR calc non Af Amer 76 (*)    GFR calc Af Amer 88 (*)    Anion gap 25 (*)    All other components within normal limits  URINALYSIS, ROUTINE W REFLEX MICROSCOPIC - Abnormal; Notable for the following:    Hgb urine dipstick SMALL (*)    Ketones, ur 15 (*)    Protein, ur 30 (*)    All other components within normal limits  URINE MICROSCOPIC-ADD ON - Abnormal; Notable for the following:    Bacteria, UA FEW (*)    Casts HYALINE CASTS (*)    All other components within normal limits  CULTURE, BLOOD (ROUTINE X 2)  CULTURE, BLOOD (ROUTINE X  2)  LACTIC ACID, PLASMA  LACTIC ACID, PLASMA  HEPATIC FUNCTION PANEL  SALICYLATE LEVEL    Imaging Review Dg Chest 2 View  06/20/2014   CLINICAL DATA:  Dyspnea and productive cough for 2 days.  Smoker.  EXAM: CHEST  2 VIEW  COMPARISON:  12/19/2013  FINDINGS: Shallow inspiration. Probable emphysematous changes in the upper lungs. The heart size and mediastinal contours are within normal limits. Both lungs are clear. The visualized skeletal structures are unremarkable.  IMPRESSION: No active cardiopulmonary disease.   Electronically Signed   By: Lucienne Capers M.D.   On: 06/20/2014 22:51     EKG Interpretation None      MDM   Final  diagnoses:  Dyspnea  Asthma exacerbation  Alcoholic ketoacidosis    84MO with dyspnea. Clinically asthma exacerbation. W/u significant for anion gap metabolic acidosis. Suspect alcoholic ketoacidosis. Hx of alcohol abuse. Admits to binge drinking, worse in last week. Poor PO intake otherwise. Mild hypoglycemia.  No obvious infection. Afebrile rectally. Minimal leukocytosis, but this is non specific. Tachycardic, but did receive hour long albuterol neb.  No acute pain complaints. Pt has been drinking, but answering appropriately and I feel he is actually pretty reliable historian. Denies ingestion aside from ETOH. Will check salicylate level though. Check lactic acid.   Pt refusing additional work up. He would like to leave. Unfortunately he spent most of his birthday in the ER and wants to go now. I still suspect that this is alcoholic ketoacidosis, but would like additional work up. He has medical decision making capability at this time.  Understands benefits of further testing and risks of potentially missing more serious diagnosis or delaying care.  He is agreeable to additional IVF and meds though. Will discharge once this is completed. Will additionally send home with albuterol MDI and prescription for a couple more days of continued steroids.    Virgel Manifold, MD 06/26/14 [phone]

## 2014-06-21 LAB — URINE MICROSCOPIC-ADD ON

## 2014-06-21 LAB — URINALYSIS, ROUTINE W REFLEX MICROSCOPIC
Bilirubin Urine: NEGATIVE
Glucose, UA: NEGATIVE mg/dL
Ketones, ur: 15 mg/dL — AB
Leukocytes, UA: NEGATIVE
Nitrite: NEGATIVE
Protein, ur: 30 mg/dL — AB
Specific Gravity, Urine: 1.022 (ref 1.005–1.030)
Urobilinogen, UA: 0.2 mg/dL (ref 0.0–1.0)
pH: 5 (ref 5.0–8.0)

## 2014-06-21 MED ORDER — PREDNISONE 20 MG PO TABS: 40.0000 mg | ORAL_TABLET | Freq: Every day | ORAL | Status: DC

## 2014-06-21 MED ORDER — DEXTROSE 50 % IV SOLN
50.0000 mL | Freq: Once | INTRAVENOUS | Status: AC
  Administered 2014-06-21: 50 mL via INTRAVENOUS
  Filled 2014-06-21: qty 50

## 2014-06-21 MED ORDER — ALBUTEROL SULFATE HFA 108 (90 BASE) MCG/ACT IN AERS
2.0000 | INHALATION_SPRAY | Freq: Once | RESPIRATORY_TRACT | Status: AC
  Administered 2014-06-21: 2 via RESPIRATORY_TRACT
  Filled 2014-06-21: qty 6.7

## 2014-06-21 NOTE — ED Notes (Signed)
Pt refused all blood work EDP notified

## 2014-06-21 NOTE — Discharge Instructions (Signed)

## 2014-06-27 LAB — CULTURE, BLOOD (ROUTINE X 2): Culture: NO GROWTH

## 2014-07-16 IMAGING — CR DG CHEST 2V
2 series · 2 of 2 positions shown · non-contrast
Comparison: 02/21/2013 and earlier.

CLINICAL DATA: 42-year-old male with shortness of Breath. Initial
encounter.

EXAM:
CHEST  2 VIEW

[w chest lat]
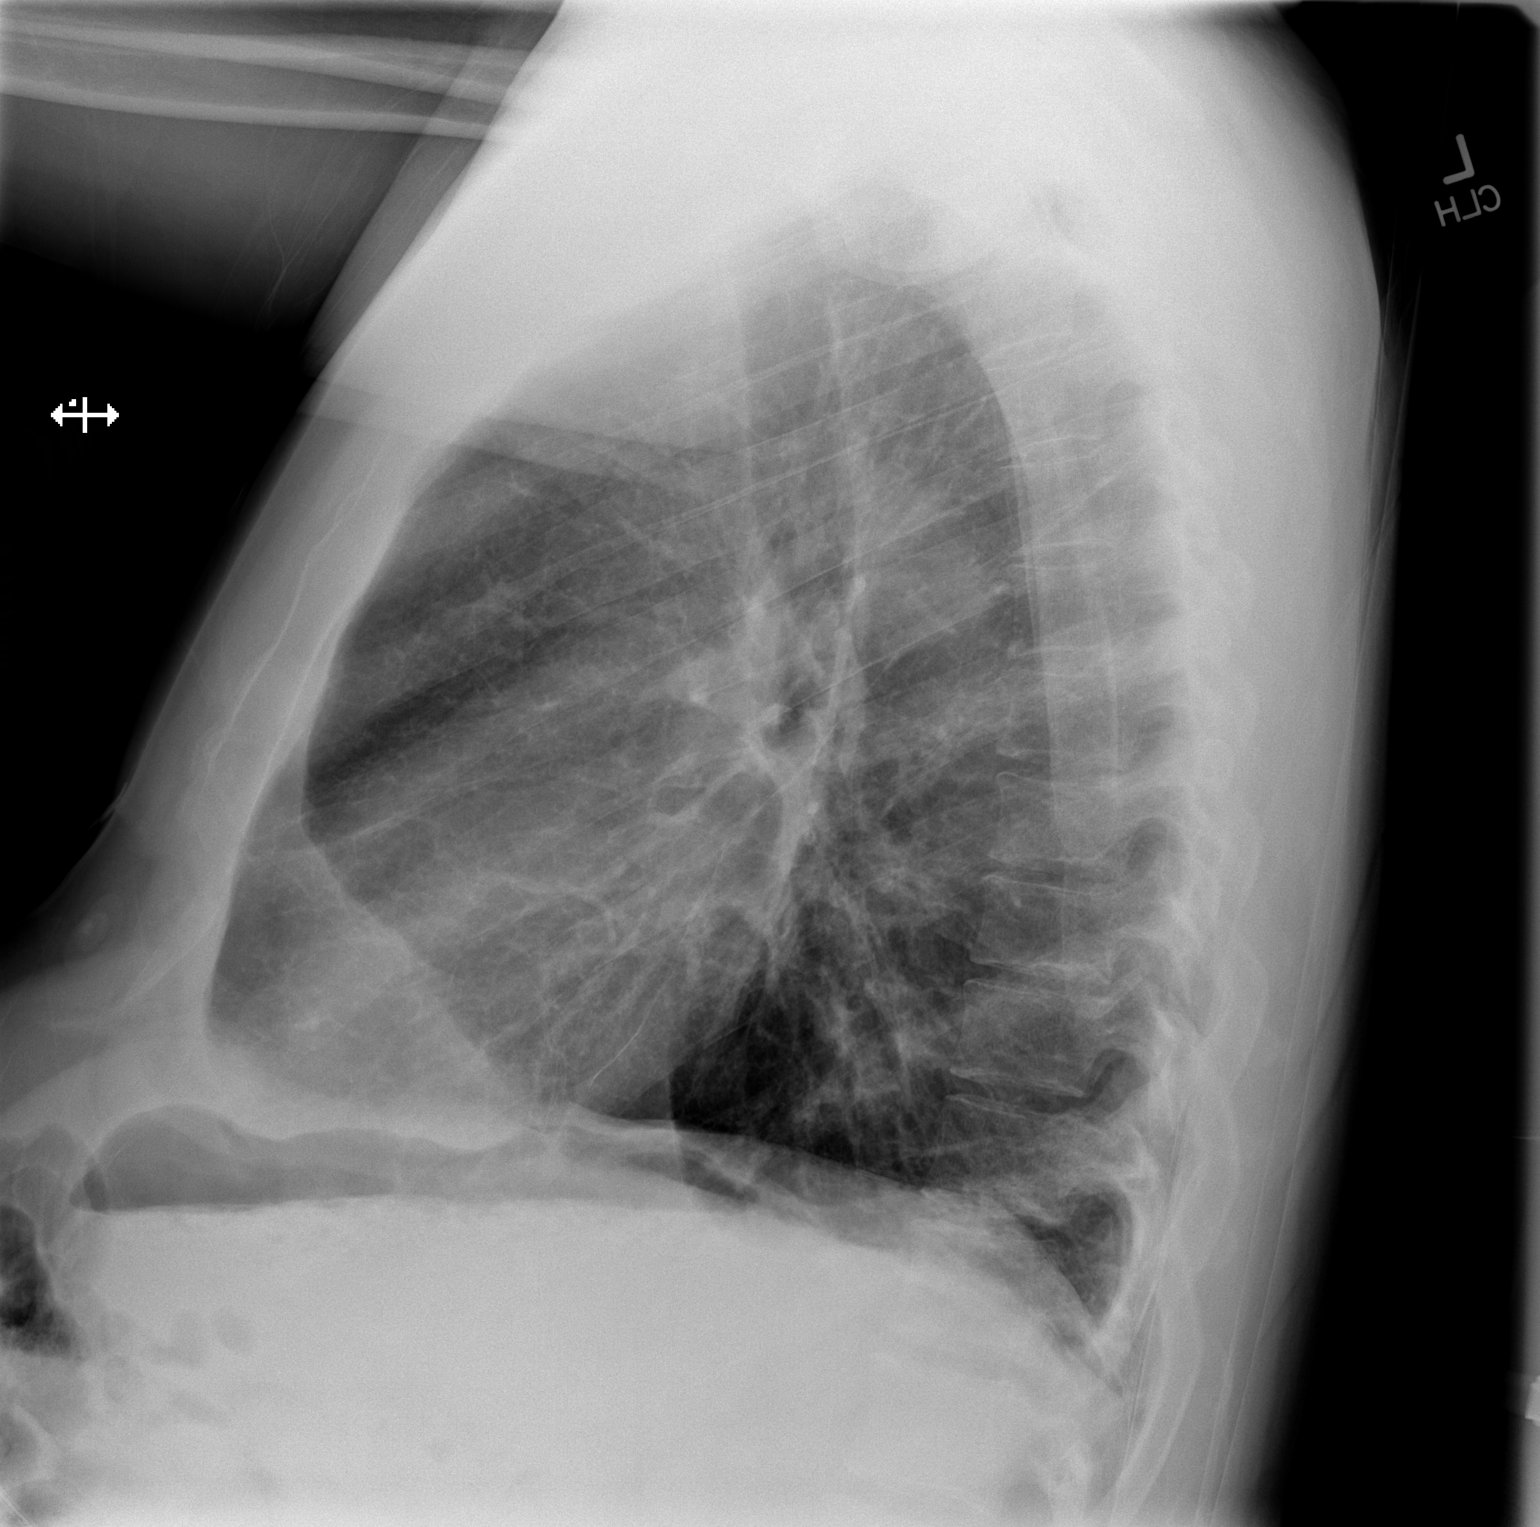

[x chest ap]
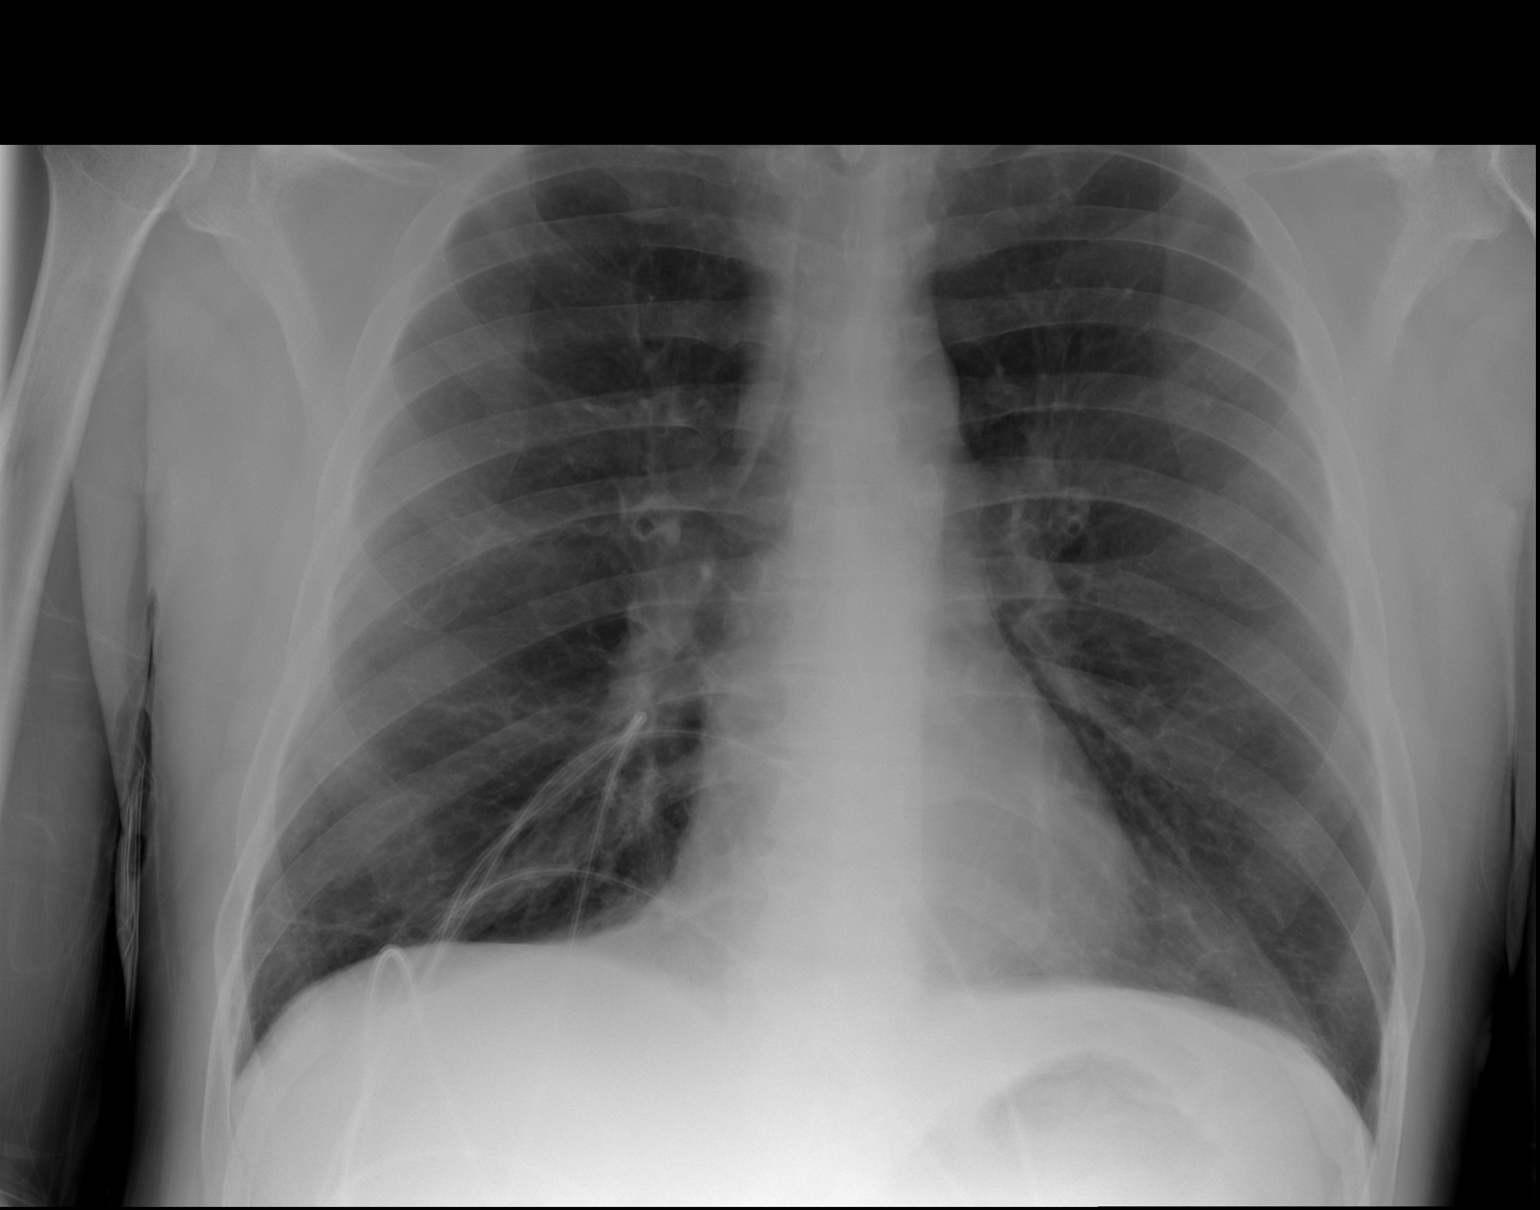

[2 of 2 positions shown; findings below may reference images not displayed]

FINDINGS: Stable lung volumes. Normal cardiac size and mediastinal contours.
Visualized tracheal air column is within normal limits. No
pneumothorax. No pleural effusion or pulmonary edema. Stable mild
increased interstitial markings, including at the mid right lung. No
acute pulmonary opacity. No acute osseous abnormality identified.
IMPRESSION: No acute cardiopulmonary abnormality.

## 2014-08-20 MED ORDER — HYDROCHLOROTHIAZIDE 25 MG PO TABS
ORAL_TABLET | ORAL | Status: AC
  Filled 2014-08-20: qty 1

## 2014-08-20 MED ORDER — SPIRONOLACTONE 25 MG PO TABS
ORAL_TABLET | ORAL | Status: AC
  Filled 2014-08-20: qty 1

## 2014-08-20 MED ORDER — METFORMIN HCL 850 MG PO TABS
ORAL_TABLET | ORAL | Status: AC
  Filled 2014-08-20: qty 1

## 2014-08-23 ENCOUNTER — Encounter (INDEPENDENT_AMBULATORY_CARE_PROVIDER_SITE_OTHER): Payer: Self-pay | Admitting: *Deleted

## 2014-08-23 VITALS — BP 119/82 | HR 75 | Temp 99.0°F | Resp 16 | Wt 180.5 lb

## 2014-08-23 DIAGNOSIS — Z006 Encounter for examination for normal comparison and control in clinical research program: Secondary | ICD-10-CM

## 2014-08-23 LAB — COMPREHENSIVE METABOLIC PANEL
ALBUMIN: 4.3 g/dL (ref 3.5–5.2)
ALK PHOS: 114 U/L (ref 39–117)
ALT: 32 U/L (ref 0–53)
AST: 31 U/L (ref 0–37)
BILIRUBIN TOTAL: 0.4 mg/dL (ref 0.2–1.2)
BUN: 13 mg/dL (ref 6–23)
CALCIUM: 9.6 mg/dL (ref 8.4–10.5)
CHLORIDE: 102 meq/L (ref 96–112)
CO2: 23 meq/L (ref 19–32)
CREATININE: 1.03 mg/dL (ref 0.50–1.35)
Glucose, Bld: 94 mg/dL (ref 70–99)
POTASSIUM: 4.4 meq/L (ref 3.5–5.3)
SODIUM: 141 meq/L (ref 135–145)
Total Protein: 7 g/dL (ref 6.0–8.3)

## 2014-08-23 LAB — LIPID PANEL
CHOLESTEROL: 162 mg/dL (ref 0–200)
HDL: 66 mg/dL (ref >=40)
LDL Cholesterol: 75 mg/dL (ref 0–99)
Total CHOL/HDL Ratio: 2.5 Ratio
Triglycerides: 106 mg/dL (ref <150)
VLDL: 21 mg/dL (ref 0–40)

## 2014-08-23 LAB — POCT URINALYSIS DIPSTICK
BILIRUBIN UA: NEGATIVE
Glucose, UA: NEGATIVE
Ketones, UA: NEGATIVE
Leukocytes, UA: NEGATIVE
NITRITE UA: NEGATIVE
Protein, UA: NEGATIVE
Spec Grav, UA: 1.025
Urobilinogen, UA: 0.2
pH, UA: 5

## 2014-08-23 NOTE — Progress Notes (Signed)
John [name] is here with his aunt for his month 36 [facility] study visit. She has been making sure he gets his medication everyday. He is homeless and she meets him at the [facility] every morning and he stays with her on the weekends. They talked with Mitch today about possible resources for [facility] and getting him back in touch with a [facility] case manager. He continues to have bouts of depression  and hallucinations. Anxious appearing, rocks back and forth constantly. He denies any problems from his HIv meds and will return in 6 months for the next study visit. He has an appt with Dr. Tommy Medal next month.

## 2014-08-26 LAB — HIV-1 RNA QUANT-NO REFLEX-BLD
HIV 1 RNA Quant: <20 {copies}/mL
HIV-1 RNA Quant, Log: <1.3 {Log}

## 2014-09-06 ENCOUNTER — Encounter: Payer: Self-pay | Admitting: Infectious Disease

## 2014-09-06 LAB — CD4/CD8 (T-HELPER/T-SUPPRESSOR CELL)
CD4%: 27.1
CD4: 515
CD8 % Suppressor T Cell: 55.6
CD8: 1056

## 2014-09-18 ENCOUNTER — Ambulatory Visit: Payer: Self-pay | Admitting: Infectious Disease

## 2014-11-13 ENCOUNTER — Emergency Department (HOSPITAL_COMMUNITY): Payer: No Typology Code available for payment source

## 2014-11-13 ENCOUNTER — Emergency Department (HOSPITAL_COMMUNITY)
Admission: EM | Admit: 2014-11-13 | Discharge: 2014-11-14 | Disposition: A | Discharge status: Other Acute Inpatient Hospital | Payer: No Typology Code available for payment source | Attending: Emergency Medicine | Admitting: Emergency Medicine

## 2014-11-13 ENCOUNTER — Encounter (HOSPITAL_COMMUNITY): Payer: Self-pay | Admitting: Neurology

## 2014-11-13 DIAGNOSIS — Y998 Other external cause status: Secondary | ICD-10-CM | POA: Insufficient documentation

## 2014-11-13 DIAGNOSIS — S41132A Puncture wound without foreign body of left upper arm, initial encounter: Secondary | ICD-10-CM | POA: Insufficient documentation

## 2014-11-13 DIAGNOSIS — Z720 Tobacco use: Secondary | ICD-10-CM | POA: Diagnosis not present

## 2014-11-13 DIAGNOSIS — S21212A Laceration without foreign body of left back wall of thorax without penetration into thoracic cavity, initial encounter: Secondary | ICD-10-CM

## 2014-11-13 DIAGNOSIS — Y9389 Activity, other specified: Secondary | ICD-10-CM | POA: Insufficient documentation

## 2014-11-13 DIAGNOSIS — J45909 Unspecified asthma, uncomplicated: Secondary | ICD-10-CM | POA: Diagnosis not present

## 2014-11-13 DIAGNOSIS — R45851 Suicidal ideations: Secondary | ICD-10-CM

## 2014-11-13 DIAGNOSIS — Y9289 Other specified places as the place of occurrence of the external cause: Secondary | ICD-10-CM | POA: Insufficient documentation

## 2014-11-13 DIAGNOSIS — F319 Bipolar disorder, unspecified: Secondary | ICD-10-CM | POA: Insufficient documentation

## 2014-11-13 DIAGNOSIS — Z79899 Other long term (current) drug therapy: Secondary | ICD-10-CM | POA: Diagnosis not present

## 2014-11-13 DIAGNOSIS — S4992XA Unspecified injury of left shoulder and upper arm, initial encounter: Secondary | ICD-10-CM | POA: Diagnosis present

## 2014-11-13 DIAGNOSIS — B20 Human immunodeficiency virus [HIV] disease: Secondary | ICD-10-CM | POA: Insufficient documentation

## 2014-11-13 DIAGNOSIS — S21232A Puncture wound without foreign body of left back wall of thorax without penetration into thoracic cavity, initial encounter: Secondary | ICD-10-CM | POA: Diagnosis not present

## 2014-11-13 DIAGNOSIS — S41112A Laceration without foreign body of left upper arm, initial encounter: Secondary | ICD-10-CM

## 2014-11-13 HISTORY — DX: Schizophrenia, unspecified: F20.9

## 2014-11-13 HISTORY — DX: Bipolar disorder, unspecified: F31.9

## 2014-11-13 LAB — COMPREHENSIVE METABOLIC PANEL
ALBUMIN: 4.1 g/dL (ref 3.5–5.0)
ALT: 21 U/L (ref 17–63)
AST: 35 U/L (ref 15–41)
Alkaline Phosphatase: 93 U/L (ref 38–126)
Anion gap: 14 (ref 5–15)
BUN: 9 mg/dL (ref 6–20)
CHLORIDE: 110 mmol/L (ref 101–111)
CO2: 17 mmol/L — AB (ref 22–32)
CREATININE: 1.16 mg/dL (ref 0.61–1.24)
Calcium: 9.1 mg/dL (ref 8.9–10.3)
GFR calc non Af Amer: >60 mL/min (ref >60)
GLUCOSE: 116 mg/dL — AB (ref 65–99)
Potassium: 3.9 mmol/L (ref 3.5–5.1)
SODIUM: 141 mmol/L (ref 135–145)
Total Bilirubin: 0.7 mg/dL (ref 0.3–1.2)
Total Protein: 7.1 g/dL (ref 6.5–8.1)

## 2014-11-13 LAB — RAPID URINE DRUG SCREEN, HOSP PERFORMED
Amphetamines: NOT DETECTED
BENZODIAZEPINES: NOT DETECTED
Barbiturates: NOT DETECTED
COCAINE: NOT DETECTED
Opiates: NOT DETECTED
Tetrahydrocannabinol: POSITIVE — AB

## 2014-11-13 LAB — PREPARE FRESH FROZEN PLASMA
UNIT DIVISION: 0
Unit division: 0

## 2014-11-13 LAB — CBC
HCT: 41.6 % (ref 39.0–52.0)
Hemoglobin: 14.3 g/dL (ref 13.0–17.0)
MCH: 34 pg (ref 26.0–34.0)
MCHC: 34.4 g/dL (ref 30.0–36.0)
MCV: 98.8 fL (ref 78.0–100.0)
PLATELETS: 331 10*3/uL (ref 150–400)
RBC: 4.21 MIL/uL — AB (ref 4.22–5.81)
RDW: 13.2 % (ref 11.5–15.5)
WBC: 5.8 10*3/uL (ref 4.0–10.5)

## 2014-11-13 LAB — SAMPLE TO BLOOD BANK

## 2014-11-13 LAB — ETHANOL: Alcohol, Ethyl (B): <5 mg/dL (ref <5)

## 2014-11-13 LAB — PROTIME-INR
INR: 0.97 (ref 0.00–1.49)
Prothrombin Time: 13.1 seconds (ref 11.6–15.2)

## 2014-11-13 LAB — CDS SEROLOGY

## 2014-11-13 MED ORDER — IOHEXOL 300 MG/ML  SOLN
100.0000 mL | Freq: Once | INTRAMUSCULAR | Status: AC | PRN
  Administered 2014-11-13: 100 mL via INTRAVENOUS

## 2014-11-13 MED ORDER — HYDROCODONE-ACETAMINOPHEN 5-325 MG PO TABS
2.0000 | ORAL_TABLET | Freq: Once | ORAL | Status: AC
  Administered 2014-11-13: 2 via ORAL
  Filled 2014-11-13: qty 2

## 2014-11-13 MED ORDER — SODIUM CHLORIDE 0.9 % IV SOLN
Freq: Once | INTRAVENOUS | Status: AC
  Administered 2014-11-13: 10:00:00 via INTRAVENOUS

## 2014-11-13 MED ORDER — ZOLPIDEM TARTRATE 5 MG PO TABS
5.0000 mg | ORAL_TABLET | Freq: Every evening | ORAL | Status: DC | PRN
  Administered 2014-11-13: 5 mg via ORAL
  Filled 2014-11-13: qty 1

## 2014-11-13 MED ORDER — ACETAMINOPHEN 325 MG PO TABS
650.0000 mg | ORAL_TABLET | ORAL | Status: DC | PRN
Start: 2014-11-13 — End: 2014-11-14
  Administered 2014-11-14: 650 mg via ORAL
  Filled 2014-11-13: qty 2

## 2014-11-13 MED ORDER — FENTANYL CITRATE (PF) 100 MCG/2ML IJ SOLN
INTRAMUSCULAR | Status: AC
  Filled 2014-11-13: qty 2

## 2014-11-13 MED ORDER — HYDROCODONE-ACETAMINOPHEN 5-325 MG PO TABS
1.0000 | ORAL_TABLET | Freq: Once | ORAL | Status: AC
  Administered 2014-11-13: 1 via ORAL
  Filled 2014-11-13: qty 1

## 2014-11-13 MED ORDER — FENTANYL CITRATE (PF) 100 MCG/2ML IJ SOLN
50.0000 ug | INTRAMUSCULAR | Status: DC | PRN
  Administered 2014-11-13: 50 ug via INTRAVENOUS

## 2014-11-13 NOTE — ED Notes (Signed)
Pt wanded by security and cleared to be move to Pod [facility].
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
## 2014-11-13 NOTE — ED Notes (Signed)
Patient transported to CT 

## 2014-11-13 NOTE — Progress Notes (Signed)
CSW seeking inpatient Psych bed(s) for patient.   Referred to: [place], MSW, [place]

## 2014-11-13 NOTE — Progress Notes (Signed)
Inpatient bed search update:  Under Review:  [facility]- pending review  Rosana Hoes- to review and advise  [facility]-left message  [facility]- await callback  [facility]  Pending:  Declined: [facility]-full  [facility]- full/medical acuity  [place]- full    Eduard Clos, MSW, [place]
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
## 2014-11-13 NOTE — Discharge Instructions (Signed)
If you were given medicines take as directed.  If you are on coumadin or contraceptives realize their levels and effectiveness is altered by many different medicines.  If you have any reaction (rash, tongues swelling, other) to the medicines stop taking and see a physician.    If your blood pressure was elevated in the ER make sure you follow up for management with a primary doctor or return for chest pain, shortness of breath or stroke symptoms.  Please follow up as directed and return to the ER or see a physician for new or worsening symptoms.  Thank you. Filed Vitals:   11/13/14 0848  BP: 110/80  Pulse: 90  Temp: 98.7 F (37.1 C)  TempSrc: Oral  Resp: 18  Height: 5\' 6"  (1.676 m)  Weight: 180 lb (81.647 kg)  SpO2: 100%
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
## 2014-11-13 NOTE — ED Notes (Signed)
Pt sitter is at bedside, pt is eating lunch.

## 2014-11-13 NOTE — ED Notes (Signed)
Patient request medication to sleep MD made aware. No recommendations at this time.
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
## 2014-11-13 NOTE — ED Notes (Addendum)
Pt reports thoughts of SI, with plan to jump off a bridge. Reports the thoughts have been coming and going but today are present. MD made aware.
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
## 2014-11-13 NOTE — BH Assessment (Signed)
Psychiatrist, Dr. Darleene Cleaver recommends inpatient admission. Writer contacted EDP-Dr. Jerilee Field and made him aware of patient's disposition. Writer also contacted patient's nurse and made her aware of patient's disposition.

## 2014-11-13 NOTE — ED Notes (Signed)
Pt reports has been out of his psychiatric meds for 5 months.

## 2014-11-13 NOTE — ED Notes (Signed)
Pt doing telepsych at bedside.

## 2014-11-13 NOTE — ED Notes (Signed)
Pt clothing:  Shoes, socks, shirt, underwear, shorts, and drawstring bag (inhaler and keys) bagged and labeled. At pt bedside.

## 2014-11-14 ENCOUNTER — Inpatient Hospital Stay
Admit: 2014-11-14 | Discharge: 2014-11-18 | DRG: 885 | Disposition: A | Discharge status: Home / Self Care | Payer: No Typology Code available for payment source | Source: Other Acute Inpatient Hospital | Attending: Psychiatry | Admitting: Psychiatry

## 2014-11-14 ENCOUNTER — Encounter: Payer: Self-pay | Admitting: Psychiatry

## 2014-11-14 DIAGNOSIS — F1220 Cannabis dependence, uncomplicated: Secondary | ICD-10-CM | POA: Diagnosis present

## 2014-11-14 DIAGNOSIS — S41132A Puncture wound without foreign body of left upper arm, initial encounter: Secondary | ICD-10-CM | POA: Diagnosis not present

## 2014-11-14 DIAGNOSIS — F1420 Cocaine dependence, uncomplicated: Secondary | ICD-10-CM | POA: Diagnosis present

## 2014-11-14 DIAGNOSIS — Z21 Asymptomatic human immunodeficiency virus [HIV] infection status: Secondary | ICD-10-CM

## 2014-11-14 DIAGNOSIS — Z9119 Patient's noncompliance with other medical treatment and regimen: Secondary | ICD-10-CM | POA: Diagnosis present

## 2014-11-14 DIAGNOSIS — R45851 Suicidal ideations: Secondary | ICD-10-CM | POA: Diagnosis present

## 2014-11-14 DIAGNOSIS — Z560 Unemployment, unspecified: Secondary | ICD-10-CM | POA: Diagnosis present

## 2014-11-14 DIAGNOSIS — B20 Human immunodeficiency virus [HIV] disease: Secondary | ICD-10-CM | POA: Diagnosis present

## 2014-11-14 DIAGNOSIS — F314 Bipolar disorder, current episode depressed, severe, without psychotic features: Secondary | ICD-10-CM | POA: Diagnosis not present

## 2014-11-14 DIAGNOSIS — F17210 Nicotine dependence, cigarettes, uncomplicated: Secondary | ICD-10-CM | POA: Diagnosis present

## 2014-11-14 DIAGNOSIS — G4700 Insomnia, unspecified: Secondary | ICD-10-CM | POA: Diagnosis present

## 2014-11-14 DIAGNOSIS — Z91120 Patient's intentional underdosing of medication regimen due to financial hardship: Secondary | ICD-10-CM | POA: Diagnosis present

## 2014-11-14 DIAGNOSIS — F1290 Cannabis use, unspecified, uncomplicated: Secondary | ICD-10-CM | POA: Diagnosis present

## 2014-11-14 DIAGNOSIS — J45909 Unspecified asthma, uncomplicated: Secondary | ICD-10-CM | POA: Diagnosis present

## 2014-11-14 DIAGNOSIS — R45850 Homicidal ideations: Secondary | ICD-10-CM | POA: Diagnosis present

## 2014-11-14 DIAGNOSIS — F315 Bipolar disorder, current episode depressed, severe, with psychotic features: Secondary | ICD-10-CM | POA: Diagnosis present

## 2014-11-14 DIAGNOSIS — F17200 Nicotine dependence, unspecified, uncomplicated: Secondary | ICD-10-CM | POA: Diagnosis present

## 2014-11-14 DIAGNOSIS — F319 Bipolar disorder, unspecified: Secondary | ICD-10-CM | POA: Diagnosis present

## 2014-11-14 HISTORY — DX: Asymptomatic human immunodeficiency virus (hiv) infection status: Z21

## 2014-11-14 MED ORDER — QUETIAPINE FUMARATE 100 MG PO TABS
100.0000 mg | ORAL_TABLET | Freq: Every day | ORAL | Status: DC
  Administered 2014-11-14: 100 mg via ORAL
  Filled 2014-11-14 (×2): qty 1

## 2014-11-14 MED ORDER — NON FORMULARY
1.0000 | Freq: Every day | Status: DC
Start: 2014-11-15 — End: 2014-11-14

## 2014-11-14 MED ORDER — DARUNAVIR ETHANOLATE 800 MG PO TABS
800.0000 mg | ORAL_TABLET | Freq: Every day | ORAL | Status: DC
  Administered 2014-11-15 – 2014-11-18 (×4): 800 mg via ORAL
  Filled 2014-11-14 (×4): qty 1

## 2014-11-14 MED ORDER — DARUNAVIR ETHANOLATE 800 MG PO TABS
800.0000 mg | ORAL_TABLET | Freq: Every day | ORAL | Status: DC
  Administered 2014-11-14: 800 mg via ORAL
  Filled 2014-11-14 (×2): qty 1

## 2014-11-14 MED ORDER — ELVITEG-COBIC-EMTRICIT-TENOFAF 150-150-200-10 MG PO TABS
1.0000 | ORAL_TABLET | Freq: Every day | ORAL | Status: DC
  Administered 2014-11-14: 1 via ORAL
  Filled 2014-11-14 (×2): qty 1

## 2014-11-14 MED ORDER — ALUM & MAG HYDROXIDE-SIMETH 200-200-20 MG/5ML PO SUSP
30.0000 mL | ORAL | Status: DC | PRN
Start: 2014-11-14 — End: 2014-11-18

## 2014-11-14 MED ORDER — NICOTINE 21 MG/24HR TD PT24
21.0000 mg | MEDICATED_PATCH | Freq: Every day | TRANSDERMAL | Status: DC
  Filled 2014-11-14 (×2): qty 1

## 2014-11-14 MED ORDER — ALBUTEROL SULFATE HFA 108 (90 BASE) MCG/ACT IN AERS
2.0000 | INHALATION_SPRAY | RESPIRATORY_TRACT | Status: DC | PRN
  Administered 2014-11-14: 2 via RESPIRATORY_TRACT
  Filled 2014-11-14: qty 6.7

## 2014-11-14 MED ORDER — ELVITEG-COBIC-EMTRICIT-TENOFAF 150-150-200-10 MG PO TABS
1.0000 | ORAL_TABLET | Freq: Every day | ORAL | Status: DC
  Administered 2014-11-15 – 2014-11-18 (×4): 1 via ORAL
  Filled 2014-11-14 (×6): qty 1

## 2014-11-14 MED ORDER — ACETAMINOPHEN 325 MG PO TABS: 650.0000 mg | ORAL_TABLET | Freq: Four times a day (QID) | ORAL | Status: DC | PRN

## 2014-11-14 MED ORDER — TRAZODONE HCL 100 MG PO TABS
100.0000 mg | ORAL_TABLET | Freq: Every day | ORAL | Status: DC
  Administered 2014-11-14 – 2014-11-15 (×2): 100 mg via ORAL
  Filled 2014-11-14 (×3): qty 1

## 2014-11-14 MED ORDER — MAGNESIUM HYDROXIDE 400 MG/5ML PO SUSP: 30.0000 mL | Freq: Every day | ORAL | Status: DC | PRN

## 2014-11-14 MED ORDER — ALBUTEROL SULFATE HFA 108 (90 BASE) MCG/ACT IN AERS
2.0000 | INHALATION_SPRAY | RESPIRATORY_TRACT | Status: DC | PRN
  Filled 2014-11-14: qty 6.7
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
## 2014-11-14 NOTE — Tx Team (Signed)
Initial Interdisciplinary Treatment Plan   PATIENT STRESSORS: Financial difficulties Health problems Medication change or noncompliance Substance abuse   PATIENT STRENGTHS: Average or above average intelligence Capable of independent living Communication skills Motivation for treatment/growth   PROBLEM LIST: Problem List/Patient Goals Date to be addressed Date deferred Reason deferred Estimated date of resolution  Bipolar Disorder 11/14/14     Suicidal ideation 11/14/14     Homicidal ideation 11/14/14                                          DISCHARGE CRITERIA:  Improved stabilization in mood, thinking, and/or behavior  PRELIMINARY DISCHARGE PLAN: Outpatient therapy  PATIENT/FAMIILY INVOLVEMENT: This treatment plan has been presented to and reviewed with the patient, John [name], and/or family member .  The patient and family have been given the opportunity to ask questions and make suggestions.  John [name] 11/14/2014, 3:36 PM
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
## 2014-11-14 NOTE — BHH Counselor (Signed)
[facility] reviewing chart for possible [facility] placement.

## 2014-11-14 NOTE — Progress Notes (Signed)
Per Conception [facility] at [facility], pt accepted to bed 23 by Dr. Bary Leriche. Admission is voluntary. Can arrive anytime, report number [phone]. Spoke with [facility] re: pt;'s placement.  Sharren Bridge, MSW, LCSW Clinical Social Work, Disposition  11/14/2014 [phone]
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
## 2014-11-15 MED ORDER — QUETIAPINE FUMARATE 200 MG PO TABS
200.0000 mg | ORAL_TABLET | Freq: Every day | ORAL | Status: DC
  Administered 2014-11-15 – 2014-11-17 (×3): 200 mg via ORAL
  Filled 2014-11-15 (×3): qty 1

## 2014-11-15 MED ORDER — DIVALPROEX SODIUM 500 MG PO DR TAB
500.0000 mg | DELAYED_RELEASE_TABLET | Freq: Three times a day (TID) | ORAL | Status: DC
  Administered 2014-11-15 – 2014-11-18 (×10): 500 mg via ORAL
  Filled 2014-11-15 (×10): qty 1

## 2014-11-15 NOTE — Progress Notes (Signed)
Recreation Therapy Notes  INPATIENT RECREATION THERAPY ASSESSMENT  Patient Details Name: John [name] MRN: [id number] DOB: Dec 11, 1970 Today's Date: 11/15/2014  Patient Stressors: Other (Comment) (Diagnosed with HIV)  Coping Skills:   Isolate, Arguments, Avoidance, Substance Abuse, Self-Injury, Exercise, Talking, Music, Sports  Personal Challenges: Anger, Communication, Concentration, Decision-Making, Expressing Yourself, Problem-Solving, Relationships, Self-Esteem/Confidence, Social Interaction, Stress Management, Substance Abuse  Leisure Interests (2+):  Individual - Other (Comment) (Walking through the park, feeding the birds)  Awareness of Community Resources:  No  Community Resources:     Current Use:    If no, Barriers?:    Patient Strengths:  "I'm alive"  Patient Identified Areas of Improvement:  All of them  Current Recreation Participation:  Nothing  Patient Goal for Hospitalization:  To get back on medication, go home, and try something new  [place] of Residence:  [employer] of Residence:  [place]   Current [place] (including self-harm):  Yes  Current [place]:  Yes  Consent to Intern Participation: N/A   [name], LRT/CTRS 11/15/2014, 2:05 PM

## 2014-11-15 NOTE — Plan of Care (Signed)
Problem: Diagnosis: Increased Risk For Suicide Attempt Goal: LTG-Patient Will Report Improved Mood and Deny Suicidal LTG (by discharge) Patient will report improved mood and deny suicidal ideation.  Outcome: Progressing Pt brighter Goal: STG-Patient Will Attend All Groups On The Unit Outcome: Progressing Attending groups on unit

## 2014-11-15 NOTE — Progress Notes (Signed)
D: Pt mood is depressed and his affect is flat/sad. Pt endorses passive SI but does contract for safety.   A: Emotional support provided. Medication and group compliant  R: Pt receptive and  is pleasant and cooperative with staff. Safety maintained on unit
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
## 2014-11-15 NOTE — Plan of Care (Signed)
Problem: Ineffective individual coping Goal: STG: Patient will remain free from self harm Outcome: Progressing Pt has remained free from self harm     

## 2014-11-15 NOTE — BHH Group Notes (Signed)
[facility] LCSW Aftercare Discharge Planning Group Note   11/15/2014 10:10 AM  Participation Quality:  Active   Mood/Affect:  Depressed and Flat  Depression Rating:  9  Anxiety Rating:  9  Thoughts of Suicide:  Yes Will you contract for safety?   Yes  Current AVH:  No  Plan for Discharge/Comments:  Pt will return home in [place]. Willing to follow up with [facility]. Pt expressed very high anxiety and depression along with SI.   Transportation Means:  Public   Supports: None   [place] MSW, [employer]
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
## 2014-11-15 NOTE — Progress Notes (Signed)
   11/15/14 1235  Clinical Encounter Type  Visited With Patient  Visit Type Spiritual support;Initial  Referral From Nurse  Consult/Referral To Chaplain  Provided pastoral presence and support to patient.  Pt said he was doing better and didn't need to talk to a chaplain now, but maybe later.  Pt thanked me for my visit.  [place]

## 2014-11-15 NOTE — BHH Group Notes (Signed)
[facility] Group Notes:  (Nursing/MHT/Case Management/Adjunct)  Date:  11/15/2014  Time:  11:46 AM  Type of Therapy:  Psychoeducational Skills  Participation Level:  Active  Participation Quality:  Appropriate  Affect:  Appropriate  Cognitive:  Alert  Insight:  Appropriate  Engagement in Group:  Engaged  Modes of Intervention:  Discussion and Education  Summary of Progress/Problems:  Drake Leach 11/15/2014, 11:46 AM

## 2014-11-15 NOTE — Progress Notes (Signed)
D: Pt is awake in bed this evening. Pt mood is depressed and his affect is flat/sad. Pt endorses passive SI but does contract for safety.    A: Writer provided emotional support and administered medications as prescribed.  R: Pt is isolating to his room tonight, but he is pleasant and cooperative with staff. He returned to bed following medication administration.

## 2014-11-15 NOTE — Progress Notes (Signed)
Recreation Therapy Notes  Date: 09.02.16 Time: 3:00 pm Location: Craft Room  Group Topic: Self-expression, coping skills  Goal Area(s) Addresses:  Patient will effectively use art as a means of self-expression. Patient will recognize positive benefit of self-expression. Patient will be able to identify one emotion experienced during group session. Patient will identify use of art as a coping skill.  Behavioral Response: Attentive, Interactive  Intervention: Two Faces of Me  Activity: Patients were given a blank face worksheet and instructed to draw a line down the middle. On one side patients were instructed to draw or write how they felt when they were admitted to the hospital and on the other side patients were instructed to draw or write how they want to feel once they are d/c.  Education: LRT educated patients on healthy coping skills.  Education Outcome: In group clarification offered  Clinical Observations/Feedback: Patient completed activity by writing how he felt when he was admitted and how he wants to feel when he is d/c. Patient contributed to group discussion by stating how he can use art as a coping skill.  [name], LRT/CTRS 11/15/2014 5:09 PM

## 2014-11-16 DIAGNOSIS — F314 Bipolar disorder, current episode depressed, severe, without psychotic features: Secondary | ICD-10-CM

## 2014-11-16 MED ORDER — ENSURE ENLIVE PO LIQD
237.0000 mL | Freq: Two times a day (BID) | ORAL | Status: DC
  Administered 2014-11-16 – 2014-11-18 (×4): 237 mL via ORAL

## 2014-11-16 MED ORDER — TRAZODONE HCL 50 MG PO TABS
150.0000 mg | ORAL_TABLET | Freq: Every day | ORAL | Status: DC
  Administered 2014-11-16 – 2014-11-17 (×2): 150 mg via ORAL
  Filled 2014-11-16 (×2): qty 1

## 2014-11-16 NOTE — BHH Group Notes (Signed)
[facility] Group Notes:  (Nursing/MHT/Case Management/Adjunct)  Date:  11/16/2014  Time:  8:44 AM  Type of Therapy:  Group Therapy  Participation Level:  Active  Participation Quality:  Appropriate and Attentive  Affect:  Appropriate  Cognitive:  Alert, Appropriate and Oriented  Insight:  Appropriate  Engagement in Group:  Engaged  Modes of Intervention:  Education  Summary of Progress/Problems:  John [name] 11/16/2014, 8:44 AM

## 2014-11-16 NOTE — Progress Notes (Signed)
D: Pt is asleep in bed this evening. Pt mood is depressed and his affect is masked. Pt continues to endorse passive SI but contracts for safety. Pt has remained in his room tonight only coming out for medication administration. Pt forwards little but is cooperative with staff.  A: Writer provided emotional support and administered medications as prescribed.  R: Pt came out of his room briefly for medications and then returned to bed.
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
## 2014-11-16 NOTE — Plan of Care (Signed)
Problem: Ineffective individual coping Goal: STG: Patient will remain free from self harm Outcome: Progressing No self harm reported or observed. Denies SI
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
## 2014-11-16 NOTE — BHH Group Notes (Signed)
[place] LCSW Group Therapy  11/16/2014 3:09 PM  Type of Therapy:  Group Therapy  Participation Level:  Did Not Attend  Modes of Intervention:  Discussion, Education, Socialization and Support  Summary of Progress/Problems:Pt will identify unhealthy thoughts and how they impact their emotions and behavior. Pt will be encouraged to discuss these thoughts, emotions and behaviors with the group.   [place] MSW, [place]  11/16/2014, 3:09 PM

## 2014-11-16 NOTE — Progress Notes (Signed)
Pt in and out of his room. In good spirits. Med compliant. Denies suicidal thoughts at this time.

## 2014-11-17 MED ORDER — ALBUTEROL SULFATE HFA 108 (90 BASE) MCG/ACT IN AERS: 2.0000 | INHALATION_SPRAY | RESPIRATORY_TRACT | Status: DC | PRN

## 2014-11-17 MED ORDER — TRAZODONE HCL 150 MG PO TABS: 150.0000 mg | ORAL_TABLET | Freq: Every day | ORAL | Status: DC

## 2014-11-17 MED ORDER — QUETIAPINE FUMARATE 200 MG PO TABS: 200.0000 mg | ORAL_TABLET | Freq: Every day | ORAL | Status: DC

## 2014-11-17 MED ORDER — DIVALPROEX SODIUM 500 MG PO DR TAB: 500.0000 mg | DELAYED_RELEASE_TABLET | Freq: Three times a day (TID) | ORAL | Status: DC

## 2014-11-17 NOTE — BHH Group Notes (Signed)
[place] LCSW Group Therapy  11/17/2014 3:11 PM  Type of Therapy:  Group Therapy  Participation Level:  Did Not Attend  Modes of Intervention:  Discussion, Education, Socialization and Support  Summary of Progress/Problems: Todays topic: Grudges  Patients will be encouraged to discuss their thoughts, feelings, and behaviors as to why one holds on to grudges and reasons why people have grudges. Patients will process the impact of grudges on their daily lives and identify thoughts and feelings related to holding grudges. Patients will identify feelings and thoughts related to what life would look like without grudges.   [place] MSW, [place]  11/17/2014, 3:11 PM

## 2014-11-17 NOTE — BHH Group Notes (Signed)
[name]:  (Nursing/MHT/Case Management/Adjunct)  Date:  11/17/2014  Time:  8:47 AM  Type of Therapy:  Group Therapy  Participation Level:  Active  Participation Quality:  Appropriate  Affect:  Appropriate  Cognitive:  Alert, Appropriate and Oriented  Insight:  Appropriate  Engagement in Group:  Engaged  Modes of Intervention:  Discussion  Summary of Progress/Problems:  John [name] 11/17/2014, 8:47 AM
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
## 2014-11-17 NOTE — BHH Suicide Risk Assessment (Signed)
[facility] Discharge Suicide Risk Assessment   Demographic Factors:  Male and Unemployed  Total Time spent with patient: 30 minutes    Psychiatric Specialty Exam: Physical Exam  ROS                                                         Have you used any form of tobacco in the last 30 days? (Cigarettes, Smokeless Tobacco, Cigars, and/or Pipes): Yes  Has this patient used any form of tobacco in the last 30 days? (Cigarettes, Smokeless Tobacco, Cigars, and/or Pipes) Yes, A prescription for an FDA-approved tobacco cessation medication was offered at discharge and the patient refused  Mental Status Per Nursing Assessment::   On Admission:     Current Mental Status by Physician: Calm, pleasant, cooperative. Mood euthymic affect reactive. Complex and cooperative. Denies SI, HI or evidence of mania, hypomania or psychosis  Loss Factors: Financial problems/change in socioeconomic status  Historical Factors: Prior suicide attempts  Risk Reduction Factors:   NA  Continued Clinical Symptoms:  Depression:   Comorbid alcohol abuse/dependence Impulsivity Alcohol/Substance Abuse/Dependencies More than one psychiatric diagnosis Previous Psychiatric Diagnoses and Treatments Medical Diagnoses and Treatments/Surgeries  Cognitive Features That Contribute To Risk:  None    Suicide Risk:  Minimal: No identifiable suicidal ideation.  Patients presenting with no risk factors but with morbid ruminations; may be classified as minimal risk based on the severity of the depressive symptoms  Principal Problem: Bipolar disorder, current episode depressed, severe, without psychotic features Discharge Diagnoses:  Patient Active Problem List   Diagnosis Date Noted  . Bipolar disorder, current episode depressed, severe, without psychotic features [F31.4] 11/14/2014  . Cannabis use disorder, severe, dependence [F12.20] 11/14/2014  . Asymptomatic HIV infection [Z21] 11/14/2014  .  Asthma, chronic [J45.909] 11/14/2014  . Tobacco use disorder [Z72.0] 11/14/2014     Is patient on multiple antipsychotic therapies at discharge:  No   Has Patient had three or more failed trials of antipsychotic monotherapy by history:  No  Recommended Plan for Multiple Antipsychotic Therapies: NA    Hildred Priest 11/18/2014, 7:17 AM

## 2014-11-17 NOTE — Plan of Care (Signed)
Problem: Diagnosis: Increased Risk For Suicide Attempt Goal: LTG-Patient Will Report Improved Mood and Deny Suicidal LTG (by discharge) Patient will report improved mood and deny suicidal ideation.  Outcome: Progressing Denies suicidal ideation.     
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
## 2014-11-17 NOTE — BHH Suicide Risk Assessment (Signed)
[place] INPATIENT:  Family/Significant Other Suicide Prevention Education  Suicide Prevention Education:  Patient Refusal for Family/Significant Other Suicide Prevention Education: The patient John [name] has refused to provide written consent for family/significant other to be provided Family/Significant Other Suicide Prevention Education during admission and/or prior to discharge.  Physician notified.  SPE completed with pt and he was encouraged to share information provided in SPI pamphlet with support network, ask questions, and talk about any concerns relating to SPE. Pt denies SI/HI/AVH and verbalized understanding of information presented.    [place] MSW, [place]  11/17/2014, 3:29 PM

## 2014-11-17 NOTE — Progress Notes (Signed)
Pleasant & cooperative.Denies depression,SI and A/V hallucination.Appropriate with staff & peers.Compliant with meds.Attended groups.

## 2014-11-18 ENCOUNTER — Encounter (HOSPITAL_COMMUNITY): Payer: Self-pay | Admitting: Nurse Practitioner

## 2014-11-18 LAB — LIPID PANEL
CHOL/HDL RATIO: 3.5 ratio
Cholesterol: 149 mg/dL (ref 0–200)
HDL: 42 mg/dL (ref >40)
LDL CALC: 81 mg/dL (ref 0–99)
Triglycerides: 132 mg/dL (ref <150)
VLDL: 26 mg/dL (ref 0–40)

## 2014-11-18 LAB — HEPATIC FUNCTION PANEL
ALBUMIN: 3.4 g/dL — AB (ref 3.5–5.0)
ALT: 13 U/L — AB (ref 17–63)
AST: 20 U/L (ref 15–41)
Alkaline Phosphatase: 76 U/L (ref 38–126)
Bilirubin, Direct: <0.1 mg/dL — ABNORMAL LOW (ref 0.1–0.5)
TOTAL PROTEIN: 6.2 g/dL — AB (ref 6.5–8.1)
Total Bilirubin: <0.1 mg/dL — ABNORMAL LOW (ref 0.3–1.2)

## 2014-11-18 LAB — VALPROIC ACID LEVEL: VALPROIC ACID LVL: 95 ug/mL (ref 50.0–100.0)

## 2014-11-18 LAB — HEMOGLOBIN A1C: HEMOGLOBIN A1C: 5.2 % (ref 4.0–6.0)

## 2014-11-18 NOTE — BHH Group Notes (Signed)
[facility] LCSW Aftercare Discharge Planning Group Note   11/18/2014 11:38 AM  Participation Quality:  Patient attended group and was attentive but only participated when asked questions by facilitator. Patient shared his goal is to "go home where I have my dogs, my car, and my bank account". Patient will discharge today home.   Mood/Affect:  guarded  Thoughts of Suicide:  No Will you contract for safety?   NA  Current AVH:  No  Plan for Discharge/Comments:  Patient will discharge home with his aunt  Transportation Means: Patient will need a cab voucher back to [place]: Patient has community mental health follow up, aunt works at [employer], and patient has assistance with obtaining his meds at [facility], Corene Cornea T, MSW, [employer]

## 2014-11-18 NOTE — Progress Notes (Signed)
Pt discharged home. DC instructions provided and explained. Medications reviewed. Rx given. All questions answered. Pt denies SI, HI, AVH. Pt stable at discharge. 

## 2014-11-18 NOTE — BHH Suicide Risk Assessment (Signed)
[place] INPATIENT:  Family/Significant Other Suicide Prevention Education  Suicide Prevention Education:  Education Completed; Ginny Forth (aunt) [phone] has been identified by the patient as the family member/significant other with whom the patient will be residing, and identified as the person(s) who will aid the patient in the event of a mental health crisis (suicidal ideations/suicide attempt).  With written consent from the patient, the family member/significant other has been provided the following suicide prevention education, prior to the and/or following the discharge of the patient.  The suicide prevention education provided includes the following:  Suicide risk factors  Suicide prevention and interventions  National Suicide Hotline telephone number  [place] assessment telephone number  [facility] Emergency Assistance [place] and/or Residential Mobile Crisis Unit telephone number  Request made of family/significant other to:  Remove weapons (e.g., guns, rifles, knives), all items previously/currently identified as safety concern.    Remove drugs/medications (over-the-counter, prescriptions, illicit drugs), all items previously/currently identified as a safety concern.  The family member/significant other verbalizes understanding of the suicide prevention education information provided.  The family member/significant other agrees to remove the items of safety concern listed above.  Keene Breath, MSW, LCSWA 11/18/2014, 10:19 AM

## 2014-11-18 NOTE — Progress Notes (Signed)
Recreation Therapy Notes  INPATIENT RECREATION TR PLAN  Patient Details Name: John [name] MRN: [id number] DOB: 05-07-1970 Today's Date: 11/18/2014  Rec Therapy Plan Is patient appropriate for Therapeutic Recreation?: Yes Treatment times per week: At least once a week TR Treatment/Interventions: 1:1 session, Group participation (Comment) (Appropriate participation in daily recreation therapy tx)  Discharge Criteria Pt will be discharged from therapy if:: Discharged Treatment plan/goals/alternatives discussed and agreed upon by:: Patient/family  Discharge Summary Short term goals set: See Care Plan Short term goals met: Complete Progress toward goals comments: One-to-one attended Which groups?: Other (Comment) (Self-expression) One-to-one attended: Self-esteem, stress management, anger management  Reason goals not met: N/A Therapeutic equipment acquired: None Reason patient discharged from therapy: Discharge from hospital Pt/family agrees with progress & goals achieved: Yes Date patient discharged from therapy: 11/18/14   [name], LRT/CTRS 11/18/2014, 1:45 PM

## 2014-11-18 NOTE — Plan of Care (Signed)
Problem: [facility] Participation in Recreation Therapeutic Interventions Goal: STG-Patient will demonstrate improved self esteem by identif STG: Self-Esteem - Within 3 treatment sessions, patient will verbalize at least 5 positive affirmation statements in one treatment session to increase self-esteem post d/c.  Outcome: Completed/Met Date Met:  11/18/14 Treatment Session 1; Completed 1 out of 1: At approximately 11:40 am, LRT met with patient in patient room. Patient verbalized 5 positive affirmation statements. Patient reported he felt like "a different person". LRT encouraged patient to continue saying positive affirmation statements.  [name], LRT/CTRS 09.05.16 1:39 pm Goal: STG-Patient will identify at least five coping skills for ** STG: Coping Skills - Within 3 treatment sessions, patient will verbalize at least 5 coping skills for anger in one treatment session to increase anger management skills post d/c.  Outcome: Completed/Met Date Met:  11/18/14 Treatment Session 1; Completed 1 out of 1: At approximately 11:40 am, LRT met with patient in patient room. Patient verbalized 5 coping skills for anger. Patient verbalized what triggers him to get angry, how his body responds to anger, and how he is going to remember to use his healthy coping skills. LRT provided suggestions as well.  [name], LRT/CTRS 09.05.16 1:41 pm Goal: STG-Other Recreation Therapy Goal (Specify) STG: Stress Management - Within 3 treatment sessions, patient will verbalize understanding of stress management techniques in one treatment session to increase stress management skills post d/c.  Outcome: Completed/Met Date Met:  11/18/14 Treatment Session 1; Completed 1 out of 1: At approximately 11:40 am, LRT met with patient in patient room. LRT educated and provided patient with handouts on stress management techniques. Patient verbalized understanding. LRT encouraged patient to read over and practice the  stress management techniques.   [name], LRT/CTRS 09.05.16 1:43 pm

## 2014-11-18 NOTE — Progress Notes (Signed)
  [facility] Adult Case Management Discharge Plan :  Will you be returning to the same living situation after discharge:  Yes,  back to his home At discharge, do you have transportation home?: No. Cab voucher will be provided Do you have the ability to pay for your medications: No. Patient is referred to [facility] who can assist with medications  Release of information consent forms completed and in the chart;  Patient's signature needed at discharge.  Patient to Follow up at: Follow-up Information    Follow up with [facility] in 3 days.   Why:  For your hospital follow up appointment, you will need to go to the walk in clinic. Monday- Friday 8:00am -10:00am. Please take all of your hospital discharge paperwork.    Contact information:   [address] [place], [place] Ph [phone] Fax [phone]       Patient denies SI/HI: Yes,  patient denies SI/HI    Safety Planning and Suicide Prevention discussed: Yes,  SPE discussed with patient and his aunt Ginny Forth [phone]  Have you used any form of tobacco in the last 30 days? (Cigarettes, Smokeless Tobacco, Cigars, and/or Pipes): Yes  Has patient been referred to the Quitline?: Patient refused referral  Keene Breath, MSW, LCSWA 11/18/2014, 10:22 AM

## 2014-12-10 ENCOUNTER — Ambulatory Visit: Payer: Self-pay | Admitting: Infectious Disease

## 2015-01-22 NOTE — Progress Notes (Signed)
[facility] notified via fax. Landis Gandy, RN

## 2015-02-04 ENCOUNTER — Encounter: Payer: Self-pay | Admitting: *Deleted

## 2015-02-12 ENCOUNTER — Encounter: Payer: Self-pay | Admitting: Infectious Disease

## 2015-02-12 ENCOUNTER — Ambulatory Visit (INDEPENDENT_AMBULATORY_CARE_PROVIDER_SITE_OTHER): Payer: Self-pay | Admitting: Infectious Disease

## 2015-02-12 VITALS — BP 127/84 | HR 97 | Temp 97.2°F | Wt 170.0 lb

## 2015-02-12 DIAGNOSIS — Z23 Encounter for immunization: Secondary | ICD-10-CM

## 2015-02-12 DIAGNOSIS — Z113 Encounter for screening for infections with a predominantly sexual mode of transmission: Secondary | ICD-10-CM

## 2015-02-12 DIAGNOSIS — B20 Human immunodeficiency virus [HIV] disease: Secondary | ICD-10-CM

## 2015-02-12 DIAGNOSIS — Z79899 Other long term (current) drug therapy: Secondary | ICD-10-CM

## 2015-02-12 LAB — LIPID PANEL
CHOL/HDL RATIO: 2.4 ratio (ref <=5.0)
Cholesterol: 126 mg/dL (ref 125–200)
HDL: 53 mg/dL (ref >=40)
LDL Cholesterol: 29 mg/dL (ref <130)
Triglycerides: 218 mg/dL — ABNORMAL HIGH (ref <150)
VLDL: 44 mg/dL — ABNORMAL HIGH (ref <30)

## 2015-02-12 LAB — COMPLETE METABOLIC PANEL WITH GFR
ALT: 17 U/L (ref 9–46)
AST: 26 U/L (ref 10–40)
Albumin: 4.1 g/dL (ref 3.6–5.1)
Alkaline Phosphatase: 95 U/L (ref 40–115)
BUN: 13 mg/dL (ref 7–25)
CALCIUM: 9.1 mg/dL (ref 8.6–10.3)
CO2: 19 mmol/L — ABNORMAL LOW (ref 20–31)
CREATININE: 1.04 mg/dL (ref 0.60–1.35)
Chloride: 98 mmol/L (ref 98–110)
GFR, Est African American: >89 mL/min (ref >=60)
GFR, Est Non African American: 88 mL/min (ref >=60)
Glucose, Bld: 96 mg/dL (ref 65–99)
POTASSIUM: 4.1 mmol/L (ref 3.5–5.3)
Sodium: 137 mmol/L (ref 135–146)
Total Bilirubin: 1.1 mg/dL (ref 0.2–1.2)
Total Protein: 7.5 g/dL (ref 6.1–8.1)

## 2015-02-12 LAB — CBC WITH DIFFERENTIAL/PLATELET
BASOS PCT: 1 % (ref 0–1)
Basophils Absolute: 0 10*3/uL (ref 0.0–0.1)
EOS ABS: 0.1 10*3/uL (ref 0.0–0.7)
Eosinophils Relative: 2 % (ref 0–5)
HCT: 45.1 % (ref 39.0–52.0)
Hemoglobin: 15.4 g/dL (ref 13.0–17.0)
Lymphocytes Relative: 37 % (ref 12–46)
Lymphs Abs: 1.5 10*3/uL (ref 0.7–4.0)
MCH: 33 pg (ref 26.0–34.0)
MCHC: 34.1 g/dL (ref 30.0–36.0)
MCV: 96.6 fL (ref 78.0–100.0)
MONO ABS: 0.5 10*3/uL (ref 0.1–1.0)
MONOS PCT: 12 % (ref 3–12)
MPV: 9.2 fL (ref 8.6–12.4)
Neutro Abs: 1.9 10*3/uL (ref 1.7–7.7)
Neutrophils Relative %: 48 % (ref 43–77)
PLATELETS: 336 10*3/uL (ref 150–400)
RBC: 4.67 MIL/uL (ref 4.22–5.81)
RDW: 13 % (ref 11.5–15.5)
WBC: 4 10*3/uL (ref 4.0–10.5)

## 2015-02-12 LAB — PHOSPHORUS: PHOSPHORUS: 2.4 mg/dL — AB (ref 2.5–4.5)
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
## 2015-02-13 LAB — T-HELPER CELL (CD4) - (RCID CLINIC ONLY)
CD4 T CELL HELPER: 29 % — AB (ref 33–55)
CD4 T Cell Abs: 460 /uL (ref 400–2700)

## 2015-02-13 LAB — HIV RNA, RTPCR W/R GT (RTI, PI,INT)

## 2015-02-13 LAB — MICROALBUMIN / CREATININE URINE RATIO
Creatinine, Urine: 421 mg/dL — ABNORMAL HIGH (ref 20–370)
MICROALB UR: 8.8 mg/dL
Microalb Creat Ratio: 21 mcg/mg creat (ref <30)

## 2015-02-13 LAB — URINE CYTOLOGY ANCILLARY ONLY
Chlamydia: NEGATIVE
Neisseria Gonorrhea: NEGATIVE

## 2015-02-13 LAB — RPR

## 2015-02-19 ENCOUNTER — Encounter: Payer: Self-pay | Admitting: *Deleted

## 2015-02-27 ENCOUNTER — Encounter (INDEPENDENT_AMBULATORY_CARE_PROVIDER_SITE_OTHER): Payer: Federal, State, Local not specified - Other | Admitting: *Deleted

## 2015-02-27 VITALS — BP 115/73 | HR 83 | Temp 98.3°F | Resp 16 | Wt 186.8 lb

## 2015-02-27 DIAGNOSIS — Z006 Encounter for examination for normal comparison and control in clinical research program: Secondary | ICD-10-CM

## 2015-02-27 LAB — CBC WITH DIFFERENTIAL/PLATELET
Basophils Absolute: 0 10*3/uL (ref 0.0–0.1)
Basophils Relative: 1 % (ref 0–1)
Eosinophils Absolute: 0.2 10*3/uL (ref 0.0–0.7)
Eosinophils Relative: 4 % (ref 0–5)
HCT: 42.9 % (ref 39.0–52.0)
HEMOGLOBIN: 14.9 g/dL (ref 13.0–17.0)
Lymphocytes Relative: 45 % (ref 12–46)
Lymphs Abs: 1.9 10*3/uL (ref 0.7–4.0)
MCH: 33.6 pg (ref 26.0–34.0)
MCHC: 34.7 g/dL (ref 30.0–36.0)
MCV: 96.6 fL (ref 78.0–100.0)
MONO ABS: 0.5 10*3/uL (ref 0.1–1.0)
MPV: 9.2 fL (ref 8.6–12.4)
Monocytes Relative: 11 % (ref 3–12)
Neutro Abs: 1.7 10*3/uL (ref 1.7–7.7)
Neutrophils Relative %: 39 % — ABNORMAL LOW (ref 43–77)
Platelets: 364 10*3/uL (ref 150–400)
RBC: 4.44 MIL/uL (ref 4.22–5.81)
RDW: 13.3 % (ref 11.5–15.5)
WBC: 4.3 10*3/uL (ref 4.0–10.5)

## 2015-02-27 LAB — CD4/CD8 (T-HELPER/T-SUPPRESSOR CELL)
CD4%: 29.6
CD4: 503
CD8 % Suppressor T Cell: 51.2
CD8: 870

## 2015-02-28 LAB — HIV-1 RNA QUANT-NO REFLEX-BLD
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
## 2015-03-19 ENCOUNTER — Encounter: Payer: Self-pay | Admitting: Infectious Disease

## 2015-03-30 ENCOUNTER — Emergency Department (HOSPITAL_COMMUNITY): Payer: Federal, State, Local not specified - Other

## 2015-03-30 ENCOUNTER — Emergency Department (HOSPITAL_COMMUNITY)
Admission: EM | Admit: 2015-03-30 | Discharge: 2015-03-31 | Disposition: A | Discharge status: Other Acute Inpatient Hospital | Payer: Federal, State, Local not specified - Other | Attending: Emergency Medicine | Admitting: Emergency Medicine

## 2015-03-30 ENCOUNTER — Encounter (HOSPITAL_COMMUNITY): Payer: Self-pay | Admitting: Emergency Medicine

## 2015-03-30 DIAGNOSIS — F1010 Alcohol abuse, uncomplicated: Secondary | ICD-10-CM | POA: Insufficient documentation

## 2015-03-30 DIAGNOSIS — I10 Essential (primary) hypertension: Secondary | ICD-10-CM | POA: Insufficient documentation

## 2015-03-30 DIAGNOSIS — Z590 Homelessness: Secondary | ICD-10-CM | POA: Insufficient documentation

## 2015-03-30 DIAGNOSIS — H53149 Visual discomfort, unspecified: Secondary | ICD-10-CM | POA: Insufficient documentation

## 2015-03-30 DIAGNOSIS — F1210 Cannabis abuse, uncomplicated: Secondary | ICD-10-CM | POA: Insufficient documentation

## 2015-03-30 DIAGNOSIS — F329 Major depressive disorder, single episode, unspecified: Secondary | ICD-10-CM | POA: Insufficient documentation

## 2015-03-30 DIAGNOSIS — Z21 Asymptomatic human immunodeficiency virus [HIV] infection status: Secondary | ICD-10-CM | POA: Insufficient documentation

## 2015-03-30 DIAGNOSIS — F17210 Nicotine dependence, cigarettes, uncomplicated: Secondary | ICD-10-CM | POA: Insufficient documentation

## 2015-03-30 DIAGNOSIS — J45901 Unspecified asthma with (acute) exacerbation: Secondary | ICD-10-CM | POA: Insufficient documentation

## 2015-03-30 DIAGNOSIS — R45851 Suicidal ideations: Secondary | ICD-10-CM | POA: Insufficient documentation

## 2015-03-30 DIAGNOSIS — G8929 Other chronic pain: Secondary | ICD-10-CM | POA: Insufficient documentation

## 2015-03-30 DIAGNOSIS — Z79899 Other long term (current) drug therapy: Secondary | ICD-10-CM | POA: Insufficient documentation

## 2015-03-30 DIAGNOSIS — F419 Anxiety disorder, unspecified: Secondary | ICD-10-CM | POA: Insufficient documentation

## 2015-03-30 LAB — RAPID URINE DRUG SCREEN, HOSP PERFORMED
Amphetamines: NOT DETECTED
BARBITURATES: NOT DETECTED
Benzodiazepines: NOT DETECTED
Cocaine: NOT DETECTED
Opiates: NOT DETECTED
TETRAHYDROCANNABINOL: POSITIVE — AB

## 2015-03-30 LAB — CBC WITH DIFFERENTIAL/PLATELET
Basophils Absolute: 0 10*3/uL (ref 0.0–0.1)
Basophils Relative: 1 %
Eosinophils Absolute: 0.2 10*3/uL (ref 0.0–0.7)
Eosinophils Relative: 3 %
HEMATOCRIT: 42.8 % (ref 39.0–52.0)
Hemoglobin: 14.7 g/dL (ref 13.0–17.0)
LYMPHS PCT: 48 %
Lymphs Abs: 3 10*3/uL (ref 0.7–4.0)
MCH: 33.6 pg (ref 26.0–34.0)
MCHC: 34.3 g/dL (ref 30.0–36.0)
MCV: 97.7 fL (ref 78.0–100.0)
MONO ABS: 0.4 10*3/uL (ref 0.1–1.0)
MONOS PCT: 6 %
NEUTROS ABS: 2.6 10*3/uL (ref 1.7–7.7)
Neutrophils Relative %: 42 %
Platelets: 334 10*3/uL (ref 150–400)
RBC: 4.38 MIL/uL (ref 4.22–5.81)
RDW: 13.1 % (ref 11.5–15.5)
WBC: 6.2 10*3/uL (ref 4.0–10.5)

## 2015-03-30 LAB — URINALYSIS, ROUTINE W REFLEX MICROSCOPIC
BILIRUBIN URINE: NEGATIVE
GLUCOSE, UA: NEGATIVE mg/dL
HGB URINE DIPSTICK: NEGATIVE
KETONES UR: NEGATIVE mg/dL
Leukocytes, UA: NEGATIVE
Nitrite: NEGATIVE
PH: 5.5 (ref 5.0–8.0)
Protein, ur: NEGATIVE mg/dL
Specific Gravity, Urine: <1.005 — ABNORMAL LOW (ref 1.005–1.030)

## 2015-03-30 LAB — COMPREHENSIVE METABOLIC PANEL
ALBUMIN: 4 g/dL (ref 3.5–5.0)
ALK PHOS: 96 U/L (ref 38–126)
ALT: 20 U/L (ref 17–63)
ANION GAP: 16 — AB (ref 5–15)
AST: 24 U/L (ref 15–41)
BUN: 10 mg/dL (ref 6–20)
CALCIUM: 9.1 mg/dL (ref 8.9–10.3)
CO2: 19 mmol/L — AB (ref 22–32)
Chloride: 108 mmol/L (ref 101–111)
Creatinine, Ser: 1.02 mg/dL (ref 0.61–1.24)
GFR calc Af Amer: >60 mL/min (ref >60)
GFR calc non Af Amer: >60 mL/min (ref >60)
GLUCOSE: 98 mg/dL (ref 65–99)
POTASSIUM: 4 mmol/L (ref 3.5–5.1)
SODIUM: 143 mmol/L (ref 135–145)
Total Bilirubin: 0.4 mg/dL (ref 0.3–1.2)
Total Protein: 6.9 g/dL (ref 6.5–8.1)

## 2015-03-30 LAB — ETHANOL: Alcohol, Ethyl (B): 284 mg/dL — ABNORMAL HIGH (ref <5)

## 2015-03-30 MED ORDER — TETRACAINE HCL 0.5 % OP SOLN
1.0000 [drp] | Freq: Once | OPHTHALMIC | Status: DC
  Filled 2015-03-30: qty 2

## 2015-03-30 MED ORDER — TRAZODONE HCL 50 MG PO TABS
50.0000 mg | ORAL_TABLET | Freq: Every day | ORAL | Status: DC
  Administered 2015-03-30: 50 mg via ORAL
  Filled 2015-03-30: qty 1

## 2015-03-30 MED ORDER — GABAPENTIN 100 MG PO CAPS
200.0000 mg | ORAL_CAPSULE | Freq: Three times a day (TID) | ORAL | Status: DC
  Administered 2015-03-30 – 2015-03-31 (×4): 200 mg via ORAL
  Filled 2015-03-30 (×4): qty 2

## 2015-03-30 MED ORDER — PREDNISONE 20 MG PO TABS
60.0000 mg | ORAL_TABLET | Freq: Every day | ORAL | Status: DC
  Administered 2015-03-31: 60 mg via ORAL
  Filled 2015-03-30: qty 3

## 2015-03-30 MED ORDER — ELVITEG-COBIC-EMTRICIT-TENOFAF 150-150-200-10 MG PO TABS
1.0000 | ORAL_TABLET | Freq: Every day | ORAL | Status: DC
  Administered 2015-03-30 – 2015-03-31 (×2): 1 via ORAL
  Filled 2015-03-30 (×3): qty 1

## 2015-03-30 MED ORDER — LORAZEPAM 1 MG PO TABS
1.0000 mg | ORAL_TABLET | Freq: Three times a day (TID) | ORAL | Status: DC | PRN
  Administered 2015-03-30: 1 mg via ORAL
  Filled 2015-03-30: qty 1

## 2015-03-30 MED ORDER — ALBUTEROL SULFATE (2.5 MG/3ML) 0.083% IN NEBU
5.0000 mg | INHALATION_SOLUTION | Freq: Once | RESPIRATORY_TRACT | Status: AC
  Administered 2015-03-30: 5 mg via RESPIRATORY_TRACT
  Filled 2015-03-30: qty 6

## 2015-03-30 MED ORDER — MOMETASONE FURO-FORMOTEROL FUM 100-5 MCG/ACT IN AERO
2.0000 | INHALATION_SPRAY | Freq: Two times a day (BID) | RESPIRATORY_TRACT | Status: DC
  Administered 2015-03-30 – 2015-03-31 (×2): 2 via RESPIRATORY_TRACT
  Filled 2015-03-30: qty 8.8

## 2015-03-30 MED ORDER — ACETAMINOPHEN 325 MG PO TABS: 650.0000 mg | ORAL_TABLET | Freq: Once | ORAL | Status: DC

## 2015-03-30 MED ORDER — FLUORESCEIN SODIUM 1 MG OP STRP
1.0000 | ORAL_STRIP | Freq: Once | OPHTHALMIC | Status: DC
  Filled 2015-03-30: qty 1

## 2015-03-30 MED ORDER — IPRATROPIUM BROMIDE 0.02 % IN SOLN
0.5000 mg | Freq: Once | RESPIRATORY_TRACT | Status: AC
  Administered 2015-03-30: 0.5 mg via RESPIRATORY_TRACT
  Filled 2015-03-30: qty 2.5

## 2015-03-30 MED ORDER — ALBUTEROL SULFATE HFA 108 (90 BASE) MCG/ACT IN AERS
2.0000 | INHALATION_SPRAY | RESPIRATORY_TRACT | Status: DC | PRN
  Filled 2015-03-30: qty 6.7

## 2015-03-30 MED ORDER — PREDNISONE 20 MG PO TABS
60.0000 mg | ORAL_TABLET | Freq: Once | ORAL | Status: AC
  Administered 2015-03-30: 60 mg via ORAL
  Filled 2015-03-30: qty 3

## 2015-03-30 MED ORDER — DARUNAVIR ETHANOLATE 800 MG PO TABS
800.0000 mg | ORAL_TABLET | Freq: Every day | ORAL | Status: DC
  Administered 2015-03-30 – 2015-03-31 (×2): 800 mg via ORAL
  Filled 2015-03-30 (×6): qty 1

## 2015-03-30 MED ORDER — DIVALPROEX SODIUM ER 500 MG PO TB24
500.0000 mg | ORAL_TABLET | Freq: Two times a day (BID) | ORAL | Status: DC
  Administered 2015-03-30 – 2015-03-31 (×3): 500 mg via ORAL
  Filled 2015-03-30 (×3): qty 1

## 2015-03-30 MED ORDER — HALOPERIDOL LACTATE 5 MG/ML IJ SOLN
3.0000 mg | Freq: Once | INTRAMUSCULAR | Status: AC
  Administered 2015-03-30: 3 mg via INTRAMUSCULAR
  Filled 2015-03-30: qty 1

## 2015-03-30 NOTE — ED Notes (Signed)
Pt belongings inventoried, witnessed by 2 RNs. Pt asleep at this time.

## 2015-03-30 NOTE — ED Notes (Signed)
Pt ordered a breakfast tray.
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
## 2015-03-30 NOTE — ED Notes (Signed)
Patient has been changed into purple scrubs and wanded by security.

## 2015-03-30 NOTE — ED Notes (Signed)
telepsych is currently being conducted.

## 2015-03-30 NOTE — ED Notes (Signed)
Pt is agitated, anxious. Is holding firmly to both bed rails shaking them. "I  Need my meds to sleep and relax". Asking for seroquel but hasn't taken in 3 weeks. MD made aware, order for haldol obtained. Pt agreeable to shot.

## 2015-03-30 NOTE — ED Notes (Signed)
Discussed medications with dr. Christy Gentles as patient requested, MD reviews, no new orders at this time for home meds.
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
## 2015-03-30 NOTE — ED Notes (Signed)
Breakfast tray ordered by Network engineer.

## 2015-03-30 NOTE — Progress Notes (Signed)
Disposition CSW completed patient referrals to the following inpatient psych facilities:  [place] [facility]  CSW will follow patient for placement needs.  [facility] Disposition CSW [phone]
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
## 2015-03-31 ENCOUNTER — Encounter (HOSPITAL_COMMUNITY): Payer: Self-pay | Admitting: *Deleted

## 2015-03-31 ENCOUNTER — Inpatient Hospital Stay (HOSPITAL_COMMUNITY)
Admission: AD | Admit: 2015-03-31 | Discharge: 2015-04-09 | DRG: 885 | Disposition: A | Discharge status: Home / Self Care | Payer: Federal, State, Local not specified - Other | Source: Intra-hospital | Attending: Psychiatry | Admitting: Psychiatry

## 2015-03-31 DIAGNOSIS — F1410 Cocaine abuse, uncomplicated: Secondary | ICD-10-CM | POA: Diagnosis present

## 2015-03-31 DIAGNOSIS — F3132 Bipolar disorder, current episode depressed, moderate: Secondary | ICD-10-CM

## 2015-03-31 DIAGNOSIS — F3130 Bipolar disorder, current episode depressed, mild or moderate severity, unspecified: Secondary | ICD-10-CM

## 2015-03-31 DIAGNOSIS — R45850 Homicidal ideations: Secondary | ICD-10-CM | POA: Diagnosis not present

## 2015-03-31 DIAGNOSIS — Z811 Family history of alcohol abuse and dependence: Secondary | ICD-10-CM

## 2015-03-31 DIAGNOSIS — Z62810 Personal history of physical and sexual abuse in childhood: Secondary | ICD-10-CM | POA: Diagnosis present

## 2015-03-31 DIAGNOSIS — Z590 Homelessness: Secondary | ICD-10-CM

## 2015-03-31 DIAGNOSIS — F17200 Nicotine dependence, unspecified, uncomplicated: Secondary | ICD-10-CM | POA: Diagnosis present

## 2015-03-31 DIAGNOSIS — J45909 Unspecified asthma, uncomplicated: Secondary | ICD-10-CM | POA: Diagnosis present

## 2015-03-31 DIAGNOSIS — R45851 Suicidal ideations: Secondary | ICD-10-CM | POA: Diagnosis not present

## 2015-03-31 DIAGNOSIS — F17210 Nicotine dependence, cigarettes, uncomplicated: Secondary | ICD-10-CM | POA: Diagnosis present

## 2015-03-31 DIAGNOSIS — K5900 Constipation, unspecified: Secondary | ICD-10-CM | POA: Diagnosis not present

## 2015-03-31 DIAGNOSIS — B20 Human immunodeficiency virus [HIV] disease: Secondary | ICD-10-CM | POA: Diagnosis present

## 2015-03-31 DIAGNOSIS — F3164 Bipolar disorder, current episode mixed, severe, with psychotic features: Principal | ICD-10-CM | POA: Diagnosis present

## 2015-03-31 DIAGNOSIS — F1020 Alcohol dependence, uncomplicated: Secondary | ICD-10-CM | POA: Diagnosis present

## 2015-03-31 DIAGNOSIS — F1220 Cannabis dependence, uncomplicated: Secondary | ICD-10-CM | POA: Diagnosis present

## 2015-03-31 DIAGNOSIS — I10 Essential (primary) hypertension: Secondary | ICD-10-CM | POA: Diagnosis present

## 2015-03-31 DIAGNOSIS — F419 Anxiety disorder, unspecified: Secondary | ICD-10-CM | POA: Diagnosis present

## 2015-03-31 DIAGNOSIS — F1421 Cocaine dependence, in remission: Secondary | ICD-10-CM | POA: Diagnosis present

## 2015-03-31 DIAGNOSIS — Z818 Family history of other mental and behavioral disorders: Secondary | ICD-10-CM

## 2015-03-31 DIAGNOSIS — G4700 Insomnia, unspecified: Secondary | ICD-10-CM | POA: Diagnosis present

## 2015-03-31 DIAGNOSIS — F4310 Post-traumatic stress disorder, unspecified: Secondary | ICD-10-CM | POA: Diagnosis present

## 2015-03-31 DIAGNOSIS — Z9119 Patient's noncompliance with other medical treatment and regimen: Secondary | ICD-10-CM

## 2015-03-31 DIAGNOSIS — F1221 Cannabis dependence, in remission: Secondary | ICD-10-CM | POA: Clinically undetermined

## 2015-03-31 DIAGNOSIS — Z91013 Allergy to seafood: Secondary | ICD-10-CM

## 2015-03-31 HISTORY — DX: Bipolar disorder, current episode depressed, mild or moderate severity, unspecified: F31.30

## 2015-03-31 MED ORDER — QUETIAPINE FUMARATE 100 MG PO TABS
100.0000 mg | ORAL_TABLET | Freq: Once | ORAL | Status: AC
  Administered 2015-03-31: 100 mg via ORAL
  Filled 2015-03-31: qty 1

## 2015-03-31 MED ORDER — DARUNAVIR ETHANOLATE 800 MG PO TABS: 800.0000 mg | ORAL_TABLET | Freq: Every day | ORAL | Status: DC

## 2015-03-31 MED ORDER — ELVITEG-COBIC-EMTRICIT-TENOFAF 150-150-200-10 MG PO TABS
1.0000 | ORAL_TABLET | Freq: Every day | ORAL | Status: DC
Start: 2015-03-31 — End: 2015-04-09
  Administered 2015-03-31 – 2015-04-09 (×9): 1 via ORAL
  Filled 2015-03-31 (×11): qty 1

## 2015-03-31 MED ORDER — DIVALPROEX SODIUM ER 500 MG PO TB24
500.0000 mg | ORAL_TABLET | Freq: Every day | ORAL | Status: DC
  Administered 2015-03-31 – 2015-04-02 (×3): 500 mg via ORAL
  Filled 2015-03-31 (×4): qty 1

## 2015-03-31 MED ORDER — ALUM & MAG HYDROXIDE-SIMETH 200-200-20 MG/5ML PO SUSP: 30.0000 mL | ORAL | Status: DC | PRN

## 2015-03-31 MED ORDER — QUETIAPINE FUMARATE 100 MG PO TABS
100.0000 mg | ORAL_TABLET | Freq: Every day | ORAL | Status: DC
  Administered 2015-03-31: 100 mg via ORAL
  Filled 2015-03-31: qty 1

## 2015-03-31 MED ORDER — TRAZODONE HCL 100 MG PO TABS: 100.0000 mg | ORAL_TABLET | Freq: Every evening | ORAL | Status: DC | PRN

## 2015-03-31 MED ORDER — ACETAMINOPHEN 325 MG PO TABS: 650.0000 mg | ORAL_TABLET | Freq: Four times a day (QID) | ORAL | Status: DC | PRN

## 2015-03-31 MED ORDER — GABAPENTIN 100 MG PO CAPS
200.0000 mg | ORAL_CAPSULE | Freq: Two times a day (BID) | ORAL | Status: DC
  Administered 2015-03-31 – 2015-04-04 (×8): 200 mg via ORAL
  Filled 2015-03-31 (×11): qty 2

## 2015-03-31 MED ORDER — LORAZEPAM 1 MG PO TABS
1.0000 mg | ORAL_TABLET | Freq: Four times a day (QID) | ORAL | Status: DC | PRN
Start: 2015-03-31 — End: 2015-04-02
  Administered 2015-03-31 – 2015-04-01 (×2): 1 mg via ORAL
  Filled 2015-03-31 (×2): qty 1

## 2015-03-31 MED ORDER — MAGNESIUM HYDROXIDE 400 MG/5ML PO SUSP: 30.0000 mL | Freq: Every day | ORAL | Status: DC | PRN

## 2015-03-31 NOTE — ED Notes (Signed)
Requested meds from pharmacy  

## 2015-03-31 NOTE — BH Assessment (Signed)
[place] Assessment Progress Note  Patient presents this date with S/I and H/I stating he had no specific plan but reports he has been off his medications for the last three months due to not having the resources to get them filled. Patient has reported increased aggression and AH stating he would harm anyone that "Gets in his face." Patient was positive for Benzo's and THC on admission and a BAL of 284. Patient has conflicting reports in reference to when he last took his medications reporting to the admission nurse that he took them yesterday but informed this Probation officer that it has been three months. Patient will be started back on medications per Rosana Hoes NP with behavior's being monitored. Patient states he has been receiving services from [facility]. Patient will be re-assessed in the a.m. to determine disposition.
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
## 2015-03-31 NOTE — Progress Notes (Addendum)
Patient ID: John [name], male   DOB: 1971/01/28, 45 y.o.   MRN: [id number] Patient admitted to observation unit stating that he was homicidal toward anyone who "got in his way". Also stated that he was hearing voices to harm himself and others,  Contaracts verbally for safety.Reported he smokes pot 3 x a week and drinks two sixteen ounce beers a couple of times a month. Denies any withdrawal symptoms. Denies pain, Vitals are stable. Patient states he is positive for SI, HI, AVH. Patient reports he is homeless but stated he was living with his aunt.  Patient gave conflicted answers about meds telling this writer he got his meds from [place] and took them yesterday. Patient is HIV positive. Oriented to unit. Patient is HIV positive.

## 2015-03-31 NOTE — ED Notes (Signed)
[place] transportation called. 

## 2015-04-01 DIAGNOSIS — F1220 Cannabis dependence, uncomplicated: Secondary | ICD-10-CM | POA: Diagnosis not present

## 2015-04-01 DIAGNOSIS — G4700 Insomnia, unspecified: Secondary | ICD-10-CM | POA: Diagnosis present

## 2015-04-01 DIAGNOSIS — F419 Anxiety disorder, unspecified: Secondary | ICD-10-CM | POA: Diagnosis present

## 2015-04-01 DIAGNOSIS — F4310 Post-traumatic stress disorder, unspecified: Secondary | ICD-10-CM | POA: Diagnosis present

## 2015-04-01 DIAGNOSIS — J45909 Unspecified asthma, uncomplicated: Secondary | ICD-10-CM | POA: Diagnosis present

## 2015-04-01 DIAGNOSIS — R45850 Homicidal ideations: Secondary | ICD-10-CM | POA: Diagnosis present

## 2015-04-01 DIAGNOSIS — I10 Essential (primary) hypertension: Secondary | ICD-10-CM | POA: Diagnosis present

## 2015-04-01 DIAGNOSIS — F3132 Bipolar disorder, current episode depressed, moderate: Secondary | ICD-10-CM | POA: Diagnosis not present

## 2015-04-01 DIAGNOSIS — K5900 Constipation, unspecified: Secondary | ICD-10-CM | POA: Diagnosis not present

## 2015-04-01 DIAGNOSIS — Z9119 Patient's noncompliance with other medical treatment and regimen: Secondary | ICD-10-CM | POA: Diagnosis not present

## 2015-04-01 DIAGNOSIS — Z590 Homelessness: Secondary | ICD-10-CM | POA: Diagnosis not present

## 2015-04-01 DIAGNOSIS — B20 Human immunodeficiency virus [HIV] disease: Secondary | ICD-10-CM | POA: Diagnosis present

## 2015-04-01 DIAGNOSIS — F3164 Bipolar disorder, current episode mixed, severe, with psychotic features: Secondary | ICD-10-CM | POA: Diagnosis not present

## 2015-04-01 DIAGNOSIS — F1020 Alcohol dependence, uncomplicated: Secondary | ICD-10-CM | POA: Diagnosis not present

## 2015-04-01 DIAGNOSIS — Z818 Family history of other mental and behavioral disorders: Secondary | ICD-10-CM | POA: Diagnosis not present

## 2015-04-01 DIAGNOSIS — Z91013 Allergy to seafood: Secondary | ICD-10-CM | POA: Diagnosis not present

## 2015-04-01 DIAGNOSIS — Z62810 Personal history of physical and sexual abuse in childhood: Secondary | ICD-10-CM | POA: Diagnosis present

## 2015-04-01 DIAGNOSIS — F17210 Nicotine dependence, cigarettes, uncomplicated: Secondary | ICD-10-CM | POA: Diagnosis present

## 2015-04-01 DIAGNOSIS — R45851 Suicidal ideations: Secondary | ICD-10-CM | POA: Diagnosis present

## 2015-04-01 DIAGNOSIS — Z811 Family history of alcohol abuse and dependence: Secondary | ICD-10-CM | POA: Diagnosis not present

## 2015-04-01 DIAGNOSIS — F1490 Cocaine use, unspecified, uncomplicated: Secondary | ICD-10-CM | POA: Diagnosis not present

## 2015-04-01 MED ORDER — DIVALPROEX SODIUM ER 500 MG PO TB24: 500.0000 mg | ORAL_TABLET | Freq: Every day | ORAL | Status: DC

## 2015-04-01 MED ORDER — QUETIAPINE FUMARATE 100 MG PO TABS: 100.0000 mg | ORAL_TABLET | Freq: Every day | ORAL | Status: DC

## 2015-04-01 MED ORDER — OLANZAPINE 5 MG PO TBDP
5.0000 mg | ORAL_TABLET | Freq: Three times a day (TID) | ORAL | Status: DC | PRN
  Administered 2015-04-01: 5 mg via ORAL
  Filled 2015-04-01: qty 1

## 2015-04-01 MED ORDER — GABAPENTIN 100 MG PO CAPS: 200.0000 mg | ORAL_CAPSULE | Freq: Two times a day (BID) | ORAL | Status: DC

## 2015-04-01 MED ORDER — QUETIAPINE FUMARATE 200 MG PO TABS
200.0000 mg | ORAL_TABLET | Freq: Every day | ORAL | Status: DC
  Administered 2015-04-02: 200 mg via ORAL
  Filled 2015-04-01 (×3): qty 1

## 2015-04-01 MED ORDER — TRAZODONE HCL 100 MG PO TABS: 100.0000 mg | ORAL_TABLET | Freq: Every evening | ORAL | Status: DC | PRN

## 2015-04-01 NOTE — Progress Notes (Signed)
Shift note:  John [name] in bed #3.  Diagnosis: Bi Polar affect.  Alert and shift change.  Asking for snacks (peanutbutter and crackers).  Pt alert and cooperative.  Denies SI and HI at this time.  Pt constantly asking for food. Pt given snacks.  Pt constantly observed except during visits to bathroom.

## 2015-04-01 NOTE — Clinical Social Work Note (Signed)
CSW met with pt and NP to discuss discharge needs. Pt is aware of the [facility] and attends.  Pt also attends [employer] for meals but reported that he does not like it there stating "they are all crazy".  Pt was referred to Path program at [facility] for help regarding being homeless.  CSW spoke with the [facility] coordinator who stated that pt is very familiar with their services and attends often.  Pt also attends [facility] for his medication needs.    Dede Query, LCSW BHH/Triage/TTS

## 2015-04-01 NOTE — Progress Notes (Signed)
Pt did not attend wrap-up group   
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
## 2015-04-02 ENCOUNTER — Encounter (HOSPITAL_COMMUNITY): Payer: Self-pay | Admitting: Psychiatry

## 2015-04-02 DIAGNOSIS — R45851 Suicidal ideations: Secondary | ICD-10-CM

## 2015-04-02 DIAGNOSIS — F1421 Cocaine dependence, in remission: Secondary | ICD-10-CM | POA: Diagnosis present

## 2015-04-02 DIAGNOSIS — F17200 Nicotine dependence, unspecified, uncomplicated: Secondary | ICD-10-CM

## 2015-04-02 DIAGNOSIS — F3164 Bipolar disorder, current episode mixed, severe, with psychotic features: Principal | ICD-10-CM

## 2015-04-02 DIAGNOSIS — F1410 Cocaine abuse, uncomplicated: Secondary | ICD-10-CM | POA: Diagnosis present

## 2015-04-02 DIAGNOSIS — F1490 Cocaine use, unspecified, uncomplicated: Secondary | ICD-10-CM

## 2015-04-02 DIAGNOSIS — F1020 Alcohol dependence, uncomplicated: Secondary | ICD-10-CM

## 2015-04-02 DIAGNOSIS — F1221 Cannabis dependence, in remission: Secondary | ICD-10-CM | POA: Clinically undetermined

## 2015-04-02 DIAGNOSIS — F1220 Cannabis dependence, uncomplicated: Secondary | ICD-10-CM

## 2015-04-02 DIAGNOSIS — F4310 Post-traumatic stress disorder, unspecified: Secondary | ICD-10-CM

## 2015-04-02 HISTORY — DX: Cannabis dependence, uncomplicated: F12.20

## 2015-04-02 HISTORY — DX: Cocaine abuse, uncomplicated: F14.10

## 2015-04-02 MED ORDER — CHLORDIAZEPOXIDE HCL 25 MG PO CAPS
25.0000 mg | ORAL_CAPSULE | Freq: Four times a day (QID) | ORAL | Status: AC
  Administered 2015-04-02 (×3): 25 mg via ORAL
  Filled 2015-04-02 (×3): qty 1

## 2015-04-02 MED ORDER — CHLORDIAZEPOXIDE HCL 25 MG PO CAPS: 25.0000 mg | ORAL_CAPSULE | Freq: Four times a day (QID) | ORAL | Status: AC | PRN

## 2015-04-02 MED ORDER — DIVALPROEX SODIUM ER 500 MG PO TB24
500.0000 mg | ORAL_TABLET | Freq: Every day | ORAL | Status: AC
Start: 2015-04-02 — End: 2015-04-02
  Administered 2015-04-02: 500 mg via ORAL
  Filled 2015-04-02: qty 1

## 2015-04-02 MED ORDER — HYDROXYZINE HCL 25 MG PO TABS
25.0000 mg | ORAL_TABLET | Freq: Four times a day (QID) | ORAL | Status: AC | PRN
  Administered 2015-04-02: 25 mg via ORAL
  Filled 2015-04-02: qty 1

## 2015-04-02 MED ORDER — DARUNAVIR ETHANOLATE 800 MG PO TABS
800.0000 mg | ORAL_TABLET | Freq: Every day | ORAL | Status: DC
  Administered 2015-04-02 – 2015-04-09 (×8): 800 mg via ORAL
  Filled 2015-04-02 (×9): qty 1

## 2015-04-02 MED ORDER — ADULT MULTIVITAMIN W/MINERALS CH
1.0000 | ORAL_TABLET | Freq: Every day | ORAL | Status: DC
  Administered 2015-04-02 – 2015-04-09 (×8): 1 via ORAL
  Filled 2015-04-02 (×10): qty 1

## 2015-04-02 MED ORDER — ENSURE ENLIVE PO LIQD
237.0000 mL | Freq: Two times a day (BID) | ORAL | Status: DC
Start: 2015-04-02 — End: 2015-04-09
  Administered 2015-04-02 – 2015-04-08 (×13): 237 mL via ORAL

## 2015-04-02 MED ORDER — HALOPERIDOL 5 MG PO TABS
5.0000 mg | ORAL_TABLET | Freq: Four times a day (QID) | ORAL | Status: DC | PRN
  Administered 2015-04-04: 5 mg via ORAL
  Filled 2015-04-02: qty 1

## 2015-04-02 MED ORDER — CHLORDIAZEPOXIDE HCL 25 MG PO CAPS: 25.0000 mg | ORAL_CAPSULE | Freq: Once | ORAL | Status: DC

## 2015-04-02 MED ORDER — DIVALPROEX SODIUM ER 500 MG PO TB24
1000.0000 mg | ORAL_TABLET | Freq: Every day | ORAL | Status: DC
  Administered 2015-04-03 – 2015-04-08 (×6): 1000 mg via ORAL
  Filled 2015-04-02 (×2): qty 2
  Filled 2015-04-02 (×4): qty 14
  Filled 2015-04-02: qty 2

## 2015-04-02 MED ORDER — THIAMINE HCL 100 MG/ML IJ SOLN
100.0000 mg | Freq: Once | INTRAMUSCULAR | Status: AC
  Administered 2015-04-02: 100 mg via INTRAMUSCULAR
  Filled 2015-04-02: qty 2

## 2015-04-02 MED ORDER — MIRTAZAPINE 7.5 MG PO TABS
7.5000 mg | ORAL_TABLET | Freq: Every day | ORAL | Status: DC
Start: 2015-04-02 — End: 2015-04-09
  Administered 2015-04-02 – 2015-04-08 (×7): 7.5 mg via ORAL
  Filled 2015-04-02: qty 7
  Filled 2015-04-02 (×2): qty 1
  Filled 2015-04-02: qty 7
  Filled 2015-04-02 (×2): qty 1
  Filled 2015-04-02 (×2): qty 7

## 2015-04-02 MED ORDER — CHLORDIAZEPOXIDE HCL 25 MG PO CAPS
25.0000 mg | ORAL_CAPSULE | Freq: Three times a day (TID) | ORAL | Status: AC
  Administered 2015-04-03 (×3): 25 mg via ORAL
  Filled 2015-04-02 (×3): qty 1

## 2015-04-02 MED ORDER — ONDANSETRON 4 MG PO TBDP: 4.0000 mg | ORAL_TABLET | Freq: Four times a day (QID) | ORAL | Status: AC | PRN

## 2015-04-02 MED ORDER — BENZTROPINE MESYLATE 1 MG PO TABS
1.0000 mg | ORAL_TABLET | Freq: Four times a day (QID) | ORAL | Status: DC | PRN
  Administered 2015-04-04: 1 mg via ORAL
  Filled 2015-04-02: qty 1

## 2015-04-02 MED ORDER — ALBUTEROL SULFATE HFA 108 (90 BASE) MCG/ACT IN AERS
INHALATION_SPRAY | RESPIRATORY_TRACT | Status: AC
  Administered 2015-04-02: 07:00:00
  Filled 2015-04-02: qty 6.7

## 2015-04-02 MED ORDER — CHLORDIAZEPOXIDE HCL 25 MG PO CAPS
25.0000 mg | ORAL_CAPSULE | ORAL | Status: AC
  Administered 2015-04-04 (×2): 25 mg via ORAL
  Filled 2015-04-02 (×2): qty 1

## 2015-04-02 MED ORDER — CHLORDIAZEPOXIDE HCL 25 MG PO CAPS
25.0000 mg | ORAL_CAPSULE | Freq: Every day | ORAL | Status: AC
  Administered 2015-04-05: 25 mg via ORAL
  Filled 2015-04-02: qty 1

## 2015-04-02 MED ORDER — VITAMIN B-1 100 MG PO TABS
100.0000 mg | ORAL_TABLET | Freq: Every day | ORAL | Status: DC
  Administered 2015-04-03 – 2015-04-09 (×7): 100 mg via ORAL
  Filled 2015-04-02 (×8): qty 1

## 2015-04-02 MED ORDER — ALBUTEROL SULFATE HFA 108 (90 BASE) MCG/ACT IN AERS
2.0000 | INHALATION_SPRAY | Freq: Four times a day (QID) | RESPIRATORY_TRACT | Status: DC | PRN
  Administered 2015-04-05 – 2015-04-08 (×5): 2 via RESPIRATORY_TRACT

## 2015-04-02 MED ORDER — LOPERAMIDE HCL 2 MG PO CAPS: 2.0000 mg | ORAL_CAPSULE | ORAL | Status: AC | PRN

## 2015-04-02 NOTE — Progress Notes (Signed)
Initial Nutrition Assessment  DOCUMENTATION CODES:   Not applicable  INTERVENTION:  -Ensure Enlive po BID, each supplement provides 350 kcal and 20 grams of protein   NUTRITION DIAGNOSIS:   Inadequate oral intake related to chronic illness as evidenced by per patient/family report.  GOAL:   Patient will meet greater than or equal to 90% of their needs  MONITOR:   PO intake, Supplement acceptance, Labs, I & O's  REASON FOR ASSESSMENT:   Malnutrition Screening Tool    ASSESSMENT:   John [name] is a 45 year old man, known to [name] from prior admissions to unit.He has been diagnosed with Bipolar Disorder in the past, and has a history of Alcohol Dependence .He reports worsening depression, sadness, anxiety, and auditory hallucinations. Of note, he states has not been taking his psychiatric medications for at least three months. He stated his medications were helping. He does not remember name, but states " they were the same ones I got prescribed last time I was here"  Spoke with pt at bedside. Pt endorses poor PO intake and weight loss from 198-174# over the course of 3 months when he was not taking his meds. According to chart, pt was 195# as of 1/16, was up to 186# 12/16 but weight seems to fluctuate heavily. From 12/16-1/17, pt demonstrates a 12#/6.5% severe wt loss in 1 month. Per psych note, pt has been non-compliant with medications, is well known to facility. Pt will be restarted on medications during stay.  Nutrition-Focused physical exam completed. Findings are no fat depletion, no muscle depletion, and no edema.   Labs and Medications reviewed.  Diet Order:  Diet regular Room service appropriate?: Yes; Fluid consistency:: Thin  Skin:  Reviewed, no issues  Last BM:  03/30/2015  Height:   Ht Readings from Last 1 Encounters:  03/31/15 5' 7.5" (1.715 m)    Weight:   Wt Readings from Last 1 Encounters:  03/31/15 174 lb (78.926 kg)    Ideal Body Weight:  70  kg  BMI:  Body mass index is 26.83 kg/(m^2).  Estimated Nutritional Needs:   Kcal:  2000-2400  Protein:  75-85 grams  Fluid:  >/= 2L  EDUCATION NEEDS:   No education needs identified at this time  [name]. Dario Yono, MS, RD LDN After Hours/Weekend Pager [phone]

## 2015-04-02 NOTE — Progress Notes (Signed)
1:1 initiation note:  Pt up at nurses station with increased anxiety. Reports auditory hallucinations. Pt reports he is unable to contract for safety. Reports he has a strong urge to hurt himself. Reports plan to hurt himself with a plastic spoon while in the hospital. Unable to redirect pt behavior. Charge nurse notified. NP notified. [facility] to One initated for safety. Will continue to monitor.

## 2015-04-02 NOTE — Progress Notes (Signed)
Adult Psychoeducational Group Note  Date:  04/02/2015 Time:  8:27 PM  Group Topic/Focus:  Wrap-Up Group:   The focus of this group is to help patients review their daily goal of treatment and discuss progress on daily workbooks.  Participation Level:  Did Not Attend  Pt appeared to be asleep during wrap-up group.   [name] 04/02/2015, 9:09 PM
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
## 2015-04-02 NOTE — Progress Notes (Signed)
D. Pt was in room and in bed for much of the evening, little to no interaction in milieu. Pt spoke about how he was feeling prior to coming in and spoke about how he has been feeling suicidal but able to contract for safety in the hospital. Pt refused bedtime seroquel this evening and did not verbalize any complaints of pain. A. Support provided. R. Safety maintained, will continue to monitor.
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
## 2015-04-02 NOTE — Progress Notes (Signed)
D: Per patient self inventory form pt reports he slept poor last night with the use of sleep medication. Pt reports a poop appeite, low energy level, poor concentration. Pt rates depression 8/10, hopelessness 1/10, anxiety 7/10- all on 0-10 scale, 10 being the worse. Pt denies S/S of withdrawal. Pt denies physical problems. Pt endorses auditory hallucinations. Pt reports passive SI, but reports he feels safe in the hospital. Pt denies plan to hurt self. Pt denies HI. Pt reports his goal is "geting well" and that he will "force" to meet his goal.  A:Special checks q 15 mins in place for safety. Medication administered per MD order (See eMAR) Encouragement and support provided.  R:safety maintained. Compliant with medication regimen. Will continue to monitor,

## 2015-04-02 NOTE — BHH Group Notes (Signed)
[facility] LCSW Aftercare Discharge Planning Group Note   04/02/2015 1:28 PM  Participation Quality:  Minimal  Mood/Affect:  Flat  Depression Rating:    Anxiety Rating:    Thoughts of Suicide:  No Will you contract for safety?   NA  Current AVH:  Yes  Plan for Discharge/Comments:  States he came in because he was hearing voices.  Ran out of meds awhile ago.  Staying with an aunt and plans to return there.  Signed release for [facility].  Stated he has been turned down for disability, and would like help.  Signed a release for [facility].  Transportation Means: bus  Supports: aunt  John [name], Ernestine Mcmurray

## 2015-04-02 NOTE — BHH Group Notes (Signed)
[facility] LCSW Group Therapy  04/02/2015 1:27 PM  Type of Therapy: Group Therapy  Participation Level: Active  Participation Quality: Attentive  Affect: Flat  Cognitive: Oriented  Insight: Limited  Engagement in Therapy: Engaged  Modes of Intervention: Discussion and Socialization  Summary of Progress/Problems: John [name] from the [place] was here to tell his story of recovery and play his guitar. Stayed for the entire group. Pt was pleasant but nodded off quite a few times.   Kara Mead. Marshell Levan 04/02/2015 1:27 PM

## 2015-04-03 MED ORDER — QUETIAPINE FUMARATE 400 MG PO TABS
400.0000 mg | ORAL_TABLET | Freq: Every day | ORAL | Status: DC
  Administered 2015-04-03 – 2015-04-06 (×4): 400 mg via ORAL
  Filled 2015-04-03: qty 7
  Filled 2015-04-03: qty 2
  Filled 2015-04-03 (×2): qty 1
  Filled 2015-04-03: qty 2
  Filled 2015-04-03: qty 7
  Filled 2015-04-03: qty 1
  Filled 2015-04-03: qty 7

## 2015-04-03 NOTE — Progress Notes (Signed)
[facility] 1:1 Observation Documentation  For the first (8) hours following discontinuation of 1:1 precautions, a progress note entry by nursing staff should be documented at least every 2 hours, reflecting the patient's behavior, condition, mood, and conversation.  Use the progress notes for additional entries.  Time 1:1 discontinued:  10:30AM  Patient's Behavior:  Patient is calm and in the dayroom watching TV.  Patient's Condition:  No complaint offered or reported.  Patient in the dayroom eating lunch.  Patient's Conversation:  Minimal conversation with staff and peers.  Mart Piggs 04/03/2015, 12:30PM

## 2015-04-03 NOTE — Tx Team (Signed)
Interdisciplinary Treatment Plan Update (Adult)  Date:  04/03/2015 Time Reviewed:  8:57 AM  Progress in Treatment: Attending groups: Yes. Participating in groups: Yes. Taking medication as prescribed:  Yes. Tolerating medication:  Yes. Family/Significant othe contact made: No Patient understands diagnosis:  Yes, as evidenced by seeking help with SI , HI and AH  Discussing patient identified problems/goals with staff:  Yes, see initial care plan. Medical problems stabilized or resolved:  Yes Denies suicidal/homicidal ideation:No  Endorses SI and was on 1:1 last night due to self-harming behavior Issues/concerns per patient self-inventory: No. Other:  New problem(s) identified:   Discharge Plan or Barriers: See below   Reason for Continuation of Hospitalization: Depression Hallucinations Medication stabilization Suicidal ideation  Comments: John [name] is a 45 year old man, known to [name] from prior admissions to unit.He has been diagnosed with Bipolar Disorder in the past, and has a history of Alcohol Dependence .He reports worsening depression, sadness, anxiety, and auditory hallucinations. Of note, he states has not been taking his psychiatric medications for at least three months. He stated his medications were helping. He does not remember name, but states " they were the same ones I got prescribed last time I was here" ( As per chart notes, these were Seroquel and Depakote) He presents depressed, sad, and has some restlessness and distal tremors.He is facing significant and chronic psychosocial stressors, to include homelessness , chronic disease, unemployment, no source of income. His alcohol level on admission was 284 but patient has reported frequency of twice a months. However, he does not appear to be truthful regarding the extent of his alcohol usage. Patient's urine drug screen has been consistently positive for marijuana during his last several visits to the hospital.  Librium, Depakote, Neurontin, Remeron, Seroquel trial  Estimated length of stay: 4-5 days  New goal(s):  Review of initial/current patient goals per problem list:  1. Goal(s): Patient will participate in aftercare plan  Met:Yes  Target date: at discharge  As evidenced by: Patient will participate within aftercare plan AEB aftercare provider and housing plan at discharge being identified.  04/02/15: Pt will return home with Aunt and follow-up outpt with [facility].  2. Goal (s): Patient will exhibit decreased depressive symptoms and suicidal ideations.  Met: No   Target date: at discharge  As evidenced by: Patient will utilize self rating of depression at 3 or below and demonstrate decreased signs of depression or be deemed stable for discharge by MD. 04/02/15: Pt rates depression as 8/10 and reports passive SI.  3. Goal(s): Patient will demonstrate decreased signs and symptoms of anxiety.  Met: No   Target date: at discharge  As evidenced by: Patient will utilize self rating of anxiety at 3 or below and demonstrated decreased signs of anxiety, or be deemed stable for discharge by MD  04/02/15: Pt rates anxiety as 7/10.  4. Goal(s): Patient will demonstrate decreased signs of psychosis.  Met: No   Target date:at discharge  As evidenced by: Patient will demonstrate decreased signs of psychosis as evidenced by a reduction in AVH, paranoia, and/or delusions.   04/02/15: Pt endorses auditory hallucinations.    Attendees: Patient:  04/03/2015 8:57 AM  Family:   04/03/2015 8:57 AM  Physician:  Dr. Ursula Alert, MD 04/03/2015 8:57 AM  Nursing: Festus Aloe, RN 04/03/2015 8:57 AM  Case Manager:  Roque Lias, LCSW 04/03/2015 8:57 AM  Counselor:  Matthew Saras, MSW Intern 04/03/2015 8:57 AM  Other:   04/03/2015 8:57 AM  Other:  04/03/2015 8:57 AM  Other:   04/03/2015 8:57 AM  Other:  04/03/2015 8:57 AM  Other:    Other:    Other:    Other:    Other:    Other:       Scribe for Treatment Team:   Georga Kaufmann, MSW Intern 04/03/2015 8:57 AM

## 2015-04-03 NOTE — BHH Group Notes (Signed)
[facility] Group Notes:  (Nursing/MHT/Case Management/Adjunct)  Date:  04/03/2015  Time:  11:15 AM  Type of Therapy:  Nurse Education  Participation Level:  Active  Participation Quality:  Appropriate  Affect:  Appropriate  Cognitive:  Appropriate  Insight:  None  Engagement in Group:  Resistant  Modes of Intervention:  Activity, Discussion and Education   Summary of Progress/Problems: Topic was on leisure and lifestyle changes. Discussed the importance of choosing a healthy leisure activities. Group encouraged to surround themselves with positive and healthy group/support system when changing to a healthy lifestyle. Patient attended but did not participate.  Mart Piggs 04/03/2015, 11:15 AM

## 2015-04-03 NOTE — Progress Notes (Signed)
1:1 Note: Patient maintained on constant supervision for safety.  No behavioral issues noted.  Reports suicidal thoughts but contracts for safety.  Denies auditory and visual hallucinations.  Reports withdrawal symptoms of runny nose and chilling on self inventory form.  Patient safe with staff supervision.

## 2015-04-03 NOTE — Progress Notes (Signed)
[facility] 1:1 Observation Documentation  For the first (8) hours following discontinuation of 1:1 precautions, a progress note entry by nursing staff should be documented at least every 2 hours, reflecting the patient's behavior, condition, mood, and conversation.  Use the progress notes for additional entries.  Time 1:1 discontinued:  10:30AM  Patient's Behavior:  Patient is in his room sleeping.  Patient is calm and cooperative.  Patient's Condition:  Patient is calm and in his room sleeping.  Patient's Conversation:  Patient was verbally aggressive towards staff regarding double portion for meal.  Patient was redirected without any issues.   Mart Piggs 04/03/2015, 6:49 PM

## 2015-04-03 NOTE — Progress Notes (Signed)
Pt endorses SI and command VH to harm self; was able to verbally contract for safety. Pt at this time however, is in bed resting with eyes closed. Pt does not look to be in any distress at this time. 1:1 staff is present in room with Pt at this time. 1:1 monitoring continues for Pt's safety. 15-minute safety checks also continues at this time.

## 2015-04-03 NOTE — BHH Group Notes (Signed)
[place] [facility] LCSW Group Therapy  04/03/2015 1:15 pm  Type of Therapy: Process Group Therapy  Participation Level:  Active  Participation Quality:  Appropriate  Affect:  Flat  Cognitive:  Oriented  Insight:  Improving  Engagement in Group:  Limited  Engagement in Therapy:  Limited  Modes of Intervention:  Activity, Clarification, Education, Problem-solving and Support  Summary of Progress/Problems: Today's group addressed the issue of overcoming obstacles.  Patients were asked to identify their biggest obstacle post d/c that stands in the way of their on-going success, and then problem solve as to how to manage this.  Amazingly, Merry Proud identified his biggest obstacle as quitting cigarettes.  Admitted he is ambivalent, and then talked himself into continuing to smoke. After he had finished his job of particpating in group, put his head back and went to sleep for the rest of the time.  Woke up as we were wrapping.  Overheard another pt talk about his "voices" and described his.  Stated they are usually manageable, but when he is trying to sleep and they are keeping him awake, he feels like killing himself "even though I tell myself they are just in my head."  Roque Lias B 04/03/2015   1:19 PM

## 2015-04-03 NOTE — Progress Notes (Signed)
[facility] 1:1 Observation Documentation  For the first (8) hours following discontinuation of 1:1 precautions, a progress note entry by nursing staff should be documented at least every 2 hours, reflecting the patient's behavior, condition, mood, and conversation.  Use the progress notes for additional entries.  Time 1:1 discontinued:  10:30AM  Patient's Behavior: Patient is calm and pleasant.   Patient's Condition:  Patient is up and in the dayroom.  Minimal interaction with staff and peers.  Patient's Conversation:  Patient concern about discharge plan.  Patient verbalizing readiness for discharge.  Mart Piggs 04/03/2015, 2:30PM

## 2015-04-03 NOTE — Progress Notes (Signed)
[facility] 1:1 Observation Documentation  For the first (8) hours following discontinuation of 1:1 precautions, a progress note entry by nursing staff should be documented at least every 2 hours, reflecting the patient's behavior, condition, mood, and conversation.  Use the progress notes for additional entries.  Time 1:1 discontinued:  10:30AM  Patient's Behavior:  Patient is calm and cooperative.    Patient's Condition:  Patient is in the dayroom watching TV.  Patient's Conversation:  Minimal conversation with staff and peers.  John [name] 04/03/2015, 4:30PM

## 2015-04-03 NOTE — Progress Notes (Signed)
Pt at this time is in bed resting with eyes closed. Pt does not look to be in any distress at this time. 1:1 staff is present in room with Pt at this time. 1:1 monitoring continues for Pt's safety. 15-minute safety checks also continues at this time.

## 2015-04-03 NOTE — Progress Notes (Signed)
1:1 Note  Pt continues to be in bed resting with eyes closed. Pt does not look to be in any distress at this time. 1:1 staff is present in room with Pt at this time. 1:1 monitoring continues for Pt's safety. 15-minute safety checks also continues at this time.
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
## 2015-04-03 NOTE — Progress Notes (Signed)
Pt did not attend wrap up group meeting.

## 2015-04-04 MED ORDER — GABAPENTIN 300 MG PO CAPS
300.0000 mg | ORAL_CAPSULE | Freq: Two times a day (BID) | ORAL | Status: DC
  Administered 2015-04-04 – 2015-04-09 (×10): 300 mg via ORAL
  Filled 2015-04-04 (×2): qty 1
  Filled 2015-04-04: qty 14
  Filled 2015-04-04 (×2): qty 1
  Filled 2015-04-04 (×5): qty 14
  Filled 2015-04-04: qty 1
  Filled 2015-04-04 (×2): qty 14

## 2015-04-04 NOTE — Progress Notes (Signed)
D: Pt is very flat, isolative and withdrawn to room, making minimal eye contact. Pt endorses command AH to harm self; Pt was able to verbally contract for safety. He states, "They are constantly asking me to hurt myself. Pt also endorses severe depression and anxiety. Pt denies pain. Pt remained nonviolent. A: Medications offered as prescribed.  Support, encouragement, and safe environment provided.  15-minute safety checks continue. R: Pt was med compliant.  Pt did not attend wrap up group meeting. Safety checks continue
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
## 2015-04-04 NOTE — Progress Notes (Signed)
[place] Group Notes:  (Nursing/MHT/Case Management/Adjunct)  Date:  04/04/2015  Time:  8:56 PM  Type of Therapy:  Psychoeducational Skills  Participation Level:  Minimal  Participation Quality:  Inattentive  Affect:  Flat  Cognitive:  Lacking  Insight:  Appropriate  Engagement in Group:  Resistant  Modes of Intervention:  Education  Summary of Progress/Problems: The patient described his day as having been "in between". In terms of the theme of the day, his relapse prevention will involve "cutting back on my drinking".   Archie Balboa S 04/04/2015, 8:56 PM

## 2015-04-04 NOTE — BHH Group Notes (Signed)
[facility] LCSW Aftercare Discharge Planning Group Note   04/04/2015 1:16 PM  Participation Quality: Active  Mood/Affect:  Appropriate  Depression Rating:    Anxiety Rating:    Thoughts of Suicide:  Yes Will you contract for safety?   Yes  Current AVH:  Yes  Plan for Discharge/Comments:  Pt denied all symptoms to the doctor this morning however, in group today pt explained that "the feelings are coming back". When asked what he meant by that the pt expressed feeling of suicide and depression.   Transportation Means:   Supports:  Georga Kaufmann

## 2015-04-04 NOTE — BHH Suicide Risk Assessment (Signed)
[place] INPATIENT:  Family/Significant Other Suicide Prevention Education  Suicide Prevention Education:  Patient Refusal for Family/Significant Other Suicide Prevention Education: The patient John [name] has refused to provide written consent for family/significant other to be provided Family/Significant Other Suicide Prevention Education during admission and/or prior to discharge.  Physician notified.  Roque Lias B 04/04/2015, 3:24 PM

## 2015-04-04 NOTE — BHH Group Notes (Signed)
[place] LCSW Group Therapy   04/04/2015 1:38 PM  Type of Therapy: Group Therapy  Participation Level:  Active  Participation Quality:  Attentive  Affect:  Flat  Cognitive:  Oriented  Insight:  Limited  Engagement in Therapy:  Engaged  Modes of Intervention:  Discussion and Socialization  Summary of Progress/Problems: Chaplain was here to lead a group on themes of hope and/or courage.  Pt remained quiet and dozed off at times during group. Pt related to a peer that was talking about not knowing what to do and wanting to be told what she should do. He spoke up and said "Yes, that's exactly how I feel because I don't know what to do". Soon after he made this comment pt left group. Did not return.  Georga Kaufmann 04/04/2015 1:38 PM

## 2015-04-04 NOTE — Progress Notes (Signed)
DAR NOTE: Patient presents with anxious affect and depressed mood.  Denies pain and auditory hallucinations. Patient reports hearing voices telling him to kill himself."  Patient unable to contracts for safety at this time.  Patient restricted to the unit for safety and observation.   Maintained on routine safety checks.  Medications given as prescribed.  Support and encouragement offered as needed.  Attended group and participated.  Patient was verbally abusive to staff, pointing and yelling at staff.  Patient was redirected and Haldol and Cogentin given with good effect.  No further issues noted.  Patient is safe on the unit.
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
## 2015-04-05 NOTE — Progress Notes (Signed)
Pt visible in the milieu. Interacting appropriately with staff and peers. Attending group.  Pt acknowledged AH command in nature.  Pt stated "they are not bad right now".  Pt contracts for safety.  Pt reports the AH come and go.  Support and encouragement provided. Pt receptive. Needs assessed. Pt denied. Pt denied HI and VH.  Fifteen minute checks continue for patient safety.  Pt safe on unit.
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
## 2015-04-05 NOTE — Progress Notes (Signed)
DAR NOTE: Patient presents with anxious affect and depressed mood.  Reports hearing voices telling him "to kill himself and other people" but contracts for safety.  Describes energy level as normal and concentration as poor.  Rates depression at 3, hopelessness at 4, and anxiety at 3.  Maintained on routine safety checks.  Medications given as prescribed.  Support and encouragement offered as needed.  Attended group and participated.  States goal for today is "my goal."  Patient observed socializing with peers in the dayroom.  Offered no complaint.

## 2015-04-05 NOTE — [facility] Group Notes (Signed)
[place] Group Notes:  (Clinical Social Work)  04/05/2015  9:00-9:45AM  Summary of Progress/Problems:   Today's process group involved patients discussing their feelings related to being hospitalized, as well as how they can use their present feelings to create a goal for the day. The patient expressed his/her primary feeling about being hospitalized is "half and half" because he is happy and wants to stay to get the help stopping his voices.  On the other hand, he wants to hurt everyone and leave.  He stated, "the voices overpower the medicines."  He reported feelings of fear that he will not get the help he needs to get rid of the voices.  Type of Therapy:  Group Therapy - Process  Participation Level:  Active  Participation Quality:  Attentive and Sharing  Affect:  Depressed and Flat  Cognitive:  Hallucinating  Insight:  Developing/Improving  Engagement in Therapy:  Engaged  Modes of Intervention:  Exploration, Discussion  Selmer [place], LCSW 04/05/2015, 1:05 PM

## 2015-04-05 NOTE — B[facility] Group Notes (Signed)
[place] Group Notes:  (Nursing/MHT/Case Management/Adjunct)  Date:  04/05/2015  Time:  12:35 PM  Type of Therapy:  Nurse Education  Participation Level:  Active  Participation Quality:  Appropriate and Attentive  Affect:  Appropriate  Cognitive:  Alert and Appropriate  Insight:  Appropriate and Good  Engagement in Group:  Resistant  Modes of Intervention:  Activity, Discussion and Education  Summary of Progress/Problems: Topic was on healthy coping skills.  Discussed the importance of surrounding oneself with a good support system. Support systems are there to motivate, encourage, and assist with learning new coping skills that leads to a healthy lifestyle.  Patient was very irritable and resistant to topic of discussion.  John [name] 04/05/2015, 12:35 PM

## 2015-04-06 LAB — VALPROIC ACID LEVEL: Valproic Acid Lvl: 59 ug/mL (ref 50.0–100.0)

## 2015-04-06 NOTE — B[facility] Group Notes (Signed)
[place] Group Notes:  (Nursing/MHT/Case Management/Adjunct)  Date:  04/06/2015  Time:  4:08 PM   Type of Therapy:  Psychoeducational Skills  Participation Level:  Did Not Attend  Participation Quality:  DID NOT ATTEND  Affect:  DID NOT ATTEND  Cognitive:  DID NOT ATTEND  Insight:  None  Engagement in Group:  DID NOT ATTEND  Modes of Intervention:  DID NOT ATTEND  Summary of Progress/Problems: Pt did not attend patient self inventory group.  Wolfgang [place] 04/06/2015, 4:08 PM
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
## 2015-04-06 NOTE — BHH Group Notes (Signed)
[place] Group Notes: (Clinical Social Work)   04/06/2015      Type of Therapy:  Group Therapy   Participation Level:  Did Not Attend despite MHT prompting   Selmer Dominion, LCSW 04/06/2015, 1:00 PM

## 2015-04-06 NOTE — Progress Notes (Signed)
Patient ID: John [name], male   DOB: March 15, 1971, 45 y.o.   MRN: [id number] D: "I know if I don't take my medication I start acting weird". Patient stated he is doing well and had a good day. Pt endorses AVH and self harm thoughts. Pt contracts to come to staff before acting on harmful thoughts. Pt denies HI and pain. Pt attended evening wrap up group and engaged in discussion. Pt denies any needs or concerns. Cooperative with assessment.    A: Met with pt 1:1. Medications administered as prescribed. Writer encouraged pt to discuss feelings.   R: Patient is safe on the unit and complaint with medications.

## 2015-04-06 NOTE — Progress Notes (Signed)
[place] Group Notes:  (Nursing/MHT/Case Management/Adjunct)  Date:  04/06/2015  Time:  8:53 PM  Type of Therapy:  Psychoeducational Skills  Participation Level:  Minimal  Participation Quality:  Resistant  Affect:  Blunted  Cognitive:  Lacking  Insight:  Lacking  Engagement in Group:  Lacking  Modes of Intervention:  Education  Summary of Progress/Problems: Patient stated that he had a good day but did not offer any additional details. In terms of the theme for the day, his support system will consist of his aunt.   Archie Balboa S 04/06/2015, 8:53 PM

## 2015-04-06 NOTE — Plan of Care (Signed)
Problem: Diagnosis: Increased Risk For Suicide Attempt Goal: STG-Patient Will Attend All Groups On The Unit Outcome: Progressing Pt attended and engaged in evening wrap up group     

## 2015-04-06 NOTE — Progress Notes (Signed)
D: Pt is very irritable, isolative and withdrawn to room, making minimal eye contact. Pt continues to endorse command AH to harm self; Pt was able to verbally contract for safety. He states, "Yes! Yes!! I will let you know when I feel like harming myself." Pt also endorses severe depression and anxiety. Pt denies pain. Pt remained nonviolent. A: Medications offered as prescribed.  Support, encouragement, and safe environment provided.  15-minute safety checks continue. R: Pt was med compliant.  Pt did not attend wrap up group meeting. Safety checks continue.

## 2015-04-06 NOTE — Progress Notes (Signed)
DAR NOTE: Pt present with flat affect and depressed mood in the unit. Pt has been isolating himself and has been bed most of the time. Pt denies physical pain, took all his meds as scheduled. As per self inventory, pt had a good night sleep, good appetite, normal energy, and good concentration. Pt rate depression at 3, hopeless ness at 6 and anxiety at 6. "Pt goal for today is to go home." Pt's safety ensured with 15 minute and environmental checks. Pt currently denies SI/HI and A/V hallucinations. Pt verbally agrees to seek staff if SI/HI or A/VH occurs and to consult with staff before acting on these thoughts. Will continue POC.

## 2015-04-07 MED ORDER — QUETIAPINE FUMARATE 100 MG PO TABS
500.0000 mg | ORAL_TABLET | Freq: Every day | ORAL | Status: DC
  Administered 2015-04-07 – 2015-04-08 (×2): 500 mg via ORAL
  Filled 2015-04-07 (×2): qty 2.5
  Filled 2015-04-07: qty 3
  Filled 2015-04-07: qty 2.5

## 2015-04-07 NOTE — BHH Group Notes (Signed)
[place] Group Notes:  (Counselor/Nursing/MHT/Case Management/Adjunct)  04/07/2015 1:15PM  Type of Therapy:  Group Therapy  Participation Level:  Active  Participation Quality:  Appropriate  Affect:  Flat  Cognitive:  Oriented  Insight:  Improving  Engagement in Group:  Limited  Engagement in Therapy:  Limited  Modes of Intervention:  Discussion, Exploration and Socialization  Summary of Progress/Problems: The topic for group was balance in life.  Pt participated in the discussion about when their life was in balance and out of balance and how this feels.  Pt discussed ways to get back in balance and short term goals they can work on to get where they want to be. Shared initially, slept for most of the rest of group.  "I'm unbalanced today.  The voices have a lot to do with it.  They are not the only thing, but the main thing."  Went on to state that the not being able to handle the voices, and not having any one who can help him with them leads to a lonely and dark place.  "I just need to be able to sleep in my room.  That generally helps."   Trish Mage 04/07/2015 3:11 PM

## 2015-04-07 NOTE — Plan of Care (Signed)
Problem: Diagnosis: Increased Risk For Suicide Attempt Goal: STG-Patient Will Report Suicidal Feelings to Staff Outcome: Progressing Pt endorses self harm thoughts, contracts to come to staff before acting on harmful thoughts.
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
## 2015-04-07 NOTE — Progress Notes (Signed)
D: Patient states he is hearing voices and is having active SI.  Patient states, "I was having a nightmare about Freddy when you woke me up for meds."  He continues to have command hallucinations to harm himself.  Patient is able to contract for safety.  He states is sleeping fair and eating well.  His energy is low.  He presents with flat, blunted affect.  He rates his depression as an 8; hopelessness as a 6; anxiety as a 7.  Patient does not feel ready for discharge. A: Continue to monitor medication management and MD orders.  Safety checks completed every 15 minutes per protocol.  Offer support and encouragement as needed. R: Patient is receptive to staff; his behavior is appropriate.

## 2015-04-07 NOTE — Progress Notes (Signed)
Adult Psychoeducational Group Note  Date:  04/07/2015 Time:  8:40 PM  Group Topic/Focus:  Wrap-Up Group:   The focus of this group is to help patients review their daily goal of treatment and discuss progress on daily workbooks.  Participation Level:  Active  Participation Quality:  Appropriate and Attentive  Affect:  Appropriate  Cognitive:  Appropriate  Insight: Lacking  Engagement in Group:  Lacking  Modes of Intervention:  Discussion  Additional Comments:  Pt showed up to group late. Pt mentioned he had a good day and he was Thankful to wake up.   Jerline Pain 04/07/2015, 8:40 PM

## 2015-04-07 NOTE — Progress Notes (Signed)
Patient ID: John [name], male   DOB: 08/19/1970, 44 y.o.   MRN: [id number] D: Pt observed interacting with peers reports he had a good day. Pt endorses AVH and self harm thoughts. Pt contracts to come to staff before acting on harmful thoughts. Pt denies HI and pain. Pt attended evening wrap up group. Pt denies any needs or concerns. Cooperative with assessment.    A: Met with pt 1:1. Medications administered as prescribed. Writer encouraged pt to discuss feelings.   R: Patient is safe on the unit and complaint with medications. 

## 2015-04-07 NOTE — BHH Group Notes (Signed)
[facility] LCSW Aftercare Discharge Planning Group Note   04/07/2015 3:07 PM  Participation Quality:  Minimal  Mood/Affect:  Flat  Depression Rating:  8  Anxiety Rating:  8  Thoughts of Suicide:  No Will you contract for safety?   NA  Current AVH:  Yes  Plan for Discharge/Comments:  "Why didn't the Dr release me over the weekend."  Tried to help him understand that the Dr will not release if symptomatic.  Initially denied that he had been SI over the weekend, only voices.  But then stated voices make him SI, and he tried to cut himself again.  Helpless, hopeless.  "They never go away, and if they do, it's only temporary."  Transportation Means:   Supports:  [place], [place]
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
## 2015-04-08 NOTE — BHH Group Notes (Signed)
[place] Group Notes:  (Nursing/MHT/Case Management/Adjunct)  Date:  04/08/2015  Time:  6:51 PM  Type of Therapy:  Nurse Education  Participation Level:  Minimal  Participation Quality:  Sharing  Affect:  Flat  Cognitive:  Alert  Insight:  Limited  Engagement in Group:  Developing/Improving  Modes of Intervention:  Discussion and Education  Summary of Progress/Problems:  Group topic was Recovery. Discussed coping skills, goal setting and importance of sleep.   John [name] 04/08/2015, 6:51 PM

## 2015-04-08 NOTE — Progress Notes (Signed)
D- Patient is observed interacting well with peers in the 29.  He endorses passive SI without a plan.  Denies HI. Patient reports command hallucinations telling him to harm himself and others. On patient self-inventory, patient rates his depression "6", feelings of hopelessness "3", and anxiety "4" with 10 being the worst. No complaints. A- Scheduled medications administered to patient, per MD orders. Support and encouragement provided.  Routine safety checks conducted every 15 minutes.  Patient informed to notify staff with problems or concerns. R- No adverse drug reactions noted. Patient contracts for safety at this time. Patient compliant with medications and treatment plan. Patient receptive, calm, and cooperative. Patient remains safe at this time.

## 2015-04-08 NOTE — Progress Notes (Signed)
Adult Psychoeducational Group Note  Date:  04/08/2015 Time:  8:15 PM  Group Topic/Focus:  Wrap-Up Group:   The focus of this group is to help patients review their daily goal of treatment and discuss progress on daily workbooks.  Participation Level:  Did Not Attend    [name] 04/08/2015, 8:48 PM

## 2015-04-08 NOTE — Tx Team (Signed)
Interdisciplinary Treatment Plan Update (Adult)  Date:  04/08/2015 Time Reviewed:  2:38 PM  Progress in Treatment: Attending groups: Yes. Participating in groups: Yes. Taking medication as prescribed:  Yes. Tolerating medication:  Yes. Family/Significant othe contact made: No Patient understands diagnosis:  Yes, as evidenced by seeking help with SI , HI and AH  Discussing patient identified problems/goals with staff:  Yes, see initial care plan. Medical problems stabilized or resolved:  Yes Denies suicidal/homicidal ideation:No  Endorses SI and was on 1:1 last night due to self-harming behavior Issues/concerns per patient self-inventory: No. Other:  New problem(s) identified:   Discharge Plan or Barriers: See below   Reason for Continuation of Hospitalization: Depression Hallucinations Medication stabilization Suicidal ideation  Comments: John [name] is a 45 year old man, known to [name] from prior admissions to unit.He has been diagnosed with Bipolar Disorder in the past, and has a history of Alcohol Dependence .He reports worsening depression, sadness, anxiety, and auditory hallucinations. Of note, he states has not been taking his psychiatric medications for at least three months. He stated his medications were helping. He does not remember name, but states " they were the same ones I got prescribed last time I was here" ( As per chart notes, these were Seroquel and Depakote) He presents depressed, sad, and has some restlessness and distal tremors.He is facing significant and chronic psychosocial stressors, to include homelessness , chronic disease, unemployment, no source of income. His alcohol level on admission was 284 but patient has reported frequency of twice a months. However, he does not appear to be truthful regarding the extent of his alcohol usage. Patient's urine drug screen has been consistently positive for marijuana during his last several visits to the hospital.  Librium, Depakote, Neurontin, Remeron, Seroquel trial  04/08/15:  Pt attempted to cut self again here on the unit . Pt continues to be depressed and is psychotic, although progressing Will continue Seroquel 500 mg po qhs for psychosis. Will continue Depakote ER 1000 mg po qhs for mood sx. Depakote level 04/06/15- 59 . Will continue Remeron 7.5 mg po qhs for sleep. Will monitor PTSD sx - refer for CBT. Will continue CIWA/Librium protocol for alcohol withdrawal sx. Will continue Gabapentin 300 mg po bid for anxiety/irritability.  Estimated length of stay: 3-4 days  New goal(s):  Review of initial/current patient goals per problem list:  1. Goal(s): Patient will participate in aftercare plan  Met:Yes  Target date: at discharge  As evidenced by: Patient will participate within aftercare plan AEB aftercare provider and housing plan at discharge being identified.  04/02/15: Pt will return home with Aunt and follow-up outpt with [facility].  2. Goal (s): Patient will exhibit decreased depressive symptoms and suicidal ideations.  Met: No   Target date: at discharge  As evidenced by: Patient will utilize self rating of depression at 3 or below and demonstrate decreased signs of depression or be deemed stable for discharge by MD. 04/02/15: Pt rates depression as 8/10 and reports passive SI. 04/08/15:  Continues to rate depression an 8, and continues to c/o SI.  3. Goal(s): Patient will demonstrate decreased signs and symptoms of anxiety.  Met: No   Target date: at discharge  As evidenced by: Patient will utilize self rating of anxiety at 3 or below and demonstrated decreased signs of anxiety, or be deemed stable for discharge by MD  04/02/15: Pt rates anxiety as 7/10. 04/08/15:  Continues to rate anxiety a 7.  4. Goal(s): Patient will demonstrate  decreased signs of psychosis.  Met: No   Target date:at discharge  As evidenced by: Patient will demonstrate  decreased signs of psychosis as evidenced by a reduction in AVH, paranoia, and/or delusions.   04/02/15: Pt endorses auditory hallucinations.   04/08/15:  Continues to endorse same  Attendees: Patient:  04/08/2015 2:38 PM  Family:   04/08/2015 2:38 PM  Physician:  Dr. Ursula Alert, MD 04/08/2015 2:38 PM  Nursing: Manuella Ghazi, RN 04/08/2015 2:38 PM  Case Manager:  Roque Lias, LCSW 04/08/2015 2:38 PM  Counselor:  Matthew Saras, MSW Intern 04/08/2015 2:38 PM  Other:   04/08/2015 2:38 PM  Other:   04/08/2015 2:38 PM  Other:   04/08/2015 2:38 PM  Other:  04/08/2015 2:38 PM  Other:    Other:    Other:    Other:    Other:    Other:      Scribe for Treatment Team:   Georga Kaufmann, MSW Intern 04/08/2015 2:38 PM

## 2015-04-08 NOTE — BHH Group Notes (Signed)
[place] LCSW Group Therapy  04/08/2015 2:32 PM   Type of Therapy:  Group Therapy  Participation Level:  Active  Participation Quality:  Attentive  Affect:  Appropriate  Cognitive:  Appropriate  Insight:  Improving  Engagement in Therapy:  Engaged  Modes of Intervention:  Clarification, Education, Exploration and Socialization  Summary of Progress/Problems: Today's group focused on relapse prevention.  We defined the term, and then brainstormed on ways to prevent relapse.  Per usual, slept for much of the time.  When called on, he opened his eyes right away, and asked for the question to be repeated.  Talked about his bi yearly family reunion that will next happen on July 4 at [facility].  "We have a wonderful time, and I always look forward to going.  Even though we may not get along the rest of the year, everyone can put aside their differences, and get along for one day.  Roque Lias B 04/08/2015 , 2:32 PM

## 2015-04-08 NOTE — Progress Notes (Signed)
Patient ID: John [name], male   DOB: 1970-04-13, 45 y.o.   MRN: [id number] D: Pt observed interacting with peers reports he had a good day. Pt endorses AVH and self harm thoughts. Pt contracts to come to staff before acting on harmful thoughts. Pt denies HI and pain. Pt attended evening wrap up group. Pt denies any needs or concerns. Cooperative with assessment.    A: Met with pt 1:1. Medications administered as prescribed. Writer encouraged pt to discuss feelings.   R: Patient is safe on the unit and complaint with medications.

## 2015-04-09 MED ORDER — GABAPENTIN 100 MG PO CAPS: 200.0000 mg | ORAL_CAPSULE | Freq: Two times a day (BID) | ORAL | Status: DC

## 2015-04-09 MED ORDER — DIVALPROEX SODIUM ER 500 MG PO TB24: 1000.0000 mg | ORAL_TABLET | Freq: Every day | ORAL | Status: DC

## 2015-04-09 MED ORDER — GABAPENTIN 300 MG PO CAPS: 300.0000 mg | ORAL_CAPSULE | Freq: Two times a day (BID) | ORAL | Status: DC

## 2015-04-09 MED ORDER — QUETIAPINE FUMARATE 100 MG PO TABS: 500.0000 mg | ORAL_TABLET | Freq: Every day | ORAL | Status: DC

## 2015-04-09 NOTE — Tx Team (Signed)
Interdisciplinary Treatment Plan Update (Adult)  Date:  04/09/2015 Time Reviewed:  10:43 AM  Progress in Treatment: Attending groups: Yes. Participating in groups: Yes. Taking medication as prescribed:  Yes. Tolerating medication:  Yes. Family/Significant othe contact made: No Patient understands diagnosis:  Yes, as evidenced by seeking help with SI , HI and AH  Discussing patient identified problems/goals with staff:  Yes, see initial care plan. Medical problems stabilized or resolved:  Yes Denies suicidal/homicidal ideation:No  Endorses SI and was on 1:1 last night due to self-harming behavior Issues/concerns per patient self-inventory: No. Other:  New problem(s) identified:   Discharge Plan or Barriers: See below   Reason for Continuation of Hospitalization:   Comments: John [name] is a 45 year old man, known to [name] from prior admissions to unit.He has been diagnosed with Bipolar Disorder in the past, and has a history of Alcohol Dependence .He reports worsening depression, sadness, anxiety, and auditory hallucinations. Of note, he states has not been taking his psychiatric medications for at least three months. He stated his medications were helping. He does not remember name, but states " they were the same ones I got prescribed last time I was here" ( As per chart notes, these were Seroquel and Depakote) He presents depressed, sad, and has some restlessness and distal tremors.He is facing significant and chronic psychosocial stressors, to include homelessness , chronic disease, unemployment, no source of income. His alcohol level on admission was 284 but patient has reported frequency of twice a months. However, he does not appear to be truthful regarding the extent of his alcohol usage. Patient's urine drug screen has been consistently positive for marijuana during his last several visits to the hospital. Librium, Depakote, Neurontin, Remeron, Seroquel trial  04/08/15:  Pt  attempted to cut self again here on the unit . Pt continues to be depressed and is psychotic, although progressing Will continue Seroquel 500 mg po qhs for psychosis. Will continue Depakote ER 1000 mg po qhs for mood sx. Depakote level 04/06/15- 59 . Will continue Remeron 7.5 mg po qhs for sleep. Will monitor PTSD sx - refer for CBT. Will continue CIWA/Librium protocol for alcohol withdrawal sx. Will continue Gabapentin 300 mg po bid for anxiety/irritability.  Estimated length of stay: D/C today  New goal(s):  Review of initial/current patient goals per problem list:  1. Goal(s): Patient will participate in aftercare plan  Met:Yes  Target date: at discharge  As evidenced by: Patient will participate within aftercare plan AEB aftercare provider and housing plan at discharge being identified.  04/02/15: Pt will return home with Aunt and follow-up outpt with [facility].  2. Goal (s): Patient will exhibit decreased depressive symptoms and suicidal ideations.  Met: Yes   Target date: at discharge  As evidenced by: Patient will utilize self rating of depression at 3 or below and demonstrate decreased signs of depression or be deemed stable for discharge by MD. 04/02/15: Pt rates depression as 8/10 and reports passive SI. 04/08/15:  Continues to rate depression an 8, and continues to c/o SI. 04/09/15:  Denies depression today  3. Goal(s): Patient will demonstrate decreased signs and symptoms of anxiety.  Met: Yes   Target date: at discharge  As evidenced by: Patient will utilize self rating of anxiety at 3 or below and demonstrated decreased signs of anxiety, or be deemed stable for discharge by MD  04/02/15: Pt rates anxiety as 7/10. 04/08/15:  Continues to rate anxiety a 7. 04/09/15:  Denies anxiety today  4. Goal(s): Patient will demonstrate decreased signs of psychosis.  Met: No   Target date:at discharge  As evidenced by: Patient will demonstrate  decreased signs of psychosis as evidenced by a reduction in AVH, paranoia, and/or delusions.   04/02/15: Pt endorses auditory hallucinations.   04/08/15:  Continues to endorse same 04/09/15:  Denies all symptoms today.  Attendees: Patient:  04/09/2015 10:43 AM  Family:   04/09/2015 10:43 AM  Physician:  Dr. Ursula Alert, MD 04/09/2015 10:43 AM  Nursing: Manuella Ghazi, RN 04/09/2015 10:43 AM  Case Manager:  Roque Lias, LCSW 04/09/2015 10:43 AM  Counselor:  Matthew Saras, MSW Intern 04/09/2015 10:43 AM  Other:   04/09/2015 10:43 AM  Other:   04/09/2015 10:43 AM  Other:   04/09/2015 10:43 AM  Other:  04/09/2015 10:43 AM  Other:    Other:    Other:    Other:    Other:    Other:      Scribe for Treatment Team:   Georga Kaufmann, MSW Intern 04/09/2015 10:43 AM

## 2015-04-09 NOTE — Progress Notes (Addendum)
  [facility] Adult Case Management Discharge Plan :  Will you be returning to the same living situation after discharge:  Yes,  home with aunt At discharge, do you have transportation home?: Yes,  bus pass Do you have the ability to pay for your medications: Yes,  mental health  Release of information consent forms completed and in the chart;  Patient's signature needed at discharge.  Patient to Follow up at: Follow-up Information    Follow up with [facility].   Why:  Go to your next scheduled appointment, or, if over 3 weeks out, go to the walk-in clinic M-F between 8 and 11AM for your hospital follow upappointment   Contact information:   [place] [phone]      Next level of care provider has access to [place] and Suicide Prevention discussed: Yes,  yes  Have you used any form of tobacco in the last 30 days? (Cigarettes, Smokeless Tobacco, Cigars, and/or Pipes): Yes  Has patient been referred to the Quitline?: Patient refused referral  Patient has been referred for addiction treatment: Yes  John [name] B 04/09/2015, 10:46 AM
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
## 2015-04-09 NOTE — Progress Notes (Signed)
Patient discharged home with prescriptions and samples. Patient was stable and appreciative at that time. All papers and prescriptions were given and valuables returned. Verbal understanding expressed. Denies SI/HI and A/VH. Patient given opportunity to express concerns and ask questions.
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
## 2015-07-24 ENCOUNTER — Encounter (INDEPENDENT_AMBULATORY_CARE_PROVIDER_SITE_OTHER): Payer: Self-pay | Admitting: *Deleted

## 2015-07-24 VITALS — BP 126/84 | HR 91 | Temp 98.2°F | Resp 16 | Wt 180.5 lb

## 2015-07-24 DIAGNOSIS — Z006 Encounter for examination for normal comparison and control in clinical research program: Secondary | ICD-10-CM

## 2015-07-24 LAB — COMPREHENSIVE METABOLIC PANEL
ALBUMIN: 4 g/dL (ref 3.6–5.1)
ALT: 14 U/L (ref 9–46)
AST: 17 U/L (ref 10–40)
Alkaline Phosphatase: 100 U/L (ref 40–115)
BILIRUBIN TOTAL: 0.3 mg/dL (ref 0.2–1.2)
BUN: 13 mg/dL (ref 7–25)
CO2: 22 mmol/L (ref 20–31)
CREATININE: 1.01 mg/dL (ref 0.60–1.35)
Calcium: 8.5 mg/dL — ABNORMAL LOW (ref 8.6–10.3)
Chloride: 106 mmol/L (ref 98–110)
Glucose, Bld: 71 mg/dL (ref 65–99)
Potassium: 3.6 mmol/L (ref 3.5–5.3)
SODIUM: 141 mmol/L (ref 135–146)
TOTAL PROTEIN: 6.6 g/dL (ref 6.1–8.1)

## 2015-07-24 LAB — POCT URINALYSIS DIPSTICK
Blood, UA: NEGATIVE
Glucose, UA: NEGATIVE
Ketones, UA: NEGATIVE
Leukocytes, UA: NEGATIVE
Nitrite, UA: NEGATIVE
PH UA: 5.5
PROTEIN UA: 30
UROBILINOGEN UA: 0.2

## 2015-07-24 LAB — LIPID PANEL
CHOL/HDL RATIO: 1.6 ratio (ref <=5.0)
Cholesterol: 142 mg/dL (ref 125–200)
HDL: 89 mg/dL (ref >=40)
LDL Cholesterol: 38 mg/dL (ref <130)
Triglycerides: 73 mg/dL (ref <150)
VLDL: 15 mg/dL (ref <30)

## 2015-07-24 NOTE — Progress Notes (Signed)
John [name] is here for the month 60 visit for START. He denies any new problems, but he was in the hospital in January for depression. He has not been back to [employer] though. His aunt was with him and she helps him with his medications and appointments. I did refer him to the [facility] for general medicine issues and follow-up, particularly for his respiratory problems. He has an appt. With Dr. Baxter Flattery next month. This was the last time we can give him his HIV meds from study. After the end of the year he will come off of the study and will need to be scheduled for regular labs and appts.

## 2015-07-25 LAB — HIV-1 RNA QUANT-NO REFLEX-BLD

## 2015-07-25 LAB — CD4/CD8 (T-HELPER/T-SUPPRESSOR CELL)
CD4%: 39.5
CD4: 948

## 2015-07-30 ENCOUNTER — Encounter: Payer: Self-pay | Admitting: Internal Medicine

## 2015-08-27 ENCOUNTER — Ambulatory Visit (INDEPENDENT_AMBULATORY_CARE_PROVIDER_SITE_OTHER): Payer: Self-pay | Admitting: Internal Medicine

## 2015-08-27 ENCOUNTER — Encounter: Payer: Self-pay | Admitting: Internal Medicine

## 2015-08-27 VITALS — BP 113/77 | HR 83 | Temp 98.6°F | Ht 68.5 in | Wt 176.0 lb

## 2015-08-27 DIAGNOSIS — M1000 Idiopathic gout, unspecified site: Secondary | ICD-10-CM

## 2015-08-27 DIAGNOSIS — R768 Other specified abnormal immunological findings in serum: Secondary | ICD-10-CM

## 2015-08-27 DIAGNOSIS — R894 Abnormal immunological findings in specimens from other organs, systems and tissues: Secondary | ICD-10-CM

## 2015-08-27 DIAGNOSIS — B20 Human immunodeficiency virus [HIV] disease: Secondary | ICD-10-CM

## 2015-08-27 DIAGNOSIS — R7689 Other specified abnormal immunological findings in serum: Secondary | ICD-10-CM

## 2015-08-27 MED ORDER — HYDROCODONE-ACETAMINOPHEN 5-325 MG PO TABS: 1.0000 | ORAL_TABLET | Freq: Four times a day (QID) | ORAL | Status: DC | PRN

## 2015-08-27 MED ORDER — ALLOPURINOL 100 MG PO TABS: 100.0000 mg | ORAL_TABLET | Freq: Every day | ORAL | Status: DC

## 2015-08-27 MED ORDER — PREDNISONE 10 MG PO TABS: 40.0000 mg | ORAL_TABLET | Freq: Every day | ORAL | Status: DC

## 2015-08-27 MED FILL — HYDROCODON-APAP 5-325: 5-325 | 8 days supply | Qty: 30 | Fill #0
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
## 2015-08-28 LAB — RPR

## 2015-10-03 ENCOUNTER — Encounter (HOSPITAL_COMMUNITY): Payer: Self-pay | Admitting: Emergency Medicine

## 2015-10-03 ENCOUNTER — Emergency Department (HOSPITAL_COMMUNITY)
Admission: EM | Admit: 2015-10-03 | Discharge: 2015-10-03 | Disposition: A | Discharge status: Home / Self Care | Payer: Federal, State, Local not specified - Other | Attending: Emergency Medicine | Admitting: Emergency Medicine

## 2015-10-03 DIAGNOSIS — Z21 Asymptomatic human immunodeficiency virus [HIV] infection status: Secondary | ICD-10-CM | POA: Insufficient documentation

## 2015-10-03 DIAGNOSIS — M25531 Pain in right wrist: Secondary | ICD-10-CM | POA: Insufficient documentation

## 2015-10-03 DIAGNOSIS — M1000 Idiopathic gout, unspecified site: Secondary | ICD-10-CM

## 2015-10-03 DIAGNOSIS — F319 Bipolar disorder, unspecified: Secondary | ICD-10-CM | POA: Insufficient documentation

## 2015-10-03 DIAGNOSIS — F209 Schizophrenia, unspecified: Secondary | ICD-10-CM | POA: Insufficient documentation

## 2015-10-03 DIAGNOSIS — Z79899 Other long term (current) drug therapy: Secondary | ICD-10-CM | POA: Insufficient documentation

## 2015-10-03 DIAGNOSIS — J45909 Unspecified asthma, uncomplicated: Secondary | ICD-10-CM | POA: Insufficient documentation

## 2015-10-03 DIAGNOSIS — I10 Essential (primary) hypertension: Secondary | ICD-10-CM | POA: Insufficient documentation

## 2015-10-03 DIAGNOSIS — F17210 Nicotine dependence, cigarettes, uncomplicated: Secondary | ICD-10-CM | POA: Insufficient documentation

## 2015-10-03 MED ORDER — COLCHICINE 0.6 MG PO TABS: 0.6000 mg | ORAL_TABLET | Freq: Every day | ORAL | Status: DC

## 2015-10-03 MED ORDER — ALLOPURINOL 100 MG PO TABS: 100.0000 mg | ORAL_TABLET | Freq: Every day | ORAL | Status: DC

## 2015-10-03 MED ORDER — COLCHICINE 0.6 MG PO TABS
0.6000 mg | ORAL_TABLET | Freq: Once | ORAL | Status: AC
  Administered 2015-10-03: 0.6 mg via ORAL
  Filled 2015-10-03: qty 1

## 2015-10-03 MED ORDER — HYDROCODONE-ACETAMINOPHEN 5-325 MG PO TABS
1.0000 | ORAL_TABLET | Freq: Once | ORAL | Status: AC
  Administered 2015-10-03: 1 via ORAL
  Filled 2015-10-03: qty 1

## 2015-10-03 MED ORDER — HYDROCODONE-ACETAMINOPHEN 5-325 MG PO TABS: 1.0000 | ORAL_TABLET | Freq: Four times a day (QID) | ORAL | Status: DC | PRN

## 2015-10-03 NOTE — Discharge Instructions (Signed)
You received a prescription for allopurinol, this is a medication to help prevent future gouty attacks. He did not need to start this now however once this acute attack is over you need to start this medication to prevent further attacks. We still need to make dietary modifications to also reduce the chance of repeat gout flares.

## 2015-10-03 NOTE — ED Notes (Signed)
Discharge instructions, follow up care, and rx x3 reviewed with patient. Patient verbalized understanding. 
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
## 2015-10-03 NOTE — ED Provider Notes (Signed)
CSN: [id number]     Arrival date & time 10/03/15  1005 History   First MD Initiated Contact with Patient 10/03/15 1013     Chief Complaint  Patient presents with  . Wrist Pain  . Hand Pain     (Consider location/radiation/quality/duration/timing/severity/associated sxs/prior Treatment) Patient is a 45 y.o. male presenting with wrist pain.  Wrist Pain This is a recurrent problem. The current episode started more than 2 days ago. The problem occurs constantly. The problem has been gradually worsening. Nothing aggravates the symptoms. Nothing relieves the symptoms. He has tried nothing for the symptoms. The treatment provided no relief.    Past Medical History  Diagnosis Date  . Asthma   . HIV (human immunodeficiency virus infection) ([place])     "dx'd ~ 2 yr ago" (09/29/2012)  . Mental disorder   . Anxiety   . Depression   . Chronic low back pain   . Alcohol abuse   . HIV disease ([place])   . Hypertension   . Gout   . Alcoholism ([place])   . Homelessness   . Cocaine abuse   . Marijuana abuse   . HIV (human immunodeficiency virus infection) ([place])   . Asthma   . Bipolar disorder ([place])   . Schizophrenia ([place])   . Gout    Past Surgical History  Procedure Laterality Date  . Skin graft full thickness leg Left ?    POSTERIOR LEFT LEG  AFTER DOG BITES   Family History  Problem Relation Age of Onset  . Alcoholism Mother   . Depression Mother   . Alcoholism Brother    Social History  Substance Use Topics  . Smoking status: Current Every Day Smoker -- 0.50 packs/day for 27 years    Types: Cigarettes  . Smokeless tobacco: Never Used     Comment: cutting back  . Alcohol Use: Yes     Comment: quit 06/2015    Review of Systems  Musculoskeletal: Positive for joint swelling (right wrist) and arthralgias (right wrist). Negative for back pain and gait problem.  All other systems reviewed and are negative.     Allergies  Shellfish allergy and Shellfish allergy  Home Medications    Prior to Admission medications   Medication Sig Start Date End Date Taking? Authorizing Provider  albuterol (PROVENTIL HFA;VENTOLIN HFA) 108 (90 BASE) MCG/ACT inhaler Inhale 2 puffs into the lungs every 4 (four) hours as needed for wheezing or shortness of breath. 11/17/14  Yes Hildred Priest, MD  Darunavir Ethanolate (PREZISTA) 800 MG tablet Take 1 tablet (800 mg total) by mouth daily with breakfast. For HIV infection 04/26/14  Yes Encarnacion Slates, NP  divalproex (DEPAKOTE ER) 500 MG 24 hr tablet Take 2 tablets (1,000 mg total) by mouth at bedtime. 04/09/15  Yes Kerrie Buffalo, NP  elvitegravir-cobicistat-emtricitabine-tenofovir (STRIBILD) 150-150-200-300 MG TABS tablet Take 1 tablet by mouth daily with breakfast. For HIV infection 04/26/14  Yes Encarnacion Slates, NP  QUEtiapine (SEROQUEL) 100 MG tablet Take 5 tablets (500 mg total) by mouth at bedtime. 04/09/15  Yes Kerrie Buffalo, NP  allopurinol (ZYLOPRIM) 100 MG tablet Take 1 tablet (100 mg total) by mouth daily. After your symptoms improved. 10/03/15   Merrily Pew, MD  colchicine 0.6 MG tablet Take 1 tablet (0.6 mg total) by mouth daily. Until symptoms improved. 10/03/15   Merrily Pew, MD  gabapentin (NEURONTIN) 300 MG capsule Take 1 capsule (300 mg total) by mouth 2 (two) times daily. Patient not taking: Reported on 10/03/2015  04/09/15   Kerrie Buffalo, NP  HYDROcodone-acetaminophen (NORCO/VICODIN) 5-325 MG tablet Take 1 tablet by mouth every 6 (six) hours as needed for moderate pain. Patient not taking: Reported on 10/03/2015 08/27/15   Carlyle Basques, MD  HYDROcodone-acetaminophen (NORCO/VICODIN) 5-325 MG tablet Take 1 tablet by mouth every 6 (six) hours as needed for severe pain. 10/03/15   Merrily Pew, MD  predniSONE (DELTASONE) 10 MG tablet Take 4 tablets (40 mg total) by mouth daily with breakfast. X 4 days, then 2 tabs x 3 days, then 1 tab x 3 days Patient not taking: Reported on 10/03/2015 08/27/15   Carlyle Basques, MD   BP 120/76 mmHg   Pulse 83  Temp(Src) 98.1 F (36.7 C) (Oral)  Resp 16  Ht 5\' 10"  (1.778 m)  Wt 170 lb (77.111 kg)  BMI 24.39 kg/m2  SpO2 99% Physical Exam  Constitutional: He is oriented to person, place, and time. He appears well-developed and well-nourished.  HENT:  Head: Normocephalic and atraumatic.  Neck: Normal range of motion.  Cardiovascular: Normal rate.   Pulmonary/Chest: Effort normal. No respiratory distress.  Abdominal: Soft. He exhibits no distension. There is no tenderness.  Musculoskeletal: Normal range of motion. He exhibits edema (right wrist) and tenderness (right wrist with ROM).  Neurological: He is alert and oriented to person, place, and time.  Skin: Skin is warm and dry.  Nursing note and vitals reviewed.   ED Course  Procedures (including critical care time) Labs Review Labs Reviewed - No data to display  Imaging Review No results found. I have personally reviewed and evaluated these images and lab results as part of my medical decision-making.   EKG Interpretation None      MDM   Final diagnoses:  Right wrist pain    45 year old male here with likely gout flare. Low suspicion for septic arthritis, cellulitis or DVT at this time. We'll treated with colchicine and a short course of Vicodin. Planned follow-up with his primary doctor for repeat evaluation in one to 2 weeks to ensure improvement. We'll start allopurinol after acute flare . New Prescriptions: New Prescriptions   COLCHICINE 0.6 MG TABLET    Take 1 tablet (0.6 mg total) by mouth daily. Until symptoms improved.   HYDROCODONE-ACETAMINOPHEN (NORCO/VICODIN) 5-325 MG TABLET    Take 1 tablet by mouth every 6 (six) hours as needed for severe pain.     I have personally and contemperaneously reviewed labs and imaging and used in my decision making as above.   A medical screening exam was performed and I feel the patient has had an appropriate workup for their chief complaint at this time and likelihood  of emergent condition existing is low and thus workup can continue on an outpatient basis.. Their vital signs are stable. They have been counseled on decision, discharge, follow up and which symptoms necessitate immediate return to the emergency department.  They verbally stated understanding and agreement with plan and discharged in stable condition.     Merrily Pew, MD 10/03/15 [phone]

## 2015-10-03 NOTE — ED Notes (Signed)
Bed: [id number] Expected date:  Expected time:  Means of arrival:  Comments: EMS- gout

## 2015-10-04 ENCOUNTER — Emergency Department (HOSPITAL_COMMUNITY)
Admission: EM | Admit: 2015-10-04 | Discharge: 2015-10-04 | Disposition: A | Discharge status: Home / Self Care | Payer: Federal, State, Local not specified - Other | Attending: Emergency Medicine | Admitting: Emergency Medicine

## 2015-10-04 ENCOUNTER — Encounter (HOSPITAL_COMMUNITY): Payer: Self-pay | Admitting: Emergency Medicine

## 2015-10-04 DIAGNOSIS — F17210 Nicotine dependence, cigarettes, uncomplicated: Secondary | ICD-10-CM | POA: Insufficient documentation

## 2015-10-04 DIAGNOSIS — F209 Schizophrenia, unspecified: Secondary | ICD-10-CM | POA: Insufficient documentation

## 2015-10-04 DIAGNOSIS — I10 Essential (primary) hypertension: Secondary | ICD-10-CM | POA: Insufficient documentation

## 2015-10-04 DIAGNOSIS — M25532 Pain in left wrist: Secondary | ICD-10-CM | POA: Insufficient documentation

## 2015-10-04 DIAGNOSIS — F329 Major depressive disorder, single episode, unspecified: Secondary | ICD-10-CM | POA: Insufficient documentation

## 2015-10-04 DIAGNOSIS — Z21 Asymptomatic human immunodeficiency virus [HIV] infection status: Secondary | ICD-10-CM | POA: Insufficient documentation

## 2015-10-04 DIAGNOSIS — Z79899 Other long term (current) drug therapy: Secondary | ICD-10-CM | POA: Insufficient documentation

## 2015-10-04 DIAGNOSIS — J45909 Unspecified asthma, uncomplicated: Secondary | ICD-10-CM | POA: Insufficient documentation

## 2015-10-04 MED ORDER — OXYCODONE-ACETAMINOPHEN 5-325 MG PO TABS
2.0000 | ORAL_TABLET | Freq: Once | ORAL | Status: AC
  Administered 2015-10-04: 2 via ORAL
  Filled 2015-10-04: qty 2

## 2015-10-04 MED ORDER — IBUPROFEN 200 MG PO TABS
600.0000 mg | ORAL_TABLET | Freq: Once | ORAL | Status: AC
  Administered 2015-10-04: 600 mg via ORAL
  Filled 2015-10-04: qty 3

## 2015-10-04 NOTE — ED Provider Notes (Signed)
CSN: [id number]     Arrival date & time 10/04/15  0845 History   First MD Initiated Contact with Patient 10/04/15 [phone]     Chief Complaint  Patient presents with  . Wrist Pain      HPI Patient presents emergency department with complaints of left wrist pain since awakening today.  He was seen in emergency department yesterday and was found to have gout in his right wrist.  He has long-standing history of gout.  He has not filled his medications from last night.  He denies fevers and chills.  He denies injury or trauma.  No reported fever.  Pain is moderate in severity.   Past Medical History  Diagnosis Date  . Asthma   . HIV (human immunodeficiency virus infection) ([place])     "dx'd ~ 2 yr ago" (09/29/2012)  . Mental disorder   . Anxiety   . Depression   . Chronic low back pain   . Alcohol abuse   . HIV disease ([place])   . Hypertension   . Gout   . Alcoholism ([place])   . Homelessness   . Cocaine abuse   . Marijuana abuse   . HIV (human immunodeficiency virus infection) ([place])   . Asthma   . Bipolar disorder ([place])   . Schizophrenia ([place])   . Gout    Past Surgical History  Procedure Laterality Date  . Skin graft full thickness leg Left ?    POSTERIOR LEFT LEG  AFTER DOG BITES   Family History  Problem Relation Age of Onset  . Alcoholism Mother   . Depression Mother   . Alcoholism Brother    Social History  Substance Use Topics  . Smoking status: Current Every Day Smoker -- 0.50 packs/day for 27 years    Types: Cigarettes  . Smokeless tobacco: Never Used     Comment: cutting back  . Alcohol Use: Yes     Comment: quit 06/2015    Review of Systems  All other systems reviewed and are negative.     Allergies  Shellfish allergy and Shellfish allergy  Home Medications   Prior to Admission medications   Medication Sig Start Date End Date Taking? Authorizing Provider  albuterol (PROVENTIL HFA;VENTOLIN HFA) 108 (90 BASE) MCG/ACT inhaler Inhale 2 puffs into the lungs  every 4 (four) hours as needed for wheezing or shortness of breath. 11/17/14   Hildred Priest, MD  allopurinol (ZYLOPRIM) 100 MG tablet Take 1 tablet (100 mg total) by mouth daily. After your symptoms improved. 10/03/15   Merrily Pew, MD  colchicine 0.6 MG tablet Take 1 tablet (0.6 mg total) by mouth daily. Until symptoms improved. 10/03/15   Merrily Pew, MD  Darunavir Ethanolate (PREZISTA) 800 MG tablet Take 1 tablet (800 mg total) by mouth daily with breakfast. For HIV infection 04/26/14   Encarnacion Slates, NP  divalproex (DEPAKOTE ER) 500 MG 24 hr tablet Take 2 tablets (1,000 mg total) by mouth at bedtime. 04/09/15   Kerrie Buffalo, NP  elvitegravir-cobicistat-emtricitabine-tenofovir (STRIBILD) 150-150-200-300 MG TABS tablet Take 1 tablet by mouth daily with breakfast. For HIV infection 04/26/14   Encarnacion Slates, NP  HYDROcodone-acetaminophen (NORCO/VICODIN) 5-325 MG tablet Take 1 tablet by mouth every 6 (six) hours as needed for severe pain. 10/03/15   Merrily Pew, MD  QUEtiapine (SEROQUEL) 100 MG tablet Take 5 tablets (500 mg total) by mouth at bedtime. 04/09/15   Kerrie Buffalo, NP   BP 124/81 mmHg  Pulse 102  Temp(Src) 98.1  F (36.7 C) (Oral)  Resp 16  Ht 5\' 10"  (1.778 m)  Wt 170 lb (77.111 kg)  BMI 24.39 kg/m2  SpO2 98% Physical Exam  Constitutional: He is oriented to person, place, and time. He appears well-developed and well-nourished.  HENT:  Head: Normocephalic.  Eyes: EOM are normal.  Neck: Normal range of motion.  Pulmonary/Chest: Effort normal.  Abdominal: He exhibits no distension.  Musculoskeletal: Normal range of motion.  Normal left radial pulse.  Normal grip strength left hand.  Mild swelling and warmth of the left wrist and pain with range of motion of the left wrist.  No overlying skin changes  Neurological: He is alert and oriented to person, place, and time.  Psychiatric: He has a normal mood and affect.  Nursing note and vitals reviewed.   ED Course   Procedures (including critical care time) Labs Review Labs Reviewed - No data to display  Imaging Review No results found. I have personally reviewed and evaluated these images and lab results as part of my medical decision-making.   EKG Interpretation None      MDM   Final diagnoses:  Left wrist pain    Symptoms appear consistent with inflammatory arthritis of the left wrist.  Patient has not filled his prescriptions yet from yesterday.  Left wrist splint for comfort.  Primary care follow-up if not improving.  Doubt septic arthritis.  Afebrile.  Well-appearing.    Jola Schmidt, MD 10/04/15 [phone]

## 2015-10-04 NOTE — ED Notes (Signed)
Pt from home c/o R wrist pain. Pt was seen at this facility yesterday and diagnosed with gout in R wrist and hand. Pt reports that he woke up with L wrist pain but denies injury. Pt L wrist appears to swollen and is unable to move wrist. Pt adds that he was given rx's yesterday for gout and pain but has not filled them yet. Pt is A&O and in NAD.

## 2015-10-09 MED FILL — HYDROCODON-APAP 5-325: 5-325 | 4 days supply | Qty: 15 | Fill #0

## 2015-10-27 ENCOUNTER — Encounter (HOSPITAL_COMMUNITY): Payer: Self-pay | Admitting: Emergency Medicine

## 2015-10-27 ENCOUNTER — Emergency Department (HOSPITAL_COMMUNITY)
Admission: EM | Admit: 2015-10-27 | Discharge: 2015-10-28 | Disposition: A | Discharge status: Home / Self Care | Payer: Federal, State, Local not specified - Other | Attending: Emergency Medicine | Admitting: Emergency Medicine

## 2015-10-27 DIAGNOSIS — F17210 Nicotine dependence, cigarettes, uncomplicated: Secondary | ICD-10-CM | POA: Insufficient documentation

## 2015-10-27 DIAGNOSIS — F4321 Adjustment disorder with depressed mood: Secondary | ICD-10-CM

## 2015-10-27 DIAGNOSIS — J45909 Unspecified asthma, uncomplicated: Secondary | ICD-10-CM | POA: Insufficient documentation

## 2015-10-27 DIAGNOSIS — Z21 Asymptomatic human immunodeficiency virus [HIV] infection status: Secondary | ICD-10-CM | POA: Insufficient documentation

## 2015-10-27 DIAGNOSIS — R45851 Suicidal ideations: Secondary | ICD-10-CM

## 2015-10-27 DIAGNOSIS — R440 Auditory hallucinations: Secondary | ICD-10-CM

## 2015-10-27 DIAGNOSIS — I10 Essential (primary) hypertension: Secondary | ICD-10-CM | POA: Insufficient documentation

## 2015-10-27 DIAGNOSIS — Z79899 Other long term (current) drug therapy: Secondary | ICD-10-CM | POA: Insufficient documentation

## 2015-10-27 LAB — CBC
HCT: 35.3 % — ABNORMAL LOW (ref 39.0–52.0)
Hemoglobin: 11.7 g/dL — ABNORMAL LOW (ref 13.0–17.0)
MCH: 34.3 pg — AB (ref 26.0–34.0)
MCHC: 33.1 g/dL (ref 30.0–36.0)
MCV: 103.5 fL — AB (ref 78.0–100.0)
PLATELETS: 237 10*3/uL (ref 150–400)
RBC: 3.41 MIL/uL — ABNORMAL LOW (ref 4.22–5.81)
RDW: 14 % (ref 11.5–15.5)
WBC: 8.8 10*3/uL (ref 4.0–10.5)

## 2015-10-27 LAB — RAPID URINE DRUG SCREEN, HOSP PERFORMED
AMPHETAMINES: NOT DETECTED
Barbiturates: NOT DETECTED
Benzodiazepines: NOT DETECTED
Cocaine: NOT DETECTED
Opiates: NOT DETECTED
Tetrahydrocannabinol: POSITIVE — AB

## 2015-10-27 LAB — SALICYLATE LEVEL: Salicylate Lvl: <4 mg/dL (ref 2.8–30.0)

## 2015-10-27 LAB — COMPREHENSIVE METABOLIC PANEL
ALT: 20 U/L (ref 17–63)
AST: 20 U/L (ref 15–41)
Albumin: 3.7 g/dL (ref 3.5–5.0)
Alkaline Phosphatase: 64 U/L (ref 38–126)
Anion gap: 8 (ref 5–15)
BILIRUBIN TOTAL: 0.7 mg/dL (ref 0.3–1.2)
BUN: 15 mg/dL (ref 6–20)
CALCIUM: 8.5 mg/dL — AB (ref 8.9–10.3)
CO2: 23 mmol/L (ref 22–32)
CREATININE: 1.02 mg/dL (ref 0.61–1.24)
Chloride: 107 mmol/L (ref 101–111)
Glucose, Bld: 91 mg/dL (ref 65–99)
Potassium: 3.4 mmol/L — ABNORMAL LOW (ref 3.5–5.1)
Sodium: 138 mmol/L (ref 135–145)
TOTAL PROTEIN: 6.4 g/dL — AB (ref 6.5–8.1)

## 2015-10-27 LAB — ACETAMINOPHEN LEVEL: Acetaminophen (Tylenol), Serum: <10 ug/mL — ABNORMAL LOW (ref 10–30)

## 2015-10-27 LAB — ETHANOL

## 2015-10-27 MED ORDER — ELVITEG-COBIC-EMTRICIT-TENOFAF 150-150-200-10 MG PO TABS
1.0000 | ORAL_TABLET | Freq: Every day | ORAL | Status: DC
  Administered 2015-10-28: 1 via ORAL
  Filled 2015-10-27: qty 1

## 2015-10-27 MED ORDER — COLCHICINE 0.6 MG PO TABS: 0.6000 mg | ORAL_TABLET | Freq: Every day | ORAL | Status: DC

## 2015-10-27 MED ORDER — ZOLPIDEM TARTRATE 5 MG PO TABS: 5.0000 mg | ORAL_TABLET | Freq: Every evening | ORAL | Status: DC | PRN

## 2015-10-27 MED ORDER — NICOTINE 21 MG/24HR TD PT24
21.0000 mg | MEDICATED_PATCH | Freq: Every day | TRANSDERMAL | Status: DC
  Administered 2015-10-27 – 2015-10-28 (×2): 21 mg via TRANSDERMAL
  Filled 2015-10-27 (×2): qty 1

## 2015-10-27 MED ORDER — DARUNAVIR ETHANOLATE 800 MG PO TABS
800.0000 mg | ORAL_TABLET | Freq: Every day | ORAL | Status: DC
  Administered 2015-10-28: 800 mg via ORAL
  Filled 2015-10-27: qty 1

## 2015-10-27 MED ORDER — ALBUTEROL SULFATE HFA 108 (90 BASE) MCG/ACT IN AERS: 2.0000 | INHALATION_SPRAY | RESPIRATORY_TRACT | Status: DC | PRN

## 2015-10-27 MED ORDER — ACETAMINOPHEN 325 MG PO TABS: 650.0000 mg | ORAL_TABLET | ORAL | Status: DC | PRN

## 2015-10-27 MED ORDER — ALLOPURINOL 100 MG PO TABS
100.0000 mg | ORAL_TABLET | Freq: Every day | ORAL | Status: DC
  Administered 2015-10-28: 100 mg via ORAL
  Filled 2015-10-27: qty 1

## 2015-10-27 MED ORDER — ALUM & MAG HYDROXIDE-SIMETH 200-200-20 MG/5ML PO SUSP: 30.0000 mL | ORAL | Status: DC | PRN

## 2015-10-27 MED ORDER — ONDANSETRON HCL 4 MG PO TABS: 4.0000 mg | ORAL_TABLET | Freq: Three times a day (TID) | ORAL | Status: DC | PRN

## 2015-10-27 MED ORDER — LORAZEPAM 1 MG PO TABS
1.0000 mg | ORAL_TABLET | Freq: Three times a day (TID) | ORAL | Status: DC | PRN
  Administered 2015-10-27: 1 mg via ORAL
  Filled 2015-10-27: qty 1

## 2015-10-27 NOTE — ED Notes (Addendum)
Pt was wanded by security, and one bag of belongings placed at the nursing station. Pt stated that he did not have any money or valuables with him.

## 2015-10-27 NOTE — ED Notes (Signed)
PT wanded by security ?

## 2015-10-27 NOTE — ED Triage Notes (Signed)
Pt BIB [facility] voluntarily after having suicidal ideation with a plan of slitting his throat with his pocket knife. States that he is hearing voices and seeing things and he has been off of his meds x 3-4 months. Alert and oriented but appears anxious.
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
## 2015-10-28 DIAGNOSIS — F4321 Adjustment disorder with depressed mood: Secondary | ICD-10-CM

## 2015-10-28 HISTORY — DX: Adjustment disorder with depressed mood: F43.21

## 2015-10-28 MED ORDER — QUETIAPINE FUMARATE 100 MG PO TABS: 500.0000 mg | ORAL_TABLET | Freq: Every day | ORAL | 0 refills | Status: DC

## 2015-10-28 MED ORDER — QUETIAPINE FUMARATE 300 MG PO TABS: 500.0000 mg | ORAL_TABLET | Freq: Every day | ORAL | Status: DC

## 2015-10-28 MED ORDER — DIVALPROEX SODIUM 500 MG PO DR TAB
500.0000 mg | DELAYED_RELEASE_TABLET | Freq: Two times a day (BID) | ORAL | Status: DC
  Administered 2015-10-28: 500 mg via ORAL
  Filled 2015-10-28: qty 1

## 2015-10-28 MED ORDER — DIVALPROEX SODIUM 500 MG PO DR TAB: 500.0000 mg | DELAYED_RELEASE_TABLET | Freq: Two times a day (BID) | ORAL | 0 refills | Status: DC

## 2015-10-28 NOTE — ED Notes (Signed)
Patient reports that he was brought in to [facility] after trying to cut his throat.  Patient says he has been without his medications for about 3-4 months. Patient admits to [facility] with plan to cut his throat, and AVH. Patient reports that he has been hearing voices telling him to harm others and then himself.  Has been seeing little green and purple men. Patient states he has no intent to harm anyone at this time. Plan of care discussed with patient. Patient voices no complaints or concerns at this time. [place] sandwich and soda given to patient. Encouragement and support provided and safety maintain. Q 15 min safety checks in place and video monitoring.
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
## 2015-10-28 NOTE — ED Notes (Signed)
Pt states that he continues to feel like killing himself. He appears to be depressed. Currently eating breakfast.
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
## 2015-10-28 NOTE — ED Notes (Signed)
Pt expressed readiness to be discharged. Prescriptions given and discussed at discharged. All belongings returned to pt who signed for same. Pt given bus ticket and escorted to the exit. He continued to be calm and cooperative with no complaints voiced.

## 2015-10-28 NOTE — BH Assessment (Signed)
[place] Assessment Progress Note  Per Hampton Abbot, MD, this pt does not require psychiatric hospitalization at this time.  Pt is to be discharged from [facility] with recommendation to follow up with [facility].  This has been included in pt's discharge instructions.  Pt's nurse, Diane, has been notified.  Jalene Mullet, [place] Triage Specialist [phone]
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
## 2015-10-28 NOTE — Discharge Instructions (Signed)
Gout Gout is an inflammatory arthritis caused by a buildup of uric acid crystals in the joints. Uric acid is a chemical that is normally present in the blood. When the level of uric acid in the blood is too high it can form crystals that deposit in your joints and tissues. This causes joint redness, soreness, and swelling (inflammation). Repeat attacks are common. Over time, uric acid crystals can form into masses (tophi) near a joint, destroying bone and causing disfigurement. Gout is treatable and often preventable. CAUSES  The disease begins with elevated levels of uric acid in the blood. Uric acid is produced by your body when it breaks down a naturally found substance called purines. Certain foods you eat, such as meats and fish, contain high amounts of purines. Causes of an elevated uric acid level include:  Being passed down from parent to child (heredity).  Diseases that cause increased uric acid production (such as obesity, psoriasis, and certain cancers).  Excessive alcohol use.  Diet, especially diets rich in meat and seafood.  Medicines, including certain cancer-fighting medicines (chemotherapy), water pills (diuretics), and aspirin.  Chronic kidney disease. The kidneys are no longer able to remove uric acid well.  Problems with metabolism. Conditions strongly associated with gout include:  Obesity.  High blood pressure.  High cholesterol.  Diabetes. Not everyone with elevated uric acid levels gets gout. It is not understood why some people get gout and others do not. Surgery, joint injury, and eating too much of certain foods are some of the factors that can lead to gout attacks. SYMPTOMS   An attack of gout comes on quickly. It causes intense pain with redness, swelling, and warmth in a joint.  Fever can occur.  Often, only one joint is involved. Certain joints are more commonly involved:  Base of the big toe.  Knee.  Ankle.  Wrist.  Finger. Without  treatment, an attack usually goes away in a few days to weeks. Between attacks, you usually will not have symptoms, which is different from many other forms of arthritis. DIAGNOSIS  Your caregiver will suspect gout based on your symptoms and exam. In some cases, tests may be recommended. The tests may include:  Blood tests.  Urine tests.  X-rays.  Joint fluid exam. This exam requires a needle to remove fluid from the joint (arthrocentesis). Using a microscope, gout is confirmed when uric acid crystals are seen in the joint fluid. TREATMENT  There are two phases to gout treatment: treating the sudden onset (acute) attack and preventing attacks (prophylaxis).  Treatment of an Acute Attack.  Medicines are used. These include anti-inflammatory medicines or steroid medicines.  An injection of steroid medicine into the affected joint is sometimes necessary.  The painful joint is rested. Movement can worsen the arthritis.  You may use warm or cold treatments on painful joints, depending which works best for you.  Treatment to Prevent Attacks.  If you suffer from frequent gout attacks, your caregiver may advise preventive medicine. These medicines are started after the acute attack subsides. These medicines either help your kidneys eliminate uric acid from your body or decrease your uric acid production. You may need to stay on these medicines for a very long time.  The early phase of treatment with preventive medicine can be associated with an increase in acute gout attacks. For this reason, during the first few months of treatment, your caregiver may also advise you to take medicines usually used for acute gout treatment. Be sure you  understand your caregiver's directions. Your caregiver may make several adjustments to your medicine dose before these medicines are effective.  Discuss dietary treatment with your caregiver or dietitian. Alcohol and drinks high in sugar and fructose and foods  such as meat, poultry, and seafood can increase uric acid levels. Your caregiver or dietitian can advise you on drinks and foods that should be limited. HOME CARE INSTRUCTIONS   Do not take aspirin to relieve pain. This raises uric acid levels.  Only take over-the-counter or prescription medicines for pain, discomfort, or fever as directed by your caregiver.  Rest the joint as much as possible. When in bed, keep sheets and blankets off painful areas.  Keep the affected joint raised (elevated).  Apply warm or cold treatments to painful joints. Use of warm or cold treatments depends on which works best for you.  Use crutches if the painful joint is in your leg.  Drink enough fluids to keep your urine clear or pale yellow. This helps your body get rid of uric acid. Limit alcohol, sugary drinks, and fructose drinks.  Follow your dietary instructions. Pay careful attention to the amount of protein you eat. Your daily diet should emphasize fruits, vegetables, whole grains, and fat-free or low-fat milk products. Discuss the use of coffee, vitamin C, and cherries with your caregiver or dietitian. These may be helpful in lowering uric acid levels.  Maintain a healthy body weight. SEEK MEDICAL CARE IF:   You develop diarrhea, vomiting, or any side effects from medicines.  You do not feel better in 24 hours, or you are getting worse. SEEK IMMEDIATE MEDICAL CARE IF:   Your joint becomes suddenly more tender, and you have chills or a fever. MAKE SURE YOU:   Understand these instructions.  Will watch your condition.  Will get help right away if you are not doing well or get worse.   This information is not intended to replace advice given to you by your health care provider. Make sure you discuss any questions you have with your health care provider.   Document Released: 02/27/2000 Document Revised: 03/22/2014 Document Reviewed: 10/13/2011 Elsevier Interactive Patient Education 2016  [place] you are advised to follow up with [facility].  New and returning patients are seen at their walk-in clinic.  Walk-in hours are Monday - Friday from 8:00 am - 3:00 pm.  Walk-in patients are seen on a first come, first served basis.  Try to arrive as early as possible for he best chance of being seen the same day:       [facility]      [address]      [place], [place] [address]      [phone]

## 2015-11-27 ENCOUNTER — Ambulatory Visit: Payer: Self-pay | Admitting: Internal Medicine

## 2016-01-26 ENCOUNTER — Ambulatory Visit: Payer: Self-pay | Admitting: Internal Medicine

## 2016-04-21 ENCOUNTER — Ambulatory Visit: Payer: Self-pay | Admitting: Internal Medicine

## 2016-04-29 ENCOUNTER — Encounter: Payer: Self-pay | Admitting: Internal Medicine

## 2016-05-13 ENCOUNTER — Other Ambulatory Visit: Payer: Self-pay

## 2016-05-27 ENCOUNTER — Ambulatory Visit: Payer: Self-pay | Admitting: Internal Medicine

## 2016-09-07 ENCOUNTER — Other Ambulatory Visit: Payer: Self-pay | Admitting: Pharmacist

## 2016-09-07 ENCOUNTER — Ambulatory Visit (INDEPENDENT_AMBULATORY_CARE_PROVIDER_SITE_OTHER): Payer: Self-pay | Admitting: Pharmacist

## 2016-09-07 DIAGNOSIS — B20 Human immunodeficiency virus [HIV] disease: Secondary | ICD-10-CM

## 2016-09-07 LAB — CBC
HCT: 43.3 % (ref 38.5–50.0)
Hemoglobin: 14.3 g/dL (ref 13.2–17.1)
MCH: 30.6 pg (ref 27.0–33.0)
MCHC: 33 g/dL (ref 32.0–36.0)
MCV: 92.7 fL (ref 80.0–100.0)
MPV: 9.3 fL (ref 7.5–12.5)
PLATELETS: 308 10*3/uL (ref 140–400)
RBC: 4.67 MIL/uL (ref 4.20–5.80)
RDW: 13.4 % (ref 11.0–15.0)
WBC: 5.5 10*3/uL (ref 3.8–10.8)

## 2016-09-07 MED ORDER — ELVITEG-COBIC-EMTRICIT-TENOFAF 150-150-200-10 MG PO TABS: 1.0000 | ORAL_TABLET | Freq: Every day | ORAL | 5 refills | Status: DC

## 2016-09-07 MED ORDER — DARUNAVIR ETHANOLATE 800 MG PO TABS: 800.0000 mg | ORAL_TABLET | Freq: Every day | ORAL | 5 refills | Status: DC

## 2016-09-07 NOTE — Progress Notes (Signed)
HPI: John [name] is a 46 y.o. male who presents to the [facility] clinic today as a walk-in. He has been out of care for ~1 year.   Allergies: Allergies  Allergen Reactions  . Shellfish Allergy Anaphylaxis and Swelling  . Shellfish Allergy Anaphylaxis    Past Medical History: Past Medical History:  Diagnosis Date  . Alcohol abuse   . Alcoholism ([place])   . Anxiety   . Asthma   . Asthma   . Bipolar disorder ([place])   . Chronic low back pain   . Cocaine abuse   . Depression   . Gout   . Gout   . HIV (human immunodeficiency virus infection) ([place])    "dx'd ~ 2 yr ago" (09/29/2012)  . HIV (human immunodeficiency virus infection) ([place])   . HIV disease ([place])   . Homelessness   . Hypertension   . Marijuana abuse   . Mental disorder   . Schizophrenia [facility])     Social History: Social History   Social History  . Marital status: Single    Spouse name: N/A  . Number of children: N/A  . Years of education: N/A   Social History Main Topics  . Smoking status: Current Every Day Smoker    Packs/day: 0.50    Years: 27.00    Types: Cigarettes  . Smokeless tobacco: Never Used     Comment: cutting back  . Alcohol use Yes     Comment: quit 06/2015  . Drug use: No     Comment: trying to quit (not used since 07/2015)  . Sexual activity: No     Comment: pt. given condoms   Other Topics Concern  . Not on file   Social History Narrative   ** Merged History Encounter **       ** Merged History Encounter **        Current Regimen: None right now  Labs: HIV 1 RNA Quant (copies/mL)  Date Value  07/24/2015 <20  02/27/2015 <20  02/12/2015 <20   CD4 (no units)  Date Value  07/24/2015 948  02/27/2015 503  08/23/2014 515   CD4 T Cell Abs (/uL)  Date Value  02/12/2015 460   Hep B S Ab (no units)  Date Value  04/08/2010 NEG   Hepatitis B Surface Ag (no units)  Date Value  11/14/2013 NEGATIVE   HCV Ab (no units)  Date Value  11/14/2013 NEGATIVE     CrCl: CrCl cannot be calculated (Patient's most recent lab result is older than the maximum 21 days allowed.).  Lipids:    Component Value Date/Time   CHOL 142 07/24/2015 0940   TRIG 73 07/24/2015 0940   HDL 89 07/24/2015 0940   CHOLHDL 1.6 07/24/2015 0940   VLDL 15 07/24/2015 0940   LDLCALC 38 07/24/2015 0940    Assessment: John [name] is here today as a walk-in. He has not seen Dr. Baxter Flattery since June of 2017.  He walks in today requesting to see someone regarding his medications.  He states he has been incarcerated x 6 months and was released this past February. He states his Aunt moved while he was incarcerated and all of his stuff including his medications are in storage that he is not able to access.  I asked him what he was taking and he handed me a very old bottle of Genvoya and states he was also taking another pill but has been out of that for along time.  He states he hasn't  taken the [facility] or the other medication since being released from prison because he did not have both medications. He does say that he took the medications while in prison.  He was previously on [place].   He is homeless currently, moving around from place to place.  He says he is going to get back in with [facility] and hopefully find housing.  He doesn't have any of medications, including his psych meds.  He says he is interested in [facility] and was provided information on that, so hopefully he goes as a walk in and gets established there. I will get labs on him today and make follow-up appointments for him.  It looks like he no showed several of his last appts with Dr. Baxter Flattery.  I explained our new no show policy to him and told him to make sure he calls 24 hrs in advance if he needs to cancel. I will bring him back in ~3 weeks to do labs again once back on medications. His ADAP is active until end of September.  I told him we would renew in July when he comes back to see me. He takes the bus and was provided  bus passes when he left. The number on his profile is his current one and is active.  Plans: - Restart Genvoya + Prezista once daily - HIV VL, CD4, RPR, CBC, CMET today - F/u with me again 7/18 at 10am - F/u with Dr. Baxter Flattery 8/2 at [facility]. [place], PharmD, [facility] 09/07/2016, 4:23 PM

## 2016-09-08 LAB — COMPREHENSIVE METABOLIC PANEL
ALT: 14 U/L (ref 9–46)
AST: 20 U/L (ref 10–40)
Albumin: 3.9 g/dL (ref 3.6–5.1)
Alkaline Phosphatase: 85 U/L (ref 40–115)
BUN: 15 mg/dL (ref 7–25)
CO2: 22 mmol/L (ref 20–31)
Calcium: 8.9 mg/dL (ref 8.6–10.3)
Chloride: 106 mmol/L (ref 98–110)
Creat: 0.99 mg/dL (ref 0.60–1.35)
Glucose, Bld: 75 mg/dL (ref 65–99)
Potassium: 4.8 mmol/L (ref 3.5–5.3)
Sodium: 138 mmol/L (ref 135–146)
Total Bilirubin: 0.5 mg/dL (ref 0.2–1.2)
Total Protein: 6.9 g/dL (ref 6.1–8.1)

## 2016-09-08 LAB — RPR

## 2016-09-08 LAB — T-HELPER CELL (CD4) - (RCID CLINIC ONLY)
CD4 % Helper T Cell: 27 % — ABNORMAL LOW (ref 33–55)
CD4 T Cell Abs: 750 /uL (ref 400–2700)

## 2016-09-09 LAB — HIV-1 RNA,QN PCR W/REFLEX GENOTYPE
HIV-1 RNA, QN PCR: 1670 Copies/mL — ABNORMAL HIGH
HIV-1 RNA, QN PCR: 3.22 Log cps/mL — ABNORMAL HIGH

## 2016-09-10 ENCOUNTER — Other Ambulatory Visit: Payer: Self-pay | Admitting: *Deleted

## 2016-09-10 DIAGNOSIS — B20 Human immunodeficiency virus [HIV] disease: Secondary | ICD-10-CM

## 2016-09-10 MED ORDER — DARUNAVIR ETHANOLATE 800 MG PO TABS: 800.0000 mg | ORAL_TABLET | Freq: Every day | ORAL | 5 refills | Status: DC

## 2016-09-29 ENCOUNTER — Ambulatory Visit: Payer: Self-pay

## 2016-10-14 ENCOUNTER — Encounter: Payer: Self-pay | Admitting: Internal Medicine

## 2016-10-14 ENCOUNTER — Other Ambulatory Visit: Payer: Self-pay | Admitting: Internal Medicine

## 2016-10-14 ENCOUNTER — Ambulatory Visit (INDEPENDENT_AMBULATORY_CARE_PROVIDER_SITE_OTHER): Payer: Federal, State, Local not specified - Other | Admitting: Licensed Clinical Social Worker

## 2016-10-14 ENCOUNTER — Ambulatory Visit: Payer: Self-pay

## 2016-10-14 ENCOUNTER — Ambulatory Visit (INDEPENDENT_AMBULATORY_CARE_PROVIDER_SITE_OTHER): Payer: Self-pay | Admitting: Internal Medicine

## 2016-10-14 VITALS — BP 124/77 | HR 69 | Temp 98.6°F | Ht 69.0 in | Wt 174.0 lb

## 2016-10-14 DIAGNOSIS — B20 Human immunodeficiency virus [HIV] disease: Secondary | ICD-10-CM

## 2016-10-14 DIAGNOSIS — F319 Bipolar disorder, unspecified: Secondary | ICD-10-CM

## 2016-10-14 DIAGNOSIS — F209 Schizophrenia, unspecified: Secondary | ICD-10-CM

## 2016-10-14 DIAGNOSIS — Z9119 Patient's noncompliance with other medical treatment and regimen: Secondary | ICD-10-CM

## 2016-10-14 DIAGNOSIS — Z91199 Patient's noncompliance with other medical treatment and regimen due to unspecified reason: Secondary | ICD-10-CM

## 2016-10-15 ENCOUNTER — Encounter: Payer: Self-pay | Admitting: Internal Medicine
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
## 2016-10-18 ENCOUNTER — Telehealth: Payer: Self-pay | Admitting: *Deleted
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
## 2016-10-18 NOTE — Telephone Encounter (Signed)
Patient referred to liver clinic upstairs for Hep C and referral denied, as there is no proof of positive Hep C. Patient was referred to Internal Medicine clinic for primary care and appt scheduled for 10/25/16 at 1:15 pm. Called and left message with emergency contact (his Aunt) to have patient call me with referral information. Patient does not have a phone. Myrtis Hopping

## 2016-10-19 NOTE — Telephone Encounter (Signed)
Per Dr. Baxter Flattery this was an error; patient did not need to be referred.

## 2016-10-19 NOTE — Telephone Encounter (Signed)
Patient did not return my call; appt information mailed. John [name]

## 2016-10-25 ENCOUNTER — Ambulatory Visit: Payer: Self-pay

## 2016-12-01 ENCOUNTER — Ambulatory Visit: Payer: Self-pay

## 2016-12-14 ENCOUNTER — Ambulatory Visit: Payer: Self-pay | Admitting: Internal Medicine

## 2016-12-21 ENCOUNTER — Emergency Department (HOSPITAL_COMMUNITY)
Admission: EM | Admit: 2016-12-21 | Discharge: 2016-12-21 | Disposition: A | Discharge status: Home / Self Care | Payer: Self-pay | Attending: Emergency Medicine | Admitting: Emergency Medicine

## 2016-12-21 ENCOUNTER — Encounter (HOSPITAL_COMMUNITY): Payer: Self-pay | Admitting: Emergency Medicine

## 2016-12-21 ENCOUNTER — Emergency Department (HOSPITAL_COMMUNITY): Payer: Self-pay

## 2016-12-21 ENCOUNTER — Ambulatory Visit: Payer: Self-pay | Admitting: Internal Medicine

## 2016-12-21 DIAGNOSIS — F17210 Nicotine dependence, cigarettes, uncomplicated: Secondary | ICD-10-CM | POA: Insufficient documentation

## 2016-12-21 DIAGNOSIS — Y929 Unspecified place or not applicable: Secondary | ICD-10-CM | POA: Insufficient documentation

## 2016-12-21 DIAGNOSIS — Y9301 Activity, walking, marching and hiking: Secondary | ICD-10-CM | POA: Insufficient documentation

## 2016-12-21 DIAGNOSIS — X500XXA Overexertion from strenuous movement or load, initial encounter: Secondary | ICD-10-CM | POA: Insufficient documentation

## 2016-12-21 DIAGNOSIS — Y999 Unspecified external cause status: Secondary | ICD-10-CM | POA: Insufficient documentation

## 2016-12-21 DIAGNOSIS — S92334A Nondisplaced fracture of third metatarsal bone, right foot, initial encounter for closed fracture: Secondary | ICD-10-CM | POA: Insufficient documentation

## 2016-12-21 DIAGNOSIS — B20 Human immunodeficiency virus [HIV] disease: Secondary | ICD-10-CM | POA: Insufficient documentation

## 2016-12-21 DIAGNOSIS — J4520 Mild intermittent asthma, uncomplicated: Secondary | ICD-10-CM | POA: Insufficient documentation

## 2016-12-21 DIAGNOSIS — Z79899 Other long term (current) drug therapy: Secondary | ICD-10-CM | POA: Insufficient documentation

## 2016-12-21 MED ORDER — IPRATROPIUM-ALBUTEROL 0.5-2.5 (3) MG/3ML IN SOLN
3.0000 mL | Freq: Once | RESPIRATORY_TRACT | Status: AC
  Administered 2016-12-21: 3 mL via RESPIRATORY_TRACT
  Filled 2016-12-21: qty 3

## 2016-12-21 MED ORDER — ALBUTEROL SULFATE HFA 108 (90 BASE) MCG/ACT IN AERS
2.0000 | INHALATION_SPRAY | RESPIRATORY_TRACT | Status: DC | PRN
  Administered 2016-12-21: 2 via RESPIRATORY_TRACT
  Filled 2016-12-21: qty 6.7
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
## 2016-12-21 NOTE — ED Notes (Signed)
Patient states he had a positive response from neb treatment

## 2016-12-21 NOTE — Discharge Instructions (Signed)
Use your inhaler 2 puffs every 4 hours as needed. Take tylenol as needed for pain. Follow up with Marliss Coots and she will refer you to an orthopedic doctor if needed.

## 2016-12-21 NOTE — ED Triage Notes (Signed)
Per EMS-states out of inhaler for a week-increased asthma symptoms-states he stepped in ditch yesterday and twisted right foot-given Albuterol in route-wheezing cleared after breathing treatment

## 2016-12-21 NOTE — Progress Notes (Signed)
Orthopedic Tech Progress Note Patient Details:  John [name] [birth year] [id number]  Ortho Devices Type of Ortho Device: Postop shoe/boot   [place] Pink 12/21/2016, 1:05 PM

## 2017-01-08 ENCOUNTER — Encounter (HOSPITAL_COMMUNITY): Payer: Self-pay | Admitting: Emergency Medicine

## 2017-01-08 ENCOUNTER — Emergency Department (HOSPITAL_COMMUNITY): Payer: Self-pay

## 2017-01-08 ENCOUNTER — Emergency Department (HOSPITAL_COMMUNITY)
Admission: EM | Admit: 2017-01-08 | Discharge: 2017-01-08 | Disposition: A | Discharge status: Home / Self Care | Payer: Self-pay | Attending: Emergency Medicine | Admitting: Emergency Medicine

## 2017-01-08 DIAGNOSIS — Z79899 Other long term (current) drug therapy: Secondary | ICD-10-CM | POA: Insufficient documentation

## 2017-01-08 DIAGNOSIS — I10 Essential (primary) hypertension: Secondary | ICD-10-CM | POA: Insufficient documentation

## 2017-01-08 DIAGNOSIS — F17210 Nicotine dependence, cigarettes, uncomplicated: Secondary | ICD-10-CM | POA: Insufficient documentation

## 2017-01-08 DIAGNOSIS — B20 Human immunodeficiency virus [HIV] disease: Secondary | ICD-10-CM | POA: Insufficient documentation

## 2017-01-08 DIAGNOSIS — K2920 Alcoholic gastritis without bleeding: Secondary | ICD-10-CM | POA: Insufficient documentation

## 2017-01-08 DIAGNOSIS — J45909 Unspecified asthma, uncomplicated: Secondary | ICD-10-CM | POA: Insufficient documentation

## 2017-01-08 LAB — URINALYSIS, ROUTINE W REFLEX MICROSCOPIC
Bilirubin Urine: NEGATIVE
GLUCOSE, UA: NEGATIVE mg/dL
HGB URINE DIPSTICK: NEGATIVE
Ketones, ur: NEGATIVE mg/dL
Leukocytes, UA: NEGATIVE
Nitrite: NEGATIVE
PROTEIN: NEGATIVE mg/dL
Specific Gravity, Urine: 1.01 (ref 1.005–1.030)
pH: 5 (ref 5.0–8.0)

## 2017-01-08 LAB — COMPREHENSIVE METABOLIC PANEL
ALBUMIN: 4 g/dL (ref 3.5–5.0)
ALT: 18 U/L (ref 17–63)
AST: 25 U/L (ref 15–41)
Alkaline Phosphatase: 74 U/L (ref 38–126)
Anion gap: 11 (ref 5–15)
BUN: 6 mg/dL (ref 6–20)
CHLORIDE: 104 mmol/L (ref 101–111)
CO2: 21 mmol/L — AB (ref 22–32)
CREATININE: 1.27 mg/dL — AB (ref 0.61–1.24)
Calcium: 9.2 mg/dL (ref 8.9–10.3)
GFR calc Af Amer: >60 mL/min (ref >60)
GFR calc non Af Amer: >60 mL/min (ref >60)
Glucose, Bld: 104 mg/dL — ABNORMAL HIGH (ref 65–99)
Potassium: 4 mmol/L (ref 3.5–5.1)
SODIUM: 136 mmol/L (ref 135–145)
Total Bilirubin: 0.6 mg/dL (ref 0.3–1.2)
Total Protein: 7.4 g/dL (ref 6.5–8.1)

## 2017-01-08 LAB — CBC
HCT: 40.9 % (ref 39.0–52.0)
Hemoglobin: 14.2 g/dL (ref 13.0–17.0)
MCH: 33.3 pg (ref 26.0–34.0)
MCHC: 34.7 g/dL (ref 30.0–36.0)
MCV: 96 fL (ref 78.0–100.0)
PLATELETS: 343 10*3/uL (ref 150–400)
RBC: 4.26 MIL/uL (ref 4.22–5.81)
RDW: 13.5 % (ref 11.5–15.5)
WBC: 5.2 10*3/uL (ref 4.0–10.5)

## 2017-01-08 LAB — RAPID URINE DRUG SCREEN, HOSP PERFORMED
Amphetamines: NOT DETECTED
BARBITURATES: NOT DETECTED
Benzodiazepines: NOT DETECTED
Cocaine: POSITIVE — AB
Opiates: NOT DETECTED
Tetrahydrocannabinol: POSITIVE — AB

## 2017-01-08 LAB — LIPASE, BLOOD: Lipase: 18 U/L (ref 11–51)

## 2017-01-08 MED ORDER — OMEPRAZOLE 20 MG PO CPDR: 20.0000 mg | DELAYED_RELEASE_CAPSULE | Freq: Every day | ORAL | 0 refills | Status: DC

## 2017-01-08 MED ORDER — DICYCLOMINE HCL 10 MG/ML IM SOLN
20.0000 mg | Freq: Once | INTRAMUSCULAR | Status: AC
  Administered 2017-01-08: 20 mg via INTRAMUSCULAR
  Filled 2017-01-08: qty 2

## 2017-01-08 MED ORDER — SUCRALFATE 1 GM/10ML PO SUSP: 1.0000 g | Freq: Three times a day (TID) | ORAL | 0 refills | Status: DC

## 2017-01-08 MED ORDER — HALOPERIDOL LACTATE 5 MG/ML IJ SOLN
2.0000 mg | Freq: Once | INTRAMUSCULAR | Status: AC
  Administered 2017-01-08: 2 mg via INTRAMUSCULAR
  Filled 2017-01-08: qty 1

## 2017-01-08 MED ORDER — GI COCKTAIL ~~LOC~~
30.0000 mL | Freq: Once | ORAL | Status: AC
  Administered 2017-01-08: 30 mL via ORAL
  Filled 2017-01-08: qty 30

## 2017-01-08 MED ORDER — DICYCLOMINE HCL 20 MG PO TABS: 20.0000 mg | ORAL_TABLET | Freq: Two times a day (BID) | ORAL | 0 refills | Status: DC

## 2017-01-08 NOTE — ED Notes (Signed)
Pt reports an intermittent pain in the middle of his abdomen. States "it comes and goes." pt reports taking an unknown pain pill given by "a girlfriend of his" but states that he dose not know what medication it was but "it didn't help"

## 2017-01-08 NOTE — ED Triage Notes (Signed)
C/o generalized abd pain x 2 hours.  Pain started while drinking etoh so he drank more to see if it would go away.  That didn't help so he smoked marijuana and crack.  That didn't help so he called 911.  Denies nausea, vomiting, diarrhea, and urinary complaint.
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
## 2017-02-16 ENCOUNTER — Other Ambulatory Visit (HOSPITAL_COMMUNITY): Admission: RE | Admit: 2017-02-16 | Payer: Self-pay | Source: Ambulatory Visit | Admitting: Internal Medicine

## 2017-02-16 ENCOUNTER — Other Ambulatory Visit: Payer: Self-pay | Admitting: Pharmacist Clinician (PhC)/ Clinical Pharmacy Specialist

## 2017-02-16 ENCOUNTER — Other Ambulatory Visit (INDEPENDENT_AMBULATORY_CARE_PROVIDER_SITE_OTHER): Payer: Self-pay

## 2017-02-16 ENCOUNTER — Ambulatory Visit (INDEPENDENT_AMBULATORY_CARE_PROVIDER_SITE_OTHER): Payer: Self-pay | Admitting: Pharmacist Clinician (PhC)/ Clinical Pharmacy Specialist

## 2017-02-16 DIAGNOSIS — B20 Human immunodeficiency virus [HIV] disease: Secondary | ICD-10-CM

## 2017-02-16 DIAGNOSIS — Z23 Encounter for immunization: Secondary | ICD-10-CM

## 2017-02-16 NOTE — Progress Notes (Signed)
HPI: Tabius Rood is a 46 y.o. male who walked in with Mitch today to see pharmacy.   Allergies: Allergies  Allergen Reactions  . Shellfish Allergy Anaphylaxis and Swelling  . Shellfish Allergy Anaphylaxis    Vitals:    Past Medical History: Past Medical History:  Diagnosis Date  . Alcohol abuse   . Alcoholism ([place])   . Anxiety   . Asthma   . Asthma   . Bipolar disorder ([place])   . Chronic low back pain   . Cocaine abuse ([place])   . Depression   . Gout   . Gout   . HIV (human immunodeficiency virus infection) ([place])    "dx'd ~ 2 yr ago" (09/29/2012)  . HIV (human immunodeficiency virus infection) ([place])   . HIV disease ([place])   . Homelessness   . Hypertension   . Marijuana abuse   . Mental disorder   . Schizophrenia [facility])     Social History: Social History   Socioeconomic History  . Marital status: Single    Spouse name: Not on file  . Number of children: Not on file  . Years of education: Not on file  . Highest education level: Not on file  Social Needs  . Financial resource strain: Not on file  . Food insecurity - worry: Not on file  . Food insecurity - inability: Not on file  . Transportation needs - medical: Not on file  . Transportation needs - non-medical: Not on file  Occupational History  . Not on file  Tobacco Use  . Smoking status: Current Every Day Smoker    Packs/day: 0.50    Years: 27.00    Pack years: 13.50    Types: Cigarettes  . Smokeless tobacco: Never Used  . Tobacco comment: cutting back  Substance and Sexual Activity  . Alcohol use: Yes    Comment: quit 06/2015  . Drug use: Yes    Types: Cocaine, Marijuana  . Sexual activity: No    Comment: pt. given condoms  Other Topics Concern  . Not on file  Social History Narrative   ** Merged History Encounter **       ** Merged History Encounter **        Previous Regimen: Start study>>Stribild/Genvoya-DRV  Current Regimen: Off  Labs: HIV 1 RNA Quant (copies/mL)  Date Value   07/24/2015 <20  02/27/2015 <20  02/12/2015 <20   CD4 (no units)  Date Value  07/24/2015 948  02/27/2015 503  08/23/2014 515   CD4 T Cell Abs (/uL)  Date Value  09/07/2016 750  02/12/2015 460   Hep B S Ab (no units)  Date Value  04/08/2010 NEG   Hepatitis B Surface Ag (no units)  Date Value  11/14/2013 NEGATIVE   HCV Ab (no units)  Date Value  11/14/2013 NEGATIVE    CrCl: CrCl cannot be calculated (Patient's most recent lab result is older than the maximum 21 days allowed.).  Lipids:    Component Value Date/Time   CHOL 142 07/24/2015 0940   TRIG 73 07/24/2015 0940   HDL 89 07/24/2015 0940   CHOLHDL 1.6 07/24/2015 0940   VLDL 15 07/24/2015 0940   LDLCALC 38 07/24/2015 0940    Assessment: Vannie was previously enrolled into the deferred arm of the START study. Subsequently, he was started on Stribild/DRV from the onset. Dr. Tommy Medal attempted to get an Archive Genotype in 2015 but they couldn't do it on that particular sample. The non-study genotype didn't show  any resistance.  We, of course don't get access to the study genotype.   He is in/out of jail frequently. Karn Pickler brought him in today to re-engage into care. He currently off of meds for several months. We are going to do labs today and bring him back in a couple of weeks to start therapy. Since ADAP didn't add Symtuza to formulary, we probably can just use Prezcobix/Descovy. Gave him the flu vaccine today.   Recommendations:  HIV labs today F/u pharmacy in 3 wks Flu vaccine  Onnie Boer, PharmD, BCPS, AAHIVP, CPP Clinical Infectious [place] for Infectious Disease 02/16/2017, 3:08 PM

## 2017-02-17 LAB — COMPLETE METABOLIC PANEL WITH GFR
AG RATIO: 1.5 (calc) (ref 1.0–2.5)
ALT: 19 U/L (ref 9–46)
AST: 26 U/L (ref 10–40)
Albumin: 4.1 g/dL (ref 3.6–5.1)
Alkaline phosphatase (APISO): 87 U/L (ref 40–115)
BUN: 10 mg/dL (ref 7–25)
CALCIUM: 9.5 mg/dL (ref 8.6–10.3)
CO2: 23 mmol/L (ref 20–32)
Chloride: 104 mmol/L (ref 98–110)
Creat: 0.99 mg/dL (ref 0.60–1.35)
GFR, EST AFRICAN AMERICAN: 106 mL/min/{1.73_m2} (ref >=60)
GFR, EST NON AFRICAN AMERICAN: 92 mL/min/{1.73_m2} (ref >=60)
Globulin: 2.7 g/dL (calc) (ref 1.9–3.7)
Glucose, Bld: 109 mg/dL — ABNORMAL HIGH (ref 65–99)
POTASSIUM: 4.2 mmol/L (ref 3.5–5.3)
Sodium: 140 mmol/L (ref 135–146)
TOTAL PROTEIN: 6.8 g/dL (ref 6.1–8.1)
Total Bilirubin: 0.5 mg/dL (ref 0.2–1.2)

## 2017-02-17 LAB — URINE CYTOLOGY ANCILLARY ONLY
Chlamydia: NEGATIVE
Neisseria Gonorrhea: NEGATIVE

## 2017-02-17 LAB — CBC WITH DIFFERENTIAL/PLATELET
BASOS ABS: 18 {cells}/uL (ref 0–200)
Basophils Relative: 0.4 %
Eosinophils Absolute: 179 cells/uL (ref 15–500)
Eosinophils Relative: 3.9 %
HEMATOCRIT: 39.9 % (ref 38.5–50.0)
HEMOGLOBIN: 14.2 g/dL (ref 13.2–17.1)
Lymphs Abs: 1987 cells/uL (ref 850–3900)
MCH: 33.4 pg — ABNORMAL HIGH (ref 27.0–33.0)
MCHC: 35.6 g/dL (ref 32.0–36.0)
MCV: 93.9 fL (ref 80.0–100.0)
MPV: 10.2 fL (ref 7.5–12.5)
Monocytes Relative: 12.6 %
NEUTROS ABS: 1835 {cells}/uL (ref 1500–7800)
Neutrophils Relative %: 39.9 %
Platelets: 317 10*3/uL (ref 140–400)
RBC: 4.25 10*6/uL (ref 4.20–5.80)
RDW: 12 % (ref 11.0–15.0)
Total Lymphocyte: 43.2 %
WBC: 4.6 10*3/uL (ref 3.8–10.8)
WBCMIX: 580 {cells}/uL (ref 200–950)

## 2017-02-17 LAB — URIC ACID: URIC ACID, SERUM: 9.6 mg/dL — AB (ref 4.0–8.0)

## 2017-02-18 LAB — T-HELPER CELL (CD4) - (RCID CLINIC ONLY)
CD4 T CELL HELPER: 34 % (ref 33–55)
CD4 T Cell Abs: 820 /uL (ref 400–2700)

## 2017-02-21 LAB — HIV RNA, RTPCR W/R GT (RTI, PI,INT)
HIV 1 RNA Quant: <20 copies/mL
HIV-1 RNA Quant, Log: <1.3 Log copies/mL

## 2017-03-03 ENCOUNTER — Other Ambulatory Visit (INDEPENDENT_AMBULATORY_CARE_PROVIDER_SITE_OTHER): Payer: Self-pay | Admitting: Pharmacist Clinician (PhC)/ Clinical Pharmacy Specialist

## 2017-03-03 DIAGNOSIS — B20 Human immunodeficiency virus [HIV] disease: Secondary | ICD-10-CM

## 2017-03-03 DIAGNOSIS — Z23 Encounter for immunization: Secondary | ICD-10-CM

## 2017-03-03 MED ORDER — EMTRICITABINE-TENOFOVIR AF 200-25 MG PO TABS: 1.0000 | ORAL_TABLET | Freq: Every day | ORAL | 5 refills | Status: DC

## 2017-03-03 MED ORDER — DARUNAVIR-COBICISTAT 800-150 MG PO TABS: 1.0000 | ORAL_TABLET | Freq: Every day | ORAL | 5 refills | Status: DC

## 2017-03-03 NOTE — Progress Notes (Signed)
HPI: John [name] is a 46 y.o. male who was brought by Karn Pickler today to restart meds.   Allergies: Allergies  Allergen Reactions  . Shellfish Allergy Anaphylaxis and Swelling  . Shellfish Allergy Anaphylaxis    Vitals:    Past Medical History: Past Medical History:  Diagnosis Date  . Alcohol abuse   . Alcoholism ([place])   . Anxiety   . Asthma   . Asthma   . Bipolar disorder ([place])   . Chronic low back pain   . Cocaine abuse ([place])   . Depression   . Gout   . Gout   . HIV (human immunodeficiency virus infection) ([place])    "dx'd ~ 2 yr ago" (09/29/2012)  . HIV (human immunodeficiency virus infection) ([place])   . HIV disease ([place])   . Homelessness   . Hypertension   . Marijuana abuse   . Mental disorder   . Schizophrenia [facility])     Social History: Social History   Socioeconomic History  . Marital status: Single    Spouse name: Not on file  . Number of children: Not on file  . Years of education: Not on file  . Highest education level: Not on file  Social Needs  . Financial resource strain: Not on file  . Food insecurity - worry: Not on file  . Food insecurity - inability: Not on file  . Transportation needs - medical: Not on file  . Transportation needs - non-medical: Not on file  Occupational History  . Not on file  Tobacco Use  . Smoking status: Current Every Day Smoker    Packs/day: 0.50    Years: 27.00    Pack years: 13.50    Types: Cigarettes  . Smokeless tobacco: Never Used  . Tobacco comment: cutting back  Substance and Sexual Activity  . Alcohol use: Yes    Comment: quit 06/2015  . Drug use: Yes    Types: Cocaine, Marijuana  . Sexual activity: No    Comment: pt. given condoms  Other Topics Concern  . Not on file  Social History Narrative   ** Merged History Encounter **       ** Merged History Encounter **        Previous Regimen: Start study>>Stribild/Genvoya-DRV  Current Regimen: None  Labs: HIV 1 RNA Quant (copies/mL)  Date Value   02/16/2017 <20 NOT DETECTED  07/24/2015 <20  02/27/2015 <20   CD4 (no units)  Date Value  07/24/2015 948  02/27/2015 503  08/23/2014 515   CD4 T Cell Abs (/uL)  Date Value  02/16/2017 820  09/07/2016 750  02/12/2015 460   Hep B S Ab (no units)  Date Value  04/08/2010 NEG   Hepatitis B Surface Ag (no units)  Date Value  11/14/2013 NEGATIVE   HCV Ab (no units)  Date Value  11/14/2013 NEGATIVE    CrCl: Estimated Creatinine Clearance: 94.2 mL/min (by C-G formula based on SCr of 0.99 mg/dL).  Lipids:    Component Value Date/Time   CHOL 142 07/24/2015 0940   TRIG 73 07/24/2015 0940   HDL 89 07/24/2015 0940   CHOLHDL 1.6 07/24/2015 0940   VLDL 15 07/24/2015 0940   LDLCALC 38 07/24/2015 0940    Assessment: [name] was supposed to see pharmacy again next week to restart his ART. We did labs about 2 wks ago to evaluate his resistance. It came back <20 even though he is not on therapy. Obviously, he is not an Elite controller because he  has VL before. He was on Genvoya/DRV before. DRV was basically use as an intensification. We will restart him back on ART with Prezcobix/Descovy. Both have been sent to [facility]. His ADAP has been approved. We will cancel his pharmacy appt next week and schedule a f/u with Dr. Baxter Flattery in a month. We can repeat his labs then.   Gave him the first dose of Menveo today.   Recommendations:  Menveo #1 Start Prezcobix 1 PO qday Start Descovy 1 PO qday F/u with Dr. Baxter Flattery next month  Onnie Boer, PharmD, BCPS, AAHIVP, CPP Clinical Infectious Disease Pharmacist [place] for Infectious Disease 03/03/2017, 10:51 AM

## 2017-03-09 ENCOUNTER — Ambulatory Visit: Payer: Self-pay

## 2017-04-01 ENCOUNTER — Telehealth: Payer: Self-pay

## 2017-04-01 NOTE — Telephone Encounter (Signed)
Patient is working with Sales executive with [facility].    He has new medications which I added to med list today .   Laverle Patter, RN

## 2017-04-14 ENCOUNTER — Encounter: Payer: Self-pay | Admitting: Internal Medicine

## 2017-04-14 ENCOUNTER — Ambulatory Visit (INDEPENDENT_AMBULATORY_CARE_PROVIDER_SITE_OTHER): Payer: Self-pay | Admitting: Internal Medicine

## 2017-04-14 VITALS — BP 102/70 | HR 76 | Temp 98.3°F | Wt 176.0 lb

## 2017-04-14 DIAGNOSIS — B20 Human immunodeficiency virus [HIV] disease: Secondary | ICD-10-CM

## 2017-04-14 DIAGNOSIS — J189 Pneumonia, unspecified organism: Secondary | ICD-10-CM

## 2017-04-14 DIAGNOSIS — F319 Bipolar disorder, unspecified: Secondary | ICD-10-CM

## 2017-04-14 MED ORDER — AZITHROMYCIN 250 MG PO TABS: ORAL_TABLET | ORAL | 0 refills | Status: DC

## 2017-04-14 MED ORDER — ALBUTEROL SULFATE HFA 108 (90 BASE) MCG/ACT IN AERS: 2.0000 | INHALATION_SPRAY | RESPIRATORY_TRACT | 0 refills | Status: DC | PRN
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
## 2017-05-04 ENCOUNTER — Encounter: Payer: Self-pay | Admitting: Internal Medicine

## 2017-05-26 ENCOUNTER — Emergency Department (HOSPITAL_COMMUNITY)
Admission: EM | Admit: 2017-05-26 | Discharge: 2017-05-26 | Disposition: A | Discharge status: Home / Self Care | Payer: Self-pay | Attending: Emergency Medicine | Admitting: Emergency Medicine

## 2017-05-26 ENCOUNTER — Other Ambulatory Visit: Payer: Self-pay

## 2017-05-26 ENCOUNTER — Emergency Department (HOSPITAL_COMMUNITY): Payer: Self-pay

## 2017-05-26 ENCOUNTER — Emergency Department (HOSPITAL_COMMUNITY): Admission: EM | Admit: 2017-05-26 | Discharge: 2017-05-26 | Discharge status: Against Medical Advice | Payer: Self-pay

## 2017-05-26 ENCOUNTER — Encounter (HOSPITAL_COMMUNITY): Payer: Self-pay

## 2017-05-26 DIAGNOSIS — I10 Essential (primary) hypertension: Secondary | ICD-10-CM | POA: Insufficient documentation

## 2017-05-26 DIAGNOSIS — M10031 Idiopathic gout, right wrist: Secondary | ICD-10-CM | POA: Insufficient documentation

## 2017-05-26 DIAGNOSIS — Z79899 Other long term (current) drug therapy: Secondary | ICD-10-CM | POA: Insufficient documentation

## 2017-05-26 DIAGNOSIS — B20 Human immunodeficiency virus [HIV] disease: Secondary | ICD-10-CM | POA: Insufficient documentation

## 2017-05-26 DIAGNOSIS — J45909 Unspecified asthma, uncomplicated: Secondary | ICD-10-CM | POA: Insufficient documentation

## 2017-05-26 DIAGNOSIS — F17210 Nicotine dependence, cigarettes, uncomplicated: Secondary | ICD-10-CM | POA: Insufficient documentation

## 2017-05-26 MED ORDER — PREDNISONE 50 MG PO TABS: ORAL_TABLET | ORAL | 0 refills | Status: DC

## 2017-05-26 MED ORDER — OXYCODONE-ACETAMINOPHEN 5-325 MG PO TABS
1.0000 | ORAL_TABLET | ORAL | Status: DC | PRN
  Administered 2017-05-26: 1 via ORAL
  Filled 2017-05-26: qty 1

## 2017-05-26 MED ORDER — OXYCODONE-ACETAMINOPHEN 5-325 MG PO TABS: 1.0000 | ORAL_TABLET | ORAL | 0 refills | Status: DC | PRN

## 2017-05-26 MED ORDER — KETOROLAC TROMETHAMINE 60 MG/2ML IM SOLN: 30.0000 mg | Freq: Once | INTRAMUSCULAR | Status: DC

## 2017-05-26 MED ORDER — PREDNISONE 20 MG PO TABS
60.0000 mg | ORAL_TABLET | Freq: Once | ORAL | Status: AC
  Administered 2017-05-26: 60 mg via ORAL
  Filled 2017-05-26: qty 3

## 2017-05-26 MED ORDER — OXYCODONE-ACETAMINOPHEN 5-325 MG PO TABS
1.0000 | ORAL_TABLET | Freq: Once | ORAL | Status: AC
  Administered 2017-05-26: 1 via ORAL
  Filled 2017-05-26: qty 1
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
## 2017-05-26 NOTE — ED Triage Notes (Signed)
Called x 3 no answer 

## 2017-05-26 NOTE — ED Notes (Signed)
Pt is agitated, continues to request a provider. RN has been notified.

## 2017-05-26 NOTE — ED Triage Notes (Signed)
PT reports waking up this morning with right wrist swelling an pain with no known injury. Reports hx of gout ans states he took 3 gout pills today with no relief.

## 2017-05-26 NOTE — Discharge Instructions (Signed)
Follow up with your doctor to discuss further treatment for your gout.

## 2017-05-26 NOTE — ED Notes (Signed)
Pt departed in NAD, refused use of wheelchair. Given one bus pass.

## 2017-06-09 ENCOUNTER — Other Ambulatory Visit: Payer: Self-pay

## 2017-06-09 DIAGNOSIS — B20 Human immunodeficiency virus [HIV] disease: Secondary | ICD-10-CM

## 2017-06-09 LAB — BASIC METABOLIC PANEL
BUN: 10 mg/dL (ref 7–25)
CALCIUM: 9.1 mg/dL (ref 8.6–10.3)
CO2: 25 mmol/L (ref 20–32)
Chloride: 102 mmol/L (ref 98–110)
Creat: 0.94 mg/dL (ref 0.60–1.35)
Glucose, Bld: 132 mg/dL — ABNORMAL HIGH (ref 65–99)
POTASSIUM: 4.2 mmol/L (ref 3.5–5.3)
SODIUM: 138 mmol/L (ref 135–146)

## 2017-06-09 LAB — CBC WITH DIFFERENTIAL/PLATELET
BASOS ABS: 39 {cells}/uL (ref 0–200)
Basophils Relative: 0.7 %
Eosinophils Absolute: 246 cells/uL (ref 15–500)
Eosinophils Relative: 4.4 %
HEMATOCRIT: 41.5 % (ref 38.5–50.0)
HEMOGLOBIN: 14.7 g/dL (ref 13.2–17.1)
LYMPHS ABS: 1719 {cells}/uL (ref 850–3900)
MCH: 33.6 pg — ABNORMAL HIGH (ref 27.0–33.0)
MCHC: 35.4 g/dL (ref 32.0–36.0)
MCV: 94.7 fL (ref 80.0–100.0)
MPV: 9.2 fL (ref 7.5–12.5)
Monocytes Relative: 10.6 %
NEUTROS ABS: 3002 {cells}/uL (ref 1500–7800)
Neutrophils Relative %: 53.6 %
Platelets: 346 10*3/uL (ref 140–400)
RBC: 4.38 10*6/uL (ref 4.20–5.80)
RDW: 12.1 % (ref 11.0–15.0)
Total Lymphocyte: 30.7 %
WBC: 5.6 10*3/uL (ref 3.8–10.8)
WBCMIX: 594 {cells}/uL (ref 200–950)

## 2017-06-10 LAB — T-HELPER CELL (CD4) - (RCID CLINIC ONLY)
CD4 % Helper T Cell: 28 % — ABNORMAL LOW (ref 33–55)
CD4 T CELL ABS: 480 /uL (ref 400–2700)

## 2017-06-13 LAB — HIV-1 RNA QUANT-NO REFLEX-BLD
HIV 1 RNA Quant: <20 copies/mL
HIV-1 RNA Quant, Log: <1.3 Log copies/mL

## 2017-06-23 ENCOUNTER — Ambulatory Visit: Payer: Self-pay | Admitting: Internal Medicine

## 2017-07-14 ENCOUNTER — Ambulatory Visit (INDEPENDENT_AMBULATORY_CARE_PROVIDER_SITE_OTHER): Payer: Self-pay | Admitting: Internal Medicine

## 2017-07-14 ENCOUNTER — Encounter: Payer: Self-pay | Admitting: Internal Medicine

## 2017-07-14 VITALS — BP 106/74 | HR 101 | Temp 99.2°F | Wt 174.0 lb

## 2017-07-14 DIAGNOSIS — Z79899 Other long term (current) drug therapy: Secondary | ICD-10-CM

## 2017-07-14 DIAGNOSIS — B20 Human immunodeficiency virus [HIV] disease: Secondary | ICD-10-CM

## 2017-07-14 DIAGNOSIS — Z23 Encounter for immunization: Secondary | ICD-10-CM

## 2017-07-14 DIAGNOSIS — F1020 Alcohol dependence, uncomplicated: Secondary | ICD-10-CM

## 2017-07-14 MED ORDER — NALTREXONE HCL 50 MG PO TABS: 50.0000 mg | ORAL_TABLET | Freq: Every day | ORAL | 11 refills | Status: DC

## 2017-07-14 NOTE — Patient Instructions (Signed)
Come in 2 wk before next visit to get labs before your appt
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
## 2017-09-20 ENCOUNTER — Other Ambulatory Visit: Payer: Self-pay | Admitting: Internal Medicine

## 2017-09-21 ENCOUNTER — Ambulatory Visit: Payer: Self-pay

## 2017-10-03 ENCOUNTER — Other Ambulatory Visit: Payer: Self-pay

## 2017-10-03 DIAGNOSIS — B20 Human immunodeficiency virus [HIV] disease: Secondary | ICD-10-CM

## 2017-10-03 LAB — BASIC METABOLIC PANEL
BUN: 16 mg/dL (ref 7–25)
CO2: 26 mmol/L (ref 20–32)
Calcium: 9 mg/dL (ref 8.6–10.3)
Chloride: 106 mmol/L (ref 98–110)
Creat: 1.02 mg/dL (ref 0.60–1.35)
Glucose, Bld: 89 mg/dL (ref 65–99)
POTASSIUM: 4.4 mmol/L (ref 3.5–5.3)
Sodium: 140 mmol/L (ref 135–146)

## 2017-10-03 LAB — CBC WITH DIFFERENTIAL/PLATELET
Basophils Absolute: 30 cells/uL (ref 0–200)
Basophils Relative: 0.5 %
Eosinophils Absolute: 330 cells/uL (ref 15–500)
Eosinophils Relative: 5.6 %
HEMATOCRIT: 39.6 % (ref 38.5–50.0)
HEMOGLOBIN: 14 g/dL (ref 13.2–17.1)
LYMPHS ABS: 2738 {cells}/uL (ref 850–3900)
MCH: 32.5 pg (ref 27.0–33.0)
MCHC: 35.4 g/dL (ref 32.0–36.0)
MCV: 91.9 fL (ref 80.0–100.0)
MONOS PCT: 11.3 %
MPV: 9.8 fL (ref 7.5–12.5)
NEUTROS ABS: 2136 {cells}/uL (ref 1500–7800)
NEUTROS PCT: 36.2 %
Platelets: 323 10*3/uL (ref 140–400)
RBC: 4.31 10*6/uL (ref 4.20–5.80)
RDW: 11.7 % (ref 11.0–15.0)
Total Lymphocyte: 46.4 %
WBC mixed population: 667 cells/uL (ref 200–950)
WBC: 5.9 10*3/uL (ref 3.8–10.8)

## 2017-10-04 LAB — T-HELPER CELL (CD4) - (RCID CLINIC ONLY)
CD4 % Helper T Cell: 31 % — ABNORMAL LOW (ref 33–55)
CD4 T Cell Abs: 860 /uL (ref 400–2700)

## 2017-10-05 LAB — HIV-1 RNA QUANT-NO REFLEX-BLD
HIV 1 RNA QUANT: DETECTED {copies}/mL — AB
HIV-1 RNA Quant, Log: <1.3 Log copies/mL — AB

## 2017-10-11 ENCOUNTER — Encounter: Payer: Self-pay | Admitting: Internal Medicine

## 2017-10-17 ENCOUNTER — Encounter: Payer: Self-pay | Admitting: Internal Medicine

## 2017-10-17 ENCOUNTER — Other Ambulatory Visit: Payer: Self-pay

## 2017-10-17 ENCOUNTER — Ambulatory Visit (INDEPENDENT_AMBULATORY_CARE_PROVIDER_SITE_OTHER): Payer: Self-pay | Admitting: Internal Medicine

## 2017-10-17 VITALS — BP 121/76 | HR 61 | Temp 98.4°F | Ht 68.0 in | Wt 175.0 lb

## 2017-10-17 DIAGNOSIS — Z79899 Other long term (current) drug therapy: Secondary | ICD-10-CM

## 2017-10-17 DIAGNOSIS — F319 Bipolar disorder, unspecified: Secondary | ICD-10-CM

## 2017-10-17 DIAGNOSIS — Z23 Encounter for immunization: Secondary | ICD-10-CM

## 2017-10-17 DIAGNOSIS — B20 Human immunodeficiency virus [HIV] disease: Secondary | ICD-10-CM
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
## 2017-10-21 ENCOUNTER — Emergency Department (HOSPITAL_COMMUNITY)
Admission: EM | Admit: 2017-10-21 | Discharge: 2017-10-21 | Disposition: A | Discharge status: Home / Self Care | Payer: Self-pay | Attending: Emergency Medicine | Admitting: Emergency Medicine

## 2017-10-21 ENCOUNTER — Emergency Department (HOSPITAL_COMMUNITY): Payer: Self-pay

## 2017-10-21 ENCOUNTER — Encounter (HOSPITAL_COMMUNITY): Payer: Self-pay | Admitting: Emergency Medicine

## 2017-10-21 ENCOUNTER — Other Ambulatory Visit: Payer: Self-pay

## 2017-10-21 DIAGNOSIS — J45901 Unspecified asthma with (acute) exacerbation: Secondary | ICD-10-CM | POA: Insufficient documentation

## 2017-10-21 DIAGNOSIS — F17210 Nicotine dependence, cigarettes, uncomplicated: Secondary | ICD-10-CM | POA: Insufficient documentation

## 2017-10-21 DIAGNOSIS — R45850 Homicidal ideations: Secondary | ICD-10-CM | POA: Insufficient documentation

## 2017-10-21 DIAGNOSIS — F329 Major depressive disorder, single episode, unspecified: Secondary | ICD-10-CM | POA: Insufficient documentation

## 2017-10-21 DIAGNOSIS — R45851 Suicidal ideations: Secondary | ICD-10-CM | POA: Insufficient documentation

## 2017-10-21 DIAGNOSIS — Z79899 Other long term (current) drug therapy: Secondary | ICD-10-CM | POA: Insufficient documentation

## 2017-10-21 LAB — RAPID URINE DRUG SCREEN, HOSP PERFORMED
Amphetamines: NOT DETECTED
Barbiturates: NOT DETECTED
Benzodiazepines: NOT DETECTED
Cocaine: POSITIVE — AB
OPIATES: NOT DETECTED
TETRAHYDROCANNABINOL: POSITIVE — AB

## 2017-10-21 LAB — CBC
HCT: 39.5 % (ref 39.0–52.0)
HEMOGLOBIN: 13.2 g/dL (ref 13.0–17.0)
MCH: 32 pg (ref 26.0–34.0)
MCHC: 33.4 g/dL (ref 30.0–36.0)
MCV: 95.9 fL (ref 78.0–100.0)
Platelets: 305 10*3/uL (ref 150–400)
RBC: 4.12 MIL/uL — ABNORMAL LOW (ref 4.22–5.81)
RDW: 12.3 % (ref 11.5–15.5)
WBC: 7.2 10*3/uL (ref 4.0–10.5)

## 2017-10-21 LAB — ETHANOL: Alcohol, Ethyl (B): 60 mg/dL — ABNORMAL HIGH (ref <10)

## 2017-10-21 LAB — COMPREHENSIVE METABOLIC PANEL
ALK PHOS: 110 U/L (ref 38–126)
ALT: 16 U/L (ref 0–44)
AST: 20 U/L (ref 15–41)
Albumin: 3.9 g/dL (ref 3.5–5.0)
Anion gap: 18 — ABNORMAL HIGH (ref 5–15)
BUN: 12 mg/dL (ref 6–20)
CALCIUM: 9.1 mg/dL (ref 8.9–10.3)
CO2: 20 mmol/L — AB (ref 22–32)
CREATININE: 1.29 mg/dL — AB (ref 0.61–1.24)
Chloride: 105 mmol/L (ref 98–111)
GFR calc non Af Amer: >60 mL/min (ref >60)
Glucose, Bld: 109 mg/dL — ABNORMAL HIGH (ref 70–99)
Potassium: 4 mmol/L (ref 3.5–5.1)
Sodium: 143 mmol/L (ref 135–145)
TOTAL PROTEIN: 6.8 g/dL (ref 6.5–8.1)
Total Bilirubin: 0.6 mg/dL (ref 0.3–1.2)

## 2017-10-21 LAB — ACETAMINOPHEN LEVEL: Acetaminophen (Tylenol), Serum: <10 ug/mL — ABNORMAL LOW (ref 10–30)

## 2017-10-21 LAB — SALICYLATE LEVEL

## 2017-10-21 MED ORDER — ALBUTEROL SULFATE HFA 108 (90 BASE) MCG/ACT IN AERS
2.0000 | INHALATION_SPRAY | RESPIRATORY_TRACT | Status: DC | PRN
  Filled 2017-10-21: qty 6.7

## 2017-10-21 NOTE — Discharge Instructions (Signed)
Please use albuterol inhaler 2 puffs every 4 hours as needed for wheezing.  Your blood work and chest x-ray were reassuring today.  Please return to the emergency department if you have any new or concerning symptoms like wheezing or shortness of breath that does not improve with inhaler, chest pain, thoughts of hurting your self or having a plan to hurt others.

## 2017-10-21 NOTE — BH Assessment (Signed)
Tele Assessment Note   Patient Name: John [name] MRN: [id number] Referring Physician: Geanie Kenning, PA Location of Patient: [facility] Location of Provider: [place] [facility]  John [name] is a 47 y.o. male, in ED due to asthma exacerbation. He had a positive answer on the nursing questionnaire about SI and also endorsed HI.   Pt presents as slightly anxious, irritable and guarded. Pt reports that he has SI w/ no plan "every now and then". Pt also reports having random thoughts to harm people "sometimes". Pt denies active SI, HI, AVH currently. Pt initially reported that he hadn't used illegal substances in "a while", but when told that he tested positive for cocaine & THC, pt responded with "oh, okay". Pt reports going to [facility] for medication management but not having gone there for a few months and, as a result, has run out of his Seroquel and Trazodone. He reports running out @ 2-3 weeks ago. Pt cannot explain why he has not f/u with [name] concerning his medications running out.  Pt reports that he feels safe to be d/c and f/u with [facility] to get his meds restarted/refilled.   Case staffed with Marvia Pickles, NP, who also spoke with pt. Pt does not meet criteria for IP crisis psych treatment and is psychiatrically cleared. Pt's RN, [place], and PA, Raquel Sarna, notified of disposition.   Diagnosis:MDD  Past Medical History:  Past Medical History:  Diagnosis Date  . Alcohol abuse   . Alcoholism ([place])   . Anxiety   . Asthma   . Asthma   . Bipolar disorder ([place])   . Chronic low back pain   . Cocaine abuse ([place])   . Depression   . Gout   . Gout   . HIV (human immunodeficiency virus infection) ([place])    "dx'd ~ 2 yr ago" (09/29/2012)  . HIV (human immunodeficiency virus infection) ([place])   . HIV disease ([place])   . Homelessness   . Hypertension   . Marijuana abuse   . Mental disorder   . Schizophrenia [facility])     Past Surgical History:  Procedure Laterality Date  .  SKIN GRAFT FULL THICKNESS LEG Left ?   POSTERIOR LEFT LEG  AFTER DOG BITES    Family History:  Family History  Problem Relation Age of Onset  . Alcoholism Mother   . Depression Mother   . Alcoholism Brother     Social History:  reports that he has been smoking cigarettes. He has a 13.50 pack-year smoking history. He has never used smokeless tobacco. He reports that he drinks alcohol. He reports that he has current or past drug history. Drugs: , Cocaine, and Marijuana.  Additional Social History:  Alcohol / Drug Use Pain Medications: see MAR Prescriptions: see MAR Over the Counter: see MAR History of alcohol / drug use?: Yes(PT minimizes his drug and alcohol use. Tested positive for cocaine, THC and BAL was 60. )  CIWA: CIWA-Ar BP: 109/74 Pulse Rate: 94 COWS:    Allergies:  Allergies  Allergen Reactions  . Shellfish Allergy Anaphylaxis and Swelling  . Shellfish Allergy Anaphylaxis    Home Medications:  (Not in a hospital admission)  OB/GYN Status:  No LMP for male patient.  General Assessment Data Location of Assessment: [facility] [place] [facility] ED TTS Assessment: In system Is this a Tele or Face-to-Face Assessment?: Tele Assessment Is this an Initial Assessment or a Re-assessment for this encounter?: Initial Assessment Marital status: Single Living Arrangements: Alone Can pt return to current living arrangement?:  Yes Admission Status: Voluntary Is patient capable of signing voluntary admission?: Yes Referral Source: Self/Family/Friend Insurance type: none     Crisis Care Plan Living Arrangements: Alone Name of Psychiatrist: Beverly Sessions Name of Therapist: none  Education Status Is patient currently in school?: No Is the patient employed, unemployed or receiving disability?: Unemployed  Risk to self with the past 6 months Suicidal Ideation: No-Not Currently/Within Last 6 Months Has patient been a risk to self within the past 6 months prior to admission? : No Suicidal Intent:  No Has patient had any suicidal intent within the past 6 months prior to admission? : No Is patient at risk for suicide?: No Suicidal Plan?: No Has patient had any suicidal plan within the past 6 months prior to admission? : No Access to Means: No Previous Attempts/Gestures: Yes How many times?: 1 Triggers for Past Attempts: Unknown Intentional Self Injurious Behavior: None Family Suicide History: Unknown Recent stressful life event(s): Other (Comment) Persecutory voices/beliefs?: No Depression: No Substance abuse history and/or treatment for substance abuse?: Yes Suicide prevention information given to non-admitted patients: Yes  Risk to Others within the past 6 months Homicidal Ideation: No-Not Currently/Within Last 6 Months Does patient have any lifetime risk of violence toward others beyond the six months prior to admission? : Unknown Thoughts of Harm to Others: No-Not Currently Present/Within Last 6 Months Current Homicidal Intent: No Current Homicidal Plan: No Access to Homicidal Means: No History of harm to others?: No Assessment of Violence: None Noted Does patient have access to weapons?: No Criminal Charges Pending?: No Does patient have a court date: No Is patient on probation?: No  Psychosis Hallucinations: None noted Delusions: None noted  Mental Status Report Appearance/Hygiene: Unremarkable Eye Contact: Good Motor Activity: Agitation Speech: Logical/coherent Level of Consciousness: Alert, Irritable Mood: Anxious Affect: Anxious Anxiety Level: Moderate Thought Processes: Coherent, Relevant Judgement: Partial Orientation: Person, Place, Situation, Time Obsessive Compulsive Thoughts/Behaviors: None  Cognitive Functioning Concentration: Fair Memory: Unable to Assess Is patient IDD: No Is patient DD?: No Insight: Fair Impulse Control: Fair Appetite: Good Have you had any weight changes? : No Change Sleep: Decreased Total Hours of Sleep:  2 Vegetative Symptoms: None  ADLScreening [facility] Assessment Services) Patient's cognitive ability adequate to safely complete daily activities?: Yes Patient able to express need for assistance with ADLs?: Yes Independently performs ADLs?: Yes (appropriate for developmental age)  Prior Inpatient Therapy Prior Inpatient Therapy: Yes Prior Therapy Dates: 2013, 2014, 2015 (4xs), 2016 (2xs), 2017 Prior Therapy Facilty/Provider(s): [facility]; [place] Reason for Treatment: alcohol abuse, depression  Prior Outpatient Therapy Prior Outpatient Therapy: No Does patient have an ACCT team?: No Does patient have Intensive In-House Services?  : No Does patient have [facility] services? : Yes Does patient have P4CC services?: No  ADL Screening (condition at time of admission) Patient's cognitive ability adequate to safely complete daily activities?: Yes Is the patient deaf or have difficulty hearing?: No Does the patient have difficulty seeing, even when wearing glasses/contacts?: No Does the patient have difficulty concentrating, remembering, or making decisions?: No Patient able to express need for assistance with ADLs?: Yes Does the patient have difficulty dressing or bathing?: No Independently performs ADLs?: Yes (appropriate for developmental age) Does the patient have difficulty walking or climbing stairs?: No       Abuse/Neglect Assessment (Assessment to be complete while patient is alone) Abuse/Neglect Assessment Can Be Completed: Yes Physical Abuse: Denies Verbal Abuse: Denies Sexual Abuse: Denies Exploitation of patient/patient's resources: Denies Self-Neglect: Denies  Advance Directives (For Healthcare) Does Patient Have a Medical Advance Directive?: No Nutrition Screen- [facility] Adult/[facility]/AP Patient's home diet: Regular Has the patient recently lost weight without trying?: No Has the patient been eating poorly because of a decreased appetite?: No Malnutrition Screening Tool Score:  0        Disposition:  Disposition Initial Assessment Completed for this Encounter: Yes Patient referred to: Other (Comment)(f/u with [facility] to get meds refilled/managed)  This service was provided via telemedicine using a 2-way, interactive audio and video technology.  Names of all persons participating in this telemedicine service and their role in this encounter.   Rexene Edison 10/21/2017 10:23 AM

## 2017-10-21 NOTE — ED Triage Notes (Signed)
Patient with history of asthma, he has been out of his meds for the last month due to finances.  Patient has been given 10mg  albuterol/0.5mg  atrovent, patient with diminished lung sounds upon EMS arrival.  Patient feeling better after treatment from EMS.

## 2017-10-21 NOTE — Progress Notes (Addendum)
Patient is seen by me via tele-psych today and I have consulted with Dr. Parke Poisson.  Patient denies SI/HI/AVH and contracts for safety.  Patient states that he has continued to use cocaine and marijuana and does not want treatment for stopping.  Patient states that he has not followed up with his psychiatric care to continue his medications as he should.  Patient does not meet inpatient criteria and is psychiatrically cleared.   Marland Kitchen.Agree with NP Progress Note

## 2017-10-21 NOTE — ED Notes (Signed)
Pt wanded by security. 

## 2017-10-21 NOTE — ED Notes (Signed)
Pt changed in to burgundy scrubs; discussed and signed paperwork; belongings inventoried and valuables placed w/ security
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
## 2017-10-28 ENCOUNTER — Other Ambulatory Visit: Payer: Self-pay | Admitting: Internal Medicine

## 2018-01-03 ENCOUNTER — Other Ambulatory Visit: Payer: Self-pay

## 2018-01-03 DIAGNOSIS — B20 Human immunodeficiency virus [HIV] disease: Secondary | ICD-10-CM

## 2018-01-03 DIAGNOSIS — Z113 Encounter for screening for infections with a predominantly sexual mode of transmission: Secondary | ICD-10-CM

## 2018-01-03 DIAGNOSIS — Z79899 Other long term (current) drug therapy: Secondary | ICD-10-CM

## 2018-01-03 NOTE — Addendum Note (Signed)
Addended byMeriel Pica F on: 01/03/2018 10:50 AM   Modules accepted: Orders

## 2018-01-04 LAB — T-HELPER CELLS (CD4) COUNT (NOT AT ARMC)
CD4 % Helper T Cell: 32 % — ABNORMAL LOW (ref 33–55)
CD4 T CELL ABS: 930 /uL (ref 400–2700)

## 2018-01-05 LAB — CBC WITH DIFFERENTIAL/PLATELET
Basophils Absolute: 28 cells/uL (ref 0–200)
Basophils Relative: 0.6 %
Eosinophils Absolute: 259 cells/uL (ref 15–500)
Eosinophils Relative: 5.5 %
HCT: 41.3 % (ref 38.5–50.0)
Hemoglobin: 14.1 g/dL (ref 13.2–17.1)
Lymphs Abs: 2604 cells/uL (ref 850–3900)
MCH: 31.7 pg (ref 27.0–33.0)
MCHC: 34.1 g/dL (ref 32.0–36.0)
MCV: 92.8 fL (ref 80.0–100.0)
MONOS PCT: 14.2 %
MPV: 9.6 fL (ref 7.5–12.5)
NEUTROS PCT: 24.3 %
Neutro Abs: 1142 cells/uL — ABNORMAL LOW (ref 1500–7800)
PLATELETS: 299 10*3/uL (ref 140–400)
RBC: 4.45 10*6/uL (ref 4.20–5.80)
RDW: 12.9 % (ref 11.0–15.0)
TOTAL LYMPHOCYTE: 55.4 %
WBC: 4.7 10*3/uL (ref 3.8–10.8)
WBCMIX: 667 {cells}/uL (ref 200–950)

## 2018-01-05 LAB — HIV-1 RNA QUANT-NO REFLEX-BLD
HIV 1 RNA Quant: <20 copies/mL
HIV-1 RNA Quant, Log: <1.3 Log copies/mL

## 2018-01-05 LAB — LIPID PANEL
CHOL/HDL RATIO: 2.8 (calc) (ref <5.0)
CHOLESTEROL: 156 mg/dL (ref <200)
HDL: 55 mg/dL (ref >40)
LDL CHOLESTEROL (CALC): 83 mg/dL
NON-HDL CHOLESTEROL (CALC): 101 mg/dL (ref <130)
TRIGLYCERIDES: 89 mg/dL (ref <150)

## 2018-01-05 LAB — BASIC METABOLIC PANEL
BUN: 15 mg/dL (ref 7–25)
CALCIUM: 9 mg/dL (ref 8.6–10.3)
CHLORIDE: 105 mmol/L (ref 98–110)
CO2: 26 mmol/L (ref 20–32)
CREATININE: 0.91 mg/dL (ref 0.60–1.35)
Glucose, Bld: 91 mg/dL (ref 65–99)
POTASSIUM: 4.6 mmol/L (ref 3.5–5.3)
Sodium: 140 mmol/L (ref 135–146)

## 2018-01-05 LAB — RPR: RPR Ser Ql: NONREACTIVE

## 2018-01-17 ENCOUNTER — Ambulatory Visit (INDEPENDENT_AMBULATORY_CARE_PROVIDER_SITE_OTHER): Payer: Self-pay | Admitting: Internal Medicine

## 2018-01-17 ENCOUNTER — Encounter: Payer: Self-pay | Admitting: Internal Medicine

## 2018-01-17 VITALS — BP 113/75 | HR 66 | Temp 98.5°F | Wt 184.0 lb

## 2018-01-17 DIAGNOSIS — B20 Human immunodeficiency virus [HIV] disease: Secondary | ICD-10-CM

## 2018-01-17 DIAGNOSIS — Z79899 Other long term (current) drug therapy: Secondary | ICD-10-CM

## 2018-01-17 DIAGNOSIS — Z23 Encounter for immunization: Secondary | ICD-10-CM

## 2018-01-17 DIAGNOSIS — F319 Bipolar disorder, unspecified: Secondary | ICD-10-CM
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
## 2018-02-24 ENCOUNTER — Emergency Department (HOSPITAL_COMMUNITY)
Admission: EM | Admit: 2018-02-24 | Discharge: 2018-02-25 | Disposition: A | Discharge status: Home / Self Care | Payer: Self-pay | Attending: Emergency Medicine | Admitting: Emergency Medicine

## 2018-02-24 ENCOUNTER — Encounter (HOSPITAL_COMMUNITY): Payer: Self-pay | Admitting: Emergency Medicine

## 2018-02-24 DIAGNOSIS — R51 Headache: Secondary | ICD-10-CM | POA: Insufficient documentation

## 2018-02-24 DIAGNOSIS — S0083XA Contusion of other part of head, initial encounter: Secondary | ICD-10-CM | POA: Insufficient documentation

## 2018-02-24 DIAGNOSIS — S5011XA Contusion of right forearm, initial encounter: Secondary | ICD-10-CM | POA: Insufficient documentation

## 2018-02-24 DIAGNOSIS — Z79899 Other long term (current) drug therapy: Secondary | ICD-10-CM | POA: Insufficient documentation

## 2018-02-24 DIAGNOSIS — F17210 Nicotine dependence, cigarettes, uncomplicated: Secondary | ICD-10-CM | POA: Insufficient documentation

## 2018-02-24 DIAGNOSIS — J45909 Unspecified asthma, uncomplicated: Secondary | ICD-10-CM | POA: Insufficient documentation

## 2018-02-24 DIAGNOSIS — Y929 Unspecified place or not applicable: Secondary | ICD-10-CM | POA: Insufficient documentation

## 2018-02-24 DIAGNOSIS — Z21 Asymptomatic human immunodeficiency virus [HIV] infection status: Secondary | ICD-10-CM | POA: Insufficient documentation

## 2018-02-24 DIAGNOSIS — I10 Essential (primary) hypertension: Secondary | ICD-10-CM | POA: Insufficient documentation

## 2018-02-24 DIAGNOSIS — Y9389 Activity, other specified: Secondary | ICD-10-CM | POA: Insufficient documentation

## 2018-02-24 DIAGNOSIS — Y999 Unspecified external cause status: Secondary | ICD-10-CM | POA: Insufficient documentation
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
## 2018-02-25 MED ORDER — IBUPROFEN 800 MG PO TABS
800.0000 mg | ORAL_TABLET | Freq: Once | ORAL | Status: AC
  Administered 2018-02-25: 800 mg via ORAL
  Filled 2018-02-25: qty 1

## 2018-02-25 MED ORDER — HYDROCODONE-ACETAMINOPHEN 5-325 MG PO TABS
1.0000 | ORAL_TABLET | Freq: Once | ORAL | Status: AC
  Administered 2018-02-25: 1 via ORAL
  Filled 2018-02-25: qty 1
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
## 2018-02-25 NOTE — ED Notes (Signed)
Pt woken from sleep to get vital signs. Pt joined by his friend who was just discharged. Pt up pacing, cussing, and complaining about how much pain he is in. Pt assured we are working to get him a room to be seen by a doctor.

## 2018-03-27 ENCOUNTER — Other Ambulatory Visit: Payer: Self-pay | Admitting: Internal Medicine

## 2018-04-06 ENCOUNTER — Other Ambulatory Visit: Payer: Self-pay | Admitting: *Deleted

## 2018-04-06 ENCOUNTER — Other Ambulatory Visit: Payer: Self-pay

## 2018-04-06 DIAGNOSIS — B20 Human immunodeficiency virus [HIV] disease: Secondary | ICD-10-CM

## 2018-04-07 LAB — T-HELPER CELL (CD4) - (RCID CLINIC ONLY)
CD4 T CELL ABS: 590 /uL (ref 400–2700)
CD4 T CELL HELPER: 33 % (ref 33–55)

## 2018-04-11 LAB — COMPLETE METABOLIC PANEL WITH GFR
AG RATIO: 1.5 (calc) (ref 1.0–2.5)
ALT: 35 U/L (ref 9–46)
AST: 35 U/L (ref 10–40)
Albumin: 4 g/dL (ref 3.6–5.1)
Alkaline phosphatase (APISO): 71 U/L (ref 40–115)
BUN: 19 mg/dL (ref 7–25)
CALCIUM: 9.2 mg/dL (ref 8.6–10.3)
CO2: 22 mmol/L (ref 20–32)
CREATININE: 0.85 mg/dL (ref 0.60–1.35)
Chloride: 108 mmol/L (ref 98–110)
GFR, EST AFRICAN AMERICAN: 121 mL/min/{1.73_m2} (ref >=60)
GFR, EST NON AFRICAN AMERICAN: 104 mL/min/{1.73_m2} (ref >=60)
Globulin: 2.6 g/dL (calc) (ref 1.9–3.7)
Glucose, Bld: 105 mg/dL — ABNORMAL HIGH (ref 65–99)
Potassium: 4.4 mmol/L (ref 3.5–5.3)
Sodium: 141 mmol/L (ref 135–146)
TOTAL PROTEIN: 6.6 g/dL (ref 6.1–8.1)
Total Bilirubin: 0.3 mg/dL (ref 0.2–1.2)

## 2018-04-11 LAB — CBC WITH DIFFERENTIAL/PLATELET
Absolute Monocytes: 629 cells/uL (ref 200–950)
BASOS PCT: 0.9 %
Basophils Absolute: 40 cells/uL (ref 0–200)
EOS PCT: 6.1 %
Eosinophils Absolute: 268 cells/uL (ref 15–500)
HEMATOCRIT: 40.3 % (ref 38.5–50.0)
Hemoglobin: 14.3 g/dL (ref 13.2–17.1)
Lymphs Abs: 1760 cells/uL (ref 850–3900)
MCH: 34.1 pg — ABNORMAL HIGH (ref 27.0–33.0)
MCHC: 35.5 g/dL (ref 32.0–36.0)
MCV: 96.2 fL (ref 80.0–100.0)
MPV: 9.8 fL (ref 7.5–12.5)
Monocytes Relative: 14.3 %
NEUTROS PCT: 38.7 %
Neutro Abs: 1703 cells/uL (ref 1500–7800)
PLATELETS: 325 10*3/uL (ref 140–400)
RBC: 4.19 10*6/uL — AB (ref 4.20–5.80)
RDW: 13 % (ref 11.0–15.0)
Total Lymphocyte: 40 %
WBC: 4.4 10*3/uL (ref 3.8–10.8)

## 2018-04-11 LAB — HIV-1 RNA QUANT-NO REFLEX-BLD
HIV 1 RNA Quant: <20 copies/mL
HIV-1 RNA QUANT, LOG: NOT DETECTED {Log_copies}/mL

## 2018-04-19 ENCOUNTER — Ambulatory Visit (INDEPENDENT_AMBULATORY_CARE_PROVIDER_SITE_OTHER): Payer: Self-pay | Admitting: Internal Medicine

## 2018-04-19 ENCOUNTER — Encounter: Payer: Self-pay | Admitting: Internal Medicine

## 2018-04-19 VITALS — BP 126/84 | HR 77 | Temp 98.3°F | Wt 172.5 lb

## 2018-04-19 DIAGNOSIS — F319 Bipolar disorder, unspecified: Secondary | ICD-10-CM

## 2018-04-19 DIAGNOSIS — B20 Human immunodeficiency virus [HIV] disease: Secondary | ICD-10-CM

## 2018-04-19 DIAGNOSIS — Z79899 Other long term (current) drug therapy: Secondary | ICD-10-CM
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
## 2018-04-20 ENCOUNTER — Encounter: Payer: Self-pay | Admitting: Internal Medicine

## 2018-07-04 ENCOUNTER — Other Ambulatory Visit: Payer: Self-pay

## 2018-07-10 ENCOUNTER — Other Ambulatory Visit: Payer: Self-pay | Admitting: Internal Medicine

## 2018-07-18 ENCOUNTER — Telehealth: Payer: Self-pay | Admitting: *Deleted

## 2018-07-18 NOTE — Telephone Encounter (Signed)
Received call from patient's bridge counselor to let Dr Baxter Flattery know that the patient is currently incarcerated, court date today. Landis Gandy, RN

## 2018-07-19 ENCOUNTER — Encounter: Payer: Self-pay | Admitting: Internal Medicine

## 2018-09-03 ENCOUNTER — Emergency Department (HOSPITAL_COMMUNITY): Payer: Self-pay

## 2018-09-03 ENCOUNTER — Encounter (HOSPITAL_COMMUNITY): Payer: Self-pay | Admitting: Emergency Medicine

## 2018-09-03 ENCOUNTER — Emergency Department (HOSPITAL_COMMUNITY)
Admission: EM | Admit: 2018-09-03 | Discharge: 2018-09-03 | Disposition: A | Discharge status: Home / Self Care | Payer: Self-pay | Attending: Emergency Medicine | Admitting: Emergency Medicine

## 2018-09-03 ENCOUNTER — Other Ambulatory Visit: Payer: Self-pay

## 2018-09-03 DIAGNOSIS — Z21 Asymptomatic human immunodeficiency virus [HIV] infection status: Secondary | ICD-10-CM | POA: Insufficient documentation

## 2018-09-03 DIAGNOSIS — Z20828 Contact with and (suspected) exposure to other viral communicable diseases: Secondary | ICD-10-CM | POA: Insufficient documentation

## 2018-09-03 DIAGNOSIS — I10 Essential (primary) hypertension: Secondary | ICD-10-CM | POA: Insufficient documentation

## 2018-09-03 DIAGNOSIS — Z79899 Other long term (current) drug therapy: Secondary | ICD-10-CM | POA: Insufficient documentation

## 2018-09-03 DIAGNOSIS — Z590 Homelessness: Secondary | ICD-10-CM | POA: Insufficient documentation

## 2018-09-03 DIAGNOSIS — F17210 Nicotine dependence, cigarettes, uncomplicated: Secondary | ICD-10-CM | POA: Insufficient documentation

## 2018-09-03 DIAGNOSIS — J4541 Moderate persistent asthma with (acute) exacerbation: Secondary | ICD-10-CM | POA: Insufficient documentation

## 2018-09-03 LAB — CBC WITH DIFFERENTIAL/PLATELET
Abs Immature Granulocytes: 0.04 10*3/uL (ref 0.00–0.07)
Basophils Absolute: 0.1 10*3/uL (ref 0.0–0.1)
Basophils Relative: 1 %
Eosinophils Absolute: 0.3 10*3/uL (ref 0.0–0.5)
Eosinophils Relative: 3 %
HCT: 42.7 % (ref 39.0–52.0)
Hemoglobin: 13.8 g/dL (ref 13.0–17.0)
Immature Granulocytes: 1 %
Lymphocytes Relative: 60 %
Lymphs Abs: 5.2 10*3/uL — ABNORMAL HIGH (ref 0.7–4.0)
MCH: 32.2 pg (ref 26.0–34.0)
MCHC: 32.3 g/dL (ref 30.0–36.0)
MCV: 99.5 fL (ref 80.0–100.0)
Monocytes Absolute: 0.8 10*3/uL (ref 0.1–1.0)
Monocytes Relative: 10 %
Neutro Abs: 2.2 10*3/uL (ref 1.7–7.7)
Neutrophils Relative %: 25 %
Platelets: 409 10*3/uL — ABNORMAL HIGH (ref 150–400)
RBC: 4.29 MIL/uL (ref 4.22–5.81)
RDW: 13.7 % (ref 11.5–15.5)
WBC: 8.6 10*3/uL (ref 4.0–10.5)
nRBC: 0 % (ref 0.0–0.2)

## 2018-09-03 LAB — BASIC METABOLIC PANEL
Anion gap: 13 (ref 5–15)
BUN: 12 mg/dL (ref 6–20)
CO2: 23 mmol/L (ref 22–32)
Calcium: 8.7 mg/dL — ABNORMAL LOW (ref 8.9–10.3)
Chloride: 101 mmol/L (ref 98–111)
Creatinine, Ser: 1.1 mg/dL (ref 0.61–1.24)
GFR calc Af Amer: >60 mL/min (ref >60)
GFR calc non Af Amer: >60 mL/min (ref >60)
Glucose, Bld: 179 mg/dL — ABNORMAL HIGH (ref 70–99)
Potassium: 3.6 mmol/L (ref 3.5–5.1)
Sodium: 137 mmol/L (ref 135–145)

## 2018-09-03 LAB — SARS CORONAVIRUS 2 BY RT PCR (HOSPITAL ORDER, PERFORMED IN ~~LOC~~ HOSPITAL LAB): SARS Coronavirus 2: NEGATIVE

## 2018-09-03 LAB — ETHANOL: Alcohol, Ethyl (B): 121 mg/dL — ABNORMAL HIGH (ref <10)

## 2018-09-03 MED ORDER — ALBUTEROL SULFATE HFA 108 (90 BASE) MCG/ACT IN AERS
8.0000 | INHALATION_SPRAY | Freq: Once | RESPIRATORY_TRACT | Status: AC
Start: 2018-09-03 — End: 2018-09-03
  Administered 2018-09-03: 8 via RESPIRATORY_TRACT

## 2018-09-03 MED ORDER — PREDNISONE 50 MG PO TABS: ORAL_TABLET | ORAL | 0 refills | Status: DC

## 2018-09-03 NOTE — ED Notes (Signed)
Pt ambulated in hallway with stable gait. Oxygen @ 98% with ambulation. Pt given food and drink on return to bed.
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
## 2018-09-03 NOTE — ED Notes (Signed)
Patient verbalizes understanding of discharge instructions. Opportunity for questioning and answers were provided. Armband removed by staff, pt discharged from ED ambulatory.   

## 2018-09-03 NOTE — ED Notes (Signed)
Nurse will collect labs. 

## 2018-10-02 ENCOUNTER — Other Ambulatory Visit: Payer: Self-pay

## 2018-10-04 ENCOUNTER — Other Ambulatory Visit: Payer: Self-pay

## 2018-10-04 ENCOUNTER — Emergency Department (HOSPITAL_COMMUNITY)
Admission: EM | Admit: 2018-10-04 | Discharge: 2018-10-04 | Disposition: A | Discharge status: Home / Self Care | Payer: Self-pay | Attending: Emergency Medicine | Admitting: Emergency Medicine

## 2018-10-04 ENCOUNTER — Emergency Department (HOSPITAL_COMMUNITY): Payer: Self-pay

## 2018-10-04 DIAGNOSIS — F17210 Nicotine dependence, cigarettes, uncomplicated: Secondary | ICD-10-CM | POA: Insufficient documentation

## 2018-10-04 DIAGNOSIS — Z21 Asymptomatic human immunodeficiency virus [HIV] infection status: Secondary | ICD-10-CM | POA: Insufficient documentation

## 2018-10-04 DIAGNOSIS — I10 Essential (primary) hypertension: Secondary | ICD-10-CM | POA: Insufficient documentation

## 2018-10-04 DIAGNOSIS — Z79899 Other long term (current) drug therapy: Secondary | ICD-10-CM | POA: Insufficient documentation

## 2018-10-04 DIAGNOSIS — M10031 Idiopathic gout, right wrist: Secondary | ICD-10-CM | POA: Insufficient documentation

## 2018-10-04 DIAGNOSIS — J45909 Unspecified asthma, uncomplicated: Secondary | ICD-10-CM | POA: Insufficient documentation

## 2018-10-04 MED ORDER — NAPROXEN 250 MG PO TABS
500.0000 mg | ORAL_TABLET | Freq: Once | ORAL | Status: AC
  Administered 2018-10-04: 500 mg via ORAL
  Filled 2018-10-04: qty 2

## 2018-10-04 MED ORDER — PREDNISONE 20 MG PO TABS: 40.0000 mg | ORAL_TABLET | Freq: Every day | ORAL | 0 refills | Status: AC

## 2018-10-04 MED ORDER — OXYCODONE-ACETAMINOPHEN 5-325 MG PO TABS: 1.0000 | ORAL_TABLET | ORAL | 0 refills | Status: AC | PRN

## 2018-10-04 NOTE — ED Notes (Signed)
Pt transported to xray 

## 2018-10-04 NOTE — Discharge Instructions (Signed)
I have prescribed a short course of opioids to help with your pain, please take this for severe pain  I have also prescribed steroids which should help decrease the swelling and pain to your wrists, please take daily.  Please be aware that steroids can cause insomnia, appetite changes, flushness.  If you experience any fever, worsening symptoms please return to the emergency department.
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
## 2018-10-04 NOTE — ED Triage Notes (Signed)
Pt here for pain in right wrist. Does not take meds but has been dx with gout. Sts he has come here before "and they give me some pill that knocks the pain out."

## 2018-10-05 ENCOUNTER — Encounter: Payer: Self-pay | Admitting: Internal Medicine

## 2018-10-18 ENCOUNTER — Other Ambulatory Visit: Payer: Self-pay

## 2018-10-18 ENCOUNTER — Ambulatory Visit (INDEPENDENT_AMBULATORY_CARE_PROVIDER_SITE_OTHER): Payer: Self-pay | Admitting: Internal Medicine

## 2018-10-18 ENCOUNTER — Encounter: Payer: Self-pay | Admitting: Internal Medicine

## 2018-10-18 VITALS — BP 116/76 | HR 64 | Temp 98.7°F

## 2018-10-18 DIAGNOSIS — B20 Human immunodeficiency virus [HIV] disease: Secondary | ICD-10-CM

## 2018-10-18 DIAGNOSIS — Z79899 Other long term (current) drug therapy: Secondary | ICD-10-CM

## 2018-10-18 DIAGNOSIS — F319 Bipolar disorder, unspecified: Secondary | ICD-10-CM
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
## 2018-10-19 LAB — T-HELPER CELL (CD4) - (RCID CLINIC ONLY)
CD4 % Helper T Cell: 37 % (ref 33–65)
CD4 T Cell Abs: 574 /uL (ref 400–1790)

## 2018-10-25 LAB — COMPLETE METABOLIC PANEL WITH GFR
AG Ratio: 1.5 (calc) (ref 1.0–2.5)
ALT: 15 U/L (ref 9–46)
AST: 22 U/L (ref 10–40)
Albumin: 4.1 g/dL (ref 3.6–5.1)
Alkaline phosphatase (APISO): 62 U/L (ref 36–130)
BUN: 14 mg/dL (ref 7–25)
CO2: 24 mmol/L (ref 20–32)
Calcium: 9.6 mg/dL (ref 8.6–10.3)
Chloride: 105 mmol/L (ref 98–110)
Creat: 0.96 mg/dL (ref 0.60–1.35)
GFR, Est African American: 109 mL/min/{1.73_m2} (ref >=60)
GFR, Est Non African American: 94 mL/min/{1.73_m2} (ref >=60)
Globulin: 2.7 g/dL (calc) (ref 1.9–3.7)
Glucose, Bld: 83 mg/dL (ref 65–99)
Potassium: 4.7 mmol/L (ref 3.5–5.3)
Sodium: 140 mmol/L (ref 135–146)
Total Bilirubin: 0.6 mg/dL (ref 0.2–1.2)
Total Protein: 6.8 g/dL (ref 6.1–8.1)

## 2018-10-25 LAB — CBC WITH DIFFERENTIAL/PLATELET
Absolute Monocytes: 643 cells/uL (ref 200–950)
Basophils Absolute: 29 cells/uL (ref 0–200)
Basophils Relative: 0.6 %
Eosinophils Absolute: 101 cells/uL (ref 15–500)
Eosinophils Relative: 2.1 %
HCT: 45.4 % (ref 38.5–50.0)
Hemoglobin: 15.6 g/dL (ref 13.2–17.1)
Lymphs Abs: 1738 cells/uL (ref 850–3900)
MCH: 33.4 pg — ABNORMAL HIGH (ref 27.0–33.0)
MCHC: 34.4 g/dL (ref 32.0–36.0)
MCV: 97.2 fL (ref 80.0–100.0)
MPV: 9.5 fL (ref 7.5–12.5)
Monocytes Relative: 13.4 %
Neutro Abs: 2290 cells/uL (ref 1500–7800)
Neutrophils Relative %: 47.7 %
Platelets: 346 10*3/uL (ref 140–400)
RBC: 4.67 10*6/uL (ref 4.20–5.80)
RDW: 12.6 % (ref 11.0–15.0)
Total Lymphocyte: 36.2 %
WBC: 4.8 10*3/uL (ref 3.8–10.8)

## 2018-10-25 LAB — RPR: RPR Ser Ql: NONREACTIVE

## 2018-10-25 LAB — HIV-1 RNA QUANT-NO REFLEX-BLD
HIV 1 RNA Quant: <20 copies/mL
HIV-1 RNA Quant, Log: <1.3 Log copies/mL

## 2018-12-27 ENCOUNTER — Other Ambulatory Visit: Payer: Self-pay

## 2018-12-27 ENCOUNTER — Encounter (HOSPITAL_COMMUNITY): Payer: Self-pay | Admitting: *Deleted

## 2018-12-27 ENCOUNTER — Emergency Department (HOSPITAL_COMMUNITY)
Admission: EM | Admit: 2018-12-27 | Discharge: 2018-12-27 | Disposition: A | Discharge status: Home / Self Care | Payer: Self-pay | Attending: Emergency Medicine | Admitting: Emergency Medicine

## 2018-12-27 ENCOUNTER — Emergency Department (HOSPITAL_COMMUNITY): Payer: Self-pay

## 2018-12-27 DIAGNOSIS — Z79899 Other long term (current) drug therapy: Secondary | ICD-10-CM | POA: Insufficient documentation

## 2018-12-27 DIAGNOSIS — Z20828 Contact with and (suspected) exposure to other viral communicable diseases: Secondary | ICD-10-CM | POA: Insufficient documentation

## 2018-12-27 DIAGNOSIS — F250 Schizoaffective disorder, bipolar type: Secondary | ICD-10-CM | POA: Insufficient documentation

## 2018-12-27 DIAGNOSIS — B20 Human immunodeficiency virus [HIV] disease: Secondary | ICD-10-CM | POA: Insufficient documentation

## 2018-12-27 DIAGNOSIS — I10 Essential (primary) hypertension: Secondary | ICD-10-CM | POA: Insufficient documentation

## 2018-12-27 DIAGNOSIS — J45901 Unspecified asthma with (acute) exacerbation: Secondary | ICD-10-CM | POA: Insufficient documentation

## 2018-12-27 DIAGNOSIS — F17210 Nicotine dependence, cigarettes, uncomplicated: Secondary | ICD-10-CM | POA: Insufficient documentation

## 2018-12-27 LAB — BASIC METABOLIC PANEL
Anion gap: 14 (ref 5–15)
BUN: 10 mg/dL (ref 6–20)
CO2: 21 mmol/L — ABNORMAL LOW (ref 22–32)
Calcium: 9.1 mg/dL (ref 8.9–10.3)
Chloride: 105 mmol/L (ref 98–111)
Creatinine, Ser: 0.9 mg/dL (ref 0.61–1.24)
GFR calc Af Amer: >60 mL/min (ref >60)
GFR calc non Af Amer: >60 mL/min (ref >60)
Glucose, Bld: 107 mg/dL — ABNORMAL HIGH (ref 70–99)
Potassium: 4.1 mmol/L (ref 3.5–5.1)
Sodium: 140 mmol/L (ref 135–145)

## 2018-12-27 LAB — CBC
HCT: 44.4 % (ref 39.0–52.0)
Hemoglobin: 15.2 g/dL (ref 13.0–17.0)
MCH: 33.6 pg (ref 26.0–34.0)
MCHC: 34.2 g/dL (ref 30.0–36.0)
MCV: 98.2 fL (ref 80.0–100.0)
Platelets: 321 10*3/uL (ref 150–400)
RBC: 4.52 MIL/uL (ref 4.22–5.81)
RDW: 12.6 % (ref 11.5–15.5)
WBC: 5.3 10*3/uL (ref 4.0–10.5)
nRBC: 0 % (ref 0.0–0.2)

## 2018-12-27 LAB — SARS CORONAVIRUS 2 BY RT PCR (HOSPITAL ORDER, PERFORMED IN ~~LOC~~ HOSPITAL LAB): SARS Coronavirus 2: NEGATIVE

## 2018-12-27 MED ORDER — PREDNISONE 20 MG PO TABS: 40.0000 mg | ORAL_TABLET | Freq: Every day | ORAL | 0 refills | Status: DC

## 2018-12-27 MED ORDER — MAGNESIUM SULFATE 2 GM/50ML IV SOLN: 2.0000 g | Freq: Once | INTRAVENOUS | Status: DC

## 2018-12-27 MED ORDER — ALBUTEROL SULFATE HFA 108 (90 BASE) MCG/ACT IN AERS
8.0000 | INHALATION_SPRAY | Freq: Once | RESPIRATORY_TRACT | Status: AC
  Administered 2018-12-27: 8 via RESPIRATORY_TRACT

## 2018-12-27 MED ORDER — METHYLPREDNISOLONE SODIUM SUCC 125 MG IJ SOLR
125.0000 mg | Freq: Once | INTRAMUSCULAR | Status: AC
  Administered 2018-12-27: 125 mg via INTRAVENOUS
  Filled 2018-12-27: qty 2

## 2018-12-27 MED ORDER — ALBUTEROL SULFATE HFA 108 (90 BASE) MCG/ACT IN AERS
INHALATION_SPRAY | RESPIRATORY_TRACT | Status: AC
  Filled 2018-12-27: qty 6.7

## 2018-12-27 NOTE — ED Triage Notes (Signed)
Pt states he felt fine last night and he began feeling sob upon walking to the bus depo.  RR about 40, breathing labored.

## 2018-12-27 NOTE — ED Notes (Signed)
Pt in no respiratory distress.  Stable enough for hallway.
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
## 2018-12-27 NOTE — ED Notes (Signed)
Pt improving immediately after taking albuterol.

## 2018-12-29 ENCOUNTER — Emergency Department (HOSPITAL_COMMUNITY)
Admission: EM | Admit: 2018-12-29 | Discharge: 2018-12-29 | Disposition: A | Discharge status: Home / Self Care | Payer: Self-pay | Attending: Emergency Medicine | Admitting: Emergency Medicine

## 2018-12-29 ENCOUNTER — Other Ambulatory Visit: Payer: Self-pay

## 2018-12-29 ENCOUNTER — Encounter (HOSPITAL_COMMUNITY): Payer: Self-pay

## 2018-12-29 DIAGNOSIS — Z5321 Procedure and treatment not carried out due to patient leaving prior to being seen by health care provider: Secondary | ICD-10-CM | POA: Insufficient documentation

## 2018-12-29 DIAGNOSIS — F419 Anxiety disorder, unspecified: Secondary | ICD-10-CM | POA: Insufficient documentation

## 2018-12-29 NOTE — ED Notes (Signed)
Pt stated he feels much better after having time to cool down. Pt alert and ambulatory. No IV. Taking the bus home.

## 2018-12-29 NOTE — ED Provider Notes (Signed)
Patient left after being triaged and prior to being seen by myself.   Wyvonnia Dusky, MD 12/29/18 [phone]
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
## 2019-01-09 ENCOUNTER — Other Ambulatory Visit: Payer: Self-pay | Admitting: Internal Medicine

## 2019-01-22 ENCOUNTER — Ambulatory Visit (INDEPENDENT_AMBULATORY_CARE_PROVIDER_SITE_OTHER): Payer: Self-pay | Admitting: Internal Medicine

## 2019-01-22 ENCOUNTER — Other Ambulatory Visit: Payer: Self-pay

## 2019-01-22 ENCOUNTER — Encounter: Payer: Self-pay | Admitting: Internal Medicine

## 2019-01-22 VITALS — BP 106/66 | HR 67 | Wt 164.0 lb

## 2019-01-22 DIAGNOSIS — B20 Human immunodeficiency virus [HIV] disease: Secondary | ICD-10-CM

## 2019-01-22 DIAGNOSIS — Z23 Encounter for immunization: Secondary | ICD-10-CM

## 2019-01-22 DIAGNOSIS — F319 Bipolar disorder, unspecified: Secondary | ICD-10-CM

## 2019-01-22 DIAGNOSIS — Z79899 Other long term (current) drug therapy: Secondary | ICD-10-CM

## 2019-01-22 MED ORDER — BUSPIRONE HCL 10 MG PO TABS: 10.0000 mg | ORAL_TABLET | Freq: Three times a day (TID) | ORAL | 11 refills | Status: DC

## 2019-01-22 MED ORDER — QUETIAPINE FUMARATE 25 MG PO TABS: 50.0000 mg | ORAL_TABLET | Freq: Two times a day (BID) | ORAL | 11 refills | Status: DC

## 2019-01-22 MED ORDER — TRAZODONE HCL 100 MG PO TABS: 100.0000 mg | ORAL_TABLET | Freq: Every day | ORAL | 11 refills | Status: DC

## 2019-01-22 MED ORDER — MIRTAZAPINE 30 MG PO TABS: 30.0000 mg | ORAL_TABLET | Freq: Every day | ORAL | 11 refills | Status: DC

## 2019-01-22 MED ORDER — HYDROXYZINE HCL 25 MG PO TABS: 25.0000 mg | ORAL_TABLET | Freq: Every day | ORAL | 11 refills | Status: DC
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
## 2019-01-25 LAB — HIV-1 RNA QUANT-NO REFLEX-BLD
HIV 1 RNA Quant: <20 copies/mL — AB
HIV-1 RNA Quant, Log: <1.3 Log copies/mL — AB

## 2019-01-25 LAB — COMPLETE METABOLIC PANEL WITH GFR
AG Ratio: 1.6 (calc) (ref 1.0–2.5)
ALT: 22 U/L (ref 9–46)
AST: 26 U/L (ref 10–40)
Albumin: 4.1 g/dL (ref 3.6–5.1)
Alkaline phosphatase (APISO): 61 U/L (ref 36–130)
BUN: 15 mg/dL (ref 7–25)
CO2: 25 mmol/L (ref 20–32)
Calcium: 9.2 mg/dL (ref 8.6–10.3)
Chloride: 103 mmol/L (ref 98–110)
Creat: 0.96 mg/dL (ref 0.60–1.35)
GFR, Est African American: 109 mL/min/{1.73_m2} (ref >=60)
GFR, Est Non African American: 94 mL/min/{1.73_m2} (ref >=60)
Globulin: 2.5 g/dL (calc) (ref 1.9–3.7)
Glucose, Bld: 88 mg/dL (ref 65–99)
Potassium: 5 mmol/L (ref 3.5–5.3)
Sodium: 140 mmol/L (ref 135–146)
Total Bilirubin: 0.6 mg/dL (ref 0.2–1.2)
Total Protein: 6.6 g/dL (ref 6.1–8.1)

## 2019-03-23 ENCOUNTER — Emergency Department (HOSPITAL_COMMUNITY)
Admission: EM | Admit: 2019-03-23 | Discharge: 2019-03-23 | Disposition: A | Discharge status: Other Acute Inpatient Hospital | Payer: Self-pay | Attending: Emergency Medicine | Admitting: Emergency Medicine

## 2019-03-23 ENCOUNTER — Other Ambulatory Visit: Payer: Self-pay

## 2019-03-23 ENCOUNTER — Encounter (HOSPITAL_COMMUNITY): Payer: Self-pay | Admitting: Student

## 2019-03-23 ENCOUNTER — Inpatient Hospital Stay (HOSPITAL_COMMUNITY)
Admission: AD | Admit: 2019-03-23 | Discharge: 2019-03-27 | DRG: 885 | Disposition: A | Discharge status: Home / Self Care | Payer: Federal, State, Local not specified - Other | Source: Intra-hospital | Attending: Psychiatry | Admitting: Psychiatry

## 2019-03-23 DIAGNOSIS — F17210 Nicotine dependence, cigarettes, uncomplicated: Secondary | ICD-10-CM | POA: Diagnosis present

## 2019-03-23 DIAGNOSIS — Z811 Family history of alcohol abuse and dependence: Secondary | ICD-10-CM

## 2019-03-23 DIAGNOSIS — F319 Bipolar disorder, unspecified: Principal | ICD-10-CM | POA: Diagnosis present

## 2019-03-23 DIAGNOSIS — F1910 Other psychoactive substance abuse, uncomplicated: Secondary | ICD-10-CM | POA: Diagnosis present

## 2019-03-23 DIAGNOSIS — Z818 Family history of other mental and behavioral disorders: Secondary | ICD-10-CM

## 2019-03-23 DIAGNOSIS — F333 Major depressive disorder, recurrent, severe with psychotic symptoms: Secondary | ICD-10-CM | POA: Diagnosis present

## 2019-03-23 DIAGNOSIS — F1220 Cannabis dependence, uncomplicated: Secondary | ICD-10-CM | POA: Diagnosis present

## 2019-03-23 DIAGNOSIS — J45909 Unspecified asthma, uncomplicated: Secondary | ICD-10-CM | POA: Diagnosis present

## 2019-03-23 DIAGNOSIS — I10 Essential (primary) hypertension: Secondary | ICD-10-CM | POA: Insufficient documentation

## 2019-03-23 DIAGNOSIS — R4586 Emotional lability: Secondary | ICD-10-CM | POA: Diagnosis present

## 2019-03-23 DIAGNOSIS — B20 Human immunodeficiency virus [HIV] disease: Secondary | ICD-10-CM | POA: Diagnosis present

## 2019-03-23 DIAGNOSIS — F1410 Cocaine abuse, uncomplicated: Secondary | ICD-10-CM | POA: Diagnosis present

## 2019-03-23 DIAGNOSIS — F209 Schizophrenia, unspecified: Secondary | ICD-10-CM | POA: Diagnosis present

## 2019-03-23 DIAGNOSIS — Z765 Malingerer [conscious simulation]: Secondary | ICD-10-CM

## 2019-03-23 DIAGNOSIS — Z590 Homelessness: Secondary | ICD-10-CM

## 2019-03-23 DIAGNOSIS — F1020 Alcohol dependence, uncomplicated: Secondary | ICD-10-CM | POA: Diagnosis present

## 2019-03-23 DIAGNOSIS — R45851 Suicidal ideations: Secondary | ICD-10-CM | POA: Insufficient documentation

## 2019-03-23 DIAGNOSIS — F411 Generalized anxiety disorder: Secondary | ICD-10-CM | POA: Diagnosis present

## 2019-03-23 DIAGNOSIS — Z9114 Patient's other noncompliance with medication regimen: Secondary | ICD-10-CM

## 2019-03-23 DIAGNOSIS — G4700 Insomnia, unspecified: Secondary | ICD-10-CM | POA: Diagnosis present

## 2019-03-23 DIAGNOSIS — Z21 Asymptomatic human immunodeficiency virus [HIV] infection status: Secondary | ICD-10-CM | POA: Diagnosis present

## 2019-03-23 DIAGNOSIS — Z20822 Contact with and (suspected) exposure to covid-19: Secondary | ICD-10-CM | POA: Insufficient documentation

## 2019-03-23 DIAGNOSIS — Z79899 Other long term (current) drug therapy: Secondary | ICD-10-CM | POA: Insufficient documentation

## 2019-03-23 LAB — CBC WITH DIFFERENTIAL/PLATELET
Abs Immature Granulocytes: 0.01 10*3/uL (ref 0.00–0.07)
Basophils Absolute: 0 10*3/uL (ref 0.0–0.1)
Basophils Relative: 1 %
Eosinophils Absolute: 0.1 10*3/uL (ref 0.0–0.5)
Eosinophils Relative: 2 %
HCT: 44.5 % (ref 39.0–52.0)
Hemoglobin: 15.3 g/dL (ref 13.0–17.0)
Immature Granulocytes: 0 %
Lymphocytes Relative: 42 %
Lymphs Abs: 1.8 10*3/uL (ref 0.7–4.0)
MCH: 34 pg (ref 26.0–34.0)
MCHC: 34.4 g/dL (ref 30.0–36.0)
MCV: 98.9 fL (ref 80.0–100.0)
Monocytes Absolute: 0.6 10*3/uL (ref 0.1–1.0)
Monocytes Relative: 15 %
Neutro Abs: 1.7 10*3/uL (ref 1.7–7.7)
Neutrophils Relative %: 40 %
Platelets: 269 10*3/uL (ref 150–400)
RBC: 4.5 MIL/uL (ref 4.22–5.81)
RDW: 12.1 % (ref 11.5–15.5)
WBC: 4.2 10*3/uL (ref 4.0–10.5)
nRBC: 0 % (ref 0.0–0.2)

## 2019-03-23 LAB — COMPREHENSIVE METABOLIC PANEL
ALT: 75 U/L — ABNORMAL HIGH (ref 0–44)
AST: 147 U/L — ABNORMAL HIGH (ref 15–41)
Albumin: 4.5 g/dL (ref 3.5–5.0)
Alkaline Phosphatase: 65 U/L (ref 38–126)
Anion gap: 13 (ref 5–15)
BUN: 12 mg/dL (ref 6–20)
CO2: 26 mmol/L (ref 22–32)
Calcium: 9.5 mg/dL (ref 8.9–10.3)
Chloride: 98 mmol/L (ref 98–111)
Creatinine, Ser: 0.92 mg/dL (ref 0.61–1.24)
GFR calc Af Amer: >60 mL/min (ref >60)
GFR calc non Af Amer: >60 mL/min (ref >60)
Glucose, Bld: 88 mg/dL (ref 70–99)
Potassium: 4.1 mmol/L (ref 3.5–5.1)
Sodium: 137 mmol/L (ref 135–145)
Total Bilirubin: 1.1 mg/dL (ref 0.3–1.2)
Total Protein: 7.4 g/dL (ref 6.5–8.1)

## 2019-03-23 LAB — RAPID URINE DRUG SCREEN, HOSP PERFORMED
Amphetamines: NOT DETECTED
Barbiturates: NOT DETECTED
Benzodiazepines: NOT DETECTED
Cocaine: POSITIVE — AB
Opiates: NOT DETECTED
Tetrahydrocannabinol: POSITIVE — AB

## 2019-03-23 LAB — ETHANOL: Alcohol, Ethyl (B): <10 mg/dL (ref <10)

## 2019-03-23 LAB — ACETAMINOPHEN LEVEL: Acetaminophen (Tylenol), Serum: <10 ug/mL — ABNORMAL LOW (ref 10–30)

## 2019-03-23 LAB — RESPIRATORY PANEL BY RT PCR (FLU A&B, COVID)
Influenza A by PCR: NEGATIVE
Influenza B by PCR: NEGATIVE
SARS Coronavirus 2 by RT PCR: NEGATIVE

## 2019-03-23 LAB — SALICYLATE LEVEL: Salicylate Lvl: <7 mg/dL — ABNORMAL LOW (ref 7.0–30.0)

## 2019-03-23 MED ORDER — EMTRICITABINE-TENOFOVIR AF 200-25 MG PO TABS
1.0000 | ORAL_TABLET | Freq: Every day | ORAL | Status: DC
  Administered 2019-03-24 – 2019-03-27 (×4): 1 via ORAL
  Filled 2019-03-23 (×5): qty 1

## 2019-03-23 MED ORDER — LORAZEPAM 2 MG/ML IJ SOLN: 0.0000 mg | Freq: Two times a day (BID) | INTRAMUSCULAR | Status: DC

## 2019-03-23 MED ORDER — THIAMINE HCL 100 MG PO TABS
100.0000 mg | ORAL_TABLET | Freq: Every day | ORAL | Status: DC
  Administered 2019-03-23: 14:00:00 100 mg via ORAL
  Filled 2019-03-23: qty 1

## 2019-03-23 MED ORDER — LORAZEPAM 1 MG PO TABS
1.0000 mg | ORAL_TABLET | Freq: Four times a day (QID) | ORAL | Status: AC | PRN
  Administered 2019-03-24: 1 mg via ORAL
  Filled 2019-03-23: qty 1

## 2019-03-23 MED ORDER — IBUPROFEN 200 MG PO TABS: 600.0000 mg | ORAL_TABLET | Freq: Three times a day (TID) | ORAL | Status: DC | PRN

## 2019-03-23 MED ORDER — ADULT MULTIVITAMIN W/MINERALS CH
1.0000 | ORAL_TABLET | Freq: Every day | ORAL | Status: DC
  Administered 2019-03-24 – 2019-03-27 (×4): 1 via ORAL
  Filled 2019-03-23 (×5): qty 1

## 2019-03-23 MED ORDER — DARUNAVIR-COBICISTAT 800-150 MG PO TABS
1.0000 | ORAL_TABLET | Freq: Every day | ORAL | Status: DC
  Filled 2019-03-23: qty 1

## 2019-03-23 MED ORDER — LOPERAMIDE HCL 2 MG PO CAPS: 2.0000 mg | ORAL_CAPSULE | ORAL | Status: AC | PRN

## 2019-03-23 MED ORDER — MAGNESIUM HYDROXIDE 400 MG/5ML PO SUSP: 30.0000 mL | Freq: Every day | ORAL | Status: DC | PRN

## 2019-03-23 MED ORDER — ALBUTEROL SULFATE HFA 108 (90 BASE) MCG/ACT IN AERS
2.0000 | INHALATION_SPRAY | RESPIRATORY_TRACT | Status: DC | PRN
  Administered 2019-03-27: 2 via RESPIRATORY_TRACT
  Filled 2019-03-23: qty 6.7

## 2019-03-23 MED ORDER — TRAZODONE HCL 100 MG PO TABS
100.0000 mg | ORAL_TABLET | Freq: Every day | ORAL | Status: DC
  Administered 2019-03-23: 100 mg via ORAL
  Filled 2019-03-23 (×4): qty 1

## 2019-03-23 MED ORDER — QUETIAPINE FUMARATE 50 MG PO TABS
50.0000 mg | ORAL_TABLET | Freq: Two times a day (BID) | ORAL | Status: DC
  Administered 2019-03-23 – 2019-03-24 (×3): 50 mg via ORAL
  Filled 2019-03-23 (×7): qty 1

## 2019-03-23 MED ORDER — LORAZEPAM 2 MG/ML IJ SOLN: 0.0000 mg | Freq: Four times a day (QID) | INTRAMUSCULAR | Status: DC

## 2019-03-23 MED ORDER — NICOTINE 21 MG/24HR TD PT24
21.0000 mg | MEDICATED_PATCH | Freq: Every day | TRANSDERMAL | Status: DC
  Administered 2019-03-23: 21 mg via TRANSDERMAL
  Filled 2019-03-23: qty 1

## 2019-03-23 MED ORDER — LORAZEPAM 1 MG PO TABS: 0.0000 mg | ORAL_TABLET | Freq: Two times a day (BID) | ORAL | Status: DC

## 2019-03-23 MED ORDER — EMTRICITABINE-TENOFOVIR AF 200-25 MG PO TABS
1.0000 | ORAL_TABLET | Freq: Every day | ORAL | Status: DC
  Filled 2019-03-23: qty 1

## 2019-03-23 MED ORDER — ACETAMINOPHEN 325 MG PO TABS: 650.0000 mg | ORAL_TABLET | Freq: Four times a day (QID) | ORAL | Status: DC | PRN

## 2019-03-23 MED ORDER — LORAZEPAM 1 MG PO TABS: 0.0000 mg | ORAL_TABLET | Freq: Four times a day (QID) | ORAL | Status: DC

## 2019-03-23 MED ORDER — THIAMINE HCL 100 MG PO TABS
100.0000 mg | ORAL_TABLET | Freq: Every day | ORAL | Status: DC
  Administered 2019-03-24 – 2019-03-27 (×4): 100 mg via ORAL
  Filled 2019-03-23 (×5): qty 1

## 2019-03-23 MED ORDER — HYDROXYZINE HCL 25 MG PO TABS
25.0000 mg | ORAL_TABLET | Freq: Three times a day (TID) | ORAL | Status: DC | PRN
  Administered 2019-03-23 – 2019-03-26 (×4): 25 mg via ORAL
  Filled 2019-03-23 (×4): qty 1
  Filled 2019-03-23: qty 10

## 2019-03-23 MED ORDER — ONDANSETRON 4 MG PO TBDP: 4.0000 mg | ORAL_TABLET | Freq: Four times a day (QID) | ORAL | Status: AC | PRN

## 2019-03-23 MED ORDER — ONDANSETRON HCL 4 MG PO TABS: 4.0000 mg | ORAL_TABLET | Freq: Three times a day (TID) | ORAL | Status: DC | PRN

## 2019-03-23 MED ORDER — ALBUTEROL SULFATE HFA 108 (90 BASE) MCG/ACT IN AERS: 2.0000 | INHALATION_SPRAY | RESPIRATORY_TRACT | Status: DC | PRN

## 2019-03-23 MED ORDER — ZOLPIDEM TARTRATE 5 MG PO TABS: 5.0000 mg | ORAL_TABLET | Freq: Every evening | ORAL | Status: DC | PRN

## 2019-03-23 MED ORDER — ALUM & MAG HYDROXIDE-SIMETH 200-200-20 MG/5ML PO SUSP: 30.0000 mL | ORAL | Status: DC | PRN

## 2019-03-23 MED ORDER — ALUM & MAG HYDROXIDE-SIMETH 200-200-20 MG/5ML PO SUSP: 30.0000 mL | Freq: Four times a day (QID) | ORAL | Status: DC | PRN

## 2019-03-23 MED ORDER — DARUNAVIR-COBICISTAT 800-150 MG PO TABS
1.0000 | ORAL_TABLET | Freq: Every day | ORAL | Status: DC
  Administered 2019-03-24 – 2019-03-27 (×4): 1 via ORAL
  Filled 2019-03-23 (×5): qty 1

## 2019-03-23 MED ORDER — HYDROXYZINE HCL 25 MG PO TABS: 25.0000 mg | ORAL_TABLET | Freq: Every day | ORAL | Status: DC

## 2019-03-23 MED ORDER — TRAZODONE HCL 50 MG PO TABS
50.0000 mg | ORAL_TABLET | Freq: Every evening | ORAL | Status: DC | PRN
  Administered 2019-03-24 – 2019-03-25 (×2): 50 mg via ORAL
  Filled 2019-03-23 (×2): qty 1

## 2019-03-23 MED ORDER — THIAMINE HCL 100 MG/ML IJ SOLN: 100.0000 mg | Freq: Every day | INTRAMUSCULAR | Status: DC

## 2019-03-23 NOTE — ED Notes (Signed)
Pt dressed out in purple scrubs, provided with [place] sandwich and sprite.
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
## 2019-03-23 NOTE — ED Notes (Signed)
[employer] presented IVC paperwork initiated by [facility].  This RN gave them to Margot Ables, Network engineer.

## 2019-03-23 NOTE — ED Notes (Signed)
Pt reports last drinking alcohol yesterday, smoking marijuana mixed with "maybe some crack."  Pt cooperative at this time, reports mainly wanting to hurt himself, also wants to hurt other people.  When asked what other people he wants to hurt, pt reports "I dont know, mainly myself but I'd hurt anyone." Will continue to monitor.

## 2019-03-23 NOTE — ED Notes (Signed)
Attempted report x1. 

## 2019-03-23 NOTE — BH Assessment (Signed)
Pt accepted to [facility] under the service of Dr. Johnn Hai, room 504-1.   Evelena Peat, [facility] Triage Specialist [phone]

## 2019-03-23 NOTE — BH Assessment (Signed)
[place] Assessment Progress Note  Per Letitia Libra, FNP, this pt requires psychiatric hospitalization.  She reports that pt will be assigned to an unspecified bed on the 500 hall.  Pt presents under IVC initiated by [facility] staff, and upheld by EDP Virgel Manifold, MD, and IVC documents have been faxed to [facility].  Pt's nurse, Johnette Abraham, has been notified, and agrees to call report to [phone].  Pt is to be transported via Event organiser when the time comes.   Jalene Mullet, [place] Coordinator [phone]

## 2019-03-23 NOTE — BH Assessment (Addendum)
Assessment Note  John [name] is an 49 y.o. male that presents this date with IVC. Per IVC patient had a plan to jump off a bridge. Patient states he went to [facility] earlier this date requesting to get back on mental health medications and after voicing his concerns a IVC was initiated after he voiced immediate plan to self harm. Patient has a noted history of HIV, bipolar disorder, anxiety, depression, homelessness and polysubstance abuse. Patient   states he stopped taking all of his medications about 1 month ago due to self medicating with alcohol and crack cocaine. Patient reports ongoing substance abuse to include: daily use of alcohol for the last six months reporting various amounts with last use prior to arrival when patient reported he drank "a few beers." Patient reports using crack cocaine two to three times a week stating he uses "20 dollars or more" with last use on 03/22/19 when he reported using "40 dollars worth". Patient denies any current withdrawals with UDS pending and BAL less than 5. Patient reports ongoing S/I over the last week with multiple stressors to include: current homelessness, excessive SA use and medication non-compliance. Patient denies any H/I or AH although reports active VH to include seeing "pink elephants." He reports this date thoughts of self harm with a plan to   jump off the [facility] or cut himself, no attempt made recently although he reports one prior attempt in the past. Per chart review patient was last seen on 10/21/17 when he presented with similar symptoms. Patient presents as slightly anxious, irritable and guarded. Patient renders conflicting history at times during the assessment. Patient reports going to [facility] for medication management since he ran out of his medication for depression two or three days ago and then reports it has been over a month since he has been on medication/s. Patient cannot explain why he has not followed up with [facility]  concerning his medications running out. Patient is oriented x 4 and speaks in a low soft voice. Patient isdressed in scrubs, alert and oriented. Motor behavior appears normal. Eye contact is good. Patient's mood isdepressedand affect is congruent with mood. Thought process is coherent at times and at other times disorganized. Case was staffed with Hall Busing NP who recommended a inpatient admission.    Diagnosis: F33.3 MDD recurrent with psychotic features, severe, Alcohol abuse, Cocaine use   Past Medical History:  Past Medical History:  Diagnosis Date  . Alcohol abuse   . Alcoholism ([place])   . Anxiety   . Asthma   . Asthma   . Bipolar disorder ([place])   . Chronic low back pain   . Cocaine abuse ([place])   . Depression   . Gout   . Gout   . HIV (human immunodeficiency virus infection) ([place])    "dx'd ~ 2 yr ago" (09/29/2012)  . HIV (human immunodeficiency virus infection) ([place])   . HIV disease ([place])   . Homelessness   . Hypertension   . Marijuana abuse   . Mental disorder   . Schizophrenia [facility])     Past Surgical History:  Procedure Laterality Date  . SKIN GRAFT FULL THICKNESS LEG Left ?   POSTERIOR LEFT LEG  AFTER DOG BITES    Family History:  Family History  Problem Relation Age of Onset  . Alcoholism Mother   . Depression Mother   . Alcoholism Brother     Social History:  reports that he has been smoking cigarettes. He has a 13.50  pack-year smoking history. He has never used smokeless tobacco. He reports current alcohol use. He reports current drug use. Drugs: , Cocaine, and Marijuana.  Additional Social History:  Alcohol / Drug Use Pain Medications: See MAR Prescriptions: See MAR Over the Counter: See MAR History of alcohol / drug use?: Yes Longest period of sobriety (when/how long): Unknown Negative Consequences of Use: (Denies) Withdrawal Symptoms: (Denies) Substance #1 Name of Substance 1: Cannabis 1 - Age of First Use: 20 1 - Amount (size/oz): Varies 1 -  Frequency: Varies 1 - Duration: Ongoing 1 - Last Use / Amount: 03/22/19 1 gram Substance #2 Name of Substance 2: Alcohol 2 - Age of First Use: 17 2 - Amount (size/oz): Varies 2 - Frequency: Varies 2 - Duration: Ongoing 2 - Last Use / Amount: 03/22/19 4 12  oz beers  CIWA: CIWA-Ar BP: 113/71 Pulse Rate: 90 Nausea and Vomiting: no nausea and no vomiting Tactile Disturbances: none Tremor: not visible, but can be felt fingertip to fingertip Auditory Disturbances: not present Paroxysmal Sweats: no sweat visible Visual Disturbances: very mild sensitivity Anxiety: mildly anxious Headache, Fullness in Head: none present Agitation: normal activity Orientation and Clouding of Sensorium: oriented and can do serial additions CIWA-Ar Total: 3 COWS:    Allergies:  Allergies  Allergen Reactions  . Shellfish Allergy Anaphylaxis and Swelling  . Shellfish Allergy Anaphylaxis    Home Medications: (Not in a hospital admission)   OB/GYN Status:  No LMP for male patient.  General Assessment Data Location of Assessment: [facility] ED TTS Assessment: In system Is this a Tele or Face-to-Face Assessment?: Face-to-Face Is this an Initial Assessment or a Re-assessment for this encounter?: Initial Assessment Patient Accompanied by:: N/A Language Other than English: No Living Arrangements: Other (Comment) What gender do you identify as?: Male Marital status: Single Living Arrangements: Alone Can pt return to current living arrangement?: Yes Admission Status: Involuntary Petitioner: Other([facility]) Is patient capable of signing voluntary admission?: Yes Referral Source: Self/Family/Friend Insurance type: SP  Medical Screening Exam ([place]) Medical Exam completed: Yes  Crisis Care Plan Living Arrangements: Alone Legal Guardian: (NA) Name of Psychiatrist: [place] Name of Therapist: None  Education Status Is patient currently in school?: No Is the patient employed, unemployed or  receiving disability?: Unemployed  Risk to self with the past 6 months Suicidal Ideation: Yes-Currently Present Has patient been a risk to self within the past 6 months prior to admission? : No Suicidal Intent: Yes-Currently Present Has patient had any suicidal intent within the past 6 months prior to admission? : No Is patient at risk for suicide?: Yes Suicidal Plan?: Yes-Currently Present Has patient had any suicidal plan within the past 6 months prior to admission? : No Specify Current Suicidal Plan: Jump off bridge Access to Means: Yes Specify Access to Suicidal Means: Jump off bridge What has been your use of drugs/alcohol within the last 12 months?: Current use Previous Attempts/Gestures: Yes How many times?: 1 Other Self Harm Risks: (Excessive SA use) Triggers for Past Attempts: Unknown Intentional Self Injurious Behavior: None Family Suicide History: No Recent stressful life event(s): Other (Comment)(Off medications) Persecutory voices/beliefs?: No Depression: Yes Depression Symptoms: Feeling worthless/self pity Substance abuse history and/or treatment for substance abuse?: No Suicide prevention information given to non-admitted patients: Not applicable  Risk to Others within the past 6 months Homicidal Ideation: No Does patient have any lifetime risk of violence toward others beyond the six months prior to admission? : No Thoughts of Harm to Others: No Current Homicidal  Intent: No Current Homicidal Plan: No Access to Homicidal Means: No Identified Victim: NA History of harm to others?: No Assessment of Violence: None Noted Violent Behavior Description: NA Does patient have access to weapons?: No Criminal Charges Pending?: No Does patient have a court date: No Is patient on probation?: No  Psychosis Hallucinations: Visual Delusions: None noted  Mental Status Report Appearance/Hygiene: In scrubs Eye Contact: Fair Motor Activity: Freedom of movement Speech:  Logical/coherent Level of Consciousness: Quiet/awake Mood: Depressed Affect: Appropriate to circumstance Anxiety Level: Minimal Thought Processes: Coherent, Relevant Judgement: Partial Orientation: Person, Place, Time Obsessive Compulsive Thoughts/Behaviors: None  Cognitive Functioning Concentration: Normal Memory: Recent Intact, Remote Intact Is patient IDD: No Insight: Fair Impulse Control: Fair Appetite: Good Have you had any weight changes? : No Change Sleep: No Change Total Hours of Sleep: 7 Vegetative Symptoms: None  ADLScreening [facility] Assessment Services) Patient's cognitive ability adequate to safely complete daily activities?: Yes Patient able to express need for assistance with ADLs?: Yes Independently performs ADLs?: Yes (appropriate for developmental age)  Prior Inpatient Therapy Prior Inpatient Therapy: Yes Prior Therapy Dates: 2020, 2019, 2018 Prior Therapy Facilty/Provider(s): , [facility] Reason for Treatment: MH issues  Prior Outpatient Therapy Prior Outpatient Therapy: Yes Prior Therapy Dates: Ongoing Prior Therapy Facilty/Provider(s): [facility] Reason for Treatment: med mang Does patient have an ACCT team?: No Does patient have Intensive In-House Services?  : No Does patient have [facility] services? : Yes Does patient have P4CC services?: No  ADL Screening (condition at time of admission) Patient's cognitive ability adequate to safely complete daily activities?: Yes Is the patient deaf or have difficulty hearing?: No Does the patient have difficulty seeing, even when wearing glasses/contacts?: No Does the patient have difficulty concentrating, remembering, or making decisions?: No Patient able to express need for assistance with ADLs?: Yes Does the patient have difficulty dressing or bathing?: No Independently performs ADLs?: Yes (appropriate for developmental age) Does the patient have difficulty walking or climbing stairs?: No Weakness of Legs:  None Weakness of Arms/Hands: None  Home Assistive Devices/Equipment Home Assistive Devices/Equipment: None  Therapy Consults (therapy consults require a physician order) PT Evaluation Needed: No OT Evalulation Needed: No SLP Evaluation Needed: No Abuse/Neglect Assessment (Assessment to be complete while patient is alone) Abuse/Neglect Assessment Can Be Completed: Yes Physical Abuse: Denies Verbal Abuse: Denies Sexual Abuse: Denies Exploitation of patient/patient's resources: Denies Self-Neglect: Denies Values / Beliefs Cultural Requests During Hospitalization: None Spiritual Requests During Hospitalization: None Consults Spiritual Care Consult Needed: No Transition of Care Team Consult Needed: No Advance Directives (For Healthcare) Does Patient Have a Medical Advance Directive?: No Would patient like information on creating a medical advance directive?: No - Patient declined          Disposition: Case was staffed with Hall Busing NP who recommended a inpatient admission.    Disposition Initial Assessment Completed for this Encounter: Yes  On Site Evaluation by:   Reviewed with Physician:    Mamie Nick 03/23/2019 1:47 PM

## 2019-03-23 NOTE — ED Notes (Signed)
Pt provided dinner tray.  Calm and cooperative at this time.

## 2019-03-23 NOTE — Progress Notes (Signed)
Pt in the search room and did not receive report on patient

## 2019-03-23 NOTE — BH Assessment (Signed)
[facility] Assessment Progress Note  Case was staffed with [place] NP who recommended a inpatient admission.     

## 2019-03-24 ENCOUNTER — Encounter (HOSPITAL_COMMUNITY): Payer: Self-pay | Admitting: Family

## 2019-03-24 DIAGNOSIS — F333 Major depressive disorder, recurrent, severe with psychotic symptoms: Secondary | ICD-10-CM

## 2019-03-24 DIAGNOSIS — F1220 Cannabis dependence, uncomplicated: Secondary | ICD-10-CM

## 2019-03-24 LAB — LIPID PANEL
Cholesterol: 139 mg/dL (ref 0–200)
HDL: 73 mg/dL (ref >40)
LDL Cholesterol: 43 mg/dL (ref 0–99)
Total CHOL/HDL Ratio: 1.9 RATIO
Triglycerides: 115 mg/dL (ref <150)
VLDL: 23 mg/dL (ref 0–40)

## 2019-03-24 LAB — TSH: TSH: 1.263 u[IU]/mL (ref 0.350–4.500)

## 2019-03-24 MED ORDER — QUETIAPINE FUMARATE 25 MG PO TABS
25.0000 mg | ORAL_TABLET | Freq: Two times a day (BID) | ORAL | Status: DC
  Administered 2019-03-25: 25 mg via ORAL
  Filled 2019-03-24 (×4): qty 1

## 2019-03-24 MED ORDER — ENSURE ENLIVE PO LIQD
237.0000 mL | Freq: Two times a day (BID) | ORAL | Status: DC
  Administered 2019-03-24 – 2019-03-25 (×4): 237 mL via ORAL

## 2019-03-24 NOTE — [facility] Group Notes (Signed)
[place] Group Notes: (Clinical Social Work)   03/24/2019      Type of Therapy:  Group Therapy   Participation Level:  Did Not Attend - was invited both individually by [facility] and by overhead announcement, chose not to attend.   Selmer Dominion, LCSW 03/24/2019, 5:13 PM
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
## 2019-03-24 NOTE — Tx Team (Signed)
Initial Treatment Plan 03/24/2019 12:41 AM John Hones [id number]    PATIENT STRESSORS: Health problems Medication change or noncompliance Substance abuse   PATIENT STRENGTHS: General fund of knowledge Motivation for treatment/growth   PATIENT IDENTIFIED PROBLEMS: Risk for suicide  SA  Psychosis  "get back on my meds, find long term treatment"               DISCHARGE CRITERIA:  Adequate post-discharge living arrangements Improved stabilization in mood, thinking, and/or behavior Verbal commitment to aftercare and medication compliance  PRELIMINARY DISCHARGE PLAN: Attend PHP/IOP Attend 12-step recovery group Outpatient therapy  PATIENT/FAMILY INVOLVEMENT: This treatment plan has been presented to and reviewed with the patient, John [name].  The patient and family have been given the opportunity to ask questions and make suggestions.  Providence Crosby, RN 03/24/2019, 12:41 AM

## 2019-03-24 NOTE — H&P (Addendum)
Psychiatric Admission Assessment Adult  Patient Identification: John [name] MRN:  [id number] Date of Evaluation:  03/24/2019 Chief Complaint:  Severe recurrent major depression with psychotic features ([place]) [F33.3] Principal Diagnosis: Severe recurrent major depression with psychotic features ([place]) Diagnosis:  Principal Problem:   Severe recurrent major depression with psychotic features ([place]) Active Problems:   HIV (human immunodeficiency virus infection) ([place])   Cannabis use disorder, moderate, dependence ([place])  History of Present Illness: Per [facility] Assessment 03/23/2019: John [name] is an 49 y.o. male that presents this date with IVC. Per IVC patient had a plan to jump off a bridge. Patient states he went to [facility] earlier this date requesting to get back on mental health medications and after voicing his concerns a IVC was initiated after he voiced immediate plan to self harm. Patient has a noted history of HIV, bipolar disorder, anxiety, depression, homelessness and polysubstance abuse. Patient  states he stopped taking all of his medications about 1 month ago due to self medicating with alcohol and crack cocaine. Patient reports ongoing substance abuse to include: daily use of alcohol for the last six months reporting various amounts with last use prior to arrival when patient reported he drank "a few beers." Patient reports using crack cocaine two to three times a week stating he uses "20 dollars or more" with last use on 03/22/19 when he reported using "40 dollars worth". Patient denies any current withdrawals with UDS pending and BAL less than 5. Patient reports ongoing S/I over the last week with multiple stressors to include: current homelessness, excessive SA use and medication non-compliance. Patient denies any H/I or AH although reports active VH to include seeing "pink elephants." He reports this date thoughts of self harm with a plan to   jump off the [facility] or cut  himself, no attempt made recently although he reports one prior attempt in the past. Per chart review patient was last seen on 10/21/17 when he presented with similar symptoms.  03/24/2019 On evaluation today: Chart reviewed and patient seen for face to face evaluation.  He collaborates information obtained in the [facility] assessment and continues to  endorse suicidal ideations with a plan to jump off a bridge. The patient has a medical history for HIV, bipolar disorder, anxiety, depression and polysubstance use.  He was last seen by our facility Aug 2019 when he presented with similar symptoms.  He cites multiple psychosocial stressors that include 1. Not taking his mental health medications; 2. Worsening audible and visual hallucinations; 3. Homelessness.  4. Sleep disturbances; 5. Decreased appetite; 6. Polysubstance abuse, all contributing to increased impulsivity, disturbed mood posing a moderate to high risk for safety.    He states he slept well last night, "the medications helped me sleep " prior to that unsure of when he last slept through the night. He states he had bacon, sausage, eggs, biscuit for breakfast but only at "25% of his meal.  He denies GI distrubances. He answers questions and his behavior with appropriate throughout the interview.  He remained calm and cooperative throughout.   Associated Signs/Symptoms: Depression Symptoms:  insomnia, suicidal thoughts with specific plan, disturbed sleep, decreased appetite, (Hypo) Manic Symptoms:  negative Anxiety Symptoms:  negative; patient rates anxiety 2/10 today. Psychotic Symptoms:  Hallucinations: Visual admitted with visual hallucinations but he denies at the time of assessment PTSD Symptoms: Negative Total Time spent with patient: 30 minutes  Past Psychiatric History: Bipolar disorder; anxiety,depression, polysubstance use disorder  Is the patient at risk  to self? Yes.    Has the patient been a risk to self in the past 6 months? Yes.     Has the patient been a risk to self within the distant past? No.  Is the patient a risk to others? No.  Has the patient been a risk to others in the past 6 months? No.  Has the patient been a risk to others within the distant past? No.   Prior Inpatient Therapy:   Prior Outpatient Therapy:    Alcohol Screening: 1. How often do you have a drink containing alcohol?: 4 or more times a week 2. How many drinks containing alcohol do you have on a typical day when you are drinking?: 7, 8, or 9 3. How often do you have six or more drinks on one occasion?: Weekly AUDIT-C Score: 10 4. How often during the last year have you found that you were not able to stop drinking once you had started?: Daily or almost daily 5. How often during the last year have you failed to do what was normally expected from you becasue of drinking?: Weekly 6. How often during the last year have you needed a first drink in the morning to get yourself going after a heavy drinking session?: Weekly 7. How often during the last year have you had a feeling of guilt of remorse after drinking?: Less than monthly 8. How often during the last year have you been unable to remember what happened the night before because you had been drinking?: Weekly 9. Have you or someone else been injured as a result of your drinking?: No 10. Has a relative or friend or a doctor or another health worker been concerned about your drinking or suggested you cut down?: Yes, during the last year Alcohol Use Disorder Identification Test Final Score (AUDIT): 28 Substance Abuse History in the last 12 months:  Yes.   Consequences of Substance Abuse: Medical and Family consequences Previous Psychotropic Medications: Yes  Psychological Evaluations: Yes  Past Medical History:  Past Medical History:  Diagnosis Date  . Alcohol abuse   . Alcoholism ([place])   . Anxiety   . Asthma   . Asthma   . Bipolar disorder ([place])   . Chronic low back pain   . Cocaine  abuse ([place])   . Depression   . Gout   . Gout   . HIV (human immunodeficiency virus infection) ([place])    "dx'd ~ 2 yr ago" (09/29/2012)  . HIV (human immunodeficiency virus infection) ([place])   . HIV disease ([place])   . Homelessness   . Hypertension   . Marijuana abuse   . Mental disorder   . Schizophrenia [facility])     Past Surgical History:  Procedure Laterality Date  . SKIN GRAFT FULL THICKNESS LEG Left ?   POSTERIOR LEFT LEG  AFTER DOG BITES   Family History:  Family History  Problem Relation Age of Onset  . Alcoholism Mother   . Depression Mother   . Alcoholism Brother    Family Psychiatric  History: unknown Tobacco Screening: Have you used any form of tobacco in the last 30 days? (Cigarettes, Smokeless Tobacco, Cigars, and/or Pipes): Yes Tobacco use, Select all that apply: 5 or more cigarettes per day Are you interested in Tobacco Cessation Medications?: No, patient refused Counseled patient on smoking cessation including recognizing danger situations, developing coping skills and basic information about quitting provided: Refused/Declined practical counseling Social History:  Social History   Substance and Sexual  Activity  Alcohol Use Yes   Comment: quit 06/2015     Social History   Substance and Sexual Activity  Drug Use Yes  . Types: Cocaine, Marijuana    Additional Social History:      Allergies:   Allergies  Allergen Reactions  . Shellfish Allergy Anaphylaxis and Swelling  . Shellfish Allergy Anaphylaxis   Lab Results:  Results for orders placed or performed during the hospital encounter of 03/23/19 (from the past 48 hour(s))  Urine rapid drug screen (hosp performed)     Status: Abnormal   Collection Time: 03/23/19 11:32 AM  Result Value Ref Range   Opiates NONE DETECTED NONE DETECTED   Cocaine POSITIVE (A) NONE DETECTED   Benzodiazepines NONE DETECTED NONE DETECTED   Amphetamines NONE DETECTED NONE DETECTED   Tetrahydrocannabinol POSITIVE (A) NONE  DETECTED   Barbiturates NONE DETECTED NONE DETECTED    Comment: (NOTE) DRUG SCREEN FOR MEDICAL PURPOSES ONLY.  IF CONFIRMATION IS NEEDED FOR ANY PURPOSE, NOTIFY LAB WITHIN 5 DAYS. LOWEST DETECTABLE LIMITS FOR URINE DRUG SCREEN Drug Class                     Cutoff (ng/mL) Amphetamine and metabolites    1000 Barbiturate and metabolites    200 Benzodiazepine                 937 Tricyclics and metabolites     300 Opiates and metabolites        300 Cocaine and metabolites        300 THC                            50 Performed at [facility], [place] [address], [place], [place] [address]   CBC with Differential     Status: None   Collection Time: 03/23/19 12:13 PM  Result Value Ref Range   WBC 4.2 4.0 - 10.5 K/uL   RBC 4.50 4.22 - 5.81 MIL/uL   Hemoglobin 15.3 13.0 - 17.0 g/dL   HCT 44.5 39.0 - 52.0 %   MCV 98.9 80.0 - 100.0 fL   MCH 34.0 26.0 - 34.0 pg   MCHC 34.4 30.0 - 36.0 g/dL   RDW 12.1 11.5 - 15.5 %   Platelets 269 150 - 400 K/uL   nRBC 0.0 0.0 - 0.2 %   Neutrophils Relative % 40 %   Neutro Abs 1.7 1.7 - 7.7 K/uL   Lymphocytes Relative 42 %   Lymphs Abs 1.8 0.7 - 4.0 K/uL   Monocytes Relative 15 %   Monocytes Absolute 0.6 0.1 - 1.0 K/uL   Eosinophils Relative 2 %   Eosinophils Absolute 0.1 0.0 - 0.5 K/uL   Basophils Relative 1 %   Basophils Absolute 0.0 0.0 - 0.1 K/uL   Immature Granulocytes 0 %   Abs Immature Granulocytes 0.01 0.00 - 0.07 K/uL    Comment: Performed at [facility], [place] [address], [place], [place] [address]  Comprehensive metabolic panel     Status: Abnormal   Collection Time: 03/23/19 12:13 PM  Result Value Ref Range   Sodium 137 135 - 145 mmol/L   Potassium 4.1 3.5 - 5.1 mmol/L   Chloride 98 98 - 111 mmol/L   CO2 26 22 - 32 mmol/L   Glucose, Bld 88 70 - 99 mg/dL   BUN 12 6 - 20 mg/dL   Creatinine, Ser 0.92 0.61 -  1.24 mg/dL   Calcium 9.5 8.9 - 10.3 mg/dL   Total Protein 7.4 6.5 - 8.1 g/dL   Albumin 4.5  3.5 - 5.0 g/dL   AST 147 (H) 15 - 41 U/L   ALT 75 (H) 0 - 44 U/L   Alkaline Phosphatase 65 38 - 126 U/L   Total Bilirubin 1.1 0.3 - 1.2 mg/dL   GFR calc non Af Amer >60 >60 mL/min   GFR calc Af Amer >60 >60 mL/min   Anion gap 13 5 - 15    Comment: Performed at [facility], [place] [address] [place], [place] [address]  Acetaminophen level     Status: Abnormal   Collection Time: 03/23/19 12:13 PM  Result Value Ref Range   Acetaminophen (Tylenol), Serum <10 (L) 10 - 30 ug/mL    Comment: (NOTE) Therapeutic concentrations vary significantly. A range of 10-30 ug/mL  may be an effective concentration for many patients. However, some  are best treated at concentrations outside of this range. Acetaminophen concentrations >150 ug/mL at 4 hours after ingestion  and >50 ug/mL at 12 hours after ingestion are often associated with  toxic reactions. Performed at [facility], [place] [address] [place], [place] [address]   Salicylate level     Status: Abnormal   Collection Time: 03/23/19 12:13 PM  Result Value Ref Range   Salicylate Lvl <8.2 (L) 7.0 - 30.0 mg/dL    Comment: Performed at [facility], [place] [address], [place], [place] [address]  Ethanol     Status: None   Collection Time: 03/23/19 12:13 PM  Result Value Ref Range   Alcohol, Ethyl (B) <10 <10 mg/dL    Comment: (NOTE) Lowest detectable limit for serum alcohol is 10 mg/dL. For medical purposes only. Performed at [facility], [place] [address], [place], [place] [address]   Respiratory Panel by RT PCR (Flu A&B, Covid) - Nasopharyngeal Swab     Status: None   Collection Time: 03/23/19  8:00 PM   Specimen: Nasopharyngeal Swab  Result Value Ref Range   SARS Coronavirus 2 by RT PCR NEGATIVE NEGATIVE    Comment: (NOTE) SARS-CoV-2 target nucleic acids are NOT DETECTED. The SARS-CoV-2 RNA is generally detectable in upper respiratoy specimens during the acute phase  of infection. The lowest concentration of SARS-CoV-2 viral copies this assay can detect is 131 copies/mL. A negative result does not preclude SARS-Cov-2 infection and should not be used as the sole basis for treatment or other patient management decisions. A negative result may occur with  improper specimen collection/handling, submission of specimen other than nasopharyngeal swab, presence of viral mutation(s) within the areas targeted by this assay, and inadequate number of viral copies (<131 copies/mL). A negative result must be combined with clinical observations, patient history, and epidemiological information. The expected result is Negative. Fact Sheet for Patients:  PinkCheek.be Fact Sheet for Healthcare Providers:  GravelBags.it This test is not yet ap proved or cleared by the [place] FDA and  has been authorized for detection and/or diagnosis of SARS-CoV-2 by FDA under an Emergency Use Authorization (EUA). This EUA will remain  in effect (meaning this test can be used) for the duration of the COVID-19 declaration under Section 564(b)(1) of the Act, 21 U.S.C. section 360bbb-3(b)(1), unless the authorization is terminated or revoked sooner.    Influenza A by PCR NEGATIVE NEGATIVE   Influenza B by PCR NEGATIVE NEGATIVE    Comment: (NOTE) The Xpert Xpress SARS-CoV-2/FLU/RSV  assay is intended as an aid in  the diagnosis of influenza from Nasopharyngeal swab specimens and  should not be used as a sole basis for treatment. Nasal washings and  aspirates are unacceptable for Xpert Xpress SARS-CoV-2/FLU/RSV  testing. Fact Sheet for Patients: PinkCheek.be Fact Sheet for Healthcare Providers: GravelBags.it This test is not yet approved or cleared by the [place] FDA and  has been authorized for detection and/or diagnosis of SARS-CoV-2 by  FDA under an  Emergency Use Authorization (EUA). This EUA will remain  in effect (meaning this test can be used) for the duration of the  Covid-19 declaration under Section 564(b)(1) of the Act, 21  U.S.C. section 360bbb-3(b)(1), unless the authorization is  terminated or revoked. Performed at [facility], [place] [address], [place], [place] [address]     Blood Alcohol level:  Lab Results  Component Value Date   ETH <10 03/23/2019   ETH 121 (H) 09/08/9483    Metabolic Disorder Labs:  Lab Results  Component Value Date   HGBA1C 5.2 11/18/2014   MPG 114 04/23/2014   MPG 114 01/11/2014   Lab Results  Component Value Date   PROLACTIN 6.7 09/29/2012   Lab Results  Component Value Date   CHOL 156 01/03/2018   TRIG 89 01/03/2018   HDL 55 01/03/2018   CHOLHDL 2.8 01/03/2018   VLDL 15 07/24/2015   LDLCALC 83 01/03/2018   LDLCALC 38 07/24/2015    Current Medications: Current Facility-Administered Medications  Medication Dose Route Frequency Provider Last Rate Last Admin  . acetaminophen (TYLENOL) tablet 650 mg  650 mg Oral Q6H PRN Lindon Romp A, NP      . albuterol (VENTOLIN HFA) 108 (90 Base) MCG/ACT inhaler 2 puff  2 puff Inhalation Q4H PRN Lindon Romp A, NP      . alum & mag hydroxide-simeth (MAALOX/MYLANTA) 200-200-20 MG/5ML suspension 30 mL  30 mL Oral Q4H PRN Lindon Romp A, NP      . darunavir-cobicistat (PREZCOBIX) 800-150 MG per tablet 1 tablet  1 tablet Oral Q breakfast Lindon Romp A, NP   1 tablet at 03/24/19 1004  . emtricitabine-tenofovir AF (DESCOVY) 200-25 MG per tablet 1 tablet  1 tablet Oral Daily Lindon Romp A, NP   1 tablet at 03/24/19 1004  . feeding supplement (ENSURE ENLIVE) (ENSURE ENLIVE) liquid 237 mL  237 mL Oral BID BM Lindon Romp A, NP   237 mL at 03/24/19 1005  . hydrOXYzine (ATARAX/VISTARIL) tablet 25 mg  25 mg Oral TID PRN Lindon Romp A, NP   25 mg at 03/23/19 2358  . loperamide (IMODIUM) capsule 2-4 mg  2-4 mg Oral PRN Lindon Romp A, NP       . LORazepam (ATIVAN) tablet 1 mg  1 mg Oral Q6H PRN Lindon Romp A, NP      . magnesium hydroxide (MILK OF MAGNESIA) suspension 30 mL  30 mL Oral Daily PRN Lindon Romp A, NP      . multivitamin with minerals tablet 1 tablet  1 tablet Oral Daily Lindon Romp A, NP   1 tablet at 03/24/19 1004  . ondansetron (ZOFRAN-ODT) disintegrating tablet 4 mg  4 mg Oral Q6H PRN Lindon Romp A, NP      . QUEtiapine (SEROQUEL) tablet 50 mg  50 mg Oral BID Lindon Romp A, NP   50 mg at 03/24/19 1004  . thiamine tablet 100 mg  100 mg Oral Daily Lindon Romp A, NP   100 mg at  03/24/19 1004  . traZODone (DESYREL) tablet 100 mg  100 mg Oral QHS Lindon Romp A, NP   100 mg at 03/23/19 2359  . traZODone (DESYREL) tablet 50 mg  50 mg Oral QHS PRN Rozetta Nunnery, NP       PTA Medications: Medications Prior to Admission  Medication Sig Dispense Refill Last Dose  . albuterol (PROVENTIL HFA;VENTOLIN HFA) 108 (90 Base) MCG/ACT inhaler Inhale 2 puffs into the lungs every 4 (four) hours as needed for wheezing or shortness of breath. 1 Inhaler 0   . busPIRone (BUSPAR) 10 MG tablet Take 1 tablet (10 mg total) by mouth 3 (three) times daily. 90 tablet 11   . DESCOVY 200-25 MG tablet TAKE 1 TABLET BY MOUTH DAILY (Patient taking differently: Take 1 tablet by mouth daily. ) 30 tablet 5   . hydrOXYzine (ATARAX/VISTARIL) 25 MG tablet Take 1 tablet (25 mg total) by mouth daily. 30 tablet 11   . mirtazapine (REMERON) 30 MG tablet Take 1 tablet (30 mg total) by mouth at bedtime. 30 tablet 11   . PREZCOBIX 800-150 MG tablet TAKE 1 TABLET BY MOUTH DAILY WITH BREAKFAST. SWALLOW WHOLE. DO NOT CRUSH, BREAK OR CHEW TABLETS.TAKE WITH FOOD (Patient taking differently: Take 1 tablet by mouth daily with breakfast. ) 30 tablet 5   . QUEtiapine (SEROQUEL) 25 MG tablet Take 2 tablets (50 mg total) by mouth 2 (two) times daily. Take two tablets in the morning and at night . 120 tablet 11   . traZODone (DESYREL) 100 MG tablet Take 1 tablet (100 mg  total) by mouth at bedtime. 30 tablet 11     Musculoskeletal: Strength & Muscle Tone: within normal limits Gait & Station: normal Patient leans: N/A  Psychiatric Specialty Exam: Physical Exam  Constitutional: He is oriented to person, place, and time. He appears well-developed.  HENT:  Head: Normocephalic.  Eyes: Pupils are equal, round, and reactive to light.  Cardiovascular: Normal rate.  Respiratory: Effort normal.  Musculoskeletal:        General: Normal range of motion.     Cervical back: Normal range of motion.  Neurological: He is alert and oriented to person, place, and time.    Review of Systems  Psychiatric/Behavioral: Positive for dysphoric mood and suicidal ideas (with plan to jump off a bridge or cut his wrist). Negative for agitation and confusion. The patient is not nervous/anxious and is not hyperactive.     Blood pressure (!) 121/94, pulse 62, temperature 97.7 F (36.5 C), resp. rate 18, height 5' 8" (1.727 m), weight 68.9 kg.Body mass index is 23.11 kg/m.  General Appearance: Casual and laying in bed with hospital scrubs  Eye Contact:  Good  Speech:  Clear and Coherent  Volume:  Normal  Mood:  Depressed and Dysphoric  Affect:  Congruent, Constricted and did offer a smile during the assessment  Thought Process:  Coherent and Descriptions of Associations: Intact  Orientation:  Full (Time, Place, and Person)  Thought Content:  Logical and Hallucinations: present on admission but denies at time of assess  Suicidal Thoughts:  Yes.  without intent/plan  Homicidal Thoughts:  No  Memory:  Immediate;   Fair Recent;   Fair Remote;   Fair  Judgement:  Impaired  Insight:  Fair  Psychomotor Activity:  Normal  Concentration:  Concentration: Fair and Attention Span: Fair  Recall:  [employer] of Knowledge:  Fair  Language:  Good  Akathisia:  Negative  Handed:  Right  AIMS (  if indicated):     Assets:  Communication Skills Desire for Improvement Resilience   ADL's:  Intact  Cognition:  WNL  Sleep:  Number of Hours: 3.75    Treatment Plan Summary: Daily contact with patient to assess and evaluate symptoms and progress in treatment and Medication management  Observation Level/Precautions:  15 minute checks  Laboratory:  labs previously completed; no additional labs at this time  Psychotherapy:    Medications:  As outlined above,continue  Consultations:  N/A  Discharge Concerns:  Mood stabilization; homelessness; sobriety  Estimated LOS: 2-4 days contingent on patient's response to medication and therapeutic miieu  Other:     Physician Treatment Plan for Primary Diagnosis: Severe recurrent major depression with psychotic features ([place]) Long Term Goal(s): Improvement in symptoms so as ready for discharge  Short Term Goals: Ability to identify changes in lifestyle to reduce recurrence of condition will improve, Ability to verbalize feelings will improve, Ability to disclose and discuss suicidal ideas, Ability to demonstrate self-control will improve, Ability to identify and develop effective coping behaviors will improve, Compliance with prescribed medications will improve and Ability to identify triggers associated with substance abuse/mental health issues will improve  Physician Treatment Plan for Secondary Diagnosis: Principal Problem:   Severe recurrent major depression with psychotic features ([place]) Active Problems:   HIV (human immunodeficiency virus infection) ([place])   Cannabis use disorder, moderate, dependence ([place])  Long Term Goal(s): Improvement in symptoms so as ready for discharge  Short Term Goals: Compliance with prescribed medications will improve and Ability to identify triggers associated with substance abuse/mental health issues will improve  I certify that inpatient services furnished can reasonably be expected to improve the patient's condition.    Mallie Darting, NP 1/9/202110:48 AM   I have discussed case with NP  and have met with patient  Agree with NP note and assessment  49 year old single male, unemployed, currently homeless. Presented to ED via EMS on 1/8.  Reports he had contacted police , was taken to [facility] and from there to ED. Reports recently worsening depression, with suicidal ideations described as thoughts of jumping off a bridge or cut himself.  Also endorses neurovegetative symptoms, mainly anhedonia, poor sleep, poor appetite.  Describes auditory hallucinations (voice telling him to kill himself) and as per chart notes also reported visual hallucination of "pink elephants" in ED.  Of note he does not appear internally preoccupied/no delusions are expressed/thought processes linear and well organized. Reports he had stopped taking all his medications a few weeks ago.  States "I guess I was just tired of taking them".  He also endorses alcohol/drug abuse.  States had been drinking beer daily in December but since [place] consumption escalated to up to 1/5 of liquor per day.  He reports regular cannabis use and recent use of cocaine, although denies any pattern of cocaine abuse. Admission BAL negative, UDS positive for Cocaine and Cannabis. Patient reports history of depression, mood lability, substance abuse.  As above describes history of psychotic symptoms.  Has a history of a prior psychiatric admission to [facility] in January 2017.  At that time presented for similar symptoms as above (worsening depression, anxiety, hallucinations, alcohol use disorder).  Was discharged on Depakote, Neurontin, Seroquel, and on Stribild and Prezista for HIV infection. Medical history is remarkable for HIV, for which she was taking Descovy/Prezcobix .  NKDA.  Denies history of withdrawal seizures or DTs -smokes 10 cigarettes/day. Home medications-BuSpar 10 mg 3 times daily, Vistaril 25 mg daily,  Remeron 30 mg nightly, Descovy, Prezcobix, Seroquel 50 mg twice daily, trazodone 100 mg nightly.  As noted patient reports  has not been taking his medications for a few weeks preceding this admission. 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 Thiamine, Folate. Resume Seroquel at 25 mgrs BID initially, titrate as tolerated . Trazodone 50 mgrs QHS PRN for insomnia.  I spoke with ID consultant , who recommended to resume Prezcobix/Descovy combination. Patient to follow up at ID clinic for further outpatient management following discharge.

## 2019-03-24 NOTE — Progress Notes (Signed)
Patient ID: John [name], male   DOB: 08-25-1970, 49 y.o.   MRN: [id number]  Admission Note:  D:48 yr male who presents IVC in no acute distress for the treatment of SI and Depression. Pt appears flat and depressed. Pt was calm and cooperative with admission process. Pt presents with passive SI/ HI and contracts for safety upon admission. Pt +ve AVH- pink elephants and black dinosaurs  . Pt experienced HI towards wife and other man on the morning of 01/05/12, but currently denies any HI . Pt stated he has been off his medications x 1 month and wanted to get back on his medications. Pt said he is homeless now, pt said he would like to find LT Tx facility. Pt said he does not ge along well with people and wanted to know if he could not have a roommate. Pt informed to talk to the doctor.    A:Skin was assessed and found to be clear of any abnormal marks apart from old scars-back, arms. PT searched and no contraband found, POC and unit policies explained and understanding verbalized. Consents obtained. Food and fluids offered, and  accepted.  R: Pt had no additional questions or concerns.

## 2019-03-24 NOTE — Progress Notes (Addendum)
Pt endorses SI with a plan to jump off a bridge. Pt endorses HI, no specific person identified by the pt. Pt verbally contracts for safety. Pt reports VH-seeing pink elephants. Pt denies AH. Pt reports improved sleep at bedtime.     03/24/19 1034  Psych Admission Type (Psych Patients Only)  Admission Status Involuntary  Psychosocial Assessment  Patient Complaints Anxiety;Depression  Eye Contact Fair  Facial Expression Flat  Affect Appropriate to circumstance  Speech Logical/coherent  Interaction Assertive  Motor Activity Restless  Appearance/Hygiene Disheveled  Behavior Characteristics Appropriate to situation  Mood Depressed;Anxious  Aggressive Behavior  Effect No apparent injury  Thought Process  Coherency WDL  Content WDL  Delusions None reported or observed  Perception Hallucinations  Hallucination Visual  Judgment Poor  Confusion None  Danger to Self  Current suicidal ideation? Passive  Self-Injurious Behavior Some self-injurious ideation observed or expressed.  No lethal plan expressed   Agreement Not to Harm Self Yes  Description of Agreement pt verbally contracts for safety  Danger to Others  Danger to Others Reported or observed  Danger to Others Abnormal  Harmful Behavior to others No threats or harm toward other people  Destructive Behavior No threats or harm toward property

## 2019-03-25 LAB — HEMOGLOBIN A1C
Hgb A1c MFr Bld: 5 % (ref 4.8–5.6)
Mean Plasma Glucose: 96.8 mg/dL

## 2019-03-25 MED ORDER — QUETIAPINE FUMARATE 50 MG PO TABS
50.0000 mg | ORAL_TABLET | Freq: Two times a day (BID) | ORAL | Status: DC
  Administered 2019-03-25 – 2019-03-27 (×4): 50 mg via ORAL
  Filled 2019-03-25: qty 14
  Filled 2019-03-25 (×6): qty 1

## 2019-03-25 MED ORDER — MIRTAZAPINE 15 MG PO TABS
15.0000 mg | ORAL_TABLET | Freq: Every day | ORAL | Status: DC
  Administered 2019-03-25 – 2019-03-26 (×2): 15 mg via ORAL
  Filled 2019-03-25 (×3): qty 1

## 2019-03-25 NOTE — BHH Group Notes (Addendum)
[facility] Group Notes:  (Nursing/MHT/Case Management/Adjunct)  Date:  03/25/2019  Time:  09:00 am  Type of Therapy:  Nurse Education discussed coping with mental health disorders  Participation Level:  None  Participation Quality:  Inattentive  Affect:  Flat  Cognitive:  Disorganized  Insight:  Lacking  Engagement in Group:  Poor  Modes of Intervention:  Discussion and Education  Summary of Progress/Problems: pt walked in to group and immediately left.  Kadeen Sroka L 03/25/2019, 12:27 PM

## 2019-03-25 NOTE — Progress Notes (Addendum)
[facility] MD Progress Note  03/25/2019 10:06 AM John [name]  MRN:  [id number] Subjective:  "I'm the same."  Mr. Massaro found lying in bed. He reports mood as unchanged from yesterday. He is vague when asked about stressors- "family, friends, COVID" and does not elaborate. He reports intermittent CAH to stab himself and VH of "floating pink elephants and black dinosaurs." He is calm on assessment but states that he became agitated with another patient last night and wanted to "rip his head off." He denies current SI/HI and contracts for safety. He shows no signs of responding to internal stimuli. LFTs elevated on admission (AST 147, ALT 75). Patient reports he was drinking 1/5 of liquor per day prior to admission. He denies withdrawal symptoms. Mild tremor observed. He denies IV drug use. He reports he was off medications for several weeks prior to admission but previously stable with no SI/hallucinations on Seroquel 50 BID and Remeron 30 mg QHS.  From admission H&P: 49 year old single male, unemployed, currently homeless. Reports recently worsening depression, with suicidal ideations described as thoughts of jumping off a bridge or cut himself. Describes auditory hallucinations (voice telling him to kill himself) and as per chart notes also reported visual hallucination of "pink elephants" in ED.   Principal Problem: Severe recurrent major depression with psychotic features ([place]) Diagnosis: Principal Problem:   Severe recurrent major depression with psychotic features ([place]) Active Problems:   HIV (human immunodeficiency virus infection) ([place])   Cannabis use disorder, moderate, dependence ([place])  Total Time spent with patient: 15 minutes  Past Psychiatric History: See admission H&P  Past Medical History:  Past Medical History:  Diagnosis Date  . Alcohol abuse   . Alcoholism ([place])   . Anxiety   . Asthma   . Asthma   . Bipolar disorder ([place])   . Chronic low back pain   . Cocaine abuse ([place])    . Depression   . Gout   . Gout   . HIV (human immunodeficiency virus infection) ([place])    "dx'd ~ 2 yr ago" (09/29/2012)  . HIV (human immunodeficiency virus infection) ([place])   . HIV disease ([place])   . Homelessness   . Hypertension   . Marijuana abuse   . Mental disorder   . Schizophrenia [facility])     Past Surgical History:  Procedure Laterality Date  . SKIN GRAFT FULL THICKNESS LEG Left ?   POSTERIOR LEFT LEG  AFTER DOG BITES   Family History:  Family History  Problem Relation Age of Onset  . Alcoholism Mother   . Depression Mother   . Alcoholism Brother    Family Psychiatric  History: See admission H&P Social History:  Social History   Substance and Sexual Activity  Alcohol Use Yes   Comment: quit 06/2015     Social History   Substance and Sexual Activity  Drug Use Yes  . Types: Cocaine, Marijuana    Social History   Socioeconomic History  . Marital status: Single    Spouse name: Not on file  . Number of children: Not on file  . Years of education: Not on file  . Highest education level: Not on file  Occupational History  . Not on file  Tobacco Use  . Smoking status: Current Every Day Smoker    Packs/day: 0.50    Years: 27.00    Pack years: 13.50    Types: Cigarettes  . Smokeless tobacco: Never Used  . Tobacco comment: cutting back  Substance and Sexual  Activity  . Alcohol use: Yes    Comment: quit 06/2015  . Drug use: Yes    Types: Cocaine, Marijuana  . Sexual activity: Not Currently    Comment: pt. declined condoms  Other Topics Concern  . Not on file  Social History Narrative   ** Merged History Encounter **       ** Merged History Encounter **       Social Determinants of Health   Financial Resource Strain:   . Difficulty of Paying Living Expenses: Not on file  Food Insecurity:   . Worried About Charity fundraiser in the Last Year: Not on file  . Ran Out of Food in the Last Year: Not on file  Transportation Needs:   . Lack of  Transportation (Medical): Not on file  . Lack of Transportation (Non-Medical): Not on file  Physical Activity:   . Days of Exercise per Week: Not on file  . Minutes of Exercise per Session: Not on file  Stress:   . Feeling of Stress : Not on file  Social Connections:   . Frequency of Communication with Friends and Family: Not on file  . Frequency of Social Gatherings with Friends and Family: Not on file  . Attends Religious Services: Not on file  . Active Member of Clubs or Organizations: Not on file  . Attends Archivist Meetings: Not on file  . Marital Status: Not on file   Additional Social History:                         Sleep: Good  Appetite:  Good  Current Medications: Current Facility-Administered Medications  Medication Dose Route Frequency Provider Last Rate Last Admin  . acetaminophen (TYLENOL) tablet 650 mg  650 mg Oral Q6H PRN Lindon Romp A, NP      . albuterol (VENTOLIN HFA) 108 (90 Base) MCG/ACT inhaler 2 puff  2 puff Inhalation Q4H PRN Lindon Romp A, NP      . alum & mag hydroxide-simeth (MAALOX/MYLANTA) 200-200-20 MG/5ML suspension 30 mL  30 mL Oral Q4H PRN Rozetta Nunnery, NP      . darunavir-cobicistat (PREZCOBIX) 800-150 MG per tablet 1 tablet  1 tablet Oral Q breakfast Lindon Romp A, NP   1 tablet at 03/25/19 0915  . emtricitabine-tenofovir AF (DESCOVY) 200-25 MG per tablet 1 tablet  1 tablet Oral Daily Lindon Romp A, NP   1 tablet at 03/25/19 0741  . feeding supplement (ENSURE ENLIVE) (ENSURE ENLIVE) liquid 237 mL  237 mL Oral BID BM Lindon Romp A, NP   237 mL at 03/25/19 0915  . hydrOXYzine (ATARAX/VISTARIL) tablet 25 mg  25 mg Oral TID PRN Rozetta Nunnery, NP   25 mg at 03/24/19 2012  . loperamide (IMODIUM) capsule 2-4 mg  2-4 mg Oral PRN Lindon Romp A, NP      . LORazepam (ATIVAN) tablet 1 mg  1 mg Oral Q6H PRN Lindon Romp A, NP   1 mg at 03/24/19 2012  . magnesium hydroxide (MILK OF MAGNESIA) suspension 30 mL  30 mL Oral Daily PRN  Lindon Romp A, NP      . mirtazapine (REMERON) tablet 15 mg  15 mg Oral QHS Connye Burkitt, NP      . multivitamin with minerals tablet 1 tablet  1 tablet Oral Daily Lindon Romp A, NP   1 tablet at 03/25/19 0741  . ondansetron (ZOFRAN-ODT) disintegrating tablet 4 mg  4 mg Oral Q6H PRN Lindon Romp A, NP      . QUEtiapine (SEROQUEL) tablet 50 mg  50 mg Oral BID Connye Burkitt, NP      . thiamine tablet 100 mg  100 mg Oral Daily Lindon Romp A, NP   100 mg at 03/25/19 0741  . traZODone (DESYREL) tablet 50 mg  50 mg Oral QHS PRN Rozetta Nunnery, NP   50 mg at 03/24/19 2012    Lab Results:  Results for orders placed or performed during the hospital encounter of 03/23/19 (from the past 48 hour(s))  Hemoglobin A1c     Status: None   Collection Time: 03/24/19  6:41 PM  Result Value Ref Range   Hgb A1c MFr Bld 5.0 4.8 - 5.6 %    Comment: (NOTE) Pre diabetes:          5.7%-6.4% Diabetes:              >6.4% Glycemic control for   <7.0% adults with diabetes    Mean Plasma Glucose 96.8 mg/dL    Comment: Performed at [facility], [place] [address], [place], [place] [address]  Lipid panel     Status: None   Collection Time: 03/24/19  6:41 PM  Result Value Ref Range   Cholesterol 139 0 - 200 mg/dL   Triglycerides 115 <150 mg/dL   HDL 73 >40 mg/dL   Total CHOL/HDL Ratio 1.9 RATIO   VLDL 23 0 - 40 mg/dL   LDL Cholesterol 43 0 - 99 mg/dL    Comment:        Total Cholesterol/HDL:CHD Risk Coronary Heart Disease Risk Table                     Men   Women  1/2 Average Risk   3.4   3.3  Average Risk       5.0   4.4  2 X Average Risk   9.6   7.1  3 X Average Risk  23.4   11.0        Use the calculated Patient Ratio above and the CHD Risk Table to determine the patient's CHD Risk.        ATP III CLASSIFICATION (LDL):  <100     mg/dL   Optimal  100-129  mg/dL   Near or Above                    Optimal  130-159  mg/dL   Borderline  160-189  mg/dL   High  >190     mg/dL   Very  High Performed at [facility] [address], [place], [place] [address]   TSH     Status: None   Collection Time: 03/24/19  6:41 PM  Result Value Ref Range   TSH 1.263 0.350 - 4.500 uIU/mL    Comment: Performed by a 3rd Generation assay with a functional sensitivity of <=0.01 uIU/mL. Performed at [facility], [place] [address], [place],  [address]     Blood Alcohol level:  Lab Results  Component Value Date   ETH <10 03/23/2019   ETH 121 (H) [id number]    Metabolic Disorder Labs: Lab Results  Component Value Date   HGBA1C 5.0 03/24/2019   MPG 96.8 03/24/2019   MPG 114 04/23/2014   Lab Results  Component Value Date   PROLACTIN 6.7 09/29/2012   Lab Results  Component Value Date  CHOL 139 03/24/2019   TRIG 115 03/24/2019   HDL 73 03/24/2019   CHOLHDL 1.9 03/24/2019   VLDL 23 03/24/2019   LDLCALC 43 03/24/2019   LDLCALC 83 01/03/2018    Physical Findings: AIMS: Facial and Oral Movements Muscles of Facial Expression: None, normal Lips and Perioral Area: None, normal Jaw: None, normal Tongue: None, normal,Extremity Movements Upper (arms, wrists, hands, fingers): None, normal Lower (legs, knees, ankles, toes): None, normal, Trunk Movements Neck, shoulders, hips: None, normal, Overall Severity Severity of abnormal movements (highest score from questions above): None, normal Incapacitation due to abnormal movements: None, normal Patient's awareness of abnormal movements (rate only patient's report): No Awareness, Dental Status Current problems with teeth and/or dentures?: No Does patient usually wear dentures?: No  CIWA:  CIWA-Ar Total: 3 COWS:     Musculoskeletal: Strength & Muscle Tone: within normal limits Gait & Station: normal Patient leans: N/A  Psychiatric Specialty Exam: Physical Exam  Nursing note and vitals reviewed. Constitutional: He is oriented to person, place, and time. He appears well-developed and  well-nourished.  Cardiovascular: Normal rate.  Respiratory: Effort normal.  Neurological: He is alert and oriented to person, place, and time.    Review of Systems  Constitutional: Negative.   Respiratory: Negative for cough and shortness of breath.   Cardiovascular: Negative for chest pain.  Gastrointestinal: Negative for diarrhea, nausea and vomiting.  Neurological: Positive for tremors. Negative for dizziness, seizures, weakness and headaches.  Psychiatric/Behavioral: Positive for dysphoric mood and hallucinations. Negative for agitation, behavioral problems, self-injury, sleep disturbance and suicidal ideas. The patient is not nervous/anxious.     Blood pressure 112/90, pulse 93, temperature (!) 97.5 F (36.4 C), temperature source Oral, resp. rate 18, height 5\' 8"  (1.727 m), weight 68.9 kg, SpO2 99 %.Body mass index is 23.11 kg/m.  General Appearance: Disheveled  Eye Contact:  Good  Speech:  Normal Rate  Volume:  Normal  Mood:  Depressed  Affect:  Constricted  Thought Process:  Coherent  Orientation:  Full (Time, Place, and Person)  Thought Content:  Logical  Suicidal Thoughts:  No  Homicidal Thoughts:  No  Memory:  Immediate;   Fair Recent;   Fair  Judgement:  Fair  Insight:  Fair  Psychomotor Activity:  Normal  Concentration:  Concentration: Fair and Attention Span: Fair  Recall:  [employer] of Knowledge:  Fair  Language:  Fair  Akathisia:  No  Handed:  Right  AIMS (if indicated):     Assets:  Communication Skills Leisure Time Resilience  ADL's:  Intact  Cognition:  WNL  Sleep:  Number of Hours: 5     Treatment Plan Summary: Daily contact with patient to assess and evaluate symptoms and progress in treatment and Medication management   Continue inpatient hospitalization. Repeat LFTs.  Start Remeron 15 mg PO QHS for depression Increase Seroquel to 50 mg PO BID for psychosis/agitation Continue Ativan 1 mg PO Q6HR PRN CIWA>10 Continue Prezcobix 800-150 mg  PO daily for HIV Continue Descovy 200-25 mg PO daily for HIV Continue Vistaril 25 mg PO TID PRN anxiety Continue thiamine 100 mg PO daily for supplementation Continue trazodone 50 mg PO QHS PRN insomnia  Patient will participate in the therapeutic group milieu.  Discharge disposition in progress.   Connye Burkitt, NP 03/25/2019, 10:06 AM   Attest to NP Note

## 2019-03-25 NOTE — BHH Group Notes (Signed)
[place] Group Notes: (Clinical Social Work)   03/25/2019      Type of Therapy:  Group Therapy   Participation Level:  Did Not Attend - was invited both individually by MHT and by overhead announcement, chose not to attend.   Selmer Dominion, LCSW 03/25/2019, 11:58 AM

## 2019-03-25 NOTE — Plan of Care (Signed)
Progress note  D: pt found in bed; compliant with medication administration. Pt presents anxious with an agitated demeanor. Pt denies any physical symptoms or pain. Pt states their goal while here is to find housing. Pt states they [place] [address] but lost this when in prison. Pt states they told them that it would be 5 years before he could get housing again. Pt is minimal and guarded. Pt does endorse si with a plan to jump out of a window. Pt contracts for safety by stating they will come and talk to staff before harming themself and carrying out this plan while at [place] denies hi/ah/vh and verbally contracts as well.  A: Pt provided support and encouragement. Pt given medication per protocol and standing orders. Q37m safety checks implemented and continued.  R: Pt safe on the unit. Will continue to monitor.  Pt progressing in the following metrics  Problem: Education: Goal: Knowledge of  General Education information/materials will improve Outcome: Progressing Goal: Emotional status will improve Outcome: Progressing Goal: Mental status will improve Outcome: Progressing

## 2019-03-26 MED ORDER — TRAZODONE HCL 150 MG PO TABS
300.0000 mg | ORAL_TABLET | Freq: Every evening | ORAL | Status: DC | PRN
  Administered 2019-03-26: 300 mg via ORAL
  Filled 2019-03-26: qty 2
  Filled 2019-03-26: qty 14

## 2019-03-26 NOTE — Progress Notes (Signed)
Recreation Therapy Notes  INPATIENT RECREATION THERAPY ASSESSMENT  Patient Details Name: John [name] MRN: [id number] DOB: Dec 02, 1970 Today's Date: 03/26/2019       Information Obtained From: Patient  Able to Participate in Assessment/Interview: Yes  Patient Presentation: Alert  Reason for Admission (Per Patient): Suicidal Ideation  Patient Stressors: Death, Other (Comment)(Living situation)  Coping Skills:   Isolation, Self-Injury, TV, Arguments, Aggression, Music, Substance Abuse, Talk, Prayer, Avoidance, Read, Hot Bath/Shower  Leisure Interests (2+):  Exercise - Walking, Individual - Other (Comment)(Play with dog)  Frequency of Recreation/Participation: Weekly  Awareness of Community Resources:  Yes  Community Resources:  [place], [place], Other (Comment)(Museum)  Current Use: Yes  If no, Barriers?:    Expressed Interest in [place]: No  [place] of Residence:  [place]  Patient Main Form of Transportation: Diplomatic Services operational officer  Patient Strengths:  Love to make people laugh; Love to make friends  Patient Identified Areas of Improvement:  Alcohol  Patient Goal for Hospitalization:  "try to separate all that"  Current SI (including self-harm):  Yes(Rated 7 out 10; Contracts for safety)  Current HI:  No  Current AVH: Yes(Seeing purple elephants; black dinosaurs with purple dots)  Staff Intervention Plan: Group Attendance, Collaborate with Interdisciplinary Treatment Team  Consent to Intern Participation: N/A    [name], LRT/CTRS   [name] A 03/26/2019, 12:51 PM

## 2019-03-26 NOTE — Progress Notes (Signed)
Recreation Therapy Notes  Date: 1.11.21 Time: 1000 Location: 500 Hall Dayroom  Group Topic: Coping Skills  Goal Area(s) Addresses:  Patient will identify positive coping skills. Patient will identify benefit of using coping skills.  Intervention: Worksheet  Activity: Healthy vs. Unhealthy Coping Strategies.  Patients were to identify a problem they are currently facing.  Patients would then identify the unhealthy coping strategies they had used and the consequences of using the unhealthy coping strategies.  Patients then identified the healthy coping strategies, benefits of these strategies and barriers to using these strategies.  Education: Radiographer, therapeutic, Dentist.   Education Outcome: Acknowledges understanding/In group clarification offered/Needs additional education.   Clinical Observations/Feedback: Pt did not attend group.   [name], LRT/CTRS         [name] A 03/26/2019 12:27 PM

## 2019-03-26 NOTE — Progress Notes (Signed)
NUTRITION ASSESSMENT  Pt identified as at risk on the Malnutrition Screen Tool  INTERVENTION: 1. Supplements: Continue Ensure Enlive po BID, each supplement provides 350 kcal and 20 grams of protein  NUTRITION DIAGNOSIS: Unintentional weight loss related to sub-optimal intake as evidenced by pt report.   Goal: Pt to meet >/= 90% of their estimated nutrition needs.  Monitor:  PO intake  Assessment:  Pt admitted with depression. Pt has also been consuming 1/5 of liquor daily. Pt is currently homeless as well. Ensure supplements have been ordered.  Per weight records, pt has lost 11 lbs since November 2020 (6% wt loss x 1.5 months, significant for time frame).  Height: Ht Readings from Last 1 Encounters:  03/23/19 5\' 8"  (1.727 m)    Weight: Wt Readings from Last 1 Encounters:  03/23/19 68.9 kg    Weight Hx: Wt Readings from Last 10 Encounters:  03/23/19 68.9 kg  01/22/19 74.4 kg  12/29/18 72.6 kg  12/27/18 63.5 kg  09/03/18 59.9 kg  04/19/18 78.2 kg  02/24/18 68 kg  01/17/18 83.5 kg  10/17/17 79.4 kg  07/14/17 78.9 kg    BMI:  Body mass index is 23.11 kg/m. Pt meets criteria for normal based on current BMI.  Estimated Nutritional Needs: Kcal: 25-30 kcal/kg Protein: > 1 gram protein/kg Fluid: 1 ml/kcal  Diet Order:  Diet Order            Diet regular Room service appropriate? Yes; Fluid consistency: Thin  Diet effective now             Pt is also offered choice of unit snacks mid-morning and mid-afternoon.  Pt is eating as desired.   Lab results and medications reviewed.   Clayton Bibles, MS, RD, LDN Inpatient Clinical Dietitian Pager: [phone] After Hours Pager: [phone]

## 2019-03-26 NOTE — BHH Group Notes (Signed)
[facility] LCSW Group Therapy Note  Date/Time: 03/26/2019 @ 1:30pm  Type of Therapy and Topic:  Group Therapy:  Overcoming Obstacles  Participation Level:  BHH PARTICIPATION LEVEL: Active  Description of Group:    In this group patients will be encouraged to explore what they see as obstacles to their own wellness and recovery. They will be guided to discuss their thoughts, feelings, and behaviors related to these obstacles. The group will process together ways to cope with barriers, with attention given to specific choices patients can make. Each patient will be challenged to identify changes they are motivated to make in order to overcome their obstacles. This group will be process-oriented, with patients participating in exploration of their own experiences as well as giving and receiving support and challenge from other group members.  Therapeutic Goals: 1. Patient will identify personal and current obstacles as they relate to admission. 2. Patient will identify barriers that currently interfere with their wellness or overcoming obstacles.  3. Patient will identify feelings, thought process and behaviors related to these barriers. 4. Patient will identify two changes they are willing to make to overcome these obstacles:    Summary of Patient Progress   Patient was active and engaged throughout group therapy today. Patient was able to identify a personal and current obstacle that he is trying to overcome which is alcohol and him trying to stop drinking alcohol. Patient was to identify a barrier that currently interferes with the overcoming of his obstacle which is the alcohol is constantly in his face due to his living situation. Patient was able to identify a change that he is willing to make to overcome his obstacle which is to go to rehab for his alcohol use.    Therapeutic Modalities:   Cognitive Behavioral Therapy Solution Focused Therapy Motivational Interviewing Relapse Prevention  Therapy   Ardelle Anton, LCSW

## 2019-03-26 NOTE — Progress Notes (Signed)
   03/26/19 2100  Psych Admission Type (Psych Patients Only)  Admission Status Involuntary  Psychosocial Assessment  Patient Complaints Depression  Eye Contact Fair  Facial Expression Angry;Anxious  Affect Angry;Anxious;Preoccupied;Sad;Sullen  Speech Logical/coherent  Interaction Isolative  Motor Activity Slow  Appearance/Hygiene Unremarkable  Behavior Characteristics Cooperative  Mood Depressed  Thought Process  Coherency WDL  Content WDL  Delusions None reported or observed  Perception Hallucinations  Hallucination Visual  Judgment Poor  Confusion None  Danger to Self  Current suicidal ideation? Passive  Self-Injurious Behavior Some self-injurious ideation observed or expressed.  No lethal plan expressed   Agreement Not to Harm Self Yes  Description of Agreement pt verbally contracts for safety  Danger to Others  Danger to Others None reported or observed  Danger to Others Abnormal  Harmful Behavior to others No threats or harm toward other people

## 2019-03-26 NOTE — Tx Team (Signed)
Interdisciplinary Treatment and Diagnostic Plan Update  03/26/2019 Time of Session: 10:15am John [name] MRN: [id number]  Principal Diagnosis: Severe recurrent major depression with psychotic features [facility])  Secondary Diagnoses: Principal Problem:   Severe recurrent major depression with psychotic features ([place]) Active Problems:   HIV (human immunodeficiency virus infection) ([place])   Cannabis use disorder, moderate, dependence ([place])   Current Medications:  Current Facility-Administered Medications  Medication Dose Route Frequency Provider Last Rate Last Admin  . acetaminophen (TYLENOL) tablet 650 mg  650 mg Oral Q6H PRN Lindon Romp A, NP      . albuterol (VENTOLIN HFA) 108 (90 Base) MCG/ACT inhaler 2 puff  2 puff Inhalation Q4H PRN Lindon Romp A, NP      . alum & mag hydroxide-simeth (MAALOX/MYLANTA) 200-200-20 MG/5ML suspension 30 mL  30 mL Oral Q4H PRN Rozetta Nunnery, NP      . darunavir-cobicistat (PREZCOBIX) 800-150 MG per tablet 1 tablet  1 tablet Oral Q breakfast Lindon Romp A, NP   1 tablet at 03/26/19 0811  . emtricitabine-tenofovir AF (DESCOVY) 200-25 MG per tablet 1 tablet  1 tablet Oral Daily Lindon Romp A, NP   1 tablet at 03/26/19 0811  . feeding supplement (ENSURE ENLIVE) (ENSURE ENLIVE) liquid 237 mL  237 mL Oral BID BM Lindon Romp A, NP   237 mL at 03/25/19 1614  . hydrOXYzine (ATARAX/VISTARIL) tablet 25 mg  25 mg Oral TID PRN Rozetta Nunnery, NP   25 mg at 03/25/19 2105  . loperamide (IMODIUM) capsule 2-4 mg  2-4 mg Oral PRN Lindon Romp A, NP      . LORazepam (ATIVAN) tablet 1 mg  1 mg Oral Q6H PRN Lindon Romp A, NP   1 mg at 03/24/19 2012  . magnesium hydroxide (MILK OF MAGNESIA) suspension 30 mL  30 mL Oral Daily PRN Lindon Romp A, NP      . mirtazapine (REMERON) tablet 15 mg  15 mg Oral QHS Connye Burkitt, NP   15 mg at 03/25/19 2104  . multivitamin with minerals tablet 1 tablet  1 tablet Oral Daily Lindon Romp A, NP   1 tablet at 03/26/19 0811  . ondansetron  (ZOFRAN-ODT) disintegrating tablet 4 mg  4 mg Oral Q6H PRN Lindon Romp A, NP      . QUEtiapine (SEROQUEL) tablet 50 mg  50 mg Oral BID Connye Burkitt, NP   50 mg at 03/26/19 0811  . thiamine tablet 100 mg  100 mg Oral Daily Lindon Romp A, NP   100 mg at 03/26/19 [id number]  . traZODone (DESYREL) tablet 50 mg  50 mg Oral QHS PRN Rozetta Nunnery, NP   50 mg at 03/25/19 2104   PTA Medications: Medications Prior to Admission  Medication Sig Dispense Refill Last Dose  . albuterol (PROVENTIL HFA;VENTOLIN HFA) 108 (90 Base) MCG/ACT inhaler Inhale 2 puffs into the lungs every 4 (four) hours as needed for wheezing or shortness of breath. 1 Inhaler 0   . busPIRone (BUSPAR) 10 MG tablet Take 1 tablet (10 mg total) by mouth 3 (three) times daily. 90 tablet 11   . DESCOVY 200-25 MG tablet TAKE 1 TABLET BY MOUTH DAILY (Patient taking differently: Take 1 tablet by mouth daily. ) 30 tablet 5   . hydrOXYzine (ATARAX/VISTARIL) 25 MG tablet Take 1 tablet (25 mg total) by mouth daily. 30 tablet 11   . mirtazapine (REMERON) 30 MG tablet Take 1 tablet (30 mg total) by mouth at bedtime. 30 tablet  11   . PREZCOBIX 800-150 MG tablet TAKE 1 TABLET BY MOUTH DAILY WITH BREAKFAST. SWALLOW WHOLE. DO NOT CRUSH, BREAK OR CHEW TABLETS.TAKE WITH FOOD (Patient taking differently: Take 1 tablet by mouth daily with breakfast. ) 30 tablet 5   . QUEtiapine (SEROQUEL) 25 MG tablet Take 2 tablets (50 mg total) by mouth 2 (two) times daily. Take two tablets in the morning and at night . 120 tablet 11   . traZODone (DESYREL) 100 MG tablet Take 1 tablet (100 mg total) by mouth at bedtime. 30 tablet 11     Patient Stressors: Health problems Medication change or noncompliance Substance abuse  Patient Strengths: Technical sales engineer for treatment/growth  Treatment Modalities: Medication Management, Group therapy, Case management,  1 to 1 session with clinician, Psychoeducation, Recreational therapy.   Physician Treatment  Plan for Primary Diagnosis: Severe recurrent major depression with psychotic features ([place]) Long Term Goal(s): Improvement in symptoms so as ready for discharge Improvement in symptoms so as ready for discharge   Short Term Goals: Ability to identify changes in lifestyle to reduce recurrence of condition will improve Ability to verbalize feelings will improve Ability to disclose and discuss suicidal ideas Ability to demonstrate self-control will improve Ability to identify and develop effective coping behaviors will improve Compliance with prescribed medications will improve Ability to identify triggers associated with substance abuse/mental health issues will improve Compliance with prescribed medications will improve Ability to identify triggers associated with substance abuse/mental health issues will improve  Medication Management: Evaluate patient's response, side effects, and tolerance of medication regimen.  Therapeutic Interventions: 1 to 1 sessions, Unit Group sessions and Medication administration.  Evaluation of Outcomes: Progressing  Physician Treatment Plan for Secondary Diagnosis: Principal Problem:   Severe recurrent major depression with psychotic features ([place]) Active Problems:   HIV (human immunodeficiency virus infection) ([place])   Cannabis use disorder, moderate, dependence ([place])  Long Term Goal(s): Improvement in symptoms so as ready for discharge Improvement in symptoms so as ready for discharge   Short Term Goals: Ability to identify changes in lifestyle to reduce recurrence of condition will improve Ability to verbalize feelings will improve Ability to disclose and discuss suicidal ideas Ability to demonstrate self-control will improve Ability to identify and develop effective coping behaviors will improve Compliance with prescribed medications will improve Ability to identify triggers associated with substance abuse/mental health issues will  improve Compliance with prescribed medications will improve Ability to identify triggers associated with substance abuse/mental health issues will improve     Medication Management: Evaluate patient's response, side effects, and tolerance of medication regimen.  Therapeutic Interventions: 1 to 1 sessions, Unit Group sessions and Medication administration.  Evaluation of Outcomes: Progressing   RN Treatment Plan for Primary Diagnosis: Severe recurrent major depression with psychotic features ([place]) Long Term Goal(s): Knowledge of disease and therapeutic regimen to maintain health will improve  Short Term Goals: Ability to participate in decision making will improve, Ability to verbalize feelings will improve, Ability to disclose and discuss suicidal ideas, Ability to identify and develop effective coping behaviors will improve and Compliance with prescribed medications will improve  Medication Management: RN will administer medications as ordered by provider, will assess and evaluate patient's response and provide education to patient for prescribed medication. RN will report any adverse and/or side effects to prescribing provider.  Therapeutic Interventions: 1 on 1 counseling sessions, Psychoeducation, Medication administration, Evaluate responses to treatment, Monitor vital signs and CBGs as ordered, Perform/monitor CIWA, COWS, AIMS and Fall  Risk screenings as ordered, Perform wound care treatments as ordered.  Evaluation of Outcomes: Progressing   LCSW Treatment Plan for Primary Diagnosis: Severe recurrent major depression with psychotic features ([place]) Long Term Goal(s): Safe transition to appropriate next level of care at discharge, Engage patient in therapeutic group addressing interpersonal concerns.  Short Term Goals: Engage patient in aftercare planning with referrals and resources and Increase skills for wellness and recovery  Therapeutic Interventions: Assess for all discharge  needs, 1 to 1 time with Social worker, Explore available resources and support systems, Assess for adequacy in community support network, Educate family and significant other(s) on suicide prevention, Complete Psychosocial Assessment, Interpersonal group therapy.  Evaluation of Outcomes: Progressing   Progress in Treatment: Attending groups: No. Participating in groups: No. Taking medication as prescribed: Yes. Toleration medication: Yes. Family/Significant other contact made: No, will contact:  pt's case worker Patient understands diagnosis: No. Discussing patient identified problems/goals with staff: Yes. Medical problems stabilized or resolved: Yes. Denies suicidal/homicidal ideation: Yes. Issues/concerns per patient self-inventory: No. Other:   New problem(s) identified: No, Describe:  None  New Short Term/Long Term Goal(s): Medication stabilization, elimination of SI thoughts, and development of a comprehensive mental wellness plan.   Patient Goals:    Discharge Plan or Barriers: CSW will talk to patient regarding where he can go when he discharges. Patient is not allowed to go back to his housing due to losing his section 8 housing due to going to prison. CSW will give housing resources.   Reason for Continuation of Hospitalization: Anxiety Medication stabilization  Estimated Length of Stay: 1-2 days   Attendees: Patient: 03/26/2019   Physician: Dr. Johnn Hai, MD 03/26/2019   Nursing: Namon Cirri 03/26/2019   RN Care Manager: 03/26/2019   Social Worker: Ardelle Anton, LCSW  03/26/2019   Recreational Therapist:  03/26/2019  Other:  03/26/2019   Other:  03/26/2019   Other: 03/26/2019      Scribe for Treatment Team: Trecia Rogers, LCSW 03/26/2019 10:45 AM

## 2019-03-26 NOTE — Progress Notes (Signed)
Adult Psychoeducational Group Note  Date:  03/26/2019 Time:  10:54 PM  Group Topic/Focus:  Wrap-Up Group:   The focus of this group is to help patients review their daily goal of treatment and discuss progress on daily workbooks.  Participation Level:  Minimal  Participation Quality:  Drowsy and Resistant  Affect:  Depressed, Flat and Irritable  Cognitive:  Appropriate  Insight: Limited  Engagement in Group:  Poor and Resistant  Modes of Intervention:  Discussion  Additional Comments:  Pt stated his goal for today was to focus on his treatment plan. Pt stated he accomplished his goal today. Pt stated his relationship with his family has improved since he was admitted. Pt stated he did not contact anyone in his family for support system today.  Pt stated he felt better about himself today. Pt rated his overall day an 3 out of 10. Pt stated his appetite was poor today. Pt stated his sleep last night was poor but he was able to talk with the doctor today, which he is hoping to get some rest tonight. Pt stated he was in no physical pain today.  Pt deny auditory or visual hallucinations. Pt denies thoughts of harming others but did stated he had thoughts of harming himself tonight. Pt stated he could contract for safety. Pt nurse was made aware of the situation. Pt stated he would alert staff if anything changes.   Candy Sledge 03/26/2019, 10:54 PM

## 2019-03-26 NOTE — Progress Notes (Signed)
[facility] MD Progress Note  03/26/2019 11:13 AM John [name]  MRN:  [id number] Subjective:    John [name] is a 49 year old homeless individual, he stated today he was seeing "pink elephants" and when I challenged him on this and the validity of this report he began laughing acknowledging that he is malingering due to homelessness but he is generally cooperative  He denies wanting to harm self or others and denies true auditory or visual hallucinations no evidence of withdrawal symptoms Principal Problem: Severe recurrent major depression with psychotic features ([place]) Diagnosis: Principal Problem:   Severe recurrent major depression with psychotic features ([place]) Active Problems:   HIV (human immunodeficiency virus infection) ([place])   Cannabis use disorder, moderate, dependence ([place])  Total Time spent with patient: 20 minutes  Past Psychiatric History: Extensive but compromised by malingering  Past Medical History:  Past Medical History:  Diagnosis Date  . Alcohol abuse   . Alcoholism ([place])   . Anxiety   . Asthma   . Asthma   . Bipolar disorder ([place])   . Chronic low back pain   . Cocaine abuse ([place])   . Depression   . Gout   . Gout   . HIV (human immunodeficiency virus infection) ([place])    "dx'd ~ 2 yr ago" (09/29/2012)  . HIV (human immunodeficiency virus infection) ([place])   . HIV disease ([place])   . Homelessness   . Hypertension   . Marijuana abuse   . Mental disorder   . Schizophrenia [facility])     Past Surgical History:  Procedure Laterality Date  . SKIN GRAFT FULL THICKNESS LEG Left ?   POSTERIOR LEFT LEG  AFTER DOG BITES   Family History:  Family History  Problem Relation Age of Onset  . Alcoholism Mother   . Depression Mother   . Alcoholism Brother    Family Psychiatric  History: No new data Social History:  Social History   Substance and Sexual Activity  Alcohol Use Yes   Comment: quit 06/2015     Social History   Substance and Sexual Activity  Drug Use Yes  .  Types: Cocaine, Marijuana    Social History   Socioeconomic History  . Marital status: Single    Spouse name: Not on file  . Number of children: Not on file  . Years of education: Not on file  . Highest education level: Not on file  Occupational History  . Not on file  Tobacco Use  . Smoking status: Current Every Day Smoker    Packs/day: 0.50    Years: 27.00    Pack years: 13.50    Types: Cigarettes  . Smokeless tobacco: Never Used  . Tobacco comment: cutting back  Substance and Sexual Activity  . Alcohol use: Yes    Comment: quit 06/2015  . Drug use: Yes    Types: Cocaine, Marijuana  . Sexual activity: Not Currently    Comment: pt. declined condoms  Other Topics Concern  . Not on file  Social History Narrative   ** Merged History Encounter **       ** Merged History Encounter **       Social Determinants of Health   Financial Resource Strain:   . Difficulty of Paying Living Expenses: Not on file  Food Insecurity:   . Worried About Charity fundraiser in the Last Year: Not on file  . Ran Out of Food in the Last Year: Not on file  Transportation Needs:   . Lack  of Transportation (Medical): Not on file  . Lack of Transportation (Non-Medical): Not on file  Physical Activity:   . Days of Exercise per Week: Not on file  . Minutes of Exercise per Session: Not on file  Stress:   . Feeling of Stress : Not on file  Social Connections:   . Frequency of Communication with Friends and Family: Not on file  . Frequency of Social Gatherings with Friends and Family: Not on file  . Attends Religious Services: Not on file  . Active Member of Clubs or Organizations: Not on file  . Attends Archivist Meetings: Not on file  . Marital Status: Not on file   Additional Social History:                         Sleep: Fair  Appetite:  Fair  Current Medications: Current Facility-Administered Medications  Medication Dose Route Frequency Provider Last Rate Last  Admin  . acetaminophen (TYLENOL) tablet 650 mg  650 mg Oral Q6H PRN Lindon Romp A, NP      . albuterol (VENTOLIN HFA) 108 (90 Base) MCG/ACT inhaler 2 puff  2 puff Inhalation Q4H PRN Lindon Romp A, NP      . alum & mag hydroxide-simeth (MAALOX/MYLANTA) 200-200-20 MG/5ML suspension 30 mL  30 mL Oral Q4H PRN Rozetta Nunnery, NP      . darunavir-cobicistat (PREZCOBIX) 800-150 MG per tablet 1 tablet  1 tablet Oral Q breakfast Lindon Romp A, NP   1 tablet at 03/26/19 0811  . emtricitabine-tenofovir AF (DESCOVY) 200-25 MG per tablet 1 tablet  1 tablet Oral Daily Lindon Romp A, NP   1 tablet at 03/26/19 0811  . feeding supplement (ENSURE ENLIVE) (ENSURE ENLIVE) liquid 237 mL  237 mL Oral BID BM Lindon Romp A, NP   237 mL at 03/25/19 1614  . hydrOXYzine (ATARAX/VISTARIL) tablet 25 mg  25 mg Oral TID PRN Rozetta Nunnery, NP   25 mg at 03/25/19 2105  . loperamide (IMODIUM) capsule 2-4 mg  2-4 mg Oral PRN Lindon Romp A, NP      . LORazepam (ATIVAN) tablet 1 mg  1 mg Oral Q6H PRN Lindon Romp A, NP   1 mg at 03/24/19 2012  . magnesium hydroxide (MILK OF MAGNESIA) suspension 30 mL  30 mL Oral Daily PRN Lindon Romp A, NP      . mirtazapine (REMERON) tablet 15 mg  15 mg Oral QHS Connye Burkitt, NP   15 mg at 03/25/19 2104  . multivitamin with minerals tablet 1 tablet  1 tablet Oral Daily Lindon Romp A, NP   1 tablet at 03/26/19 0811  . ondansetron (ZOFRAN-ODT) disintegrating tablet 4 mg  4 mg Oral Q6H PRN Lindon Romp A, NP      . QUEtiapine (SEROQUEL) tablet 50 mg  50 mg Oral BID Connye Burkitt, NP   50 mg at 03/26/19 0811  . thiamine tablet 100 mg  100 mg Oral Daily Lindon Romp A, NP   100 mg at 03/26/19 [id number]  . traZODone (DESYREL) tablet 50 mg  50 mg Oral QHS PRN Rozetta Nunnery, NP   50 mg at 03/25/19 2104    Lab Results:  Results for orders placed or performed during the hospital encounter of 03/23/19 (from the past 48 hour(s))  Hemoglobin A1c     Status: None   Collection Time: 03/24/19  6:41 PM   Result Value Ref Range  Hgb A1c MFr Bld 5.0 4.8 - 5.6 %    Comment: (NOTE) Pre diabetes:          5.7%-6.4% Diabetes:              >6.4% Glycemic control for   <7.0% adults with diabetes    Mean Plasma Glucose 96.8 mg/dL    Comment: Performed at [facility], [name] [address], [place], [place] [address]  Lipid panel     Status: None   Collection Time: 03/24/19  6:41 PM  Result Value Ref Range   Cholesterol 139 0 - 200 mg/dL   Triglycerides 115 <150 mg/dL   HDL 73 >40 mg/dL   Total CHOL/HDL Ratio 1.9 RATIO   VLDL 23 0 - 40 mg/dL   LDL Cholesterol 43 0 - 99 mg/dL    Comment:        Total Cholesterol/HDL:CHD Risk Coronary Heart Disease Risk Table                     Men   Women  1/2 Average Risk   3.4   3.3  Average Risk       5.0   4.4  2 X Average Risk   9.6   7.1  3 X Average Risk  23.4   11.0        Use the calculated Patient Ratio above and the CHD Risk Table to determine the patient's CHD Risk.        ATP III CLASSIFICATION (LDL):  <100     mg/dL   Optimal  100-129  mg/dL   Near or Above                    Optimal  130-159  mg/dL   Borderline  160-189  mg/dL   High  >190     mg/dL   Very High Performed at [facility] [address], [place], [place] [address]   TSH     Status: None   Collection Time: 03/24/19  6:41 PM  Result Value Ref Range   TSH 1.263 0.350 - 4.500 uIU/mL    Comment: Performed by a 3rd Generation assay with a functional sensitivity of <=0.01 uIU/mL. Performed at [facility], [place] [address] [place], [place] [address]     Blood Alcohol level:  Lab Results  Component Value Date   ETH <10 03/23/2019   ETH 121 (H) [id number]    Metabolic Disorder Labs: Lab Results  Component Value Date   HGBA1C 5.0 03/24/2019   MPG 96.8 03/24/2019   MPG 114 04/23/2014   Lab Results  Component Value Date   PROLACTIN 6.7 09/29/2012   Lab Results  Component Value Date   CHOL 139 03/24/2019   TRIG 115  03/24/2019   HDL 73 03/24/2019   CHOLHDL 1.9 03/24/2019   VLDL 23 03/24/2019   LDLCALC 43 03/24/2019   LDLCALC 83 01/03/2018    Physical Findings: AIMS: Facial and Oral Movements Muscles of Facial Expression: None, normal Lips and Perioral Area: None, normal Jaw: None, normal Tongue: None, normal,Extremity Movements Upper (arms, wrists, hands, fingers): None, normal Lower (legs, knees, ankles, toes): None, normal, Trunk Movements Neck, shoulders, hips: None, normal, Overall Severity Severity of abnormal movements (highest score from questions above): None, normal Incapacitation due to abnormal movements: None, normal Patient's awareness of abnormal movements (rate only patient's report): No Awareness, Dental Status Current problems with teeth and/or dentures?: No Does  patient usually wear dentures?: No  CIWA:  CIWA-Ar Total: 0 COWS:     Musculoskeletal: Strength & Muscle Tone: within normal limits Gait & Station: normal Patient leans: N/A  Psychiatric Specialty Exam: Physical Exam  Review of Systems  Blood pressure (!) 121/92, pulse 84, temperature 98 F (36.7 C), temperature source Oral, resp. rate 18, height 5\' 8"  (1.727 m), weight 68.9 kg, SpO2 99 %.Body mass index is 23.11 kg/m.  General Appearance: Disheveled  Eye Contact:  Good  Speech:  Clear and Coherent  Volume:  Normal  Mood:  Euthymic  Affect:  Appropriate  Thought Process:  Coherent, Goal Directed and Descriptions of Associations: Intact  Orientation:  Full (Time, Place, and Person)  Thought Content:  Malingering psychosis  Suicidal Thoughts:  No  Homicidal Thoughts:  No  Memory:  Immediate;   Good Recent;   Fair Remote;   Fair  Judgement:  Fair  Insight:  Shallow  Psychomotor Activity:  Normal  Concentration:  Concentration: Fair and Attention Span: Fair  Recall:  [employer] of Knowledge:  Fair  Language:  Fair  Akathisia:  Negative  Handed:  Right  AIMS (if indicated):     Assets:   Communication Skills Physical Health Resilience  ADL's:  Intact  Cognition:  WNL  Sleep:  Number of Hours: 1.75   Treatment Plan Summary: Daily contact with patient to assess and evaluate symptoms and progress in treatment and Medication management  Plan for discharge tomorrow no change in meds or precautions with an escalation of trazodone for insomnia  Doneta Bayman, MD 03/26/2019, 11:13 AM

## 2019-03-27 MED ORDER — QUETIAPINE FUMARATE 50 MG PO TABS: 50.0000 mg | ORAL_TABLET | Freq: Three times a day (TID) | ORAL | 2 refills | Status: DC

## 2019-03-27 MED ORDER — BUSPIRONE HCL 10 MG PO TABS: 10.0000 mg | ORAL_TABLET | Freq: Three times a day (TID) | ORAL | Status: DC

## 2019-03-27 MED ORDER — TRAZODONE HCL 300 MG PO TABS: 300.0000 mg | ORAL_TABLET | Freq: Every evening | ORAL | 1 refills | Status: DC | PRN

## 2019-03-27 MED ORDER — MIRTAZAPINE 15 MG PO TABS: 15.0000 mg | ORAL_TABLET | Freq: Every day | ORAL | 1 refills | Status: DC

## 2019-03-27 NOTE — Discharge Summary (Signed)
Physician Discharge Summary Note  Patient:  John [name] is an 49 y.o., male MRN:  [id number] DOB:  [birth year] Patient phone:  [phone] (home)  Patient address:   [place] [address],  Total Time spent with patient: 45 minutes  Date of Admission:  03/23/2019 Date of Discharge: 03/27/2019  Reason for Admission:   This was the latest of numerous encounters and admissions for John [name] he is 23 he is HIV positive he was petitioned after presenting himself to [facility] services voicing suicidal plans.  He is homeless he is a polysubstance abuser and he endorsed bizarre hallucinations -later confessing that he was malingering psychosis, and he has had numerous similar presentations.  I cannot state to a reasonable degree of medical certainty that his admissions are propelled by secondary gain issues rather than genuine pathology warranting inpatient care.  O Drug screen reflecting the abuse of cocaine and cannabis  Principal Problem: Severe recurrent major depression with psychotic features [facility]) Discharge Diagnoses: Principal Problem:   Severe recurrent major depression with psychotic features ([place]) Active Problems:   HIV (human immunodeficiency virus infection) ([place])   Cannabis use disorder, moderate, dependence ([place])   Past Psychiatric History: Extensive with similar presentations previously known malingerer known substance abuser  Past Medical History:  Past Medical History:  Diagnosis Date  . Alcohol abuse   . Alcoholism ([place])   . Anxiety   . Asthma   . Asthma   . Bipolar disorder ([place])   . Chronic low back pain   . Cocaine abuse ([place])   . Depression   . Gout   . Gout   . HIV (human immunodeficiency virus infection) ([place])    "dx'd ~ 2 yr ago" (09/29/2012)  . HIV (human immunodeficiency virus infection) ([place])   . HIV disease ([place])   . Homelessness   . Hypertension   . Marijuana abuse   . Mental disorder   . Schizophrenia [facility])     Past  Surgical History:  Procedure Laterality Date  . SKIN GRAFT FULL THICKNESS LEG Left ?   POSTERIOR LEFT LEG  AFTER DOG BITES   Family History:  Family History  Problem Relation Age of Onset  . Alcoholism Mother   . Depression Mother   . Alcoholism Brother    Family Psychiatric  History: No new data shared Social History:  Social History   Substance and Sexual Activity  Alcohol Use Yes   Comment: quit 06/2015     Social History   Substance and Sexual Activity  Drug Use Yes  . Types: Cocaine, Marijuana    Social History   Socioeconomic History  . Marital status: Single    Spouse name: Not on file  . Number of children: Not on file  . Years of education: Not on file  . Highest education level: Not on file  Occupational History  . Not on file  Tobacco Use  . Smoking status: Current Every Day Smoker    Packs/day: 0.50    Years: 27.00    Pack years: 13.50    Types: Cigarettes  . Smokeless tobacco: Never Used  . Tobacco comment: cutting back  Substance and Sexual Activity  . Alcohol use: Yes    Comment: quit 06/2015  . Drug use: Yes    Types: Cocaine, Marijuana  . Sexual activity: Not Currently    Comment: pt. declined condoms  Other Topics Concern  . Not on file  Social History Narrative   ** Merged History Encounter **       **  Merged History Encounter **       Social Determinants of Health   Financial Resource Strain:   . Difficulty of Paying Living Expenses: Not on file  Food Insecurity:   . Worried About Charity fundraiser in the Last Year: Not on file  . Ran Out of Food in the Last Year: Not on file  Transportation Needs:   . Lack of Transportation (Medical): Not on file  . Lack of Transportation (Non-Medical): Not on file  Physical Activity:   . Days of Exercise per Week: Not on file  . Minutes of Exercise per Session: Not on file  Stress:   . Feeling of Stress : Not on file  Social Connections:   . Frequency of Communication with Friends and  Family: Not on file  . Frequency of Social Gatherings with Friends and Family: Not on file  . Attends Religious Services: Not on file  . Active Member of Clubs or Organizations: Not on file  . Attends Archivist Meetings: Not on file  . Marital Status: Not on file    Hospital Course:    Once here the patient generally stayed to himself stayed in bed displayed no dangerous behaviors he required no detox measures  He continued for a while to endorse the fantastical and unlikely hallucinations of "pink elephants" however by the date of the 11th he was openly laughing about this as he stated it had acknowledged he was simply malingering this symptomatology.  By the date of the 12th he clearly no longer needed inpatient monitoring he had no withdrawal symptoms no thoughts of harming self or others and social work had arranged for shelter placement.  Physical Findings: AIMS: Facial and Oral Movements Muscles of Facial Expression: None, normal Lips and Perioral Area: None, normal Jaw: None, normal Tongue: None, normal,Extremity Movements Upper (arms, wrists, hands, fingers): None, normal Lower (legs, knees, ankles, toes): None, normal, Trunk Movements Neck, shoulders, hips: None, normal, Overall Severity Severity of abnormal movements (highest score from questions above): None, normal Incapacitation due to abnormal movements: None, normal Patient's awareness of abnormal movements (rate only patient's report): No Awareness, Dental Status Current problems with teeth and/or dentures?: No Does patient usually wear dentures?: No  CIWA:  CIWA-Ar Total: 0 COWS:     Musculoskeletal: Strength & Muscle Tone: within normal limits Gait & Station: normal Patient leans: N/A  Psychiatric Specialty Exam: Physical Exam  Review of Systems  Blood pressure 120/88, pulse (!) 104, temperature 98 F (36.7 C), temperature source Oral, resp. rate 18, height 5\' 8"  (1.727 m), weight 68.9 kg, SpO2  99 %.Body mass index is 23.11 kg/m.  General Appearance: Casual and Disheveled  Eye Contact:  Good  Speech:  Clear and Coherent  Volume:  Normal  Mood:  Euthymic  Affect:  Appropriate  Thought Process:  Coherent and Goal Directed  Orientation:  Full (Time, Place, and Person)  Thought Content:  Logical  Suicidal Thoughts:  No  Homicidal Thoughts:  No  Memory:  Immediate;   Fair Recent;   Good Remote;   Good  Judgement:  Fair  Insight:  Shallow  Psychomotor Activity:  Normal  Concentration:  Concentration: Good and Attention Span: Fair  Recall:  [employer] of Knowledge:  Fair  Language:  Fair  Akathisia:  Negative  Handed:  Right  AIMS (if indicated):     Assets:  Communication Skills  ADL's:  Intact  Cognition:  WNL  Sleep:  Number of Hours: 7.75  Have you used any form of tobacco in the last 30 days? (Cigarettes, Smokeless Tobacco, Cigars, and/or Pipes): Yes  Has this patient used any form of tobacco in the last 30 days? (Cigarettes, Smokeless Tobacco, Cigars, and/or Pipes) Yes, No  Blood Alcohol level:  Lab Results  Component Value Date   ETH <10 03/23/2019   ETH 121 (H) [id number]    Metabolic Disorder Labs:  Lab Results  Component Value Date   HGBA1C 5.0 03/24/2019   MPG 96.8 03/24/2019   MPG 114 04/23/2014   Lab Results  Component Value Date   PROLACTIN 6.7 09/29/2012   Lab Results  Component Value Date   CHOL 139 03/24/2019   TRIG 115 03/24/2019   HDL 73 03/24/2019   CHOLHDL 1.9 03/24/2019   VLDL 23 03/24/2019   LDLCALC 43 03/24/2019   LDLCALC 83 01/03/2018    See Psychiatric Specialty Exam and Suicide Risk Assessment completed by Attending Physician prior to discharge.  Discharge destination:  Home  Is patient on multiple antipsychotic therapies at discharge:  No   Has Patient had three or more failed trials of antipsychotic monotherapy by history:  No  Recommended Plan for Multiple Antipsychotic Therapies: NA   Allergies as of  03/27/2019      Reactions   Shellfish Allergy Anaphylaxis, Swelling   Shellfish Allergy Anaphylaxis      Medication List    TAKE these medications     Indication  albuterol 108 (90 Base) MCG/ACT inhaler Commonly known as: VENTOLIN HFA Inhale 2 puffs into the lungs every 4 (four) hours as needed for wheezing or shortness of breath.  Indication: Asthma   busPIRone 10 MG tablet Commonly known as: BUSPAR Take 1 tablet (10 mg total) by mouth 3 (three) times daily.  Indication: Anxiety Disorder   Descovy 200-25 MG tablet Generic drug: emtricitabine-tenofovir AF TAKE 1 TABLET BY MOUTH DAILY  Indication: HIV Disease   hydrOXYzine 25 MG tablet Commonly known as: ATARAX/VISTARIL Take 1 tablet (25 mg total) by mouth daily.  Indication: Feeling Anxious   mirtazapine 15 MG tablet Commonly known as: REMERON Take 1 tablet (15 mg total) by mouth at bedtime. What changed:   medication strength  how much to take  Indication: Major Depressive Disorder   Prezcobix 800-150 MG tablet Generic drug: darunavir-cobicistat TAKE 1 TABLET BY MOUTH DAILY WITH BREAKFAST. SWALLOW WHOLE. DO NOT CRUSH, BREAK OR CHEW TABLETS.TAKE WITH FOOD What changed: See the new instructions.  Indication: HIV Disease   QUEtiapine 50 MG tablet Commonly known as: SEROQUEL Take 1 tablet (50 mg total) by mouth 3 (three) times daily. What changed:   medication strength  when to take this  additional instructions  Indication: Generalized Anxiety Disorder   trazodone 300 MG tablet Commonly known as: DESYREL Take 1 tablet (300 mg total) by mouth at bedtime as needed for sleep. What changed:   medication strength  how much to take  when to take this  reasons to take this  Indication: Trouble Sleeping      Follow-up Information    [facility]. Go on 03/28/2019.   Why: Your hospital discharge appointment is scheduled for 01/13 at 12:15pm. Your appointment will be held in person. Please bring your hospital  discharge paperwork and photo ID. Wear a mask! Contact information: [address]  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

## 2019-03-27 NOTE — Progress Notes (Signed)
  [facility] Adult Case Management Discharge Plan :  Will you be returning to the same living situation after discharge:  Yes,  IRC/home At discharge, do you have transportation home?: Yes,  Bus Do you have the ability to pay for your medications: No.;[facility]   Release of information consent forms completed and in the chart;  Patient's signature needed at discharge.  Patient to Follow up at: Follow-up Information    [facility]. Go on 03/28/2019.   Why: Your hospital discharge appointment is scheduled for 01/13 at 12:15pm. Your appointment will be held in person. Please bring your hospital discharge paperwork and photo ID. Wear a mask! Contact information: [place] [address] [place], [facility] Follow up.   Why: If you still want residential treatment, please call number above to call about admissions.  Contact information: [place] [address] [phone]        Services, [facility] Follow up.   Why: If you still want residential treatment, please call number above to call about admissions. Contact information: [address] [place] [address] [phone]           Next level of care provider has access to [place] and Suicide Prevention discussed: No.; with PT  Have you used any form of tobacco in the last 30 days? (Cigarettes, Smokeless Tobacco, Cigars, and/or Pipes): Yes  Has patient been referred to the Quitline?: Patient refused referral  Patient has been referred for addiction treatment: Yes  Trecia Rogers, LCSW 03/27/2019, 9:10 AM
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
## 2019-03-27 NOTE — BHH Suicide Risk Assessment (Signed)
[facility] Discharge Suicide Risk Assessment   Principal Problem: Severe recurrent major depression with psychotic features [facility]) Discharge Diagnoses: Principal Problem:   Severe recurrent major depression with psychotic features ([place]) Active Problems:   HIV (human immunodeficiency virus infection) ([place])   Cannabis use disorder, moderate, dependence ([place])  Musculoskeletal: Strength & Muscle Tone: within normal limits Gait & Station: normal Patient leans: N/A  Psychiatric Specialty Exam: Physical Exam  Review of Systems  Blood pressure 120/88, pulse (!) 104, temperature 98 F (36.7 C), temperature source Oral, resp. rate 18, height 5\' 8"  (1.727 m), weight 68.9 kg, SpO2 99 %.Body mass index is 23.11 kg/m.  General Appearance: Casual and Disheveled  Eye Contact:  Good  Speech:  Clear and Coherent  Volume:  Normal  Mood:  Euthymic  Affect:  Appropriate  Thought Process:  Coherent and Goal Directed  Orientation:  Full (Time, Place, and Person)  Thought Content:  Logical  Suicidal Thoughts:  No  Homicidal Thoughts:  No  Memory:  Immediate;   Fair Recent;   Good Remote;   Good  Judgement:  Fair  Insight:  Shallow  Psychomotor Activity:  Normal  Concentration:  Concentration: Good and Attention Span: Fair  Recall:  [employer] of Knowledge:  Fair  Language:  Fair  Akathisia:  Negative  Handed:  Right  AIMS (if indicated):     Assets:  Communication Skills  ADL's:  Intact  Cognition:  WNL  Sleep:  Number of Hours: 7.75    Total Time spent with patient: 45 minutes Mental Status Per Nursing Assessment::   On Admission:  Suicidal ideation indicated by patient  Demographic Factors:  Male  Loss Factors: Decrease in vocational status  Historical Factors: Substance abuse/malingering/HIV status  Risk Reduction Factors:   Resources given crisis line known to patient  Continued Clinical Symptoms:  Alcohol/Substance Abuse/Dependencies  Cognitive Features That Contribute To  Risk:  Polarized thinking    Suicide Risk:  Mild:  Suicidal ideation of limited frequency, intensity, duration, and specificity.  There are no identifiable plans, no associated intent, mild dysphoria and related symptoms, good self-control (both objective and subjective assessment), few other risk factors, and identifiable protective factors, including available and accessible social support.  Follow-up Information    [facility]. Go on 03/28/2019.   Why: Your hospital discharge appointment is scheduled for 01/13 at 12:15pm. Your appointment will be held in person. Please bring your hospital discharge paperwork and photo ID. Wear a mask! Contact information: [place] [address] [phone]           Johnn Hai, MD 03/27/2019, 8:06 AM

## 2019-03-27 NOTE — BHH Suicide Risk Assessment (Signed)
[place] INPATIENT:  Family/Significant Other Suicide Prevention Education  Suicide Prevention Education:  Patient Refusal for Family/Significant Other Suicide Prevention Education: The patient John [name] has refused to provide written consent for family/significant other to be provided Family/Significant Other Suicide Prevention Education during admission and/or prior to discharge.  Physician notified.  Trecia Rogers 03/27/2019, 8:59 AM

## 2019-04-11 ENCOUNTER — Encounter (HOSPITAL_COMMUNITY): Payer: Self-pay | Admitting: Internal Medicine

## 2019-04-11 ENCOUNTER — Other Ambulatory Visit: Payer: Self-pay

## 2019-04-11 ENCOUNTER — Inpatient Hospital Stay (HOSPITAL_COMMUNITY)
Admission: EM | Admit: 2019-04-11 | Discharge: 2019-04-15 | DRG: 202 | Disposition: A | Discharge status: Home / Self Care | Payer: Self-pay | Attending: Internal Medicine | Admitting: Internal Medicine

## 2019-04-11 ENCOUNTER — Emergency Department (HOSPITAL_COMMUNITY): Payer: Self-pay

## 2019-04-11 DIAGNOSIS — M545 Low back pain: Secondary | ICD-10-CM | POA: Diagnosis present

## 2019-04-11 DIAGNOSIS — Z9119 Patient's noncompliance with other medical treatment and regimen: Secondary | ICD-10-CM

## 2019-04-11 DIAGNOSIS — F1990 Other psychoactive substance use, unspecified, uncomplicated: Secondary | ICD-10-CM | POA: Diagnosis present

## 2019-04-11 DIAGNOSIS — J9690 Respiratory failure, unspecified, unspecified whether with hypoxia or hypercapnia: Secondary | ICD-10-CM

## 2019-04-11 DIAGNOSIS — J9600 Acute respiratory failure, unspecified whether with hypoxia or hypercapnia: Secondary | ICD-10-CM | POA: Diagnosis present

## 2019-04-11 DIAGNOSIS — D75839 Thrombocytosis, unspecified: Secondary | ICD-10-CM | POA: Diagnosis present

## 2019-04-11 DIAGNOSIS — Z21 Asymptomatic human immunodeficiency virus [HIV] infection status: Secondary | ICD-10-CM | POA: Diagnosis present

## 2019-04-11 DIAGNOSIS — Z91013 Allergy to seafood: Secondary | ICD-10-CM

## 2019-04-11 DIAGNOSIS — F1910 Other psychoactive substance abuse, uncomplicated: Secondary | ICD-10-CM | POA: Diagnosis present

## 2019-04-11 DIAGNOSIS — M109 Gout, unspecified: Secondary | ICD-10-CM | POA: Diagnosis present

## 2019-04-11 DIAGNOSIS — F319 Bipolar disorder, unspecified: Secondary | ICD-10-CM | POA: Diagnosis present

## 2019-04-11 DIAGNOSIS — R443 Hallucinations, unspecified: Secondary | ICD-10-CM | POA: Diagnosis not present

## 2019-04-11 DIAGNOSIS — J4541 Moderate persistent asthma with (acute) exacerbation: Principal | ICD-10-CM | POA: Diagnosis present

## 2019-04-11 DIAGNOSIS — F209 Schizophrenia, unspecified: Secondary | ICD-10-CM | POA: Diagnosis present

## 2019-04-11 DIAGNOSIS — Z818 Family history of other mental and behavioral disorders: Secondary | ICD-10-CM

## 2019-04-11 DIAGNOSIS — R0602 Shortness of breath: Secondary | ICD-10-CM

## 2019-04-11 DIAGNOSIS — J4551 Severe persistent asthma with (acute) exacerbation: Secondary | ICD-10-CM | POA: Diagnosis present

## 2019-04-11 DIAGNOSIS — I10 Essential (primary) hypertension: Secondary | ICD-10-CM | POA: Diagnosis present

## 2019-04-11 DIAGNOSIS — F17210 Nicotine dependence, cigarettes, uncomplicated: Secondary | ICD-10-CM | POA: Diagnosis present

## 2019-04-11 DIAGNOSIS — D473 Essential (hemorrhagic) thrombocythemia: Secondary | ICD-10-CM | POA: Diagnosis present

## 2019-04-11 DIAGNOSIS — Z79899 Other long term (current) drug therapy: Secondary | ICD-10-CM

## 2019-04-11 DIAGNOSIS — F1410 Cocaine abuse, uncomplicated: Secondary | ICD-10-CM | POA: Diagnosis present

## 2019-04-11 DIAGNOSIS — Z20822 Contact with and (suspected) exposure to covid-19: Secondary | ICD-10-CM | POA: Diagnosis present

## 2019-04-11 DIAGNOSIS — F10239 Alcohol dependence with withdrawal, unspecified: Secondary | ICD-10-CM | POA: Diagnosis present

## 2019-04-11 DIAGNOSIS — Z590 Homelessness: Secondary | ICD-10-CM

## 2019-04-11 DIAGNOSIS — F419 Anxiety disorder, unspecified: Secondary | ICD-10-CM | POA: Diagnosis present

## 2019-04-11 DIAGNOSIS — F1210 Cannabis abuse, uncomplicated: Secondary | ICD-10-CM | POA: Diagnosis present

## 2019-04-11 DIAGNOSIS — G8929 Other chronic pain: Secondary | ICD-10-CM | POA: Diagnosis present

## 2019-04-11 DIAGNOSIS — B20 Human immunodeficiency virus [HIV] disease: Secondary | ICD-10-CM | POA: Diagnosis present

## 2019-04-11 HISTORY — DX: Respiratory failure, unspecified, unspecified whether with hypoxia or hypercapnia: J96.90

## 2019-04-11 HISTORY — DX: Other psychoactive substance abuse, uncomplicated: F19.10

## 2019-04-11 LAB — POCT I-STAT 7, (LYTES, BLD GAS, ICA,H+H)
Acid-base deficit: 3 mmol/L — ABNORMAL HIGH (ref 0.0–2.0)
Bicarbonate: 20.9 mmol/L (ref 20.0–28.0)
Calcium, Ion: 1.19 mmol/L (ref 1.15–1.40)
HCT: 42 % (ref 39.0–52.0)
Hemoglobin: 14.3 g/dL (ref 13.0–17.0)
O2 Saturation: 98 %
Patient temperature: 98
Potassium: 3.3 mmol/L — ABNORMAL LOW (ref 3.5–5.1)
Sodium: 142 mmol/L (ref 135–145)
TCO2: 22 mmol/L (ref 22–32)
pCO2 arterial: 33 mmHg (ref 32.0–48.0)
pH, Arterial: 7.409 (ref 7.350–7.450)
pO2, Arterial: 96 mmHg (ref 83.0–108.0)

## 2019-04-11 LAB — CBC WITH DIFFERENTIAL/PLATELET
Abs Immature Granulocytes: 0.01 10*3/uL (ref 0.00–0.07)
Basophils Absolute: 0 10*3/uL (ref 0.0–0.1)
Basophils Relative: 1 %
Eosinophils Absolute: 0.1 10*3/uL (ref 0.0–0.5)
Eosinophils Relative: 2 %
HCT: 42.8 % (ref 39.0–52.0)
Hemoglobin: 14.3 g/dL (ref 13.0–17.0)
Immature Granulocytes: 0 %
Lymphocytes Relative: 60 %
Lymphs Abs: 3.5 10*3/uL (ref 0.7–4.0)
MCH: 33.3 pg (ref 26.0–34.0)
MCHC: 33.4 g/dL (ref 30.0–36.0)
MCV: 99.8 fL (ref 80.0–100.0)
Monocytes Absolute: 0.4 10*3/uL (ref 0.1–1.0)
Monocytes Relative: 7 %
Neutro Abs: 1.7 10*3/uL (ref 1.7–7.7)
Neutrophils Relative %: 30 %
Platelets: 449 10*3/uL — ABNORMAL HIGH (ref 150–400)
RBC: 4.29 MIL/uL (ref 4.22–5.81)
RDW: 12.6 % (ref 11.5–15.5)
WBC: 5.8 10*3/uL (ref 4.0–10.5)
nRBC: 0 % (ref 0.0–0.2)

## 2019-04-11 LAB — SARS CORONAVIRUS 2 (TAT 6-24 HRS): SARS Coronavirus 2: NEGATIVE

## 2019-04-11 LAB — BASIC METABOLIC PANEL
Anion gap: 16 — ABNORMAL HIGH (ref 5–15)
BUN: 6 mg/dL (ref 6–20)
CO2: 21 mmol/L — ABNORMAL LOW (ref 22–32)
Calcium: 8.9 mg/dL (ref 8.9–10.3)
Chloride: 105 mmol/L (ref 98–111)
Creatinine, Ser: 0.85 mg/dL (ref 0.61–1.24)
GFR calc Af Amer: >60 mL/min (ref >60)
GFR calc non Af Amer: >60 mL/min (ref >60)
Glucose, Bld: 94 mg/dL (ref 70–99)
Potassium: 4 mmol/L (ref 3.5–5.1)
Sodium: 142 mmol/L (ref 135–145)

## 2019-04-11 LAB — RAPID URINE DRUG SCREEN, HOSP PERFORMED
Amphetamines: NOT DETECTED
Barbiturates: NOT DETECTED
Benzodiazepines: NOT DETECTED
Cocaine: POSITIVE — AB
Opiates: NOT DETECTED
Tetrahydrocannabinol: POSITIVE — AB

## 2019-04-11 LAB — POC SARS CORONAVIRUS 2 AG -  ED: SARS Coronavirus 2 Ag: NEGATIVE

## 2019-04-11 MED ORDER — EMTRICITABINE-TENOFOVIR AF 200-25 MG PO TABS
1.0000 | ORAL_TABLET | Freq: Every day | ORAL | Status: DC
  Administered 2019-04-11 – 2019-04-15 (×5): 1 via ORAL
  Filled 2019-04-11 (×5): qty 1

## 2019-04-11 MED ORDER — DARUNAVIR-COBICISTAT 800-150 MG PO TABS
1.0000 | ORAL_TABLET | Freq: Every day | ORAL | Status: DC
  Administered 2019-04-11 – 2019-04-15 (×4): 1 via ORAL
  Filled 2019-04-11 (×6): qty 1

## 2019-04-11 MED ORDER — ACETAMINOPHEN 650 MG RE SUPP: 650.0000 mg | Freq: Four times a day (QID) | RECTAL | Status: DC | PRN

## 2019-04-11 MED ORDER — IPRATROPIUM BROMIDE HFA 17 MCG/ACT IN AERS
2.0000 | INHALATION_SPRAY | Freq: Once | RESPIRATORY_TRACT | Status: AC
  Administered 2019-04-11: 05:00:00 2 via RESPIRATORY_TRACT
  Filled 2019-04-11: qty 12.9

## 2019-04-11 MED ORDER — ALBUTEROL SULFATE (2.5 MG/3ML) 0.083% IN NEBU
2.5000 mg | INHALATION_SOLUTION | Freq: Four times a day (QID) | RESPIRATORY_TRACT | Status: DC
  Administered 2019-04-11 – 2019-04-12 (×4): 2.5 mg via RESPIRATORY_TRACT
  Filled 2019-04-11 (×4): qty 3

## 2019-04-11 MED ORDER — ALBUTEROL SULFATE HFA 108 (90 BASE) MCG/ACT IN AERS
6.0000 | INHALATION_SPRAY | Freq: Once | RESPIRATORY_TRACT | Status: AC
  Administered 2019-04-11: 06:00:00 6 via RESPIRATORY_TRACT

## 2019-04-11 MED ORDER — TRAZODONE HCL 100 MG PO TABS
300.0000 mg | ORAL_TABLET | Freq: Every evening | ORAL | Status: DC | PRN
  Administered 2019-04-12 – 2019-04-14 (×3): 300 mg via ORAL
  Filled 2019-04-11 (×3): qty 3

## 2019-04-11 MED ORDER — SODIUM CHLORIDE 0.9 % IV SOLN: Freq: Once | INTRAVENOUS | Status: AC

## 2019-04-11 MED ORDER — ONDANSETRON HCL 4 MG/2ML IJ SOLN: 4.0000 mg | Freq: Four times a day (QID) | INTRAMUSCULAR | Status: DC | PRN

## 2019-04-11 MED ORDER — HYDROXYZINE HCL 25 MG PO TABS
25.0000 mg | ORAL_TABLET | Freq: Every day | ORAL | Status: DC
  Administered 2019-04-11 – 2019-04-15 (×5): 25 mg via ORAL
  Filled 2019-04-11 (×5): qty 1

## 2019-04-11 MED ORDER — GUAIFENESIN ER 600 MG PO TB12: 600.0000 mg | ORAL_TABLET | Freq: Two times a day (BID) | ORAL | Status: DC | PRN

## 2019-04-11 MED ORDER — ONDANSETRON HCL 4 MG PO TABS: 4.0000 mg | ORAL_TABLET | Freq: Four times a day (QID) | ORAL | Status: DC | PRN

## 2019-04-11 MED ORDER — BUSPIRONE HCL 10 MG PO TABS
10.0000 mg | ORAL_TABLET | Freq: Three times a day (TID) | ORAL | Status: DC
  Administered 2019-04-11 – 2019-04-15 (×13): 10 mg via ORAL
  Filled 2019-04-11: qty 1
  Filled 2019-04-11: qty 2
  Filled 2019-04-11 (×11): qty 1

## 2019-04-11 MED ORDER — LORAZEPAM 2 MG/ML IJ SOLN: 2.0000 mg | INTRAMUSCULAR | Status: AC

## 2019-04-11 MED ORDER — ENSURE ENLIVE PO LIQD
237.0000 mL | Freq: Two times a day (BID) | ORAL | Status: DC
  Administered 2019-04-11 – 2019-04-15 (×8): 237 mL via ORAL

## 2019-04-11 MED ORDER — LORAZEPAM 2 MG/ML IJ SOLN
0.0000 mg | Freq: Four times a day (QID) | INTRAMUSCULAR | Status: AC
  Administered 2019-04-11: 16:00:00 2 mg via INTRAVENOUS
  Administered 2019-04-11: 13:00:00 1 mg via INTRAVENOUS
  Administered 2019-04-11 – 2019-04-12 (×5): 2 mg via INTRAVENOUS
  Filled 2019-04-11 (×6): qty 1

## 2019-04-11 MED ORDER — MAGNESIUM SULFATE 2 GM/50ML IV SOLN
2.0000 g | Freq: Once | INTRAVENOUS | Status: AC
  Administered 2019-04-11: 06:00:00 2 g via INTRAVENOUS
  Filled 2019-04-11: qty 50

## 2019-04-11 MED ORDER — LORAZEPAM 2 MG/ML IJ SOLN: 0.0000 mg | Freq: Two times a day (BID) | INTRAMUSCULAR | Status: AC

## 2019-04-11 MED ORDER — ALBUTEROL (5 MG/ML) CONTINUOUS INHALATION SOLN
10.0000 mg/h | INHALATION_SOLUTION | RESPIRATORY_TRACT | Status: DC
  Administered 2019-04-11: 07:00:00 10 mg/h via RESPIRATORY_TRACT
  Filled 2019-04-11: qty 20

## 2019-04-11 MED ORDER — LORAZEPAM 1 MG PO TABS: 1.0000 mg | ORAL_TABLET | ORAL | Status: AC | PRN

## 2019-04-11 MED ORDER — LORAZEPAM 2 MG/ML IJ SOLN
1.0000 mg | Freq: Once | INTRAMUSCULAR | Status: AC
  Administered 2019-04-11: 08:00:00 1 mg via INTRAVENOUS
  Filled 2019-04-11: qty 1

## 2019-04-11 MED ORDER — ADULT MULTIVITAMIN W/MINERALS CH
1.0000 | ORAL_TABLET | Freq: Every day | ORAL | Status: DC
  Administered 2019-04-11 – 2019-04-15 (×5): 1 via ORAL
  Filled 2019-04-11 (×4): qty 1

## 2019-04-11 MED ORDER — SODIUM CHLORIDE 0.9% FLUSH
3.0000 mL | Freq: Two times a day (BID) | INTRAVENOUS | Status: DC
  Administered 2019-04-11 – 2019-04-14 (×7): 3 mL via INTRAVENOUS

## 2019-04-11 MED ORDER — ACETAMINOPHEN 325 MG PO TABS: 650.0000 mg | ORAL_TABLET | Freq: Four times a day (QID) | ORAL | Status: DC | PRN

## 2019-04-11 MED ORDER — SODIUM CHLORIDE 0.9 % IV BOLUS
1000.0000 mL | Freq: Once | INTRAVENOUS | Status: AC
  Administered 2019-04-11: 08:00:00 1000 mL via INTRAVENOUS

## 2019-04-11 MED ORDER — ALBUTEROL SULFATE HFA 108 (90 BASE) MCG/ACT IN AERS
10.0000 | INHALATION_SPRAY | Freq: Once | RESPIRATORY_TRACT | Status: AC
  Administered 2019-04-11: 05:00:00 10 via RESPIRATORY_TRACT
  Filled 2019-04-11: qty 6.7

## 2019-04-11 MED ORDER — THIAMINE HCL 100 MG PO TABS
100.0000 mg | ORAL_TABLET | Freq: Every day | ORAL | Status: DC
  Administered 2019-04-12 – 2019-04-15 (×4): 100 mg via ORAL
  Filled 2019-04-11 (×4): qty 1

## 2019-04-11 MED ORDER — METHYLPREDNISOLONE SODIUM SUCC 125 MG IJ SOLR
125.0000 mg | Freq: Once | INTRAMUSCULAR | Status: AC
  Administered 2019-04-11: 05:00:00 125 mg via INTRAVENOUS
  Filled 2019-04-11: qty 2

## 2019-04-11 MED ORDER — THIAMINE HCL 100 MG/ML IJ SOLN
100.0000 mg | Freq: Every day | INTRAMUSCULAR | Status: DC
  Administered 2019-04-11: 10:00:00 100 mg via INTRAVENOUS
  Filled 2019-04-11: qty 2

## 2019-04-11 MED ORDER — QUETIAPINE FUMARATE 50 MG PO TABS
50.0000 mg | ORAL_TABLET | Freq: Three times a day (TID) | ORAL | Status: DC
  Administered 2019-04-11 – 2019-04-15 (×13): 50 mg via ORAL
  Filled 2019-04-11 (×16): qty 1

## 2019-04-11 MED ORDER — NICOTINE 21 MG/24HR TD PT24
21.0000 mg | MEDICATED_PATCH | Freq: Every day | TRANSDERMAL | Status: DC
  Administered 2019-04-11 – 2019-04-15 (×5): 21 mg via TRANSDERMAL
  Filled 2019-04-11 (×5): qty 1

## 2019-04-11 MED ORDER — MIRTAZAPINE 15 MG PO TABS
15.0000 mg | ORAL_TABLET | Freq: Every day | ORAL | Status: DC
  Administered 2019-04-11 – 2019-04-14 (×4): 15 mg via ORAL
  Filled 2019-04-11 (×5): qty 1

## 2019-04-11 MED ORDER — GUAIFENESIN ER 600 MG PO TB12
600.0000 mg | ORAL_TABLET | Freq: Two times a day (BID) | ORAL | Status: DC
  Administered 2019-04-11 – 2019-04-15 (×9): 600 mg via ORAL
  Filled 2019-04-11 (×10): qty 1

## 2019-04-11 MED ORDER — ENOXAPARIN SODIUM 40 MG/0.4ML ~~LOC~~ SOLN
40.0000 mg | SUBCUTANEOUS | Status: DC
  Administered 2019-04-11 – 2019-04-15 (×4): 40 mg via SUBCUTANEOUS
  Filled 2019-04-11 (×4): qty 0.4

## 2019-04-11 MED ORDER — LORAZEPAM 2 MG/ML IJ SOLN
1.0000 mg | INTRAMUSCULAR | Status: AC | PRN
  Administered 2019-04-11: 4 mg via INTRAVENOUS
  Administered 2019-04-12 (×2): 2 mg via INTRAVENOUS
  Filled 2019-04-11: qty 2
  Filled 2019-04-11 (×3): qty 1

## 2019-04-11 MED ORDER — FOLIC ACID 1 MG PO TABS
1.0000 mg | ORAL_TABLET | Freq: Every day | ORAL | Status: DC
  Administered 2019-04-11 – 2019-04-15 (×5): 1 mg via ORAL
  Filled 2019-04-11 (×5): qty 1

## 2019-04-11 MED ORDER — METHYLPREDNISOLONE SODIUM SUCC 125 MG IJ SOLR
60.0000 mg | Freq: Two times a day (BID) | INTRAMUSCULAR | Status: DC
  Administered 2019-04-11 – 2019-04-12 (×2): 60 mg via INTRAVENOUS
  Filled 2019-04-11 (×2): qty 2

## 2019-04-11 NOTE — ED Triage Notes (Signed)
Pt comes in [facility] from home c/o of [facility] for the past couple weeks. He does have a prescription for an albuterol inhaler but has not got it yet. Complaining of "bricks sitting on his chest"

## 2019-04-11 NOTE — H&P (Signed)
History and Physical    John [name] [id number] DOB: 1970/11/23 DOA: 04/11/2019  Referring MD/NP/PA: Davonna Belling, MD PCP: Marliss Coots, NP  Patient coming from: Home  Chief Complaint: Shortness of breath  I have personally briefly reviewed patient's old medical records in [place]   HPI: John [name] is a 49 y.o. male with medical history significant of hypertension, asthma, HIV, polysubstance abuse(cocaine abuse, alcohol, and marijuana), bipolar disorder, anxiety, and depression.  He presents with complaints of progressively worsening shortness of breath over the last 2-3 weeks.  He feels chest tightness and pressure.  Complains of associated symptoms of productive cough and wheezing.  He had been trying slow breathing without relief as he had ran out of his inhalers 2 months ago.  Denies having any chest pain, fever, chills, nausea, vomiting, abdominal pain, or diarrhea.  Symptoms feel like his previous exacerbations with asthma.  He does admit to drinking 3-4 40 ounce beers per day on average.  He denies any recent use of cocaine.  ED Course: Upon admission into the emergency department patient was noted to be afebrile, pulse 98-1 21, respirations elevated up to 31, blood pressures currently maintained, and patient placed on 4 L nasal cannula oxygen with O2 saturations maintained. Labs significant for platelets 449, potassium 4, CO2 21, glucose 94, anion gap 16. Point-of-care COVID-19 screening was negative.  Urine drug screen positive for cocaine and marijuana.  Chest x-ray was noted to be stable from prior exams. Patient had been given 125 mg of Solu-Medrol IV, 1 L normal saline IV fluids, 2 g of magnesium sulfate, Ativan, 16 puffs of albuterol inhaler, 2 puffs of ipratropium inhaler, and a hour-long continuous neb without relief of symptoms.   Review of Systems  Constitutional: Negative for chills and fever.  HENT: Negative for ear discharge and nosebleeds.   Eyes:  Negative for double vision and photophobia.  Respiratory: Positive for cough, sputum production, shortness of breath and wheezing.   Cardiovascular: Negative for chest pain and leg swelling.  Gastrointestinal: Negative for nausea.  Genitourinary: Negative for dysuria and hematuria.  Musculoskeletal: Negative for falls and myalgias.  Neurological: Negative for focal weakness and loss of consciousness.  Psychiatric/Behavioral: Positive for substance abuse. Negative for memory loss.    Past Medical History:  Diagnosis Date  . Alcohol abuse   . Alcoholism ([place])   . Anxiety   . Asthma   . Asthma   . Bipolar disorder ([place])   . Chronic low back pain   . Cocaine abuse ([place])   . Depression   . Gout   . Gout   . HIV (human immunodeficiency virus infection) ([place])    "dx'd ~ 2 yr ago" (09/29/2012)  . HIV (human immunodeficiency virus infection) ([place])   . HIV disease ([place])   . Homelessness   . Hypertension   . Marijuana abuse   . Mental disorder   . Schizophrenia [facility])     Past Surgical History:  Procedure Laterality Date  . SKIN GRAFT FULL THICKNESS LEG Left ?   POSTERIOR LEFT LEG  AFTER DOG BITES     reports that he has been smoking cigarettes. He has a 13.50 pack-year smoking history. He has never used smokeless tobacco. He reports current alcohol use. He reports current drug use. Drugs: , Cocaine, and Marijuana.  Allergies  Allergen Reactions  . Shellfish Allergy Anaphylaxis and Swelling  . Shellfish Allergy Anaphylaxis    Family History  Problem Relation Age of Onset  .  Alcoholism Mother   . Depression Mother   . Alcoholism Brother     Prior to Admission medications   Medication Sig Start Date End Date Taking? Authorizing Provider  albuterol (PROVENTIL HFA;VENTOLIN HFA) 108 (90 Base) MCG/ACT inhaler Inhale 2 puffs into the lungs every 4 (four) hours as needed for wheezing or shortness of breath. 04/14/17   Carlyle Basques, MD  busPIRone (BUSPAR) 10 MG tablet Take 1  tablet (10 mg total) by mouth 3 (three) times daily. 01/22/19   Carlyle Basques, MD  DESCOVY 200-25 MG tablet TAKE 1 TABLET BY MOUTH DAILY Patient taking differently: Take 1 tablet by mouth daily.  01/09/19   Carlyle Basques, MD  hydrOXYzine (ATARAX/VISTARIL) 25 MG tablet Take 1 tablet (25 mg total) by mouth daily. 01/22/19   Carlyle Basques, MD  mirtazapine (REMERON) 15 MG tablet Take 1 tablet (15 mg total) by mouth at bedtime. 03/27/19   Johnn Hai, MD  PREZCOBIX 800-150 MG tablet TAKE 1 TABLET BY MOUTH DAILY WITH BREAKFAST. SWALLOW WHOLE. DO NOT CRUSH, BREAK OR CHEW TABLETS.TAKE WITH FOOD Patient taking differently: Take 1 tablet by mouth daily with breakfast.  01/09/19   Carlyle Basques, MD  QUEtiapine (SEROQUEL) 50 MG tablet Take 1 tablet (50 mg total) by mouth 3 (three) times daily. 03/27/19   Johnn Hai, MD  traZODone (DESYREL) 300 MG tablet Take 1 tablet (300 mg total) by mouth at bedtime as needed for sleep. 03/27/19   Johnn Hai, MD  dicyclomine (BENTYL) 20 MG tablet Take 1 tablet (20 mg total) by mouth 2 (two) times daily. Patient not taking: Reported on 10/21/2017 01/08/17 09/03/18  Palumbo, April, MD  sucralfate (CARAFATE) 1 GM/10ML suspension Take 10 mLs (1 g total) by mouth 4 (four) times daily -  with meals and at bedtime. Patient not taking: Reported on 10/21/2017 01/08/17 09/03/18  Palumbo, April, MD    Physical Exam:  Constitutional: Middle-age male who appears to be in some mild respiratory distress Vitals:   04/11/19 0615 04/11/19 0630 04/11/19 0645 04/11/19 0700  BP: (!) 96/51 (!) 89/68    Pulse: (!) 113 (!) 114 (!) 107 (!) 109  Resp: (!) 23 19 18 19   Temp:      TempSrc:      SpO2: 96% 98% 94% 93%   Eyes: PERRL, lids and conjunctivae normal ENMT: Mucous membranes are moist. Posterior pharynx clear of any exudate or lesions.Normal dentition.  Neck: normal, supple, no masses, no thyromegaly Respiratory: Decreased overall aeration with positive expiratory wheeze  appreciated. Only able to talk in shortened sentences. Cardiovascular: Tachycardic, no murmurs / rubs / gallops. No extremity edema. 2+ pedal pulses. No carotid bruits.  Abdomen: no tenderness, no masses palpated. No hepatosplenomegaly. Bowel sounds positive.  Musculoskeletal: no clubbing / cyanosis. No joint deformity upper and lower extremities. Good ROM, no contractures. Normal muscle tone.  Skin: no rashes, lesions, ulcers. No induration Neurologic: CN 2-12 grossly intact. Sensation intact, DTR normal. Strength 5/5 in all 4.  Psychiatric: Normal judgment and insight. Alert and oriented x 3. Normal mood.     Labs on Admission: I have personally reviewed following labs and imaging studies  CBC: Recent Labs  Lab 04/11/19 0531 04/11/19 0543  WBC 5.8  --   NEUTROABS 1.7  --   HGB 14.3 14.3  HCT 42.8 42.0  MCV 99.8  --   PLT 449*  --    Basic Metabolic Panel: Recent Labs  Lab 04/11/19 0531 04/11/19 0543  NA 142 142  K 4.0  3.3*  CL 105  --   CO2 21*  --   GLUCOSE 94  --   BUN 6  --   CREATININE 0.85  --   CALCIUM 8.9  --    GFR: CrCl cannot be calculated (Unknown ideal weight.). Liver Function Tests: No results for input(s): AST, ALT, ALKPHOS, BILITOT, PROT, ALBUMIN in the last 168 hours. No results for input(s): LIPASE, AMYLASE in the last 168 hours. No results for input(s): AMMONIA in the last 168 hours. Coagulation Profile: No results for input(s): INR, PROTIME in the last 168 hours. Cardiac Enzymes: No results for input(s): CKTOTAL, CKMB, CKMBINDEX, TROPONINI in the last 168 hours. BNP (last 3 results) No results for input(s): PROBNP in the last 8760 hours. HbA1C: No results for input(s): HGBA1C in the last 72 hours. CBG: No results for input(s): GLUCAP in the last 168 hours. Lipid Profile: No results for input(s): CHOL, HDL, LDLCALC, TRIG, CHOLHDL, LDLDIRECT in the last 72 hours. Thyroid Function Tests: No results for input(s): TSH, T4TOTAL, FREET4, T3FREE,  THYROIDAB in the last 72 hours. Anemia Panel: No results for input(s): VITAMINB12, FOLATE, FERRITIN, TIBC, IRON, RETICCTPCT in the last 72 hours. Urine analysis:    Component Value Date/Time   COLORURINE YELLOW 01/08/2017 0014   APPEARANCEUR CLEAR 01/08/2017 0014   LABSPEC 1.010 01/08/2017 0014   PHURINE 5.0 01/08/2017 0014   GLUCOSEU NEGATIVE 01/08/2017 0014   HGBUR NEGATIVE 01/08/2017 0014   BILIRUBINUR NEGATIVE 01/08/2017 0014   BILIRUBINUR small 07/24/2015 1121   KETONESUR NEGATIVE 01/08/2017 0014   PROTEINUR NEGATIVE 01/08/2017 0014   UROBILINOGEN 0.2 07/24/2015 1121   UROBILINOGEN 0.2 06/20/2014 2316   NITRITE NEGATIVE 01/08/2017 0014   LEUKOCYTESUR NEGATIVE 01/08/2017 0014   Sepsis Labs: No results found for this or any previous visit (from the past 240 hour(s)).   Radiological Exams on Admission: DG Chest Port 1 View  Result Date: 04/11/2019 CLINICAL DATA:  Shortness of breath EXAM: PORTABLE CHEST 1 VIEW COMPARISON:  12/27/2018 FINDINGS: Chronic hyperinflation. There is no edema, consolidation, effusion, or pneumothorax. Full appearance at the inferior right hilum which does appear to be branching and when correlated with prior is attributed to vessels. Normal heart size and mediastinal contours. No acute osseous finding. IMPRESSION: 1. Stable compared to prior. 2. Hyperinflation. Electronically Signed   By: Monte Fantasia M.D.   On: 04/11/2019 05:03    EKG: Independently reviewed.  Sinus rhythm at 92 bpm.  Assessment/Plan Acute respiratory failure secondary to moderate persistent asthma with exacerbation: Patient presents with progressively worsening shortness of breath and cough the last 2-3 weeks. Patient was never reported to be hypoxic and was placed on nasal cannula oxygen for comfort. Chest x-ray showing hyperinflation without signs of acute infiltrate.  Suspect symptoms precipitated by cocaine.  Patient did report that he had ran out of his albuterol inhaler.   Suspect asthma exacerbation provoked by patient's history of polysubstance abuse. -Admit to a medical telemetry but changed to progressive due to alcohol withdrawal -Continuous pulse oximetry with nasal cannula oxygen as needed to maintain O2 saturations/for comfort -Peak flow monitoring -Albuterol nebulizer 4 times daily -Solu-Medrol 60 mg every 12 hours x3 doses  -Transition to p.o. prednisone when medically appropriate -Mucinex prn cough/to loosen up sputum  Polysubstance abuse: Patient denies recent use of cocaine.  He does admit to drinking 3-4 40oz beers daily.  He reports smoking half pack of cigarettes per day on average. -Check urine drug screen(positive for cocaine and marijuana) -CIWA protocol with scheduled  Ativan -Nicotine patch offered -Normal saline IV fluids at 100 mL/h for 1 L -Transitions of care polysubstance abuse and medication needs  Thrombocytosis: Acute.  Platelet count elevated at 449 on admission. -Continue to monitor  HIV: Patient followed in outpatient setting by Dr. Baxter Flattery of infectious disease. Home medication regimen includes Descovy and Prezcobix. -Continue current home medication -Continue outpatient follow-up with ID  Anxiety/depression/bipolar disorder: Patient on BuSpar 10 mg 3 times daily, hydroxyzine 25 mg daily, mirtazapine 15 mg nightly, Seroquel 50 mg 3 times daily, and trazodone 300 mg at night -Continue current regimen  Confirmatory COVID-19 screening also negative  DVT prophylaxis: Lovenox Code Status: Full Family Communication: No family requested to be of Disposition Plan: Possible discharge home in 1 to 2 days Consults called: Transitions of care Admission status: Observation  Norval Morton MD [facility] Pager [phone]   If 7PM-7AM, please contact night-coverage [email or web address] Password [facility]  04/11/2019, 7:36 AM

## 2019-04-11 NOTE — ED Notes (Signed)
Pt given "ER Happy Meal", cheese, and Coke Cola, per Luellen Pucker - RN.

## 2019-04-11 NOTE — Progress Notes (Signed)
Pt admitted to 3W15 from 6N. Alert and oriented only to self.  States he is at a bank, and mumbles various numbers when asked his birthday.  Restless, but not attempting to get OOB at this time.  Tele placed on patient and CCMD called and verified.  On continuous pulse ox. Bed alarm set and call bell within reach.
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
## 2019-04-12 DIAGNOSIS — Z21 Asymptomatic human immunodeficiency virus [HIV] infection status: Secondary | ICD-10-CM

## 2019-04-12 DIAGNOSIS — J4551 Severe persistent asthma with (acute) exacerbation: Secondary | ICD-10-CM

## 2019-04-12 DIAGNOSIS — J9600 Acute respiratory failure, unspecified whether with hypoxia or hypercapnia: Secondary | ICD-10-CM

## 2019-04-12 DIAGNOSIS — F1910 Other psychoactive substance abuse, uncomplicated: Secondary | ICD-10-CM

## 2019-04-12 DIAGNOSIS — D473 Essential (hemorrhagic) thrombocythemia: Secondary | ICD-10-CM

## 2019-04-12 DIAGNOSIS — J4541 Moderate persistent asthma with (acute) exacerbation: Principal | ICD-10-CM

## 2019-04-12 LAB — MAGNESIUM: Magnesium: 2.1 mg/dL (ref 1.7–2.4)

## 2019-04-12 LAB — BASIC METABOLIC PANEL
Anion gap: 11 (ref 5–15)
BUN: 11 mg/dL (ref 6–20)
CO2: 21 mmol/L — ABNORMAL LOW (ref 22–32)
Calcium: 9.3 mg/dL (ref 8.9–10.3)
Chloride: 112 mmol/L — ABNORMAL HIGH (ref 98–111)
Creatinine, Ser: 0.91 mg/dL (ref 0.61–1.24)
GFR calc Af Amer: >60 mL/min (ref >60)
GFR calc non Af Amer: >60 mL/min (ref >60)
Glucose, Bld: 178 mg/dL — ABNORMAL HIGH (ref 70–99)
Potassium: 3.9 mmol/L (ref 3.5–5.1)
Sodium: 144 mmol/L (ref 135–145)

## 2019-04-12 LAB — CBC
HCT: 38.4 % — ABNORMAL LOW (ref 39.0–52.0)
Hemoglobin: 13 g/dL (ref 13.0–17.0)
MCH: 33.4 pg (ref 26.0–34.0)
MCHC: 33.9 g/dL (ref 30.0–36.0)
MCV: 98.7 fL (ref 80.0–100.0)
Platelets: 376 10*3/uL (ref 150–400)
RBC: 3.89 MIL/uL — ABNORMAL LOW (ref 4.22–5.81)
RDW: 12.8 % (ref 11.5–15.5)
WBC: 17.3 10*3/uL — ABNORMAL HIGH (ref 4.0–10.5)
nRBC: 0 % (ref 0.0–0.2)

## 2019-04-12 LAB — PHOSPHORUS: Phosphorus: 2.3 mg/dL — ABNORMAL LOW (ref 2.5–4.6)

## 2019-04-12 MED ORDER — ALBUTEROL SULFATE (2.5 MG/3ML) 0.083% IN NEBU
2.5000 mg | INHALATION_SOLUTION | Freq: Two times a day (BID) | RESPIRATORY_TRACT | Status: DC
  Administered 2019-04-12 – 2019-04-13 (×3): 2.5 mg via RESPIRATORY_TRACT
  Filled 2019-04-12 (×3): qty 3

## 2019-04-12 MED ORDER — METHYLPREDNISOLONE SODIUM SUCC 40 MG IJ SOLR
40.0000 mg | Freq: Two times a day (BID) | INTRAMUSCULAR | Status: AC
  Administered 2019-04-12: 23:00:00 40 mg via INTRAVENOUS
  Filled 2019-04-12: qty 1

## 2019-04-12 NOTE — Progress Notes (Signed)
Initial Nutrition Assessment  DOCUMENTATION CODES:   Not applicable  INTERVENTION:  Continue Ensure Enlive po BID, each supplement provides 350 kcal and 20 grams of protein  Encourage adequate PO intake.   NUTRITION DIAGNOSIS:   Increased nutrient needs related to chronic illness(HIV) as evidenced by estimated needs.  GOAL:   Patient will meet greater than or equal to 90% of their needs  MONITOR:   PO intake, Supplement acceptance, Skin, Weight trends, Labs, I & O's  REASON FOR ASSESSMENT:   Malnutrition Screening Tool    ASSESSMENT:   49 y.o. male with medical history significant of hypertension, asthma, HIV, polysubstance abuse(cocaine abuse, alcohol, and marijuana), bipolar disorder, anxiety, and depression  presented to the hospital with complaints of progressively worsening shortness of breath over the last 2-3 weeks.  Pt asleep during time of visit and did not awaken to RD assessment. Meal completion has been 100%. Pt currently has Ensure ordered and has been consuming them. RD to continue with current orders to aid in increased caloric and protein needs. Labs and medications reviewed.   NUTRITION - FOCUSED PHYSICAL EXAM:    Most Recent Value  Orbital Region  No depletion  Upper Arm Region  No depletion  Thoracic and Lumbar Region  Unable to assess  Buccal Region  No depletion  Temple Region  No depletion  Clavicle Bone Region  No depletion  Clavicle and Acromion Bone Region  No depletion  Scapular Bone Region  Unable to assess  Dorsal Hand  No depletion  Patellar Region  No depletion  Anterior Thigh Region  No depletion  Posterior Calf Region  No depletion  Edema (RD Assessment)  None  Hair  Reviewed  Eyes  Unable to assess  Mouth  Reviewed  Skin  Reviewed  Nails  Reviewed       Diet Order:   Diet Order            Diet regular Room service appropriate? Yes; Fluid consistency: Thin  Diet effective now              EDUCATION NEEDS:   Not  appropriate for education at this time  Skin:  Skin Assessment: Reviewed RN Assessment  Last BM:  1/26  Height:   Ht Readings from Last 1 Encounters:  12/29/18 5\' 11"  (1.803 m)    Weight:   Wt Readings from Last 1 Encounters:  04/12/19 73.9 kg    Ideal Body Weight:  78 kg  BMI:  Body mass index is 24.77 kg/m.  Estimated Nutritional Needs:   Kcal:  2100-2300  Protein:  105-115 grams  Fluid:  >/= 2 L/day    Corrin Parker, MS, RD, LDN Pager # [phone] After hours/ weekend pager # [phone]

## 2019-04-12 NOTE — Plan of Care (Signed)
  Problem: Skin Integrity: Goal: Risk for impaired skin integrity will decrease Outcome: Progressing   

## 2019-04-12 NOTE — Progress Notes (Addendum)
PROGRESS NOTE  John [name] [id number] DOB: Jul 19, 1970 DOA: 04/11/2019 PCP: Marliss Coots, NP   LOS: 1 day   Brief narrative:  As per HPI,  John [name] is a 49 y.o. male with medical history significant of hypertension, asthma, HIV, polysubstance abuse(cocaine abuse, alcohol, and marijuana), bipolar disorder, anxiety, and depression  presented to the hospital with complaints of progressively worsening shortness of breath over the last 2-3 weeks.  He had chest tightness and pressure with productive cough and wheezing.  He had been trying slow breathing without relief as he had ran out of his inhalers 2 months ago.  Symptoms feel like his previous exacerbations with asthma.  He admitted to drinking 3-4 40 ounce beers per day on average.    ED Course: Upon admission into the emergency department patient was noted to be afebrile, pulse 98-1 21, respirations elevated up to 31, blood pressures currently maintained, and patient placed on 4 L nasal cannula oxygen with O2 saturations maintained. Labs significant for platelets 449, potassium 4, CO2 21, glucose 94, anion gap 16. Point-of-care COVID-19 screening was negative.  Urine drug screen positive for cocaine and marijuana.  Chest x-ray was noted to be stable from prior exams. Patient had been given 125 mg of Solu-Medrol IV, 1 L normal saline IV fluids, 2 g of magnesium sulfate, Ativan, 16 puffs of albuterol inhaler, 2 puffs of ipratropium inhaler, and a hour-long continuous neb without relief of symptoms.   Assessment/Plan:  Principal Problem:   Moderate persistent asthma with acute exacerbation Active Problems:   HIV (human immunodeficiency virus infection) (HCC)   Severe persistent asthma with (acute) exacerbation   Polysubstance abuse (HCC)   Respiratory failure (HCC)   Thrombocytosis (HCC)   Acute respiratory failure secondary to moderate persistent asthma with exacerbation:  Chest x-ray with hyperinflation without infiltrate.   Symptoms likely exacerbated with cocaine abuse and lack of albuterol.  Noncompliant to albuterol had ran out of it. Continue oxygen nebulizer Solu-Medrol.  Continue supportive care including Mucinex.  Currently on room air.  Polysubstance abuse with mild alcohol withdrawal:  Including alcohol and cocaine/marijuana.  Urine drug screen positive for cocaine and marijuana.  Continue -CIWA protocol, nicotine patch  Normal saline IV fluids at 100 mL/h for 1 L.Transitions of care  consulted for polysubstance abuse and medication needs.  Did require restraints as well.  Thrombocytosis: Acute.    Improved.  Platelet 376.  Likely reactive.  HIV:  Continue Descovy and Prezcobix.  Close up with Dr. Baxter Flattery infectious disease as outpatient.  Anxiety/depression/bipolar disorder: Continue BuSpar, hydroxyzine, mirtazapine and Seroquel.  Continue trazodone.     VTE Prophylaxis: Lovenox subcu  Code Status: Full code  Family Communication: None  Disposition Plan: Home in 1 to 2 days.  Continue current level of treatment.  Watch for withdrawal and agitation.   Consultants:  None  Procedures:  None  Antibiotics: . None  Anti-infectives (From admission, onward)   Start     Dose/Rate Route Frequency Ordered Stop   04/11/19 1000  emtricitabine-tenofovir AF (DESCOVY) 200-25 MG per tablet 1 tablet     1 tablet Oral Daily 04/11/19 0842     04/11/19 0845  darunavir-cobicistat (PREZCOBIX) 800-150 MG per tablet 1 tablet     1 tablet Oral Daily with breakfast 04/11/19 0842         Subjective: Today, patient was somnolent at bedside.  Denies overt dyspnea  Objective: Vitals:   04/12/19 0725 04/12/19 0815  BP: 129/87   Pulse: 100 74  Resp: 18 18  Temp: 97.7 F (36.5 C)   SpO2: 100% 100%    Intake/Output Summary (Last 24 hours) at 04/12/2019 1046 Last data filed at 04/12/2019 1003 Gross per 24 hour  Intake 2149.07 ml  Output 700 ml  Net 1449.07 ml   Filed Weights   04/12/19 0000    Weight: 73.9 kg   Body mass index is 24.77 kg/m.   Physical Exam: GENERAL: Patient is somnolent HENT: No scleral pallor or icterus. Pupils equally reactive to light. Oral mucosa is moist NECK: is supple,  CHEST: Expiratory wheeze, diminished breath sounds bilaterally. CVS: S1 and S2 heard, no murmur. Regular rate and rhythm.  ABDOMEN: Soft, non-tender, bowel sounds are present. EXTREMITIES: No edema. CNS: Somnolent, moving all extremities SKIN: warm and dry without rashes.  Data Review: I have personally reviewed the following laboratory data and studies,  CBC: Recent Labs  Lab 04/11/19 0531 04/11/19 0543 04/12/19 0201  WBC 5.8  --  17.3*  NEUTROABS 1.7  --   --   HGB 14.3 14.3 13.0  HCT 42.8 42.0 38.4*  MCV 99.8  --  98.7  PLT 449*  --  [id number]   Basic Metabolic Panel: Recent Labs  Lab 04/11/19 0531 04/11/19 0543 04/12/19 0201  NA 142 142 144  K 4.0 3.3* 3.9  CL 105  --  112*  CO2 21*  --  21*  GLUCOSE 94  --  178*  BUN 6  --  11  CREATININE 0.85  --  0.91  CALCIUM 8.9  --  9.3  MG  --   --  2.1  PHOS  --   --  2.3*   Liver Function Tests: No results for input(s): AST, ALT, ALKPHOS, BILITOT, PROT, ALBUMIN in the last 168 hours. No results for input(s): LIPASE, AMYLASE in the last 168 hours. No results for input(s): AMMONIA in the last 168 hours. Cardiac Enzymes: No results for input(s): CKTOTAL, CKMB, CKMBINDEX, TROPONINI in the last 168 hours. BNP (last 3 results) No results for input(s): BNP in the last 8760 hours.  ProBNP (last 3 results) No results for input(s): PROBNP in the last 8760 hours.  CBG: No results for input(s): GLUCAP in the last 168 hours. Recent Results (from the past 240 hour(s))  SARS CORONAVIRUS 2 (TAT 6-24 HRS) Nasopharyngeal Nasopharyngeal Swab     Status: None   Collection Time: 04/11/19  6:18 AM   Specimen: Nasopharyngeal Swab  Result Value Ref Range Status   SARS Coronavirus 2 NEGATIVE NEGATIVE Final    Comment:  (NOTE) SARS-CoV-2 target nucleic acids are NOT DETECTED. The SARS-CoV-2 RNA is generally detectable in upper and lower respiratory specimens during the acute phase of infection. Negative results do not preclude SARS-CoV-2 infection, do not rule out co-infections with other pathogens, and should not be used as the sole basis for treatment or other patient management decisions. Negative results must be combined with clinical observations, patient history, and epidemiological information. The expected result is Negative. Fact Sheet for Patients: SugarRoll.be Fact Sheet for Healthcare Providers: https://www.woods-mathews.com/ This test is not yet approved or cleared by the [place] FDA and  has been authorized for detection and/or diagnosis of SARS-CoV-2 by FDA under an Emergency Use Authorization (EUA). This EUA will remain  in effect (meaning this test can be used) for the duration of the COVID-19 declaration under Section 56 4(b)(1) of the Act, 21 U.S.C. section 360bbb-3(b)(1), unless the authorization is terminated or revoked sooner. Performed at [facility]  Lab, [address], [place], [place] [address]      Studies: DG Chest Port 1 View  Result Date: 04/11/2019 CLINICAL DATA:  Shortness of breath EXAM: PORTABLE CHEST 1 VIEW COMPARISON:  12/27/2018 FINDINGS: Chronic hyperinflation. There is no edema, consolidation, effusion, or pneumothorax. Full appearance at the inferior right hilum which does appear to be branching and when correlated with prior is attributed to vessels. Normal heart size and mediastinal contours. No acute osseous finding. IMPRESSION: 1. Stable compared to prior. 2. Hyperinflation. Electronically Signed   By: Monte Fantasia M.D.   On: 04/11/2019 05:03      Flora Lipps, MD  [facility] 04/12/2019

## 2019-04-12 NOTE — Progress Notes (Signed)
Dr. Louanne Belton notified of patient experiencing sinus tachycardia.

## 2019-04-13 LAB — CBC
HCT: 38.7 % — ABNORMAL LOW (ref 39.0–52.0)
Hemoglobin: 13 g/dL (ref 13.0–17.0)
MCH: 33.9 pg (ref 26.0–34.0)
MCHC: 33.6 g/dL (ref 30.0–36.0)
MCV: 100.8 fL — ABNORMAL HIGH (ref 80.0–100.0)
Platelets: 353 10*3/uL (ref 150–400)
RBC: 3.84 MIL/uL — ABNORMAL LOW (ref 4.22–5.81)
RDW: 13.2 % (ref 11.5–15.5)
WBC: 19.3 10*3/uL — ABNORMAL HIGH (ref 4.0–10.5)
nRBC: 0 % (ref 0.0–0.2)

## 2019-04-13 LAB — BASIC METABOLIC PANEL
Anion gap: 10 (ref 5–15)
BUN: 12 mg/dL (ref 6–20)
CO2: 24 mmol/L (ref 22–32)
Calcium: 9.1 mg/dL (ref 8.9–10.3)
Chloride: 104 mmol/L (ref 98–111)
Creatinine, Ser: 0.89 mg/dL (ref 0.61–1.24)
GFR calc Af Amer: >60 mL/min (ref >60)
GFR calc non Af Amer: >60 mL/min (ref >60)
Glucose, Bld: 157 mg/dL — ABNORMAL HIGH (ref 70–99)
Potassium: 4.5 mmol/L (ref 3.5–5.1)
Sodium: 138 mmol/L (ref 135–145)

## 2019-04-13 LAB — MAGNESIUM: Magnesium: 1.9 mg/dL (ref 1.7–2.4)

## 2019-04-13 MED ORDER — PREDNISONE 10 MG (21) PO TBPK: ORAL_TABLET | ORAL | 0 refills | Status: DC

## 2019-04-13 MED ORDER — FLUTICASONE PROPIONATE HFA 110 MCG/ACT IN AERO: 2.0000 | INHALATION_SPRAY | Freq: Two times a day (BID) | RESPIRATORY_TRACT | 2 refills | Status: DC

## 2019-04-13 MED ORDER — ALBUTEROL SULFATE HFA 108 (90 BASE) MCG/ACT IN AERS: 2.0000 | INHALATION_SPRAY | RESPIRATORY_TRACT | 1 refills | Status: DC | PRN

## 2019-04-13 MED ORDER — QUETIAPINE FUMARATE 50 MG PO TABS: 50.0000 mg | ORAL_TABLET | Freq: Three times a day (TID) | ORAL | 1 refills | Status: DC

## 2019-04-13 MED ORDER — PREDNISOLONE 5 MG PO TABS
30.0000 mg | ORAL_TABLET | Freq: Every day | ORAL | Status: DC
  Administered 2019-04-13 – 2019-04-15 (×3): 30 mg via ORAL
  Filled 2019-04-13 (×4): qty 6

## 2019-04-13 MED ORDER — MIRTAZAPINE 15 MG PO TABS: 15.0000 mg | ORAL_TABLET | Freq: Every day | ORAL | 1 refills | Status: DC

## 2019-04-13 MED ORDER — BUSPIRONE HCL 10 MG PO TABS: 10.0000 mg | ORAL_TABLET | Freq: Three times a day (TID) | ORAL | 1 refills | Status: DC

## 2019-04-13 MED ORDER — HYDROXYZINE HCL 25 MG PO TABS: 25.0000 mg | ORAL_TABLET | Freq: Every day | ORAL | 1 refills | Status: AC

## 2019-04-13 MED ORDER — PREZCOBIX 800-150 MG PO TABS: 1.0000 | ORAL_TABLET | Freq: Every day | ORAL | 2 refills | Status: DC

## 2019-04-13 MED ORDER — TRAZODONE HCL 300 MG PO TABS: 300.0000 mg | ORAL_TABLET | Freq: Every evening | ORAL | 1 refills | Status: DC | PRN

## 2019-04-13 MED ORDER — DESCOVY 200-25 MG PO TABS: 1.0000 | ORAL_TABLET | Freq: Every day | ORAL | 1 refills | Status: DC

## 2019-04-13 MED ORDER — SYMTUZA 800-150-200-10 MG PO TABS: 1.0000 | ORAL_TABLET | Freq: Every day | ORAL | 0 refills | Status: DC

## 2019-04-13 MED FILL — traZODone HCL 150 MG TABS: 150 | 30 days supply | Qty: 60 | Fill #0

## 2019-04-13 MED FILL — SYMTUZA 800-150-200-10 MG T: 800-150-200 | 30 days supply | Qty: 30 | Fill #0

## 2019-04-13 MED FILL — busPIRone HCL 10 MG TABS: 10 | 30 days supply | Qty: 90 | Fill #0

## 2019-04-13 MED FILL — FLOVENT HFA 110 MCG INHALER: 110 | 30 days supply | Qty: 12 | Fill #0

## 2019-04-13 MED FILL — QUETIAPINE FUMARATE 50 MG T: 50 | 30 days supply | Qty: 90 | Fill #0

## 2019-04-13 MED FILL — predniSONE 10 MG TABS: 10 | 6 days supply | Qty: 12 | Fill #0

## 2019-04-13 MED FILL — hydrOXYzine HCL 25 MG TABS: 25 | 30 days supply | Qty: 30 | Fill #0

## 2019-04-13 MED FILL — ALBUTEROL SULFATE HFA 108 (: 108 (90 BAS | 18 days supply | Qty: 18 | Fill #0

## 2019-04-13 MED FILL — MIRTAZAPINE 15 MG TABLET: 15 | 30 days supply | Qty: 30 | Fill #0

## 2019-04-13 NOTE — Progress Notes (Signed)
PROGRESS NOTE  John [name] [id number] DOB: 19-Feb-1971 DOA: 04/11/2019 PCP: Marliss Coots, NP   LOS: 2 days   Brief narrative:  As per HPI,  John [name] is a 49 y.o. male with medical history significant of hypertension, asthma, HIV, polysubstance abuse(cocaine abuse, alcohol, and marijuana), bipolar disorder, anxiety, and depression  presented to the hospital with complaints of progressively worsening shortness of breath over the last 2-3 weeks.  He had chest tightness and pressure with productive cough and wheezing.  He had been trying slow breathing without relief as he had ran out of his inhalers 2 months ago.  Symptoms feel like his previous exacerbations with asthma.  He admitted to drinking 3-4 40 ounce beers per day on average.    ED Course: Upon admission into the emergency department patient was noted to be afebrile, pulse 98-1 21, respirations elevated up to 31, blood pressures currently maintained, and patient placed on 4 L nasal cannula oxygen with O2 saturations maintained. Labs significant for platelets 449, potassium 4, CO2 21, glucose 94, anion gap 16. Point-of-care COVID-19 screening was negative.  Urine drug screen positive for cocaine and marijuana.  Chest x-ray was noted to be stable from prior exams. Patient had been given 125 mg of Solu-Medrol IV, 1 L normal saline IV fluids, 2 g of magnesium sulfate, Ativan, 16 puffs of albuterol inhaler, 2 puffs of ipratropium inhaler, and a hour-long continuous neb without relief of symptoms.   Assessment/Plan:  Principal Problem:   Moderate persistent asthma with acute exacerbation Active Problems:   HIV (human immunodeficiency virus infection) (HCC)   Severe persistent asthma with (acute) exacerbation   Polysubstance abuse (HCC)   Respiratory failure (HCC)   Thrombocytosis (HCC)   Acute respiratory failure secondary to moderate persistent asthma with exacerbation:  Chest x-ray with hyperinflation without  infiltrate.  Symptoms likely exacerbated with cocaine abuse and lack of albuterol here at home.  Noncompliant to albuterol had ran out of it. Continue oxygen, nebulizer.  Add p.o. prednisone for few days.  Continue supportive care including Mucinex.  Currently on room air.  Breathing status has improved since yesterday.  Polysubstance abuse with mild alcohol withdrawal:  Including alcohol and cocaine/marijuana. Patient did have some diaphoresis and hallucinations yesterday.  Continue CIWA protocol.  Patient wishes to be detoxed while in the hospital.   Urine drug screen positive for cocaine and marijuana.  Continue -CIWA protocol, nicotine patch. Transitions of care  consulted for polysubstance abuse and medication needs.  Did require soft restraints and one-to-one observation.  Thrombocytosis: Acute.    Improved.  Platelet 353.  Likely reactive.  HIV:  Continue Descovy and Prezcobix.  Follows up with Dr. Baxter Flattery infectious disease as outpatient.  Anxiety/depression/bipolar disorder: Continue BuSpar, hydroxyzine, mirtazapine and Seroquel.  Continue trazodone.     VTE Prophylaxis: Lovenox subcu  Code Status: Full code  Family Communication: None  Disposition Plan: Home in 1 to 2 days.  Continue current level of treatment.  Watch for withdrawal and agitation.  Continue CIWA protocol   Consultants:  None  Procedures:  None  Antibiotics:  None  Anti-infectives (From admission, onward)   Start     Dose/Rate Route Frequency Ordered Stop   04/11/19 1000  emtricitabine-tenofovir AF (DESCOVY) 200-25 MG per tablet 1 tablet     1 tablet Oral Daily 04/11/19 0842     04/11/19 0845  darunavir-cobicistat (PREZCOBIX) 800-150 MG per tablet 1 tablet     1 tablet Oral Daily with breakfast 04/11/19 0842  Subjective: Today, patient was somnolent at bedside but was awake during conversation..  Denies overt dyspnea, cough or chest pain.  Had hallucinations and diaphoresis  yesterday.  Objective: Vitals:   04/13/19 0842 04/13/19 1000  BP: 128/87 124/82  Pulse: 62 97  Resp: 20   Temp: 97.7 F (36.5 C) 97.8 F (36.6 C)  SpO2: 99% 100%    Intake/Output Summary (Last 24 hours) at 04/13/2019 1127 Last data filed at 04/13/2019 1000 Gross per 24 hour  Intake 2140 ml  Output 1400 ml  Net 740 ml   Filed Weights   04/12/19 0000  Weight: 73.9 kg   Body mass index is 24.77 kg/m.   Physical Exam: GENERAL: Patient is alert awake and communicative.  Oriented to place and person but occasionally confused HENT: No scleral pallor or icterus. Pupils equally reactive to light. Oral mucosa is moist NECK: is supple,  CHEST:, diminished breath sounds bilaterally.  No overt wheezes noted today CVS: S1 and S2 heard, no murmur. Regular rate and rhythm.  ABDOMEN: Soft, non-tender, bowel sounds are present. EXTREMITIES: No edema. CNS: Mildly somnolent but alert awake and communicative.  Moving all extremities SKIN: warm and dry without rashes.  Data Review: I have personally reviewed the following laboratory data and studies,  CBC: Recent Labs  Lab 04/11/19 0531 04/11/19 0543 04/12/19 0201 04/13/19 0309  WBC 5.8  --  17.3* 19.3*  NEUTROABS 1.7  --   --   --   HGB 14.3 14.3 13.0 13.0  HCT 42.8 42.0 38.4* 38.7*  MCV 99.8  --  98.7 100.8*  PLT 449*  --  376 0000000   Basic Metabolic Panel: Recent Labs  Lab 04/11/19 0531 04/11/19 0543 04/12/19 0201 04/13/19 0309  NA 142 142 144 138  K 4.0 3.3* 3.9 4.5  CL 105  --  112* 104  CO2 21*  --  21* 24  GLUCOSE 94  --  178* 157*  BUN 6  --  11 12  CREATININE 0.85  --  0.91 0.89  CALCIUM 8.9  --  9.3 9.1  MG  --   --  2.1 1.9  PHOS  --   --  2.3*  --    Liver Function Tests: No results for input(s): AST, ALT, ALKPHOS, BILITOT, PROT, ALBUMIN in the last 168 hours. No results for input(s): LIPASE, AMYLASE in the last 168 hours. No results for input(s): AMMONIA in the last 168 hours. Cardiac Enzymes: No  results for input(s): CKTOTAL, CKMB, CKMBINDEX, TROPONINI in the last 168 hours. BNP (last 3 results) No results for input(s): BNP in the last 8760 hours.  ProBNP (last 3 results) No results for input(s): PROBNP in the last 8760 hours.  CBG: No results for input(s): GLUCAP in the last 168 hours. Recent Results (from the past 240 hour(s))  SARS CORONAVIRUS 2 (TAT 6-24 HRS) Nasopharyngeal Nasopharyngeal Swab     Status: None   Collection Time: 04/11/19  6:18 AM   Specimen: Nasopharyngeal Swab  Result Value Ref Range Status   SARS Coronavirus 2 NEGATIVE NEGATIVE Final    Comment: (NOTE) SARS-CoV-2 target nucleic acids are NOT DETECTED. The SARS-CoV-2 RNA is generally detectable in upper and lower respiratory specimens during the acute phase of infection. Negative results do not preclude SARS-CoV-2 infection, do not rule out co-infections with other pathogens, and should not be used as the sole basis for treatment or other patient management decisions. Negative results must be combined with clinical observations, patient history, and epidemiological  information. The expected result is Negative. Fact Sheet for Patients: SugarRoll.be Fact Sheet for Healthcare Providers: https://www.woods-mathews.com/ This test is not yet approved or cleared by the [place] FDA and  has been authorized for detection and/or diagnosis of SARS-CoV-2 by FDA under an Emergency Use Authorization (EUA). This EUA will remain  in effect (meaning this test can be used) for the duration of the COVID-19 declaration under Section 56 4(b)(1) of the Act, 21 U.S.C. section 360bbb-3(b)(1), unless the authorization is terminated or revoked sooner. Performed at [facility], [place] [address] [place], [place] [address]      Studies: No results found.   Flora Lipps, MD  [facility] 04/13/2019
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
## 2019-04-14 LAB — CBC
HCT: 42.7 % (ref 39.0–52.0)
Hemoglobin: 13.7 g/dL (ref 13.0–17.0)
MCH: 33.7 pg (ref 26.0–34.0)
MCHC: 32.1 g/dL (ref 30.0–36.0)
MCV: 105.2 fL — ABNORMAL HIGH (ref 80.0–100.0)
Platelets: 310 10*3/uL (ref 150–400)
RBC: 4.06 MIL/uL — ABNORMAL LOW (ref 4.22–5.81)
RDW: 13.1 % (ref 11.5–15.5)
WBC: 12.2 10*3/uL — ABNORMAL HIGH (ref 4.0–10.5)
nRBC: 0 % (ref 0.0–0.2)

## 2019-04-14 LAB — BASIC METABOLIC PANEL
Anion gap: 7 (ref 5–15)
BUN: 20 mg/dL (ref 6–20)
CO2: 24 mmol/L (ref 22–32)
Calcium: 8.6 mg/dL — ABNORMAL LOW (ref 8.9–10.3)
Chloride: 106 mmol/L (ref 98–111)
Creatinine, Ser: 0.96 mg/dL (ref 0.61–1.24)
GFR calc Af Amer: >60 mL/min (ref >60)
GFR calc non Af Amer: >60 mL/min (ref >60)
Glucose, Bld: 118 mg/dL — ABNORMAL HIGH (ref 70–99)
Potassium: 4.6 mmol/L (ref 3.5–5.1)
Sodium: 137 mmol/L (ref 135–145)

## 2019-04-14 LAB — MAGNESIUM: Magnesium: 1.9 mg/dL (ref 1.7–2.4)

## 2019-04-14 MED ORDER — ALBUTEROL SULFATE (2.5 MG/3ML) 0.083% IN NEBU: 2.5000 mg | INHALATION_SOLUTION | RESPIRATORY_TRACT | Status: DC | PRN

## 2019-04-14 NOTE — Progress Notes (Signed)
PROGRESS NOTE  John [name] [id number] DOB: March 17, 1970 DOA: 04/11/2019 PCP: Marliss Coots, NP   LOS: 3 days   Brief narrative:  As per HPI,  John [name] is a 49 y.o. male with medical history significant of hypertension, asthma, HIV, polysubstance abuse(cocaine abuse, alcohol, and marijuana), bipolar disorder, anxiety, and depression  presented to the hospital with complaints of progressively worsening shortness of breath over the last 2-3 weeks.  He had chest tightness and pressure with productive cough and wheezing.  He had been trying slow breathing without relief as he had ran out of his inhalers 2 months ago.  Symptoms feel like his previous exacerbations with asthma.  He admitted to drinking 3-4 -40 ounce beers per day on average.    ED Course: Upon admission into the emergency department patient was noted to be afebrile, pulse 98-1 21, respirations elevated up to 31, blood pressures currently maintained, and patient placed on 4 L nasal cannula oxygen with O2 saturations maintained. Labs significant for platelets 449, potassium 4, CO2 21, glucose 94, anion gap 16. Point-of-care COVID-19 screening was negative.  Urine drug screen positive for cocaine and marijuana.  Chest x-ray was noted to be stable from prior exams. Patient had been given 125 mg of Solu-Medrol IV, 1 L normal saline IV fluids, 2 g of magnesium sulfate, Ativan, 16 puffs of albuterol inhaler, 2 puffs of ipratropium inhaler, and a hour-long continuous neb without relief of symptoms.   Assessment/Plan:  Principal Problem:   Moderate persistent asthma with acute exacerbation Active Problems:   HIV (human immunodeficiency virus infection) (HCC)   Severe persistent asthma with (acute) exacerbation   Polysubstance abuse (HCC)   Respiratory failure (HCC)   Thrombocytosis (HCC)  Acute respiratory failure secondary to moderate persistent asthma with exacerbation:  Chest x-ray with hyperinflation without infiltrate.   Symptoms likely exacerbated with cocaine abuse and lack of albuterol here at home.  Noncompliant to albuterol had ran out of it. Continue oxygen, nebulizer.  Added p.o. prednisone. Continue supportive care including Mucinex.  Currently on room air.  Breathing status has improved at this time.  Patient was emphasized the need for compliance with inhalers and medication.  Medication assistance will be given on discharge.  Polysubstance abuse with mild alcohol withdrawal:  Including alcohol and cocaine/marijuana. Patient did have some diaphoresis and hallucinations day before yesterday but did not have hallucination yesterday.  Had mild diaphoresis and felt tremulous..  Continue CIWA protocol.  Patient wishes to be detoxed while in the hospital.   Urine drug screen positive for cocaine and marijuana.  Continue -CIWA protocol, nicotine patch. Transitions of care  consulted for polysubstance abuse and medication needs.  Patient will be given medication assistance on discharge.  Did require soft restraints and one-to-one observation.  Thrombocytosis: Acute.    Improved.  Platelet 353.  Likely reactive.  HIV:  Continue Descovy and Prezcobix.  Follows up with Dr. Baxter Flattery infectious disease as outpatient.  Anxiety/depression/bipolar disorder: Continue BuSpar, hydroxyzine, mirtazapine and Seroquel.  Continue trazodone.     VTE Prophylaxis: Lovenox subcu  Code Status: Full code  Family Communication: None  Disposition Plan: Home likely by tomorrow if patient remains stable and no signs of withdrawal.    Consultants:  None  Procedures:  None  Antibiotics: . None  Anti-infectives (From admission, onward)   Start     Dose/Rate Route Frequency Ordered Stop   04/13/19 0000  emtricitabine-tenofovir AF (DESCOVY) 200-25 MG tablet  Status:  Discontinued     1  tablet Oral Daily 04/13/19 1505 04/13/19    04/13/19 0000  darunavir-cobicistat (PREZCOBIX) 800-150 MG tablet  Status:  Discontinued     1  tablet Oral Daily with breakfast 04/13/19 1505 04/13/19    04/13/19 0000  Darunavir-Cobicisctat-Emtricitabine-Tenofovir Alafenamide (SYMTUZA) 800-150-200-10 MG TABS     1 tablet Oral Daily with breakfast 04/13/19 1610     04/11/19 1000  emtricitabine-tenofovir AF (DESCOVY) 200-25 MG per tablet 1 tablet     1 tablet Oral Daily 04/11/19 0842     04/11/19 0845  darunavir-cobicistat (PREZCOBIX) 800-150 MG per tablet 1 tablet     1 tablet Oral Daily with breakfast 04/11/19 0842       Subjective: Today, patient is more alert awake and communicative.  Denies any shortness of breath, nausea, vomiting.  No fever or chills.  Had some diaphoresis but no hallucination.   Objective: Vitals:   04/13/19 1926 04/14/19 0814  BP: 125/78 108/70  Pulse: (!) 101 70  Resp: 19 18  Temp: 97.8 F (36.6 C) 99.2 F (37.3 C)  SpO2: 99% 97%    Intake/Output Summary (Last 24 hours) at 04/14/2019 1149 Last data filed at 04/14/2019 0233 Gross per 24 hour  Intake 740 ml  Output 1950 ml  Net -1210 ml   Filed Weights   04/12/19 0000  Weight: 73.9 kg   Body mass index is 24.77 kg/m.   Physical Exam: GENERAL: Patient is alert awake and communicative.  Oriented to place and person HENT: No scleral pallor or icterus. Pupils equally reactive to light. Oral mucosa is moist NECK: is supple,  CHEST:, diminished breath sounds bilaterally.  No overt wheezes noted today CVS: S1 and S2 heard, no murmur. Regular rate and rhythm.  ABDOMEN: Soft, non-tender, bowel sounds are present. EXTREMITIES: No edema. CNS: alert awake and communicative.  Moving all extremities SKIN: warm and dry without rashes.  Data Review: I have personally reviewed the following laboratory data and studies,  CBC: Recent Labs  Lab 04/11/19 0531 04/11/19 0543 04/12/19 0201 04/13/19 0309 04/14/19 0230  WBC 5.8  --  17.3* 19.3* 12.2*  NEUTROABS 1.7  --   --   --   --   HGB 14.3 14.3 13.0 13.0 13.7  HCT 42.8 42.0 38.4* 38.7* 42.7  MCV  99.8  --  98.7 100.8* 105.2*  PLT 449*  --  376 353 [id number]   Basic Metabolic Panel: Recent Labs  Lab 04/11/19 0531 04/11/19 0543 04/12/19 0201 04/13/19 0309 04/14/19 0230  NA 142 142 144 138 137  K 4.0 3.3* 3.9 4.5 4.6  CL 105  --  112* 104 106  CO2 21*  --  21* 24 24  GLUCOSE 94  --  178* 157* 118*  BUN 6  --  11 12 20   CREATININE 0.85  --  0.91 0.89 0.96  CALCIUM 8.9  --  9.3 9.1 8.6*  MG  --   --  2.1 1.9 1.9  PHOS  --   --  2.3*  --   --    Liver Function Tests: No results for input(s): AST, ALT, ALKPHOS, BILITOT, PROT, ALBUMIN in the last 168 hours. No results for input(s): LIPASE, AMYLASE in the last 168 hours. No results for input(s): AMMONIA in the last 168 hours. Cardiac Enzymes: No results for input(s): CKTOTAL, CKMB, CKMBINDEX, TROPONINI in the last 168 hours. BNP (last 3 results) No results for input(s): BNP in the last 8760 hours.  ProBNP (last 3 results) No results for input(s): PROBNP  in the last 8760 hours.  CBG: No results for input(s): GLUCAP in the last 168 hours. Recent Results (from the past 240 hour(s))  SARS CORONAVIRUS 2 (TAT 6-24 HRS) Nasopharyngeal Nasopharyngeal Swab     Status: None   Collection Time: 04/11/19  6:18 AM   Specimen: Nasopharyngeal Swab  Result Value Ref Range Status   SARS Coronavirus 2 NEGATIVE NEGATIVE Final    Comment: (NOTE) SARS-CoV-2 target nucleic acids are NOT DETECTED. The SARS-CoV-2 RNA is generally detectable in upper and lower respiratory specimens during the acute phase of infection. Negative results do not preclude SARS-CoV-2 infection, do not rule out co-infections with other pathogens, and should not be used as the sole basis for treatment or other patient management decisions. Negative results must be combined with clinical observations, patient history, and epidemiological information. The expected result is Negative. Fact Sheet for Patients: SugarRoll.be Fact Sheet for  Healthcare Providers: https://www.woods-mathews.com/ This test is not yet approved or cleared by the [place] FDA and  has been authorized for detection and/or diagnosis of SARS-CoV-2 by FDA under an Emergency Use Authorization (EUA). This EUA will remain  in effect (meaning this test can be used) for the duration of the COVID-19 declaration under Section 56 4(b)(1) of the Act, 21 U.S.C. section 360bbb-3(b)(1), unless the authorization is terminated or revoked sooner. Performed at [facility], [place] [address], [place], [place] [address]      Studies: No results found.   Flora Lipps, MD  [facility] 04/14/2019

## 2019-04-15 NOTE — Discharge Summary (Signed)
Physician Discharge Summary  John [name] [id number] DOB: Dec 05, 1970 DOA: 04/11/2019  PCP: Marliss Coots, NP  Admit date: 04/11/2019 Discharge date: 04/15/2019  Admitted From: Home  Discharge disposition: Home   Recommendations for Outpatient Follow-Up:   . Follow up with your primary care provider in one week.  . Follow-up with infectious disease as scheduled by you.  Discharge Diagnosis:   Principal Problem:   Moderate persistent asthma with acute exacerbation Active Problems:   HIV (human immunodeficiency virus infection) ([place])   Severe persistent asthma with (acute) exacerbation   Polysubstance abuse ([place])   Respiratory failure (HCC)   Thrombocytosis ([place])  Discharge Condition: Improved.  Diet recommendation:   Regular.  Wound care: None.  Code status: Full.   History of Present Illness:   John [name] a 49 y.o.malewith medical history significant ofhypertension, asthma, HIV, polysubstance abuse(cocaine abuse, alcohol,andmarijuana), bipolar disorder, anxiety, and depression  presented to the hospital with complaints of progressively worsening shortness of breath over the last 2-3weeks. He had chest tightness and pressure with productive cough and wheezing. He had been trying slow breathing without relief as he had ran out of his inhalers 2 months ago. Symptoms feel like his previous exacerbations with asthma. He admitted to drinking 05-17-38 ounce beers per day on average.   ED Course:Upon admission into the emergency department patient was noted to beafebrile, pulse 98-1 21, respirations elevated up to 31, blood pressures currently maintained, and patient placed on 4 L nasal cannula oxygen with O2 saturations maintained. Labs significant for platelets 449, potassium 4, CO2 21, glucose 94, anion gap 16. Point-of-care COVID-19 screening was negative.  Urine drug screen positive for cocaine and marijuana. Chest x-ray was noted to be stable from  prior exams. Patient had been given 125 mg of Solu-Medrol IV, 1 L normal saline IV fluids, 2 g of magnesium sulfate, Ativan, 16 puffs of albuterol inhaler, 2 puffs of ipratropium inhaler,and a hour-long continuous neb without relief of symptoms.   Hospital Course:   Following conditions were addressed during hospitalization as listed below,  Acute respiratory failure secondary to moderate persistent asthmawithexacerbation:  improved. On room air. Chest x-ray with hyperinflation without infiltrate.  Symptoms likely exacerbated with cocaine abuse and lack of albuterol inhaler at home.  Noncompliant to albuterol had ran out of it.  Patient was emphasized the need for compliance with inhalers and medication.  Medication assistance will be given on discharge.  Polysubstance abuse with mild alcohol withdrawal: Including alcohol and cocaine/marijuana.   Did have mild alcohol withdrawal in the hospital so was put on CIWA protocol.  Has not required any Ativan today and appears to be very stable.  At this time patient is stable for disposition home. Did require soft restraints and one-to-one observation and hospitalization.    Thrombocytosis: Acute.   Improved.  Platelet 310.  Likely reactive.  HIV:   OnDescovy and Prezcobix at home..  Follows up with Dr. Baxter Flattery infectious disease as outpatient.  Anxiety/depression/bipolar disorder: Continue BuSpar, hydroxyzine, mirtazapine and Seroquel.  Continue trazodone.   Disposition.  At this time, patient is stable for disposition home.  Medical Consultants:    None.  Procedures:    None  Subjective:   Today, patient feels okay.  Wishes to go home.  No shortness of breath, cough fever or chills.  Discharge Exam:   Vitals:   04/15/19 0731 04/15/19 0828  BP: 132/79 133/83  Pulse: 78 92  Resp: 18 18  Temp:  98.7 F (37.1 C)  SpO2: 98% 99%   Vitals:   04/15/19 0004 04/15/19 0403 04/15/19 0731 04/15/19 0828  BP: 123/82 118/79 132/79  133/83  Pulse: 74 68 78 92  Resp: 18 16 18 18   Temp: 99.2 F (37.3 C) 98.6 F (37 C)  98.7 F (37.1 C)  TempSrc: Oral Oral  Oral  SpO2: 100% 95% 98% 99%  Weight:       General: Alert awake, not in obvious distress HENT: pupils equally reacting to light,  No scleral pallor or icterus noted. Oral mucosa is moist.  Chest:  Clear breath sounds.  Diminished breath sounds bilaterally. No crackles or wheezes.  CVS: S1 &S2 heard. No murmur.  Regular rate and rhythm. Abdomen: Soft, nontender, nondistended.  Bowel sounds are heard.   Extremities: No cyanosis, clubbing or edema.  Peripheral pulses are palpable. Psych: Alert, awake and oriented, normal mood CNS:  No cranial nerve deficits.  Power equal in all extremities.   Skin: Warm and dry.  No rashes noted.  The results of significant diagnostics from this hospitalization (including imaging, microbiology, ancillary and laboratory) are listed below for reference.     Diagnostic Studies:   DG Chest Port 1 View  Result Date: 04/11/2019 CLINICAL DATA:  Shortness of breath EXAM: PORTABLE CHEST 1 VIEW COMPARISON:  12/27/2018 FINDINGS: Chronic hyperinflation. There is no edema, consolidation, effusion, or pneumothorax. Full appearance at the inferior right hilum which does appear to be branching and when correlated with prior is attributed to vessels. Normal heart size and mediastinal contours. No acute osseous finding. IMPRESSION: 1. Stable compared to prior. 2. Hyperinflation. Electronically Signed   By: Monte Fantasia M.D.   On: 04/11/2019 05:03     Labs:   Basic Metabolic Panel: Recent Labs  Lab 04/11/19 0531 04/11/19 0531 04/11/19 0543 04/11/19 0543 04/12/19 0201 04/12/19 0201 04/13/19 0309 04/14/19 0230  NA 142  --  142  --  144  --  138 137  K 4.0   < > 3.3*   < > 3.9   < > 4.5 4.6  CL 105  --   --   --  112*  --  104 106  CO2 21*  --   --   --  21*  --  24 24  GLUCOSE 94  --   --   --  178*  --  157* 118*  BUN 6  --   --    --  11  --  12 20  CREATININE 0.85  --   --   --  0.91  --  0.89 0.96  CALCIUM 8.9  --   --   --  9.3  --  9.1 8.6*  MG  --   --   --   --  2.1  --  1.9 1.9  PHOS  --   --   --   --  2.3*  --   --   --    < > = values in this interval not displayed.   GFR Estimated Creatinine Clearance: 92 mL/min (by C-G formula based on SCr of 0.96 mg/dL). Liver Function Tests: No results for input(s): AST, ALT, ALKPHOS, BILITOT, PROT, ALBUMIN in the last 168 hours. No results for input(s): LIPASE, AMYLASE in the last 168 hours. No results for input(s): AMMONIA in the last 168 hours. Coagulation profile No results for input(s): INR, PROTIME in the last 168 hours.  CBC: Recent Labs  Lab 04/11/19 0531 04/11/19 0543 04/12/19 0201 04/13/19 0309  04/14/19 0230  WBC 5.8  --  17.3* 19.3* 12.2*  NEUTROABS 1.7  --   --   --   --   HGB 14.3 14.3 13.0 13.0 13.7  HCT 42.8 42.0 38.4* 38.7* 42.7  MCV 99.8  --  98.7 100.8* 105.2*  PLT 449*  --  376 353 310   Cardiac Enzymes: No results for input(s): CKTOTAL, CKMB, CKMBINDEX, TROPONINI in the last 168 hours. BNP: Invalid input(s): POCBNP CBG: No results for input(s): GLUCAP in the last 168 hours. D-Dimer No results for input(s): DDIMER in the last 72 hours. Hgb A1c No results for input(s): HGBA1C in the last 72 hours. Lipid Profile No results for input(s): CHOL, HDL, LDLCALC, TRIG, CHOLHDL, LDLDIRECT in the last 72 hours. Thyroid function studies No results for input(s): TSH, T4TOTAL, T3FREE, THYROIDAB in the last 72 hours.  Invalid input(s): FREET3 Anemia work up No results for input(s): VITAMINB12, FOLATE, FERRITIN, TIBC, IRON, RETICCTPCT in the last 72 hours. Microbiology Recent Results (from the past 240 hour(s))  SARS CORONAVIRUS 2 (TAT 6-24 HRS) Nasopharyngeal Nasopharyngeal Swab     Status: None   Collection Time: 04/11/19  6:18 AM   Specimen: Nasopharyngeal Swab  Result Value Ref Range Status   SARS Coronavirus 2 NEGATIVE NEGATIVE Final     Comment: (NOTE) SARS-CoV-2 target nucleic acids are NOT DETECTED. The SARS-CoV-2 RNA is generally detectable in upper and lower respiratory specimens during the acute phase of infection. Negative results do not preclude SARS-CoV-2 infection, do not rule out co-infections with other pathogens, and should not be used as the sole basis for treatment or other patient management decisions. Negative results must be combined with clinical observations, patient history, and epidemiological information. The expected result is Negative. Fact Sheet for Patients: SugarRoll.be Fact Sheet for Healthcare Providers: https://www.woods-mathews.com/ This test is not yet approved or cleared by the [place] FDA and  has been authorized for detection and/or diagnosis of SARS-CoV-2 by FDA under an Emergency Use Authorization (EUA). This EUA will remain  in effect (meaning this test can be used) for the duration of the COVID-19 declaration under Section 56 4(b)(1) of the Act, 21 U.S.C. section 360bbb-3(b)(1), unless the authorization is terminated or revoked sooner. Performed at [facility], [place] [address], [place], [place] [address]      Discharge Instructions:   Discharge Instructions    Diet - low sodium heart healthy   Complete by: As directed    Discharge instructions   Complete by: As directed    Take all the medications including inhaler without interruption.  Follow-up with your primary care physician in 1 week.  Seek medical attention for worsening symptoms.  Please do not smoke or drink   Increase activity slowly   Complete by: As directed      Allergies as of 04/15/2019      Reactions   Shellfish Allergy Anaphylaxis, Swelling   Shellfish Allergy Anaphylaxis      Medication List    STOP taking these medications   Descovy 200-25 MG tablet Generic drug: emtricitabine-tenofovir AF   Prezcobix 800-150 MG tablet Generic drug:  darunavir-cobicistat     TAKE these medications   albuterol 108 (90 Base) MCG/ACT inhaler Commonly known as: VENTOLIN HFA Inhale 2 puffs into the lungs every 4 (four) hours as needed for wheezing or shortness of breath.   busPIRone 10 MG tablet Commonly known as: BUSPAR Take 1 tablet (10 mg total) by mouth 3 (three) times daily.   fluticasone  110 MCG/ACT inhaler Commonly known as: FLOVENT HFA Inhale 2 puffs into the lungs 2 (two) times daily.   hydrOXYzine 25 MG tablet Commonly known as: ATARAX/VISTARIL Take 1 tablet (25 mg total) by mouth daily.   mirtazapine 15 MG tablet Commonly known as: REMERON Take 1 tablet (15 mg total) by mouth at bedtime.   predniSONE 10 MG (21) Tbpk tablet Commonly known as: STERAPRED UNI-PAK 21 TAB 3 tab orally daily x 2 days then 2 tab po daily x 2 days then 1 tab po daily x 2 days   QUEtiapine 50 MG tablet Commonly known as: SEROQUEL Take 1 tablet (50 mg total) by mouth 3 (three) times daily.   Symtuza 800-150-200-10 MG Tabs Generic drug: Darunavir-Cobicisctat-Emtricitabine-Tenofovir Alafenamide Take 1 tablet by mouth daily with breakfast.   trazodone 300 MG tablet Commonly known as: DESYREL Take 1 tablet (300 mg total) by mouth at bedtime as needed for sleep.        Time coordinating discharge: 39 minutes  Signed:  Carlette Palmatier  [facility] 04/15/2019, 10:13 AM

## 2019-04-15 NOTE — Progress Notes (Signed)
Discharge instructions gone over with patient. All questions answered. No IV to remove, Telemetry and continuous pulse ox discontinued. Patient clothing, medications, and bus passes returned to patient. Patient transported off unit to bus stop via wheelchair.  Gwendolyn Grant, RN

## 2019-04-23 ENCOUNTER — Ambulatory Visit: Payer: Self-pay | Admitting: Internal Medicine

## 2019-04-25 ENCOUNTER — Encounter: Payer: Self-pay | Admitting: Internal Medicine

## 2019-04-25 ENCOUNTER — Other Ambulatory Visit: Payer: Self-pay

## 2019-04-25 ENCOUNTER — Ambulatory Visit (INDEPENDENT_AMBULATORY_CARE_PROVIDER_SITE_OTHER): Payer: Self-pay | Admitting: Internal Medicine

## 2019-04-25 VITALS — BP 119/69 | HR 88 | Temp 98.6°F | Ht 69.0 in | Wt 163.0 lb

## 2019-04-25 DIAGNOSIS — B20 Human immunodeficiency virus [HIV] disease: Secondary | ICD-10-CM

## 2019-04-25 DIAGNOSIS — F319 Bipolar disorder, unspecified: Secondary | ICD-10-CM

## 2019-04-25 DIAGNOSIS — Z79899 Other long term (current) drug therapy: Secondary | ICD-10-CM
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
## 2019-04-26 LAB — T-HELPER CELL (CD4) - (RCID CLINIC ONLY)
CD4 % Helper T Cell: 38 % (ref 33–65)
CD4 T Cell Abs: 920 /uL (ref 400–1790)

## 2019-04-29 LAB — HIV-1 RNA QUANT-NO REFLEX-BLD
HIV 1 RNA Quant: <20 copies/mL — AB
HIV-1 RNA Quant, Log: <1.3 Log copies/mL — AB

## 2019-04-29 LAB — RPR: RPR Ser Ql: NONREACTIVE

## 2019-05-01 ENCOUNTER — Telehealth: Payer: Self-pay

## 2019-05-01 MED ORDER — SYMTUZA 800-150-200-10 MG PO TABS: 1.0000 | ORAL_TABLET | Freq: Every day | ORAL | 5 refills | Status: DC

## 2019-05-01 NOTE — Telephone Encounter (Signed)
Received call today from Ruth in regards to patient medication. States patient is taking Symtuza, Prezcobix, and Descovy.  Per last note patient is only supposed to be on Symtuza. Karn Pickler is aware will update patient's medication. Will have pharmacy cancel Prezcobix and Descovy prescriptions Aundria Rud, [place]

## 2019-05-25 ENCOUNTER — Other Ambulatory Visit: Payer: Self-pay

## 2019-05-25 DIAGNOSIS — Z20822 Contact with and (suspected) exposure to covid-19: Secondary | ICD-10-CM

## 2019-05-26 LAB — NOVEL CORONAVIRUS, NAA: SARS-CoV-2, NAA: NOT DETECTED

## 2019-06-08 ENCOUNTER — Other Ambulatory Visit: Payer: Self-pay

## 2019-06-08 ENCOUNTER — Encounter (HOSPITAL_COMMUNITY): Payer: Self-pay | Admitting: Emergency Medicine

## 2019-06-08 ENCOUNTER — Emergency Department (HOSPITAL_COMMUNITY)
Admission: EM | Admit: 2019-06-08 | Discharge: 2019-06-08 | Disposition: A | Discharge status: Home / Self Care | Payer: Self-pay | Attending: Emergency Medicine | Admitting: Emergency Medicine

## 2019-06-08 DIAGNOSIS — J4541 Moderate persistent asthma with (acute) exacerbation: Secondary | ICD-10-CM | POA: Insufficient documentation

## 2019-06-08 DIAGNOSIS — Z21 Asymptomatic human immunodeficiency virus [HIV] infection status: Secondary | ICD-10-CM | POA: Insufficient documentation

## 2019-06-08 DIAGNOSIS — F319 Bipolar disorder, unspecified: Secondary | ICD-10-CM | POA: Insufficient documentation

## 2019-06-08 DIAGNOSIS — Z79899 Other long term (current) drug therapy: Secondary | ICD-10-CM | POA: Insufficient documentation

## 2019-06-08 DIAGNOSIS — F17210 Nicotine dependence, cigarettes, uncomplicated: Secondary | ICD-10-CM | POA: Insufficient documentation

## 2019-06-08 DIAGNOSIS — R45851 Suicidal ideations: Secondary | ICD-10-CM | POA: Insufficient documentation

## 2019-06-08 DIAGNOSIS — Z20822 Contact with and (suspected) exposure to covid-19: Secondary | ICD-10-CM | POA: Insufficient documentation

## 2019-06-08 LAB — CBC WITH DIFFERENTIAL/PLATELET
Abs Immature Granulocytes: 0.07 10*3/uL (ref 0.00–0.07)
Basophils Absolute: 0.1 10*3/uL (ref 0.0–0.1)
Basophils Relative: 1 %
Eosinophils Absolute: 0.1 10*3/uL (ref 0.0–0.5)
Eosinophils Relative: 1 %
HCT: 44.5 % (ref 39.0–52.0)
Hemoglobin: 14.7 g/dL (ref 13.0–17.0)
Immature Granulocytes: 1 %
Lymphocytes Relative: 47 %
Lymphs Abs: 4 10*3/uL (ref 0.7–4.0)
MCH: 33 pg (ref 26.0–34.0)
MCHC: 33 g/dL (ref 30.0–36.0)
MCV: 99.8 fL (ref 80.0–100.0)
Monocytes Absolute: 0.4 10*3/uL (ref 0.1–1.0)
Monocytes Relative: 5 %
Neutro Abs: 3.7 10*3/uL (ref 1.7–7.7)
Neutrophils Relative %: 45 %
Platelets: 328 10*3/uL (ref 150–400)
RBC: 4.46 MIL/uL (ref 4.22–5.81)
RDW: 13.1 % (ref 11.5–15.5)
WBC: 8.4 10*3/uL (ref 4.0–10.5)
nRBC: 0 % (ref 0.0–0.2)

## 2019-06-08 LAB — RAPID URINE DRUG SCREEN, HOSP PERFORMED
Amphetamines: NOT DETECTED
Barbiturates: NOT DETECTED
Benzodiazepines: NOT DETECTED
Cocaine: POSITIVE — AB
Opiates: NOT DETECTED
Tetrahydrocannabinol: POSITIVE — AB

## 2019-06-08 LAB — ACETAMINOPHEN LEVEL: Acetaminophen (Tylenol), Serum: <10 ug/mL — ABNORMAL LOW (ref 10–30)

## 2019-06-08 LAB — RESPIRATORY PANEL BY RT PCR (FLU A&B, COVID)
Influenza A by PCR: NEGATIVE
Influenza B by PCR: NEGATIVE
SARS Coronavirus 2 by RT PCR: NEGATIVE

## 2019-06-08 LAB — COMPREHENSIVE METABOLIC PANEL
ALT: 15 U/L (ref 0–44)
AST: 26 U/L (ref 15–41)
Albumin: 4.2 g/dL (ref 3.5–5.0)
Alkaline Phosphatase: 61 U/L (ref 38–126)
Anion gap: 20 — ABNORMAL HIGH (ref 5–15)
BUN: 8 mg/dL (ref 6–20)
CO2: 18 mmol/L — ABNORMAL LOW (ref 22–32)
Calcium: 9 mg/dL (ref 8.9–10.3)
Chloride: 102 mmol/L (ref 98–111)
Creatinine, Ser: 0.92 mg/dL (ref 0.61–1.24)
GFR calc Af Amer: >60 mL/min (ref >60)
GFR calc non Af Amer: >60 mL/min (ref >60)
Glucose, Bld: 100 mg/dL — ABNORMAL HIGH (ref 70–99)
Potassium: 4 mmol/L (ref 3.5–5.1)
Sodium: 140 mmol/L (ref 135–145)
Total Bilirubin: 0.9 mg/dL (ref 0.3–1.2)
Total Protein: 7.3 g/dL (ref 6.5–8.1)

## 2019-06-08 LAB — SALICYLATE LEVEL: Salicylate Lvl: <7 mg/dL — ABNORMAL LOW (ref 7.0–30.0)

## 2019-06-08 LAB — ETHANOL: Alcohol, Ethyl (B): 136 mg/dL — ABNORMAL HIGH (ref <10)

## 2019-06-08 MED ORDER — LORAZEPAM 1 MG PO TABS: 0.0000 mg | ORAL_TABLET | Freq: Two times a day (BID) | ORAL | Status: DC

## 2019-06-08 MED ORDER — THIAMINE HCL 100 MG/ML IJ SOLN: 100.0000 mg | Freq: Every day | INTRAMUSCULAR | Status: DC

## 2019-06-08 MED ORDER — LORAZEPAM 2 MG/ML IJ SOLN
1.0000 mg | Freq: Once | INTRAMUSCULAR | Status: AC
  Administered 2019-06-08: 1 mg via INTRAVENOUS
  Filled 2019-06-08: qty 1

## 2019-06-08 MED ORDER — DARUN-COBIC-EMTRICIT-TENOFAF 800-150-200-10 MG PO TABS
1.0000 | ORAL_TABLET | Freq: Every day | ORAL | Status: DC
  Filled 2019-06-08: qty 1

## 2019-06-08 MED ORDER — MIRTAZAPINE 15 MG PO TABS: 15.0000 mg | ORAL_TABLET | Freq: Every day | ORAL | Status: DC

## 2019-06-08 MED ORDER — LORAZEPAM 2 MG/ML IJ SOLN: 0.0000 mg | Freq: Four times a day (QID) | INTRAMUSCULAR | Status: DC

## 2019-06-08 MED ORDER — BUSPIRONE HCL 10 MG PO TABS
10.0000 mg | ORAL_TABLET | Freq: Three times a day (TID) | ORAL | Status: DC
  Administered 2019-06-08: 10 mg via ORAL
  Filled 2019-06-08: qty 1

## 2019-06-08 MED ORDER — ALBUTEROL SULFATE HFA 108 (90 BASE) MCG/ACT IN AERS: 2.0000 | INHALATION_SPRAY | RESPIRATORY_TRACT | Status: DC | PRN

## 2019-06-08 MED ORDER — FLUTICASONE PROPIONATE HFA 110 MCG/ACT IN AERO: 2.0000 | INHALATION_SPRAY | Freq: Two times a day (BID) | RESPIRATORY_TRACT | Status: DC

## 2019-06-08 MED ORDER — LORAZEPAM 1 MG PO TABS: 0.0000 mg | ORAL_TABLET | Freq: Four times a day (QID) | ORAL | Status: DC

## 2019-06-08 MED ORDER — NICOTINE 21 MG/24HR TD PT24
21.0000 mg | MEDICATED_PATCH | Freq: Every day | TRANSDERMAL | Status: DC
  Administered 2019-06-08: 21 mg via TRANSDERMAL
  Filled 2019-06-08: qty 1

## 2019-06-08 MED ORDER — LACTATED RINGERS IV BOLUS
1000.0000 mL | Freq: Once | INTRAVENOUS | Status: AC
  Administered 2019-06-08: 1000 mL via INTRAVENOUS

## 2019-06-08 MED ORDER — THIAMINE HCL 100 MG PO TABS
100.0000 mg | ORAL_TABLET | Freq: Every day | ORAL | Status: DC
  Administered 2019-06-08: 100 mg via ORAL
  Filled 2019-06-08: qty 1

## 2019-06-08 MED ORDER — LORAZEPAM 2 MG/ML IJ SOLN: 0.0000 mg | Freq: Two times a day (BID) | INTRAMUSCULAR | Status: DC

## 2019-06-08 NOTE — ED Notes (Signed)
Belongings and valuables returned to pt. Bus pass provided. Community resources discussed.
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
## 2019-06-08 NOTE — ED Notes (Signed)
Belongings placed in locker number 6 

## 2019-06-08 NOTE — ED Triage Notes (Signed)
Per ems, pt from home. Pt reporting difficulty breathing, with chest pressure, not moving much air. 90% on room air with accessory muscle usage per ems. Absent breath sounds with ems initially. EMS gave 10 Albuterol, 125 Solumedrol, 2 Mag, 0.5 Atrovent. Pt sound improved since meds, wheezing now noted in upper lobes and decreased in lower lobes. 98% on 10L non rebreather. Hx of asthma, chronic bronchitis.   Pt reports SI with plan, reports alcohol and marijuana use tonight.   20g left forearm by ems

## 2019-06-08 NOTE — ED Provider Notes (Signed)
Patient psychiatrically cleared by [facility].  Recommending outpatient therapy.  He has a place to go, will provide bus pass and discharge   Wyvonnia Dusky, MD 06/08/19 1118
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
## 2019-06-08 NOTE — ED Notes (Signed)
Breakfast ordered 

## 2019-06-08 NOTE — ED Notes (Signed)
Belongings removed, changed into scrubs, wanded by security. Personal items placed in locker/sent to security.

## 2019-06-08 NOTE — ED Notes (Signed)
EDP at bedside  

## 2019-06-25 ENCOUNTER — Other Ambulatory Visit: Payer: Self-pay

## 2019-06-25 ENCOUNTER — Telehealth: Payer: Self-pay | Admitting: *Deleted

## 2019-06-25 ENCOUNTER — Other Ambulatory Visit: Payer: Self-pay | Admitting: *Deleted

## 2019-07-01 ENCOUNTER — Encounter: Payer: Self-pay | Admitting: *Deleted

## 2019-07-01 ENCOUNTER — Other Ambulatory Visit: Payer: Self-pay

## 2019-07-01 NOTE — Telephone Encounter (Signed)
Opened in ERROR

## 2019-07-01 NOTE — Telephone Encounter (Signed)
Order received on 06/25/19 by Dr. Kathline Magic to evaluate patient for [facility]).  Patient was evaluated on 06/25/19 for [facility]. Patient was consented to care at this time.   Frequency / Duration of [facility] visits: Effective: 21mo3 PRN's for complications with disease process/progression, medication changes or concerns   [facility] will assess for learning needs related to diagnosis and treatment regimen, provide education as needed, fill pill box if needed, address any barriers which may be preventing medication compliance, and communicating with care team including physician and case manager.   Individualized Plan Of Care 06/25/2019 to 09/23/2019   a. Type of service(s) and care to be delivered: RN Case Management  b. Frequency and duration of service: Effective 56mo3, 3 prns for complications  with disease process/progression, medication changes or  concerns . Visits/Contact may be conducted telephonically or in person to best suit the patient.  c. Activity restrictions: Pt may be up as tolerated and can safely ambulate without the need for a assistive device   d. Safety Measures: Standard Precautions/Infection Control   e. Service Objectives and Goals: Service Objectives are to assist the pt with HIV medication regimen adherence and staying in care with the Infectious  Disease Clinic by identifying barriers to care. RN will address the barriers that are identified by the patient. Current barriers to mental health, substance abuse and lack of housing.  f. Equipment required: No additional equipment needs at this time   g. Functional Limitations: Vision. Pt has corrective glasses that he wears   h. Rehabilitation potential: Guarded   i. Diet and Nutritional Needs: Regular Diet   j. Medications and treatments: Medications have been reconciled and reviewed and are a part of EPIC electronic file   k. Specific therapies if needed: RN   l. Pertinent diagnoses: HIV  disease,  Hx of medication NonCompliance, substance abuse , homelessness  m. Expected outcome: Guarded

## 2019-07-23 ENCOUNTER — Telehealth: Payer: Self-pay | Admitting: Pharmacist

## 2019-07-23 ENCOUNTER — Other Ambulatory Visit: Payer: Self-pay | Admitting: Pharmacist

## 2019-07-23 ENCOUNTER — Telehealth: Payer: Self-pay | Admitting: Pharmacy Technician

## 2019-07-23 DIAGNOSIS — J45909 Unspecified asthma, uncomplicated: Secondary | ICD-10-CM

## 2019-07-23 MED ORDER — ALBUTEROL SULFATE HFA 108 (90 BASE) MCG/ACT IN AERS: 2.0000 | INHALATION_SPRAY | RESPIRATORY_TRACT | 5 refills | Status: DC | PRN

## 2019-07-23 MED ORDER — BECLOMETHASONE DIPROPIONATE 80 MCG/ACT IN AERS: 1.0000 | INHALATION_SPRAY | Freq: Two times a day (BID) | RESPIRATORY_TRACT | 12 refills | Status: DC

## 2019-07-23 NOTE — Telephone Encounter (Signed)
[facility] Patient Advocate Encounter  Completed and sent patient assistance applications for inhalers.  Albuterol through [place] and QVAR through [employer].  Will continue to follow.  Venida Jarvis. [name] [place] Patient [facility] for Infectious Disease Phone: [phone] Fax:  [phone]

## 2019-07-23 NOTE — Telephone Encounter (Signed)
Pharmacy was asked to help with access and assistance for patient's inhalers.  He is currently prescribed albuterol PRN and Flovent. Noticed that Flovent (fluticasone) is contraindicated with his Symtuza medication for his HIV. The recommendation is to avoid coadministration due to potential for increased corticosteroid concentrations and risk for Cushing's syndrome. It appears this was started after a hospitalization earlier this year.  Will change patient's inhaler to Qvar (beclomethasone) which is the preferred inhaler with Symtuza. Inez Catalina will work on Arts administrator.

## 2019-07-25 ENCOUNTER — Other Ambulatory Visit: Payer: Self-pay | Admitting: Internal Medicine

## 2019-07-25 DIAGNOSIS — J45909 Unspecified asthma, uncomplicated: Secondary | ICD-10-CM

## 2019-07-25 MED ORDER — BECLOMETHASONE DIPROPIONATE 80 MCG/ACT IN AERS: 1.0000 | INHALATION_SPRAY | Freq: Two times a day (BID) | RESPIRATORY_TRACT | 12 refills | Status: DC

## 2019-07-25 MED ORDER — ALBUTEROL SULFATE HFA 108 (90 BASE) MCG/ACT IN AERS: 2.0000 | INHALATION_SPRAY | RESPIRATORY_TRACT | 5 refills | Status: DC | PRN

## 2019-08-08 NOTE — Telephone Encounter (Addendum)
[facility] Patient Advocate Encounter  [name] has approved QVAR inhaler as of 08/06/2019 through 08/04/2020.  They provided a UPS tracking number of [phone].  Their phone number is [phone].  [employer] has approved the needed Albuterol Inhaler 08/07/2019 through 08/05/2020.  We can expect delivery within 7-10 days.  Their phone number is [phone].  Both medications will be shipped to [facility] clinic.  Once they arrive case manager John [name] will take them to the patient.   John [name]. John [name] [place] Patient [facility] for Infectious Disease Phone: [phone] Fax:  [phone]

## 2019-08-16 ENCOUNTER — Telehealth: Payer: Self-pay | Admitting: *Deleted

## 2019-08-16 NOTE — Telephone Encounter (Addendum)
Thanks! John [name] will pick up morning of 06/04 and deliver to the patient. Patient was made aware as well.  Ventolin shipped from [place] on 08/15/2019 USPS tracking [id number]  usps usually takes significant amount of time to arrive, estimate 06/11

## 2019-08-16 NOTE — Telephone Encounter (Signed)
Patient's inhalers arrived to clinic from patient assistance. RN left voicemail letting him know they were available for pick up. 3 units Qvar 80 mcg (10.6 g inhaler) 120 metered inhalations. [facility] [id number] Exp 04/2020 Lot [id number]  Medication in pharmacy for pick up. Landis Gandy, RN

## 2019-08-22 NOTE — Telephone Encounter (Signed)
[facility] Patient Advocate Encounter  John [name] arrived to the clinic today for the patient. John [name] has been made aware and will deliver to the patient. He will give to the patient one month at a time to reduce the chance of the patient losing the medication.

## 2019-08-27 ENCOUNTER — Ambulatory Visit: Payer: Self-pay | Admitting: Internal Medicine

## 2019-09-10 ENCOUNTER — Ambulatory Visit (INDEPENDENT_AMBULATORY_CARE_PROVIDER_SITE_OTHER): Payer: Self-pay | Admitting: Internal Medicine

## 2019-09-10 ENCOUNTER — Other Ambulatory Visit: Payer: Self-pay

## 2019-09-10 ENCOUNTER — Emergency Department (HOSPITAL_COMMUNITY)
Admission: EM | Admit: 2019-09-10 | Discharge: 2019-09-10 | Disposition: A | Discharge status: Home / Self Care | Payer: Self-pay | Attending: Emergency Medicine | Admitting: Emergency Medicine

## 2019-09-10 ENCOUNTER — Encounter (HOSPITAL_COMMUNITY): Payer: Self-pay | Admitting: Emergency Medicine

## 2019-09-10 ENCOUNTER — Emergency Department (HOSPITAL_COMMUNITY): Payer: Self-pay

## 2019-09-10 ENCOUNTER — Encounter: Payer: Self-pay | Admitting: Internal Medicine

## 2019-09-10 VITALS — BP 118/74 | HR 99 | Wt 141.0 lb

## 2019-09-10 DIAGNOSIS — K029 Dental caries, unspecified: Secondary | ICD-10-CM

## 2019-09-10 DIAGNOSIS — Z79899 Other long term (current) drug therapy: Secondary | ICD-10-CM

## 2019-09-10 DIAGNOSIS — Z5321 Procedure and treatment not carried out due to patient leaving prior to being seen by health care provider: Secondary | ICD-10-CM | POA: Insufficient documentation

## 2019-09-10 DIAGNOSIS — R634 Abnormal weight loss: Secondary | ICD-10-CM

## 2019-09-10 DIAGNOSIS — R05 Cough: Secondary | ICD-10-CM | POA: Insufficient documentation

## 2019-09-10 DIAGNOSIS — B20 Human immunodeficiency virus [HIV] disease: Secondary | ICD-10-CM

## 2019-09-10 LAB — CBC WITH DIFFERENTIAL/PLATELET
Abs Immature Granulocytes: 0.02 10*3/uL (ref 0.00–0.07)
Basophils Absolute: 0 10*3/uL (ref 0.0–0.1)
Basophils Relative: 1 %
Eosinophils Absolute: 0.1 10*3/uL (ref 0.0–0.5)
Eosinophils Relative: 2 %
HCT: 47.3 % (ref 39.0–52.0)
Hemoglobin: 15.2 g/dL (ref 13.0–17.0)
Immature Granulocytes: 0 %
Lymphocytes Relative: 29 %
Lymphs Abs: 1.6 10*3/uL (ref 0.7–4.0)
MCH: 32.8 pg (ref 26.0–34.0)
MCHC: 32.1 g/dL (ref 30.0–36.0)
MCV: 102.2 fL — ABNORMAL HIGH (ref 80.0–100.0)
Monocytes Absolute: 0.6 10*3/uL (ref 0.1–1.0)
Monocytes Relative: 11 %
Neutro Abs: 3.2 10*3/uL (ref 1.7–7.7)
Neutrophils Relative %: 57 %
Platelets: 371 10*3/uL (ref 150–400)
RBC: 4.63 MIL/uL (ref 4.22–5.81)
RDW: 12.5 % (ref 11.5–15.5)
WBC: 5.5 10*3/uL (ref 4.0–10.5)
nRBC: 0 % (ref 0.0–0.2)

## 2019-09-10 LAB — COMPREHENSIVE METABOLIC PANEL
ALT: 24 U/L (ref 0–44)
AST: 32 U/L (ref 15–41)
Albumin: 3.6 g/dL (ref 3.5–5.0)
Alkaline Phosphatase: 64 U/L (ref 38–126)
Anion gap: 18 — ABNORMAL HIGH (ref 5–15)
BUN: 8 mg/dL (ref 6–20)
CO2: 19 mmol/L — ABNORMAL LOW (ref 22–32)
Calcium: 9.2 mg/dL (ref 8.9–10.3)
Chloride: 103 mmol/L (ref 98–111)
Creatinine, Ser: 1.12 mg/dL (ref 0.61–1.24)
GFR calc Af Amer: >60 mL/min (ref >60)
GFR calc non Af Amer: >60 mL/min (ref >60)
Glucose, Bld: 79 mg/dL (ref 70–99)
Potassium: 4.6 mmol/L (ref 3.5–5.1)
Sodium: 140 mmol/L (ref 135–145)
Total Bilirubin: 1.3 mg/dL — ABNORMAL HIGH (ref 0.3–1.2)
Total Protein: 7.2 g/dL (ref 6.5–8.1)

## 2019-09-10 LAB — T-HELPER CELL (CD4) - (RCID CLINIC ONLY)
CD4 % Helper T Cell: 33 % (ref 33–65)
CD4 T Cell Abs: 504 /uL (ref 400–1790)

## 2019-09-10 MED ORDER — AMOXICILLIN 500 MG PO CAPS
500.0000 mg | ORAL_CAPSULE | Freq: Three times a day (TID) | ORAL | 0 refills | Status: DC
Start: 2019-09-10 — End: 2019-12-28

## 2019-09-10 MED ORDER — DRONABINOL 5 MG PO CAPS: 5.0000 mg | ORAL_CAPSULE | Freq: Two times a day (BID) | ORAL | 1 refills | Status: DC
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
## 2019-09-10 NOTE — ED Triage Notes (Signed)
Reports recent dental infection, states cough with productive green to gray phlegm, pcp sent him over for chest xray, denies fevers or chills.

## 2019-09-14 LAB — CBC WITH DIFFERENTIAL/PLATELET
Absolute Monocytes: 477 cells/uL (ref 200–950)
Basophils Absolute: 30 cells/uL (ref 0–200)
Basophils Relative: 0.7 %
Eosinophils Absolute: 90 cells/uL (ref 15–500)
Eosinophils Relative: 2.1 %
HCT: 43.4 % (ref 38.5–50.0)
Hemoglobin: 15.1 g/dL (ref 13.2–17.1)
Lymphs Abs: 1088 cells/uL (ref 850–3900)
MCH: 33.9 pg — ABNORMAL HIGH (ref 27.0–33.0)
MCHC: 34.8 g/dL (ref 32.0–36.0)
MCV: 97.3 fL (ref 80.0–100.0)
MPV: 9.7 fL (ref 7.5–12.5)
Monocytes Relative: 11.1 %
Neutro Abs: 2614 cells/uL (ref 1500–7800)
Neutrophils Relative %: 60.8 %
Platelets: 364 10*3/uL (ref 140–400)
RBC: 4.46 10*6/uL (ref 4.20–5.80)
RDW: 11.7 % (ref 11.0–15.0)
Total Lymphocyte: 25.3 %
WBC: 4.3 10*3/uL (ref 3.8–10.8)

## 2019-09-14 LAB — COMPLETE METABOLIC PANEL WITH GFR
AG Ratio: 1.3 (calc) (ref 1.0–2.5)
ALT: 20 U/L (ref 9–46)
AST: 27 U/L (ref 10–40)
Albumin: 3.8 g/dL (ref 3.6–5.1)
Alkaline phosphatase (APISO): 67 U/L (ref 36–130)
BUN: 11 mg/dL (ref 7–25)
CO2: 21 mmol/L (ref 20–32)
Calcium: 9.2 mg/dL (ref 8.6–10.3)
Chloride: 102 mmol/L (ref 98–110)
Creat: 0.96 mg/dL (ref 0.60–1.35)
GFR, Est African American: 108 mL/min/{1.73_m2} (ref >=60)
GFR, Est Non African American: 93 mL/min/{1.73_m2} (ref >=60)
Globulin: 2.9 g/dL (calc) (ref 1.9–3.7)
Glucose, Bld: 74 mg/dL (ref 65–99)
Potassium: 4.8 mmol/L (ref 3.5–5.3)
Sodium: 139 mmol/L (ref 135–146)
Total Bilirubin: 0.7 mg/dL (ref 0.2–1.2)
Total Protein: 6.7 g/dL (ref 6.1–8.1)

## 2019-09-14 LAB — QUANTIFERON-TB GOLD PLUS
Mitogen-NIL: >10 IU/mL
NIL: 0.05 IU/mL
QuantiFERON-TB Gold Plus: NEGATIVE
TB1-NIL: 0.07 IU/mL
TB2-NIL: 0.16 IU/mL

## 2019-09-14 LAB — HIV-1 RNA QUANT-NO REFLEX-BLD
HIV 1 RNA Quant: <20 copies/mL
HIV-1 RNA Quant, Log: <1.3 Log copies/mL

## 2019-09-14 LAB — RPR: RPR Ser Ql: NONREACTIVE

## 2019-09-26 ENCOUNTER — Telehealth: Payer: Self-pay | Admitting: *Deleted

## 2019-09-26 NOTE — Telephone Encounter (Signed)
The intent of this communication is to inform the Health Care Team that this patient will be discharged from [facility]) with GOAL MET (Consistent viral suppression) Effective 09/24/2019 patient will be discharged and removed from [facility] active patient listing.

## 2019-10-02 ENCOUNTER — Telehealth: Payer: Self-pay

## 2019-10-02 NOTE — Telephone Encounter (Signed)
[place] to follow up on release that was faxed to office. CMA is unable to fax records due to communication error. Contacted RN who states she will call tomorrow morning if someone is able to pick up records in office.  Will leave records with front desk. [place]

## 2019-10-08 ENCOUNTER — Ambulatory Visit: Payer: Self-pay | Admitting: Internal Medicine

## 2019-10-08 ENCOUNTER — Ambulatory Visit: Payer: Self-pay

## 2019-10-11 ENCOUNTER — Other Ambulatory Visit: Payer: Self-pay

## 2019-10-11 ENCOUNTER — Encounter: Payer: Self-pay | Admitting: Internal Medicine

## 2019-10-11 ENCOUNTER — Ambulatory Visit (INDEPENDENT_AMBULATORY_CARE_PROVIDER_SITE_OTHER): Payer: Self-pay | Admitting: Internal Medicine

## 2019-10-11 VITALS — BP 121/80 | HR 85 | Wt 145.2 lb

## 2019-10-11 DIAGNOSIS — B20 Human immunodeficiency virus [HIV] disease: Secondary | ICD-10-CM

## 2019-10-11 DIAGNOSIS — K029 Dental caries, unspecified: Secondary | ICD-10-CM

## 2019-10-11 DIAGNOSIS — F319 Bipolar disorder, unspecified: Secondary | ICD-10-CM
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
## 2019-10-15 ENCOUNTER — Other Ambulatory Visit: Payer: Self-pay

## 2019-10-15 ENCOUNTER — Encounter (HOSPITAL_COMMUNITY): Payer: Self-pay | Admitting: Emergency Medicine

## 2019-10-15 ENCOUNTER — Emergency Department (HOSPITAL_COMMUNITY)
Admission: EM | Admit: 2019-10-15 | Discharge: 2019-10-15 | Disposition: A | Discharge status: Home / Self Care | Payer: Self-pay | Attending: Emergency Medicine | Admitting: Emergency Medicine

## 2019-10-15 DIAGNOSIS — Z79899 Other long term (current) drug therapy: Secondary | ICD-10-CM | POA: Insufficient documentation

## 2019-10-15 DIAGNOSIS — J45909 Unspecified asthma, uncomplicated: Secondary | ICD-10-CM | POA: Insufficient documentation

## 2019-10-15 DIAGNOSIS — Z5321 Procedure and treatment not carried out due to patient leaving prior to being seen by health care provider: Secondary | ICD-10-CM | POA: Insufficient documentation

## 2019-10-15 NOTE — ED Notes (Signed)
CALLED X3 FOR REGISTRATION AND VITALS, NO ANSWER

## 2019-10-15 NOTE — ED Triage Notes (Signed)
Patient arrives to ED via [facility] to triage with complaints of an asthma attack today. Per EMS patient stated he felt like he was blowing out more air that he was getting in. Patient was given 5mg  albuterol treatment. Pt states he just needs an inhaler to have with him at home.

## 2019-10-16 ENCOUNTER — Encounter: Payer: Self-pay | Admitting: Internal Medicine

## 2019-12-03 ENCOUNTER — Encounter: Payer: Self-pay | Admitting: *Deleted

## 2019-12-03 ENCOUNTER — Telehealth: Payer: Self-pay | Admitting: *Deleted

## 2019-12-03 NOTE — Congregational Nurse Program (Signed)
°  Dept: [facility] Nurse Program Note  Date of Encounter: 12/03/2019  Past Medical History: Past Medical History:  Diagnosis Date   Alcohol abuse    Alcoholism ([place])    Anxiety    Asthma    Asthma    Bipolar disorder ([place])    Chronic low back pain    Cocaine abuse ([place])    Depression    Gout    Gout    HIV (human immunodeficiency virus infection) ([place])    "dx'd ~ 2 yr ago" (09/29/2012)   HIV (human immunodeficiency virus infection) ([place])    HIV disease ([place])    Homelessness    Hypertension    Marijuana abuse    Mental disorder    Schizophrenia ([place])     Encounter Details:  CNP Questionnaire - 12/03/19 1055      Questionnaire   Patient Status Not Applicable    Race Black or African American    Location Patient Served At [employer] Not Applicable    Uninsured Uninsured (NEW 1x/quarter)    Food No food insecurities    Housing/Utilities No permanent housing    Transportation Yes, need transportation assistance    Interpersonal Safety No, do not feel physically and emotionally safe where you currently live    Medication No medication insecurities    Medical Provider Yes    Referrals Other   [facility]   ED Visit Averted Not Applicable    Life-Saving Intervention Made Not Applicable          Client was seen following recent hospital visit. Client reports he has a Case Worker and was called while in writer's office. CW Jerene Pitch [phone] with [facility] assists client with his medication monthly. Client has lost his medication organizer but has medications. Gave client a Systems analyst. He would like help with housing. Referred to [facility]. Client has an upcoming appt with [facility] [place] per his Case Worker.  Bernita Raisin. RN CN [phone]

## 2019-12-03 NOTE — Telephone Encounter (Signed)
Client's case worker called while client in office per client request. Per CW client has all of his medication's given to him monthly and has all appointments scheduled at this time.

## 2019-12-19 ENCOUNTER — Encounter (HOSPITAL_COMMUNITY): Payer: Self-pay

## 2019-12-19 ENCOUNTER — Emergency Department (HOSPITAL_COMMUNITY): Payer: Self-pay

## 2019-12-19 ENCOUNTER — Other Ambulatory Visit: Payer: Self-pay | Admitting: Internal Medicine

## 2019-12-19 ENCOUNTER — Other Ambulatory Visit: Payer: Self-pay

## 2019-12-19 ENCOUNTER — Emergency Department (HOSPITAL_COMMUNITY)
Admission: EM | Admit: 2019-12-19 | Discharge: 2019-12-20 | Disposition: A | Discharge status: Home / Self Care | Payer: Self-pay | Attending: Emergency Medicine | Admitting: Emergency Medicine

## 2019-12-19 DIAGNOSIS — M5127 Other intervertebral disc displacement, lumbosacral region: Secondary | ICD-10-CM | POA: Insufficient documentation

## 2019-12-19 DIAGNOSIS — J4551 Severe persistent asthma with (acute) exacerbation: Secondary | ICD-10-CM | POA: Insufficient documentation

## 2019-12-19 DIAGNOSIS — Z7951 Long term (current) use of inhaled steroids: Secondary | ICD-10-CM | POA: Insufficient documentation

## 2019-12-19 DIAGNOSIS — M545 Low back pain, unspecified: Secondary | ICD-10-CM

## 2019-12-19 DIAGNOSIS — F17210 Nicotine dependence, cigarettes, uncomplicated: Secondary | ICD-10-CM | POA: Insufficient documentation

## 2019-12-19 DIAGNOSIS — Z21 Asymptomatic human immunodeficiency virus [HIV] infection status: Secondary | ICD-10-CM | POA: Insufficient documentation

## 2019-12-19 DIAGNOSIS — M25551 Pain in right hip: Secondary | ICD-10-CM | POA: Insufficient documentation

## 2019-12-19 DIAGNOSIS — I10 Essential (primary) hypertension: Secondary | ICD-10-CM | POA: Insufficient documentation

## 2019-12-19 DIAGNOSIS — Z79899 Other long term (current) drug therapy: Secondary | ICD-10-CM | POA: Insufficient documentation

## 2019-12-19 LAB — COMPREHENSIVE METABOLIC PANEL
ALT: 15 U/L (ref 0–44)
AST: 21 U/L (ref 15–41)
Albumin: 3.3 g/dL — ABNORMAL LOW (ref 3.5–5.0)
Alkaline Phosphatase: 78 U/L (ref 38–126)
Anion gap: 13 (ref 5–15)
BUN: 16 mg/dL (ref 6–20)
CO2: 23 mmol/L (ref 22–32)
Calcium: 8.8 mg/dL — ABNORMAL LOW (ref 8.9–10.3)
Chloride: 101 mmol/L (ref 98–111)
Creatinine, Ser: 1.13 mg/dL (ref 0.61–1.24)
GFR calc non Af Amer: >60 mL/min (ref >60)
Glucose, Bld: 103 mg/dL — ABNORMAL HIGH (ref 70–99)
Potassium: 3.7 mmol/L (ref 3.5–5.1)
Sodium: 137 mmol/L (ref 135–145)
Total Bilirubin: 0.7 mg/dL (ref 0.3–1.2)
Total Protein: 6 g/dL — ABNORMAL LOW (ref 6.5–8.1)

## 2019-12-19 LAB — CBC WITH DIFFERENTIAL/PLATELET
Abs Immature Granulocytes: 0.01 10*3/uL (ref 0.00–0.07)
Basophils Absolute: 0 10*3/uL (ref 0.0–0.1)
Basophils Relative: 0 %
Eosinophils Absolute: 0.2 10*3/uL (ref 0.0–0.5)
Eosinophils Relative: 3 %
HCT: 39.1 % (ref 39.0–52.0)
Hemoglobin: 13 g/dL (ref 13.0–17.0)
Immature Granulocytes: 0 %
Lymphocytes Relative: 37 %
Lymphs Abs: 2 10*3/uL (ref 0.7–4.0)
MCH: 32.4 pg (ref 26.0–34.0)
MCHC: 33.2 g/dL (ref 30.0–36.0)
MCV: 97.5 fL (ref 80.0–100.0)
Monocytes Absolute: 0.7 10*3/uL (ref 0.1–1.0)
Monocytes Relative: 12 %
Neutro Abs: 2.5 10*3/uL (ref 1.7–7.7)
Neutrophils Relative %: 48 %
Platelets: 330 10*3/uL (ref 150–400)
RBC: 4.01 MIL/uL — ABNORMAL LOW (ref 4.22–5.81)
RDW: 12.2 % (ref 11.5–15.5)
WBC: 5.4 10*3/uL (ref 4.0–10.5)
nRBC: 0 % (ref 0.0–0.2)

## 2019-12-19 LAB — URINALYSIS, ROUTINE W REFLEX MICROSCOPIC
Bacteria, UA: NONE SEEN
Bilirubin Urine: NEGATIVE
Glucose, UA: NEGATIVE mg/dL
Hgb urine dipstick: NEGATIVE
Ketones, ur: NEGATIVE mg/dL
Leukocytes,Ua: NEGATIVE
Nitrite: NEGATIVE
Protein, ur: NEGATIVE mg/dL
Specific Gravity, Urine: 1.027 (ref 1.005–1.030)
pH: 5 (ref 5.0–8.0)

## 2019-12-19 LAB — LACTIC ACID, PLASMA: Lactic Acid, Venous: 2.1 mmol/L (ref 0.5–1.9)

## 2019-12-19 MED ORDER — ALBUTEROL SULFATE HFA 108 (90 BASE) MCG/ACT IN AERS
2.0000 | INHALATION_SPRAY | Freq: Once | RESPIRATORY_TRACT | Status: AC
  Administered 2019-12-19: 2 via RESPIRATORY_TRACT
  Filled 2019-12-19: qty 6.7

## 2019-12-19 MED ORDER — GADOBUTROL 1 MMOL/ML IV SOLN
5.5000 mL | Freq: Once | INTRAVENOUS | Status: AC | PRN
  Administered 2019-12-19: 5.5 mL via INTRAVENOUS

## 2019-12-19 MED ORDER — SODIUM CHLORIDE 0.9 % IV BOLUS
500.0000 mL | Freq: Once | INTRAVENOUS | Status: AC
  Administered 2019-12-19: 500 mL via INTRAVENOUS

## 2019-12-19 NOTE — Telephone Encounter (Signed)
[facility] Patient Advocate Encounter  Patient needs both inhalers refilled.  I called both and they will be shipped to [facility].  [name], QVAR inhaler will arrive in 5-7 days.  Their phone number is [phone].  [facility] Ventolin Inhaler will arrive 01/02/2020.  Their phone number is [phone].  Once they arrive, case manager [name] will take them to the patient.   John [name]. [name] [name] Patient [facility] Phone: [phone] Fax:  [phone]

## 2019-12-19 NOTE — ED Notes (Signed)
Pt ambulatory to bathroom without assistance.

## 2019-12-19 NOTE — ED Provider Notes (Signed)
Blood pressure (!) 101/56, pulse 73, temperature 98.8 F (37.1 C), temperature source Oral, resp. rate 18, weight 59 kg, SpO2 99 %.  Assuming care from Dr. Ralene Bathe.  In short, John [name] is a 49 y.o. male with a chief complaint of Asthma and Hip Pain .  Refer to the original H&P for additional details.  The current plan of care is to f/u on ESR and CRP. Doubt septic arthritis. Will follow labs and reassess.   01:40 AM  ESR and CRP are WNL. Exceedingly low suspicion for infectious etiology. Will give contact info for spine surgery for outpatient f/u. Discussed ED return precautions and PCP follow up plan.     Margette Fast, MD 12/20/19 [phone]
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
## 2019-12-19 NOTE — ED Triage Notes (Signed)
Pt c.o asthma flare up, no relief with inhaler. No sob or wheezing noted in triage. Pt also c.o right hip pain for the past 3 months. Denies injury or fall.

## 2019-12-20 LAB — URINE CULTURE: Culture: <10000 — AB

## 2019-12-20 LAB — C-REACTIVE PROTEIN: CRP: 1 mg/dL — ABNORMAL HIGH (ref <1.0)

## 2019-12-20 LAB — SEDIMENTATION RATE: Sed Rate: 6 mm/hr (ref 0–16)

## 2019-12-20 LAB — LACTIC ACID, PLASMA: Lactic Acid, Venous: 1.6 mmol/L (ref 0.5–1.9)

## 2019-12-20 MED ORDER — OXYCODONE-ACETAMINOPHEN 5-325 MG PO TABS: 1.0000 | ORAL_TABLET | Freq: Four times a day (QID) | ORAL | 0 refills | Status: DC | PRN

## 2019-12-20 MED ORDER — SENNOSIDES-DOCUSATE SODIUM 8.6-50 MG PO TABS: 1.0000 | ORAL_TABLET | Freq: Every evening | ORAL | 0 refills | Status: DC | PRN

## 2019-12-20 MED ORDER — DICLOFENAC SODIUM 1 % EX GEL: 2.0000 g | Freq: Four times a day (QID) | CUTANEOUS | 0 refills | Status: DC | PRN

## 2019-12-20 NOTE — Discharge Instructions (Signed)
You have been seen in the Emergency Department (ED)  today for back pain.  Your workup and exam have not shown any acute abnormalities and you are likely suffering from muscle strain or possible problems with your discs, but there is no treatment that will fix your symptoms at this time.  Please take Motrin (ibuprofen) as needed for your pain according to the instructions written on the box.  Alternatively, for the next five days you can take 600mg three times daily with meals (it may upset your stomach).  Please follow up with your doctor as soon as possible regarding today's ED visit and your back pain.  Return to the ED for worsening back pain, fever, weakness or numbness of either leg, or if you develop either (1) an inability to urinate or have bowel movements, or (2) loss of your ability to control your bathroom functions (if you start having "accidents"), or if you develop other new symptoms that concern you.  

## 2019-12-24 LAB — CULTURE, BLOOD (ROUTINE X 2)
Culture: NO GROWTH
Special Requests: ADEQUATE

## 2019-12-26 ENCOUNTER — Encounter (HOSPITAL_COMMUNITY): Payer: Self-pay

## 2019-12-26 ENCOUNTER — Emergency Department (HOSPITAL_COMMUNITY): Payer: Self-pay

## 2019-12-26 ENCOUNTER — Emergency Department (HOSPITAL_COMMUNITY)
Admission: EM | Admit: 2019-12-26 | Discharge: 2019-12-27 | Disposition: A | Discharge status: Other Acute Inpatient Hospital | Payer: Self-pay | Attending: Emergency Medicine | Admitting: Emergency Medicine

## 2019-12-26 DIAGNOSIS — F333 Major depressive disorder, recurrent, severe with psychotic symptoms: Secondary | ICD-10-CM

## 2019-12-26 DIAGNOSIS — F17210 Nicotine dependence, cigarettes, uncomplicated: Secondary | ICD-10-CM | POA: Insufficient documentation

## 2019-12-26 DIAGNOSIS — Z79899 Other long term (current) drug therapy: Secondary | ICD-10-CM | POA: Insufficient documentation

## 2019-12-26 DIAGNOSIS — R45851 Suicidal ideations: Secondary | ICD-10-CM | POA: Insufficient documentation

## 2019-12-26 DIAGNOSIS — R059 Cough, unspecified: Secondary | ICD-10-CM | POA: Insufficient documentation

## 2019-12-26 DIAGNOSIS — Z20822 Contact with and (suspected) exposure to covid-19: Secondary | ICD-10-CM | POA: Insufficient documentation

## 2019-12-26 DIAGNOSIS — R079 Chest pain, unspecified: Secondary | ICD-10-CM | POA: Insufficient documentation

## 2019-12-26 DIAGNOSIS — R0602 Shortness of breath: Secondary | ICD-10-CM | POA: Insufficient documentation

## 2019-12-26 DIAGNOSIS — J4541 Moderate persistent asthma with (acute) exacerbation: Secondary | ICD-10-CM | POA: Insufficient documentation

## 2019-12-26 LAB — RESPIRATORY PANEL BY RT PCR (FLU A&B, COVID)
Influenza A by PCR: NEGATIVE
Influenza B by PCR: NEGATIVE
SARS Coronavirus 2 by RT PCR: NEGATIVE

## 2019-12-26 LAB — COMPREHENSIVE METABOLIC PANEL
ALT: 16 U/L (ref 0–44)
AST: 27 U/L (ref 15–41)
Albumin: 4.2 g/dL (ref 3.5–5.0)
Alkaline Phosphatase: 67 U/L (ref 38–126)
Anion gap: 14 (ref 5–15)
BUN: 12 mg/dL (ref 6–20)
CO2: 24 mmol/L (ref 22–32)
Calcium: 9.6 mg/dL (ref 8.9–10.3)
Chloride: 106 mmol/L (ref 98–111)
Creatinine, Ser: 0.8 mg/dL (ref 0.61–1.24)
GFR, Estimated: >60 mL/min (ref >60)
Glucose, Bld: 94 mg/dL (ref 70–99)
Potassium: 4.2 mmol/L (ref 3.5–5.1)
Sodium: 144 mmol/L (ref 135–145)
Total Bilirubin: 0.7 mg/dL (ref 0.3–1.2)
Total Protein: 7.5 g/dL (ref 6.5–8.1)

## 2019-12-26 LAB — CBC WITH DIFFERENTIAL/PLATELET
Abs Immature Granulocytes: 0.02 10*3/uL (ref 0.00–0.07)
Basophils Absolute: 0 10*3/uL (ref 0.0–0.1)
Basophils Relative: 1 %
Eosinophils Absolute: 0.5 10*3/uL (ref 0.0–0.5)
Eosinophils Relative: 9 %
HCT: 44.2 % (ref 39.0–52.0)
Hemoglobin: 14.7 g/dL (ref 13.0–17.0)
Immature Granulocytes: 0 %
Lymphocytes Relative: 43 %
Lymphs Abs: 2.4 10*3/uL (ref 0.7–4.0)
MCH: 33 pg (ref 26.0–34.0)
MCHC: 33.3 g/dL (ref 30.0–36.0)
MCV: 99.1 fL (ref 80.0–100.0)
Monocytes Absolute: 0.5 10*3/uL (ref 0.1–1.0)
Monocytes Relative: 9 %
Neutro Abs: 2.2 10*3/uL (ref 1.7–7.7)
Neutrophils Relative %: 38 %
Platelets: 375 10*3/uL (ref 150–400)
RBC: 4.46 MIL/uL (ref 4.22–5.81)
RDW: 12.6 % (ref 11.5–15.5)
WBC: 5.7 10*3/uL (ref 4.0–10.5)
nRBC: 0 % (ref 0.0–0.2)

## 2019-12-26 LAB — TROPONIN I (HIGH SENSITIVITY)
Troponin I (High Sensitivity): 3 ng/L (ref <18)
Troponin I (High Sensitivity): <2 ng/L (ref <18)

## 2019-12-26 LAB — RAPID URINE DRUG SCREEN, HOSP PERFORMED
Amphetamines: NOT DETECTED
Barbiturates: NOT DETECTED
Benzodiazepines: NOT DETECTED
Cocaine: POSITIVE — AB
Opiates: NOT DETECTED
Tetrahydrocannabinol: NOT DETECTED

## 2019-12-26 LAB — D-DIMER, QUANTITATIVE: D-Dimer, Quant: 0.41 ug/mL-FEU (ref 0.00–0.50)

## 2019-12-26 LAB — POC SARS CORONAVIRUS 2 AG -  ED: SARS Coronavirus 2 Ag: NEGATIVE

## 2019-12-26 LAB — ETHANOL: Alcohol, Ethyl (B): <10 mg/dL (ref <10)

## 2019-12-26 MED ORDER — LORAZEPAM 1 MG PO TABS: 0.0000 mg | ORAL_TABLET | Freq: Two times a day (BID) | ORAL | Status: DC

## 2019-12-26 MED ORDER — QUETIAPINE FUMARATE 50 MG PO TABS
50.0000 mg | ORAL_TABLET | Freq: Three times a day (TID) | ORAL | Status: DC
  Administered 2019-12-26 – 2019-12-27 (×3): 50 mg via ORAL
  Filled 2019-12-26 (×3): qty 1

## 2019-12-26 MED ORDER — LORAZEPAM 2 MG/ML IJ SOLN
0.0000 mg | Freq: Four times a day (QID) | INTRAMUSCULAR | Status: DC
  Administered 2019-12-26: 2 mg via INTRAVENOUS
  Filled 2019-12-26: qty 2
  Filled 2019-12-26: qty 1

## 2019-12-26 MED ORDER — THIAMINE HCL 100 MG PO TABS
100.0000 mg | ORAL_TABLET | Freq: Every day | ORAL | Status: DC
  Administered 2019-12-26 – 2019-12-27 (×2): 100 mg via ORAL
  Filled 2019-12-26 (×2): qty 1

## 2019-12-26 MED ORDER — ALBUTEROL SULFATE HFA 108 (90 BASE) MCG/ACT IN AERS: 2.0000 | INHALATION_SPRAY | RESPIRATORY_TRACT | Status: DC | PRN

## 2019-12-26 MED ORDER — METHYLPREDNISOLONE SODIUM SUCC 125 MG IJ SOLR
125.0000 mg | Freq: Once | INTRAMUSCULAR | Status: AC
  Administered 2019-12-26: 125 mg via INTRAVENOUS
  Filled 2019-12-26: qty 2

## 2019-12-26 MED ORDER — BECLOMETHASONE DIPROPIONATE 80 MCG/ACT IN AERS: 1.0000 | INHALATION_SPRAY | Freq: Two times a day (BID) | RESPIRATORY_TRACT | Status: DC

## 2019-12-26 MED ORDER — IPRATROPIUM-ALBUTEROL 0.5-2.5 (3) MG/3ML IN SOLN
3.0000 mL | Freq: Once | RESPIRATORY_TRACT | Status: AC
  Administered 2019-12-26: 3 mL via RESPIRATORY_TRACT
  Filled 2019-12-26: qty 3

## 2019-12-26 MED ORDER — IPRATROPIUM-ALBUTEROL 0.5-2.5 (3) MG/3ML IN SOLN: 3.0000 mL | Freq: Four times a day (QID) | RESPIRATORY_TRACT | Status: DC | PRN

## 2019-12-26 MED ORDER — MAGNESIUM SULFATE 2 GM/50ML IV SOLN
2.0000 g | Freq: Once | INTRAVENOUS | Status: AC
  Administered 2019-12-26: 2 g via INTRAVENOUS
  Filled 2019-12-26: qty 50

## 2019-12-26 MED ORDER — BUSPIRONE HCL 10 MG PO TABS
10.0000 mg | ORAL_TABLET | Freq: Three times a day (TID) | ORAL | Status: DC
  Administered 2019-12-26 – 2019-12-27 (×3): 10 mg via ORAL
  Filled 2019-12-26 (×3): qty 1

## 2019-12-26 MED ORDER — SENNOSIDES-DOCUSATE SODIUM 8.6-50 MG PO TABS: 1.0000 | ORAL_TABLET | Freq: Every evening | ORAL | Status: DC | PRN

## 2019-12-26 MED ORDER — ALBUTEROL SULFATE HFA 108 (90 BASE) MCG/ACT IN AERS
INHALATION_SPRAY | RESPIRATORY_TRACT | Status: AC
  Administered 2019-12-26: 4 via RESPIRATORY_TRACT
  Filled 2019-12-26: qty 6.7

## 2019-12-26 MED ORDER — MIRTAZAPINE 30 MG PO TABS
30.0000 mg | ORAL_TABLET | Freq: Every day | ORAL | Status: DC
  Administered 2019-12-26: 30 mg via ORAL
  Filled 2019-12-26: qty 1

## 2019-12-26 MED ORDER — THIAMINE HCL 100 MG/ML IJ SOLN: 100.0000 mg | Freq: Every day | INTRAMUSCULAR | Status: DC

## 2019-12-26 MED ORDER — PREDNISONE 20 MG PO TABS
40.0000 mg | ORAL_TABLET | Freq: Every day | ORAL | Status: DC
  Administered 2019-12-27: 40 mg via ORAL
  Filled 2019-12-26: qty 2

## 2019-12-26 MED ORDER — FLUTICASONE PROPIONATE HFA 44 MCG/ACT IN AERO
2.0000 | INHALATION_SPRAY | Freq: Two times a day (BID) | RESPIRATORY_TRACT | Status: DC
  Administered 2019-12-26 – 2019-12-27 (×2): 2 via RESPIRATORY_TRACT
  Filled 2019-12-26: qty 10.6

## 2019-12-26 MED ORDER — ALBUTEROL (5 MG/ML) CONTINUOUS INHALATION SOLN: 10.0000 mg/h | INHALATION_SOLUTION | Freq: Once | RESPIRATORY_TRACT | Status: DC

## 2019-12-26 MED ORDER — LORAZEPAM 1 MG PO TABS
0.0000 mg | ORAL_TABLET | Freq: Four times a day (QID) | ORAL | Status: DC
  Administered 2019-12-26: 1 mg via ORAL
  Filled 2019-12-26: qty 1

## 2019-12-26 MED ORDER — DARUN-COBIC-EMTRICIT-TENOFAF 800-150-200-10 MG PO TABS
1.0000 | ORAL_TABLET | Freq: Every day | ORAL | Status: DC
  Administered 2019-12-27: 1 via ORAL
  Filled 2019-12-26: qty 1

## 2019-12-26 MED ORDER — LORAZEPAM 2 MG/ML IJ SOLN: 0.0000 mg | Freq: Two times a day (BID) | INTRAMUSCULAR | Status: DC

## 2019-12-26 MED ORDER — DRONABINOL 5 MG PO CAPS
5.0000 mg | ORAL_CAPSULE | Freq: Two times a day (BID) | ORAL | Status: DC
  Administered 2019-12-26 – 2019-12-27 (×2): 5 mg via ORAL
  Filled 2019-12-26 (×3): qty 1

## 2019-12-26 MED ORDER — LORAZEPAM 2 MG/ML IJ SOLN
1.0000 mg | Freq: Once | INTRAMUSCULAR | Status: AC
  Administered 2019-12-26: 1 mg via INTRAVENOUS
  Filled 2019-12-26: qty 1

## 2019-12-26 NOTE — ED Notes (Signed)
Pt maintains Spo2 of 94% on room air while ambulating

## 2019-12-26 NOTE — ED Notes (Signed)
Pt able to maintain O2 sats without supplemental oxygen

## 2019-12-26 NOTE — Progress Notes (Signed)
CAT held at this time due to no recent COVID results. Alb MDI given in place. Will reassess after results of COVID returned.

## 2019-12-26 NOTE — ED Notes (Signed)
Pt belongings removed from room. Security made aware
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
## 2019-12-26 NOTE — ED Notes (Signed)
Pt changed into purple scrubs 

## 2019-12-26 NOTE — ED Notes (Signed)
Will update vitals when pt finishes supper

## 2019-12-26 NOTE — ED Triage Notes (Signed)
SOB x 2 weeks. Rescue inhaler not helping at home. EMS states room air sats in mid 70's. Given 2 dua nebs en route. Pt tripoding and labored on non-rebreather
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
## 2019-12-27 ENCOUNTER — Other Ambulatory Visit: Payer: Self-pay

## 2019-12-27 ENCOUNTER — Ambulatory Visit (HOSPITAL_COMMUNITY)
Admission: EM | Admit: 2019-12-27 | Discharge: 2019-12-28 | Disposition: A | Discharge status: Home / Self Care | Payer: No Payment, Other | Attending: Psychiatry | Admitting: Psychiatry

## 2019-12-27 ENCOUNTER — Encounter (HOSPITAL_COMMUNITY): Payer: Self-pay

## 2019-12-27 DIAGNOSIS — M109 Gout, unspecified: Secondary | ICD-10-CM | POA: Insufficient documentation

## 2019-12-27 DIAGNOSIS — R45851 Suicidal ideations: Secondary | ICD-10-CM | POA: Insufficient documentation

## 2019-12-27 DIAGNOSIS — Z21 Asymptomatic human immunodeficiency virus [HIV] infection status: Secondary | ICD-10-CM | POA: Insufficient documentation

## 2019-12-27 DIAGNOSIS — R45850 Homicidal ideations: Secondary | ICD-10-CM | POA: Insufficient documentation

## 2019-12-27 DIAGNOSIS — G8929 Other chronic pain: Secondary | ICD-10-CM | POA: Insufficient documentation

## 2019-12-27 DIAGNOSIS — F319 Bipolar disorder, unspecified: Secondary | ICD-10-CM | POA: Insufficient documentation

## 2019-12-27 DIAGNOSIS — F419 Anxiety disorder, unspecified: Secondary | ICD-10-CM | POA: Insufficient documentation

## 2019-12-27 DIAGNOSIS — Z7289 Other problems related to lifestyle: Secondary | ICD-10-CM | POA: Insufficient documentation

## 2019-12-27 DIAGNOSIS — F4321 Adjustment disorder with depressed mood: Secondary | ICD-10-CM | POA: Insufficient documentation

## 2019-12-27 DIAGNOSIS — R059 Cough, unspecified: Secondary | ICD-10-CM | POA: Insufficient documentation

## 2019-12-27 DIAGNOSIS — F1210 Cannabis abuse, uncomplicated: Secondary | ICD-10-CM | POA: Insufficient documentation

## 2019-12-27 DIAGNOSIS — F1410 Cocaine abuse, uncomplicated: Secondary | ICD-10-CM | POA: Insufficient documentation

## 2019-12-27 DIAGNOSIS — F17210 Nicotine dependence, cigarettes, uncomplicated: Secondary | ICD-10-CM | POA: Insufficient documentation

## 2019-12-27 DIAGNOSIS — R0602 Shortness of breath: Secondary | ICD-10-CM | POA: Insufficient documentation

## 2019-12-27 DIAGNOSIS — M545 Low back pain, unspecified: Secondary | ICD-10-CM | POA: Insufficient documentation

## 2019-12-27 DIAGNOSIS — F209 Schizophrenia, unspecified: Secondary | ICD-10-CM | POA: Insufficient documentation

## 2019-12-27 DIAGNOSIS — R454 Irritability and anger: Secondary | ICD-10-CM | POA: Insufficient documentation

## 2019-12-27 DIAGNOSIS — Z5900 Homelessness unspecified: Secondary | ICD-10-CM | POA: Insufficient documentation

## 2019-12-27 DIAGNOSIS — J45909 Unspecified asthma, uncomplicated: Secondary | ICD-10-CM | POA: Insufficient documentation

## 2019-12-27 DIAGNOSIS — F1910 Other psychoactive substance abuse, uncomplicated: Secondary | ICD-10-CM | POA: Insufficient documentation

## 2019-12-27 DIAGNOSIS — R0789 Other chest pain: Secondary | ICD-10-CM | POA: Insufficient documentation

## 2019-12-27 DIAGNOSIS — F439 Reaction to severe stress, unspecified: Secondary | ICD-10-CM | POA: Insufficient documentation

## 2019-12-27 MED ORDER — QUETIAPINE FUMARATE 100 MG PO TABS
100.0000 mg | ORAL_TABLET | Freq: Every day | ORAL | Status: DC
  Administered 2019-12-27: 100 mg via ORAL
  Filled 2019-12-27: qty 1

## 2019-12-27 MED ORDER — TRAZODONE HCL 150 MG PO TABS
300.0000 mg | ORAL_TABLET | Freq: Every day | ORAL | Status: DC
  Administered 2019-12-27: 300 mg via ORAL
  Filled 2019-12-27: qty 2

## 2019-12-27 MED ORDER — MIRTAZAPINE 15 MG PO TBDP
30.0000 mg | ORAL_TABLET | Freq: Every day | ORAL | Status: DC
  Administered 2019-12-27: 30 mg via ORAL
  Filled 2019-12-27: qty 2

## 2019-12-27 MED ORDER — DARUN-COBIC-EMTRICIT-TENOFAF 800-150-200-10 MG PO TABS
1.0000 | ORAL_TABLET | Freq: Every day | ORAL | Status: DC
  Filled 2019-12-27 (×2): qty 1

## 2019-12-27 MED ORDER — ACETAMINOPHEN 325 MG PO TABS: 650.0000 mg | ORAL_TABLET | Freq: Four times a day (QID) | ORAL | Status: DC | PRN

## 2019-12-27 MED ORDER — QUETIAPINE FUMARATE 50 MG PO TABS
50.0000 mg | ORAL_TABLET | Freq: Three times a day (TID) | ORAL | Status: DC
  Administered 2019-12-27 – 2019-12-28 (×3): 50 mg via ORAL
  Filled 2019-12-27 (×3): qty 1

## 2019-12-27 MED ORDER — ALUM & MAG HYDROXIDE-SIMETH 200-200-20 MG/5ML PO SUSP: 30.0000 mL | ORAL | Status: DC | PRN

## 2019-12-27 MED ORDER — BUSPIRONE HCL 15 MG PO TABS
15.0000 mg | ORAL_TABLET | Freq: Two times a day (BID) | ORAL | Status: DC
  Administered 2019-12-27 – 2019-12-28 (×2): 15 mg via ORAL
  Filled 2019-12-27 (×2): qty 1

## 2019-12-27 MED ORDER — MAGNESIUM HYDROXIDE 400 MG/5ML PO SUSP: 30.0000 mL | Freq: Every day | ORAL | Status: DC | PRN

## 2019-12-27 MED ORDER — TRAZODONE HCL 50 MG PO TABS: 50.0000 mg | ORAL_TABLET | Freq: Every evening | ORAL | Status: DC | PRN

## 2019-12-27 MED ORDER — HALOPERIDOL 5 MG PO TABS: 5.0000 mg | ORAL_TABLET | Freq: Once | ORAL | Status: DC

## 2019-12-27 NOTE — ED Provider Notes (Signed)
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 Final  . RDW 09/10/2019 12.5  11.5 - 15.5 % Final  . Platelets 09/10/2019 371  150 - 400 K/uL Final  . nRBC 09/10/2019 0.0  0.0 - 0.2 % Final  .  Neutrophils Relative % 09/10/2019 57  % Final  . Neutro Abs 09/10/2019 3.2  1.7 - 7.7 K/uL Final  . Lymphocytes Relative 09/10/2019 29  % Final  . Lymphs Abs 09/10/2019 1.6  0.7 - 4.0 K/uL Final  . Monocytes Relative  09/10/2019 11  % Final  . Monocytes Absolute 09/10/2019 0.6  0.1 - 1.0 K/uL Final  . Eosinophils Relative 09/10/2019 2  % Final  . Eosinophils Absolute 09/10/2019 0.1  0.0 - 0.5 K/uL Final  . Basophils Relative 09/10/2019 1  % Final  . Basophils Absolute 09/10/2019 0.0  0.0 - 0.1 K/uL Final  . Immature Granulocytes 09/10/2019 0  % Final  . Abs Immature Granulocytes 09/10/2019 0.02  0.00 - 0.07 K/uL Final   Performed at [facility], [employer] [address] [place], [place] [address]  Office Visit on 09/10/2019  Component Date Value Ref Range Status  . WBC 09/10/2019 4.3  3.8 - 10.8 Thousand/uL Final  . RBC 09/10/2019 4.46  4.20 - 5.80 Million/uL Final  . Hemoglobin 09/10/2019 15.1  13.2 - 17.1 g/dL Final  . HCT 09/10/2019 43.4  38 - 50 % Final  . MCV 09/10/2019 97.3  80.0 - 100.0 fL Final  . MCH 09/10/2019 33.9* 27.0 - 33.0 pg Final  . MCHC 09/10/2019 34.8  32.0 - 36.0 g/dL Final  . RDW 09/10/2019 11.7  11.0 - 15.0 % Final  . Platelets 09/10/2019 364  140 - 400 Thousand/uL Final  . MPV 09/10/2019 9.7  7.5 - 12.5 fL Final  . Neutro Abs 09/10/2019 2,614  1,500 - 7,800 cells/uL Final  . Lymphs Abs 09/10/2019 1,088  850 - 3,900 cells/uL Final  . Absolute Monocytes 09/10/2019 477  200 - 950 cells/uL Final  . Eosinophils Absolute 09/10/2019 90  15.0 - 500.0 cells/uL Final  . Basophils Absolute 09/10/2019 30  0.0 - 200.0 cells/uL Final  . Neutrophils Relative % 09/10/2019 60.8  % Final  . Total Lymphocyte 09/10/2019 25.3  % Final  . Monocytes Relative 09/10/2019 11.1  % Final  . Eosinophils Relative 09/10/2019 2.1  % Final  . Basophils Relative 09/10/2019 0.7  % Final  . Glucose, Bld 09/10/2019 74  65 - 99 mg/dL Final   Comment: .            Fasting reference interval .    . BUN 09/10/2019 11  7 - 25 mg/dL Final  . Creat 09/10/2019 0.96  0.60 - 1.35 mg/dL Final  . GFR, Est Non African American 09/10/2019 93  > OR = 60 mL/min/1.14m2 Final  . GFR, Est African American 09/10/2019 108  > OR = 60 mL/min/1.13m2 Final  . BUN/Creatinine Ratio 96/78/9381 NOT APPLICABLE  6 - 22 (calc) Final  . Sodium 09/10/2019 139  135 - 146 mmol/L Final  . Potassium 09/10/2019 4.8  3.5 - 5.3 mmol/L Final  . Chloride 09/10/2019 102  98 - 110 mmol/L Final  . CO2 09/10/2019 21  20 - 32 mmol/L Final  . Calcium 09/10/2019 9.2  8.6 - 10.3 mg/dL Final  . Total Protein 09/10/2019 6.7  6.1 - 8.1 g/dL Final  . Albumin 09/10/2019 3.8  3.6 - 5.1 g/dL Final  . Globulin 09/10/2019 2.9  1.9 - 3.7 g/dL (calc) Final  . AG Ratio 09/10/2019 1.3  1.0 - 2.5 (calc) Final  . Total Bilirubin 09/10/2019 0.7  0.2 - 1.2 mg/dL Final  . Alkaline phosphatase (APISO) 09/10/2019 67  36 - 130 U/L Final  . AST 09/10/2019 27  10 - 40 U/L Final  . ALT 09/10/2019 20  9 - 46 U/L Final  . HIV 1 RNA Quant 09/10/2019 <20 NOT DETECTED  NOT DETECT copies/mL Final  . HIV-1 RNA Quant, Log 09/10/2019 <1.30 NOT DETECTED  NOT DETECT Log  copies/mL Final   Comment: . This test was performed using Real-Time Polymerase Chain Reaction. . Reportable Range: 20 copies/mL to 10,000,000 copies/mL (1.30 log copies/mL to 7.00 log copies/mL).   . CD4 T Cell Abs 09/10/2019 504  400 - 1,790 /uL Final  . CD4 % Helper T Cell 09/10/2019 33  33 - 65 % Final   Performed at [facility], [place] [address], [place], [place] [address]  . RPR Ser Ql 09/10/2019 NON-REACTIVE  NON-REACTI Final  . QuantiFERON-TB Gold Plus 09/10/2019 NEGATIVE  NEGATIVE Final   Comment: Negative test result. M. tuberculosis complex  infection unlikely.   Marland Kitchen NIL 09/10/2019 0.05  IU/mL Final  . Mitogen-NIL 09/10/2019 >10.00  IU/mL Final  . TB1-NIL 09/10/2019 0.07  IU/mL Final  . TB2-NIL 09/10/2019 0.16  IU/mL Final   Comment: . The Nil tube value  reflects the background interferon gamma immune response of the patient's blood sample. This value has been subtracted from the patient's displayed TB and Mitogen results. . Lower than expected results with the Mitogen tube prevent false-negative Quantiferon readings by detecting a patient with a potential immune suppressive condition and/or suboptimal pre-analytical specimen handling. . The TB1 Antigen tube is coated with the M. tuberculosis-specific antigens designed to elicit responses from TB antigen primed CD4+ helper T-lymphocytes. . The TB2 Antigen tube is coated with the M. tuberculosis-specific antigens designed to elicit responses from TB antigen primed CD4+ helper and CD8+ cytotoxic T-lymphocytes. . For additional information, please refer to https://education.questdiagnostics.com/faq/FAQ204 (This link is being provided for informational/ educational purposes only.) .     Allergies: Shellfish allergy and Shellfish allergy  PTA Medications: (Not in a hospital admission)   Medical Decision Making  Patient was medically cleared in Emergency Deparment. Discussed restarting medications. Patient in agreement with plan.  Medication adjusted to reflect home dosages per PTA medication list.   -Buspar 15mg  BID  -Quetiapine 100mg  HS  -Quetiapine 50mg  TID  -Mirtazapine 30mg  HS  -Trazodone 300mg  HS  -Symteza 1 TAB w/ breakfast  There are no withdrawal symptoms and patient is currently on observation with expected discharge in the a.m. Patient to be transported by case worker [name] with [facility] [phone]   Recommendations  Based on my evaluation the patient does not appear to have an emergency medical condition.  Pt to remain in continuous assessment area of [place] overnight for treatment and stabilization. The treatment team will determine disposition at that time. Case reviewed with Dr. Hampton Abbot.  Inda Merlin, NP 12/27/19  4:43  PM

## 2019-12-27 NOTE — ED Notes (Signed)
Pt sleeping at present, no distress noted, calm & cooperative, monitoring for safety.

## 2019-12-27 NOTE — ED Notes (Signed)
Pt off unit to [facility] per provider. Pt alert, calm, cooperative, no s/s of distress. DC information and belongings given to [facility] for facility. Pt ambulatory off unit , escorted by NT. Pt transported by [facility]

## 2019-12-27 NOTE — ED Notes (Signed)
Locker 5.

## 2019-12-27 NOTE — ED Provider Notes (Signed)
Emergency Medicine Observation Re-evaluation Note  Brandt Chaney is a 49 y.o. male, seen on rounds today.  Pt initially presented to the ED for complaints of Shortness of Breath Currently, the patient is resting.  Physical Exam  BP 111/72   Pulse 78   Temp 98.2 F (36.8 C) (Oral)   Resp 18   SpO2 94%  Physical Exam CONSTITUTIONAL: Well-appearing, NAD NEURO: Sleeping ENT/NECK:  supple, no JVD CARDIO: Regular rate, well-perfused PULM: No increased work of breathing GI/GU: Nondistended MSK/SPINE:  No gross deformities, no edema SKIN:  no rash, atraumatic PSYCH: Deferred  ED Course / MDM  EKG:EKG Interpretation  Date/Time:  Wednesday December 26 2019 12:16:49 EDT Ventricular Rate:  98 PR Interval:    QRS Duration: 86 QT Interval:  343 QTC Calculation: 438 R Axis:     Text Interpretation: Sinus rhythm Confirmed by Quintella Reichert [phone]) on 12/26/2019 12:33:13 PM    I have reviewed the labs performed to date as well as medications administered while in observation.  Recent changes in the last 24 hours include none.  Plan  Current plan is for inpatient psychiatric placement. Patient is not under full IVC at this time.   Maudie Flakes, MD 12/27/19 [phone]

## 2019-12-27 NOTE — BH Assessment (Signed)
[place] Assessment Progress Note  Per Letitia Libra, RN, pt is to be is transferred to the [facility].  Marvia Pickles, NP agrees to accept pt.  Please call report to [phone].  Pt is to be transported via [facility].  EDP Gerlene Fee, MD and pt's nurse, Eustaquio Maize, have been notified.   Jalene Mullet, [place] Coordinator [phone]

## 2019-12-27 NOTE — ED Notes (Addendum)
Pt admitted to continuous assessment endorsing SI and AH. Pt states, "I just have a lot going on in my head right now. I'm hearing voices telling me to end my life, jump off a bridge, do whatever it takes because your daughter died and you need to die too". Pt anxious and easily agitated. UDS positive for cocaine. Able to be redirected. Skin assessment completed. Will monitor for safety.

## 2019-12-27 NOTE — ED Notes (Signed)
Meal given

## 2019-12-28 NOTE — Discharge Instructions (Signed)

## 2019-12-28 NOTE — ED Notes (Signed)
Pt A&O x3. Continue to endorse passive SI no plan at current. Pt states, "I just have a lot going on right now. But I feel better today". Support given. Safety maintained.

## 2019-12-28 NOTE — ED Notes (Signed)
Rasin bran and milk given

## 2019-12-28 NOTE — ED Provider Notes (Signed)
FBC/OBS ASAP Discharge Summary  Date and Time: 12/28/2019 10:08 AM  Name: John [name]  MRN:  [id number]   Discharge Diagnoses:  Final diagnoses:  Adjustment disorder with depressed mood  Polysubstance abuse ([place])    Subjective: "I feel good."   Stay Summary: The patient was seen and examined face to face. He is alert, oriented and cooperative during the assessment. He vaguely reports passive suicidal ideations "a little bit" with a plan to jump off a bridge. He denies suicidal intent. He verbally contracts for safety. He vaguely reports homicidal ideations with no plan or intent and denies HI towards anyone specific. He reports AVH, "hearing Martians and seeing space ships, animals and letters flying." He does not appear to be responding to internal or external stimuli. He expressed feeling safe to discharge today and plans to go to the [facility]), and then find a place to stay tonight when the [facility] closes.  Collateral and Safety Plan Discussed with Mr. Sallee Provencal (Case Coordinator):  Mr. Sallee Provencal states that the patient has an long history of mental illness and is chronically suicidal. He states that the patient is safe to discharge today and may be transported to the [facility] via [facility]. He states that the patient does not need prescriptions for his psychiatric medications because he dropped the patient off a month's supply of medications last week to his friend house. He reports that the patient is homeless and goes to the [facility] during the day and sleeps in  abandoned buildings at night. He states that the patient checks in with him two to three times per week. He states that he takes the patient to his appointments and that the patient has an upcoming appointment here at the [facility] outpatient on 01/24/20 for an assessment and medication management. He states that he's unable to pick the patient up today and gives consent for the patient to be transported by [facility].   The  patient's labs, vital signs and home medications were all reviewed. The patient appears to be at baseline and does not meet criteria for inpatient treatment and is psychiatrically cleared.     Total Time spent with patient: 30 minutes  Past Psychiatric History: Bipolar disorder and Schizophrenia  (per chart review) Past Medical History:  Past Medical History:  Diagnosis Date  . Alcohol abuse   . Alcoholism ([place])   . Anxiety   . Asthma   . Asthma   . Bipolar disorder ([place])   . Chronic low back pain   . Cocaine abuse ([place])   . Depression   . Gout   . Gout   . HIV (human immunodeficiency virus infection) ([place])    "dx'd ~ 2 yr ago" (09/29/2012)  . HIV (human immunodeficiency virus infection) ([place])   . HIV disease ([place])   . Homelessness   . Hypertension   . Marijuana abuse   . Mental disorder   . Schizophrenia [facility])     Past Surgical History:  Procedure Laterality Date  . SKIN GRAFT FULL THICKNESS LEG Left ?   POSTERIOR LEFT LEG  AFTER DOG BITES   Family History:  Family History  Problem Relation Age of Onset  . Alcoholism Mother   . Depression Mother   . Alcoholism Brother    Family Psychiatric History: Unknown Social History:  Social History   Substance and Sexual Activity  Alcohol Use Yes  . Alcohol/week: 14.0 standard drinks  . Types: 14 Cans of beer per week  Comment: 1 24oz daily     Social History   Substance and Sexual Activity  Drug Use Not Currently  . Types: Cocaine, Marijuana    Social History   Socioeconomic History  . Marital status: Single    Spouse name: Not on file  . Number of children: Not on file  . Years of education: Not on file  . Highest education level: Not on file  Occupational History  . Not on file  Tobacco Use  . Smoking status: Current Every Day Smoker    Packs/day: 0.50    Years: 27.00    Pack years: 13.50    Types: Cigarettes  . Smokeless tobacco: Never Used  . Tobacco comment: cutting back  Vaping Use  . Vaping  Use: Never used  Substance and Sexual Activity  . Alcohol use: Yes    Alcohol/week: 14.0 standard drinks    Types: 14 Cans of beer per week    Comment: 1 24oz daily  . Drug use: Not Currently    Types: Cocaine, Marijuana  . Sexual activity: Not Currently    Partners: Male    Birth control/protection: Condom    Comment: pt. declined condoms  Other Topics Concern  . Not on file  Social History Narrative   ** Merged History Encounter **       ** Merged History Encounter **       Social Determinants of Health   Financial Resource Strain:   . Difficulty of Paying Living Expenses: Not on file  Food Insecurity:   . Worried About Charity fundraiser in the Last Year: Not on file  . Ran Out of Food in the Last Year: Not on file  Transportation Needs:   . Lack of Transportation (Medical): Not on file  . Lack of Transportation (Non-Medical): Not on file  Physical Activity:   . Days of Exercise per Week: Not on file  . Minutes of Exercise per Session: Not on file  Stress:   . Feeling of Stress : Not on file  Social Connections:   . Frequency of Communication with Friends and Family: Not on file  . Frequency of Social Gatherings with Friends and Family: Not on file  . Attends Religious Services: Not on file  . Active Member of Clubs or Organizations: Not on file  . Attends Archivist Meetings: Not on file  . Marital Status: Not on file   SDOH:  SDOH Screenings   Alcohol Screen: Medium Risk  . Last Alcohol Screening Score (AUDIT): 28  Depression (PHQ2-9): Medium Risk  . PHQ-2 Score: 26  Financial Resource Strain:   . Difficulty of Paying Living Expenses: Not on file  Food Insecurity:   . Worried About Charity fundraiser in the Last Year: Not on file  . Ran Out of Food in the Last Year: Not on file  Housing:   . Last Housing Risk Score: Not on file  Physical Activity:   . Days of Exercise per Week: Not on file  . Minutes of Exercise per Session: Not on file   Social Connections:   . Frequency of Communication with Friends and Family: Not on file  . Frequency of Social Gatherings with Friends and Family: Not on file  . Attends Religious Services: Not on file  . Active Member of Clubs or Organizations: Not on file  . Attends Archivist Meetings: Not on file  . Marital Status: Not on file  Stress:   .  Feeling of Stress : Not on file  Tobacco Use: High Risk  . Smoking Tobacco Use: Current Every Day Smoker  . Smokeless Tobacco Use: Never Used  Transportation Needs:   . Film/video editor (Medical): Not on file  . Lack of Transportation (Non-Medical): Not on file    Has this patient used any form of tobacco in the last 30 days? (Cigarettes, Smokeless Tobacco, Cigars, and/or Pipes) A prescription for an FDA-approved tobacco cessation medication was offered at discharge and the patient refused  Current Medications:  Current Facility-Administered Medications  Medication Dose Route Frequency Provider Last Rate Last Admin  . acetaminophen (TYLENOL) tablet 650 mg  650 mg Oral Q6H PRN Leevy-Johnson, Brooke A, NP      . alum & mag hydroxide-simeth (MAALOX/MYLANTA) 200-200-20 MG/5ML suspension 30 mL  30 mL Oral Q4H PRN Leevy-Johnson, Brooke A, NP      . busPIRone (BUSPAR) tablet 15 mg  15 mg Oral BID Leevy-Johnson, Brooke A, NP   15 mg at 12/28/19 0929  . Darunavir-Cobicisctat-Emtricitabine-Tenofovir Alafenamide (SYMTUZA) 800-150-200-10 MG TABS 1 tablet  1 tablet Oral Q breakfast Leevy-Johnson, Brooke A, NP      . magnesium hydroxide (MILK OF MAGNESIA) suspension 30 mL  30 mL Oral Daily PRN Leevy-Johnson, Brooke A, NP      . mirtazapine (REMERON SOL-TAB) disintegrating tablet 30 mg  30 mg Oral QHS Leevy-Johnson, Brooke A, NP   30 mg at 12/27/19 2116  . QUEtiapine (SEROQUEL) tablet 100 mg  100 mg Oral QHS Leevy-Johnson, Brooke A, NP   100 mg at 12/27/19 2116  . QUEtiapine (SEROQUEL) tablet 50 mg  50 mg Oral TID Leevy-Johnson, Brooke A, NP    50 mg at 12/28/19 0929  . traZODone (DESYREL) tablet 300 mg  300 mg Oral QHS Leevy-Johnson, Brooke A, NP   300 mg at 12/27/19 2116   Current Outpatient Medications  Medication Sig Dispense Refill  . albuterol (VENTOLIN HFA) 108 (90 Base) MCG/ACT inhaler Inhale 2 puffs into the lungs every 4 (four) hours as needed for wheezing or shortness of breath. 18 g 5  . amoxicillin (AMOXIL) 500 MG capsule Take 1 capsule (500 mg total) by mouth 3 (three) times daily. (Patient not taking: Reported on 12/26/2019) 30 capsule 0  . beclomethasone (QVAR) 80 MCG/ACT inhaler Inhale 1 puff into the lungs in the morning and at bedtime. 1 Inhaler 12  . busPIRone (BUSPAR) 15 MG tablet Take 15 mg by mouth 2 (two) times daily.    . Darunavir-Cobicisctat-Emtricitabine-Tenofovir Alafenamide (SYMTUZA) 800-150-200-10 MG TABS Take 1 tablet by mouth daily with breakfast. 30 tablet 5  . diclofenac Sodium (VOLTAREN) 1 % GEL Apply 2 g topically 4 (four) times daily as needed. (Patient not taking: Reported on 12/26/2019) 100 g 0  . dronabinol (MARINOL) 5 MG capsule Take 1 capsule (5 mg total) by mouth 2 (two) times daily before lunch and supper. 60 capsule 0  . mirtazapine (REMERON) 30 MG tablet Take 30 mg by mouth at bedtime.    Marland Kitchen oxyCODONE-acetaminophen (PERCOCET/ROXICET) 5-325 MG tablet Take 1 tablet by mouth every 6 (six) hours as needed for severe pain. 10 tablet 0  . QUEtiapine (SEROQUEL) 100 MG tablet Take 100 mg by mouth at bedtime.     Marland Kitchen QUEtiapine (SEROQUEL) 50 MG tablet Take 1 tablet (50 mg total) by mouth 3 (three) times daily. (Patient taking differently: Take 50 mg by mouth 2 (two) times daily. ) 90 tablet 1  . senna-docusate (SENOKOT-S) 8.6-50 MG tablet Take 1  tablet by mouth at bedtime as needed for mild constipation or moderate constipation. 30 tablet 0  . SODIUM FLUORIDE 5000 PLUS 1.1 % CREA dental cream Place 1 application onto teeth every other day.     . trazodone (DESYREL) 300 MG tablet Take 1 tablet (300 mg  total) by mouth at bedtime as needed for sleep. 30 tablet 1  . triazolam (HALCION) 0.25 MG tablet Take 0.25 mg by mouth at bedtime as needed for sleep.       PTA Medications: (Not in a hospital admission)   Musculoskeletal  Strength & Muscle Tone: within normal limits Gait & Station: normal Patient leans: N/A  Psychiatric Specialty Exam  Presentation  General Appearance: Casual;Disheveled  Eye Contact:Fair  Speech:Clear and Coherent  Speech Volume:Normal  Handedness:Right   Mood and Affect  Mood:Depressed  Affect:Appropriate   Thought Process  Thought Processes:Coherent  Descriptions of Associations:Intact  Orientation:Full (Time, Place and Person)  Thought Content:Logical  Hallucinations:Hallucinations: Auditory;Visual Description of Auditory Hallucinations: "I hear martians." Description of Visual Hallucinations: "I see space ships, animals and letters flying."  Ideas of Reference:None  Suicidal Thoughts:Suicidal Thoughts: Yes, Passive SI Passive Intent and/or Plan: Without Intent;Without Plan;Without Means to Carry Out;Without Access to Means  Homicidal Thoughts:Homicidal Thoughts: Yes, Passive HI Passive Intent and/or Plan: Without Plan;Without Means to Carry Out   Sensorium  Memory:Immediate Fair;Recent Margaretha Glassing  Judgment:Fair  Insight:Fair   Executive Functions  Concentration:Fair  Attention Span:Fair  [place]   Psychomotor Activity  Psychomotor Activity:Psychomotor Activity: Normal   Assets  Assets:Communication Skills;Desire for Improvement;Social Support   Sleep  Sleep:Sleep: Fair   Physical Exam  Physical Exam Vitals and nursing note reviewed.  Constitutional:      Appearance: He is well-developed.  HENT:     Head: Normocephalic.  Eyes:     Extraocular Movements: Extraocular movements intact.     Pupils: Pupils are equal, round, and reactive to light.   Cardiovascular:     Rate and Rhythm: Normal rate.  Pulmonary:     Effort: Pulmonary effort is normal.  Musculoskeletal:        General: Normal range of motion.  Neurological:     Mental Status: He is alert and oriented to person, place, and time.    Review of Systems  Constitutional: Negative.   HENT: Negative.   Eyes: Negative.   Respiratory: Negative.   Cardiovascular: Negative.   Gastrointestinal: Negative.   Genitourinary: Negative.   Musculoskeletal: Negative.   Skin: Negative.   Neurological: Negative.   Endo/Heme/Allergies: Negative.   Psychiatric/Behavioral: Positive for depression and suicidal ideas (Passive, chronic, no intent).   Blood pressure 130/83, pulse (!) 103, temperature 97.9 F (36.6 C), temperature source Oral, resp. rate 18, SpO2 98 %. There is no height or weight on file to calculate BMI.  Demographic Factors:  Male and Low socioeconomic status  Loss Factors: Financial problems/change in socioeconomic status  Historical Factors: Impulsivity  Risk Reduction Factors:   Positive social support and Positive coping skills or problem solving skills  Continued Clinical Symptoms:  Previous Psychiatric Diagnoses and Treatments  Cognitive Features That Contribute To Risk:  None    Suicide Risk:  Minimal: No identifiable suicidal ideation.  Patients presenting with no risk factors but with morbid ruminations; may be classified as minimal risk based on the severity of the depressive symptoms  Plan Of Care/Follow-up recommendations:  Continue activity as tolerated. Continue diet as recommended by your PCP. Ensure to keep all appointments  with outpatient providers.  Disposition: Patient will be discharged to home (homeless). He requested to be transported to the [facility]) and was transported via [facility].   [place], FNP 12/28/2019, 10:08 AM

## 2019-12-28 NOTE — ED Notes (Signed)
Pt got up and got a snack and went back to sleep. He is sleeping@ this time. Breathing even and unlabored. Will continue to monitor pt

## 2019-12-28 NOTE — ED Notes (Signed)
Writer called safe transport to escort pt to [facility]

## 2019-12-28 NOTE — ED Notes (Signed)
Pt sleeping @ this time. Breathing even and unlabored. Will continue to monitor pt

## 2019-12-28 NOTE — ED Notes (Signed)
Patient A&O x 4, ambulatory. Patient discharged in no acute distress. Patient denied SI/HI, A/VH upon discharge. Patient verbalized understanding of all discharge instructions explained by staff, to include follow up appointments and safety plan. Pt belongings returned to patient from locker #5 intact. Patient escorted to lobby via staff for transport to destination. Safety maintained.

## 2019-12-28 NOTE — ED Notes (Addendum)
Pt sleeping in no acute distress. Safety maintained.

## 2019-12-29 IMAGING — CR RIGHT WRIST - COMPLETE 3+ VIEW
4 series · 4 of 4 positions shown · non-contrast
Comparison: None.

CLINICAL DATA: Right wrist pain.  Gout.

EXAM:
RIGHT WRIST - COMPLETE 3+ VIEW

[wrist pa]
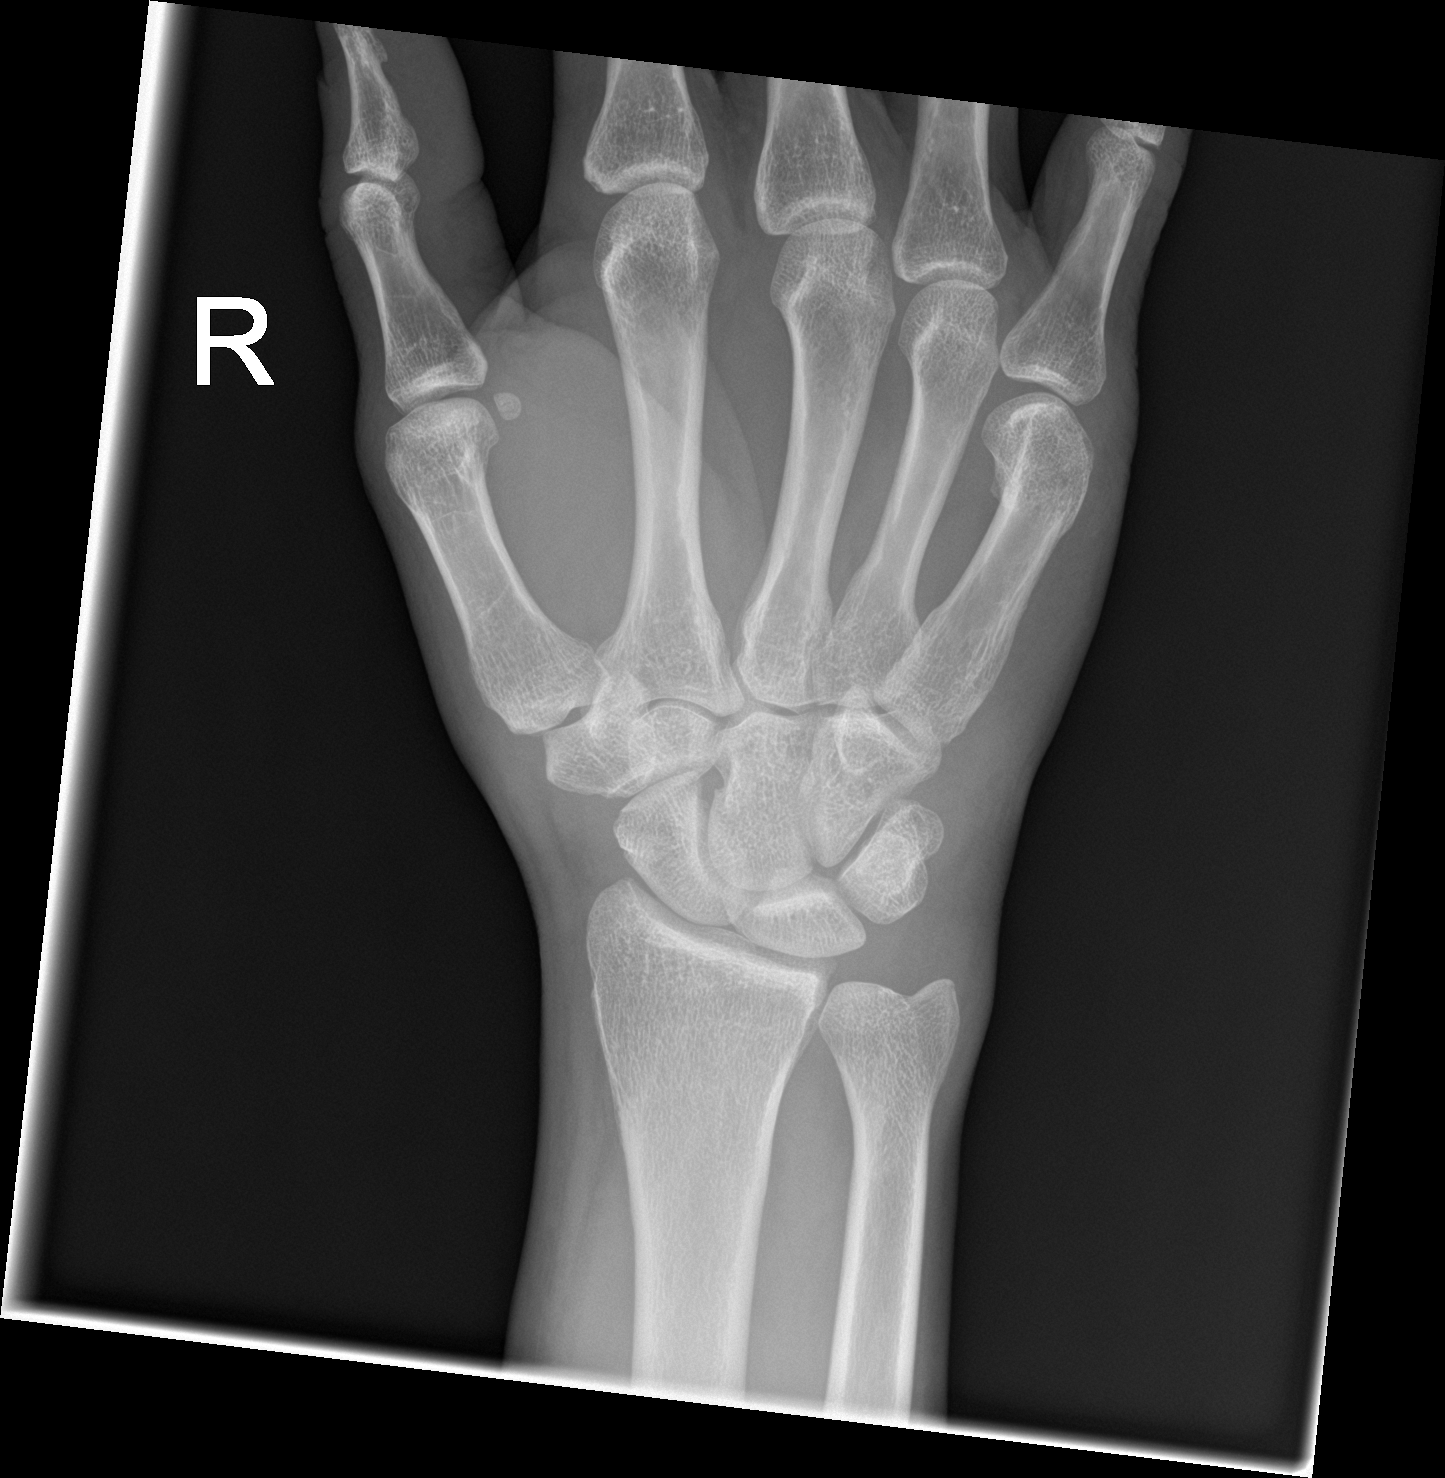

[wrist obl]
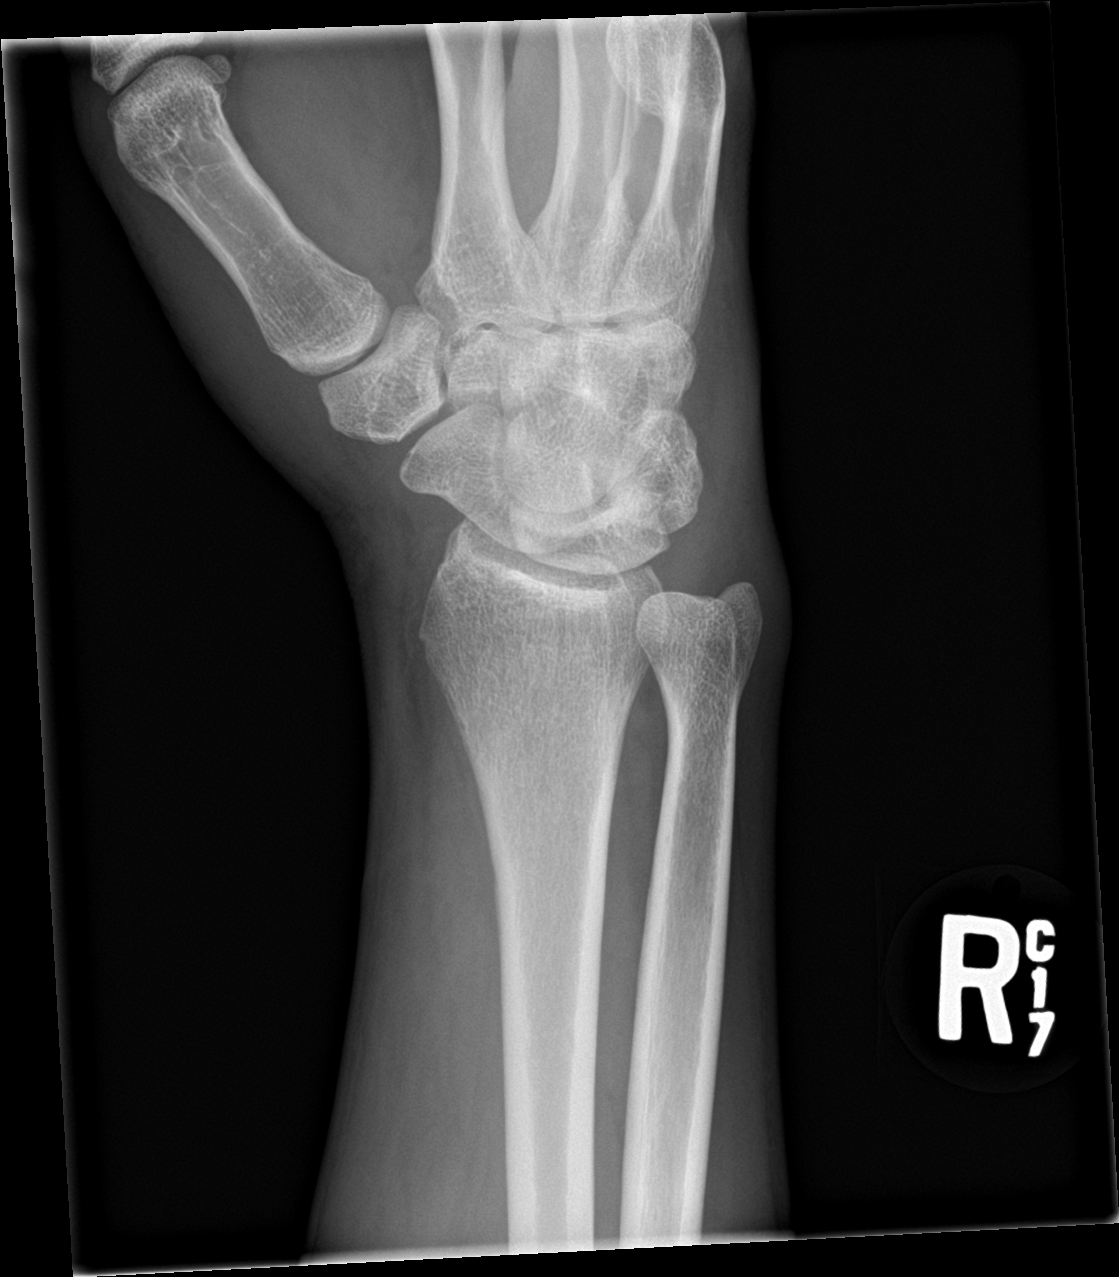

[wrist lat]
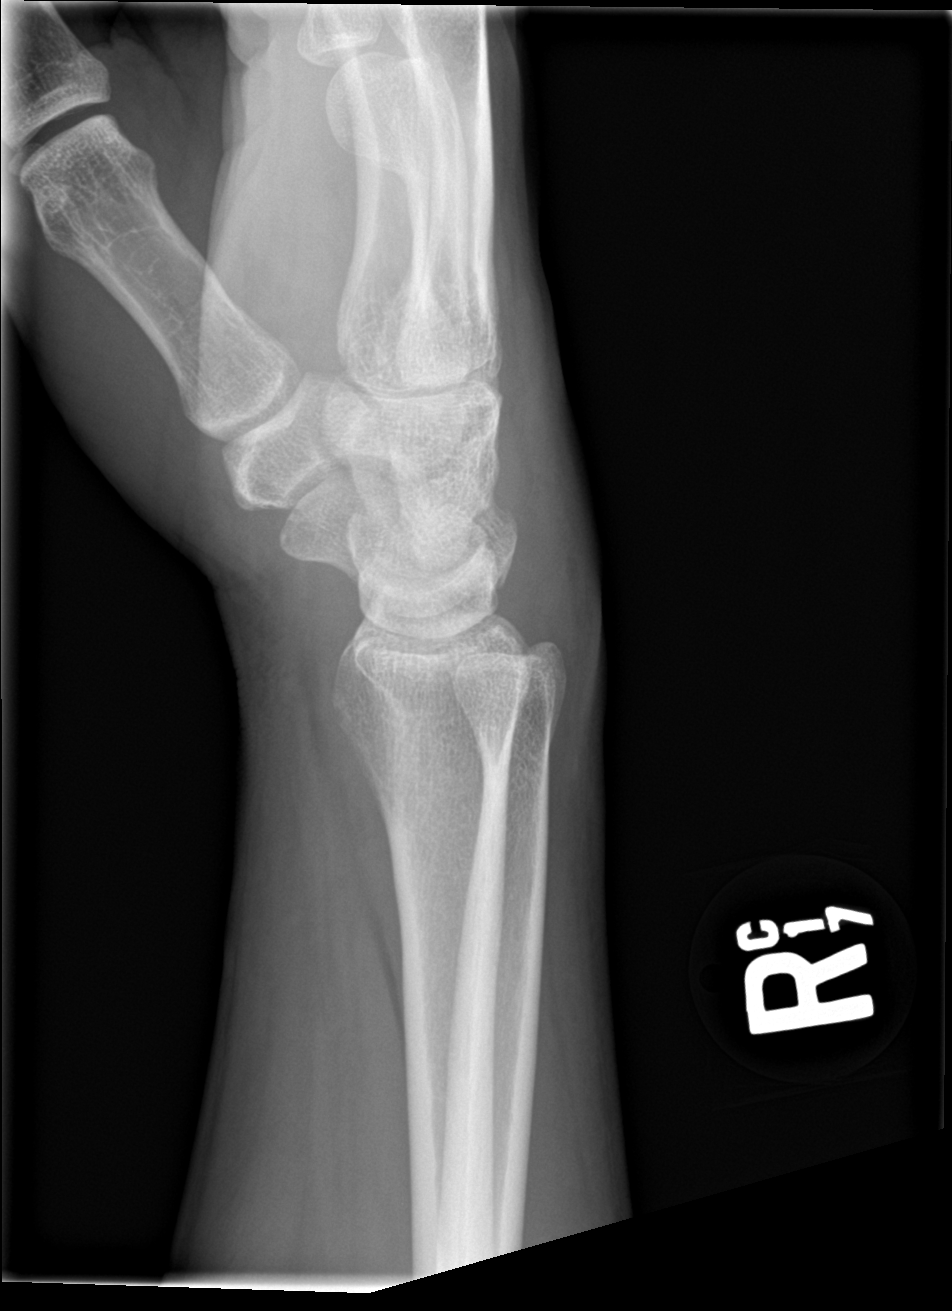

[wrist navicular]
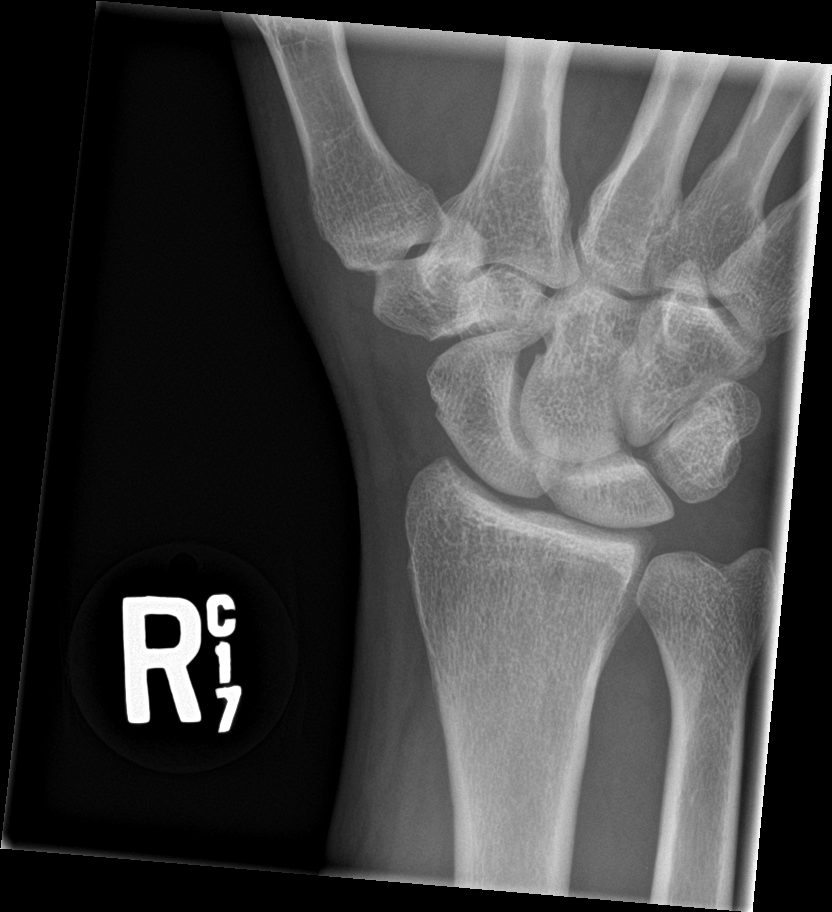

[4 of 4 positions shown; findings below may reference images not displayed]

FINDINGS: There is no evidence of fracture or dislocation. There is no
evidence of arthropathy or other focal bone abnormality. Soft
tissues are unremarkable.
IMPRESSION: Negative.

## 2019-12-31 ENCOUNTER — Telehealth: Payer: Self-pay

## 2019-12-31 NOTE — Telephone Encounter (Signed)
.  [facility] Patient Advocate Encounter  Patient's medications have been mailed to [facility] from [place].   Ileene Patrick , [facility] Patient [facility] for Infectious Disease Phone: [phone] Fax:  [phone]

## 2020-01-03 ENCOUNTER — Encounter: Payer: Self-pay | Admitting: Internal Medicine

## 2020-01-03 ENCOUNTER — Telehealth: Payer: Self-pay

## 2020-01-03 ENCOUNTER — Other Ambulatory Visit: Payer: Self-pay

## 2020-01-03 ENCOUNTER — Ambulatory Visit (INDEPENDENT_AMBULATORY_CARE_PROVIDER_SITE_OTHER): Payer: Self-pay | Admitting: Internal Medicine

## 2020-01-03 VITALS — BP 110/74 | HR 108 | Temp 98.4°F | Wt 149.0 lb

## 2020-01-03 DIAGNOSIS — B20 Human immunodeficiency virus [HIV] disease: Secondary | ICD-10-CM

## 2020-01-03 DIAGNOSIS — Z23 Encounter for immunization: Secondary | ICD-10-CM

## 2020-01-03 DIAGNOSIS — F319 Bipolar disorder, unspecified: Secondary | ICD-10-CM

## 2020-01-03 NOTE — Telephone Encounter (Signed)
[facility] Patient Advocate Encounter  Patient's Qvar medication have been Shipped to [facility] from [place].    Ileene Patrick , [facility] Patient [facility] for Infectious Disease Phone: [phone] Fax:  [phone]
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
## 2020-01-04 LAB — T-HELPER CELL (CD4) - (RCID CLINIC ONLY)
CD4 % Helper T Cell: 42 % (ref 33–65)
CD4 T Cell Abs: 1112 /uL (ref 400–1790)

## 2020-01-05 LAB — HIV-1 RNA QUANT-NO REFLEX-BLD
HIV 1 RNA Quant: <20 Copies/mL — ABNORMAL HIGH
HIV-1 RNA Quant, Log: <1.3 Log cps/mL — ABNORMAL HIGH

## 2020-01-15 ENCOUNTER — Other Ambulatory Visit: Payer: Self-pay | Admitting: Internal Medicine

## 2020-01-18 ENCOUNTER — Ambulatory Visit (HOSPITAL_COMMUNITY): Payer: Federal, State, Local not specified - Other | Admitting: Licensed Clinical Social Worker

## 2020-01-24 ENCOUNTER — Ambulatory Visit (HOSPITAL_COMMUNITY): Payer: Federal, State, Local not specified - Other | Admitting: Psychiatry

## 2020-01-24 ENCOUNTER — Ambulatory Visit (HOSPITAL_COMMUNITY): Payer: Federal, State, Local not specified - Other | Admitting: Licensed Clinical Social Worker

## 2020-02-18 ENCOUNTER — Encounter: Payer: Self-pay | Admitting: Family

## 2020-02-18 ENCOUNTER — Other Ambulatory Visit: Payer: Self-pay

## 2020-02-18 ENCOUNTER — Ambulatory Visit (INDEPENDENT_AMBULATORY_CARE_PROVIDER_SITE_OTHER): Payer: Self-pay | Admitting: Family

## 2020-02-18 VITALS — BP 124/80 | HR 98 | Temp 98.2°F | Wt 147.2 lb

## 2020-02-18 DIAGNOSIS — F4310 Post-traumatic stress disorder, unspecified: Secondary | ICD-10-CM

## 2020-02-18 DIAGNOSIS — Z113 Encounter for screening for infections with a predominantly sexual mode of transmission: Secondary | ICD-10-CM

## 2020-02-18 DIAGNOSIS — F1020 Alcohol dependence, uncomplicated: Secondary | ICD-10-CM

## 2020-02-18 DIAGNOSIS — F3164 Bipolar disorder, current episode mixed, severe, with psychotic features: Secondary | ICD-10-CM

## 2020-02-18 DIAGNOSIS — B20 Human immunodeficiency virus [HIV] disease: Secondary | ICD-10-CM

## 2020-02-18 DIAGNOSIS — Z0000 Encounter for general adult medical examination without abnormal findings: Secondary | ICD-10-CM

## 2020-02-18 MED ORDER — BUSPIRONE HCL 15 MG PO TABS: 15.0000 mg | ORAL_TABLET | Freq: Two times a day (BID) | ORAL | 0 refills | Status: DC

## 2020-02-18 MED ORDER — MIRTAZAPINE 30 MG PO TABS: 30.0000 mg | ORAL_TABLET | Freq: Every day | ORAL | 0 refills | Status: DC

## 2020-02-18 MED ORDER — QUETIAPINE FUMARATE 100 MG PO TABS: 100.0000 mg | ORAL_TABLET | Freq: Two times a day (BID) | ORAL | 0 refills | Status: DC

## 2020-02-18 MED ORDER — SYMTUZA 800-150-200-10 MG PO TABS: 1.0000 | ORAL_TABLET | Freq: Every day | ORAL | 2 refills | Status: DC

## 2020-02-18 NOTE — Patient Instructions (Signed)
Nice to see you.  Continue to take your Symtuza daily as prescribed.  Refills have been sent to the pharmacy.  Continue to take your Seroquel (quetiapine), trazodone, and Remeron (mirtazapine) as prescribed.  We will check your blood work today.  Follow-up with psychiatry as scheduled.  Plan for follow-up in 6 weeks or sooner if needed with Marya Amsler or Dr. Baxter Flattery.  Have a great day and stay safe!
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
## 2020-02-18 NOTE — Assessment & Plan Note (Signed)
John [name] continues to have well-controlled HIV disease with good adherence and tolerance to his ART regimen of Symtuza.  No signs/symptoms of opportunistic infection or progressive HIV disease.  We reviewed previous lab work and discussed the plan of care.  Continue current dose of Symtuza.  Check blood work today.  Plan for follow-up in 6 weeks or sooner if needed with lab work on the same day.

## 2020-02-18 NOTE — Assessment & Plan Note (Signed)
John [name] continues to drink 20 to 40 ounces of alcohol per day on average per his report although likely significantly higher.  Working with Engineer, materials to possibly enter rehabilitation and looking for beginning of the year.  Continue to monitor.

## 2020-02-18 NOTE — Assessment & Plan Note (Signed)
John [name] is awaiting follow-up with behavioral health appointment.  He has several bottles of each mirtazapine, trazodone, and Seroquel.  We reviewed in detail these medications and labeled the bottles with brand names to help with his clarification.  A refill of each will be sent to the pharmacy pending his behavioral health appointment.

## 2020-02-19 LAB — T-HELPER CELL (CD4) - (RCID CLINIC ONLY)
CD4 % Helper T Cell: 42 % (ref 33–65)
CD4 T Cell Abs: 521 /uL (ref 400–1790)

## 2020-02-20 ENCOUNTER — Other Ambulatory Visit: Payer: Self-pay | Admitting: Internal Medicine

## 2020-02-20 DIAGNOSIS — B20 Human immunodeficiency virus [HIV] disease: Secondary | ICD-10-CM

## 2020-02-20 LAB — COMPLETE METABOLIC PANEL WITH GFR
AG Ratio: 1.7 (calc) (ref 1.0–2.5)
ALT: 13 U/L (ref 9–46)
AST: 11 U/L (ref 10–40)
Albumin: 4 g/dL (ref 3.6–5.1)
Alkaline phosphatase (APISO): 64 U/L (ref 36–130)
BUN: 10 mg/dL (ref 7–25)
CO2: 24 mmol/L (ref 20–32)
Calcium: 8.9 mg/dL (ref 8.6–10.3)
Chloride: 107 mmol/L (ref 98–110)
Creat: 0.83 mg/dL (ref 0.60–1.35)
GFR, Est African American: 121 mL/min/{1.73_m2} (ref >=60)
GFR, Est Non African American: 104 mL/min/{1.73_m2} (ref >=60)
Globulin: 2.4 g/dL (calc) (ref 1.9–3.7)
Glucose, Bld: 128 mg/dL — ABNORMAL HIGH (ref 65–99)
Potassium: 4 mmol/L (ref 3.5–5.3)
Sodium: 141 mmol/L (ref 135–146)
Total Bilirubin: 0.5 mg/dL (ref 0.2–1.2)
Total Protein: 6.4 g/dL (ref 6.1–8.1)

## 2020-02-20 LAB — RPR: RPR Ser Ql: NONREACTIVE

## 2020-02-20 LAB — HIV-1 RNA QUANT-NO REFLEX-BLD
HIV 1 RNA Quant: <20 Copies/mL — ABNORMAL HIGH
HIV-1 RNA Quant, Log: <1.3 Log cps/mL — ABNORMAL HIGH

## 2020-02-21 ENCOUNTER — Telehealth: Payer: Self-pay

## 2020-02-21 NOTE — Telephone Encounter (Signed)
Spoke to [employer] from [facility] to relay results per Terri Piedra, NP. Relayed that viral load is undetectable, CD4 count 521, kidney and liver function and electrolytes look good, and RPR was negative for syphilis. Mitch states patient does not have working cell number right now and that he will relay message to patient.   Beryle Flock, RN

## 2020-02-21 NOTE — Telephone Encounter (Signed)
-----   Message from [place], [place] sent at 02/21/2020  9:01 AM EST ----- Please inform John [name] that his viral load is undetectable and CD4 count is 521. Kidney function, liver function and electrolytes look good and RPR was negative for syphilis.

## 2020-02-22 ENCOUNTER — Other Ambulatory Visit: Payer: Self-pay

## 2020-02-22 ENCOUNTER — Ambulatory Visit (INDEPENDENT_AMBULATORY_CARE_PROVIDER_SITE_OTHER): Payer: Self-pay

## 2020-02-22 DIAGNOSIS — Z23 Encounter for immunization: Secondary | ICD-10-CM

## 2020-02-22 NOTE — Progress Notes (Signed)
   Covid-19 Vaccination Clinic  Name:  John [name]    MRN: [id number] DOB: 07-13-1970  02/22/2020  John [name] was observed post Covid-19 immunization for 15 minutes without incident. He was provided with Vaccine Information Sheet and instruction to access the V-Safe system.   John [name] was instructed to call 911 with any severe reactions post vaccine: Marland Kitchen Difficulty breathing  . Swelling of face and throat  . A fast heartbeat  . A bad rash all over body  . Dizziness and weakness   Immunizations Administered    Name Date Dose VIS Date Route   Pfizer COVID-19 Vaccine 02/22/2020  9:24 AM 0.3 mL 01/02/2020 Intramuscular   Manufacturer: [place]   Lot: [id number]   NDC: [place], RN

## 2020-02-26 ENCOUNTER — Other Ambulatory Visit: Payer: Self-pay | Admitting: Family

## 2020-02-26 MED ORDER — TRAZODONE HCL 300 MG PO TABS: 300.0000 mg | ORAL_TABLET | Freq: Every evening | ORAL | 1 refills | Status: DC | PRN

## 2020-03-10 ENCOUNTER — Other Ambulatory Visit: Payer: Self-pay | Admitting: Family

## 2020-03-12 ENCOUNTER — Encounter (HOSPITAL_COMMUNITY): Payer: Self-pay

## 2020-03-12 ENCOUNTER — Emergency Department (HOSPITAL_COMMUNITY)
Admission: EM | Admit: 2020-03-12 | Discharge: 2020-03-13 | Disposition: A | Discharge status: Other Acute Inpatient Hospital | Payer: Self-pay | Attending: Emergency Medicine | Admitting: Emergency Medicine

## 2020-03-12 ENCOUNTER — Other Ambulatory Visit: Payer: Self-pay

## 2020-03-12 ENCOUNTER — Ambulatory Visit (HOSPITAL_COMMUNITY)
Admission: EM | Admit: 2020-03-12 | Discharge: 2020-03-12 | Disposition: A | Discharge status: Home / Self Care | Payer: No Payment, Other | Attending: Psychiatry | Admitting: Psychiatry

## 2020-03-12 DIAGNOSIS — F1914 Other psychoactive substance abuse with psychoactive substance-induced mood disorder: Secondary | ICD-10-CM | POA: Diagnosis not present

## 2020-03-12 DIAGNOSIS — F1910 Other psychoactive substance abuse, uncomplicated: Secondary | ICD-10-CM | POA: Insufficient documentation

## 2020-03-12 DIAGNOSIS — F17210 Nicotine dependence, cigarettes, uncomplicated: Secondary | ICD-10-CM | POA: Insufficient documentation

## 2020-03-12 DIAGNOSIS — R45851 Suicidal ideations: Secondary | ICD-10-CM | POA: Insufficient documentation

## 2020-03-12 DIAGNOSIS — F332 Major depressive disorder, recurrent severe without psychotic features: Secondary | ICD-10-CM | POA: Insufficient documentation

## 2020-03-12 DIAGNOSIS — F1994 Other psychoactive substance use, unspecified with psychoactive substance-induced mood disorder: Secondary | ICD-10-CM

## 2020-03-12 DIAGNOSIS — R45850 Homicidal ideations: Secondary | ICD-10-CM | POA: Insufficient documentation

## 2020-03-12 DIAGNOSIS — Z20822 Contact with and (suspected) exposure to covid-19: Secondary | ICD-10-CM | POA: Insufficient documentation

## 2020-03-12 DIAGNOSIS — F1410 Cocaine abuse, uncomplicated: Secondary | ICD-10-CM | POA: Insufficient documentation

## 2020-03-12 DIAGNOSIS — J4540 Moderate persistent asthma, uncomplicated: Secondary | ICD-10-CM | POA: Insufficient documentation

## 2020-03-12 DIAGNOSIS — I10 Essential (primary) hypertension: Secondary | ICD-10-CM | POA: Insufficient documentation

## 2020-03-12 DIAGNOSIS — F209 Schizophrenia, unspecified: Secondary | ICD-10-CM | POA: Insufficient documentation

## 2020-03-12 DIAGNOSIS — Z79899 Other long term (current) drug therapy: Secondary | ICD-10-CM | POA: Insufficient documentation

## 2020-03-12 DIAGNOSIS — F1010 Alcohol abuse, uncomplicated: Secondary | ICD-10-CM | POA: Insufficient documentation

## 2020-03-12 DIAGNOSIS — Z21 Asymptomatic human immunodeficiency virus [HIV] infection status: Secondary | ICD-10-CM | POA: Insufficient documentation

## 2020-03-12 LAB — COMPREHENSIVE METABOLIC PANEL
ALT: 12 U/L (ref 0–44)
AST: 18 U/L (ref 15–41)
Albumin: 3.9 g/dL (ref 3.5–5.0)
Alkaline Phosphatase: 59 U/L (ref 38–126)
Anion gap: 9 (ref 5–15)
BUN: 17 mg/dL (ref 6–20)
CO2: 26 mmol/L (ref 22–32)
Calcium: 8.8 mg/dL — ABNORMAL LOW (ref 8.9–10.3)
Chloride: 104 mmol/L (ref 98–111)
Creatinine, Ser: 0.96 mg/dL (ref 0.61–1.24)
GFR, Estimated: >60 mL/min (ref >60)
Glucose, Bld: 77 mg/dL (ref 70–99)
Potassium: 3.6 mmol/L (ref 3.5–5.1)
Sodium: 139 mmol/L (ref 135–145)
Total Bilirubin: 0.6 mg/dL (ref 0.3–1.2)
Total Protein: 6.9 g/dL (ref 6.5–8.1)

## 2020-03-12 LAB — CBC WITH DIFFERENTIAL/PLATELET
Abs Immature Granulocytes: 0.02 10*3/uL (ref 0.00–0.07)
Basophils Absolute: 0 10*3/uL (ref 0.0–0.1)
Basophils Relative: 1 %
Eosinophils Absolute: 0.2 10*3/uL (ref 0.0–0.5)
Eosinophils Relative: 4 %
HCT: 38.6 % — ABNORMAL LOW (ref 39.0–52.0)
Hemoglobin: 12.5 g/dL — ABNORMAL LOW (ref 13.0–17.0)
Immature Granulocytes: 0 %
Lymphocytes Relative: 46 %
Lymphs Abs: 2.6 10*3/uL (ref 0.7–4.0)
MCH: 32.6 pg (ref 26.0–34.0)
MCHC: 32.4 g/dL (ref 30.0–36.0)
MCV: 100.8 fL — ABNORMAL HIGH (ref 80.0–100.0)
Monocytes Absolute: 0.7 10*3/uL (ref 0.1–1.0)
Monocytes Relative: 13 %
Neutro Abs: 2 10*3/uL (ref 1.7–7.7)
Neutrophils Relative %: 36 %
Platelets: 444 10*3/uL — ABNORMAL HIGH (ref 150–400)
RBC: 3.83 MIL/uL — ABNORMAL LOW (ref 4.22–5.81)
RDW: 13.1 % (ref 11.5–15.5)
WBC: 5.5 10*3/uL (ref 4.0–10.5)
nRBC: 0 % (ref 0.0–0.2)

## 2020-03-12 LAB — RESP PANEL BY RT-PCR (FLU A&B, COVID) ARPGX2
Influenza A by PCR: NEGATIVE
Influenza B by PCR: NEGATIVE
SARS Coronavirus 2 by RT PCR: NEGATIVE

## 2020-03-12 LAB — ETHANOL: Alcohol, Ethyl (B): <10 mg/dL (ref <10)

## 2020-03-12 MED ORDER — QUETIAPINE FUMARATE 100 MG PO TABS: 100.0000 mg | ORAL_TABLET | Freq: Two times a day (BID) | ORAL | Status: DC

## 2020-03-12 MED ORDER — BUSPIRONE HCL 15 MG PO TABS: 15.0000 mg | ORAL_TABLET | Freq: Two times a day (BID) | ORAL | 0 refills | Status: DC

## 2020-03-12 MED ORDER — MIRTAZAPINE 30 MG PO TABS: 30.0000 mg | ORAL_TABLET | Freq: Every day | ORAL | 0 refills | Status: DC

## 2020-03-12 MED ORDER — BUSPIRONE HCL 15 MG PO TABS: 15.0000 mg | ORAL_TABLET | Freq: Two times a day (BID) | ORAL | Status: DC

## 2020-03-12 MED ORDER — QUETIAPINE FUMARATE 100 MG PO TABS: 100.0000 mg | ORAL_TABLET | Freq: Two times a day (BID) | ORAL | 0 refills | Status: DC

## 2020-03-12 MED ORDER — TRAZODONE HCL 100 MG PO TABS: 100.0000 mg | ORAL_TABLET | Freq: Every day | ORAL | Status: DC

## 2020-03-12 MED ORDER — TRAZODONE HCL 100 MG PO TABS: 100.0000 mg | ORAL_TABLET | Freq: Every day | ORAL | 0 refills | Status: DC

## 2020-03-12 MED ORDER — DARUN-COBIC-EMTRICIT-TENOFAF 800-150-200-10 MG PO TABS
1.0000 | ORAL_TABLET | Freq: Every day | ORAL | Status: DC
  Administered 2020-03-13: 08:00:00 1 via ORAL
  Filled 2020-03-12: qty 1

## 2020-03-12 MED ORDER — MIRTAZAPINE 30 MG PO TABS: 30.0000 mg | ORAL_TABLET | Freq: Every day | ORAL | Status: DC

## 2020-03-12 NOTE — BH Assessment (Signed)
Melbourne Abts PA-C, recommends overnight observation at [facility].  Disposition discussed with Milagros Loll RN, via secure chat in Epic.

## 2020-03-12 NOTE — ED Provider Notes (Signed)
Behavioral Health Urgent Care Medical Screening Exam  Patient Name: John [name] MRN: [id number] Date of Evaluation: 03/12/20 Chief Complaint:   Diagnosis:  Final diagnoses:  Substance induced mood disorder ([place])  Cocaine abuse ([place])  Alcohol abuse    History of Present illness: John [name] is a 49 y.o. male.  Patient presents to the [facility] voluntarily with [employer].  Patient reports that he had contacted police and stated that he had been suicidal but when patient arrived she states that he needs to be started on his medications because he has not taken them for a while.  He states that he relapsed on drugs approximately 5 to 6 months ago and has been drinking excessive amounts of alcohol and using crack cocaine and marijuana.  Patient has a long history of substance abuse.  The patient reports that he has been drinking 8-9 drinks a day of alcohol, beer, hard lemonade.  He also reports daily use of cocaine and marijuana.  He states that the last time he was sober was approximately 12 months ago.  He request that he needs to be restarted on his Seroquel, BuSpar, trazodone, and Remeron.  Patient then states that he is not suicidal nor homicidal and states that he just wants to get into substance abuse treatment.  He continues state that he has a Product/process development scientist by the name of [name] and can be reached at [phone].  He requests that I contact him to notify that he is trying to get into substance abuse treatment.  I did place a phone call to [name] and that had to leave a HIPAA compliant voicemail.  I does not patient does not meet inpatient psychiatric treatment but do feel that substance abuse treatment such as detox or residential treatment would be very beneficial to this patient.  Based off his report detox would be priority.  Psychiatric Specialty Exam  Presentation  General Appearance:Appropriate for Environment; Casual  Eye Contact:Fair  Speech:Clear and Coherent; Normal  Rate  Speech Volume:Normal  Handedness:Right   Mood and Affect  Mood:Anxious  Affect:Appropriate; Congruent   Thought Process  Thought Processes:Coherent  Descriptions of Associations:Intact  Orientation:Full (Time, Place and Person)  Thought Content:WDL  Hallucinations:None  Ideas of Reference:None  Suicidal Thoughts:No Without Intent; Without Plan; Without Means to Carry Out; Without Access to Means  Homicidal Thoughts:No Without Plan; Without Means to Carry Out   Sensorium  Memory:Immediate Good; Recent Good; Remote Good  Judgment:Fair  Insight:Fair   Executive Functions  Concentration:Good  Attention Span:Good  [place]  Language:Good   Psychomotor Activity  Psychomotor Activity:Normal   Assets  Assets:Communication Skills; Desire for Improvement   Sleep  Sleep:Fair  Number of hours: No data recorded  Physical Exam: Physical Exam Vitals and nursing note reviewed.  Constitutional:      Appearance: He is well-developed.  HENT:     Head: Normocephalic.  Eyes:     Pupils: Pupils are equal, round, and reactive to light.  Cardiovascular:     Rate and Rhythm: Normal rate.  Pulmonary:     Effort: Pulmonary effort is normal.  Musculoskeletal:        General: Normal range of motion.  Neurological:     Mental Status: He is alert and oriented to person, place, and time.    Review of Systems  Constitutional: Negative.   HENT: Negative.   Eyes: Negative.   Respiratory: Negative.   Cardiovascular: Negative.   Gastrointestinal: Negative.   Genitourinary: Negative.  Musculoskeletal: Negative.   Skin: Negative.   Neurological: Negative.   Endo/Heme/Allergies: Negative.   Psychiatric/Behavioral: Positive for substance abuse.   Blood pressure (!) 115/91, pulse 91, temperature 98.5 F (36.9 C), temperature source Oral, resp. rate 18, SpO2 100 %. There is no height or weight on file to calculate  BMI.  Musculoskeletal: Strength & Muscle Tone: within normal limits Gait & Station: normal Patient leans: N/A   [facility] MSE Discharge Disposition for Follow up and Recommendations: Based on my evaluation the patient does not appear to have an emergency medical condition and can be discharged with resources and follow up care in outpatient services for Medication Management and Individual Therapy   Maryfrances Bunnell, FNP 03/12/2020, 12:53 PM

## 2020-03-12 NOTE — ED Triage Notes (Signed)
Received John [name] via [facility] with [employer]. His chief compliant is suicidal related to drinking and substance abuse. He used street drugs and crack cocaine yesterday. He has been non compliant with his medications for over a month. His phx consists of anxiety, depression, bipolar, schizophrenia,asthma and HIV. His case worker is  [name] at [phone].

## 2020-03-12 NOTE — Discharge Summary (Signed)
Tanna Furry to be D/C'd Home per NP order.  Discussed with the patient and all questions fully answered.   An After Visit Summary was printed and given to the patient. Patient received prescriptions.  D/c education completed with patient including follow up instructions, medication list, d/c activities limitations if indicated, with other d/c instructions as indicated by NP - patient able to verbalize understanding, all questions fully answered.   Patient instructed to return to ED, call 911, or call NP for any changes in condition.     Leamon Arnt 03/12/2020 3:41 PM

## 2020-03-12 NOTE — Progress Notes (Signed)
CSW left message with Shayla [phone]) in admissions at [facility] requesting a return call.   RTS was contacted and there are no detox beds available today. There is one bed available tomorrow but has been tentatively assigned.    Wells Guiles, LCSW, LCAS Clinical Social Worker II Disposition CSW [phone]
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
## 2020-03-12 NOTE — ED Triage Notes (Signed)
Pt BIB [employer] due to SI and HI. Pt is here voluntarily. Pt has hx of schizophrenia and depression.
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
## 2020-03-12 NOTE — Discharge Instructions (Addendum)

## 2020-03-13 ENCOUNTER — Other Ambulatory Visit: Payer: Self-pay

## 2020-03-13 ENCOUNTER — Ambulatory Visit (HOSPITAL_COMMUNITY)
Admission: EM | Admit: 2020-03-13 | Discharge: 2020-03-13 | Disposition: A | Discharge status: Home / Self Care | Payer: No Payment, Other | Attending: Psychiatry | Admitting: Psychiatry

## 2020-03-13 DIAGNOSIS — F1910 Other psychoactive substance abuse, uncomplicated: Secondary | ICD-10-CM

## 2020-03-13 DIAGNOSIS — F332 Major depressive disorder, recurrent severe without psychotic features: Secondary | ICD-10-CM

## 2020-03-13 DIAGNOSIS — J45909 Unspecified asthma, uncomplicated: Secondary | ICD-10-CM

## 2020-03-13 MED ORDER — MIRTAZAPINE 30 MG PO TABS
30.0000 mg | ORAL_TABLET | Freq: Every day | ORAL | Status: DC
  Filled 2020-03-13: qty 7

## 2020-03-13 MED ORDER — TRAZODONE HCL 100 MG PO TABS
100.0000 mg | ORAL_TABLET | Freq: Every day | ORAL | Status: DC
  Filled 2020-03-13: qty 7

## 2020-03-13 MED ORDER — QUETIAPINE FUMARATE 100 MG PO TABS
100.0000 mg | ORAL_TABLET | Freq: Two times a day (BID) | ORAL | Status: DC
  Administered 2020-03-13: 11:00:00 100 mg via ORAL
  Filled 2020-03-13: qty 1
  Filled 2020-03-13: qty 14

## 2020-03-13 MED ORDER — BUSPIRONE HCL 15 MG PO TABS: 15.0000 mg | ORAL_TABLET | Freq: Two times a day (BID) | ORAL | 0 refills | Status: DC

## 2020-03-13 MED ORDER — QUETIAPINE FUMARATE 100 MG PO TABS: 100.0000 mg | ORAL_TABLET | Freq: Two times a day (BID) | ORAL | 0 refills | Status: DC

## 2020-03-13 MED ORDER — ALBUTEROL SULFATE HFA 108 (90 BASE) MCG/ACT IN AERS: 2.0000 | INHALATION_SPRAY | RESPIRATORY_TRACT | Status: DC | PRN

## 2020-03-13 MED ORDER — BUSPIRONE HCL 15 MG PO TABS
15.0000 mg | ORAL_TABLET | Freq: Two times a day (BID) | ORAL | Status: DC
  Administered 2020-03-13: 11:00:00 15 mg via ORAL
  Filled 2020-03-13 (×2): qty 14
  Filled 2020-03-13: qty 1

## 2020-03-13 MED ORDER — TRAZODONE HCL 100 MG PO TABS: 100.0000 mg | ORAL_TABLET | Freq: Every day | ORAL | 0 refills | Status: DC

## 2020-03-13 MED ORDER — ALBUTEROL SULFATE HFA 108 (90 BASE) MCG/ACT IN AERS: 2.0000 | INHALATION_SPRAY | RESPIRATORY_TRACT | 0 refills | Status: DC | PRN

## 2020-03-13 MED ORDER — ALBUTEROL SULFATE HFA 108 (90 BASE) MCG/ACT IN AERS
2.0000 | INHALATION_SPRAY | RESPIRATORY_TRACT | Status: DC | PRN
  Administered 2020-03-13: 09:00:00 2 via RESPIRATORY_TRACT
  Filled 2020-03-13: qty 6.7

## 2020-03-13 MED ORDER — ALBUTEROL SULFATE HFA 108 (90 BASE) MCG/ACT IN AERS: 2.0000 | INHALATION_SPRAY | RESPIRATORY_TRACT | 1 refills | Status: DC | PRN

## 2020-03-13 MED ORDER — MIRTAZAPINE 30 MG PO TABS: 30.0000 mg | ORAL_TABLET | Freq: Every day | ORAL | 0 refills | Status: DC

## 2020-03-13 NOTE — ED Notes (Signed)
Dr Bernette Mayers presently filling out [place] paperwork for pt to go to [facility]

## 2020-03-13 NOTE — ED Provider Notes (Signed)
Emergency Medicine Observation Re-evaluation Note  John [name] is a 49 y.o. male, seen on rounds today.  Pt initially presented to the ED for complaints of Suicidal (/) and Homicidal Currently, the patient is awaiting transport to [facility]  Physical Exam  BP 140/77   Pulse 79   Temp 98.7 F (37.1 C)   Resp 17   SpO2 97%  Physical Exam General: Sleeping in no distress Psych: Calm  ED Course / MDM  EKG:    I have reviewed the labs performed to date as well as medications administered while in observation.  Recent changes in the last 24 hours include none.  Plan  Current plan is for [facility]. Patient is not under full IVC at this time.   Pollyann Savoy, MD 03/13/20 [phone]

## 2020-03-13 NOTE — ED Notes (Signed)
Report given to Marcelino Duster, Charity fundraiser at [facility] transport notified.

## 2020-03-13 NOTE — Discharge Instructions (Addendum)

## 2020-03-13 NOTE — ED Notes (Signed)
Patient A&O x 4, ambulatory. Patient discharged in no acute distress. Patient denied SI/HI, A/VH upon discharge. Patient verbalized understanding of all discharge instructions explained by staff, to include follow up appointments, RX's and safety plan. Pt belongings returned to patient from locker # 32 intact. Patient escorted to lobby via staff for transport to [facility] via safe transport. Safety maintained.

## 2020-03-13 NOTE — ED Provider Notes (Signed)
[facility]al Health Urgent Care Medical Screening Exam  Patient Name: John [name] MRN: [id number] Date of Evaluation: 03/13/20 Chief Complaint:   Diagnosis:  Final diagnoses:  Polysubstance abuse (HCC)  Severe episode of recurrent major depressive disorder, without psychotic features (HCC)     History of Present illness: John [name] is a 49 y.o. male.  Patient presented to the [facility] after being transported from the [facility] emergency department where he presented last night reporting having suicidal ideations.  Patient had presented to the [facility] yesterday requesting assistance with getting into substance abuse treatment.  Patient had denied multiple times that he was not suicidal nor homicidal.  Will assist the patient and try to get him into a substance abuse program however there were no available beds yesterday.  Patient was under review at [facility].  However, this morning the patient had a phone interview with [facility] and they have no availability today.  [facility] in [place] does have available male beds for detox and patient stated that he wanted to go there.  Patient again today denies having any suicidal or homicidal ideations and states that he feels safe if he can get to the substance abuse treatment program.  For the patient was endorsing suicidal ideations for secondary gain from the hospital.  Patient has reported use of alcohol as well as cocaine on a regular basis.  Also did note yesterday I contacted patient's caseworker, John [name], and he reported that the patient does have a chronic history of reporting suicidal ideations and order to get placed into a facility so that he is off the streets.  He also reported that he felt that the patient needed to go to substance abuse treatment and that was being the best option for him.  Patient will be provided with 30-day prescriptions of his medications and 7-day samples.  Patient will be transported to [facility] in [place] for  detox.  Psychiatric Specialty Exam  Presentation  General Appearance:Appropriate for Environment; Casual  Eye Contact:Good  Speech:Clear and Coherent  Speech Volume:Normal  Handedness:Right   Mood and Affect  Mood:Euthymic  Affect:Appropriate; Congruent   Thought Process  Thought Processes:Coherent  Descriptions of Associations:Intact  Orientation:Full (Time, Place and Person)  Thought Content:WDL  Hallucinations:None "I hear martians." "I see space ships, animals and letters flying."  Ideas of Reference:None  Suicidal Thoughts:No Without Intent; Without Plan; Without Means to Carry Out; Without Access to Means  Homicidal Thoughts:No Without Plan; Without Means to Carry Out   Sensorium  Memory:Immediate Good; Recent Good; Remote Good  Judgment:Fair  Insight:Fair   Executive Functions  Concentration:Good  Attention Span:Good  Recall:Good  Fund of Knowledge:Good  Language:Good   Psychomotor Activity  Psychomotor Activity:Normal   Assets  Assets:Communication Skills; Desire for Improvement; Social Support   Sleep  Sleep:Good  Number of hours: No data recorded  Physical Exam: Physical Exam Vitals and nursing note reviewed.  Constitutional:      Appearance: He is well-developed.  HENT:     Head: Normocephalic.  Eyes:     Pupils: Pupils are equal, round, and reactive to light.  Cardiovascular:     Rate and Rhythm: Normal rate.  Pulmonary:     Effort: Pulmonary effort is normal.  Musculoskeletal:        General: Normal range of motion.  Neurological:     Mental Status: He is alert and oriented to person, place, and time.    Review of Systems  Constitutional: Negative.   HENT: Negative.   Eyes:  Negative.   Respiratory: Negative.   Cardiovascular: Negative.   Gastrointestinal: Negative.   Genitourinary: Negative.   Musculoskeletal: Negative.   Skin: Negative.   Neurological: Negative.   Endo/Heme/Allergies: Negative.    Psychiatric/Behavioral: Positive for substance abuse.   There were no vitals taken for this visit. There is no height or weight on file to calculate BMI.  Musculoskeletal: Strength & Muscle Tone: within normal limits Gait & Station: normal Patient leans: N/A   [facility] MSE Discharge Disposition for Follow up and Recommendations: Based on my evaluation the patient does not appear to have an emergency medical condition and can be discharged with resources and follow up care in outpatient services for Medication Management, Individual Therapy and residential substance abuse treatment   Lewis Shock, FNP 03/13/2020, 10:51 AM

## 2020-03-13 NOTE — ED Notes (Signed)
49 y/o male admitted to continuous observation due to complaints of SI with plan to stab self contingent on getting substance abuse treatment for crack cocaine and ETOH use. Pt reports last used substance 2 days ago. Pt is easily agitated but able to be redirected. Safety maintained.

## 2020-03-13 NOTE — ED Notes (Signed)
[facility] called for transportation to [facility].

## 2020-03-13 NOTE — ED Notes (Signed)
Report given to Vernona Rieger at [facility]. AlsoDanny from [facility] called  [facility] is interviewing pt for acceptance, verbally states to send pt to [facility].
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
## 2020-03-13 NOTE — ED Notes (Signed)
Pt asked for urine specimen as he enters restroom. Pt flushed down the commode instead.

## 2020-03-17 ENCOUNTER — Telehealth (HOSPITAL_COMMUNITY): Payer: Self-pay | Admitting: *Deleted

## 2020-03-17 NOTE — Telephone Encounter (Signed)
Care Management - Follow Up [facility] Discharges   Writer attempted to make contact with patient today and was unsuccessful.  Writer was able to leave a HIPPA compliant voice message and will await callback.   

## 2020-03-19 ENCOUNTER — Encounter: Payer: Self-pay | Admitting: Pharmacist

## 2020-03-19 ENCOUNTER — Telehealth: Payer: Self-pay

## 2020-03-19 NOTE — Progress Notes (Signed)
Medication Samples have been provided to the patient.  Drug name: Symtuza Strength: 50/200/25 mg       Qty: 1 bottle; 30 days   LOT: [id number]   Exp.Date: 06/12/2021  Dosing instructions: Take one tablet by mouth once daily  The patient has been instructed regarding the correct time, dose, and frequency of taking this medication, including desired effects and most common side effects.   Breezy Hertenstein L. Jannette Fogo, PharmD, BCIDP, AAHIVP, CPP Clinical Pharmacist Practitioner Infectious Diseases Clinical Pharmacist [facility] 02/25/2020, 10:07 AM

## 2020-03-19 NOTE — Telephone Encounter (Signed)
[facility] Patient Advocate Encounter  Patient lost his Symtuza medication we gave him 1 sample bottle.   Clearance Coots, CPhT Specialty Pharmacy Patient [facility] for Infectious Disease Phone: [phone] Fax:  [phone]

## 2020-03-25 ENCOUNTER — Other Ambulatory Visit: Payer: Self-pay | Admitting: Family

## 2020-03-26 ENCOUNTER — Other Ambulatory Visit: Payer: Self-pay | Admitting: Family

## 2020-03-26 ENCOUNTER — Telehealth: Payer: Self-pay

## 2020-03-26 MED ORDER — QUETIAPINE FUMARATE 100 MG PO TABS: 100.0000 mg | ORAL_TABLET | Freq: Two times a day (BID) | ORAL | 2 refills | Status: DC

## 2020-03-26 MED ORDER — MIRTAZAPINE 30 MG PO TABS: 30.0000 mg | ORAL_TABLET | Freq: Every day | ORAL | 2 refills | Status: DC

## 2020-03-26 NOTE — Telephone Encounter (Signed)
[facility] Patient Advocate Encounter  Patient's medication (Qvar) have been delivered to [facility] from [place].   John [name] , [place] [facility] Patient [facility] for Infectious Disease Phone: [phone] Fax:  [phone]

## 2020-03-28 ENCOUNTER — Encounter: Payer: Self-pay | Admitting: *Deleted

## 2020-03-28 NOTE — Congregational Nurse Program (Signed)
  Dept: [place] Nurse Program Note  Date of Encounter: 03/28/2020  Past Medical History: Past Medical History:  Diagnosis Date  . Alcohol abuse   . Alcoholism ([place])   . Anxiety   . Asthma   . Asthma   . Bipolar disorder ([place])   . Chronic low back pain   . Cocaine abuse ([place])   . Depression   . Gout   . Gout   . HIV (human immunodeficiency virus infection) ([place])    "dx'd ~ 2 yr ago" (09/29/2012)  . HIV (human immunodeficiency virus infection) ([place])   . HIV disease ([place])   . Homelessness   . Hypertension   . Marijuana abuse   . Mental disorder   . Schizophrenia [facility])     Encounter Details:  CNP Questionnaire - 03/28/20 1152      Questionnaire   Do you give verbal consent to treat you today? Yes    Visit Setting Church or Organization    Location Patient Served At [facility]    Patient Status Not Applicable    Medical Provider Yes    Insurance Uninsured (Includes [employer] Card/Care [place])    Intervention Support    Housing/Utilities No permanent housing          Followed up with client since recent hospital visit. Client reports he has a CM [name] that helps with his medication. He goes to infectious disease clinic and [facility] for medication management and treatment. Client is established with Marliss Coots NP. No concerns at this time. Mysha Peeler W RN CN [phone]

## 2020-03-31 ENCOUNTER — Other Ambulatory Visit: Payer: Self-pay | Admitting: Family

## 2020-03-31 DIAGNOSIS — F3164 Bipolar disorder, current episode mixed, severe, with psychotic features: Secondary | ICD-10-CM

## 2020-04-01 ENCOUNTER — Telehealth: Payer: Self-pay | Admitting: *Deleted

## 2020-04-01 NOTE — Telephone Encounter (Signed)
Received refill request for buspar 15 mg twice daily. Last filled by behavior health, unsure if appropriate for [facility]. Please advise. Landis Gandy, RN

## 2020-04-02 ENCOUNTER — Telehealth: Payer: Self-pay

## 2020-04-02 NOTE — Telephone Encounter (Signed)
Received fax to initiate PA for Symtuza through covermymeds. Unable to submit electronically, was directed to p: [phone] to initiate. Spoke with representative who states PA is not needed. Pharmacy is billing medication incorrectly per rep. Will need to confirm pharmacy is not billing copay card.  CMA will contact pharmacy later today.

## 2020-04-03 NOTE — Telephone Encounter (Signed)
Ok for refill? 

## 2020-04-10 ENCOUNTER — Other Ambulatory Visit: Payer: Self-pay

## 2020-04-10 ENCOUNTER — Encounter: Payer: Self-pay | Admitting: Family

## 2020-04-10 ENCOUNTER — Ambulatory Visit (INDEPENDENT_AMBULATORY_CARE_PROVIDER_SITE_OTHER): Payer: Self-pay | Admitting: Family

## 2020-04-10 VITALS — BP 120/76 | HR 73 | Temp 97.7°F | Wt 159.0 lb

## 2020-04-10 DIAGNOSIS — Z113 Encounter for screening for infections with a predominantly sexual mode of transmission: Secondary | ICD-10-CM

## 2020-04-10 DIAGNOSIS — Z79899 Other long term (current) drug therapy: Secondary | ICD-10-CM

## 2020-04-10 DIAGNOSIS — F1020 Alcohol dependence, uncomplicated: Secondary | ICD-10-CM

## 2020-04-10 DIAGNOSIS — Z0000 Encounter for general adult medical examination without abnormal findings: Secondary | ICD-10-CM

## 2020-04-10 DIAGNOSIS — F4310 Post-traumatic stress disorder, unspecified: Secondary | ICD-10-CM

## 2020-04-10 DIAGNOSIS — B20 Human immunodeficiency virus [HIV] disease: Secondary | ICD-10-CM

## 2020-04-10 MED ORDER — SYMTUZA 800-150-200-10 MG PO TABS: 1.0000 | ORAL_TABLET | Freq: Every morning | ORAL | 3 refills | Status: DC

## 2020-04-10 NOTE — Assessment & Plan Note (Signed)
   Due for routine dental care which we will arrange with [facility]  Discussed importance of safe sexual practice to reduce risk of STI.  Condoms provided.  Vaccinations up-to-date per recommendations

## 2020-04-10 NOTE — Assessment & Plan Note (Signed)
John [name] continues to drink somewhere between 20 and 80 ounces of alcohol per day on average.  This likely has an effect on his ability to take his HIV medications.  He is not interested in quitting alcohol at this time.  Bridge counseling working on potential for admission to rehabilitation.

## 2020-04-10 NOTE — Assessment & Plan Note (Signed)
John [name] continues to have well-controlled HIV disease with good adherence and tolerance to his ART regimen of Symtuza.  No signs/symptoms of opportunistic infection or progressive HIV disease.  We reviewed previous lab work and discussed plan of care.  Check blood work today.  Continue current dose of Symtuza.  Plan for follow-up in 3 months or sooner if needed with lab work on the same day.

## 2020-04-10 NOTE — Assessment & Plan Note (Signed)
Continues to take his Seroquel, Remeron, and trazodone daily as prescribed.  Mental status appears to be stable.  Has psychiatric appointment in March.
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
## 2020-04-10 NOTE — Patient Instructions (Signed)
Nice to see you.  We will check your blood work today.  Continue take your Symtuza daily as prescribed.  Refills were sent to pharmacy.  We will work on getting you a dental appointment.  Plan for follow-up in 3 months or sooner if needed.  Have a great day and stay safe!

## 2020-04-11 LAB — T-HELPER CELL (CD4) - (RCID CLINIC ONLY)
CD4 % Helper T Cell: 41 % (ref 33–65)
CD4 T Cell Abs: 488 /uL (ref 400–1790)

## 2020-04-13 LAB — HIV-1 RNA QUANT-NO REFLEX-BLD
HIV 1 RNA Quant: <20 Copies/mL
HIV-1 RNA Quant, Log: <1.3 Log cps/mL

## 2020-04-13 LAB — LIPID PANEL
Cholesterol: 154 mg/dL (ref <200)
HDL: 81 mg/dL (ref >=40)
LDL Cholesterol (Calc): 60 mg/dL (calc)
Non-HDL Cholesterol (Calc): 73 mg/dL (calc) (ref <130)
Total CHOL/HDL Ratio: 1.9 (calc) (ref <5.0)
Triglycerides: 57 mg/dL (ref <150)

## 2020-04-13 LAB — COMPLETE METABOLIC PANEL WITH GFR
AG Ratio: 1.7 (calc) (ref 1.0–2.5)
ALT: 8 U/L — ABNORMAL LOW (ref 9–46)
AST: 13 U/L (ref 10–40)
Albumin: 4 g/dL (ref 3.6–5.1)
Alkaline phosphatase (APISO): 75 U/L (ref 36–130)
BUN: 19 mg/dL (ref 7–25)
CO2: 26 mmol/L (ref 20–32)
Calcium: 9.3 mg/dL (ref 8.6–10.3)
Chloride: 110 mmol/L (ref 98–110)
Creat: 0.87 mg/dL (ref 0.60–1.35)
GFR, Est African American: 118 mL/min/{1.73_m2} (ref >=60)
GFR, Est Non African American: 102 mL/min/{1.73_m2} (ref >=60)
Globulin: 2.3 g/dL (calc) (ref 1.9–3.7)
Glucose, Bld: 82 mg/dL (ref 65–99)
Potassium: 4.4 mmol/L (ref 3.5–5.3)
Sodium: 144 mmol/L (ref 135–146)
Total Bilirubin: 0.4 mg/dL (ref 0.2–1.2)
Total Protein: 6.3 g/dL (ref 6.1–8.1)

## 2020-04-13 LAB — RPR: RPR Ser Ql: NONREACTIVE

## 2020-04-28 ENCOUNTER — Other Ambulatory Visit: Payer: Self-pay

## 2020-04-28 DIAGNOSIS — J45909 Unspecified asthma, uncomplicated: Secondary | ICD-10-CM

## 2020-04-28 MED ORDER — ALBUTEROL SULFATE HFA 108 (90 BASE) MCG/ACT IN AERS: 2.0000 | INHALATION_SPRAY | RESPIRATORY_TRACT | 0 refills | Status: DC | PRN

## 2020-05-09 ENCOUNTER — Other Ambulatory Visit: Payer: Self-pay | Admitting: Family

## 2020-05-09 DIAGNOSIS — F3164 Bipolar disorder, current episode mixed, severe, with psychotic features: Secondary | ICD-10-CM

## 2020-05-09 MED ORDER — MIRTAZAPINE 30 MG PO TABS: 30.0000 mg | ORAL_TABLET | Freq: Every day | ORAL | 2 refills | Status: DC

## 2020-05-09 MED ORDER — TRAZODONE HCL 100 MG PO TABS: 100.0000 mg | ORAL_TABLET | Freq: Every day | ORAL | 2 refills | Status: DC

## 2020-05-09 MED ORDER — BUSPIRONE HCL 15 MG PO TABS
ORAL_TABLET | ORAL | 2 refills | Status: DC
Start: 2020-05-09 — End: 2020-05-22

## 2020-05-13 ENCOUNTER — Other Ambulatory Visit: Payer: Self-pay | Admitting: Family

## 2020-05-13 DIAGNOSIS — J45909 Unspecified asthma, uncomplicated: Secondary | ICD-10-CM

## 2020-05-22 ENCOUNTER — Encounter (HOSPITAL_COMMUNITY): Payer: Self-pay | Admitting: Physician Assistant

## 2020-05-22 ENCOUNTER — Ambulatory Visit (INDEPENDENT_AMBULATORY_CARE_PROVIDER_SITE_OTHER): Payer: No Payment, Other | Admitting: Physician Assistant

## 2020-05-22 ENCOUNTER — Encounter (HOSPITAL_COMMUNITY): Payer: Self-pay | Admitting: Licensed Clinical Social Worker

## 2020-05-22 ENCOUNTER — Other Ambulatory Visit: Payer: Self-pay

## 2020-05-22 ENCOUNTER — Ambulatory Visit (INDEPENDENT_AMBULATORY_CARE_PROVIDER_SITE_OTHER): Payer: No Payment, Other | Admitting: Licensed Clinical Social Worker

## 2020-05-22 VITALS — BP 117/81 | HR 68 | Ht 69.0 in | Wt 151.8 lb

## 2020-05-22 DIAGNOSIS — F411 Generalized anxiety disorder: Secondary | ICD-10-CM

## 2020-05-22 DIAGNOSIS — F3164 Bipolar disorder, current episode mixed, severe, with psychotic features: Secondary | ICD-10-CM | POA: Diagnosis not present

## 2020-05-22 DIAGNOSIS — F4310 Post-traumatic stress disorder, unspecified: Secondary | ICD-10-CM | POA: Diagnosis not present

## 2020-05-22 DIAGNOSIS — F99 Mental disorder, not otherwise specified: Secondary | ICD-10-CM

## 2020-05-22 DIAGNOSIS — F1020 Alcohol dependence, uncomplicated: Secondary | ICD-10-CM | POA: Diagnosis not present

## 2020-05-22 DIAGNOSIS — F5105 Insomnia due to other mental disorder: Secondary | ICD-10-CM

## 2020-05-22 MED ORDER — BUSPIRONE HCL 15 MG PO TABS: ORAL_TABLET | ORAL | 2 refills | Status: DC

## 2020-05-22 MED ORDER — QUETIAPINE FUMARATE 100 MG PO TABS: 100.0000 mg | ORAL_TABLET | Freq: Two times a day (BID) | ORAL | 2 refills | Status: DC

## 2020-05-22 MED ORDER — MIRTAZAPINE 30 MG PO TABS: 30.0000 mg | ORAL_TABLET | Freq: Every day | ORAL | 2 refills | Status: DC

## 2020-05-22 MED ORDER — TRAZODONE HCL 100 MG PO TABS: 100.0000 mg | ORAL_TABLET | Freq: Every day | ORAL | 2 refills | Status: DC
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
## 2020-07-03 ENCOUNTER — Encounter (HOSPITAL_COMMUNITY): Payer: No Payment, Other | Admitting: Physician Assistant

## 2020-07-10 ENCOUNTER — Other Ambulatory Visit: Payer: Self-pay

## 2020-07-10 ENCOUNTER — Ambulatory Visit (INDEPENDENT_AMBULATORY_CARE_PROVIDER_SITE_OTHER): Payer: Self-pay | Admitting: Family

## 2020-07-10 ENCOUNTER — Encounter: Payer: Self-pay | Admitting: Family

## 2020-07-10 VITALS — BP 112/70 | HR 82 | Temp 97.9°F | Wt 149.0 lb

## 2020-07-10 DIAGNOSIS — B20 Human immunodeficiency virus [HIV] disease: Secondary | ICD-10-CM

## 2020-07-10 DIAGNOSIS — F1910 Other psychoactive substance abuse, uncomplicated: Secondary | ICD-10-CM

## 2020-07-10 DIAGNOSIS — Z5900 Homelessness unspecified: Secondary | ICD-10-CM

## 2020-07-10 DIAGNOSIS — F3164 Bipolar disorder, current episode mixed, severe, with psychotic features: Secondary | ICD-10-CM

## 2020-07-10 MED ORDER — SYMTUZA 800-150-200-10 MG PO TABS: 1.0000 | ORAL_TABLET | Freq: Every morning | ORAL | 3 refills | Status: DC

## 2020-07-10 NOTE — Assessment & Plan Note (Signed)
Recently had well-controlled HIV disease and given his current use of marijuana and alcohol have concern that he is not taking his medications routinely as prescribed.  No signs/symptoms of opportunistic infection. Just does not look good today like he has in the past.  Faces many lifestyle stressors including psychiatric and inability to work which are contributing factors to his likely less than optimal adherence to his medication regimen. Discussed importance of taking medication daily as prescribed. Check lab work today. Continue current dose of Symtuza. Plan for follow up in 1 month or sooner if needed.

## 2020-07-10 NOTE — Assessment & Plan Note (Signed)
Remains homeless and [place]. Continues to work with [employer] to aid in finding shelter.

## 2020-07-10 NOTE — Assessment & Plan Note (Addendum)
John [name] does not appear to be in a good place. Denies suicidal ideations and has no current signs of psychosis, however his mental health is clearly not well controlled and question if he is taking his medications as prescribed. He has several life stressors contributing to this including homelessness, managing health, and his continued need for assistance. He is unkempt today and appears thin although BMI is 22. Continues to work with [employer] and will need additional psychiatric follow up. Clearly is not able to support himself and has been unable keep employment. Without assistance I am not sure that he would be able to overcome his current situation. Dr. Baxter Flattery previously supported disability and I certainly agree. This was discussed with the [employer] in detail.
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
## 2020-07-10 NOTE — Assessment & Plan Note (Signed)
John [name] appears sober today. Has been self-medicating with marijuana and alcohol to help increase his appetite which I believe is making his psychiatric symptoms progressively worse.

## 2020-07-10 NOTE — Patient Instructions (Addendum)
Nice to see you.   We will check your lab work today.  Continue to take your Symtuza daily.   Refills have been sent to the pharmacy.  Plan for follow up in 1 month or sooner if needed.   Have a great day and stay safe!

## 2020-07-11 LAB — T-HELPER CELL (CD4) - (RCID CLINIC ONLY)
CD4 % Helper T Cell: 46 % (ref 33–65)
CD4 T Cell Abs: 1031 /uL (ref 400–1790)

## 2020-07-14 LAB — COMPLETE METABOLIC PANEL WITH GFR
AG Ratio: 1.8 (calc) (ref 1.0–2.5)
ALT: 17 U/L (ref 9–46)
AST: 29 U/L (ref 10–40)
Albumin: 4.8 g/dL (ref 3.6–5.1)
Alkaline phosphatase (APISO): 76 U/L (ref 36–130)
BUN: 14 mg/dL (ref 7–25)
CO2: 24 mmol/L (ref 20–32)
Calcium: 9.5 mg/dL (ref 8.6–10.3)
Chloride: 102 mmol/L (ref 98–110)
Creat: 0.83 mg/dL (ref 0.60–1.35)
GFR, Est African American: 120 mL/min/{1.73_m2} (ref >=60)
GFR, Est Non African American: 103 mL/min/{1.73_m2} (ref >=60)
Globulin: 2.6 g/dL (calc) (ref 1.9–3.7)
Glucose, Bld: 66 mg/dL (ref 65–99)
Potassium: 4.2 mmol/L (ref 3.5–5.3)
Sodium: 139 mmol/L (ref 135–146)
Total Bilirubin: 0.3 mg/dL (ref 0.2–1.2)
Total Protein: 7.4 g/dL (ref 6.1–8.1)

## 2020-07-14 LAB — HIV-1 RNA QUANT-NO REFLEX-BLD
HIV 1 RNA Quant: NOT DETECTED Copies/mL
HIV-1 RNA Quant, Log: NOT DETECTED Log cps/mL

## 2020-07-15 ENCOUNTER — Other Ambulatory Visit (HOSPITAL_COMMUNITY): Payer: Self-pay | Admitting: Psychiatry

## 2020-07-15 DIAGNOSIS — F411 Generalized anxiety disorder: Secondary | ICD-10-CM

## 2020-07-15 MED ORDER — BUSPIRONE HCL 15 MG PO TABS: ORAL_TABLET | ORAL | 2 refills | Status: DC

## 2020-07-23 ENCOUNTER — Other Ambulatory Visit: Payer: Self-pay

## 2020-07-23 ENCOUNTER — Emergency Department (HOSPITAL_COMMUNITY)
Admission: EM | Admit: 2020-07-23 | Discharge: 2020-07-23 | Disposition: A | Discharge status: Home / Self Care | Payer: Medicaid Other | Attending: Emergency Medicine | Admitting: Emergency Medicine

## 2020-07-23 ENCOUNTER — Encounter (HOSPITAL_COMMUNITY): Payer: Self-pay

## 2020-07-23 DIAGNOSIS — Z79899 Other long term (current) drug therapy: Secondary | ICD-10-CM | POA: Insufficient documentation

## 2020-07-23 DIAGNOSIS — Z21 Asymptomatic human immunodeficiency virus [HIV] infection status: Secondary | ICD-10-CM | POA: Insufficient documentation

## 2020-07-23 DIAGNOSIS — F17210 Nicotine dependence, cigarettes, uncomplicated: Secondary | ICD-10-CM | POA: Diagnosis not present

## 2020-07-23 DIAGNOSIS — I10 Essential (primary) hypertension: Secondary | ICD-10-CM | POA: Insufficient documentation

## 2020-07-23 DIAGNOSIS — R0602 Shortness of breath: Secondary | ICD-10-CM | POA: Diagnosis present

## 2020-07-23 DIAGNOSIS — J4541 Moderate persistent asthma with (acute) exacerbation: Secondary | ICD-10-CM | POA: Diagnosis not present

## 2020-07-23 MED ORDER — IPRATROPIUM BROMIDE HFA 17 MCG/ACT IN AERS
2.0000 | INHALATION_SPRAY | Freq: Once | RESPIRATORY_TRACT | Status: AC
  Administered 2020-07-23: 2 via RESPIRATORY_TRACT
  Filled 2020-07-23: qty 12.9

## 2020-07-23 MED ORDER — ALBUTEROL SULFATE HFA 108 (90 BASE) MCG/ACT IN AERS
8.0000 | INHALATION_SPRAY | Freq: Once | RESPIRATORY_TRACT | Status: AC
  Administered 2020-07-23: 8 via RESPIRATORY_TRACT
  Filled 2020-07-23: qty 6.7

## 2020-07-23 MED ORDER — PREDNISONE 20 MG PO TABS
60.0000 mg | ORAL_TABLET | Freq: Once | ORAL | Status: AC
  Administered 2020-07-23: 60 mg via ORAL
  Filled 2020-07-23: qty 3

## 2020-07-23 MED ORDER — PREDNISONE 20 MG PO TABS: ORAL_TABLET | ORAL | 0 refills | Status: DC
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
## 2020-07-23 NOTE — ED Triage Notes (Signed)
[place] [facility] [place] pt c/o asthma exacerbation x2 weeks. Seen PCP yesterday, received a breathing tx but no rescue inhaler.  Upon EMS arrival pt in tripod position, gave 5mg  albuterol/500 mcg Atrovent neb ts with improvement   Pt feels his normal self. Does not want an IV, no steroids given   BP 124/77 HR 74 RR 18 98% RA

## 2020-07-23 NOTE — Discharge Instructions (Signed)
Use your inhaler every 4 hours(6 puffs) while awake, return for sudden worsening shortness of breath, or if you need to use your inhaler more often.   He can use the other inhaler 2 puffs twice a day if you like.

## 2020-08-01 ENCOUNTER — Other Ambulatory Visit: Payer: Self-pay

## 2020-08-01 ENCOUNTER — Encounter (HOSPITAL_COMMUNITY): Payer: Self-pay | Admitting: Physician Assistant

## 2020-08-01 ENCOUNTER — Ambulatory Visit (INDEPENDENT_AMBULATORY_CARE_PROVIDER_SITE_OTHER): Payer: No Payment, Other | Admitting: Physician Assistant

## 2020-08-01 DIAGNOSIS — F4310 Post-traumatic stress disorder, unspecified: Secondary | ICD-10-CM

## 2020-08-01 DIAGNOSIS — F411 Generalized anxiety disorder: Secondary | ICD-10-CM

## 2020-08-01 DIAGNOSIS — F99 Mental disorder, not otherwise specified: Secondary | ICD-10-CM

## 2020-08-01 DIAGNOSIS — F5105 Insomnia due to other mental disorder: Secondary | ICD-10-CM | POA: Diagnosis not present

## 2020-08-01 DIAGNOSIS — F3164 Bipolar disorder, current episode mixed, severe, with psychotic features: Secondary | ICD-10-CM

## 2020-08-01 MED ORDER — QUETIAPINE FUMARATE 100 MG PO TABS: 100.0000 mg | ORAL_TABLET | Freq: Two times a day (BID) | ORAL | 2 refills | Status: DC

## 2020-08-01 MED ORDER — TRAZODONE HCL 100 MG PO TABS
100.0000 mg | ORAL_TABLET | Freq: Every day | ORAL | 2 refills | Status: DC
Start: 2020-08-01 — End: 2020-09-23

## 2020-08-01 MED ORDER — BUSPIRONE HCL 15 MG PO TABS: ORAL_TABLET | ORAL | 2 refills | Status: DC

## 2020-08-01 MED ORDER — MIRTAZAPINE 30 MG PO TABS: 30.0000 mg | ORAL_TABLET | Freq: Every day | ORAL | 2 refills | Status: DC

## 2020-08-01 NOTE — Progress Notes (Signed)
BH MD/PA/NP OP Progress Note  08/01/2020 12:37 PM John [name]  MRN:  [id number]  Chief Complaint:  Chief Complaint    Medication Management     HPI:   John [name] is a 50 year old male with a past psychiatric history significant for bipolar disorder, insomnia, PTSD, and generalized anxiety disorder who presents to [facility] for follow-up and medication management.  Patient is accompanied by his case Freight forwarder.  Patient is currently being managed on the following medications:  Mirtazapine 30 mg at bedtime Buspirone 15 mg 2 times daily Trazodone 100 mg at bedtime Quetiapine 100 mg 2 times daily  Per case manager, patient recently had his second disability hearing and is currently waiting on a decision on whether or not he will be granted disability.  The patient was asked how he was doing to which he replied, "So far, so good." Case manager states the patient has showed signs of given up on life in the past and is hopeful that if the patient gets disability, he will have a much better outlook on life.  Case manager also states that the patient has been compliant with his HIV medications with his levels being low and his CD4 count being relatively normal.  Patient reports no issues or concerns regarding his current medication regimen.  Patient denies a need for dosage adjustments at this time and is requesting refills on all his medications.  Patient denies any new stressors or life changing events.  Patient states that he recently started seeing someone new and reports a good interaction with this person.  A PHQ-9 screen was performed with the patient scoring a 13.  A GAD-7 screen was also performed with the patient scoring a 9.  Patient is calm, cooperative, and fully engaged in conversation during the encounter.  Patient reports that he is in a good mood.  When asked if he was suicidal, patient stated "not yet."  When asked if he had any homicidal  ideations, patient reported "Not right now." Patient denies active auditory or visual hallucinations and does not appear to be responding to internal/external stimuli.  Patient endorses poor sleep and states that he does not sleep much.  Patient also expresses having decreased appetite and states that he has on average 1 meal a day.  On a good day, patient states that he may have 2 meals a day.  Patient endorses alcohol consumption every day and states that he drinks on average 3 - 4 40 oz a day.  Patient endorses tobacco use and states that he smokes half a pack per day.  Patient endorses illicit drug use in the form of weed.  Patient reports that when he smokes weed he is it gives him the "munchies."  Visit Diagnosis:    ICD-10-CM   1. Bipolar disorder, curr episode mixed, severe, with psychotic features ([place])  F31.64 QUEtiapine (SEROQUEL) 100 MG tablet    mirtazapine (REMERON) 30 MG tablet  2. Insomnia due to other mental disorder  F51.05 QUEtiapine (SEROQUEL) 100 MG tablet   F99 traZODone (DESYREL) 100 MG tablet    mirtazapine (REMERON) 30 MG tablet  3. PTSD (post-traumatic stress disorder)  F43.10 mirtazapine (REMERON) 30 MG tablet  4. Generalized anxiety disorder  F41.1 mirtazapine (REMERON) 30 MG tablet    busPIRone (BUSPAR) 15 MG tablet    Past Psychiatric History:  Bipolar disorder Insomnia PTSD Generalized anxiety disorder  Past Medical History:  Past Medical History:  Diagnosis Date  . Alcohol abuse   .  Alcoholism ([place])   . Anxiety   . Asthma   . Asthma   . Bipolar disorder ([place])   . Chronic low back pain   . Cocaine abuse ([place])   . Depression   . Gout   . Gout   . HIV (human immunodeficiency virus infection) ([place])    "dx'd ~ 2 yr ago" (09/29/2012)  . HIV (human immunodeficiency virus infection) ([place])   . HIV disease ([place])   . Homelessness   . Hypertension   . Marijuana abuse   . Mental disorder   . Schizophrenia [facility])     Past Surgical History:  Procedure  Laterality Date  . SKIN GRAFT FULL THICKNESS LEG Left ?   POSTERIOR LEFT LEG  AFTER DOG BITES    Family Psychiatric History:  Patient's mother has a history of mental health issues and substance abuse  Family History:  Family History  Problem Relation Age of Onset  . Alcoholism Mother   . Depression Mother   . Alcoholism Brother     Social History:  Social History   Socioeconomic History  . Marital status: Single    Spouse name: Not on file  . Number of children: 0  . Years of education: Not on file  . Highest education level: 9th grade  Occupational History  . Occupation: unemployed  Tobacco Use  . Smoking status: Current Every Day Smoker    Packs/day: 0.50    Years: 27.00    Pack years: 13.50    Types: Cigarettes  . Smokeless tobacco: Never Used  . Tobacco comment: cutting back  Vaping Use  . Vaping Use: Never used  Substance and Sexual Activity  . Alcohol use: Yes    Alcohol/week: 9.0 standard drinks    Types: 9 Cans of beer per week    Comment: "as much as he can put in front me"   . Drug use: Yes    Types: Marijuana, "Crack" cocaine    Comment: 03/11/2020 3-200z per week  . Sexual activity: Not Currently    Partners: Male    Birth control/protection: Condom    Comment: given condoms 04/10/20  Other Topics Concern  . Not on file  Social History Narrative   ** Merged History Encounter **       ** Merged History Encounter **       Social Determinants of Health   Financial Resource Strain: High Risk  . Difficulty of Paying Living Expenses: Very hard  Food Insecurity: Food Insecurity Present  . Worried About Charity fundraiser in the Last Year: Sometimes true  . Ran Out of Food in the Last Year: Sometimes true  Transportation Needs: Unmet Transportation Needs  . Lack of Transportation (Medical): Yes  . Lack of Transportation (Non-Medical): Yes  Physical Activity: Inactive  . Days of Exercise per Week: 0 days  . Minutes of Exercise per Session: 0  min  Stress: Stress Concern Present  . Feeling of Stress : Rather much  Social Connections: Moderately Isolated  . Frequency of Communication with Friends and Family: Three times a week  . Frequency of Social Gatherings with Friends and Family: Three times a week  . Attends Religious Services: Never  . Active Member of Clubs or Organizations: Yes  . Attends Archivist Meetings: More than 4 times per year  . Marital Status: Never married    Allergies:  Allergies  Allergen Reactions  . Shellfish Allergy Anaphylaxis and Swelling  . Shellfish Allergy Anaphylaxis  Metabolic Disorder Labs: Lab Results  Component Value Date   HGBA1C 5.0 03/24/2019   MPG 96.8 03/24/2019   MPG 114 04/23/2014   Lab Results  Component Value Date   PROLACTIN 6.7 09/29/2012   Lab Results  Component Value Date   CHOL 154 04/10/2020   TRIG 57 04/10/2020   HDL 81 04/10/2020   CHOLHDL 1.9 04/10/2020   VLDL 23 03/24/2019   LDLCALC 60 04/10/2020   LDLCALC 43 03/24/2019   Lab Results  Component Value Date   TSH 1.263 03/24/2019   TSH 1.660 01/11/2014    Therapeutic Level Labs: No results found for: LITHIUM Lab Results  Component Value Date   VALPROATE 59 04/06/2015   VALPROATE 95 11/18/2014   No components found for:  CBMZ  Current Medications: Current Outpatient Medications  Medication Sig Dispense Refill  . albuterol (VENTOLIN HFA) 108 (90 Base) MCG/ACT inhaler INHALE 2 PUFFS INTO THE LUNGS EVERY 4 HOURS AS NEEDED FOR WHEEZING OR SHORTNESS OF BREATH 18 g 0  . predniSONE (DELTASONE) 20 MG tablet 2 tabs po daily x 4 days 8 tablet 0  . SYMTUZA 800-150-200-10 MG TABS Take 1 tablet by mouth every morning. 30 tablet 3  . busPIRone (BUSPAR) 15 MG tablet TAKE 1 TABLET(15 MG) BY MOUTH TWICE DAILY 60 tablet 2  . mirtazapine (REMERON) 30 MG tablet Take 1 tablet (30 mg total) by mouth at bedtime. 30 tablet 2  . QUEtiapine (SEROQUEL) 100 MG tablet Take 1 tablet (100 mg total) by mouth 2  (two) times daily. 60 tablet 2  . traZODone (DESYREL) 100 MG tablet Take 1 tablet (100 mg total) by mouth at bedtime. 30 tablet 2   No current facility-administered medications for this visit.     Musculoskeletal: Strength & Muscle Tone: within normal limits Gait & Station: normal Patient leans: N/A  Psychiatric Specialty Exam: Review of Systems  Psychiatric/Behavioral: Positive for agitation and sleep disturbance. Negative for decreased concentration, dysphoric mood, hallucinations, self-injury and suicidal ideas. The patient is nervous/anxious. The patient is not hyperactive.     Blood pressure 124/80, pulse 83, height 5\' 9"  (1.753 m), weight 144 lb (65.3 kg).Body mass index is 21.27 kg/m.  General Appearance: Fairly Groomed  Eye Contact:  Good  Speech:  Clear and Coherent and Normal Rate  Volume:  Normal  Mood:  Anxious and Depressed  Affect:  Congruent and Depressed  Thought Process:  Coherent and Descriptions of Associations: Intact  Orientation:  Full (Time, Place, and Person)  Thought Content: WDL   Suicidal Thoughts:  No  Homicidal Thoughts:  No  Memory:  Immediate;   Good Recent;   Fair Remote;   Fair  Judgement:  Good  Insight:  Fair  Psychomotor Activity:  Restlessness  Concentration:  Concentration: Good and Attention Span: Good  Recall:  Good  Fund of Knowledge: Fair  Language: Good  Akathisia:  NA  Handed:  Right  AIMS (if indicated): not done  Assets:  Communication Skills Desire for Improvement Social Support Vocational/Educational  ADL's:  Intact  Cognition: WNL  Sleep:  Poor   Screenings: AIMS   Flowsheet Row Admission (Discharged) from 03/23/2019 in [place] [facility] Admission (Discharged) from 03/31/2015 in [place] [facility] Admission (Discharged) from 11/14/2014 in [place] Total Score 0 0 0    AUDIT   Flowsheet Row Counselor from 05/22/2020 in [facility] Admission (Discharged) from 03/23/2019 in [facility] Admission (Discharged) from 03/31/2015 in [place] [facility] Admission (Discharged) from 11/14/2014 in [place] Admission (Discharged) from 04/18/2014 in [facility] [facility]  Alcohol Use Disorder Identification Test Final Score (AUDIT) 32 28 12 12 16     GAD-7   Flowsheet Row Office Visit from 08/01/2020 in [facility] Office Visit from 05/22/2020 in [facility]  Total GAD-7 Score 9 19    PHQ2-9   [place] Office Visit from 08/01/2020 in [facility] Office Visit from 05/22/2020 in [facility] Office Visit from 04/10/2020 in [facility]ious Disease ED from 03/12/2020 in [facility] Office Visit from 01/03/2020 in [facility] for Infectious Disease  PHQ-2 Total Score 2 5 0 6 3  PHQ-9 Total Score 13 21 -- 27 John [name] from 08/01/2020 in [facility] ED from 07/23/2020 in [place] Counselor from 05/22/2020 in [place] High Risk No Risk Low Risk       Assessment and Plan:   Tajuan Dufault is a 50 year old male with a past psychiatric history significant for bipolar disorder, insomnia, PTSD, and generalized anxiety disorder who presents to [facility] for follow-up and medication management. Patient is accompanied by his case Freight forwarder. Patient presents to the encounter in a relatively good mood.  Per case manager, patient recently had his second disability hearing and will hopefully have a decision or not he receives disability. Patient reports no issues or  concerns with his current medication regimen.  Patient denies need for dosage adjustments at this time and is requesting refills on all medications.  Patient's medications to be e-prescribed to pharmacy of choice.  1. Bipolar disorder, curr episode mixed, severe, with psychotic features ([place])  - QUEtiapine (SEROQUEL) 100 MG tablet; Take 1 tablet (100 mg total) by mouth 2 (two) times daily.  Dispense: 60 tablet; Refill: 2 - mirtazapine (REMERON) 30 MG tablet; Take 1 tablet (30 mg total) by mouth at bedtime.  Dispense: 30 tablet; Refill: 2  2. Insomnia due to other mental disorder  - QUEtiapine (SEROQUEL) 100 MG tablet; Take 1 tablet (100 mg total) by mouth 2 (two) times daily.  Dispense: 60 tablet; Refill: 2 - traZODone (DESYREL) 100 MG tablet; Take 1 tablet (100 mg total) by mouth at bedtime.  Dispense: 30 tablet; Refill: 2 - mirtazapine (REMERON) 30 MG tablet; Take 1 tablet (30 mg total) by mouth at bedtime.  Dispense: 30 tablet; Refill: 2  3. PTSD (post-traumatic stress disorder)  - mirtazapine (REMERON) 30 MG tablet; Take 1 tablet (30 mg total) by mouth at bedtime.  Dispense: 30 tablet; Refill: 2  4. Generalized anxiety disorder  - mirtazapine (REMERON) 30 MG tablet; Take 1 tablet (30 mg total) by mouth at bedtime.  Dispense: 30 tablet; Refill: 2 - busPIRone (BUSPAR) 15 MG tablet; TAKE 1 TABLET(15 MG) BY MOUTH TWICE DAILY  Dispense: 60 tablet; Refill: 2  Patient to follow-up in 6 weeks  Malachy Mood, PA 08/01/2020, 12:37 PM

## 2020-08-07 ENCOUNTER — Ambulatory Visit (INDEPENDENT_AMBULATORY_CARE_PROVIDER_SITE_OTHER): Payer: Self-pay | Admitting: Family

## 2020-08-07 ENCOUNTER — Encounter: Payer: Self-pay | Admitting: Family

## 2020-08-07 ENCOUNTER — Other Ambulatory Visit: Payer: Self-pay

## 2020-08-07 VITALS — BP 113/74 | HR 87 | Resp 16 | Ht 69.0 in | Wt 145.0 lb

## 2020-08-07 DIAGNOSIS — M5441 Lumbago with sciatica, right side: Secondary | ICD-10-CM

## 2020-08-07 DIAGNOSIS — G8929 Other chronic pain: Secondary | ICD-10-CM

## 2020-08-07 DIAGNOSIS — Z21 Asymptomatic human immunodeficiency virus [HIV] infection status: Secondary | ICD-10-CM

## 2020-08-07 MED ORDER — PREDNISONE 50 MG PO TABS: ORAL_TABLET | ORAL | 0 refills | Status: DC

## 2020-08-07 NOTE — Assessment & Plan Note (Signed)
John [name] has right-sided back pain likely related to advanced facet arthropathy and multifactorial degenerative changes at L4-L5 which are exacerbated by sleeping on the ground.  We will check inflammatory markers to rule out possibility of infection.  Start prednisone to help decrease inflammation.  Encouraged ice/heat every 2 hours as needed with home exercise therapy as tolerated.  May require additional evaluation and treatment if symptoms worsen or do not improve.
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
## 2020-08-07 NOTE — Patient Instructions (Signed)
Nice to see you.  We will check your lab work today.  Continue to take your medication daily.  A prescription for prednisone was sent to the pharmacy for your back.  If your symptoms worsen or do not improve may need additional imaging.   Plan for follow up in 3 months for HIV.   Hope you back feels better.

## 2020-08-07 NOTE — Assessment & Plan Note (Signed)
John [name] continues to have well-controlled HIV disease with good adherence and tolerance to his ART regimen of Symtuza.  Check viral load today.  Continue current dose of Symtuza.  Plan for follow-up in 3 months or sooner if needed with lab work on the same day.

## 2020-08-11 LAB — C-REACTIVE PROTEIN: CRP: 4.3 mg/L (ref <8.0)

## 2020-08-11 LAB — SEDIMENTATION RATE: Sed Rate: 6 mm/h (ref 0–15)

## 2020-08-11 LAB — HIV-1 RNA QUANT-NO REFLEX-BLD
HIV 1 RNA Quant: NOT DETECTED Copies/mL
HIV-1 RNA Quant, Log: NOT DETECTED Log cps/mL

## 2020-08-20 ENCOUNTER — Other Ambulatory Visit: Payer: Self-pay

## 2020-08-20 ENCOUNTER — Emergency Department (HOSPITAL_COMMUNITY): Payer: Medicaid Other

## 2020-08-20 ENCOUNTER — Emergency Department (HOSPITAL_COMMUNITY)
Admission: EM | Admit: 2020-08-20 | Discharge: 2020-08-21 | Disposition: A | Discharge status: Home / Self Care | Payer: Medicaid Other | Attending: Emergency Medicine | Admitting: Emergency Medicine

## 2020-08-20 ENCOUNTER — Encounter (HOSPITAL_COMMUNITY): Payer: Self-pay

## 2020-08-20 DIAGNOSIS — Z79899 Other long term (current) drug therapy: Secondary | ICD-10-CM | POA: Insufficient documentation

## 2020-08-20 DIAGNOSIS — F17210 Nicotine dependence, cigarettes, uncomplicated: Secondary | ICD-10-CM | POA: Diagnosis not present

## 2020-08-20 DIAGNOSIS — I10 Essential (primary) hypertension: Secondary | ICD-10-CM | POA: Insufficient documentation

## 2020-08-20 DIAGNOSIS — Z20822 Contact with and (suspected) exposure to covid-19: Secondary | ICD-10-CM | POA: Insufficient documentation

## 2020-08-20 DIAGNOSIS — U071 COVID-19: Secondary | ICD-10-CM | POA: Diagnosis not present

## 2020-08-20 DIAGNOSIS — J45901 Unspecified asthma with (acute) exacerbation: Secondary | ICD-10-CM | POA: Insufficient documentation

## 2020-08-20 DIAGNOSIS — B20 Human immunodeficiency virus [HIV] disease: Secondary | ICD-10-CM | POA: Insufficient documentation

## 2020-08-20 DIAGNOSIS — J4541 Moderate persistent asthma with (acute) exacerbation: Secondary | ICD-10-CM

## 2020-08-20 DIAGNOSIS — R0602 Shortness of breath: Secondary | ICD-10-CM | POA: Diagnosis present

## 2020-08-20 MED ORDER — DEXAMETHASONE 4 MG PO TABS
10.0000 mg | ORAL_TABLET | Freq: Once | ORAL | Status: AC
  Administered 2020-08-21: 10 mg via ORAL
  Filled 2020-08-20: qty 3

## 2020-08-20 MED ORDER — ALBUTEROL SULFATE (2.5 MG/3ML) 0.083% IN NEBU
5.0000 mg | INHALATION_SOLUTION | Freq: Once | RESPIRATORY_TRACT | Status: AC
  Administered 2020-08-21: 5 mg via RESPIRATORY_TRACT
  Filled 2020-08-20: qty 6

## 2020-08-20 MED ORDER — IPRATROPIUM BROMIDE 0.02 % IN SOLN
0.5000 mg | Freq: Once | RESPIRATORY_TRACT | Status: AC
  Administered 2020-08-21: 0.5 mg via RESPIRATORY_TRACT
  Filled 2020-08-20: qty 2.5
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
## 2020-08-20 NOTE — ED Triage Notes (Signed)
Patient arrived by [facility] with complaint of asthma exacerbation. Patient received treatment prior to arrival, states feeling better. NAD. Had + covid 2-3 weeks ago

## 2020-08-20 NOTE — ED Provider Notes (Signed)
Emergency Medicine Provider Triage Evaluation Note  John [name] , a 50 y.o. male  was evaluated in triage.  Pt complains of sob that started this morning that he felt like his asthma was flaring up. He had some wheezing this am. He has had a cough for about 4-5 weeks. States that his case worker called and told him he was covid positive. States the test was 1-3 weeks ago, pt does not know when the test was. He got a neb tx en route and he feels improved   Review of Systems  Positive: Sob, cough, wheezing Negative: Chest pain, fevers  Physical Exam  BP 108/65 (BP Location: Left Arm)   Pulse 70   Temp 98.1 F (36.7 C)   Resp 18   SpO2 98%  Gen:   Awake, no distress   Resp:  Normal effort  MSK:   Moves extremities without difficulty  Other:  Lungs ctab  Medical Decision Making  Medically screening exam initiated at 1:18 PM.  Appropriate orders placed.  Alba Kriesel was informed that the remainder of the evaluation will be completed by another provider, this initial triage assessment does not replace that evaluation, and the importance of remaining in the ED until their evaluation is complete.     Rodney Booze, PA-C 08/20/20 1322    Lajean Saver, MD 08/22/20 1606

## 2020-08-21 LAB — RESP PANEL BY RT-PCR (FLU A&B, COVID) ARPGX2
Influenza A by PCR: NEGATIVE
Influenza B by PCR: NEGATIVE
SARS Coronavirus 2 by RT PCR: POSITIVE — AB

## 2020-08-21 MED ORDER — ALBUTEROL SULFATE (2.5 MG/3ML) 0.083% IN NEBU
INHALATION_SOLUTION | RESPIRATORY_TRACT | Status: AC
  Filled 2020-08-21: qty 3

## 2020-08-21 MED ORDER — ALBUTEROL SULFATE HFA 108 (90 BASE) MCG/ACT IN AERS
2.0000 | INHALATION_SPRAY | RESPIRATORY_TRACT | Status: DC | PRN
  Administered 2020-08-21: 2 via RESPIRATORY_TRACT
  Filled 2020-08-21: qty 6.7

## 2020-08-21 MED ORDER — ALBUTEROL SULFATE (2.5 MG/3ML) 0.083% IN NEBU
5.0000 mg | INHALATION_SOLUTION | RESPIRATORY_TRACT | Status: AC
  Administered 2020-08-21: 5 mg via RESPIRATORY_TRACT
  Filled 2020-08-21: qty 6

## 2020-08-21 MED ORDER — IPRATROPIUM BROMIDE 0.02 % IN SOLN
0.5000 mg | RESPIRATORY_TRACT | Status: AC
  Administered 2020-08-21: 0.5 mg via RESPIRATORY_TRACT
  Filled 2020-08-21: qty 2.5

## 2020-08-21 NOTE — ED Notes (Signed)
Patient verbalized understanding of discharge instructions. Opportunity for questions and answers.  

## 2020-08-21 NOTE — ED Notes (Signed)
Pulse ox sat 96% RA while ambulating.

## 2020-08-21 NOTE — Discharge Instructions (Addendum)
Use the inhaler every 4 hours as needed. You need to quit smoking cigarettes to prevent worsening asthma/COPD.   Follow up with your doctor for recheck as needed.

## 2020-08-25 ENCOUNTER — Other Ambulatory Visit: Payer: Self-pay | Admitting: Family

## 2020-08-25 DIAGNOSIS — Z21 Asymptomatic human immunodeficiency virus [HIV] infection status: Secondary | ICD-10-CM

## 2020-08-26 ENCOUNTER — Other Ambulatory Visit: Payer: Self-pay | Admitting: Infectious Diseases

## 2020-08-26 DIAGNOSIS — Z21 Asymptomatic human immunodeficiency virus [HIV] infection status: Secondary | ICD-10-CM

## 2020-09-09 ENCOUNTER — Encounter (HOSPITAL_COMMUNITY): Payer: No Payment, Other | Admitting: Physician Assistant

## 2020-09-16 ENCOUNTER — Other Ambulatory Visit: Payer: Self-pay

## 2020-09-16 ENCOUNTER — Emergency Department (HOSPITAL_COMMUNITY)
Admission: EM | Admit: 2020-09-16 | Discharge: 2020-09-17 | Disposition: A | Discharge status: Home / Self Care | Payer: Medicaid Other | Attending: Emergency Medicine | Admitting: Emergency Medicine

## 2020-09-16 ENCOUNTER — Encounter (HOSPITAL_COMMUNITY): Payer: Self-pay

## 2020-09-16 DIAGNOSIS — Z21 Asymptomatic human immunodeficiency virus [HIV] infection status: Secondary | ICD-10-CM | POA: Diagnosis not present

## 2020-09-16 DIAGNOSIS — I10 Essential (primary) hypertension: Secondary | ICD-10-CM | POA: Diagnosis not present

## 2020-09-16 DIAGNOSIS — R45851 Suicidal ideations: Secondary | ICD-10-CM

## 2020-09-16 DIAGNOSIS — F1994 Other psychoactive substance use, unspecified with psychoactive substance-induced mood disorder: Secondary | ICD-10-CM | POA: Diagnosis present

## 2020-09-16 DIAGNOSIS — F17210 Nicotine dependence, cigarettes, uncomplicated: Secondary | ICD-10-CM | POA: Insufficient documentation

## 2020-09-16 DIAGNOSIS — F411 Generalized anxiety disorder: Secondary | ICD-10-CM | POA: Diagnosis present

## 2020-09-16 DIAGNOSIS — F1420 Cocaine dependence, uncomplicated: Secondary | ICD-10-CM | POA: Diagnosis not present

## 2020-09-16 DIAGNOSIS — J45901 Unspecified asthma with (acute) exacerbation: Secondary | ICD-10-CM | POA: Insufficient documentation

## 2020-09-16 DIAGNOSIS — R062 Wheezing: Secondary | ICD-10-CM

## 2020-09-16 DIAGNOSIS — R4589 Other symptoms and signs involving emotional state: Secondary | ICD-10-CM | POA: Diagnosis present

## 2020-09-16 DIAGNOSIS — F1220 Cannabis dependence, uncomplicated: Secondary | ICD-10-CM | POA: Diagnosis present

## 2020-09-16 DIAGNOSIS — F1020 Alcohol dependence, uncomplicated: Secondary | ICD-10-CM | POA: Diagnosis not present

## 2020-09-16 DIAGNOSIS — F3164 Bipolar disorder, current episode mixed, severe, with psychotic features: Secondary | ICD-10-CM | POA: Diagnosis not present

## 2020-09-16 DIAGNOSIS — F3130 Bipolar disorder, current episode depressed, mild or moderate severity, unspecified: Secondary | ICD-10-CM | POA: Diagnosis present

## 2020-09-16 DIAGNOSIS — Z20822 Contact with and (suspected) exposure to covid-19: Secondary | ICD-10-CM | POA: Insufficient documentation

## 2020-09-16 DIAGNOSIS — F1990 Other psychoactive substance use, unspecified, uncomplicated: Secondary | ICD-10-CM | POA: Diagnosis present

## 2020-09-16 DIAGNOSIS — F1910 Other psychoactive substance abuse, uncomplicated: Secondary | ICD-10-CM | POA: Diagnosis present

## 2020-09-16 DIAGNOSIS — F4310 Post-traumatic stress disorder, unspecified: Secondary | ICD-10-CM | POA: Diagnosis present

## 2020-09-16 DIAGNOSIS — Z79899 Other long term (current) drug therapy: Secondary | ICD-10-CM | POA: Diagnosis not present

## 2020-09-16 LAB — COMPREHENSIVE METABOLIC PANEL
ALT: 17 U/L (ref 0–44)
AST: 23 U/L (ref 15–41)
Albumin: 4 g/dL (ref 3.5–5.0)
Alkaline Phosphatase: 91 U/L (ref 38–126)
Anion gap: 11 (ref 5–15)
BUN: 13 mg/dL (ref 6–20)
CO2: 24 mmol/L (ref 22–32)
Calcium: 9.4 mg/dL (ref 8.9–10.3)
Chloride: 104 mmol/L (ref 98–111)
Creatinine, Ser: 1.18 mg/dL (ref 0.61–1.24)
GFR, Estimated: >60 mL/min (ref >60)
Glucose, Bld: 60 mg/dL — ABNORMAL LOW (ref 70–99)
Potassium: 4.1 mmol/L (ref 3.5–5.1)
Sodium: 139 mmol/L (ref 135–145)
Total Bilirubin: 0.4 mg/dL (ref 0.3–1.2)
Total Protein: 7.1 g/dL (ref 6.5–8.1)

## 2020-09-16 LAB — CBC WITH DIFFERENTIAL/PLATELET
Abs Immature Granulocytes: 0 10*3/uL (ref 0.00–0.07)
Basophils Absolute: 0.1 10*3/uL (ref 0.0–0.1)
Basophils Relative: 3 %
Eosinophils Absolute: 0.5 10*3/uL (ref 0.0–0.5)
Eosinophils Relative: 12 %
HCT: 47.7 % (ref 39.0–52.0)
Hemoglobin: 15.6 g/dL (ref 13.0–17.0)
Lymphocytes Relative: 59 %
Lymphs Abs: 2.4 10*3/uL (ref 0.7–4.0)
MCH: 32.8 pg (ref 26.0–34.0)
MCHC: 32.7 g/dL (ref 30.0–36.0)
MCV: 100.4 fL — ABNORMAL HIGH (ref 80.0–100.0)
Monocytes Absolute: 0.3 10*3/uL (ref 0.1–1.0)
Monocytes Relative: 8 %
Neutro Abs: 0.7 10*3/uL — ABNORMAL LOW (ref 1.7–7.7)
Neutrophils Relative %: 18 %
Platelets: 405 10*3/uL — ABNORMAL HIGH (ref 150–400)
RBC: 4.75 MIL/uL (ref 4.22–5.81)
RDW: 12.6 % (ref 11.5–15.5)
WBC: 4 10*3/uL (ref 4.0–10.5)
nRBC: 0 % (ref 0.0–0.2)
nRBC: 0 /100 WBC

## 2020-09-16 LAB — CBG MONITORING, ED: Glucose-Capillary: 107 mg/dL — ABNORMAL HIGH (ref 70–99)

## 2020-09-16 LAB — RESP PANEL BY RT-PCR (FLU A&B, COVID) ARPGX2
Influenza A by PCR: NEGATIVE
Influenza B by PCR: NEGATIVE
SARS Coronavirus 2 by RT PCR: NEGATIVE

## 2020-09-16 LAB — ETHANOL: Alcohol, Ethyl (B): <10 mg/dL (ref <10)

## 2020-09-16 MED ORDER — PREDNISONE 20 MG PO TABS
60.0000 mg | ORAL_TABLET | Freq: Once | ORAL | Status: AC
  Administered 2020-09-16: 60 mg via ORAL
  Filled 2020-09-16: qty 3

## 2020-09-16 MED ORDER — ALBUTEROL SULFATE HFA 108 (90 BASE) MCG/ACT IN AERS
2.0000 | INHALATION_SPRAY | Freq: Once | RESPIRATORY_TRACT | Status: AC
  Administered 2020-09-16: 2 via RESPIRATORY_TRACT
  Filled 2020-09-16: qty 6.7

## 2020-09-16 NOTE — ED Triage Notes (Signed)
Patient arrived by [facility] with complaint of asthma complaints and needing inhaler. Patient received albuterol treatment x 2 pta- patient in NAD

## 2020-09-16 NOTE — ED Notes (Signed)
Called x3 for vitals, no answer.

## 2020-09-16 NOTE — BH Assessment (Signed)
Per Nolon Lennert, RN, "Not IVC at this time. He is in a hallway all rooms are occupied at this time."   Clinician asked RN to let her know when a room is a available to complete the pt's assessment. RN agreed.    Vertell Novak, [place], [facility], [facility] Triage Specialist [phone]

## 2020-09-16 NOTE — ED Notes (Signed)
Pt given sandwich bag and diet ginger ale

## 2020-09-16 NOTE — ED Notes (Signed)
Pt wanded by security, belongings placed in locker number 1

## 2020-09-16 NOTE — BH Assessment (Signed)
Clinician sent a message to Nolon Lennert, RN, "if the pt is placed in a private room his assessment can be completed, also is the pt under IVC?"   Clinician awaiting response.    Vertell Novak, [place], [facility], [facility] Triage Specialist [phone]
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
## 2020-09-17 ENCOUNTER — Encounter (HOSPITAL_COMMUNITY): Payer: Self-pay | Admitting: Emergency Medicine

## 2020-09-17 ENCOUNTER — Other Ambulatory Visit: Payer: Self-pay

## 2020-09-17 ENCOUNTER — Encounter (HOSPITAL_COMMUNITY): Payer: Self-pay | Admitting: Registered Nurse

## 2020-09-17 ENCOUNTER — Other Ambulatory Visit: Payer: Self-pay | Admitting: Registered Nurse

## 2020-09-17 ENCOUNTER — Inpatient Hospital Stay (HOSPITAL_COMMUNITY)
Admission: AD | Admit: 2020-09-17 | Discharge: 2020-09-23 | DRG: 885 | Disposition: A | Discharge status: Home / Self Care | Payer: Federal, State, Local not specified - Other | Source: Intra-hospital | Attending: Emergency Medicine | Admitting: Emergency Medicine

## 2020-09-17 DIAGNOSIS — F1990 Other psychoactive substance use, unspecified, uncomplicated: Secondary | ICD-10-CM | POA: Diagnosis present

## 2020-09-17 DIAGNOSIS — Z9151 Personal history of suicidal behavior: Secondary | ICD-10-CM

## 2020-09-17 DIAGNOSIS — Z5900 Homelessness unspecified: Secondary | ICD-10-CM

## 2020-09-17 DIAGNOSIS — F333 Major depressive disorder, recurrent, severe with psychotic symptoms: Secondary | ICD-10-CM | POA: Diagnosis present

## 2020-09-17 DIAGNOSIS — F3164 Bipolar disorder, current episode mixed, severe, with psychotic features: Secondary | ICD-10-CM | POA: Diagnosis not present

## 2020-09-17 DIAGNOSIS — F322 Major depressive disorder, single episode, severe without psychotic features: Secondary | ICD-10-CM | POA: Insufficient documentation

## 2020-09-17 DIAGNOSIS — R45851 Suicidal ideations: Secondary | ICD-10-CM | POA: Diagnosis present

## 2020-09-17 DIAGNOSIS — Z79899 Other long term (current) drug therapy: Secondary | ICD-10-CM

## 2020-09-17 DIAGNOSIS — F1420 Cocaine dependence, uncomplicated: Secondary | ICD-10-CM | POA: Diagnosis present

## 2020-09-17 DIAGNOSIS — F1994 Other psychoactive substance use, unspecified with psychoactive substance-induced mood disorder: Secondary | ICD-10-CM | POA: Diagnosis present

## 2020-09-17 DIAGNOSIS — F17210 Nicotine dependence, cigarettes, uncomplicated: Secondary | ICD-10-CM | POA: Diagnosis present

## 2020-09-17 DIAGNOSIS — F1910 Other psychoactive substance abuse, uncomplicated: Secondary | ICD-10-CM | POA: Diagnosis present

## 2020-09-17 DIAGNOSIS — F1220 Cannabis dependence, uncomplicated: Secondary | ICD-10-CM | POA: Diagnosis present

## 2020-09-17 DIAGNOSIS — G4700 Insomnia, unspecified: Secondary | ICD-10-CM | POA: Diagnosis present

## 2020-09-17 DIAGNOSIS — Z818 Family history of other mental and behavioral disorders: Secondary | ICD-10-CM | POA: Diagnosis not present

## 2020-09-17 DIAGNOSIS — F1020 Alcohol dependence, uncomplicated: Secondary | ICD-10-CM | POA: Diagnosis present

## 2020-09-17 DIAGNOSIS — B20 Human immunodeficiency virus [HIV] disease: Secondary | ICD-10-CM | POA: Diagnosis present

## 2020-09-17 HISTORY — DX: Major depressive disorder, single episode, severe without psychotic features: F32.2

## 2020-09-17 LAB — PATHOLOGIST SMEAR REVIEW

## 2020-09-17 MED ORDER — THIAMINE HCL 100 MG/ML IJ SOLN: 100.0000 mg | Freq: Once | INTRAMUSCULAR | Status: DC

## 2020-09-17 MED ORDER — HYDROXYZINE HCL 25 MG PO TABS
25.0000 mg | ORAL_TABLET | Freq: Four times a day (QID) | ORAL | Status: AC | PRN
  Filled 2020-09-17: qty 1

## 2020-09-17 MED ORDER — QUETIAPINE FUMARATE 100 MG PO TABS
100.0000 mg | ORAL_TABLET | Freq: Every day | ORAL | Status: DC
  Administered 2020-09-18 – 2020-09-22 (×5): 100 mg via ORAL
  Filled 2020-09-17 (×8): qty 1

## 2020-09-17 MED ORDER — ALUM & MAG HYDROXIDE-SIMETH 200-200-20 MG/5ML PO SUSP: 30.0000 mL | ORAL | Status: DC | PRN

## 2020-09-17 MED ORDER — TRAZODONE HCL 100 MG PO TABS: 100.0000 mg | ORAL_TABLET | Freq: Every day | ORAL | Status: DC

## 2020-09-17 MED ORDER — TRAZODONE HCL 50 MG PO TABS
50.0000 mg | ORAL_TABLET | Freq: Every evening | ORAL | Status: DC | PRN
  Administered 2020-09-19 – 2020-09-20 (×2): 50 mg via ORAL
  Filled 2020-09-17 (×3): qty 1

## 2020-09-17 MED ORDER — LOPERAMIDE HCL 2 MG PO CAPS: 2.0000 mg | ORAL_CAPSULE | ORAL | Status: AC | PRN

## 2020-09-17 MED ORDER — ONDANSETRON 4 MG PO TBDP: 4.0000 mg | ORAL_TABLET | Freq: Four times a day (QID) | ORAL | Status: AC | PRN

## 2020-09-17 MED ORDER — QUETIAPINE FUMARATE 100 MG PO TABS
100.0000 mg | ORAL_TABLET | Freq: Two times a day (BID) | ORAL | Status: DC
  Filled 2020-09-17: qty 1

## 2020-09-17 MED ORDER — LORAZEPAM 1 MG PO TABS: 1.0000 mg | ORAL_TABLET | Freq: Four times a day (QID) | ORAL | Status: AC | PRN

## 2020-09-17 MED ORDER — MAGNESIUM HYDROXIDE 400 MG/5ML PO SUSP: 30.0000 mL | Freq: Every day | ORAL | Status: DC | PRN

## 2020-09-17 MED ORDER — DARUN-COBIC-EMTRICIT-TENOFAF 800-150-200-10 MG PO TABS
1.0000 | ORAL_TABLET | Freq: Every day | ORAL | Status: DC
  Administered 2020-09-18 – 2020-09-23 (×6): 1 via ORAL
  Filled 2020-09-17 (×7): qty 1

## 2020-09-17 MED ORDER — ADULT MULTIVITAMIN W/MINERALS CH
1.0000 | ORAL_TABLET | Freq: Every day | ORAL | Status: DC
  Administered 2020-09-18 – 2020-09-23 (×6): 1 via ORAL
  Filled 2020-09-17 (×9): qty 1

## 2020-09-17 MED ORDER — DARUN-COBIC-EMTRICIT-TENOFAF 800-150-200-10 MG PO TABS
1.0000 | ORAL_TABLET | Freq: Every morning | ORAL | Status: DC
  Filled 2020-09-17: qty 1

## 2020-09-17 MED ORDER — ALBUTEROL SULFATE HFA 108 (90 BASE) MCG/ACT IN AERS
2.0000 | INHALATION_SPRAY | RESPIRATORY_TRACT | Status: DC | PRN
  Administered 2020-09-18 – 2020-09-21 (×2): 2 via RESPIRATORY_TRACT
  Filled 2020-09-17: qty 6.7

## 2020-09-17 MED ORDER — ACETAMINOPHEN 325 MG PO TABS
650.0000 mg | ORAL_TABLET | Freq: Four times a day (QID) | ORAL | Status: DC | PRN
  Administered 2020-09-20 – 2020-09-22 (×2): 650 mg via ORAL
  Filled 2020-09-17 (×2): qty 2

## 2020-09-17 MED ORDER — THIAMINE HCL 100 MG PO TABS
100.0000 mg | ORAL_TABLET | Freq: Every day | ORAL | Status: DC
  Administered 2020-09-18 – 2020-09-23 (×6): 100 mg via ORAL
  Filled 2020-09-17 (×8): qty 1

## 2020-09-17 MED ORDER — TRAZODONE HCL 100 MG PO TABS: 100.0000 mg | ORAL_TABLET | Freq: Every evening | ORAL | Status: DC | PRN
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
## 2020-09-17 NOTE — ED Notes (Signed)
Patient was given a Cup of [employer].

## 2020-09-17 NOTE — Progress Notes (Signed)
   09/17/20 2015  Psych Admission Type (Psych Patients Only)  Admission Status Voluntary  Psychosocial Assessment  Patient Complaints Appetite decrease;Hopelessness;Self-harm thoughts;Depression;Sadness  Eye Contact Fair  Facial Expression Sad;Sullen  Affect Depressed;Appropriate to circumstance  Speech Logical/coherent  Interaction Assertive  Motor Activity Other (Comment) (WDL)  Appearance/Hygiene Disheveled;Poor hygiene  Behavior Characteristics Cooperative;Appropriate to situation  Mood Depressed;Worthless, low self-esteem  Thought Process  Coherency WDL  Content Blaming self  Delusions None reported or observed  Perception Hallucinations  Hallucination Auditory;Command;Visual  Judgment Impaired  Confusion WDL  Danger to Self  Current suicidal ideation? Denies  Danger to Others  Danger to Others None reported or observed

## 2020-09-17 NOTE — Plan of Care (Signed)
  Problem: Education: Goal: Knowledge of [facility] Education information/materials will improve Outcome: Progressing Goal: Verbalization of understanding the information provided will improve Outcome: Progressing   

## 2020-09-17 NOTE — ED Notes (Signed)
TTS video interview in progress .  

## 2020-09-17 NOTE — Tx Team (Signed)
Initial Treatment Plan 09/17/2020 4:53 PM [name] HQI:[id number]    PATIENT STRESSORS: Financial difficulties Health problems Medication change or noncompliance Substance abuse   PATIENT STRENGTHS: Average or above average intelligence Communication skills General fund of knowledge Motivation for treatment/growth   PATIENT IDENTIFIED PROBLEMS: Suicide Risk  "I need to get back on my medications."  "Help getting off substances."  AV hallucinations               DISCHARGE CRITERIA:  Ability to meet basic life and health needs Improved stabilization in mood, thinking, and/or behavior Need for constant or close observation no longer present Reduction of life-threatening or endangering symptoms to within safe limits  PRELIMINARY DISCHARGE PLAN: Placement in alternative living arrangements  PATIENT/FAMILY INVOLVEMENT: This treatment plan has been presented to and reviewed with the patient, John [name].  The patient and family have been given the opportunity to ask questions and make suggestions.  Maudie Flakes, RN 09/17/2020, 4:53 PM

## 2020-09-17 NOTE — Progress Notes (Signed)
Pt accepted to [facility]   Patient meets inpatient criteria per Margorie John, PA-C   Dr.Amy Nelda Marseille is the attending provider.    Call report to [phone]    Trenton Founds, RN @ [facility] ED notified.     Pt scheduled  to arrive at [facility] today by [place], MSW, LCSW-A  1:02 PM 09/17/2020

## 2020-09-17 NOTE — BH Assessment (Signed)
Clinician checked in with Nolon Lennert, RN via secure chat in Epic to see if the pt is able to be assessed and placed in a private room. Clinician expressed her colleagues were able to assess other pts that were in the hallway (they were placed in a private room).   Clinician awaiting response.    Vertell Novak, [place], [facility], [facility] Triage Specialist [phone]
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
## 2020-09-17 NOTE — Progress Notes (Signed)
Patient information has been sent to [facility] via secure chat to review for potential admission. Patient meets inpatient criteria per Margorie John, PA-C.   Situation ongoing, CSW will continue to monitor progress.    Signed:  Mariea Clonts, MSW, LCSW-A  09/17/2020 9:46 AM

## 2020-09-18 DIAGNOSIS — F333 Major depressive disorder, recurrent, severe with psychotic symptoms: Principal | ICD-10-CM

## 2020-09-18 LAB — T4, FREE: Free T4: 0.69 ng/dL (ref 0.61–1.12)

## 2020-09-18 LAB — TSH: TSH: 5.149 u[IU]/mL — ABNORMAL HIGH (ref 0.350–4.500)

## 2020-09-18 LAB — RAPID URINE DRUG SCREEN, HOSP PERFORMED
Amphetamines: NOT DETECTED
Barbiturates: NOT DETECTED
Benzodiazepines: NOT DETECTED
Cocaine: POSITIVE — AB
Opiates: NOT DETECTED
Tetrahydrocannabinol: POSITIVE — AB

## 2020-09-18 LAB — LIPID PANEL
Cholesterol: 194 mg/dL (ref 0–200)
HDL: 83 mg/dL (ref >40)
LDL Cholesterol: 94 mg/dL (ref 0–99)
Total CHOL/HDL Ratio: 2.3 RATIO
Triglycerides: 86 mg/dL (ref <150)
VLDL: 17 mg/dL (ref 0–40)

## 2020-09-18 LAB — HEMOGLOBIN A1C
Hgb A1c MFr Bld: 5.2 % (ref 4.8–5.6)
Mean Plasma Glucose: 102.54 mg/dL

## 2020-09-18 MED ORDER — MIRTAZAPINE 15 MG PO TABS
15.0000 mg | ORAL_TABLET | Freq: Every day | ORAL | Status: DC
  Administered 2020-09-18 – 2020-09-22 (×5): 15 mg via ORAL
  Filled 2020-09-18 (×6): qty 1

## 2020-09-18 MED ORDER — ENSURE ENLIVE PO LIQD
237.0000 mL | Freq: Two times a day (BID) | ORAL | Status: DC
  Administered 2020-09-18 – 2020-09-23 (×9): 237 mL via ORAL
  Filled 2020-09-18 (×12): qty 237

## 2020-09-18 NOTE — Progress Notes (Signed)
Progress note  Pt found in bed. Pt stormed off back to their room because "they had to wait too long". Pt was angered more when they found their HIV medication to be wrong. Pt was compliant with medication when meds are available. Pt continues to be minimal and guarded with assessments/interactions. Pt is also reclusive but has gone to meal times. Pt still endorsing passive si with no plan. Pt still endorsing avh. Pt denies hi and verbally agrees to approach staff if these become apparent or before harming themselves/others while at [facility].  A: Pt provided support and encouragement. Pt given medication per protocol and standing orders. Q45m safety checks implemented and continued.  R: Pt safe on the unit. Will continue to monitor.

## 2020-09-18 NOTE — [facility] Group Notes (Signed)
[facility] Group Notes:  (Nursing/MHT/Case Management/Adjunct)  Date:  09/18/2020  Time:  9:43 AM  Type of Therapy:   Goals group  Participation Level:  Minimal  Participation Quality:  Inattentive  Affect:  Flat and Irritable  Cognitive:  Oriented  Insight:  Lacking  Engagement in Group:  Lacking  Modes of Intervention:  Discussion and Education  Summary of Progress/Problems:  John [name] reported his goal for today is "to get back on my meds as soon as possible."  He slept "terrible without meds."  [name] 09/18/2020, 9:43 AM
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
## 2020-09-18 NOTE — Progress Notes (Signed)
NUTRITION ASSESSMENT  Pt identified as at risk on the Malnutrition Screen Tool  INTERVENTION: 1. Supplements: Ensure Enlive po BID, each supplement provides 350 kcal and 20 grams of protein   NUTRITION DIAGNOSIS: Unintentional weight loss related to sub-optimal intake as evidenced by pt report.   Goal: Pt to meet >/= 90% of their estimated nutrition needs.  Monitor:  PO intake  Assessment:  Pt admitted for depression and polysubstance abuse (EtOH, cocaine, THC) Pt reports decreased appetite.  Per weight records, pt has lost 19 lbs since 1/27 (12% wt loss x 5.5 months, significant for time frame). Will order Ensure supplements.   Height: Ht Readings from Last 1 Encounters:  09/17/20 5\' 7"  (1.702 m)    Weight: Wt Readings from Last 1 Encounters:  09/17/20 63 kg    Weight Hx: Wt Readings from Last 10 Encounters:  09/17/20 63 kg  08/07/20 65.8 kg  08/01/20 65.3 kg  07/10/20 67.6 kg  05/22/20 68.9 kg  04/10/20 72.1 kg  02/18/20 66.8 kg  01/03/20 67.6 kg  12/19/19 59 kg  10/15/19 65.8 kg    BMI:  Body mass index is 21.77 kg/m. Pt meets criteria for normal based on current BMI.  Estimated Nutritional Needs: Kcal: 25-30 kcal/kg Protein: > 1 gram protein/kg Fluid: 1 ml/kcal  Diet Order:  Diet Order             Diet Heart Room service appropriate? Yes; Fluid consistency: Thin  Diet effective now                  Pt is also offered choice of unit snacks mid-morning and mid-afternoon.  Pt is eating as desired.   Lab results and medications reviewed.   Clayton Bibles, MS, RD, LDN Inpatient Clinical Dietitian Contact information available via Amion

## 2020-09-18 NOTE — H&P (Signed)
Psychiatric Admission Assessment Adult  Patient Identification: John [name] MRN:  [id number] Date of Evaluation:  09/18/2020 Chief Complaint:  MDD (major depressive disorder), single episode, severe ([place]) [F32.2] Principal Diagnosis: Depression, major, recurrent, severe with psychosis ([place]) Diagnosis:  Principal Problem:   Depression, major, recurrent, severe with psychosis ([place]) Active Problems:   Alcohol use disorder, severe, dependence ([place])   Cocaine use disorder, severe, dependence ([place])   Cannabis use disorder, moderate, dependence ([place])   Polysubstance abuse ([place])  History of Present Illness: Medical record reviewed.  Patient's case discussed in detail with members of the treatment team.  I met with and evaluated the patient on the unit today.  John [name] is a 50 year old homeless male with history significant for multiple inpatient psychiatric hospitalizations, remote history of suicide attempts and diagnoses of major depressive disorder recurrent versus bipolar disorder, PTSD and substance use disorder (alcohol, cannabis, cocaine) as well as HIV infection and asthma who presented to [facility] ED on 09/16/2020 via EMS after he called for assistance due to breathing difficulties/asthma exacerbation.  While being evaluated in the ED, the patient reported suicidal ideation and nonspecific thoughts of harming others and requested inpatient psychiatric admission for a couple weeks to get back on his medications.  In the ED, patient reported seeing and hearing little green men with horns telling him to kill himself.  BAL was <10.  UDS was not performed in the ED.  UDS performed this morning was positive for cocaine and THC.  Patient was transferred to [facility] for inpatient psychiatric treatment.  On interview with me this morning, the patient is reclining in bed.  He is cooperative, maintains good eye contact and is mildly irritable with constricted affect.  Thought processes are coherent and  goal-directed without evidence of formal thought disorder.  He does not appear to attend or respond to internal stimuli.  Patient reports that he has previously been diagnosed with "bipolar schizophrenia" and has not taken his meds for the past 2 to 3 months because it was too difficult to take pills (although he does report consistent adherence with HIV medications).  He reports worsening mood and suicidal ideation prior to admission with thoughts of jumping off a parking deck or bridge or running into traffic.  Patient states he had an asthma attack and after arriving at the hospital for breathing treatment he told ED staff of suicidality.  He continues to have passive wishes not to be alive but denies any active SI this morning in the hospital.  He has fleeting nonspecific thoughts of harming others but denies any intent, plan or target.  He reports intermittent AH and VH that consist of seeing and hearing "little blue men with horns or devils" that tell him to hurt or kill himself.  He endorses anxiety, depressed mood, irritability, insomnia, low energy, decreased interest and decreased appetite.  He denies PI.  He has been using marijuana daily "as much as I can get" prior to admission with most recent use on 09/15/2020.  He also reports daily use of cocaine and alcohol with most recent use on 09/15/2020.  He denies any other drug use.  Patient denies any current symptoms of alcohol withdrawal.  He denies any history of complicated alcohol withdrawal or seizures.  Patient reports poor appetite and poor sleep since admission.  Nursing notes document that he slept 6.75 hours last night.  Patient has been diagnosed with recurrent major depression versus bipolar 1 disorder, GAD, PTSD in the past.  He also reports  being diagnosed with "bipolar schizophrenia" in the past.  He has undergone multiple medication trials.  He first had psychiatric treatment at age 57.  Patient states that he has been admitted to  inpatient psychiatric hospitals on numerous occasions in the past and estimates more than 20 inpatient admission during his life.  Admissions typically occur for suicidal ideation.  The patient's most recent hospitalization at [facility] was from 03/23/2019 until 03/27/2019 for major depression with psychotic features and cannabis use.  Presentation was similar to the current admission.  During that admission patient ultimately confessed that he was malingering psychosis.  He was discharged on Remeron 15 mg nightly, buspirone 10 mg 3 times daily, trazodone 300 mg nightly and quetiapine 50 mg 3 times daily.  He has a history of at least 3 prior suicide attempts with the most recent attempt occurring in 1998 by cutting his wrists.  He denies any history of nonsuicidal self-injurious behavior.    Associated Signs/Symptoms: Depression Symptoms:  depressed mood, anhedonia, insomnia, fatigue, suicidal thoughts with specific plan, anxiety, decreased appetite, Duration of Depression Symptoms: Greater than two weeks  (Hypo) Manic Symptoms:  Hallucinations, Impulsivity, Irritable Mood, Anxiety Symptoms:   reports feeling anxious Psychotic Symptoms:  Hallucinations: Auditory Visual PTSD Symptoms: Negative Total Time spent with patient: 30 minutes  Past Psychiatric History: Patient has been diagnosed with recurrent major depression versus bipolar 1 disorder, GAD, PTSD in the past.  He also reports being diagnosed with "bipolar schizophrenia" in the past.  He has undergone multiple medication trials.  He first had psychiatric treatment at age 77.  Patient states that he has been admitted to inpatient psychiatric hospitals on numerous occasions in the past and estimates more than 20 inpatient admission during his life.  Admissions typically occur for suicidal ideation.  The patient's most recent hospitalization at [facility] was from 03/23/2019 until 03/27/2019 for major depression with psychotic features and cannabis  use.  Presentation was similar to the current admission.  During that admission patient ultimately confessed that he was malingering psychosis.  He was discharged on Remeron 15 mg nightly, buspirone 10 mg 3 times daily, trazodone 300 mg nightly and quetiapine 50 mg 3 times daily.  He has a history of at least 3 prior suicide attempts with the most recent attempt occurring in 1998 by cutting his wrists.  He denies any history of nonsuicidal self-injurious behavior.  Is the patient at risk to self? Yes.    Has the patient been a risk to self in the past 6 months? Yes.    Has the patient been a risk to self within the distant past? Yes.    Is the patient a risk to others? No.  Has the patient been a risk to others in the past 6 months? No.  Has the patient been a risk to others within the distant past? No.   Prior Inpatient Therapy:   Prior Outpatient Therapy:    Alcohol Screening: 1. How often do you have a drink containing alcohol?: 4 or more times a week 2. How many drinks containing alcohol do you have on a typical day when you are drinking?: 10 or more 3. How often do you have six or more drinks on one occasion?: Daily or almost daily AUDIT-C Score: 12 4. How often during the last year have you found that you were not able to stop drinking once you had started?: Daily or almost daily 5. How often during the last year have you failed to do what  was normally expected from you because of drinking?: Daily or almost daily 6. How often during the last year have you needed a first drink in the morning to get yourself going after a heavy drinking session?: Daily or almost daily 7. How often during the last year have you had a feeling of guilt of remorse after drinking?: Weekly 8. How often during the last year have you been unable to remember what happened the night before because you had been drinking?: Never 9. Have you or someone else been injured as a result of your drinking?: No 10. Has a  relative or friend or a doctor or another health worker been concerned about your drinking or suggested you cut down?: Yes, during the last year Alcohol Use Disorder Identification Test Final Score (AUDIT): 31 Alcohol Brief Interventions/Follow-up: Alcohol education/Brief advice (Pt wanting help with substance use.) Substance Abuse History in the last 12 months:  Yes.   Consequences of Substance Abuse: Substance use likely significantly contributing to mood and anxiety symptoms and hallucinations that precipitated current admission Previous Psychotropic Medications: Yes  Psychological Evaluations: Yes  Past Medical History:  Past Medical History:  Diagnosis Date   Alcohol abuse    Alcoholism ([place])    Anxiety    Asthma    Asthma    Bipolar disorder ([place])    Chronic low back pain    Cocaine abuse ([place])    Depression    Gout    Gout    HIV (human immunodeficiency virus infection) ([place])    "dx'd ~ 2 yr ago" (09/29/2012)   HIV (human immunodeficiency virus infection) ([place])    HIV disease ([place])    Homelessness    Hypertension    Marijuana abuse    Mental disorder    Schizophrenia ([place])     Past Surgical History:  Procedure Laterality Date   SKIN GRAFT FULL THICKNESS LEG Left ?   POSTERIOR LEFT LEG  AFTER DOG BITES   Family History:  Family History  Problem Relation Age of Onset   Alcoholism Mother    Depression Mother    Alcoholism Brother    Family Psychiatric  History: Patient reports that his brother had problems with alcohol.  He denies any other family history of alcohol or substance use issues, mental health diagnoses or suicide. Tobacco Screening:   Social History:  Social History   Substance and Sexual Activity  Alcohol Use Yes   Alcohol/week: 12.0 standard drinks   Types: 12 Cans of beer per week   Comment: Daily. "As much as I can get."     Social History   Substance and Sexual Activity  Drug Use Yes   Types: Marijuana, "Crack" cocaine   Comment: States  been drinking 4-40oz beers daily.    Additional Social History:                           Allergies:   Allergies  Allergen Reactions   Peanut-Containing Drug Products Anaphylaxis    Peanuts in general.   Shellfish Allergy Anaphylaxis and Swelling   Lab Results:  Results for orders placed or performed during the hospital encounter of 09/17/20 (from the past 48 hour(s))  Lipid panel     Status: None   Collection Time: 09/18/20  6:32 AM  Result Value Ref Range   Cholesterol 194 0 - 200 mg/dL   Triglycerides 86 <150 mg/dL   HDL 83 >40 mg/dL   Total CHOL/HDL Ratio  2.3 RATIO   VLDL 17 0 - 40 mg/dL   LDL Cholesterol 94 0 - 99 mg/dL    Comment:        Total Cholesterol/HDL:CHD Risk Coronary Heart Disease Risk Table                     Men   Women  1/2 Average Risk   3.4   3.3  Average Risk       5.0   4.4  2 X Average Risk   9.6   7.1  3 X Average Risk  23.4   11.0        Use the calculated Patient Ratio above and the CHD Risk Table to determine the patient's CHD Risk.        ATP III CLASSIFICATION (LDL):  <100     mg/dL   Optimal  100-129  mg/dL   Near or Above                    Optimal  130-159  mg/dL   Borderline  160-189  mg/dL   High  >190     mg/dL   Very High Performed at [facility] [address] [place], [place] [address]   TSH     Status: Abnormal   Collection Time: 09/18/20  6:32 AM  Result Value Ref Range   TSH 5.149 (H) 0.350 - 4.500 uIU/mL    Comment: Performed by a 3rd Generation assay with a functional sensitivity of <=0.01 uIU/mL. Performed at [facility], [place] [address] [place], [place] [address]   Hemoglobin A1c     Status: None   Collection Time: 09/18/20  6:32 AM  Result Value Ref Range   Hgb A1c MFr Bld 5.2 4.8 - 5.6 %    Comment: (NOTE) Pre diabetes:          5.7%-6.4%  Diabetes:              >6.4%  Glycemic control for   <7.0% adults with diabetes    Mean Plasma Glucose 102.54 mg/dL     Comment: Performed at [facility] [address] [place], [place] [address]  T4, free     Status: None   Collection Time: 09/18/20  6:32 AM  Result Value Ref Range   Free T4 0.69 0.61 - 1.12 ng/dL    Comment: (NOTE) Biotin ingestion may interfere with free T4 tests. If the results are inconsistent with the TSH level, previous test results, or the clinical presentation, then consider biotin interference. If needed, order repeat testing after stopping biotin. Performed at [facility], [place] [address] [place],  [address]   Rapid urine drug screen (hospital performed)     Status: Abnormal   Collection Time: 09/18/20  6:36 AM  Result Value Ref Range   Opiates NONE DETECTED NONE DETECTED   Cocaine POSITIVE (A) NONE DETECTED   Benzodiazepines NONE DETECTED NONE DETECTED   Amphetamines NONE DETECTED NONE DETECTED   Tetrahydrocannabinol POSITIVE (A) NONE DETECTED   Barbiturates NONE DETECTED NONE DETECTED    Comment: (NOTE) DRUG SCREEN FOR MEDICAL PURPOSES ONLY.  IF CONFIRMATION IS NEEDED FOR ANY PURPOSE, NOTIFY LAB WITHIN 5 DAYS.  LOWEST DETECTABLE LIMITS FOR URINE DRUG SCREEN Drug Class                     Cutoff (ng/mL) Amphetamine and metabolites    1000 Barbiturate  and metabolites    200 Benzodiazepine                 628 Tricyclics and metabolites     300 Opiates and metabolites        300 Cocaine and metabolites        300 THC                            50 Performed at [facility], [place] [address], [place], [place] [address]     Blood Alcohol level:  Lab Results  Component Value Date   ETH <10 09/16/2020   ETH <10 47/65/4650    Metabolic Disorder Labs:  Lab Results  Component Value Date   HGBA1C 5.2 09/18/2020   MPG 102.54 09/18/2020   MPG 96.8 03/24/2019   Lab Results  Component Value Date   PROLACTIN 6.7 09/29/2012   Lab Results  Component Value Date   CHOL 194 09/18/2020   TRIG 86 09/18/2020   HDL 83  09/18/2020   CHOLHDL 2.3 09/18/2020   VLDL 17 09/18/2020   LDLCALC 94 09/18/2020   LDLCALC 60 04/10/2020    Current Medications: Current Facility-Administered Medications  Medication Dose Route Frequency Provider Last Rate Last Admin   acetaminophen (TYLENOL) tablet 650 mg  650 mg Oral Q6H PRN Rankin, Shuvon B, NP       albuterol (VENTOLIN HFA) 108 (90 Base) MCG/ACT inhaler 2 puff  2 puff Inhalation Q4H PRN Arthor Captain, MD   2 puff at 09/18/20 0636   alum & mag hydroxide-simeth (MAALOX/MYLANTA) 200-200-20 MG/5ML suspension 30 mL  30 mL Oral Q4H PRN Rankin, Shuvon B, NP       Darunavir-Cobicisctat-Emtricitabine-Tenofovir Alafenamide (SYMTUZA) 800-150-200-10 MG TABS 1 tablet  1 tablet Oral Q breakfast Rankin, Shuvon B, NP       hydrOXYzine (ATARAX/VISTARIL) tablet 25 mg  25 mg Oral Q6H PRN Arthor Captain, MD       loperamide (IMODIUM) capsule 2-4 mg  2-4 mg Oral PRN Arthor Captain, MD       LORazepam (ATIVAN) tablet 1 mg  1 mg Oral Q6H PRN Arthor Captain, MD       magnesium hydroxide (MILK OF MAGNESIA) suspension 30 mL  30 mL Oral Daily PRN Rankin, Shuvon B, NP       mirtazapine (REMERON) tablet 15 mg  15 mg Oral QHS Arthor Captain, MD       multivitamin with minerals tablet 1 tablet  1 tablet Oral Daily Arthor Captain, MD       ondansetron (ZOFRAN-ODT) disintegrating tablet 4 mg  4 mg Oral Q6H PRN Arthor Captain, MD       QUEtiapine (SEROQUEL) tablet 100 mg  100 mg Oral QHS Rankin, Shuvon B, NP       thiamine (B-1) injection 100 mg  100 mg Intramuscular Once Arthor Captain, MD       thiamine tablet 100 mg  100 mg Oral Daily Arthor Captain, MD       traZODone (DESYREL) tablet 50 mg  50 mg Oral QHS PRN,MR X 1 Arthor Captain, MD       PTA Medications: Medications Prior to Admission  Medication Sig Dispense Refill Last Dose   albuterol (VENTOLIN HFA) 108 (90 Base) MCG/ACT inhaler INHALE 2 PUFFS INTO THE LUNGS EVERY 4 HOURS AS NEEDED FOR WHEEZING OR SHORTNESS OF BREATH 18 g 0  busPIRone (BUSPAR) 15 MG tablet TAKE 1 TABLET(15 MG) BY MOUTH TWICE DAILY 60 tablet 2    mirtazapine (REMERON) 30 MG tablet Take 1 tablet (30 mg total) by mouth at bedtime. 30 tablet 2    QUEtiapine (SEROQUEL) 100 MG tablet Take 1 tablet (100 mg total) by mouth 2 (two) times daily. 60 tablet 2    SYMTUZA 800-150-200-10 MG TABS Take 1 tablet by mouth every morning. 30 tablet 3    traZODone (DESYREL) 100 MG tablet Take 1 tablet (100 mg total) by mouth at bedtime. 30 tablet 2     Musculoskeletal: Strength & Muscle Tone: within normal limits Gait & Station: normal Patient leans: N/A            Psychiatric Specialty Exam:  Presentation  General Appearance: Appropriate for Environment  Eye Contact:Good  Speech:Clear and Coherent  Speech Volume:Normal  Handedness:Right   Mood and Affect  Mood:Depressed; Dysphoric; Irritable; Anxious  Affect:Congruent   Thought Process  Thought Processes:Coherent; Goal Directed  Duration of Psychotic Symptoms: No data recorded Past Diagnosis of Schizophrenia or Psychoactive disorder: No  Descriptions of Associations:Intact  Orientation:Full (Time, Place and Person)  Thought Content:Logical  Hallucinations:Hallucinations: Auditory; Visual Description of Auditory Hallucinations: Sees and hears "blue men/devils that tell me to hurt myself, kill myself"  Ideas of Reference:None  Suicidal Thoughts:Suicidal Thoughts: Yes, Passive (Had active SI prior to admission with thoughts of jumping off a bridge or parking deck.  Denies active SI now) SI Passive Intent and/or Plan: Without Intent; Without Plan  Homicidal Thoughts:Homicidal Thoughts: No   Sensorium  Memory:Immediate Good; Recent Good; Remote Good  Judgment:Fair  Insight:Fair   Executive Functions  Concentration:Good  Attention Span:Good  Recall:Good  Fund of Knowledge:Good  Language:Good   Psychomotor Activity  Psychomotor Activity:Psychomotor Activity:  Normal   Assets  Assets:Communication Skills; Desire for Improvement; Social Support   Sleep  Sleep:Sleep: Poor    Physical Exam: Physical Exam Vitals and nursing note reviewed. Constitutional:      General: He is not in acute distress.    Appearance: He is not diaphoretic. HENT:    Head: Normocephalic and atraumatic. Pulmonary:    Effort: Pulmonary effort is normal. Neurological:    General: No focal deficit present.    Mental Status: He is alert and oriented to person, place, and time.   ROS Constitutional:  Positive for malaise/fatigue. Negative for chills, diaphoresis and fever. HENT:  Negative for sore throat.   Respiratory:  Negative for cough and shortness of breath.   Cardiovascular:  Negative for chest pain and palpitations. Gastrointestinal:  Negative for constipation, diarrhea, nausea and vomiting. Musculoskeletal: Negative.   Skin: Negative.   Neurological:  Negative for dizziness, tremors, seizures and headaches. Psychiatric/Behavioral:  Positive for depression, hallucinations, substance abuse and suicidal ideas. The patient is nervous/anxious and has insomnia.   All other systems reviewed and are negative.  Blood pressure 110/87, pulse 77, temperature 97.8 F (36.6 C), temperature source Oral, resp. rate 16, height _0  (1.702 m), weight 63 kg, SpO2 100 %. Body mass index is 21.77 kg/m.  Treatment Plan Summary: Daily contact with patient to assess and evaluate symptoms and progress in treatment and Medication management  Observation Level/Precautions:  15 minute checks  Laboratory:  CBC Chemistry Profile HbAIC UDS Lipid panel, TFTs.  Available lab results reviewed.  CMP with glucose of 60 and otherwise WNL.  Lipid panel was WNL.  CBC and differential revealed MCV of 100.4, platelets of 405 and ANC of 700.  Hemoglobin A1c was 5.2.  TSH was 5.149.  Free T4 was 0.69.  Influenza A, influenza B and coronavirus testing were negative.  UDS was positive for  cocaine and THC.  BAL was <10.  No EKG was performed in the ED.    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    Psychotherapy: Participation in group therapy and therapeutic milieu  Medications:  Will restart Remeron 15 mg at bedtime, quetiapine at 100 mg at bedtime.  Trazodone 50 mg at bedtime PRN for insomnia and Atarax 21m Q6H PRN for anxiety.  Patient has been placed on CIWA protocol to cover for any possible alcohol withdrawal with PRN lorazepam for CIWA scores greater than 10 and comfort meds.  Albuterol inhaler has been continued for asthma.  Symtuza has been continued for HIV infection.  See MAR for additional details.  Consultations:    Discharge Concerns:    Estimated LOS: 3 to 5 days  Other:     Physician Treatment Plan for Primary Diagnosis: Depression, major, recurrent, severe with psychosis ([place] Long Term Goal(s): Improvement in symptoms so as ready for discharge  Short Term Goals: Ability to identify changes in lifestyle to reduce recurrence of condition will improve, Ability to verbalize feelings will improve, Ability to disclose and discuss suicidal ideas, Ability to demonstrate self-control will improve, Ability to identify and develop effective coping behaviors will improve, Ability to maintain clinical measurements within normal limits will improve, Compliance with prescribed medications will improve, and Ability to identify triggers associated with substance abuse/mental health issues will improve  Physician Treatment Plan for Secondary Diagnosis: Principal Problem:   Depression, major, recurrent, severe with psychosis ([place] Active Problems:   Alcohol use disorder, severe, dependence ([place]   Cocaine use disorder, severe, dependence ([place]   Cannabis use disorder, moderate, dependence ([place]   Polysubstance abuse ([place]  Long Term Goal(s): Improvement in symptoms so as ready for  discharge  Short Term Goals: Ability to identify changes in lifestyle to reduce recurrence of condition will improve, Ability to verbalize feelings will improve, Ability to disclose and discuss suicidal ideas, Ability to demonstrate self-control will improve, Ability to identify and develop effective coping behaviors will improve, Ability to maintain clinical measurements within normal limits will improve, Compliance with prescribed medications will improve, and Ability to identify triggers associated with substance abuse/mental health issues will improve  I certify that inpatient services furnished can reasonably be expected to improve the patient's condition.    MArthor Captain MD 7/7/202210:55 AM

## 2020-09-18 NOTE — Plan of Care (Signed)
  Problem: Education: Goal: Emotional status will improve Outcome: Progressing Goal: Mental status will improve Outcome: Progressing   
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
## 2020-09-19 NOTE — Tx Team (Signed)
Interdisciplinary Treatment and Diagnostic Plan Update  09/19/2020 Time of Session: 9:50am John [name] MRN: [id number]  Principal Diagnosis: Depression, major, recurrent, severe with psychosis ([place])  Secondary Diagnoses: Principal Problem:   Depression, major, recurrent, severe with psychosis ([place]) Active Problems:   Alcohol use disorder, severe, dependence ([place])   Cocaine use disorder, severe, dependence ([place])   Cannabis use disorder, moderate, dependence ([place])   Polysubstance abuse ([place])   Current Medications:  Current Facility-Administered Medications  Medication Dose Route Frequency Provider Last Rate Last Admin   acetaminophen (TYLENOL) tablet 650 mg  650 mg Oral Q6H PRN Rankin, Shuvon B, NP       albuterol (VENTOLIN HFA) 108 (90 Base) MCG/ACT inhaler 2 puff  2 puff Inhalation Q4H PRN Arthor Captain, MD   2 puff at 09/18/20 0636   alum & mag hydroxide-simeth (MAALOX/MYLANTA) 200-200-20 MG/5ML suspension 30 mL  30 mL Oral Q4H PRN Rankin, Shuvon B, NP       Darunavir-Cobicisctat-Emtricitabine-Tenofovir Alafenamide (SYMTUZA) 800-150-200-10 MG TABS 1 tablet  1 tablet Oral Q breakfast Rankin, Shuvon B, NP   1 tablet at 09/19/20 1003   feeding supplement (ENSURE ENLIVE / ENSURE PLUS) liquid 237 mL  237 mL Oral BID BM Nelda [place], Amy E, MD   237 mL at 09/19/20 1004   hydrOXYzine (ATARAX/VISTARIL) tablet 25 mg  25 mg Oral Q6H PRN Arthor Captain, MD       loperamide (IMODIUM) capsule 2-4 mg  2-4 mg Oral PRN Arthor Captain, MD       LORazepam (ATIVAN) tablet 1 mg  1 mg Oral Q6H PRN Arthor Captain, MD       magnesium hydroxide (MILK OF MAGNESIA) suspension 30 mL  30 mL Oral Daily PRN Rankin, Shuvon B, NP       mirtazapine (REMERON) tablet 15 mg  15 mg Oral QHS Arthor Captain, MD   15 mg at 09/18/20 2141   multivitamin with minerals tablet 1 tablet  1 tablet Oral Daily Arthor Captain, MD   1 tablet at 09/19/20 1003   ondansetron (ZOFRAN-ODT) disintegrating tablet 4 mg  4 mg Oral Q6H PRN  Arthor Captain, MD       QUEtiapine (SEROQUEL) tablet 100 mg  100 mg Oral QHS Rankin, Shuvon B, NP   100 mg at 09/18/20 2141   thiamine (B-1) injection 100 mg  100 mg Intramuscular Once Arthor Captain, MD       thiamine tablet 100 mg  100 mg Oral Daily Arthor Captain, MD   100 mg at 09/19/20 1003   traZODone (DESYREL) tablet 50 mg  50 mg Oral QHS PRN,MR X 1 Arthor Captain, MD       PTA Medications: Medications Prior to Admission  Medication Sig Dispense Refill Last Dose   albuterol (VENTOLIN HFA) 108 (90 Base) MCG/ACT inhaler INHALE 2 PUFFS INTO THE LUNGS EVERY 4 HOURS AS NEEDED FOR WHEEZING OR SHORTNESS OF BREATH 18 g 0    busPIRone (BUSPAR) 15 MG tablet TAKE 1 TABLET(15 MG) BY MOUTH TWICE DAILY 60 tablet 2    mirtazapine (REMERON) 30 MG tablet Take 1 tablet (30 mg total) by mouth at bedtime. 30 tablet 2    QUEtiapine (SEROQUEL) 100 MG tablet Take 1 tablet (100 mg total) by mouth 2 (two) times daily. 60 tablet 2    SYMTUZA 800-150-200-10 MG TABS Take 1 tablet by mouth every morning. 30 tablet 3    traZODone (DESYREL) 100 MG tablet Take 1  tablet (100 mg total) by mouth at bedtime. 30 tablet 2     Patient Stressors: Financial difficulties Health problems Medication change or noncompliance Substance abuse  Patient Strengths: Average or above average intelligence Communication skills General fund of knowledge Motivation for treatment/growth  Treatment Modalities: Medication Management, Group therapy, Case management,  1 to 1 session with clinician, Psychoeducation, Recreational therapy.   Physician Treatment Plan for Primary Diagnosis: Depression, major, recurrent, severe with psychosis ([place]) Long Term Goal(s): Improvement in symptoms so as ready for discharge   Short Term Goals: Ability to identify changes in lifestyle to reduce recurrence of condition will improve Ability to verbalize feelings will improve Ability to disclose and discuss suicidal ideas Ability to demonstrate  self-control will improve Ability to identify and develop effective coping behaviors will improve Ability to maintain clinical measurements within normal limits will improve Compliance with prescribed medications will improve Ability to identify triggers associated with substance abuse/mental health issues will improve  Medication Management: Evaluate patient's response, side effects, and tolerance of medication regimen.  Therapeutic Interventions: 1 to 1 sessions, Unit Group sessions and Medication administration.  Evaluation of Outcomes: Not Met  Physician Treatment Plan for Secondary Diagnosis: Principal Problem:   Depression, major, recurrent, severe with psychosis ([place]) Active Problems:   Alcohol use disorder, severe, dependence ([place])   Cocaine use disorder, severe, dependence ([place])   Cannabis use disorder, moderate, dependence ([place])   Polysubstance abuse ([place])  Long Term Goal(s): Improvement in symptoms so as ready for discharge   Short Term Goals: Ability to identify changes in lifestyle to reduce recurrence of condition will improve Ability to verbalize feelings will improve Ability to disclose and discuss suicidal ideas Ability to demonstrate self-control will improve Ability to identify and develop effective coping behaviors will improve Ability to maintain clinical measurements within normal limits will improve Compliance with prescribed medications will improve Ability to identify triggers associated with substance abuse/mental health issues will improve     Medication Management: Evaluate patient's response, side effects, and tolerance of medication regimen.  Therapeutic Interventions: 1 to 1 sessions, Unit Group sessions and Medication administration.  Evaluation of Outcomes: Not Met   RN Treatment Plan for Primary Diagnosis: Depression, major, recurrent, severe with psychosis ([place]) Long Term Goal(s): Knowledge of disease and therapeutic regimen to maintain health  will improve  Short Term Goals: Ability to remain free from injury will improve, Ability to verbalize frustration and anger appropriately will improve, Ability to demonstrate self-control, Ability to participate in decision making will improve, Ability to identify and develop effective coping behaviors will improve, and Compliance with prescribed medications will improve  Medication Management: RN will administer medications as ordered by provider, will assess and evaluate patient's response and provide education to patient for prescribed medication. RN will report any adverse and/or side effects to prescribing provider.  Therapeutic Interventions: 1 on 1 counseling sessions, Psychoeducation, Medication administration, Evaluate responses to treatment, Monitor vital signs and CBGs as ordered, Perform/monitor CIWA, COWS, AIMS and Fall Risk screenings as ordered, Perform wound care treatments as ordered.  Evaluation of Outcomes: Not Met   LCSW Treatment Plan for Primary Diagnosis: Depression, major, recurrent, severe with psychosis ([place]) Long Term Goal(s): Safe transition to appropriate next level of care at discharge, Engage patient in therapeutic group addressing interpersonal concerns.  Short Term Goals: Engage patient in aftercare planning with referrals and resources, Increase social support, Increase ability to appropriately verbalize feelings, Facilitate patient progression through stages of change regarding substance use diagnoses and concerns, Identify  triggers associated with mental health/substance abuse issues, and Increase skills for wellness and recovery  Therapeutic Interventions: Assess for all discharge needs, 1 to 1 time with Social worker, Explore available resources and support systems, Assess for adequacy in community support network, Educate family and significant other(s) on suicide prevention, Complete Psychosocial Assessment, Interpersonal group therapy.  Evaluation of  Outcomes: Not Met   Progress in Treatment: Attending groups: Yes. Participating in groups: No. Taking medication as prescribed: Yes. Toleration medication: Yes. Family/Significant other contact made: No, will contact:  case worker Patient understands diagnosis: Yes. Discussing patient identified problems/goals with staff: No. Medical problems stabilized or resolved: Yes. Denies suicidal/homicidal ideation: Yes. Issues/concerns per patient self-inventory: No.   New problem(s) identified: No, Describe:  none  New Short Term/Long Term Goal(s): detox, medication management for mood stabilization; elimination of SI thoughts; development of comprehensive mental wellness/sobriety plan  Patient Goals:  Did not attend  Discharge Plan or Barriers: Pt is currently homeless. Pt is to be offered resources for housing along with residential treatment options.  Reason for Continuation of Hospitalization: Aggression Anxiety Depression Hallucinations Medication stabilization  Estimated Length of Stay: 3-5 day  Attendees: Patient: Did not attend 09/19/2020   Physician:  09/19/2020   Nursing:  09/19/2020   RN Care Manager: 09/19/2020   Social Worker: Darletta Moll, LCSW 09/19/2020   Recreational Therapist:  09/19/2020   Other:  09/19/2020   Other:  09/19/2020   Other: 09/19/2020     Scribe for Treatment Team: Vassie Moselle, [place] 09/19/2020 10:25 AM

## 2020-09-19 NOTE — Progress Notes (Signed)
Recreation Therapy Notes  Date: 7.8.22 Time: 0930 Location: [address] Dayroom  Group Topic: Stress Management   Goal Area(s) Addresses:  Patient will actively participate in stress management techniques presented during session.  Patient will successfully identify benefit of practicing stress management post d/c.   Intervention: Guided exercise with ambient sound and script  Activity :Guided Imagery  LRT provided education, instruction, and demonstration on practice of visualization via guided imagery. Patient was asked to participate in the technique introduced during session. LRT also debriefed including topics of mindfulness, stress management and specific scenarios each patient could use these techniques. Patients were given suggestions of ways to access scripts post d/c and encouraged to explore Youtube and other apps available on smartphones, tablets, and computers.   Education:  Stress Management, Discharge Planning.   Education Outcome: Acknowledges education  Clinical Observations/Feedback:  Group did not occur due to goals group going over time.     [name], LRT/CTRS         Ria Comment, Barby Colvard A 09/19/2020 10:45 AM

## 2020-09-19 NOTE — [facility] Group Notes (Signed)
Type of Therapy and Topic:  Group Therapy - Healthy vs Unhealthy Coping Skills  Participation Level:  Active   Description of Group The focus of this group was to determine what unhealthy coping techniques typically are used by group members and what healthy coping techniques would be helpful in coping with various problems. Patients were guided in becoming aware of the differences between healthy and unhealthy coping techniques. Patients were asked to identify 2-3 healthy coping skills they would like to learn to use more effectively.  Therapeutic Goals Patients learned that coping is what human beings do all day long to deal with various situations in their lives Patients defined and discussed healthy vs unhealthy coping techniques Patients identified their preferred coping techniques and identified whether these were healthy or unhealthy Patients determined 2-3 healthy coping skills they would like to become more familiar with and use more often. Patients provided support and ideas to each other   Summary of Patient Progress:  Due to the acuity and complex discharge plans, group was not held. Patient was provided therapeutic worksheets and asked to meet with CSW as needed.

## 2020-09-19 NOTE — Progress Notes (Signed)
[facility] MD Progress Note  09/19/2020 12:34 PM John [name]  MRN:  [id number]  Subjective: John [name] reports, "I'm feeling a little dizzy. It started last night & I don't know why. That is is the reason I'm in bed right now. I'm still feeling very depressed & suicidal. I don't quite have any plans to hurt myself because I'm here. I feel safe here. I'm taking the medicines, but they have not kicked in yet. My depression right now is #9, anxiety #5. I'm hearing voices telling me that I'm worthless. I see little blue men hopping around. They are not talking".  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  While being evaluated in the ED, the patient reported suicidal ideation and nonspecific thoughts of harming others and requested inpatient psychiatric admission for a couple weeks to get back on his medications. Daily notes: John [name] is seen, chart reviewed. The chart findings discussed with the treatment team. He he is lying down in bed with his eyes closed during this follow-up assessment, but spontaneously opens his eyes during this evaluation. He presents alert, oriented & aware of situation. He says he has been up for breakfast this morning, has attending group sessions. However, start to feel dizzy & has to come back to his room to lie down. He says the dizziness started last night. A review of his vital signs showed: B/p 104/69, P 110, T 98.2, R 16 & Sat 100% in room air. He does not appear to be responding to any internal stimuli. John [name] continues to endorse symptoms of  depression & anxiety. He rates his depression #9 & anxiety #5. He continues to endorse passive SI, denies any plans or intent to hurt himself or others. He is endorsing AVH, the voices were telling that he is worthless & he is seeing some little blue men that are hopping around, but saying nothing. Although he is reporting all these symptoms, John [name]'s affect is good, not congruent with reported mood status. He is making a good eye contact. He does not seem to be in any apparent distress. He did state that he has started on the medications for his symptoms, but has not start working yet. He denies any HI, delusional thoughts or paranoia. He does not appear to be responding to any internal stimuli. There are no changes made on his current plan of care. He is in agreement top continue current plan of care as already in progress.  Principal Problem: Depression, major, recurrent, severe with psychosis ([place])  Diagnosis: Principal Problem:   Depression, major, recurrent, severe with psychosis ([place]) Active Problems:   Alcohol use disorder, severe, dependence ([place])   Cocaine use disorder, severe, dependence ([place])   Cannabis use disorder, moderate, dependence ([place])   Polysubstance abuse ([place])  Total Time spent with patient:  25 minutes  Past Psychiatric History: See H&P  Past Medical History:  Past Medical History:  Diagnosis Date   Alcohol abuse    Alcoholism ([place])    Anxiety    Asthma    Asthma    Bipolar disorder ([place])    Chronic low back pain    Cocaine abuse ([place])  Depression    Gout    Gout    HIV (human immunodeficiency virus infection) ([place])    "dx'd ~ 2 yr ago" (09/29/2012)   HIV (human immunodeficiency virus infection) ([place])    HIV disease ([place])    Homelessness    Hypertension    Marijuana abuse    Mental disorder    Schizophrenia ([place])     Past Surgical History:  Procedure Laterality Date   SKIN GRAFT FULL THICKNESS LEG Left ?   POSTERIOR LEFT LEG  AFTER DOG BITES    Family History:  Family History  Problem Relation Age of Onset   Alcoholism Mother    Depression Mother    Alcoholism Brother    Family Psychiatric  History: See H&P  Social History:  Social History   Substance and Sexual Activity  Alcohol Use Yes   Alcohol/week: 12.0 standard drinks   Types: 12 Cans of beer per week   Comment: Daily. "As much as I can get."     Social History   Substance and Sexual Activity  Drug Use Yes   Types: Marijuana, "Crack" cocaine   Comment: States been drinking 4-40oz beers daily.    Social History   Socioeconomic History   Marital status: Single    Spouse name: Not on file   Number of children: 0   Years of education: Not on file   Highest education level: 9th grade  Occupational History   Occupation: unemployed  Tobacco Use   Smoking status: Every Day    Packs/day: 0.50    Years: 27.00    Pack years: 13.50    Types: Cigarettes   Smokeless tobacco: Never  Vaping Use   Vaping Use: Never used  Substance and Sexual Activity   Alcohol use: Yes    Alcohol/week: 12.0 standard drinks    Types: 12 Cans of beer per week    Comment: Daily. "As much as I can get."   Drug use: Yes    Types: Marijuana, "Crack" cocaine    Comment: States been drinking 4-40oz beers daily.   Sexual activity: Not Currently    Partners: Male    Birth control/protection: Condom    Comment: given condoms 04/10/20  Other Topics Concern   Not on file  Social History Narrative   ** Merged History Encounter **       ** Merged History Encounter **       Social Determinants of Health   Financial Resource Strain: High Risk   Difficulty of Paying Living Expenses: Very hard  Food Insecurity: Landscape architect Present   Worried About Charity fundraiser in the Last Year: Sometimes true   Arboriculturist in the Last Year: Sometimes true  Transportation Needs: Public librarian (Medical): Yes   Lack of Transportation  (Non-Medical): Yes  Physical Activity: Inactive   Days of Exercise per Week: 0 days   Minutes of Exercise per Session: 0 min  Stress: Stress Concern Present   Feeling of Stress : Rather much  Social Connections: Moderately Isolated   Frequency of Communication with Friends and Family: Three times a week   Frequency of Social Gatherings with Friends and Family: Three times a week   Attends Religious Services: Never   Active Member of Clubs or Organizations: Yes   Attends Music therapist: More than 4 times per year   Marital Status: Never married   Additional Social History:    Sleep:  Good  Appetite:  Fair  Current Medications: Current Facility-Administered Medications  Medication Dose Route Frequency Provider Last Rate Last Admin   acetaminophen (TYLENOL) tablet 650 mg  650 mg Oral Q6H PRN Rankin, Shuvon B, NP       albuterol (VENTOLIN HFA) 108 (90 Base) MCG/ACT inhaler 2 puff  2 puff Inhalation Q4H PRN Arthor Captain, MD   2 puff at 09/18/20 0636   alum & mag hydroxide-simeth (MAALOX/MYLANTA) 200-200-20 MG/5ML suspension 30 mL  30 mL Oral Q4H PRN Rankin, Shuvon B, NP       Darunavir-Cobicisctat-Emtricitabine-Tenofovir Alafenamide (SYMTUZA) 800-150-200-10 MG TABS 1 tablet  1 tablet Oral Q breakfast Rankin, Shuvon B, NP   1 tablet at 09/19/20 1003   feeding supplement (ENSURE ENLIVE / ENSURE PLUS) liquid 237 mL  237 mL Oral BID BM Nelda Marseille, Amy E, MD   237 mL at 09/19/20 1004   hydrOXYzine (ATARAX/VISTARIL) tablet 25 mg  25 mg Oral Q6H PRN Arthor Captain, MD       loperamide (IMODIUM) capsule 2-4 mg  2-4 mg Oral PRN Arthor Captain, MD       LORazepam (ATIVAN) tablet 1 mg  1 mg Oral Q6H PRN Arthor Captain, MD       magnesium hydroxide (MILK OF MAGNESIA) suspension 30 mL  30 mL Oral Daily PRN Rankin, Shuvon B, NP       mirtazapine (REMERON) tablet 15 mg  15 mg Oral QHS Arthor Captain, MD   15 mg at 09/18/20 2141   multivitamin with minerals tablet 1 tablet  1 tablet  Oral Daily Arthor Captain, MD   1 tablet at 09/19/20 1003   ondansetron (ZOFRAN-ODT) disintegrating tablet 4 mg  4 mg Oral Q6H PRN Arthor Captain, MD       QUEtiapine (SEROQUEL) tablet 100 mg  100 mg Oral QHS Rankin, Shuvon B, NP   100 mg at 09/18/20 2141   thiamine (B-1) injection 100 mg  100 mg Intramuscular Once Arthor Captain, MD       thiamine tablet 100 mg  100 mg Oral Daily Arthor Captain, MD   100 mg at 09/19/20 1003   traZODone (DESYREL) tablet 50 mg  50 mg Oral QHS PRN,MR X 1 Arthor Captain, MD       Lab Results:  Results for orders placed or performed during the hospital encounter of 09/17/20 (from the past 48 hour(s))  Lipid panel     Status: None   Collection Time: 09/18/20  6:32 AM  Result Value Ref Range   Cholesterol 194 0 - 200 mg/dL   Triglycerides 86 <150 mg/dL   HDL 83 >40 mg/dL   Total CHOL/HDL Ratio 2.3 RATIO   VLDL 17 0 - 40 mg/dL   LDL Cholesterol 94 0 - 99 mg/dL    Comment:        Total Cholesterol/HDL:CHD Risk Coronary Heart Disease Risk Table                     Men   Women  1/2 Average Risk   3.4   3.3  Average Risk       5.0   4.4  2 X Average Risk   9.6   7.1  3 X Average Risk  23.4   11.0        Use the calculated Patient Ratio above and the CHD Risk Table to determine the patient's CHD Risk.  ATP III CLASSIFICATION (LDL):  <100     mg/dL   Optimal  100-129  mg/dL   Near or Above                    Optimal  130-159  mg/dL   Borderline  160-189  mg/dL   High  >190     mg/dL   Very High Performed at [facility] [address], [place], [place] [address]   TSH     Status: Abnormal   Collection Time: 09/18/20  6:32 AM  Result Value Ref Range   TSH 5.149 (H) 0.350 - 4.500 uIU/mL    Comment: Performed by a 3rd Generation assay with a functional sensitivity of <=0.01 uIU/mL. Performed at [facility], [place] [address], [place], [place] [address]   Hemoglobin A1c     Status: None   Collection Time:  09/18/20  6:32 AM  Result Value Ref Range   Hgb A1c MFr Bld 5.2 4.8 - 5.6 %    Comment: (NOTE) Pre diabetes:          5.7%-6.4%  Diabetes:              >6.4%  Glycemic control for   <7.0% adults with diabetes    Mean Plasma Glucose 102.54 mg/dL    Comment: Performed at [facility] [address], [place], [place] [address]  T4, free     Status: None   Collection Time: 09/18/20  6:32 AM  Result Value Ref Range   Free T4 0.69 0.61 - 1.12 ng/dL    Comment: (NOTE) Biotin ingestion may interfere with free T4 tests. If the results are inconsistent with the TSH level, previous test results, or the clinical presentation, then consider biotin interference. If needed, order repeat testing after stopping biotin. Performed at [facility], [place] [address] [place], [place] [address]   Rapid urine drug screen (hospital performed)     Status: Abnormal   Collection Time: 09/18/20  6:36 AM  Result Value Ref Range   Opiates NONE DETECTED NONE DETECTED   Cocaine POSITIVE (A) NONE DETECTED   Benzodiazepines NONE DETECTED NONE DETECTED   Amphetamines NONE DETECTED NONE DETECTED   Tetrahydrocannabinol POSITIVE (A) NONE DETECTED   Barbiturates NONE DETECTED NONE DETECTED    Comment: (NOTE) DRUG SCREEN FOR MEDICAL PURPOSES ONLY.  IF CONFIRMATION IS NEEDED FOR ANY PURPOSE, NOTIFY LAB WITHIN 5 DAYS.  LOWEST DETECTABLE LIMITS FOR URINE DRUG SCREEN Drug Class                     Cutoff (ng/mL) Amphetamine and metabolites    1000 Barbiturate and metabolites    200 Benzodiazepine                 673 Tricyclics and metabolites     300 Opiates and metabolites        300 Cocaine and metabolites        300 THC                            50 Performed at [facility], [place] [address] [place],  [address]    Blood Alcohol level:  Lab Results  Component Value Date   [facility] <10 09/16/2020   ETH <10 90/24/0973   Metabolic Disorder Labs: Lab Results  Component  Value Date   HGBA1C 5.2 09/18/2020  MPG 102.54 09/18/2020   MPG 96.8 03/24/2019   Lab Results  Component Value Date   PROLACTIN 6.7 09/29/2012   Lab Results  Component Value Date   CHOL 194 09/18/2020   TRIG 86 09/18/2020   HDL 83 09/18/2020   CHOLHDL 2.3 09/18/2020   VLDL 17 09/18/2020   LDLCALC 94 09/18/2020   LDLCALC 60 04/10/2020   Physical Findings: AIMS: Facial and Oral Movements Muscles of Facial Expression: None, normal Lips and Perioral Area: None, normal Jaw: None, normal Tongue: None, normal,Extremity Movements Upper (arms, wrists, hands, fingers): None, normal Lower (legs, knees, ankles, toes): None, normal, Trunk Movements Neck, shoulders, hips: None, normal, Overall Severity Severity of abnormal movements (highest score from questions above): None, normal Incapacitation due to abnormal movements: None, normal Patient's awareness of abnormal movements (rate only patient's report): No Awareness, Dental Status Current problems with teeth and/or dentures?: No Does patient usually wear dentures?: No  CIWA:  CIWA-Ar Total: 3 COWS:     Musculoskeletal: Strength & Muscle Tone: within normal limits Gait & Station: normal Patient leans: N/A  Psychiatric Specialty Exam:  Presentation  General Appearance: Appropriate for Environment  Eye Contact:Good  Speech:Normal Rate; Clear and Coherent  Speech Volume:Normal  Handedness:Right  Mood and Affect  Mood:Anxious; Depressed; Dysphoric (Rates depression #9. Scale (1-10))  Affect:Appropriate; Congruent  Thought Process  Thought Processes:Coherent; Goal Directed; Linear  Descriptions of Associations:Intact  Orientation:Full (Time, Place and Person)  Thought Content:Logical  History of Schizophrenia/Schizoaffective disorder:No  Duration of Psychotic Symptoms:No data recorded Hallucinations:Hallucinations: Auditory; Visual (Says the voices are telling him that he is worthless.) Description of Auditory  Hallucinations: Sees and hears "blue men/devils that tell me to hurt myself, kill myself"  Ideas of Reference:None  Suicidal Thoughts:Suicidal Thoughts: Yes, Passive (Had active SI prior to admission with thoughts of jumping off a bridge or parking deck.  Denies active SI now) SI Passive Intent and/or Plan: Without Intent; Without Plan; Without Means to Carry Out; Without Access to Means  Homicidal Thoughts:Homicidal Thoughts: No  Sensorium  Memory:Immediate Good; Recent Good; Remote Good  Judgment:Fair  Insight:Fair  Executive Functions  Concentration:Good  Attention Span:Good  Recall:Good  Fund of Knowledge:Good  Language:Good  Psychomotor Activity  Psychomotor Activity:Psychomotor Activity: Normal  Assets  Assets:Communication Skills; Desire for Improvement; Social Support; Resilience  Sleep  Sleep:Sleep: Good Number of Hours of Sleep: 6.5  Physical Exam: Physical Exam Vitals and nursing note reviewed.  HENT:     Head: Normocephalic.     Nose: Nose normal.     Mouth/Throat:     Pharynx: Oropharynx is clear.  Eyes:     Pupils: Pupils are equal, round, and reactive to light.  Cardiovascular:     Rate and Rhythm: Normal rate.     Pulses: Normal pulses.  Pulmonary:     Effort: Pulmonary effort is normal.  Genitourinary:    Comments: Deferred Musculoskeletal:        General: Normal range of motion.     Cervical back: Normal range of motion.  Skin:    General: Skin is warm and dry.  Neurological:     General: No focal deficit present.     Mental Status: He is alert and oriented to person, place, and time.   Review of Systems  Constitutional:  Negative for chills, diaphoresis, fever and malaise/fatigue.  HENT: Negative.    Eyes: Negative.   Respiratory:  Negative for cough, shortness of breath and wheezing.   Cardiovascular:  Negative for chest pain and palpitations.  Gastrointestinal:  Negative for abdominal pain, constipation, diarrhea, heartburn,  nausea and vomiting.  Genitourinary:  Negative for dysuria.  Musculoskeletal:  Negative for joint pain and myalgias.  Skin: Negative.   Neurological:  Positive for dizziness. Negative for tingling, tremors, sensory change, speech change, focal weakness, seizures, loss of consciousness, weakness and headaches.  Endo/Heme/Allergies:        Allergies: NKDA  Food allergy: Peanut, shellfish  Psychiatric/Behavioral:  Positive for depression (Rates #9), hallucinations (Reports hearing voices mocking him & seeing little blue men)), substance abuse (UDS (+) for cocaine & THC)) and suicidal ideas (Reports passive SI, denies any plans or intent at this time.). Negative for memory loss. The patient is nervous/anxious (Rates #5) and has insomnia.   Blood pressure 104/67, pulse (!) 110, temperature 98.2 F (36.8 C), temperature source Oral, resp. rate 16, height 5\' 7"  (1.702 m), weight 63 kg, SpO2 100 %. Body mass index is 21.77 kg/m.  Treatment Plan Summary: Daily contact with patient to assess and evaluate symptoms and progress in treatment and Medication management.   Continue inpatient hospitalization.  Will continue today 09/19/2020 plan as below except where it is noted.   Mood control.  Continue Seroquel 100 mg po Q bedtime.   Depression/insomnia.  Continue Mirtazapine 15 mg po Q bedtime.  Anxiety/CIWA.  Continue Lorazepam 1 mg po Q 6 hours prn for CIWA > 10. Continue Vistaril 25 mg po Q 6 hours prn.  Insomnia.  Continue Mirtazapine 15 mg po Q hs.  Continue Trazodone 50 mg po Q hs prn, may repeat x 1.  PRN medications.  Continue acetaminophen 650 mg po Q 6 hours prn for pain/fever. Continue Mylanta 30 ml po Q 4 hours prn for indigestion. Continue MOM 30 ml po daily prn for constipation.  Continue Zofran-ODT 4 mg po Q 6 hours prn for N/V. Continue albuterol inhaler 2 puffs Q 4 hours prn for SOB. Continue Imodium 2-4 mg po daily prn for diarrhea.  Other medical issues.  Continue  SYMTUZA 800-150-200-10 mg po daily for HIV. Continue Ensure Enlive/Ensure (=) 237 ml bid between meals. Continue Multivitamin 1 tablet po daily for vitamin supplementation. Continue Thiamine 100 mg po/IM daily for low thiamine.  Encourage group participation.  Discharge disposition plan ongoing.  Lindell Spar, NP, pmhnp, fnp-bc 09/19/2020, 12:34 PM

## 2020-09-19 NOTE — BHH Group Notes (Signed)
[facility] Group Notes:  (Nursing/MHT/Case Management/Adjunct)  Date:  09/19/2020  Time:  6:51 PM  Type of Therapy:   Goals and orientation group  Participation Level:  Minimal  Participation Quality:  Inattentive  Affect:  Blunted  Cognitive:  Appropriate  Insight:  Lacking  Engagement in Group:  Limited  Modes of Intervention:  Discussion and Education  Summary of Progress/Problems:  He reported that his goal for today is "stay on my medications."  He slept "good" last night.  [name] 09/19/2020, 6:51 PM

## 2020-09-19 NOTE — Plan of Care (Signed)
  Problem: Activity: Goal: Interest or engagement in activities will improve Outcome: Progressing Goal: Sleeping patterns will improve Outcome: Progressing   
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
## 2020-09-19 NOTE — Progress Notes (Signed)
Progress note  Pt found in bed; compliant with medication administration. Pt denies any physical complaints. Pt does express passive si and still seeing/hearing things. Pt continues to be isolative to their room and not program with peers. Pt denies hi and verbally agrees to approach staff if si/hi/ah/vh become apparent or before harming themselves/others while at [facility].  A: Pt provided support and encouragement. Pt given medication per protocol and standing orders. Q79m safety checks implemented and continued.  R: Pt safe on the unit. Will continue to monitor.

## 2020-09-19 NOTE — BHH Group Notes (Signed)
[facility] Group Notes:  (Nursing/MHT/Case Management/Adjunct)  Date:  09/19/2020  Time:  6:53 PM  Type of Therapy:  Nurse Education  Participation Level:  Did Not Attend  Participation Quality:     Affect:    Cognitive:    Insight:    Engagement in Group:    Modes of Intervention:  Discussion  Summary of Progress/Problems:  Did not attend despite staff invitation  [name] 09/19/2020, 6:53 PM

## 2020-09-19 NOTE — Progress Notes (Signed)
   09/18/20 2141  Psych Admission Type (Psych Patients Only)  Admission Status Voluntary  Psychosocial Assessment  Patient Complaints Agitation;Anxiety;Irritability  Eye Contact Fair  Facial Expression Anxious  Affect Anxious;Appropriate to circumstance  Speech Logical/coherent;Pressured  Interaction Assertive;Attention-seeking  Motor Activity Fidgety;Restless  Appearance/Hygiene Disheveled;Poor hygiene  Behavior Characteristics Cooperative;Unable to participate  Mood Depressed;Anxious  Thought Process  Coherency Concrete thinking  Content Blaming others  Delusions Somatic  Perception Hallucinations  Hallucination Auditory;Command;Visual  Judgment Poor  Confusion Mild  Danger to Self  Current suicidal ideation? Passive  Self-Injurious Behavior Some self-injurious ideation observed or expressed.  No lethal plan expressed   Agreement Not to Harm Self Yes  Description of Agreement Verbal contract  Danger to Others  Danger to Others None reported or observed

## 2020-09-20 NOTE — [facility] Group Notes (Signed)
[facility] Group Notes:  (Nursing/MHT/Case Management/Adjunct)   Date:  09/20/2020  Time:  7:39 PM   Type of Therapy:  Nurse Education   Participation Level:  Did Not Attend   Participation Quality:     Affect:     Cognitive:     Insight:     Engagement in Group:     Modes of Intervention:  Education   Summary of Progress/Problems:  Did not attend despite staff invitation.   [name] 09/20/2020, 7:39 PM

## 2020-09-20 NOTE — [facility] Group Notes (Signed)
LCSW Group Therapy Note 09/20/2020  11:15am-12:00pm  Type of Therapy and Topic:  Group Therapy: Anger and Commonalities  Participation Level:  Did Not Attend   Description of Group: In this group, patients initially shared an "unknown" fact about themselves and CSW led a discussion about the ways in which we have things in common without realizing it.  Patient then identified a recent time they became angry and how this yet again showed a way in which they had something in common with other patients  We discussed possible unhealthy reactions to anger and possible healthy reactions.  We also discussed possible underlying emotions that lead to the anger.  Commonalities among group members were pointed out throughout the entirety of group.  Therapeutic Goals: Patients were asked to share something about themselves and learned that they often have things in common with other people without knowing this Patients will remember their last incident of anger and how they reacted Patients will be able to identify their reaction as healthy or unhealthy, and identify possible reactions that would have been the opposite Patients will learn that anger itself is a secondary emotion and will think about their primary emotion at the time of their last incident of anger  Summary of Patient Progress:  The patient was invited to group but opted not to attend.  Therapeutic Modalities:   Cognitive Behavioral Therapy  Maretta Los, LCSW 09/20/2020  1:56 PM

## 2020-09-20 NOTE — [facility] Group Notes (Signed)
[facility] Group Notes:  (Nursing/MHT/Case Management/Adjunct)   Date:  09/20/2020  Time:  11:21 AM   Type of Therapy:   Goals and orientation group   Participation Level:  Did Not Attend   Participation Quality:     Affect:     Cognitive:     Insight:     Engagement in Group:     Modes of Intervention:     Summary of Progress/Problems:  Did not attend despite staff invitation.   [place] 09/20/2020, 11:21 AM

## 2020-09-20 NOTE — Progress Notes (Signed)
     09/19/20 2148  Psych Admission Type (Psych Patients Only)  Admission Status Voluntary  Psychosocial Assessment  Patient Complaints Depression;Agitation;Anxiety  Eye Contact Fair  Facial Expression Anxious  Affect Anxious;Preoccupied  Speech Logical/coherent  Interaction Assertive;Forwards little  Motor Activity Fidgety;Restless  Appearance/Hygiene Disheveled;Poor hygiene  Behavior Characteristics Cooperative;Appropriate to situation;Anxious  Mood Anxious;Pleasant  Thought Process  Coherency Concrete thinking  Content Blaming others  Delusions None reported or observed  Perception Hallucinations  Hallucination Auditory;Command;Visual  Judgment Limited  Confusion None  Danger to Self  Current suicidal ideation? Passive  Self-Injurious Behavior Some self-injurious ideation observed or expressed.  No lethal plan expressed   Agreement Not to Harm Self Yes  Description of Agreement Verbal contract  Danger to Others  Danger to Others None reported or observed

## 2020-09-20 NOTE — Plan of Care (Signed)
  Problem: Coping: Goal: Ability to demonstrate self-control will improve Outcome: Progressing   Problem: Medication: Goal: Compliance with prescribed medication regimen will improve Outcome: Progressing   Problem: Coping: Goal: Coping ability will improve Outcome: Not Progressing

## 2020-09-20 NOTE — Progress Notes (Addendum)
[facility] MD Progress Note  09/20/2020 10:58 AM John [name]  MRN:  [id number]  Subjective:  John [name] reported " I did not sleep well last night."  Evaluation: John [name] was seen and evaluated face-to-face.  Patient observed standing at nursing station interacting with peers.  Continues to endorse passive suicidal ideations.  He rates his depression 6 out of 10 with 10 being the worst.  He reports he has been off his medication for the past 2 to 3 weeks when he started experiencing worsening suicidal ideations with a plan to jump off the bridge.  He denied any new stressors.  However did not elaborate with psychosocial situation.  Patient presents very guarded.    States that he has plans to follow-up with his caseworker John [name] at discharge.  Patient was restarted on Seroquel and  Remeron for mood stabilization.  He reports taking and tolerating medications well.  NP will restart BuSpar 5 mg p.o. twice daily.  Patient was receptive to plan.  Reports a good appetite.  UDS + Cocain and THC. CSW to follow-up with outpatient resources for substances/abuse. Support, encouragement and  reassurance was provided.  Principal Problem: Depression, major, recurrent, severe with psychosis ([place]) Diagnosis: Principal Problem:   Depression, major, recurrent, severe with psychosis ([place]) Active Problems:   Alcohol use disorder, severe, dependence ([place])   Cocaine use disorder, severe, dependence ([place])   Cannabis use disorder, moderate, dependence ([place])   Polysubstance abuse ([place])  Total Time spent with patient: 15 minutes  Past Psychiatric History: Charted history with bipolar disorder, schizophrenia and substance abuse  Past Medical History:  Past Medical History:  Diagnosis Date   Alcohol abuse    Alcoholism ([place])    Anxiety    Asthma    Asthma    Bipolar disorder ([place])    Chronic low back pain    Cocaine abuse ([place])    Depression    Gout    Gout    HIV (human immunodeficiency virus infection) ([place])     "dx'd ~ 2 yr ago" (09/29/2012)   HIV (human immunodeficiency virus infection) ([place])    HIV disease ([place])    Homelessness    Hypertension    Marijuana abuse    Mental disorder    Schizophrenia ([place])     Past Surgical History:  Procedure Laterality Date   SKIN GRAFT FULL THICKNESS LEG Left ?   POSTERIOR LEFT LEG  AFTER DOG BITES   Family History:  Family History  Problem Relation Age of Onset   Alcoholism Mother    Depression Mother    Alcoholism Brother    Family Psychiatric  History:  Social History:  Social History   Substance and Sexual Activity  Alcohol Use Yes   Alcohol/week: 12.0 standard drinks   Types: 12 Cans of beer per week   Comment: Daily. "As much as I can get."     Social History   Substance and Sexual Activity  Drug Use Yes   Types: Marijuana, "Crack" cocaine   Comment: States been drinking 4-40oz beers daily.    Social History   Socioeconomic History   Marital status: Single    Spouse name: Not on file   Number of children: 0   Years of education: Not on file   Highest education level: 9th grade  Occupational History   Occupation: unemployed  Tobacco Use   Smoking status: Every Day    Packs/day: 0.50    Years: 27.00    Pack years: 13.50  Types: Cigarettes   Smokeless tobacco: Never  Vaping Use   Vaping Use: Never used  Substance and Sexual Activity   Alcohol use: Yes    Alcohol/week: 12.0 standard drinks    Types: 12 Cans of beer per week    Comment: Daily. "As much as I can get."   Drug use: Yes    Types: Marijuana, "Crack" cocaine    Comment: States been drinking 4-40oz beers daily.   Sexual activity: Not Currently    Partners: Male    Birth control/protection: Condom    Comment: given condoms 04/10/20  Other Topics Concern   Not on file  Social History Narrative   ** Merged History Encounter **       ** Merged History Encounter **       Social Determinants of Health   Financial Resource Strain: High Risk    Difficulty of Paying Living Expenses: Very hard  Food Insecurity: Landscape architect Present   Worried About Charity fundraiser in the Last Year: Sometimes true   Arboriculturist in the Last Year: Sometimes true  Transportation Needs: Public librarian (Medical): Yes   Lack of Transportation (Non-Medical): Yes  Physical Activity: Inactive   Days of Exercise per Week: 0 days   Minutes of Exercise per Session: 0 min  Stress: Stress Concern Present   Feeling of Stress : Rather much  Social Connections: Moderately Isolated   Frequency of Communication with Friends and Family: Three times a week   Frequency of Social Gatherings with Friends and Family: Three times a week   Attends Religious Services: Never   Active Member of Clubs or Organizations: Yes   Attends Music therapist: More than 4 times per year   Marital Status: Never married   Additional Social History:                         Sleep: Fair  Appetite:  Fair  Current Medications: Current Facility-Administered Medications  Medication Dose Route Frequency Provider Last Rate Last Admin   acetaminophen (TYLENOL) tablet 650 mg  650 mg Oral Q6H PRN Rankin, Shuvon B, NP   650 mg at 09/20/20 0939   albuterol (VENTOLIN HFA) 108 (90 Base) MCG/ACT inhaler 2 puff  2 puff Inhalation Q4H PRN Arthor Captain, MD   2 puff at 09/18/20 0636   alum & mag hydroxide-simeth (MAALOX/MYLANTA) 200-200-20 MG/5ML suspension 30 mL  30 mL Oral Q4H PRN Rankin, Shuvon B, NP       Darunavir-Cobicisctat-Emtricitabine-Tenofovir Alafenamide (SYMTUZA) 800-150-200-10 MG TABS 1 tablet  1 tablet Oral Q breakfast Rankin, Shuvon B, NP   1 tablet at 09/20/20 0900   feeding supplement (ENSURE ENLIVE / ENSURE PLUS) liquid 237 mL  237 mL Oral BID BM Singleton, Amy E, MD   237 mL at 09/20/20 0941   hydrOXYzine (ATARAX/VISTARIL) tablet 25 mg  25 mg Oral Q6H PRN Arthor Captain, MD       loperamide (IMODIUM) capsule  2-4 mg  2-4 mg Oral PRN Arthor Captain, MD       LORazepam (ATIVAN) tablet 1 mg  1 mg Oral Q6H PRN Arthor Captain, MD       magnesium hydroxide (MILK OF MAGNESIA) suspension 30 mL  30 mL Oral Daily PRN Rankin, Shuvon B, NP       mirtazapine (REMERON) tablet 15 mg  15 mg Oral QHS Arthor Captain, MD  15 mg at 09/19/20 2147   multivitamin with minerals tablet 1 tablet  1 tablet Oral Daily Arthor Captain, MD   1 tablet at 09/20/20 0900   ondansetron (ZOFRAN-ODT) disintegrating tablet 4 mg  4 mg Oral Q6H PRN Arthor Captain, MD       QUEtiapine (SEROQUEL) tablet 100 mg  100 mg Oral QHS Rankin, Shuvon B, NP   100 mg at 09/19/20 2148   thiamine (B-1) injection 100 mg  100 mg Intramuscular Once Arthor Captain, MD       thiamine tablet 100 mg  100 mg Oral Daily Arthor Captain, MD   100 mg at 09/20/20 0900   traZODone (DESYREL) tablet 50 mg  50 mg Oral QHS PRN,MR X 1 Arthor Captain, MD   50 mg at 09/19/20 2148    Lab Results: No results found for this or any previous visit (from the past 36 hour(s)).  Blood Alcohol level:  Lab Results  Component Value Date   ETH <10 09/16/2020   ETH <10 85/88/5027    Metabolic Disorder Labs: Lab Results  Component Value Date   HGBA1C 5.2 09/18/2020   MPG 102.54 09/18/2020   MPG 96.8 03/24/2019   Lab Results  Component Value Date   PROLACTIN 6.7 09/29/2012   Lab Results  Component Value Date   CHOL 194 09/18/2020   TRIG 86 09/18/2020   HDL 83 09/18/2020   CHOLHDL 2.3 09/18/2020   VLDL 17 09/18/2020   LDLCALC 94 09/18/2020   LDLCALC 60 04/10/2020    Physical Findings: AIMS: Facial and Oral Movements Muscles of Facial Expression: None, normal Lips and Perioral Area: None, normal Jaw: None, normal Tongue: None, normal,Extremity Movements Upper (arms, wrists, hands, fingers): None, normal Lower (legs, knees, ankles, toes): None, normal, Trunk Movements Neck, shoulders, hips: None, normal, Overall Severity Severity of abnormal movements  (highest score from questions above): None, normal Incapacitation due to abnormal movements: None, normal Patient's awareness of abnormal movements (rate only patient's report): No Awareness, Dental Status Current problems with teeth and/or dentures?: No Does patient usually wear dentures?: No  CIWA:  CIWA-Ar Total: 0 COWS:     Musculoskeletal: Strength & Muscle Tone: within normal limits Gait & Station: normal Patient leans: N/A  Psychiatric Specialty Exam:  Presentation  General Appearance: Appropriate for Environment  Eye Contact:Good  Speech:Normal Rate; Clear and Coherent  Speech Volume:Normal  Handedness:Right   Mood and Affect  Mood:Anxious; Depressed; Dysphoric (Rates depression #9. Scale (1-10))  Affect:Appropriate; Congruent   Thought Process  Thought Processes:Coherent; Goal Directed; Linear  Descriptions of Associations:Intact  Orientation:Full (Time, Place and Person)  Thought Content:Logical  History of Schizophrenia/Schizoaffective disorder:No  Duration of Psychotic Symptoms:No data recorded Hallucinations:No data recorded Ideas of Reference:None  Suicidal Thoughts:No data recorded Homicidal Thoughts:No data recorded  Sensorium  Memory:Immediate Good; Recent Good; Remote Good  Judgment:Fair  Insight:Fair   Executive Functions  Concentration:Good  Attention Span:Good  Roy of Knowledge:Good  Language:Good   Psychomotor Activity  Psychomotor Activity: No data recorded  Assets  Assets:Communication Skills; Desire for Improvement; Social Support; Resilience   Sleep  Sleep: No data recorded   Physical Exam: Physical Exam Vitals reviewed.  Neurological:     Mental Status: He is alert.  Psychiatric:        Attention and Perception: Attention normal.        Mood and Affect: Mood normal.        Speech: Speech normal.  Behavior: Behavior normal.        Thought Content: Thought content normal.         Cognition and Memory: Cognition normal.        Judgment: Judgment normal.   Review of Systems  HENT: Negative.    Eyes: Negative.   Respiratory: Negative.    Cardiovascular: Negative.   Gastrointestinal: Negative.   Neurological: Negative.   Psychiatric/Behavioral:  Positive for depression and suicidal ideas. The patient is nervous/anxious.   All other systems reviewed and are negative. Blood pressure 106/86, pulse (!) 108, temperature 97.7 F (36.5 C), temperature source Oral, resp. rate 16, height 5\' 7"  (1.702 m), weight 63 kg, SpO2 100 %. Body mass index is 21.77 kg/m.   Treatment Plan Summary: Daily contact with patient to assess and evaluate symptoms and progress in treatment and Medication management  Continue with current treatment plan on 09/20/2020 as listed below except were noted  Major depressive disorder with psychosis: Continue Seroquel 100 mg p.o. nightly Continue Remeron 15 mg p.o. nightly Restarted BuSpar 5 mg p.o. twice daily  AntiInfetive medicaitons:  Continue Symtuza 800mg -150mg -200 mg and 10 mg daily  CSW to continue working on discharge disposition Patient encouraged to participate within the therapeutic milieu   D[place]ill [place], NP 09/20/2020, 10:58 AM

## 2020-09-20 NOTE — Progress Notes (Addendum)
   09/20/20 1000  Psych Admission Type (Psych Patients Only)  Admission Status Voluntary  Psychosocial Assessment  Patient Complaints Depression;Anxiety  Eye Contact Fair  Facial Expression Anxious  Affect Anxious;Preoccupied  Speech Logical/coherent  Interaction Assertive  Motor Activity Fidgety  Appearance/Hygiene Disheveled  Behavior Characteristics Cooperative;Appropriate to situation  Mood Anxious;Pleasant  Aggressive Behavior  Effect No apparent injury  Thought Process  Coherency Concrete thinking  Content Blaming others  Delusions None reported or observed  Perception Hallucinations  Hallucination None reported or observed  Judgment Limited  Confusion None  Danger to Self  Current suicidal ideation? Passive  Self-Injurious Behavior Some self-injurious ideation observed or expressed.  No lethal plan expressed   Agreement Not to Harm Self Yes  Description of Agreement Verbal contract  Danger to Others  Danger to Others None reported or observed    D. Pt presents with a sad affect/ depressed mood- calm,cooperative, but somewhat guarded behavior. Pt endorses some passive SI with no plan- and contracts verbally for safety. Pt observed in the milieu at times, but did not attend groups this am, despite encouragement from staff. .  A. Labs and vitals monitored. Pt compliant with  medications. Pt supported emotionally and encouraged to express concerns and ask questions.   R. Pt remains safe with 15 minute checks. Will continue POC.

## 2020-09-20 NOTE — [facility] Group Notes (Signed)
[facility] Group Notes:  (Nursing/MHT/Case Management/Adjunct)  Date:  09/20/2020  Time:  3:58 PM  Type of Therapy:  Nurse Education  Participation Level:  Active  Participation Quality:  Appropriate and Attentive  Affect:  Depressed  Cognitive:  Alert and Appropriate  Insight:  Improving  Engagement in Group:  Engaged  Modes of Intervention:  Activity  Summary of Progress/Problems:  Activity discussing listening and getting to know "you" activity with peers.  He had good participation with the group activity.  Barbette Or Cordero [place] 09/20/2020, 3:58 PM

## 2020-09-20 NOTE — Progress Notes (Signed)
Pt attended wrap up group but did not participate.

## 2020-09-21 MED ORDER — BUSPIRONE HCL 5 MG PO TABS
5.0000 mg | ORAL_TABLET | Freq: Two times a day (BID) | ORAL | Status: DC
  Administered 2020-09-21 – 2020-09-23 (×5): 5 mg via ORAL
  Filled 2020-09-21 (×8): qty 1

## 2020-09-21 NOTE — Progress Notes (Signed)
     09/20/20 2131  Psych Admission Type (Psych Patients Only)  Admission Status Voluntary  Psychosocial Assessment  Patient Complaints Anxiety  Eye Contact Fair  Facial Expression Anxious  Affect Anxious;Preoccupied  Speech Logical/coherent  Interaction Assertive  Motor Activity Fidgety;Restless  Appearance/Hygiene Disheveled  Behavior Characteristics Cooperative;Appropriate to situation  Mood Anxious;Pleasant  Thought Process  Coherency Concrete thinking  Content Blaming others  Delusions None reported or observed  Perception Hallucinations  Hallucination None reported or observed  Judgment Limited  Confusion None  Danger to Self  Current suicidal ideation? Passive  Self-Injurious Behavior Some self-injurious ideation observed or expressed.  No lethal plan expressed   Agreement Not to Harm Self Yes  Description of Agreement Verbal contract  Danger to Others  Danger to Others None reported or observed

## 2020-09-21 NOTE — Plan of Care (Signed)
  Problem: Education: Goal: Emotional status will improve Outcome: Progressing   Problem: Coping: Goal: Ability to verbalize frustrations and anger appropriately will improve Outcome: Progressing   Problem: Coping: Goal: Coping ability will improve Outcome: Progressing   Problem: Medication: Goal: Compliance with prescribed medication regimen will improve Outcome: Progressing

## 2020-09-21 NOTE — Progress Notes (Addendum)
[facility] MD Progress Note  09/21/2020 11:09 AM John [name]  MRN:  [id number]  Subjective:   John [name] reported " I was feeling dizzy probably because of having eaten this morning."  Evaluation: Patient observed resting in bed.  Denying suicidal or homicidal ideations.  Denies auditory or visual hallucinations during this assessment.  Patient appears to be in better spirits during this assessment compared to yesterday.  Reports he has been attending group sessions.  Stated he has been taking Seroquel and Remeron as indicated.  Denying medication side effects.  Denied cravings or withdrawal symptoms for alcohol.    Education provided with rising slow and sitting on the side of the bed notify staff if need assistance.  Continue to increase hydration.  Patient was receptive to plan.  UDS positive for cocaine and marijuana.  TSH 5.149, glucose 60.   [name] reports his anxiety 8-1/2 out of 10 with 10 being the worst.  Patient was restarted on BuSpar 5 mg p.o. twice daily.  Consider titration on 09/22/2020. John [name] reports a good appetite.  States he is resting well throughout the night.  Support,encouragement and reassurance was provided.  Principal Problem: Depression, major, recurrent, severe with psychosis ([place]) Diagnosis: Principal Problem:   Depression, major, recurrent, severe with psychosis ([place]) Active Problems:   Alcohol use disorder, severe, dependence ([place])   Cocaine use disorder, severe, dependence ([place])   Cannabis use disorder, moderate, dependence ([place])   Polysubstance abuse ([place])  Total Time spent with patient: 15 minutes  Past Psychiatric History:   Past Medical History:  Past Medical History:  Diagnosis Date   Alcohol abuse    Alcoholism ([place])    Anxiety    Asthma    Asthma    Bipolar disorder ([place])    Chronic low back pain    Cocaine abuse ([place])    Depression    Gout    Gout    HIV (human immunodeficiency virus infection) ([employer])    "dx'd ~ 2 yr ago" (09/29/2012)   HIV  (human immunodeficiency virus infection) ([place])    HIV disease ([place])    Homelessness    Hypertension    Marijuana abuse    Mental disorder    Schizophrenia ([place])     Past Surgical History:  Procedure Laterality Date   SKIN GRAFT FULL THICKNESS LEG Left ?   POSTERIOR LEFT LEG  AFTER DOG BITES   Family History:  Family History  Problem Relation Age of Onset   Alcoholism Mother    Depression Mother    Alcoholism Brother    Family Psychiatric  History:  Social History:  Social History   Substance and Sexual Activity  Alcohol Use Yes   Alcohol/week: 12.0 standard drinks   Types: 12 Cans of beer per week   Comment: Daily. "As much as I can get."     Social History   Substance and Sexual Activity  Drug Use Yes   Types: Marijuana, "Crack" cocaine   Comment: States been drinking 4-40oz beers daily.    Social History   Socioeconomic History   Marital status: Single    Spouse name: Not on file   Number of children: 0   Years of education: Not on file   Highest education level: 9th grade  Occupational History   Occupation: unemployed  Tobacco Use   Smoking status: Every Day    Packs/day: 0.50    Years: 27.00    Pack years: 13.50    Types: Cigarettes   Smokeless tobacco: Never  Vaping Use   Vaping Use: Never used  Substance and Sexual Activity   Alcohol use: Yes    Alcohol/week: 12.0 standard drinks    Types: 12 Cans of beer per week    Comment: Daily. "As much as I can get."   Drug use: Yes    Types: Marijuana, "Crack" cocaine    Comment: States been drinking 4-40oz beers daily.   Sexual activity: Not Currently    Partners: Male    Birth control/protection: Condom    Comment: given condoms 04/10/20  Other Topics Concern   Not on file  Social History Narrative   ** Merged History Encounter **       ** Merged History Encounter **       Social Determinants of Health   Financial Resource Strain: High Risk   Difficulty of Paying Living Expenses: Very hard   Food Insecurity: Landscape architect Present   Worried About Charity fundraiser in the Last Year: Sometimes true   Arboriculturist in the Last Year: Sometimes true  Transportation Needs: Public librarian (Medical): Yes   Lack of Transportation (Non-Medical): Yes  Physical Activity: Inactive   Days of Exercise per Week: 0 days   Minutes of Exercise per Session: 0 min  Stress: Stress Concern Present   Feeling of Stress : Rather much  Social Connections: Moderately Isolated   Frequency of Communication with Friends and Family: Three times a week   Frequency of Social Gatherings with Friends and Family: Three times a week   Attends Religious Services: Never   Active Member of Clubs or Organizations: Yes   Attends Music therapist: More than 4 times per year   Marital Status: Never married   Additional Social History:                         Sleep: Fair  Appetite:  Fair  Current Medications: Current Facility-Administered Medications  Medication Dose Route Frequency Provider Last Rate Last Admin   acetaminophen (TYLENOL) tablet 650 mg  650 mg Oral Q6H PRN Rankin, Shuvon B, NP   650 mg at 09/20/20 0939   albuterol (VENTOLIN HFA) 108 (90 Base) MCG/ACT inhaler 2 puff  2 puff Inhalation Q4H PRN Arthor Captain, MD   2 puff at 09/18/20 0636   alum & mag hydroxide-simeth (MAALOX/MYLANTA) 200-200-20 MG/5ML suspension 30 mL  30 mL Oral Q4H PRN Rankin, Shuvon B, NP       busPIRone (BUSPAR) tablet 5 mg  5 mg Oral BID Derrill Center, NP   5 mg at 09/21/20 1013   Darunavir-Cobicisctat-Emtricitabine-Tenofovir Alafenamide (SYMTUZA) 800-150-200-10 MG TABS 1 tablet  1 tablet Oral Q breakfast Rankin, Shuvon B, NP   1 tablet at 09/21/20 0815   feeding supplement (ENSURE ENLIVE / ENSURE PLUS) liquid 237 mL  237 mL Oral BID BM Singleton, Amy E, MD   237 mL at 09/21/20 1013   magnesium hydroxide (MILK OF MAGNESIA) suspension 30 mL  30 mL Oral Daily  PRN Rankin, Shuvon B, NP       mirtazapine (REMERON) tablet 15 mg  15 mg Oral QHS Arthor Captain, MD   15 mg at 09/20/20 2132   multivitamin with minerals tablet 1 tablet  1 tablet Oral Daily Arthor Captain, MD   1 tablet at 09/21/20 0815   QUEtiapine (SEROQUEL) tablet 100 mg  100 mg Oral QHS Rankin, Shuvon B, NP  100 mg at 09/20/20 2132   thiamine (B-1) injection 100 mg  100 mg Intramuscular Once Arthor Captain, MD       thiamine tablet 100 mg  100 mg Oral Daily Arthor Captain, MD   100 mg at 09/21/20 0815   traZODone (DESYREL) tablet 50 mg  50 mg Oral QHS PRN,MR X 1 Arthor Captain, MD   50 mg at 09/20/20 2132    Lab Results: No results found for this or any previous visit (from the past 5 hour(s)).  Blood Alcohol level:  Lab Results  Component Value Date   ETH <10 09/16/2020   ETH <10 98/92/1194    Metabolic Disorder Labs: Lab Results  Component Value Date   HGBA1C 5.2 09/18/2020   MPG 102.54 09/18/2020   MPG 96.8 03/24/2019   Lab Results  Component Value Date   PROLACTIN 6.7 09/29/2012   Lab Results  Component Value Date   CHOL 194 09/18/2020   TRIG 86 09/18/2020   HDL 83 09/18/2020   CHOLHDL 2.3 09/18/2020   VLDL 17 09/18/2020   LDLCALC 94 09/18/2020   LDLCALC 60 04/10/2020    Physical Findings: AIMS: Facial and Oral Movements Muscles of Facial Expression: None, normal Lips and Perioral Area: None, normal Jaw: None, normal Tongue: None, normal,Extremity Movements Upper (arms, wrists, hands, fingers): None, normal Lower (legs, knees, ankles, toes): None, normal, Trunk Movements Neck, shoulders, hips: None, normal, Overall Severity Severity of abnormal movements (highest score from questions above): None, normal Incapacitation due to abnormal movements: None, normal Patient's awareness of abnormal movements (rate only patient's report): No Awareness, Dental Status Current problems with teeth and/or dentures?: No Does patient usually wear dentures?: No   CIWA:  CIWA-Ar Total: 1 COWS:     Musculoskeletal: Strength & Muscle Tone: within normal limits Gait & Station: normal Patient leans: N/A  Psychiatric Specialty Exam:  Presentation  General Appearance: Appropriate for Environment  Eye Contact:Good  Speech:Normal Rate; Clear and Coherent  Speech Volume:Normal  Handedness:Right   Mood and Affect  Mood:Anxious; Depressed; Dysphoric (Rates depression #9. Scale (1-10))  Affect:Appropriate; Congruent   Thought Process  Thought Processes:Coherent; Goal Directed; Linear  Descriptions of Associations:Intact  Orientation:Full (Time, Place and Person)  Thought Content:Logical  History of Schizophrenia/Schizoaffective disorder:No  Duration of Psychotic Symptoms:No data recorded Hallucinations:No data recorded Ideas of Reference:None  Suicidal Thoughts:No data recorded Homicidal Thoughts:No data recorded  Sensorium  Memory:Immediate Good; Recent Good; Remote Good  Judgment:Fair  Insight:Fair   Executive Functions  Concentration:Good  Attention Span:Good  [place] of Knowledge:Good  Language:Good   Psychomotor Activity  Psychomotor Activity: No data recorded  Assets  Assets:Communication Skills; Desire for Improvement; Social Support; Resilience   Sleep  Sleep: No data recorded   Physical Exam: Physical Exam Vitals reviewed.  Cardiovascular:     Rate and Rhythm: Normal rate.  Pulmonary:     Effort: Pulmonary effort is normal.  Psychiatric:        Attention and Perception: Attention normal.        Mood and Affect: Mood normal.        Speech: Speech normal.        Behavior: Behavior normal.        Cognition and Memory: Cognition normal.        Judgment: Judgment normal.   Review of Systems  Respiratory: Negative.    Cardiovascular: Negative.   Gastrointestinal: Negative.   Neurological:  Positive for dizziness.  Endo/Heme/Allergies: Negative.   Psychiatric/Behavioral:  Positive for depression. Negative for hallucinations and suicidal ideas. The patient is nervous/anxious.   All other systems reviewed and are negative. Blood pressure (!) 113/92, pulse (!) 107, temperature 97.9 F (36.6 C), temperature source Oral, resp. rate 18, height 5\' 7"  (1.702 m), weight 63 kg, SpO2 100 %. Body mass index is 21.77 kg/m.   Treatment Plan Summary: Daily contact with patient to assess and evaluate symptoms and progress in treatment and Medication management   Continue with current treatment plan on 09/21/2020 as listed below except were noted   Major depressive disorder with psychosis: Continue Seroquel 100 mg p.o. nightly Continue Remeron 15 mg p.o. nightly Continue BuSpar 5 mg p.o. twice daily   AntiInfetive medicaitons:   Continue Symtuza 800mg -150mg -200 mg and 10 mg daily  Labs: TSH 5.149, T4 free 0.69 T3- pending results. CMP: CBC   CSW to continue working on discharge disposition Patient encouraged to participate within the therapeutic milieu  Derrill Center, NP 09/21/2020, 11:09 AM

## 2020-09-21 NOTE — [facility] Group Notes (Signed)
LCSW Group Therapy Note  Type of Therapy and Topic:  Group Therapy - Healthy vs Unhealthy Coping Skills  Participation Level:  Did Not Attend  Description of Group The focus of this group was to determine what unhealthy coping techniques typically are used by group members and what healthy coping techniques would be helpful in coping with various problems. Patients were guided in becoming aware of the differences between healthy and unhealthy coping techniques. Patients were asked to identify 2-3 healthy coping skills they would like to learn to use more effectively.  Therapeutic Goals 1. Patients learned that coping is what human beings do all day long to deal with various situations in their lives 2. Patients defined and discussed healthy vs unhealthy coping techniques 3. Patients identified their preferred coping techniques and identified whether these were healthy or unhealthy 4. Patients determined 2-3 healthy coping skills they would like to become more familiar with and use more often. 5. Patients provided support and ideas to each other   Therapeutic Modalities Cognitive [place], [place] 09/21/2020  11:03 AM

## 2020-09-21 NOTE — Plan of Care (Signed)
Cooperative and and calm. Denied SI/HI/AVH. Pleasant on approach.  Was seen in the dayroom with peers then went to bed.. Had no concerns.

## 2020-09-21 NOTE — Progress Notes (Signed)
   09/21/20 0815  Psych Admission Type (Psych Patients Only)  Admission Status Voluntary  Psychosocial Assessment  Patient Complaints Sleep disturbance  Eye Contact Brief  Facial Expression Anxious  Affect Anxious;Preoccupied  Speech Logical/coherent  Interaction Minimal  Motor Activity Fidgety  Appearance/Hygiene Disheveled  Behavior Characteristics Cooperative  Mood Anxious;Pleasant  Thought Process  Coherency Concrete thinking  Content Blaming others  Delusions None reported or observed  Perception WDL  Hallucination None reported or observed  Judgment Poor  Confusion None  Danger to Self  Current suicidal ideation? Denies  Self-Injurious Behavior No self-injurious ideation or behavior indicators observed or expressed   Agreement Not to Harm Self Yes  Description of Agreement Verbal contract  Danger to Others  Danger to Others None reported or observed      NOVEL CORONAVIRUS (COVID-19) DAILY CHECK-OFF SYMPTOMS - answer yes or no to each - every day NO YES  Have you had a fever in the past 24 hours?  Fever (Temp > 37.80C / 100F) X    Have you had any of these symptoms in the past 24 hours? New Cough  Sore Throat   Shortness of Breath  Difficulty Breathing  Unexplained Body Aches   X    Have you had any one of these symptoms in the past 24 hours not related to allergies?   Runny Nose  Nasal Congestion  Sneezing   X    If you have had runny nose, nasal congestion, sneezing in the past 24 hours, has it worsened?   X    EXPOSURES - check yes or no X    Have you traveled outside the state in the past 14 days?   X    Have you been in contact with someone with a confirmed diagnosis of COVID-19 or PUI in the past 14 days without wearing appropriate PPE?   X    Have you been living in the same home as a person with confirmed diagnosis of COVID-19 or a PUI (household contact)?     X    Have you been diagnosed with COVID-19?     X                                                                                                                              What to do next: Answered NO to all: Answered YES to anything:    Proceed with unit schedule Follow the [facility] Inpatient Flowsheet.

## 2020-09-21 NOTE — Progress Notes (Signed)
Pt did not attend group. 

## 2020-09-22 MED ORDER — IBUPROFEN 400 MG PO TABS
400.0000 mg | ORAL_TABLET | Freq: Three times a day (TID) | ORAL | Status: DC | PRN
  Administered 2020-09-23: 400 mg via ORAL
  Filled 2020-09-22: qty 1

## 2020-09-22 MED ORDER — IBUPROFEN 600 MG PO TABS
600.0000 mg | ORAL_TABLET | ORAL | Status: AC
  Administered 2020-09-22: 600 mg via ORAL
  Filled 2020-09-22: qty 1

## 2020-09-22 MED ORDER — GABAPENTIN 100 MG PO CAPS
100.0000 mg | ORAL_CAPSULE | Freq: Three times a day (TID) | ORAL | Status: DC
  Administered 2020-09-22 – 2020-09-23 (×3): 100 mg via ORAL
  Filled 2020-09-22 (×6): qty 1

## 2020-09-22 MED ORDER — TRAZODONE HCL 100 MG PO TABS
100.0000 mg | ORAL_TABLET | Freq: Every evening | ORAL | Status: DC | PRN
  Filled 2020-09-22: qty 7

## 2020-09-22 NOTE — Progress Notes (Signed)
[facility] MD Progress Note  09/22/2020 6:07 PM John [name]  MRN:  [id number] Subjective: Medical record reviewed.  Patient's case discussed in detail with members of the treatment team.  I met with and evaluated the patient on the unit today for follow-up.  Patient appears improved.  He reports his mood is better.  He denies depressed mood or anhedonia.  He denies significant anxiety.  Patient denies SI, AI, HI, AH, VH or PI.  He has been eating and sleeping well except for some initial insomnia.  He denies any medication side effects.  Patient complains of tooth pain on one of his right upper molars that he broke several weeks ago.  He denies other physical problems.  Patient slept 5 hours last night.  He is taking scheduled medications as prescribed.  Staff report that he has been visible in the day room with peers and has been cooperative and calm.  Staff have not observed patient to attend or respond to internal stimuli.  Principal Problem: Depression, major, recurrent, severe with psychosis ([place]) Diagnosis: Principal Problem:   Depression, major, recurrent, severe with psychosis ([place]) Active Problems:   Alcohol use disorder, severe, dependence ([place])   Cocaine use disorder, severe, dependence ([place])   Cannabis use disorder, moderate, dependence ([place])   Polysubstance abuse ([place])  Total Time spent with patient:  25 minutes  Past Psychiatric History: See admission H&P  Past Medical History:  Past Medical History:  Diagnosis Date   Alcohol abuse    Alcoholism ([place])    Anxiety    Asthma    Asthma    Bipolar disorder ([place])    Chronic low back pain    Cocaine abuse ([place])    Depression    Gout    Gout    HIV (human immunodeficiency virus infection) ([place])    "dx'd ~ 2 yr ago" (09/29/2012)   HIV (human immunodeficiency virus infection) ([place])    HIV disease ([place])    Homelessness    Hypertension    Marijuana abuse    Mental disorder    Schizophrenia ([place])     Past Surgical History:   Procedure Laterality Date   SKIN GRAFT FULL THICKNESS LEG Left ?   POSTERIOR LEFT LEG  AFTER DOG BITES   Family History:  Family History  Problem Relation Age of Onset   Alcoholism Mother    Depression Mother    Alcoholism Brother    Family Psychiatric  History: See admission H&P Social History:  Social History   Substance and Sexual Activity  Alcohol Use Yes   Alcohol/week: 12.0 standard drinks   Types: 12 Cans of beer per week   Comment: Daily. "As much as I can get."     Social History   Substance and Sexual Activity  Drug Use Yes   Types: Marijuana, "Crack" cocaine   Comment: States been drinking 4-40oz beers daily.    Social History   Socioeconomic History   Marital status: Single    Spouse name: Not on file   Number of children: 0   Years of education: Not on file   Highest education level: 9th grade  Occupational History   Occupation: unemployed  Tobacco Use   Smoking status: Every Day    Packs/day: 0.50    Years: 27.00    Pack years: 13.50    Types: Cigarettes   Smokeless tobacco: Never  Vaping Use   Vaping Use: Never used  Substance and Sexual Activity   Alcohol use: Yes  Alcohol/week: 12.0 standard drinks    Types: 12 Cans of beer per week    Comment: Daily. "As much as I can get."   Drug use: Yes    Types: Marijuana, "Crack" cocaine    Comment: States been drinking 4-40oz beers daily.   Sexual activity: Not Currently    Partners: Male    Birth control/protection: Condom    Comment: given condoms 04/10/20  Other Topics Concern   Not on file  Social History Narrative   ** Merged History Encounter **       ** Merged History Encounter **       Social Determinants of Health   Financial Resource Strain: High Risk   Difficulty of Paying Living Expenses: Very hard  Food Insecurity: Landscape architect Present   Worried About Charity fundraiser in the Last Year: Sometimes true   Arboriculturist in the Last Year: Sometimes true   Transportation Needs: Public librarian (Medical): Yes   Lack of Transportation (Non-Medical): Yes  Physical Activity: Inactive   Days of Exercise per Week: 0 days   Minutes of Exercise per Session: 0 min  Stress: Stress Concern Present   Feeling of Stress : Rather much  Social Connections: Moderately Isolated   Frequency of Communication with Friends and Family: Three times a week   Frequency of Social Gatherings with Friends and Family: Three times a week   Attends Religious Services: Never   Active Member of Clubs or Organizations: Yes   Attends Music therapist: More than 4 times per year   Marital Status: Never married   Additional Social History:                         Sleep: Fair  Appetite:  Good  Current Medications: Current Facility-Administered Medications  Medication Dose Route Frequency Provider Last Rate Last Admin   acetaminophen (TYLENOL) tablet 650 mg  650 mg Oral Q6H PRN Rankin, Shuvon B, NP   650 mg at 09/22/20 0942   albuterol (VENTOLIN HFA) 108 (90 Base) MCG/ACT inhaler 2 puff  2 puff Inhalation Q4H PRN Arthor Captain, MD   2 puff at 09/21/20 1653   alum & mag hydroxide-simeth (MAALOX/MYLANTA) 200-200-20 MG/5ML suspension 30 mL  30 mL Oral Q4H PRN Rankin, Shuvon B, NP       busPIRone (BUSPAR) tablet 5 mg  5 mg Oral BID Derrill Center, NP   5 mg at 09/22/20 1720   Darunavir-Cobicisctat-Emtricitabine-Tenofovir Alafenamide (SYMTUZA) 800-150-200-10 MG TABS 1 tablet  1 tablet Oral Q breakfast Rankin, Shuvon B, NP   1 tablet at 09/22/20 0821   feeding supplement (ENSURE ENLIVE / ENSURE PLUS) liquid 237 mL  237 mL Oral BID BM Nelda Marseille, Amy E, MD   237 mL at 09/22/20 1100   gabapentin (NEURONTIN) capsule 100 mg  100 mg Oral TID Arthor Captain, MD   100 mg at 09/22/20 1720   magnesium hydroxide (MILK OF MAGNESIA) suspension 30 mL  30 mL Oral Daily PRN Rankin, Shuvon B, NP       mirtazapine (REMERON) tablet 15  mg  15 mg Oral QHS Arthor Captain, MD   15 mg at 09/21/20 2135   multivitamin with minerals tablet 1 tablet  1 tablet Oral Daily Arthor Captain, MD   1 tablet at 09/22/20 0821   QUEtiapine (SEROQUEL) tablet 100 mg  100 mg Oral QHS Rankin, Shuvon  B, NP   100 mg at 09/21/20 2135   thiamine (B-1) injection 100 mg  100 mg Intramuscular Once Arthor Captain, MD       thiamine tablet 100 mg  100 mg Oral Daily Arthor Captain, MD   100 mg at 09/22/20 1950   traZODone (DESYREL) tablet 50 mg  50 mg Oral QHS PRN,MR X 1 Arthor Captain, MD   50 mg at 09/20/20 2132    Lab Results: No results found for this or any previous visit (from the past 3 hour(s)).  Blood Alcohol level:  Lab Results  Component Value Date   ETH <10 09/16/2020   ETH <10 93/26/7124    Metabolic Disorder Labs: Lab Results  Component Value Date   HGBA1C 5.2 09/18/2020   MPG 102.54 09/18/2020   MPG 96.8 03/24/2019   Lab Results  Component Value Date   PROLACTIN 6.7 09/29/2012   Lab Results  Component Value Date   CHOL 194 09/18/2020   TRIG 86 09/18/2020   HDL 83 09/18/2020   CHOLHDL 2.3 09/18/2020   VLDL 17 09/18/2020   LDLCALC 94 09/18/2020   LDLCALC 60 04/10/2020    Physical Findings: AIMS: Facial and Oral Movements Muscles of Facial Expression: None, normal Lips and Perioral Area: None, normal Jaw: None, normal Tongue: None, normal,Extremity Movements Upper (arms, wrists, hands, fingers): None, normal Lower (legs, knees, ankles, toes): None, normal, Trunk Movements Neck, shoulders, hips: None, normal, Overall Severity Severity of abnormal movements (highest score from questions above): None, normal Incapacitation due to abnormal movements: None, normal Patient's awareness of abnormal movements (rate only patient's report): No Awareness, Dental Status Current problems with teeth and/or dentures?: No Does patient usually wear dentures?: No  CIWA:  CIWA-Ar Total: 1 COWS:     Musculoskeletal: Strength &  Muscle Tone: within normal limits Gait & Station: normal Patient leans: N/A  Psychiatric Specialty Exam:  Presentation  General Appearance: Appropriate for Environment  Eye Contact:Good  Speech:Clear and Coherent; Normal Rate  Speech Volume:Normal  Handedness:Right   Mood and Affect  Mood:Euthymic  Affect:Appropriate; Congruent   Thought Process  Thought Processes:Coherent; Goal Directed  Descriptions of Associations:Intact  Orientation:Full (Time, Place and Person)  Thought Content:Logical  History of Schizophrenia/Schizoaffective disorder:No  Duration of Psychotic Symptoms:No data recorded Hallucinations:Hallucinations: None  Ideas of Reference:None  Suicidal Thoughts:Suicidal Thoughts: No  Homicidal Thoughts:Homicidal Thoughts: No   Sensorium  Memory:Immediate Good; Recent Good; Remote Good  Judgment:Fair  Insight:Fair   Executive Functions  Concentration:Good  Attention Span:Good  [place] of Knowledge:Good  Language:Good   Psychomotor Activity  Psychomotor Activity:Psychomotor Activity: Normal   Assets  Assets:Communication Skills; Desire for Improvement; Social Support; Resilience   Sleep  Sleep:Sleep: Fair Number of Hours of Sleep: 5    Physical Exam: Physical Exam Vitals and nursing note reviewed.  Constitutional:      General: He is not in acute distress.    Appearance: Normal appearance. He is not diaphoretic.  HENT:     Head: Normocephalic and atraumatic.  Pulmonary:     Effort: Pulmonary effort is normal.  Neurological:     General: No focal deficit present.     Mental Status: He is alert and oriented to person, place, and time.   Review of Systems  Constitutional:  Negative for chills, diaphoresis and fever.  HENT:  Negative for sore throat.        Positive for tooth pain  Respiratory:  Negative for cough and shortness of breath.  Cardiovascular:  Negative for chest pain and palpitations.   Gastrointestinal:  Negative for blood in stool, constipation, nausea and vomiting.  Musculoskeletal:  Negative for back pain.  Skin:  Negative for rash.  Neurological:  Negative for dizziness and tremors.  Psychiatric/Behavioral:  Negative for depression, hallucinations and suicidal ideas. The patient is not nervous/anxious and does not have insomnia.   Blood pressure 111/79, pulse (!) 103, temperature 97.6 F (36.4 C), temperature source Oral, resp. rate 16, height '5\' 7"'  (1.702 m), weight 63 kg, SpO2 100 %. Body mass index is 21.77 kg/m.   Treatment Plan Summary: Daily contact with patient to assess and evaluate symptoms and progress in treatment and Medication management  Major depressive disorder -Continue Remeron 15 mg at bedtime -Continue buspirone 5 mg twice daily -Continue Seroquel 100 mg at bedtime  HIV -Continue Symtuza daily  Pain -Continue acetaminophen 650 mg Q6H PRN -Start ibuprofen 400 mg Q8H PRN -Start gabapentin 100 mg 3 times daily  Insomnia -Increase trazodone to 100 mg QHS PRN  Discharge planning in progress  Arthor Captain, MD 09/22/2020, 6:07 PM

## 2020-09-22 NOTE — Progress Notes (Signed)
   09/22/20 1200  Psych Admission Type (Psych Patients Only)  Admission Status Voluntary  Psychosocial Assessment  Patient Complaints Irritability  Eye Contact Brief  Facial Expression Anxious  Affect Appropriate to circumstance  Speech Logical/coherent  Interaction Minimal  Motor Activity Restless  Appearance/Hygiene Disheveled  Behavior Characteristics Cooperative  Mood Irritable  Thought Process  Coherency Concrete thinking  Content WDL  Delusions None reported or observed;WDL  Perception WDL  Hallucination None reported or observed  Judgment Poor  Confusion None  Danger to Self  Current suicidal ideation? Denies  Self-Injurious Behavior No self-injurious ideation or behavior indicators observed or expressed   Agreement Not to Harm Self Yes  Description of Agreement verbal  Danger to Others  Danger to Others None reported or observed

## 2020-09-22 NOTE — BHH Group Notes (Signed)
Type of Therapy and Topic:  Group Therapy - Healthy vs Unhealthy Coping Skills   Participation Level: Active   Description of Group The focus of this group was to determine what unhealthy coping techniques typically are used by group members and what healthy coping techniques would be helpful in coping with various problems. Patients were guided in becoming aware of the differences between healthy and unhealthy coping techniques. Patients were asked to identify 2-3 healthy coping skills they would like to learn to use more effectively.   Therapeutic Goals 1. Patients learned that coping is what human beings do all day long to deal with various situations in their lives 2. Patients defined and discussed healthy vs unhealthy coping techniques 3. Patients identified their preferred coping techniques and identified whether these were healthy or unhealthy 4. Patients determined 2-3 healthy coping skills they would like to become more familiar with and use more often. 5. Patients provided support and ideas to each other     Summary of Patient Progress: Due to the acuity and complex discharge plans, group was not held. Patient was provided therapeutic worksheets and asked to meet with CSW as needed.  Elayah Klooster, LCSWA Clinicial Social Worker [facility]  

## 2020-09-22 NOTE — Progress Notes (Signed)
Psychoeducational Group Note  Date:  09/22/2020 Time:  2015  Group Topic/Focus:  Wrap up group  Participation Level: Did Not Attend  Participation Quality:  Not Applicable  Affect:  Not Applicable  Cognitive:  Not Applicable  Insight:  Not Applicable  Engagement in Group: Not Applicable  Additional Comments:  Did not attend.   Shellia Cleverly 09/22/2020, 9:11 PM

## 2020-09-22 NOTE — Progress Notes (Signed)
   09/22/20 2212  Psych Admission Type (Psych Patients Only)  Admission Status Voluntary  Psychosocial Assessment  Patient Complaints Anxiety  Eye Contact Fair  Facial Expression Anxious  Affect Appropriate to circumstance  Speech Logical/coherent  Interaction Minimal  Motor Activity Restless  Appearance/Hygiene Disheveled  Behavior Characteristics Cooperative  Mood Anxious  Thought Process  Coherency WDL  Content WDL  Delusions None reported or observed  Perception WDL  Hallucination None reported or observed  Judgment Impaired  Confusion None  Danger to Self  Current suicidal ideation? Denies  Self-Injurious Behavior No self-injurious ideation or behavior indicators observed or expressed   Agreement Not to Harm Self Yes  Description of Agreement verbal  Danger to Others  Danger to Others None reported or observed  D: Patient in his room coming out only to get snacks and medication. Pt denies any withdrawal symptoms and is compliant with medications.  A: Medications administered as prescribed. Support and encouragement provided as needed.  R: Patient remains safe on the unit. Will continue to monitor for safety and stability.

## 2020-09-22 NOTE — BHH Group Notes (Signed)
Occupational Therapy Group Note Date: 09/22/2020 Group Topic/Focus: Feelings Management  Group Description: Group encouraged increased engagement and participation through discussion focused on Building Happiness. Patients were provided a handout and reviewed therapeutic strategies to build happiness including identifying gratitudes, random acts of kindness, exercise, meditation, positive journaling, and fostering relationships. Patients engaged in discussion and encouraged to reflect on each strategy and their experiences.  Therapeutic Goal(s): Identify strategies to build happiness. Identify and implement therapeutic strategies to improve overall mood. Practice and identify gratitudes, random acts of kindness, exercise, meditation, positive journaling, and fostering relationships Participation Level: Minimal   Participation Quality: Partial Attendance - left after 15 minutes   Behavior: Alert   Speech/Thought Process: Directed   Affect/Mood: Irritable   Insight: Limited   Judgement: Limited   Individualization: John [name] was initially present and active in their participation of group discussion/activity. Pt identified 'my daughter' as something that brings him happiness. Pt left shortly after sharing; appeared irritable at peers in group.   Modes of Intervention: Activity, Discussion, and Education  Patient Response to Interventions:  Attentive   Plan: Continue to engage patient in OT groups 2 - 3x/week.  09/22/2020  John [name], MOT, OTR/L

## 2020-09-22 NOTE — Progress Notes (Signed)
Recreation Therapy Notes  Date: 7.11.22 Time: 0930 Location: [address] Dayroom   Group Topic: Stress Management   Goal Area(s) Addresses: Patient will actively participate in stress management techniques presented during session. Patient will successfully identify benefit of practicing stress management post d/c.   Intervention: Guided exercise with ambient sound and script   Activity :Guided Imagery   LRT provided education, instruction, and demonstration on practice of visualization via guided imagery. Patient was asked to participate in the technique introduced during session. LRT also debriefed including topics of mindfulness, stress management and specific scenarios each patient could use these techniques. Patients were given suggestions of ways to access scripts post d/c and encouraged to explore Youtube and other apps available on smartphones, tablets, and computers.   Education:  Stress Management, Discharge Planning.   Education Outcome: Acknowledges education   Clinical Observations/Feedback: Patient did not attend group session.         [name], LRT/CTRS         Ria Comment, Nicolas Sisler A 09/22/2020 11:10 AM

## 2020-09-23 LAB — T3, FREE: T3, Free: 2.2 pg/mL (ref 2.0–4.4)

## 2020-09-23 MED ORDER — TRAZODONE HCL 100 MG PO TABS: 100.0000 mg | ORAL_TABLET | Freq: Every evening | ORAL | 0 refills | Status: DC | PRN

## 2020-09-23 MED ORDER — BUSPIRONE HCL 5 MG PO TABS: 5.0000 mg | ORAL_TABLET | Freq: Two times a day (BID) | ORAL | 0 refills | Status: DC

## 2020-09-23 MED ORDER — MIRTAZAPINE 15 MG PO TABS: 15.0000 mg | ORAL_TABLET | Freq: Every day | ORAL | 0 refills | Status: DC

## 2020-09-23 MED ORDER — QUETIAPINE FUMARATE 100 MG PO TABS: 100.0000 mg | ORAL_TABLET | Freq: Every day | ORAL | 0 refills | Status: DC
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
## 2020-09-23 NOTE — Progress Notes (Signed)
  [facility] Adult Case Management Discharge Plan :  Will you be returning to the same living situation after discharge:  Yes,  Home  At discharge, do you have transportation home?: Yes,  Scarlette Calico Social Worker  Do you have the ability to pay for your medications: Yes,  Community   Release of information consent forms completed and in the chart;  Patient's signature needed at discharge.  Patient to Follow up at:  [facility]. Go on 10/03/2020.   Specialty: Behavioral Health Why: You have an appointment for medication management on 10/03/20 at 3:30 pm.  This appointment will be held in person.  Therapy services are also available. Contact information: [place] for Infectious Disease. Go on 10/27/2020.   Specialty: Infectious Diseases Why: You have an appointment on 10/27/20 at 10:15 am. Contact information: [place], [place] [phone] [place] [phone]                Next level of care provider has access to [place] and Suicide Prevention discussed: Yes,  with patient and Scarlette Calico     Has patient been referred to the Quitline?: Patient refused referral  Patient has been referred for addiction treatment: Pt. refused referral  Darleen Crocker, [place] 09/23/2020, 10:08 AM

## 2020-09-23 NOTE — Discharge Summary (Signed)
Physician Discharge Summary Note  Patient:  John [name] is an 50 y.o., male MRN:  [id number] DOB:  15-Nov-1970 Patient phone:  [phone] (home)  Patient address:   [place] [address],  Total Time spent with patient: 30 minutes  Date of Admission:  09/17/2020 Date of Discharge: 09/23/2020  Reason for Admission: Suicidal ideation, nonspecific thoughts of harming others and hallucinations  Principal Problem: Depression, major, recurrent, severe with psychosis ([place]) Discharge Diagnoses: Principal Problem:   Depression, major, recurrent, severe with psychosis ([place]) Active Problems:   Alcohol use disorder, severe, dependence ([place] [name])   Cocaine use disorder, severe, dependence ([place])   Cannabis use disorder, moderate, dependence ([place])   Polysubstance abuse ([place])   Past Psychiatric History: Patient has been diagnosed with recurrent major depression versus bipolar 1 disorder, GAD, PTSD in the past.  He also reports being diagnosed with "bipolar schizophrenia" in the past.  He has undergone multiple medication trials.  He first had psychiatric treatment at age 33.  Patient states that he has been admitted to inpatient psychiatric hospitals on numerous occasions in the past and estimates more than 20 inpatient admission during his life.  Admissions typically occur for suicidal ideation.  The patient's most recent hospitalization at [name] was from 03/23/2019 until 03/27/2019 for major depression with psychotic features and cannabis use.  Presentation was similar to the current admission.  During that admission patient ultimately confessed that he was malingering psychosis.  He was discharged on Remeron 15 mg nightly, buspirone 10 mg 3 times daily, trazodone 300 mg nightly and quetiapine 50 mg 3 times daily.  He has a history of at least 3 prior suicide attempts with the most recent attempt occurring in 1998 by cutting his wrists.  He denies any history of nonsuicidal self-injurious  behavior.  Past Medical History:  Past Medical History:  Diagnosis Date   Alcohol abuse    Alcoholism ([place])    Anxiety    Asthma    Asthma    Bipolar disorder ([place])    Chronic low back pain    Cocaine abuse ([place])    Depression    Gout    Gout    HIV (human immunodeficiency virus infection) ([place])    "dx'd ~ 2 yr ago" (09/29/2012)   HIV (human immunodeficiency virus infection) ([place])    HIV disease ([place])    Homelessness    Hypertension    Marijuana abuse    Mental disorder    Schizophrenia ([place])     Past Surgical History:  Procedure Laterality Date   SKIN GRAFT FULL THICKNESS LEG Left ?   POSTERIOR LEFT LEG  AFTER DOG BITES   Family History:  Family History  Problem Relation Age of Onset   Alcoholism Mother    Depression Mother    Alcoholism Brother    Family Psychiatric  History: Patient reports that his brother had problems with alcohol.  He denies any other family history of alcohol or substance use issues, mental health diagnoses or suicide.  Chart review indicates prior report that mother had problems with depression and alcoholism Social History:  Social History   Substance and Sexual Activity  Alcohol Use Yes   Alcohol/week: 12.0 standard drinks   Types: 12 Cans of beer per week   Comment: Daily. "As much as I can get."     Social History   Substance and Sexual Activity  Drug Use Yes   Types: Marijuana, "Crack" cocaine   Comment: States been drinking  4-40oz beers daily.    Social History   Socioeconomic History   Marital status: Single    Spouse name: Not on file   Number of children: 0   Years of education: Not on file   Highest education level: 9th grade  Occupational History   Occupation: unemployed  Tobacco Use   Smoking status: Every Day    Packs/day: 0.50    Years: 27.00    Pack years: 13.50    Types: Cigarettes   Smokeless tobacco: Never  Vaping Use   Vaping Use: Never used  Substance and Sexual Activity   Alcohol use: Yes     Alcohol/week: 12.0 standard drinks    Types: 12 Cans of beer per week    Comment: Daily. "As much as I can get."   Drug use: Yes    Types: Marijuana, "Crack" cocaine    Comment: States been drinking 4-40oz beers daily.   Sexual activity: Not Currently    Partners: Male    Birth control/protection: Condom    Comment: given condoms 04/10/20  Other Topics Concern   Not on file  Social History Narrative   ** Merged History Encounter **       ** Merged History Encounter **       Social Determinants of Health   Financial Resource Strain: High Risk   Difficulty of Paying Living Expenses: Very hard  Food Insecurity: Landscape architect Present   Worried About Charity fundraiser in the Last Year: Sometimes true   Arboriculturist in the Last Year: Sometimes true  Transportation Needs: Public librarian (Medical): Yes   Lack of Transportation (Non-Medical): Yes  Physical Activity: Inactive   Days of Exercise per Week: 0 days   Minutes of Exercise per Session: 0 min  Stress: Stress Concern Present   Feeling of Stress : Rather much  Social Connections: Moderately Isolated   Frequency of Communication with Friends and Family: Three times a week   Frequency of Social Gatherings with Friends and Family: Three times a week   Attends Religious Services: Never   Active Member of Clubs or Organizations: Yes   Attends Music therapist: More than 4 times per year   Marital Status: Never married    [name] Course:    Initial evaluation on admission to [name]: John [name] is a 50 year old homeless male with history significant for multiple inpatient psychiatric hospitalizations, remote history of suicide attempts and diagnoses of major depressive disorder recurrent versus bipolar disorder, PTSD, and substance use disorder (alcohol, cannabis, cocaine) as well as HIV infection and asthma who presented to [name] on 09/16/2020 via EMS after he called for  assistance due to breathing difficulties/asthma exacerbation.  While being evaluated in the ED, the patient reported suicidal ideation and nonspecific thoughts of harming others and requested inpatient psychiatric admission for a couple weeks to get back on his medications.   From chart review, the patient has past history of self-acknowledged malingering of psychosis during a prior admission.  In the ED, patient reported seeing and hearing little green men with horns telling him to kill himself.  BAL was <10.  UDS was not performed in the ED.  (UDS performed at [name] was positive for cocaine and [name].)  Patient was transferred to [name] for inpatient psychiatric treatment.  On initial evaluation at [name], the patient was reclining in bed.  He was cooperative, maintained good eye contact and was mildly irritable with  constricted affect.  Thought processes were coherent and goal-directed without evidence of formal thought disorder.  He did not appear to attend or respond to internal stimuli.  Patient reported that he had previously been diagnosed with "bipolar schizophrenia" and had not taken his meds for the past 2 to 3 months because it was too difficult to take pills (although he did report consistent adherence with HIV medications).  He reported worsening mood and suicidal ideation prior to admission with thoughts of jumping off a parking deck or bridge or running into traffic.  Patient stated he had an asthma attack and after arriving at the [name] for breathing treatment he told ED staff of suicidality.  He continued to have passive wishes not to be alive but denied any active SI this morning in the [name].  He had fleeting nonspecific thoughts of harming others but denied any intent, plan or target.  He reported intermittent John and VH that consist of seeing and hearing "little blue men with horns or devils" that tell him to hurt or kill himself.  He endorsed anxiety, depressed mood, irritability, insomnia, low  energy, decreased interest and decreased appetite.  He denied PI.  He reported use of marijuana daily "as much as I can get" prior to admission with most recent use on 09/15/2020.  He also reported daily use of cocaine and alcohol with most recent use on 09/15/2020.  He denied any other drug use.  Patient denied any current symptoms of alcohol withdrawal.  He denied any history of complicated alcohol withdrawal or seizures.   The patient's symptoms on admission were consistent with recurrent major depression with psychotic features, alcohol use disorder, cocaine use disorder and cannabis use disorder.  The treatment team also suspected that secondary gain was likely contributing to at least some of patient's reported symptoms in the ED and on initial evaluation at [name].  To treat his symptoms of depression and his psychotic symptoms the patient was started on mirtazapine and quetiapine and later buspirone was added.  The patient tolerated these medications well without significant side effects and this combination of medications was effective in treating his presenting symptoms.  Patient was also placed on CIWA protocol during his [name] admission to cover for any possible alcohol withdrawal but he never displayed any signs or symptoms of alcohol withdrawal.  Medications were titrated for tolerability and efficacy.  Final medication combination included mirtazapine 15 mg at bedtime, quetiapine 100 mg at bedtime, buspirone 5 mg twice daily and trazodone 100 mg at bedtime PRN insomnia.  On this combination of medication, the patient reported complete resolution of hallucinations, depression, irritability and anxiety.  He also reported resolution of suicidal ideation or thoughts of harming others.  Patient's affect improved and was brighter and appropriately reactive.  Sleep, energy and appetite also improved.  On day of discharge evaluation, the patient denied depressed mood, anhedonia, hallucinations, passive  wish for death, suicidal ideation, or thoughts of harming others.  Patient expressed desire for discharge.  He displayed future oriented outlook and expressed hope for the future.  The patient was eager for an early morning discharge so that he could keep his appointments at the disability office to work on obtaining housing and monthly benefits.  Patient was discharged to home with transportation to home provided by his outpatient social worker.  Prior to discharge he was advised to call 911 or return to the emergency department or behavioral health urgent care [name] for any emergent or urgent concerns.  Physical Findings: AIMS: Facial and Oral Movements Muscles of Facial Expression: None, normal Lips and Perioral Area: None, normal Jaw: None, normal Tongue: None, normal,Extremity Movements Upper (arms, wrists, hands, fingers): None, normal Lower (legs, knees, ankles, toes): None, normal, Trunk Movements Neck, shoulders, hips: None, normal, Overall Severity Severity of abnormal movements (highest score from questions above): None, normal Incapacitation due to abnormal movements: None, normal Patient's awareness of abnormal movements (rate only patient's report): No Awareness, Dental Status Current problems with teeth and/or dentures?: No Does patient usually wear dentures?: No  CIWA:  CIWA-Ar Total: 1 COWS:     Musculoskeletal: Strength & Muscle Tone: within normal limits Gait & Station: normal Patient leans: N/A   Psychiatric Specialty Exam:  Presentation  General Appearance: Appropriate for Environment  Eye Contact:Good  Speech:Clear and Coherent; Normal Rate  Speech Volume:Normal  Handedness:Right   Mood and Affect  Mood:Euthymic  Affect:Appropriate; Congruent   Thought Process  Thought Processes:Coherent; Goal Directed  Descriptions of Associations:Intact  Orientation:Full (Time, Place and Person)  Thought Content:Logical  History of  Schizophrenia/Schizoaffective disorder:No  Duration of Psychotic Symptoms:No data recorded Hallucinations:Hallucinations: None  Ideas of Reference:None  Suicidal Thoughts:Suicidal Thoughts: No  Homicidal Thoughts:Homicidal Thoughts: No   Sensorium  Memory:Immediate Good; Recent Good; Remote Good  Judgment:Fair  Insight:Good   Executive Functions  Concentration:Good  Attention Span:Good  [place] of Knowledge:Good  Language:Good   Psychomotor Activity  Psychomotor Activity:Psychomotor Activity: Normal   Assets  Assets:Communication Skills; Desire for Improvement; Social Support; Resilience   Sleep  Sleep:Sleep: Good Number of Hours of Sleep: 5.75    Physical Exam: Physical Exam Vitals and nursing note reviewed. Constitutional:      General: He is not in acute distress.    Appearance: Normal appearance. He is not diaphoretic. HENT:    Head: Normocephalic and atraumatic. Cardiovascular:    Rate and Rhythm: Normal rate. Pulmonary:    Effort: Pulmonary effort is normal. Neurological:    General: No focal deficit present.    Mental Status: He is alert and oriented to person, place, and time.   ROS Constitutional:  Negative for chills, diaphoresis, fever and malaise/fatigue. HENT:  Negative for sore throat.   Respiratory:  Negative for cough and shortness of breath.   Cardiovascular:  Negative for chest pain and palpitations. Gastrointestinal:  Negative for constipation, diarrhea, nausea and vomiting. Musculoskeletal: Negative.   Neurological:  Negative for dizziness, tremors, seizures and headaches. Psychiatric/Behavioral:  Negative for depression, hallucinations, memory loss and suicidal ideas. The patient is not nervous/anxious and does not have insomnia.   All other systems reviewed and are negative.  Blood pressure 110/78, pulse 84, temperature 98.3 F (36.8 C), temperature source Oral, resp. rate 18, height 5\' 7"  (1.702 m), weight 63  kg, SpO2 100 %. Body mass index is 21.77 kg/m.   Social History   Tobacco Use  Smoking Status Every Day   Packs/day: 0.50   Years: 27.00   Pack years: 13.50   Types: Cigarettes  Smokeless Tobacco Never   Tobacco Cessation:  N/A, patient does not currently use tobacco products   Blood Alcohol level:  Lab Results  Component Value Date   ETH <10 09/16/2020   ETH <10 62/69/4854    Metabolic Disorder Labs:  Lab Results  Component Value Date   HGBA1C 5.2 09/18/2020   MPG 102.54 09/18/2020   MPG 96.8 03/24/2019   Lab Results  Component Value Date   PROLACTIN 6.7 09/29/2012   Lab Results  Component Value Date  CHOL 194 09/18/2020   TRIG 86 09/18/2020   HDL 83 09/18/2020   CHOLHDL 2.3 09/18/2020   VLDL 17 09/18/2020   LDLCALC 94 09/18/2020   LDLCALC 60 04/10/2020    See Psychiatric Specialty Exam and Suicide Risk Assessment completed by Attending Physician prior to discharge.  Discharge destination:  Home  Is patient on multiple antipsychotic therapies at discharge:  No   Has Patient had three or more failed trials of antipsychotic monotherapy by history:  No  Recommended Plan for Multiple Antipsychotic Therapies: NA  Discharge Instructions     Diet - low sodium heart healthy   Complete by: As directed    Increase activity slowly   Complete by: As directed       Allergies as of 09/23/2020       Reactions   Peanut-containing Drug Products Anaphylaxis   Peanuts in general.   Shellfish Allergy Anaphylaxis, Swelling        Medication List     TAKE these medications      Indication  albuterol 108 (90 Base) MCG/ACT inhaler Commonly known as: VENTOLIN HFA INHALE 2 PUFFS INTO THE LUNGS EVERY 4 HOURS AS NEEDED FOR WHEEZING OR SHORTNESS OF BREATH  Indication: Asthma   busPIRone 5 MG tablet Commonly known as: BUSPAR Take 1 tablet (5 mg total) by mouth 2 (two) times daily. What changed:  medication strength how much to take how to take this when  to take this additional instructions  Indication: Anxiety Disorder   mirtazapine 15 MG tablet Commonly known as: REMERON Take 1 tablet (15 mg total) by mouth at bedtime. What changed:  medication strength how much to take  Indication: Major Depressive Disorder   QUEtiapine 100 MG tablet Commonly known as: SEROQUEL Take 1 tablet (100 mg total) by mouth at bedtime. What changed: when to take this  Indication: Major Depressive Disorder   Symtuza 800-150-200-10 MG Tabs Generic drug: Darunavir-Cobicisctat-Emtricitabine-Tenofovir Alafenamide Take 1 tablet by mouth every morning.  Indication: HIV Disease   traZODone 100 MG tablet Commonly known as: DESYREL Take 1 tablet (100 mg total) by mouth at bedtime as needed for sleep. What changed:  when to take this reasons to take this  Indication: [place]. Go on 10/03/2020.   Specialty: Behavioral Health Why: You have an appointment for medication management on 10/03/20 at 3:30 pm.  This appointment will be held in person.  Therapy services are also available. Contact information: [facility]. Go on 10/27/2020.   Specialty: Infectious Diseases Why: You have an appointment on 10/27/20 at 10:15 am. Contact information: [address] [place], [place] [id number] [place] [phone]                Follow-up recommendations:    Activity:  as tolerated   Tests:  You will periodically need to have blood drawn for lab work to check cholesterol and blood sugar while you are taking Seroquel/quetiapine.  Your outpatient doctor will let you know when lab work needs to be performed.   Other:   -Take medications as prescribed.   -Do not drink alcohol.  Do not use marijuana/cannabis or other drugs.   -Attend  outpatient 12-step groups and outpatient substance abuse treatment program.   -Keep outpatient mental health follow-up appointments  with therapist and psychiatrist.   -See your primary care provider and follow-up in ID clinic for treatment of medical conditions.       Signed: Arthor Captain, MD 09/23/2020, 10:04 AM

## 2020-09-23 NOTE — Progress Notes (Signed)
Discharge Note:   Pt discharged at 10:15am left with is ride, his case worker to home. Pt upon discharge was alert and oriented to person, place, time and situation. Pt denies suicidal and homicidal ideation, denies hallucinations, denies feelings of depression and anxiety, and was calm and cooperative. Pt given discharge instructions, prescriptions sent electronically to pt's pharmacy, discharge medication educations provided, follow up appointments given to pt; pt verbalized understanding of all. No distress noted, none reported, pt voices no complaints.

## 2020-10-03 ENCOUNTER — Encounter (HOSPITAL_COMMUNITY): Payer: Self-pay | Admitting: Physician Assistant

## 2020-10-03 ENCOUNTER — Other Ambulatory Visit: Payer: Self-pay

## 2020-10-03 ENCOUNTER — Ambulatory Visit (INDEPENDENT_AMBULATORY_CARE_PROVIDER_SITE_OTHER): Payer: No Payment, Other | Admitting: Physician Assistant

## 2020-10-03 VITALS — BP 122/73 | HR 72 | Ht 67.0 in | Wt 147.0 lb

## 2020-10-03 DIAGNOSIS — F4310 Post-traumatic stress disorder, unspecified: Secondary | ICD-10-CM | POA: Diagnosis not present

## 2020-10-03 DIAGNOSIS — F5105 Insomnia due to other mental disorder: Secondary | ICD-10-CM

## 2020-10-03 DIAGNOSIS — F3164 Bipolar disorder, current episode mixed, severe, with psychotic features: Secondary | ICD-10-CM | POA: Diagnosis not present

## 2020-10-03 DIAGNOSIS — F1020 Alcohol dependence, uncomplicated: Secondary | ICD-10-CM

## 2020-10-03 DIAGNOSIS — F411 Generalized anxiety disorder: Secondary | ICD-10-CM | POA: Diagnosis not present

## 2020-10-03 DIAGNOSIS — F1420 Cocaine dependence, uncomplicated: Secondary | ICD-10-CM

## 2020-10-03 DIAGNOSIS — F1220 Cannabis dependence, uncomplicated: Secondary | ICD-10-CM

## 2020-10-03 DIAGNOSIS — F99 Mental disorder, not otherwise specified: Secondary | ICD-10-CM

## 2020-10-03 MED ORDER — MIRTAZAPINE 15 MG PO TABS: 15.0000 mg | ORAL_TABLET | Freq: Every day | ORAL | 1 refills | Status: DC

## 2020-10-03 MED ORDER — QUETIAPINE FUMARATE 100 MG PO TABS: 100.0000 mg | ORAL_TABLET | Freq: Two times a day (BID) | ORAL | 1 refills | Status: DC

## 2020-10-03 MED ORDER — QUETIAPINE FUMARATE 100 MG PO TABS: 100.0000 mg | ORAL_TABLET | Freq: Every day | ORAL | 1 refills | Status: DC

## 2020-10-03 MED ORDER — BUSPIRONE HCL 5 MG PO TABS: 5.0000 mg | ORAL_TABLET | Freq: Two times a day (BID) | ORAL | 1 refills | Status: DC

## 2020-10-03 MED ORDER — TRAZODONE HCL 100 MG PO TABS: 100.0000 mg | ORAL_TABLET | Freq: Every evening | ORAL | 1 refills | Status: DC | PRN
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
## 2020-10-04 DIAGNOSIS — F1220 Cannabis dependence, uncomplicated: Secondary | ICD-10-CM | POA: Insufficient documentation

## 2020-10-04 MED ORDER — BUSPIRONE HCL 7.5 MG PO TABS: 7.5000 mg | ORAL_TABLET | Freq: Two times a day (BID) | ORAL | 1 refills | Status: AC

## 2020-10-06 ENCOUNTER — Telehealth (HOSPITAL_COMMUNITY): Payer: Self-pay | Admitting: *Deleted

## 2020-10-06 NOTE — Telephone Encounter (Signed)
Clarification on med order: Quetiapine (SEROQUEL) 100 mg . As order in hospital  Route: Take 1 tablet (100 mg total) by mouth at bedtime

## 2020-10-13 ENCOUNTER — Encounter: Payer: Self-pay | Admitting: *Deleted

## 2020-10-13 NOTE — Congregational Nurse Program (Signed)
  Dept: [place] Nurse Program Note  Date of Encounter: 10/13/2020  Past Medical History: Past Medical History:  Diagnosis Date   Alcohol abuse    Alcoholism ([place])    Anxiety    Asthma    Asthma    Bipolar disorder ([place])    Chronic low back pain    Cocaine abuse ([place])    Depression    Gout    Gout    HIV (human immunodeficiency virus infection) ([place])    "dx'd ~ 2 yr ago" (09/29/2012)   HIV (human immunodeficiency virus infection) ([place])    HIV disease ([place])    Homelessness    Hypertension    Marijuana abuse    Mental disorder    Schizophrenia ([place])     Encounter Details:  Client came in [facility] early having shortness of breath with difficulty breathing. Assessed client and he has hx of asthma with no inhaler. Audrea Muscat Placey was contacted and received orders to give client a nebulizer treatment. Gave as ordered and reassessed client. Client is breathing better and vitals along with pulse O2 WNL. Referred client to Marliss Coots NP.  Zae Kirtz W RN CN [phone]

## 2020-10-19 ENCOUNTER — Emergency Department (HOSPITAL_COMMUNITY): Payer: Medicaid Other

## 2020-10-19 ENCOUNTER — Encounter (HOSPITAL_COMMUNITY): Payer: Self-pay

## 2020-10-19 ENCOUNTER — Emergency Department (HOSPITAL_COMMUNITY)
Admission: EM | Admit: 2020-10-19 | Discharge: 2020-10-19 | Disposition: A | Discharge status: Home / Self Care | Payer: Medicaid Other | Attending: Emergency Medicine | Admitting: Emergency Medicine

## 2020-10-19 ENCOUNTER — Other Ambulatory Visit: Payer: Self-pay

## 2020-10-19 DIAGNOSIS — I10 Essential (primary) hypertension: Secondary | ICD-10-CM | POA: Diagnosis not present

## 2020-10-19 DIAGNOSIS — R0603 Acute respiratory distress: Secondary | ICD-10-CM | POA: Insufficient documentation

## 2020-10-19 DIAGNOSIS — Z21 Asymptomatic human immunodeficiency virus [HIV] infection status: Secondary | ICD-10-CM | POA: Diagnosis not present

## 2020-10-19 DIAGNOSIS — F17210 Nicotine dependence, cigarettes, uncomplicated: Secondary | ICD-10-CM | POA: Diagnosis not present

## 2020-10-19 DIAGNOSIS — Z91010 Allergy to peanuts: Secondary | ICD-10-CM | POA: Diagnosis not present

## 2020-10-19 DIAGNOSIS — J4521 Mild intermittent asthma with (acute) exacerbation: Secondary | ICD-10-CM | POA: Diagnosis not present

## 2020-10-19 DIAGNOSIS — R0602 Shortness of breath: Secondary | ICD-10-CM | POA: Diagnosis present

## 2020-10-19 MED ORDER — ALBUTEROL SULFATE HFA 108 (90 BASE) MCG/ACT IN AERS
2.0000 | INHALATION_SPRAY | RESPIRATORY_TRACT | Status: DC | PRN
  Administered 2020-10-19: 2 via RESPIRATORY_TRACT
  Filled 2020-10-19: qty 6.7

## 2020-10-19 MED ORDER — PREDNISONE 20 MG PO TABS: ORAL_TABLET | ORAL | 0 refills | Status: DC

## 2020-10-19 MED ORDER — ALBUTEROL (5 MG/ML) CONTINUOUS INHALATION SOLN
10.0000 mg/h | INHALATION_SOLUTION | Freq: Once | RESPIRATORY_TRACT | Status: AC
  Administered 2020-10-19: 10 mg/h via RESPIRATORY_TRACT
  Filled 2020-10-19: qty 20

## 2020-10-19 NOTE — ED Triage Notes (Signed)
Pt arrives via [facility] for asthma attack. EMS reports pt initially only able to speak in 2-3 word sentences  in tripod position with inspiratory and expiratory wheezing. Pt endorses recent cocaine use during triage  5 mg albuterol, 125 mg solumedrol, 2 g magnesium sulfate, 500 mcg atrovent administered by EMS with improvement.   #20 L hand by EMS   EMS last VS - 124/78, HR 80, 100% on RA, RR 18

## 2020-10-19 NOTE — Discharge Instructions (Addendum)
You have been evaluated for your symptoms.  Please use albuterol inhaler 2 puffs every 4 hours as needed for shortness of breath.  Take steroid as prescribed for next several days.  Return promptly if you have any concern.
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
## 2020-10-27 ENCOUNTER — Other Ambulatory Visit: Payer: Self-pay

## 2020-10-27 ENCOUNTER — Encounter: Payer: Self-pay | Admitting: Family

## 2020-10-27 ENCOUNTER — Ambulatory Visit (INDEPENDENT_AMBULATORY_CARE_PROVIDER_SITE_OTHER): Payer: Medicaid Other | Admitting: Family

## 2020-10-27 VITALS — BP 139/84 | HR 80 | Wt 146.0 lb

## 2020-10-27 DIAGNOSIS — Z113 Encounter for screening for infections with a predominantly sexual mode of transmission: Secondary | ICD-10-CM

## 2020-10-27 DIAGNOSIS — Z21 Asymptomatic human immunodeficiency virus [HIV] infection status: Secondary | ICD-10-CM

## 2020-10-27 DIAGNOSIS — F1410 Cocaine abuse, uncomplicated: Secondary | ICD-10-CM

## 2020-10-27 DIAGNOSIS — Z0000 Encounter for general adult medical examination without abnormal findings: Secondary | ICD-10-CM | POA: Diagnosis not present

## 2020-10-27 MED ORDER — SYMTUZA 800-150-200-10 MG PO TABS: 1.0000 | ORAL_TABLET | Freq: Every morning | ORAL | 4 refills | Status: DC

## 2020-10-27 NOTE — Assessment & Plan Note (Signed)
John [name] reports no current recreational or illicit drug use with most recent toxicology positive for marijuana and cocaine.  Counseled on importance of avoiding recreational/illicit drugs.  Currently under the care of psychiatry.  Continue to monitor.

## 2020-10-27 NOTE — Assessment & Plan Note (Signed)
John [name] continues to have well-controlled virus with good adherence and tolerance to his ART regimen of Symtuza.  No signs/symptoms of opportunistic infection.  Reviewed previous lab work and discussed plan of care.  Continue current dose of Symtuza.  Plan for follow-up in 3 months or sooner if needed with lab work on the same day.

## 2020-10-27 NOTE — Patient Instructions (Signed)
Nice to see you.  Continue to take your medications daily as prescribed.   Refills have been sent to the pharmacy.  Plan for follow up in 3 months or sooner if needed.  Have a great day and stay safe!
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
## 2020-10-27 NOTE — Assessment & Plan Note (Signed)
   Discussed importance of safe sexual practice.  Condoms provided.  Routine vaccinations up-to-date per recommendations.

## 2020-10-28 LAB — COMPLETE METABOLIC PANEL WITH GFR
AG Ratio: 1.4 (calc) (ref 1.0–2.5)
ALT: 11 U/L (ref 9–46)
AST: 15 U/L (ref 10–40)
Albumin: 4.3 g/dL (ref 3.6–5.1)
Alkaline phosphatase (APISO): 76 U/L (ref 36–130)
BUN: 15 mg/dL (ref 7–25)
CO2: 29 mmol/L (ref 20–32)
Calcium: 9.6 mg/dL (ref 8.6–10.3)
Chloride: 104 mmol/L (ref 98–110)
Creat: 0.98 mg/dL (ref 0.60–1.29)
Globulin: 3 g/dL (calc) (ref 1.9–3.7)
Glucose, Bld: 87 mg/dL (ref 65–99)
Potassium: 4.3 mmol/L (ref 3.5–5.3)
Sodium: 142 mmol/L (ref 135–146)
Total Bilirubin: 0.6 mg/dL (ref 0.2–1.2)
Total Protein: 7.3 g/dL (ref 6.1–8.1)
eGFR: 95 mL/min/{1.73_m2} (ref >=60)

## 2020-10-28 LAB — T-HELPER CELL (CD4) - (RCID CLINIC ONLY)
CD4 % Helper T Cell: 44 % (ref 33–65)
CD4 T Cell Abs: 462 /uL (ref 400–1790)

## 2020-10-28 LAB — HIV-1 RNA QUANT-NO REFLEX-BLD
HIV 1 RNA Quant: NOT DETECTED Copies/mL
HIV-1 RNA Quant, Log: NOT DETECTED Log cps/mL

## 2020-10-28 LAB — RPR: RPR Ser Ql: NONREACTIVE

## 2020-11-03 ENCOUNTER — Encounter: Payer: Self-pay | Admitting: Family

## 2020-11-04 ENCOUNTER — Other Ambulatory Visit: Payer: Self-pay | Admitting: Family

## 2020-11-04 DIAGNOSIS — Z21 Asymptomatic human immunodeficiency virus [HIV] infection status: Secondary | ICD-10-CM

## 2020-11-19 ENCOUNTER — Other Ambulatory Visit: Payer: Self-pay

## 2020-11-19 ENCOUNTER — Encounter: Payer: Self-pay | Admitting: *Deleted

## 2020-11-19 ENCOUNTER — Other Ambulatory Visit (HOSPITAL_COMMUNITY)
Admission: EM | Admit: 2020-11-19 | Discharge: 2020-11-24 | Disposition: A | Discharge status: Home / Self Care | Payer: Medicaid Other | Attending: Psychiatry | Admitting: Psychiatry

## 2020-11-19 DIAGNOSIS — Z596 Low income: Secondary | ICD-10-CM | POA: Insufficient documentation

## 2020-11-19 DIAGNOSIS — F332 Major depressive disorder, recurrent severe without psychotic features: Secondary | ICD-10-CM | POA: Diagnosis not present

## 2020-11-19 DIAGNOSIS — J45909 Unspecified asthma, uncomplicated: Secondary | ICD-10-CM | POA: Insufficient documentation

## 2020-11-19 DIAGNOSIS — F419 Anxiety disorder, unspecified: Secondary | ICD-10-CM | POA: Insufficient documentation

## 2020-11-19 DIAGNOSIS — Z20822 Contact with and (suspected) exposure to covid-19: Secondary | ICD-10-CM | POA: Diagnosis not present

## 2020-11-19 DIAGNOSIS — F3164 Bipolar disorder, current episode mixed, severe, with psychotic features: Secondary | ICD-10-CM

## 2020-11-19 DIAGNOSIS — Z21 Asymptomatic human immunodeficiency virus [HIV] infection status: Secondary | ICD-10-CM

## 2020-11-19 DIAGNOSIS — Z5941 Food insecurity: Secondary | ICD-10-CM | POA: Insufficient documentation

## 2020-11-19 DIAGNOSIS — B20 Human immunodeficiency virus [HIV] disease: Secondary | ICD-10-CM | POA: Diagnosis present

## 2020-11-19 DIAGNOSIS — Z9114 Patient's other noncompliance with medication regimen: Secondary | ICD-10-CM | POA: Insufficient documentation

## 2020-11-19 DIAGNOSIS — F4310 Post-traumatic stress disorder, unspecified: Secondary | ICD-10-CM

## 2020-11-19 DIAGNOSIS — Z560 Unemployment, unspecified: Secondary | ICD-10-CM | POA: Insufficient documentation

## 2020-11-19 DIAGNOSIS — Z9151 Personal history of suicidal behavior: Secondary | ICD-10-CM | POA: Insufficient documentation

## 2020-11-19 DIAGNOSIS — R45851 Suicidal ideations: Secondary | ICD-10-CM

## 2020-11-19 DIAGNOSIS — F339 Major depressive disorder, recurrent, unspecified: Secondary | ICD-10-CM | POA: Diagnosis present

## 2020-11-19 DIAGNOSIS — F17210 Nicotine dependence, cigarettes, uncomplicated: Secondary | ICD-10-CM | POA: Insufficient documentation

## 2020-11-19 LAB — ETHANOL: Alcohol, Ethyl (B): <10 mg/dL (ref <10)

## 2020-11-19 LAB — CBC WITH DIFFERENTIAL/PLATELET
Abs Immature Granulocytes: 0.02 10*3/uL (ref 0.00–0.07)
Basophils Absolute: 0 10*3/uL (ref 0.0–0.1)
Basophils Relative: 1 %
Eosinophils Absolute: 0.2 10*3/uL (ref 0.0–0.5)
Eosinophils Relative: 5 %
HCT: 39.9 % (ref 39.0–52.0)
Hemoglobin: 13.4 g/dL (ref 13.0–17.0)
Immature Granulocytes: 0 %
Lymphocytes Relative: 39 %
Lymphs Abs: 1.9 10*3/uL (ref 0.7–4.0)
MCH: 32.3 pg (ref 26.0–34.0)
MCHC: 33.6 g/dL (ref 30.0–36.0)
MCV: 96.1 fL (ref 80.0–100.0)
Monocytes Absolute: 0.7 10*3/uL (ref 0.1–1.0)
Monocytes Relative: 13 %
Neutro Abs: 2.1 10*3/uL (ref 1.7–7.7)
Neutrophils Relative %: 42 %
Platelets: 360 10*3/uL (ref 150–400)
RBC: 4.15 MIL/uL — ABNORMAL LOW (ref 4.22–5.81)
RDW: 12.6 % (ref 11.5–15.5)
WBC: 5 10*3/uL (ref 4.0–10.5)
nRBC: 0 % (ref 0.0–0.2)

## 2020-11-19 LAB — RESP PANEL BY RT-PCR (FLU A&B, COVID) ARPGX2
Influenza A by PCR: NEGATIVE
Influenza B by PCR: NEGATIVE
SARS Coronavirus 2 by RT PCR: NEGATIVE

## 2020-11-19 LAB — COMPREHENSIVE METABOLIC PANEL
ALT: 17 U/L (ref 0–44)
AST: 24 U/L (ref 15–41)
Albumin: 4 g/dL (ref 3.5–5.0)
Alkaline Phosphatase: 74 U/L (ref 38–126)
Anion gap: 10 (ref 5–15)
BUN: 9 mg/dL (ref 6–20)
CO2: 24 mmol/L (ref 22–32)
Calcium: 9.3 mg/dL (ref 8.9–10.3)
Chloride: 103 mmol/L (ref 98–111)
Creatinine, Ser: 0.94 mg/dL (ref 0.61–1.24)
GFR, Estimated: >60 mL/min (ref >60)
Glucose, Bld: 81 mg/dL (ref 70–99)
Potassium: 4.1 mmol/L (ref 3.5–5.1)
Sodium: 137 mmol/L (ref 135–145)
Total Bilirubin: 0.6 mg/dL (ref 0.3–1.2)
Total Protein: 7 g/dL (ref 6.5–8.1)

## 2020-11-19 LAB — POC SARS CORONAVIRUS 2 AG: SARSCOV2ONAVIRUS 2 AG: NEGATIVE

## 2020-11-19 MED ORDER — TRAZODONE HCL 50 MG PO TABS: 50.0000 mg | ORAL_TABLET | Freq: Every evening | ORAL | Status: DC | PRN

## 2020-11-19 MED ORDER — MIRTAZAPINE 15 MG PO TABS
15.0000 mg | ORAL_TABLET | Freq: Every day | ORAL | Status: DC
  Administered 2020-11-19 – 2020-11-23 (×5): 15 mg via ORAL
  Filled 2020-11-19 (×4): qty 1
  Filled 2020-11-19 (×2): qty 14
  Filled 2020-11-19: qty 1

## 2020-11-19 MED ORDER — MAGNESIUM HYDROXIDE 400 MG/5ML PO SUSP: 30.0000 mL | Freq: Every day | ORAL | Status: DC | PRN

## 2020-11-19 MED ORDER — DARUN-COBIC-EMTRICIT-TENOFAF 800-150-200-10 MG PO TABS
1.0000 | ORAL_TABLET | Freq: Every morning | ORAL | Status: DC
  Administered 2020-11-20 – 2020-11-24 (×5): 1 via ORAL
  Filled 2020-11-19 (×5): qty 1

## 2020-11-19 MED ORDER — ACETAMINOPHEN 325 MG PO TABS
650.0000 mg | ORAL_TABLET | Freq: Four times a day (QID) | ORAL | Status: DC | PRN
  Administered 2020-11-22: 650 mg via ORAL
  Filled 2020-11-19: qty 2

## 2020-11-19 MED ORDER — ALUM & MAG HYDROXIDE-SIMETH 200-200-20 MG/5ML PO SUSP: 30.0000 mL | ORAL | Status: DC | PRN

## 2020-11-19 MED ORDER — HYDROXYZINE HCL 25 MG PO TABS: 25.0000 mg | ORAL_TABLET | Freq: Three times a day (TID) | ORAL | Status: DC | PRN

## 2020-11-19 MED ORDER — TRAZODONE HCL 100 MG PO TABS
100.0000 mg | ORAL_TABLET | Freq: Every evening | ORAL | Status: DC | PRN
  Filled 2020-11-19: qty 14

## 2020-11-19 MED ORDER — QUETIAPINE FUMARATE 100 MG PO TABS
100.0000 mg | ORAL_TABLET | Freq: Every day | ORAL | Status: DC
  Administered 2020-11-19 – 2020-11-23 (×5): 100 mg via ORAL
  Filled 2020-11-19 (×2): qty 1
  Filled 2020-11-19: qty 14
  Filled 2020-11-19 (×3): qty 1

## 2020-11-19 MED ORDER — ALBUTEROL SULFATE HFA 108 (90 BASE) MCG/ACT IN AERS
2.0000 | INHALATION_SPRAY | RESPIRATORY_TRACT | Status: DC | PRN
  Administered 2020-11-19: 2 via RESPIRATORY_TRACT
  Filled 2020-11-19: qty 6.7
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
## 2020-11-19 NOTE — ED Provider Notes (Signed)
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 SARS-CoV-2/FLU/RSV testing.  Fact Sheet for Patients: EntrepreneurPulse.com.au  Fact Sheet for Healthcare  Providers: IncredibleEmployment.be  This test is not yet approved or cleared by the [place] FDA and has been authorized for detection and/or diagnosis of SARS-CoV-2 by FDA under an Emergency Use Authorization (EUA). This EUA will remain in effect (meaning this test can be used) for the duration of the COVID-19 declaration under Section 564(b)(1) of the Act, 21 U.S.C. section 360bbb-3(b)(1), unless the authorization is terminated or revoked.  Performed at [facility], Charles Mix [address]  [place], [place] [address]   Office Visit on 08/07/2020  Component Date Value Ref Range Status   Sed Rate 08/07/2020 6  0 - 15 mm/h Final   CRP 08/07/2020 4.3  <8.0 mg/L Final   HIV 1 RNA Quant 08/07/2020 Not Detected  Copies/mL Final   HIV-1 RNA Quant, Log 08/07/2020 Not Detected  Log cps/mL Final   Comment: . Reference Range:                           Not Detected     copies/mL                           Not Detected Log copies/mL . Marland Kitchen The test was performed using Real-Time Polymerase Chain Reaction. . . Reportable Range: 20 copies/mL to 10,000,000 copies/mL (1.30 Log copies/mL to 7.00 Log copies/mL). .   Office Visit on 07/10/2020  Component Date Value Ref Range Status   Glucose, Bld 07/10/2020 66  65 - 99 mg/dL Final   Comment: .            Fasting reference interval .    BUN 07/10/2020 14  7 - 25 mg/dL Final   Creat 07/10/2020 0.83  0.60 - 1.35 mg/dL Final   GFR, Est Non African American 07/10/2020 103  > OR = 60 mL/min/1.56m Final   GFR, Est African American 07/10/2020 120  > OR = 60 mL/min/1.753mFinal   BUN/Creatinine Ratio 0425/95/6387OT APPLICABLE  6 - 22 (calc) Final   Sodium 07/10/2020 139  135 - 146 mmol/L Final   Potassium 07/10/2020 4.2  3.5 - 5.3 mmol/L Final   Chloride 07/10/2020 102  98 - 110 mmol/L Final   CO2 07/10/2020 24  20 - 32 mmol/L Final   Calcium 07/10/2020 9.5  8.6 - 10.3 mg/dL Final   Total Protein 07/10/2020 7.4  6.1 - 8.1 g/dL  Final   Albumin 07/10/2020 4.8  3.6 - 5.1 g/dL Final   Globulin 07/10/2020 2.6  1.9 - 3.7 g/dL (calc) Final   AG Ratio 07/10/2020 1.8  1.0 - 2.5 (calc) Final   Total Bilirubin 07/10/2020 0.3  0.2 - 1.2 mg/dL Final   Alkaline phosphatase (APISO) 07/10/2020 76  36 - 130 U/L Final   AST 07/10/2020 29  10 - 40 U/L Final   ALT 07/10/2020 17  9 - 46 U/L Final   HIV 1 RNA Quant 07/10/2020 Not Detected  Copies/mL Final   HIV-1 RNA Quant, Log 07/10/2020 Not Detected  Log cps/mL Final   Comment: . Reference Range:                           Not Detected     copies/mL                           Not Detected Log copies/mL . . Marland Kitchenhe test was performed using Real-Time Polymerase Chain Reaction. . . Reportable Range: 20 copies/mL to 10,000,000 copies/mL (1.30 Log copies/mL to 7.00 Log copies/mL). .    CD4 T Cell Abs 07/10/2020 1,031  400 - 1,790 /uL Final   CD4 % Helper T Cell 07/10/2020 46  33 - 65 % Final   [facility] [place] [address]  Allergies: Peanut-containing drug products and Shellfish allergy  PTA Medications: (  Not in a hospital admission)   Medical Decision Making     50 year old male with a history of depression, malingering,  and polysubstance abuse who presents to the [facility] with worsening suicidal thoughts for the last 2-1/2 to 3 months.  Patient unable to contract for safety. He denies recent substance use but does appear to be intoxicated during assessment.  UDS in 09/2020 was + for cocaine and THC. Patient is agreeable to [facility] admission. Will obtain routine lab work and restart home medications.    Routine Labs ordered- CBC, CMP, ethanol, EKG, UDS, covid PCR, POC Covid    a1c (5.2 in 09/2020 is wnl ), lipid panel (09/2020 lipid panel wnl) recently obtained, will not redraw at this time. TSH  elevated (5.49 09/2020); however, free t3  2.2 (wnl).      MDD R/o SIMD/SIPD -restart remeron 15 mg qhs for mood -restart  seroquel 100 mg for mood as an adjunct -restart 100 mg trazodone prn qhs for insomnia   Anxiety -restart buspar 5 mg BID for anxiety   HIV -restart Symtuza 800-150-200-10 MG Tabs Generic drug: Darunavir-Cobicisctat-Emtricitabine-Tenofovir Alafenamide   Asthma -restart albuterol inhaler   Recommendations  Based on my evaluation the patient does not appear to have an emergency medical condition.  Ival Bible, MD 11/19/20  4:53 PM
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
## 2020-11-19 NOTE — Congregational Nurse Program (Signed)
  Dept: [name] Nurse Program Note  Date of Encounter: 11/19/2020  Past Medical History: Past Medical History:  Diagnosis Date   Alcohol abuse    Alcoholism ([place])    Anxiety    Asthma    Asthma    Bipolar disorder ([place])    Chronic low back pain    Cocaine abuse ([place])    Depression    Gout    Gout    HIV (human immunodeficiency virus infection) ([place])    "dx'd ~ 2 yr ago" (09/29/2012)   HIV (human immunodeficiency virus infection) ([place])    HIV disease ([place])    Homelessness    Hypertension    Marijuana abuse    Mental disorder    Schizophrenia ([place])     Encounter Detail   Client seen in [facility] lobby shaking and he had called 911 reporting si. Sat with client as he waited on help to arrive. Educated client on deep breathing technique and assisted in calming client. He reports si related to "court thing, disability and things in street." [facility] assistance arrived and took client to [facility] for evaluation Rockney Grenz W RN CN [phone]

## 2020-11-19 NOTE — BH Assessment (Signed)
Pt reporting having SI for the past month. Pt reports plan to "do whatever it takes, like jump off bridge". Reports multiple stressors. Pt has upcoming apportionment here on Monday. PT is urgent

## 2020-11-19 NOTE — ED Notes (Signed)
[facility], pt COVID Neg.

## 2020-11-19 NOTE — ED Notes (Signed)
STAT lab courier service called to transport specimens to [facility] lab.

## 2020-11-19 NOTE — ED Notes (Signed)
EKG and Labs collected.  Attempted to obtain UDS pt stated "I do not have to use the bathroom."  Will continue to monitor for safety.

## 2020-11-20 DIAGNOSIS — F332 Major depressive disorder, recurrent severe without psychotic features: Secondary | ICD-10-CM | POA: Diagnosis not present

## 2020-11-20 DIAGNOSIS — Z20822 Contact with and (suspected) exposure to covid-19: Secondary | ICD-10-CM | POA: Diagnosis not present

## 2020-11-20 DIAGNOSIS — B20 Human immunodeficiency virus [HIV] disease: Secondary | ICD-10-CM | POA: Diagnosis not present

## 2020-11-20 DIAGNOSIS — F419 Anxiety disorder, unspecified: Secondary | ICD-10-CM | POA: Diagnosis not present

## 2020-11-20 LAB — POCT URINE DRUG SCREEN - MANUAL ENTRY (I-SCREEN)
POC Amphetamine UR: NOT DETECTED
POC Buprenorphine (BUP): NOT DETECTED
POC Cocaine UR: POSITIVE — AB
POC Marijuana UR: POSITIVE — AB
POC Methadone UR: NOT DETECTED
POC Methamphetamine UR: NOT DETECTED
POC Morphine: NOT DETECTED
POC Oxazepam (BZO): NOT DETECTED
POC Oxycodone UR: NOT DETECTED
POC Secobarbital (BAR): NOT DETECTED

## 2020-11-20 LAB — POC SARS CORONAVIRUS 2 AG -  ED: SARS Coronavirus 2 Ag: NEGATIVE

## 2020-11-20 NOTE — Clinical Social Work Psych Note (Signed)
Cognitive Distortions & Restructuring  Cognitive Distortions & Cognitive Restructuring (CBT & DBT Skills)  Date: 11/20/20  Type of Therapy/Therapeutic Modalities:  Participation Level: Minimal  Objective: The purpose of this group is to discuss and assist patients in identifying cognitive distortions (negative thinking patterns) that can influence their emotions and behaviors. Facilitators will guide conversations that discuss how these cognitive distortions contribute to common disorders such as anxiety and depression.   Therapeutic Goals:  Patient will identify negative thinking patterns they currently experience and how those thoughts influence their behaviors.  Patient will begin to explore the possible misinformation and/or traumatic experiences that influence their negative thought patterns.  Patient will explore the foundations and techniques of cognitive restructuring by reviewing CBT and DBT techniques.  Patient will discuss the   Summary of Patient's Progress:  John [name] presented to the group session and was attentive intermittently throughout the group. John [name] fell asleep numerous times during the group session.   John [name] reviewed the CBT Model and is working on Advertising copywriter that challenge her to "Put Her Thoughts on Trial", "Challenging Negative Thinking Patterns" and utilizing the CBT model with real life examples.

## 2020-11-20 NOTE — ED Notes (Signed)
Pt asleep in bed. Respirations even and unlabored. Will continue to monitor for safety. ?

## 2020-11-20 NOTE — ED Notes (Addendum)
Pt laying in bed. A&O x4, calm and cooperative. Pt continues to report SI with plan "by any means necessary" and HI to "anyone, by any means necessary." Pt continues to report AVH about "seeing black spots and voices telling me to kill black spots." Pt is able to verbally contract for safety. Pt denies any immediate needs. No signs of acute distress noted. Will continue to monitor for safety.

## 2020-11-20 NOTE — Clinical Social Work Psych Note (Signed)
Per Dr. Serafina Mitchell, the patient is now agreeable to residential substance abuse treatment.   Dr. Serafina Mitchell informed John [name] that a urine sample was needed in order to process his referrals. Denario stated he would attempt to provide a sample next time he had to use the restroom.   CSW will continue to follow    Radonna Ricker, MSW, LCSW Clinical Social Worker ([place]) [facility]

## 2020-11-20 NOTE — ED Notes (Addendum)
Pt with audible wheezing after taking a shower. Pt states this is "normal" for him. PRN Albuterol given per pt request. Wheezing subsided after pt used inhaler. Will continue to monitor for safety.
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
## 2020-11-20 NOTE — ED Notes (Signed)
Pt provided urine sample to RN

## 2020-11-20 NOTE — ED Notes (Signed)
Pt eating lunch in no acute distress. Denies concerns at present. Safety maintained.

## 2020-11-20 NOTE — ED Notes (Signed)
D:  Patient A&Ox4. _Pt endorses SI /HI " towards anybody" and reports AVH , seeing black spots and voices telling him to kill the black spots . Patient denies any physical complaints when asked. No acute distress noted. . Pt is easily agitated during the assessment and reluctant to answer questions     A: Support and encouragement provided. Routine safety checks conducted according to facility protocol. Encouraged patient to notify staff if thoughts of harm toward self or others arise. Patient verbalize understanding and agreement.   R: Patient remains safe and patient verbally contracts for safety at this time. Will continue to monitor.

## 2020-11-20 NOTE — ED Notes (Signed)
Pt asleep at the onset of the shift , no distress noted

## 2020-11-20 NOTE — ED Provider Notes (Signed)
Behavioral Health Progress Note  Date and Time: 11/20/2020 4:31 PM Name: John [name] MRN:  [id number]  Subjective:   Original note by student Doctor Tobey Bride; edited by me as appropriate for completeness and accuracy.    Patient seen and chart reviewed. Patient continues to endorse active SI. He denies having a concrete plan, but says that "he will use anything in front of him". He also reports HI that is not directed at anyone in particular and does not have plans or intent. He reports having VH of "8 black spots" that he sees floating around. When he focuses in on one dot, it "grows bigger and then explodes". He endorses CAH with 3-4 distinct voices that tell him to "kill the black dots". He reports having these hallucinations for the past 2-3 months.. Patient reports that overall the hallucinations are improving and are "a slight bit better than yesterday"   He reports recent use of alcohol and "crack" cocaine. He endorses using both substances prior to his admission. He says that the alcohol muffles the voices and that the cocaine makes the voices stop until the effects of the substance wear off. He reports cannabis use and says that it makes him "feel happy". Patient acknowledges that his substance use is a problem and states that he had discussed this with his case worker who has also recommended rehab to him in the past. Patient is somewhat irritable this morning when discussing need for UDS as it would be helpful in facilitating rehab placement but is agreeable. UDS+THC and cocaine   On medical student interview, He says that he appreciates being on medication and that when he felt like this in the past, the medications he received worked. He does not remember what medications he received or what he was being treated for. When asked about plans after discharge, he says that he has not thought that about plans yet. He reports not having people outside of the hospital to talk to and no close friends.  He is married, but avoids contact with his husband. He says that he enjoys birdwatching at the park, reading, and doing crossword puzzles. He watches birds at the park daily and says that this brings him joy.    Diagnosis:  Final diagnoses:  Human immunodeficiency virus (HIV) disease ([place])  Severe episode of recurrent major depressive disorder, without psychotic features ([place])    Total Time spent with patient: 15 minutes  Past Psychiatric History: see H&P Past Medical History:  Past Medical History:  Diagnosis Date   Alcohol abuse    Alcoholism ([place])    Anxiety    Asthma    Asthma    Bipolar disorder ([place])    Chronic low back pain    Cocaine abuse ([place])    Depression    Gout    Gout    HIV (human immunodeficiency virus infection) ([place])    "dx'd ~ 2 yr ago" (09/29/2012)   HIV (human immunodeficiency virus infection) ([place])    HIV disease ([place])    Homelessness    Hypertension    Marijuana abuse    Mental disorder    Schizophrenia ([place])     Past Surgical History:  Procedure Laterality Date   SKIN GRAFT FULL THICKNESS LEG Left ?   POSTERIOR LEFT LEG  AFTER DOG BITES   Family History:  Family History  Problem Relation Age of Onset   Alcoholism Mother    Depression Mother    Alcoholism Brother    Family Psychiatric  History: see H&P Social History:  Social History   Substance and Sexual Activity  Alcohol Use Yes   Alcohol/week: 12.0 standard drinks   Types: 12 Cans of beer per week   Comment: Daily. "As much as I can get."     Social History   Substance and Sexual Activity  Drug Use Yes   Types: Marijuana, "Crack" cocaine   Comment: States been drinking 4-40oz beers daily.    Social History   Socioeconomic History   Marital status: Single    Spouse name: Not on file   Number of children: 0   Years of education: Not on file   Highest education level: 9th grade  Occupational History   Occupation: unemployed  Tobacco Use   Smoking status: Every Day     Packs/day: 0.50    Years: 27.00    Pack years: 13.50    Types: Cigarettes   Smokeless tobacco: Never  Vaping Use   Vaping Use: Never used  Substance and Sexual Activity   Alcohol use: Yes    Alcohol/week: 12.0 standard drinks    Types: 12 Cans of beer per week    Comment: Daily. "As much as I can get."   Drug use: Yes    Types: Marijuana, "Crack" cocaine    Comment: States been drinking 4-40oz beers daily.   Sexual activity: Not Currently    Partners: Male    Birth control/protection: Condom    Comment: given condoms 04/10/20  Other Topics Concern   Not on file  Social History Narrative   ** Merged History Encounter **       ** Merged History Encounter **       Social Determinants of Health   Financial Resource Strain: High Risk   Difficulty of Paying Living Expenses: Very hard  Food Insecurity: Landscape architect Present   Worried About Charity fundraiser in the Last Year: Sometimes true   Arboriculturist in the Last Year: Sometimes true  Transportation Needs: Public librarian (Medical): Yes   Lack of Transportation (Non-Medical): Yes  Physical Activity: Inactive   Days of Exercise per Week: 0 days   Minutes of Exercise per Session: 0 min  Stress: Stress Concern Present   Feeling of Stress : Rather much  Social Connections: Moderately Isolated   Frequency of Communication with Friends and Family: Three times a week   Frequency of Social Gatherings with Friends and Family: Three times a week   Attends Religious Services: Never   Active Member of Clubs or Organizations: Yes   Attends Music therapist: More than 4 times per year   Marital Status: Never married   SDOH:  SDOH Screenings   Alcohol Screen: Medium Risk   Last Alcohol Screening Score (AUDIT): 31  Depression (PHQ2-9): Low Risk    PHQ-2 Score: 0  Financial Resource Strain: High Risk   Difficulty of Paying Living Expenses: Very hard  Food Insecurity: Food  Insecurity Present   Worried About Charity fundraiser in the Last Year: Sometimes true   Ran Out of Food in the Last Year: Sometimes true  Housing: Medium Risk   Last Housing Risk Score: 1  Physical Activity: Inactive   Days of Exercise per Week: 0 days   Minutes of Exercise per Session: 0 min  Social Connections: Moderately Isolated   Frequency of Communication with Friends and Family: Three times a week   Frequency of Social Gatherings with  Friends and Family: Three times a week   Attends Religious Services: Never   Active Member of Clubs or Organizations: Yes   Attends Music therapist: More than 4 times per year   Marital Status: Never married  Stress: Stress Concern Present   Feeling of Stress : Rather much  Tobacco Use: High Risk   Smoking Tobacco Use: Every Day   Smokeless Tobacco Use: Never  Transportation Needs: Unmet Transportation Needs   Lack of Transportation (Medical): Yes   Lack of Transportation (Non-Medical): Yes   Additional Social History:    Pain Medications: See MAR Prescriptions: See MAR Over the Counter: See MAR History of alcohol / drug use?: Yes Longest period of sobriety (when/how long): 2 days Negative Consequences of Use: Personal relationships, Financial Withdrawal Symptoms: Agitation                    Sleep: Fair  Appetite:  Fair  Current Medications:  Current Facility-Administered Medications  Medication Dose Route Frequency Provider Last Rate Last Admin   acetaminophen (TYLENOL) tablet 650 mg  650 mg Oral Q6H PRN Ival Bible, MD       albuterol (VENTOLIN HFA) 108 (90 Base) MCG/ACT inhaler 2 puff  2 puff Inhalation Q4H PRN Ival Bible, MD   2 puff at 11/19/20 2053   alum & mag hydroxide-simeth (MAALOX/MYLANTA) 200-200-20 MG/5ML suspension 30 mL  30 mL Oral Q4H PRN Ival Bible, MD       Darunavir-Cobicistat-Emtricitabine-Tenofovir Alafenamide (SYMTUZA) 800-150-200-10 MG TABS 1 tablet  1 tablet  Oral q morning Ival Bible, MD   1 tablet at 11/20/20 1139   hydrOXYzine (ATARAX/VISTARIL) tablet 25 mg  25 mg Oral TID PRN Ival Bible, MD       magnesium hydroxide (MILK OF MAGNESIA) suspension 30 mL  30 mL Oral Daily PRN Ival Bible, MD       mirtazapine (REMERON) tablet 15 mg  15 mg Oral QHS Ival Bible, MD   15 mg at 11/19/20 2101   QUEtiapine (SEROQUEL) tablet 100 mg  100 mg Oral QHS Ival Bible, MD   100 mg at 11/19/20 2100   traZODone (DESYREL) tablet 100 mg  100 mg Oral QHS PRN Ival Bible, MD       Current Outpatient Medications  Medication Sig Dispense Refill   albuterol (VENTOLIN HFA) 108 (90 Base) MCG/ACT inhaler INHALE 2 PUFFS INTO THE LUNGS EVERY 4 HOURS AS NEEDED FOR WHEEZING OR SHORTNESS OF BREATH (Patient taking differently: Inhale 2 puffs into the lungs every 4 (four) hours as needed for shortness of breath.) 18 g 0   mirtazapine (REMERON) 30 MG tablet Take 30 mg by mouth at bedtime as needed (For sleep).     QUEtiapine (SEROQUEL) 100 MG tablet Take 100 mg by mouth at bedtime.     SYMTUZA 800-150-200-10 MG TABS Take 1 tablet by mouth every morning. 30 tablet 4   traZODone (DESYREL) 100 MG tablet Take 100 mg by mouth at bedtime as needed for sleep.      Labs  Lab Results:  Admission on 11/19/2020  Component Date Value Ref Range Status   SARS Coronavirus 2 by RT PCR 11/19/2020 NEGATIVE  NEGATIVE Final   Comment: (NOTE) SARS-CoV-2 target nucleic acids are NOT DETECTED.  The SARS-CoV-2 RNA is generally detectable in upper respiratory specimens during the acute phase of infection. The lowest concentration of SARS-CoV-2 viral copies this assay can detect is 138 copies/mL. A  negative result does not preclude SARS-Cov-2 infection and should not be used as the sole basis for treatment or other patient management decisions. A negative result may occur with  improper specimen collection/handling, submission of specimen  other than nasopharyngeal swab, presence of viral mutation(s) within the areas targeted by this assay, and inadequate number of viral copies(<138 copies/mL). A negative result must be combined with clinical observations, patient history, and epidemiological information. The expected result is Negative.  Fact Sheet for Patients:  EntrepreneurPulse.com.au  Fact Sheet for Healthcare Providers:  IncredibleEmployment.be  This test is no                          t yet approved or cleared by the [place] FDA and  has been authorized for detection and/or diagnosis of SARS-CoV-2 by FDA under an Emergency Use Authorization (EUA). This EUA will remain  in effect (meaning this test can be used) for the duration of the COVID-19 declaration under Section 564(b)(1) of the Act, 21 U.S.C.section 360bbb-3(b)(1), unless the authorization is terminated  or revoked sooner.       Influenza A by PCR 11/19/2020 NEGATIVE  NEGATIVE Final   Influenza B by PCR 11/19/2020 NEGATIVE  NEGATIVE Final   Comment: (NOTE) The Xpert Xpress SARS-CoV-2/FLU/RSV plus assay is intended as an aid in the diagnosis of influenza from Nasopharyngeal swab specimens and should not be used as a sole basis for treatment. Nasal washings and aspirates are unacceptable for Xpert Xpress SARS-CoV-2/FLU/RSV testing.  Fact Sheet for Patients: EntrepreneurPulse.com.au  Fact Sheet for Healthcare Providers: IncredibleEmployment.be  This test is not yet approved or cleared by the [place] FDA and has been authorized for detection and/or diagnosis of SARS-CoV-2 by FDA under an Emergency Use Authorization (EUA). This EUA will remain in effect (meaning this test can be used) for the duration of the COVID-19 declaration under Section 564(b)(1) of the Act, 21 U.S.C. section 360bbb-3(b)(1), unless the authorization is terminated or revoked.  Performed at  [facility], [place] [address], [place], [place] [address]    WBC 11/19/2020 5.0  4.0 - 10.5 K/uL Final   RBC 11/19/2020 4.15 (A) 4.22 - 5.81 MIL/uL Final   Hemoglobin 11/19/2020 13.4  13.0 - 17.0 g/dL Final   HCT 11/19/2020 39.9  39.0 - 52.0 % Final   MCV 11/19/2020 96.1  80.0 - 100.0 fL Final   MCH 11/19/2020 32.3  26.0 - 34.0 pg Final   MCHC 11/19/2020 33.6  30.0 - 36.0 g/dL Final   RDW 11/19/2020 12.6  11.5 - 15.5 % Final   Platelets 11/19/2020 360  150 - 400 K/uL Final   nRBC 11/19/2020 0.0  0.0 - 0.2 % Final   Neutrophils Relative % 11/19/2020 42  % Final   Neutro Abs 11/19/2020 2.1  1.7 - 7.7 K/uL Final   Lymphocytes Relative 11/19/2020 39  % Final   Lymphs Abs 11/19/2020 1.9  0.7 - 4.0 K/uL Final   Monocytes Relative 11/19/2020 13  % Final   Monocytes Absolute 11/19/2020 0.7  0.1 - 1.0 K/uL Final   Eosinophils Relative 11/19/2020 5  % Final   Eosinophils Absolute 11/19/2020 0.2  0.0 - 0.5 K/uL Final   Basophils Relative 11/19/2020 1  % Final   Basophils Absolute 11/19/2020 0.0  0.0 - 0.1 K/uL Final   Immature Granulocytes 11/19/2020 0  % Final   Abs Immature Granulocytes 11/19/2020 0.02  0.00 - 0.07 K/uL Final   Performed  at [facility], [place] [address], [place], [place] [address]   Sodium 11/19/2020 137  135 - 145 mmol/L Final   Potassium 11/19/2020 4.1  3.5 - 5.1 mmol/L Final   Chloride 11/19/2020 103  98 - 111 mmol/L Final   CO2 11/19/2020 24  22 - 32 mmol/L Final   Glucose, Bld 11/19/2020 81  70 - 99 mg/dL Final   Glucose reference range applies only to samples taken after fasting for at least 8 hours.   BUN 11/19/2020 9  6 - 20 mg/dL Final   Creatinine, Ser 11/19/2020 0.94  0.61 - 1.24 mg/dL Final   Calcium 11/19/2020 9.3  8.9 - 10.3 mg/dL Final   Total Protein 11/19/2020 7.0  6.5 - 8.1 g/dL Final   Albumin 11/19/2020 4.0  3.5 - 5.0 g/dL Final   AST 11/19/2020 24  15 - 41 U/L Final   ALT 11/19/2020 17  0 - 44 U/L Final   Alkaline Phosphatase 11/19/2020 74   38 - 126 U/L Final   Total Bilirubin 11/19/2020 0.6  0.3 - 1.2 mg/dL Final   GFR, Estimated 11/19/2020 >60  >60 mL/min Final   Comment: (NOTE) Calculated using the CKD-EPI Creatinine Equation (2021)    Anion gap 11/19/2020 10  5 - 15 Final   Performed at [facility] [address], [place], [place] [address]   Alcohol, Ethyl (B) 11/19/2020 <10  <10 mg/dL Final   Comment: (NOTE) Lowest detectable limit for serum alcohol is 10 mg/dL.  For medical purposes only. Performed at [facility], [place] [address], [place], [place] [address]    POC Amphetamine UR 11/20/2020 None Detected  NONE DETECTED (Cut Off Level 1000 ng/mL) Final   POC Secobarbital (BAR) 11/20/2020 None Detected  NONE DETECTED (Cut Off Level 300 ng/mL) Final   POC Buprenorphine (BUP) 11/20/2020 None Detected  NONE DETECTED (Cut Off Level 10 ng/mL) Final   POC Oxazepam (BZO) 11/20/2020 None Detected  NONE DETECTED (Cut Off Level 300 ng/mL) Final   POC Cocaine UR 11/20/2020 Positive (A) NONE DETECTED (Cut Off Level 300 ng/mL) Final   POC Methamphetamine UR 11/20/2020 None Detected  NONE DETECTED (Cut Off Level 1000 ng/mL) Final   POC Morphine 11/20/2020 None Detected  NONE DETECTED (Cut Off Level 300 ng/mL) Final   POC Oxycodone UR 11/20/2020 None Detected  NONE DETECTED (Cut Off Level 100 ng/mL) Final   POC Methadone UR 11/20/2020 None Detected  NONE DETECTED (Cut Off Level 300 ng/mL) Final   POC Marijuana UR 11/20/2020 Positive (A) NONE DETECTED (Cut Off Level 50 ng/mL) Final   SARS Coronavirus 2 Ag 11/19/2020 Negative  Negative Final   SARSCOV2ONAVIRUS 2 AG 11/19/2020 NEGATIVE  NEGATIVE Final   Comment: (NOTE) SARS-CoV-2 antigen NOT DETECTED.   Negative results are presumptive.  Negative results do not preclude SARS-CoV-2 infection and should not be used as the sole basis for treatment or other patient management decisions, including infection  control decisions, particularly in the presence of clinical signs  and  symptoms consistent with COVID-19, or in those who have been in contact with the virus.  Negative results must be combined with clinical observations, patient history, and epidemiological information. The expected result is Negative.  Fact Sheet for Patients: HandmadeRecipes.com.cy  Fact Sheet for Healthcare Providers: FuneralLife.at  This test is not yet approved or cleared by the [place] FDA and  has been authorized for detection and/or diagnosis of SARS-CoV-2 by FDA under an Emergency Use Authorization (  EUA).  This EUA will remain in effect (meaning this test can be used) for the duration of  the COV                          ID-19 declaration under Section 564(b)(1) of the Act, 21 U.S.C. section 360bbb-3(b)(1), unless the authorization is terminated or revoked sooner.    Office Visit on 10/27/2020  Component Date Value Ref Range Status   Glucose, Bld 10/27/2020 87  65 - 99 mg/dL Final   Comment: .            Fasting reference interval .    BUN 10/27/2020 15  7 - 25 mg/dL Final   Creat 10/27/2020 0.98  0.60 - 1.29 mg/dL Final   eGFR 10/27/2020 95  > OR = 60 mL/min/1.84m Final   Comment: The eGFR is based on the CKD-EPI 2021 equation. To calculate  the new eGFR from a previous Creatinine or Cystatin C result, go to https://www.kidney.org/professionals/ kdoqi/gfr%5Fcalculator    BUN/Creatinine Ratio 016/38/4536NOT APPLICABLE  6 - 22 (calc) Final   Sodium 10/27/2020 142  135 - 146 mmol/L Final   Potassium 10/27/2020 4.3  3.5 - 5.3 mmol/L Final   Chloride 10/27/2020 104  98 - 110 mmol/L Final   CO2 10/27/2020 29  20 - 32 mmol/L Final   Calcium 10/27/2020 9.6  8.6 - 10.3 mg/dL Final   Total Protein 10/27/2020 7.3  6.1 - 8.1 g/dL Final   Albumin 10/27/2020 4.3  3.6 - 5.1 g/dL Final   Globulin 10/27/2020 3.0  1.9 - 3.7 g/dL (calc) Final   AG Ratio 10/27/2020 1.4  1.0 - 2.5 (calc) Final   Total Bilirubin 10/27/2020 0.6   0.2 - 1.2 mg/dL Final   Alkaline phosphatase (APISO) 10/27/2020 76  36 - 130 U/L Final   AST 10/27/2020 15  10 - 40 U/L Final   ALT 10/27/2020 11  9 - 46 U/L Final   HIV 1 RNA Quant 10/27/2020 Not Detected  Copies/mL Final   HIV-1 RNA Quant, Log 10/27/2020 Not Detected  Log cps/mL Final   Comment: . Reference Range:                           Not Detected     copies/mL                           Not Detected Log copies/mL . .Marland KitchenThe test was performed using Real-Time Polymerase Chain Reaction. . . Reportable Range: 20 copies/mL to 10,000,000 copies/mL (1.30 Log copies/mL to 7.00 Log copies/mL). .    CD4 T Cell Abs 10/27/2020 462  400 - 1,790 /uL Final   CD4 % Helper T Cell 10/27/2020 44  33 - 65 % Final   [facility] [address], [place] [address]  RPR Ser Ql 10/27/2020 NON-REACTIVE  NON-REACTIVE Final  Admission on 09/17/2020, Discharged on 09/23/2020  Component Date Value Ref Range Status   Opiates 09/18/2020 NONE DETECTED  NONE DETECTED Final   Cocaine 09/18/2020 POSITIVE (A) NONE DETECTED Final   Benzodiazepines 09/18/2020 NONE DETECTED  NONE DETECTED Final   Amphetamines 09/18/2020 NONE DETECTED  NONE DETECTED Final   Tetrahydrocannabinol 09/18/2020 POSITIVE (A) NONE DETECTED Final   Barbiturates 09/18/2020 NONE DETECTED  NONE DETECTED Final   Comment: (NOTE) DRUG SCREEN FOR MEDICAL PURPOSES ONLY.  IF CONFIRMATION IS NEEDED FOR ANY PURPOSE, NOTIFY LAB WITHIN 5 DAYS.  LOWEST DETECTABLE LIMITS FOR URINE DRUG SCREEN Drug Class                     Cutoff (ng/mL) Amphetamine and metabolites    1000 Barbiturate and metabolites    200 Benzodiazepine                 655 Tricyclics and metabolites     300 Opiates and metabolites        300 Cocaine and metabolites        300 THC                            50 Performed at [facility], [place] [address], [place], [place] [address]    Cholesterol 09/18/2020 194  0 -  200 mg/dL Final   Triglycerides 09/18/2020 86  <150 mg/dL Final   HDL 09/18/2020 83  >40 mg/dL Final   Total CHOL/HDL Ratio 09/18/2020 2.3  RATIO Final   VLDL 09/18/2020 17  0 - 40 mg/dL Final   LDL Cholesterol 09/18/2020 94  0 - 99 mg/dL Final   Comment:        Total Cholesterol/HDL:CHD Risk Coronary Heart Disease Risk Table                     Men   Women  1/2 Average Risk   3.4   3.3  Average Risk       5.0   4.4  2 X Average Risk   9.6   7.1  3 X Average Risk  23.4   11.0        Use the calculated Patient Ratio above and the CHD Risk Table to determine the patient's CHD Risk.        ATP III CLASSIFICATION (LDL):  <100     mg/dL   Optimal  100-129  mg/dL   Near or Above                    Optimal  130-159  mg/dL   Borderline  160-189  mg/dL   High  >190     mg/dL   Very High Performed at [facility] [address], [place], [place] [address]    TSH 09/18/2020 5.149 (A) 0.350 - 4.500 uIU/mL Final   Comment: Performed by a 3rd Generation assay with a functional sensitivity of <=0.01 uIU/mL. Performed at [facility], [place] [address], [place], [place] [address]    Hgb A1c MFr Bld 09/18/2020 5.2  4.8 - 5.6 % Final   Comment: (NOTE) Pre diabetes:          5.7%-6.4%  Diabetes:              >6.4%  Glycemic control for   <7.0% adults with diabetes    Mean Plasma Glucose 09/18/2020 102.54  mg/dL Final   Performed at [facility], [place] [address], [place], [place] [address]   Free T4 09/18/2020 0.69  0.61 - 1.12 ng/dL Final   Comment: (NOTE) Biotin ingestion may interfere with free T4 tests. If the results are inconsistent with the TSH level, previous test results, or the clinical presentation, then consider biotin interference. If needed, order repeat testing after stopping biotin. Performed at [facility], [place] [address], [place], [place] [address]  T3, Free 09/21/2020 2.2  2.0 - 4.4 pg/mL Final   Comment:  (NOTE) Performed At: [facility] [place], [place] [id number] Rush Farmer MD UE:[id number]   Admission on 09/16/2020, Discharged on 09/17/2020  Component Date Value Ref Range Status   SARS Coronavirus 2 by RT PCR 09/16/2020 NEGATIVE  NEGATIVE Final   Comment: (NOTE) SARS-CoV-2 target nucleic acids are NOT DETECTED.  The SARS-CoV-2 RNA is generally detectable in upper respiratory specimens during the acute phase of infection. The lowest concentration of SARS-CoV-2 viral copies this assay can detect is 138 copies/mL. A negative result does not preclude SARS-Cov-2 infection and should not be used as the sole basis for treatment or other patient management decisions. A negative result may occur with  improper specimen collection/handling, submission of specimen other than nasopharyngeal swab, presence of viral mutation(s) within the areas targeted by this assay, and inadequate number of viral copies(<138 copies/mL). A negative result must be combined with clinical observations, patient history, and epidemiological information. The expected result is Negative.  Fact Sheet for Patients:  EntrepreneurPulse.com.au  Fact Sheet for Healthcare Providers:  IncredibleEmployment.be  This test is no                          t yet approved or cleared by the [place] FDA and  has been authorized for detection and/or diagnosis of SARS-CoV-2 by FDA under an Emergency Use Authorization (EUA). This EUA will remain  in effect (meaning this test can be used) for the duration of the COVID-19 declaration under Section 564(b)(1) of the Act, 21 U.S.C.section 360bbb-3(b)(1), unless the authorization is terminated  or revoked sooner.       Influenza A by PCR 09/16/2020 NEGATIVE  NEGATIVE Final   Influenza B by PCR 09/16/2020 NEGATIVE  NEGATIVE Final   Comment: (NOTE) The Xpert Xpress SARS-CoV-2/FLU/RSV plus assay is intended as an  aid in the diagnosis of influenza from Nasopharyngeal swab specimens and should not be used as a sole basis for treatment. Nasal washings and aspirates are unacceptable for Xpert Xpress SARS-CoV-2/FLU/RSV testing.  Fact Sheet for Patients: EntrepreneurPulse.com.au  Fact Sheet for Healthcare Providers: IncredibleEmployment.be  This test is not yet approved or cleared by the [place] FDA and has been authorized for detection and/or diagnosis of SARS-CoV-2 by FDA under an Emergency Use Authorization (EUA). This EUA will remain in effect (meaning this test can be used) for the duration of the COVID-19 declaration under Section 564(b)(1) of the Act, 21 U.S.C. section 360bbb-3(b)(1), unless the authorization is terminated or revoked.  Performed at [facility], [place] [address], [place], [place] [address]    Sodium 09/16/2020 139  135 - 145 mmol/L Final   Potassium 09/16/2020 4.1  3.5 - 5.1 mmol/L Final   Chloride 09/16/2020 104  98 - 111 mmol/L Final   CO2 09/16/2020 24  22 - 32 mmol/L Final   Glucose, Bld 09/16/2020 60 (A) 70 - 99 mg/dL Final   Glucose reference range applies only to samples taken after fasting for at least 8 hours.   BUN 09/16/2020 13  6 - 20 mg/dL Final   Creatinine, Ser 09/16/2020 1.18  0.61 - 1.24 mg/dL Final   Calcium 09/16/2020 9.4  8.9 - 10.3 mg/dL Final   Total Protein 09/16/2020 7.1  6.5 - 8.1 g/dL Final   Albumin 09/16/2020 4.0  3.5 - 5.0 g/dL Final   AST 09/16/2020 23  15 - 41 U/L Final  ALT 09/16/2020 17  0 - 44 U/L Final   Alkaline Phosphatase 09/16/2020 91  38 - 126 U/L Final   Total Bilirubin 09/16/2020 0.4  0.3 - 1.2 mg/dL Final   GFR, Estimated 09/16/2020 >60  >60 mL/min Final   Comment: (NOTE) Calculated using the CKD-EPI Creatinine Equation (2021)    Anion gap 09/16/2020 11  5 - 15 Final   Performed at [facility] [address], [place], [place] [address]   Alcohol, Ethyl (B) 09/16/2020  <10  <10 mg/dL Final   Comment: (NOTE) Lowest detectable limit for serum alcohol is 10 mg/dL.  For medical purposes only. Performed at [facility], [place] [address], [place], [place] [address]    WBC 09/16/2020 4.0  4.0 - 10.5 K/uL Final   RBC 09/16/2020 4.75  4.22 - 5.81 MIL/uL Final   Hemoglobin 09/16/2020 15.6  13.0 - 17.0 g/dL Final   HCT 09/16/2020 47.7  39.0 - 52.0 % Final   MCV 09/16/2020 100.4 (A) 80.0 - 100.0 fL Final   MCH 09/16/2020 32.8  26.0 - 34.0 pg Final   MCHC 09/16/2020 32.7  30.0 - 36.0 g/dL Final   RDW 09/16/2020 12.6  11.5 - 15.5 % Final   Platelets 09/16/2020 405 (A) 150 - 400 K/uL Final   nRBC 09/16/2020 0.0  0.0 - 0.2 % Final   Neutrophils Relative % 09/16/2020 18  % Final   Neutro Abs 09/16/2020 0.7 (A) 1.7 - 7.7 K/uL Final   Lymphocytes Relative 09/16/2020 59  % Final   Lymphs Abs 09/16/2020 2.4  0.7 - 4.0 K/uL Final   Monocytes Relative 09/16/2020 8  % Final   Monocytes Absolute 09/16/2020 0.3  0.1 - 1.0 K/uL Final   Eosinophils Relative 09/16/2020 12  % Final   Eosinophils Absolute 09/16/2020 0.5  0.0 - 0.5 K/uL Final   Basophils Relative 09/16/2020 3  % Final   Basophils Absolute 09/16/2020 0.1  0.0 - 0.1 K/uL Final   WBC Morphology 09/16/2020 See Note   Final   Smudge Cells   nRBC 09/16/2020 0  0 /100 WBC Final   Abs Immature Granulocytes 09/16/2020 0.00  0.00 - 0.07 K/uL Final   Performed at [facility], [place] [address], [place], [place] [address]   Path Review 09/16/2020 NEUTROPENIA   Final   Comment: Reviewed by Chrystie Nose. Saralyn Pilar, M.D. 09/17/2020 Performed at [facility], [place] [address], [place], [place] [address]    Glucose-Capillary 09/16/2020 107 (A) 70 - 99 mg/dL Final   Glucose reference range applies only to samples taken after fasting for at least 8 hours.  Admission on 08/20/2020, Discharged on 08/21/2020  Component Date Value Ref Range Status   SARS Coronavirus 2 by RT PCR 08/20/2020 POSITIVE (A) NEGATIVE Final    Comment: RESULT CALLED TO, READ BACK BY AND VERIFIED WITH: RN JASMINE J. [id number] FCP (NOTE) SARS-CoV-2 target nucleic acids are DETECTED.  The SARS-CoV-2 RNA is generally detectable in upper respiratory specimens during the acute phase of infection. Positive results are indicative of the presence of the identified virus, but do not rule out bacterial infection or co-infection with other pathogens not detected by the test. Clinical correlation with patient history and other diagnostic information is necessary to determine patient infection status. The expected result is Negative.  Fact Sheet for Patients: EntrepreneurPulse.com.au  Fact Sheet for Healthcare Providers: IncredibleEmployment.be  This test is not yet approved or cleared by the [place] FDA  and  has been authorized for detection and/or diagnosis of SARS-CoV-2 by FDA under an Emergency Use Authorization (EUA).  This EUA will remain in effect (meaning this test can be use                          d) for the duration of  the COVID-19 declaration under Section 564(b)(1) of the Act, 21 U.S.C. section 360bbb-3(b)(1), unless the authorization is terminated or revoked sooner.     Influenza A by PCR 08/20/2020 NEGATIVE  NEGATIVE Final   Influenza B by PCR 08/20/2020 NEGATIVE  NEGATIVE Final   Comment: (NOTE) The Xpert Xpress SARS-CoV-2/FLU/RSV plus assay is intended as an aid in the diagnosis of influenza from Nasopharyngeal swab specimens and should not be used as a sole basis for treatment. Nasal washings and aspirates are unacceptable for Xpert Xpress SARS-CoV-2/FLU/RSV testing.  Fact Sheet for Patients: EntrepreneurPulse.com.au  Fact Sheet for Healthcare Providers: IncredibleEmployment.be  This test is not yet approved or cleared by the [place] FDA and has been authorized for detection and/or diagnosis of SARS-CoV-2 by FDA under an  Emergency Use Authorization (EUA). This EUA will remain in effect (meaning this test can be used) for the duration of the COVID-19 declaration under Section 564(b)(1) of the Act, 21 U.S.C. section 360bbb-3(b)(1), unless the authorization is terminated or revoked.  Performed at [facility], [place] [address], [place], [place] [address]   Office Visit on 08/07/2020  Component Date Value Ref Range Status   Sed Rate 08/07/2020 6  0 - 15 mm/h Final   CRP 08/07/2020 4.3  <8.0 mg/L Final   HIV 1 RNA Quant 08/07/2020 Not Detected  Copies/mL Final   HIV-1 RNA Quant, Log 08/07/2020 Not Detected  Log cps/mL Final   Comment: . Reference Range:                           Not Detected     copies/mL                           Not Detected Log copies/mL . Marland Kitchen The test was performed using Real-Time Polymerase Chain Reaction. . . Reportable Range: 20 copies/mL to 10,000,000 copies/mL (1.30 Log copies/mL to 7.00 Log copies/mL). .   Office Visit on 07/10/2020  Component Date Value Ref Range Status   Glucose, Bld 07/10/2020 66  65 - 99 mg/dL Final   Comment: .            Fasting reference interval .    BUN 07/10/2020 14  7 - 25 mg/dL Final   Creat 07/10/2020 0.83  0.60 - 1.35 mg/dL Final   GFR, Est Non African American 07/10/2020 103  > OR = 60 mL/min/1.53m Final   GFR, Est African American 07/10/2020 120  > OR = 60 mL/min/1.743mFinal   BUN/Creatinine Ratio 0422/02/5427OT APPLICABLE  6 - 22 (calc) Final   Sodium 07/10/2020 139  135 - 146 mmol/L Final   Potassium 07/10/2020 4.2  3.5 - 5.3 mmol/L Final   Chloride 07/10/2020 102  98 - 110 mmol/L Final   CO2 07/10/2020 24  20 - 32 mmol/L Final   Calcium 07/10/2020 9.5  8.6 - 10.3 mg/dL Final   Total Protein 07/10/2020 7.4  6.1 - 8.1 g/dL Final   Albumin 07/10/2020 4.8  3.6 - 5.1 g/dL Final   Globulin 07/10/2020 2.6  1.9 - 3.7 g/dL (calc) Final   AG Ratio 07/10/2020 1.8  1.0 - 2.5 (calc) Final   Total Bilirubin 07/10/2020 0.3  0.2 - 1.2  mg/dL Final   Alkaline phosphatase (APISO) 07/10/2020 76  36 - 130 U/L Final   AST 07/10/2020 29  10 - 40 U/L Final   ALT 07/10/2020 17  9 - 46 U/L Final   HIV 1 RNA Quant 07/10/2020 Not Detected  Copies/mL Final   HIV-1 RNA Quant, Log 07/10/2020 Not Detected  Log cps/mL Final   Comment: . Reference Range:                           Not Detected     copies/mL                           Not Detected Log copies/mL . Marland Kitchen The test was performed using Real-Time Polymerase Chain Reaction. . . Reportable Range: 20 copies/mL to 10,000,000 copies/mL (1.30 Log copies/mL to 7.00 Log copies/mL). .    CD4 T Cell Abs 07/10/2020 1,031  400 - 1,790 /uL Final   CD4 % Helper T Cell 07/10/2020 46  33 - 65 % Final   Performed at [facility], [place] [address], [place], [place] [address]    Blood Alcohol level:  Lab Results  Component Value Date   ETH <10 11/19/2020   ETH <10 33/54/5625    Metabolic Disorder Labs: Lab Results  Component Value Date   HGBA1C 5.2 09/18/2020   MPG 102.54 09/18/2020   MPG 96.8 03/24/2019   Lab Results  Component Value Date   PROLACTIN 6.7 09/29/2012   Lab Results  Component Value Date   CHOL 194 09/18/2020   TRIG 86 09/18/2020   HDL 83 09/18/2020   CHOLHDL 2.3 09/18/2020   VLDL 17 09/18/2020   LDLCALC 94 09/18/2020   LDLCALC 60 04/10/2020    Therapeutic Lab Levels: No results found for: LITHIUM Lab Results  Component Value Date   VALPROATE 59 04/06/2015   VALPROATE 95 11/18/2014   No components found for:  CBMZ  Physical Findings   AIMS    Flowsheet Row Admission (Discharged) from 09/17/2020 in [place] [facility] Admission (Discharged) from 03/23/2019 in [place] [facility] Admission (Discharged) from 03/31/2015 in [place] [facility] Admission (Discharged) from 11/14/2014 in [place] Total Score 0 0 0 0      AUDIT     Flowsheet Row Admission (Discharged) from 09/17/2020 in [place] [facility] Counselor from 05/22/2020 in [facility] Admission (Discharged) from 03/23/2019 in [place] [facility] Admission (Discharged) from 03/31/2015 in [facility] [facility] Admission (Discharged) from 11/14/2014 in [place]  Alcohol Use Disorder Identification Test Final Score (AUDIT) 31 32 '28 12 12      ' GAD-7    Flowsheet Row Office Visit from 10/03/2020 in [facility] Office Visit from 08/01/2020 in [facility] Office Visit from 05/22/2020 in [facility]  Total GAD-7 Score '18 9 19      ' PHQ2-9    [place] Office Visit from 10/27/2020 in [facility] Office Visit from 10/03/2020 in [facility] Office Visit from 08/07/2020 in [facility] Office Visit from 08/01/2020 in [facility] Office Visit from 05/22/2020 in [facility]  PHQ-2 Total Score 0 4 0 2 5  PHQ-9 Total Score -- 18 -- 13 21      Flowsheet Row ED from 11/19/2020 in [facility] ED from 10/19/2020 in [place] Office Visit from 10/03/2020 in [place] CATEGORY High Risk No Risk High Risk        Musculoskeletal  Strength & Muscle Tone: within normal limits Gait & Station: normal Patient leans: N/A  Psychiatric Specialty Exam  Presentation  General Appearance: Appropriate for Environment; Casual  Eye Contact:Fair  Speech:Clear and Coherent; Normal Rate  Speech Volume:Normal  Handedness:Right   Mood and Affect  Mood:Anxious  Affect:Appropriate; Congruent   Thought Process   Thought Processes:Coherent; Goal Directed; Linear  Descriptions of Associations:Intact  Orientation:Full (Time, Place and Person)  Thought Content:WDL; Logical  Diagnosis of Schizophrenia or Schizoaffective disorder in past: No    Hallucinations:Hallucinations: Auditory; Command Description of Command Hallucinations: CAH instructing him to harm himself; although describes as improved Description of Auditory Hallucinations: CAH instructing him to harm himself Description of Visual Hallucinations: black spots  Ideas of Reference:None  Suicidal Thoughts:Suicidal Thoughts: Yes, Active SI Active Intent and/or Plan: With Intent; Without Plan  Homicidal Thoughts:Homicidal Thoughts: Yes, Passive HI Passive Intent and/or Plan: Without Intent; Without Plan   Sensorium  Memory:Immediate Good; Remote Fair; Recent Fair  Judgment:Fair  Insight:Shallow   Executive Functions  Concentration:Fair  Attention Span:Fair  [place]  Language:Good   Psychomotor Activity  Psychomotor Activity:Psychomotor Activity: Normal   Assets  Assets:Communication Skills; Desire for Improvement; Resilience   Sleep  Sleep:Sleep: Fair   Nutritional Assessment (For OBS and FBC admissions only) Has the patient had a weight loss or gain of 10 pounds or more in the last 3 months?: No Has the patient had a decrease in food intake/or appetite?: No Does the patient have dental problems?: No Does the patient have eating habits or behaviors that may be indicators of an eating disorder including binging or inducing vomiting?: No Has the patient recently lost weight without trying?: 0 Has the patient been eating poorly because of a decreased appetite?: 0 Malnutrition Screening Tool Score: 0   Physical Exam  Physical Exam Constitutional:      Appearance: Normal appearance. He is normal weight.  HENT:     Head: Normocephalic and atraumatic.  Eyes:     Extraocular  Movements: Extraocular movements intact.  Pulmonary:     Effort: Pulmonary effort is normal.  Neurological:     General: No focal deficit present.     Mental Status: He is alert and oriented to person, place, and time.  Psychiatric:        Attention and Perception: Attention and perception normal.        Speech: Speech normal.        Behavior: Behavior normal. Behavior is cooperative.        Thought Content: Thought content normal.   Review of Systems  Constitutional:  Negative for chills and fever.  HENT:  Negative for hearing loss.   Eyes:  Negative for discharge and redness.  Respiratory:  Negative for cough.   Cardiovascular:  Negative for chest pain.  Gastrointestinal:  Negative for abdominal pain.  Musculoskeletal:  Negative for myalgias.  Neurological:  Negative for headaches.  Psychiatric/Behavioral:  Positive for depression, hallucinations, substance abuse and suicidal ideas.   Blood pressure (!) 100/48, pulse 87, temperature 98.2 F (36.8 C), temperature source Tympanic, resp. rate 18, SpO2 100 %. There is no height or weight on file to calculate BMI.  Treatment Plan Summary:50yo male with history of MDD, polysubstance abuse, and malingering who presented to the [facility] with worsening SI over the past 2-3 months. Patient was agreeable to [facility] admission. Labs reviewed - CBC, CMP wnl; ethanol, covid PCR, POC covid negative; EKG wnl; UDS +THC and cocaine.  He expresses interest in rehab after discharge; he continues to report SI, vague HI and AVH and remains appropriate for treatment at [facility]    MDD - continue Remeron 52m qhs - continue Seroquel 1059m- continue trazodone prn qhs   Anxiety - continue Buspar 43m43mID   HIV - continue Symtuza [id number]   Asthma - albuterol 108mc2mhaler as needed  Polysubstance abuse (cannabis, cocaine) -UDS+cocaine and THC -patient expressed interest in substance use treatment-SW to assist with residential  rehab  Dispo:ongoing-likely residential rehab-SW assisting   KatheIval Bible9/10/2020 4:31 PM

## 2020-11-20 NOTE — ED Notes (Signed)
Patient did not attend group, even after several prompts from staff.

## 2020-11-20 NOTE — Medical Student Note (Signed)
Behavioral Health Progress Note  Date and Time: 11/20/2020 10:33 AM Name: John [name] MRN:  [id number]  Subjective:  Patient seen and chart review. Patient continues to endorse active SI. He denies having a concrete plan, but says that "he will use anything in front of him". He also reports HI that is not directed at anyone in particular and does not have plans or intent. He reports having VH of "8 black spots" that he sees floating around. When he focuses in on one dot, it "grows bigger and then explodes". He endorses CAH with 3-4 distinct voices that tell him to "kill the black dots". He reports having these hallucinations for the past 2-3 months.   He reports recent use of alcohol and "crack" cocaine. He endorses using both substances prior to his admission. He says that the alcohol muffles the voices and that the cocaine makes the voices stop until the effects of the substance wear off. He reports cannabis use and says that it makes him "feel happy".  He says that he appreciates being on medication and that when he felt like this in the past, the medications he received worked. He does not remember what medications he received or what he was being treated for. When asked about plans after discharge, he says that he has not thought that about plans yet. He reports not having people outside of the hospital to talk to and no close friends. He is married, but avoids contact with his husband. He says that he enjoys birdwatching at the park, reading, and doing crossword puzzles. He watches birds at the park daily and says that this brings him joy.   Diagnosis:  Final diagnoses:  Human immunodeficiency virus (HIV) disease ([place])  Severe episode of recurrent major depressive disorder, without psychotic features ([place])    Total Time spent with patient:   Past Psychiatric History: see H&P Past Medical History:  Past Medical History:  Diagnosis Date   Alcohol abuse    Alcoholism ([place])    Anxiety     Asthma    Asthma    Bipolar disorder ([place])    Chronic low back pain    Cocaine abuse ([place])    Depression    Gout    Gout    HIV (human immunodeficiency virus infection) ([place])    "dx'd ~ 2 yr ago" (09/29/2012)   HIV (human immunodeficiency virus infection) ([place])    HIV disease ([place])    Homelessness    Hypertension    Marijuana abuse    Mental disorder    Schizophrenia ([place])     Past Surgical History:  Procedure Laterality Date   SKIN GRAFT FULL THICKNESS LEG Left ?   POSTERIOR LEFT LEG  AFTER DOG BITES   Family History:  Family History  Problem Relation Age of Onset   Alcoholism Mother    Depression Mother    Alcoholism Brother    Family Psychiatric  History: Not assessed Social History:  Social History   Substance and Sexual Activity  Alcohol Use Yes   Alcohol/week: 12.0 standard drinks   Types: 12 Cans of beer per week   Comment: Daily. "As much as I can get."     Social History   Substance and Sexual Activity  Drug Use Yes   Types: Marijuana, "Crack" cocaine   Comment: States been drinking 4-40oz beers daily.    Social History   Socioeconomic History   Marital status: Single    Spouse name: Not on file  Number of children: 0   Years of education: Not on file   Highest education level: 9th grade  Occupational History   Occupation: unemployed  Tobacco Use   Smoking status: Every Day    Packs/day: 0.50    Years: 27.00    Pack years: 13.50    Types: Cigarettes   Smokeless tobacco: Never  Vaping Use   Vaping Use: Never used  Substance and Sexual Activity   Alcohol use: Yes    Alcohol/week: 12.0 standard drinks    Types: 12 Cans of beer per week    Comment: Daily. "As much as I can get."   Drug use: Yes    Types: Marijuana, "Crack" cocaine    Comment: States been drinking 4-40oz beers daily.   Sexual activity: Not Currently    Partners: Male    Birth control/protection: Condom    Comment: given condoms 04/10/20  Other Topics Concern   Not on  file  Social History Narrative   ** Merged History Encounter **       ** Merged History Encounter **       Social Determinants of Health   Financial Resource Strain: High Risk   Difficulty of Paying Living Expenses: Very hard  Food Insecurity: Landscape architect Present   Worried About Charity fundraiser in the Last Year: Sometimes true   Arboriculturist in the Last Year: Sometimes true  Transportation Needs: Public librarian (Medical): Yes   Lack of Transportation (Non-Medical): Yes  Physical Activity: Inactive   Days of Exercise per Week: 0 days   Minutes of Exercise per Session: 0 min  Stress: Stress Concern Present   Feeling of Stress : Rather much  Social Connections: Moderately Isolated   Frequency of Communication with Friends and Family: Three times a week   Frequency of Social Gatherings with Friends and Family: Three times a week   Attends Religious Services: Never   Active Member of Clubs or Organizations: Yes   Attends Music therapist: More than 4 times per year   Marital Status: Never married   SDOH:  SDOH Screenings   Alcohol Screen: Medium Risk   Last Alcohol Screening Score (AUDIT): 31  Depression (PHQ2-9): Low Risk    PHQ-2 Score: 0  Financial Resource Strain: High Risk   Difficulty of Paying Living Expenses: Very hard  Food Insecurity: Food Insecurity Present   Worried About Charity fundraiser in the Last Year: Sometimes true   Ran Out of Food in the Last Year: Sometimes true  Housing: Medium Risk   Last Housing Risk Score: 1  Physical Activity: Inactive   Days of Exercise per Week: 0 days   Minutes of Exercise per Session: 0 min  Social Connections: Moderately Isolated   Frequency of Communication with Friends and Family: Three times a week   Frequency of Social Gatherings with Friends and Family: Three times a week   Attends Religious Services: Never   Active Member of Clubs or Organizations: Yes    Attends Music therapist: More than 4 times per year   Marital Status: Never married  Stress: Stress Concern Present   Feeling of Stress : Rather much  Tobacco Use: High Risk   Smoking Tobacco Use: Every Day   Smokeless Tobacco Use: Never  Transportation Needs: Unmet Transportation Needs   Lack of Transportation (Medical): Yes   Lack of Transportation (Non-Medical): Yes   Additional Social History:  Pain Medications: See MAR Prescriptions: See MAR Over the Counter: See MAR History of alcohol / drug use?: Yes Longest period of sobriety (when/how long): 2 days Negative Consequences of Use: Personal relationships, Financial Withdrawal Symptoms: Agitation                    Sleep:  Fair. Patient reports that the trazadone helped him sleep better than he has in a long time.  Appetite:  Good  Current Medications:  Current Facility-Administered Medications  Medication Dose Route Frequency Provider Last Rate Last Admin   acetaminophen (TYLENOL) tablet 650 mg  650 mg Oral Q6H PRN Ival Bible, MD       albuterol (VENTOLIN HFA) 108 (90 Base) MCG/ACT inhaler 2 puff  2 puff Inhalation Q4H PRN Ival Bible, MD   2 puff at 11/19/20 2053   alum & mag hydroxide-simeth (MAALOX/MYLANTA) 200-200-20 MG/5ML suspension 30 mL  30 mL Oral Q4H PRN Ival Bible, MD       Darunavir-Cobicistat-Emtricitabine-Tenofovir Alafenamide (SYMTUZA) 800-150-200-10 MG TABS 1 tablet  1 tablet Oral q morning Ival Bible, MD       hydrOXYzine (ATARAX/VISTARIL) tablet 25 mg  25 mg Oral TID PRN Ival Bible, MD       magnesium hydroxide (MILK OF MAGNESIA) suspension 30 mL  30 mL Oral Daily PRN Ival Bible, MD       mirtazapine (REMERON) tablet 15 mg  15 mg Oral QHS Ival Bible, MD   15 mg at 11/19/20 2101   QUEtiapine (SEROQUEL) tablet 100 mg  100 mg Oral QHS Ival Bible, MD   100 mg at 11/19/20 2100   traZODone (DESYREL) tablet 100  mg  100 mg Oral QHS PRN Ival Bible, MD       Current Outpatient Medications  Medication Sig Dispense Refill   albuterol (VENTOLIN HFA) 108 (90 Base) MCG/ACT inhaler INHALE 2 PUFFS INTO THE LUNGS EVERY 4 HOURS AS NEEDED FOR WHEEZING OR SHORTNESS OF BREATH (Patient taking differently: Inhale 2 puffs into the lungs every 4 (four) hours as needed for shortness of breath.) 18 g 0   mirtazapine (REMERON) 30 MG tablet Take 30 mg by mouth at bedtime as needed (For sleep).     QUEtiapine (SEROQUEL) 100 MG tablet Take 100 mg by mouth at bedtime.     SYMTUZA 800-150-200-10 MG TABS Take 1 tablet by mouth every morning. 30 tablet 4   traZODone (DESYREL) 100 MG tablet Take 100 mg by mouth at bedtime as needed for sleep.      Labs  Lab Results:  Admission on 11/19/2020  Component Date Value Ref Range Status   SARS Coronavirus 2 by RT PCR 11/19/2020 NEGATIVE  NEGATIVE Final   Comment: (NOTE) SARS-CoV-2 target nucleic acids are NOT DETECTED.  The SARS-CoV-2 RNA is generally detectable in upper respiratory specimens during the acute phase of infection. The lowest concentration of SARS-CoV-2 viral copies this assay can detect is 138 copies/mL. A negative result does not preclude SARS-Cov-2 infection and should not be used as the sole basis for treatment or other patient management decisions. A negative result may occur with  improper specimen collection/handling, submission of specimen other than nasopharyngeal swab, presence of viral mutation(s) within the areas targeted by this assay, and inadequate number of viral copies(<138 copies/mL). A negative result must be combined with clinical observations, patient history, and epidemiological information. The expected result is Negative.  Fact Sheet for Patients:  EntrepreneurPulse.com.au  Fact  Sheet for Healthcare Providers:  IncredibleEmployment.be  This test is no                          t yet approved  or cleared by the [place] FDA and  has been authorized for detection and/or diagnosis of SARS-CoV-2 by FDA under an Emergency Use Authorization (EUA). This EUA will remain  in effect (meaning this test can be used) for the duration of the COVID-19 declaration under Section 564(b)(1) of the Act, 21 U.S.C.section 360bbb-3(b)(1), unless the authorization is terminated  or revoked sooner.       Influenza A by PCR 11/19/2020 NEGATIVE  NEGATIVE Final   Influenza B by PCR 11/19/2020 NEGATIVE  NEGATIVE Final   Comment: (NOTE) The Xpert Xpress SARS-CoV-2/FLU/RSV plus assay is intended as an aid in the diagnosis of influenza from Nasopharyngeal swab specimens and should not be used as a sole basis for treatment. Nasal washings and aspirates are unacceptable for Xpert Xpress SARS-CoV-2/FLU/RSV testing.  Fact Sheet for Patients: EntrepreneurPulse.com.au  Fact Sheet for Healthcare Providers: IncredibleEmployment.be  This test is not yet approved or cleared by the [place] FDA and has been authorized for detection and/or diagnosis of SARS-CoV-2 by FDA under an Emergency Use Authorization (EUA). This EUA will remain in effect (meaning this test can be used) for the duration of the COVID-19 declaration under Section 564(b)(1) of the Act, 21 U.S.C. section 360bbb-3(b)(1), unless the authorization is terminated or revoked.  Performed at [facility], [place] [address] [place], [place] [address]    WBC 11/19/2020 5.0  4.0 - 10.5 K/uL Final   RBC 11/19/2020 4.15 (A) 4.22 - 5.81 MIL/uL Final   Hemoglobin 11/19/2020 13.4  13.0 - 17.0 g/dL Final   HCT 11/19/2020 39.9  39.0 - 52.0 % Final   MCV 11/19/2020 96.1  80.0 - 100.0 fL Final   MCH 11/19/2020 32.3  26.0 - 34.0 pg Final   MCHC 11/19/2020 33.6  30.0 - 36.0 g/dL Final   RDW 11/19/2020 12.6  11.5 - 15.5 % Final   Platelets 11/19/2020 360  150 - 400 K/uL Final   nRBC 11/19/2020 0.0  0.0 - 0.2 %  Final   Neutrophils Relative % 11/19/2020 42  % Final   Neutro Abs 11/19/2020 2.1  1.7 - 7.7 K/uL Final   Lymphocytes Relative 11/19/2020 39  % Final   Lymphs Abs 11/19/2020 1.9  0.7 - 4.0 K/uL Final   Monocytes Relative 11/19/2020 13  % Final   Monocytes Absolute 11/19/2020 0.7  0.1 - 1.0 K/uL Final   Eosinophils Relative 11/19/2020 5  % Final   Eosinophils Absolute 11/19/2020 0.2  0.0 - 0.5 K/uL Final   Basophils Relative 11/19/2020 1  % Final   Basophils Absolute 11/19/2020 0.0  0.0 - 0.1 K/uL Final   Immature Granulocytes 11/19/2020 0  % Final   Abs Immature Granulocytes 11/19/2020 0.02  0.00 - 0.07 K/uL Final   Performed at [facility], [place] [address], [place], [place] [address]   Sodium 11/19/2020 137  135 - 145 mmol/L Final   Potassium 11/19/2020 4.1  3.5 - 5.1 mmol/L Final   Chloride 11/19/2020 103  98 - 111 mmol/L Final   CO2 11/19/2020 24  22 - 32 mmol/L Final   Glucose, Bld 11/19/2020 81  70 - 99 mg/dL Final   Glucose reference range applies only to samples taken after fasting for at least 8 hours.  BUN 11/19/2020 9  6 - 20 mg/dL Final   Creatinine, Ser 11/19/2020 0.94  0.61 - 1.24 mg/dL Final   Calcium 11/19/2020 9.3  8.9 - 10.3 mg/dL Final   Total Protein 11/19/2020 7.0  6.5 - 8.1 g/dL Final   Albumin 11/19/2020 4.0  3.5 - 5.0 g/dL Final   AST 11/19/2020 24  15 - 41 U/L Final   ALT 11/19/2020 17  0 - 44 U/L Final   Alkaline Phosphatase 11/19/2020 74  38 - 126 U/L Final   Total Bilirubin 11/19/2020 0.6  0.3 - 1.2 mg/dL Final   GFR, Estimated 11/19/2020 >60  >60 mL/min Final   Comment: (NOTE) Calculated using the CKD-EPI Creatinine Equation (2021)    Anion gap 11/19/2020 10  5 - 15 Final   Performed at [place] [facility] [place] [address], [place], [place] [address]   Alcohol, Ethyl (B) 11/19/2020 <10  <10 mg/dL Final   Comment: (NOTE) Lowest detectable limit for serum alcohol is 10 mg/dL.  For medical purposes only. Performed at [facility], [place] [address] [place], [place] [address]    SARSCOV2ONAVIRUS 2 AG 11/19/2020 NEGATIVE  NEGATIVE Final   Comment: (NOTE) SARS-CoV-2 antigen NOT DETECTED.   Negative results are presumptive.  Negative results do not preclude SARS-CoV-2 infection and should not be used as the sole basis for treatment or other patient management decisions, including infection  control decisions, particularly in the presence of clinical signs and  symptoms consistent with COVID-19, or in those who have been in contact with the virus.  Negative results must be combined with clinical observations, patient history, and epidemiological information. The expected result is Negative.  Fact Sheet for Patients: HandmadeRecipes.com.cy  Fact Sheet for Healthcare Providers: FuneralLife.at  This test is not yet approved or cleared by the [place] FDA and  has been authorized for detection and/or diagnosis of SARS-CoV-2 by FDA under an Emergency Use Authorization (EUA).  This EUA will remain in effect (meaning this test can be used) for the duration of  the COV                          ID-19 declaration under Section 564(b)(1) of the Act, 21 U.S.C. section 360bbb-3(b)(1), unless the authorization is terminated or revoked sooner.    Office Visit on 10/27/2020  Component Date Value Ref Range Status   Glucose, Bld 10/27/2020 87  65 - 99 mg/dL Final   Comment: .            Fasting reference interval .    BUN 10/27/2020 15  7 - 25 mg/dL Final   Creat 10/27/2020 0.98  0.60 - 1.29 mg/dL Final   eGFR 10/27/2020 95  > OR = 60 mL/min/1.54m Final   Comment: The eGFR is based on the CKD-EPI 2021 equation. To calculate  the new eGFR from a previous Creatinine or Cystatin C result, go to https://www.kidney.org/professionals/ kdoqi/gfr%5Fcalculator    BUN/Creatinine Ratio 032/54/9826NOT APPLICABLE  6 - 22 (calc) Final   Sodium 10/27/2020 142  135 - 146 mmol/L Final    Potassium 10/27/2020 4.3  3.5 - 5.3 mmol/L Final   Chloride 10/27/2020 104  98 - 110 mmol/L Final   CO2 10/27/2020 29  20 - 32 mmol/L Final   Calcium 10/27/2020 9.6  8.6 - 10.3 mg/dL Final   Total Protein 10/27/2020 7.3  6.1 - 8.1 g/dL Final   Albumin 10/27/2020 4.3  3.6 -  5.1 g/dL Final   Globulin 10/27/2020 3.0  1.9 - 3.7 g/dL (calc) Final   AG Ratio 10/27/2020 1.4  1.0 - 2.5 (calc) Final   Total Bilirubin 10/27/2020 0.6  0.2 - 1.2 mg/dL Final   Alkaline phosphatase (APISO) 10/27/2020 76  36 - 130 U/L Final   AST 10/27/2020 15  10 - 40 U/L Final   ALT 10/27/2020 11  9 - 46 U/L Final   HIV 1 RNA Quant 10/27/2020 Not Detected  Copies/mL Final   HIV-1 RNA Quant, Log 10/27/2020 Not Detected  Log cps/mL Final   Comment: . Reference Range:                           Not Detected     copies/mL                           Not Detected Log copies/mL . Marland Kitchen The test was performed using Real-Time Polymerase Chain Reaction. . . Reportable Range: 20 copies/mL to 10,000,000 copies/mL (1.30 Log copies/mL to 7.00 Log copies/mL). .    CD4 T Cell Abs 10/27/2020 462  400 - 1,790 /uL Final   CD4 % Helper T Cell 10/27/2020 44  33 - 65 % Final   Performed at [facility], [place] [address] [place], [place] [address]   RPR Ser Ql 10/27/2020 NON-REACTIVE  NON-REACTIVE Final  Admission on 09/17/2020, Discharged on 09/23/2020  Component Date Value Ref Range Status   Opiates 09/18/2020 NONE DETECTED  NONE DETECTED Final   Cocaine 09/18/2020 POSITIVE (A) NONE DETECTED Final   Benzodiazepines 09/18/2020 NONE DETECTED  NONE DETECTED Final   Amphetamines 09/18/2020 NONE DETECTED  NONE DETECTED Final   Tetrahydrocannabinol 09/18/2020 POSITIVE (A) NONE DETECTED Final   Barbiturates 09/18/2020 NONE DETECTED  NONE DETECTED Final   Comment: (NOTE) DRUG SCREEN FOR MEDICAL PURPOSES ONLY.  IF CONFIRMATION IS NEEDED FOR ANY PURPOSE, NOTIFY LAB WITHIN 5 DAYS.  LOWEST DETECTABLE LIMITS FOR URINE  DRUG SCREEN Drug Class                     Cutoff (ng/mL) Amphetamine and metabolites    1000 Barbiturate and metabolites    200 Benzodiazepine                 379 Tricyclics and metabolites     300 Opiates and metabolites        300 Cocaine and metabolites        300 THC                            50 Performed at [facility], [place] [address] [place], [place] [address]    Cholesterol 09/18/2020 194  0 - 200 mg/dL Final   Triglycerides 09/18/2020 86  <150 mg/dL Final   HDL 09/18/2020 83  >40 mg/dL Final   Total CHOL/HDL Ratio 09/18/2020 2.3  RATIO Final   VLDL 09/18/2020 17  0 - 40 mg/dL Final   LDL Cholesterol 09/18/2020 94  0 - 99 mg/dL Final   Comment:        Total Cholesterol/HDL:CHD Risk Coronary Heart Disease Risk Table                     Men   Women  1/2 Average Risk   3.4   3.3  Average  Risk       5.0   4.4  2 X Average Risk   9.6   7.1  3 X Average Risk  23.4   11.0        Use the calculated Patient Ratio above and the CHD Risk Table to determine the patient's CHD Risk.        ATP III CLASSIFICATION (LDL):  <100     mg/dL   Optimal  100-129  mg/dL   Near or Above                    Optimal  130-159  mg/dL   Borderline  160-189  mg/dL   High  >190     mg/dL   Very High Performed at [place] [address] [place], [place] [address]    TSH 09/18/2020 5.149 (A) 0.350 - 4.500 uIU/mL Final   Comment: Performed by a 3rd Generation assay with a functional sensitivity of <=0.01 uIU/mL. Performed at [facility], [place] [address], [place], [place] [address]    Hgb A1c MFr Bld 09/18/2020 5.2  4.8 - 5.6 % Final   Comment: (NOTE) Pre diabetes:          5.7%-6.4%  Diabetes:              >6.4%  Glycemic control for   <7.0% adults with diabetes    Mean Plasma Glucose 09/18/2020 102.54  mg/dL Final   Performed at [facility], [place] [address], [place], [place] [address]   Free T4 09/18/2020 0.69  0.61 -  1.12 ng/dL Final   Comment: (NOTE) Biotin ingestion may interfere with free T4 tests. If the results are inconsistent with the TSH level, previous test results, or the clinical presentation, then consider biotin interference. If needed, order repeat testing after stopping biotin. Performed at [facility], [place] [address], [place], [place] [address]    T3, Free 09/21/2020 2.2  2.0 - 4.4 pg/mL Final   Comment: (NOTE) Performed At: [facility] [place], [place] [id number] Rush Farmer MD HU:[id number]   Admission on 09/16/2020, Discharged on 09/17/2020  Component Date Value Ref Range Status   SARS Coronavirus 2 by RT PCR 09/16/2020 NEGATIVE  NEGATIVE Final   Comment: (NOTE) SARS-CoV-2 target nucleic acids are NOT DETECTED.  The SARS-CoV-2 RNA is generally detectable in upper respiratory specimens during the acute phase of infection. The lowest concentration of SARS-CoV-2 viral copies this assay can detect is 138 copies/mL. A negative result does not preclude SARS-Cov-2 infection and should not be used as the sole basis for treatment or other patient management decisions. A negative result may occur with  improper specimen collection/handling, submission of specimen other than nasopharyngeal swab, presence of viral mutation(s) within the areas targeted by this assay, and inadequate number of viral copies(<138 copies/mL). A negative result must be combined with clinical observations, patient history, and epidemiological information. The expected result is Negative.  Fact Sheet for Patients:  EntrepreneurPulse.com.au  Fact Sheet for Healthcare Providers:  IncredibleEmployment.be  This test is no                          t yet approved or cleared by the [place] FDA and  has been authorized for detection and/or diagnosis of SARS-CoV-2 by FDA under an Emergency Use Authorization (EUA). This EUA will remain  in  effect (meaning this test can  be used) for the duration of the COVID-19 declaration under Section 564(b)(1) of the Act, 21 U.S.C.section 360bbb-3(b)(1), unless the authorization is terminated  or revoked sooner.       Influenza A by PCR 09/16/2020 NEGATIVE  NEGATIVE Final   Influenza B by PCR 09/16/2020 NEGATIVE  NEGATIVE Final   Comment: (NOTE) The Xpert Xpress SARS-CoV-2/FLU/RSV plus assay is intended as an aid in the diagnosis of influenza from Nasopharyngeal swab specimens and should not be used as a sole basis for treatment. Nasal washings and aspirates are unacceptable for Xpert Xpress SARS-CoV-2/FLU/RSV testing.  Fact Sheet for Patients: EntrepreneurPulse.com.au  Fact Sheet for Healthcare Providers: IncredibleEmployment.be  This test is not yet approved or cleared by the [place] FDA and has been authorized for detection and/or diagnosis of SARS-CoV-2 by FDA under an Emergency Use Authorization (EUA). This EUA will remain in effect (meaning this test can be used) for the duration of the COVID-19 declaration under Section 564(b)(1) of the Act, 21 U.S.C. section 360bbb-3(b)(1), unless the authorization is terminated or revoked.  Performed at [facility], [place] [address], [place], [place] [address]    Sodium 09/16/2020 139  135 - 145 mmol/L Final   Potassium 09/16/2020 4.1  3.5 - 5.1 mmol/L Final   Chloride 09/16/2020 104  98 - 111 mmol/L Final   CO2 09/16/2020 24  22 - 32 mmol/L Final   Glucose, Bld 09/16/2020 60 (A) 70 - 99 mg/dL Final   Glucose reference range applies only to samples taken after fasting for at least 8 hours.   BUN 09/16/2020 13  6 - 20 mg/dL Final   Creatinine, Ser 09/16/2020 1.18  0.61 - 1.24 mg/dL Final   Calcium 09/16/2020 9.4  8.9 - 10.3 mg/dL Final   Total Protein 09/16/2020 7.1  6.5 - 8.1 g/dL Final   Albumin 09/16/2020 4.0  3.5 - 5.0 g/dL Final   AST 09/16/2020 23  15 - 41 U/L Final   ALT  09/16/2020 17  0 - 44 U/L Final   Alkaline Phosphatase 09/16/2020 91  38 - 126 U/L Final   Total Bilirubin 09/16/2020 0.4  0.3 - 1.2 mg/dL Final   GFR, Estimated 09/16/2020 >60  >60 mL/min Final   Comment: (NOTE) Calculated using the CKD-EPI Creatinine Equation (2021)    Anion gap 09/16/2020 11  5 - 15 Final   Performed at [place] [place] [address], [place], [place] [address]   Alcohol, Ethyl (B) 09/16/2020 <10  <10 mg/dL Final   Comment: (NOTE) Lowest detectable limit for serum alcohol is 10 mg/dL.  For medical purposes only. Performed at [facility], [place] [address] [place], [place] [address]    WBC 09/16/2020 4.0  4.0 - 10.5 K/uL Final   RBC 09/16/2020 4.75  4.22 - 5.81 MIL/uL Final   Hemoglobin 09/16/2020 15.6  13.0 - 17.0 g/dL Final   HCT 09/16/2020 47.7  39.0 - 52.0 % Final   MCV 09/16/2020 100.4 (A) 80.0 - 100.0 fL Final   MCH 09/16/2020 32.8  26.0 - 34.0 pg Final   MCHC 09/16/2020 32.7  30.0 - 36.0 g/dL Final   RDW 09/16/2020 12.6  11.5 - 15.5 % Final   Platelets 09/16/2020 405 (A) 150 - 400 K/uL Final   nRBC 09/16/2020 0.0  0.0 - 0.2 % Final   Neutrophils Relative % 09/16/2020 18  % Final   Neutro Abs 09/16/2020 0.7 (A) 1.7 - 7.7 K/uL Final   Lymphocytes Relative 09/16/2020  59  % Final   Lymphs Abs 09/16/2020 2.4  0.7 - 4.0 K/uL Final   Monocytes Relative 09/16/2020 8  % Final   Monocytes Absolute 09/16/2020 0.3  0.1 - 1.0 K/uL Final   Eosinophils Relative 09/16/2020 12  % Final   Eosinophils Absolute 09/16/2020 0.5  0.0 - 0.5 K/uL Final   Basophils Relative 09/16/2020 3  % Final   Basophils Absolute 09/16/2020 0.1  0.0 - 0.1 K/uL Final   WBC Morphology 09/16/2020 See Note   Final   Smudge Cells   nRBC 09/16/2020 0  0 /100 WBC Final   Abs Immature Granulocytes 09/16/2020 0.00  0.00 - 0.07 K/uL Final   Performed at [facility], [place] [address], [place], [place] [address]   Path Review 09/16/2020 NEUTROPENIA   Final   Comment: Reviewed by Chrystie Nose. Saralyn Pilar, M.D. 09/17/2020 Performed at [facility], [place] [address] [place], [place] [address]    Glucose-Capillary 09/16/2020 107 (A) 70 - 99 mg/dL Final   Glucose reference range applies only to samples taken after fasting for at least 8 hours.  Admission on 08/20/2020, Discharged on 08/21/2020  Component Date Value Ref Range Status   SARS Coronavirus 2 by RT PCR 08/20/2020 POSITIVE (A) NEGATIVE Final   Comment: RESULT CALLED TO, READ BACK BY AND VERIFIED WITH: RN JASMINE J. [id number] FCP (NOTE) SARS-CoV-2 target nucleic acids are DETECTED.  The SARS-CoV-2 RNA is generally detectable in upper respiratory specimens during the acute phase of infection. Positive results are indicative of the presence of the identified virus, but do not rule out bacterial infection or co-infection with other pathogens not detected by the test. Clinical correlation with patient history and other diagnostic information is necessary to determine patient infection status. The expected result is Negative.  Fact Sheet for Patients: EntrepreneurPulse.com.au  Fact Sheet for Healthcare Providers: IncredibleEmployment.be  This test is not yet approved or cleared by the [place] FDA and  has been authorized for detection and/or diagnosis of SARS-CoV-2 by FDA under an Emergency Use Authorization (EUA).  This EUA will remain in effect (meaning this test can be use                          d) for the duration of  the COVID-19 declaration under Section 564(b)(1) of the Act, 21 U.S.C. section 360bbb-3(b)(1), unless the authorization is terminated or revoked sooner.     Influenza A by PCR 08/20/2020 NEGATIVE  NEGATIVE Final   Influenza B by PCR 08/20/2020 NEGATIVE  NEGATIVE Final   Comment: (NOTE) The Xpert Xpress SARS-CoV-2/FLU/RSV plus assay is intended as an aid in the diagnosis of influenza from Nasopharyngeal swab specimens and should not be used as a  sole basis for treatment. Nasal washings and aspirates are unacceptable for Xpert Xpress SARS-CoV-2/FLU/RSV testing.  Fact Sheet for Patients: EntrepreneurPulse.com.au  Fact Sheet for Healthcare Providers: IncredibleEmployment.be  This test is not yet approved or cleared by the [place] FDA and has been authorized for detection and/or diagnosis of SARS-CoV-2 by FDA under an Emergency Use Authorization (EUA). This EUA will remain in effect (meaning this test can be used) for the duration of the COVID-19 declaration under Section 564(b)(1) of the Act, 21 U.S.C. section 360bbb-3(b)(1), unless the authorization is terminated or revoked.  Performed at [facility], [place] [address] [place], [place] [address]   Office Visit on 08/07/2020  Component Date Value Ref Range  Status   Sed Rate 08/07/2020 6  0 - 15 mm/h Final   CRP 08/07/2020 4.3  <8.0 mg/L Final   HIV 1 RNA Quant 08/07/2020 Not Detected  Copies/mL Final   HIV-1 RNA Quant, Log 08/07/2020 Not Detected  Log cps/mL Final   Comment: . Reference Range:                           Not Detected     copies/mL                           Not Detected Log copies/mL . Marland Kitchen The test was performed using Real-Time Polymerase Chain Reaction. . . Reportable Range: 20 copies/mL to 10,000,000 copies/mL (1.30 Log copies/mL to 7.00 Log copies/mL). .   Office Visit on 07/10/2020  Component Date Value Ref Range Status   Glucose, Bld 07/10/2020 66  65 - 99 mg/dL Final   Comment: .            Fasting reference interval .    BUN 07/10/2020 14  7 - 25 mg/dL Final   Creat 07/10/2020 0.83  0.60 - 1.35 mg/dL Final   GFR, Est Non African American 07/10/2020 103  > OR = 60 mL/min/1.21m Final   GFR, Est African American 07/10/2020 120  > OR = 60 mL/min/1.792mFinal   BUN/Creatinine Ratio 0498/26/4158OT APPLICABLE  6 - 22 (calc) Final   Sodium 07/10/2020 139  135 - 146 mmol/L Final   Potassium 07/10/2020  4.2  3.5 - 5.3 mmol/L Final   Chloride 07/10/2020 102  98 - 110 mmol/L Final   CO2 07/10/2020 24  20 - 32 mmol/L Final   Calcium 07/10/2020 9.5  8.6 - 10.3 mg/dL Final   Total Protein 07/10/2020 7.4  6.1 - 8.1 g/dL Final   Albumin 07/10/2020 4.8  3.6 - 5.1 g/dL Final   Globulin 07/10/2020 2.6  1.9 - 3.7 g/dL (calc) Final   AG Ratio 07/10/2020 1.8  1.0 - 2.5 (calc) Final   Total Bilirubin 07/10/2020 0.3  0.2 - 1.2 mg/dL Final   Alkaline phosphatase (APISO) 07/10/2020 76  36 - 130 U/L Final   AST 07/10/2020 29  10 - 40 U/L Final   ALT 07/10/2020 17  9 - 46 U/L Final   HIV 1 RNA Quant 07/10/2020 Not Detected  Copies/mL Final   HIV-1 RNA Quant, Log 07/10/2020 Not Detected  Log cps/mL Final   Comment: . Reference Range:                           Not Detected     copies/mL                           Not Detected Log copies/mL . . Marland Kitchenhe test was performed using Real-Time Polymerase Chain Reaction. . . Reportable Range: 20 copies/mL to 10,000,000 copies/mL (1.30 Log copies/mL to 7.00 Log copies/mL). .    CD4 T Cell Abs 07/10/2020 1,031  400 - 1,790 /uL Final   CD4 % Helper T Cell 07/10/2020 46  33 - 65 % Final   [facility] [place] [id number]  Blood Alcohol level:  Lab Results  Component Value Date   [facility] 11/[facility]   ETH <10 0776/80/8811  Metabolic Disorder  Labs: Lab Results  Component Value Date   HGBA1C 5.2 09/18/2020   MPG 102.54 09/18/2020   MPG 96.8 03/24/2019   Lab Results  Component Value Date   PROLACTIN 6.7 09/29/2012   Lab Results  Component Value Date   CHOL 194 09/18/2020   TRIG 86 09/18/2020   HDL 83 09/18/2020   CHOLHDL 2.3 09/18/2020   VLDL 17 09/18/2020   LDLCALC 94 09/18/2020   LDLCALC 60 04/10/2020    Therapeutic Lab Levels: No results found for: LITHIUM Lab Results  Component Value Date   VALPROATE 59 04/06/2015   VALPROATE 95 11/18/2014   No components found for:   CBMZ  Physical Findings   AIMS    Flowsheet Row Admission (Discharged) from 09/17/2020 in [place] [facility] Admission (Discharged) from 03/23/2019 in [place] [facility] Admission (Discharged) from 03/31/2015 in [place] [facility] Admission (Discharged) from 11/14/2014 in [place] Total Score 0 0 0 0      AUDIT    Flowsheet Row Admission (Discharged) from 09/17/2020 in [place] [facility] Counselor from 05/22/2020 in [facility] Admission (Discharged) from 03/23/2019 in [place] [facility] Admission (Discharged) from 03/31/2015 in [place] [facility] Admission (Discharged) from 11/14/2014 in [place]  Alcohol Use Disorder Identification Test Final Score (AUDIT) 31 32 _0 GAD-7    Flowsheet Row Office Visit from 10/03/2020 in [facility] Office Visit from 08/01/2020 in [facility] Office Visit from 05/22/2020 in [facility]  Total GAD-7 Score _1 PHQ2-9    [place] Office Visit from 10/27/2020 in [facility]ious [facility] Office Visit from 10/03/2020 in [facility] Office Visit from 08/07/2020 in [facility] for Infectious Disease Office Visit from 08/01/2020 in [facility] Office Visit from 05/22/2020 in [facility]  PHQ-2 Total Score 0 4 0 2 5  PHQ-9 Total Score -- 18 -- 13 [place] ED from 11/19/2020 in [facility] ED from 10/19/2020 in [place] Office Visit from 10/03/2020 in [place] CATEGORY High Risk No  Risk High Risk        Musculoskeletal  Strength & Muscle Tone: within normal limits Gait & Station: normal Patient leans: N/A  Psychiatric Specialty Exam  Presentation  General Appearance: Appropriate for Environment; Casual, unkempt hair  Eye Contact:Fair  Speech: Normal Rate, slightly slurring words  Speech Volume:Normal  Handedness:Right   Mood and Affect  Mood: "things have been good here"  Affect:Appropriate; Congruent, full range   Thought Process  Thought Processes:Goal Directed; Coherent; Linear  Descriptions of Associations:Intact  Orientation:Full (Time, Place and Person)  Thought Content:Logical; WDL  Diagnosis of Schizophrenia or Schizoaffective disorder in past: No    Hallucinations:Hallucinations: Auditory; Visual Description of Auditory Hallucinations: CAH instructing him to harm himself, CAH instructing him to "kill the black dots" Description of Visual Hallucinations: black dots that grow when he focuses on it  Ideas of Reference:None  Suicidal Thoughts:Suicidal Thoughts: Yes, Active SI Active Intent and/or Plan: With Intent; Without Plan  Homicidal Thoughts:Homicidal Thoughts: Yes, Passive (vague HI directed at no  one in particular) HI Passive Intent and/or Plan: Without Intent; Without Plan   Sensorium  Memory:Immediate Good; Recent Fair; Remote Fair  Judgment:Fair  Insight:Shallow   Executive Functions  Concentration:Fair  Attention Span:Fair  [place]  Language:Good   Psychomotor Activity  Psychomotor Activity:Psychomotor Activity: Normal   Assets  Assets:Communication Skills; Desire for Improvement; Resilience   Sleep  Sleep:Sleep: Fair   Nutritional Assessment (For OBS and FBC admissions only) Has the patient had a weight loss or gain of 10 pounds or more in the last 3 months?: No Has the patient had a decrease in food intake/or appetite?: No Does the patient have dental problems?:  No Does the patient have eating habits or behaviors that may be indicators of an eating disorder including binging or inducing vomiting?: No Has the patient recently lost weight without trying?: 0 Has the patient been eating poorly because of a decreased appetite?: 0 Malnutrition Screening Tool Score: 0    Physical Exam  Physical Exam Vitals reviewed.  Constitutional:      General: He is not in acute distress.    Appearance: He is not ill-appearing or toxic-appearing.  HENT:     Head: Normocephalic and atraumatic.  Pulmonary:     Effort: Pulmonary effort is normal.  Neurological:     Mental Status: He is alert.   Review of Systems  All other systems reviewed and are negative. Blood pressure (!) 100/48, pulse 87, temperature 98.2 F (36.8 C), temperature source Tympanic, resp. rate 18, SpO2 100 %. There is no height or weight on file to calculate BMI.  Treatment Plan Summary: 50yo male with history of MDD, polysubstance abuse, and malingering who presented to the [facility] with worsening SI over the past 2-3 months. Patient was agreeable to [facility] admission. Labs reviewed - CBC, CMP wnl; ethanol, covid PCR, POC covid negative; EKG wnl; UDS not collected. He is appreciative of medication and is appropriate to continue treatment at [facility].   MDD - continue Remeron 69m qhs - continue Seroquel 1084m- continue trazodone prn qhs   Anxiety - continue Buspar 60m44mID   HIV - continue Symtuza [id number]   Asthma - albuterol 108mc79mhaler as needed  EllioNelva Nayical Student 11/20/2020 10:34 AM

## 2020-11-21 DIAGNOSIS — B20 Human immunodeficiency virus [HIV] disease: Secondary | ICD-10-CM | POA: Diagnosis not present

## 2020-11-21 DIAGNOSIS — F419 Anxiety disorder, unspecified: Secondary | ICD-10-CM | POA: Diagnosis not present

## 2020-11-21 DIAGNOSIS — Z20822 Contact with and (suspected) exposure to covid-19: Secondary | ICD-10-CM | POA: Diagnosis not present

## 2020-11-21 DIAGNOSIS — F332 Major depressive disorder, recurrent severe without psychotic features: Secondary | ICD-10-CM | POA: Diagnosis not present

## 2020-11-21 MED ORDER — MIRTAZAPINE 15 MG PO TABS
15.0000 mg | ORAL_TABLET | Freq: Every day | ORAL | 0 refills | Status: DC
Start: 2020-11-21 — End: 2020-11-24

## 2020-11-21 MED ORDER — SYMTUZA 800-150-200-10 MG PO TABS
1.0000 | ORAL_TABLET | Freq: Every morning | ORAL | 0 refills | Status: DC
Start: 2020-11-21 — End: 2021-02-10

## 2020-11-21 MED ORDER — ALBUTEROL SULFATE HFA 108 (90 BASE) MCG/ACT IN AERS: 2.0000 | INHALATION_SPRAY | RESPIRATORY_TRACT | 0 refills | Status: DC | PRN

## 2020-11-21 MED ORDER — QUETIAPINE FUMARATE 100 MG PO TABS: 100.0000 mg | ORAL_TABLET | Freq: Every day | ORAL | 0 refills | Status: DC

## 2020-11-21 MED ORDER — TRAZODONE HCL 100 MG PO TABS: 100.0000 mg | ORAL_TABLET | Freq: Every evening | ORAL | 0 refills | Status: DC | PRN
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
## 2020-11-21 NOTE — ED Notes (Signed)
Pt asleep in bed. Respirations even and unlabored. Will continue to monitor for safety. ?

## 2020-11-21 NOTE — ED Notes (Signed)
Pt given breakfast.

## 2020-11-21 NOTE — Progress Notes (Signed)
During shift patient has been cooperative towards staff and peers, attended groups and participated. Plan is for patient to be [facility].

## 2020-11-21 NOTE — ED Notes (Signed)
Pt sleeping at present, A&O x 4, no distress noted.   Monitoring for safety.

## 2020-11-21 NOTE — ED Notes (Signed)
Pt given lunch

## 2020-11-21 NOTE — Progress Notes (Signed)
Patient sleeping with even respirations, no objective signs of discomfort.

## 2020-11-21 NOTE — Clinical Social Work Psych Note (Signed)
CSW Update  John [name] has been accepted for a screening appointment to [facility] for substance abuse treatment.   His screening for admission appointment is Monday, 11/24/2020 at 9:00am.  John [name] will need at least a two week (14 day) supply of medication samples, along with a 30 day prescription with one refill.   CSW will continue to follow   Radonna Ricker, MSW, LCSW Clinical Social Worker ([place]) [facility]

## 2020-11-21 NOTE — ED Notes (Signed)
Pt given dinner  

## 2020-11-21 NOTE — ED Provider Notes (Signed)
Behavioral Health Progress Note  Date and Time: 11/21/2020 4:44 PM Name: John [name] MRN:  [id number]  Subjective:     Patient seen and chart reviewed. He has been compliant with medications. On my interview this AM, patient denied SI/HI. He reports continued CAH instructing him to "kill the black dotes" and VH of the black dots. He does not appear acutely psychotic and is able to hold a coherent, linear appropriate conversation in regards to his medications and discharge planning. He reports sleeping well and expresses that he looks forward to going to rehab on Monday. He reports some leg pain but denies any other physical complaints. Discussed with patient that PRN medications are ordered for pain but that he will need to notify RN; patient verbalized understanding. I spoke with pharmacy who reported that there is limited number of symtuza available for samples and that there is not enough to supply full 14 days as requested by rehab. I notified patient of this, and he reported that he just obtained a refill and has a full bottle in his belongings and indicated that this is not an issue. Encouraged patient to attend groups.      Diagnosis:  Final diagnoses:  Human immunodeficiency virus (HIV) disease ([place])  Severe episode of recurrent major depressive disorder, without psychotic features ([place])  Asymptomatic HIV infection, with no history of HIV-related illness (HCC)  PTSD (post-traumatic stress disorder)  Bipolar disorder, curr episode mixed, severe, with psychotic features ([place])  Uncomplicated asthma, unspecified asthma severity, unspecified whether persistent    Total Time spent with patient: 15 minutes  Past Psychiatric History: see H&P Past Medical History:  Past Medical History:  Diagnosis Date   Alcohol abuse    Alcoholism ([place])    Anxiety    Asthma    Asthma    Bipolar disorder ([place])    Chronic low back pain    Cocaine abuse ([place])    Depression    Gout    Gout     HIV (human immunodeficiency virus infection) ([place])    "dx'd ~ 2 yr ago" (09/29/2012)   HIV (human immunodeficiency virus infection) ([place])    HIV disease ([place])    Homelessness    Hypertension    Marijuana abuse    Mental disorder    Schizophrenia ([place])     Past Surgical History:  Procedure Laterality Date   SKIN GRAFT FULL THICKNESS LEG Left ?   POSTERIOR LEFT LEG  AFTER DOG BITES   Family History:  Family History  Problem Relation Age of Onset   Alcoholism Mother    Depression Mother    Alcoholism Brother    Family Psychiatric  History: see H&P Social History:  Social History   Substance and Sexual Activity  Alcohol Use Yes   Alcohol/week: 12.0 standard drinks   Types: 12 Cans of beer per week   Comment: Daily. "As much as I can get."     Social History   Substance and Sexual Activity  Drug Use Yes   Types: Marijuana, "Crack" cocaine   Comment: States been drinking 4-40oz beers daily.    Social History   Socioeconomic History   Marital status: Single    Spouse name: Not on file   Number of children: 0   Years of education: Not on file   Highest education level: 9th grade  Occupational History   Occupation: unemployed  Tobacco Use   Smoking status: Every Day    Packs/day: 0.50    Years: 27.00  Pack years: 13.50    Types: Cigarettes   Smokeless tobacco: Never  Vaping Use   Vaping Use: Never used  Substance and Sexual Activity   Alcohol use: Yes    Alcohol/week: 12.0 standard drinks    Types: 12 Cans of beer per week    Comment: Daily. "As much as I can get."   Drug use: Yes    Types: Marijuana, "Crack" cocaine    Comment: States been drinking 4-40oz beers daily.   Sexual activity: Not Currently    Partners: Male    Birth control/protection: Condom    Comment: given condoms 04/10/20  Other Topics Concern   Not on file  Social History Narrative   ** Merged History Encounter **       ** Merged History Encounter **       Social Determinants of  Health   Financial Resource Strain: High Risk   Difficulty of Paying Living Expenses: Very hard  Food Insecurity: Landscape architect Present   Worried About Charity fundraiser in the Last Year: Sometimes true   Arboriculturist in the Last Year: Sometimes true  Transportation Needs: Public librarian (Medical): Yes   Lack of Transportation (Non-Medical): Yes  Physical Activity: Inactive   Days of Exercise per Week: 0 days   Minutes of Exercise per Session: 0 min  Stress: Stress Concern Present   Feeling of Stress : Rather much  Social Connections: Moderately Isolated   Frequency of Communication with Friends and Family: Three times a week   Frequency of Social Gatherings with Friends and Family: Three times a week   Attends Religious Services: Never   Active Member of Clubs or Organizations: Yes   Attends Music therapist: More than 4 times per year   Marital Status: Never married   SDOH:  SDOH Screenings   Alcohol Screen: Medium Risk   Last Alcohol Screening Score (AUDIT): 31  Depression (PHQ2-9): Low Risk    PHQ-2 Score: 0  Financial Resource Strain: High Risk   Difficulty of Paying Living Expenses: Very hard  Food Insecurity: Food Insecurity Present   Worried About Charity fundraiser in the Last Year: Sometimes true   Ran Out of Food in the Last Year: Sometimes true  Housing: Medium Risk   Last Housing Risk Score: 1  Physical Activity: Inactive   Days of Exercise per Week: 0 days   Minutes of Exercise per Session: 0 min  Social Connections: Moderately Isolated   Frequency of Communication with Friends and Family: Three times a week   Frequency of Social Gatherings with Friends and Family: Three times a week   Attends Religious Services: Never   Active Member of Clubs or Organizations: Yes   Attends Music therapist: More than 4 times per year   Marital Status: Never married  Stress: Stress Concern Present    Feeling of Stress : Rather much  Tobacco Use: High Risk   Smoking Tobacco Use: Every Day   Smokeless Tobacco Use: Never  Transportation Needs: Unmet Transportation Needs   Lack of Transportation (Medical): Yes   Lack of Transportation (Non-Medical): Yes   Additional Social History:    Pain Medications: See MAR Prescriptions: See MAR Over the Counter: See MAR History of alcohol / drug use?: Yes Longest period of sobriety (when/how long): 2 days Negative Consequences of Use: Personal relationships, Financial Withdrawal Symptoms: Agitation  Sleep: Fair  Appetite:  Fair  Current Medications:  Current Facility-Administered Medications  Medication Dose Route Frequency Provider Last Rate Last Admin   acetaminophen (TYLENOL) tablet 650 mg  650 mg Oral Q6H PRN Ival Bible, MD       albuterol (VENTOLIN HFA) 108 (90 Base) MCG/ACT inhaler 2 puff  2 puff Inhalation Q4H PRN Ival Bible, MD   2 puff at 11/19/20 2053   alum & mag hydroxide-simeth (MAALOX/MYLANTA) 200-200-20 MG/5ML suspension 30 mL  30 mL Oral Q4H PRN Ival Bible, MD       Darunavir-Cobicistat-Emtricitabine-Tenofovir Alafenamide (SYMTUZA) 800-150-200-10 MG TABS 1 tablet  1 tablet Oral q morning Ival Bible, MD   1 tablet at 11/21/20 7628   hydrOXYzine (ATARAX/VISTARIL) tablet 25 mg  25 mg Oral TID PRN Ival Bible, MD       magnesium hydroxide (MILK OF MAGNESIA) suspension 30 mL  30 mL Oral Daily PRN Ival Bible, MD       mirtazapine (REMERON) tablet 15 mg  15 mg Oral QHS Ival Bible, MD   15 mg at 11/20/20 2103   QUEtiapine (SEROQUEL) tablet 100 mg  100 mg Oral QHS Ival Bible, MD   100 mg at 11/20/20 2103   traZODone (DESYREL) tablet 100 mg  100 mg Oral QHS PRN Ival Bible, MD       Current Outpatient Medications  Medication Sig Dispense Refill   albuterol (VENTOLIN HFA) 108 (90 Base) MCG/ACT inhaler Inhale 2 puffs into  the lungs every 4 (four) hours as needed for wheezing or shortness of breath. 18 g 0   mirtazapine (REMERON) 15 MG tablet Take 1 tablet (15 mg total) by mouth at bedtime. 30 tablet 0   QUEtiapine (SEROQUEL) 100 MG tablet Take 1 tablet (100 mg total) by mouth at bedtime. 30 tablet 0   SYMTUZA 800-150-200-10 MG TABS Take 1 tablet by mouth every morning. 30 tablet 0   traZODone (DESYREL) 100 MG tablet Take 1 tablet (100 mg total) by mouth at bedtime as needed for sleep. 30 tablet 0    Labs  Lab Results:  Admission on 11/19/2020  Component Date Value Ref Range Status   SARS Coronavirus 2 by RT PCR 11/19/2020 NEGATIVE  NEGATIVE Final   Comment: (NOTE) SARS-CoV-2 target nucleic acids are NOT DETECTED.  The SARS-CoV-2 RNA is generally detectable in upper respiratory specimens during the acute phase of infection. The lowest concentration of SARS-CoV-2 viral copies this assay can detect is 138 copies/mL. A negative result does not preclude SARS-Cov-2 infection and should not be used as the sole basis for treatment or other patient management decisions. A negative result may occur with  improper specimen collection/handling, submission of specimen other than nasopharyngeal swab, presence of viral mutation(s) within the areas targeted by this assay, and inadequate number of viral copies(<138 copies/mL). A negative result must be combined with clinical observations, patient history, and epidemiological information. The expected result is Negative.  Fact Sheet for Patients:  EntrepreneurPulse.com.au  Fact Sheet for Healthcare Providers:  IncredibleEmployment.be  This test is no                          t yet approved or cleared by the [place] FDA and  has been authorized for detection and/or diagnosis of SARS-CoV-2 by FDA under an Emergency Use Authorization (EUA). This EUA will remain  in effect (meaning this test can be used) for the  duration of  the COVID-19 declaration under Section 564(b)(1) of the Act, 21 U.S.C.section 360bbb-3(b)(1), unless the authorization is terminated  or revoked sooner.       Influenza A by PCR 11/19/2020 NEGATIVE  NEGATIVE Final   Influenza B by PCR 11/19/2020 NEGATIVE  NEGATIVE Final   Comment: (NOTE) The Xpert Xpress SARS-CoV-2/FLU/RSV plus assay is intended as an aid in the diagnosis of influenza from Nasopharyngeal swab specimens and should not be used as a sole basis for treatment. Nasal washings and aspirates are unacceptable for Xpert Xpress SARS-CoV-2/FLU/RSV testing.  Fact Sheet for Patients: EntrepreneurPulse.com.au  Fact Sheet for Healthcare Providers: IncredibleEmployment.be  This test is not yet approved or cleared by the [place] FDA and has been authorized for detection and/or diagnosis of SARS-CoV-2 by FDA under an Emergency Use Authorization (EUA). This EUA will remain in effect (meaning this test can be used) for the duration of the COVID-19 declaration under Section 564(b)(1) of the Act, 21 U.S.C. section 360bbb-3(b)(1), unless the authorization is terminated or revoked.  Performed at [facility], [place] [address], [place], [place] [address]    WBC 11/19/2020 5.0  4.0 - 10.5 K/uL Final   RBC 11/19/2020 4.15 (A) 4.22 - 5.81 MIL/uL Final   Hemoglobin 11/19/2020 13.4  13.0 - 17.0 g/dL Final   HCT 11/19/2020 39.9  39.0 - 52.0 % Final   MCV 11/19/2020 96.1  80.0 - 100.0 fL Final   MCH 11/19/2020 32.3  26.0 - 34.0 pg Final   MCHC 11/19/2020 33.6  30.0 - 36.0 g/dL Final   RDW 11/19/2020 12.6  11.5 - 15.5 % Final   Platelets 11/19/2020 360  150 - 400 K/uL Final   nRBC 11/19/2020 0.0  0.0 - 0.2 % Final   Neutrophils Relative % 11/19/2020 42  % Final   Neutro Abs 11/19/2020 2.1  1.7 - 7.7 K/uL Final   Lymphocytes Relative 11/19/2020 39  % Final   Lymphs Abs 11/19/2020 1.9  0.7 - 4.0 K/uL Final   Monocytes Relative 11/19/2020 13  %  Final   Monocytes Absolute 11/19/2020 0.7  0.1 - 1.0 K/uL Final   Eosinophils Relative 11/19/2020 5  % Final   Eosinophils Absolute 11/19/2020 0.2  0.0 - 0.5 K/uL Final   Basophils Relative 11/19/2020 1  % Final   Basophils Absolute 11/19/2020 0.0  0.0 - 0.1 K/uL Final   Immature Granulocytes 11/19/2020 0  % Final   Abs Immature Granulocytes 11/19/2020 0.02  0.00 - 0.07 K/uL Final   Performed at [facility], [place] [address], [place], [place] [address]   Sodium 11/19/2020 137  135 - 145 mmol/L Final   Potassium 11/19/2020 4.1  3.5 - 5.1 mmol/L Final   Chloride 11/19/2020 103  98 - 111 mmol/L Final   CO2 11/19/2020 24  22 - 32 mmol/L Final   Glucose, Bld 11/19/2020 81  70 - 99 mg/dL Final   Glucose reference range applies only to samples taken after fasting for at least 8 hours.   BUN 11/19/2020 9  6 - 20 mg/dL Final   Creatinine, Ser 11/19/2020 0.94  0.61 - 1.24 mg/dL Final   Calcium 11/19/2020 9.3  8.9 - 10.3 mg/dL Final   Total Protein 11/19/2020 7.0  6.5 - 8.1 g/dL Final   Albumin 11/19/2020 4.0  3.5 - 5.0 g/dL Final   AST 11/19/2020 24  15 - 41 U/L Final   ALT 11/19/2020 17  0 - 44 U/L Final  Alkaline Phosphatase 11/19/2020 74  38 - 126 U/L Final   Total Bilirubin 11/19/2020 0.6  0.3 - 1.2 mg/dL Final   GFR, Estimated 11/19/2020 >60  >60 mL/min Final   Comment: (NOTE) Calculated using the CKD-EPI Creatinine Equation (2021)    Anion gap 11/19/2020 10  5 - 15 Final   Performed at [facility] [address], [place], [place] [address]   Alcohol, Ethyl (B) 11/19/2020 <10  <10 mg/dL Final   Comment: (NOTE) Lowest detectable limit for serum alcohol is 10 mg/dL.  For medical purposes only. Performed at [facility], [place] [address], [place], [place] [address]    POC Amphetamine UR 11/20/2020 None Detected  NONE DETECTED (Cut Off Level 1000 ng/mL) Final   POC Secobarbital (BAR) 11/20/2020 None Detected  NONE DETECTED (Cut Off Level 300 ng/mL) Final   POC  Buprenorphine (BUP) 11/20/2020 None Detected  NONE DETECTED (Cut Off Level 10 ng/mL) Final   POC Oxazepam (BZO) 11/20/2020 None Detected  NONE DETECTED (Cut Off Level 300 ng/mL) Final   POC Cocaine UR 11/20/2020 Positive (A) NONE DETECTED (Cut Off Level 300 ng/mL) Final   POC Methamphetamine UR 11/20/2020 None Detected  NONE DETECTED (Cut Off Level 1000 ng/mL) Final   POC Morphine 11/20/2020 None Detected  NONE DETECTED (Cut Off Level 300 ng/mL) Final   POC Oxycodone UR 11/20/2020 None Detected  NONE DETECTED (Cut Off Level 100 ng/mL) Final   POC Methadone UR 11/20/2020 None Detected  NONE DETECTED (Cut Off Level 300 ng/mL) Final   POC Marijuana UR 11/20/2020 Positive (A) NONE DETECTED (Cut Off Level 50 ng/mL) Final   SARS Coronavirus 2 Ag 11/19/2020 Negative  Negative Final   SARSCOV2ONAVIRUS 2 AG 11/19/2020 NEGATIVE  NEGATIVE Final   Comment: (NOTE) SARS-CoV-2 antigen NOT DETECTED.   Negative results are presumptive.  Negative results do not preclude SARS-CoV-2 infection and should not be used as the sole basis for treatment or other patient management decisions, including infection  control decisions, particularly in the presence of clinical signs and  symptoms consistent with COVID-19, or in those who have been in contact with the virus.  Negative results must be combined with clinical observations, patient history, and epidemiological information. The expected result is Negative.  Fact Sheet for Patients: HandmadeRecipes.com.cy  Fact Sheet for Healthcare Providers: FuneralLife.at  This test is not yet approved or cleared by the [place] FDA and  has been authorized for detection and/or diagnosis of SARS-CoV-2 by FDA under an Emergency Use Authorization (EUA).  This EUA will remain in effect (meaning this test can be used) for the duration of  the COV                          ID-19 declaration under Section 564(b)(1) of the Act,  21 U.S.C. section 360bbb-3(b)(1), unless the authorization is terminated or revoked sooner.    Office Visit on 10/27/2020  Component Date Value Ref Range Status   Glucose, Bld 10/27/2020 87  65 - 99 mg/dL Final   Comment: .            Fasting reference interval .    BUN 10/27/2020 15  7 - 25 mg/dL Final   Creat 10/27/2020 0.98  0.60 - 1.29 mg/dL Final   eGFR 10/27/2020 95  > OR = 60 mL/min/1.54m Final   Comment: The eGFR is based on the CKD-EPI 2021 equation. To calculate  the new eGFR from a  previous Creatinine or Cystatin C result, go to https://www.kidney.org/professionals/ kdoqi/gfr%5Fcalculator    BUN/Creatinine Ratio 97/67/3419 NOT APPLICABLE  6 - 22 (calc) Final   Sodium 10/27/2020 142  135 - 146 mmol/L Final   Potassium 10/27/2020 4.3  3.5 - 5.3 mmol/L Final   Chloride 10/27/2020 104  98 - 110 mmol/L Final   CO2 10/27/2020 29  20 - 32 mmol/L Final   Calcium 10/27/2020 9.6  8.6 - 10.3 mg/dL Final   Total Protein 10/27/2020 7.3  6.1 - 8.1 g/dL Final   Albumin 10/27/2020 4.3  3.6 - 5.1 g/dL Final   Globulin 10/27/2020 3.0  1.9 - 3.7 g/dL (calc) Final   AG Ratio 10/27/2020 1.4  1.0 - 2.5 (calc) Final   Total Bilirubin 10/27/2020 0.6  0.2 - 1.2 mg/dL Final   Alkaline phosphatase (APISO) 10/27/2020 76  36 - 130 U/L Final   AST 10/27/2020 15  10 - 40 U/L Final   ALT 10/27/2020 11  9 - 46 U/L Final   HIV 1 RNA Quant 10/27/2020 Not Detected  Copies/mL Final   HIV-1 RNA Quant, Log 10/27/2020 Not Detected  Log cps/mL Final   Comment: . Reference Range:                           Not Detected     copies/mL                           Not Detected Log copies/mL . Marland Kitchen The test was performed using Real-Time Polymerase Chain Reaction. . . Reportable Range: 20 copies/mL to 10,000,000 copies/mL (1.30 Log copies/mL to 7.00 Log copies/mL). .    CD4 T Cell Abs 10/27/2020 462  400 - 1,790 /uL Final   CD4 % Helper T Cell 10/27/2020 44  33 - 65 % Final   Performed at [facility], [place] [address], [place], [place] [address]   RPR Ser Ql 10/27/2020 NON-REACTIVE  NON-REACTIVE Final  Admission on 09/17/2020, Discharged on 09/23/2020  Component Date Value Ref Range Status   Opiates 09/18/2020 NONE DETECTED  NONE DETECTED Final   Cocaine 09/18/2020 POSITIVE (A) NONE DETECTED Final   Benzodiazepines 09/18/2020 NONE DETECTED  NONE DETECTED Final   Amphetamines 09/18/2020 NONE DETECTED  NONE DETECTED Final   Tetrahydrocannabinol 09/18/2020 POSITIVE (A) NONE DETECTED Final   Barbiturates 09/18/2020 NONE DETECTED  NONE DETECTED Final   Comment: (NOTE) DRUG SCREEN FOR MEDICAL PURPOSES ONLY.  IF CONFIRMATION IS NEEDED FOR ANY PURPOSE, NOTIFY LAB WITHIN 5 DAYS.  LOWEST DETECTABLE LIMITS FOR URINE DRUG SCREEN Drug Class                     Cutoff (ng/mL) Amphetamine and metabolites    1000 Barbiturate and metabolites    200 Benzodiazepine                 409 Tricyclics and metabolites     300 Opiates and metabolites        300 Cocaine and metabolites        300 THC                            50 Performed at [facility], [place] [address] [place], [place] [address]    Cholesterol 09/18/2020 194  0 - 200 mg/dL Final   Triglycerides 09/18/2020 86  <  150 mg/dL Final   HDL 09/18/2020 83  >40 mg/dL Final   Total CHOL/HDL Ratio 09/18/2020 2.3  RATIO Final   VLDL 09/18/2020 17  0 - 40 mg/dL Final   LDL Cholesterol 09/18/2020 94  0 - 99 mg/dL Final   Comment:        Total Cholesterol/HDL:CHD Risk Coronary Heart Disease Risk Table                     Men   Women  1/2 Average Risk   3.4   3.3  Average Risk       5.0   4.4  2 X Average Risk   9.6   7.1  3 X Average Risk  23.4   11.0        Use the calculated Patient Ratio above and the CHD Risk Table to determine the patient's CHD Risk.        ATP III CLASSIFICATION (LDL):  <100     mg/dL   Optimal  100-129  mg/dL   Near or Above                    Optimal  130-159  mg/dL    Borderline  160-189  mg/dL   High  >190     mg/dL   Very High Performed at [facility] [address] [place], [place] [address]    TSH 09/18/2020 5.149 (A) 0.350 - 4.500 uIU/mL Final   Comment: Performed by a 3rd Generation assay with a functional sensitivity of <=0.01 uIU/mL. Performed at [facility], [place] [address] [place], [place] [address]    Hgb A1c MFr Bld 09/18/2020 5.2  4.8 - 5.6 % Final   Comment: (NOTE) Pre diabetes:          5.7%-6.4%  Diabetes:              >6.4%  Glycemic control for   <7.0% adults with diabetes    Mean Plasma Glucose 09/18/2020 102.54  mg/dL Final   Performed at [facility], [place] [address], [place], [place] [address]   Free T4 09/18/2020 0.69  0.61 - 1.12 ng/dL Final   Comment: (NOTE) Biotin ingestion may interfere with free T4 tests. If the results are inconsistent with the TSH level, previous test results, or the clinical presentation, then consider biotin interference. If needed, order repeat testing after stopping biotin. Performed at [facility], [place] [address], [place], [place] [address]    T3, Free 09/21/2020 2.2  2.0 - 4.4 pg/mL Final   Comment: (NOTE) Performed At: [facility] [place], [place] [id number] Rush Farmer MD [id number]   Admission on 09/16/2020, Discharged on 09/17/2020  Component Date Value Ref Range Status   SARS Coronavirus 2 by RT PCR 09/16/2020 NEGATIVE  NEGATIVE Final   Comment: (NOTE) SARS-CoV-2 target nucleic acids are NOT DETECTED.  The SARS-CoV-2 RNA is generally detectable in upper respiratory specimens during the acute phase of infection. The lowest concentration of SARS-CoV-2 viral copies this assay can detect is 138 copies/mL. A negative result does not preclude SARS-Cov-2 infection and should not be used as the sole basis for treatment or other patient management decisions. A negative result may occur with  improper  specimen collection/handling, submission of specimen other than nasopharyngeal swab, presence of viral mutation(s) within the areas targeted by this assay, and inadequate number of viral copies(<138 copies/mL). A negative result must be  combined with clinical observations, patient history, and epidemiological information. The expected result is Negative.  Fact Sheet for Patients:  EntrepreneurPulse.com.au  Fact Sheet for Healthcare Providers:  IncredibleEmployment.be  This test is no                          t yet approved or cleared by the [place] FDA and  has been authorized for detection and/or diagnosis of SARS-CoV-2 by FDA under an Emergency Use Authorization (EUA). This EUA will remain  in effect (meaning this test can be used) for the duration of the COVID-19 declaration under Section 564(b)(1) of the Act, 21 U.S.C.section 360bbb-3(b)(1), unless the authorization is terminated  or revoked sooner.       Influenza A by PCR 09/16/2020 NEGATIVE  NEGATIVE Final   Influenza B by PCR 09/16/2020 NEGATIVE  NEGATIVE Final   Comment: (NOTE) The Xpert Xpress SARS-CoV-2/FLU/RSV plus assay is intended as an aid in the diagnosis of influenza from Nasopharyngeal swab specimens and should not be used as a sole basis for treatment. Nasal washings and aspirates are unacceptable for Xpert Xpress SARS-CoV-2/FLU/RSV testing.  Fact Sheet for Patients: EntrepreneurPulse.com.au  Fact Sheet for Healthcare Providers: IncredibleEmployment.be  This test is not yet approved or cleared by the [place] FDA and has been authorized for detection and/or diagnosis of SARS-CoV-2 by FDA under an Emergency Use Authorization (EUA). This EUA will remain in effect (meaning this test can be used) for the duration of the COVID-19 declaration under Section 564(b)(1) of the Act, 21 U.S.C. section 360bbb-3(b)(1), unless the  authorization is terminated or revoked.  Performed at [facility], [place] [address], [place], [place] [address]    Sodium 09/16/2020 139  135 - 145 mmol/L Final   Potassium 09/16/2020 4.1  3.5 - 5.1 mmol/L Final   Chloride 09/16/2020 104  98 - 111 mmol/L Final   CO2 09/16/2020 24  22 - 32 mmol/L Final   Glucose, Bld 09/16/2020 60 (A) 70 - 99 mg/dL Final   Glucose reference range applies only to samples taken after fasting for at least 8 hours.   BUN 09/16/2020 13  6 - 20 mg/dL Final   Creatinine, Ser 09/16/2020 1.18  0.61 - 1.24 mg/dL Final   Calcium 09/16/2020 9.4  8.9 - 10.3 mg/dL Final   Total Protein 09/16/2020 7.1  6.5 - 8.1 g/dL Final   Albumin 09/16/2020 4.0  3.5 - 5.0 g/dL Final   AST 09/16/2020 23  15 - 41 U/L Final   ALT 09/16/2020 17  0 - 44 U/L Final   Alkaline Phosphatase 09/16/2020 91  38 - 126 U/L Final   Total Bilirubin 09/16/2020 0.4  0.3 - 1.2 mg/dL Final   GFR, Estimated 09/16/2020 >60  >60 mL/min Final   Comment: (NOTE) Calculated using the CKD-EPI Creatinine Equation (2021)    Anion gap 09/16/2020 11  5 - 15 Final   Performed at [facility] [address], [place], [place] [address]   Alcohol, Ethyl (B) 09/16/2020 <10  <10 mg/dL Final   Comment: (NOTE) Lowest detectable limit for serum alcohol is 10 mg/dL.  For medical purposes only. Performed at [facility], [place] [address], [place], [place] [address]    WBC 09/16/2020 4.0  4.0 - 10.5 K/uL Final   RBC 09/16/2020 4.75  4.22 - 5.81 MIL/uL Final   Hemoglobin 09/16/2020 15.6  13.0 - 17.0 g/dL Final   HCT 09/16/2020 47.7  39.0 - 52.0 % Final   MCV 09/16/2020 100.4 (A) 80.0 - 100.0 fL Final   MCH 09/16/2020 32.8  26.0 - 34.0 pg Final   MCHC 09/16/2020 32.7  30.0 - 36.0 g/dL Final   RDW 09/16/2020 12.6  11.5 - 15.5 % Final   Platelets 09/16/2020 405 (A) 150 - 400 K/uL Final   nRBC 09/16/2020 0.0  0.0 - 0.2 % Final   Neutrophils Relative % 09/16/2020 18  % Final   Neutro Abs 09/16/2020  0.7 (A) 1.7 - 7.7 K/uL Final   Lymphocytes Relative 09/16/2020 59  % Final   Lymphs Abs 09/16/2020 2.4  0.7 - 4.0 K/uL Final   Monocytes Relative 09/16/2020 8  % Final   Monocytes Absolute 09/16/2020 0.3  0.1 - 1.0 K/uL Final   Eosinophils Relative 09/16/2020 12  % Final   Eosinophils Absolute 09/16/2020 0.5  0.0 - 0.5 K/uL Final   Basophils Relative 09/16/2020 3  % Final   Basophils Absolute 09/16/2020 0.1  0.0 - 0.1 K/uL Final   WBC Morphology 09/16/2020 See Note   Final   Smudge Cells   nRBC 09/16/2020 0  0 /100 WBC Final   Abs Immature Granulocytes 09/16/2020 0.00  0.00 - 0.07 K/uL Final   Performed at [facility], [place] [address], [place], [place] [address]   Path Review 09/16/2020 NEUTROPENIA   Final   Comment: Reviewed by Chrystie Nose. Saralyn Pilar, M.D. 09/17/2020 Performed at [facility], [place] [address] [place], [place] [address]    Glucose-Capillary 09/16/2020 107 (A) 70 - 99 mg/dL Final   Glucose reference range applies only to samples taken after fasting for at least 8 hours.  Admission on 08/20/2020, Discharged on 08/21/2020  Component Date Value Ref Range Status   SARS Coronavirus 2 by RT PCR 08/20/2020 POSITIVE (A) NEGATIVE Final   Comment: RESULT CALLED TO, READ BACK BY AND VERIFIED WITH: RN JASMINE J. [id number] FCP (NOTE) SARS-CoV-2 target nucleic acids are DETECTED.  The SARS-CoV-2 RNA is generally detectable in upper respiratory specimens during the acute phase of infection. Positive results are indicative of the presence of the identified virus, but do not rule out bacterial infection or co-infection with other pathogens not detected by the test. Clinical correlation with patient history and other diagnostic information is necessary to determine patient infection status. The expected result is Negative.  Fact Sheet for Patients: EntrepreneurPulse.com.au  Fact Sheet for Healthcare  Providers: IncredibleEmployment.be  This test is not yet approved or cleared by the [place] FDA and  has been authorized for detection and/or diagnosis of SARS-CoV-2 by FDA under an Emergency Use Authorization (EUA).  This EUA will remain in effect (meaning this test can be use                          d) for the duration of  the COVID-19 declaration under Section 564(b)(1) of the Act, 21 U.S.C. section 360bbb-3(b)(1), unless the authorization is terminated or revoked sooner.     Influenza A by PCR 08/20/2020 NEGATIVE  NEGATIVE Final   Influenza B by PCR 08/20/2020 NEGATIVE  NEGATIVE Final   Comment: (NOTE) The Xpert Xpress SARS-CoV-2/FLU/RSV plus assay is intended as an aid in the diagnosis of influenza from Nasopharyngeal swab specimens and should not be used as a sole basis for treatment. Nasal washings and aspirates are unacceptable for Xpert Xpress SARS-CoV-2/FLU/RSV testing.  Fact Sheet for Patients: EntrepreneurPulse.com.au  Fact Sheet  for Healthcare Providers: IncredibleEmployment.be  This test is not yet approved or cleared by the [place] and has been authorized for detection and/or diagnosis of SARS-CoV-2 by FDA under an Emergency Use Authorization (EUA). This EUA will remain in effect (meaning this test can be used) for the duration of the COVID-19 declaration under Section 564(b)(1) of the Act, 21 U.S.C. section 360bbb-3(b)(1), unless the authorization is terminated or revoked.  Performed at [facility], [place] [address], [place],  [address]   Office Visit on 08/07/2020  Component Date Value Ref Range Status   Sed Rate 08/07/2020 6  0 - 15 mm/h Final   CRP 08/07/2020 4.3  <8.0 mg/L Final   HIV 1 RNA Quant 08/07/2020 Not Detected  Copies/mL Final   HIV-1 RNA Quant, Log 08/07/2020 Not Detected  Log cps/mL Final   Comment: . Reference Range:                           Not Detected      copies/mL                           Not Detected Log copies/mL . Marland Kitchen The test was performed using Real-Time Polymerase Chain Reaction. . . Reportable Range: 20 copies/mL to 10,000,000 copies/mL (1.30 Log copies/mL to 7.00 Log copies/mL). .   Office Visit on 07/10/2020  Component Date Value Ref Range Status   Glucose, Bld 07/10/2020 66  65 - 99 mg/dL Final   Comment: .            Fasting reference interval .    BUN 07/10/2020 14  7 - 25 mg/dL Final   Creat 07/10/2020 0.83  0.60 - 1.35 mg/dL Final   GFR, Est Non African American 07/10/2020 103  > OR = 60 mL/min/1.48m Final   GFR, Est African American 07/10/2020 120  > OR = 60 mL/min/1.74mFinal   BUN/Creatinine Ratio 0466/59/9357OT APPLICABLE  6 - 22 (calc) Final   Sodium 07/10/2020 139  135 - 146 mmol/L Final   Potassium 07/10/2020 4.2  3.5 - 5.3 mmol/L Final   Chloride 07/10/2020 102  98 - 110 mmol/L Final   CO2 07/10/2020 24  20 - 32 mmol/L Final   Calcium 07/10/2020 9.5  8.6 - 10.3 mg/dL Final   Total Protein 07/10/2020 7.4  6.1 - 8.1 g/dL Final   Albumin 07/10/2020 4.8  3.6 - 5.1 g/dL Final   Globulin 07/10/2020 2.6  1.9 - 3.7 g/dL (calc) Final   AG Ratio 07/10/2020 1.8  1.0 - 2.5 (calc) Final   Total Bilirubin 07/10/2020 0.3  0.2 - 1.2 mg/dL Final   Alkaline phosphatase (APISO) 07/10/2020 76  36 - 130 U/L Final   AST 07/10/2020 29  10 - 40 U/L Final   ALT 07/10/2020 17  9 - 46 U/L Final   HIV 1 RNA Quant 07/10/2020 Not Detected  Copies/mL Final   HIV-1 RNA Quant, Log 07/10/2020 Not Detected  Log cps/mL Final   Comment: . Reference Range:                           Not Detected     copies/mL                           Not Detected Log copies/mL . . Marland Kitchenhe  test was performed using Real-Time Polymerase Chain Reaction. . . Reportable Range: 20 copies/mL to 10,000,000 copies/mL (1.30 Log copies/mL to 7.00 Log copies/mL). .    CD4 T Cell Abs 07/10/2020 1,031  400 - 1,790 /uL Final   CD4 % Helper T Cell 07/10/2020 46   33 - 65 % Final   Performed at [facility], [place] [address], [place], [place] [address]    Blood Alcohol level:  Lab Results  Component Value Date   ETH <10 11/19/2020   ETH <10 97/58/8325    Metabolic Disorder Labs: Lab Results  Component Value Date   HGBA1C 5.2 09/18/2020   MPG 102.54 09/18/2020   MPG 96.8 03/24/2019   Lab Results  Component Value Date   PROLACTIN 6.7 09/29/2012   Lab Results  Component Value Date   CHOL 194 09/18/2020   TRIG 86 09/18/2020   HDL 83 09/18/2020   CHOLHDL 2.3 09/18/2020   VLDL 17 09/18/2020   LDLCALC 94 09/18/2020   LDLCALC 60 04/10/2020    Therapeutic Lab Levels: No results found for: LITHIUM Lab Results  Component Value Date   VALPROATE 59 04/06/2015   VALPROATE 95 11/18/2014   No components found for:  CBMZ  Physical Findings   AIMS    Flowsheet Row Admission (Discharged) from 09/17/2020 in [place] [facility] Admission (Discharged) from 03/23/2019 in [place] [facility] Admission (Discharged) from 03/31/2015 in [place] [facility] Admission (Discharged) from 11/14/2014 in [place] Total Score 0 0 0 0      AUDIT    Flowsheet Row Admission (Discharged) from 09/17/2020 in [place] [facility] Counselor from 05/22/2020 in [facility] Admission (Discharged) from 03/23/2019 in [place] [facility] Admission (Discharged) from 03/31/2015 in [place] [facility] Admission (Discharged) from 11/14/2014 in [place]  Alcohol Use Disorder Identification Test Final Score (AUDIT) 31 32 '28 12 12      ' GAD-7    Flowsheet Row Office Visit from 10/03/2020 in [facility] Office Visit from 08/01/2020 in [facility] Office Visit from 05/22/2020 in  [facility]  Total GAD-7 Score '18 9 19      ' PHQ2-9    [place] Office Visit from 10/27/2020 in [facility] Office Visit from 10/03/2020 in [facility] Office Visit from 08/07/2020 in [facility] for Infectious Disease Office Visit from 08/01/2020 in [facility] Office Visit from 05/22/2020 in [facility]  PHQ-2 Total Score 0 4 0 2 5  PHQ-9 Total Score -- 18 -- 13 [place] ED from 11/19/2020 in [facility] ED from 10/19/2020 in [place] Office Visit from 10/03/2020 in [place] CATEGORY High Risk No Risk High Risk        Musculoskeletal  Strength & Muscle Tone: within normal limits Gait & Station: normal Patient leans: N/A  Psychiatric Specialty Exam  Presentation  General Appearance: Appropriate for Environment; Casual  Eye Contact:Fair  Speech:Clear and Coherent; Normal Rate  Speech Volume:Normal  Handedness:Right   Mood and Affect  Mood:Dysphoric; Anxious  Affect:Appropriate; Congruent   Thought Process  Thought Processes:Coherent; Goal Directed; Linear  Descriptions  of Associations:Intact  Orientation:Full (Time, Place and Person)  Thought Content:WDL; Logical  Diagnosis of Schizophrenia or Schizoaffective disorder in past: No    Hallucinations:Hallucinations: Auditory; Visual Description of Command Hallucinations: CAH instructing him to harm himself Description of Visual Hallucinations: blkack spots  Ideas of Reference:None  Suicidal Thoughts:Suicidal Thoughts: No  Homicidal Thoughts:Homicidal Thoughts: No   Sensorium  Memory:Immediate Good; Recent Fair; Remote Fair  Judgment:Fair  Insight:Fair   Executive Functions  Concentration:Fair  Attention  Span:Fair  [place]   Psychomotor Activity  Psychomotor Activity:Psychomotor Activity: Normal   Assets  Assets:Communication Skills; Desire for Improvement; Resilience   Sleep  Sleep:Sleep: Fair   No data recorded   Physical Exam  Physical Exam Constitutional:      Appearance: Normal appearance. He is normal weight.  HENT:     Head: Normocephalic and atraumatic.  Eyes:     Extraocular Movements: Extraocular movements intact.  Pulmonary:     Effort: Pulmonary effort is normal.  Neurological:     General: No focal deficit present.     Mental Status: He is alert and oriented to person, place, and time.  Psychiatric:        Attention and Perception: Attention and perception normal.        Speech: Speech normal.        Behavior: Behavior normal. Behavior is cooperative.        Thought Content: Thought content normal.   Review of Systems  Constitutional:  Negative for chills and fever.  HENT:  Negative for hearing loss.   Eyes:  Negative for discharge and redness.  Respiratory:  Negative for cough.   Cardiovascular:  Negative for chest pain.  Gastrointestinal:  Negative for abdominal pain.  Musculoskeletal:  Positive for myalgias.       Leg pain  Neurological:  Negative for headaches.  Psychiatric/Behavioral:  Positive for depression, hallucinations, substance abuse and suicidal ideas.   Blood pressure 106/73, pulse 87, temperature (!) 97.5 F (36.4 C), temperature source Tympanic, resp. rate 18, SpO2 99 %. There is no height or weight on file to calculate BMI.  Treatment Plan Summary:50yo male with history of MDD, polysubstance abuse, and malingering who presented to the [facility] with worsening SI over the past 2-3 months. Patient was agreeable to [facility] admission. Labs reviewed - CBC, CMP wnl; ethanol, covid PCR, POC covid negative; EKG wnl; UDS +THC and cocaine.  He denies SI/HI today although reports ongoing AVH. Despite  reporting AVH, patient does not objectively appear psychotic.  Pt remains appropriate for treatment at [facility].    MDD - continue Remeron 41m qhs - continue Seroquel 1026m- continue trazodone prn qhs   HIV - continue Symtuza [phone]   Asthma - albuterol 108m22mnhaler as needed  Polysubstance abuse (cannabis, cocaine) -UDS+cocaine and THC -patient expressed interest in substance use treatment-SW to assist with residential rehab-[facility]  Dispo:ongoing-likely residential rehab-SW assisting-His screening for admission appointment is Monday, 11/24/2020 at 9:00am. Samples have been ordered from pharmacy and 30 day script has been placed on his physical chart for Monday discharge. If medications are adjusted over the weekend, will need to reorder sample and script.   KathIval Bible 11/21/2020 4:44 PM

## 2020-11-21 NOTE — ED Notes (Signed)
Patient did not attend wrap up due to him not wanting to get out of bed. Staff encouraged patient to attend group still refused.

## 2020-11-21 NOTE — Medical Student Note (Signed)
Behavioral Health Progress Note  Date and Time: 11/21/2020 8:16 AM Name: John [name] MRN:  [id number]  Subjective:  Patient seen and chart reviewed. Patient opted to lay in bed during interview. Patient continues to endorse active SI, but says that those thoughts are "slower" than yesterday. He continues to deny having a concrete plan, as he "can't get his hands on anything to use". He also reports vague passive HI that is not directed at anyone in particular. He endorses VH of "black spots" and CAH with voices telling him to "kill the black dots" that is unchanged from yesterday.   Patient says that groups have been alright and that he feels like he attended what feels like 17 of them yesterday. He does not remember skipping any groups. He says that his sleep has been better since taking medication and that he was able to sleep through the night. He says that he has sciatica and his pain makes it hard for him to move around. He remains interested in residential rehab treatment.   Diagnosis:  Final diagnoses:  Human immunodeficiency virus (HIV) disease ([place])  Severe episode of recurrent major depressive disorder, without psychotic features ([place])    Total Time spent with patient:   Past Psychiatric History: See H&P Past Medical History:  Past Medical History:  Diagnosis Date   Alcohol abuse    Alcoholism ([place])    Anxiety    Asthma    Asthma    Bipolar disorder ([place])    Chronic low back pain    Cocaine abuse ([place])    Depression    Gout    Gout    HIV (human immunodeficiency virus infection) ([place])    "dx'd ~ 2 yr ago" (09/29/2012)   HIV (human immunodeficiency virus infection) ([place])    HIV disease ([place])    Homelessness    Hypertension    Marijuana abuse    Mental disorder    Schizophrenia ([place])     Past Surgical History:  Procedure Laterality Date   SKIN GRAFT FULL THICKNESS LEG Left ?   POSTERIOR LEFT LEG  AFTER DOG BITES   Family History:  Family History  Problem  Relation Age of Onset   Alcoholism Mother    Depression Mother    Alcoholism Brother    Family Psychiatric  History: See H&P Social History:  Social History   Substance and Sexual Activity  Alcohol Use Yes   Alcohol/week: 12.0 standard drinks   Types: 12 Cans of beer per week   Comment: Daily. "As much as I can get."     Social History   Substance and Sexual Activity  Drug Use Yes   Types: Marijuana, "Crack" cocaine   Comment: States been drinking 4-40oz beers daily.    Social History   Socioeconomic History   Marital status: Single    Spouse name: Not on file   Number of children: 0   Years of education: Not on file   Highest education level: 9th grade  Occupational History   Occupation: unemployed  Tobacco Use   Smoking status: Every Day    Packs/day: 0.50    Years: 27.00    Pack years: 13.50    Types: Cigarettes   Smokeless tobacco: Never  Vaping Use   Vaping Use: Never used  Substance and Sexual Activity   Alcohol use: Yes    Alcohol/week: 12.0 standard drinks    Types: 12 Cans of beer per week    Comment: Daily. "As much as  I can get."   Drug use: Yes    Types: Marijuana, "Crack" cocaine    Comment: States been drinking 4-40oz beers daily.   Sexual activity: Not Currently    Partners: Male    Birth control/protection: Condom    Comment: given condoms 04/10/20  Other Topics Concern   Not on file  Social History Narrative   ** Merged History Encounter **       ** Merged History Encounter **       Social Determinants of Health   Financial Resource Strain: High Risk   Difficulty of Paying Living Expenses: Very hard  Food Insecurity: Landscape architect Present   Worried About Charity fundraiser in the Last Year: Sometimes true   Arboriculturist in the Last Year: Sometimes true  Transportation Needs: Public librarian (Medical): Yes   Lack of Transportation (Non-Medical): Yes  Physical Activity: Inactive   Days of  Exercise per Week: 0 days   Minutes of Exercise per Session: 0 min  Stress: Stress Concern Present   Feeling of Stress : Rather much  Social Connections: Moderately Isolated   Frequency of Communication with Friends and Family: Three times a week   Frequency of Social Gatherings with Friends and Family: Three times a week   Attends Religious Services: Never   Active Member of Clubs or Organizations: Yes   Attends Music therapist: More than 4 times per year   Marital Status: Never married   SDOH:  SDOH Screenings   Alcohol Screen: Medium Risk   Last Alcohol Screening Score (AUDIT): 31  Depression (PHQ2-9): Low Risk    PHQ-2 Score: 0  Financial Resource Strain: High Risk   Difficulty of Paying Living Expenses: Very hard  Food Insecurity: Food Insecurity Present   Worried About Charity fundraiser in the Last Year: Sometimes true   Ran Out of Food in the Last Year: Sometimes true  Housing: Medium Risk   Last Housing Risk Score: 1  Physical Activity: Inactive   Days of Exercise per Week: 0 days   Minutes of Exercise per Session: 0 min  Social Connections: Moderately Isolated   Frequency of Communication with Friends and Family: Three times a week   Frequency of Social Gatherings with Friends and Family: Three times a week   Attends Religious Services: Never   Active Member of Clubs or Organizations: Yes   Attends Music therapist: More than 4 times per year   Marital Status: Never married  Stress: Stress Concern Present   Feeling of Stress : Rather much  Tobacco Use: High Risk   Smoking Tobacco Use: Every Day   Smokeless Tobacco Use: Never  Transportation Needs: Unmet Transportation Needs   Lack of Transportation (Medical): Yes   Lack of Transportation (Non-Medical): Yes   Additional Social History:    Pain Medications: See MAR Prescriptions: See MAR Over the Counter: See MAR History of alcohol / drug use?: Yes Longest period of sobriety  (when/how long): 2 days Negative Consequences of Use: Personal relationships, Financial Withdrawal Symptoms: Agitation                    Sleep: Fair  Appetite:  Good  Current Medications:  Current Facility-Administered Medications  Medication Dose Route Frequency Provider Last Rate Last Admin   acetaminophen (TYLENOL) tablet 650 mg  650 mg Oral Q6H PRN Ival Bible, MD       albuterol (  VENTOLIN HFA) 108 (90 Base) MCG/ACT inhaler 2 puff  2 puff Inhalation Q4H PRN Ival Bible, MD   2 puff at 11/19/20 2053   alum & mag hydroxide-simeth (MAALOX/MYLANTA) 200-200-20 MG/5ML suspension 30 mL  30 mL Oral Q4H PRN Ival Bible, MD       Darunavir-Cobicistat-Emtricitabine-Tenofovir Alafenamide (SYMTUZA) 800-150-200-10 MG TABS 1 tablet  1 tablet Oral q morning Ival Bible, MD   1 tablet at 11/20/20 1139   hydrOXYzine (ATARAX/VISTARIL) tablet 25 mg  25 mg Oral TID PRN Ival Bible, MD       magnesium hydroxide (MILK OF MAGNESIA) suspension 30 mL  30 mL Oral Daily PRN Ival Bible, MD       mirtazapine (REMERON) tablet 15 mg  15 mg Oral QHS Ival Bible, MD   15 mg at 11/20/20 2103   QUEtiapine (SEROQUEL) tablet 100 mg  100 mg Oral QHS Ival Bible, MD   100 mg at 11/20/20 2103   traZODone (DESYREL) tablet 100 mg  100 mg Oral QHS PRN Ival Bible, MD       Current Outpatient Medications  Medication Sig Dispense Refill   albuterol (VENTOLIN HFA) 108 (90 Base) MCG/ACT inhaler INHALE 2 PUFFS INTO THE LUNGS EVERY 4 HOURS AS NEEDED FOR WHEEZING OR SHORTNESS OF BREATH (Patient taking differently: Inhale 2 puffs into the lungs every 4 (four) hours as needed for shortness of breath.) 18 g 0   mirtazapine (REMERON) 30 MG tablet Take 30 mg by mouth at bedtime as needed (For sleep).     QUEtiapine (SEROQUEL) 100 MG tablet Take 100 mg by mouth at bedtime.     SYMTUZA 800-150-200-10 MG TABS Take 1 tablet by mouth every morning. 30  tablet 4   traZODone (DESYREL) 100 MG tablet Take 100 mg by mouth at bedtime as needed for sleep.      Labs  Lab Results:  Admission on 11/19/2020  Component Date Value Ref Range Status   SARS Coronavirus 2 by RT PCR 11/19/2020 NEGATIVE  NEGATIVE Final   Comment: (NOTE) SARS-CoV-2 target nucleic acids are NOT DETECTED.  The SARS-CoV-2 RNA is generally detectable in upper respiratory specimens during the acute phase of infection. The lowest concentration of SARS-CoV-2 viral copies this assay can detect is 138 copies/mL. A negative result does not preclude SARS-Cov-2 infection and should not be used as the sole basis for treatment or other patient management decisions. A negative result may occur with  improper specimen collection/handling, submission of specimen other than nasopharyngeal swab, presence of viral mutation(s) within the areas targeted by this assay, and inadequate number of viral copies(<138 copies/mL). A negative result must be combined with clinical observations, patient history, and epidemiological information. The expected result is Negative.  Fact Sheet for Patients:  EntrepreneurPulse.com.au  Fact Sheet for Healthcare Providers:  IncredibleEmployment.be  This test is no                          t yet approved or cleared by the [place] FDA and  has been authorized for detection and/or diagnosis of SARS-CoV-2 by FDA under an Emergency Use Authorization (EUA). This EUA will remain  in effect (meaning this test can be used) for the duration of the COVID-19 declaration under Section 564(b)(1) of the Act, 21 U.S.C.section 360bbb-3(b)(1), unless the authorization is terminated  or revoked sooner.       Influenza A by PCR 11/19/2020 NEGATIVE  NEGATIVE  Final   Influenza B by PCR 11/19/2020 NEGATIVE  NEGATIVE Final   Comment: (NOTE) The Xpert Xpress SARS-CoV-2/FLU/RSV plus assay is intended as an aid in the diagnosis of  influenza from Nasopharyngeal swab specimens and should not be used as a sole basis for treatment. Nasal washings and aspirates are unacceptable for Xpert Xpress SARS-CoV-2/FLU/RSV testing.  Fact Sheet for Patients: EntrepreneurPulse.com.au  Fact Sheet for Healthcare Providers: IncredibleEmployment.be  This test is not yet approved or cleared by the [place] FDA and has been authorized for detection and/or diagnosis of SARS-CoV-2 by FDA under an Emergency Use Authorization (EUA). This EUA will remain in effect (meaning this test can be used) for the duration of the COVID-19 declaration under Section 564(b)(1) of the Act, 21 U.S.C. section 360bbb-3(b)(1), unless the authorization is terminated or revoked.  Performed at [facility], [place] [address] [place], [place] [address]    WBC 11/19/2020 5.0  4.0 - 10.5 K/uL Final   RBC 11/19/2020 4.15 (A) 4.22 - 5.81 MIL/uL Final   Hemoglobin 11/19/2020 13.4  13.0 - 17.0 g/dL Final   HCT 11/19/2020 39.9  39.0 - 52.0 % Final   MCV 11/19/2020 96.1  80.0 - 100.0 fL Final   MCH 11/19/2020 32.3  26.0 - 34.0 pg Final   MCHC 11/19/2020 33.6  30.0 - 36.0 g/dL Final   RDW 11/19/2020 12.6  11.5 - 15.5 % Final   Platelets 11/19/2020 360  150 - 400 K/uL Final   nRBC 11/19/2020 0.0  0.0 - 0.2 % Final   Neutrophils Relative % 11/19/2020 42  % Final   Neutro Abs 11/19/2020 2.1  1.7 - 7.7 K/uL Final   Lymphocytes Relative 11/19/2020 39  % Final   Lymphs Abs 11/19/2020 1.9  0.7 - 4.0 K/uL Final   Monocytes Relative 11/19/2020 13  % Final   Monocytes Absolute 11/19/2020 0.7  0.1 - 1.0 K/uL Final   Eosinophils Relative 11/19/2020 5  % Final   Eosinophils Absolute 11/19/2020 0.2  0.0 - 0.5 K/uL Final   Basophils Relative 11/19/2020 1  % Final   Basophils Absolute 11/19/2020 0.0  0.0 - 0.1 K/uL Final   Immature Granulocytes 11/19/2020 0  % Final   Abs Immature Granulocytes 11/19/2020 0.02  0.00 - 0.07 K/uL  Final   Performed at [facility], [place] [address], [place], [place] [address]   Sodium 11/19/2020 137  135 - 145 mmol/L Final   Potassium 11/19/2020 4.1  3.5 - 5.1 mmol/L Final   Chloride 11/19/2020 103  98 - 111 mmol/L Final   CO2 11/19/2020 24  22 - 32 mmol/L Final   Glucose, Bld 11/19/2020 81  70 - 99 mg/dL Final   Glucose reference range applies only to samples taken after fasting for at least 8 hours.   BUN 11/19/2020 9  6 - 20 mg/dL Final   Creatinine, Ser 11/19/2020 0.94  0.61 - 1.24 mg/dL Final   Calcium 11/19/2020 9.3  8.9 - 10.3 mg/dL Final   Total Protein 11/19/2020 7.0  6.5 - 8.1 g/dL Final   Albumin 11/19/2020 4.0  3.5 - 5.0 g/dL Final   AST 11/19/2020 24  15 - 41 U/L Final   ALT 11/19/2020 17  0 - 44 U/L Final   Alkaline Phosphatase 11/19/2020 74  38 - 126 U/L Final   Total Bilirubin 11/19/2020 0.6  0.3 - 1.2 mg/dL Final   GFR, Estimated 11/19/2020 >60  >60 mL/min Final   Comment: (NOTE) Calculated  using the CKD-EPI Creatinine Equation (2021)    Anion gap 11/19/2020 10  5 - 15 Final   Performed at [facility], [place] [address], [place], [place] [address]   Alcohol, Ethyl (B) 11/19/2020 <10  <10 mg/dL Final   Comment: (NOTE) Lowest detectable limit for serum alcohol is 10 mg/dL.  For medical purposes only. Performed at [facility], [place] [address] [place], [place] [address]    POC Amphetamine UR 11/20/2020 None Detected  NONE DETECTED (Cut Off Level 1000 ng/mL) Final   POC Secobarbital (BAR) 11/20/2020 None Detected  NONE DETECTED (Cut Off Level 300 ng/mL) Final   POC Buprenorphine (BUP) 11/20/2020 None Detected  NONE DETECTED (Cut Off Level 10 ng/mL) Final   POC Oxazepam (BZO) 11/20/2020 None Detected  NONE DETECTED (Cut Off Level 300 ng/mL) Final   POC Cocaine UR 11/20/2020 Positive (A) NONE DETECTED (Cut Off Level 300 ng/mL) Final   POC Methamphetamine UR 11/20/2020 None Detected  NONE DETECTED (Cut Off Level 1000 ng/mL) Final   POC Morphine  11/20/2020 None Detected  NONE DETECTED (Cut Off Level 300 ng/mL) Final   POC Oxycodone UR 11/20/2020 None Detected  NONE DETECTED (Cut Off Level 100 ng/mL) Final   POC Methadone UR 11/20/2020 None Detected  NONE DETECTED (Cut Off Level 300 ng/mL) Final   POC Marijuana UR 11/20/2020 Positive (A) NONE DETECTED (Cut Off Level 50 ng/mL) Final   SARS Coronavirus 2 Ag 11/19/2020 Negative  Negative Final   SARSCOV2ONAVIRUS 2 AG 11/19/2020 NEGATIVE  NEGATIVE Final   Comment: (NOTE) SARS-CoV-2 antigen NOT DETECTED.   Negative results are presumptive.  Negative results do not preclude SARS-CoV-2 infection and should not be used as the sole basis for treatment or other patient management decisions, including infection  control decisions, particularly in the presence of clinical signs and  symptoms consistent with COVID-19, or in those who have been in contact with the virus.  Negative results must be combined with clinical observations, patient history, and epidemiological information. The expected result is Negative.  Fact Sheet for Patients: HandmadeRecipes.com.cy  Fact Sheet for Healthcare Providers: FuneralLife.at  This test is not yet approved or cleared by the [place] FDA and  has been authorized for detection and/or diagnosis of SARS-CoV-2 by FDA under an Emergency Use Authorization (EUA).  This EUA will remain in effect (meaning this test can be used) for the duration of  the COV                          ID-19 declaration under Section 564(b)(1) of the Act, 21 U.S.C. section 360bbb-3(b)(1), unless the authorization is terminated or revoked sooner.    Office Visit on 10/27/2020  Component Date Value Ref Range Status   Glucose, Bld 10/27/2020 87  65 - 99 mg/dL Final   Comment: .            Fasting reference interval .    BUN 10/27/2020 15  7 - 25 mg/dL Final   Creat 10/27/2020 0.98  0.60 - 1.29 mg/dL Final   eGFR 10/27/2020 95   > OR = 60 mL/min/1.50m Final   Comment: The eGFR is based on the CKD-EPI 2021 equation. To calculate  the new eGFR from a previous Creatinine or Cystatin C result, go to https://www.kidney.org/professionals/ kdoqi/gfr%5Fcalculator    BUN/Creatinine Ratio 082/80/0349NOT APPLICABLE  6 - 22 (calc) Final   Sodium 10/27/2020 142  135 - 146 mmol/L Final  Potassium 10/27/2020 4.3  3.5 - 5.3 mmol/L Final   Chloride 10/27/2020 104  98 - 110 mmol/L Final   CO2 10/27/2020 29  20 - 32 mmol/L Final   Calcium 10/27/2020 9.6  8.6 - 10.3 mg/dL Final   Total Protein 10/27/2020 7.3  6.1 - 8.1 g/dL Final   Albumin 10/27/2020 4.3  3.6 - 5.1 g/dL Final   Globulin 10/27/2020 3.0  1.9 - 3.7 g/dL (calc) Final   AG Ratio 10/27/2020 1.4  1.0 - 2.5 (calc) Final   Total Bilirubin 10/27/2020 0.6  0.2 - 1.2 mg/dL Final   Alkaline phosphatase (APISO) 10/27/2020 76  36 - 130 U/L Final   AST 10/27/2020 15  10 - 40 U/L Final   ALT 10/27/2020 11  9 - 46 U/L Final   HIV 1 RNA Quant 10/27/2020 Not Detected  Copies/mL Final   HIV-1 RNA Quant, Log 10/27/2020 Not Detected  Log cps/mL Final   Comment: . Reference Range:                           Not Detected     copies/mL                           Not Detected Log copies/mL . Marland Kitchen The test was performed using Real-Time Polymerase Chain Reaction. . . Reportable Range: 20 copies/mL to 10,000,000 copies/mL (1.30 Log copies/mL to 7.00 Log copies/mL). .    CD4 T Cell Abs 10/27/2020 462  400 - 1,790 /uL Final   CD4 % Helper T Cell 10/27/2020 44  33 - 65 % Final   Performed at [facility], [place] [address] [place], [place] [address]   RPR Ser Ql 10/27/2020 NON-REACTIVE  NON-REACTIVE Final  Admission on 09/17/2020, Discharged on 09/23/2020  Component Date Value Ref Range Status   Opiates 09/18/2020 NONE DETECTED  NONE DETECTED Final   Cocaine 09/18/2020 POSITIVE (A) NONE DETECTED Final   Benzodiazepines 09/18/2020 NONE DETECTED  NONE DETECTED Final    Amphetamines 09/18/2020 NONE DETECTED  NONE DETECTED Final   Tetrahydrocannabinol 09/18/2020 POSITIVE (A) NONE DETECTED Final   Barbiturates 09/18/2020 NONE DETECTED  NONE DETECTED Final   Comment: (NOTE) DRUG SCREEN FOR MEDICAL PURPOSES ONLY.  IF CONFIRMATION IS NEEDED FOR ANY PURPOSE, NOTIFY LAB WITHIN 5 DAYS.  LOWEST DETECTABLE LIMITS FOR URINE DRUG SCREEN Drug Class                     Cutoff (ng/mL) Amphetamine and metabolites    1000 Barbiturate and metabolites    200 Benzodiazepine                 585 Tricyclics and metabolites     300 Opiates and metabolites        300 Cocaine and metabolites        300 THC                            50 Performed at [facility], [place] [address], [place], [place] [address]    Cholesterol 09/18/2020 194  0 - 200 mg/dL Final   Triglycerides 09/18/2020 86  <150 mg/dL Final   HDL 09/18/2020 83  >40 mg/dL Final   Total CHOL/HDL Ratio 09/18/2020 2.3  RATIO Final   VLDL 09/18/2020 17  0 - 40 mg/dL Final   LDL Cholesterol  09/18/2020 94  0 - 99 mg/dL Final   Comment:        Total Cholesterol/HDL:CHD Risk Coronary Heart Disease Risk Table                     Men   Women  1/2 Average Risk   3.4   3.3  Average Risk       5.0   4.4  2 X Average Risk   9.6   7.1  3 X Average Risk  23.4   11.0        Use the calculated Patient Ratio above and the CHD Risk Table to determine the patient's CHD Risk.        ATP III CLASSIFICATION (LDL):  <100     mg/dL   Optimal  100-129  mg/dL   Near or Above                    Optimal  130-159  mg/dL   Borderline  160-189  mg/dL   High  >190     mg/dL   Very High Performed at [place] [address], [place], [place] [address]    TSH 09/18/2020 5.149 (A) 0.350 - 4.500 uIU/mL Final   Comment: Performed by a 3rd Generation assay with a functional sensitivity of <=0.01 uIU/mL. Performed at [facility], [place] [address] [place], [place] [address]     Hgb A1c MFr Bld 09/18/2020 5.2  4.8 - 5.6 % Final   Comment: (NOTE) Pre diabetes:          5.7%-6.4%  Diabetes:              >6.4%  Glycemic control for   <7.0% adults with diabetes    Mean Plasma Glucose 09/18/2020 102.54  mg/dL Final   Performed at [facility], [place] [address], [place], [place] [address]   Free T4 09/18/2020 0.69  0.61 - 1.12 ng/dL Final   Comment: (NOTE) Biotin ingestion may interfere with free T4 tests. If the results are inconsistent with the TSH level, previous test results, or the clinical presentation, then consider biotin interference. If needed, order repeat testing after stopping biotin. Performed at [facility], [place] [address] [place], [place] [address]    T3, Free 09/21/2020 2.2  2.0 - 4.4 pg/mL Final   Comment: (NOTE) Performed At: [facility] [place], [place] [id number] Rush Farmer MD [id number]   Admission on 09/16/2020, Discharged on 09/17/2020  Component Date Value Ref Range Status   SARS Coronavirus 2 by RT PCR 09/16/2020 NEGATIVE  NEGATIVE Final   Comment: (NOTE) SARS-CoV-2 target nucleic acids are NOT DETECTED.  The SARS-CoV-2 RNA is generally detectable in upper respiratory specimens during the acute phase of infection. The lowest concentration of SARS-CoV-2 viral copies this assay can detect is 138 copies/mL. A negative result does not preclude SARS-Cov-2 infection and should not be used as the sole basis for treatment or other patient management decisions. A negative result may occur with  improper specimen collection/handling, submission of specimen other than nasopharyngeal swab, presence of viral mutation(s) within the areas targeted by this assay, and inadequate number of viral copies(<138 copies/mL). A negative result must be combined with clinical observations, patient history, and epidemiological information. The expected result is Negative.  Fact Sheet for Patients:   EntrepreneurPulse.com.au  Fact Sheet for Healthcare Providers:  IncredibleEmployment.be  This test is no  t yet approved or cleared by the [place] and  has been authorized for detection and/or diagnosis of SARS-CoV-2 by FDA under an Emergency Use Authorization (EUA). This EUA will remain  in effect (meaning this test can be used) for the duration of the COVID-19 declaration under Section 564(b)(1) of the Act, 21 U.S.C.section 360bbb-3(b)(1), unless the authorization is terminated  or revoked sooner.       Influenza A by PCR 09/16/2020 NEGATIVE  NEGATIVE Final   Influenza B by PCR 09/16/2020 NEGATIVE  NEGATIVE Final   Comment: (NOTE) The Xpert Xpress SARS-CoV-2/FLU/RSV plus assay is intended as an aid in the diagnosis of influenza from Nasopharyngeal swab specimens and should not be used as a sole basis for treatment. Nasal washings and aspirates are unacceptable for Xpert Xpress SARS-CoV-2/FLU/RSV testing.  Fact Sheet for Patients: EntrepreneurPulse.com.au  Fact Sheet for Healthcare Providers: IncredibleEmployment.be  This test is not yet approved or cleared by the [place] FDA and has been authorized for detection and/or diagnosis of SARS-CoV-2 by FDA under an Emergency Use Authorization (EUA). This EUA will remain in effect (meaning this test can be used) for the duration of the COVID-19 declaration under Section 564(b)(1) of the Act, 21 U.S.C. section 360bbb-3(b)(1), unless the authorization is terminated or revoked.  Performed at [facility], [place] [address], [place], [place] [address]    Sodium 09/16/2020 139  135 - 145 mmol/L Final   Potassium 09/16/2020 4.1  3.5 - 5.1 mmol/L Final   Chloride 09/16/2020 104  98 - 111 mmol/L Final   CO2 09/16/2020 24  22 - 32 mmol/L Final   Glucose, Bld 09/16/2020 60 (A) 70 - 99 mg/dL Final   Glucose reference range  applies only to samples taken after fasting for at least 8 hours.   BUN 09/16/2020 13  6 - 20 mg/dL Final   Creatinine, Ser 09/16/2020 1.18  0.61 - 1.24 mg/dL Final   Calcium 09/16/2020 9.4  8.9 - 10.3 mg/dL Final   Total Protein 09/16/2020 7.1  6.5 - 8.1 g/dL Final   Albumin 09/16/2020 4.0  3.5 - 5.0 g/dL Final   AST 09/16/2020 23  15 - 41 U/L Final   ALT 09/16/2020 17  0 - 44 U/L Final   Alkaline Phosphatase 09/16/2020 91  38 - 126 U/L Final   Total Bilirubin 09/16/2020 0.4  0.3 - 1.2 mg/dL Final   GFR, Estimated 09/16/2020 >60  >60 mL/min Final   Comment: (NOTE) Calculated using the CKD-EPI Creatinine Equation (2021)    Anion gap 09/16/2020 11  5 - 15 Final   Performed at [place] [address] [place], [place] [address]   Alcohol, Ethyl (B) 09/16/2020 <10  <10 mg/dL Final   Comment: (NOTE) Lowest detectable limit for serum alcohol is 10 mg/dL.  For medical purposes only. Performed at [facility], [place] [address], [place], [place] [address]    WBC 09/16/2020 4.0  4.0 - 10.5 K/uL Final   RBC 09/16/2020 4.75  4.22 - 5.81 MIL/uL Final   Hemoglobin 09/16/2020 15.6  13.0 - 17.0 g/dL Final   HCT 09/16/2020 47.7  39.0 - 52.0 % Final   MCV 09/16/2020 100.4 (A) 80.0 - 100.0 fL Final   MCH 09/16/2020 32.8  26.0 - 34.0 pg Final   MCHC 09/16/2020 32.7  30.0 - 36.0 g/dL Final   RDW 09/16/2020 12.6  11.5 - 15.5 % Final   Platelets 09/16/2020 405 (A) 150 - 400  K/uL Final   nRBC 09/16/2020 0.0  0.0 - 0.2 % Final   Neutrophils Relative % 09/16/2020 18  % Final   Neutro Abs 09/16/2020 0.7 (A) 1.7 - 7.7 K/uL Final   Lymphocytes Relative 09/16/2020 59  % Final   Lymphs Abs 09/16/2020 2.4  0.7 - 4.0 K/uL Final   Monocytes Relative 09/16/2020 8  % Final   Monocytes Absolute 09/16/2020 0.3  0.1 - 1.0 K/uL Final   Eosinophils Relative 09/16/2020 12  % Final   Eosinophils Absolute 09/16/2020 0.5  0.0 - 0.5 K/uL Final   Basophils Relative 09/16/2020 3  % Final   Basophils  Absolute 09/16/2020 0.1  0.0 - 0.1 K/uL Final   WBC Morphology 09/16/2020 See Note   Final   Smudge Cells   nRBC 09/16/2020 0  0 /100 WBC Final   Abs Immature Granulocytes 09/16/2020 0.00  0.00 - 0.07 K/uL Final   Performed at [facility], [place] [address] [place], [place] [address]   Path Review 09/16/2020 NEUTROPENIA   Final   Comment: Reviewed by Chrystie Nose. Saralyn Pilar, M.D. 09/17/2020 Performed at [facility], [place] [address], [place], [place] [address]    Glucose-Capillary 09/16/2020 107 (A) 70 - 99 mg/dL Final   Glucose reference range applies only to samples taken after fasting for at least 8 hours.  Admission on 08/20/2020, Discharged on 08/21/2020  Component Date Value Ref Range Status   SARS Coronavirus 2 by RT PCR 08/20/2020 POSITIVE (A) NEGATIVE Final   Comment: RESULT CALLED TO, READ BACK BY AND VERIFIED WITH: RN JASMINE J. [id number] FCP (NOTE) SARS-CoV-2 target nucleic acids are DETECTED.  The SARS-CoV-2 RNA is generally detectable in upper respiratory specimens during the acute phase of infection. Positive results are indicative of the presence of the identified virus, but do not rule out bacterial infection or co-infection with other pathogens not detected by the test. Clinical correlation with patient history and other diagnostic information is necessary to determine patient infection status. The expected result is Negative.  Fact Sheet for Patients: EntrepreneurPulse.com.au  Fact Sheet for Healthcare Providers: IncredibleEmployment.be  This test is not yet approved or cleared by the [place] FDA and  has been authorized for detection and/or diagnosis of SARS-CoV-2 by FDA under an Emergency Use Authorization (EUA).  This EUA will remain in effect (meaning this test can be use                          d) for the duration of  the COVID-19 declaration under Section 564(b)(1) of the Act, 21 U.S.C. section  360bbb-3(b)(1), unless the authorization is terminated or revoked sooner.     Influenza A by PCR 08/20/2020 NEGATIVE  NEGATIVE Final   Influenza B by PCR 08/20/2020 NEGATIVE  NEGATIVE Final   Comment: (NOTE) The Xpert Xpress SARS-CoV-2/FLU/RSV plus assay is intended as an aid in the diagnosis of influenza from Nasopharyngeal swab specimens and should not be used as a sole basis for treatment. Nasal washings and aspirates are unacceptable for Xpert Xpress SARS-CoV-2/FLU/RSV testing.  Fact Sheet for Patients: EntrepreneurPulse.com.au  Fact Sheet for Healthcare Providers: IncredibleEmployment.be  This test is not yet approved or cleared by the [place] FDA and has been authorized for detection and/or diagnosis of SARS-CoV-2 by FDA under an Emergency Use Authorization (EUA). This EUA will remain in effect (meaning this test can be used) for the duration of the COVID-19 declaration under Section 564(b)(1)  of the Act, 21 U.S.C. section 360bbb-3(b)(1), unless the authorization is terminated or revoked.  Performed at [facility], [place] [address], [place], [place] [address]   Office Visit on 08/07/2020  Component Date Value Ref Range Status   Sed Rate 08/07/2020 6  0 - 15 mm/h Final   CRP 08/07/2020 4.3  <8.0 mg/L Final   HIV 1 RNA Quant 08/07/2020 Not Detected  Copies/mL Final   HIV-1 RNA Quant, Log 08/07/2020 Not Detected  Log cps/mL Final   Comment: . Reference Range:                           Not Detected     copies/mL                           Not Detected Log copies/mL . Marland Kitchen The test was performed using Real-Time Polymerase Chain Reaction. . . Reportable Range: 20 copies/mL to 10,000,000 copies/mL (1.30 Log copies/mL to 7.00 Log copies/mL). .   Office Visit on 07/10/2020  Component Date Value Ref Range Status   Glucose, Bld 07/10/2020 66  65 - 99 mg/dL Final   Comment: .            Fasting reference interval .    BUN  07/10/2020 14  7 - 25 mg/dL Final   Creat 07/10/2020 0.83  0.60 - 1.35 mg/dL Final   GFR, Est Non African American 07/10/2020 103  > OR = 60 mL/min/1.9m Final   GFR, Est African American 07/10/2020 120  > OR = 60 mL/min/1.714mFinal   BUN/Creatinine Ratio 0403/55/9741OT APPLICABLE  6 - 22 (calc) Final   Sodium 07/10/2020 139  135 - 146 mmol/L Final   Potassium 07/10/2020 4.2  3.5 - 5.3 mmol/L Final   Chloride 07/10/2020 102  98 - 110 mmol/L Final   CO2 07/10/2020 24  20 - 32 mmol/L Final   Calcium 07/10/2020 9.5  8.6 - 10.3 mg/dL Final   Total Protein 07/10/2020 7.4  6.1 - 8.1 g/dL Final   Albumin 07/10/2020 4.8  3.6 - 5.1 g/dL Final   Globulin 07/10/2020 2.6  1.9 - 3.7 g/dL (calc) Final   AG Ratio 07/10/2020 1.8  1.0 - 2.5 (calc) Final   Total Bilirubin 07/10/2020 0.3  0.2 - 1.2 mg/dL Final   Alkaline phosphatase (APISO) 07/10/2020 76  36 - 130 U/L Final   AST 07/10/2020 29  10 - 40 U/L Final   ALT 07/10/2020 17  9 - 46 U/L Final   HIV 1 RNA Quant 07/10/2020 Not Detected  Copies/mL Final   HIV-1 RNA Quant, Log 07/10/2020 Not Detected  Log cps/mL Final   Comment: . Reference Range:                           Not Detected     copies/mL                           Not Detected Log copies/mL . . Marland Kitchenhe test was performed using Real-Time Polymerase Chain Reaction. . . Reportable Range: 20 copies/mL to 10,000,000 copies/mL (1.30 Log copies/mL to 7.00 Log copies/mL). .    CD4 T Cell Abs 07/10/2020 1,031  400 - 1,790 /uL Final   CD4 % Helper T Cell 07/10/2020 46  33 - 65 % Final   Performed  at [facility], [place] [address], [place], [place] [address]    Blood Alcohol level:  Lab Results  Component Value Date   ETH <10 11/19/2020   ETH <10 42/59/5638    Metabolic Disorder Labs: Lab Results  Component Value Date   HGBA1C 5.2 09/18/2020   MPG 102.54 09/18/2020   MPG 96.8 03/24/2019   Lab Results  Component Value Date   PROLACTIN 6.7 09/29/2012   Lab  Results  Component Value Date   CHOL 194 09/18/2020   TRIG 86 09/18/2020   HDL 83 09/18/2020   CHOLHDL 2.3 09/18/2020   VLDL 17 09/18/2020   LDLCALC 94 09/18/2020   LDLCALC 60 04/10/2020    Therapeutic Lab Levels: No results found for: LITHIUM Lab Results  Component Value Date   VALPROATE 59 04/06/2015   VALPROATE 95 11/18/2014   No components found for:  CBMZ  Physical Findings   AIMS    Flowsheet Row Admission (Discharged) from 09/17/2020 in [place] [facility] Admission (Discharged) from 03/23/2019 in [facility] [facility] Admission (Discharged) from 03/31/2015 in [place] [facility] Admission (Discharged) from 11/14/2014 in [place] Total Score 0 0 0 0      AUDIT    Flowsheet Row Admission (Discharged) from 09/17/2020 in [place] [facility] Counselor from 05/22/2020 in [facility] Admission (Discharged) from 03/23/2019 in [place] [facility] Admission (Discharged) from 03/31/2015 in [place] [facility] Admission (Discharged) from 11/14/2014 in [place]  Alcohol Use Disorder Identification Test Final Score (AUDIT) 31 32 '28 12 12      ' GAD-7    Flowsheet Row Office Visit from 10/03/2020 in [facility] Office Visit from 08/01/2020 in [facility] Office Visit from 05/22/2020 in [facility]  Total GAD-7 Score '18 9 19      ' PHQ2-9    [place] Office Visit from 10/27/2020 in [facility] Office Visit from 10/03/2020 in [facility] Office Visit from 08/07/2020 in [facility] Office Visit from 08/01/2020 in [facility] Office Visit from  05/22/2020 in [facility]  PHQ-2 Total Score 0 4 0 2 5  PHQ-9 Total Score -- 18 -- 13 [place] ED from 11/19/2020 in [facility] ED from 10/19/2020 in [place] Office Visit from 10/03/2020 in [place] CATEGORY High Risk No Risk High Risk        Musculoskeletal  Strength & Muscle Tone: within normal limits Gait & Station:  Not assessed, patient laying in bed Patient leans: N/A  Psychiatric Specialty Exam  Presentation  General Appearance/Behavior: Appropriate for Environment; Casual; Thin, unkempt hair, laying in bed covered by blanket. Throughout the interview, he closed his eyes periodically and dozed off, but responded to questions appropriately after prompting.   Eye Contact:Fair when open, eyes were often closed during interview.  Speech:Clear and Coherent; Normal Rate  Speech Volume:Normal  Handedness:Right   Mood and Affect  Mood: "Alright, better than yesterday"  Affect: Euthymic; Appropriate; Congruent, irritable   Thought Process  Thought Processes:Coherent; Goal Directed; Linear  Descriptions of Associations:Intact  Orientation:Full (Time, Place and Person)  Thought  Content:WDL; Logical  Diagnosis of Schizophrenia or Schizoaffective disorder in past: No    Hallucinations:Hallucinations: Auditory; Command Description of Command Hallucinations: CAH instructing him to harm himself; CAH with 3-4 voices telling him to "kill black spots" Description of Visual Hallucinations: black spots  Ideas of Reference:None  Suicidal Thoughts:Suicidal Thoughts: Yes, Active, no specific plans  Homicidal Thoughts:Homicidal Thoughts: Yes, Passive, directed at no one in particular, no plans   Sensorium  Memory:Immediate Good; Remote Fair; Recent Fair  Judgment:Fair  Insight:Shallow   Executive Functions   Concentration:Fair  Attention Span:Fair  Recall: Poor  Fund of Knowledge:Fair  Language:Good   Psychomotor Activity  Psychomotor Activity:Psychomotor Activity: Normal   Assets  Assets:Communication Skills; Desire for Improvement; Resilience   Sleep  Sleep:Sleep: Fair   No data recorded  Physical Exam  Physical Exam Vitals reviewed.  Constitutional:      General: He is not in acute distress.    Appearance: He is not ill-appearing or toxic-appearing.  HENT:     Head: Normocephalic and atraumatic.  Pulmonary:     Effort: Pulmonary effort is normal.   Review of Systems  All other systems reviewed and are negative. Blood pressure 106/73, pulse 87, temperature (!) 97.5 F (36.4 C), temperature source Tympanic, resp. rate 18, SpO2 99 %. There is no height or weight on file to calculate BMI.  Treatment Plan Summary: 50yo male with history of MDD, polysubstance abuse, and malingering who presented to the [facility] with worsening SI over the past 2-3 months. Patient remains agreeable to [facility] admission. He expresses interest in rehab after discharge; he continues to report SI, vague HI and AVH and remains appropriate for treatment at [facility].    MDD - continue Remeron 18m qhs - continue Seroquel [id number] - continue trazodone [id number] qhs   HIV - continue Symtuza [phone]   Asthma - albuterol [id number] as needed   Polysubstance abuse (cannabis, cocaine) -patient remains interested in substance use treatment -SW following to assist with residential rehab   [place] to residential [place]hab by SW  EllioNelva Nayical Student 11/21/2020 8:16 AM

## 2020-11-21 NOTE — ED Notes (Signed)
Pt sleeping at present, no distress noted.  Monitoring for safety. 

## 2020-11-21 NOTE — Clinical Social Work Psych Note (Signed)
CSW Update   CSW met with patient to determine the patient's progress and to determine if there were any additional needs or concerns identified.   John [name] reports he feels "wonderful" this morning. He inquired about potential beds for residential treatment. CSW informed John [name] that his UDS had resulted and that information would be referred to the following facilities for review:  [facility]  CSW explained that if there were no beds at the facilities listed above, CSW would refer him to [facility] as a last result.   John [name] expressed understanding and gratitude.   John [name] reports he does not have any additional questions or concerns at this time.    CSW will continue to follow.    Radonna Ricker, MSW, LCSW Clinical Education officer, museum ([place]

## 2020-11-21 NOTE — Progress Notes (Signed)
Patient participated in [place] given lunch and now resting in room having down time before next group starts.

## 2020-11-22 DIAGNOSIS — F419 Anxiety disorder, unspecified: Secondary | ICD-10-CM | POA: Diagnosis not present

## 2020-11-22 DIAGNOSIS — B20 Human immunodeficiency virus [HIV] disease: Secondary | ICD-10-CM | POA: Diagnosis not present

## 2020-11-22 DIAGNOSIS — F332 Major depressive disorder, recurrent severe without psychotic features: Secondary | ICD-10-CM | POA: Diagnosis not present

## 2020-11-22 DIAGNOSIS — Z20822 Contact with and (suspected) exposure to covid-19: Secondary | ICD-10-CM | POA: Diagnosis not present

## 2020-11-22 NOTE — Progress Notes (Signed)
Pt is still asleep. Respirations are even and unlabored. No distress noted. Pt attended [facility] group therapy. Pt's safety is maintained.

## 2020-11-22 NOTE — ED Notes (Signed)
Pt sleeping at present, no distress noted.  Monitoring for safety. 

## 2020-11-22 NOTE — Progress Notes (Signed)
Pt is presently asleep. No distress noted. Pt is alert and oriented. Pt complained of left leg pain. Administered PRN Tylenol and scheduled medication with no incident. Pt continues to endorse HI towards "everybody." Pt denies SI/AVH at this time. Staff will monitor for pt's safety.

## 2020-11-22 NOTE — ED Notes (Signed)
Pt has taken his medication and is in his room lying down, pt stated that his is not having any current SI/HI and if he does begin to have any harmful thoughts he will let staff know.

## 2020-11-22 NOTE — ED Notes (Signed)
Pt given lunch

## 2020-11-22 NOTE — Progress Notes (Signed)
Pt is asleep. Respirations are even and unlabored. No distress noted. Safety is maintained.

## 2020-11-22 NOTE — Progress Notes (Signed)
Pt is asleep. Respirations are even and unlabored. No signs of acute distress noted. Staff will monitor for pt's safety. 

## 2020-11-22 NOTE — ED Notes (Signed)
Pt given breakfast.
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
## 2020-11-22 NOTE — ED Provider Notes (Addendum)
Behavioral Health Progress Note  Date and Time: 11/22/2020 11:01 AM Name: John [name] MRN:  [id number]  Subjective:    John [name], 50 y.o., male patient seen face to face by this provider, chart reviewed and discussed with treatment team and Dr. Serafina Mitchell on 11/22/20.    On today's reassessment patient is alert and oriented x4.  He makes good eye contact.  Speech is clear, coherent, normal rate and tone.  Denies any concerns with appetite or sleep.  Patient endorses anxiety.  Denies depression. Denies auditory and visual hallucinations.  He does not appear to be responding to internal stimuli.  He denies current suicidal or homicidal ideations.  States last night he had 1 -2 fleeting SI/HI thoughts but they went away.  He would not elaborate any further. States it was too quick to remember. Patient reports he is overall feeling better.  States he is looking forward to being discharged to [facility] on Monday.  Denies any health concerns at this time.  He is tolerating medications without any adverse effects.  He has attended group sessions, reports they have been beneficial.Patient's feelings and progress discussed.  Reassurance, support, and encouragement provided.  Denies any cravings or withdrawal symptoms at this time.  Diagnosis:  Final diagnoses:  Human immunodeficiency virus (HIV) disease ([place])  Severe episode of recurrent major depressive disorder, without psychotic features ([place])  Asymptomatic HIV infection, with no history of HIV-related illness ([place])  PTSD (post-traumatic stress disorder)  Bipolar disorder, curr episode mixed, severe, with psychotic features ([place])  Uncomplicated asthma, unspecified asthma severity, unspecified whether persistent    Total Time spent with patient: 20 minutes  Past Psychiatric History: AUD,Cocaine use disorder, thc use, MDD , Polysubstance abuse. Past Medical History:  Past Medical History:  Diagnosis Date   Alcohol abuse    Alcoholism ([place])     Anxiety    Asthma    Asthma    Bipolar disorder ([place])    Chronic low back pain    Cocaine abuse ([place])    Depression    Gout    Gout    HIV (human immunodeficiency virus infection) ([place])    "dx'd ~ 2 yr ago" (09/29/2012)   HIV (human immunodeficiency virus infection) ([place])    HIV disease ([place])    Homelessness    Hypertension    Marijuana abuse    Mental disorder    Schizophrenia ([place])     Past Surgical History:  Procedure Laterality Date   SKIN GRAFT FULL THICKNESS LEG Left ?   POSTERIOR LEFT LEG  AFTER DOG BITES   Family History:  Family History  Problem Relation Age of Onset   Alcoholism Mother    Depression Mother    Alcoholism Brother    Family Psychiatric  History: unknown Social History:  Social History   Substance and Sexual Activity  Alcohol Use Yes   Alcohol/week: 12.0 standard drinks   Types: 12 Cans of beer per week   Comment: Daily. "As much as I can get."     Social History   Substance and Sexual Activity  Drug Use Yes   Types: Marijuana, "Crack" cocaine   Comment: States been drinking 4-40oz beers daily.    Social History   Socioeconomic History   Marital status: Single    Spouse name: Not on file   Number of children: 0   Years of education: Not on file   Highest education level: 9th grade  Occupational History   Occupation: unemployed  Tobacco Use  Smoking status: Every Day    Packs/day: 0.50    Years: 27.00    Pack years: 13.50    Types: Cigarettes   Smokeless tobacco: Never  Vaping Use   Vaping Use: Never used  Substance and Sexual Activity   Alcohol use: Yes    Alcohol/week: 12.0 standard drinks    Types: 12 Cans of beer per week    Comment: Daily. "As much as I can get."   Drug use: Yes    Types: Marijuana, "Crack" cocaine    Comment: States been drinking 4-40oz beers daily.   Sexual activity: Not Currently    Partners: Male    Birth control/protection: Condom    Comment: given condoms 04/10/20  Other Topics Concern    Not on file  Social History Narrative   ** Merged History Encounter **       ** Merged History Encounter **       Social Determinants of Health   Financial Resource Strain: High Risk   Difficulty of Paying Living Expenses: Very hard  Food Insecurity: Landscape architect Present   Worried About Charity fundraiser in the Last Year: Sometimes true   Arboriculturist in the Last Year: Sometimes true  Transportation Needs: Public librarian (Medical): Yes   Lack of Transportation (Non-Medical): Yes  Physical Activity: Inactive   Days of Exercise per Week: 0 days   Minutes of Exercise per Session: 0 min  Stress: Stress Concern Present   Feeling of Stress : Rather much  Social Connections: Moderately Isolated   Frequency of Communication with Friends and Family: Three times a week   Frequency of Social Gatherings with Friends and Family: Three times a week   Attends Religious Services: Never   Active Member of Clubs or Organizations: Yes   Attends Music therapist: More than 4 times per year   Marital Status: Never married   SDOH:  SDOH Screenings   Alcohol Screen: Medium Risk   Last Alcohol Screening Score (AUDIT): 31  Depression (PHQ2-9): Low Risk    PHQ-2 Score: 0  Financial Resource Strain: High Risk   Difficulty of Paying Living Expenses: Very hard  Food Insecurity: Food Insecurity Present   Worried About Charity fundraiser in the Last Year: Sometimes true   Ran Out of Food in the Last Year: Sometimes true  Housing: Medium Risk   Last Housing Risk Score: 1  Physical Activity: Inactive   Days of Exercise per Week: 0 days   Minutes of Exercise per Session: 0 min  Social Connections: Moderately Isolated   Frequency of Communication with Friends and Family: Three times a week   Frequency of Social Gatherings with Friends and Family: Three times a week   Attends Religious Services: Never   Active Member of Clubs or Organizations:  Yes   Attends Music therapist: More than 4 times per year   Marital Status: Never married  Stress: Stress Concern Present   Feeling of Stress : Rather much  Tobacco Use: High Risk   Smoking Tobacco Use: Every Day   Smokeless Tobacco Use: Never  Transportation Needs: Unmet Transportation Needs   Lack of Transportation (Medical): Yes   Lack of Transportation (Non-Medical): Yes   Additional Social History:    Pain Medications: See MAR Prescriptions: See MAR Over the Counter: See MAR History of alcohol / drug use?: Yes Longest period of sobriety (when/how long): 2 days Negative Consequences  of Use: Personal relationships, Financial Withdrawal Symptoms: Agitation   Sleep: Good  Appetite:  Good  Current Medications:  Current Facility-Administered Medications  Medication Dose Route Frequency Provider Last Rate Last Admin   acetaminophen (TYLENOL) tablet 650 mg  650 mg Oral Q6H PRN Ival Bible, MD   650 mg at 11/22/20 1012   albuterol (VENTOLIN HFA) 108 (90 Base) MCG/ACT inhaler 2 puff  2 puff Inhalation Q4H PRN Ival Bible, MD   2 puff at 11/19/20 2053   alum & mag hydroxide-simeth (MAALOX/MYLANTA) 200-200-20 MG/5ML suspension 30 mL  30 mL Oral Q4H PRN Ival Bible, MD       Darunavir-Cobicistat-Emtricitabine-Tenofovir Alafenamide (SYMTUZA) 800-150-200-10 MG TABS 1 tablet  1 tablet Oral q morning Ival Bible, MD   1 tablet at 11/22/20 1012   hydrOXYzine (ATARAX/VISTARIL) tablet 25 mg  25 mg Oral TID PRN Ival Bible, MD       magnesium hydroxide (MILK OF MAGNESIA) suspension 30 mL  30 mL Oral Daily PRN Ival Bible, MD       mirtazapine (REMERON) tablet 15 mg  15 mg Oral QHS Ival Bible, MD   15 mg at 11/21/20 2204   QUEtiapine (SEROQUEL) tablet 100 mg  100 mg Oral QHS Ival Bible, MD   100 mg at 11/21/20 2203   traZODone (DESYREL) tablet 100 mg  100 mg Oral QHS PRN Ival Bible, MD        Current Outpatient Medications  Medication Sig Dispense Refill   albuterol (VENTOLIN HFA) 108 (90 Base) MCG/ACT inhaler Inhale 2 puffs into the lungs every 4 (four) hours as needed for wheezing or shortness of breath. 18 g 0   mirtazapine (REMERON) 15 MG tablet Take 1 tablet (15 mg total) by mouth at bedtime. 30 tablet 0   QUEtiapine (SEROQUEL) 100 MG tablet Take 1 tablet (100 mg total) by mouth at bedtime. 30 tablet 0   SYMTUZA 800-150-200-10 MG TABS Take 1 tablet by mouth every morning. 30 tablet 0   traZODone (DESYREL) 100 MG tablet Take 1 tablet (100 mg total) by mouth at bedtime as needed for sleep. 30 tablet 0    Labs  Lab Results:  Admission on 11/19/2020  Component Date Value Ref Range Status   SARS Coronavirus 2 by RT PCR 11/19/2020 NEGATIVE  NEGATIVE Final   Comment: (NOTE) SARS-CoV-2 target nucleic acids are NOT DETECTED.  The SARS-CoV-2 RNA is generally detectable in upper respiratory specimens during the acute phase of infection. The lowest concentration of SARS-CoV-2 viral copies this assay can detect is 138 copies/mL. A negative result does not preclude SARS-Cov-2 infection and should not be used as the sole basis for treatment or other patient management decisions. A negative result may occur with  improper specimen collection/handling, submission of specimen other than nasopharyngeal swab, presence of viral mutation(s) within the areas targeted by this assay, and inadequate number of viral copies(<138 copies/mL). A negative result must be combined with clinical observations, patient history, and epidemiological information. The expected result is Negative.  Fact Sheet for Patients:  EntrepreneurPulse.com.au  Fact Sheet for Healthcare Providers:  IncredibleEmployment.be  This test is no                          t yet approved or cleared by the [place] FDA and  has been authorized for detection and/or diagnosis of  SARS-CoV-2 by FDA under an Emergency Use Authorization (EUA). This  EUA will remain  in effect (meaning this test can be used) for the duration of the COVID-19 declaration under Section 564(b)(1) of the Act, 21 U.S.C.section 360bbb-3(b)(1), unless the authorization is terminated  or revoked sooner.       Influenza A by PCR 11/19/2020 NEGATIVE  NEGATIVE Final   Influenza B by PCR 11/19/2020 NEGATIVE  NEGATIVE Final   Comment: (NOTE) The Xpert Xpress SARS-CoV-2/FLU/RSV plus assay is intended as an aid in the diagnosis of influenza from Nasopharyngeal swab specimens and should not be used as a sole basis for treatment. Nasal washings and aspirates are unacceptable for Xpert Xpress SARS-CoV-2/FLU/RSV testing.  Fact Sheet for Patients: EntrepreneurPulse.com.au  Fact Sheet for Healthcare Providers: IncredibleEmployment.be  This test is not yet approved or cleared by the [place] FDA and has been authorized for detection and/or diagnosis of SARS-CoV-2 by FDA under an Emergency Use Authorization (EUA). This EUA will remain in effect (meaning this test can be used) for the duration of the COVID-19 declaration under Section 564(b)(1) of the Act, 21 U.S.C. section 360bbb-3(b)(1), unless the authorization is terminated or revoked.  Performed at [facility], [place] [address], [place], [place] [address]    WBC 11/19/2020 5.0  4.0 - 10.5 K/uL Final   RBC 11/19/2020 4.15 (A) 4.22 - 5.81 MIL/uL Final   Hemoglobin 11/19/2020 13.4  13.0 - 17.0 g/dL Final   HCT 11/19/2020 39.9  39.0 - 52.0 % Final   MCV 11/19/2020 96.1  80.0 - 100.0 fL Final   MCH 11/19/2020 32.3  26.0 - 34.0 pg Final   MCHC 11/19/2020 33.6  30.0 - 36.0 g/dL Final   RDW 11/19/2020 12.6  11.5 - 15.5 % Final   Platelets 11/19/2020 360  150 - 400 K/uL Final   nRBC 11/19/2020 0.0  0.0 - 0.2 % Final   Neutrophils Relative % 11/19/2020 42  % Final   Neutro Abs 11/19/2020 2.1  1.7 - 7.7  K/uL Final   Lymphocytes Relative 11/19/2020 39  % Final   Lymphs Abs 11/19/2020 1.9  0.7 - 4.0 K/uL Final   Monocytes Relative 11/19/2020 13  % Final   Monocytes Absolute 11/19/2020 0.7  0.1 - 1.0 K/uL Final   Eosinophils Relative 11/19/2020 5  % Final   Eosinophils Absolute 11/19/2020 0.2  0.0 - 0.5 K/uL Final   Basophils Relative 11/19/2020 1  % Final   Basophils Absolute 11/19/2020 0.0  0.0 - 0.1 K/uL Final   Immature Granulocytes 11/19/2020 0  % Final   Abs Immature Granulocytes 11/19/2020 0.02  0.00 - 0.07 K/uL Final   Performed at [facility], [place] [address], [place], [place] [address]   Sodium 11/19/2020 137  135 - 145 mmol/L Final   Potassium 11/19/2020 4.1  3.5 - 5.1 mmol/L Final   Chloride 11/19/2020 103  98 - 111 mmol/L Final   CO2 11/19/2020 24  22 - 32 mmol/L Final   Glucose, Bld 11/19/2020 81  70 - 99 mg/dL Final   Glucose reference range applies only to samples taken after fasting for at least 8 hours.   BUN 11/19/2020 9  6 - 20 mg/dL Final   Creatinine, Ser 11/19/2020 0.94  0.61 - 1.24 mg/dL Final   Calcium 11/19/2020 9.3  8.9 - 10.3 mg/dL Final   Total Protein 11/19/2020 7.0  6.5 - 8.1 g/dL Final   Albumin 11/19/2020 4.0  3.5 - 5.0 g/dL Final   AST 11/19/2020 24  15 - 41 U/L Final  ALT 11/19/2020 17  0 - 44 U/L Final   Alkaline Phosphatase 11/19/2020 74  38 - 126 U/L Final   Total Bilirubin 11/19/2020 0.6  0.3 - 1.2 mg/dL Final   GFR, Estimated 11/19/2020 >60  >60 mL/min Final   Comment: (NOTE) Calculated using the CKD-EPI Creatinine Equation (2021)    Anion gap 11/19/2020 10  5 - 15 Final   Performed at [facility], [place] [address] [place],  [address]   Alcohol, Ethyl (B) 11/19/2020 <10  <10 mg/dL Final   Comment: (NOTE) Lowest detectable limit for serum alcohol is 10 mg/dL.  For medical purposes only. Performed at [facility], [place] [address] [place], [place] [address]    POC Amphetamine UR 11/20/2020 None Detected  NONE  DETECTED (Cut Off Level 1000 ng/mL) Final   POC Secobarbital (BAR) 11/20/2020 None Detected  NONE DETECTED (Cut Off Level 300 ng/mL) Final   POC Buprenorphine (BUP) 11/20/2020 None Detected  NONE DETECTED (Cut Off Level 10 ng/mL) Final   POC Oxazepam (BZO) 11/20/2020 None Detected  NONE DETECTED (Cut Off Level 300 ng/mL) Final   POC Cocaine UR 11/20/2020 Positive (A) NONE DETECTED (Cut Off Level 300 ng/mL) Final   POC Methamphetamine UR 11/20/2020 None Detected  NONE DETECTED (Cut Off Level 1000 ng/mL) Final   POC Morphine 11/20/2020 None Detected  NONE DETECTED (Cut Off Level 300 ng/mL) Final   POC Oxycodone UR 11/20/2020 None Detected  NONE DETECTED (Cut Off Level 100 ng/mL) Final   POC Methadone UR 11/20/2020 None Detected  NONE DETECTED (Cut Off Level 300 ng/mL) Final   POC Marijuana UR 11/20/2020 Positive (A) NONE DETECTED (Cut Off Level 50 ng/mL) Final   SARS Coronavirus 2 Ag 11/19/2020 Negative  Negative Final   SARSCOV2ONAVIRUS 2 AG 11/19/2020 NEGATIVE  NEGATIVE Final   Comment: (NOTE) SARS-CoV-2 antigen NOT DETECTED.   Negative results are presumptive.  Negative results do not preclude SARS-CoV-2 infection and should not be used as the sole basis for treatment or other patient management decisions, including infection  control decisions, particularly in the presence of clinical signs and  symptoms consistent with COVID-19, or in those who have been in contact with the virus.  Negative results must be combined with clinical observations, patient history, and epidemiological information. The expected result is Negative.  Fact Sheet for Patients: HandmadeRecipes.com.cy  Fact Sheet for Healthcare Providers: FuneralLife.at  This test is not yet approved or cleared by the [place] FDA and  has been authorized for detection and/or diagnosis of SARS-CoV-2 by FDA under an Emergency Use Authorization (EUA).  This EUA will remain in  effect (meaning this test can be used) for the duration of  the COV                          ID-19 declaration under Section 564(b)(1) of the Act, 21 U.S.C. section 360bbb-3(b)(1), unless the authorization is terminated or revoked sooner.    Office Visit on 10/27/2020  Component Date Value Ref Range Status   Glucose, Bld 10/27/2020 87  65 - 99 mg/dL Final   Comment: .            Fasting reference interval .    BUN 10/27/2020 15  7 - 25 mg/dL Final   Creat 10/27/2020 0.98  0.60 - 1.29 mg/dL Final   eGFR 10/27/2020 95  > OR = 60 mL/min/1.53m Final   Comment: The eGFR is based on the  CKD-EPI 2021 equation. To calculate  the new eGFR from a previous Creatinine or Cystatin C result, go to https://www.kidney.org/professionals/ kdoqi/gfr%5Fcalculator    BUN/Creatinine Ratio 53/20/2334 NOT APPLICABLE  6 - 22 (calc) Final   Sodium 10/27/2020 142  135 - 146 mmol/L Final   Potassium 10/27/2020 4.3  3.5 - 5.3 mmol/L Final   Chloride 10/27/2020 104  98 - 110 mmol/L Final   CO2 10/27/2020 29  20 - 32 mmol/L Final   Calcium 10/27/2020 9.6  8.6 - 10.3 mg/dL Final   Total Protein 10/27/2020 7.3  6.1 - 8.1 g/dL Final   Albumin 10/27/2020 4.3  3.6 - 5.1 g/dL Final   Globulin 10/27/2020 3.0  1.9 - 3.7 g/dL (calc) Final   AG Ratio 10/27/2020 1.4  1.0 - 2.5 (calc) Final   Total Bilirubin 10/27/2020 0.6  0.2 - 1.2 mg/dL Final   Alkaline phosphatase (APISO) 10/27/2020 76  36 - 130 U/L Final   AST 10/27/2020 15  10 - 40 U/L Final   ALT 10/27/2020 11  9 - 46 U/L Final   HIV 1 RNA Quant 10/27/2020 Not Detected  Copies/mL Final   HIV-1 RNA Quant, Log 10/27/2020 Not Detected  Log cps/mL Final   Comment: . Reference Range:                           Not Detected     copies/mL                           Not Detected Log copies/mL . Mar[place] Kitchen The test was performed using Real-Time Polymerase Chain Reaction. . . Reportable Range: 20 copies/mL to 10,000,000 copies/mL (1.30 Log copies/mL to 7.00 Log  copies/mL). .    CD4 T Cell Abs 10/27/2020 462  400 - 1,790 /uL Final   CD4 % Helper T Cell 10/27/2020 44  33 - 65 % Final   Performed at [facility], [place] [address], [place], [place] [address]   RPR Ser Ql 10/27/2020 NON-REACTIVE  NON-REACTIVE Final  Admission on 09/17/2020, Discharged on 09/23/2020  Component Date Value Ref Range Status   Opiates 09/18/2020 NONE DETECTED  NONE DETECTED Final   Cocaine 09/18/2020 POSITIVE (A) NONE DETECTED Final   Benzodiazepines 09/18/2020 NONE DETECTED  NONE DETECTED Final   Amphetamines 09/18/2020 NONE DETECTED  NONE DETECTED Final   Tetrahydrocannabinol 09/18/2020 POSITIVE (A) NONE DETECTED Final   Barbiturates 09/18/2020 NONE DETECTED  NONE DETECTED Final   Comment: (NOTE) DRUG SCREEN FOR MEDICAL PURPOSES ONLY.  IF CONFIRMATION IS NEEDED FOR ANY PURPOSE, NOTIFY LAB WITHIN 5 DAYS.  LOWEST DETECTABLE LIMITS FOR URINE DRUG SCREEN Drug Class                     Cutoff (ng/mL) Amphetamine and metabolites    1000 Barbiturate and metabolites    200 Benzodiazepine                 168 Tricyclics and metabolites     300 Opiates and metabolites        300 Cocaine and metabolites        300 THC                            50 Performed at [facility], [place] [address], [place], [place] [address]    Cholesterol 09/18/2020 194  0 - 200 mg/dL Final   Triglycerides 09/18/2020 86  <150 mg/dL Final   HDL 09/18/2020 83  >40 mg/dL Final   Total CHOL/HDL Ratio 09/18/2020 2.3  RATIO Final   VLDL 09/18/2020 17  0 - 40 mg/dL Final   LDL Cholesterol 09/18/2020 94  0 - 99 mg/dL Final   Comment:        Total Cholesterol/HDL:CHD Risk Coronary Heart Disease Risk Table                     Men   Women  1/2 Average Risk   3.4   3.3  Average Risk       5.0   4.4  2 X Average Risk   9.6   7.1  3 X Average Risk  23.4   11.0        Use the calculated Patient Ratio above and the CHD Risk Table to determine the patient's CHD  Risk.        ATP III CLASSIFICATION (LDL):  <100     mg/dL   Optimal  100-129  mg/dL   Near or Above                    Optimal  130-159  mg/dL   Borderline  160-189  mg/dL   High  >190     mg/dL   Very High Performed at [place] [address], [place], [place] [address]    TSH 09/18/2020 5.149 (A) 0.350 - 4.500 uIU/mL Final   Comment: Performed by a 3rd Generation assay with a functional sensitivity of <=0.01 uIU/mL. Performed at [facility], [place] [address] [place], [place] [address]    Hgb A1c MFr Bld 09/18/2020 5.2  4.8 - 5.6 % Final   Comment: (NOTE) Pre diabetes:          5.7%-6.4%  Diabetes:              >6.4%  Glycemic control for   <7.0% adults with diabetes    Mean Plasma Glucose 09/18/2020 102.54  mg/dL Final   Performed at [facility], [place] [address] [place], [place] [address]   Free T4 09/18/2020 0.69  0.61 - 1.12 ng/dL Final   Comment: (NOTE) Biotin ingestion may interfere with free T4 tests. If the results are inconsistent with the TSH level, previous test results, or the clinical presentation, then consider biotin interference. If needed, order repeat testing after stopping biotin. Performed at [facility], [place] [address], [place], [place] [address]    T3, Free 09/21/2020 2.2  2.0 - 4.4 pg/mL Final   Comment: (NOTE) Performed At: [facility] [place], [place] [id number] Rush Farmer MD TD:[id number]   Admission on 09/16/2020, Discharged on 09/17/2020  Component Date Value Ref Range Status   SARS Coronavirus 2 by RT PCR 09/16/2020 NEGATIVE  NEGATIVE Final   Comment: (NOTE) SARS-CoV-2 target nucleic acids are NOT DETECTED.  The SARS-CoV-2 RNA is generally detectable in upper respiratory specimens during the acute phase of infection. The lowest concentration of SARS-CoV-2 viral copies this assay can detect is 138 copies/mL. A negative result does not preclude  SARS-Cov-2 infection and should not be used as the sole basis for treatment or other patient management decisions. A negative result may occur with  improper specimen collection/handling, submission of specimen other than nasopharyngeal swab, presence of viral mutation(s) within the areas targeted by this assay, and  inadequate number of viral copies(<138 copies/mL). A negative result must be combined with clinical observations, patient history, and epidemiological information. The expected result is Negative.  Fact Sheet for Patients:  EntrepreneurPulse.com.au  Fact Sheet for Healthcare Providers:  IncredibleEmployment.be  This test is no                          t yet approved or cleared by the [place] FDA and  has been authorized for detection and/or diagnosis of SARS-CoV-2 by FDA under an Emergency Use Authorization (EUA). This EUA will remain  in effect (meaning this test can be used) for the duration of the COVID-19 declaration under Section 564(b)(1) of the Act, 21 U.S.C.section 360bbb-3(b)(1), unless the authorization is terminated  or revoked sooner.       Influenza A by PCR 09/16/2020 NEGATIVE  NEGATIVE Final   Influenza B by PCR 09/16/2020 NEGATIVE  NEGATIVE Final   Comment: (NOTE) The Xpert Xpress SARS-CoV-2/FLU/RSV plus assay is intended as an aid in the diagnosis of influenza from Nasopharyngeal swab specimens and should not be used as a sole basis for treatment. Nasal washings and aspirates are unacceptable for Xpert Xpress SARS-CoV-2/FLU/RSV testing.  Fact Sheet for Patients: EntrepreneurPulse.com.au  Fact Sheet for Healthcare Providers: IncredibleEmployment.be  This test is not yet approved or cleared by the [place] FDA and has been authorized for detection and/or diagnosis of SARS-CoV-2 by FDA under an Emergency Use Authorization (EUA). This EUA will remain in effect  (meaning this test can be used) for the duration of the COVID-19 declaration under Section 564(b)(1) of the Act, 21 U.S.C. section 360bbb-3(b)(1), unless the authorization is terminated or revoked.  Performed at [facility], [place] [address] [place], [place] [address]    Sodium 09/16/2020 139  135 - 145 mmol/L Final   Potassium 09/16/2020 4.1  3.5 - 5.1 mmol/L Final   Chloride 09/16/2020 104  98 - 111 mmol/L Final   CO2 09/16/2020 24  22 - 32 mmol/L Final   Glucose, Bld 09/16/2020 60 (A) 70 - 99 mg/dL Final   Glucose reference range applies only to samples taken after fasting for at least 8 hours.   BUN 09/16/2020 13  6 - 20 mg/dL Final   Creatinine, Ser 09/16/2020 1.18  0.61 - 1.24 mg/dL Final   Calcium 09/16/2020 9.4  8.9 - 10.3 mg/dL Final   Total Protein 09/16/2020 7.1  6.5 - 8.1 g/dL Final   Albumin 09/16/2020 4.0  3.5 - 5.0 g/dL Final   AST 09/16/2020 23  15 - 41 U/L Final   ALT 09/16/2020 17  0 - 44 U/L Final   Alkaline Phosphatase 09/16/2020 91  38 - 126 U/L Final   Total Bilirubin 09/16/2020 0.4  0.3 - 1.2 mg/dL Final   GFR, Estimated 09/16/2020 >60  >60 mL/min Final   Comment: (NOTE) Calculated using the CKD-EPI Creatinine Equation (2021)    Anion gap 09/16/2020 11  5 - 15 Final   Performed at [place] [address] [place], [place] [id number]   Alcohol, Ethyl (B) 09/16/2020 <10  <10 mg/dL Final   Comment: (NOTE) Lowest detectable limit for serum alcohol is 10 mg/dL.  For medical purposes only. Performed at [facility], [place] [address], [place], [place] [address]    WBC 09/16/2020 4.0  4.0 - 10.5 K/uL Final   RBC 09/16/2020 4.75  4.22 - 5.81 MIL/uL Final   Hemoglobin 09/16/2020 15.6  13.0 - 17.0 g/dL Final   HCT 09/16/2020 47.7  39.0 - 52.0 % Final   MCV 09/16/2020 100.4 (A) 80.0 - 100.0 fL Final   MCH 09/16/2020 32.8  26.0 - 34.0 pg Final   MCHC 09/16/2020 32.7  30.0 - 36.0 g/dL Final   RDW 09/16/2020 12.6  11.5 - 15.5 % Final    Platelets 09/16/2020 405 (A) 150 - 400 K/uL Final   nRBC 09/16/2020 0.0  0.0 - 0.2 % Final   Neutrophils Relative % 09/16/2020 18  % Final   Neutro Abs 09/16/2020 0.7 (A) 1.7 - 7.7 K/uL Final   Lymphocytes Relative 09/16/2020 59  % Final   Lymphs Abs 09/16/2020 2.4  0.7 - 4.0 K/uL Final   Monocytes Relative 09/16/2020 8  % Final   Monocytes Absolute 09/16/2020 0.3  0.1 - 1.0 K/uL Final   Eosinophils Relative 09/16/2020 12  % Final   Eosinophils Absolute 09/16/2020 0.5  0.0 - 0.5 K/uL Final   Basophils Relative 09/16/2020 3  % Final   Basophils Absolute 09/16/2020 0.1  0.0 - 0.1 K/uL Final   WBC Morphology 09/16/2020 See Note   Final   Smudge Cells   nRBC 09/16/2020 0  0 /100 WBC Final   Abs Immature Granulocytes 09/16/2020 0.00  0.00 - 0.07 K/uL Final   Performed at [facility], [place] [address] [place], [place] [address]   Path Review 09/16/2020 NEUTROPENIA   Final   Comment: Reviewed by Chrystie Nose. Saralyn Pilar, M.D. 09/17/2020 Performed at [facility], [place] [address], [place], [place] [address]    Glucose-Capillary 09/16/2020 107 (A) 70 - 99 mg/dL Final   Glucose reference range applies only to samples taken after fasting for at least 8 hours.  Admission on 08/20/2020, Discharged on 08/21/2020  Component Date Value Ref Range Status   SARS Coronavirus 2 by RT PCR 08/20/2020 POSITIVE (A) NEGATIVE Final   Comment: RESULT CALLED TO, READ BACK BY AND VERIFIED WITH: RN JASMINE J. [id number] FCP (NOTE) SARS-CoV-2 target nucleic acids are DETECTED.  The SARS-CoV-2 RNA is generally detectable in upper respiratory specimens during the acute phase of infection. Positive results are indicative of the presence of the identified virus, but do not rule out bacterial infection or co-infection with other pathogens not detected by the test. Clinical correlation with patient history and other diagnostic information is necessary to determine patient infection status. The expected result is  Negative.  Fact Sheet for Patients: EntrepreneurPulse.com.au  Fact Sheet for Healthcare Providers: IncredibleEmployment.be  This test is not yet approved or cleared by the [place] FDA and  has been authorized for detection and/or diagnosis of SARS-CoV-2 by FDA under an Emergency Use Authorization (EUA).  This EUA will remain in effect (meaning this test can be use                          d) for the duration of  the COVID-19 declaration under Section 564(b)(1) of the Act, 21 U.S.C. section 360bbb-3(b)(1), unless the authorization is terminated or revoked sooner.     Influenza A by PCR 08/20/2020 NEGATIVE  NEGATIVE Final   Influenza B by PCR 08/20/2020 NEGATIVE  NEGATIVE Final   Comment: (NOTE) The Xpert Xpress SARS-CoV-2/FLU/RSV plus assay is intended as an aid in the diagnosis of influenza from Nasopharyngeal swab specimens and should not be used as a sole basis for treatment. Nasal washings and aspirates are unacceptable for Xpert Xpress  SARS-CoV-2/FLU/RSV testing.  Fact Sheet for Patients: EntrepreneurPulse.com.au  Fact Sheet for Healthcare Providers: IncredibleEmployment.be  This test is not yet approved or cleared by the [place] FDA and has been authorized for detection and/or diagnosis of SARS-CoV-2 by FDA under an Emergency Use Authorization (EUA). This EUA will remain in effect (meaning this test can be used) for the duration of the COVID-19 declaration under Section 564(b)(1) of the Act, 21 U.S.C. section 360bbb-3(b)(1), unless the authorization is terminated or revoked.  Performed at [facility], [place] [address] [place], [place] [address]   Office Visit on 08/07/2020  Component Date Value Ref Range Status   Sed Rate 08/07/2020 6  0 - 15 mm/h Final   CRP 08/07/2020 4.3  <8.0 mg/L Final   HIV 1 RNA Quant 08/07/2020 Not Detected  Copies/mL Final   HIV-1 RNA Quant, Log  08/07/2020 Not Detected  Log cps/mL Final   Comment: . Reference Range:                           Not Detected     copies/mL                           Not Detected Log copies/mL . Marland Kitchen The test was performed using Real-Time Polymerase Chain Reaction. . . Reportable Range: 20 copies/mL to 10,000,000 copies/mL (1.30 Log copies/mL to 7.00 Log copies/mL). .   Office Visit on 07/10/2020  Component Date Value Ref Range Status   Glucose, Bld 07/10/2020 66  65 - 99 mg/dL Final   Comment: .            Fasting reference interval .    BUN 07/10/2020 14  7 - 25 mg/dL Final   Creat 07/10/2020 0.83  0.60 - 1.35 mg/dL Final   GFR, Est Non African American 07/10/2020 103  > OR = 60 mL/min/1.73m Final   GFR, Est African American 07/10/2020 120  > OR = 60 mL/min/1.724mFinal   BUN/Creatinine Ratio 0483/25/4982OT APPLICABLE  6 - 22 (calc) Final   Sodium 07/10/2020 139  135 - 146 mmol/L Final   Potassium 07/10/2020 4.2  3.5 - 5.3 mmol/L Final   Chloride 07/10/2020 102  98 - 110 mmol/L Final   CO2 07/10/2020 24  20 - 32 mmol/L Final   Calcium 07/10/2020 9.5  8.6 - 10.3 mg/dL Final   Total Protein 07/10/2020 7.4  6.1 - 8.1 g/dL Final   Albumin 07/10/2020 4.8  3.6 - 5.1 g/dL Final   Globulin 07/10/2020 2.6  1.9 - 3.7 g/dL (calc) Final   AG Ratio 07/10/2020 1.8  1.0 - 2.5 (calc) Final   Total Bilirubin 07/10/2020 0.3  0.2 - 1.2 mg/dL Final   Alkaline phosphatase (APISO) 07/10/2020 76  36 - 130 U/L Final   AST 07/10/2020 29  10 - 40 U/L Final   ALT 07/10/2020 17  9 - 46 U/L Final   HIV 1 RNA Quant 07/10/2020 Not Detected  Copies/mL Final   HIV-1 RNA Quant, Log 07/10/2020 Not Detected  Log cps/mL Final   Comment: . Reference Range:                           Not Detected     copies/mL  Not Detected Log copies/mL . Marland Kitchen The test was performed using Real-Time Polymerase Chain Reaction. . . Reportable Range: 20 copies/mL to 10,000,000 copies/mL (1.30 Log copies/mL to 7.00  Log copies/mL). .    CD4 T Cell Abs 07/10/2020 1,031  400 - 1,790 /uL Final   CD4 % Helper T Cell 07/10/2020 46  33 - 65 % Final   Performed at [facility], [place] [address] [place], [place] [address]    Blood Alcohol level:  Lab Results  Component Value Date   ETH <10 11/19/2020   ETH <10 97/41/6384    Metabolic Disorder Labs: Lab Results  Component Value Date   HGBA1C 5.2 09/18/2020   MPG 102.54 09/18/2020   MPG 96.8 03/24/2019   Lab Results  Component Value Date   PROLACTIN 6.7 09/29/2012   Lab Results  Component Value Date   CHOL 194 09/18/2020   TRIG 86 09/18/2020   HDL 83 09/18/2020   CHOLHDL 2.3 09/18/2020   VLDL 17 09/18/2020   LDLCALC 94 09/18/2020   LDLCALC 60 04/10/2020    Therapeutic Lab Levels: No results found for: LITHIUM Lab Results  Component Value Date   VALPROATE 59 04/06/2015   VALPROATE 95 11/18/2014   No components found for:  CBMZ  Physical Findings   AIMS    Flowsheet Row Admission (Discharged) from 09/17/2020 in [place] [facility] Admission (Discharged) from 03/23/2019 in [place] [facility] Admission (Discharged) from 03/31/2015 in [place] [facility] Admission (Discharged) from 11/14/2014 in [place] Total Score 0 0 0 0      AUDIT    Flowsheet Row Admission (Discharged) from 09/17/2020 in [place] [facility] Counselor from 05/22/2020 in [facility] Admission (Discharged) from 03/23/2019 in [place] [facility] Admission (Discharged) from 03/31/2015 in [place] [facility] Admission (Discharged) from 11/14/2014 in [place]  Alcohol Use Disorder Identification Test Final Score (AUDIT) 31 32 _0 GAD-7    Flowsheet Row Office Visit from 10/03/2020 in [facility] Office Visit from 08/01/2020 in [facility] Office Visit from 05/22/2020 in [facility]  Total GAD-7 Score _1 PHQ2-9    [place] Office Visit from 10/27/2020 in [facility] for Infectious Disease Office Visit from 10/03/2020 in [facility] Office Visit from 08/07/2020 in [facility] Office Visit from 08/01/2020 in [facility] Office Visit from 05/22/2020 in [facility]  PHQ-2 Total Score 0 4 0 2 5  PHQ-9 Total Score -- 18 -- 13 [place] ED from 11/19/2020 in [facility] ED from 10/19/2020 in [place] Office Visit from 10/03/2020 in [place] CATEGORY High Risk No Risk High Risk        Musculoskeletal  Strength & Muscle Tone: within normal limits Gait & Station: normal Patient leans: N/A  Psychiatric Specialty Exam  Presentation  General Appearance: Appropriate for Environment; Casual  Eye Contact:Good  Speech:Clear and Coherent; Normal Rate  Speech Volume:Normal  Handedness:Right   Mood and Affect  Mood:Anxious  Affect:Appropriate; Congruent   Thought Process  Thought  Processes:Coherent  Descriptions of Associations:Intact  Orientation:Full (Time, Place and Person)  Thought Content:Logical  Diagnosis of Schizophrenia or Schizoaffective disorder in past: No    Hallucinations:Hallucinations: None Description of Command Hallucinations: CAH instructing him to harm himself Description of Visual Hallucinations: blkack spots  Ideas of Reference:None  Suicidal Thoughts:Suicidal Thoughts: No  Homicidal Thoughts:Homicidal Thoughts: No   Sensorium  Memory:Immediate Good; Recent Good; Remote  Good  Judgment:Fair  Insight:Fair   Executive Functions  Concentration:Fair  Attention Span:Good  [place] of Knowledge:Good  Language:Good   Psychomotor Activity  Psychomotor Activity:Psychomotor Activity: Normal   Assets  Assets:Communication Skills; Desire for Improvement; Financial Resources/Insurance; Physical Health; Resilience; Social Support   Sleep  Sleep:Sleep: Fair   No data recorded  Physical Exam  Physical Exam Vitals and nursing note reviewed.  Constitutional:      General: He is not in acute distress.    Appearance: Normal appearance. He is well-developed. He is not ill-appearing.  HENT:     Head: Normocephalic and atraumatic.     Right Ear: External ear normal.     Left Ear: External ear normal.  Eyes:     General:        Right eye: No discharge.        Left eye: No discharge.     Conjunctiva/sclera: Conjunctivae normal.  Cardiovascular:     Rate and Rhythm: Normal rate and regular rhythm.     Heart sounds: No murmur heard. Pulmonary:     Effort: Pulmonary effort is normal. No respiratory distress.     Breath sounds: Normal breath sounds.  Abdominal:     Palpations: Abdomen is soft.     Tenderness: There is no abdominal tenderness.  Musculoskeletal:        General: Normal range of motion.     Cervical back: Normal range of motion and neck supple.  Skin:    General: Skin is warm and dry.     Coloration: Skin is not jaundiced or pale.  Neurological:     Mental Status: He is alert and oriented to person, place, and time.  Psychiatric:        Attention and Perception: Attention and perception normal.        Mood and Affect: Mood is anxious.        Behavior: Behavior normal. Behavior is cooperative.        Thought Content: Thought content normal.        Cognition and Memory: Cognition normal.        Judgment: Judgment is impulsive.   Review of Systems  Constitutional: Negative.  Negative for chills and fever.  HENT:  Negative.  Negative for hearing loss.   Eyes: Negative.   Respiratory: Negative.  Negative for cough.   Cardiovascular: Negative.  Negative for chest pain.  Gastrointestinal: Negative.   Skin: Negative.   Neurological: Negative.   Psychiatric/Behavioral:  The patient is nervous/anxious.   Blood pressure 118/85, pulse 87, temperature (!) 96.9 F (36.1 C), temperature source Tympanic, resp. rate 18, SpO2 100 %. There is no height or weight on file to calculate BMI.  Treatment Plan Summary: Daily contact with patient to assess and evaluate symptoms and progress in treatment and Medication management  Reviewed labwork, UDS +Cocaine +THC. No medication changes at this time.   Patient continues to meet criteria [facility] at [facility].  Continue to plan for discharge to [facility] on Monday 11/24/2020  Revonda Humphrey, NP 11/22/2020 11:01 AM

## 2020-11-23 DIAGNOSIS — F419 Anxiety disorder, unspecified: Secondary | ICD-10-CM | POA: Diagnosis not present

## 2020-11-23 DIAGNOSIS — F332 Major depressive disorder, recurrent severe without psychotic features: Secondary | ICD-10-CM | POA: Diagnosis not present

## 2020-11-23 DIAGNOSIS — B20 Human immunodeficiency virus [HIV] disease: Secondary | ICD-10-CM | POA: Diagnosis not present

## 2020-11-23 DIAGNOSIS — Z20822 Contact with and (suspected) exposure to covid-19: Secondary | ICD-10-CM | POA: Diagnosis not present

## 2020-11-23 NOTE — Progress Notes (Signed)
Patient attended morning group with active participation.

## 2020-11-23 NOTE — ED Notes (Signed)
Pt resting in bed. A&O x4, calm and cooperative. Pt denies current SI. Pt endorses HI and states he is "thinking about strangling people, anyone." Pt endorses AVH and states he is "seeing black spots and hearing voices saying to kill black spots." Pt denies any immediate needs. No signs of acute distress noted. Will continue to monitor for safety.

## 2020-11-23 NOTE — Progress Notes (Signed)
Patient is alert and oriented X 4, denies SI, HI and AVH today. Patient has no complaints, pleasant affect. Patient voiced no concerns of appetite or sleep deficiencies. Patient received scheduled medication.Nursing staff will continue to monitor through out shift.

## 2020-11-23 NOTE — Progress Notes (Signed)
Patient resting in bed with even and unlabored respirations. Nursing staff will continue to monitor.

## 2020-11-23 NOTE — ED Notes (Signed)
Pt is in bed sleeping, respirations even/unlabored, environment check complete/secure, will continue to monitor patient for safety.

## 2020-11-23 NOTE — ED Notes (Signed)
Pt is in bed sleeping, respirations are even/unlabored, environment check complete/secure, will continue to monitor patient for safety

## 2020-11-23 NOTE — ED Provider Notes (Signed)
Behavioral Health Progress Note  Date and Time: 11/23/2020 12:18 PM Name: John [name] MRN:  [id number]  Subjective:  John [name], 50 y.o., male patient seen face to face by this provider, chart reviewed and discussed with treatment team on 11/23/20.   On today's evaluation John [name] is laying in his bed awake.  He sits up during the assessment.  He is alert and oriented x4 and cooperative.  He makes good eye contact.  His speech is clear, coherent, normal rate and tone.  He denies depression at this time.  States he is looking forward to going to [facility] in the am , reports feeling a little anxious about the transition.  Denies any concerns with appetite or sleep.  He does not appear to be responding to internal/external stimuli.  He denies suicidal and homicidal ideations.  Denies auditory and visual hallucination.  He continues to participate in group sessions.  He continues to tolerate medications without adverse reactions.  He denies any health concerns. Patient's feelings and progress discussed.  Reassurance, support, and encouragement provided.   Diagnosis:  Final diagnoses:  Human immunodeficiency virus (HIV) disease ([place])  Severe episode of recurrent major depressive disorder, without psychotic features ([place])  Asymptomatic HIV infection, with no history of HIV-related illness ([place])  PTSD (post-traumatic stress disorder)  Bipolar disorder, curr episode mixed, severe, with psychotic features ([place])  Uncomplicated asthma, unspecified asthma severity, unspecified whether persistent    Total Time spent with patient: 20 minutes  Past Psychiatric History: AUD,Cocaine use disorder, thc use, MDD , Polysubstance abuse. Past Medical History:  Past Medical History:  Diagnosis Date   Alcohol abuse    Alcoholism ([place])    Anxiety    Asthma    Asthma    Bipolar disorder ([place])    Chronic low back pain    Cocaine abuse ([place])    Depression    Gout    Gout    HIV (human  immunodeficiency virus infection) ([place])    "dx'd ~ 2 yr ago" (09/29/2012)   HIV (human immunodeficiency virus infection) ([place])    HIV disease ([place])    Homelessness    Hypertension    Marijuana abuse    Mental disorder    Schizophrenia ([place])     Past Surgical History:  Procedure Laterality Date   SKIN GRAFT FULL THICKNESS LEG Left ?   POSTERIOR LEFT LEG  AFTER DOG BITES   Family History:  Family History  Problem Relation Age of Onset   Alcoholism Mother    Depression Mother    Alcoholism Brother    Family Psychiatric  History: unknown Social History:  Social History   Substance and Sexual Activity  Alcohol Use Yes   Alcohol/week: 12.0 standard drinks   Types: 12 Cans of beer per week   Comment: Daily. "As much as I can get."     Social History   Substance and Sexual Activity  Drug Use Yes   Types: Marijuana, "Crack" cocaine   Comment: States been drinking 4-40oz beers daily.    Social History   Socioeconomic History   Marital status: Single    Spouse name: Not on file   Number of children: 0   Years of education: Not on file   Highest education level: 9th grade  Occupational History   Occupation: unemployed  Tobacco Use   Smoking status: Every Day    Packs/day: 0.50    Years: 27.00    Pack years: 13.50    Types: Cigarettes  Smokeless tobacco: Never  Vaping Use   Vaping Use: Never used  Substance and Sexual Activity   Alcohol use: Yes    Alcohol/week: 12.0 standard drinks    Types: 12 Cans of beer per week    Comment: Daily. "As much as I can get."   Drug use: Yes    Types: Marijuana, "Crack" cocaine    Comment: States been drinking 4-40oz beers daily.   Sexual activity: Not Currently    Partners: Male    Birth control/protection: Condom    Comment: given condoms 04/10/20  Other Topics Concern   Not on file  Social History Narrative   ** Merged History Encounter **       ** Merged History Encounter **       Social Determinants of Health    Financial Resource Strain: High Risk   Difficulty of Paying Living Expenses: Very hard  Food Insecurity: Landscape architect Present   Worried About Charity fundraiser in the Last Year: Sometimes true   Arboriculturist in the Last Year: Sometimes true  Transportation Needs: Public librarian (Medical): Yes   Lack of Transportation (Non-Medical): Yes  Physical Activity: Inactive   Days of Exercise per Week: 0 days   Minutes of Exercise per Session: 0 min  Stress: Stress Concern Present   Feeling of Stress : Rather much  Social Connections: Moderately Isolated   Frequency of Communication with Friends and Family: Three times a week   Frequency of Social Gatherings with Friends and Family: Three times a week   Attends Religious Services: Never   Active Member of Clubs or Organizations: Yes   Attends Music therapist: More than 4 times per year   Marital Status: Never married   SDOH:  SDOH Screenings   Alcohol Screen: Medium Risk   Last Alcohol Screening Score (AUDIT): 31  Depression (PHQ2-9): Low Risk    PHQ-2 Score: 0  Financial Resource Strain: High Risk   Difficulty of Paying Living Expenses: Very hard  Food Insecurity: Food Insecurity Present   Worried About Charity fundraiser in the Last Year: Sometimes true   Ran Out of Food in the Last Year: Sometimes true  Housing: Medium Risk   Last Housing Risk Score: 1  Physical Activity: Inactive   Days of Exercise per Week: 0 days   Minutes of Exercise per Session: 0 min  Social Connections: Moderately Isolated   Frequency of Communication with Friends and Family: Three times a week   Frequency of Social Gatherings with Friends and Family: Three times a week   Attends Religious Services: Never   Active Member of Clubs or Organizations: Yes   Attends Music therapist: More than 4 times per year   Marital Status: Never married  Stress: Stress Concern Present   Feeling  of Stress : Rather much  Tobacco Use: High Risk   Smoking Tobacco Use: Every Day   Smokeless Tobacco Use: Never  Transportation Needs: Unmet Transportation Needs   Lack of Transportation (Medical): Yes   Lack of Transportation (Non-Medical): Yes   Additional Social History:    Pain Medications: See MAR Prescriptions: See MAR Over the Counter: See MAR History of alcohol / drug use?: Yes Longest period of sobriety (when/how long): 2 days Negative Consequences of Use: Personal relationships, Financial Withdrawal Symptoms: Agitation     Sleep: Good  Appetite:  Good  Current Medications:  Current Facility-Administered Medications  Medication  Dose Route Frequency Provider Last Rate Last Admin   acetaminophen (TYLENOL) tablet 650 mg  650 mg Oral Q6H PRN Ival Bible, MD   650 mg at 11/22/20 1012   albuterol (VENTOLIN HFA) 108 (90 Base) MCG/ACT inhaler 2 puff  2 puff Inhalation Q4H PRN Ival Bible, MD   2 puff at 11/19/20 2053   alum & mag hydroxide-simeth (MAALOX/MYLANTA) 200-200-20 MG/5ML suspension 30 mL  30 mL Oral Q4H PRN Ival Bible, MD       Darunavir-Cobicistat-Emtricitabine-Tenofovir Alafenamide (SYMTUZA) 800-150-200-10 MG TABS 1 tablet  1 tablet Oral q morning Ival Bible, MD   1 tablet at 11/23/20 0962   hydrOXYzine (ATARAX/VISTARIL) tablet 25 mg  25 mg Oral TID PRN Ival Bible, MD       magnesium hydroxide (MILK OF MAGNESIA) suspension 30 mL  30 mL Oral Daily PRN Ival Bible, MD       mirtazapine (REMERON) tablet 15 mg  15 mg Oral QHS Ival Bible, MD   15 mg at 11/22/20 2131   QUEtiapine (SEROQUEL) tablet 100 mg  100 mg Oral QHS Ival Bible, MD   100 mg at 11/22/20 2131   traZODone (DESYREL) tablet 100 mg  100 mg Oral QHS PRN Ival Bible, MD       Current Outpatient Medications  Medication Sig Dispense Refill   albuterol (VENTOLIN HFA) 108 (90 Base) MCG/ACT inhaler Inhale 2 puffs into the lungs  every 4 (four) hours as needed for wheezing or shortness of breath. 18 g 0   mirtazapine (REMERON) 15 MG tablet Take 1 tablet (15 mg total) by mouth at bedtime. 30 tablet 0   QUEtiapine (SEROQUEL) 100 MG tablet Take 1 tablet (100 mg total) by mouth at bedtime. 30 tablet 0   SYMTUZA 800-150-200-10 MG TABS Take 1 tablet by mouth every morning. 30 tablet 0   traZODone (DESYREL) 100 MG tablet Take 1 tablet (100 mg total) by mouth at bedtime as needed for sleep. 30 tablet 0    Labs  Lab Results:  Admission on 11/19/2020  Component Date Value Ref Range Status   SARS Coronavirus 2 by RT PCR 11/19/2020 NEGATIVE  NEGATIVE Final   Comment: (NOTE) SARS-CoV-2 target nucleic acids are NOT DETECTED.  The SARS-CoV-2 RNA is generally detectable in upper respiratory specimens during the acute phase of infection. The lowest concentration of SARS-CoV-2 viral copies this assay can detect is 138 copies/mL. A negative result does not preclude SARS-Cov-2 infection and should not be used as the sole basis for treatment or other patient management decisions. A negative result may occur with  improper specimen collection/handling, submission of specimen other than nasopharyngeal swab, presence of viral mutation(s) within the areas targeted by this assay, and inadequate number of viral copies(<138 copies/mL). A negative result must be combined with clinical observations, patient history, and epidemiological information. The expected result is Negative.  Fact Sheet for Patients:  EntrepreneurPulse.com.au  Fact Sheet for Healthcare Providers:  IncredibleEmployment.be  This test is no                          t yet approved or cleared by the [place] FDA and  has been authorized for detection and/or diagnosis of SARS-CoV-2 by FDA under an Emergency Use Authorization (EUA). This EUA will remain  in effect (meaning this test can be used) for the duration of the COVID-19  declaration under Section 564(b)(1) of the Act,  21 U.S.C.section 360bbb-3(b)(1), unless the authorization is terminated  or revoked sooner.       Influenza A by PCR 11/19/2020 NEGATIVE  NEGATIVE Final   Influenza B by PCR 11/19/2020 NEGATIVE  NEGATIVE Final   Comment: (NOTE) The Xpert Xpress SARS-CoV-2/FLU/RSV plus assay is intended as an aid in the diagnosis of influenza from Nasopharyngeal swab specimens and should not be used as a sole basis for treatment. Nasal washings and aspirates are unacceptable for Xpert Xpress SARS-CoV-2/FLU/RSV testing.  Fact Sheet for Patients: EntrepreneurPulse.com.au  Fact Sheet for Healthcare Providers: IncredibleEmployment.be  This test is not yet approved or cleared by the [place] FDA and has been authorized for detection and/or diagnosis of SARS-CoV-2 by FDA under an Emergency Use Authorization (EUA). This EUA will remain in effect (meaning this test can be used) for the duration of the COVID-19 declaration under Section 564(b)(1) of the Act, 21 U.S.C. section 360bbb-3(b)(1), unless the authorization is terminated or revoked.  Performed at [name], [place] [address], [place], [place] [address]    WBC 11/19/2020 5.0  4.0 - 10.5 K/uL Final   RBC 11/19/2020 4.15 (A) 4.22 - 5.81 MIL/uL Final   Hemoglobin 11/19/2020 13.4  13.0 - 17.0 g/dL Final   HCT 11/19/2020 39.9  39.0 - 52.0 % Final   MCV 11/19/2020 96.1  80.0 - 100.0 fL Final   MCH 11/19/2020 32.3  26.0 - 34.0 pg Final   MCHC 11/19/2020 33.6  30.0 - 36.0 g/dL Final   RDW 11/19/2020 12.6  11.5 - 15.5 % Final   Platelets 11/19/2020 360  150 - 400 K/uL Final   nRBC 11/19/2020 0.0  0.0 - 0.2 % Final   Neutrophils Relative % 11/19/2020 42  % Final   Neutro Abs 11/19/2020 2.1  1.7 - 7.7 K/uL Final   Lymphocytes Relative 11/19/2020 39  % Final   Lymphs Abs 11/19/2020 1.9  0.7 - 4.0 K/uL Final   Monocytes Relative 11/19/2020 13  % Final    Monocytes Absolute 11/19/2020 0.7  0.1 - 1.0 K/uL Final   Eosinophils Relative 11/19/2020 5  % Final   Eosinophils Absolute 11/19/2020 0.2  0.0 - 0.5 K/uL Final   Basophils Relative 11/19/2020 1  % Final   Basophils Absolute 11/19/2020 0.0  0.0 - 0.1 K/uL Final   Immature Granulocytes 11/19/2020 0  % Final   Abs Immature Granulocytes 11/19/2020 0.02  0.00 - 0.07 K/uL Final   Performed at [facility], [place] [address] [place], [place] [address]   Sodium 11/19/2020 137  135 - 145 mmol/L Final   Potassium 11/19/2020 4.1  3.5 - 5.1 mmol/L Final   Chloride 11/19/2020 103  98 - 111 mmol/L Final   CO2 11/19/2020 24  22 - 32 mmol/L Final   Glucose, Bld 11/19/2020 81  70 - 99 mg/dL Final   Glucose reference range applies only to samples taken after fasting for at least 8 hours.   BUN 11/19/2020 9  6 - 20 mg/dL Final   Creatinine, Ser 11/19/2020 0.94  0.61 - 1.24 mg/dL Final   Calcium 11/19/2020 9.3  8.9 - 10.3 mg/dL Final   Total Protein 11/19/2020 7.0  6.5 - 8.1 g/dL Final   Albumin 11/19/2020 4.0  3.5 - 5.0 g/dL Final   AST 11/19/2020 24  15 - 41 U/L Final   ALT 11/19/2020 17  0 - 44 U/L Final   Alkaline Phosphatase 11/19/2020 74  38 - 126 U/L Final  Total Bilirubin 11/19/2020 0.6  0.3 - 1.2 mg/dL Final   GFR, Estimated 11/19/2020 >60  >60 mL/min Final   Comment: (NOTE) Calculated using the CKD-EPI Creatinine Equation (2021)    Anion gap 11/19/2020 10  5 - 15 Final   Performed at [facility], [place] [address], [place], [place] [address]   Alcohol, Ethyl (B) 11/19/2020 <10  <10 mg/dL Final   Comment: (NOTE) Lowest detectable limit for serum alcohol is 10 mg/dL.  For medical purposes only. Performed at [facility], [place] [address] [place], [place] [address]    POC Amphetamine UR 11/20/2020 None Detected  NONE DETECTED (Cut Off Level 1000 ng/mL) Final   POC Secobarbital (BAR) 11/20/2020 None Detected  NONE DETECTED (Cut Off Level 300 ng/mL) Final   POC Buprenorphine  (BUP) 11/20/2020 None Detected  NONE DETECTED (Cut Off Level 10 ng/mL) Final   POC Oxazepam (BZO) 11/20/2020 None Detected  NONE DETECTED (Cut Off Level 300 ng/mL) Final   POC Cocaine UR 11/20/2020 Positive (A) NONE DETECTED (Cut Off Level 300 ng/mL) Final   POC Methamphetamine UR 11/20/2020 None Detected  NONE DETECTED (Cut Off Level 1000 ng/mL) Final   POC Morphine 11/20/2020 None Detected  NONE DETECTED (Cut Off Level 300 ng/mL) Final   POC Oxycodone UR 11/20/2020 None Detected  NONE DETECTED (Cut Off Level 100 ng/mL) Final   POC Methadone UR 11/20/2020 None Detected  NONE DETECTED (Cut Off Level 300 ng/mL) Final   POC Marijuana UR 11/20/2020 Positive (A) NONE DETECTED (Cut Off Level 50 ng/mL) Final   SARS Coronavirus 2 Ag 11/19/2020 Negative  Negative Final   SARSCOV2ONAVIRUS 2 AG 11/19/2020 NEGATIVE  NEGATIVE Final   Comment: (NOTE) SARS-CoV-2 antigen NOT DETECTED.   Negative results are presumptive.  Negative results do not preclude SARS-CoV-2 infection and should not be used as the sole basis for treatment or other patient management decisions, including infection  control decisions, particularly in the presence of clinical signs and  symptoms consistent with COVID-19, or in those who have been in contact with the virus.  Negative results must be combined with clinical observations, patient history, and epidemiological information. The expected result is Negative.  Fact Sheet for Patients: HandmadeRecipes.com.cy  Fact Sheet for Healthcare Providers: FuneralLife.at  This test is not yet approved or cleared by the [place] FDA and  has been authorized for detection and/or diagnosis of SARS-CoV-2 by FDA under an Emergency Use Authorization (EUA).  This EUA will remain in effect (meaning this test can be used) for the duration of  the COV                          ID-19 declaration under Section 564(b)(1) of the Act, 21 U.S.C.  section 360bbb-3(b)(1), unless the authorization is terminated or revoked sooner.    Office Visit on 10/27/2020  Component Date Value Ref Range Status   Glucose, Bld 10/27/2020 87  65 - 99 mg/dL Final   Comment: .            Fasting reference interval .    BUN 10/27/2020 15  7 - 25 mg/dL Final   Creat 10/27/2020 0.98  0.60 - 1.29 mg/dL Final   eGFR 10/27/2020 95  > OR = 60 mL/min/1.7m Final   Comment: The eGFR is based on the CKD-EPI 2021 equation. To calculate  the new eGFR from a previous Creatinine or Cystatin C result, go to https://www.kidney.org/professionals/ kdoqi/gfr%5Fcalculator  BUN/Creatinine Ratio 52/77/8242 NOT APPLICABLE  6 - 22 (calc) Final   Sodium 10/27/2020 142  135 - 146 mmol/L Final   Potassium 10/27/2020 4.3  3.5 - 5.3 mmol/L Final   Chloride 10/27/2020 104  98 - 110 mmol/L Final   CO2 10/27/2020 29  20 - 32 mmol/L Final   Calcium 10/27/2020 9.6  8.6 - 10.3 mg/dL Final   Total Protein 10/27/2020 7.3  6.1 - 8.1 g/dL Final   Albumin 10/27/2020 4.3  3.6 - 5.1 g/dL Final   Globulin 10/27/2020 3.0  1.9 - 3.7 g/dL (calc) Final   AG Ratio 10/27/2020 1.4  1.0 - 2.5 (calc) Final   Total Bilirubin 10/27/2020 0.6  0.2 - 1.2 mg/dL Final   Alkaline phosphatase (APISO) 10/27/2020 76  36 - 130 U/L Final   AST 10/27/2020 15  10 - 40 U/L Final   ALT 10/27/2020 11  9 - 46 U/L Final   HIV 1 RNA Quant 10/27/2020 Not Detected  Copies/mL Final   HIV-1 RNA Quant, Log 10/27/2020 Not Detected  Log cps/mL Final   Comment: . Reference Range:                           Not Detected     copies/mL                           Not Detected Log copies/mL . Marland Kitchen The test was performed using Real-Time Polymerase Chain Reaction. . . Reportable Range: 20 copies/mL to 10,000,000 copies/mL (1.30 Log copies/mL to 7.00 Log copies/mL). .    CD4 T Cell Abs 10/27/2020 462  400 - 1,790 /uL Final   CD4 % Helper T Cell 10/27/2020 44  33 - 65 % Final   Performed at [facility], [place] [address] [place], [place] [address]   RPR Ser Ql 10/27/2020 NON-REACTIVE  NON-REACTIVE Final  Admission on 09/17/2020, Discharged on 09/23/2020  Component Date Value Ref Range Status   Opiates 09/18/2020 NONE DETECTED  NONE DETECTED Final   Cocaine 09/18/2020 POSITIVE (A) NONE DETECTED Final   Benzodiazepines 09/18/2020 NONE DETECTED  NONE DETECTED Final   Amphetamines 09/18/2020 NONE DETECTED  NONE DETECTED Final   Tetrahydrocannabinol 09/18/2020 POSITIVE (A) NONE DETECTED Final   Barbiturates 09/18/2020 NONE DETECTED  NONE DETECTED Final   Comment: (NOTE) DRUG SCREEN FOR MEDICAL PURPOSES ONLY.  IF CONFIRMATION IS NEEDED FOR ANY PURPOSE, NOTIFY LAB WITHIN 5 DAYS.  LOWEST DETECTABLE LIMITS FOR URINE DRUG SCREEN Drug Class                     Cutoff (ng/mL) Amphetamine and metabolites    1000 Barbiturate and metabolites    200 Benzodiazepine                 443 Tricyclics and metabolites     300 Opiates and metabolites        300 Cocaine and metabolites        300 THC                            50 Performed at [facility], [place] [address], [place], [place] [address]    Cholesterol 09/18/2020 194  0 - 200 mg/dL Final   Triglycerides 09/18/2020 86  <150 mg/dL Final   HDL 09/18/2020 83  >40 mg/dL Final  Total CHOL/HDL Ratio 09/18/2020 2.3  RATIO Final   VLDL 09/18/2020 17  0 - 40 mg/dL Final   LDL Cholesterol 09/18/2020 94  0 - 99 mg/dL Final   Comment:        Total Cholesterol/HDL:CHD Risk Coronary Heart Disease Risk Table                     Men   Women  1/2 Average Risk   3.4   3.3  Average Risk       5.0   4.4  2 X Average Risk   9.6   7.1  3 X Average Risk  23.4   11.0        Use the calculated Patient Ratio above and the CHD Risk Table to determine the patient's CHD Risk.        ATP III CLASSIFICATION (LDL):  <100     mg/dL   Optimal  100-129  mg/dL   Near or Above                    Optimal  130-159  mg/dL   Borderline   160-189  mg/dL   High  >190     mg/dL   Very High Performed at [place] [address] [place], [place] [address]    TSH 09/18/2020 5.149 (A) 0.350 - 4.500 uIU/mL Final   Comment: Performed by a 3rd Generation assay with a functional sensitivity of <=0.01 uIU/mL. Performed at [facility], [place] [address] [place], [place] [address]    Hgb A1c MFr Bld 09/18/2020 5.2  4.8 - 5.6 % Final   Comment: (NOTE) Pre diabetes:          5.7%-6.4%  Diabetes:              >6.4%  Glycemic control for   <7.0% adults with diabetes    Mean Plasma Glucose 09/18/2020 102.54  mg/dL Final   Performed at [facility], [place] [address], [place], [place] [address]   Free T4 09/18/2020 0.69  0.61 - 1.12 ng/dL Final   Comment: (NOTE) Biotin ingestion may interfere with free T4 tests. If the results are inconsistent with the TSH level, previous test results, or the clinical presentation, then consider biotin interference. If needed, order repeat testing after stopping biotin. Performed at [facility], [place] [address] [place], [place] [address]    T3, Free 09/21/2020 2.2  2.0 - 4.4 pg/mL Final   Comment: (NOTE) Performed At: [facility] [place], [place] [id number] Rush Farmer MD MV:[id number]   Admission on 09/16/2020, Discharged on 09/17/2020  Component Date Value Ref Range Status   SARS Coronavirus 2 by RT PCR 09/16/2020 NEGATIVE  NEGATIVE Final   Comment: (NOTE) SARS-CoV-2 target nucleic acids are NOT DETECTED.  The SARS-CoV-2 RNA is generally detectable in upper respiratory specimens during the acute phase of infection. The lowest concentration of SARS-CoV-2 viral copies this assay can detect is 138 copies/mL. A negative result does not preclude SARS-Cov-2 infection and should not be used as the sole basis for treatment or other patient management decisions. A negative result may occur with  improper specimen  collection/handling, submission of specimen other than nasopharyngeal swab, presence of viral mutation(s) within the areas targeted by this assay, and inadequate number of viral copies(<138 copies/mL). A negative result must be combined with clinical observations, patient history, and epidemiological information. The expected result is Negative.  Fact Sheet for Patients:  EntrepreneurPulse.com.au  Fact Sheet for Healthcare Providers:  IncredibleEmployment.be  This test is no                          t yet approved or cleared by the [place] FDA and  has been authorized for detection and/or diagnosis of SARS-CoV-2 by FDA under an Emergency Use Authorization (EUA). This EUA will remain  in effect (meaning this test can be used) for the duration of the COVID-19 declaration under Section 564(b)(1) of the Act, 21 U.S.C.section 360bbb-3(b)(1), unless the authorization is terminated  or revoked sooner.       Influenza A by PCR 09/16/2020 NEGATIVE  NEGATIVE Final   Influenza B by PCR 09/16/2020 NEGATIVE  NEGATIVE Final   Comment: (NOTE) The Xpert Xpress SARS-CoV-2/FLU/RSV plus assay is intended as an aid in the diagnosis of influenza from Nasopharyngeal swab specimens and should not be used as a sole basis for treatment. Nasal washings and aspirates are unacceptable for Xpert Xpress SARS-CoV-2/FLU/RSV testing.  Fact Sheet for Patients: EntrepreneurPulse.com.au  Fact Sheet for Healthcare Providers: IncredibleEmployment.be  This test is not yet approved or cleared by the [place] FDA and has been authorized for detection and/or diagnosis of SARS-CoV-2 by FDA under an Emergency Use Authorization (EUA). This EUA will remain in effect (meaning this test can be used) for the duration of the COVID-19 declaration under Section 564(b)(1) of the Act, 21 U.S.C. section 360bbb-3(b)(1), unless the authorization is  terminated or revoked.  Performed at [facility], [place] [address] [place], [place] [address]    Sodium 09/16/2020 139  135 - 145 mmol/L Final   Potassium 09/16/2020 4.1  3.5 - 5.1 mmol/L Final   Chloride 09/16/2020 104  98 - 111 mmol/L Final   CO2 09/16/2020 24  22 - 32 mmol/L Final   Glucose, Bld 09/16/2020 60 (A) 70 - 99 mg/dL Final   Glucose reference range applies only to samples taken after fasting for at least 8 hours.   BUN 09/16/2020 13  6 - 20 mg/dL Final   Creatinine, Ser 09/16/2020 1.18  0.61 - 1.24 mg/dL Final   Calcium 09/16/2020 9.4  8.9 - 10.3 mg/dL Final   Total Protein 09/16/2020 7.1  6.5 - 8.1 g/dL Final   Albumin 09/16/2020 4.0  3.5 - 5.0 g/dL Final   AST 09/16/2020 23  15 - 41 U/L Final   ALT 09/16/2020 17  0 - 44 U/L Final   Alkaline Phosphatase 09/16/2020 91  38 - 126 U/L Final   Total Bilirubin 09/16/2020 0.4  0.3 - 1.2 mg/dL Final   GFR, Estimated 09/16/2020 >60  >60 mL/min Final   Comment: (NOTE) Calculated using the CKD-EPI Creatinine Equation (2021)    Anion gap 09/16/2020 11  5 - 15 Final   Performed at [place] [address] [place], [place] [address]   Alcohol, Ethyl (B) 09/16/2020 <10  <10 mg/dL Final   Comment: (NOTE) Lowest detectable limit for serum alcohol is 10 mg/dL.  For medical purposes only. Performed at [facility], [place] [address] [place], [place] [address]    WBC 09/16/2020 4.0  4.0 - 10.5 K/uL Final   RBC 09/16/2020 4.75  4.22 - 5.81 MIL/uL Final   Hemoglobin 09/16/2020 15.6  13.0 - 17.0 g/dL Final   HCT 09/16/2020 47.7  39.0 - 52.0 % Final   MCV 09/16/2020 100.4 (A) 80.0 - 100.0  fL Final   MCH 09/16/2020 32.8  26.0 - 34.0 pg Final   MCHC 09/16/2020 32.7  30.0 - 36.0 g/dL Final   RDW 09/16/2020 12.6  11.5 - 15.5 % Final   Platelets 09/16/2020 405 (A) 150 - 400 K/uL Final   nRBC 09/16/2020 0.0  0.0 - 0.2 % Final   Neutrophils Relative % 09/16/2020 18  % Final   Neutro Abs 09/16/2020 0.7 (A) 1.7 - 7.7  K/uL Final   Lymphocytes Relative 09/16/2020 59  % Final   Lymphs Abs 09/16/2020 2.4  0.7 - 4.0 K/uL Final   Monocytes Relative 09/16/2020 8  % Final   Monocytes Absolute 09/16/2020 0.3  0.1 - 1.0 K/uL Final   Eosinophils Relative 09/16/2020 12  % Final   Eosinophils Absolute 09/16/2020 0.5  0.0 - 0.5 K/uL Final   Basophils Relative 09/16/2020 3  % Final   Basophils Absolute 09/16/2020 0.1  0.0 - 0.1 K/uL Final   WBC Morphology 09/16/2020 See Note   Final   Smudge Cells   nRBC 09/16/2020 0  0 /100 WBC Final   Abs Immature Granulocytes 09/16/2020 0.00  0.00 - 0.07 K/uL Final   Performed at [facility], [place] [address] [place], [place] [address]   Path Review 09/16/2020 NEUTROPENIA   Final   Comment: Reviewed by Chrystie Nose. Saralyn Pilar, M.D. 09/17/2020 Performed at [facility], [place] [address], [place], [place] [address]    Glucose-Capillary 09/16/2020 107 (A) 70 - 99 mg/dL Final   Glucose reference range applies only to samples taken after fasting for at least 8 hours.  Admission on 08/20/2020, Discharged on 08/21/2020  Component Date Value Ref Range Status   SARS Coronavirus 2 by RT PCR 08/20/2020 POSITIVE (A) NEGATIVE Final   Comment: RESULT CALLED TO, READ BACK BY AND VERIFIED WITH: RN JASMINE J. [id number] FCP (NOTE) SARS-CoV-2 target nucleic acids are DETECTED.  The SARS-CoV-2 RNA is generally detectable in upper respiratory specimens during the acute phase of infection. Positive results are indicative of the presence of the identified virus, but do not rule out bacterial infection or co-infection with other pathogens not detected by the test. Clinical correlation with patient history and other diagnostic information is necessary to determine patient infection status. The expected result is Negative.  Fact Sheet for Patients: EntrepreneurPulse.com.au  Fact Sheet for Healthcare Providers: IncredibleEmployment.be  This test is not  yet approved or cleared by the [place] FDA and  has been authorized for detection and/or diagnosis of SARS-CoV-2 by FDA under an Emergency Use Authorization (EUA).  This EUA will remain in effect (meaning this test can be use                          d) for the duration of  the COVID-19 declaration under Section 564(b)(1) of the Act, 21 U.S.C. section 360bbb-3(b)(1), unless the authorization is terminated or revoked sooner.     Influenza A by PCR 08/20/2020 NEGATIVE  NEGATIVE Final   Influenza B by PCR 08/20/2020 NEGATIVE  NEGATIVE Final   Comment: (NOTE) The Xpert Xpress SARS-CoV-2/FLU/RSV plus assay is intended as an aid in the diagnosis of influenza from Nasopharyngeal swab specimens and should not be used as a sole basis for treatment. Nasal washings and aspirates are unacceptable for Xpert Xpress SARS-CoV-2/FLU/RSV testing.  Fact Sheet for Patients: EntrepreneurPulse.com.au  Fact Sheet for Healthcare Providers: IncredibleEmployment.be  This test is not yet approved or cleared by  the [employer] and has been authorized for detection and/or diagnosis of SARS-CoV-2 by FDA under an Emergency Use Authorization (EUA). This EUA will remain in effect (meaning this test can be used) for the duration of the COVID-19 declaration under Section 564(b)(1) of the Act, 21 U.S.C. section 360bbb-3(b)(1), unless the authorization is terminated or revoked.  Performed at [facility], [place] [address], [place],  [address]   Office Visit on 08/07/2020  Component Date Value Ref Range Status   Sed Rate 08/07/2020 6  0 - 15 mm/h Final   CRP 08/07/2020 4.3  <8.0 mg/L Final   HIV 1 RNA Quant 08/07/2020 Not Detected  Copies/mL Final   HIV-1 RNA Quant, Log 08/07/2020 Not Detected  Log cps/mL Final   Comment: . Reference Range:                           Not Detected     copies/mL                           Not Detected Log  copies/mL . Marland Kitchen The test was performed using Real-Time Polymerase Chain Reaction. . . Reportable Range: 20 copies/mL to 10,000,000 copies/mL (1.30 Log copies/mL to 7.00 Log copies/mL). .   Office Visit on 07/10/2020  Component Date Value Ref Range Status   Glucose, Bld 07/10/2020 66  65 - 99 mg/dL Final   Comment: .            Fasting reference interval .    BUN 07/10/2020 14  7 - 25 mg/dL Final   Creat 07/10/2020 0.83  0.60 - 1.35 mg/dL Final   GFR, Est Non African American 07/10/2020 103  > OR = 60 mL/min/1.5m Final   GFR, Est African American 07/10/2020 120  > OR = 60 mL/min/1.784mFinal   BUN/Creatinine Ratio 0425/63/8937OT APPLICABLE  6 - 22 (calc) Final   Sodium 07/10/2020 139  135 - 146 mmol/L Final   Potassium 07/10/2020 4.2  3.5 - 5.3 mmol/L Final   Chloride 07/10/2020 102  98 - 110 mmol/L Final   CO2 07/10/2020 24  20 - 32 mmol/L Final   Calcium 07/10/2020 9.5  8.6 - 10.3 mg/dL Final   Total Protein 07/10/2020 7.4  6.1 - 8.1 g/dL Final   Albumin 07/10/2020 4.8  3.6 - 5.1 g/dL Final   Globulin 07/10/2020 2.6  1.9 - 3.7 g/dL (calc) Final   AG Ratio 07/10/2020 1.8  1.0 - 2.5 (calc) Final   Total Bilirubin 07/10/2020 0.3  0.2 - 1.2 mg/dL Final   Alkaline phosphatase (APISO) 07/10/2020 76  36 - 130 U/L Final   AST 07/10/2020 29  10 - 40 U/L Final   ALT 07/10/2020 17  9 - 46 U/L Final   HIV 1 RNA Quant 07/10/2020 Not Detected  Copies/mL Final   HIV-1 RNA Quant, Log 07/10/2020 Not Detected  Log cps/mL Final   Comment: . Reference Range:                           Not Detected     copies/mL                           Not Detected Log copies/mL . . Marland Kitchenhe test was performed using Real-Time Polymerase Chain Reaction. . . Reportable Range: 20 copies/mL  to 10,000,000 copies/mL (1.30 Log copies/mL to 7.00 Log copies/mL). .    CD4 T Cell Abs 07/10/2020 1,031  400 - 1,790 /uL Final   CD4 % Helper T Cell 07/10/2020 46  33 - 65 % Final   Performed at [facility], [place] [address], [place], [place] [address]    Blood Alcohol level:  Lab Results  Component Value Date   ETH <10 11/19/2020   ETH <10 67/20/9470    Metabolic Disorder Labs: Lab Results  Component Value Date   HGBA1C 5.2 09/18/2020   MPG 102.54 09/18/2020   MPG 96.8 03/24/2019   Lab Results  Component Value Date   PROLACTIN 6.7 09/29/2012   Lab Results  Component Value Date   CHOL 194 09/18/2020   TRIG 86 09/18/2020   HDL 83 09/18/2020   CHOLHDL 2.3 09/18/2020   VLDL 17 09/18/2020   LDLCALC 94 09/18/2020   LDLCALC 60 04/10/2020    Therapeutic Lab Levels: No results found for: LITHIUM Lab Results  Component Value Date   VALPROATE 59 04/06/2015   VALPROATE 95 11/18/2014   No components found for:  CBMZ  Physical Findings   AIMS    Flowsheet Row Admission (Discharged) from 09/17/2020 in [place] [facility] Admission (Discharged) from 03/23/2019 in [place] [facility] Admission (Discharged) from 03/31/2015 in [place] [facility] Admission (Discharged) from 11/14/2014 in [place] Total Score 0 0 0 0      AUDIT    Flowsheet Row Admission (Discharged) from 09/17/2020 in [place] [facility] Counselor from 05/22/2020 in [facility] Admission (Discharged) from 03/23/2019 in [facility] [facility] Admission (Discharged) from 03/31/2015 in [place] [facility] Admission (Discharged) from 11/14/2014 in [place]  Alcohol Use Disorder Identification Test Final Score (AUDIT) 31 32 '28 12 12      ' GAD-7    Flowsheet Row Office Visit from 10/03/2020 in [facility] Office Visit from 08/01/2020 in [facility] Office Visit from 05/22/2020 in [facility]  Total GAD-7  Score '18 9 19      ' PHQ2-9    [place] Office Visit from 10/27/2020 in [facility] Office Visit from 10/03/2020 in [facility] Office Visit from 08/07/2020 in [facility] Office Visit from 08/01/2020 in [facility] Office Visit from 05/22/2020 in [facility]  PHQ-2 Total Score 0 4 0 2 5  PHQ-9 Total Score -- 18 -- 13 [place] ED from 11/19/2020 in [facility] ED from 10/19/2020 in [place] Office Visit from 10/03/2020 in [place] CATEGORY High Risk No Risk High Risk        Musculoskeletal  Strength & Muscle Tone: within normal limits Gait & Station: normal Patient leans: N/A  Psychiatric Specialty Exam  Presentation  General Appearance: Appropriate for Environment; Casual  Eye Contact:Good  Speech:Clear and Coherent; Normal Rate  Speech Volume:Normal  Handedness:Right   Mood and Affect  Mood:Anxious  Affect:Congruent   Thought Process  Thought Processes:Coherent  Descriptions of Associations:Intact  Orientation:Full (Time, Place and Person)  Thought Content:Logical  Diagnosis of Schizophrenia or Schizoaffective disorder in  past: No    Hallucinations:Hallucinations: None  Ideas of Reference:None  Suicidal Thoughts:Suicidal Thoughts: No  Homicidal Thoughts:Homicidal Thoughts: No   Sensorium  Memory:Immediate Good; Recent Good; Remote Good  Judgment:Fair  Insight:Fair   Executive Functions  Concentration:Good  Attention Span:Good  [place] of Knowledge:Good  Language:Good   Psychomotor Activity  Psychomotor Activity:Psychomotor Activity: Normal   Assets  Assets:Communication Skills; Desire for Improvement; Leisure Time; Physical Health;  Resilience   Sleep  Sleep:Sleep: Good Number of Hours of Sleep: 7   No data recorded  Physical Exam  Physical Exam Vitals and nursing note reviewed.  Constitutional:      Appearance: He is well-developed.  HENT:     Head: Normocephalic.  Eyes:     General:        Right eye: No discharge.        Left eye: No discharge.     Conjunctiva/sclera: Conjunctivae normal.  Cardiovascular:     Rate and Rhythm: Normal rate.  Pulmonary:     Effort: Pulmonary effort is normal. No respiratory distress.  Musculoskeletal:     Cervical back: Normal range of motion.  Skin:    Coloration: Skin is not jaundiced or pale.  Neurological:     Mental Status: He is alert and oriented to person, place, and time.  Psychiatric:        Attention and Perception: Attention and perception normal.        Mood and Affect: Mood is anxious.        Speech: Speech normal.        Behavior: Behavior normal. Behavior is cooperative.        Thought Content: Thought content normal.        Cognition and Memory: Cognition normal.        Judgment: Judgment is impulsive.   Review of Systems  Constitutional: Negative.  Negative for fever.  HENT: Negative.  Negative for hearing loss.   Eyes: Negative.   Respiratory: Negative.    Cardiovascular: Negative.  Negative for chest pain.  Musculoskeletal: Negative.   Skin: Negative.   Neurological: Negative.   Psychiatric/Behavioral:  The patient is nervous/anxious.   Blood pressure 118/85, pulse 87, temperature (!) 96.9 F (36.1 C), temperature source Tympanic, resp. rate 18, SpO2 100 %. There is no height or weight on file to calculate BMI.  Treatment Plan Summary: Daily contact with patient to assess and evaluate symptoms and progress in treatment and Medication management.   Patient continues to meet criteria for treatment in the [facility]. Continue to plan for discharge on 11/24/2020 to [facility]  No medications at this time we will continue to monitor.  Revonda Humphrey, NP 11/23/2020 12:18 PM

## 2020-11-23 NOTE — ED Notes (Signed)
Pt is in dining room, eating a peanut butter and jelly sandwich

## 2020-11-23 NOTE — ED Provider Notes (Signed)
Patient requests to speak to provider.  He expressed his concerns about wanting to make contact with his payee concerning his money.  States he also wants to make his medication management appointment with [facility] PA on 11/24/2020 at 9:30 AM.  States that this time he would like to decline his screening appointment with [facility].  Patient states he would like to be discharged in the a.m.  His caseworker is going to pick him up, provide him with breakfast.  Then accompany him to his medication management appointment.

## 2020-11-23 NOTE — ED Notes (Signed)
Pt came to nurses station and asked to speak with the physician about his pending discharge to [facility].  Pt stated "I don't think I want to go there anymore."  Asked pt if he had other alternatives pt stated no.  Provider was notified about his request.  Will continue to monitor for safety.

## 2020-11-24 ENCOUNTER — Ambulatory Visit (INDEPENDENT_AMBULATORY_CARE_PROVIDER_SITE_OTHER): Payer: Medicaid Other | Admitting: Physician Assistant

## 2020-11-24 ENCOUNTER — Encounter (HOSPITAL_COMMUNITY): Payer: Self-pay | Admitting: Physician Assistant

## 2020-11-24 VITALS — BP 117/71 | HR 90 | Ht 67.0 in | Wt 151.0 lb

## 2020-11-24 DIAGNOSIS — F4310 Post-traumatic stress disorder, unspecified: Secondary | ICD-10-CM

## 2020-11-24 DIAGNOSIS — F332 Major depressive disorder, recurrent severe without psychotic features: Secondary | ICD-10-CM | POA: Diagnosis not present

## 2020-11-24 DIAGNOSIS — F99 Mental disorder, not otherwise specified: Secondary | ICD-10-CM

## 2020-11-24 DIAGNOSIS — F3164 Bipolar disorder, current episode mixed, severe, with psychotic features: Secondary | ICD-10-CM | POA: Diagnosis not present

## 2020-11-24 DIAGNOSIS — Z20822 Contact with and (suspected) exposure to covid-19: Secondary | ICD-10-CM | POA: Diagnosis not present

## 2020-11-24 DIAGNOSIS — F411 Generalized anxiety disorder: Secondary | ICD-10-CM

## 2020-11-24 DIAGNOSIS — F419 Anxiety disorder, unspecified: Secondary | ICD-10-CM | POA: Diagnosis not present

## 2020-11-24 DIAGNOSIS — F5105 Insomnia due to other mental disorder: Secondary | ICD-10-CM

## 2020-11-24 DIAGNOSIS — F1420 Cocaine dependence, uncomplicated: Secondary | ICD-10-CM

## 2020-11-24 DIAGNOSIS — F1020 Alcohol dependence, uncomplicated: Secondary | ICD-10-CM

## 2020-11-24 DIAGNOSIS — F1220 Cannabis dependence, uncomplicated: Secondary | ICD-10-CM

## 2020-11-24 DIAGNOSIS — B20 Human immunodeficiency virus [HIV] disease: Secondary | ICD-10-CM | POA: Diagnosis not present

## 2020-11-24 MED ORDER — MIRTAZAPINE 15 MG PO TABS: 15.0000 mg | ORAL_TABLET | Freq: Every day | ORAL | 1 refills | Status: DC

## 2020-11-24 MED ORDER — QUETIAPINE FUMARATE 100 MG PO TABS: 100.0000 mg | ORAL_TABLET | Freq: Every day | ORAL | 1 refills | Status: DC

## 2020-11-24 MED ORDER — TRAZODONE HCL 100 MG PO TABS: 100.0000 mg | ORAL_TABLET | Freq: Every evening | ORAL | 1 refills | Status: DC | PRN

## 2020-11-24 MED ORDER — BUSPIRONE HCL 7.5 MG PO TABS: 7.5000 mg | ORAL_TABLET | Freq: Two times a day (BID) | ORAL | 1 refills | Status: DC

## 2020-11-24 NOTE — ED Provider Notes (Signed)
[facility] Discharge Suicide Risk Assessment   Principal Problem: MDD (major depressive disorder), recurrent episode ([place]) Discharge Diagnoses: Principal Problem:   MDD (major depressive disorder), recurrent episode ([place]) Active Problems:   Human immunodeficiency virus (HIV) disease ([place])   Suicidal ideation   Total Time spent with patient: 20 minutes  Subjective data from today's evaluation.  [name], 50 y.o., male patient seen face to face by this provider, Dr. Dwyane Dee; and  chart reviewed on 11/24/20.  On evaluation Malin Eickhoff reports he no longer wants to attend [facility].  States he would consider it in the future but at this time he is requesting to be discharged home.  States he needs to contact his payee concerning his money and he does not want to miss his medication management this morning at 9:30 AM with [facility].     During evaluation Luman Basset is in sitting position is alert/oriented x 4; calm/cooperative.  He makes good eye contact.  Speech is clear, coherent, normal rate and tone.  His mood is euthymic with congruent affect.  His thought process is coherent and relevant.  There is no indication that he is currently responding to internal/external stimuli or experiencing delusional thought content.  Patient denies suicidal/self-harm/homicidal ideation, psychosis, and paranoia.  Patient contracts for safety.  Denies access to firearms/weapons.  Denies auditory and visual hallucinations.  Denies any withdrawal symptoms.  Denies acceptance into [facility].  Outpatient substance use resources provided   Patient has remained calm throughout assessment and has answered questions appropriately.      Stay Summary: Keyston Soland was admitted [facility] unit for MDD (major depressive disorder), recurrent episode ([place]), cocaine use and crisis management.  His home medications were restarted which included Remeron, Seroquel, trazodone, BuSpar, and Symtuza, which were tolerated with no adverse  reactions.   Hiroto Kemna was discharged with current medication and was instructed on how to take medications as prescribed; Samples have been ordered and were given along with 30 script.    Varnell Ferre's improvement was monitored including his report of symptom reduction. His emotional and mental status was also monitored by staff.         Treston Horvat was evaluated for stability and plans for continued recovery upon discharge.  The following was addressed as part of his discharge planning and follow up treatment:  Employment, housing, transportation, bed availability, health status, family support, and any pending legal issues were also considered during his during the continuous assessment/observation. He was offered further treatment options upon discharge including but not limited to Residential, Intensive Outpatient, Outpatient treatment, Rehabilitation services, and resources for shelters and [facility] if needed. He declined [facility] admission. Caylin Bakeman will follow up with the services as listed below under Follow up Information.      Upon completion of this admission the Devaunte Zambrana was both mentally and medically stable for discharge denying suicidal/homicidal ideation, auditory/visual/tactile hallucinations, delusional thoughts and paranoia.      Musculoskeletal: Strength & Muscle Tone: within normal limits Gait & Station: normal Patient leans: N/A  Psychiatric Specialty Exam  Presentation  General Appearance: Appropriate for Environment; Casual  Eye Contact:Good  Speech:Clear and Coherent; Normal Rate  Speech Volume:Normal  Handedness:Right   Mood and Affect  Mood:Euthymic  Duration of Depression Symptoms: Greater than two weeks  Affect:Congruent   Thought Process  Thought Processes:Coherent  Descriptions of Associations:Intact  Orientation:Full (Time, Place and Person)  Thought Content:Logical  History of Schizophrenia/Schizoaffective  disorder:No  Duration of Psychotic Symptoms:No data recorded Hallucinations:Hallucinations: None  Ideas of Reference:None  Suicidal Thoughts:Suicidal Thoughts: No  Homicidal Thoughts:Homicidal Thoughts: No   Sensorium  Memory:Immediate Good; Recent Good; Remote Good  Judgment:Fair  Insight:Fair   Executive Functions  Concentration:Good  Attention Span:Good  [place] of Knowledge:Good  Language:Good   Psychomotor Activity  Psychomotor Activity:Psychomotor Activity: Normal   Assets  Assets:Communication Skills; Desire for Improvement; Leisure Time; Resilience   Sleep  Sleep:Sleep: Good Number of Hours of Sleep: 7   Physical Exam: Physical Exam see discharge summary ROS see discharge summary Blood pressure 110/76, pulse 93, temperature (!) 97.1 F (36.2 C), temperature source Oral, resp. rate 18, SpO2 100 %. There is no height or weight on file to calculate BMI.  Mental Status Per Nursing Assessment::   On Admission:   Dr. Christ Kick 11/19/2020 admissin H&P  Patient presented voluntarily to the [facility] due to  suicidal thoughts that he has been experiencing for the last 2.5- 3 months. Patient attributes these suicidal thoughts to multiple stressors . When asked for details about these particular stressors, he reports  "I am stressed" and references "things going on the world, my disability things, arguing". He states that he received his first disability check on the first of the month and that he had plans to split a room with a friend; however, these arrangements were not able to be madet and he states that he has been staying under a bridge. He describes worsening of his suicidal thoughts; he denies having  a plan but states that he would use "anything I can get my hands on"and reports multiple suicide attempts in the past. He reports vague HI directed at no one in particular with no plan or intent. He reports VH of "black spots" that follow him and  [facility]  instructing him to harm himself. He states that he has has not been taking his medications  for the past couple months because "of the way they make me feel" and describes feeling like a "zombie". He denies that he has noticed any changes to his mood since he stopped taking his medications about 2 months ago l; however, he then states that he would like to "get back on my medications" and indicates that he found them to be helpful. Marland KitchenHe  denies recent drug use- denies cocaine use (has documentation of cocaine use disorder) and states that he used marijuana last about 3 weeks ago.  On interview he does appear to be actively intoxicated with substances(s) at time of evaluation as he is pacing and restless although does not appear to be actively psychotic.      On chart review, he was recently hospitalized at [facility] from 7/6- 7/12. Dx with AUD, cocaine use disorder, cannabis use disorder, MDD, and polysubstance abuse. He was discharged with buspar 5 BID, remeron 15 mg qhs, seroquel 100 mg qhs., and trazodone 100 mg qhs prn. During that admission he also had reported to malingering psychotic sx in the past.     Demographic Factors:  Male and Low socioeconomic status  Loss Factors: Financial problems/change in socioeconomic status  Historical Factors: Impulsivity  Risk Reduction Factors:   Sense of responsibility to family, Living with another person, especially a relative, Positive social support, Positive therapeutic relationship, and Positive coping skills or problem solving skills  Continued Clinical Symptoms:  Severe Anxiety and/or Agitation Depression:   Comorbid alcohol abuse/dependence Impulsivity Alcohol/Substance Abuse/Dependencies  Cognitive Features That Contribute To Risk:  None    Suicide Risk:  Minimal: No identifiable suicidal ideation.  Patients presenting with no risk factors but with morbid ruminations; may be classified as minimal risk based on the severity of the depressive  symptoms   Follow-up [place]. Go to.   Specialty: Behavioral Health Why: To establish outpatient psychiatric services, please go during walk-in hours.   Medication Management Walk-in hours:  Monday-Friday from 8:00am-11:00am. Please be sure to arrive between 7:30am-7:45am, as patients are seen on a first come, first served basis.   Therapy Walk-in hours:  Monday-Wednesday from 8:00am-until slots are full. Please be sure to arrive between 7:30am-7:45am, as patients are seen on a first come, first served basis.   Fridays from 1:00pm-5:00pm. Contact information: [place], [facility]. Go on 11/24/2020.   Why: Your screening for admission appointment is Monday, 11/24/2020 at 9:00am. Please be sure to have two weeks worth of medication samples, including your 30 day prescription with a refill. Please provide any discharge paperwork from this encounter, including a list of medications. Contact information: [address] [address] [phone]                 Plan Of Care/Follow-up recommendations:  Activity:  as tolerated  Diet:  regular  Discharge patient.    Samples have been ordered and were given along with 30 script.    Follow up appt with [place] PA this am 9:30    Resources for substance abuse treatment provided.   No evidence of imminent risk to self or others at present.    Patient does not meet criteria for psychiatric inpatient admission. Discussed crisis plan, support from social network, calling 911, coming to the Emergency Department, and calling Suicide Hotline.     Revonda Humphrey, NP 11/24/2020, 8:35 AM

## 2020-11-24 NOTE — ED Provider Notes (Addendum)
FBC/OBS ASAP Discharge Summary  Date and Time: 11/24/2020 7:45 AM  Name: Moon Reichert  MRN:  [id number]   Discharge Diagnoses:  Final diagnoses:  Human immunodeficiency virus (HIV) disease ([place])  Severe episode of recurrent major depressive disorder, without psychotic features ([place])  Asymptomatic HIV infection, with no history of HIV-related illness ([place])  PTSD (post-traumatic stress disorder)  Bipolar disorder, curr episode mixed, severe, with psychotic features ([place])  Uncomplicated asthma, unspecified asthma severity, unspecified whether persistent    Subjective:  [name], 50 y.o., male patient seen face to face by this provider, Dr. Dwyane Dee; and  chart reviewed on 11/24/20.  On evaluation Stanislaus Bodkins reports he no longer wants to attend [facility].  States he would consider it in the future but at this time he is requesting to be discharged home.  States he needs to contact his payee concerning his money and he does not want to miss his medication management this morning at 9:30 AM with [facility].    During evaluation Jabrill Payant is in sitting position is alert/oriented x 4; calm/cooperative.  He makes good eye contact.  Speech is clear, coherent, normal rate and tone.  His mood is euthymic with congruent affect.  His thought process is coherent and relevant.  There is no indication that he is currently responding to internal/external stimuli or experiencing delusional thought content.  Patient denies suicidal/self-harm/homicidal ideation, psychosis, and paranoia.  Patient contracts for safety.  Denies access to firearms/weapons.  Denies auditory and visual hallucinations.  Denies any withdrawal symptoms.  Denies acceptance into [facility].  Outpatient substance use resources provided  Patient has remained calm throughout assessment and has answered questions appropriately.     Stay Summary: Adron Cury was admitted [facility] unit for MDD (major depressive disorder), recurrent episode  ([place]), cocaine use and crisis management.  His home medications were restarted which included Remeron, Seroquel, trazodone, BuSpar, and Symtuza, which were tolerated with no adverse reactions.   Yonas Hovde was discharged with current medication and was instructed on how to take medications as prescribed; Samples have been ordered and were given along with 30 script.   Zachry Robins's improvement was monitored including his report of symptom reduction. His emotional and mental status was also monitored by staff.         Najae Lauten was evaluated for stability and plans for continued recovery upon discharge.  The following was addressed as part of his discharge planning and follow up treatment:  Employment, housing, transportation, bed availability, health status, family support, and any pending legal issues were also considered during his during the continuous assessment/observation. He was offered further treatment options upon discharge including but not limited to Residential, Intensive Outpatient, Outpatient treatment, Rehabilitation services, and resources for shelters and [facility]-house if needed. He declined [facility] admission. Aviyon Rinella will follow up with the services as listed below under Follow up Information.     Upon completion of this admission the Dasmond Winrow was both mentally and medically stable for discharge denying suicidal/homicidal ideation, auditory/visual/tactile hallucinations, delusional thoughts and paranoia.     Total Time spent with patient: 20 minutes  Past Psychiatric History: MDD, plysubstance abuse, AUD, previous SI Past Medical History:  Past Medical History:  Diagnosis Date   Alcohol abuse    Alcoholism ([place])    Anxiety    Asthma    Asthma    Bipolar disorder ([place])    Chronic low back pain    Cocaine abuse ([place])    Depression  Gout    Gout    HIV (human immunodeficiency virus infection) ([place])    "dx'd ~ 2 yr ago" (09/29/2012)   HIV (human  immunodeficiency virus infection) ([place])    HIV disease ([place])    Homelessness    Hypertension    Marijuana abuse    Mental disorder    Schizophrenia ([place])     Past Surgical History:  Procedure Laterality Date   SKIN GRAFT FULL THICKNESS LEG Left ?   POSTERIOR LEFT LEG  AFTER DOG BITES   Family History:  Family History  Problem Relation Age of Onset   Alcoholism Mother    Depression Mother    Alcoholism Brother    Family Psychiatric History: unkown Social History:  Social History   Substance and Sexual Activity  Alcohol Use Yes   Alcohol/week: 12.0 standard drinks   Types: 12 Cans of beer per week   Comment: Daily. "As much as I can get."     Social History   Substance and Sexual Activity  Drug Use Yes   Types: Marijuana, "Crack" cocaine   Comment: States been drinking 4-40oz beers daily.    Social History   Socioeconomic History   Marital status: Single    Spouse name: Not on file   Number of children: 0   Years of education: Not on file   Highest education level: 9th grade  Occupational History   Occupation: unemployed  Tobacco Use   Smoking status: Every Day    Packs/day: 0.50    Years: 27.00    Pack years: 13.50    Types: Cigarettes   Smokeless tobacco: Never  Vaping Use   Vaping Use: Never used  Substance and Sexual Activity   Alcohol use: Yes    Alcohol/week: 12.0 standard drinks    Types: 12 Cans of beer per week    Comment: Daily. "As much as I can get."   Drug use: Yes    Types: Marijuana, "Crack" cocaine    Comment: States been drinking 4-40oz beers daily.   Sexual activity: Not Currently    Partners: Male    Birth control/protection: Condom    Comment: given condoms 04/10/20  Other Topics Concern   Not on file  Social History Narrative   ** Merged History Encounter **       ** Merged History Encounter **       Social Determinants of Health   Financial Resource Strain: High Risk   Difficulty of Paying Living Expenses: Very hard   Food Insecurity: Landscape architect Present   Worried About Charity fundraiser in the Last Year: Sometimes true   Arboriculturist in the Last Year: Sometimes true  Transportation Needs: Public librarian (Medical): Yes   Lack of Transportation (Non-Medical): Yes  Physical Activity: Inactive   Days of Exercise per Week: 0 days   Minutes of Exercise per Session: 0 min  Stress: Stress Concern Present   Feeling of Stress : Rather much  Social Connections: Moderately Isolated   Frequency of Communication with Friends and Family: Three times a week   Frequency of Social Gatherings with Friends and Family: Three times a week   Attends Religious Services: Never   Active Member of Clubs or Organizations: Yes   Attends Music therapist: More than 4 times per year   Marital Status: Never married   SDOH:  SDOH Screenings   Alcohol Screen: Medium Risk   Last Alcohol  Screening Score (AUDIT): 31  Depression (PHQ2-9): Low Risk    PHQ-2 Score: 0  Financial Resource Strain: High Risk   Difficulty of Paying Living Expenses: Very hard  Food Insecurity: Food Insecurity Present   Worried About Charity fundraiser in the Last Year: Sometimes true   Ran Out of Food in the Last Year: Sometimes true  Housing: Medium Risk   Last Housing Risk Score: 1  Physical Activity: Inactive   Days of Exercise per Week: 0 days   Minutes of Exercise per Session: 0 min  Social Connections: Moderately Isolated   Frequency of Communication with Friends and Family: Three times a week   Frequency of Social Gatherings with Friends and Family: Three times a week   Attends Religious Services: Never   Active Member of Clubs or Organizations: Yes   Attends Music therapist: More than 4 times per year   Marital Status: Never married  Stress: Stress Concern Present   Feeling of Stress : Rather much  Tobacco Use: High Risk   Smoking Tobacco Use: Every Day    Smokeless Tobacco Use: Never  Transportation Needs: Unmet Transportation Needs   Lack of Transportation (Medical): Yes   Lack of Transportation (Non-Medical): Yes    Tobacco Cessation:  N/A, patient does not currently use tobacco products  Current Medications:  Current Facility-Administered Medications  Medication Dose Route Frequency Provider Last Rate Last Admin   acetaminophen (TYLENOL) tablet 650 mg  650 mg Oral Q6H PRN Ival Bible, MD   650 mg at 11/22/20 1012   albuterol (VENTOLIN HFA) 108 (90 Base) MCG/ACT inhaler 2 puff  2 puff Inhalation Q4H PRN Ival Bible, MD   2 puff at 11/19/20 2053   alum & mag hydroxide-simeth (MAALOX/MYLANTA) 200-200-20 MG/5ML suspension 30 mL  30 mL Oral Q4H PRN Ival Bible, MD       Darunavir-Cobicistat-Emtricitabine-Tenofovir Alafenamide (SYMTUZA) 800-150-200-10 MG TABS 1 tablet  1 tablet Oral q morning Ival Bible, MD   1 tablet at 11/23/20 0917   hydrOXYzine (ATARAX/VISTARIL) tablet 25 mg  25 mg Oral TID PRN Ival Bible, MD       magnesium hydroxide (MILK OF MAGNESIA) suspension 30 mL  30 mL Oral Daily PRN Ival Bible, MD       mirtazapine (REMERON) tablet 15 mg  15 mg Oral QHS Ival Bible, MD   15 mg at 11/23/20 2152   QUEtiapine (SEROQUEL) tablet 100 mg  100 mg Oral QHS Ival Bible, MD   100 mg at 11/23/20 2152   traZODone (DESYREL) tablet 100 mg  100 mg Oral QHS PRN Ival Bible, MD       Current Outpatient Medications  Medication Sig Dispense Refill   albuterol (VENTOLIN HFA) 108 (90 Base) MCG/ACT inhaler Inhale 2 puffs into the lungs every 4 (four) hours as needed for wheezing or shortness of breath. 18 g 0   mirtazapine (REMERON) 15 MG tablet Take 1 tablet (15 mg total) by mouth at bedtime. 30 tablet 0   QUEtiapine (SEROQUEL) 100 MG tablet Take 1 tablet (100 mg total) by mouth at bedtime. 30 tablet 0   SYMTUZA 800-150-200-10 MG TABS Take 1 tablet by mouth every  morning. 30 tablet 0   traZODone (DESYREL) 100 MG tablet Take 1 tablet (100 mg total) by mouth at bedtime as needed for sleep. 30 tablet 0    PTA Medications: (Not in a hospital admission)   Musculoskeletal  Strength &  Muscle Tone: within normal limits Gait & Station: normal Patient leans: N/A  Psychiatric Specialty Exam  Presentation  General Appearance: Appropriate for Environment; Casual  Eye Contact:Good  Speech:Clear and Coherent; Normal Rate  Speech Volume:Normal  Handedness:Right   Mood and Affect  Mood:Euthymic  Affect:Congruent   Thought Process  Thought Processes:Coherent  Descriptions of Associations:Intact  Orientation:Full (Time, Place and Person)  Thought Content:Logical  Diagnosis of Schizophrenia or Schizoaffective disorder in past: No    Hallucinations:Hallucinations: None  Ideas of Reference:None  Suicidal Thoughts:Suicidal Thoughts: No  Homicidal Thoughts:Homicidal Thoughts: No   Sensorium  Memory:Immediate Good; Recent Good; Remote Good  Judgment:Fair  Insight:Fair   Executive Functions  Concentration:Good  Attention Span:Good  [place] of Knowledge:Good  Language:Good   Psychomotor Activity  Psychomotor Activity:Psychomotor Activity: Normal   Assets  Assets:Communication Skills; Desire for Improvement; Leisure Time; Resilience   Sleep  Sleep:Sleep: Good Number of Hours of Sleep: 7   No data recorded  Physical Exam  Physical Exam Vitals and nursing note reviewed.  Constitutional:      Appearance: He is well-developed.  HENT:     Head: Normocephalic and atraumatic.  Eyes:     General:        Right eye: No discharge.        Left eye: No discharge.     Conjunctiva/sclera: Conjunctivae normal.  Cardiovascular:     Rate and Rhythm: Normal rate.  Pulmonary:     Effort: Pulmonary effort is normal. No respiratory distress.  Musculoskeletal:        General: Normal range of motion.     Cervical  back: Normal range of motion.  Skin:    General: Skin is warm and dry.  Neurological:     Mental Status: He is alert and oriented to person, place, and time.  Psychiatric:        Attention and Perception: Attention and perception normal.        Mood and Affect: Mood and affect normal.        Speech: Speech normal.        Behavior: Behavior normal. Behavior is cooperative.        Thought Content: Thought content normal.        Cognition and Memory: Cognition normal.        Judgment: Judgment is impulsive.   Review of Systems  Constitutional: Negative.  Negative for chills and fever.  HENT: Negative.  Negative for hearing loss.   Eyes: Negative.   Respiratory: Negative.  Negative for cough.   Cardiovascular: Negative.  Negative for chest pain.  Musculoskeletal: Negative.   Skin: Negative.   Neurological: Negative.   Psychiatric/Behavioral: Negative.    Blood pressure 110/76, pulse 93, temperature (!) 97.1 F (36.2 C), temperature source Oral, resp. rate 18, SpO2 100 %. There is no height or weight on file to calculate BMI.  Demographic Factors:  Male and Low socioeconomic status  Loss Factors: Financial problems/change in socioeconomic status  Historical Factors: Prior suicide attempts and Impulsivity  Risk Reduction Factors:   Sense of responsibility to family, Living with another person, especially a relative, Positive social support, Positive therapeutic relationship, and Positive coping skills or problem solving skills  Continued Clinical Symptoms:  Severe Anxiety and/or Agitation Depression:   Comorbid alcohol abuse/dependence Impulsivity Alcohol/Substance Abuse/Dependencies  Cognitive Features That Contribute To Risk:  None    Suicide Risk:  Minimal: No identifiable suicidal ideation.  Patients presenting with no risk factors but with morbid ruminations; may  be classified as minimal risk based on the severity of the depressive symptoms  Plan Of Care/Follow-up  recommendations:  Activity:  as tolerated  Diet:  regular   Disposition:  Discharge patient.   14 day sample for medications were given along with 30 script. X 2 refills  Follow up appt with [facility] PA this am 9:30   Resources for substance abuse treatment provided.  No evidence of imminent risk to self or others at present.    Patient does not meet criteria for psychiatric inpatient admission. Discussed crisis plan, support from social network, calling 911, coming to the Emergency Department, and calling Suicide Hotline.   Revonda Humphrey, NP 11/24/2020, 7:45 AM

## 2020-11-24 NOTE — Discharge Instructions (Signed)

## 2020-11-24 NOTE — ED Notes (Signed)
Pt asleep in bed. Respirations even and unlabored. Will continue to monitor for safety. ?

## 2020-11-24 NOTE — Clinical Social Work Psych Note (Signed)
CSW Discharge Note  CSW met with patient to discuss his discharge plans. Emmanuell reports he no longer wants to discharge to [facility] for substance abuse treatment services.   Danyl shared that he "needs to get his stuff and money in order before I go to [facility]".   Khyre shared that he wants to discharge before his doctor's appointment at 9:30am with Trinna Post, NP.  Markham reports he plans to discharge to his aunt's home in [place], [place].    He reports his caseworker is meeting him for his appointment, and should be able to give him a ride.    Akito denied having any SI, HI, AVH at this time.   Ziaire denied having any other identifiable needs or concerns at this time.     Radonna Ricker, MSW, LCSW Clinical Education officer, museum ([place]) [facility]

## 2020-11-24 NOTE — Progress Notes (Signed)
[place] MD/PA/NP OP Progress Note  11/26/2020 9:57 PM John [name]  MRN:  [id number]  Chief Complaint:  Chief Complaint   Medication Management    HPI:   John [name] is a 50 year old male with a past psychiatric history significant for bipolar disorder, insomnia, PTSD, and generalized anxiety disorder who presents to [facility] for follow-up and medication management.  Patient is currently being managed on the following medications:  Mirtazapine 15 mg at bedtime Seroquel 100 mg at bedtime Trazodone 100 mg at bedtime Buspirone 7.5 mg 2 times daily  Patient reports that he was recently seen at [facility] prior to attending today's appointment.  Patient reports that he checked himself into [facility] due to stress and having suicidal thoughts.  Per chart review, patient presented voluntarily at [facility] due to suicidal thoughts caused by multiple stressors.  Patient was discharged today and was written prescriptions for his current medications.  Patient reports no issues or concerns regarding his current medication regimen.  Patient denies current depressive episodes and states that his anxiety is all right.  Patient rates his anxiety a 4 out of 10.  Patient reports that his current stressor relates to his disability checks.  He was recently approved for disability but states that he has been kept in the dark about the next steps.  Provider was able to reach out to patient's case manager John [name]) regarding the patient.  Patient's case manager states that the patient has been receiving disability checks but has only been receiving the checks in small increments due to patient's history of substance abuse.  Case manager states that patient's frustrations are high because the patient thought he would receive more money.  A PHQ-9 screen was performed with the patient scoring an 18.  A GAD-7 screen was also performed with the patient scoring a  16.  Patient is alert and oriented x4, calm, cooperative, and engaged in conversation during the encounter.  Patient endorses good mood and states that he has no plans today.  Patient denies suicidal or homicidal ideations.  He further denies auditory or visual hallucinations and does not appear to be responding to internal/external stimuli.  Patient endorses good sleep provided he takes his sleep aid medications.  Patient receives roughly 6 to 8 hours of sleep each night.  Patient endorses fair appetite and eats on average 2 meals per day.  Patient denies current alcohol use but states that he was consuming alcohol prior to his admission at [facility].  Patient denies current tobacco use.  Patient denies active illicit drug use but states that he was using drugs 2 days before admitting himself to [facility].  Visit Diagnosis:    ICD-10-CM   1. Generalized anxiety disorder  F41.1 busPIRone (BUSPAR) 7.5 MG tablet    mirtazapine (REMERON) 15 MG tablet    2. Insomnia due to other mental disorder  F51.05 QUEtiapine (SEROQUEL) 100 MG tablet   F99 traZODone (DESYREL) 100 MG tablet    mirtazapine (REMERON) 15 MG tablet    3. PTSD (post-traumatic stress disorder)  F43.10 mirtazapine (REMERON) 15 MG tablet    4. Bipolar disorder, curr episode mixed, severe, with psychotic features ([place])  F31.64 QUEtiapine (SEROQUEL) 100 MG tablet    mirtazapine (REMERON) 15 MG tablet    5. Alcohol use disorder, severe, dependence ([place])  F10.20     6. Cannabis use disorder, moderate, dependence ([facility])  F12.20     7. Cocaine use disorder, severe, dependence ([place])  F14.20  Past Psychiatric History:  Bipolar disorder Insomnia PTSD Generalized anxiety disorder Polysubstance abuse  Past Medical History:  Past Medical History:  Diagnosis Date   Alcohol abuse    Alcoholism ([place])    Anxiety    Asthma    Asthma    Bipolar disorder ([place])    Chronic low back pain    Cocaine abuse ([place])    Depression    Gout    Gout     HIV (human immunodeficiency virus infection) ([place])    "dx'd ~ 2 yr ago" (09/29/2012)   HIV (human immunodeficiency virus infection) ([place])    HIV disease ([place])    Homelessness    Hypertension    Marijuana abuse    Mental disorder    Schizophrenia ([place])     Past Surgical History:  Procedure Laterality Date   SKIN GRAFT FULL THICKNESS LEG Left ?   POSTERIOR LEFT LEG  AFTER DOG BITES    Family Psychiatric History:  Patient's mother has a history of mental health issues and substance abuse  Family History:  Family History  Problem Relation Age of Onset   Alcoholism Mother    Depression Mother    Alcoholism Brother     Social History:  Social History   Socioeconomic History   Marital status: Single    Spouse name: Not on file   Number of children: 0   Years of education: Not on file   Highest education level: 9th grade  Occupational History   Occupation: unemployed  Tobacco Use   Smoking status: Every Day    Packs/day: 0.50    Years: 27.00    Pack years: 13.50    Types: Cigarettes   Smokeless tobacco: Never  Vaping Use   Vaping Use: Never used  Substance and Sexual Activity   Alcohol use: Yes    Alcohol/week: 12.0 standard drinks    Types: 12 Cans of beer per week    Comment: Daily. "As much as I can get."   Drug use: Yes    Types: Marijuana, "Crack" cocaine    Comment: States been drinking 4-40oz beers daily.   Sexual activity: Not Currently    Partners: Male    Birth control/protection: Condom    Comment: given condoms 04/10/20  Other Topics Concern   Not on file  Social History Narrative   ** Merged History Encounter **       ** Merged History Encounter **       Social Determinants of Health   Financial Resource Strain: High Risk   Difficulty of Paying Living Expenses: Very hard  Food Insecurity: Landscape architect Present   Worried About Charity fundraiser in the Last Year: Sometimes true   Arboriculturist in the Last Year: Sometimes true   Transportation Needs: Public librarian (Medical): Yes   Lack of Transportation (Non-Medical): Yes  Physical Activity: Inactive   Days of Exercise per Week: 0 days   Minutes of Exercise per Session: 0 min  Stress: Stress Concern Present   Feeling of Stress : Rather much  Social Connections: Moderately Isolated   Frequency of Communication with Friends and Family: Three times a week   Frequency of Social Gatherings with Friends and Family: Three times a week   Attends Religious Services: Never   Active Member of Clubs or Organizations: Yes   Attends Archivist Meetings: More than 4 times per year   Marital Status: Never married  Allergies:  Allergies  Allergen Reactions   Shellfish Allergy Anaphylaxis and Swelling    Metabolic Disorder Labs: Lab Results  Component Value Date   HGBA1C 5.2 09/18/2020   MPG 102.54 09/18/2020   MPG 96.8 03/24/2019   Lab Results  Component Value Date   PROLACTIN 6.7 09/29/2012   Lab Results  Component Value Date   CHOL 194 09/18/2020   TRIG 86 09/18/2020   HDL 83 09/18/2020   CHOLHDL 2.3 09/18/2020   VLDL 17 09/18/2020   LDLCALC 94 09/18/2020   LDLCALC 60 04/10/2020   Lab Results  Component Value Date   TSH 5.149 (H) 09/18/2020   TSH 1.263 03/24/2019    Therapeutic Level Labs: No results found for: LITHIUM Lab Results  Component Value Date   VALPROATE 59 04/06/2015   VALPROATE 95 11/18/2014   No components found for:  CBMZ  Current Medications: Current Outpatient Medications  Medication Sig Dispense Refill   albuterol (VENTOLIN HFA) 108 (90 Base) MCG/ACT inhaler Inhale 2 puffs into the lungs every 4 (four) hours as needed for wheezing or shortness of breath. 18 g 0   busPIRone (BUSPAR) 7.5 MG tablet Take 1 tablet (7.5 mg total) by mouth 2 (two) times daily. 60 tablet 1   SYMTUZA 800-150-200-10 MG TABS Take 1 tablet by mouth every morning. 30 tablet 0   mirtazapine (REMERON) 15  MG tablet Take 1 tablet (15 mg total) by mouth at bedtime. 30 tablet 1   QUEtiapine (SEROQUEL) 100 MG tablet Take 1 tablet (100 mg total) by mouth at bedtime. 30 tablet 1   traZODone (DESYREL) 100 MG tablet Take 1 tablet (100 mg total) by mouth at bedtime as needed for sleep. 30 tablet 1   No current facility-administered medications for this visit.     Musculoskeletal: Strength & Muscle Tone: within normal limits Gait & Station: normal Patient leans: N/A  Psychiatric Specialty Exam: Review of Systems  Psychiatric/Behavioral:  Positive for sleep disturbance. Negative for decreased concentration, dysphoric mood, hallucinations, self-injury and suicidal ideas. The patient is nervous/anxious. The patient is not hyperactive.    Blood pressure 117/71, pulse 90, height '5\' 7"'$  (1.702 m), weight 151 lb (68.5 kg).Body mass index is 23.65 kg/m.  General Appearance: Fairly Groomed  Eye Contact:  Fair  Speech:  Clear and Coherent and Normal Rate  Volume:  Normal  Mood:  Anxious and Depressed  Affect:  Congruent and Depressed  Thought Process:  Coherent, Goal Directed, and Descriptions of Associations: Intact  Orientation:  Full (Time, Place, and Person)  Thought Content: WDL   Suicidal Thoughts:  No  Homicidal Thoughts:  No  Memory:  Immediate;   Good Recent;   Fair Remote;   Fair  Judgement:  Good  Insight:  Fair  Psychomotor Activity:  Restlessness  Concentration:  Concentration: Good and Attention Span: Good  Recall:  [employer] of Knowledge: Fair  Language: Good  Akathisia:  NA  Handed:  Right  AIMS (if indicated): not done  Assets:  Communication Skills Desire for Improvement Social Support Vocational/Educational  ADL's:  Intact  Cognition: WNL  Sleep:  Fair   Screenings: AIMS    Flowsheet Row Admission (Discharged) from 09/17/2020 in [facility] Admission (Discharged) from 03/23/2019 in [facility] Admission  (Discharged) from 03/31/2015 in [place] [facility] Admission (Discharged) from 11/14/2014 in [place] Total Score 0 0 0 0  AUDIT    Flowsheet Row Admission (Discharged) from 09/17/2020 in [facility] [facility] [facility] Counselor from 05/22/2020 in [facility] Admission (Discharged) from 03/23/2019 in [place] [facility] Admission (Discharged) from 03/31/2015 in [place] [facility] Admission (Discharged) from 11/14/2014 in [place]  Alcohol Use Disorder Identification Test Final Score (AUDIT) 31 32 '28 12 12      '$ GAD-7    Flowsheet Row Office Visit from 11/24/2020 in [facility] Office Visit from 10/03/2020 in [facility] Office Visit from 08/01/2020 in [facility] Office Visit from 05/22/2020 in [facility]  Total GAD-7 Score '16 18 9 19      '$ PHQ2-9    [place] Office Visit from 11/24/2020 in [facility] Office Visit from 10/27/2020 in [facility] Office Visit from 10/03/2020 in [facility] Office Visit from 08/07/2020 in [facility] Office Visit from 08/01/2020 in [facility]  PHQ-2 Total Score 5 0 4 0 2  PHQ-9 Total Score 18 -- 18 -- [place] Office Visit from 11/24/2020 in [facility] ED from 11/19/2020 in [facility] ED from 10/19/2020 in [place] High Risk High Risk No Risk        Assessment and Plan:   John [name] is a 51 year old male with a past psychiatric history significant for bipolar disorder,  insomnia, PTSD, and generalized anxiety disorder who presents to [facility] for follow-up and medication management. Patient was recent assessed and admitted to [facility]. Since being discharged, patient reports no issues or concerns regarding his mental health. Patient reports no issues or concerns regarding his current medication regimen.  Patient denies the need for dosage adjustments at this time.  Patient's medications to be e-prescribed to pharmacy of choice.  1. Generalized anxiety disorder  - busPIRone (BUSPAR) 7.5 MG tablet; Take 1 tablet (7.5 mg total) by mouth 2 (two) times daily.  Dispense: 60 tablet; Refill: 1 - mirtazapine (REMERON) 15 MG tablet; Take 1 tablet (15 mg total) by mouth at bedtime.  Dispense: 30 tablet; Refill: 1  2. Insomnia due to other mental disorder  - QUEtiapine (SEROQUEL) 100 MG tablet; Take 1 tablet (100 mg total) by mouth at bedtime.  Dispense: 30 tablet; Refill: 1 - traZODone (DESYREL) 100 MG tablet; Take 1 tablet (100 mg total) by mouth at bedtime as needed for sleep.  Dispense: 30 tablet; Refill: 1 - mirtazapine (REMERON) 15 MG tablet; Take 1 tablet (15 mg total) by mouth at bedtime.  Dispense: 30 tablet; Refill: 1  3. PTSD (post-traumatic stress disorder)  - mirtazapine (REMERON) 15 MG tablet; Take 1 tablet (15 mg total) by mouth at bedtime.  Dispense: 30 tablet; Refill: 1  4. Bipolar disorder, curr episode mixed, severe, with psychotic features ([place])  - QUEtiapine (SEROQUEL) 100 MG tablet; Take 1 tablet (100 mg total) by mouth at bedtime.  Dispense: 30 tablet; Refill: 1 - mirtazapine (REMERON) 15 MG tablet; Take 1 tablet (15 mg total) by mouth at bedtime.  Dispense: 30 tablet; Refill: 1  5. Alcohol use disorder, severe, dependence ([place])   6. Cannabis use disorder, moderate, dependence ([place])   7. Cocaine use disorder,  severe, dependence ([place])  Patient to follow up in 2 months Provider spent a total of 21  minutes with the patient/reviewing patient's chart  Malachy Mood, PA 11/26/2020, 9:57 PM

## 2020-11-24 NOTE — ED Notes (Signed)
Pt cooperative with staff throughout discharge process. Verbalized understanding of discharge instructions explained by staff. Pt received sample medications prior to discharge. Denies SI/HI/AVH prior to discharge. Safety maintained throughout hospital stay.

## 2020-11-24 NOTE — ED Notes (Signed)
Pt given breakfast.

## 2020-11-24 NOTE — ED Notes (Signed)
Pt sitting up in dining room in no acute distress. Safety maintained.

## 2020-11-26 ENCOUNTER — Encounter (HOSPITAL_COMMUNITY): Payer: Self-pay | Admitting: Physician Assistant

## 2020-11-26 ENCOUNTER — Other Ambulatory Visit (HOSPITAL_COMMUNITY): Payer: Self-pay | Admitting: Physician Assistant

## 2020-11-26 DIAGNOSIS — F3164 Bipolar disorder, current episode mixed, severe, with psychotic features: Secondary | ICD-10-CM

## 2020-11-26 DIAGNOSIS — F5105 Insomnia due to other mental disorder: Secondary | ICD-10-CM

## 2020-11-26 DIAGNOSIS — F4310 Post-traumatic stress disorder, unspecified: Secondary | ICD-10-CM

## 2020-11-26 DIAGNOSIS — F99 Mental disorder, not otherwise specified: Secondary | ICD-10-CM

## 2020-11-26 DIAGNOSIS — F411 Generalized anxiety disorder: Secondary | ICD-10-CM

## 2020-12-22 ENCOUNTER — Encounter (HOSPITAL_COMMUNITY): Payer: Self-pay | Admitting: Emergency Medicine

## 2020-12-22 ENCOUNTER — Emergency Department (HOSPITAL_COMMUNITY)
Admission: EM | Admit: 2020-12-22 | Discharge: 2020-12-22 | Disposition: A | Discharge status: Other Acute Inpatient Hospital | Payer: Medicaid Other | Attending: Emergency Medicine | Admitting: Emergency Medicine

## 2020-12-22 ENCOUNTER — Other Ambulatory Visit: Payer: Self-pay

## 2020-12-22 DIAGNOSIS — F1290 Cannabis use, unspecified, uncomplicated: Secondary | ICD-10-CM | POA: Insufficient documentation

## 2020-12-22 DIAGNOSIS — I10 Essential (primary) hypertension: Secondary | ICD-10-CM | POA: Diagnosis not present

## 2020-12-22 DIAGNOSIS — F5105 Insomnia due to other mental disorder: Secondary | ICD-10-CM | POA: Insufficient documentation

## 2020-12-22 DIAGNOSIS — R45851 Suicidal ideations: Secondary | ICD-10-CM | POA: Insufficient documentation

## 2020-12-22 DIAGNOSIS — Z560 Unemployment, unspecified: Secondary | ICD-10-CM | POA: Insufficient documentation

## 2020-12-22 DIAGNOSIS — F3164 Bipolar disorder, current episode mixed, severe, with psychotic features: Secondary | ICD-10-CM | POA: Insufficient documentation

## 2020-12-22 DIAGNOSIS — J45909 Unspecified asthma, uncomplicated: Secondary | ICD-10-CM | POA: Insufficient documentation

## 2020-12-22 DIAGNOSIS — Z21 Asymptomatic human immunodeficiency virus [HIV] infection status: Secondary | ICD-10-CM | POA: Insufficient documentation

## 2020-12-22 DIAGNOSIS — Z79899 Other long term (current) drug therapy: Secondary | ICD-10-CM | POA: Insufficient documentation

## 2020-12-22 DIAGNOSIS — F4310 Post-traumatic stress disorder, unspecified: Secondary | ICD-10-CM | POA: Insufficient documentation

## 2020-12-22 DIAGNOSIS — F17210 Nicotine dependence, cigarettes, uncomplicated: Secondary | ICD-10-CM | POA: Insufficient documentation

## 2020-12-22 DIAGNOSIS — Z5982 Transportation insecurity: Secondary | ICD-10-CM | POA: Insufficient documentation

## 2020-12-22 DIAGNOSIS — F1410 Cocaine abuse, uncomplicated: Secondary | ICD-10-CM | POA: Diagnosis not present

## 2020-12-22 DIAGNOSIS — F1210 Cannabis abuse, uncomplicated: Secondary | ICD-10-CM | POA: Insufficient documentation

## 2020-12-22 DIAGNOSIS — F411 Generalized anxiety disorder: Secondary | ICD-10-CM | POA: Diagnosis not present

## 2020-12-22 DIAGNOSIS — F333 Major depressive disorder, recurrent, severe with psychotic symptoms: Secondary | ICD-10-CM | POA: Insufficient documentation

## 2020-12-22 DIAGNOSIS — Z818 Family history of other mental and behavioral disorders: Secondary | ICD-10-CM | POA: Insufficient documentation

## 2020-12-22 DIAGNOSIS — F1010 Alcohol abuse, uncomplicated: Secondary | ICD-10-CM | POA: Insufficient documentation

## 2020-12-22 DIAGNOSIS — Z20822 Contact with and (suspected) exposure to covid-19: Secondary | ICD-10-CM | POA: Diagnosis not present

## 2020-12-22 DIAGNOSIS — Y900 Blood alcohol level of less than 20 mg/100 ml: Secondary | ICD-10-CM | POA: Insufficient documentation

## 2020-12-22 LAB — RESP PANEL BY RT-PCR (FLU A&B, COVID) ARPGX2
Influenza A by PCR: NEGATIVE
Influenza B by PCR: NEGATIVE
SARS Coronavirus 2 by RT PCR: NEGATIVE

## 2020-12-22 LAB — COMPREHENSIVE METABOLIC PANEL
ALT: 13 U/L (ref 0–44)
AST: 18 U/L (ref 15–41)
Albumin: 4.4 g/dL (ref 3.5–5.0)
Alkaline Phosphatase: 68 U/L (ref 38–126)
Anion gap: 11 (ref 5–15)
BUN: 16 mg/dL (ref 6–20)
CO2: 24 mmol/L (ref 22–32)
Calcium: 9.5 mg/dL (ref 8.9–10.3)
Chloride: 106 mmol/L (ref 98–111)
Creatinine, Ser: 1.02 mg/dL (ref 0.61–1.24)
GFR, Estimated: >60 mL/min (ref >60)
Glucose, Bld: 104 mg/dL — ABNORMAL HIGH (ref 70–99)
Potassium: 3.9 mmol/L (ref 3.5–5.1)
Sodium: 141 mmol/L (ref 135–145)
Total Bilirubin: 0.9 mg/dL (ref 0.3–1.2)
Total Protein: 7.8 g/dL (ref 6.5–8.1)

## 2020-12-22 LAB — CBC
HCT: 46 % (ref 39.0–52.0)
Hemoglobin: 14.9 g/dL (ref 13.0–17.0)
MCH: 32.4 pg (ref 26.0–34.0)
MCHC: 32.4 g/dL (ref 30.0–36.0)
MCV: 100 fL (ref 80.0–100.0)
Platelets: 383 10*3/uL (ref 150–400)
RBC: 4.6 MIL/uL (ref 4.22–5.81)
RDW: 13 % (ref 11.5–15.5)
WBC: 5.1 10*3/uL (ref 4.0–10.5)
nRBC: 0 % (ref 0.0–0.2)

## 2020-12-22 LAB — RAPID URINE DRUG SCREEN, HOSP PERFORMED
Amphetamines: NOT DETECTED
Barbiturates: NOT DETECTED
Benzodiazepines: NOT DETECTED
Cocaine: POSITIVE — AB
Opiates: NOT DETECTED
Tetrahydrocannabinol: POSITIVE — AB

## 2020-12-22 LAB — SALICYLATE LEVEL: Salicylate Lvl: <7 mg/dL — ABNORMAL LOW (ref 7.0–30.0)

## 2020-12-22 LAB — ACETAMINOPHEN LEVEL: Acetaminophen (Tylenol), Serum: <10 ug/mL — ABNORMAL LOW (ref 10–30)

## 2020-12-22 LAB — ETHANOL: Alcohol, Ethyl (B): <10 mg/dL (ref <10)

## 2020-12-22 MED ORDER — MIRTAZAPINE 7.5 MG PO TABS
15.0000 mg | ORAL_TABLET | Freq: Every day | ORAL | Status: DC
Start: 2020-12-22 — End: 2020-12-22
  Administered 2020-12-22: 15 mg via ORAL
  Filled 2020-12-22: qty 2

## 2020-12-22 MED ORDER — QUETIAPINE FUMARATE 100 MG PO TABS
100.0000 mg | ORAL_TABLET | Freq: Every day | ORAL | Status: DC
  Administered 2020-12-22: 100 mg via ORAL
  Filled 2020-12-22: qty 1

## 2020-12-22 MED ORDER — BUSPIRONE HCL 5 MG PO TABS
7.5000 mg | ORAL_TABLET | Freq: Two times a day (BID) | ORAL | Status: DC
  Administered 2020-12-22 (×2): 7.5 mg via ORAL
  Filled 2020-12-22 (×3): qty 1.5

## 2020-12-22 MED ORDER — ALBUTEROL SULFATE HFA 108 (90 BASE) MCG/ACT IN AERS: 2.0000 | INHALATION_SPRAY | RESPIRATORY_TRACT | Status: DC | PRN

## 2020-12-22 MED ORDER — LORAZEPAM 1 MG PO TABS: 0.0000 mg | ORAL_TABLET | Freq: Two times a day (BID) | ORAL | Status: DC

## 2020-12-22 MED ORDER — LORAZEPAM 2 MG/ML IJ SOLN: 0.0000 mg | Freq: Two times a day (BID) | INTRAMUSCULAR | Status: DC

## 2020-12-22 MED ORDER — THIAMINE HCL 100 MG PO TABS
100.0000 mg | ORAL_TABLET | Freq: Every day | ORAL | Status: DC
  Administered 2020-12-22: 100 mg via ORAL
  Filled 2020-12-22: qty 1

## 2020-12-22 MED ORDER — ALBUTEROL SULFATE (2.5 MG/3ML) 0.083% IN NEBU: 2.5000 mg | INHALATION_SOLUTION | RESPIRATORY_TRACT | Status: DC | PRN

## 2020-12-22 MED ORDER — LORAZEPAM 1 MG PO TABS
0.0000 mg | ORAL_TABLET | Freq: Four times a day (QID) | ORAL | Status: DC
  Administered 2020-12-22: 1 mg via ORAL
  Filled 2020-12-22: qty 1

## 2020-12-22 MED ORDER — TRAZODONE HCL 100 MG PO TABS: 100.0000 mg | ORAL_TABLET | Freq: Every evening | ORAL | Status: DC | PRN

## 2020-12-22 MED ORDER — DARUN-COBIC-EMTRICIT-TENOFAF 800-150-200-10 MG PO TABS
1.0000 | ORAL_TABLET | Freq: Every morning | ORAL | Status: DC
Start: 2020-12-22 — End: 2020-12-22
  Administered 2020-12-22: 1 via ORAL
  Filled 2020-12-22: qty 1

## 2020-12-22 MED ORDER — LORAZEPAM 2 MG/ML IJ SOLN: 0.0000 mg | Freq: Four times a day (QID) | INTRAMUSCULAR | Status: DC

## 2020-12-22 MED ORDER — THIAMINE HCL 100 MG/ML IJ SOLN: 100.0000 mg | Freq: Every day | INTRAMUSCULAR | Status: DC

## 2020-12-22 NOTE — ED Notes (Signed)
Call to [facility] at [phone] to request pt transfer to [facility] on [address].

## 2020-12-22 NOTE — ED Notes (Signed)
Unable to obtain signature at time of discharge as no signature pad is available in hall bed. Pt discharged with [facility] to transfer to [facility]. Ambulatory to exit with no difficulty.
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
## 2020-12-22 NOTE — ED Notes (Signed)
Belongings sent with Safe Transport at time of transfer to [facility].

## 2020-12-22 NOTE — ED Notes (Signed)
Called report to [facility] at [phone] and they are ready to accept pt when he arrives.

## 2020-12-22 NOTE — ED Notes (Signed)
Patient belongings in triage nursing station. Patient has changed into appropriate scrubs.
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
## 2020-12-22 NOTE — BH Assessment (Signed)
This clinician was informed by PA Margorie John that patient had been accepted to [facility] for continuous assessment.  Clinician informed RN Newman Pies via secure messaging and phone.

## 2020-12-23 ENCOUNTER — Ambulatory Visit (HOSPITAL_COMMUNITY)
Admission: EM | Admit: 2020-12-23 | Discharge: 2020-12-23 | Disposition: A | Discharge status: Home / Self Care | Payer: Medicaid Other | Source: Home / Self Care

## 2020-12-23 DIAGNOSIS — F3164 Bipolar disorder, current episode mixed, severe, with psychotic features: Secondary | ICD-10-CM

## 2020-12-23 DIAGNOSIS — F1010 Alcohol abuse, uncomplicated: Secondary | ICD-10-CM

## 2020-12-23 DIAGNOSIS — F1290 Cannabis use, unspecified, uncomplicated: Secondary | ICD-10-CM

## 2020-12-23 DIAGNOSIS — F411 Generalized anxiety disorder: Secondary | ICD-10-CM

## 2020-12-23 DIAGNOSIS — F5105 Insomnia due to other mental disorder: Secondary | ICD-10-CM

## 2020-12-23 DIAGNOSIS — F4310 Post-traumatic stress disorder, unspecified: Secondary | ICD-10-CM

## 2020-12-23 DIAGNOSIS — F99 Mental disorder, not otherwise specified: Secondary | ICD-10-CM

## 2020-12-23 DIAGNOSIS — F1410 Cocaine abuse, uncomplicated: Secondary | ICD-10-CM

## 2020-12-23 LAB — HEMOGLOBIN A1C
Hgb A1c MFr Bld: 5.2 % (ref 4.8–5.6)
Mean Plasma Glucose: 102.54 mg/dL

## 2020-12-23 MED ORDER — QUETIAPINE FUMARATE 100 MG PO TABS: 100.0000 mg | ORAL_TABLET | Freq: Every day | ORAL | Status: DC

## 2020-12-23 MED ORDER — ALBUTEROL SULFATE HFA 108 (90 BASE) MCG/ACT IN AERS: 2.0000 | INHALATION_SPRAY | RESPIRATORY_TRACT | Status: DC | PRN

## 2020-12-23 MED ORDER — ACETAMINOPHEN 325 MG PO TABS
650.0000 mg | ORAL_TABLET | Freq: Four times a day (QID) | ORAL | Status: DC | PRN
Start: 2020-12-23 — End: 2020-12-23
  Administered 2020-12-23: 650 mg via ORAL
  Filled 2020-12-23: qty 2

## 2020-12-23 MED ORDER — DARUN-COBIC-EMTRICIT-TENOFAF 800-150-200-10 MG PO TABS
1.0000 | ORAL_TABLET | Freq: Every morning | ORAL | Status: DC
  Filled 2020-12-23 (×2): qty 1

## 2020-12-23 MED ORDER — HYDROXYZINE HCL 25 MG PO TABS
25.0000 mg | ORAL_TABLET | Freq: Three times a day (TID) | ORAL | Status: DC | PRN
  Administered 2020-12-23: 25 mg via ORAL
  Filled 2020-12-23: qty 1

## 2020-12-23 MED ORDER — ALUM & MAG HYDROXIDE-SIMETH 200-200-20 MG/5ML PO SUSP: 30.0000 mL | ORAL | Status: DC | PRN

## 2020-12-23 MED ORDER — TRAZODONE HCL 100 MG PO TABS: 100.0000 mg | ORAL_TABLET | Freq: Every evening | ORAL | Status: DC | PRN

## 2020-12-23 MED ORDER — BUSPIRONE HCL 15 MG PO TABS
7.5000 mg | ORAL_TABLET | Freq: Two times a day (BID) | ORAL | Status: DC
  Administered 2020-12-23: 7.5 mg via ORAL
  Filled 2020-12-23: qty 1

## 2020-12-23 MED ORDER — MAGNESIUM HYDROXIDE 400 MG/5ML PO SUSP: 30.0000 mL | Freq: Every day | ORAL | Status: DC | PRN

## 2020-12-23 MED ORDER — MIRTAZAPINE 15 MG PO TABS: 15.0000 mg | ORAL_TABLET | Freq: Every day | ORAL | Status: DC

## 2020-12-23 NOTE — ED Provider Notes (Signed)
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 Section 564(b)(1) of the Act, 21 U.S.C. section 360bbb-3(b)(1), unless the authorization is terminated or revoked.  Performed at [facility], [place] [address], [place], [place] [address]    WBC 11/19/2020 5.0  4.0 - 10.5 K/uL Final    RBC 11/19/2020 4.15 (A) 4.22 - 5.81 MIL/uL Final   Hemoglobin 11/19/2020 13.4  13.0 - 17.0 g/dL Final   HCT 11/19/2020 39.9  39.0 - 52.0 % Final   MCV 11/19/2020 96.1  80.0 - 100.0 fL Final   MCH 11/19/2020 32.3  26.0 - 34.0 pg Final   MCHC 11/19/2020 33.6  30.0 - 36.0 g/dL Final   RDW 11/19/2020 12.6  11.5 - 15.5 % Final   Platelets 11/19/2020 360  150 - 400 K/uL Final   nRBC 11/19/2020 0.0  0.0 - 0.2 % Final   Neutrophils Relative % 11/19/2020 42  % Final   Neutro Abs 11/19/2020 2.1  1.7 - 7.7 K/uL Final   Lymphocytes Relative 11/19/2020 39  % Final   Lymphs Abs 11/19/2020 1.9  0.7 - 4.0 K/uL Final   Monocytes Relative 11/19/2020 13  % Final   Monocytes Absolute 11/19/2020 0.7  0.1 - 1.0 K/uL Final   Eosinophils Relative 11/19/2020 5  % Final   Eosinophils Absolute 11/19/2020 0.2  0.0 - 0.5 K/uL Final   Basophils Relative 11/19/2020 1  % Final   Basophils Absolute 11/19/2020 0.0  0.0 - 0.1 K/uL Final   Immature Granulocytes 11/19/2020 0  % Final   Abs Immature Granulocytes 11/19/2020 0.02  0.00 - 0.07 K/uL Final   Performed at [facility], [place] [address] [place], [place] [address]   Sodium 11/19/2020 137  135 - 145 mmol/L Final   Potassium 11/19/2020 4.1  3.5 - 5.1 mmol/L Final   Chloride 11/19/2020 103  98 - 111 mmol/L Final   CO2 11/19/2020 24  22 - 32 mmol/L Final   Glucose, Bld 11/19/2020 81  70 - 99 mg/dL Final   Glucose reference range applies only to samples taken after fasting for at least 8 hours.   BUN 11/19/2020 9  6 - 20 mg/dL Final   Creatinine, Ser 11/19/2020 0.94  0.61 - 1.24 mg/dL Final   Calcium 11/19/2020 9.3  8.9 - 10.3 mg/dL Final   Total Protein 11/19/2020 7.0  6.5 -  8.1 g/dL Final   Albumin 11/19/2020 4.0  3.5 - 5.0 g/dL Final   AST 11/19/2020 24  15 - 41 U/L Final   ALT 11/19/2020 17  0 - 44 U/L Final   Alkaline Phosphatase 11/19/2020 74  38 - 126 U/L Final   Total Bilirubin 11/19/2020 0.6  0.3 - 1.2 mg/dL Final   GFR, Estimated 11/19/2020 >60   >60 mL/min Final   Comment: (NOTE) Calculated using the CKD-EPI Creatinine Equation (2021)    Anion gap 11/19/2020 10  5 - 15 Final   Performed at [facility], [name] [address] [place], [place] [address]   Alcohol, Ethyl (B) 11/19/2020 <10  <10 mg/dL Final   Comment: (NOTE) Lowest detectable limit for serum alcohol is 10 mg/dL.  For medical purposes only. Performed at [facility], [place] [address], [place], [place] [address]    POC Amphetamine UR 11/20/2020 None Detected  NONE DETECTED (Cut Off Level 1000 ng/mL) Final   POC Secobarbital (BAR) 11/20/2020 None Detected  NONE DETECTED (Cut Off Level 300 ng/mL) Final   POC Buprenorphine (BUP) 11/20/2020 None Detected  NONE DETECTED (Cut Off Level 10 ng/mL) Final   POC Oxazepam (BZO) 11/20/2020 None Detected  NONE DETECTED (Cut Off Level 300 ng/mL) Final   POC Cocaine UR 11/20/2020 Positive (A) NONE DETECTED (Cut Off Level 300 ng/mL) Final   POC Methamphetamine UR 11/20/2020 None Detected  NONE DETECTED (Cut Off Level 1000 ng/mL) Final   POC Morphine 11/20/2020 None Detected  NONE DETECTED (Cut Off Level 300 ng/mL) Final   POC Oxycodone UR 11/20/2020 None Detected  NONE DETECTED (Cut Off Level 100 ng/mL) Final   POC Methadone UR 11/20/2020 None Detected  NONE DETECTED (Cut Off Level 300 ng/mL) Final   POC Marijuana UR 11/20/2020 Positive (A) NONE DETECTED (Cut Off Level 50 ng/mL) Final   SARS Coronavirus 2 Ag 11/19/2020 Negative  Negative Final   SARSCOV2ONAVIRUS 2 AG 11/19/2020 NEGATIVE  NEGATIVE Final   Comment: (NOTE) SARS-CoV-2 antigen NOT DETECTED.   Negative results are presumptive.  Negative results do not preclude SARS-CoV-2 infection and should not be used as the sole basis for treatment or other patient management decisions, including infection  control decisions, particularly in the presence of clinical signs and  symptoms consistent with COVID-19, or in those who have been in contact with the virus.  Negative  results must be combined with clinical observations, patient history, and epidemiological information. The expected result is Negative.  Fact Sheet for Patients: HandmadeRecipes.com.cy  Fact Sheet for Healthcare Providers: FuneralLife.at  This test is not yet approved or cleared by the [place] FDA and  has been authorized for detection and/or diagnosis of SARS-CoV-2 by FDA under an Emergency Use Authorization (EUA).  This EUA will remain in effect (meaning this test can be used) for the duration of  the COV                          ID-19 declaration under Section 564(b)(1) of the Act, 21 U.S.C. section 360bbb-3(b)(1), unless the authorization is terminated or revoked sooner.    Office Visit on 10/27/2020  Component Date Value Ref Range Status   Glucose, Bld 10/27/2020 87  65 - 99 mg/dL Final   Comment: .            Fasting reference interval .    BUN 10/27/2020 15  7 - 25 mg/dL Final   Creat 10/27/2020 0.98  0.60 - 1.29 mg/dL Final   eGFR 10/27/2020 95  > OR = 60 mL/min/1.80m Final   Comment: The eGFR is based on the CKD-EPI 2021 equation. To calculate  the new eGFR from a previous Creatinine or Cystatin C result, go to https://www.kidney.org/professionals/ kdoqi/gfr%5Fcalculator    BUN/Creatinine Ratio 085/27/7824NOT APPLICABLE  6 - 22 (calc) Final   Sodium 10/27/2020 142  135 - 146 mmol/L Final   Potassium 10/27/2020 4.3  3.5 - 5.3 mmol/L Final   Chloride 10/27/2020 104  98 - 110 mmol/L Final   CO2 10/27/2020 29  20 - 32 mmol/L Final   Calcium 10/27/2020 9.6  8.6 - 10.3 mg/dL Final   Total Protein 10/27/2020 7.3  6.1 - 8.1 g/dL Final   Albumin 10/27/2020 4.3  3.6 - 5.1 g/dL Final   Globulin 10/27/2020 3.0  1.9 - 3.7 g/dL (calc) Final   AG Ratio 10/27/2020 1.4  1.0 - 2.5 (calc) Final   Total Bilirubin 10/27/2020 0.6  0.2 - 1.2 mg/dL Final   Alkaline phosphatase (APISO) 10/27/2020 76  36 - 130 U/L Final   AST  10/27/2020 15  10 - 40 U/L Final   ALT 10/27/2020 11  9 - 46 U/L Final   HIV 1 RNA Quant 10/27/2020 Not Detected  Copies/mL Final   HIV-1 RNA Quant, Log 10/27/2020 Not Detected  Log cps/mL Final   Comment: . Reference Range:                           Not Detected     copies/mL                           Not Detected Log copies/mL . .Marland KitchenThe test was performed using Real-Time Polymerase Chain Reaction. . . Reportable Range: 20 copies/mL to 10,000,000 copies/mL (1.30 Log copies/mL to 7.00 Log copies/mL). .    CD4 T Cell Abs 10/27/2020 462  400 - 1,790 /uL Final   CD4 % Helper T Cell 10/27/2020 44  33 - 65 % Final   [facility] [address], [place] [address]  RPR Ser Ql 10/27/2020 NON-REACTIVE  NON-REACTIVE Final  Admission on 09/17/2020, Discharged on 09/23/2020  Component Date Value Ref Range Status   Opiates 09/18/2020 NONE DETECTED  NONE DETECTED Final   Cocaine 09/18/2020 POSITIVE (A) NONE DETECTED Final   Benzodiazepines 09/18/2020 NONE DETECTED  NONE DETECTED Final   Amphetamines 09/18/2020 NONE DETECTED  NONE DETECTED Final   Tetrahydrocannabinol 09/18/2020 POSITIVE (A) NONE DETECTED Final   Barbiturates 09/18/2020 NONE DETECTED  NONE DETECTED Final   Comment: (NOTE) DRUG SCREEN FOR MEDICAL PURPOSES ONLY.  IF CONFIRMATION IS NEEDED FOR ANY PURPOSE, NOTIFY LAB WITHIN 5 DAYS.  LOWEST DETECTABLE LIMITS FOR URINE DRUG SCREEN Drug Class                     Cutoff (ng/mL) Amphetamine and metabolites    1000 Barbiturate and metabolites    200 Benzodiazepine                 2144Tricyclics and metabolites     300 Opiates and metabolites        300 Cocaine and metabolites        300 THC  50 Performed at [facility], [place] [address], [place], [place] [address]    Cholesterol 09/18/2020 194  0 - 200 mg/dL Final   Triglycerides 09/18/2020 86  <150 mg/dL Final   HDL 09/18/2020 83  >40  mg/dL Final   Total CHOL/HDL Ratio 09/18/2020 2.3  RATIO Final   VLDL 09/18/2020 17  0 - 40 mg/dL Final   LDL Cholesterol 09/18/2020 94  0 - 99 mg/dL Final   Comment:        Total Cholesterol/HDL:CHD Risk Coronary Heart Disease Risk Table                     Men   Women  1/2 Average Risk   3.4   3.3  Average Risk       5.0   4.4  2 X Average Risk   9.6   7.1  3 X Average Risk  23.4   11.0        Use the calculated Patient Ratio above and the CHD Risk Table to determine the patient's CHD Risk.        ATP III CLASSIFICATION (LDL):  <100     mg/dL   Optimal  100-129  mg/dL   Near or Above                    Optimal  130-159  mg/dL   Borderline  160-189  mg/dL   High  >190     mg/dL   Very High Performed at [facility] [address], [place], [place] [address]    TSH 09/18/2020 5.149 (A) 0.350 - 4.500 uIU/mL Final   Comment: Performed by a 3rd Generation assay with a functional sensitivity of <=0.01 uIU/mL. Performed at [facility], [place] [address], [place], [place] [address]    Hgb A1c MFr Bld 09/18/2020 5.2  4.8 - 5.6 % Final   Comment: (NOTE) Pre diabetes:          5.7%-6.4%  Diabetes:              >6.4%  Glycemic control for   <7.0% adults with diabetes    Mean Plasma Glucose 09/18/2020 102.54  mg/dL Final   Performed at [facility], [place] [address] [place], [place] [address]   Free T4 09/18/2020 0.69  0.61 - 1.12 ng/dL Final   Comment: (NOTE) Biotin ingestion may interfere with free T4 tests. If the results are inconsistent with the TSH level, previous test results, or the clinical presentation, then consider biotin interference. If needed, order repeat testing after stopping biotin. Performed at [facility], [place] [address] [place], [place] [address]    T3, Free 09/21/2020 2.2  2.0 - 4.4 pg/mL Final   Comment: (NOTE) Performed At: [facility] [place], [place] [id number] Rush Farmer MD [id number]   Admission on 09/16/2020, Discharged on 09/17/2020  Component Date Value Ref Range Status   SARS Coronavirus 2 by RT PCR 09/16/2020 NEGATIVE  NEGATIVE Final   Comment: (NOTE) SARS-CoV-2 target nucleic acids are NOT DETECTED.  The SARS-CoV-2 RNA is generally detectable in upper respiratory specimens during the acute phase of infection. The lowest concentration of SARS-CoV-2 viral copies this assay can detect is 138 copies/mL. A negative result does not preclude SARS-Cov-2 infection and should not be used as the sole basis for treatment or other patient management decisions. A negative result may occur with  improper  specimen collection/handling, submission of specimen other than nasopharyngeal swab, presence of viral mutation(s) within the areas targeted by this assay, and inadequate number of viral copies(<138 copies/mL). A negative result must be combined with clinical observations, patient history, and epidemiological information. The expected result is Negative.  Fact Sheet for Patients:  EntrepreneurPulse.com.au  Fact Sheet for Healthcare Providers:  IncredibleEmployment.be  This test is no                          t yet approved or cleared by the [place] FDA and  has been authorized for detection and/or diagnosis of SARS-CoV-2 by FDA under an Emergency Use Authorization (EUA). This EUA will remain  in effect (meaning this test can be used) for the duration of the COVID-19 declaration under Section 564(b)(1) of the Act, 21 U.S.C.section 360bbb-3(b)(1), unless the authorization is terminated  or revoked sooner.       Influenza A by PCR 09/16/2020 NEGATIVE  NEGATIVE Final   Influenza B by PCR 09/16/2020 NEGATIVE  NEGATIVE Final   Comment: (NOTE) The Xpert Xpress SARS-CoV-2/FLU/RSV plus assay is intended as an aid in the diagnosis of influenza from Nasopharyngeal swab specimens and should not be used as a  sole basis for treatment. Nasal washings and aspirates are unacceptable for Xpert Xpress SARS-CoV-2/FLU/RSV testing.  Fact Sheet for Patients: EntrepreneurPulse.com.au  Fact Sheet for Healthcare Providers: IncredibleEmployment.be  This test is not yet approved or cleared by the [place] FDA and has been authorized for detection and/or diagnosis of SARS-CoV-2 by FDA under an Emergency Use Authorization (EUA). This EUA will remain in effect (meaning this test can be used) for the duration of the COVID-19 declaration under Section 564(b)(1) of the Act, 21 U.S.C. section 360bbb-3(b)(1), unless the authorization is terminated or revoked.  Performed at [facility], [place] [address], [place], [place] [address]    Sodium 09/16/2020 139  135 - 145 mmol/L Final   Potassium 09/16/2020 4.1  3.5 - 5.1 mmol/L Final   Chloride 09/16/2020 104  98 - 111 mmol/L Final   CO2 09/16/2020 24  22 - 32 mmol/L Final   Glucose, Bld 09/16/2020 60 (A) 70 - 99 mg/dL Final   Glucose reference range applies only to samples taken after fasting for at least 8 hours.   BUN 09/16/2020 13  6 - 20 mg/dL Final   Creatinine, Ser 09/16/2020 1.18  0.61 - 1.24 mg/dL Final   Calcium 09/16/2020 9.4  8.9 - 10.3 mg/dL Final   Total Protein 09/16/2020 7.1  6.5 - 8.1 g/dL Final   Albumin 09/16/2020 4.0  3.5 - 5.0 g/dL Final   AST 09/16/2020 23  15 - 41 U/L Final   ALT 09/16/2020 17  0 - 44 U/L Final   Alkaline Phosphatase 09/16/2020 91  38 - 126 U/L Final   Total Bilirubin 09/16/2020 0.4  0.3 - 1.2 mg/dL Final   GFR, Estimated 09/16/2020 >60  >60 mL/min Final   Comment: (NOTE) Calculated using the CKD-EPI Creatinine Equation (2021)    Anion gap 09/16/2020 11  5 - 15 Final   Performed at [facility] [address], [place], [place] [address]   Alcohol, Ethyl (B) 09/16/2020 <10  <10 mg/dL Final   Comment: (NOTE) Lowest detectable limit for serum alcohol is 10  mg/dL.  For medical purposes only. Performed at [name], [place] [address] [place], [place] [address]    WBC 09/16/2020 4.0  4.0 - 10.5 K/uL Final   RBC 09/16/2020 4.75  4.22 - 5.81 MIL/uL Final   Hemoglobin 09/16/2020 15.6  13.0 - 17.0 g/dL Final   HCT 09/16/2020 47.7  39.0 - 52.0 % Final   MCV 09/16/2020 100.4 (A) 80.0 - 100.0 fL Final   MCH 09/16/2020 32.8  26.0 - 34.0 pg Final   MCHC 09/16/2020 32.7  30.0 - 36.0 g/dL Final   RDW 09/16/2020 12.6  11.5 - 15.5 % Final   Platelets 09/16/2020 405 (A) 150 - 400 K/uL Final   nRBC 09/16/2020 0.0  0.0 - 0.2 % Final   Neutrophils Relative % 09/16/2020 18  % Final   Neutro Abs 09/16/2020 0.7 (A) 1.7 - 7.7 K/uL Final   Lymphocytes Relative 09/16/2020 59  % Final   Lymphs Abs 09/16/2020 2.4  0.7 - 4.0 K/uL Final   Monocytes Relative 09/16/2020 8  % Final   Monocytes Absolute 09/16/2020 0.3  0.1 - 1.0 K/uL Final   Eosinophils Relative 09/16/2020 12  % Final   Eosinophils Absolute 09/16/2020 0.5  0.0 - 0.5 K/uL Final   Basophils Relative 09/16/2020 3  % Final   Basophils Absolute 09/16/2020 0.1  0.0 - 0.1 K/uL Final   WBC Morphology 09/16/2020 See Note   Final   Smudge Cells   nRBC 09/16/2020 0  0 /100 WBC Final   Abs Immature Granulocytes 09/16/2020 0.00  0.00 - 0.07 K/uL Final   Performed at [facility], [place] [address] [place], [place] [address]   Path Review 09/16/2020 NEUTROPENIA   Final   Comment: Reviewed by Chrystie Nose. Saralyn Pilar, M.D. 09/17/2020 Performed at [facility], [place] [address], [place], [place] [address]    Glucose-Capillary 09/16/2020 107 (A) 70 - 99 mg/dL Final   Glucose reference range applies only to samples taken after fasting for at least 8 hours.  Admission on 08/20/2020, Discharged on 08/21/2020  Component Date Value Ref Range Status   SARS Coronavirus 2 by RT PCR 08/20/2020 POSITIVE (A) NEGATIVE Final   Comment: RESULT CALLED TO, READ BACK BY AND VERIFIED WITH: RN JASMINE J. [id number]  FCP (NOTE) SARS-CoV-2 target nucleic acids are DETECTED.  The SARS-CoV-2 RNA is generally detectable in upper respiratory specimens during the acute phase of infection. Positive results are indicative of the presence of the identified virus, but do not rule out bacterial infection or co-infection with other pathogens not detected by the test. Clinical correlation with patient history and other diagnostic information is necessary to determine patient infection status. The expected result is Negative.  Fact Sheet for Patients: EntrepreneurPulse.com.au  Fact Sheet for Healthcare Providers: IncredibleEmployment.be  This test is not yet approved or cleared by the [place] FDA and  has been authorized for detection and/or diagnosis of SARS-CoV-2 by FDA under an Emergency Use Authorization (EUA).  This EUA will remain in effect (meaning this test can be use                          d) for the duration of  the COVID-19 declaration under Section 564(b)(1) of the Act, 21 U.S.C. section 360bbb-3(b)(1), unless the authorization is terminated or revoked sooner.     Influenza A by PCR 08/20/2020 NEGATIVE  NEGATIVE Final   Influenza B by PCR 08/20/2020 NEGATIVE  NEGATIVE Final   Comment: (NOTE) The Xpert Xpress SARS-CoV-2/FLU/RSV plus assay is intended as an aid in the diagnosis of influenza from Nasopharyngeal swab  specimens and should not be used as a sole basis for treatment. Nasal washings and aspirates are unacceptable for Xpert Xpress SARS-CoV-2/FLU/RSV testing.  Fact Sheet for Patients: EntrepreneurPulse.com.au  Fact Sheet for Healthcare Providers: IncredibleEmployment.be  This test is not yet approved or cleared by the [place] FDA and has been authorized for detection and/or diagnosis of SARS-CoV-2 by FDA under an Emergency Use Authorization (EUA). This EUA will remain in effect (meaning this test  can be used) for the duration of the COVID-19 declaration under Section 564(b)(1) of the Act, 21 U.S.C. section 360bbb-3(b)(1), unless the authorization is terminated or revoked.  Performed at [facility], [place] [address] [place], [place] [address]   Office Visit on 08/07/2020  Component Date Value Ref Range Status   Sed Rate 08/07/2020 6  0 - 15 mm/h Final   CRP 08/07/2020 4.3  <8.0 mg/L Final   HIV 1 RNA Quant 08/07/2020 Not Detected  Copies/mL Final   HIV-1 RNA Quant, Log 08/07/2020 Not Detected  Log cps/mL Final   Comment: . Reference Range:                           Not Detected     copies/mL                           Not Detected Log copies/mL . Marland Kitchen The test was performed using Real-Time Polymerase Chain Reaction. . . Reportable Range: 20 copies/mL to 10,000,000 copies/mL (1.30 Log copies/mL to 7.00 Log copies/mL). .   Office Visit on 07/10/2020  Component Date Value Ref Range Status   Glucose, Bld 07/10/2020 66  65 - 99 mg/dL Final   Comment: .            Fasting reference interval .    BUN 07/10/2020 14  7 - 25 mg/dL Final   Creat 07/10/2020 0.83  0.60 - 1.35 mg/dL Final   GFR, Est Non African American 07/10/2020 103  > OR = 60 mL/min/1.74m Final   GFR, Est African American 07/10/2020 120  > OR = 60 mL/min/1.72mFinal   BUN/Creatinine Ratio 0465/78/4696OT APPLICABLE  6 - 22 (calc) Final   Sodium 07/10/2020 139  135 - 146 mmol/L Final   Potassium 07/10/2020 4.2  3.5 - 5.3 mmol/L Final   Chloride 07/10/2020 102  98 - 110 mmol/L Final   CO2 07/10/2020 24  20 - 32 mmol/L Final   Calcium 07/10/2020 9.5  8.6 - 10.3 mg/dL Final   Total Protein 07/10/2020 7.4  6.1 - 8.1 g/dL Final   Albumin 07/10/2020 4.8  3.6 - 5.1 g/dL Final   Globulin 07/10/2020 2.6  1.9 - 3.7 g/dL (calc) Final   AG Ratio 07/10/2020 1.8  1.0 - 2.5 (calc) Final   Total Bilirubin 07/10/2020 0.3  0.2 - 1.2 mg/dL Final   Alkaline phosphatase (APISO) 07/10/2020 76  36 - 130 U/L Final   AST  07/10/2020 29  10 - 40 U/L Final   ALT 07/10/2020 17  9 - 46 U/L Final   HIV 1 RNA Quant 07/10/2020 Not Detected  Copies/mL Final   HIV-1 RNA Quant, Log 07/10/2020 Not Detected  Log cps/mL Final   Comment: . Reference Range:                           Not Detected     copies/mL  Not Detected Log copies/mL . Marland Kitchen The test was performed using Real-Time Polymerase Chain Reaction. . . Reportable Range: 20 copies/mL to 10,000,000 copies/mL (1.30 Log copies/mL to 7.00 Log copies/mL). .    CD4 T Cell Abs 07/10/2020 1,031  400 - 1,790 /uL Final   CD4 % Helper T Cell 07/10/2020 46  33 - 65 % Final   Performed at [facility], [place] [address], [place], [place] [address]    Allergies: Shellfish allergy  PTA Medications: (Not in a hospital admission)   Medical Decision Making  Patient is a 50 year old male with past significant psychiatric and medical history as stated above who [facility] to the [facility] as a voluntary direct admit transfer from [facility] emergency department for SI and HI (see HPI for details).  Based on patient's current presentation of SI with plan, believe that the patient is a potential threat to himself at this time and recommend continuous assessment for the patient at this time.    Recommendations  Based on my evaluation the patient does not appear to have an emergency medical condition. Patient will be admitted to [facility] continues assessment for further crisis stabilization and treatment.  Patient will be reevaluated by the treatment team on 12/23/2020 and disposition to be determined at that time.  12/22/20 ED labs reviewed:  -PCR Flu A&B, COVID: Negative  -UDS: Positive for cocaine and THC  -CBC: Within normal limits  -Acetaminophen level: Less than 10 ug/mL/within normal limits  -Salicylate level: Less than 7.0 mg/dL/within normal limits  -Ethanol: Less than 10 mg/dL/within normal limits  -CMP: Glucose slightly elevated at  104 mg/dL, essentially normal.  CMP otherwise unremarkable.  -Due to patient being on antipsychotic medications, hemoglobin A1c, TSH, and lipid panel ordered.  Nursing staff unable to obtain blood work on the patient at this time.  Per lab, hemoglobin A1c can be added onto patient's previous blood draw from the ED earlier on 12/22/2020, but TSH and lipid panel cannot be added on.  Hemoglobin A1c added onto patient's previous ED 12/22/2020 blood draw: Results pending.  Patient's previous labs reviewed: 09/18/2020 hemoglobin A1c within normal limits at 5.2%.  09/18/2020 TSH elevated at 5.149 uIU/mL with 09/18/2020 free T4 and free T3 values within normal limits.  09/18/2020 lipid panel values within normal limits.  Recommend that nursing attempt to obtain blood work from the patient during the day on 12/23/2020 if possible to recheck patient's TSH and lipid panel levels.  Patient's most recent 11/19/2020 EKG reviewed, which shows normal sinus rhythm and no acute/concerning findings with QT/QTC of 408/414 ms.  Patient's QT/QTC appropriate for continuation of antipsychotic medications at this time.  We will continue the following home medications:  -BuSpar 7.5 mg p.o. twice daily for GAD  -Remeron 15 mg p.o. daily at bedtime for GAD, insomnia, PTSD, bipolar disorder  -Seroquel 100 mg p.o. daily at bedtime for bipolar disorder, mood stability  -Trazodone 100 mg p.o. at bedtime as needed for sleep/insomnia  -Albuterol inhaler 2 puffs inhalation every 4 hours as needed for wheezing/shortness of breath  -Symtuza 800-150-200-10 mg tablet: 1 tablet p.o. every morning for HIV  Patient denies history of alcohol/substance withdrawal symptoms or seizures.  Patient does not appear to be experiencing substance withdrawal symptoms at this time.  Additionally, per chart review of patient's recent 11/19/20 - 11/24/20 [facility] admission, it appears that patient was not placed on any withdrawal protocol during this admission. Thus, no CIWA  or withdrawal protocols initiated at this time.  Prescilla Sours, PA-C 12/23/20  2:48 AM

## 2020-12-23 NOTE — ED Notes (Signed)
Attempted to draw patients blood however no success. Pt has been brought on the unit and is now in bed. Pt brought an inhaler with him which has been placed in the patient bin labeled with a sticker in the medication room.

## 2020-12-23 NOTE — Progress Notes (Signed)
CSW spoke with patient who reported he was not interested in going to [facility]. Patient reports interest in [facility].  CSW informed patient that they can put in a referral but cannot guarantee bed availability and he would need to call daily for bed availability. CSW also informed patient that he would need to participate in screener for acceptance.  Patient apprehensive about participating in screener.    CSW put in a referral to [facility] and provided patient with number to participate in screener.    Patient also open to halfway house with contact from another patient in obs. Patient agreed to call and see if he can be accepted to halfway house.    Jael Waldorf, LCSW, [place] Social Worker  [facility]

## 2020-12-23 NOTE — ED Notes (Signed)
Given snack. 

## 2020-12-23 NOTE — ED Notes (Signed)
No pain or discomfort noted/ reported. Pt alert, oriented, and ambulatory.  Pt woke briefly to eat breakfast and is currently resting with eyes closed in recliner. Breathing is even and  unlabored.  Will continue to monitor for safety.

## 2020-12-23 NOTE — ED Notes (Signed)
Pt has been brought back on the unit, food and nutrition has been offered which patient accepted.

## 2020-12-23 NOTE — ED Provider Notes (Signed)
FBC/OBS ASAP Discharge Summary  Date and Time: 12/23/2020 10:03 AM  Name: John [name]  MRN:  [id number]   Discharge Diagnoses:  Final diagnoses:  Bipolar disorder, curr episode mixed, severe, with psychotic features ([place])  Generalized anxiety disorder  PTSD (post-traumatic stress disorder)  Insomnia due to other mental disorder  Cannabis use disorder  Cocaine use disorder ([place])  Alcohol abuse    Subjective:John [name] is a 50 year old male with past psychiatric history significant for bipolar disorder with psychotic features, MDD recurrent severe with psychosis, GAD, PTSD, insomnia, alcohol use disorder/dependence, cocaine use disorder/dependence, cannabis use disorder/dependence, substance-induced mood disorder, as well as past medical history significant for HIV and asthma, who presents to the [facility] behavioral health urgent care [facility] as a voluntary direct admit transfer from [facility] emergency department. Per chart review, patient presented to [facility] ED on 12/22/2020 for SI with plan to jump off a bridge. Patient was evaluated by TTS in the emergency department on 12/22/2020 and it was recommended for the patient to be observed overnight for safety and stability. It was ultimately recommended by [facility] and this provider for the patient to be transferred from the ED to [facility] for continuous assessment. Please see 12/22/2020 Viviana Simpler, LCAS note for further details regarding patient's 12/22/2020 TTS evaluation if necessary.  Stay Summary: Patient seen and reevaluated face-to-face by this provider, chart reviewed and case discussed with Dr. Dwyane Dee. On evaluation, patient is alert and oriented x4. His thought process is logical and speech is coherent. His mood is dysphoric and affect is congruent. He denies having thoughts of wanting to kill himself or others. He denies auditory hallucinations but reports seeing spots on the wall. He reports that the visual hallucinations  usually go away after he is back on his medications. He does not appear to be responding to internal or external stimuli. He reports that he came in needing to get started back on his medications. He reports that he stopped taking his medications about 3 or 4 weeks ago because he did not like the way his medications made him feel at that time. He reports that the medications made him feel like a zombie, mostly with the Trazodone and Seroquel. John [name] was restarted back on his psychotropic medications: BuSpar, Remeron, Seroquel, and Trazodone. Today, he denies side effects to the stated medications. He states that he does not need a refill on the stated medications and he has medications in his bookbag. He states that he was seen upstairs ([facility]) by Ludwig Clarks (PA) last month for medication management. He was informed that his urine drug screen tested positive for cocaine and marijuana. He does admit to consuming the stated substances but does not provide further details. When asked if he is interested in going to residential substance abuse treatment, he is hesitant and states that he does not know. He later states that he is interested in substance abuse tx but shows no interest in wanting to complete the screening process. He states that he is working with his case worker, [name] on finding treatment. John [name] gives verbal consent for this provider to speak with [name] [phone]. John [name], states that the patient is uncooperative and is unpleasant to work with. He states that he got the patient income and medicaid but has been refusing to follow up with getting an ACT team or agreeing to go to residential substance abuse treatment. He states that he has been working with the patient for four  years and the patient continues to be uncooperative. He states  that the patient will most likely be referred to another agency. He states that he has no further resources for the patient at this  time. John [name] was informed that John [name] does not have any additional resources for him at this time. John [name] states that he will go back to the streets until [facility] can find him placement. He states that he is trying to get [place] housing.   Melissa-CSW., met with the patient and provided the patient with resources for shelter and made a referral for substance abuse treatment. Patient agreed to be discharged to the [facility]. His caseworker is "checking on some things" with the halfway house and he knows to call [facility] for a screener and bed availability.  Total Time spent with patient: 15 minutes  Past Psychiatric History:  Past psychiatric history significant for bipolar disorder with psychotic features, MDD recurrent severe with psychosis, GAD, PTSD, insomnia, alcohol use disorder/dependence, cocaine use disorder/dependence, cannabis use disorder/dependence, substance-induced mood disorder. [employer] admission from 11/19/20 - 11/24/20, [facility] admission from 09/17/20 - 09/23/20.  Patient has been psychiatrically hospitalized at [facility] and [facility] on multiple other occasions in the past as well.   Past Medical History:  Past Medical History:  Diagnosis Date   Alcohol abuse    Alcoholism ([place])    Anxiety    Asthma    Asthma    Bipolar disorder ([place])    Chronic low back pain    Cocaine abuse ([place])    Depression    Gout    Gout    HIV (human immunodeficiency virus infection) ([place])    "dx'd ~ 2 yr ago" (09/29/2012)   HIV (human immunodeficiency virus infection) ([place])    HIV disease ([place])    Homelessness    Hypertension    Marijuana abuse    Mental disorder    Schizophrenia ([place])     Past Surgical History:  Procedure Laterality Date   SKIN GRAFT FULL THICKNESS LEG Left ?   POSTERIOR LEFT LEG  AFTER DOG BITES   Family History:  Family History  Problem Relation Age of Onset   Alcoholism Mother    Depression Mother    Alcoholism Brother    Family Psychiatric History: No family hx  reported. Social History:  Social History   Substance and Sexual Activity  Alcohol Use Yes   Alcohol/week: 12.0 standard drinks   Types: 12 Cans of beer per week   Comment: Daily. "As much as I can get."     Social History   Substance and Sexual Activity  Drug Use Yes   Types: Marijuana, "Crack" cocaine   Comment: States been drinking 4-40oz beers daily.    Social History   Socioeconomic History   Marital status: Single    Spouse name: Not on file   Number of children: 0   Years of education: Not on file   Highest education level: 9th grade  Occupational History   Occupation: unemployed  Tobacco Use   Smoking status: Every Day    Packs/day: 0.50    Years: 27.00    Pack years: 13.50    Types: Cigarettes   Smokeless tobacco: Never  Vaping Use   Vaping Use: Never used  Substance and Sexual Activity   Alcohol use: Yes    Alcohol/week: 12.0 standard drinks    Types: 12 Cans of beer per week    Comment: Daily. "As much as I can get."  Drug use: Yes    Types: Marijuana, "Crack" cocaine    Comment: States been drinking 4-40oz beers daily.   Sexual activity: Not Currently    Partners: Male    Birth control/protection: Condom    Comment: given condoms 04/10/20  Other Topics Concern   Not on file  Social History Narrative   ** Merged History Encounter **       ** Merged History Encounter **       Social Determinants of Health   Financial Resource Strain: High Risk   Difficulty of Paying Living Expenses: Very hard  Food Insecurity: Landscape architect Present   Worried About Charity fundraiser in the Last Year: Sometimes true   Arboriculturist in the Last Year: Sometimes true  Transportation Needs: Public librarian (Medical): Yes   Lack of Transportation (Non-Medical): Yes  Physical Activity: Inactive   Days of Exercise per Week: 0 days   Minutes of Exercise per Session: 0 min  Stress: Stress Concern Present   Feeling of Stress  : Rather much  Social Connections: Moderately Isolated   Frequency of Communication with Friends and Family: Three times a week   Frequency of Social Gatherings with Friends and Family: Three times a week   Attends Religious Services: Never   Active Member of Clubs or Organizations: Yes   Attends Music therapist: More than 4 times per year   Marital Status: Never married   SDOH:  SDOH Screenings   Alcohol Screen: Medium Risk   Last Alcohol Screening Score (AUDIT): 31  Depression (PHQ2-9): Medium Risk   PHQ-2 Score: 18  Financial Resource Strain: High Risk   Difficulty of Paying Living Expenses: Very hard  Food Insecurity: Landscape architect Present   Worried About Charity fundraiser in the Last Year: Sometimes true   Ran Out of Food in the Last Year: Sometimes true  Housing: Medium Risk   Last Housing Risk Score: 1  Physical Activity: Inactive   Days of Exercise per Week: 0 days   Minutes of Exercise per Session: 0 min  Social Connections: Moderately Isolated   Frequency of Communication with Friends and Family: Three times a week   Frequency of Social Gatherings with Friends and Family: Three times a week   Attends Religious Services: Never   Active Member of Clubs or Organizations: Yes   Attends Music therapist: More than 4 times per year   Marital Status: Never married  Stress: Stress Concern Present   Feeling of Stress : Rather much  Tobacco Use: High Risk   Smoking Tobacco Use: Every Day   Smokeless Tobacco Use: Never  Transportation Needs: Unmet Transportation Needs   Lack of Transportation (Medical): Yes   Lack of Transportation (Non-Medical): Yes    Tobacco Cessation:  Prescription not provided because: Patient declined   Current Medications:  Current Facility-Administered Medications  Medication Dose Route Frequency Provider Last Rate Last Admin   acetaminophen (TYLENOL) tablet 650 mg  650 mg Oral Q6H PRN Prescilla Sours, PA-C   650 mg  at 12/23/20 0954   albuterol (VENTOLIN HFA) 108 (90 Base) MCG/ACT inhaler 2 puff  2 puff Inhalation Q4H PRN Prescilla Sours, PA-C       alum & mag hydroxide-simeth (MAALOX/MYLANTA) 200-200-20 MG/5ML suspension 30 mL  30 mL Oral Q4H PRN Margorie John W, PA-C       busPIRone (BUSPAR) tablet 7.5 mg  7.5 mg Oral  BID Margorie John W, PA-C   7.5 mg at 12/23/20 4008   Darunavir-Cobicistat-Emtricitabine-Tenofovir Alafenamide (SYMTUZA) 800-150-200-10 MG TABS 1 tablet  1 tablet Oral q morning Margorie John W, PA-C       hydrOXYzine (ATARAX/VISTARIL) tablet 25 mg  25 mg Oral TID PRN Prescilla Sours, PA-C   25 mg at 12/23/20 0954   magnesium hydroxide (MILK OF MAGNESIA) suspension 30 mL  30 mL Oral Daily PRN Prescilla Sours, PA-C       mirtazapine (REMERON) tablet 15 mg  15 mg Oral QHS Lovena Le, Cody W, PA-C       QUEtiapine (SEROQUEL) tablet 100 mg  100 mg Oral QHS Lovena Le, Cody W, PA-C       traZODone (DESYREL) tablet 100 mg  100 mg Oral QHS PRN Prescilla Sours, PA-C       Current Outpatient Medications  Medication Sig Dispense Refill   albuterol (VENTOLIN HFA) 108 (90 Base) MCG/ACT inhaler Inhale 2 puffs into the lungs every 4 (four) hours as needed for wheezing or shortness of breath. 18 g 0   busPIRone (BUSPAR) 7.5 MG tablet Take 1 tablet (7.5 mg total) by mouth 2 (two) times daily. 60 tablet 1   mirtazapine (REMERON) 15 MG tablet Take 1 tablet (15 mg total) by mouth at bedtime. 30 tablet 1   QUEtiapine (SEROQUEL) 100 MG tablet Take 1 tablet (100 mg total) by mouth at bedtime. 30 tablet 1   SYMTUZA 800-150-200-10 MG TABS Take 1 tablet by mouth every morning. 30 tablet 0   traZODone (DESYREL) 100 MG tablet Take 1 tablet (100 mg total) by mouth at bedtime as needed for sleep. 30 tablet 1    PTA Medications: (Not in a hospital admission)   Musculoskeletal  Strength & Muscle Tone: within normal limits Gait & Station: normal Patient leans: N/A  Psychiatric Specialty Exam  Presentation  General Appearance:  Disheveled; Appropriate for Environment  Eye Contact:Fair  Speech:Clear and Coherent  Speech Volume:Normal  Handedness:Right   Mood and Affect  Mood:Dysphoric  Affect:Congruent   Thought Process  Thought Processes:Coherent; Goal Directed  Descriptions of Associations:Intact  Orientation:Full (Time, Place and Person)  Thought Content:Logical  Diagnosis of Schizophrenia or Schizoaffective disorder in past: No  Duration of Psychotic Symptoms: Less than six months   Hallucinations:Hallucinations: Visual  Ideas of Reference:None  Suicidal Thoughts:Suicidal Thoughts: No SI Active Intent and/or Plan: With Intent; With Plan; With Means to Combs; With Access to Means  Homicidal Thoughts:Homicidal Thoughts: No   Sensorium  Memory:Immediate Fair; Recent Fair; Remote Fair  Judgment:Fair  Insight:Fair   Executive Functions  Concentration:Fair  Attention Span:Fair  [place]   Psychomotor Activity  Psychomotor Activity:Psychomotor Activity: Normal   Assets  Assets:Communication Skills; Desire for Improvement; Financial Resources/Insurance; Leisure Time; Social Support   Sleep  Sleep:Sleep: Fair Number of Hours of Sleep: 2   Nutritional Assessment (For OBS and FBC admissions only) Has the patient had a weight loss or gain of 10 pounds or more in the last 3 months?: -- (Patient states he has lost weight, but is not sure of how much weight he has lost.) Has the patient had a decrease in food intake/or appetite?: Yes Does the patient have dental problems?: No Does the patient have eating habits or behaviors that may be indicators of an eating disorder including binging or inducing vomiting?: No Has the patient recently lost weight without trying?: 2.0 Has the patient been eating poorly because of a  decreased appetite?: 1 Malnutrition Screening Tool Score: 3 Nutritional Assessment Referrals: Refer to Primary Care  Provider    Physical Exam  Physical Exam Constitutional:      Appearance: Normal appearance.  HENT:     Head: Atraumatic.     Nose: Nose normal.  Eyes:     Conjunctiva/sclera: Conjunctivae normal.  Cardiovascular:     Rate and Rhythm: Normal rate.  Pulmonary:     Effort: Pulmonary effort is normal.  Musculoskeletal:     Cervical back: Normal range of motion.  Neurological:     Mental Status: He is alert and oriented to person, place, and time.   Review of Systems  Constitutional: Negative.   HENT: Negative.    Eyes: Negative.   Respiratory: Negative.    Cardiovascular: Negative.   Gastrointestinal: Negative.   Genitourinary: Negative.   Skin: Negative.   Neurological: Negative.   Endo/Heme/Allergies: Negative.   Psychiatric/Behavioral:  Positive for hallucinations and substance abuse.   Blood pressure 111/73, pulse 69, temperature 97.7 F (36.5 C), temperature source Oral, SpO2 99 %. There is no height or weight on file to calculate BMI.  Demographic Factors:  Male, Low socioeconomic status, Living alone, and Unemployed  Loss Factors: Financial problems/change in socioeconomic status  Historical Factors: Prior suicide attempts  Risk Reduction Factors:   Religious beliefs about death  Continued Clinical Symptoms:  Previous Psychiatric Diagnoses and Treatments  Cognitive Features That Contribute To Risk:  None    Suicide Risk:  Minimal: No identifiable suicidal ideation.  Patients presenting with no risk factors but with morbid ruminations; may be classified as minimal risk based on the severity of the depressive symptoms  Plan Of Care/Follow-up recommendations:  Activity:  as tolerated    Follow-up [place] Follow up.   Specialty: Addiction Medicine Why: A referral has been placed on your behalf.  For admission, you will need to call and participate in a screener and then call daily for bed availability  for residential program. Contact information: [place] [address] [phone]         [facility]). Go to.   Why: Day shelter to get connected to resources. Contact information: [address], [place], [place] [address], ph: [phone] [phone]               Disposition: discharge to [facility], Marbeth Smedley L, NP 12/23/2020, 10:03 AM

## 2020-12-23 NOTE — ED Notes (Signed)
Pt discharged with  AVS.  AVS reviewed prior to discharge.  Pt alert, oriented, and ambulatory.  Safety maintained.  °

## 2020-12-23 NOTE — ED Notes (Signed)
Pt sleeping@this time. Breathing even and unlabored. Will continue to monitor for safety 

## 2020-12-23 NOTE — ED Notes (Signed)
Pt  in bed sleep with eyes closed, respirations are even/unlabored, environment check complete/secure, will continue to monitor patient for safety

## 2020-12-23 NOTE — Discharge Instructions (Addendum)

## 2020-12-23 NOTE — ED Notes (Signed)
Pt complaint of left foot pain he states there is glass in his left toe which hurts really bad and prevents him from walking comfortably

## 2020-12-24 ENCOUNTER — Telehealth (HOSPITAL_COMMUNITY): Payer: Self-pay

## 2020-12-24 NOTE — BH Assessment (Signed)
Care Management - Follow Up [facility] Discharges   Writer attempted to make contact with patient today and was unsuccessful.  The phone number listed in epic is not the patient phone number.    Per chart review, patient was referred to [facility].

## 2020-12-31 ENCOUNTER — Other Ambulatory Visit: Payer: Self-pay

## 2020-12-31 ENCOUNTER — Emergency Department (HOSPITAL_COMMUNITY)
Admission: EM | Admit: 2020-12-31 | Discharge: 2020-12-31 | Disposition: A | Discharge status: Home / Self Care | Payer: Medicaid Other | Attending: Emergency Medicine | Admitting: Emergency Medicine

## 2020-12-31 ENCOUNTER — Encounter (HOSPITAL_COMMUNITY): Payer: Self-pay | Admitting: Emergency Medicine

## 2020-12-31 DIAGNOSIS — J45901 Unspecified asthma with (acute) exacerbation: Secondary | ICD-10-CM | POA: Diagnosis not present

## 2020-12-31 DIAGNOSIS — I10 Essential (primary) hypertension: Secondary | ICD-10-CM | POA: Insufficient documentation

## 2020-12-31 DIAGNOSIS — F17210 Nicotine dependence, cigarettes, uncomplicated: Secondary | ICD-10-CM | POA: Insufficient documentation

## 2020-12-31 DIAGNOSIS — Z21 Asymptomatic human immunodeficiency virus [HIV] infection status: Secondary | ICD-10-CM | POA: Diagnosis not present

## 2020-12-31 DIAGNOSIS — R0602 Shortness of breath: Secondary | ICD-10-CM | POA: Diagnosis present

## 2020-12-31 MED ORDER — PREDNISONE 20 MG PO TABS
60.0000 mg | ORAL_TABLET | Freq: Once | ORAL | Status: AC
  Administered 2020-12-31: 60 mg via ORAL
  Filled 2020-12-31: qty 3

## 2020-12-31 MED ORDER — IPRATROPIUM BROMIDE 0.02 % IN SOLN
0.5000 mg | Freq: Once | RESPIRATORY_TRACT | Status: AC
  Administered 2020-12-31: 0.5 mg via RESPIRATORY_TRACT
  Filled 2020-12-31: qty 2.5

## 2020-12-31 MED ORDER — ALBUTEROL SULFATE (2.5 MG/3ML) 0.083% IN NEBU: 5.0000 mg | INHALATION_SOLUTION | RESPIRATORY_TRACT | Status: DC | PRN

## 2020-12-31 MED ORDER — PREDNISONE 50 MG PO TABS: 50.0000 mg | ORAL_TABLET | Freq: Every day | ORAL | 0 refills | Status: DC

## 2020-12-31 MED ORDER — ALBUTEROL SULFATE HFA 108 (90 BASE) MCG/ACT IN AERS
1.0000 | INHALATION_SPRAY | Freq: Four times a day (QID) | RESPIRATORY_TRACT | Status: DC | PRN
  Filled 2020-12-31: qty 6.7
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
## 2020-12-31 NOTE — Discharge Instructions (Signed)
Continue albuterol inhaler.  Take the prednisone until it is finished.  Follow-up with your primary care doctor for further evaluation.  Return to the ED as needed for worsening symptoms

## 2020-12-31 NOTE — ED Triage Notes (Signed)
Pt BIBA.  Per EMS pt coming from [place] with c/o asthma attack about 20 minutes ago. Pt recently ran out of inhaler. Hx of asthma and HIV.   EMS reports pt had diminished lung sounds in all fields and expiratory wheezing After 1 neb treatment , pt improved with decreased wheezing.   BP 143/112 HR 92 100% room air

## 2020-12-31 NOTE — ED Notes (Signed)
Pt given turkey sandwich and sprite.  

## 2021-01-25 ENCOUNTER — Emergency Department (HOSPITAL_COMMUNITY): Payer: Medicaid Other

## 2021-01-25 ENCOUNTER — Other Ambulatory Visit: Payer: Self-pay

## 2021-01-25 ENCOUNTER — Emergency Department (HOSPITAL_COMMUNITY)
Admission: EM | Admit: 2021-01-25 | Discharge: 2021-01-25 | Disposition: A | Discharge status: Home / Self Care | Payer: Medicaid Other | Attending: Emergency Medicine | Admitting: Emergency Medicine

## 2021-01-25 ENCOUNTER — Encounter (HOSPITAL_COMMUNITY): Payer: Self-pay | Admitting: Emergency Medicine

## 2021-01-25 DIAGNOSIS — Z79899 Other long term (current) drug therapy: Secondary | ICD-10-CM | POA: Diagnosis not present

## 2021-01-25 DIAGNOSIS — Z720 Tobacco use: Secondary | ICD-10-CM

## 2021-01-25 DIAGNOSIS — R0602 Shortness of breath: Secondary | ICD-10-CM | POA: Diagnosis present

## 2021-01-25 DIAGNOSIS — F17210 Nicotine dependence, cigarettes, uncomplicated: Secondary | ICD-10-CM | POA: Insufficient documentation

## 2021-01-25 DIAGNOSIS — J4521 Mild intermittent asthma with (acute) exacerbation: Secondary | ICD-10-CM | POA: Diagnosis not present

## 2021-01-25 DIAGNOSIS — Z21 Asymptomatic human immunodeficiency virus [HIV] infection status: Secondary | ICD-10-CM | POA: Diagnosis not present

## 2021-01-25 LAB — BASIC METABOLIC PANEL
Anion gap: 10 (ref 5–15)
BUN: 15 mg/dL (ref 6–20)
CO2: 22 mmol/L (ref 22–32)
Calcium: 8.6 mg/dL — ABNORMAL LOW (ref 8.9–10.3)
Chloride: 106 mmol/L (ref 98–111)
Creatinine, Ser: 0.92 mg/dL (ref 0.61–1.24)
GFR, Estimated: >60 mL/min (ref >60)
Glucose, Bld: 93 mg/dL (ref 70–99)
Potassium: 4 mmol/L (ref 3.5–5.1)
Sodium: 138 mmol/L (ref 135–145)

## 2021-01-25 LAB — CBC WITH DIFFERENTIAL/PLATELET
Abs Immature Granulocytes: 0.01 10*3/uL (ref 0.00–0.07)
Basophils Absolute: 0 10*3/uL (ref 0.0–0.1)
Basophils Relative: 0 %
Eosinophils Absolute: 0.3 10*3/uL (ref 0.0–0.5)
Eosinophils Relative: 5 %
HCT: 41.4 % (ref 39.0–52.0)
Hemoglobin: 13.8 g/dL (ref 13.0–17.0)
Immature Granulocytes: 0 %
Lymphocytes Relative: 54 %
Lymphs Abs: 2.9 10*3/uL (ref 0.7–4.0)
MCH: 32.4 pg (ref 26.0–34.0)
MCHC: 33.3 g/dL (ref 30.0–36.0)
MCV: 97.2 fL (ref 80.0–100.0)
Monocytes Absolute: 0.6 10*3/uL (ref 0.1–1.0)
Monocytes Relative: 11 %
Neutro Abs: 1.6 10*3/uL — ABNORMAL LOW (ref 1.7–7.7)
Neutrophils Relative %: 30 %
Platelets: 373 10*3/uL (ref 150–400)
RBC: 4.26 MIL/uL (ref 4.22–5.81)
RDW: 12.8 % (ref 11.5–15.5)
WBC: 5.4 10*3/uL (ref 4.0–10.5)
nRBC: 0 % (ref 0.0–0.2)

## 2021-01-25 LAB — BRAIN NATRIURETIC PEPTIDE: B Natriuretic Peptide: 31.2 pg/mL (ref 0.0–100.0)

## 2021-01-25 MED ORDER — PREDNISONE 50 MG PO TABS: 50.0000 mg | ORAL_TABLET | Freq: Every day | ORAL | 0 refills | Status: AC

## 2021-01-25 MED ORDER — SODIUM CHLORIDE 0.9 % IV BOLUS
1000.0000 mL | Freq: Once | INTRAVENOUS | Status: AC
  Administered 2021-01-25: 1000 mL via INTRAVENOUS

## 2021-01-25 MED ORDER — MAGNESIUM SULFATE 2 GM/50ML IV SOLN
2.0000 g | Freq: Once | INTRAVENOUS | Status: AC
  Administered 2021-01-25: 2 g via INTRAVENOUS
  Filled 2021-01-25: qty 50

## 2021-01-25 MED ORDER — ALBUTEROL SULFATE HFA 108 (90 BASE) MCG/ACT IN AERS
1.0000 | INHALATION_SPRAY | RESPIRATORY_TRACT | Status: DC | PRN
  Administered 2021-01-25: 1 via RESPIRATORY_TRACT
  Filled 2021-01-25: qty 6.7

## 2021-01-25 MED ORDER — ALBUTEROL SULFATE HFA 108 (90 BASE) MCG/ACT IN AERS: 1.0000 | INHALATION_SPRAY | Freq: Four times a day (QID) | RESPIRATORY_TRACT | 2 refills | Status: DC | PRN

## 2021-01-25 MED ORDER — IPRATROPIUM-ALBUTEROL 0.5-2.5 (3) MG/3ML IN SOLN
3.0000 mL | RESPIRATORY_TRACT | Status: AC
  Administered 2021-01-25 (×2): 3 mL via RESPIRATORY_TRACT
  Filled 2021-01-25 (×2): qty 3
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
## 2021-01-27 ENCOUNTER — Ambulatory Visit (HOSPITAL_COMMUNITY): Payer: Medicaid Other | Admitting: Family

## 2021-01-27 ENCOUNTER — Encounter (HOSPITAL_COMMUNITY): Payer: Self-pay | Admitting: Family

## 2021-01-27 DIAGNOSIS — F5105 Insomnia due to other mental disorder: Secondary | ICD-10-CM

## 2021-01-27 DIAGNOSIS — F411 Generalized anxiety disorder: Secondary | ICD-10-CM

## 2021-01-27 DIAGNOSIS — F3164 Bipolar disorder, current episode mixed, severe, with psychotic features: Secondary | ICD-10-CM

## 2021-01-27 DIAGNOSIS — F4310 Post-traumatic stress disorder, unspecified: Secondary | ICD-10-CM

## 2021-01-27 MED ORDER — TRAZODONE HCL 100 MG PO TABS: 100.0000 mg | ORAL_TABLET | Freq: Every evening | ORAL | 1 refills | Status: DC | PRN

## 2021-01-27 MED ORDER — QUETIAPINE FUMARATE 100 MG PO TABS: 100.0000 mg | ORAL_TABLET | Freq: Every day | ORAL | 1 refills | Status: DC

## 2021-01-27 MED ORDER — BUSPIRONE HCL 7.5 MG PO TABS: 7.5000 mg | ORAL_TABLET | Freq: Two times a day (BID) | ORAL | 1 refills | Status: DC

## 2021-01-27 MED ORDER — MIRTAZAPINE 15 MG PO TABS: 15.0000 mg | ORAL_TABLET | Freq: Every day | ORAL | 1 refills | Status: DC

## 2021-01-27 NOTE — Progress Notes (Signed)
[facility] MD/PA/NP OP Progress Note  01/27/2021 9:45 AM John [name]  MRN:  [id number]  Chief Complaint: John [name] " I  am doing alright." Chief Complaint   Medication Management     HPI: John [name] was seen and evaluated face-to-face.  Patient has a charted diagnosis of bipolar disorder, depression, PTSD and adjustment disorder.  He presents for medication refills.  He is currently prescribed Remeron 15 mg p.o. nightly, BuSpar 7.5 mg p.o. twice daily and Seroquel 100 mg p.o. nightly.  He reports taking and tolerating medications well.  Denied any medication side effects.  Currently denying suicidal or homicidal ideations.  Denies auditory or visual hallucinations.    Meet is very minimal during this assessment.  Denied any current stressors.  Rating his depression and anxiety 2 out of 10 with 10 being the worst. "  Everything is everything" denied that he is currently employed. "  Said at home wait for the mailman to collect my check."  Reports a good appetite.  States he is resting well throughout the night.  Denied any illicit drug use or substance abuse recently. we will make medication available.  Patient to follow-up 8 weeks.  Support, and encouragement reassurance was provided.  Visit Diagnosis:    ICD-10-CM   1. Generalized anxiety disorder  F41.1 busPIRone (BUSPAR) 7.5 MG tablet    mirtazapine (REMERON) 15 MG tablet    2. Insomnia due to other mental disorder  F51.05 mirtazapine (REMERON) 15 MG tablet   F99 QUEtiapine (SEROQUEL) 100 MG tablet    traZODone (DESYREL) 100 MG tablet    3. PTSD (post-traumatic stress disorder)  F43.10 mirtazapine (REMERON) 15 MG tablet    4. Bipolar disorder, curr episode mixed, severe, with psychotic features ([place])  F31.64 mirtazapine (REMERON) 15 MG tablet    QUEtiapine (SEROQUEL) 100 MG tablet      Past Psychiatric History:  Past Medical History:  Past Medical History:  Diagnosis Date   Alcohol abuse    Alcoholism ([place])     Anxiety    Asthma    Asthma    Bipolar disorder ([place])    Chronic low back pain    Cocaine abuse ([place])    Depression    Gout    Gout    HIV (human immunodeficiency virus infection) ([place])    "dx'd ~ 2 yr ago" (09/29/2012)   HIV (human immunodeficiency virus infection) ([place])    HIV disease ([place])    Homelessness    Hypertension    Marijuana abuse    Mental disorder    Schizophrenia ([place])     Past Surgical History:  Procedure Laterality Date   SKIN GRAFT FULL THICKNESS LEG Left ?   POSTERIOR LEFT LEG  AFTER DOG BITES    Family Psychiatric History:   Family History:  Family History  Problem Relation Age of Onset   Alcoholism Mother    Depression Mother    Alcoholism Brother     Social History:  Social History   Socioeconomic History   Marital status: Single    Spouse name: Not on file   Number of children: 0   Years of education: Not on file   Highest education level: 9th grade  Occupational History   Occupation: unemployed  Tobacco Use   Smoking status: Every Day    Packs/day: 0.50    Years: 27.00    Pack years: 13.50    Types: Cigarettes   Smokeless tobacco: Never  Vaping Use   Vaping Use: Never  used  Substance and Sexual Activity   Alcohol use: Yes    Alcohol/week: 12.0 standard drinks    Types: 12 Cans of beer per week    Comment: Daily. "As much as I can get."   Drug use: Yes    Types: Marijuana, "Crack" cocaine    Comment: States been drinking 4-40oz beers daily.   Sexual activity: Not Currently    Partners: Male    Birth control/protection: Condom    Comment: given condoms 04/10/20  Other Topics Concern   Not on file  Social History Narrative   ** Merged History Encounter **       ** Merged History Encounter **       Social Determinants of Health   Financial Resource Strain: High Risk   Difficulty of Paying Living Expenses: Very hard  Food Insecurity: Landscape architect Present   Worried About Charity fundraiser in the Last Year: Sometimes  true   Arboriculturist in the Last Year: Sometimes true  Transportation Needs: Public librarian (Medical): Yes   Lack of Transportation (Non-Medical): Yes  Physical Activity: Inactive   Days of Exercise per Week: 0 days   Minutes of Exercise per Session: 0 min  Stress: Stress Concern Present   Feeling of Stress : Rather much  Social Connections: Moderately Isolated   Frequency of Communication with Friends and Family: Three times a week   Frequency of Social Gatherings with Friends and Family: Three times a week   Attends Religious Services: Never   Active Member of Clubs or Organizations: Yes   Attends Archivist Meetings: More than 4 times per year   Marital Status: Never married    Allergies:  Allergies  Allergen Reactions   Shellfish Allergy Anaphylaxis and Swelling    Metabolic Disorder Labs: Lab Results  Component Value Date   HGBA1C 5.2 12/23/2020   MPG 102.54 12/23/2020   MPG 102.54 09/18/2020   Lab Results  Component Value Date   PROLACTIN 6.7 09/29/2012   Lab Results  Component Value Date   CHOL 194 09/18/2020   TRIG 86 09/18/2020   HDL 83 09/18/2020   CHOLHDL 2.3 09/18/2020   VLDL 17 09/18/2020   LDLCALC 94 09/18/2020   LDLCALC 60 04/10/2020   Lab Results  Component Value Date   TSH 5.149 (H) 09/18/2020   TSH 1.263 03/24/2019    Therapeutic Level Labs: No results found for: LITHIUM Lab Results  Component Value Date   VALPROATE 59 04/06/2015   VALPROATE 95 11/18/2014   No components found for:  CBMZ  Current Medications: Current Outpatient Medications  Medication Sig Dispense Refill   albuterol (VENTOLIN HFA) 108 (90 Base) MCG/ACT inhaler Inhale 2 puffs into the lungs every 4 (four) hours as needed for wheezing or shortness of breath. 18 g 0   albuterol (VENTOLIN HFA) 108 (90 Base) MCG/ACT inhaler Inhale 1-2 puffs into the lungs every 6 (six) hours as needed for wheezing or shortness of breath. 6.7  g 2   busPIRone (BUSPAR) 7.5 MG tablet Take 1 tablet (7.5 mg total) by mouth 2 (two) times daily. 60 tablet 1   mirtazapine (REMERON) 15 MG tablet Take 1 tablet (15 mg total) by mouth at bedtime. 30 tablet 1   predniSONE (DELTASONE) 50 MG tablet Take 1 tablet (50 mg total) by mouth daily. 5 tablet 0   predniSONE (DELTASONE) 50 MG tablet Take 1 tablet (50 mg total) by mouth daily  for 7 days. 7 tablet 0   QUEtiapine (SEROQUEL) 100 MG tablet Take 1 tablet (100 mg total) by mouth at bedtime. 30 tablet 1   SYMTUZA 800-150-200-10 MG TABS Take 1 tablet by mouth every morning. 30 tablet 0   traZODone (DESYREL) 100 MG tablet Take 1 tablet (100 mg total) by mouth at bedtime as needed for sleep. 30 tablet 1   No current facility-administered medications for this visit.     Musculoskeletal: Strength & Muscle Tone: within normal limits Gait & Station: normal Patient leans: N/A  Psychiatric Specialty Exam: Review of Systems  Blood pressure 114/74, pulse 83, height 5\' 7"  (1.702 m), weight 147 lb (66.7 kg).Body mass index is 23.02 kg/m.  General Appearance: Casual  Eye Contact:  Good  Speech:  Clear and Coherent  Volume:  Normal  Mood:  Anxious and Depressed  Affect:  Congruent  Thought Process:  Coherent  Orientation:  Full (Time, Place, and Person)  Thought Content: Logical   Suicidal Thoughts:  No  Homicidal Thoughts:  No  Memory:  Immediate;   Fair Recent;   Fair  Judgement:  Good  Insight:  Good  Psychomotor Activity:  Normal  Concentration:  Concentration: Good  Recall:  Good  Fund of Knowledge: Good  Language: Good  Akathisia:  No  Handed:  Right  AIMS (if indicated): done  Assets:  Communication Skills Desire for Improvement Resilience Social Support  ADL's:  Intact  Cognition: WNL  Sleep:  Good   Screenings: AIMS    Flowsheet Row Admission (Discharged) from 09/17/2020 in [facility] Admission (Discharged) from 03/23/2019 in [facility] Admission (Discharged) from 03/31/2015 in [facility] Admission (Discharged) from 11/14/2014 in [facility] Total Score 0 0 0 0      AUDIT    Flowsheet Row Admission (Discharged) from 09/17/2020 in [facility] Counselor from 05/22/2020 in [facility] Admission (Discharged) from 03/23/2019 in [facility] Admission (Discharged) from 03/31/2015 in [facility] Admission (Discharged) from 11/14/2014 in [facility]  Alcohol Use Disorder Identification Test Final Score (AUDIT) 31 32 28 12 12       GAD-7    Flowsheet Row Office Visit from 11/24/2020 in [facility] Office Visit from 10/03/2020 in [facility] Office Visit from 08/01/2020 in [facility] Office Visit from 05/22/2020 in [facility]  Total GAD-7 Score 16 18 9 19       PHQ2-9    [facility] Office Visit from 11/24/2020 in [facility] Office Visit from 10/27/2020 in [facility] Office Visit from 10/03/2020 in [facility] Office Visit from 08/07/2020 in [facility] Office Visit from 08/01/2020 in [facility]  PHQ-2 Total Score 5 0 4 0 2  PHQ-9 Total Score 18 -- 18 -- [facility] from 12/31/2020 in [facility] ED from 12/23/2020 in [facility] ED from 12/22/2020 in [facility] DEPT  C-SSRS RISK CATEGORY High Risk High Risk High Risk        Assessment and Plan: Agam Davenport is a 50 year old African-American male that presents  for medication management.  Charted history of bipolar disorder, generalized anxiety disorder and major depression disorder.  Reports his mood is stable and denied any concerns at this visit.  States he has been medication compliant.  States he is keeping up with all outpatient follow-up providers.  Denied history of recent substance abuse use.  Patient to follow-up in 8 weeks.  Major depressive disorder Adjustment disorder Posttraumatic stress disorder Generalized anxiety disorder  Continue BuSpar 7.5 mg p.o. twice daily Continue Seroquel 100 mg p.o. nightly Continue trazodone 100 mg p.o. nightly as needed Continue Remeron 15 mg p.o. nightly  Patient to follow-up 8 weeks for medication management  Derrill [place], NP 01/27/2021, 9:45 AM

## 2021-02-10 ENCOUNTER — Ambulatory Visit (INDEPENDENT_AMBULATORY_CARE_PROVIDER_SITE_OTHER): Payer: Medicaid Other | Admitting: Family

## 2021-02-10 ENCOUNTER — Encounter: Payer: Self-pay | Admitting: Family

## 2021-02-10 ENCOUNTER — Other Ambulatory Visit: Payer: Self-pay

## 2021-02-10 VITALS — BP 100/68 | HR 81 | Temp 98.3°F | Wt 146.6 lb

## 2021-02-10 DIAGNOSIS — Z113 Encounter for screening for infections with a predominantly sexual mode of transmission: Secondary | ICD-10-CM

## 2021-02-10 DIAGNOSIS — Z0000 Encounter for general adult medical examination without abnormal findings: Secondary | ICD-10-CM | POA: Diagnosis not present

## 2021-02-10 DIAGNOSIS — B20 Human immunodeficiency virus [HIV] disease: Secondary | ICD-10-CM

## 2021-02-10 MED ORDER — SYMTUZA 800-150-200-10 MG PO TABS: 1.0000 | ORAL_TABLET | Freq: Every morning | ORAL | 5 refills | Status: DC

## 2021-02-10 NOTE — Assessment & Plan Note (Signed)
John [name] continues to have well-controlled virus with good adherence and tolerance to his ART regimen of Symtuza.  No signs/symptoms of opportunistic infection.  We reviewed previous lab work and discussed plan of care.  Continue current dose of Symtuza.  Check lab work today.  Plan for follow-up in 3 months or sooner if needed with lab work on the same day.

## 2021-02-10 NOTE — Patient Instructions (Signed)
Nice to see you.  We will check your lab work today.  Continue to take your medication daily as prescribed.  Refills have been sent to the pharmacy.  Plan for follow up in 3 months or sooner if needed with lab work on the same day.   Recommend flu vaccine at your convenience.   Have a great day and stay safe!
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
## 2021-02-10 NOTE — Assessment & Plan Note (Signed)
   Discussed importance of safe sexual practice and condom use.  Condoms offered.  Declines flu vaccine and will consider getting independently.

## 2021-02-11 LAB — T-HELPER CELL (CD4) - (RCID CLINIC ONLY)
CD4 % Helper T Cell: 40 % (ref 33–65)
CD4 T Cell Abs: 661 /uL (ref 400–1790)

## 2021-02-12 LAB — RPR: RPR Ser Ql: NONREACTIVE

## 2021-02-12 LAB — HIV-1 RNA QUANT-NO REFLEX-BLD
HIV 1 RNA Quant: NOT DETECTED Copies/mL
HIV-1 RNA Quant, Log: NOT DETECTED Log cps/mL

## 2021-03-09 ENCOUNTER — Emergency Department (HOSPITAL_COMMUNITY)
Admission: EM | Admit: 2021-03-09 | Discharge: 2021-03-10 | Disposition: A | Discharge status: Home / Self Care | Payer: Medicaid Other | Attending: Emergency Medicine | Admitting: Emergency Medicine

## 2021-03-09 ENCOUNTER — Other Ambulatory Visit: Payer: Self-pay

## 2021-03-09 ENCOUNTER — Ambulatory Visit (HOSPITAL_COMMUNITY)
Admission: EM | Admit: 2021-03-09 | Discharge: 2021-03-09 | Disposition: A | Discharge status: Other Acute Inpatient Hospital | Payer: Medicaid Other | Attending: Registered Nurse | Admitting: Registered Nurse

## 2021-03-09 DIAGNOSIS — F430 Acute stress reaction: Secondary | ICD-10-CM

## 2021-03-09 DIAGNOSIS — Z21 Asymptomatic human immunodeficiency virus [HIV] infection status: Secondary | ICD-10-CM | POA: Insufficient documentation

## 2021-03-09 DIAGNOSIS — Z20822 Contact with and (suspected) exposure to covid-19: Secondary | ICD-10-CM | POA: Diagnosis not present

## 2021-03-09 DIAGNOSIS — J45909 Unspecified asthma, uncomplicated: Secondary | ICD-10-CM | POA: Diagnosis not present

## 2021-03-09 DIAGNOSIS — F1290 Cannabis use, unspecified, uncomplicated: Secondary | ICD-10-CM | POA: Insufficient documentation

## 2021-03-09 DIAGNOSIS — I10 Essential (primary) hypertension: Secondary | ICD-10-CM | POA: Diagnosis not present

## 2021-03-09 DIAGNOSIS — R0602 Shortness of breath: Secondary | ICD-10-CM | POA: Insufficient documentation

## 2021-03-09 DIAGNOSIS — F29 Unspecified psychosis not due to a substance or known physiological condition: Secondary | ICD-10-CM | POA: Insufficient documentation

## 2021-03-09 DIAGNOSIS — F17210 Nicotine dependence, cigarettes, uncomplicated: Secondary | ICD-10-CM | POA: Diagnosis not present

## 2021-03-09 DIAGNOSIS — R45851 Suicidal ideations: Secondary | ICD-10-CM | POA: Diagnosis not present

## 2021-03-09 DIAGNOSIS — F32A Depression, unspecified: Secondary | ICD-10-CM | POA: Diagnosis present

## 2021-03-09 DIAGNOSIS — F3130 Bipolar disorder, current episode depressed, mild or moderate severity, unspecified: Secondary | ICD-10-CM | POA: Diagnosis not present

## 2021-03-09 DIAGNOSIS — F4321 Adjustment disorder with depressed mood: Secondary | ICD-10-CM

## 2021-03-09 DIAGNOSIS — F1020 Alcohol dependence, uncomplicated: Secondary | ICD-10-CM

## 2021-03-09 DIAGNOSIS — F1410 Cocaine abuse, uncomplicated: Secondary | ICD-10-CM

## 2021-03-09 DIAGNOSIS — F1994 Other psychoactive substance use, unspecified with psychoactive substance-induced mood disorder: Secondary | ICD-10-CM

## 2021-03-09 LAB — RESP PANEL BY RT-PCR (FLU A&B, COVID) ARPGX2
Influenza A by PCR: NEGATIVE
Influenza B by PCR: NEGATIVE
SARS Coronavirus 2 by RT PCR: NEGATIVE

## 2021-03-09 LAB — CBC WITH DIFFERENTIAL/PLATELET
Abs Immature Granulocytes: 0.02 10*3/uL (ref 0.00–0.07)
Basophils Absolute: 0 10*3/uL (ref 0.0–0.1)
Basophils Relative: 1 %
Eosinophils Absolute: 0.3 10*3/uL (ref 0.0–0.5)
Eosinophils Relative: 6 %
HCT: 41 % (ref 39.0–52.0)
Hemoglobin: 13.3 g/dL (ref 13.0–17.0)
Immature Granulocytes: 0 %
Lymphocytes Relative: 28 %
Lymphs Abs: 1.6 10*3/uL (ref 0.7–4.0)
MCH: 31.7 pg (ref 26.0–34.0)
MCHC: 32.4 g/dL (ref 30.0–36.0)
MCV: 97.9 fL (ref 80.0–100.0)
Monocytes Absolute: 0.6 10*3/uL (ref 0.1–1.0)
Monocytes Relative: 10 %
Neutro Abs: 3.3 10*3/uL (ref 1.7–7.7)
Neutrophils Relative %: 55 %
Platelets: 423 10*3/uL — ABNORMAL HIGH (ref 150–400)
RBC: 4.19 MIL/uL — ABNORMAL LOW (ref 4.22–5.81)
RDW: 12.4 % (ref 11.5–15.5)
WBC: 5.9 10*3/uL (ref 4.0–10.5)
nRBC: 0 % (ref 0.0–0.2)

## 2021-03-09 LAB — COMPREHENSIVE METABOLIC PANEL
ALT: 14 U/L (ref 0–44)
AST: 21 U/L (ref 15–41)
Albumin: 3.6 g/dL (ref 3.5–5.0)
Alkaline Phosphatase: 85 U/L (ref 38–126)
Anion gap: 8 (ref 5–15)
BUN: 23 mg/dL — ABNORMAL HIGH (ref 6–20)
CO2: 24 mmol/L (ref 22–32)
Calcium: 8.6 mg/dL — ABNORMAL LOW (ref 8.9–10.3)
Chloride: 107 mmol/L (ref 98–111)
Creatinine, Ser: 1.2 mg/dL (ref 0.61–1.24)
GFR, Estimated: >60 mL/min (ref >60)
Glucose, Bld: 96 mg/dL (ref 70–99)
Potassium: 4.2 mmol/L (ref 3.5–5.1)
Sodium: 139 mmol/L (ref 135–145)
Total Bilirubin: 0.5 mg/dL (ref 0.3–1.2)
Total Protein: 7.2 g/dL (ref 6.5–8.1)

## 2021-03-09 LAB — SALICYLATE LEVEL: Salicylate Lvl: <7 mg/dL — ABNORMAL LOW (ref 7.0–30.0)

## 2021-03-09 LAB — RAPID URINE DRUG SCREEN, HOSP PERFORMED
Amphetamines: NOT DETECTED
Barbiturates: NOT DETECTED
Benzodiazepines: NOT DETECTED
Cocaine: POSITIVE — AB
Opiates: NOT DETECTED
Tetrahydrocannabinol: POSITIVE — AB

## 2021-03-09 LAB — ETHANOL: Alcohol, Ethyl (B): <10 mg/dL (ref <10)

## 2021-03-09 LAB — ACETAMINOPHEN LEVEL: Acetaminophen (Tylenol), Serum: <10 ug/mL — ABNORMAL LOW (ref 10–30)

## 2021-03-09 MED ORDER — LORAZEPAM 1 MG PO TABS
2.0000 mg | ORAL_TABLET | Freq: Once | ORAL | Status: AC
  Administered 2021-03-09: 08:00:00 2 mg via ORAL
  Filled 2021-03-09: qty 2

## 2021-03-09 MED ORDER — LORAZEPAM 1 MG PO TABS: 1.0000 mg | ORAL_TABLET | Freq: Once | ORAL | Status: DC

## 2021-03-09 MED ORDER — MIRTAZAPINE 7.5 MG PO TABS: 15.0000 mg | ORAL_TABLET | Freq: Every day | ORAL | Status: DC

## 2021-03-09 MED ORDER — LORAZEPAM 1 MG PO TABS: 2.0000 mg | ORAL_TABLET | Freq: Once | ORAL | Status: DC

## 2021-03-09 MED ORDER — NICOTINE 21 MG/24HR TD PT24: 21.0000 mg | MEDICATED_PATCH | Freq: Every day | TRANSDERMAL | Status: DC

## 2021-03-09 MED ORDER — ACETAMINOPHEN 325 MG PO TABS: 650.0000 mg | ORAL_TABLET | ORAL | Status: DC | PRN

## 2021-03-09 MED ORDER — ONDANSETRON HCL 4 MG PO TABS: 4.0000 mg | ORAL_TABLET | Freq: Three times a day (TID) | ORAL | Status: DC | PRN

## 2021-03-09 MED ORDER — QUETIAPINE FUMARATE 100 MG PO TABS: 100.0000 mg | ORAL_TABLET | Freq: Every day | ORAL | Status: DC

## 2021-03-09 MED ORDER — BUSPIRONE HCL 5 MG PO TABS
7.5000 mg | ORAL_TABLET | Freq: Two times a day (BID) | ORAL | Status: DC
  Filled 2021-03-09: qty 1.5

## 2021-03-09 NOTE — Consult Note (Signed)
[facility] Consult   Reason for Consult:  Suicidal ideation Referring Physician:  Lacretia Leigh Patient Identification: John [name] MRN:  [id number] Principal Diagnosis: <principal problem not specified> Diagnosis:  Active Problems:   Suicidal ideation   Total Time spent with patient: 30 minutes  Subjective:   John [name] is a 50 y.o. male patient admitted with suicidal ideation with a plan to jump from a bridge. Patient was seen, chart reviewed and case discussed with Dr Lovette Cliche. Patient was admitted to [facility]  HPI:  Patient is a 50 year old male who presented voluntarily to [employer] [facility] [employer] with SI and a plan to jump off a bridge. He denies HI/AVH, paranoia and delusions. He is calm and cooperative. His appearance is disheveled, he maintains fair eye contact. He is sitting on the stretcher, eating his lunch. He stated he has been off his medications for 1.5 months and had no money to get them. Patient goes to [facility] for medication management, last documented visit for refills was 01/27/2021. Today patient stated he has been living with a friend and they had a huge argument. He declined to say what the argument was about. Per notes in chart, [employer] was called and were arresting the patient when he voiced SI. He was the transported to [facility] for evaluation. Patient stated he feels hopeless and helpless. He stated he did not get his medications refilled because of lack of transportation. Reminded patient he can ride the city bus to [facility]. Patient denied substance use, however his UDS is positive for cocaine and THC. Patient has a history of presenting with the same symptoms and requesting help with medications and substance use issues. He is agreeable to being restarted on his home medications and being transported to [facility] for over night observation.   Past Psychiatric History: Past psychiatric history significant for bipolar disorder with psychotic features, MDD recurrent severe with  psychosis, GAD, PTSD, insomnia, alcohol use disorder/dependence, cocaine use disorder/dependence, cannabis use disorder/dependence, substance-induced mood disorder. [facility] [place] admission from 11/19/20 - 11/24/20, [facility] admission from 09/17/20 - 09/23/20.  Patient has been psychiatrically hospitalized at [facility] and [facility] on multiple other occasions in the past as well.    Risk to Self:  Yes, minimal Risk to Others:  No Prior Inpatient Therapy:  Yes Prior Outpatient Therapy:  Yes  Past Medical History:  Past Medical History:  Diagnosis Date   Alcohol abuse    Alcoholism ([place])    Anxiety    Asthma    Asthma    Bipolar disorder ([place])    Chronic low back pain    Cocaine abuse ([place])    Depression    Gout    Gout    HIV (human immunodeficiency virus infection) ([place])    "dx'd ~ 2 yr ago" (09/29/2012)   HIV (human immunodeficiency virus infection) ([place])    HIV disease ([place])    Homelessness    Hypertension    Marijuana abuse    Mental disorder    Schizophrenia ([place])     Past Surgical History:  Procedure Laterality Date   SKIN GRAFT FULL THICKNESS LEG Left ?   POSTERIOR LEFT LEG  AFTER DOG BITES   Family History:  Family History  Problem Relation Age of Onset   Alcoholism Mother    Depression Mother    Alcoholism Brother    Family Psychiatric  History: No family hx reported Social History:  Social History   Substance and Sexual Activity  Alcohol Use Yes   Alcohol/week: 12.0  standard drinks   Types: 12 Cans of beer per week   Comment: Daily. "As much as I can get."     Social History   Substance and Sexual Activity  Drug Use Yes   Types: Marijuana, "Crack" cocaine   Comment: States been drinking 4-40oz beers daily.    Social History   Socioeconomic History   Marital status: Single    Spouse name: Not on file   Number of children: 0   Years of education: Not on file   Highest education level: 9th grade  Occupational History   Occupation: unemployed  Tobacco Use   Smoking  status: Every Day    Packs/day: 0.50    Years: 27.00    Pack years: 13.50    Types: Cigarettes   Smokeless tobacco: Never  Vaping Use   Vaping Use: Never used  Substance and Sexual Activity   Alcohol use: Yes    Alcohol/week: 12.0 standard drinks    Types: 12 Cans of beer per week    Comment: Daily. "As much as I can get."   Drug use: Yes    Types: Marijuana, "Crack" cocaine    Comment: States been drinking 4-40oz beers daily.   Sexual activity: Not Currently    Partners: Male    Birth control/protection: Condom    Comment: given condoms 01/2021  Other Topics Concern   Not on file  Social History Narrative   ** Merged History Encounter **       ** Merged History Encounter **       Social Determinants of Health   Financial Resource Strain: High Risk   Difficulty of Paying Living Expenses: Very hard  Food Insecurity: Landscape architect Present   Worried About Charity fundraiser in the Last Year: Sometimes true   Arboriculturist in the Last Year: Sometimes true  Transportation Needs: Public librarian (Medical): Yes   Lack of Transportation (Non-Medical): Yes  Physical Activity: Inactive   Days of Exercise per Week: 0 days   Minutes of Exercise per Session: 0 min  Stress: Stress Concern Present   Feeling of Stress : Rather much  Social Connections: Moderately Isolated   Frequency of Communication with Friends and Family: Three times a week   Frequency of Social Gatherings with Friends and Family: Three times a week   Attends Religious Services: Never   Active Member of Clubs or Organizations: Yes   Attends Music therapist: More than 4 times per year   Marital Status: Never married   Additional Social History:    Allergies:   Allergies  Allergen Reactions   Shellfish Allergy Anaphylaxis and Swelling    Labs:  Results for orders placed or performed during the hospital encounter of 03/09/21 (from the past 48 hour(s))   Ethanol     Status: None   Collection Time: 03/09/21  8:02 AM  Result Value Ref Range   Alcohol, Ethyl (B) <10 <10 mg/dL    Comment: (NOTE) Lowest detectable limit for serum alcohol is 10 mg/dL.  For medical purposes only. Performed at [facility], [place] [address] [place], [place] [address]   Salicylate level     Status: Abnormal   Collection Time: 03/09/21  8:02 AM  Result Value Ref Range   Salicylate Lvl <9.8 (L) 7.0 - 30.0 mg/dL    Comment: Performed at [facility], [place] [address] [place], [place] [address]  Acetaminophen level  Status: Abnormal   Collection Time: 03/09/21  8:02 AM  Result Value Ref Range   Acetaminophen (Tylenol), Serum <10 (L) 10 - 30 ug/mL    Comment: (NOTE) Therapeutic concentrations vary significantly. A range of 10-30 ug/mL  may be an effective concentration for many patients. However, some  are best treated at concentrations outside of this range. Acetaminophen concentrations >150 ug/mL at 4 hours after ingestion  and >50 ug/mL at 12 hours after ingestion are often associated with  toxic reactions.  Performed at [facility], [place] [address] [place], [place] [address]   CBC with Differential/Platelet     Status: Abnormal   Collection Time: 03/09/21  8:02 AM  Result Value Ref Range   WBC 5.9 4.0 - 10.5 K/uL   RBC 4.19 (L) 4.22 - 5.81 MIL/uL   Hemoglobin 13.3 13.0 - 17.0 g/dL   HCT 41.0 39.0 - 52.0 %   MCV 97.9 80.0 - 100.0 fL   MCH 31.7 26.0 - 34.0 pg   MCHC 32.4 30.0 - 36.0 g/dL   RDW 12.4 11.5 - 15.5 %   Platelets 423 (H) 150 - 400 K/uL   nRBC 0.0 0.0 - 0.2 %   Neutrophils Relative % 55 %   Neutro Abs 3.3 1.7 - 7.7 K/uL   Lymphocytes Relative 28 %   Lymphs Abs 1.6 0.7 - 4.0 K/uL   Monocytes Relative 10 %   Monocytes Absolute 0.6 0.1 - 1.0 K/uL   Eosinophils Relative 6 %   Eosinophils Absolute 0.3 0.0 - 0.5 K/uL   Basophils Relative 1 %   Basophils Absolute 0.0 0.0 - 0.1  K/uL   Immature Granulocytes 0 %   Abs Immature Granulocytes 0.02 0.00 - 0.07 K/uL    Comment: Performed at [facility], [place] [address], [place], [place] [address]  Comprehensive metabolic panel     Status: Abnormal   Collection Time: 03/09/21  8:02 AM  Result Value Ref Range   Sodium 139 135 - 145 mmol/L   Potassium 4.2 3.5 - 5.1 mmol/L   Chloride 107 98 - 111 mmol/L   CO2 24 22 - 32 mmol/L   Glucose, Bld 96 70 - 99 mg/dL    Comment: Glucose reference range applies only to samples taken after fasting for at least 8 hours.   BUN 23 (H) 6 - 20 mg/dL   Creatinine, Ser 1.20 0.61 - 1.24 mg/dL   Calcium 8.6 (L) 8.9 - 10.3 mg/dL   Total Protein 7.2 6.5 - 8.1 g/dL   Albumin 3.6 3.5 - 5.0 g/dL   AST 21 15 - 41 U/L   ALT 14 0 - 44 U/L   Alkaline Phosphatase 85 38 - 126 U/L   Total Bilirubin 0.5 0.3 - 1.2 mg/dL   GFR, Estimated >60 >60 mL/min    Comment: (NOTE) Calculated using the CKD-EPI Creatinine Equation (2021)    Anion gap 8 5 - 15    Comment: Performed at [facility], [place] [address] [place], [place] [address]  Rapid urine drug screen (hospital performed)     Status: Abnormal   Collection Time: 03/09/21  8:03 AM  Result Value Ref Range   Opiates NONE DETECTED NONE DETECTED   Cocaine POSITIVE (A) NONE DETECTED   Benzodiazepines NONE DETECTED NONE DETECTED   Amphetamines NONE DETECTED NONE DETECTED   Tetrahydrocannabinol POSITIVE (A) NONE DETECTED   Barbiturates NONE DETECTED NONE DETECTED    Comment: (NOTE) DRUG SCREEN FOR MEDICAL PURPOSES  ONLY.  IF CONFIRMATION IS NEEDED FOR ANY PURPOSE, NOTIFY LAB WITHIN 5 DAYS.  LOWEST DETECTABLE LIMITS FOR URINE DRUG SCREEN Drug Class                     Cutoff (ng/mL) Amphetamine and metabolites    1000 Barbiturate and metabolites    200 Benzodiazepine                 893 Tricyclics and metabolites     300 Opiates and metabolites        300 Cocaine and metabolites        300 THC                             50 Performed at [facility], [place] [address], [place], [place] [address]   Resp Panel by RT-PCR (Flu A&B, Covid) Nasopharyngeal Swab     Status: None   Collection Time: 03/09/21  9:03 AM   Specimen: Nasopharyngeal Swab; Nasopharyngeal(NP) swabs in vial transport medium  Result Value Ref Range   SARS Coronavirus 2 by RT PCR NEGATIVE NEGATIVE    Comment: (NOTE) SARS-CoV-2 target nucleic acids are NOT DETECTED.  The SARS-CoV-2 RNA is generally detectable in upper respiratory specimens during the acute phase of infection. The lowest concentration of SARS-CoV-2 viral copies this assay can detect is 138 copies/mL. A negative result does not preclude SARS-Cov-2 infection and should not be used as the sole basis for treatment or other patient management decisions. A negative result may occur with  improper specimen collection/handling, submission of specimen other than nasopharyngeal swab, presence of viral mutation(s) within the areas targeted by this assay, and inadequate number of viral copies(<138 copies/mL). A negative result must be combined with clinical observations, patient history, and epidemiological information. The expected result is Negative.  Fact Sheet for Patients:  EntrepreneurPulse.com.au  Fact Sheet for Healthcare Providers:  IncredibleEmployment.be  This test is no t yet approved or cleared by the [place] FDA and  has been authorized for detection and/or diagnosis of SARS-CoV-2 by FDA under an Emergency Use Authorization (EUA). This EUA will remain  in effect (meaning this test can be used) for the duration of the COVID-19 declaration under Section 564(b)(1) of the Act, 21 U.S.C.section 360bbb-3(b)(1), unless the authorization is terminated  or revoked sooner.       Influenza A by PCR NEGATIVE NEGATIVE   Influenza B by PCR NEGATIVE NEGATIVE    Comment: (NOTE) The Xpert Xpress  SARS-CoV-2/FLU/RSV plus assay is intended as an aid in the diagnosis of influenza from Nasopharyngeal swab specimens and should not be used as a sole basis for treatment. Nasal washings and aspirates are unacceptable for Xpert Xpress SARS-CoV-2/FLU/RSV testing.  Fact Sheet for Patients: EntrepreneurPulse.com.au  Fact Sheet for Healthcare Providers: IncredibleEmployment.be  This test is not yet approved or cleared by the [place] FDA and has been authorized for detection and/or diagnosis of SARS-CoV-2 by FDA under an Emergency Use Authorization (EUA). This EUA will remain in effect (meaning this test can be used) for the duration of the COVID-19 declaration under Section 564(b)(1) of the Act, 21 U.S.C. section 360bbb-3(b)(1), unless the authorization is terminated or revoked.  Performed at [facility], [place] [address], [place], [place] [address]     Current Facility-Administered Medications  Medication Dose Route Frequency Provider Last Rate Last Admin   acetaminophen (TYLENOL) tablet 650 mg  650 mg  Oral Q4H PRN Lacretia Leigh, MD       busPIRone (BUSPAR) tablet 7.5 mg  7.5 mg Oral BID Ethelene Hal, NP       mirtazapine (REMERON) tablet 15 mg  15 mg Oral QHS Ethelene Hal, NP       nicotine (NICODERM CQ - dosed in mg/24 hours) patch 21 mg  21 mg Transdermal Daily Lacretia Leigh, MD       ondansetron [facility]) tablet 4 mg  4 mg Oral Q8H PRN Lacretia Leigh, MD       QUEtiapine (SEROQUEL) tablet 100 mg  100 mg Oral QHS Ethelene Hal, NP       Current Outpatient Medications  Medication Sig Dispense Refill   busPIRone (BUSPAR) 7.5 MG tablet Take 1 tablet (7.5 mg total) by mouth 2 (two) times daily. 60 tablet 1   mirtazapine (REMERON) 15 MG tablet Take 1 tablet (15 mg total) by mouth at bedtime. 30 tablet 1   QUEtiapine (SEROQUEL) 100 MG tablet Take 1 tablet (100 mg total) by mouth at bedtime. 30 tablet  1   SYMTUZA 800-150-200-10 MG TABS Take 1 tablet by mouth every morning. 30 tablet 5   traZODone (DESYREL) 100 MG tablet Take 100 mg by mouth at bedtime.     albuterol (VENTOLIN HFA) 108 (90 Base) MCG/ACT inhaler Inhale 2 puffs into the lungs every 4 (four) hours as needed for wheezing or shortness of breath. 18 g 0   albuterol (VENTOLIN HFA) 108 (90 Base) MCG/ACT inhaler Inhale 1-2 puffs into the lungs every 6 (six) hours as needed for wheezing or shortness of breath. 6.7 g 2   predniSONE (DELTASONE) 50 MG tablet Take 1 tablet (50 mg total) by mouth daily. (Patient not taking: Reported on 02/10/2021) 5 tablet 0    Musculoskeletal: Strength & Muscle Tone: within normal limits Gait & Station: normal Patient leans: N/A   Psychiatric Specialty Exam:  Presentation  General Appearance: Disheveled  Eye Contact:Fair  Speech:Clear and Coherent  Speech Volume:Normal  Handedness:Right   Mood and Affect  Mood:Depressed; Anxious; Irritable  Affect:Congruent; Depressed   Thought Process  Thought Processes:Coherent; Goal Directed  Descriptions of Associations:Intact  Orientation:Full (Time, Place and Person)  Thought Content:Logical  History of Schizophrenia/Schizoaffective disorder:No  Duration of Psychotic Symptoms:Less than six months  Hallucinations:Hallucinations: None (patient denies today)  Ideas of Reference:None (patient denies today)  Suicidal Thoughts:Suicidal Thoughts: Yes, Passive SI Passive Intent and/or Plan: Without Intent; Without Plan; With Means to [place]  Homicidal Thoughts:Homicidal Thoughts: No   Sensorium  Memory:Immediate Fair; Recent Fair; Remote Fair  Judgment:Fair  Insight:Fair   Executive Functions  Concentration:Fair  Attention Span:Fair  [place]   Psychomotor Activity  Psychomotor Activity:Psychomotor Activity: Normal  Assets  Assets:Communication Skills; Desire for  Improvement; Financial Resources/Insurance; Housing; Resilience; Social Support  Sleep  Sleep:Sleep: Fair (per patient report)  Physical Exam: Physical Exam Vitals and nursing note reviewed.  Constitutional:      Appearance: He is well-developed.  HENT:     Head: Normocephalic.  Eyes:     Pupils: Pupils are equal, round, and reactive to light.  Pulmonary:     Effort: Pulmonary effort is normal.  Musculoskeletal:        General: Normal range of motion.  Neurological:     General: No focal deficit present.     Mental Status: He is alert and oriented to person, place, and time.  Psychiatric:  Attention and Perception: Attention normal. He does not perceive auditory or visual hallucinations.        Mood and Affect: Mood is depressed. Affect is angry.        Speech: Speech normal.        Behavior: Behavior normal. Behavior is cooperative.        Thought Content: Thought content is not paranoid or delusional. Thought content includes suicidal ideation. Thought content does not include homicidal ideation. Thought content includes suicidal (jump off a bridge) plan. Thought content does not include homicidal plan.        Cognition and Memory: Cognition normal.   Review of Systems  Constitutional: Negative.  Negative for fever.  HENT: Negative.  Negative for congestion and sore throat.   Respiratory:  Negative for cough and shortness of breath.   Cardiovascular: Negative.  Negative for chest pain.  Psychiatric/Behavioral:  Positive for depression, substance abuse and suicidal ideas.    Blood pressure 115/80, pulse 93, temperature 99.2 F (37.3 C), temperature source Oral, resp. rate 17, height 5\' 7"  (1.702 m), weight 70.3 kg, SpO2 99 %. Body mass index is 24.28 kg/m.  Treatment Plan Summary: Daily contact with patient to assess and evaluate symptoms and progress in treatment and Medication management  Home psychiatric medications restarted:  Buspar 7.5 mg PO BID for  anxiety Remeron 15 mg PO at bedtime for depression Seroquel 100 mg PO at bedtime for mood  and depression  Disposition:  Patient would benefit from restarting home medications and overnight observation. Patient okay to transfer to [facility] for observation and AM reassessment.   Ethelene Hal, NP 03/09/2021 4:18 PM

## 2021-03-09 NOTE — ED Notes (Signed)
Report called to Materials engineer at [facility]. Safe transport called, message left. ED RN will await call back from safe transport to arrange transportation for pt to [facility].

## 2021-03-09 NOTE — ED Notes (Signed)
Safe transport called.  No answer, 2nd message left.

## 2021-03-09 NOTE — Progress Notes (Signed)
Nurse to nurse report given to to [facility], [facility]. Safe transport called. Night nurses updated.
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
## 2021-03-09 NOTE — ED Notes (Signed)
Pt escorted to [facility] vehicle for transport to [facility] w/ ED Tech. Pt vitally stable and in NAD upon transfer to [facility].
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
## 2021-03-09 NOTE — ED Notes (Signed)
Safe transport called, no answer, 5th message left.  ED Charge notified and aware.

## 2021-03-09 NOTE — ED Notes (Signed)
Pt given saltine crackers, pb and coke on ice at this time.

## 2021-03-09 NOTE — BH Assessment (Signed)
[facility] Assessment Progress Note   Per Jinny Blossom, NP, pt would benefit from admission to the [facility], and Dr Lovette Cliche agrees to accept pt.  Pt is voluntary.  This Probation officer has spoken to him and he agrees to transfero.  Please call report to [phone].  Pt is to be transported via [employer].  EDP Lacretia Leigh, MD and pt's nurse, Amber, have been notified.  Jalene Mullet, [place] Coordinator [phone]

## 2021-03-09 NOTE — ED Notes (Signed)
Pt given breakfast tray at this time. 

## 2021-03-09 NOTE — ED Notes (Addendum)
Safe transport called, no answer, 3rd message left. ED Charge nurse notified and aware.

## 2021-03-09 NOTE — ED Notes (Signed)
Safe transport called, no answer, 4th message left.

## 2021-03-09 NOTE — ED Provider Notes (Signed)
Behavioral Health Urgent Care Medical Screening Exam  Patient Name: John [name] MRN: [id number] Date of Evaluation: 03/09/21 Chief Complaint:   Diagnosis:  Final diagnoses:  None    History of Present illness: John [name] is a 50 y.o. male patient presented to [facility] from [facility] ED to be admitted to continuous assessment for safety and stabilization and to allow social work to assist with outpatient psychiatric and substance use resources/referral.  Upon arrival patient was unable to get out of Safe Transport vehicle  without assistance, then had to have someone support him while coming in building.  Once in building needed assistance to ambulated to bathroom.  Patient states he is having pain in both legs related to sciatic pain in both.  Patient asked to walk on his own but did not take more than two steps related to the pain and difficult putting pressure on legs while trying to ambulate.  Patient assisted back to chair.  Informed that he would have to go back to ED related to fall risk and would not be able to do a 1:1 to assist him here.   Spoke to Dr. Pearline Cables at [facility] ED and informed that patient would be coming back to [facility] related to fall risk and unable to accommodate here at [facility] continuous assessment unit.       Psychiatric Specialty Exam  Presentation  General Appearance:Disheveled  Eye Contact:Minimal  Speech:Clear and Coherent; Normal Rate  Speech Volume:Normal  Handedness:Right   Mood and Affect  Mood:Irritable; Depressed  Affect:Congruent   Thought Process  Thought Processes:Coherent  Descriptions of Associations:Intact  Orientation:Full (Time, Place and Person)  Thought Content:Logical  Diagnosis of Schizophrenia or Schizoaffective disorder in past: No   Hallucinations:None CAH instructing him to harm himself CAH instructing him to harm himself blkack spots  Ideas of Reference:None  Suicidal Thoughts:Yes, Passive With Intent; With Plan; With Means  to Carry Out; With Access to Means Without Intent; Without Plan; With Means to [place]  Homicidal Thoughts:No Without Intent; Without Plan   Sensorium  Memory:Immediate Good; Recent Good  Judgment:Fair  Insight:Fair   Executive Functions  Concentration:Fair  Attention Span:Fair  [place]   Psychomotor Activity  Psychomotor Activity:Normal   Assets  Assets:Communication Skills; Desire for Improvement; Financial Resources/Insurance; Housing   Sleep  Sleep:Fair  Number of hours: 2   Nutritional Assessment (For OBS and FBC admissions only) Has the patient had a weight loss or gain of 10 pounds or more in the last 3 months?: No Has the patient had a decrease in food intake/or appetite?: No Does the patient have dental problems?: No Does the patient have eating habits or behaviors that may be indicators of an eating disorder including binging or inducing vomiting?: No Has the patient recently lost weight without trying?: 0 Has the patient been eating poorly because of a decreased appetite?: 0 Malnutrition Screening Tool Score: 0    Physical Exam: Physical Exam Vitals and nursing note reviewed. Exam conducted with a chaperone present.  Constitutional:      General: He is not in acute distress.    Appearance: Normal appearance. He is not ill-appearing.  Cardiovascular:     Rate and Rhythm: Normal rate.  Pulmonary:     Effort: Pulmonary effort is normal.  Musculoskeletal:     Cervical back: Normal range of motion.     Comments: Reporting he is unable to ambulate related to sciatic pain in both legs.  Dragging  left  leg, severe limp  Skin:    General: Skin is warm and dry.  Neurological:     Mental Status: He is alert and oriented to person, place, and time.  Psychiatric:        Attention and Perception: Attention and perception normal. He does not perceive auditory or visual hallucinations.        Mood and  Affect: Mood is anxious (irritable).        Speech: Speech normal.        Thought Content: Thought content includes suicidal (Passive) ideation.        Cognition and Memory: Cognition and memory normal.        Judgment: Judgment is impulsive.   Review of Systems  Musculoskeletal:  Positive for myalgias.       Sciatica pain both legs, difficulty with ambulation   Psychiatric/Behavioral:  Positive for depression and substance abuse. Hallucinations: Denies. Suicidal ideas: Passive.The patient is nervous/anxious and has insomnia.   There were no vitals taken for this visit. There is no height or weight on file to calculate BMI.  Musculoskeletal: Strength & Muscle Tone:  Difficulty ambulating Gait & Station: unsteady, unable to stand, Sever limp Patient leans: N/A   [facility] MSE Discharge Disposition for Follow up and Recommendations: Patient to be transferred back to [facility] ED for safety and stabilization; patient will be reassessed by TTS counselor or psychiatric provider tomorrow morning for disposition.     Kathaleya Mcduffee, NP 03/09/2021, 6:58 PM

## 2021-03-09 NOTE — Progress Notes (Signed)
TOC CSW was consulted due to transportation needs. CSW contacted Beverely Low with [facility] to provide safe transport ride to [employer]. He stated they will be here for transport within the hour.  Arlie Solomons.Hanley Woerner, MSW, [place]   Transitions of Care Clinical Social Worker I Direct Dial: [phone]   Fax: [phone] [email or web address]

## 2021-03-10 MED ORDER — DARUN-COBIC-EMTRICIT-TENOFAF 800-150-200-10 MG PO TABS: 1.0000 | ORAL_TABLET | Freq: Every day | ORAL | Status: DC

## 2021-03-10 MED ORDER — BUSPIRONE HCL 5 MG PO TABS
7.5000 mg | ORAL_TABLET | Freq: Two times a day (BID) | ORAL | Status: DC
  Filled 2021-03-10 (×2): qty 1.5

## 2021-03-10 MED ORDER — QUETIAPINE FUMARATE 100 MG PO TABS: 100.0000 mg | ORAL_TABLET | Freq: Every day | ORAL | Status: DC

## 2021-03-10 MED ORDER — ALBUTEROL SULFATE HFA 108 (90 BASE) MCG/ACT IN AERS: 1.0000 | INHALATION_SPRAY | RESPIRATORY_TRACT | Status: DC | PRN

## 2021-03-10 MED ORDER — MIRTAZAPINE 7.5 MG PO TABS: 15.0000 mg | ORAL_TABLET | Freq: Every day | ORAL | Status: DC

## 2021-03-10 NOTE — ED Notes (Signed)
Pt given breakfast tray

## 2021-03-10 NOTE — ED Notes (Signed)
Pt given 2nd breakfast tray per pt request.

## 2021-03-10 NOTE — Discharge Instructions (Addendum)
For your behavioral health needs you are advised to continue treatment at [facility].  Your next appointment with Trinna Post, PA is scheduled for Tuesday, April 07, 2021 at 9:00 am:       [facility]      [place], [place] [address]      [phone]   For future mental health issues and/or crisis, you may go directly to the [facility].

## 2021-03-10 NOTE — ED Notes (Signed)
Pt given lunch tray.

## 2021-03-10 NOTE — ED Notes (Signed)
Pt ambulated to the restroom w/ steady gait w/out need for assistance.

## 2021-03-10 NOTE — ED Provider Notes (Signed)
[place] team has re-evaluated and indicates psych clear for d/c, and that pt has follow up appt arranged.   Pt alert, content, no disteress. Pt with normal mood and affect. No SI. Pt is not responding to internal stimuli - no acute psychosis. Vitals normal.  Pt currently appears stable for d/c per [facility] plan.      Lajean Saver, MD 03/10/21 [phone]

## 2021-03-10 NOTE — Consult Note (Signed)
[facility] Consult   Reason for Consult:  Patient has been off his medication for the past 2 months. Referring Physician:  EDP MD Purcell Mouton Patient Identification: Natanel Snavely MRN:  [id number] Principal Diagnosis: Suicidal ideation Diagnosis:  Principal Problem:   Suicidal ideation   Total Time spent with patient: 15 minutes  Subjective:   Leonides Minder is a 50 y.o. male patient admitted suicidal ideation due to been off his medications for the past 2 months.  Patient was recently transported to [facility] urgent care for overnight observation to be restarted on medications however it was reported that patient was unable to walk without assistance once he arrived to this facility.   Patient reports " I have a sciatica problem."  Discussed restarting home medications and will prescribed  medications to local pharmacy.  Patient was receptive to plan.    Patient to follow-up with outpatient provider at [facility].  Case staffed with attending psychiatrist Cinderella.  Support, encouragement and  reassurance was provided.  HPI: Per admission assessment note: "Patient is a 50 year old male who presented voluntarily to [facility] with SI and a plan to jump off a bridge. He denies HI/AVH, paranoia and delusions. He is calm and cooperative. His appearance is disheveled, he maintains fair eye contact. He is sitting on the stretcher, eating his lunch. He stated he has been off his medications for 1.5 months and had no money to get them. Patient goes to [facility] for medication management, last documented visit for refills was 01/27/2021. Today patient stated he has been living with a friend and they had a huge argument. He declined to say what the argument was about. Per notes in chart, [employer] was called and were arresting the patient when he voiced SI."  Past Psychiatric History: patient is well know to this services, with a history depression, anxiety , substance use disorder  and suicidal ideations.   Risk to Self:   Risk to Others:   Prior Inpatient Therapy:   Prior Outpatient Therapy:    Past Medical History:  Past Medical History:  Diagnosis Date   Alcohol abuse    Alcoholism ([place])    Anxiety    Asthma    Asthma    Bipolar disorder ([place])    Chronic low back pain    Cocaine abuse ([place])    Depression    Gout    Gout    HIV (human immunodeficiency virus infection) ([place])    "dx'd ~ 2 yr ago" (09/29/2012)   HIV (human immunodeficiency virus infection) ([place])    HIV disease ([place])    Homelessness    Hypertension    Marijuana abuse    Mental disorder    Schizophrenia ([place])     Past Surgical History:  Procedure Laterality Date   SKIN GRAFT FULL THICKNESS LEG Left ?   POSTERIOR LEFT LEG  AFTER DOG BITES   Family History:  Family History  Problem Relation Age of Onset   Alcoholism Mother    Depression Mother    Alcoholism Brother    Family Psychiatric  History:  Social History:  Social History   Substance and Sexual Activity  Alcohol Use Yes   Alcohol/week: 12.0 standard drinks   Types: 12 Cans of beer per week   Comment: Daily. "As much as I can get."     Social History   Substance and Sexual Activity  Drug Use Yes   Types: Marijuana, "Crack" cocaine   Comment: States been  drinking 4-40oz beers daily.    Social History   Socioeconomic History   Marital status: Single    Spouse name: Not on file   Number of children: 0   Years of education: Not on file   Highest education level: 9th grade  Occupational History   Occupation: unemployed  Tobacco Use   Smoking status: Every Day    Packs/day: 0.50    Years: 27.00    Pack years: 13.50    Types: Cigarettes   Smokeless tobacco: Never  Vaping Use   Vaping Use: Never used  Substance and Sexual Activity   Alcohol use: Yes    Alcohol/week: 12.0 standard drinks    Types: 12 Cans of beer per week    Comment: Daily. "As much as I can get."   Drug use: Yes    Types: Marijuana,  "Crack" cocaine    Comment: States been drinking 4-40oz beers daily.   Sexual activity: Not Currently    Partners: Male    Birth control/protection: Condom    Comment: given condoms 01/2021  Other Topics Concern   Not on file  Social History Narrative   ** Merged History Encounter **       ** Merged History Encounter **       Social Determinants of Health   Financial Resource Strain: High Risk   Difficulty of Paying Living Expenses: Very hard  Food Insecurity: Landscape architect Present   Worried About Charity fundraiser in the Last Year: Sometimes true   Arboriculturist in the Last Year: Sometimes true  Transportation Needs: Public librarian (Medical): Yes   Lack of Transportation (Non-Medical): Yes  Physical Activity: Inactive   Days of Exercise per Week: 0 days   Minutes of Exercise per Session: 0 min  Stress: Stress Concern Present   Feeling of Stress : Rather much  Social Connections: Moderately Isolated   Frequency of Communication with Friends and Family: Three times a week   Frequency of Social Gatherings with Friends and Family: Three times a week   Attends Religious Services: Never   Active Member of Clubs or Organizations: Yes   Attends Music therapist: More than 4 times per year   Marital Status: Never married   Additional Social History:    Allergies:   Allergies  Allergen Reactions   Shellfish Allergy Anaphylaxis and Swelling    Labs:  Results for orders placed or performed during the hospital encounter of 03/09/21 (from the past 48 hour(s))  Ethanol     Status: None   Collection Time: 03/09/21  8:02 AM  Result Value Ref Range   Alcohol, Ethyl (B) <10 <10 mg/dL    Comment: (NOTE) Lowest detectable limit for serum alcohol is 10 mg/dL.  For medical purposes only. Performed at [facility], [place] [address], [place], [place] [address]   Salicylate level     Status: Abnormal    Collection Time: 03/09/21  8:02 AM  Result Value Ref Range   Salicylate Lvl <1.0 (L) 7.0 - 30.0 mg/dL    Comment: Performed at [facility], [place] [address], [place], [place] [address]  Acetaminophen level     Status: Abnormal   Collection Time: 03/09/21  8:02 AM  Result Value Ref Range   Acetaminophen (Tylenol), Serum <10 (L) 10 - 30 ug/mL    Comment: (NOTE) Therapeutic concentrations vary significantly. A range of 10-30 ug/mL  may be an  effective concentration for many patients. However, some  are best treated at concentrations outside of this range. Acetaminophen concentrations >150 ug/mL at 4 hours after ingestion  and >50 ug/mL at 12 hours after ingestion are often associated with  toxic reactions.  Performed at [facility], [place] [address] [place], [place] [address]   CBC with Differential/Platelet     Status: Abnormal   Collection Time: 03/09/21  8:02 AM  Result Value Ref Range   WBC 5.9 4.0 - 10.5 K/uL   RBC 4.19 (L) 4.22 - 5.81 MIL/uL   Hemoglobin 13.3 13.0 - 17.0 g/dL   HCT 41.0 39.0 - 52.0 %   MCV 97.9 80.0 - 100.0 fL   MCH 31.7 26.0 - 34.0 pg   MCHC 32.4 30.0 - 36.0 g/dL   RDW 12.4 11.5 - 15.5 %   Platelets 423 (H) 150 - 400 K/uL   nRBC 0.0 0.0 - 0.2 %   Neutrophils Relative % 55 %   Neutro Abs 3.3 1.7 - 7.7 K/uL   Lymphocytes Relative 28 %   Lymphs Abs 1.6 0.7 - 4.0 K/uL   Monocytes Relative 10 %   Monocytes Absolute 0.6 0.1 - 1.0 K/uL   Eosinophils Relative 6 %   Eosinophils Absolute 0.3 0.0 - 0.5 K/uL   Basophils Relative 1 %   Basophils Absolute 0.0 0.0 - 0.1 K/uL   Immature Granulocytes 0 %   Abs Immature Granulocytes 0.02 0.00 - 0.07 K/uL    Comment: Performed at [facility], [place] [address] [place], [place] [address]  Comprehensive metabolic panel     Status: Abnormal   Collection Time: 03/09/21  8:02 AM  Result Value Ref Range   Sodium 139 135 - 145 mmol/L   Potassium 4.2 3.5 - 5.1 mmol/L    Chloride 107 98 - 111 mmol/L   CO2 24 22 - 32 mmol/L   Glucose, Bld 96 70 - 99 mg/dL    Comment: Glucose reference range applies only to samples taken after fasting for at least 8 hours.   BUN 23 (H) 6 - 20 mg/dL   Creatinine, Ser 1.20 0.61 - 1.24 mg/dL   Calcium 8.6 (L) 8.9 - 10.3 mg/dL   Total Protein 7.2 6.5 - 8.1 g/dL   Albumin 3.6 3.5 - 5.0 g/dL   AST 21 15 - 41 U/L   ALT 14 0 - 44 U/L   Alkaline Phosphatase 85 38 - 126 U/L   Total Bilirubin 0.5 0.3 - 1.2 mg/dL   GFR, Estimated >60 >60 mL/min    Comment: (NOTE) Calculated using the CKD-EPI Creatinine Equation (2021)    Anion gap 8 5 - 15    Comment: Performed at [facility], [place] [address], [place], [place] [address]  Rapid urine drug screen (hospital performed)     Status: Abnormal   Collection Time: 03/09/21  8:03 AM  Result Value Ref Range   Opiates NONE DETECTED NONE DETECTED   Cocaine POSITIVE (A) NONE DETECTED   Benzodiazepines NONE DETECTED NONE DETECTED   Amphetamines NONE DETECTED NONE DETECTED   Tetrahydrocannabinol POSITIVE (A) NONE DETECTED   Barbiturates NONE DETECTED NONE DETECTED    Comment: (NOTE) DRUG SCREEN FOR MEDICAL PURPOSES ONLY.  IF CONFIRMATION IS NEEDED FOR ANY PURPOSE, NOTIFY LAB WITHIN 5 DAYS.  LOWEST DETECTABLE LIMITS FOR URINE DRUG SCREEN Drug Class  Cutoff (ng/mL) Amphetamine and metabolites    1000 Barbiturate and metabolites    200 Benzodiazepine                 127 Tricyclics and metabolites     300 Opiates and metabolites        300 Cocaine and metabolites        300 THC                            50 Performed at [facility], [place] [address], [place], [place] [address]   Resp Panel by RT-PCR (Flu A&B, Covid) Nasopharyngeal Swab     Status: None   Collection Time: 03/09/21  9:03 AM   Specimen: Nasopharyngeal Swab; Nasopharyngeal(NP) swabs in vial transport medium  Result Value Ref Range   SARS Coronavirus 2 by RT PCR  NEGATIVE NEGATIVE    Comment: (NOTE) SARS-CoV-2 target nucleic acids are NOT DETECTED.  The SARS-CoV-2 RNA is generally detectable in upper respiratory specimens during the acute phase of infection. The lowest concentration of SARS-CoV-2 viral copies this assay can detect is 138 copies/mL. A negative result does not preclude SARS-Cov-2 infection and should not be used as the sole basis for treatment or other patient management decisions. A negative result may occur with  improper specimen collection/handling, submission of specimen other than nasopharyngeal swab, presence of viral mutation(s) within the areas targeted by this assay, and inadequate number of viral copies(<138 copies/mL). A negative result must be combined with clinical observations, patient history, and epidemiological information. The expected result is Negative.  Fact Sheet for Patients:  EntrepreneurPulse.com.au  Fact Sheet for Healthcare Providers:  IncredibleEmployment.be  This test is no t yet approved or cleared by the [place] FDA and  has been authorized for detection and/or diagnosis of SARS-CoV-2 by FDA under an Emergency Use Authorization (EUA). This EUA will remain  in effect (meaning this test can be used) for the duration of the COVID-19 declaration under Section 564(b)(1) of the Act, 21 U.S.C.section 360bbb-3(b)(1), unless the authorization is terminated  or revoked sooner.       Influenza A by PCR NEGATIVE NEGATIVE   Influenza B by PCR NEGATIVE NEGATIVE    Comment: (NOTE) The Xpert Xpress SARS-CoV-2/FLU/RSV plus assay is intended as an aid in the diagnosis of influenza from Nasopharyngeal swab specimens and should not be used as a sole basis for treatment. Nasal washings and aspirates are unacceptable for Xpert Xpress SARS-CoV-2/FLU/RSV testing.  Fact Sheet for Patients: EntrepreneurPulse.com.au  Fact Sheet for Healthcare  Providers: IncredibleEmployment.be  This test is not yet approved or cleared by the [place] FDA and has been authorized for detection and/or diagnosis of SARS-CoV-2 by FDA under an Emergency Use Authorization (EUA). This EUA will remain in effect (meaning this test can be used) for the duration of the COVID-19 declaration under Section 564(b)(1) of the Act, 21 U.S.C. section 360bbb-3(b)(1), unless the authorization is terminated or revoked.  Performed at [facility], [place] [address] [place], [place] [address]     Current Facility-Administered Medications  Medication Dose Route Frequency Provider Last Rate Last Admin   albuterol (VENTOLIN HFA) 108 (90 Base) MCG/ACT inhaler 1-2 puff  1-2 puff Inhalation Q4H PRN Ethelene Hal, NP       busPIRone (BUSPAR) tablet 7.5 mg  7.5 mg Oral BID Ethelene Hal, NP       [START ON 03/11/2021] Darunavir-Cobicistat-Emtricitabine-Tenofovir Alafenamide [facility])  800-150-200-10 MG TABS 1 tablet  1 tablet Oral Q breakfast Ethelene Hal, NP       mirtazapine (REMERON) tablet 15 mg  15 mg Oral QHS Ethelene Hal, NP       QUEtiapine (SEROQUEL) tablet 100 mg  100 mg Oral QHS Ethelene Hal, NP       Current Outpatient Medications  Medication Sig Dispense Refill   busPIRone (BUSPAR) 7.5 MG tablet Take 1 tablet (7.5 mg total) by mouth 2 (two) times daily. 60 tablet 1   mirtazapine (REMERON) 15 MG tablet Take 1 tablet (15 mg total) by mouth at bedtime. 30 tablet 1   QUEtiapine (SEROQUEL) 100 MG tablet Take 1 tablet (100 mg total) by mouth at bedtime. 30 tablet 1   SYMTUZA 800-150-200-10 MG TABS Take 1 tablet by mouth every morning. 30 tablet 5   traZODone (DESYREL) 100 MG tablet Take 100 mg by mouth at bedtime.     albuterol (VENTOLIN HFA) 108 (90 Base) MCG/ACT inhaler Inhale 2 puffs into the lungs every 4 (four) hours as needed for wheezing or shortness of breath. 18 g 0    albuterol (VENTOLIN HFA) 108 (90 Base) MCG/ACT inhaler Inhale 1-2 puffs into the lungs every 6 (six) hours as needed for wheezing or shortness of breath. 6.7 g 2   predniSONE (DELTASONE) 50 MG tablet Take 1 tablet (50 mg total) by mouth daily. (Patient not taking: Reported on 02/10/2021) 5 tablet 0    Musculoskeletal: Strength & Muscle Tone: within normal limits Gait & Station: normal Patient leans: N/A  Psychiatric Specialty Exam:  Presentation  General Appearance: Disheveled  Eye Contact:Minimal  Speech:Clear and Coherent; Normal Rate  Speech Volume:Normal  Handedness:Right   Mood and Affect  Mood:Irritable; Depressed  Affect:Congruent   Thought Process  Thought Processes:Coherent  Descriptions of Associations:Intact  Orientation:Full (Time, Place and Person)  Thought Content:Logical  History of Schizophrenia/Schizoaffective disorder:No  Duration of Psychotic Symptoms:N/A  Hallucinations:Hallucinations: None  Ideas of Reference:None  Suicidal Thoughts:Suicidal Thoughts: Yes, Passive SI Passive Intent and/or Plan: Without Intent; Without Plan; With Means to Carry Out  Homicidal Thoughts:Homicidal Thoughts: No   Sensorium  Memory:Immediate Good; Recent Good  Judgment:Fair  Insight:Fair   Executive Functions  Concentration:Fair  Attention Span:Fair  [place]   Psychomotor Activity  Psychomotor Activity:Psychomotor Activity: Normal   Assets  Assets:Communication Skills; Desire for Improvement; Financial Resources/Insurance; Housing   Sleep  Sleep:Sleep: Fair   Physical Exam: Physical Exam Vitals and nursing note reviewed.  Skin:    General: Skin is warm and dry.  Neurological:     Mental Status: He is alert.  Psychiatric:        Attention and Perception: Attention normal.        Mood and Affect: Mood normal.        Speech: Speech normal.        Behavior: Behavior normal. Behavior is  cooperative.        Thought Content: Thought content normal.        Cognition and Memory: Cognition and memory normal.        Judgment: Judgment is not impulsive.   Review of Systems  Eyes: Negative.   Cardiovascular: Negative.   Musculoskeletal: Negative.   Psychiatric/Behavioral:  Positive for depression. The patient is nervous/anxious.   All other systems reviewed and are negative. Blood pressure 131/81, pulse 86, temperature 98.6 F (37 C), resp. rate 16, height 5\' 7"  (1.702 m), weight 70.3 kg, SpO2  100 %. Body mass index is 24.28 kg/m.  Treatment Plan Summary: Medication management  Disposition: No evidence of imminent risk to self or others at present.   Patient does not meet criteria for psychiatric inpatient admission. Supportive therapy provided about ongoing stressors. Refer to IOP. Discussed crisis plan, support from social network, calling 911, coming to the Emergency Department, and calling Suicide Hotline.  Derrill [place], NP 03/10/2021 3:54 PM

## 2021-03-10 NOTE — ED Notes (Signed)
Pt states understanding of dc instructions, importance of follow up. Pt given bus pass for transportation. Pt denies questions or concerns upon dc. Pt declined wheelchair assistance upon dc. Pt ambulated out of ed w/ steady gait. No belongings left on bed upon dc.

## 2021-03-10 NOTE — ED Notes (Signed)
Pt resting in room no signs of distress.

## 2021-03-10 NOTE — BH Assessment (Signed)
[place] Assessment Progress Note   Per Ricky Ala, NP, this voluntary pt does not require psychiatric hospitalization at this time.  Pt is psychiatrically cleared.  Discharge instructions advise pt to continue treatment wih Trinna Post, PA at [facility].  EDP Blanchie Dessert, MD and pt's nurse, Amber, have been notified.  Jalene Mullet, [place] Triage Specialist [phone]

## 2021-03-19 ENCOUNTER — Telehealth (HOSPITAL_COMMUNITY): Payer: Self-pay

## 2021-03-19 NOTE — BH Assessment (Signed)
Care Management - [place] Follow Up Discharges   Writer attempted to make contact with patient today and was unsuccessful.  Writer left a HIPPA compliant voice message.   Per chart review, patient will follow up with established provider Trinna Post, PA on 04-07-2021.

## 2021-03-29 ENCOUNTER — Ambulatory Visit (HOSPITAL_COMMUNITY)
Admission: EM | Admit: 2021-03-29 | Discharge: 2021-03-31 | Disposition: A | Discharge status: Home / Self Care | Payer: Medicaid Other | Attending: Nurse Practitioner | Admitting: Nurse Practitioner

## 2021-03-29 ENCOUNTER — Other Ambulatory Visit: Payer: Self-pay

## 2021-03-29 DIAGNOSIS — F5105 Insomnia due to other mental disorder: Secondary | ICD-10-CM | POA: Diagnosis not present

## 2021-03-29 DIAGNOSIS — B20 Human immunodeficiency virus [HIV] disease: Secondary | ICD-10-CM | POA: Diagnosis not present

## 2021-03-29 DIAGNOSIS — F1410 Cocaine abuse, uncomplicated: Secondary | ICD-10-CM | POA: Insufficient documentation

## 2021-03-29 DIAGNOSIS — F333 Major depressive disorder, recurrent, severe with psychotic symptoms: Secondary | ICD-10-CM

## 2021-03-29 DIAGNOSIS — F315 Bipolar disorder, current episode depressed, severe, with psychotic features: Secondary | ICD-10-CM | POA: Diagnosis not present

## 2021-03-29 DIAGNOSIS — F411 Generalized anxiety disorder: Secondary | ICD-10-CM | POA: Diagnosis not present

## 2021-03-29 DIAGNOSIS — F17210 Nicotine dependence, cigarettes, uncomplicated: Secondary | ICD-10-CM | POA: Insufficient documentation

## 2021-03-29 DIAGNOSIS — F4310 Post-traumatic stress disorder, unspecified: Secondary | ICD-10-CM

## 2021-03-29 DIAGNOSIS — Z21 Asymptomatic human immunodeficiency virus [HIV] infection status: Secondary | ICD-10-CM

## 2021-03-29 DIAGNOSIS — F1290 Cannabis use, unspecified, uncomplicated: Secondary | ICD-10-CM | POA: Insufficient documentation

## 2021-03-29 DIAGNOSIS — Z560 Unemployment, unspecified: Secondary | ICD-10-CM | POA: Insufficient documentation

## 2021-03-29 DIAGNOSIS — Z79899 Other long term (current) drug therapy: Secondary | ICD-10-CM | POA: Insufficient documentation

## 2021-03-29 DIAGNOSIS — Z5900 Homelessness unspecified: Secondary | ICD-10-CM | POA: Insufficient documentation

## 2021-03-29 DIAGNOSIS — F3164 Bipolar disorder, current episode mixed, severe, with psychotic features: Secondary | ICD-10-CM

## 2021-03-29 DIAGNOSIS — Z20822 Contact with and (suspected) exposure to covid-19: Secondary | ICD-10-CM | POA: Insufficient documentation

## 2021-03-29 LAB — POCT URINE DRUG SCREEN - MANUAL ENTRY (I-SCREEN)
POC Amphetamine UR: NOT DETECTED
POC Buprenorphine (BUP): NOT DETECTED
POC Cocaine UR: POSITIVE — AB
POC Marijuana UR: POSITIVE — AB
POC Methadone UR: NOT DETECTED
POC Methamphetamine UR: NOT DETECTED
POC Morphine: NOT DETECTED
POC Oxazepam (BZO): NOT DETECTED
POC Oxycodone UR: NOT DETECTED
POC Secobarbital (BAR): NOT DETECTED

## 2021-03-29 LAB — POC SARS CORONAVIRUS 2 AG -  ED: SARS Coronavirus 2 Ag: NEGATIVE

## 2021-03-29 LAB — CBC WITH DIFFERENTIAL/PLATELET
Abs Immature Granulocytes: 0 10*3/uL (ref 0.00–0.07)
Basophils Absolute: 0 10*3/uL (ref 0.0–0.1)
Basophils Relative: 1 %
Eosinophils Absolute: 0.4 10*3/uL (ref 0.0–0.5)
Eosinophils Relative: 10 %
HCT: 38.6 % — ABNORMAL LOW (ref 39.0–52.0)
Hemoglobin: 13.4 g/dL (ref 13.0–17.0)
Immature Granulocytes: 0 %
Lymphocytes Relative: 48 %
Lymphs Abs: 2.2 10*3/uL (ref 0.7–4.0)
MCH: 32.9 pg (ref 26.0–34.0)
MCHC: 34.7 g/dL (ref 30.0–36.0)
MCV: 94.8 fL (ref 80.0–100.0)
Monocytes Absolute: 0.7 10*3/uL (ref 0.1–1.0)
Monocytes Relative: 14 %
Neutro Abs: 1.2 10*3/uL — ABNORMAL LOW (ref 1.7–7.7)
Neutrophils Relative %: 27 %
Platelets: 397 10*3/uL (ref 150–400)
RBC: 4.07 MIL/uL — ABNORMAL LOW (ref 4.22–5.81)
RDW: 12.1 % (ref 11.5–15.5)
WBC: 4.6 10*3/uL (ref 4.0–10.5)
nRBC: 0 % (ref 0.0–0.2)

## 2021-03-29 LAB — RESP PANEL BY RT-PCR (FLU A&B, COVID) ARPGX2
Influenza A by PCR: NEGATIVE
Influenza B by PCR: NEGATIVE
SARS Coronavirus 2 by RT PCR: NEGATIVE

## 2021-03-29 LAB — MAGNESIUM: Magnesium: 2.1 mg/dL (ref 1.7–2.4)

## 2021-03-29 LAB — COMPREHENSIVE METABOLIC PANEL
ALT: 11 U/L (ref 0–44)
AST: 17 U/L (ref 15–41)
Albumin: 3.6 g/dL (ref 3.5–5.0)
Alkaline Phosphatase: 100 U/L (ref 38–126)
Anion gap: 7 (ref 5–15)
BUN: 16 mg/dL (ref 6–20)
CO2: 28 mmol/L (ref 22–32)
Calcium: 8.9 mg/dL (ref 8.9–10.3)
Chloride: 105 mmol/L (ref 98–111)
Creatinine, Ser: 0.9 mg/dL (ref 0.61–1.24)
GFR, Estimated: >60 mL/min (ref >60)
Glucose, Bld: 71 mg/dL (ref 70–99)
Potassium: 4.2 mmol/L (ref 3.5–5.1)
Sodium: 140 mmol/L (ref 135–145)
Total Bilirubin: 0.4 mg/dL (ref 0.3–1.2)
Total Protein: 6.6 g/dL (ref 6.5–8.1)

## 2021-03-29 LAB — LIPID PANEL
Cholesterol: 158 mg/dL (ref 0–200)
HDL: 70 mg/dL (ref >40)
LDL Cholesterol: 73 mg/dL (ref 0–99)
Total CHOL/HDL Ratio: 2.3 RATIO
Triglycerides: 73 mg/dL (ref <150)
VLDL: 15 mg/dL (ref 0–40)

## 2021-03-29 LAB — TSH: TSH: 2.169 u[IU]/mL (ref 0.350–4.500)

## 2021-03-29 LAB — ETHANOL: Alcohol, Ethyl (B): <10 mg/dL (ref <10)

## 2021-03-29 LAB — POC SARS CORONAVIRUS 2 AG: SARSCOV2ONAVIRUS 2 AG: NEGATIVE

## 2021-03-29 MED ORDER — BUSPIRONE HCL 15 MG PO TABS
7.5000 mg | ORAL_TABLET | Freq: Two times a day (BID) | ORAL | Status: DC
  Administered 2021-03-29 – 2021-03-31 (×6): 7.5 mg via ORAL
  Filled 2021-03-29 (×6): qty 1

## 2021-03-29 MED ORDER — MIRTAZAPINE 15 MG PO TABS
15.0000 mg | ORAL_TABLET | Freq: Every day | ORAL | Status: DC
  Administered 2021-03-29 – 2021-03-30 (×2): 15 mg via ORAL
  Filled 2021-03-29 (×2): qty 1

## 2021-03-29 MED ORDER — TRAZODONE HCL 100 MG PO TABS
100.0000 mg | ORAL_TABLET | Freq: Every day | ORAL | Status: DC
  Administered 2021-03-29 – 2021-03-30 (×2): 100 mg via ORAL
  Filled 2021-03-29 (×2): qty 1

## 2021-03-29 MED ORDER — MAGNESIUM HYDROXIDE 400 MG/5ML PO SUSP: 30.0000 mL | Freq: Every day | ORAL | Status: DC | PRN

## 2021-03-29 MED ORDER — DARUN-COBIC-EMTRICIT-TENOFAF 800-150-200-10 MG PO TABS
1.0000 | ORAL_TABLET | Freq: Every morning | ORAL | Status: DC
  Administered 2021-03-29 – 2021-03-30 (×2): 1 via ORAL
  Filled 2021-03-29 (×4): qty 1

## 2021-03-29 MED ORDER — ALUM & MAG HYDROXIDE-SIMETH 200-200-20 MG/5ML PO SUSP: 30.0000 mL | ORAL | Status: DC | PRN

## 2021-03-29 MED ORDER — ACETAMINOPHEN 325 MG PO TABS: 650.0000 mg | ORAL_TABLET | Freq: Four times a day (QID) | ORAL | Status: DC | PRN

## 2021-03-29 MED ORDER — QUETIAPINE FUMARATE 100 MG PO TABS
100.0000 mg | ORAL_TABLET | Freq: Every day | ORAL | Status: DC
  Administered 2021-03-29 – 2021-03-30 (×2): 100 mg via ORAL
  Filled 2021-03-29 (×2): qty 1

## 2021-03-29 NOTE — ED Notes (Addendum)
This RN attempted blood draw x 2 without success.  Mechele Claude RN was able to get blood and sent to lab at this time.
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
## 2021-03-29 NOTE — ED Notes (Signed)
Attempted, blood draw x 2.  Unsuccessful.  Pt pushing fluids at present.

## 2021-03-29 NOTE — ED Notes (Signed)
Pt A&O x 4, presents with homicidal ideations, carrying 2 butcher knives in his neighborhood, complaint of AVH & paranoia.  Denies SI.  Monitoring for safety.

## 2021-03-29 NOTE — ED Notes (Signed)
Pt sitting up in bed  awake and alert.  Provider currently with pt.

## 2021-03-29 NOTE — Progress Notes (Signed)
Patient meets criteria for inpatient treatment per Darrol Angel, NP. No appropriate beds at [facility] currently. CSW faxed referrals to the following facilities for review:     .[facility]   [facility]   [facility]   [facility]   [facility]     TTS will continue to seek bed placement.    Darletta Moll MSW, LCSW Clincal Social Worker  [facility]

## 2021-03-29 NOTE — ED Notes (Signed)
Pt resting quietly. No distress noted. Finished his meal and is awaiting night time meds.  Will continue to monitor for safety.

## 2021-03-29 NOTE — ED Notes (Signed)
Received patient this PM. Patient in his bed sleeping. Patient respirations are even and unlabored. Will continue to monitor for safety.

## 2021-03-29 NOTE — ED Provider Notes (Signed)
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 copies this assay can detect is 138 copies/mL. A negative result does not preclude SARS-Cov-2 infection and should not be used as the sole basis for treatment or other patient management decisions. A negative result may occur with  improper specimen collection/handling, submission of specimen other than nasopharyngeal swab, presence of viral mutation(s) within the areas targeted by this assay, and inadequate number of viral copies(<138 copies/mL). A negative result must be combined with clinical observations, patient history, and epidemiological information. The expected result is Negative.  Fact Sheet for Patients:  EntrepreneurPulse.com.au  Fact Sheet for Healthcare Providers:  IncredibleEmployment.be  This test is no                          t yet approved or cleared by the [place] FDA and  has been authorized for detection and/or diagnosis of SARS-CoV-2 by FDA under an Emergency Use Authorization (EUA). This EUA will remain  in effect (meaning this test can be used) for the duration of the COVID-19 declaration under Section 564(b)(1) of the Act, 21 U.S.C.section 360bbb-3(b)(1), unless the authorization is terminated  or revoked sooner.       Influenza A by PCR 12/22/2020 NEGATIVE  NEGATIVE Final   Influenza B by PCR 12/22/2020 NEGATIVE  NEGATIVE Final   Comment: (NOTE) The Xpert Xpress SARS-CoV-2/FLU/RSV plus assay is intended as an aid in the diagnosis of influenza from Nasopharyngeal swab specimens and should not be used as a sole basis for treatment. Nasal washings and aspirates are unacceptable for Xpert Xpress SARS-CoV-2/FLU/RSV testing.  Fact Sheet for Patients: EntrepreneurPulse.com.au  Fact Sheet for Healthcare Providers: IncredibleEmployment.be  This  test is not yet approved or cleared by the [place] FDA and has been authorized for detection and/or diagnosis of SARS-CoV-2 by FDA under an Emergency Use Authorization (EUA). This EUA will remain in effect (meaning this test can be used) for the duration of the COVID-19 declaration under Section 564(b)(1) of the Act, 21 U.S.C. section 360bbb-3(b)(1), unless the authorization is terminated or revoked.  Performed at [facility], [place] [address] [place], [place] [address]   Admission on 11/19/2020, Discharged on 11/24/2020  Component Date Value Ref Range Status   SARS Coronavirus 2 by RT PCR 11/19/2020 NEGATIVE  NEGATIVE Final   Comment: (NOTE) SARS-CoV-2 target nucleic acids are NOT DETECTED.  The SARS-CoV-2 RNA is generally detectable in upper respiratory specimens during the acute phase of infection. The lowest concentration of SARS-CoV-2 viral copies this assay can detect is 138 copies/mL. A negative result does not preclude SARS-Cov-2 infection and should not be used as the sole basis for treatment or other patient management decisions. A negative result may occur with  improper specimen collection/handling, submission of specimen other than nasopharyngeal swab, presence of viral mutation(s) within the areas targeted by this assay, and inadequate number of viral copies(<138 copies/mL). A negative result must be combined with clinical observations, patient history, and epidemiological information. The expected result is Negative.  Fact Sheet for Patients:  EntrepreneurPulse.com.au  Fact Sheet for Healthcare Providers:  IncredibleEmployment.be  This test is no  t yet approved or cleared by the [place] and  has been authorized for detection and/or diagnosis of SARS-CoV-2 by FDA under an Emergency Use Authorization (EUA). This EUA will remain  in effect (meaning this test can be used)  for the duration of the COVID-19 declaration under Section 564(b)(1) of the Act, 21 U.S.C.section 360bbb-3(b)(1), unless the authorization is terminated  or revoked sooner.       Influenza A by PCR 11/19/2020 NEGATIVE  NEGATIVE Final   Influenza B by PCR 11/19/2020 NEGATIVE  NEGATIVE Final   Comment: (NOTE) The Xpert Xpress SARS-CoV-2/FLU/RSV plus assay is intended as an aid in the diagnosis of influenza from Nasopharyngeal swab specimens and should not be used as a sole basis for treatment. Nasal washings and aspirates are unacceptable for Xpert Xpress SARS-CoV-2/FLU/RSV testing.  Fact Sheet for Patients: EntrepreneurPulse.com.au  Fact Sheet for Healthcare Providers: IncredibleEmployment.be  This test is not yet approved or cleared by the [place] FDA and has been authorized for detection and/or diagnosis of SARS-CoV-2 by FDA under an Emergency Use Authorization (EUA). This EUA will remain in effect (meaning this test can be used) for the duration of the COVID-19 declaration under Section 564(b)(1) of the Act, 21 U.S.C. section 360bbb-3(b)(1), unless the authorization is terminated or revoked.  Performed at [facility], [place] [address], [place], [place] [address]    WBC 11/19/2020 5.0  4.0 - 10.5 K/uL Final   RBC 11/19/2020 4.15 (L)  4.22 - 5.81 MIL/uL Final   Hemoglobin 11/19/2020 13.4  13.0 - 17.0 g/dL Final   HCT 11/19/2020 39.9  39.0 - 52.0 % Final   MCV 11/19/2020 96.1  80.0 - 100.0 fL Final   MCH 11/19/2020 32.3  26.0 - 34.0 pg Final   MCHC 11/19/2020 33.6  30.0 - 36.0 g/dL Final   RDW 11/19/2020 12.6  11.5 - 15.5 % Final   Platelets 11/19/2020 360  150 - 400 K/uL Final   nRBC 11/19/2020 0.0  0.0 - 0.2 % Final   Neutrophils Relative % 11/19/2020 42  % Final   Neutro Abs 11/19/2020 2.1  1.7 - 7.7 K/uL Final   Lymphocytes Relative 11/19/2020 39  % Final   Lymphs Abs 11/19/2020 1.9  0.7 - 4.0 K/uL Final   Monocytes  Relative 11/19/2020 13  % Final   Monocytes Absolute 11/19/2020 0.7  0.1 - 1.0 K/uL Final   Eosinophils Relative 11/19/2020 5  % Final   Eosinophils Absolute 11/19/2020 0.2  0.0 - 0.5 K/uL Final   Basophils Relative 11/19/2020 1  % Final   Basophils Absolute 11/19/2020 0.0  0.0 - 0.1 K/uL Final   Immature Granulocytes 11/19/2020 0  % Final   Abs Immature Granulocytes 11/19/2020 0.02  0.00 - 0.07 K/uL Final   Performed at [facility], [place] [address], [place], [place] [address]   Sodium 11/19/2020 137  135 - 145 mmol/L Final   Potassium 11/19/2020 4.1  3.5 - 5.1 mmol/L Final   Chloride 11/19/2020 103  98 - 111 mmol/L Final   CO2 11/19/2020 24  22 - 32 mmol/L Final   Glucose, Bld 11/19/2020 81  70 - 99 mg/dL Final   Glucose reference range applies only to samples taken after fasting for at least 8 hours.   BUN 11/19/2020 9  6 - 20 mg/dL Final   Creatinine, Ser 11/19/2020 0.94  0.61 - 1.24 mg/dL Final   Calcium 11/19/2020 9.3  8.9 - 10.3 mg/dL Final  Total Protein 11/19/2020 7.0  6.5 - 8.1 g/dL Final   Albumin 11/19/2020 4.0  3.5 - 5.0 g/dL Final   AST 11/19/2020 24  15 - 41 U/L Final   ALT 11/19/2020 17  0 - 44 U/L Final   Alkaline Phosphatase 11/19/2020 74  38 - 126 U/L Final   Total Bilirubin 11/19/2020 0.6  0.3 - 1.2 mg/dL Final   GFR, Estimated 11/19/2020 >60  >60 mL/min Final   Comment: (NOTE) Calculated using the CKD-EPI Creatinine Equation (2021)    Anion gap 11/19/2020 10  5 - 15 Final   Performed at [facility], [place] [address] [place], [place] [address]   Alcohol, Ethyl (B) 11/19/2020 <10  <10 mg/dL Final   Comment: (NOTE) Lowest detectable limit for serum alcohol is 10 mg/dL.  For medical purposes only. Performed at [facility], [place] [name], [place], [place] [address]    POC Amphetamine UR 11/20/2020 None Detected  NONE DETECTED (Cut Off Level 1000 ng/mL) Final   POC Secobarbital (BAR) 11/20/2020 None Detected  NONE DETECTED (Cut Off Level 300  ng/mL) Final   POC Buprenorphine (BUP) 11/20/2020 None Detected  NONE DETECTED (Cut Off Level 10 ng/mL) Final   POC Oxazepam (BZO) 11/20/2020 None Detected  NONE DETECTED (Cut Off Level 300 ng/mL) Final   POC Cocaine UR 11/20/2020 Positive (A)  NONE DETECTED (Cut Off Level 300 ng/mL) Final   POC Methamphetamine UR 11/20/2020 None Detected  NONE DETECTED (Cut Off Level 1000 ng/mL) Final   POC Morphine 11/20/2020 None Detected  NONE DETECTED (Cut Off Level 300 ng/mL) Final   POC Oxycodone UR 11/20/2020 None Detected  NONE DETECTED (Cut Off Level 100 ng/mL) Final   POC Methadone UR 11/20/2020 None Detected  NONE DETECTED (Cut Off Level 300 ng/mL) Final   POC Marijuana UR 11/20/2020 Positive (A)  NONE DETECTED (Cut Off Level 50 ng/mL) Final   SARS Coronavirus 2 Ag 11/19/2020 Negative  Negative Final   SARSCOV2ONAVIRUS 2 AG 11/19/2020 NEGATIVE  NEGATIVE Final   Comment: (NOTE) SARS-CoV-2 antigen NOT DETECTED.   Negative results are presumptive.  Negative results do not preclude SARS-CoV-2 infection and should not be used as the sole basis for treatment or other patient management decisions, including infection  control decisions, particularly in the presence of clinical signs and  symptoms consistent with COVID-19, or in those who have been in contact with the virus.  Negative results must be combined with clinical observations, patient history, and epidemiological information. The expected result is Negative.  Fact Sheet for Patients: HandmadeRecipes.com.cy  Fact Sheet for Healthcare Providers: FuneralLife.at  This test is not yet approved or cleared by the [place] FDA and  has been authorized for detection and/or diagnosis of SARS-CoV-2 by FDA under an Emergency Use Authorization (EUA).  This EUA will remain in effect (meaning this test can be used) for the duration of  the COV                          [place]-19 declaration under Section  564(b)(1) of the Act, 21 U.S.C. section 360bbb-3(b)(1), unless the authorization is terminated or revoked sooner.    Office Visit on 10/27/2020  Component Date Value Ref Range Status   Glucose, Bld 10/27/2020 87  65 - 99 mg/dL Final   Comment: .            Fasting reference interval .    BUN 10/27/2020 15  7 -  25 mg/dL Final   Creat 10/27/2020 0.98  0.60 - 1.29 mg/dL Final   eGFR 10/27/2020 95  > OR = 60 mL/min/1.81m Final   Comment: The eGFR is based on the CKD-EPI 2021 equation. To calculate  the new eGFR from a previous Creatinine or Cystatin C result, go to https://www.kidney.org/professionals/ kdoqi/gfr%5Fcalculator    BUN/Creatinine Ratio 060/10/9323NOT APPLICABLE  6 - 22 (calc) Final   Sodium 10/27/2020 142  135 - 146 mmol/L Final   Potassium 10/27/2020 4.3  3.5 - 5.3 mmol/L Final   Chloride 10/27/2020 104  98 - 110 mmol/L Final   CO2 10/27/2020 29  20 - 32 mmol/L Final   Calcium 10/27/2020 9.6  8.6 - 10.3 mg/dL Final   Total Protein 10/27/2020 7.3  6.1 - 8.1 g/dL Final   Albumin 10/27/2020 4.3  3.6 - 5.1 g/dL Final   Globulin 10/27/2020 3.0  1.9 - 3.7 g/dL (calc) Final   AG Ratio 10/27/2020 1.4  1.0 - 2.5 (calc) Final   Total Bilirubin 10/27/2020 0.6  0.2 - 1.2 mg/dL Final   Alkaline phosphatase (APISO) 10/27/2020 76  36 - 130 U/L Final   AST 10/27/2020 15  10 - 40 U/L Final   ALT 10/27/2020 11  9 - 46 U/L Final   HIV 1 RNA Quant 10/27/2020 Not Detected  Copies/mL Final   HIV-1 RNA Quant, Log 10/27/2020 Not Detected  Log cps/mL Final   Comment: . Reference Range:                           Not Detected     copies/mL                           Not Detected Log copies/mL . .Marland KitchenThe test was performed using Real-Time Polymerase Chain Reaction. . . Reportable Range: 20 copies/mL to 10,000,000 copies/mL (1.30 Log copies/mL to 7.00 Log copies/mL). .    CD4 T Cell Abs 10/27/2020 462  400 - 1,790 /uL Final   CD4 % Helper T Cell 10/27/2020 44  33 - 65 % Final    [facility] [address], [place] [id number]  RPR Ser Ql 10/27/2020 NON-REACTIVE  NON-REACTIVE Final    Allergies: Shellfish allergy  PTA Medications: (Not in a hospital admission)   Medical Decision Making  Patient unable to contract for safety at this time.  [facility] by [facility] in the morning 03/29/21. -Review available lab results -Monitor for safety   Recommendations  Based on my evaluation the patient does not appear to have an emergency medical condition. [facility].  SLucia Bitter NP 03/29/21  3:13 AM

## 2021-03-29 NOTE — ED Provider Notes (Signed)
Behavioral Health Progress Note  Date and Time: 03/29/2021 6:07 PM Name: John [name] MRN:  [id number]  Subjective:  John [name] is a 51 single male with history of Bipolar Disorder, Depression, GAD, and Insomnia brought in by [facility] for psychiatric assessment after a neighbor called the police on patient for having two butcher knives. Patient states that he has been off his medications for several weeks and he feels like he is going to hurt himself or someone else.  Patient seen and reevaluated face-to-face by this provider, chart reviewed and case discussed with Dr. Darleene Cleaver. On evaluation, patient is alert and oriented x4. His mood is dysphoric and affect is congruent.  He endorses suicidal ideations by any means possible with multiple plans that includes grabbing something and stabbing himself, breaking glass and cutting himself, or walking out into traffic. He is unable to contract for safety at this time. He endorses homicidal ideations towards no one's specific.  He endorses auditory and visual hallucinations. He describes the voices as loud, bossy, rambunctious that tells him to take his own life, and he is not worth living. He reports seeing pink elephants, gray horses, and yellow marshall men. There is no objective evidence that he is responding to internal or external stimuli.  He reports poor sleep for the past week. He reports a decreased appetite. He denies drinking alcohol or using illicit drugs. UDS pos for cocaine and THC. He reports that he has been off his medications for the past 4 to 5 months because he no longer has a care coordinator to bring him his medications. He reports that he has a follow-up appointment here at the [facility] his month for medication management.  Diagnosis:  Final diagnoses:  Bipolar disorder, current episode depressed, severe, with psychotic features ([place])  Depression, major, recurrent, severe with psychosis ([place])  Generalized anxiety disorder   Asymptomatic HIV infection, with no history of HIV-related illness ([place])  Insomnia due to other mental disorder    Total Time spent with patient: 20 minutes  Past Psychiatric History: Past psychiatric history significant for bipolar disorder with psychotic features, MDD recurrent severe with psychosis, GAD, PTSD, insomnia, alcohol use disorder/dependence, cocaine use disorder/dependence, cannabis use disorder/dependence, substance-induced mood disorder. [facility] admission from 11/19/20 - 11/24/20, [facility] admission from 09/17/20 - 09/23/20.  Patient has been psychiatrically hospitalized at [facility] and [facility] on multiple other occasions in the past as well. Past Medical History:  Past Medical History:  Diagnosis Date   Alcohol abuse    Alcoholism ([place])    Anxiety    Asthma    Asthma    Bipolar disorder ([place])    Chronic low back pain    Cocaine abuse ([place])    Depression    Gout    Gout    HIV (human immunodeficiency virus infection) ([place])    "dx'd ~ 2 yr ago" (09/29/2012)   HIV (human immunodeficiency virus infection) ([place])    HIV disease ([place])    Homelessness    Hypertension    Marijuana abuse    Mental disorder    Schizophrenia ([place])     Past Surgical History:  Procedure Laterality Date   SKIN GRAFT FULL THICKNESS LEG Left ?   POSTERIOR LEFT LEG  AFTER DOG BITES   Family History:  Family History  Problem Relation Age of Onset   Alcoholism Mother    Depression Mother    Alcoholism Brother    Family Psychiatric  History: No hx reported  Social History:  Social  History   Substance and Sexual Activity  Alcohol Use Yes   Alcohol/week: 12.0 standard drinks   Types: 12 Cans of beer per week   Comment: Daily. "As much as I can get."     Social History   Substance and Sexual Activity  Drug Use Yes   Types: Marijuana, "Crack" cocaine   Comment: States been drinking 4-40oz beers daily.    Social History   Socioeconomic History   Marital status: Single    Spouse name: Not on file    Number of children: 0   Years of education: Not on file   Highest education level: 9th grade  Occupational History   Occupation: unemployed  Tobacco Use   Smoking status: Every Day    Packs/day: 0.50    Years: 27.00    Pack years: 13.50    Types: Cigarettes   Smokeless tobacco: Never  Vaping Use   Vaping Use: Never used  Substance and Sexual Activity   Alcohol use: Yes    Alcohol/week: 12.0 standard drinks    Types: 12 Cans of beer per week    Comment: Daily. "As much as I can get."   Drug use: Yes    Types: Marijuana, "Crack" cocaine    Comment: States been drinking 4-40oz beers daily.   Sexual activity: Not Currently    Partners: Male    Birth control/protection: Condom    Comment: given condoms 01/2021  Other Topics Concern   Not on file  Social History Narrative   ** Merged History Encounter **       ** Merged History Encounter **       Social Determinants of Health   Financial Resource Strain: High Risk   Difficulty of Paying Living Expenses: Very hard  Food Insecurity: Landscape architect Present   Worried About Charity fundraiser in the Last Year: Sometimes true   Arboriculturist in the Last Year: Sometimes true  Transportation Needs: Public librarian (Medical): Yes   Lack of Transportation (Non-Medical): Yes  Physical Activity: Inactive   Days of Exercise per Week: 0 days   Minutes of Exercise per Session: 0 min  Stress: Stress Concern Present   Feeling of Stress : Rather much  Social Connections: Moderately Isolated   Frequency of Communication with Friends and Family: Three times a week   Frequency of Social Gatherings with Friends and Family: Three times a week   Attends Religious Services: Never   Active Member of Clubs or Organizations: Yes   Attends Music therapist: More than 4 times per year   Marital Status: Never married   SDOH:  SDOH Screenings   Alcohol Screen: Medium Risk   Last Alcohol  Screening Score (AUDIT): 31  Depression (PHQ2-9): Low Risk    PHQ-2 Score: 0  Financial Resource Strain: High Risk   Difficulty of Paying Living Expenses: Very hard  Food Insecurity: Food Insecurity Present   Worried About Charity fundraiser in the Last Year: Sometimes true   Ran Out of Food in the Last Year: Sometimes true  Housing: Medium Risk   Last Housing Risk Score: 1  Physical Activity: Inactive   Days of Exercise per Week: 0 days   Minutes of Exercise per Session: 0 min  Social Connections: Moderately Isolated   Frequency of Communication with Friends and Family: Three times a week   Frequency of Social Gatherings with Friends and Family: Three times a week  Attends Religious Services: Never   Active Member of Clubs or Organizations: Yes   Attends Music therapist: More than 4 times per year   Marital Status: Never married  Stress: Stress Concern Present   Feeling of Stress : Rather much  Tobacco Use: High Risk   Smoking Tobacco Use: Every Day   Smokeless Tobacco Use: Never   Passive Exposure: Not on file  Transportation Needs: Unmet Transportation Needs   Lack of Transportation (Medical): Yes   Lack of Transportation (Non-Medical): Yes   Additional Social History:    Pain Medications: See MAR Prescriptions: See MAR Over the Counter: See MAR                   Current Medications:  Current Facility-Administered Medications  Medication Dose Route Frequency Provider Last Rate Last Admin   acetaminophen (TYLENOL) tablet 650 mg  650 mg Oral Q6H PRN Bobbitt, Shalon E, NP       alum & mag hydroxide-simeth (MAALOX/MYLANTA) 200-200-20 MG/5ML suspension 30 mL  30 mL Oral Q4H PRN Bobbitt, Shalon E, NP       busPIRone (BUSPAR) tablet 7.5 mg  7.5 mg Oral BID Bobbitt, Shalon E, NP   7.5 mg at 03/29/21 9509   Darunavir-Cobicistat-Emtricitabine-Tenofovir Alafenamide (SYMTUZA) 800-150-200-10 MG TABS 1 tablet  1 tablet Oral q morning Bobbitt, Shalon E, NP   1  tablet at 03/29/21 1058   magnesium hydroxide (MILK OF MAGNESIA) suspension 30 mL  30 mL Oral Daily PRN Bobbitt, Shalon E, NP       mirtazapine (REMERON) tablet 15 mg  15 mg Oral QHS Bobbitt, Shalon E, NP       QUEtiapine (SEROQUEL) tablet 100 mg  100 mg Oral QHS Bobbitt, Shalon E, NP       traZODone (DESYREL) tablet 100 mg  100 mg Oral QHS Bobbitt, Shalon E, NP       Current Outpatient Medications  Medication Sig Dispense Refill   albuterol (VENTOLIN HFA) 108 (90 Base) MCG/ACT inhaler Inhale 1-2 puffs into the lungs every 6 (six) hours as needed for wheezing or shortness of breath. 6.7 g 2   busPIRone (BUSPAR) 7.5 MG tablet Take 1 tablet (7.5 mg total) by mouth 2 (two) times daily. 60 tablet 1   mirtazapine (REMERON) 15 MG tablet Take 1 tablet (15 mg total) by mouth at bedtime. 30 tablet 1   QUEtiapine (SEROQUEL) 100 MG tablet Take 1 tablet (100 mg total) by mouth at bedtime. 30 tablet 1   SYMTUZA 800-150-200-10 MG TABS Take 1 tablet by mouth every morning. 30 tablet 5   traZODone (DESYREL) 100 MG tablet Take 100 mg by mouth at bedtime.      Labs  Lab Results:  Admission on 03/29/2021  Component Date Value Ref Range Status   SARS Coronavirus 2 by RT PCR 03/29/2021 NEGATIVE  NEGATIVE Final   Comment: (NOTE) SARS-CoV-2 target nucleic acids are NOT DETECTED.  The SARS-CoV-2 RNA is generally detectable in upper respiratory specimens during the acute phase of infection. The lowest concentration of SARS-CoV-2 viral copies this assay can detect is 138 copies/mL. A negative result does not preclude SARS-Cov-2 infection and should not be used as the sole basis for treatment or other patient management decisions. A negative result may occur with  improper specimen collection/handling, submission of specimen other than nasopharyngeal swab, presence of viral mutation(s) within the areas targeted by this assay, and inadequate number of viral copies(<138 copies/mL). A negative result must be  combined  with clinical observations, patient history, and epidemiological information. The expected result is Negative.  Fact Sheet for Patients:  EntrepreneurPulse.com.au  Fact Sheet for Healthcare Providers:  IncredibleEmployment.be  This test is no                          t yet approved or cleared by the [place] FDA and  has been authorized for detection and/or diagnosis of SARS-CoV-2 by FDA under an Emergency Use Authorization (EUA). This EUA will remain  in effect (meaning this test can be used) for the duration of the COVID-19 declaration under Section 564(b)(1) of the Act, 21 U.S.C.section 360bbb-3(b)(1), unless the authorization is terminated  or revoked sooner.       Influenza A by PCR 03/29/2021 NEGATIVE  NEGATIVE Final   Influenza B by PCR 03/29/2021 NEGATIVE  NEGATIVE Final   Comment: (NOTE) The Xpert Xpress SARS-CoV-2/FLU/RSV plus assay is intended as an aid in the diagnosis of influenza from Nasopharyngeal swab specimens and should not be used as a sole basis for treatment. Nasal washings and aspirates are unacceptable for Xpert Xpress SARS-CoV-2/FLU/RSV testing.  Fact Sheet for Patients: EntrepreneurPulse.com.au  Fact Sheet for Healthcare Providers: IncredibleEmployment.be  This test is not yet approved or cleared by the [place] FDA and has been authorized for detection and/or diagnosis of SARS-CoV-2 by FDA under an Emergency Use Authorization (EUA). This EUA will remain in effect (meaning this test can be used) for the duration of the COVID-19 declaration under Section 564(b)(1) of the Act, 21 U.S.C. section 360bbb-3(b)(1), unless the authorization is terminated or revoked.  Performed at [facility], [place] [address], [place], [place] [address]    WBC 03/29/2021 4.6  4.0 - 10.5 K/uL Final   RBC 03/29/2021 4.07 (L)  4.22 - 5.81 MIL/uL Final   Hemoglobin 03/29/2021  13.4  13.0 - 17.0 g/dL Final   HCT 03/29/2021 38.6 (L)  39.0 - 52.0 % Final   MCV 03/29/2021 94.8  80.0 - 100.0 fL Final   MCH 03/29/2021 32.9  26.0 - 34.0 pg Final   MCHC 03/29/2021 34.7  30.0 - 36.0 g/dL Final   RDW 03/29/2021 12.1  11.5 - 15.5 % Final   Platelets 03/29/2021 397  150 - 400 K/uL Final   REPEATED TO VERIFY   nRBC 03/29/2021 0.0  0.0 - 0.2 % Final   Neutrophils Relative % 03/29/2021 27  % Final   Neutro Abs 03/29/2021 1.2 (L)  1.7 - 7.7 K/uL Final   Lymphocytes Relative 03/29/2021 48  % Final   Lymphs Abs 03/29/2021 2.2  0.7 - 4.0 K/uL Final   Monocytes Relative 03/29/2021 14  % Final   Monocytes Absolute 03/29/2021 0.7  0.1 - 1.0 K/uL Final   Eosinophils Relative 03/29/2021 10  % Final   Eosinophils Absolute 03/29/2021 0.4  0.0 - 0.5 K/uL Final   Basophils Relative 03/29/2021 1  % Final   Basophils Absolute 03/29/2021 0.0  0.0 - 0.1 K/uL Final   Immature Granulocytes 03/29/2021 0  % Final   Abs Immature Granulocytes 03/29/2021 0.00  0.00 - 0.07 K/uL Final   Performed at [facility], [place] [address] [place], [place] [address]   Magnesium 03/29/2021 2.1  1.7 - 2.4 mg/dL Final   Performed at [facility] [address], [place],  [address]   Alcohol, Ethyl (B) 03/29/2021 <10  <10 mg/dL Final   Comment: (NOTE) Lowest detectable limit for  serum alcohol is 10 mg/dL.  For medical purposes only. Performed at [facility], [place] [address] [place], [place] [address]    Cholesterol 03/29/2021 158  0 - 200 mg/dL Final   Triglycerides 03/29/2021 73  <150 mg/dL Final   HDL 03/29/2021 70  >40 mg/dL Final   Total CHOL/HDL Ratio 03/29/2021 2.3  RATIO Final   VLDL 03/29/2021 15  0 - 40 mg/dL Final   LDL Cholesterol 03/29/2021 73  0 - 99 mg/dL Final   Comment:        Total Cholesterol/HDL:CHD Risk Coronary Heart Disease Risk Table                     Men   Women  1/2 Average Risk   3.4   3.3  Average Risk       5.0   4.4  2 X Average Risk   9.6    7.1  3 X Average Risk  23.4   11.0        Use the calculated Patient Ratio above and the CHD Risk Table to determine the patient's CHD Risk.        ATP III CLASSIFICATION (LDL):  <100     mg/dL   Optimal  100-129  mg/dL   Near or Above                    Optimal  130-159  mg/dL   Borderline  160-189  mg/dL   High  >190     mg/dL   Very High Performed at [place] [address] [place], [place] [address]    TSH 03/29/2021 2.169  0.350 - 4.500 uIU/mL Final   Comment: Performed by a 3rd Generation assay with a functional sensitivity of <=0.01 uIU/mL. Performed at [facility], [place] [address], [place], [place] [address]    Sodium 03/29/2021 140  135 - 145 mmol/L Final   Potassium 03/29/2021 4.2  3.5 - 5.1 mmol/L Final   Chloride 03/29/2021 105  98 - 111 mmol/L Final   CO2 03/29/2021 28  22 - 32 mmol/L Final   Glucose, Bld 03/29/2021 71  70 - 99 mg/dL Final   Glucose reference range applies only to samples taken after fasting for at least 8 hours.   BUN 03/29/2021 16  6 - 20 mg/dL Final   Creatinine, Ser 03/29/2021 0.90  0.61 - 1.24 mg/dL Final   Calcium 03/29/2021 8.9  8.9 - 10.3 mg/dL Final   Total Protein 03/29/2021 6.6  6.5 - 8.1 g/dL Final   Albumin 03/29/2021 3.6  3.5 - 5.0 g/dL Final   AST 03/29/2021 17  15 - 41 U/L Final   ALT 03/29/2021 11  0 - 44 U/L Final   Alkaline Phosphatase 03/29/2021 100  38 - 126 U/L Final   Total Bilirubin 03/29/2021 0.4  0.3 - 1.2 mg/dL Final   GFR, Estimated 03/29/2021 >60  >60 mL/min Final   Comment: (NOTE) Calculated using the CKD-EPI Creatinine Equation (2021)    Anion gap 03/29/2021 7  5 - 15 Final   Performed at [facility] [address], [place], [place] [address]   POC Amphetamine UR 03/29/2021 None Detected  NONE DETECTED (Cut Off Level 1000 ng/mL) Final   POC Secobarbital (BAR) 03/29/2021 None Detected  NONE DETECTED (Cut Off Level 300 ng/mL) Final   POC Buprenorphine (BUP) 03/29/2021 None Detected  NONE  DETECTED (Cut Off Level  10 ng/mL) Final   POC Oxazepam (BZO) 03/29/2021 None Detected  NONE DETECTED (Cut Off Level 300 ng/mL) Final   POC Cocaine UR 03/29/2021 Positive (A)  NONE DETECTED (Cut Off Level 300 ng/mL) Final   POC Methamphetamine UR 03/29/2021 None Detected  NONE DETECTED (Cut Off Level 1000 ng/mL) Final   POC Morphine 03/29/2021 None Detected  NONE DETECTED (Cut Off Level 300 ng/mL) Final   POC Oxycodone UR 03/29/2021 None Detected  NONE DETECTED (Cut Off Level 100 ng/mL) Final   POC Methadone UR 03/29/2021 None Detected  NONE DETECTED (Cut Off Level 300 ng/mL) Final   POC Marijuana UR 03/29/2021 Positive (A)  NONE DETECTED (Cut Off Level 50 ng/mL) Final   SARS Coronavirus 2 Ag 03/29/2021 Negative  Negative Preliminary   SARSCOV2ONAVIRUS 2 AG 03/29/2021 NEGATIVE  NEGATIVE Final   Comment: (NOTE) SARS-CoV-2 antigen NOT DETECTED.   Negative results are presumptive.  Negative results do not preclude SARS-CoV-2 infection and should not be used as the sole basis for treatment or other patient management decisions, including infection  control decisions, particularly in the presence of clinical signs and  symptoms consistent with COVID-19, or in those who have been in contact with the virus.  Negative results must be combined with clinical observations, patient history, and epidemiological information. The expected result is Negative.  Fact Sheet for Patients: HandmadeRecipes.com.cy  Fact Sheet for Healthcare Providers: FuneralLife.at  This test is not yet approved or cleared by the [place] FDA and  has been authorized for detection and/or diagnosis of SARS-CoV-2 by FDA under an Emergency Use Authorization (EUA).  This EUA will remain in effect (meaning this test can be used) for the duration of  the COV                          ID-19 declaration under Section 564(b)(1) of the Act, 21 U.S.C. section 360bbb-3(b)(1), unless  the authorization is terminated or revoked sooner.    Admission on 03/09/2021, Discharged on 03/10/2021  Component Date Value Ref Range Status   Alcohol, Ethyl (B) 03/09/2021 <10  <10 mg/dL Final   Comment: (NOTE) Lowest detectable limit for serum alcohol is 10 mg/dL.  For medical purposes only. Performed at [facility], [place] [address], [place], [place] [address]    Opiates 03/09/2021 NONE DETECTED  NONE DETECTED Final   Cocaine 03/09/2021 POSITIVE (A)  NONE DETECTED Final   Benzodiazepines 03/09/2021 NONE DETECTED  NONE DETECTED Final   Amphetamines 03/09/2021 NONE DETECTED  NONE DETECTED Final   Tetrahydrocannabinol 03/09/2021 POSITIVE (A)  NONE DETECTED Final   Barbiturates 03/09/2021 NONE DETECTED  NONE DETECTED Final   Comment: (NOTE) DRUG SCREEN FOR MEDICAL PURPOSES ONLY.  IF CONFIRMATION IS NEEDED FOR ANY PURPOSE, NOTIFY LAB WITHIN 5 DAYS.  LOWEST DETECTABLE LIMITS FOR URINE DRUG SCREEN Drug Class                     Cutoff (ng/mL) Amphetamine and metabolites    1000 Barbiturate and metabolites    200 Benzodiazepine                 263 Tricyclics and metabolites     300 Opiates and metabolites        300 Cocaine and metabolites        300 THC  50 Performed at [facility], [place] [address] [place], [place] [address]    Salicylate Lvl 85/27/7824 <7.0 (L)  7.0 - 30.0 mg/dL Final   Performed at [facility] [address] [place], [place] [address]   Acetaminophen (Tylenol), Serum 03/09/2021 <10 (L)  10 - 30 ug/mL Final   Comment: (NOTE) Therapeutic concentrations vary significantly. A range of 10-30 ug/mL  may be an effective concentration for many patients. However, some  are best treated at concentrations outside of this range. Acetaminophen concentrations >150 ug/mL at 4 hours after ingestion  and >50 ug/mL at 12 hours after ingestion are often associated with  toxic  reactions.  Performed at [facility], [place] [address], [place], [place] [address]    WBC 03/09/2021 5.9  4.0 - 10.5 K/uL Final   RBC 03/09/2021 4.19 (L)  4.22 - 5.81 MIL/uL Final   Hemoglobin 03/09/2021 13.3  13.0 - 17.0 g/dL Final   HCT 03/09/2021 41.0  39.0 - 52.0 % Final   MCV 03/09/2021 97.9  80.0 - 100.0 fL Final   MCH 03/09/2021 31.7  26.0 - 34.0 pg Final   MCHC 03/09/2021 32.4  30.0 - 36.0 g/dL Final   RDW 03/09/2021 12.4  11.5 - 15.5 % Final   Platelets 03/09/2021 423 (H)  150 - 400 K/uL Final   nRBC 03/09/2021 0.0  0.0 - 0.2 % Final   Neutrophils Relative % 03/09/2021 55  % Final   Neutro Abs 03/09/2021 3.3  1.7 - 7.7 K/uL Final   Lymphocytes Relative 03/09/2021 28  % Final   Lymphs Abs 03/09/2021 1.6  0.7 - 4.0 K/uL Final   Monocytes Relative 03/09/2021 10  % Final   Monocytes Absolute 03/09/2021 0.6  0.1 - 1.0 K/uL Final   Eosinophils Relative 03/09/2021 6  % Final   Eosinophils Absolute 03/09/2021 0.3  0.0 - 0.5 K/uL Final   Basophils Relative 03/09/2021 1  % Final   Basophils Absolute 03/09/2021 0.0  0.0 - 0.1 K/uL Final   Immature Granulocytes 03/09/2021 0  % Final   Abs Immature Granulocytes 03/09/2021 0.02  0.00 - 0.07 K/uL Final   Performed at [facility], [place] [address] [place], [place] [address]   Sodium 03/09/2021 139  135 - 145 mmol/L Final   Potassium 03/09/2021 4.2  3.5 - 5.1 mmol/L Final   Chloride 03/09/2021 107  98 - 111 mmol/L Final   CO2 03/09/2021 24  22 - 32 mmol/L Final   Glucose, Bld 03/09/2021 96  70 - 99 mg/dL Final   Glucose reference range applies only to samples taken after fasting for at least 8 hours.   BUN 03/09/2021 23 (H)  6 - 20 mg/dL Final   Creatinine, Ser 03/09/2021 1.20  0.61 - 1.24 mg/dL Final   Calcium 03/09/2021 8.6 (L)  8.9 - 10.3 mg/dL Final   Total Protein 03/09/2021 7.2  6.5 - 8.1 g/dL Final   Albumin 03/09/2021 3.6  3.5 - 5.0 g/dL Final   AST 03/09/2021 21  15 - 41 U/L Final   ALT  03/09/2021 14  0 - 44 U/L Final   Alkaline Phosphatase 03/09/2021 85  38 - 126 U/L Final   Total Bilirubin 03/09/2021 0.5  0.3 - 1.2 mg/dL Final   GFR, Estimated 03/09/2021 >60  >60 mL/min Final   Comment: (NOTE) Calculated using the CKD-EPI Creatinine Equation (2021)    Anion gap 03/09/2021 8  5 - 15 Final  Performed at [facility], [place] [address] [place], [place] [address]   SARS Coronavirus 2 by RT PCR 03/09/2021 NEGATIVE  NEGATIVE Final   Comment: (NOTE) SARS-CoV-2 target nucleic acids are NOT DETECTED.  The SARS-CoV-2 RNA is generally detectable in upper respiratory specimens during the acute phase of infection. The lowest concentration of SARS-CoV-2 viral copies this assay can detect is 138 copies/mL. A negative result does not preclude SARS-Cov-2 infection and should not be used as the sole basis for treatment or other patient management decisions. A negative result may occur with  improper specimen collection/handling, submission of specimen other than nasopharyngeal swab, presence of viral mutation(s) within the areas targeted by this assay, and inadequate number of viral copies(<138 copies/mL). A negative result must be combined with clinical observations, patient history, and epidemiological information. The expected result is Negative.  Fact Sheet for Patients:  EntrepreneurPulse.com.au  Fact Sheet for Healthcare Providers:  IncredibleEmployment.be  This test is no                          t yet approved or cleared by the [place] FDA and  has been authorized for detection and/or diagnosis of SARS-CoV-2 by FDA under an Emergency Use Authorization (EUA). This EUA will remain  in effect (meaning this test can be used) for the duration of the COVID-19 declaration under Section 564(b)(1) of the Act, 21 U.S.C.section 360bbb-3(b)(1), unless the authorization is terminated  or revoked sooner.       Influenza  A by PCR 03/09/2021 NEGATIVE  NEGATIVE Final   Influenza B by PCR 03/09/2021 NEGATIVE  NEGATIVE Final   Comment: (NOTE) The Xpert Xpress SARS-CoV-2/FLU/RSV plus assay is intended as an aid in the diagnosis of influenza from Nasopharyngeal swab specimens and should not be used as a sole basis for treatment. Nasal washings and aspirates are unacceptable for Xpert Xpress SARS-CoV-2/FLU/RSV testing.  Fact Sheet for Patients: EntrepreneurPulse.com.au  Fact Sheet for Healthcare Providers: IncredibleEmployment.be  This test is not yet approved or cleared by the [place] FDA and has been authorized for detection and/or diagnosis of SARS-CoV-2 by FDA under an Emergency Use Authorization (EUA). This EUA will remain in effect (meaning this test can be used) for the duration of the COVID-19 declaration under Section 564(b)(1) of the Act, 21 U.S.C. section 360bbb-3(b)(1), unless the authorization is terminated or revoked.  Performed at [facility], [place] [address], [place], [place] [address]   Office Visit on 02/10/2021  Component Date Value Ref Range Status   HIV 1 RNA Quant 02/10/2021 Not Detected  Copies/mL Final   HIV-1 RNA Quant, Log 02/10/2021 Not Detected  Log cps/mL Final   Comment: . Reference Range:                           Not Detected     copies/mL                           Not Detected Log copies/mL . Marland Kitchen The test was performed using Real-Time Polymerase Chain Reaction. . . Reportable Range: 20 copies/mL to 10,000,000 copies/mL (1.30 Log copies/mL to 7.00 Log copies/mL). .    CD4 T Cell Abs 02/10/2021 661  400 - 1,790 /uL Final   CD4 % Helper T Cell 02/10/2021 40  33 - 65 % Final   Performed at [facility], [address] [place], [place] [address]   RPR Ser Ql 02/10/2021 NON-REACTIVE  NON-REACTIVE Final  Admission on 01/25/2021, Discharged on 01/25/2021  Component Date Value Ref Range  Status   Sodium 01/25/2021 138  135 - 145 mmol/L Final   Potassium 01/25/2021 4.0  3.5 - 5.1 mmol/L Final   Chloride 01/25/2021 106  98 - 111 mmol/L Final   CO2 01/25/2021 22  22 - 32 mmol/L Final   Glucose, Bld 01/25/2021 93  70 - 99 mg/dL Final   Glucose reference range applies only to samples taken after fasting for at least 8 hours.   BUN 01/25/2021 15  6 - 20 mg/dL Final   Creatinine, Ser 01/25/2021 0.92  0.61 - 1.24 mg/dL Final   Calcium 01/25/2021 8.6 (L)  8.9 - 10.3 mg/dL Final   GFR, Estimated 01/25/2021 >60  >60 mL/min Final   Comment: (NOTE) Calculated using the CKD-EPI Creatinine Equation (2021)    Anion gap 01/25/2021 10  5 - 15 Final   Performed at [facility], [place] [address] [place], [place] [address]   WBC 01/25/2021 5.4  4.0 - 10.5 K/uL Final   RBC 01/25/2021 4.26  4.22 - 5.81 MIL/uL Final   Hemoglobin 01/25/2021 13.8  13.0 - 17.0 g/dL Final   HCT 01/25/2021 41.4  39.0 - 52.0 % Final   MCV 01/25/2021 97.2  80.0 - 100.0 fL Final   MCH 01/25/2021 32.4  26.0 - 34.0 pg Final   MCHC 01/25/2021 33.3  30.0 - 36.0 g/dL Final   RDW 01/25/2021 12.8  11.5 - 15.5 % Final   Platelets 01/25/2021 373  150 - 400 K/uL Final   nRBC 01/25/2021 0.0  0.0 - 0.2 % Final   Neutrophils Relative % 01/25/2021 30  % Final   Neutro Abs 01/25/2021 1.6 (L)  1.7 - 7.7 K/uL Final   Lymphocytes Relative 01/25/2021 54  % Final   Lymphs Abs 01/25/2021 2.9  0.7 - 4.0 K/uL Final   Monocytes Relative 01/25/2021 11  % Final   Monocytes Absolute 01/25/2021 0.6  0.1 - 1.0 K/uL Final   Eosinophils Relative 01/25/2021 5  % Final   Eosinophils Absolute 01/25/2021 0.3  0.0 - 0.5 K/uL Final   Basophils Relative 01/25/2021 0  % Final   Basophils Absolute 01/25/2021 0.0  0.0 - 0.1 K/uL Final   Immature Granulocytes 01/25/2021 0  % Final   Abs Immature Granulocytes 01/25/2021 0.01  0.00 - 0.07 K/uL Final   Performed at [facility], [place] [address], [place], [place] [address]   B Natriuretic  Peptide 01/25/2021 31.2  0.0 - 100.0 pg/mL Final   Performed at [place] [address], [place], [place] [address]  Admission on 12/23/2020, Discharged on 12/23/2020  Component Date Value Ref Range Status   Hgb A1c MFr Bld 12/23/2020 5.2  4.8 - 5.6 % Final   Comment: (NOTE) Pre diabetes:          5.7%-6.4%  Diabetes:              >6.4%  Glycemic control for   <7.0% adults with diabetes    Mean Plasma Glucose 12/23/2020 102.54  mg/dL Final   Performed at [place] [address], [place], [place] [address]  Admission on 12/22/2020, Discharged on 12/22/2020  Component Date Value Ref Range Status   Sodium 12/22/2020 141  135 - 145 mmol/L Final   Potassium 12/22/2020 3.9  3.5 - 5.1  mmol/L Final   Chloride 12/22/2020 106  98 - 111 mmol/L Final   CO2 12/22/2020 24  22 - 32 mmol/L Final   Glucose, Bld 12/22/2020 104 (H)  70 - 99 mg/dL Final   Glucose reference range applies only to samples taken after fasting for at least 8 hours.   BUN 12/22/2020 16  6 - 20 mg/dL Final   Creatinine, Ser 12/22/2020 1.02  0.61 - 1.24 mg/dL Final   Calcium 12/22/2020 9.5  8.9 - 10.3 mg/dL Final   Total Protein 12/22/2020 7.8  6.5 - 8.1 g/dL Final   Albumin 12/22/2020 4.4  3.5 - 5.0 g/dL Final   AST 12/22/2020 18  15 - 41 U/L Final   ALT 12/22/2020 13  0 - 44 U/L Final   Alkaline Phosphatase 12/22/2020 68  38 - 126 U/L Final   Total Bilirubin 12/22/2020 0.9  0.3 - 1.2 mg/dL Final   GFR, Estimated 12/22/2020 >60  >60 mL/min Final   Comment: (NOTE) Calculated using the CKD-EPI Creatinine Equation (2021)    Anion gap 12/22/2020 11  5 - 15 Final   Performed at [facility], [place] [address], [place], [place] [address]   Alcohol, Ethyl (B) 12/22/2020 <10  <10 mg/dL Final   Performed at [place] [address], [place], [place] [address]   Salicylate Lvl 01/60/1093 <7.0 (L)  7.0 - 30.0 mg/dL Final   Performed at [place] [address] [place], [place] [address]   Acetaminophen (Tylenol), Serum 12/22/2020 <10 (L)  10 - 30 ug/mL Final   Performed at [facility], [place] [address] [place], [place] [address]   WBC 12/22/2020 5.1  4.0 - 10.5 K/uL Final   RBC 12/22/2020 4.60  4.22 - 5.81 MIL/uL Final   Hemoglobin 12/22/2020 14.9  13.0 - 17.0 g/dL Final   HCT 12/22/2020 46.0  39.0 - 52.0 % Final   MCV 12/22/2020 100.0  80.0 - 100.0 fL Final   MCH 12/22/2020 32.4  26.0 - 34.0 pg Final   MCHC 12/22/2020 32.4  30.0 - 36.0 g/dL Final   RDW 12/22/2020 13.0  11.5 - 15.5 % Final   Platelets 12/22/2020 383  150 - 400 K/uL Final   nRBC 12/22/2020 0.0  0.0 - 0.2 % Final   Performed at [facility], [place] [address], [place], [place] [address]   Opiates 12/22/2020 NONE DETECTED  NONE DETECTED Final   Cocaine 12/22/2020 POSITIVE (A)  NONE DETECTED Final   Benzodiazepines 12/22/2020 NONE DETECTED  NONE DETECTED Final   Amphetamines 12/22/2020 NONE DETECTED  NONE DETECTED Final   Tetrahydrocannabinol 12/22/2020 POSITIVE (A)  NONE DETECTED Final   Barbiturates 12/22/2020 NONE DETECTED  NONE DETECTED Final   Comment: (NOTE) DRUG SCREEN FOR MEDICAL PURPOSES ONLY.  IF CONFIRMATION IS NEEDED FOR ANY PURPOSE, NOTIFY LAB WITHIN 5 DAYS.  LOWEST DETECTABLE LIMITS FOR URINE DRUG SCREEN Drug Class                     Cutoff (ng/mL) Amphetamine and metabolites    1000 Barbiturate and metabolites    200 Benzodiazepine                 623 Tricyclics and metabolites     300 Opiates and metabolites        300 Cocaine and metabolites        300 THC  50 Performed at [facility], [place] [address] [place], [place] [address]    SARS Coronavirus 2 by RT PCR 12/22/2020 NEGATIVE  NEGATIVE Final   Comment: (NOTE) SARS-CoV-2 target nucleic acids are NOT DETECTED.  The SARS-CoV-2 RNA is generally detectable in upper respiratory specimens during the acute  phase of infection. The lowest concentration of SARS-CoV-2 viral copies this assay can detect is 138 copies/mL. A negative result does not preclude SARS-Cov-2 infection and should not be used as the sole basis for treatment or other patient management decisions. A negative result may occur with  improper specimen collection/handling, submission of specimen other than nasopharyngeal swab, presence of viral mutation(s) within the areas targeted by this assay, and inadequate number of viral copies(<138 copies/mL). A negative result must be combined with clinical observations, patient history, and epidemiological information. The expected result is Negative.  Fact Sheet for Patients:  EntrepreneurPulse.com.au  Fact Sheet for Healthcare Providers:  IncredibleEmployment.be  This test is no                          t yet approved or cleared by the [place] FDA and  has been authorized for detection and/or diagnosis of SARS-CoV-2 by FDA under an Emergency Use Authorization (EUA). This EUA will remain  in effect (meaning this test can be used) for the duration of the COVID-19 declaration under Section 564(b)(1) of the Act, 21 U.S.C.section 360bbb-3(b)(1), unless the authorization is terminated  or revoked sooner.       Influenza A by PCR 12/22/2020 NEGATIVE  NEGATIVE Final   Influenza B by PCR 12/22/2020 NEGATIVE  NEGATIVE Final   Comment: (NOTE) The Xpert Xpress SARS-CoV-2/FLU/RSV plus assay is intended as an aid in the diagnosis of influenza from Nasopharyngeal swab specimens and should not be used as a sole basis for treatment. Nasal washings and aspirates are unacceptable for Xpert Xpress SARS-CoV-2/FLU/RSV testing.  Fact Sheet for Patients: EntrepreneurPulse.com.au  Fact Sheet for Healthcare Providers: IncredibleEmployment.be  This test is not yet approved or cleared by the [place] FDA and has  been authorized for detection and/or diagnosis of SARS-CoV-2 by FDA under an Emergency Use Authorization (EUA). This EUA will remain in effect (meaning this test can be used) for the duration of the COVID-19 declaration under Section 564(b)(1) of the Act, 21 U.S.C. section 360bbb-3(b)(1), unless the authorization is terminated or revoked.  Performed at [facility], [place] [address], [place], [place] [address]   Admission on 11/19/2020, Discharged on 11/24/2020  Component Date Value Ref Range Status   SARS Coronavirus 2 by RT PCR 11/19/2020 NEGATIVE  NEGATIVE Final   Comment: (NOTE) SARS-CoV-2 target nucleic acids are NOT DETECTED.  The SARS-CoV-2 RNA is generally detectable in upper respiratory specimens during the acute phase of infection. The lowest concentration of SARS-CoV-2 viral copies this assay can detect is 138 copies/mL. A negative result does not preclude SARS-Cov-2 infection and should not be used as the sole basis for treatment or other patient management decisions. A negative result may occur with  improper specimen collection/handling, submission of specimen other than nasopharyngeal swab, presence of viral mutation(s) within the areas targeted by this assay, and inadequate number of viral copies(<138 copies/mL). A negative result must be combined with clinical observations, patient history, and epidemiological information. The expected result is Negative.  Fact Sheet for Patients:  EntrepreneurPulse.com.au  Fact Sheet for Healthcare Providers:  IncredibleEmployment.be  This test is no  t yet approved or cleared by the [place] and  has been authorized for detection and/or diagnosis of SARS-CoV-2 by FDA under an Emergency Use Authorization (EUA). This EUA will remain  in effect (meaning this test can be used) for the duration of the COVID-19 declaration under Section 564(b)(1)  of the Act, 21 U.S.C.section 360bbb-3(b)(1), unless the authorization is terminated  or revoked sooner.       Influenza A by PCR 11/19/2020 NEGATIVE  NEGATIVE Final   Influenza B by PCR 11/19/2020 NEGATIVE  NEGATIVE Final   Comment: (NOTE) The Xpert Xpress SARS-CoV-2/FLU/RSV plus assay is intended as an aid in the diagnosis of influenza from Nasopharyngeal swab specimens and should not be used as a sole basis for treatment. Nasal washings and aspirates are unacceptable for Xpert Xpress SARS-CoV-2/FLU/RSV testing.  Fact Sheet for Patients: EntrepreneurPulse.com.au  Fact Sheet for Healthcare Providers: IncredibleEmployment.be  This test is not yet approved or cleared by the [place] FDA and has been authorized for detection and/or diagnosis of SARS-CoV-2 by FDA under an Emergency Use Authorization (EUA). This EUA will remain in effect (meaning this test can be used) for the duration of the COVID-19 declaration under Section 564(b)(1) of the Act, 21 U.S.C. section 360bbb-3(b)(1), unless the authorization is terminated or revoked.  Performed at [facility], [place] [address], [place], [place] [address]    WBC 11/19/2020 5.0  4.0 - 10.5 K/uL Final   RBC 11/19/2020 4.15 (L)  4.22 - 5.81 MIL/uL Final   Hemoglobin 11/19/2020 13.4  13.0 - 17.0 g/dL Final   HCT 11/19/2020 39.9  39.0 - 52.0 % Final   MCV 11/19/2020 96.1  80.0 - 100.0 fL Final   MCH 11/19/2020 32.3  26.0 - 34.0 pg Final   MCHC 11/19/2020 33.6  30.0 - 36.0 g/dL Final   RDW 11/19/2020 12.6  11.5 - 15.5 % Final   Platelets 11/19/2020 360  150 - 400 K/uL Final   nRBC 11/19/2020 0.0  0.0 - 0.2 % Final   Neutrophils Relative % 11/19/2020 42  % Final   Neutro Abs 11/19/2020 2.1  1.7 - 7.7 K/uL Final   Lymphocytes Relative 11/19/2020 39  % Final   Lymphs Abs 11/19/2020 1.9  0.7 - 4.0 K/uL Final   Monocytes Relative 11/19/2020 13  % Final   Monocytes Absolute 11/19/2020 0.7  0.1 -  1.0 K/uL Final   Eosinophils Relative 11/19/2020 5  % Final   Eosinophils Absolute 11/19/2020 0.2  0.0 - 0.5 K/uL Final   Basophils Relative 11/19/2020 1  % Final   Basophils Absolute 11/19/2020 0.0  0.0 - 0.1 K/uL Final   Immature Granulocytes 11/19/2020 0  % Final   Abs Immature Granulocytes 11/19/2020 0.02  0.00 - 0.07 K/uL Final   Performed at [facility], [place] [address], [place], [place] [address]   Sodium 11/19/2020 137  135 - 145 mmol/L Final   Potassium 11/19/2020 4.1  3.5 - 5.1 mmol/L Final   Chloride 11/19/2020 103  98 - 111 mmol/L Final   CO2 11/19/2020 24  22 - 32 mmol/L Final   Glucose, Bld 11/19/2020 81  70 - 99 mg/dL Final   Glucose reference range applies only to samples taken after fasting for at least 8 hours.   BUN 11/19/2020 9  6 - 20 mg/dL Final   Creatinine, Ser 11/19/2020 0.94  0.61 - 1.24 mg/dL Final   Calcium 11/19/2020 9.3  8.9 - 10.3 mg/dL Final  Total Protein 11/19/2020 7.0  6.5 - 8.1 g/dL Final   Albumin 11/19/2020 4.0  3.5 - 5.0 g/dL Final   AST 11/19/2020 24  15 - 41 U/L Final   ALT 11/19/2020 17  0 - 44 U/L Final   Alkaline Phosphatase 11/19/2020 74  38 - 126 U/L Final   Total Bilirubin 11/19/2020 0.6  0.3 - 1.2 mg/dL Final   GFR, Estimated 11/19/2020 >60  >60 mL/min Final   Comment: (NOTE) Calculated using the CKD-EPI Creatinine Equation (2021)    Anion gap 11/19/2020 10  5 - 15 Final   Performed at [facility], [place] [address] [place], [place] [address]   Alcohol, Ethyl (B) 11/19/2020 <10  <10 mg/dL Final   Comment: (NOTE) Lowest detectable limit for serum alcohol is 10 mg/dL.  For medical purposes only. Performed at [facility], [place] [address] [place], [place] [address]    POC Amphetamine UR 11/20/2020 None Detected  NONE DETECTED (Cut Off Level 1000 ng/mL) Final   POC Secobarbital (BAR) 11/20/2020 None Detected  NONE DETECTED (Cut Off Level 300 ng/mL) Final   POC Buprenorphine (BUP) 11/20/2020 None Detected  NONE  DETECTED (Cut Off Level 10 ng/mL) Final   POC Oxazepam (BZO) 11/20/2020 None Detected  NONE DETECTED (Cut Off Level 300 ng/mL) Final   POC Cocaine UR 11/20/2020 Positive (A)  NONE DETECTED (Cut Off Level 300 ng/mL) Final   POC Methamphetamine UR 11/20/2020 None Detected  NONE DETECTED (Cut Off Level 1000 ng/mL) Final   POC Morphine 11/20/2020 None Detected  NONE DETECTED (Cut Off Level 300 ng/mL) Final   POC Oxycodone UR 11/20/2020 None Detected  NONE DETECTED (Cut Off Level 100 ng/mL) Final   POC Methadone UR 11/20/2020 None Detected  NONE DETECTED (Cut Off Level 300 ng/mL) Final   POC Marijuana UR 11/20/2020 Positive (A)  NONE DETECTED (Cut Off Level 50 ng/mL) Final   SARS Coronavirus 2 Ag 11/19/2020 Negative  Negative Final   SARSCOV2ONAVIRUS 2 AG 11/19/2020 NEGATIVE  NEGATIVE Final   Comment: (NOTE) SARS-CoV-2 antigen NOT DETECTED.   Negative results are presumptive.  Negative results do not preclude SARS-CoV-2 infection and should not be used as the sole basis for treatment or other patient management decisions, including infection  control decisions, particularly in the presence of clinical signs and  symptoms consistent with COVID-19, or in those who have been in contact with the virus.  Negative results must be combined with clinical observations, patient history, and epidemiological information. The expected result is Negative.  Fact Sheet for Patients: HandmadeRecipes.com.cy  Fact Sheet for Healthcare Providers: FuneralLife.at  This test is not yet approved or cleared by the [place] FDA and  has been authorized for detection and/or diagnosis of SARS-CoV-2 by FDA under an Emergency Use Authorization (EUA).  This EUA will remain in effect (meaning this test can be used) for the duration of  the COV                          ID-19 declaration under Section 564(b)(1) of the Act, 21 U.S.C. section 360bbb-3(b)(1), unless the  authorization is terminated or revoked sooner.    Office Visit on 10/27/2020  Component Date Value Ref Range Status   Glucose, Bld 10/27/2020 87  65 - 99 mg/dL Final   Comment: .            Fasting reference interval .    BUN 10/27/2020 15  7 -  25 mg/dL Final   Creat 10/27/2020 0.98  0.60 - 1.29 mg/dL Final   eGFR 10/27/2020 95  > OR = 60 mL/min/1.64m Final   Comment: The eGFR is based on the CKD-EPI 2021 equation. To calculate  the new eGFR from a previous Creatinine or Cystatin C result, go to https://www.kidney.org/professionals/ kdoqi/gfr%5Fcalculator    BUN/Creatinine Ratio 074/82/7078NOT APPLICABLE  6 - 22 (calc) Final   Sodium 10/27/2020 142  135 - 146 mmol/L Final   Potassium 10/27/2020 4.3  3.5 - 5.3 mmol/L Final   Chloride 10/27/2020 104  98 - 110 mmol/L Final   CO2 10/27/2020 29  20 - 32 mmol/L Final   Calcium 10/27/2020 9.6  8.6 - 10.3 mg/dL Final   Total Protein 10/27/2020 7.3  6.1 - 8.1 g/dL Final   Albumin 10/27/2020 4.3  3.6 - 5.1 g/dL Final   Globulin 10/27/2020 3.0  1.9 - 3.7 g/dL (calc) Final   AG Ratio 10/27/2020 1.4  1.0 - 2.5 (calc) Final   Total Bilirubin 10/27/2020 0.6  0.2 - 1.2 mg/dL Final   Alkaline phosphatase (APISO) 10/27/2020 76  36 - 130 U/L Final   AST 10/27/2020 15  10 - 40 U/L Final   ALT 10/27/2020 11  9 - 46 U/L Final   HIV 1 RNA Quant 10/27/2020 Not Detected  Copies/mL Final   HIV-1 RNA Quant, Log 10/27/2020 Not Detected  Log cps/mL Final   Comment: . Reference Range:                           Not Detected     copies/mL                           Not Detected Log copies/mL . .Marland KitchenThe test was performed using Real-Time Polymerase Chain Reaction. . . Reportable Range: 20 copies/mL to 10,000,000 copies/mL (1.30 Log copies/mL to 7.00 Log copies/mL). .    CD4 T Cell Abs 10/27/2020 462  400 - 1,790 /uL Final   CD4 % Helper T Cell 10/27/2020 44  33 - 65 % Final   [facility] [id number],  [place] [address]  RPR Ser Ql 10/27/2020 NON-REACTIVE  NON-REACTIVE Final    Blood Alcohol level:  Lab Results  Component Value Date   ETH <10 03/29/2021   ETH <10 192/03/69   Metabolic Disorder Labs: Lab Results  Component Value Date   HGBA1C 5.2 12/23/2020   MPG 102.54 12/23/2020   MPG 102.54 09/18/2020   Lab Results  Component Value Date   PROLACTIN 6.7 09/29/2012   Lab Results  Component Value Date   CHOL 158 03/29/2021   TRIG 73 03/29/2021   HDL 70 03/29/2021   CHOLHDL 2.3 03/29/2021   VLDL 15 03/29/2021   LDLCALC 73 03/29/2021   LDLCALC 94 09/18/2020    Therapeutic Lab Levels: No results found for: LITHIUM Lab Results  Component Value Date   VALPROATE 59 04/06/2015   VALPROATE 95 11/18/2014   No components found for:  CBMZ  Physical Findings   AIMS    Flowsheet Row Admission (Discharged) [place] [facility] ([facility]charged) from 03/23/2019 in [facility] Admission (Discharged) from 03/31/2015 in [facility] Admission (Discharged) from [place]14/2014 [place] [place] 0 0 0 0  AUDIT    Flowsheet Row Admission (Discharged) from 09/17/2020 in [place] [facility] Counselor from 05/22/2020 in [facility] Admission (Discharged) from 03/23/2019 in [place] [facility] Admission (Discharged) from 03/31/2015 in [place] [facility] Admission (Discharged) from 11/14/2014 in [place]  Alcohol Use Disorder Identification Test Final Score (AUDIT) 31 32 _0 GAD-7    Flowsheet Row Office Visit from 11/24/2020 in [facility] Office Visit from 10/03/2020 in [facility] Office Visit from 08/01/2020 in [facility] Office Visit from 05/22/2020 in [facility]  Total GAD-7 Score _1 ERX5-4    [place] Office Visit from 02/10/2021 in [facility] Office Visit from 11/24/2020 in [facility] Office Visit from 10/27/2020 in [facility] Office Visit from 10/03/2020 in [facility] Office Visit from 08/07/2020 in [facility]  PHQ-2 Total Score 0 5 0 4 0  PHQ-9 Total Score -- 18 -- 18 --      [place] ED from 03/29/2021 in [facility] ED from 03/09/2021 in [place] DEPT ED from 12/31/2020 in [place] DEPT  C-SSRS RISK CATEGORY High Risk High Risk High Risk        Musculoskeletal  Strength & Muscle Tone: within normal limits Gait & Station: normal Patient leans: N/A  Psychiatric Specialty Exam  Presentation  General Appearance: Disheveled  Eye Contact:Fair  Speech:Clear and Coherent  Speech Volume:Normal  Handedness:Right   Mood and Affect  Mood:Dysphoric  Affect:Congruent   Thought Process  Thought Processes:Coherent; Goal Directed  Descriptions of Associations:Intact  Orientation:Full (Time, Place and Person)  Thought Content:WDL  Diagnosis of Schizophrenia or Schizoaffective disorder in past: Yes  Duration of Psychotic Symptoms: Greater than six months   Hallucinations:Hallucinations: Visual; Auditory Description of Auditory Hallucinations: Hear voices telling him to kill everyone Description of Visual Hallucinations: Sees purple elephants  Ideas of Reference:None  Suicidal Thoughts:Suicidal Thoughts: Yes, Active SI Passive Intent and/or Plan: With Intent; With Plan  Homicidal Thoughts:Homicidal Thoughts: No   Sensorium  Memory:Immediate Fair; Remote  Fair; Recent Fair  Judgment:Poor  Insight:Shallow   Executive Functions  Concentration:Fair  Attention Span:Fair  [place]   Psychomotor Activity  Psychomotor Activity:Psychomotor Activity: Normal   Assets  Assets:Communication Skills; Physical Health   Sleep  Sleep:Sleep: Poor Number of Hours of Sleep: -1   Nutritional Assessment (For OBS and FBC admissions only) Has the patient had a weight loss or gain of 10 pounds or more in the last 3 months?: Yes Has the patient had a decrease in food intake/or appetite?: Yes Does the patient have dental problems?: No Does the patient have eating habits or behaviors that may be indicators of an eating disorder including binging or inducing vomiting?: No Has the patient recently lost weight without trying?: 1 Has the patient been eating poorly because of a decreased appetite?: 1 Malnutrition Screening Tool Score: 2    Physical Exam  Physical Exam Constitutional:      Appearance: Normal appearance.  HENT:     Head: Normocephalic and atraumatic.     Nose: Nose normal.  Eyes:     Conjunctiva/sclera:  Conjunctivae normal.  Cardiovascular:     Rate and Rhythm: Normal rate.  Pulmonary:     Effort: Pulmonary effort is normal.  Musculoskeletal:        General: Normal range of motion.     Cervical back: Normal range of motion.  Neurological:     Mental Status: He is alert and oriented to person, place, and time.   Review of Systems  Constitutional: Negative.   HENT: Negative.    Eyes: Negative.   Respiratory: Negative.    Cardiovascular: Negative.   Gastrointestinal: Negative.   Genitourinary: Negative.   Musculoskeletal: Negative.   Skin: Negative.   Blood pressure 114/68, pulse 98, temperature 98 F (36.7 C), temperature source Oral, resp. rate 16, SpO2 98 %. There is no height or weight on file to calculate BMI.  Treatment Plan Summary: Patient is recommended for  inpatient treatment. Patient faxed out by clinical social worker. Patient is voluntary.  Marissa Calamity, NP 03/29/2021 6:07 PM

## 2021-03-29 NOTE — ED Notes (Signed)
Pt resting quietly. Breathing even and unlabored.  No distress noted.  Awaiting provider's eval and dispo.

## 2021-03-29 NOTE — ED Notes (Signed)
Pt sitting up in bed alert and oriented. Pt states that he has been having thoughts of self harm.  Pt reports that " I have thoughts of  killing myself" when asked how he stated " throwing myself in traffic or jumping off a bridge",  Pt given breakfast.  Awaiting further orders.
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
## 2021-03-30 NOTE — ED Provider Notes (Addendum)
Behavioral Health Progress Note  Date and Time: 03/30/2021 6:36 PM Name: John [name] MRN:  [id number]  Subjective:   John [name], 51 y.o., male patient presented to [facility] on 03/30/2021 with SI/I and AVH.  He was admitted to the continuous assessment unit for overnight observation.  He was assessed and recommended for inpatient psychiatric admission.  [facility] declined patient he has been faxed out.  Patient seen face to face by this provider, consulted with Dr. Serafina Mitchell; and  chart reviewed on 03/30/21.   On evaluation John [name] ask, "why have I not been moved to the other side".  Patient is referring to the [facility].  He was admitted there on 11/19/2020 and discharged on 11/24/2020.  He was admitted to the [facility] continuous observation unit on 12/22/2020 and discharged 01/02/2021. On 03/09/2021 patient was transferred to [place] observation unit from [facility] ED upon arrival patient reported he was having pain and pressure on legs while trying to ambulate and he was sent back to [facility].  Patient has had other visits to emergency departments per chart review.  Reports he has outpatient services with behavioral health on the second floor.  His current medications are Remeron 15 mg nightly, BuSpar 7.5 mg twice daily, and Seroquel 100 mg nightly.  During evaluation John [name] is laying in his bed in no acute distress.  He is alert/oriented x4.  He makes fair eye contact.  Speech is at moderate pace and volume.  He reports depression and anxiety.  His affect is dysphoric.  Continues to report a decrease in sleep and appetite.  He continues to endorse auditory hallucinations of voices that "tell me to kill everyone and kill myself".  He endorses visual hallucinations states, "I see pink elephants and blue balloons".  He endorses suicidal ideations with a plan of "what ever it takes".  He endorses homicidal ideations towards everyone without a plan, intent, or access to means.  He cannot contract for safety.  He  denies paranoia.  Initially patient ask this writer "why have I not been moved to the other side".  He is referencing his admission to [facility] on 11/19/2020.  Explained that he did not meet criteria for treatment in the [facility] and he asked "why not".  He appeared to get agitated.  There appears to be an aspect of malingering. Patient is homeless.      Diagnosis:  Final diagnoses:  Bipolar disorder, current episode depressed, severe, with psychotic features (HCC)  Depression, major, recurrent, severe with psychosis ([place])  Generalized anxiety disorder  Asymptomatic HIV infection, with no history of HIV-related illness ([place])  Insomnia due to other mental disorder    Total Time spent with patient: 30 minutes  Past Psychiatric History: see h&p Past Medical History:  Past Medical History:  Diagnosis Date   Alcohol abuse    Alcoholism ([place])    Anxiety    Asthma    Asthma    Bipolar disorder ([place])    Chronic low back pain    Cocaine abuse ([place])    Depression    Gout    Gout    HIV (human immunodeficiency virus infection) ([place])    "dx'd ~ 2 yr ago" (09/29/2012)   HIV (human immunodeficiency virus infection) ([place])    HIV disease ([place])    Homelessness    Hypertension    Marijuana abuse    Mental disorder    Schizophrenia ([place])     Past Surgical History:  Procedure Laterality Date  SKIN GRAFT FULL THICKNESS LEG Left ?   POSTERIOR LEFT LEG  AFTER DOG BITES   Family History:  Family History  Problem Relation Age of Onset   Alcoholism Mother    Depression Mother    Alcoholism Brother    Family Psychiatric  History: see h&p Social History:  Social History   Substance and Sexual Activity  Alcohol Use Yes   Alcohol/week: 12.0 standard drinks   Types: 12 Cans of beer per week   Comment: Daily. "As much as I can get."     Social History   Substance and Sexual Activity  Drug Use Yes   Types: Marijuana, "Crack" cocaine   Comment: States been drinking 4-40oz beers daily.     Social History   Socioeconomic History   Marital status: Single    Spouse name: Not on file   Number of children: 0   Years of education: Not on file   Highest education level: 9th grade  Occupational History   Occupation: unemployed  Tobacco Use   Smoking status: Every Day    Packs/day: 0.50    Years: 27.00    Pack years: 13.50    Types: Cigarettes   Smokeless tobacco: Never  Vaping Use   Vaping Use: Never used  Substance and Sexual Activity   Alcohol use: Yes    Alcohol/week: 12.0 standard drinks    Types: 12 Cans of beer per week    Comment: Daily. "As much as I can get."   Drug use: Yes    Types: Marijuana, "Crack" cocaine    Comment: States been drinking 4-40oz beers daily.   Sexual activity: Not Currently    Partners: Male    Birth control/protection: Condom    Comment: given condoms 01/2021  Other Topics Concern   Not on file  Social History Narrative   ** Merged History Encounter **       ** Merged History Encounter **       Social Determinants of Health   Financial Resource Strain: High Risk   Difficulty of Paying Living Expenses: Very hard  Food Insecurity: Landscape architect Present   Worried About Charity fundraiser in the Last Year: Sometimes true   Arboriculturist in the Last Year: Sometimes true  Transportation Needs: Public librarian (Medical): Yes   Lack of Transportation (Non-Medical): Yes  Physical Activity: Inactive   Days of Exercise per Week: 0 days   Minutes of Exercise per Session: 0 min  Stress: Stress Concern Present   Feeling of Stress : Rather much  Social Connections: Moderately Isolated   Frequency of Communication with Friends and Family: Three times a week   Frequency of Social Gatherings with Friends and Family: Three times a week   Attends Religious Services: Never   Active Member of Clubs or Organizations: Yes   Attends Music therapist: More than 4 times per year   Marital  Status: Never married   SDOH:  SDOH Screenings   Alcohol Screen: Medium Risk   Last Alcohol Screening Score (AUDIT): 31  Depression (PHQ2-9): Low Risk    PHQ-2 Score: 0  Financial Resource Strain: High Risk   Difficulty of Paying Living Expenses: Very hard  Food Insecurity: Food Insecurity Present   Worried About Charity fundraiser in the Last Year: Sometimes true   Ran Out of Food in the Last Year: Sometimes true  Housing: [place]  Score: 1  Physical Activity: Inactive   Days of Exercise per Week: 0 days   Minutes of Exercise per Session: 0 min  Social Connections: Moderately Isolated   Frequency of Communication with Friends and Family: Three times a week   Frequency of Social Gatherings with Friends and Family: Three times a week   Attends Religious Services: Never   Active Member of Clubs or Organizations: Yes   Attends Music therapist: More than 4 times per year   Marital Status: Never married  Stress: Stress Concern Present   Feeling of Stress : Rather much  Tobacco Use: High Risk   Smoking Tobacco Use: Every Day   Smokeless Tobacco Use: Never   Passive Exposure: Not on file  Transportation Needs: Unmet Transportation Needs   Lack of Transportation (Medical): Yes   Lack of Transportation (Non-Medical): Yes   Additional Social History:    Pain Medications: See MAR Prescriptions: See MAR Over the Counter: See MAR         Sleep: Fair  Appetite:  Fair  Current Medications:  Current Facility-Administered Medications  Medication Dose Route Frequency Provider Last Rate Last Admin   acetaminophen (TYLENOL) tablet 650 mg  650 mg Oral Q6H PRN Bobbitt, Shalon E, NP       alum & mag hydroxide-simeth (MAALOX/MYLANTA) 200-200-20 MG/5ML suspension 30 mL  30 mL Oral Q4H PRN Bobbitt, Shalon E, NP       busPIRone (BUSPAR) tablet 7.5 mg  7.5 mg Oral BID Bobbitt, Shalon E, NP   7.5 mg at 03/30/21 0955    Darunavir-Cobicistat-Emtricitabine-Tenofovir Alafenamide (SYMTUZA) 800-150-200-10 MG TABS 1 tablet  1 tablet Oral q morning Bobbitt, Shalon E, NP   1 tablet at 03/30/21 0955   magnesium hydroxide (MILK OF MAGNESIA) suspension 30 mL  30 mL Oral Daily PRN Bobbitt, Shalon E, NP       mirtazapine (REMERON) tablet 15 mg  15 mg Oral QHS Bobbitt, Shalon E, NP   15 mg at 03/29/21 2120   QUEtiapine (SEROQUEL) tablet 100 mg  100 mg Oral QHS Bobbitt, Shalon E, NP   100 mg at 03/29/21 2120   traZODone (DESYREL) tablet 100 mg  100 mg Oral QHS Bobbitt, Shalon E, NP   100 mg at 03/29/21 2120   Current Outpatient Medications  Medication Sig Dispense Refill   albuterol (VENTOLIN HFA) 108 (90 Base) MCG/ACT inhaler Inhale 1-2 puffs into the lungs every 6 (six) hours as needed for wheezing or shortness of breath. 6.7 g 2   busPIRone (BUSPAR) 7.5 MG tablet Take 1 tablet (7.5 mg total) by mouth 2 (two) times daily. 60 tablet 1   mirtazapine (REMERON) 15 MG tablet Take 1 tablet (15 mg total) by mouth at bedtime. 30 tablet 1   QUEtiapine (SEROQUEL) 100 MG tablet Take 1 tablet (100 mg total) by mouth at bedtime. 30 tablet 1   SYMTUZA 800-150-200-10 MG TABS Take 1 tablet by mouth every morning. 30 tablet 5   traZODone (DESYREL) 100 MG tablet Take 100 mg by mouth at bedtime.      Labs  Lab Results:  Admission on 03/29/2021  Component Date Value Ref Range Status   SARS Coronavirus 2 by RT PCR 03/29/2021 NEGATIVE  NEGATIVE Final   Comment: (NOTE) SARS-CoV-2 target nucleic acids are NOT DETECTED.  The SARS-CoV-2 RNA is generally detectable in upper respiratory specimens during the acute phase of infection. The lowest concentration of SARS-CoV-2 viral copies this assay can detect is 138 copies/mL. A negative  result does not preclude SARS-Cov-2 infection and should not be used as the sole basis for treatment or other patient management decisions. A negative result may occur with  improper specimen collection/handling,  submission of specimen other than nasopharyngeal swab, presence of viral mutation(s) within the areas targeted by this assay, and inadequate number of viral copies(<138 copies/mL). A negative result must be combined with clinical observations, patient history, and epidemiological information. The expected result is Negative.  Fact Sheet for Patients:  EntrepreneurPulse.com.au  Fact Sheet for Healthcare Providers:  IncredibleEmployment.be  This test is no                          t yet approved or cleared by the [place] FDA and  has been authorized for detection and/or diagnosis of SARS-CoV-2 by FDA under an Emergency Use Authorization (EUA). This EUA will remain  in effect (meaning this test can be used) for the duration of the COVID-19 declaration under Section 564(b)(1) of the Act, 21 U.S.C.section 360bbb-3(b)(1), unless the authorization is terminated  or revoked sooner.       Influenza A by PCR 03/29/2021 NEGATIVE  NEGATIVE Final   Influenza B by PCR 03/29/2021 NEGATIVE  NEGATIVE Final   Comment: (NOTE) The Xpert Xpress SARS-CoV-2/FLU/RSV plus assay is intended as an aid in the diagnosis of influenza from Nasopharyngeal swab specimens and should not be used as a sole basis for treatment. Nasal washings and aspirates are unacceptable for Xpert Xpress SARS-CoV-2/FLU/RSV testing.  Fact Sheet for Patients: EntrepreneurPulse.com.au  Fact Sheet for Healthcare Providers: IncredibleEmployment.be  This test is not yet approved or cleared by the [place] FDA and has been authorized for detection and/or diagnosis of SARS-CoV-2 by FDA under an Emergency Use Authorization (EUA). This EUA will remain in effect (meaning this test can be used) for the duration of the COVID-19 declaration under Section 564(b)(1) of the Act, 21 U.S.C. section 360bbb-3(b)(1), unless the authorization is terminated  or revoked.  Performed at [facility], [place] [address], [place], [place] [address]    WBC 03/29/2021 4.6  4.0 - 10.5 K/uL Final   RBC 03/29/2021 4.07 (L)  4.22 - 5.81 MIL/uL Final   Hemoglobin 03/29/2021 13.4  13.0 - 17.0 g/dL Final   HCT 03/29/2021 38.6 (L)  39.0 - 52.0 % Final   MCV 03/29/2021 94.8  80.0 - 100.0 fL Final   MCH 03/29/2021 32.9  26.0 - 34.0 pg Final   MCHC 03/29/2021 34.7  30.0 - 36.0 g/dL Final   RDW 03/29/2021 12.1  11.5 - 15.5 % Final   Platelets 03/29/2021 397  150 - 400 K/uL Final   REPEATED TO VERIFY   nRBC 03/29/2021 0.0  0.0 - 0.2 % Final   Neutrophils Relative % 03/29/2021 27  % Final   Neutro Abs 03/29/2021 1.2 (L)  1.7 - 7.7 K/uL Final   Lymphocytes Relative 03/29/2021 48  % Final   Lymphs Abs 03/29/2021 2.2  0.7 - 4.0 K/uL Final   Monocytes Relative 03/29/2021 14  % Final   Monocytes Absolute 03/29/2021 0.7  0.1 - 1.0 K/uL Final   Eosinophils Relative 03/29/2021 10  % Final   Eosinophils Absolute 03/29/2021 0.4  0.0 - 0.5 K/uL Final   Basophils Relative 03/29/2021 1  % Final   Basophils Absolute 03/29/2021 0.0  0.0 - 0.1 K/uL Final   Immature Granulocytes 03/29/2021 0  % Final   Abs Immature Granulocytes 03/29/2021 0.00  0.00 -  0.07 K/uL Final   Performed at [facility], [place] [address] [place], [place] [address]   Magnesium 03/29/2021 2.1  1.7 - 2.4 mg/dL Final   Performed at [place] [address] [place], [place] [address]   Alcohol, Ethyl (B) 03/29/2021 <10  <10 mg/dL Final   Comment: (NOTE) Lowest detectable limit for serum alcohol is 10 mg/dL.  For medical purposes only. Performed at [facility], [place] [address] [place], [place] [address]    Cholesterol 03/29/2021 158  0 - 200 mg/dL Final   Triglycerides 03/29/2021 73  <150 mg/dL Final   HDL 03/29/2021 70  >40 mg/dL Final   Total CHOL/HDL Ratio 03/29/2021 2.3  RATIO Final   VLDL 03/29/2021 15  0 - 40 mg/dL Final   LDL Cholesterol 03/29/2021 73  0 - 99  mg/dL Final   Comment:        Total Cholesterol/HDL:CHD Risk Coronary Heart Disease Risk Table                     Men   Women  1/2 Average Risk   3.4   3.3  Average Risk       5.0   4.4  2 X Average Risk   9.6   7.1  3 X Average Risk  23.4   11.0        Use the calculated Patient Ratio above and the CHD Risk Table to determine the patient's CHD Risk.        ATP III CLASSIFICATION (LDL):  <100     mg/dL   Optimal  100-129  mg/dL   Near or Above                    Optimal  130-159  mg/dL   Borderline  160-189  mg/dL   High  >190     mg/dL   Very High Performed at [name] [address] [place], [place] [address]    TSH 03/29/2021 2.169  0.350 - 4.500 uIU/mL Final   Comment: Performed by a 3rd Generation assay with a functional sensitivity of <=0.01 uIU/mL. Performed at [facility], [place] [address] [place], [place] [address]    Sodium 03/29/2021 140  135 - 145 mmol/L Final   Potassium 03/29/2021 4.2  3.5 - 5.1 mmol/L Final   Chloride 03/29/2021 105  98 - 111 mmol/L Final   CO2 03/29/2021 28  22 - 32 mmol/L Final   Glucose, Bld 03/29/2021 71  70 - 99 mg/dL Final   Glucose reference range applies only to samples taken after fasting for at least 8 hours.   BUN 03/29/2021 16  6 - 20 mg/dL Final   Creatinine, Ser 03/29/2021 0.90  0.61 - 1.24 mg/dL Final   Calcium 03/29/2021 8.9  8.9 - 10.3 mg/dL Final   Total Protein 03/29/2021 6.6  6.5 - 8.1 g/dL Final   Albumin 03/29/2021 3.6  3.5 - 5.0 g/dL Final   AST 03/29/2021 17  15 - 41 U/L Final   ALT 03/29/2021 11  0 - 44 U/L Final   Alkaline Phosphatase 03/29/2021 100  38 - 126 U/L Final   Total Bilirubin 03/29/2021 0.4  0.3 - 1.2 mg/dL Final   GFR, Estimated 03/29/2021 >60  >60 mL/min Final   Comment: (NOTE) Calculated using the CKD-EPI Creatinine Equation (2021)    Anion gap 03/29/2021 7  5 - 15 Final  Performed at [facility], [place] [address], [place], [place] [address]   POC Amphetamine UR 03/29/2021  None Detected  NONE DETECTED (Cut Off Level 1000 ng/mL) Final   POC Secobarbital (BAR) 03/29/2021 None Detected  NONE DETECTED (Cut Off Level 300 ng/mL) Final   POC Buprenorphine (BUP) 03/29/2021 None Detected  NONE DETECTED (Cut Off Level 10 ng/mL) Final   POC Oxazepam (BZO) 03/29/2021 None Detected  NONE DETECTED (Cut Off Level 300 ng/mL) Final   POC Cocaine UR 03/29/2021 Positive (A)  NONE DETECTED (Cut Off Level 300 ng/mL) Final   POC Methamphetamine UR 03/29/2021 None Detected  NONE DETECTED (Cut Off Level 1000 ng/mL) Final   POC Morphine 03/29/2021 None Detected  NONE DETECTED (Cut Off Level 300 ng/mL) Final   POC Oxycodone UR 03/29/2021 None Detected  NONE DETECTED (Cut Off Level 100 ng/mL) Final   POC Methadone UR 03/29/2021 None Detected  NONE DETECTED (Cut Off Level 300 ng/mL) Final   POC Marijuana UR 03/29/2021 Positive (A)  NONE DETECTED (Cut Off Level 50 ng/mL) Final   SARS Coronavirus 2 Ag 03/29/2021 Negative  Negative Preliminary   SARSCOV2ONAVIRUS 2 AG 03/29/2021 NEGATIVE  NEGATIVE Final   Comment: (NOTE) SARS-CoV-2 antigen NOT DETECTED.   Negative results are presumptive.  Negative results do not preclude SARS-CoV-2 infection and should not be used as the sole basis for treatment or other patient management decisions, including infection  control decisions, particularly in the presence of clinical signs and  symptoms consistent with COVID-19, or in those who have been in contact with the virus.  Negative results must be combined with clinical observations, patient history, and epidemiological information. The expected result is Negative.  Fact Sheet for Patients: HandmadeRecipes.com.cy  Fact Sheet for Healthcare Providers: FuneralLife.at  This test is not yet approved or cleared by the [place] FDA and  has been authorized for detection and/or diagnosis of SARS-CoV-2 by FDA under an Emergency Use Authorization (EUA).   This EUA will remain in effect (meaning this test can be used) for the duration of  the COV                          ID-19 declaration under Section 564(b)(1) of the Act, 21 U.S.C. section 360bbb-3(b)(1), unless the authorization is terminated or revoked sooner.    Admission on 03/09/2021, Discharged on 03/10/2021  Component Date Value Ref Range Status   Alcohol, Ethyl (B) 03/09/2021 <10  <10 mg/dL Final   Comment: (NOTE) Lowest detectable limit for serum alcohol is 10 mg/dL.  For medical purposes only. Performed at [facility], [place] [address] [place], [place] [address]    Opiates 03/09/2021 NONE DETECTED  NONE DETECTED Final   Cocaine 03/09/2021 POSITIVE (A)  NONE DETECTED Final   Benzodiazepines 03/09/2021 NONE DETECTED  NONE DETECTED Final   Amphetamines 03/09/2021 NONE DETECTED  NONE DETECTED Final   Tetrahydrocannabinol 03/09/2021 POSITIVE (A)  NONE DETECTED Final   Barbiturates 03/09/2021 NONE DETECTED  NONE DETECTED Final   Comment: (NOTE) DRUG SCREEN FOR MEDICAL PURPOSES ONLY.  IF CONFIRMATION IS NEEDED FOR ANY PURPOSE, NOTIFY LAB WITHIN 5 DAYS.  LOWEST DETECTABLE LIMITS FOR URINE DRUG SCREEN Drug Class                     Cutoff (ng/mL) Amphetamine and metabolites    1000 Barbiturate and metabolites    200 Benzodiazepine  485 Tricyclics and metabolites     300 Opiates and metabolites        300 Cocaine and metabolites        300 THC                            50 Performed at [facility], [place] [address], [place], [place] [address]    Salicylate Lvl 35/00/9381 <7.0 (L)  7.0 - 30.0 mg/dL Final   Performed at [place] [address], [place], [place] [address]   Acetaminophen (Tylenol), Serum 03/09/2021 <10 (L)  10 - 30 ug/mL Final   Comment: (NOTE) Therapeutic concentrations vary significantly. A range of 10-30 ug/mL  may be an effective concentration for many patients. However, some   are best treated at concentrations outside of this range. Acetaminophen concentrations >150 ug/mL at 4 hours after ingestion  and >50 ug/mL at 12 hours after ingestion are often associated with  toxic reactions.  Performed at [facility], [place] [address], [place], [place] [address]    WBC 03/09/2021 5.9  4.0 - 10.5 K/uL Final   RBC 03/09/2021 4.19 (L)  4.22 - 5.81 MIL/uL Final   Hemoglobin 03/09/2021 13.3  13.0 - 17.0 g/dL Final   HCT 03/09/2021 41.0  39.0 - 52.0 % Final   MCV 03/09/2021 97.9  80.0 - 100.0 fL Final   MCH 03/09/2021 31.7  26.0 - 34.0 pg Final   MCHC 03/09/2021 32.4  30.0 - 36.0 g/dL Final   RDW 03/09/2021 12.4  11.5 - 15.5 % Final   Platelets 03/09/2021 423 (H)  150 - 400 K/uL Final   nRBC 03/09/2021 0.0  0.0 - 0.2 % Final   Neutrophils Relative % 03/09/2021 55  % Final   Neutro Abs 03/09/2021 3.3  1.7 - 7.7 K/uL Final   Lymphocytes Relative 03/09/2021 28  % Final   Lymphs Abs 03/09/2021 1.6  0.7 - 4.0 K/uL Final   Monocytes Relative 03/09/2021 10  % Final   Monocytes Absolute 03/09/2021 0.6  0.1 - 1.0 K/uL Final   Eosinophils Relative 03/09/2021 6  % Final   Eosinophils Absolute 03/09/2021 0.3  0.0 - 0.5 K/uL Final   Basophils Relative 03/09/2021 1  % Final   Basophils Absolute 03/09/2021 0.0  0.0 - 0.1 K/uL Final   Immature Granulocytes 03/09/2021 0  % Final   Abs Immature Granulocytes 03/09/2021 0.02  0.00 - 0.07 K/uL Final   Performed at [facility], [place] [address], [place], [place] [address]   Sodium 03/09/2021 139  135 - 145 mmol/L Final   Potassium 03/09/2021 4.2  3.5 - 5.1 mmol/L Final   Chloride 03/09/2021 107  98 - 111 mmol/L Final   CO2 03/09/2021 24  22 - 32 mmol/L Final   Glucose, Bld 03/09/2021 96  70 - 99 mg/dL Final   Glucose reference range applies only to samples taken after fasting for at least 8 hours.   BUN 03/09/2021 23 (H)  6 - 20 mg/dL Final   Creatinine, Ser 03/09/2021 1.20  0.61 - 1.24 mg/dL Final    Calcium 03/09/2021 8.6 (L)  8.9 - 10.3 mg/dL Final   Total Protein 03/09/2021 7.2  6.5 - 8.1 g/dL Final   Albumin 03/09/2021 3.6  3.5 - 5.0 g/dL Final   AST 03/09/2021 21  15 - 41 U/L Final   ALT 03/09/2021 14  0 - 44 U/L  Final   Alkaline Phosphatase 03/09/2021 85  38 - 126 U/L Final   Total Bilirubin 03/09/2021 0.5  0.3 - 1.2 mg/dL Final   GFR, Estimated 03/09/2021 >60  >60 mL/min Final   Comment: (NOTE) Calculated using the CKD-EPI Creatinine Equation (2021)    Anion gap 03/09/2021 8  5 - 15 Final   Performed at [facility], [place] [address], [place], [place] [address]   SARS Coronavirus 2 by RT PCR 03/09/2021 NEGATIVE  NEGATIVE Final   Comment: (NOTE) SARS-CoV-2 target nucleic acids are NOT DETECTED.  The SARS-CoV-2 RNA is generally detectable in upper respiratory specimens during the acute phase of infection. The lowest concentration of SARS-CoV-2 viral copies this assay can detect is 138 copies/mL. A negative result does not preclude SARS-Cov-2 infection and should not be used as the sole basis for treatment or other patient management decisions. A negative result may occur with  improper specimen collection/handling, submission of specimen other than nasopharyngeal swab, presence of viral mutation(s) within the areas targeted by this assay, and inadequate number of viral copies(<138 copies/mL). A negative result must be combined with clinical observations, patient history, and epidemiological information. The expected result is Negative.  Fact Sheet for Patients:  EntrepreneurPulse.com.au  Fact Sheet for Healthcare Providers:  IncredibleEmployment.be  This test is no                          t yet approved or cleared by the [place] FDA and  has been authorized for detection and/or diagnosis of SARS-CoV-2 by FDA under an Emergency Use Authorization (EUA). This EUA will remain  in effect (meaning this test can  be used) for the duration of the COVID-19 declaration under Section 564(b)(1) of the Act, 21 U.S.C.section 360bbb-3(b)(1), unless the authorization is terminated  or revoked sooner.       Influenza A by PCR 03/09/2021 NEGATIVE  NEGATIVE Final   Influenza B by PCR 03/09/2021 NEGATIVE  NEGATIVE Final   Comment: (NOTE) The Xpert Xpress SARS-CoV-2/FLU/RSV plus assay is intended as an aid in the diagnosis of influenza from Nasopharyngeal swab specimens and should not be used as a sole basis for treatment. Nasal washings and aspirates are unacceptable for Xpert Xpress SARS-CoV-2/FLU/RSV testing.  Fact Sheet for Patients: EntrepreneurPulse.com.au  Fact Sheet for Healthcare Providers: IncredibleEmployment.be  This test is not yet approved or cleared by the [place] FDA and has been authorized for detection and/or diagnosis of SARS-CoV-2 by FDA under an Emergency Use Authorization (EUA). This EUA will remain in effect (meaning this test can be used) for the duration of the COVID-19 declaration under Section 564(b)(1) of the Act, 21 U.S.C. section 360bbb-3(b)(1), unless the authorization is terminated or revoked.  Performed at [facility], [place] [address] [place],  [address]   Office Visit on 02/10/2021  Component Date Value Ref Range Status   HIV 1 RNA Quant 02/10/2021 Not Detected  Copies/mL Final   HIV-1 RNA Quant, Log 02/10/2021 Not Detected  Log cps/mL Final   Comment: . Reference Range:                           Not Detected     copies/mL                           Not Detected Log copies/mL . Marland Kitchen The test was performed using  Real-Time Polymerase Chain Reaction. . . Reportable Range: 20 copies/mL to 10,000,000 copies/mL (1.30 Log copies/mL to 7.00 Log copies/mL). .    CD4 T Cell Abs 02/10/2021 661  400 - 1,790 /uL Final   CD4 % Helper T Cell 02/10/2021 40  33 - 65 % Final   Performed at [facility], [place] [address], [place], [place] [address]   RPR Ser Ql 02/10/2021 NON-REACTIVE  NON-REACTIVE Final  Admission on 01/25/2021, Discharged on 01/25/2021  Component Date Value Ref Range Status   Sodium 01/25/2021 138  135 - 145 mmol/L Final   Potassium 01/25/2021 4.0  3.5 - 5.1 mmol/L Final   Chloride 01/25/2021 106  98 - 111 mmol/L Final   CO2 01/25/2021 22  22 - 32 mmol/L Final   Glucose, Bld 01/25/2021 93  70 - 99 mg/dL Final   Glucose reference range applies only to samples taken after fasting for at least 8 hours.   BUN 01/25/2021 15  6 - 20 mg/dL Final   Creatinine, Ser 01/25/2021 0.92  0.61 - 1.24 mg/dL Final   Calcium 01/25/2021 8.6 (L)  8.9 - 10.3 mg/dL Final   GFR, Estimated 01/25/2021 >60  >60 mL/min Final   Comment: (NOTE) Calculated using the CKD-EPI Creatinine Equation (2021)    Anion gap 01/25/2021 10  5 - 15 Final   Performed at [facility], [place] [address] [place], [place] [address]   WBC 01/25/2021 5.4  4.0 - 10.5 K/uL Final   RBC 01/25/2021 4.26  4.22 - 5.81 MIL/uL Final   Hemoglobin 01/25/2021 13.8  13.0 - 17.0 g/dL Final   HCT 01/25/2021 41.4  39.0 - 52.0 % Final   MCV 01/25/2021 97.2  80.0 - 100.0 fL Final   MCH 01/25/2021 32.4  26.0 - 34.0 pg Final   MCHC 01/25/2021 33.3  30.0 - 36.0 g/dL Final   RDW 01/25/2021 12.8  11.5 - 15.5 % Final   Platelets 01/25/2021 373  150 - 400 K/uL Final   nRBC 01/25/2021 0.0  0.0 - 0.2 % Final   Neutrophils Relative % 01/25/2021 30  % Final   Neutro Abs 01/25/2021 1.6 (L)  1.7 - 7.7 K/uL Final   Lymphocytes Relative 01/25/2021 54  % Final   Lymphs Abs 01/25/2021 2.9  0.7 - 4.0 K/uL Final   Monocytes Relative 01/25/2021 11  % Final   Monocytes Absolute 01/25/2021 0.6  0.1 - 1.0 K/uL Final   Eosinophils Relative 01/25/2021 5  % Final   Eosinophils Absolute 01/25/2021 0.3  0.0 - 0.5 K/uL Final   Basophils Relative 01/25/2021 0  % Final   Basophils Absolute 01/25/2021 0.0  0.0 - 0.1 K/uL Final    Immature Granulocytes 01/25/2021 0  % Final   Abs Immature Granulocytes 01/25/2021 0.01  0.00 - 0.07 K/uL Final   Performed at [facility], [place] [address] [place], [place] [address]   B Natriuretic Peptide 01/25/2021 31.2  0.0 - 100.0 pg/mL Final   Performed at [place] [address] [place], [place] [address]  Admission on 12/23/2020, Discharged on 12/23/2020  Component Date Value Ref Range Status   Hgb A1c MFr Bld 12/23/2020 5.2  4.8 - 5.6 % Final   Comment: (NOTE) Pre diabetes:          5.7%-6.4%  Diabetes:              >6.4%  Glycemic control for   <7.0% adults with diabetes  Mean Plasma Glucose 12/23/2020 102.54  mg/dL Final   Performed at [place] [address] [place], [place] [address]  Admission on 12/22/2020, Discharged on 12/22/2020  Component Date Value Ref Range Status   Sodium 12/22/2020 141  135 - 145 mmol/L Final   Potassium 12/22/2020 3.9  3.5 - 5.1 mmol/L Final   Chloride 12/22/2020 106  98 - 111 mmol/L Final   CO2 12/22/2020 24  22 - 32 mmol/L Final   Glucose, Bld 12/22/2020 104 (H)  70 - 99 mg/dL Final   Glucose reference range applies only to samples taken after fasting for at least 8 hours.   BUN 12/22/2020 16  6 - 20 mg/dL Final   Creatinine, Ser 12/22/2020 1.02  0.61 - 1.24 mg/dL Final   Calcium 12/22/2020 9.5  8.9 - 10.3 mg/dL Final   Total Protein 12/22/2020 7.8  6.5 - 8.1 g/dL Final   Albumin 12/22/2020 4.4  3.5 - 5.0 g/dL Final   AST 12/22/2020 18  15 - 41 U/L Final   ALT 12/22/2020 13  0 - 44 U/L Final   Alkaline Phosphatase 12/22/2020 68  38 - 126 U/L Final   Total Bilirubin 12/22/2020 0.9  0.3 - 1.2 mg/dL Final   GFR, Estimated 12/22/2020 >60  >60 mL/min Final   Comment: (NOTE) Calculated using the CKD-EPI Creatinine Equation (2021)    Anion gap 12/22/2020 11  5 - 15 Final   Performed at [facility], [place] [address], [place], [place] [address]   Alcohol, Ethyl (B) 12/22/2020 <10  <10 mg/dL  Final   Performed at [place] [address] [place], [place] [address]   Salicylate Lvl 26/37/8588 <7.0 (L)  7.0 - 30.0 mg/dL Final   Performed at [place] [address], [place], [place] [address]   Acetaminophen (Tylenol), Serum 12/22/2020 <10 (L)  10 - 30 ug/mL Final   Performed at [facility], [place] [address], [place], [place] [address]   WBC 12/22/2020 5.1  4.0 - 10.5 K/uL Final   RBC 12/22/2020 4.60  4.22 - 5.81 MIL/uL Final   Hemoglobin 12/22/2020 14.9  13.0 - 17.0 g/dL Final   HCT 12/22/2020 46.0  39.0 - 52.0 % Final   MCV 12/22/2020 100.0  80.0 - 100.0 fL Final   MCH 12/22/2020 32.4  26.0 - 34.0 pg Final   MCHC 12/22/2020 32.4  30.0 - 36.0 g/dL Final   RDW 12/22/2020 13.0  11.5 - 15.5 % Final   Platelets 12/22/2020 383  150 - 400 K/uL Final   nRBC 12/22/2020 0.0  0.0 - 0.2 % Final   Performed at [facility], [place] [address], [place], [place] [address]   Opiates 12/22/2020 NONE DETECTED  NONE DETECTED Final   Cocaine 12/22/2020 POSITIVE (A)  NONE DETECTED Final   Benzodiazepines 12/22/2020 NONE DETECTED  NONE DETECTED Final   Amphetamines 12/22/2020 NONE DETECTED  NONE DETECTED Final   Tetrahydrocannabinol 12/22/2020 POSITIVE (A)  NONE DETECTED Final   Barbiturates 12/22/2020 NONE DETECTED  NONE DETECTED Final   Comment: (NOTE) DRUG SCREEN FOR MEDICAL PURPOSES ONLY.  IF CONFIRMATION IS NEEDED FOR ANY PURPOSE, NOTIFY LAB WITHIN 5 DAYS.  LOWEST DETECTABLE LIMITS FOR URINE DRUG SCREEN Drug Class                     Cutoff (ng/mL) Amphetamine and metabolites    1000 Barbiturate and metabolites    200  Benzodiazepine                 169 Tricyclics and metabolites     300 Opiates and metabolites        300 Cocaine and metabolites        300 THC                            50 Performed at [facility], [place] [address], [place], [place] [address]    SARS Coronavirus 2 by  RT PCR 12/22/2020 NEGATIVE  NEGATIVE Final   Comment: (NOTE) SARS-CoV-2 target nucleic acids are NOT DETECTED.  The SARS-CoV-2 RNA is generally detectable in upper respiratory specimens during the acute phase of infection. The lowest concentration of SARS-CoV-2 viral copies this assay can detect is 138 copies/mL. A negative result does not preclude SARS-Cov-2 infection and should not be used as the sole basis for treatment or other patient management decisions. A negative result may occur with  improper specimen collection/handling, submission of specimen other than nasopharyngeal swab, presence of viral mutation(s) within the areas targeted by this assay, and inadequate number of viral copies(<138 copies/mL). A negative result must be combined with clinical observations, patient history, and epidemiological information. The expected result is Negative.  Fact Sheet for Patients:  EntrepreneurPulse.com.au  Fact Sheet for Healthcare Providers:  IncredibleEmployment.be  This test is no                          t yet approved or cleared by the [place] FDA and  has been authorized for detection and/or diagnosis of SARS-CoV-2 by FDA under an Emergency Use Authorization (EUA). This EUA will remain  in effect (meaning this test can be used) for the duration of the COVID-19 declaration under Section 564(b)(1) of the Act, 21 U.S.C.section 360bbb-3(b)(1), unless the authorization is terminated  or revoked sooner.       Influenza A by PCR 12/22/2020 NEGATIVE  NEGATIVE Final   Influenza B by PCR 12/22/2020 NEGATIVE  NEGATIVE Final   Comment: (NOTE) The Xpert Xpress SARS-CoV-2/FLU/RSV plus assay is intended as an aid in the diagnosis of influenza from Nasopharyngeal swab specimens and should not be used as a sole basis for treatment. Nasal washings and aspirates are unacceptable for Xpert Xpress SARS-CoV-2/FLU/RSV testing.  Fact Sheet for  Patients: EntrepreneurPulse.com.au  Fact Sheet for Healthcare Providers: IncredibleEmployment.be  This test is not yet approved or cleared by the [place] FDA and has been authorized for detection and/or diagnosis of SARS-CoV-2 by FDA under an Emergency Use Authorization (EUA). This EUA will remain in effect (meaning this test can be used) for the duration of the COVID-19 declaration under Section 564(b)(1) of the Act, 21 U.S.C. section 360bbb-3(b)(1), unless the authorization is terminated or revoked.  Performed at [facility], [place] [address], [place], [place] [address]   Admission on 11/19/2020, Discharged on 11/24/2020  Component Date Value Ref Range Status   SARS Coronavirus 2 by RT PCR 11/19/2020 NEGATIVE  NEGATIVE Final   Comment: (NOTE) SARS-CoV-2 target nucleic acids are NOT DETECTED.  The SARS-CoV-2 RNA is generally detectable in upper respiratory specimens during the acute phase of infection. The lowest concentration of SARS-CoV-2 viral copies this assay can detect is 138 copies/mL. A negative result does not preclude SARS-Cov-2 infection and should not be used as the sole basis for treatment or other patient management decisions. A  negative result may occur with  improper specimen collection/handling, submission of specimen other than nasopharyngeal swab, presence of viral mutation(s) within the areas targeted by this assay, and inadequate number of viral copies(<138 copies/mL). A negative result must be combined with clinical observations, patient history, and epidemiological information. The expected result is Negative.  Fact Sheet for Patients:  EntrepreneurPulse.com.au  Fact Sheet for Healthcare Providers:  IncredibleEmployment.be  This test is no                          t yet approved or cleared by the [place] FDA and  has been authorized for detection  and/or diagnosis of SARS-CoV-2 by FDA under an Emergency Use Authorization (EUA). This EUA will remain  in effect (meaning this test can be used) for the duration of the COVID-19 declaration under Section 564(b)(1) of the Act, 21 U.S.C.section 360bbb-3(b)(1), unless the authorization is terminated  or revoked sooner.       Influenza A by PCR 11/19/2020 NEGATIVE  NEGATIVE Final   Influenza B by PCR 11/19/2020 NEGATIVE  NEGATIVE Final   Comment: (NOTE) The Xpert Xpress SARS-CoV-2/FLU/RSV plus assay is intended as an aid in the diagnosis of influenza from Nasopharyngeal swab specimens and should not be used as a sole basis for treatment. Nasal washings and aspirates are unacceptable for Xpert Xpress SARS-CoV-2/FLU/RSV testing.  Fact Sheet for Patients: EntrepreneurPulse.com.au  Fact Sheet for Healthcare Providers: IncredibleEmployment.be  This test is not yet approved or cleared by the [place] FDA and has been authorized for detection and/or diagnosis of SARS-CoV-2 by FDA under an Emergency Use Authorization (EUA). This EUA will remain in effect (meaning this test can be used) for the duration of the COVID-19 declaration under Section 564(b)(1) of the Act, 21 U.S.C. section 360bbb-3(b)(1), unless the authorization is terminated or revoked.  Performed at [facility], [place] [address], [place], [place] [address]    WBC 11/19/2020 5.0  4.0 - 10.5 K/uL Final   RBC 11/19/2020 4.15 (L)  4.22 - 5.81 MIL/uL Final   Hemoglobin 11/19/2020 13.4  13.0 - 17.0 g/dL Final   HCT 11/19/2020 39.9  39.0 - 52.0 % Final   MCV 11/19/2020 96.1  80.0 - 100.0 fL Final   MCH 11/19/2020 32.3  26.0 - 34.0 pg Final   MCHC 11/19/2020 33.6  30.0 - 36.0 g/dL Final   RDW 11/19/2020 12.6  11.5 - 15.5 % Final   Platelets 11/19/2020 360  150 - 400 K/uL Final   nRBC 11/19/2020 0.0  0.0 - 0.2 % Final   Neutrophils Relative % 11/19/2020 42  % Final   Neutro Abs  11/19/2020 2.1  1.7 - 7.7 K/uL Final   Lymphocytes Relative 11/19/2020 39  % Final   Lymphs Abs 11/19/2020 1.9  0.7 - 4.0 K/uL Final   Monocytes Relative 11/19/2020 13  % Final   Monocytes Absolute 11/19/2020 0.7  0.1 - 1.0 K/uL Final   Eosinophils Relative 11/19/2020 5  % Final   Eosinophils Absolute 11/19/2020 0.2  0.0 - 0.5 K/uL Final   Basophils Relative 11/19/2020 1  % Final   Basophils Absolute 11/19/2020 0.0  0.0 - 0.1 K/uL Final   Immature Granulocytes 11/19/2020 0  % Final   Abs Immature Granulocytes 11/19/2020 0.02  0.00 - 0.07 K/uL Final   Performed at [facility], [place] [address] [place], [place] [address]   Sodium 11/19/2020 137  135 - 145 mmol/L Final  Potassium 11/19/2020 4.1  3.5 - 5.1 mmol/L Final   Chloride 11/19/2020 103  98 - 111 mmol/L Final   CO2 11/19/2020 24  22 - 32 mmol/L Final   Glucose, Bld 11/19/2020 81  70 - 99 mg/dL Final   Glucose reference range applies only to samples taken after fasting for at least 8 hours.   BUN 11/19/2020 9  6 - 20 mg/dL Final   Creatinine, Ser 11/19/2020 0.94  0.61 - 1.24 mg/dL Final   Calcium 11/19/2020 9.3  8.9 - 10.3 mg/dL Final   Total Protein 11/19/2020 7.0  6.5 - 8.1 g/dL Final   Albumin 11/19/2020 4.0  3.5 - 5.0 g/dL Final   AST 11/19/2020 24  15 - 41 U/L Final   ALT 11/19/2020 17  0 - 44 U/L Final   Alkaline Phosphatase 11/19/2020 74  38 - 126 U/L Final   Total Bilirubin 11/19/2020 0.6  0.3 - 1.2 mg/dL Final   GFR, Estimated 11/19/2020 >60  >60 mL/min Final   Comment: (NOTE) Calculated using the CKD-EPI Creatinine Equation (2021)    Anion gap 11/19/2020 10  5 - 15 Final   Performed at [place] [address], [place], [place] [address]   Alcohol, Ethyl (B) 11/19/2020 <10  <10 mg/dL Final   Comment: (NOTE) Lowest detectable limit for serum alcohol is 10 mg/dL.  For medical purposes only. Performed at [facility], [place] [address] [place], [place] [address]    POC Amphetamine UR 11/20/2020  None Detected  NONE DETECTED (Cut Off Level 1000 ng/mL) Final   POC Secobarbital (BAR) 11/20/2020 None Detected  NONE DETECTED (Cut Off Level 300 ng/mL) Final   POC Buprenorphine (BUP) 11/20/2020 None Detected  NONE DETECTED (Cut Off Level 10 ng/mL) Final   POC Oxazepam (BZO) 11/20/2020 None Detected  NONE DETECTED (Cut Off Level 300 ng/mL) Final   POC Cocaine UR 11/20/2020 Positive (A)  NONE DETECTED (Cut Off Level 300 ng/mL) Final   POC Methamphetamine UR 11/20/2020 None Detected  NONE DETECTED (Cut Off Level 1000 ng/mL) Final   POC Morphine 11/20/2020 None Detected  NONE DETECTED (Cut Off Level 300 ng/mL) Final   POC Oxycodone UR 11/20/2020 None Detected  NONE DETECTED (Cut Off Level 100 ng/mL) Final   POC Methadone UR 11/20/2020 None Detected  NONE DETECTED (Cut Off Level 300 ng/mL) Final   POC Marijuana UR 11/20/2020 Positive (A)  NONE DETECTED (Cut Off Level 50 ng/mL) Final   SARS Coronavirus 2 Ag 11/19/2020 Negative  Negative Final   SARSCOV2ONAVIRUS 2 AG 11/19/2020 NEGATIVE  NEGATIVE Final   Comment: (NOTE) SARS-CoV-2 antigen NOT DETECTED.   Negative results are presumptive.  Negative results do not preclude SARS-CoV-2 infection and should not be used as the sole basis for treatment or other patient management decisions, including infection  control decisions, particularly in the presence of clinical signs and  symptoms consistent with COVID-19, or in those who have been in contact with the virus.  Negative results must be combined with clinical observations, patient history, and epidemiological information. The expected result is Negative.  Fact Sheet for Patients: HandmadeRecipes.com.cy  Fact Sheet for Healthcare Providers: FuneralLife.at  This test is not yet approved or cleared by the [place] FDA and  has been authorized for detection and/or diagnosis of SARS-CoV-2 by FDA under an Emergency Use Authorization (EUA).  This  EUA will remain in effect (meaning this test can be used) for the duration of  the COV  ID-19 declaration under Section 564(b)(1) of the Act, 21 U.S.C. section 360bbb-3(b)(1), unless the authorization is terminated or revoked sooner.    Office Visit on 10/27/2020  Component Date Value Ref Range Status   Glucose, Bld 10/27/2020 87  65 - 99 mg/dL Final   Comment: .            Fasting reference interval .    BUN 10/27/2020 15  7 - 25 mg/dL Final   Creat 10/27/2020 0.98  0.60 - 1.29 mg/dL Final   eGFR 10/27/2020 95  > OR = 60 mL/min/1.63m Final   Comment: The eGFR is based on the CKD-EPI 2021 equation. To calculate  the new eGFR from a previous Creatinine or Cystatin C result, go to https://www.kidney.org/professionals/ kdoqi/gfr%5Fcalculator    BUN/Creatinine Ratio 024/40/1027NOT APPLICABLE  6 - 22 (calc) Final   Sodium 10/27/2020 142  135 - 146 mmol/L Final   Potassium 10/27/2020 4.3  3.5 - 5.3 mmol/L Final   Chloride 10/27/2020 104  98 - 110 mmol/L Final   CO2 10/27/2020 29  20 - 32 mmol/L Final   Calcium 10/27/2020 9.6  8.6 - 10.3 mg/dL Final   Total Protein 10/27/2020 7.3  6.1 - 8.1 g/dL Final   Albumin 10/27/2020 4.3  3.6 - 5.1 g/dL Final   Globulin 10/27/2020 3.0  1.9 - 3.7 g/dL (calc) Final   AG Ratio 10/27/2020 1.4  1.0 - 2.5 (calc) Final   Total Bilirubin 10/27/2020 0.6  0.2 - 1.2 mg/dL Final   Alkaline phosphatase (APISO) 10/27/2020 76  36 - 130 U/L Final   AST 10/27/2020 15  10 - 40 U/L Final   ALT 10/27/2020 11  9 - 46 U/L Final   HIV 1 RNA Quant 10/27/2020 Not Detected  Copies/mL Final   HIV-1 RNA Quant, Log 10/27/2020 Not Detected  Log cps/mL Final   Comment: . Reference Range:                           Not Detected     copies/mL                           Not Detected Log copies/mL . .Marland KitchenThe test was performed using Real-Time Polymerase Chain Reaction. . . Reportable Range: 20 copies/mL to 10,000,000 copies/mL (1.30 Log copies/mL to  7.00 Log copies/mL). .    CD4 T Cell Abs 10/27/2020 462  400 - 1,790 /uL Final   CD4 % Helper T Cell 10/27/2020 44  33 - 65 % Final   Performed at [facility] [address], [place] [address]  RPR Ser Ql 10/27/2020 NON-REACTIVE  NON-REACTIVE Final    Blood Alcohol level:  Lab Results  Component Value Date   [facility]   ETH <10 144/05/4740   Metabolic Disorder Labs: Lab Results  Component Value Date   HGBA1C 5.2 12/23/2020   MPG 102.54 12/23/2020   MPG 102.54 09/18/2020   Lab Results  Component Value Date   PROLACTIN 6.7 09/29/2012   Lab Results  Component Value Date   CHOL 158 03/29/2021   TRIG 73 03/29/2021   HDL 70 03/29/2021   CHOLHDL 2.3 03/29/2021   VLDL 15 03/29/2021   LDLCALC 73 03/29/2021   LDLCALC 94 09/18/2020    Therapeutic Lab Levels: No results found for: LITHIUM Lab Results  Component Value Date   VALPROATE 59 04/06/2015  VALPROATE 95 11/18/2014   No components found for:  CBMZ  Physical Findings   AIMS    Flowsheet Row Admission (Discharged) from 09/17/2020 in [place] [facility] Admission (Discharged) from 03/23/2019 in [place] [facility] Admission (Discharged) from 03/31/2015 in [place] [facility] Admission (Discharged) from 11/14/2014 in [place] Total Score 0 0 0 0      AUDIT    Flowsheet Row Admission (Discharged) from 09/17/2020 in [place] 300B Counselor from 05/22/2020 in [facility] Admission (Discharged) from 03/23/2019 in [place] [facility] Admission (Discharged) from 03/31/2015 in [place] 500B Admission (Discharged) from 11/14/2014 in [facility]  Alcohol Use Disorder Identification Test Final Score (AUDIT) 31 32 '28 12 12      ' GAD-7    Flowsheet  Row Office Visit from 11/24/2020 in [facility] Office Visit from 10/03/2020 in [facility] Office Visit from 08/01/2020 in [facility] Office Visit from 05/22/2020 in [facility]  Total GAD-7 Score '16 18 9 19      ' PHQ2-9    [place] Office Visit from 02/10/2021 in [facility] Office Visit from 11/24/2020 in [facility] Office Visit from 10/27/2020 in [facility] Office Visit from 10/03/2020 in [facility] Office Visit from 08/07/2020 in [facility] for Infectious Disease  PHQ-2 Total Score 0 5 0 4 0  PHQ-9 Total Score -- 18 -- 18 --      [place] ED from 03/29/2021 in [facility] ED from 03/09/2021 in [facility] DEPT ED from 12/31/2020 in [facility] DEPT  C-SSRS RISK CATEGORY High Risk High Risk High Risk        Musculoskeletal  Strength & Muscle Tone: within normal limits Gait & Station: normal Patient leans: N/A  Psychiatric Specialty Exam  Presentation  General Appearance: Disheveled  Eye Contact:Fair  Speech:Clear and Coherent  Speech Volume:Normal  Handedness:Right   Mood and Affect  Mood:Dysphoric  Affect:Congruent   Thought Process  Thought Processes:Coherent  Descriptions of Associations:Intact  Orientation:Full (Time, Place and Person)  Thought Content:Logical  Diagnosis of Schizophrenia or Schizoaffective disorder in past: Yes  Duration of Psychotic Symptoms: Greater than six months   Hallucinations:Hallucinations: Visual; Auditory Description of Auditory Hallucinations: voices telling him to kill everyone Description of Visual Hallucinations: sees purple elephantes and blue baloons  Ideas of  Reference:None  Suicidal Thoughts:Suicidal Thoughts: Yes, Active SI Active Intent and/or Plan: With Intent; With Plan; With Means to Carry Out SI Passive Intent and/or Plan: With Intent; With Plan  Homicidal Thoughts:Homicidal Thoughts: Yes, Passive HI Passive Intent and/or Plan: Without Intent; Without Means to Carry Out; Without Plan   Sensorium  Memory:Immediate Good; Recent Good; Remote Good  Judgment:Fair  Insight:Fair   Executive Functions  Concentration:Good  Attention Span:Good  Recall:Good  Fund of Knowledge:Good  Language:Good   Psychomotor Activity  Psychomotor Activity:Psychomotor Activity: Normal   Assets  Assets:Communication Skills; Desire for Improvement; Financial Resources/Insurance; Leisure Time; Physical Health; Resilience   Sleep  Sleep:Sleep: Fair Number of Hours of Sleep: -1   Nutritional Assessment (For OBS and FBC admissions only) Has the patient had a weight loss or gain of 10 pounds or  more in the last 3 months?: Yes Has the patient had a decrease in food intake/or appetite?: Yes Does the patient have dental problems?: No Does the patient have eating habits or behaviors that may be indicators of an eating disorder including binging or inducing vomiting?: No Has the patient recently lost weight without trying?: 1 Has the patient been eating poorly because of a decreased appetite?: 1 Malnutrition Screening Tool Score: 2    Physical Exam  Physical Exam Vitals and nursing note reviewed.  Constitutional:      General: He is not in acute distress.    Appearance: Normal appearance. He is well-developed.  HENT:     Head: Normocephalic.  Eyes:     General:        Right eye: No discharge.        Left eye: No discharge.     Conjunctiva/sclera: Conjunctivae normal.  Cardiovascular:     Rate and Rhythm: Normal rate.  Pulmonary:     Effort: Pulmonary effort is normal. No respiratory distress.  Musculoskeletal:        General: Normal  range of motion.     Cervical back: Normal range of motion.  Skin:    Coloration: Skin is not jaundiced or pale.  Neurological:     Mental Status: He is alert and oriented to person, place, and time.  Psychiatric:        Attention and Perception: Attention normal. He perceives auditory and visual hallucinations.        Mood and Affect: Mood is anxious and depressed.        Speech: Speech normal.        Behavior: Behavior normal. Behavior is cooperative.        Thought Content: Thought content includes homicidal and suicidal ideation. Thought content includes suicidal plan. Thought content does not include homicidal plan.        Cognition and Memory: Cognition normal.        Judgment: Judgment normal.   Review of Systems  Constitutional: Negative.   HENT: Negative.    Eyes: Negative.   Respiratory: Negative.    Cardiovascular: Negative.   Genitourinary: Negative.   Musculoskeletal: Negative.   Skin: Negative.   Psychiatric/Behavioral:  Positive for depression, hallucinations and suicidal ideas. The patient is nervous/anxious.   Blood pressure 109/76, pulse 81, temperature (!) 97.5 F (36.4 C), temperature source Oral, resp. rate 16, SpO2 99 %. There is no height or weight on file to calculate BMI.  Treatment Plan Summary: Daily contact with patient to assess and evaluate symptoms and progress in treatment and Medication management  Disposition: patient continues to meet inpatient psychiatric criteria. Patient was declined at [facility]. Sw notified. Per chart review. Patient has not been faxed out as of yet.    Revonda Humphrey, NP 03/30/2021 6:36 PM

## 2021-03-30 NOTE — Progress Notes (Signed)
Medication compliant.  Patient endorsed SI and HI without specific plan. Endorsed hearing voices to tell him to kill the world.  Endorsed seeing pink elephants, green Martians and spaceships.  Irritable but cooperative.  Continue to monitor for safety.

## 2021-03-30 NOTE — ED Notes (Signed)
Oriented x3 , sleeping at intervals , thought process wnl and without paranoid or delusional content . gives brief responses to questions asked , not interacting with peers . Pt denies SI / HI or AVH , no behavioral issues at this point , will continue to monitor for safety ,

## 2021-03-30 NOTE — ED Notes (Signed)
Patient sleeping.  Respirations even and unlabored. Continue to monitor for safety.

## 2021-03-30 NOTE — ED Notes (Signed)
A 

## 2021-03-30 NOTE — Progress Notes (Signed)
Patient sleeping.  Respirations even and unlabored.  Continue to monitor for safety.

## 2021-03-30 NOTE — BH Assessment (Signed)
° ° ° ° °  Destination Service Provider Request Status Selected Services Address Phone Fax Patient Preferred  John [name] N/A [place], [place] [address] [phone] --  [place] N/A [address], [place] [address] [phone] --  [facility]  Pending - Request Sent N/A [address] [place] [address] [phone] --  [place], [place] [address] [phone] --  [place] [address] [place] [address] [phone] --  [place] [address] [place] [address] [phone] --  [facility]  Pending - Request Sent N/A [address], [place] [address] [phone] --  [place] Sent N/A [address] [place] [address] [phone] --  [facility]  Pending - Request Sent N/A [address] [place] [address] [phone] --  [facility]  Pending - Request Sent N/A [address], [place] [address] [phone] --  [facility]  Pending - Request Sent N/A [address] [place] [address] [phone] --  [place] N/A [address] [place] [address] [phone] --  [facility]  Pending - Request Sent N/A [address] [place] [address] [phone] --  [facility]  Pending - Request Sent N/A [address], [place] [address] [phone] --  [place] N/A [address] [place] [address] [phone] --  [address] [place] [address] [phone] --  [place] [phone] [address] [place] [address] [place] --  [facility]  Pending - Request Sent N/A [name] [place], [place] [address] [phone] --  [facility]  Pending - Request Sent N/A [address] [place] [address] [phone] --  [facility]  Pending - Request Sent N/A [address] [place] [address] [phone] --  [place] [place], [place] [address] [phone] --  [facility]  Pending - Request Sent N/A [address] [place] [address] [phone] --  [place] N/A [address], [place] [address] [phone] --  [place] N/A [place], [place] [address] [phone]

## 2021-03-30 NOTE — ED Notes (Signed)
Patient in his bed sleeping. Patient respirations are even and unlabored. Will continue to monitor for safety.  

## 2021-03-30 NOTE — ED Notes (Addendum)
Appears to be resting quietly at this time , eyes closed , respirations even abd unlabored , no distress noted . Will continue to monitor for safety

## 2021-03-30 NOTE — ED Notes (Signed)
Patient in his bed asleep. He was compliant with medication administration. Patient had night snack before returning to bed. No acute distress noted . Will continue to monitor for safety.

## 2021-03-31 MED ORDER — BUSPIRONE HCL 7.5 MG PO TABS: 7.5000 mg | ORAL_TABLET | Freq: Two times a day (BID) | ORAL | 0 refills | Status: DC

## 2021-03-31 MED ORDER — QUETIAPINE FUMARATE 100 MG PO TABS: 100.0000 mg | ORAL_TABLET | Freq: Every day | ORAL | 0 refills | Status: DC

## 2021-03-31 MED ORDER — MIRTAZAPINE 15 MG PO TABS: 15.0000 mg | ORAL_TABLET | Freq: Every day | ORAL | 0 refills | Status: DC

## 2021-03-31 NOTE — Discharge Instructions (Addendum)
Patient is instructed prior to discharge to: Take all medications as prescribed by his/her mental healthcare provider. Report any adverse effects and or reactions from the medicines to his/her outpatient provider promptly. Patient has been instructed & cautioned: To not engage in alcohol and or illegal drug use while on prescription medicines. In the event of worsening symptoms, patient is instructed to call the crisis hotline, 911 and or go to the nearest ED for appropriate evaluation and treatment of symptoms. To follow-up with his/her primary care provider for your other medical issues, concerns and or health care needs.  The suicide prevention education provided includes the following: Suicide risk factors Suicide prevention and interventions National Suicide Hotline telephone number [facility] assessment telephone number [facility] Emergency Assistance [place] and/or Residential Mobile Crisis Unit telephone number   Request made of family/significant other to: Remove weapons (e.g., guns, rifles, knives), all items previously/currently identified as safety concern.   Remove drugs/medications (over the counter, prescriptions, illicit drugs), all items previously/currently identified as a safety concern.

## 2021-03-31 NOTE — ED Notes (Signed)
Appears to be resting quietly . Eyes closed repirations unlabored , no distress noted . Will continue to monitor for safety

## 2021-03-31 NOTE — ED Notes (Signed)
Patient discharged with an After Visit Summary and follow up instructions. Patient verbalized understanding of medication education and where to pick up prescriptions. At the time of discharge patient denies SI, HI and AVH. Patient received all belongings from [facility] locker, and a bus pass.

## 2021-03-31 NOTE — ED Notes (Signed)
Patient continues to be comfortable aeb lying in bed and providing self-care such as eating and communicating needs.  Patient has requested to be released aeb patient verbal request.  Patient sitting in area eating and remaining calm. MHT continues to be monitored on unit.

## 2021-03-31 NOTE — Progress Notes (Signed)
Patient is alert and oriented X 3, denies SI, HI and AVH. Patient states he is ready for discharge this morning.

## 2021-03-31 NOTE — ED Provider Notes (Signed)
FBC/OBS ASAP Discharge Summary  Date and Time: 03/31/2021 10:02 AM  Name: John [name]  MRN:  [id number]   Discharge Diagnoses:  Final diagnoses:  Bipolar disorder, current episode depressed, severe, with psychotic features ([place])  Depression, major, recurrent, severe with psychosis ([place])  Generalized anxiety disorder  Asymptomatic HIV infection, with no history of HIV-related illness ([place])  Insomnia due to other mental disorder    Subjective:   John [name], 51 y.o., male patient presented to [facility] on 03/31/2021 with SI/I and AVH.  He was admitted to the continuous assessment unit for overnight observation.  He was assessed and recommended for inpatient psychiatric admission.  [facility] declined patient he has been faxed out. He has been declined by multiple facilities.  Patient was seen face to face by this provider, consulted with Dr. Serafina Mitchell; and  hart reviewed on 03/30/21.  Patient has a history of schizophrenia, homelessness, bipolar disorder, alcohol and cocaine abuse, SI and HIV.   On assessment patient informed this writer that he would like to be discharged. States, "I feel so much better and I am ready to go".  He is alert/oriented x4.  He makes good eye contact.  His speech is at a moderate pace, volume and tone.  Reports, "really feel depressed today".  He endorses anxiety, "because I am ready to go".  He is adamantly denying SI/HI/AVH.  He easily contracts for safety.  Reports upon discharge he is going to stay with a friend.  He is requesting a bus pass.  He does not appear to be responding to internal/external stimuli.  He is not psychotic, paranoid, or delusional.  He does not meet the criteria for involuntary commitment.  Patient will be discharged.  There appears to be an aspect of malingering for secondary gain for shelter.  However, patient adamantly states he has housing.   Stay Summary: Patient has been calm and cooperative while on the unit.  He has been observed  interacting with staff and other patients appropriately.  He has been monitored on the unit and has shown no unsafe behaviors.  He is requesting prescription for his medications upon discharge and a bus pass.  Reports he has a follow-up appointment with his outpatient provider Trinna Post PA with behavioral health outpatient services on the second floor later this month.  He was provide with a two week printed prescription for Seroquel 100 mg QHS, Remeron 15 mg QHS, and Buspirone 7.5mg  QHS.  Discussed the importance of keeping his appointment and being managed by his outpatient provider.  Patient agreed  Upon completion of this admission John [name] was both mentally and medically stable for discharge denying suicidal/homicidal ideation, auditory/visual/tactile hallucinations, delusional thoughts and paranoia.       Total Time spent with patient: 30 minutes  Past Psychiatric History: See H&P Past Medical History:  Past Medical History:  Diagnosis Date   Alcohol abuse    Alcoholism ([place])    Anxiety    Asthma    Asthma    Bipolar disorder ([place])    Chronic low back pain    Cocaine abuse ([place])    Depression    Gout    Gout    HIV (human immunodeficiency virus infection) ([place])    "dx'd ~ 2 yr ago" (09/29/2012)   HIV (human immunodeficiency virus infection) ([place])    HIV disease ([place])    Homelessness    Hypertension    Marijuana abuse    Mental disorder    Schizophrenia ([place])  Past Surgical History:  Procedure Laterality Date   SKIN GRAFT FULL THICKNESS LEG Left ?   POSTERIOR LEFT LEG  AFTER DOG BITES   Family History:  Family History  Problem Relation Age of Onset   Alcoholism Mother    Depression Mother    Alcoholism Brother    Family Psychiatric History: See H&P Social History:  Social History   Substance and Sexual Activity  Alcohol Use Yes   Alcohol/week: 12.0 standard drinks   Types: 12 Cans of beer per week   Comment: Daily. "As much as I can get."      Social History   Substance and Sexual Activity  Drug Use Yes   Types: Marijuana, "Crack" cocaine   Comment: States been drinking 4-40oz beers daily.    Social History   Socioeconomic History   Marital status: Single    Spouse name: Not on file   Number of children: 0   Years of education: Not on file   Highest education level: 9th grade  Occupational History   Occupation: unemployed  Tobacco Use   Smoking status: Every Day    Packs/day: 0.50    Years: 27.00    Pack years: 13.50    Types: Cigarettes   Smokeless tobacco: Never  Vaping Use   Vaping Use: Never used  Substance and Sexual Activity   Alcohol use: Yes    Alcohol/week: 12.0 standard drinks    Types: 12 Cans of beer per week    Comment: Daily. "As much as I can get."   Drug use: Yes    Types: Marijuana, "Crack" cocaine    Comment: States been drinking 4-40oz beers daily.   Sexual activity: Not Currently    Partners: Male    Birth control/protection: Condom    Comment: given condoms 01/2021  Other Topics Concern   Not on file  Social History Narrative   ** Merged History Encounter **       ** Merged History Encounter **       Social Determinants of Health   Financial Resource Strain: High Risk   Difficulty of Paying Living Expenses: Very hard  Food Insecurity: Landscape architect Present   Worried About Charity fundraiser in the Last Year: Sometimes true   Arboriculturist in the Last Year: Sometimes true  Transportation Needs: Public librarian (Medical): Yes   Lack of Transportation (Non-Medical): Yes  Physical Activity: Inactive   Days of Exercise per Week: 0 days   Minutes of Exercise per Session: 0 min  Stress: Stress Concern Present   Feeling of Stress : Rather much  Social Connections: Moderately Isolated   Frequency of Communication with Friends and Family: Three times a week   Frequency of Social Gatherings with Friends and Family: Three times a week    Attends Religious Services: Never   Active Member of Clubs or Organizations: Yes   Attends Music therapist: More than 4 times per year   Marital Status: Never married   SDOH:  SDOH Screenings   Alcohol Screen: Medium Risk   Last Alcohol Screening Score (AUDIT): 31  Depression (PHQ2-9): Low Risk    PHQ-2 Score: 0  Financial Resource Strain: High Risk   Difficulty of Paying Living Expenses: Very hard  Food Insecurity: Food Insecurity Present   Worried About Charity fundraiser in the Last Year: Sometimes true   Ran Out of Food in the Last Year: Sometimes true  Housing: Medium Risk   Last Housing Risk Score: 1  Physical Activity: Inactive   Days of Exercise per Week: 0 days   Minutes of Exercise per Session: 0 min  Social Connections: Moderately Isolated   Frequency of Communication with Friends and Family: Three times a week   Frequency of Social Gatherings with Friends and Family: Three times a week   Attends Religious Services: Never   Active Member of Clubs or Organizations: Yes   Attends Music therapist: More than 4 times per year   Marital Status: Never married  Stress: Stress Concern Present   Feeling of Stress : Rather much  Tobacco Use: High Risk   Smoking Tobacco Use: Every Day   Smokeless Tobacco Use: Never   Passive Exposure: Not on file  Transportation Needs: Unmet Transportation Needs   Lack of Transportation (Medical): Yes   Lack of Transportation (Non-Medical): Yes    Tobacco Cessation:  A prescription for an FDA-approved tobacco cessation medication was offered at discharge and the patient refused  Current Medications:  Current Facility-Administered Medications  Medication Dose Route Frequency Provider Last Rate Last Admin   acetaminophen (TYLENOL) tablet 650 mg  650 mg Oral Q6H PRN Bobbitt, Shalon E, NP       alum & mag hydroxide-simeth (MAALOX/MYLANTA) 200-200-20 MG/5ML suspension 30 mL  30 mL Oral Q4H PRN Bobbitt, Shalon E, NP        busPIRone (BUSPAR) tablet 7.5 mg  7.5 mg Oral BID Bobbitt, Shalon E, NP   7.5 mg at 03/30/21 2125   Darunavir-Cobicistat-Emtricitabine-Tenofovir Alafenamide (SYMTUZA) 800-150-200-10 MG TABS 1 tablet  1 tablet Oral q morning Bobbitt, Shalon E, NP   1 tablet at 03/30/21 0955   magnesium hydroxide (MILK OF MAGNESIA) suspension 30 mL  30 mL Oral Daily PRN Bobbitt, Shalon E, NP       mirtazapine (REMERON) tablet 15 mg  15 mg Oral QHS Bobbitt, Shalon E, NP   15 mg at 03/30/21 2125   QUEtiapine (SEROQUEL) tablet 100 mg  100 mg Oral QHS Bobbitt, Shalon E, NP   100 mg at 03/30/21 2134   traZODone (DESYREL) tablet 100 mg  100 mg Oral QHS Bobbitt, Shalon E, NP   100 mg at 03/30/21 2134   Current Outpatient Medications  Medication Sig Dispense Refill   albuterol (VENTOLIN HFA) 108 (90 Base) MCG/ACT inhaler Inhale 1-2 puffs into the lungs every 6 (six) hours as needed for wheezing or shortness of breath. 6.7 g 2   busPIRone (BUSPAR) 7.5 MG tablet Take 1 tablet (7.5 mg total) by mouth 2 (two) times daily. 60 tablet 1   mirtazapine (REMERON) 15 MG tablet Take 1 tablet (15 mg total) by mouth at bedtime. 30 tablet 1   QUEtiapine (SEROQUEL) 100 MG tablet Take 1 tablet (100 mg total) by mouth at bedtime. 30 tablet 1   SYMTUZA 800-150-200-10 MG TABS Take 1 tablet by mouth every morning. 30 tablet 5   traZODone (DESYREL) 100 MG tablet Take 100 mg by mouth at bedtime.      PTA Medications: (Not in a hospital admission)   Musculoskeletal  Strength & Muscle Tone: within normal limits Gait & Station: normal Patient leans: N/A  Psychiatric Specialty Exam  Presentation  General Appearance: Casual  Eye Contact:Good  Speech:Clear and Coherent; Normal Rate  Speech Volume:Normal  Handedness:Right   Mood and Affect  Mood:Euthymic  Affect:Congruent   Thought Process  Thought Processes:Coherent  Descriptions of Associations:Intact  Orientation:Full (Time, Place and Person)  Thought  Content:Logical  Diagnosis of Schizophrenia or Schizoaffective disorder in past: Yes  Duration of Psychotic Symptoms: Greater than six months   Hallucinations:Hallucinations: None Description of Auditory Hallucinations: voices telling him to kill everyone Description of Visual Hallucinations: sees purple elephantes and blue baloons  Ideas of Reference:None  Suicidal Thoughts:Suicidal Thoughts: No SI Active Intent and/or Plan: With Intent; With Plan; With Means to Carry Out  Homicidal Thoughts:Homicidal Thoughts: No HI Passive Intent and/or Plan: Without Intent; Without Means to Carry Out; Without Plan   Sensorium  Memory:Immediate Good; Recent Good; Remote Good  Judgment:Good  Insight:Good   Executive Functions  Concentration:Good  Attention Span:Good  [place] of Knowledge:Good  Language:Good   Psychomotor Activity  Psychomotor Activity:Psychomotor Activity: Normal   Assets  Assets:Communication Skills; Desire for Improvement; Financial Resources/Insurance; Leisure Time; Physical Health   Sleep  Sleep:Sleep: Fair   No data recorded  Physical Exam  Physical Exam Vitals and nursing note reviewed.  Constitutional:      General: He is not in acute distress.    Appearance: Normal appearance. He is well-developed.  HENT:     Head: Normocephalic and atraumatic.  Eyes:     General:        Right eye: No discharge.        Left eye: No discharge.     Conjunctiva/sclera: Conjunctivae normal.  Cardiovascular:     Rate and Rhythm: Normal rate.  Pulmonary:     Effort: Pulmonary effort is normal. No respiratory distress.  Musculoskeletal:        General: Normal range of motion.     Cervical back: Normal range of motion.  Skin:    Coloration: Skin is not jaundiced or pale.  Neurological:     Mental Status: He is alert and oriented to person, place, and time.  Psychiatric:        Attention and Perception: Attention and perception normal.         Mood and Affect: Mood normal.        Speech: Speech normal.        Behavior: Behavior normal. Behavior is cooperative.        Thought Content: Thought content normal.        Cognition and Memory: Cognition normal.        Judgment: Judgment normal.   Review of Systems  Constitutional: Negative.   HENT: Negative.    Eyes: Negative.   Respiratory: Negative.    Cardiovascular: Negative.   Gastrointestinal: Negative.   Genitourinary: Negative.   Musculoskeletal: Negative.   Skin: Negative.   Neurological: Negative.   Endo/Heme/Allergies: Negative.   Psychiatric/Behavioral: Negative.    Blood pressure 118/63, pulse 87, temperature 98.7 F (37.1 C), temperature source Oral, resp. rate 17, SpO2 97 %. There is no height or weight on file to calculate BMI.  Demographic Factors:  Male, Low socioeconomic status, and Unemployed  Loss Factors: Financial problems/change in socioeconomic status  Historical Factors: Impulsivity  Risk Reduction Factors:   Positive social support and Positive coping skills or problem solving skills  Continued Clinical Symptoms:  Severe Anxiety and/or Agitation Depression:   Comorbid alcohol abuse/dependence Impulsivity Alcohol/Substance Abuse/Dependencies Schizophrenia:   Paranoid or undifferentiated type  Cognitive Features That Contribute To Risk:  None    Suicide Risk:  Minimal: No identifiable suicidal ideation.  Patients presenting with no risk factors but with morbid ruminations; may be classified as minimal risk based on the severity of the depressive symptoms  Plan Of Care/Follow-up recommendations:  Activity:  as tolerated  Diet:  regular   Disposition: Discharge patient   Follow up with out patient provider Trinna Post PA  2 week printed prescription was provided for Seroquel 100 mg QHS, Remeron 15 mg QHS, and Buspirone 7.5mg  QHS.   No evidence of imminent risk to self or others at present.    Patient does not meet criteria for  psychiatric inpatient admission. Discussed crisis plan, support from social network, calling 911, coming to the Emergency Department, and calling Suicide Hotline.    Revonda Humphrey, NP 03/31/2021, 10:02 AM

## 2021-04-06 NOTE — Progress Notes (Signed)
Virtual Visit via Telephone Note  I connected with John [name] on 04/06/21 at  9:00 AM EST by telephone and verified that I am speaking with the correct person using two identifiers.  Location: Patient: home Provider: Office    I discussed the limitations, risks, security and privacy concerns of performing an evaluation and management service by telephone and the availability of in person appointments. I also discussed with the patient that there may be a patient responsible charge related to this service. The patient expressed understanding and agreed to proceed.    I discussed the assessment and treatment plan with the patient. The patient was provided an opportunity to ask questions and all were answered. The patient agreed with the plan and demonstrated an understanding of the instructions.   The patient was advised to call back or seek an in-person evaluation if the symptoms worsen or if the condition fails to improve as anticipated.  I provided 15 minutes of non-face-to-face time during this encounter.   Derrill Center, NP   [facility] MD/PA/NP OP Progress Note  04/06/2021 4:54 PM John [name]  MRN:  [id number]  Chief Complaint:  John [name] reported " I have not taken my medication in the past 2 months."   HPI:  John [name] was evaluated telephonically.  He reports a recent evaluation by the local urgent care facility for medication management.  States he was provided Environmental consultant by [facility]) [place] However, states that they no longer communicate.  Patient is requesting for all medications to be sent [facility] [place] closer to where he is currently residing.  John [name] is denying suicidal or homicidal ideations.  Denies auditory or visual hallucinations.  Reported recent inpatient admission due to substance abuse.  He denied that he has followed up with outpatient resources for substance abuse. "  I will just not right now."  He denied depression or depressive  symptoms.  States overall his mood has improved.  Reports he has not been taking his medications for the past 2 months.  reports a good appetite.  States he is resting well throughout the night.  Support, encouragement and reassurance was provided.    Visit Diagnosis:    ICD-10-CM   1. Bipolar disorder, curr episode mixed, severe, with psychotic features ([place])  F31.64     2. Generalized anxiety disorder  F41.1       Past Psychiatric History:   Past Medical History:  Past Medical History:  Diagnosis Date   Alcohol abuse    Alcoholism ([place])    Anxiety    Asthma    Asthma    Bipolar disorder ([place])    Chronic low back pain    Cocaine abuse ([place])    Depression    Gout    Gout    HIV (human immunodeficiency virus infection) ([place])    "dx'd ~ 2 yr ago" (09/29/2012)   HIV (human immunodeficiency virus infection) ([place])    HIV disease ([place])    Homelessness    Hypertension    Marijuana abuse    Mental disorder    Schizophrenia ([place])     Past Surgical History:  Procedure Laterality Date   SKIN GRAFT FULL THICKNESS LEG Left ?   POSTERIOR LEFT LEG  AFTER DOG BITES    Family Psychiatric History:   Family History:  Family History  Problem Relation Age of Onset   Alcoholism Mother    Depression Mother    Alcoholism Brother     Social History:  Social History   Socioeconomic History   Marital status: Single    Spouse name: Not on file   Number of children: 0   Years of education: Not on file   Highest education level: 9th grade  Occupational History   Occupation: unemployed  Tobacco Use   Smoking status: Every Day    Packs/day: 0.50    Years: 27.00    Pack years: 13.50    Types: Cigarettes   Smokeless tobacco: Never  Vaping Use   Vaping Use: Never used  Substance and Sexual Activity   Alcohol use: Yes    Alcohol/week: 12.0 standard drinks    Types: 12 Cans of beer per week    Comment: Daily. "As much as I can get."   Drug use: Yes    Types: Marijuana,  "Crack" cocaine    Comment: States been drinking 4-40oz beers daily.   Sexual activity: Not Currently    Partners: Male    Birth control/protection: Condom    Comment: given condoms 01/2021  Other Topics Concern   Not on file  Social History Narrative   ** Merged History Encounter **       ** Merged History Encounter **       Social Determinants of Health   Financial Resource Strain: High Risk   Difficulty of Paying Living Expenses: Very hard  Food Insecurity: Landscape architect Present   Worried About Charity fundraiser in the Last Year: Sometimes true   Arboriculturist in the Last Year: Sometimes true  Transportation Needs: Public librarian (Medical): Yes   Lack of Transportation (Non-Medical): Yes  Physical Activity: Inactive   Days of Exercise per Week: 0 days   Minutes of Exercise per Session: 0 min  Stress: Stress Concern Present   Feeling of Stress : Rather much  Social Connections: Moderately Isolated   Frequency of Communication with Friends and Family: Three times a week   Frequency of Social Gatherings with Friends and Family: Three times a week   Attends Religious Services: Never   Active Member of Clubs or Organizations: Yes   Attends Archivist Meetings: More than 4 times per year   Marital Status: Never married    Allergies:  Allergies  Allergen Reactions   Shellfish Allergy Anaphylaxis and Swelling    Metabolic Disorder Labs: Lab Results  Component Value Date   HGBA1C 5.2 12/23/2020   MPG 102.54 12/23/2020   MPG 102.54 09/18/2020   Lab Results  Component Value Date   PROLACTIN 6.7 09/29/2012   Lab Results  Component Value Date   CHOL 158 03/29/2021   TRIG 73 03/29/2021   HDL 70 03/29/2021   CHOLHDL 2.3 03/29/2021   VLDL 15 03/29/2021   LDLCALC 73 03/29/2021   LDLCALC 94 09/18/2020   Lab Results  Component Value Date   TSH 2.169 03/29/2021   TSH 5.149 (H) 09/18/2020    Therapeutic Level  Labs: No results found for: LITHIUM Lab Results  Component Value Date   VALPROATE 59 04/06/2015   VALPROATE 95 11/18/2014   No components found for:  CBMZ  Current Medications: Current Outpatient Medications  Medication Sig Dispense Refill   albuterol (VENTOLIN HFA) 108 (90 Base) MCG/ACT inhaler Inhale 1-2 puffs into the lungs every 6 (six) hours as needed for wheezing or shortness of breath. 6.7 g 2   busPIRone (BUSPAR) 7.5 MG tablet Take 1 tablet (7.5 mg total) by mouth 2 (two) times  daily. 28 tablet 0   mirtazapine (REMERON) 15 MG tablet Take 1 tablet (15 mg total) by mouth at bedtime. 14 tablet 0   QUEtiapine (SEROQUEL) 100 MG tablet Take 1 tablet (100 mg total) by mouth at bedtime. 14 tablet 0   SYMTUZA 800-150-200-10 MG TABS Take 1 tablet by mouth every morning. 30 tablet 5   traZODone (DESYREL) 100 MG tablet Take 100 mg by mouth at bedtime.     No current facility-administered medications for this visit.     Musculoskeletal:   Psychiatric Specialty Exam: Review of Systems  There were no vitals taken for this visit.There is no height or weight on file to calculate BMI.  General Appearance: NA  Eye Contact:  NA  Speech:  Clear and Coherent  Volume:  Normal  Mood:  Depressed  Affect:  Congruent  Thought Process:  Coherent  Orientation:  Full (Time, Place, and Person)  Thought Content: Logical   Suicidal Thoughts:  No  Homicidal Thoughts:  No  Memory:  Immediate;   Good Recent;   Good  Judgement:  Good  Insight:  Good  Psychomotor Activity:  Normal  Concentration:  Concentration: Good  Recall:  Good  Fund of Knowledge: Good  Language: Good  Akathisia:  Yes  Handed:  Right  AIMS (if indicated): done  Assets:  Communication Skills Desire for Improvement Resilience Social Support  ADL's:  Intact  Cognition: WNL  Sleep:  NA   Screenings: AIMS    Flowsheet Row Admission (Discharged) from 09/17/2020 in [facility] Admission  (Discharged) from 03/23/2019 in [place] [facility] Admission (Discharged) from 03/31/2015 in [place] [facility] Admission (Discharged) from 11/14/2014 in [place] Total Score 0 0 0 0      AUDIT    Flowsheet Row Admission (Discharged) from 09/17/2020 in [place] [facility] Counselor from [facility]/22/2020 in [facility] Admission (Discharged) from 03/23/2019 in [place] [facility] Admission (Discharged) from 03/31/2015 in [place] [facility] Admission (Discharged) from 11/14/2014 in [place]  Alcohol Use Disorder Identification Test Final Score (AUDIT) 31 32 28 12 12       GAD-7    Flowsheet Row Office Visit from 11/24/2020 in [facility] Office Visit from 10/03/2020 in [facility] Office Visit from 08/01/2020 in [facility] Office Visit from 05/22/2020 in [facility]  Total GAD-7 Score 16 18 9 19       PHQ2-9    [place] Office Visit from 02/10/2021 in [facility] for Infectious Disease Office Visit from 11/24/2020 in [facility] Office Visit from 10/27/2020 in [facility] for Infectious Disease Office Visit from 10/03/2020 in [facility] Office Visit from 08/07/2020 in [facility] for Infectious Disease  PHQ-2 Total Score 0 5 0 4 0  PHQ-9 Total Score -- 18 -- 18 --      Flowsheet Row ED from 03/29/2021 in [facility] from 03/09/2021 in [place] [facility] ED from 12/31/2020 in [place] DEPT  C-SSRS RISK CATEGORY High Risk High Risk High Risk        Assessment and Plan:  Jaxen Samples is a  51 year old male was evaluated telephonically.  Charted history with bipolar  major depressive disorder and substance abuse history.  He reports his mood has been stable since his recent discharge.  Questionable medication compliance.  Patient has outpatient resources for substance abuse treatment.  Follow-up 4 weeks.  Support, encouragement and  reassurance was provided.   Bipolar Disorder Major Depression Generalize Anxiety Disorder:  Continue Seroquel 100 mg p.o. nightly Continue BuSpar 7.5 mg p.o. twice daily Continue Remeron 15 mg p.o. nightly Discontinue trazodone as needed 100 mg nightly  Follow-up 4 weeks for medication management  Derrill Center, NP 04/06/2021, 4:54 PM

## 2021-04-07 ENCOUNTER — Telehealth (INDEPENDENT_AMBULATORY_CARE_PROVIDER_SITE_OTHER): Payer: Medicaid Other | Admitting: Family

## 2021-04-07 DIAGNOSIS — F3164 Bipolar disorder, current episode mixed, severe, with psychotic features: Secondary | ICD-10-CM

## 2021-04-07 DIAGNOSIS — F411 Generalized anxiety disorder: Secondary | ICD-10-CM

## 2021-04-07 DIAGNOSIS — F4310 Post-traumatic stress disorder, unspecified: Secondary | ICD-10-CM

## 2021-04-07 DIAGNOSIS — F5105 Insomnia due to other mental disorder: Secondary | ICD-10-CM | POA: Diagnosis not present

## 2021-04-07 DIAGNOSIS — F99 Mental disorder, not otherwise specified: Secondary | ICD-10-CM

## 2021-04-08 ENCOUNTER — Other Ambulatory Visit: Payer: Self-pay

## 2021-04-08 ENCOUNTER — Encounter (HOSPITAL_COMMUNITY): Payer: Self-pay | Admitting: Physician Assistant

## 2021-04-08 ENCOUNTER — Observation Stay (HOSPITAL_COMMUNITY)
Admission: EM | Admit: 2021-04-08 | Discharge: 2021-04-09 | Disposition: A | Discharge status: Home / Self Care | Payer: Medicaid Other | Attending: Internal Medicine | Admitting: Internal Medicine

## 2021-04-08 ENCOUNTER — Emergency Department (HOSPITAL_COMMUNITY): Payer: Medicaid Other

## 2021-04-08 DIAGNOSIS — R45851 Suicidal ideations: Secondary | ICD-10-CM | POA: Insufficient documentation

## 2021-04-08 DIAGNOSIS — R0602 Shortness of breath: Secondary | ICD-10-CM | POA: Diagnosis present

## 2021-04-08 DIAGNOSIS — Z21 Asymptomatic human immunodeficiency virus [HIV] infection status: Secondary | ICD-10-CM | POA: Diagnosis not present

## 2021-04-08 DIAGNOSIS — J45901 Unspecified asthma with (acute) exacerbation: Secondary | ICD-10-CM | POA: Diagnosis not present

## 2021-04-08 DIAGNOSIS — Z5900 Homelessness unspecified: Secondary | ICD-10-CM | POA: Diagnosis not present

## 2021-04-08 DIAGNOSIS — Z20822 Contact with and (suspected) exposure to covid-19: Secondary | ICD-10-CM | POA: Insufficient documentation

## 2021-04-08 DIAGNOSIS — I10 Essential (primary) hypertension: Secondary | ICD-10-CM | POA: Diagnosis not present

## 2021-04-08 HISTORY — DX: Unspecified asthma with (acute) exacerbation: J45.901

## 2021-04-08 LAB — TROPONIN I (HIGH SENSITIVITY)
Troponin I (High Sensitivity): 3 ng/L (ref <18)
Troponin I (High Sensitivity): 3 ng/L (ref <18)

## 2021-04-08 LAB — BASIC METABOLIC PANEL
Anion gap: 12 (ref 5–15)
BUN: 10 mg/dL (ref 6–20)
CO2: 20 mmol/L — ABNORMAL LOW (ref 22–32)
Calcium: 9.2 mg/dL (ref 8.9–10.3)
Chloride: 102 mmol/L (ref 98–111)
Creatinine, Ser: 0.77 mg/dL (ref 0.61–1.24)
GFR, Estimated: >60 mL/min (ref >60)
Glucose, Bld: 71 mg/dL (ref 70–99)
Potassium: 3.8 mmol/L (ref 3.5–5.1)
Sodium: 134 mmol/L — ABNORMAL LOW (ref 135–145)

## 2021-04-08 LAB — I-STAT VENOUS BLOOD GAS, ED
Acid-base deficit: 2 mmol/L (ref 0.0–2.0)
Bicarbonate: 22.1 mmol/L (ref 20.0–28.0)
Calcium, Ion: 1.14 mmol/L — ABNORMAL LOW (ref 1.15–1.40)
HCT: 42 % (ref 39.0–52.0)
Hemoglobin: 14.3 g/dL (ref 13.0–17.0)
O2 Saturation: 81 %
Potassium: 4.1 mmol/L (ref 3.5–5.1)
Sodium: 137 mmol/L (ref 135–145)
TCO2: 23 mmol/L (ref 22–32)
pCO2, Ven: 36.4 mmHg — ABNORMAL LOW (ref 44.0–60.0)
pH, Ven: 7.392 (ref 7.250–7.430)
pO2, Ven: 45 mmHg (ref 32.0–45.0)

## 2021-04-08 LAB — CBC WITH DIFFERENTIAL/PLATELET
Abs Immature Granulocytes: 0.01 10*3/uL (ref 0.00–0.07)
Basophils Absolute: 0 10*3/uL (ref 0.0–0.1)
Basophils Relative: 1 %
Eosinophils Absolute: 0.3 10*3/uL (ref 0.0–0.5)
Eosinophils Relative: 7 %
HCT: 42.4 % (ref 39.0–52.0)
Hemoglobin: 14 g/dL (ref 13.0–17.0)
Immature Granulocytes: 0 %
Lymphocytes Relative: 37 %
Lymphs Abs: 1.6 10*3/uL (ref 0.7–4.0)
MCH: 31.1 pg (ref 26.0–34.0)
MCHC: 33 g/dL (ref 30.0–36.0)
MCV: 94.2 fL (ref 80.0–100.0)
Monocytes Absolute: 0.5 10*3/uL (ref 0.1–1.0)
Monocytes Relative: 10 %
Neutro Abs: 2 10*3/uL (ref 1.7–7.7)
Neutrophils Relative %: 45 %
Platelets: 359 10*3/uL (ref 150–400)
RBC: 4.5 MIL/uL (ref 4.22–5.81)
RDW: 11.7 % (ref 11.5–15.5)
WBC: 4.5 10*3/uL (ref 4.0–10.5)
nRBC: 0 % (ref 0.0–0.2)

## 2021-04-08 LAB — RESP PANEL BY RT-PCR (FLU A&B, COVID) ARPGX2
Influenza A by PCR: NEGATIVE
Influenza B by PCR: NEGATIVE
SARS Coronavirus 2 by RT PCR: NEGATIVE

## 2021-04-08 LAB — BRAIN NATRIURETIC PEPTIDE: B Natriuretic Peptide: 76.2 pg/mL (ref 0.0–100.0)

## 2021-04-08 MED ORDER — METHYLPREDNISOLONE SODIUM SUCC 125 MG IJ SOLR
125.0000 mg | Freq: Once | INTRAMUSCULAR | Status: AC
  Administered 2021-04-08: 11:00:00 125 mg via INTRAVENOUS
  Filled 2021-04-08: qty 2

## 2021-04-08 MED ORDER — NICOTINE 7 MG/24HR TD PT24: 7.0000 mg | MEDICATED_PATCH | Freq: Every day | TRANSDERMAL | Status: DC

## 2021-04-08 MED ORDER — DARUN-COBIC-EMTRICIT-TENOFAF 800-150-200-10 MG PO TABS
1.0000 | ORAL_TABLET | Freq: Every morning | ORAL | Status: DC
  Administered 2021-04-09 (×2): 1 via ORAL
  Filled 2021-04-08 (×3): qty 1

## 2021-04-08 MED ORDER — MIRTAZAPINE 15 MG PO TABS: 15.0000 mg | ORAL_TABLET | Freq: Every day | ORAL | 0 refills | Status: DC

## 2021-04-08 MED ORDER — ACETAMINOPHEN 325 MG PO TABS: 650.0000 mg | ORAL_TABLET | Freq: Four times a day (QID) | ORAL | Status: DC | PRN

## 2021-04-08 MED ORDER — ALBUTEROL SULFATE (2.5 MG/3ML) 0.083% IN NEBU
5.0000 mg | INHALATION_SOLUTION | RESPIRATORY_TRACT | Status: AC
  Administered 2021-04-08: 14:00:00 5 mg via RESPIRATORY_TRACT
  Filled 2021-04-08: qty 6

## 2021-04-08 MED ORDER — BUSPIRONE HCL 15 MG PO TABS
7.5000 mg | ORAL_TABLET | Freq: Two times a day (BID) | ORAL | Status: DC
  Administered 2021-04-09 (×2): 7.5 mg via ORAL
  Filled 2021-04-08 (×3): qty 1

## 2021-04-08 MED ORDER — ENOXAPARIN SODIUM 40 MG/0.4ML IJ SOSY: 40.0000 mg | PREFILLED_SYRINGE | INTRAMUSCULAR | Status: DC

## 2021-04-08 MED ORDER — ACETAMINOPHEN 650 MG RE SUPP: 650.0000 mg | Freq: Four times a day (QID) | RECTAL | Status: DC | PRN

## 2021-04-08 MED ORDER — ALBUTEROL SULFATE (2.5 MG/3ML) 0.083% IN NEBU
5.0000 mg/h | INHALATION_SOLUTION | Freq: Once | RESPIRATORY_TRACT | Status: AC
  Administered 2021-04-08: 11:00:00 5 mg/h via RESPIRATORY_TRACT
  Filled 2021-04-08: qty 3

## 2021-04-08 MED ORDER — PREDNISONE 20 MG PO TABS
40.0000 mg | ORAL_TABLET | Freq: Every day | ORAL | Status: DC
  Administered 2021-04-09: 40 mg via ORAL
  Filled 2021-04-08: qty 2

## 2021-04-08 MED ORDER — MAGNESIUM SULFATE 2 GM/50ML IV SOLN
2.0000 g | Freq: Once | INTRAVENOUS | Status: AC
  Administered 2021-04-08: 11:00:00 2 g via INTRAVENOUS
  Filled 2021-04-08: qty 50

## 2021-04-08 MED ORDER — BUSPIRONE HCL 7.5 MG PO TABS: 7.5000 mg | ORAL_TABLET | Freq: Two times a day (BID) | ORAL | 0 refills | Status: DC

## 2021-04-08 MED ORDER — QUETIAPINE FUMARATE 100 MG PO TABS: 100.0000 mg | ORAL_TABLET | Freq: Every day | ORAL | 0 refills | Status: DC

## 2021-04-08 MED ORDER — MIRTAZAPINE 15 MG PO TABS
15.0000 mg | ORAL_TABLET | Freq: Every day | ORAL | Status: DC
  Administered 2021-04-08: 23:00:00 15 mg via ORAL
  Filled 2021-04-08 (×2): qty 1

## 2021-04-08 MED ORDER — QUETIAPINE FUMARATE 100 MG PO TABS
100.0000 mg | ORAL_TABLET | Freq: Every day | ORAL | Status: DC
  Administered 2021-04-08: 23:00:00 100 mg via ORAL
  Filled 2021-04-08 (×2): qty 1

## 2021-04-08 MED ORDER — IPRATROPIUM-ALBUTEROL 0.5-2.5 (3) MG/3ML IN SOLN: 3.0000 mL | RESPIRATORY_TRACT | Status: DC | PRN

## 2021-04-08 NOTE — ED Notes (Signed)
Diet[facility] called for tray delivery

## 2021-04-08 NOTE — ED Provider Triage Note (Signed)
Emergency Medicine Provider Triage Evaluation Note  John [name] , a 51 y.o. male  was evaluated in triage.  Pt complains of wheezing and shortness of breath.  He has a history of asthma.  Patient was seen by EMS this morning and given to nebulizer treatments prior to arrival.  He is a daily smoker.  He denies fevers, chills, chest pain.  Review of Systems  Positive: Wheezing Negative: Fever  Physical Exam  BP 124/88 (BP Location: Right Arm)    Pulse 84    Temp 98.4 F (36.9 C) (Oral)    Resp 20    SpO2 97%  Gen:   Awake, no distress   Resp:  Prolonged expiratory phase, very poor air movement, increased work of breathing MSK:   Moves extremities without difficulty  Other:  Jugular venous distention  Medical Decision Making  Medically screening exam initiated at 10:21 AM.  Appropriate orders placed.  John [name] was informed that the remainder of the evaluation will be completed by another provider, this initial triage assessment does not replace that evaluation, and the importance of remaining in the ED until their evaluation is complete.  Orders placed   John [name], [place], PA-C 04/08/21 1023

## 2021-04-08 NOTE — H&P (Addendum)
Date: 04/08/2021               Patient Name:  John [name] MRN: [id number]  DOB: 09-28-1970 Age / Sex: 51 y.o., male   PCP: Pcp, No         Medical Service: Internal Medicine Teaching Service         Attending Physician: Dr. Charise Killian, MD    First Contact: Dr. Lorin Glass Pager: [phone]  Second Contact: Dr. Alfonse Spruce Pager: [phone]       After Hours (After 5p/  First Contact Pager: [phone]  weekends / holidays): Second Contact Pager: [phone]   Chief Complaint: wheezing, SOB  History of Present Illness: John [name] is a 51 y.o. male with a PMHx of asthma, anxiety, HIV, depression, HTN, alcoholism, homelessness, cocaine abuse, and bipolar disorder who presents to [facility] with chief complaint of wheezing and shortness of breath.  The patient states that he is typically on steroids and albuterol at home, however he ran out of his steroids 3-4 months ago, and he will have his albuterol inhaler 2 days ago.  Not on a maintenance inhaler at home.  He does not see PCP regularly.  In the past few days, the patient has noticed worsening shortness of breath and worsening cough with clear sputum.  Also noticed he was wheezing more. No CP, N/V, abd pain, leg swelling, or nasal congestion.  He has never been hospitalized for an asthma exacerbation.  Denies recent illness or sick contacts.  He endorses some difficulty with urination in terms of initiating a stream.  This has been an intermittent issue for him for a while.  He also has regular follow-ups with infectious disease for his HIV.  The patient also endorses a history of bipolar and schizophrenia for which she is on several medications.  He often sees hallucinations of "pink elephants" and "someone just lying there."  He has attempted suicide in the past and currently endorses SI as well as thoughts of hurting others.  When asked if he has a particular plan to hurt himself, he states "by any means necessary, like running in front of a car."   No other complaints or concerns today.  Meds:  Current Meds  Medication Sig   albuterol (VENTOLIN HFA) 108 (90 Base) MCG/ACT inhaler Inhale 1-2 puffs into the lungs every 6 (six) hours as needed for wheezing or shortness of breath.   SYMTUZA 800-150-200-10 MG TABS Take 1 tablet by mouth every morning.     Allergies: Allergies as of 04/08/2021 - Review Complete 04/08/2021  Allergen Reaction Noted   Shellfish allergy Anaphylaxis and Swelling 03/14/2011   Past Medical History:  Diagnosis Date   Alcohol abuse    Alcoholism ([place])    Anxiety    Asthma    Asthma    Bipolar disorder ([place])    Chronic low back pain    Cocaine abuse ([place])    Depression    Gout    Gout    HIV (human immunodeficiency virus infection) ([place])    "dx'd ~ 2 yr ago" (09/29/2012)   HIV (human immunodeficiency virus infection) ([place])    HIV disease ([place])    Homelessness    Hypertension    Marijuana abuse    Mental disorder    Schizophrenia ([place])     Family History: Grandma possibly had diabetes, unsure of other medical conditions that run in the family.  Social History: He lives with his husband in [place].  He smokes  half a pack a day.  Used to smoke a pack a day but recently cut back.  Has smoked tobacco since he was 51 years old.  He also smokes marijuana "every chance he gets."  No other drug use.  No alcohol use.  Review of Systems: A complete ROS was negative except as per HPI.   Physical Exam: Blood pressure 124/90, pulse 85, temperature 98.4 F (36.9 C), temperature source Oral, resp. rate 17, SpO2 98 %. General: NAD, nl appearance, lying comfortably in hospital bed on room air HE: Normocephalic, atraumatic, EOMI, Conjunctivae normal ENT: No congestion, no rhinorrhea, no exudate or erythema  Cardiovascular: Normal rate, regular rhythm. No murmurs, rubs, or gallops Pulmonary: Effort normal on RA.  Scattered end expiratory wheezes, but no other adventitious breath sounds  appreciated Abdominal: soft, nontender, bowel sounds present Musculoskeletal: no swelling, deformity, injury or tenderness in extremities Skin: Warm, dry, no bruising, erythema, or rash Psychiatric/Behavioral: Endorses SI  EKG: personally reviewed my interpretation is NSR with sinus arrhythmia  CXR: personally reviewed my interpretation is no radiographic evidence of acute cardiopulmonary process.  Assessment & Plan by Problem: Active Problems:   Asthma exacerbation  #Asthma exacerbation The patient presents here with worsening wheezing, cough, and shortness of breath in the setting of running out of his home albuterol and steroid regimens recently.  Denies recent illness.  On arrival, he was satting well on room air and was afebrile and hemodynamically stable.  Exam is notable for scattered end expiratory wheezing.  In the ED, patient was given nebulized albuterol, Solu-Medrol, and IV magnesium.  Chest x-ray is unremarkable.  At this time, we will give DuoNebs as needed and daily prednisone while continuing to monitor his respiratory status. -DuoNebs every 4 hours as needed -Prednisone on 40 mg daily  #Suicidal ideation #Bipolar disorder #Major depression with psychotic features #GAD The patient endorses prior suicide attempt as well as current suicidal ideation.  Also states that he sometimes has thoughts of harming others.  He does not have a specific plan but states that he would hurt himself "by any means necessary."  Patient is aware that it is not safe for him to leave the hospital at this time.  Will otherwise continue his home medications as listed below. -Consulted psychiatry, appreciate their recommendations -Resume quetiapine 100 mg p.o. daily -Resume mirtazapine 15 mg p.o. daily -Resume BuSpar 7.5 mg p.o. twice daily  #HIV  Patient has a history of HIV diagnosed in February 2012.  He is followed by infectious disease and is currently taking Symtuza.  Last viral loads in  November 2020 were undetectable.  CD4 count of 661 at that time. -Resume Symtuza 800-150-2 100-10 mg p.o. daily  Dispo: Admit patient to Observation with expected length of stay less than 2 midnights.  Signed: Orvis Brill, MD 04/08/2021, 2:50 PM  Pager: [phone]  After 5pm on weekdays and 1pm on weekends: On Call pager: [phone]

## 2021-04-08 NOTE — ED Triage Notes (Signed)
EMS stated, He wants to be evaluated cause he was SOB and out of his inhaler.

## 2021-04-08 NOTE — ED Notes (Signed)
Admitting MD at bedside.

## 2021-04-08 NOTE — ED Provider Notes (Signed)
[facility] EMERGENCY DEPARTMENT Provider Note   CSN: [id number] Arrival date & time: 04/08/21  0946     History  Chief Complaint  Patient presents with   Shortness of Breath    John [name] is a 51 y.o. male.   Shortness of Breath Associated symptoms: wheezing    51 year old male with a history of asthma, anxiety, HIV, depression, HTN, alcoholism, homelessness, cocaine abuse, bipolar disorder who presents to the emergency department with multiple complaints, primarily respiratory distress.  The patient endorses worsening shortness of breath over the past few days.  He states he ran out of his home inhaler and cannot in afford a new inhaler.  Additionally, he complains of suicidal ideation with a plan "to end my life finding means necessary."  He states he has been having these thoughts for the past few days.  Denies any chest pain.  He denies any sick contacts.  He denies any fevers or chills.  Home Medications Prior to Admission medications   Medication Sig Start Date End Date Taking? Authorizing Provider  albuterol (VENTOLIN HFA) 108 (90 Base) MCG/ACT inhaler Inhale 1-2 puffs into the lungs every 6 (six) hours as needed for wheezing or shortness of breath. 01/25/21  Yes Wynona Dove A, DO  SYMTUZA 800-150-200-10 MG TABS Take 1 tablet by mouth every morning. 02/10/21  Yes Golden Circle, FNP  busPIRone (BUSPAR) 7.5 MG tablet Take 1 tablet (7.5 mg total) by mouth 2 (two) times daily. 04/08/21 04/08/22  Derrill Center, NP  mirtazapine (REMERON) 15 MG tablet Take 1 tablet (15 mg total) by mouth at bedtime. 04/08/21 05/08/21  Derrill Center, NP  QUEtiapine (SEROQUEL) 100 MG tablet Take 1 tablet (100 mg total) by mouth at bedtime. 04/08/21 05/08/21  Derrill Center, NP  dicyclomine (BENTYL) 20 MG tablet Take 1 tablet (20 mg total) by mouth 2 (two) times daily. 01/08/17 01/03/20  Palumbo, April, MD  sucralfate (CARAFATE) 1 GM/10ML suspension Take 10 mLs (1 g total) by  mouth 4 (four) times daily -  with meals and at bedtime. Patient not taking: Reported on 10/21/2017 01/08/17 09/03/18  Palumbo, April, MD      Allergies    Shellfish allergy    Review of Systems   Review of Systems  Respiratory:  Positive for shortness of breath and wheezing.   Psychiatric/Behavioral:  Positive for suicidal ideas.   All other systems reviewed and are negative.  Physical Exam Updated Vital Signs BP 124/90    Pulse 85    Temp 98.4 F (36.9 C) (Oral)    Resp 17    SpO2 98%  Physical Exam Vitals and nursing note reviewed.  Constitutional:      General: He is not in acute distress.    Appearance: He is well-developed.  HENT:     Head: Normocephalic and atraumatic.  Eyes:     Conjunctiva/sclera: Conjunctivae normal.  Cardiovascular:     Rate and Rhythm: Normal rate and regular rhythm.     Heart sounds: No murmur heard. Pulmonary:     Effort: Pulmonary effort is normal. Tachypnea present. No respiratory distress.     Breath sounds: Examination of the right-upper field reveals wheezing. Examination of the left-upper field reveals wheezing. Examination of the right-middle field reveals wheezing. Examination of the left-middle field reveals wheezing. Examination of the right-lower field reveals wheezing. Examination of the left-lower field reveals wheezing. Decreased breath sounds and wheezing present.     Comments: Tachypnea with diffuse wheezing in  all lung fields, decreased air movement Abdominal:     Palpations: Abdomen is soft.     Tenderness: There is no abdominal tenderness.  Musculoskeletal:        General: No swelling.     Cervical back: Neck supple.  Skin:    General: Skin is warm and dry.     Capillary Refill: Capillary refill takes less than 2 seconds.  Neurological:     Mental Status: He is alert.  Psychiatric:        Mood and Affect: Mood normal.        Thought Content: Thought content includes suicidal ideation. Thought content does not include  suicidal plan.     Comments: Suicidal ideation without a clear plan    ED Results / Procedures / Treatments   Labs (all labs ordered are listed, but only abnormal results are displayed) Labs Reviewed  BASIC METABOLIC PANEL - Abnormal; Notable for the following components:      Result Value   Sodium 134 (*)    CO2 20 (*)    All other components within normal limits  RESP PANEL BY RT-PCR (FLU A&B, COVID) ARPGX2  CBC WITH DIFFERENTIAL/PLATELET  BRAIN NATRIURETIC PEPTIDE  I-STAT VENOUS BLOOD GAS, ED  TROPONIN I (HIGH SENSITIVITY)  TROPONIN I (HIGH SENSITIVITY)    EKG EKG Interpretation  Date/Time:  Wednesday April 08 2021 10:24:50 EST Ventricular Rate:  78 PR Interval:  136 QRS Duration: 92 QT Interval:  382 QTC Calculation: 435 R Axis:   -1 Text Interpretation: Normal sinus rhythm with sinus arrhythmia Minimal voltage criteria for LVH, may be normal variant ( Cornell product ) Borderline ECG When compared with ECG of 08-Apr-2021 10:03, PREVIOUS ECG IS PRESENT Confirmed by Regan Lemming ([id number]) on 04/08/2021 1:09:22 PM  Radiology DG Chest Port 1 View  Result Date: 04/08/2021 CLINICAL DATA:  Shortness of breath EXAM: PORTABLE CHEST 1 VIEW COMPARISON:  Chest radiograph 01/25/2021 FINDINGS: The cardiomediastinal silhouette is within normal limits. There is no focal consolidation or pulmonary edema. There is no pleural effusion or pneumothorax There is no acute osseous abnormality. IMPRESSION: No radiographic evidence of acute cardiopulmonary process. Electronically Signed   By: Valetta Mole M.D.   On: 04/08/2021 10:53    Procedures Procedures    Medications Ordered in ED Medications  albuterol (PROVENTIL) (2.5 MG/3ML) 0.083% nebulizer solution 5 mg (has no administration in time range)  albuterol (PROVENTIL) (2.5 MG/3ML) 0.083% nebulizer solution (5 mg/hr Nebulization Given 04/08/21 1117)  methylPREDNISolone sodium succinate (SOLU-MEDROL) 125 mg/2 mL injection 125 mg (125 mg  Intravenous Given 04/08/21 1113)  magnesium sulfate IVPB 2 g 50 mL (0 g Intravenous Stopped 04/08/21 1254)    ED Course/ Medical Decision Making/ A&P                           Medical Decision Making Risk Prescription drug management. Decision regarding hospitalization.   51 year old male with a history of asthma, anxiety, HIV, depression, HTN, alcoholism, homelessness, cocaine abuse, bipolar disorder who presents to the emergency department with multiple complaints, primarily respiratory distress.  The patient endorses worsening shortness of breath over the past few days.  He states he ran out of his home inhaler and cannot in afford a new inhaler.  Additionally, he complains of suicidal ideation with a plan "to end my life finding means necessary."  He states he has been having these thoughts for the past few days.  Denies any chest pain.  He denies any sick contacts.  He denies any fevers or chills.  On arrival, the patient was afebrile, hemodynamically stable, saturating well on room air.  Normal sinus rhythm was noted on cardiac telemetry.  The patient had a physical exam on arrival concerning for acute asthma exacerbation with diffuse wheezing in all lung fields, tachypnea, decreased air movement present.  IV access was obtained and the patient was administered nebulized albuterol, Solu-Medrol, IV magnesium.  A chest x-ray was performed which was reviewed by myself and radiology was revealed no acute cardiac or pulmonary abnormality.  The patient complains of no chest pain at this time.  Low suspicion for ACS or PE.  He does complain of vague suicidal ideation with a "plan to end my life finding means necessary."  He does screen high risk by the [place] suicide risk rating tool and does present here voluntarily.  Suspect some social concerns such as homelessness could be exacerbating the patient's current mental health crisis.  Additionally, the patient presents with concern for an acute asthma  exacerbation which we will continue to treat with nebulized albuterol. Laboratory evaluation reviewed significant for initial negative troponin, COVID-19 and influenza PCR testing negative, BMP with mild non-anion gap acidosis with a bicarbonate of 20, otherwise no significant electrolyte abnormality, CBC unremarkable and BNP normal.   On reassessment, the patient continued to have diffuse expiratory wheezing in all lung fields and states that he does not feel much improved.  This is despite multiple initial interventions as detailed above.   Due to this concern, medicine was consulted for admission for acute asthma exacerbation with a plan for TTS consultation inpatient.  Final Clinical Impression(s) / ED Diagnoses Final diagnoses:  Exacerbation of asthma, unspecified asthma severity, unspecified whether persistent  Suicidal ideation    Rx / DC Orders ED Discharge Orders     None         Regan Lemming, MD 04/08/21 1426

## 2021-04-08 NOTE — Hospital Course (Signed)
-  last couple days, more SOB. Last night, cough, SOB, clear sputum, wheezing.   No CP, N/V, abd pain, leg swelling, congestion   -never hospitalized for asthma exa -? Dx asthma.  -Albuterol from ED. No maintaince inhaler  -On home Albuterol. Ran out yesterday. - Not sick recently or sick contact  -Some issue with urination. Difficult initiating   -HIV meds. Just got refills   Fam: grandma -heart  Social: -Somke 1/2 p/d. Since 51 yo -live w husband -Smoke marijuana excessively  -hallucination, bipolar, schizo -Interimttent going back normal -No plan but if have means will do it. End my life is necessary

## 2021-04-09 ENCOUNTER — Telehealth: Payer: Self-pay

## 2021-04-09 ENCOUNTER — Other Ambulatory Visit: Payer: Self-pay

## 2021-04-09 ENCOUNTER — Encounter (HOSPITAL_COMMUNITY): Payer: Self-pay | Admitting: Internal Medicine

## 2021-04-09 DIAGNOSIS — R45851 Suicidal ideations: Secondary | ICD-10-CM

## 2021-04-09 DIAGNOSIS — J45901 Unspecified asthma with (acute) exacerbation: Secondary | ICD-10-CM

## 2021-04-09 LAB — BASIC METABOLIC PANEL
Anion gap: 11 (ref 5–15)
BUN: 23 mg/dL — ABNORMAL HIGH (ref 6–20)
CO2: 22 mmol/L (ref 22–32)
Calcium: 9.3 mg/dL (ref 8.9–10.3)
Chloride: 105 mmol/L (ref 98–111)
Creatinine, Ser: 1.07 mg/dL (ref 0.61–1.24)
GFR, Estimated: >60 mL/min (ref >60)
Glucose, Bld: 175 mg/dL — ABNORMAL HIGH (ref 70–99)
Potassium: 4.8 mmol/L (ref 3.5–5.1)
Sodium: 138 mmol/L (ref 135–145)

## 2021-04-09 LAB — CBC
HCT: 41 % (ref 39.0–52.0)
Hemoglobin: 14 g/dL (ref 13.0–17.0)
MCH: 31.8 pg (ref 26.0–34.0)
MCHC: 34.1 g/dL (ref 30.0–36.0)
MCV: 93.2 fL (ref 80.0–100.0)
Platelets: 397 10*3/uL (ref 150–400)
RBC: 4.4 MIL/uL (ref 4.22–5.81)
RDW: 11.7 % (ref 11.5–15.5)
WBC: 5.3 10*3/uL (ref 4.0–10.5)
nRBC: 0 % (ref 0.0–0.2)

## 2021-04-09 MED ORDER — BUDESONIDE-FORMOTEROL FUMARATE 80-4.5 MCG/ACT IN AERO
2.0000 | INHALATION_SPRAY | RESPIRATORY_TRACT | 12 refills | Status: DC
  Filled 2021-04-09: qty 10.2, 30d supply, fill #0

## 2021-04-09 MED ORDER — PREDNISONE 20 MG PO TABS
40.0000 mg | ORAL_TABLET | Freq: Every day | ORAL | 0 refills | Status: AC
  Filled 2021-04-09: qty 6, 3d supply, fill #0

## 2021-04-09 MED ORDER — ALBUTEROL SULFATE HFA 108 (90 BASE) MCG/ACT IN AERS
2.0000 | INHALATION_SPRAY | Freq: Four times a day (QID) | RESPIRATORY_TRACT | 2 refills | Status: DC | PRN
  Filled 2021-04-09: qty 18, 25d supply, fill #0

## 2021-04-09 MED ORDER — ALBUTEROL SULFATE (2.5 MG/3ML) 0.083% IN NEBU: 2.5000 mg | INHALATION_SOLUTION | Freq: Four times a day (QID) | RESPIRATORY_TRACT | Status: DC | PRN

## 2021-04-09 MED ORDER — ALBUTEROL SULFATE HFA 108 (90 BASE) MCG/ACT IN AERS
1.0000 | INHALATION_SPRAY | Freq: Four times a day (QID) | RESPIRATORY_TRACT | 2 refills | Status: DC | PRN
  Filled 2021-04-09: qty 6.7, fill #0

## 2021-04-09 MED ORDER — MOMETASONE FURO-FORMOTEROL FUM 100-5 MCG/ACT IN AERO
2.0000 | INHALATION_SPRAY | Freq: Two times a day (BID) | RESPIRATORY_TRACT | Status: DC
  Administered 2021-04-09: 2 via RESPIRATORY_TRACT
  Filled 2021-04-09: qty 8.8

## 2021-04-09 MED ORDER — TRAZODONE HCL 50 MG PO TABS: 100.0000 mg | ORAL_TABLET | Freq: Every evening | ORAL | Status: DC | PRN

## 2021-04-09 NOTE — ED Notes (Signed)
Sandwich given per patient request. Tolerated well. Calm and cooperative. No signs of distress.

## 2021-04-09 NOTE — Telephone Encounter (Signed)
Hospital TOC per Dr. Marianna Payment, discharge 04/09/2021, appt 04/16/2021.

## 2021-04-09 NOTE — Discharge Summary (Shared)
° °  Name: John [name] MRN: [id number] DOB: 10-08-1970 51 y.o. PCP: Pcp, No  Date of Admission: 04/08/2021  9:46 AM Date of Discharge: No discharge date for patient encounter. Attending Physician: Charise Killian, MD  Discharge Diagnosis: 1. ***  Discharge Medications: Allergies as of 04/09/2021       Reactions   Shellfish Allergy Anaphylaxis, Swelling     Med Rec must be completed prior to using this [facility]***       Disposition and follow-up:   Mr.Amour Lye was discharged from [facility] in {DISCHARGE CONDITION:19696} condition.  At the hospital follow up visit please address:  1.  ***  2.  Labs / imaging needed at time of follow-up: ***  3.  Pending labs/ test needing follow-up: ***  Follow-up Appointments:   Hospital Course by problem list: 1.   #Asthma exacerbation The patient presented here with worsening wheezing, cough, and shortness of breath in the setting of running out of his home albuterol and steroid regimens recently.  Denies recent illness.  On arrival, he was satting well on room air and was afebrile and hemodynamically stable.  Exam is notable for scattered end expiratory wheezing.  In the ED, patient was given nebulized albuterol, Solu-Medrol, and IV magnesium.  Chest x-ray was unremarkable.  On admit, we started duo nebs as needed as well as daily prednisone and continue to monitor his respiratory status.  On 1/26, ***  #Suicidal ideation #Bipolar disorder #Major depression with psychotic features #GAD The patient endorses prior suicide attempt as well as current suicidal ideation.  Also states that he sometimes has thoughts of harming others.  He does not have a specific plan but states that he would hurt himself "by any means necessary."  Psychiatry was consulted, and his home medications (quetiapine, mirtazapine, and BuSpar) were continued throughout his hospitalization.  On 1/26 psychiatry***  #HIV  Patient has a history of  HIV diagnosed in February 2012.  He is followed by infectious disease and is currently taking Symtuza.  Last viral loads in November 2020 were undetectable.  CD4 count of 661 at that time.  On admit, we resumed his home Symtuza, and this was continued throughout his hospitalization.  Discharge Exam:   BP (!) 102/55    Pulse 79    Temp 98 F (36.7 C) (Oral)    Resp 16    Ht 5\' 7"  (1.702 m)    Wt 68 kg    SpO2 97%    BMI 23.49 kg/m  Discharge exam: ***  Pertinent Labs, Studies, and Procedures:  ***  Discharge Instructions:   Signed: Orvis Brill, MD 04/09/2021, 10:18 AM   Pager: @[email or web address] @

## 2021-04-09 NOTE — Consult Note (Signed)
[place] [facility] Consult   Reason for Consult:  Suicidal intent, homicidal. States that if he has a method or tool, will proceed.  Referring Physician:  Dr. Saverio Danker Patient Identification: John [name] MRN:  [id number] Principal Diagnosis: <principal problem not specified> Diagnosis:  Active Problems:   Asthma exacerbation   Total Time spent with patient: 15 minutes  Subjective:   John [name] is a 51 y.o. male patient admitted wheezing and shortness of breath.  Psych consult placed due to patient endorsing suicidal ideations and homicidal ideations with a plan to use a weapon.  Patient was seen and assessed by the psychiatric nurse practitioner, case discussed with Dr. Lovette Cliche.  Patient is seen lying in a ED stretcher, resting comfortably.  He does not make any effort to open his eyes although he does acknowledge my entry into the room and participates with his psychiatric evaluation.  Patient endorses history of suicidal ideations, that are heightened when under the influence of substances.  He does admit to recent cocaine use, drug screen not obtained on this admission, however recent admissions on January 15 and December 26 urine drug screen positive for cocaine and THC.  Patient was recently is seen at [facility] behavioral health urgent care, where he was held overnight for medication management.  Patient also was receiving outpatient services from the same facility, last outpatient visit on January 24.  During this visit patient did deny suicidal ideations and homicidal ideation, and at which time he states he just needed medications refilled.  He does confirm that his medications were sent into his outpatient pharmacy and awaiting to be picked up.  At the time of this evaluation he denies any suicidal ideations, homicidal ideations, and or auditory or visual hallucinations.  He does report compliance with his medication, denies any acute adverse reactions and or side effects  from such.  Patient is a 51 year old male who presented to [facility] emergency department with complaints of wheezing and shortness of breath, had a positive screening for a suicidal risk, in which he endorsed suicidal ideations and homicidal ideations at that time.  On today's reassessment patient denies all of the above.  He is calm and cooperative, alert and oriented x4, very pleasant.  His appearance is disheveled, and he is able to maintain fair eye contact.  He appears to be under observation, and currently being held in the emergency department.  His current presentation of suicidal ideations, depressive symptoms are likely secondary to substance-induced mood disorder, which has improved as substances have metabolized.  Patient does appear to be open about outpatient substance abuse treatment. ."  Past Psychiatric History: patient is well know to this services, with a history depression, anxiety , substance use disorder and suicidal ideations.  Patient has previous history of suicide attempt which was aborted, as he was attempted to jump off a bridge and interrupted by police.  He states 1 previous hospitalization in 2001.  He currently receives outpatient psychiatric services at [facility].  Risk to Self: Denies currently Risk to Others: Denies Prior Inpatient Therapy: 2001 Prior Outpatient Therapy:   [facility]   Past Medical History:  Past Medical History:  Diagnosis Date   Alcohol abuse    Alcoholism ([place])    Anxiety    Asthma    Asthma    Bipolar disorder ([place])    Chronic low back pain    Cocaine abuse ([place])    Depression    Gout  Gout    HIV (human immunodeficiency virus infection) ([place])    "dx'd ~ 2 yr ago" (09/29/2012)   HIV (human immunodeficiency virus infection) ([place])    HIV disease ([name])    Homelessness    Hypertension    Marijuana abuse    Mental disorder    Schizophrenia ([place])     Past Surgical  History:  Procedure Laterality Date   SKIN GRAFT FULL THICKNESS LEG Left ?   POSTERIOR LEFT LEG  AFTER DOG BITES   Family History:  Family History  Problem Relation Age of Onset   Alcoholism Mother    Depression Mother    Alcoholism Brother    Family Psychiatric  History:  Social History:  Social History   Substance and Sexual Activity  Alcohol Use Yes   Alcohol/week: 12.0 standard drinks   Types: 12 Cans of beer per week   Comment: Daily. "As much as I can get."     Social History   Substance and Sexual Activity  Drug Use Yes   Types: Marijuana, "Crack" cocaine   Comment: States been drinking 4-40oz beers daily.    Social History   Socioeconomic History   Marital status: Single    Spouse name: Not on file   Number of children: 0   Years of education: Not on file   Highest education level: 9th grade  Occupational History   Occupation: unemployed  Tobacco Use   Smoking status: Every Day    Packs/day: 0.50    Years: 27.00    Pack years: 13.50    Types: Cigarettes   Smokeless tobacco: Never  Vaping Use   Vaping Use: Never used  Substance and Sexual Activity   Alcohol use: Yes    Alcohol/week: 12.0 standard drinks    Types: 12 Cans of beer per week    Comment: Daily. "As much as I can get."   Drug use: Yes    Types: Marijuana, "Crack" cocaine    Comment: States been drinking 4-40oz beers daily.   Sexual activity: Not Currently    Partners: Male    Birth control/protection: Condom    Comment: given condoms 01/2021  Other Topics Concern   Not on file  Social History Narrative   ** Merged History Encounter **       ** Merged History Encounter **       Social Determinants of Health   Financial Resource Strain: High Risk   Difficulty of Paying Living Expenses: Very hard  Food Insecurity: Landscape architect Present   Worried About Charity fundraiser in the Last Year: Sometimes true   Arboriculturist in the Last Year: Sometimes true  Transportation Needs:  Public librarian (Medical): Yes   Lack of Transportation (Non-Medical): Yes  Physical Activity: Inactive   Days of Exercise per Week: 0 days   Minutes of Exercise per Session: 0 min  Stress: Stress Concern Present   Feeling of Stress : Rather much  Social Connections: Moderately Isolated   Frequency of Communication with Friends and Family: Three times a week   Frequency of Social Gatherings with Friends and Family: Three times a week   Attends Religious Services: Never   Active Member of Clubs or Organizations: Yes   Attends Music therapist: More than 4 times per year   Marital Status: Never married   Additional Social History:    Allergies:   Allergies  Allergen Reactions   Shellfish Allergy  Anaphylaxis and Swelling    Labs:  Results for orders placed or performed during the hospital encounter of 04/08/21 (from the past 48 hour(s))  Basic metabolic panel     Status: Abnormal   Collection Time: 04/08/21 10:29 AM  Result Value Ref Range   Sodium 134 (L) 135 - 145 mmol/L   Potassium 3.8 3.5 - 5.1 mmol/L   Chloride 102 98 - 111 mmol/L   CO2 20 (L) 22 - 32 mmol/L   Glucose, Bld 71 70 - 99 mg/dL    Comment: Glucose reference range applies only to samples taken after fasting for at least 8 hours.   BUN 10 6 - 20 mg/dL   Creatinine, Ser 0.77 0.61 - 1.24 mg/dL   Calcium 9.2 8.9 - 10.3 mg/dL   GFR, Estimated >60 >60 mL/min    Comment: (NOTE) Calculated using the CKD-EPI Creatinine Equation (2021)    Anion gap 12 5 - 15    Comment: Performed at [facility] [address], [place], [place] [address]  CBC with Differential/Platelet     Status: None   Collection Time: 04/08/21 10:29 AM  Result Value Ref Range   WBC 4.5 4.0 - 10.5 K/uL   RBC 4.50 4.22 - 5.81 MIL/uL   Hemoglobin 14.0 13.0 - 17.0 g/dL   HCT 42.4 39.0 - 52.0 %   MCV 94.2 80.0 - 100.0 fL   MCH 31.1 26.0 - 34.0 pg   MCHC 33.0 30.0 - 36.0 g/dL   RDW 11.7  11.5 - 15.5 %   Platelets 359 150 - 400 K/uL   nRBC 0.0 0.0 - 0.2 %   Neutrophils Relative % 45 %   Neutro Abs 2.0 1.7 - 7.7 K/uL   Lymphocytes Relative 37 %   Lymphs Abs 1.6 0.7 - 4.0 K/uL   Monocytes Relative 10 %   Monocytes Absolute 0.5 0.1 - 1.0 K/uL   Eosinophils Relative 7 %   Eosinophils Absolute 0.3 0.0 - 0.5 K/uL   Basophils Relative 1 %   Basophils Absolute 0.0 0.0 - 0.1 K/uL   Immature Granulocytes 0 %   Abs Immature Granulocytes 0.01 0.00 - 0.07 K/uL    Comment: Performed at [facility], [address], [place], [place] [address]  Brain natriuretic peptide     Status: None   Collection Time: 04/08/21 10:29 AM  Result Value Ref Range   B Natriuretic Peptide 76.2 0.0 - 100.0 pg/mL    Comment: Performed at [facility] [address], [place], [place] [address]  Troponin I (High Sensitivity)     Status: None   Collection Time: 04/08/21 10:29 AM  Result Value Ref Range   Troponin I (High Sensitivity) 3 <18 ng/L    Comment: (NOTE) Elevated high sensitivity troponin I (hsTnI) values and significant  changes across serial measurements may suggest ACS but many other  chronic and acute conditions are known to elevate hsTnI results.  Refer to the "Links" section for chest pain algorithms and additional  guidance. Performed at [facility], [place] [address] [place], [place] [address]   Resp Panel by RT-PCR (Flu A&B, Covid) Nasopharyngeal Swab     Status: None   Collection Time: 04/08/21 11:01 AM   Specimen: Nasopharyngeal Swab; Nasopharyngeal(NP) swabs in vial transport medium  Result Value Ref Range   SARS Coronavirus 2 by RT PCR NEGATIVE NEGATIVE    Comment: (NOTE) SARS-CoV-2 target nucleic acids are NOT DETECTED.  The SARS-CoV-2  RNA is generally detectable in upper respiratory specimens during the acute phase of infection. The lowest concentration of SARS-CoV-2 viral copies this assay can detect is 138 copies/mL. A negative result does not preclude  SARS-Cov-2 infection and should not be used as the sole basis for treatment or other patient management decisions. A negative result may occur with  improper specimen collection/handling, submission of specimen other than nasopharyngeal swab, presence of viral mutation(s) within the areas targeted by this assay, and inadequate number of viral copies(<138 copies/mL). A negative result must be combined with clinical observations, patient history, and epidemiological information. The expected result is Negative.  Fact Sheet for Patients:  EntrepreneurPulse.com.au  Fact Sheet for Healthcare Providers:  IncredibleEmployment.be  This test is no t yet approved or cleared by the [place] FDA and  has been authorized for detection and/or diagnosis of SARS-CoV-2 by FDA under an Emergency Use Authorization (EUA). This EUA will remain  in effect (meaning this test can be used) for the duration of the COVID-19 declaration under Section 564(b)(1) of the Act, 21 U.S.C.section 360bbb-3(b)(1), unless the authorization is terminated  or revoked sooner.       Influenza A by PCR NEGATIVE NEGATIVE   Influenza B by PCR NEGATIVE NEGATIVE    Comment: (NOTE) The Xpert Xpress SARS-CoV-2/FLU/RSV plus assay is intended as an aid in the diagnosis of influenza from Nasopharyngeal swab specimens and should not be used as a sole basis for treatment. Nasal washings and aspirates are unacceptable for Xpert Xpress SARS-CoV-2/FLU/RSV testing.  Fact Sheet for Patients: EntrepreneurPulse.com.au  Fact Sheet for Healthcare Providers: IncredibleEmployment.be  This test is not yet approved or cleared by the [place] FDA and has been authorized for detection and/or diagnosis of SARS-CoV-2 by FDA under an Emergency Use Authorization (EUA). This EUA will remain in effect (meaning this test can be used) for the duration of the COVID-19  declaration under Section 564(b)(1) of the Act, 21 U.S.C. section 360bbb-3(b)(1), unless the authorization is terminated or revoked.  Performed at [facility], [place] [address], [place], [place] [address]   Troponin I (High Sensitivity)     Status: None   Collection Time: 04/08/21  1:23 PM  Result Value Ref Range   Troponin I (High Sensitivity) 3 <18 ng/L    Comment: (NOTE) Elevated high sensitivity troponin I (hsTnI) values and significant  changes across serial measurements may suggest ACS but many other  chronic and acute conditions are known to elevate hsTnI results.  Refer to the "Links" section for chest pain algorithms and additional  guidance. Performed at [name], [place] [address] [place], [place] [address]   I-Stat venous blood gas, [facility] ED)     Status: Abnormal   Collection Time: 04/08/21  1:54 PM  Result Value Ref Range   pH, Ven 7.392 7.250 - 7.430   pCO2, Ven 36.4 (L) 44.0 - 60.0 mmHg   pO2, Ven 45.0 32.0 - 45.0 mmHg   Bicarbonate 22.1 20.0 - 28.0 mmol/L   TCO2 23 22 - 32 mmol/L   O2 Saturation 81.0 %   Acid-base deficit 2.0 0.0 - 2.0 mmol/L   Sodium 137 135 - 145 mmol/L   Potassium 4.1 3.5 - 5.1 mmol/L   Calcium, Ion 1.14 (L) 1.15 - 1.40 mmol/L   HCT 42.0 39.0 - 52.0 %   Hemoglobin 14.3 13.0 - 17.0 g/dL   Sample type VENOUS   Basic metabolic panel     Status: Abnormal   Collection  Time: 04/09/21 12:31 AM  Result Value Ref Range   Sodium 138 135 - 145 mmol/L   Potassium 4.8 3.5 - 5.1 mmol/L   Chloride 105 98 - 111 mmol/L   CO2 22 22 - 32 mmol/L   Glucose, Bld 175 (H) 70 - 99 mg/dL    Comment: Glucose reference range applies only to samples taken after fasting for at least 8 hours.   BUN 23 (H) 6 - 20 mg/dL   Creatinine, Ser 1.07 0.61 - 1.24 mg/dL   Calcium 9.3 8.9 - 10.3 mg/dL   GFR, Estimated >60 >60 mL/min    Comment: (NOTE) Calculated using the CKD-EPI Creatinine Equation (2021)    Anion gap 11 5 - 15    Comment: Performed at [facility] [address], [place] [address]  CBC     Status: None   Collection Time: 04/09/21 12:31 AM  Result Value Ref Range   WBC 5.3 4.0 - 10.5 K/uL   RBC 4.40 4.22 - 5.81 MIL/uL   Hemoglobin 14.0 13.0 - 17.0 g/dL   HCT 41.0 39.0 - 52.0 %   MCV 93.2 80.0 - 100.0 fL   MCH 31.8 26.0 - 34.0 pg   MCHC 34.1 30.0 - 36.0 g/dL   RDW 11.7 11.5 - 15.5 %   Platelets 397 150 - 400 K/uL   nRBC 0.0 0.0 - 0.2 %    Comment: Performed at [facility], [place] [address] [place], [place] [address]    Current Facility-Administered Medications  Medication Dose Route Frequency Provider Last Rate Last Admin   acetaminophen (TYLENOL) tablet 650 mg  650 mg Oral Q6H PRN Gaylan Gerold, DO       Or   acetaminophen (TYLENOL) suppository 650 mg  650 mg Rectal Q6H PRN Gaylan Gerold, DO       albuterol (PROVENTIL) (2.5 MG/3ML) 0.083% nebulizer solution 2.5 mg  2.5 mg Inhalation Q6H PRN Charise Killian, MD       busPIRone (BUSPAR) tablet 7.5 mg  7.5 mg Oral BID Gaylan Gerold, DO   7.5 mg at 04/09/21 1017   Darunavir-Cobicistat-Emtricitabine-Tenofovir Alafenamide (SYMTUZA) 800-150-200-10 MG TABS 1 tablet  1 tablet Oral q morning Gaylan Gerold, DO   1 tablet at 04/09/21 1017   enoxaparin (LOVENOX) injection 40 mg  40 mg Subcutaneous Q24H Gaylan Gerold, DO       mirtazapine (REMERON) tablet 15 mg  15 mg Oral QHS Gaylan Gerold, DO   15 mg at 04/08/21 2244   mometasone-formoterol (DULERA) 100-5 MCG/ACT inhaler 2 puff  2 puff Inhalation BID Charise Killian, MD   2 puff at 04/09/21 0930   predniSONE (DELTASONE) tablet 40 mg  40 mg Oral Q breakfast Gaylan Gerold, DO   40 mg at 04/09/21 1103   QUEtiapine (SEROQUEL) tablet 100 mg  100 mg Oral QHS Gaylan Gerold, DO   100 mg at 04/08/21 2244   Current Outpatient Medications  Medication Sig Dispense Refill   albuterol (VENTOLIN HFA) 108 (90 Base) MCG/ACT inhaler Inhale 1-2 puffs into the lungs every 6 (six) hours as needed for wheezing or shortness of breath. 6.7 g 2    SYMTUZA 800-150-200-10 MG TABS Take 1 tablet by mouth every morning. 30 tablet 5   busPIRone (BUSPAR) 7.5 MG tablet Take 1 tablet (7.5 mg total) by mouth 2 (two) times daily. 28 tablet 0   mirtazapine (REMERON) 15 MG tablet Take 1 tablet (15 mg total) by mouth at bedtime. 14 tablet 0  QUEtiapine (SEROQUEL) 100 MG tablet Take 1 tablet (100 mg total) by mouth at bedtime. 14 tablet 0    Musculoskeletal: Strength & Muscle Tone: within normal limits Gait & Station: normal Patient leans: N/A  Psychiatric Specialty Exam:  Presentation  General Appearance: Appropriate for Environment; Casual  Eye Contact:Good  Speech:Clear and Coherent; Normal Rate  Speech Volume:Normal  Handedness:Right   Mood and Affect  Mood:Euthymic  Affect:Congruent; Appropriate   Thought Process  Thought Processes:Coherent; Linear; Goal Directed  Descriptions of Associations:Intact  Orientation:Full (Time, Place and Person)  Thought Content:Logical  History of Schizophrenia/Schizoaffective disorder:Yes  Duration of Psychotic Symptoms:Greater than six months  Hallucinations:Hallucinations: None  Ideas of Reference:None  Suicidal Thoughts:Suicidal Thoughts: No  Homicidal Thoughts:Homicidal Thoughts: No   Sensorium  Memory:Immediate Fair; Recent Fair; Remote Fair  Judgment:Fair  Insight:Fair   Executive Functions  Concentration:Fair  Attention Span:Fair  [place]   Psychomotor Activity  Psychomotor Activity:Psychomotor Activity: Normal   Assets  Assets:Communication Skills; Desire for Improvement; Financial Resources/Insurance; Physical Health; Leisure Time   Sleep  Sleep:Sleep: Fair   Physical Exam: Physical Exam Vitals and nursing note reviewed.  Constitutional:      Appearance: He is well-developed and normal weight.  HENT:     Head: Normocephalic.  Cardiovascular:     Rate and Rhythm: Normal rate.  Skin:     General: Skin is warm and dry.  Neurological:     General: No focal deficit present.     Mental Status: He is alert and oriented to person, place, and time.  Psychiatric:        Attention and Perception: Attention normal.        Mood and Affect: Mood normal.        Speech: Speech normal.        Behavior: Behavior normal. Behavior is cooperative.        Thought Content: Thought content normal.        Cognition and Memory: Cognition and memory normal.        Judgment: Judgment is not impulsive.   Review of Systems  Eyes: Negative.   Cardiovascular: Negative.   Musculoskeletal: Negative.   Psychiatric/Behavioral:  Positive for depression. Negative for hallucinations, memory loss, substance abuse and suicidal ideas. The patient is not nervous/anxious.   All other systems reviewed and are negative. Blood pressure 112/70, pulse 82, temperature 98 F (36.7 C), temperature source Oral, resp. rate 20, height 5\' 7"  (1.702 m), weight 68 kg, SpO2 98 %. Body mass index is 23.49 kg/m.  Treatment Plan Summary: Plan continue current medications.  Patient would like to have trazodone restarted, will place new order for this at this time. - A place for trazodone 100 mg p.o. nightly as needed. -Continue with current outpatient psychiatric resources at [facility].  -Consider [facility] consult for substance abuse. Consider halfway house.  Disposition: No evidence of imminent risk to self or others at present.   Patient does not meet criteria for psychiatric inpatient admission. Supportive therapy provided about ongoing stressors. Refer to IOP. Discussed crisis plan, support from social network, calling 911, coming to the Emergency Department, and calling Suicide Hotline.  Suella Broad, FNP 04/09/2021 1:38 PM

## 2021-04-09 NOTE — ED Notes (Signed)
Ambulatory to bathroom, gait steady. No complaints.

## 2021-04-09 NOTE — Discharge Summary (Signed)
Name: John [name] MRN: [id number] DOB: 1970-08-16 51 y.o. PCP: Pcp, No  Date of Admission: 04/08/2021  9:46 AM Date of Discharge:  04/09/2021 Attending Physician: Dr.  Saverio Danker  DISCHARGE DIAGNOSIS:  Primary Problem: <principal problem not specified>   Hospital Problems: Active Problems:   Asthma exacerbation    DISCHARGE MEDICATIONS:   Allergies as of 04/09/2021       Reactions   Shellfish Allergy Anaphylaxis, Swelling        Medication List     TAKE these medications    albuterol 108 (90 Base) MCG/ACT inhaler Commonly known as: VENTOLIN HFA Inhale 1-2 puffs into the lungs every 6 (six) hours as needed for wheezing or shortness of breath.   budesonide-formoterol 80-4.5 MCG/ACT inhaler Commonly known as: Symbicort Inhale 2 puffs into the lungs daily.   busPIRone 7.5 MG tablet Commonly known as: BUSPAR Take 1 tablet (7.5 mg total) by mouth 2 (two) times daily.   mirtazapine 15 MG tablet Commonly known as: REMERON Take 1 tablet (15 mg total) by mouth at bedtime.   predniSONE 20 MG tablet Commonly known as: DELTASONE Take 2 tablets (40 mg total) by mouth daily with breakfast for 3 days. Start taking on: April 10, 2021   QUEtiapine 100 MG tablet Commonly known as: SEROQUEL Take 1 tablet (100 mg total) by mouth at bedtime.   Symtuza 800-150-200-10 MG Tabs Generic drug: Darunavir-Cobicistat-Emtricitabine-Tenofovir Alafenamide Take 1 tablet by mouth every morning.        DISPOSITION AND FOLLOW-UP:  John [name] was discharged from [facility] in [place] condition. At the hospital follow up visit please address:  Follow-up Recommendations: Consults: needs to follow up with [facility]: Comprehensive Metabolic Panel Studies: PFTs Medications: make sure patient finished oral steroids, and got his new inhaler (Symbicort)  Follow-up Appointments:  Follow-up Information     Harvie Heck, MD Follow up  on 04/16/2021.   Specialty: Internal Medicine Why: at 2 Contact information: [address] [address] [place]. Schedule an appointment as soon as possible for a visit.   Contact information: [place], [place] [address]  [phone]                HOSPITAL COURSE:  Patient Summary: John [name] is a 50 y.o. male with a PMHx of asthma, anxiety, HIV, depression, HTN, alcoholism, homelessness, cocaine abuse, and bipolar disorder who presents to [facility] with chief complaint of wheezing and shortness of breath.  #Asthma Exacerbation: Patient presented with signs and symptoms concerning for exacerbation including wheezing,, productive cough, and shortness of breath.  Patient states that he ran out of all of his asthma medications.  In the ED patient was given albuterol nebulizers, Solu-Medrol, and IV magnesium with significant improvement of his symptoms.  Patient is now stable off supplemental oxygen and doing significantly better.  Patient will be discharged home with refill of albuterol, Symbicort, and 3 more days of oral prednisone.  Patient was made a follow-up appointment at [facility] in general medicine clinic for early February.  #Suicidal ideation #Bipolar disorder #Major depression with psychotic features #GAD Patient presented with a history of significant behavioral health disorders including her recent suicidal ideation in the setting of polysubstance use disorder.  Psychiatry was consulted on admission and deemed him safe for discharge.  He had recent appointment at [facility].  This is where he gets his psychiatric medications.  Patient was informed to follow-up with his clinic for further evaluation and management of his psychiatric disorders.  Patient should continue Seroquel, mirtazapine, and BuSpar in the outpatient setting.   #HIV  Patient  restarted on his HIV medications.   DISCHARGE INSTRUCTIONS:   Discharge Instructions     Diet - low sodium heart healthy   Complete by: As directed    Discharge instructions   Complete by: As directed    New medications: - Prednisone 40 mg daily for 3 days  - Symbicort 1 puff daily  Follow-up: 1. [place] - April 16, 2021 at 2:15 pm with Dr. Marva Panda  2. [facility] - Please make a follow up appointment.   Increase activity slowly   Complete by: As directed        SUBJECTIVE:  Patient resting in bed. He states that his respiratory symptoms have significantly improved. No evidence of systemic infection.     OBJECTIVE:  Discharge Vitals:   BP 112/70    Pulse 82    Temp 98 F (36.7 C) (Oral)    Resp 20    Ht 5\' 7"  (1.702 m)    Wt 68 kg    SpO2 98%    BMI 23.49 kg/m  Physical Exam Constitutional:      General: He is not in acute distress. HENT:     Head: Normocephalic and atraumatic.  Eyes:     Extraocular Movements: Extraocular movements intact.  Cardiovascular:     Rate and Rhythm: Normal rate.     Pulses: Normal pulses.  Pulmonary:     Effort: Pulmonary effort is normal. No respiratory distress.     Breath sounds: No wheezing.  Abdominal:     General: There is no distension.  Musculoskeletal:        General: Normal range of motion.     Cervical back: Normal range of motion.  Skin:    General: Skin is warm and dry.  Neurological:     General: No focal deficit present.     Mental Status: He is alert and oriented to person, place, and time.  Psychiatric:        Attention and Perception: He perceives visual hallucinations.     Pertinent Labs, Studies, and Procedures:  CBC Latest Ref Rng & Units 04/09/2021 04/08/2021 04/08/2021  WBC 4.0 - 10.5 K/uL 5.3 - 4.5  Hemoglobin 13.0 - 17.0 g/dL 14.0 14.3 14.0  Hematocrit 39.0 - 52.0 % 41.0 42.0 42.4  Platelets 150 - 400 K/uL 397 - 359    CMP Latest Ref Rng &  Units 04/09/2021 04/08/2021 04/08/2021  Glucose 70 - 99 mg/dL 175(H) - 71  BUN 6 - 20 mg/dL 23(H) - 10  Creatinine 0.61 - 1.24 mg/dL 1.07 - 0.77  Sodium 135 - 145 mmol/L 138 137 134(L)  Potassium 3.5 - 5.1 mmol/L 4.8 4.1 3.8  Chloride 98 - 111 mmol/L 105 - 102  CO2 22 - 32 mmol/L 22 - 20(L)  Calcium 8.9 - 10.3 mg/dL 9.3 - 9.2  Total Protein 6.5 - 8.1 g/dL - - -  Total Bilirubin 0.3 - 1.2 mg/dL - - -  Alkaline Phos 38 - 126 U/L - - -  AST 15 - 41 U/L - - -  ALT 0 - 44 U/L - - -    DG Chest Port 1 View  Result Date: 04/08/2021 CLINICAL DATA:  Shortness of breath  EXAM: PORTABLE CHEST 1 VIEW COMPARISON:  Chest radiograph 01/25/2021 FINDINGS: The cardiomediastinal silhouette is within normal limits. There is no focal consolidation or pulmonary edema. There is no pleural effusion or pneumothorax There is no acute osseous abnormality. IMPRESSION: No radiographic evidence of acute cardiopulmonary process. Electronically Signed   By: Valetta Mole M.D.   On: 04/08/2021 10:53     Signed: Lawerance Cruel, D.O.  Internal Medicine Resident, PGY-3 [facility]  Pager: [phone] 3:30 PM, 04/09/2021

## 2021-04-09 NOTE — ED Notes (Signed)
Sandwich given per patient request. Tolerated well. No complaints. No signs of distress. Calm and cooperative.

## 2021-04-09 NOTE — Progress Notes (Signed)
CSW spoke with patient and provided patient with inpatient/outpatient substance abuse resources. CSW also provided patient with shelter and halfway house resources. CSW also let patient know the [facility] opens tonight at 7:00 PM due to the temperature dropping if he needs a place to stay tonight.

## 2021-04-09 NOTE — ED Notes (Signed)
Attempted to collect UA.
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
## 2021-04-09 NOTE — ED Notes (Signed)
Calm, cooperative. Denies suicidal ideations. No complaints.

## 2021-04-09 NOTE — Discharge Instructions (Signed)
[facility] will be open tonight, Thursday April 09, 2021. [place]

## 2021-04-10 ENCOUNTER — Telehealth (HOSPITAL_COMMUNITY): Payer: Self-pay

## 2021-04-10 NOTE — BH Assessment (Signed)
Care Management - [place] Follow Up Discharges   Writer attempted to make contact with patient today and was unsuccessful.  Writer left a HIPPA compliant voice message.   Per chart review, patient has a follow up appt with Ludwig Clarks, NP on 06-30-21 at [facility] on the 2nd floor.

## 2021-04-16 ENCOUNTER — Encounter: Payer: Medicaid Other | Admitting: Internal Medicine

## 2021-04-16 ENCOUNTER — Other Ambulatory Visit: Payer: Self-pay

## 2021-04-17 ENCOUNTER — Encounter (HOSPITAL_COMMUNITY): Payer: Self-pay

## 2021-04-17 ENCOUNTER — Emergency Department (HOSPITAL_COMMUNITY)
Admission: EM | Admit: 2021-04-17 | Discharge: 2021-04-17 | Disposition: A | Discharge status: Home / Self Care | Payer: Medicaid Other | Attending: Emergency Medicine | Admitting: Emergency Medicine

## 2021-04-17 ENCOUNTER — Other Ambulatory Visit: Payer: Self-pay

## 2021-04-17 DIAGNOSIS — S41051A Open bite of right shoulder, initial encounter: Secondary | ICD-10-CM | POA: Diagnosis present

## 2021-04-17 DIAGNOSIS — W503XXA Accidental bite by another person, initial encounter: Secondary | ICD-10-CM
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
## 2021-04-17 NOTE — ED Notes (Signed)
Patient given discharge instructions. Questions were answered. Patient verbalized understanding of discharge instructions and care at home.  
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
## 2021-04-21 ENCOUNTER — Other Ambulatory Visit: Payer: Self-pay

## 2021-04-21 ENCOUNTER — Encounter (HOSPITAL_COMMUNITY): Payer: Self-pay

## 2021-04-21 ENCOUNTER — Emergency Department (HOSPITAL_COMMUNITY): Payer: Medicaid Other

## 2021-04-21 ENCOUNTER — Emergency Department (HOSPITAL_COMMUNITY)
Admission: EM | Admit: 2021-04-21 | Discharge: 2021-04-21 | Disposition: A | Discharge status: Home / Self Care | Payer: Medicaid Other | Attending: Emergency Medicine | Admitting: Emergency Medicine

## 2021-04-21 DIAGNOSIS — J4531 Mild persistent asthma with (acute) exacerbation: Secondary | ICD-10-CM

## 2021-04-21 DIAGNOSIS — Z79899 Other long term (current) drug therapy: Secondary | ICD-10-CM | POA: Diagnosis not present

## 2021-04-21 DIAGNOSIS — F1410 Cocaine abuse, uncomplicated: Secondary | ICD-10-CM

## 2021-04-21 DIAGNOSIS — R0602 Shortness of breath: Secondary | ICD-10-CM | POA: Insufficient documentation

## 2021-04-21 DIAGNOSIS — Z7951 Long term (current) use of inhaled steroids: Secondary | ICD-10-CM | POA: Insufficient documentation

## 2021-04-21 DIAGNOSIS — R062 Wheezing: Secondary | ICD-10-CM

## 2021-04-21 DIAGNOSIS — J45909 Unspecified asthma, uncomplicated: Secondary | ICD-10-CM | POA: Diagnosis not present

## 2021-04-21 LAB — BASIC METABOLIC PANEL
Anion gap: 6 (ref 5–15)
BUN: 11 mg/dL (ref 6–20)
CO2: 26 mmol/L (ref 22–32)
Calcium: 8.6 mg/dL — ABNORMAL LOW (ref 8.9–10.3)
Chloride: 108 mmol/L (ref 98–111)
Creatinine, Ser: 0.7 mg/dL (ref 0.61–1.24)
GFR, Estimated: >60 mL/min (ref >60)
Glucose, Bld: 87 mg/dL (ref 70–99)
Potassium: 4.3 mmol/L (ref 3.5–5.1)
Sodium: 140 mmol/L (ref 135–145)

## 2021-04-21 LAB — CBC
HCT: 39.6 % (ref 39.0–52.0)
Hemoglobin: 12.9 g/dL — ABNORMAL LOW (ref 13.0–17.0)
MCH: 31.3 pg (ref 26.0–34.0)
MCHC: 32.6 g/dL (ref 30.0–36.0)
MCV: 96.1 fL (ref 80.0–100.0)
Platelets: 310 10*3/uL (ref 150–400)
RBC: 4.12 MIL/uL — ABNORMAL LOW (ref 4.22–5.81)
RDW: 12.1 % (ref 11.5–15.5)
WBC: 4.3 10*3/uL (ref 4.0–10.5)
nRBC: 0 % (ref 0.0–0.2)

## 2021-04-21 LAB — RAPID URINE DRUG SCREEN, HOSP PERFORMED
Amphetamines: NOT DETECTED
Barbiturates: NOT DETECTED
Benzodiazepines: NOT DETECTED
Cocaine: POSITIVE — AB
Opiates: NOT DETECTED
Tetrahydrocannabinol: POSITIVE — AB

## 2021-04-21 MED ORDER — ALBUTEROL SULFATE (2.5 MG/3ML) 0.083% IN NEBU
5.0000 mg | INHALATION_SOLUTION | Freq: Once | RESPIRATORY_TRACT | Status: AC
  Administered 2021-04-21: 5 mg via RESPIRATORY_TRACT
  Filled 2021-04-21: qty 6

## 2021-04-21 MED ORDER — ALBUTEROL SULFATE HFA 108 (90 BASE) MCG/ACT IN AERS: 2.0000 | INHALATION_SPRAY | RESPIRATORY_TRACT | 1 refills | Status: DC | PRN

## 2021-04-21 MED ORDER — PREDNISONE 20 MG PO TABS: 60.0000 mg | ORAL_TABLET | Freq: Every day | ORAL | 0 refills | Status: DC

## 2021-04-21 MED ORDER — PREDNISONE 20 MG PO TABS
60.0000 mg | ORAL_TABLET | Freq: Once | ORAL | Status: AC
  Administered 2021-04-21: 60 mg via ORAL
  Filled 2021-04-21: qty 3

## 2021-04-21 MED ORDER — IPRATROPIUM BROMIDE 0.02 % IN SOLN
0.5000 mg | Freq: Once | RESPIRATORY_TRACT | Status: AC
  Administered 2021-04-21: 0.5 mg via RESPIRATORY_TRACT
  Filled 2021-04-21: qty 2.5

## 2021-04-21 NOTE — ED Provider Notes (Signed)
[facility] DEPT Provider Note   CSN: [id number] Arrival date & time: 04/21/21  6767     History  Chief Complaint  Patient presents with   Wheezing    John [name] is a 51 y.o. male.  Patient w hx asthma, c/o increased wheezing/sob in past 1-2 days. Symptoms acute onset, moderate, persistent. Pt denies current steroid use. States ran out of inhaler yesterday. +smoker. Denies chest pain or discomfort. No sore throat. No fever or chills. Occasional non prod cough. No abd pain or nvd. No known covid or flu exposure. No leg pain or swelling. No orthopnea or pnd.   The history is provided by the patient and medical records.      Home Medications Prior to Admission medications   Medication Sig Start Date End Date Taking? Authorizing Provider  albuterol (VENTOLIN HFA) 108 (90 Base) MCG/ACT inhaler Inhale 1-2 puffs into the lungs every 6 (six) hours as needed for wheezing or shortness of breath. 04/09/21   Marianna Payment, MD  albuterol (VENTOLIN HFA) 108 (90 Base) MCG/ACT inhaler Inhale 2 puffs into the lungs every 6 (six) hours as needed for wheezing or shortness of breath. 04/09/21   Marianna Payment, MD  budesonide-formoterol (SYMBICORT) 80-4.5 MCG/ACT inhaler Inhale 2 puffs into the lungs daily. 04/09/21   Marianna Payment, MD  busPIRone (BUSPAR) 7.5 MG tablet Take 1 tablet (7.5 mg total) by mouth 2 (two) times daily. 04/08/21 04/08/22  Derrill Center, NP  mirtazapine (REMERON) 15 MG tablet Take 1 tablet (15 mg total) by mouth at bedtime. 04/08/21 05/08/21  Derrill Center, NP  QUEtiapine (SEROQUEL) 100 MG tablet Take 1 tablet (100 mg total) by mouth at bedtime. 04/08/21 05/08/21  Derrill Center, NP  SYMTUZA 800-150-200-10 MG TABS Take 1 tablet by mouth every morning. 02/10/21   Gold[place] Circle, FNP  dicyclomine (BENTYL) 20 MG tablet Take 1 tablet (20 mg total) by mouth 2 (two) times daily. 01/08/17 01/03/20  Palumbo, April, MD  sucralfate (CARAFATE) 1 GM/10ML  suspension Take 10 mLs (1 g total) by mouth 4 (four) times daily -  with meals and at bedtime. Patient not taking: Reported on 10/21/2017 01/08/17 09/03/18  Palumbo, April, MD      Allergies    Shellfish allergy    Review of Systems   Review of Systems  Constitutional:  Negative for chills and fever.  HENT:  Negative for sore throat.   Eyes:  Negative for redness.  Respiratory:  Positive for cough, shortness of breath and wheezing.   Cardiovascular:  Negative for chest pain, palpitations and leg swelling.  Gastrointestinal:  Negative for abdominal pain and vomiting.  Genitourinary:  Negative for flank pain.  Musculoskeletal:  Negative for back pain and neck pain.  Skin:  Negative for rash.  Neurological:  Negative for headaches.  Hematological:  Does not bruise/bleed easily.  Psychiatric/Behavioral:  Negative for confusion.    Physical Exam Updated Vital Signs BP 111/73 (BP Location: Right Arm)    Pulse 88    Temp 98.5 F (36.9 C) (Oral)    Resp (!) 24    SpO2 100%  Physical Exam Vitals and nursing note reviewed.  Constitutional:      Appearance: Normal appearance. He is well-developed.  HENT:     Head: Atraumatic.     Nose: Nose normal.     Mouth/Throat:     Mouth: Mucous membranes are moist.     Pharynx: Oropharynx is clear.  Eyes:  General: No scleral icterus.    Conjunctiva/sclera: Conjunctivae normal.  Neck:     Trachea: No tracheal deviation.  Cardiovascular:     Rate and Rhythm: Normal rate and regular rhythm.     Pulses: Normal pulses.     Heart sounds: Normal heart sounds. No murmur heard.   No friction rub. No gallop.  Pulmonary:     Effort: Pulmonary effort is normal. No accessory muscle usage or respiratory distress.     Breath sounds: Wheezing present.  Abdominal:     General: Bowel sounds are normal. There is no distension.     Palpations: Abdomen is soft.     Tenderness: There is no abdominal tenderness. There is no guarding.  Genitourinary:     Comments: No cva tenderness. Musculoskeletal:        General: No swelling or tenderness.     Cervical back: Normal range of motion and neck supple. No rigidity.     Right lower leg: No edema.     Left lower leg: No edema.  Skin:    General: Skin is warm and dry.     Findings: No rash.  Neurological:     Mental Status: He is alert.     Comments: Alert, speech clear.   Psychiatric:        Mood and Affect: Mood normal.    ED Results / Procedures / Treatments   Labs (all labs ordered are listed, but only abnormal results are displayed) Results for orders placed or performed during the hospital encounter of 04/21/21  CBC  Result Value Ref Range   WBC 4.3 4.0 - 10.5 K/uL   RBC 4.12 (L) 4.22 - 5.81 MIL/uL   Hemoglobin 12.9 (L) 13.0 - 17.0 g/dL   HCT 39.6 39.0 - 52.0 %   MCV 96.1 80.0 - 100.0 fL   MCH 31.3 26.0 - 34.0 pg   MCHC 32.6 30.0 - 36.0 g/dL   RDW 12.1 11.5 - 15.5 %   Platelets 310 150 - 400 K/uL   nRBC 0.0 0.0 - 0.2 %  Basic metabolic panel  Result Value Ref Range   Sodium 140 135 - 145 mmol/L   Potassium 4.3 3.5 - 5.1 mmol/L   Chloride 108 98 - 111 mmol/L   CO2 26 22 - 32 mmol/L   Glucose, Bld 87 70 - 99 mg/dL   BUN 11 6 - 20 mg/dL   Creatinine, Ser 0.70 0.61 - 1.24 mg/dL   Calcium 8.6 (L) 8.9 - 10.3 mg/dL   GFR, Estimated >60 >60 mL/min   Anion gap 6 5 - 15  Rapid urine drug screen (hospital performed)  Result Value Ref Range   Opiates NONE DETECTED NONE DETECTED   Cocaine POSITIVE (A) NONE DETECTED   Benzodiazepines NONE DETECTED NONE DETECTED   Amphetamines NONE DETECTED NONE DETECTED   Tetrahydrocannabinol POSITIVE (A) NONE DETECTED   Barbiturates NONE DETECTED NONE DETECTED   DG Chest Port 1 View  Result Date: 04/08/2021 CLINICAL DATA:  Shortness of breath EXAM: PORTABLE CHEST 1 VIEW COMPARISON:  Chest radiograph 01/25/2021 FINDINGS: The cardiomediastinal silhouette is within normal limits. There is no focal consolidation or pulmonary edema. There is no  pleural effusion or pneumothorax There is no acute osseous abnormality. IMPRESSION: No radiographic evidence of acute cardiopulmonary process. Electronically Signed   By: Valetta Mole M.D.   On: 04/08/2021 10:53    EKG EKG Interpretation  Date/Time:  Tuesday April 21 2021 12:38:06 EST Ventricular Rate:  68 PR Interval:  131 QRS Duration: 91 QT Interval:  380 QTC Calculation: 405 R Axis:   -9 Text Interpretation: Sinus rhythm Baseline wander No significant change since last tracing Confirmed by Lajean Saver [phone]) on 04/21/2021 12:50:45 PM  Radiology DG Chest Port 1 View  Result Date: 04/21/2021 CLINICAL DATA:  Shortness of breath EXAM: PORTABLE CHEST 1 VIEW COMPARISON:  Previous studies including the examination of 04/08/2021 FINDINGS: Cardiac size is within normal limits. There are no signs of pulmonary edema or focal pulmonary consolidation. Low position of diaphragms suggests COPD. There is no pleural effusion or pneumothorax. IMPRESSION: There are no new infiltrates or signs of pulmonary edema. Electronically Signed   By: Elmer Picker M.D.   On: 04/21/2021 11:42    Procedures Procedures    Medications Ordered in ED Medications  albuterol (PROVENTIL) (2.5 MG/3ML) 0.083% nebulizer solution 5 mg (has no administration in time range)  ipratropium (ATROVENT) nebulizer solution 0.5 mg (has no administration in time range)    ED Course/ Medical Decision Making/ A&P                           Medical Decision Making Problems Addressed: Cocaine use disorder [facility]): acute illness or injury that poses a threat to life or bodily functions Mild persistent asthma with exacerbation: chronic illness or injury with exacerbation, progression, or side effects of treatment that poses a threat to life or bodily functions Wheezing: acute illness or injury with systemic symptoms  Amount and/or Complexity of Data Reviewed Labs: ordered. Decision-making details documented in ED  Course. Radiology: ordered and independent interpretation performed. Decision-making details documented in ED Course. ECG/medicine tests: ordered and independent interpretation performed. Decision-making details documented in ED Course.  Risk Prescription drug management. Diagnosis or treatment significantly limited by social determinants of health.   Continuous pulse ox and cardiac monitoring. Ecg. Pcxr. Labs.   Reviewed nursing notes and prior charts for additional history. External reports reviewed.   Labs reviewed/interpreted by me - wbc normal.   CXR reviewed/interpreted by me - no pna.   Albuterol and atrovent tx. Prednisone po.   Recheck pt, no wheezing. No increased wob. No chest pain or sob. Pulse ox 99% room air.         Final Clinical Impression(s) / ED Diagnoses Final diagnoses:  None    Rx / DC Orders ED Discharge Orders     None         Lajean Saver, MD 04/21/21 [phone]

## 2021-04-21 NOTE — ED Triage Notes (Signed)
Patient here with c/o asthma exacerbation. Patient report he is out of his inhaler yesterday because he been using it so much.

## 2021-04-21 NOTE — Discharge Instructions (Addendum)
It was our pleasure to provide your ER care today - we hope that you feel better.  Take prednisone as prescribed. Use albuterol inhaler as need. Avoid any smoking. Also avoid any cocaine use - cocaine use can cause heart attacks, strokes, and is harmful to your health and mental well-being.   Follow up with primary care doctor in the coming week.  Return to ER if worse, new symptoms, fevers, chest pain, increased trouble breathing, or other concern.

## 2021-04-27 ENCOUNTER — Emergency Department (HOSPITAL_COMMUNITY): Payer: Medicaid Other

## 2021-04-27 ENCOUNTER — Encounter (HOSPITAL_COMMUNITY): Payer: Self-pay

## 2021-04-27 ENCOUNTER — Inpatient Hospital Stay (HOSPITAL_COMMUNITY)
Admission: EM | Admit: 2021-04-27 | Discharge: 2021-04-30 | DRG: 202 | Disposition: A | Discharge status: Home / Self Care | Payer: Medicaid Other | Attending: Internal Medicine | Admitting: Internal Medicine

## 2021-04-27 DIAGNOSIS — F4310 Post-traumatic stress disorder, unspecified: Secondary | ICD-10-CM

## 2021-04-27 DIAGNOSIS — J4541 Moderate persistent asthma with (acute) exacerbation: Principal | ICD-10-CM

## 2021-04-27 DIAGNOSIS — J101 Influenza due to other identified influenza virus with other respiratory manifestations: Secondary | ICD-10-CM

## 2021-04-27 DIAGNOSIS — Z21 Asymptomatic human immunodeficiency virus [HIV] infection status: Secondary | ICD-10-CM

## 2021-04-27 DIAGNOSIS — E871 Hypo-osmolality and hyponatremia: Secondary | ICD-10-CM | POA: Diagnosis present

## 2021-04-27 DIAGNOSIS — Z20822 Contact with and (suspected) exposure to covid-19: Secondary | ICD-10-CM | POA: Diagnosis present

## 2021-04-27 DIAGNOSIS — F319 Bipolar disorder, unspecified: Secondary | ICD-10-CM | POA: Diagnosis present

## 2021-04-27 DIAGNOSIS — F17210 Nicotine dependence, cigarettes, uncomplicated: Secondary | ICD-10-CM | POA: Diagnosis present

## 2021-04-27 DIAGNOSIS — F5105 Insomnia due to other mental disorder: Secondary | ICD-10-CM

## 2021-04-27 DIAGNOSIS — Z79899 Other long term (current) drug therapy: Secondary | ICD-10-CM

## 2021-04-27 DIAGNOSIS — M109 Gout, unspecified: Secondary | ICD-10-CM | POA: Diagnosis present

## 2021-04-27 DIAGNOSIS — T380X5A Adverse effect of glucocorticoids and synthetic analogues, initial encounter: Secondary | ICD-10-CM | POA: Diagnosis present

## 2021-04-27 DIAGNOSIS — J45901 Unspecified asthma with (acute) exacerbation: Secondary | ICD-10-CM | POA: Diagnosis present

## 2021-04-27 DIAGNOSIS — R45851 Suicidal ideations: Secondary | ICD-10-CM | POA: Diagnosis present

## 2021-04-27 DIAGNOSIS — F3164 Bipolar disorder, current episode mixed, severe, with psychotic features: Secondary | ICD-10-CM

## 2021-04-27 DIAGNOSIS — I10 Essential (primary) hypertension: Secondary | ICD-10-CM | POA: Diagnosis present

## 2021-04-27 DIAGNOSIS — J189 Pneumonia, unspecified organism: Secondary | ICD-10-CM

## 2021-04-27 DIAGNOSIS — J9601 Acute respiratory failure with hypoxia: Secondary | ICD-10-CM | POA: Diagnosis present

## 2021-04-27 DIAGNOSIS — F99 Mental disorder, not otherwise specified: Secondary | ICD-10-CM

## 2021-04-27 DIAGNOSIS — F411 Generalized anxiety disorder: Secondary | ICD-10-CM | POA: Diagnosis present

## 2021-04-27 DIAGNOSIS — Z91199 Patient's noncompliance with other medical treatment and regimen due to unspecified reason: Secondary | ICD-10-CM

## 2021-04-27 DIAGNOSIS — B20 Human immunodeficiency virus [HIV] disease: Secondary | ICD-10-CM | POA: Diagnosis present

## 2021-04-27 DIAGNOSIS — Z91013 Allergy to seafood: Secondary | ICD-10-CM

## 2021-04-27 LAB — COMPREHENSIVE METABOLIC PANEL
ALT: 16 U/L (ref 0–44)
AST: 22 U/L (ref 15–41)
Albumin: 3.1 g/dL — ABNORMAL LOW (ref 3.5–5.0)
Alkaline Phosphatase: 64 U/L (ref 38–126)
Anion gap: 12 (ref 5–15)
BUN: 12 mg/dL (ref 6–20)
CO2: 20 mmol/L — ABNORMAL LOW (ref 22–32)
Calcium: 8.2 mg/dL — ABNORMAL LOW (ref 8.9–10.3)
Chloride: 101 mmol/L (ref 98–111)
Creatinine, Ser: 1.01 mg/dL (ref 0.61–1.24)
GFR, Estimated: >60 mL/min (ref >60)
Glucose, Bld: 200 mg/dL — ABNORMAL HIGH (ref 70–99)
Potassium: 3.9 mmol/L (ref 3.5–5.1)
Sodium: 133 mmol/L — ABNORMAL LOW (ref 135–145)
Total Bilirubin: 0.6 mg/dL (ref 0.3–1.2)
Total Protein: 5.8 g/dL — ABNORMAL LOW (ref 6.5–8.1)

## 2021-04-27 LAB — CBC WITH DIFFERENTIAL/PLATELET
Abs Immature Granulocytes: 0.02 10*3/uL (ref 0.00–0.07)
Basophils Absolute: 0 10*3/uL (ref 0.0–0.1)
Basophils Relative: 0 %
Eosinophils Absolute: 0 10*3/uL (ref 0.0–0.5)
Eosinophils Relative: 0 %
HCT: 39 % (ref 39.0–52.0)
Hemoglobin: 13 g/dL (ref 13.0–17.0)
Immature Granulocytes: 0 %
Lymphocytes Relative: 2 %
Lymphs Abs: 0.2 10*3/uL — ABNORMAL LOW (ref 0.7–4.0)
MCH: 31.4 pg (ref 26.0–34.0)
MCHC: 33.3 g/dL (ref 30.0–36.0)
MCV: 94.2 fL (ref 80.0–100.0)
Monocytes Absolute: 0.1 10*3/uL (ref 0.1–1.0)
Monocytes Relative: 1 %
Neutro Abs: 6.4 10*3/uL (ref 1.7–7.7)
Neutrophils Relative %: 97 %
Platelets: 278 10*3/uL (ref 150–400)
RBC: 4.14 MIL/uL — ABNORMAL LOW (ref 4.22–5.81)
RDW: 11.9 % (ref 11.5–15.5)
WBC: 6.7 10*3/uL (ref 4.0–10.5)
nRBC: 0 % (ref 0.0–0.2)

## 2021-04-27 LAB — RESP PANEL BY RT-PCR (FLU A&B, COVID) ARPGX2
Influenza A by PCR: POSITIVE — AB
Influenza B by PCR: NEGATIVE
SARS Coronavirus 2 by RT PCR: NEGATIVE

## 2021-04-27 MED ORDER — IPRATROPIUM-ALBUTEROL 0.5-2.5 (3) MG/3ML IN SOLN
3.0000 mL | Freq: Once | RESPIRATORY_TRACT | Status: AC
  Administered 2021-04-27: 3 mL via RESPIRATORY_TRACT
  Filled 2021-04-27: qty 3

## 2021-04-27 MED ORDER — DARUN-COBIC-EMTRICIT-TENOFAF 800-150-200-10 MG PO TABS
1.0000 | ORAL_TABLET | Freq: Every morning | ORAL | Status: DC
  Administered 2021-04-27: 1 via ORAL
  Filled 2021-04-27 (×3): qty 1

## 2021-04-27 MED ORDER — ACETAMINOPHEN 325 MG PO TABS
650.0000 mg | ORAL_TABLET | Freq: Four times a day (QID) | ORAL | Status: DC | PRN
  Administered 2021-04-30: 650 mg via ORAL
  Filled 2021-04-27: qty 2

## 2021-04-27 MED ORDER — SENNOSIDES-DOCUSATE SODIUM 8.6-50 MG PO TABS: 1.0000 | ORAL_TABLET | Freq: Every evening | ORAL | Status: DC | PRN

## 2021-04-27 MED ORDER — OSELTAMIVIR PHOSPHATE 75 MG PO CAPS
75.0000 mg | ORAL_CAPSULE | Freq: Two times a day (BID) | ORAL | Status: DC
  Administered 2021-04-27 – 2021-04-30 (×6): 75 mg via ORAL
  Filled 2021-04-27 (×7): qty 1

## 2021-04-27 MED ORDER — BUSPIRONE HCL 15 MG PO TABS
7.5000 mg | ORAL_TABLET | Freq: Two times a day (BID) | ORAL | Status: DC
  Administered 2021-04-27 – 2021-04-30 (×6): 7.5 mg via ORAL
  Filled 2021-04-27 (×7): qty 1

## 2021-04-27 MED ORDER — ALBUTEROL SULFATE (2.5 MG/3ML) 0.083% IN NEBU: 2.5000 mg | INHALATION_SOLUTION | Freq: Four times a day (QID) | RESPIRATORY_TRACT | Status: DC

## 2021-04-27 MED ORDER — QUETIAPINE FUMARATE 50 MG PO TABS
100.0000 mg | ORAL_TABLET | Freq: Every evening | ORAL | Status: DC | PRN
  Administered 2021-04-28: 100 mg via ORAL
  Filled 2021-04-27: qty 2

## 2021-04-27 MED ORDER — ENOXAPARIN SODIUM 40 MG/0.4ML IJ SOSY
40.0000 mg | PREFILLED_SYRINGE | INTRAMUSCULAR | Status: DC
  Administered 2021-04-27 – 2021-04-29 (×3): 40 mg via SUBCUTANEOUS
  Filled 2021-04-27 (×3): qty 0.4

## 2021-04-27 MED ORDER — IPRATROPIUM-ALBUTEROL 0.5-2.5 (3) MG/3ML IN SOLN
3.0000 mL | RESPIRATORY_TRACT | Status: DC | PRN
  Administered 2021-04-27 (×3): 3 mL via RESPIRATORY_TRACT
  Filled 2021-04-27 (×3): qty 3

## 2021-04-27 MED ORDER — PREDNISONE 20 MG PO TABS: 40.0000 mg | ORAL_TABLET | Freq: Every day | ORAL | Status: DC

## 2021-04-27 MED ORDER — ACETAMINOPHEN 650 MG RE SUPP: 650.0000 mg | Freq: Four times a day (QID) | RECTAL | Status: DC | PRN

## 2021-04-27 MED ORDER — PREDNISONE 20 MG PO TABS
40.0000 mg | ORAL_TABLET | Freq: Every day | ORAL | Status: DC
  Administered 2021-04-27 – 2021-04-28 (×2): 40 mg via ORAL
  Filled 2021-04-27 (×2): qty 2

## 2021-04-27 MED ORDER — MIRTAZAPINE 15 MG PO TABS
15.0000 mg | ORAL_TABLET | Freq: Every day | ORAL | Status: DC
  Administered 2021-04-27 – 2021-04-29 (×3): 15 mg via ORAL
  Filled 2021-04-27 (×4): qty 1

## 2021-04-27 NOTE — ED Triage Notes (Signed)
Hx of asthma and smokes half a pack cigarettes daily. Here for shortness of breath and non productive cough x 1 week. EMS gave albuterol, atrovent and solu medrol en route to ED. Uses an inhaler at home with no relief. 97% on RA with 25-35 cpm RR.

## 2021-04-27 NOTE — ED Notes (Signed)
O2 at 2LPM initiated for comfort.
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
## 2021-04-27 NOTE — ED Notes (Signed)
Lunch tray ordered 

## 2021-04-27 NOTE — ED Notes (Signed)
Pt took nasal cannel off and O2 dropped to 72% . Advised pt to leave o2 on

## 2021-04-27 NOTE — H&P (Addendum)
Date: 04/27/2021               Patient Name:  John [name] MRN: [id number]  DOB: 12/20/1970 Age / Sex: 51 y.o., male   PCP: Pcp, No         Medical Service: Internal Medicine Teaching Service         Attending Physician: Dr. Lottie Mussel, MD    First Contact: Timothy Lasso, MD Pager: SA [phone]  Second Contact: Sanjuana Letters, DO Pager: Maryjean Morn [phone]       After Hours (After 5p/  First Contact Pager: [phone]  weekends / holidays): Second Contact Pager: [phone]   SUBJECTIVE   Chief Complaint: Dyspnea, dry cough  History of Present Illness: John [name] is a 51 year old male with history of asthma, anxiety, HIV, depression, hypertension, alcohol use disorder, cocaine use, bipolar disorder presents with 1 day history of dry cough and dyspnea.  He states that he feels feverish but no recorded temperature.  He also endorsed some chest soreness from increased work of breathing.  He states that throughout the night, last night he had to use his rescue albuterol inhaler with minimal relief.  He denies any recent sick contacts.  He denies headaches, chills, changes in vision, sore throat, chest pain, nausea, vomiting, constipation or diarrhea.  He does not use oxygen at home.  He states that he did receive his flu vaccine this year.   ED Course: In route to the ED patient received Solu-Medrol albuterol inhaler.  In the ED patient received one DuoNeb treatments with minimal relief.  CMP significant for hyponatremia at 133.  CBC no leukocytosis noted.  Respiratory panel significant for influenza A.  Chest x-ray without active cardiopulmonary disease.  Patient placed on 2 L nasal cannula saturating well greater than 95%.  Patient admitted for further evaluation.  Meds:  Current Meds  Medication Sig   albuterol (VENTOLIN HFA) 108 (90 Base) MCG/ACT inhaler Inhale 2 puffs into the lungs every 4 (four) hours as needed for wheezing or shortness of breath.   busPIRone (BUSPAR) 7.5 MG tablet Take  1 tablet (7.5 mg total) by mouth 2 (two) times daily. (Patient taking differently: Take 7.5 mg by mouth 2 (two) times daily as needed (anxiety).)   mirtazapine (REMERON) 15 MG tablet Take 1 tablet (15 mg total) by mouth at bedtime. (Patient taking differently: Take 15 mg by mouth at bedtime as needed (sleep).)   QUEtiapine (SEROQUEL) 100 MG tablet Take 1 tablet (100 mg total) by mouth at bedtime. (Patient taking differently: Take 100 mg by mouth at bedtime as needed (sleep).)   SYMTUZA 800-150-200-10 MG TABS Take 1 tablet by mouth every morning.    Past Medical History:  Diagnosis Date   Alcohol abuse    Alcoholism ([place])    Anxiety    Asthma    Asthma    Bipolar disorder ([place])    Chronic low back pain    Cocaine abuse ([place])    Depression    Gout    Gout    HIV (human immunodeficiency virus infection) ([place])    "dx'd ~ 2 yr ago" (09/29/2012)   HIV (human immunodeficiency virus infection) ([place])    HIV disease ([place])    Homelessness    Hypertension    Marijuana abuse    Mental disorder    Schizophrenia ([place])     Past Surgical History:  Procedure Laterality Date   SKIN GRAFT FULL THICKNESS LEG Left ?   POSTERIOR LEFT LEG  AFTER DOG  BITES    Social:  Lives With: Jackqulyn Livings Occupation: disability Support: Level of Function: Performs ADL's independltly  PCP: None  Substances: 1/2 ppd tobacco, twice a week has 2 beers. Hx of cocaine use  Family History:  Denies familiy history of cancer strokes or heart attacks  Allergies: Allergies as of 04/27/2021 - Review Complete 04/27/2021  Allergen Reaction Noted   Shellfish allergy Anaphylaxis and Swelling 03/14/2011    Review of Systems: A complete ROS was negative except as per HPI.   OBJECTIVE:   Physical Exam: Blood pressure 118/82, pulse 94, temperature 98.8 F (37.1 C), temperature source Oral, resp. rate (!) 22, height 5\' 7"  (1.702 m), weight 68 kg, SpO2 94 %.  Constitutional: ill-appearing gentleman lying in the bed, in  acute distress HENT: normocephalic atraumatic, mucous membranes moist Eyes: conjunctiva non-erythematous Neck: supple Cardiovascular: regular rate and rhythm, no m/r/g Pulmonary/Chest: increased work of breathing on room air, wheezing noted throughout bilateral lung fields, using accessary muscles to breath Abdominal: soft, non-tender, non-distended MSK: normal bulk and tone Neurological: alert & oriented x 3 Skin: warm and dry Psych: Normal behavior, responds to questions appropriately.  Labs: CBC    Component Value Date/Time   WBC 6.7 04/27/2021 1348   RBC 4.14 (L) 04/27/2021 1348   HGB 13.0 04/27/2021 1348   HCT 39.0 04/27/2021 1348   PLT 278 04/27/2021 1348   MCV 94.2 04/27/2021 1348   MCH 31.4 04/27/2021 1348   MCHC 33.3 04/27/2021 1348   RDW 11.9 04/27/2021 1348   LYMPHSABS 0.2 (L) 04/27/2021 1348   MONOABS 0.1 04/27/2021 1348   EOSABS 0.0 04/27/2021 1348   BASOSABS 0.0 04/27/2021 1348     CMP     Component Value Date/Time   NA 133 (L) 04/27/2021 1348   K 3.9 04/27/2021 1348   CL 101 04/27/2021 1348   CO2 20 (L) 04/27/2021 1348   GLUCOSE 200 (H) 04/27/2021 1348   BUN 12 04/27/2021 1348   CREATININE 1.01 04/27/2021 1348   CREATININE 0.98 10/27/2020 1038   CALCIUM 8.2 (L) 04/27/2021 1348   PROT 5.8 (L) 04/27/2021 1348   ALBUMIN 3.1 (L) 04/27/2021 1348   AST 22 04/27/2021 1348   ALT 16 04/27/2021 1348   ALKPHOS 64 04/27/2021 1348   BILITOT 0.6 04/27/2021 1348   GFRNONAA >60 04/27/2021 1348   GFRNONAA 103 07/10/2020 1033   GFRAA 120 07/10/2020 1033    Imaging: DG Chest Port 1 View  Result Date: 04/27/2021 CLINICAL DATA:  Shortness of breath EXAM: PORTABLE CHEST 1 VIEW COMPARISON:  04/21/2021 FINDINGS: The heart size and mediastinal contours are within normal limits. Both lungs are clear. The visualized skeletal structures are unremarkable. IMPRESSION: No acute abnormality of the lungs in AP portable projection. Electronically Signed   By: Delanna Ahmadi M.D.    On: 04/27/2021 09:12    EKG: personally reviewed my interpretation is pending   ASSESSMENT & PLAN:    Assessment & Plan by Problem: Active Problems:   Asthma exacerbation   John [name] is a 51 y.o. with pertinent PMH of asthma, anxiety, HIV, depression, hypertension, alcohol use disorder, cocaine use, bipolar disorder who presented with dyspnea and dry cough and admitted for asthma exacerbation on hospital day 0.  # Moderate Asthma exacerbation Patient presents with 1 day history of dyspnea and dry cough.  He states continuous use of rescue albuterol inhaler through the night with minimal relief.  Patient presented with similar symptoms x2 in the last 3 weeks.  Chest x-ray  unremarkable.  Initial labs, no leukocytosis noted to indicate acute infection, though patient is HIV positive and may not mount appropriate response.CMP unimpressive.  Respiratory panel significant for influenza A positive.  In the setting of upper respiratory infection, likely trigger of asthma exacerbation.  En route to ED patient received Solu-Medrol albuterol inhaler.  In the ED, patient received repeated DuoNebs with minimal relief.  On exam, appeared to be in respiratory distress, with increased work of breathing noted, using accessory muscles. Wheezing auscultated throughout bilateral lung fields. However, Patient was saturating well on 4 L nasal cannula.  During the interview, oxygen was turned off and patient was saturating well on room air >95%.  Due to frequent exacerbation episodes, patient will benefit from long-acting inhaler at discharge. --EKG pending -- Trend CBC --DuoNebs every 4 hours as needed --Prednisone 40 mg for 5 days --Tylenol 650 every 6 hours as needed --O2 supplementation as needed  #Influenza A One day history of dyspnea and dry cough.  Patient endorses episode of subjective fever and chills overnight.  Patient denies sick contacts.  Increased work of breathing noted on exam and wheezing  heard throughout bilateral lung fields.  Patient states he has received flu shot this year.  Resp panel positive for influenza A.  Patient is within window to receive Tamiflu.  Patient's CD4 count is greater than 500, may not be indicated to obtain 20 panel respiratory, unless patient does not clinically improve. --Tamiflu 75 mg twice daily for 5 days --O2 supplementation as needed --DuoNebs as stated above  Hyponatremia Initial labs obtained in the ED significant for CMP-sodium level of 133.  Patient is asymptomatic.  Likely low in the setting of dehydration.  Patient has 1 day history of flulike symptoms with poor oral intake.  We will continue to monitor for mental status changes. --Trend BMP  #HIV CD4 count last assessed November 2022,CD4 count 688 and viral load undetectable.  Patient states he is compliant with his medication.  Repeating CD4 and viral load not indicated at this time. --Continue Symtuza daily  Bipolar disorder Major depression with psychotic features GAD Patient appears to be at baseline. Home medications include Seroquel 100 mg at bedtime, mirtazapine 15 mg at bedtime, BuSpar 7.5 mg twice daily. -- Continue home medications  Polysubstance use disorder On prior admissions, patient was found to have cocaine and marijuana positive on UDS; Most recently 04/21/2021.  It is noted that with the drug use patient has a tendency to have suicidal ideations.  However, during this admission, patient denies cocaine or marijuana use. Patient appears to be at baseline, answering questions appropriately. --Monitor for signs of withdrawal   Diet: Normal VTE: Enoxaparin IVF: None,None Code: Full  Prior to Admission Living Arrangement: Home, living with fiance  Anticipated Discharge Location: Home Barriers to Discharge: None   Dispo: Admit patient to Observation with expected length of stay less than 2 midnights.  Signed: Timothy Lasso, MD Internal Medicine Resident  PGY-1 Pager: [phone]  04/27/2021, 4:14 PM

## 2021-04-28 ENCOUNTER — Encounter (HOSPITAL_COMMUNITY): Payer: Self-pay | Admitting: Internal Medicine

## 2021-04-28 ENCOUNTER — Other Ambulatory Visit: Payer: Self-pay

## 2021-04-28 DIAGNOSIS — J9601 Acute respiratory failure with hypoxia: Secondary | ICD-10-CM | POA: Diagnosis present

## 2021-04-28 DIAGNOSIS — F17210 Nicotine dependence, cigarettes, uncomplicated: Secondary | ICD-10-CM | POA: Diagnosis present

## 2021-04-28 DIAGNOSIS — J4541 Moderate persistent asthma with (acute) exacerbation: Principal | ICD-10-CM

## 2021-04-28 DIAGNOSIS — B20 Human immunodeficiency virus [HIV] disease: Secondary | ICD-10-CM | POA: Diagnosis present

## 2021-04-28 DIAGNOSIS — F411 Generalized anxiety disorder: Secondary | ICD-10-CM | POA: Diagnosis present

## 2021-04-28 DIAGNOSIS — Z91013 Allergy to seafood: Secondary | ICD-10-CM | POA: Diagnosis not present

## 2021-04-28 DIAGNOSIS — E871 Hypo-osmolality and hyponatremia: Secondary | ICD-10-CM | POA: Diagnosis present

## 2021-04-28 DIAGNOSIS — Z91199 Patient's noncompliance with other medical treatment and regimen due to unspecified reason: Secondary | ICD-10-CM | POA: Diagnosis not present

## 2021-04-28 DIAGNOSIS — J101 Influenza due to other identified influenza virus with other respiratory manifestations: Secondary | ICD-10-CM | POA: Diagnosis present

## 2021-04-28 DIAGNOSIS — R0602 Shortness of breath: Secondary | ICD-10-CM | POA: Diagnosis present

## 2021-04-28 DIAGNOSIS — Z20822 Contact with and (suspected) exposure to covid-19: Secondary | ICD-10-CM | POA: Diagnosis present

## 2021-04-28 DIAGNOSIS — M109 Gout, unspecified: Secondary | ICD-10-CM | POA: Diagnosis present

## 2021-04-28 DIAGNOSIS — Z79899 Other long term (current) drug therapy: Secondary | ICD-10-CM | POA: Diagnosis not present

## 2021-04-28 DIAGNOSIS — F319 Bipolar disorder, unspecified: Secondary | ICD-10-CM | POA: Diagnosis present

## 2021-04-28 DIAGNOSIS — R45851 Suicidal ideations: Secondary | ICD-10-CM | POA: Diagnosis present

## 2021-04-28 DIAGNOSIS — I10 Essential (primary) hypertension: Secondary | ICD-10-CM | POA: Diagnosis present

## 2021-04-28 DIAGNOSIS — T380X5A Adverse effect of glucocorticoids and synthetic analogues, initial encounter: Secondary | ICD-10-CM | POA: Diagnosis present

## 2021-04-28 LAB — BLOOD GAS, VENOUS
Acid-Base Excess: 5.5 mmol/L — ABNORMAL HIGH (ref 0.0–2.0)
Bicarbonate: 28.9 mmol/L — ABNORMAL HIGH (ref 20.0–28.0)
FIO2: 21 %
O2 Saturation: 92.4 %
Patient temperature: 36.7
pCO2, Ven: 37 mmHg — ABNORMAL LOW (ref 44–60)
pH, Ven: 7.5 — ABNORMAL HIGH (ref 7.25–7.43)
pO2, Ven: 57 mmHg — ABNORMAL HIGH (ref 32–45)

## 2021-04-28 LAB — BLOOD GAS, ARTERIAL
Acid-Base Excess: 0.2 mmol/L (ref 0.0–2.0)
Acid-Base Excess: 0.3 mmol/L (ref 0.0–2.0)
Bicarbonate: 24.2 mmol/L (ref 20.0–28.0)
Bicarbonate: 24.3 mmol/L (ref 20.0–28.0)
Drawn by: 252031
Drawn by: 137461
FIO2: 28
FIO2: 32 %
O2 Saturation: 95.2 %
O2 Saturation: 98.5 %
Patient temperature: 37.7
Patient temperature: 37
pCO2 arterial: 40.2 mmHg (ref 32–48)
pCO2 arterial: 38.5 mmHg (ref 32–48)
pH, Arterial: 7.401 (ref 7.35–7.45)
pH, Arterial: 7.416 (ref 7.35–7.45)
pO2, Arterial: 77.2 mmHg — ABNORMAL LOW (ref 83–108)
pO2, Arterial: 114 mmHg — ABNORMAL HIGH (ref 83–108)

## 2021-04-28 LAB — CBC WITH DIFFERENTIAL/PLATELET
Abs Immature Granulocytes: 0.03 10*3/uL (ref 0.00–0.07)
Basophils Absolute: 0 10*3/uL (ref 0.0–0.1)
Basophils Relative: 0 %
Eosinophils Absolute: 0 10*3/uL (ref 0.0–0.5)
Eosinophils Relative: 0 %
HCT: 38.6 % — ABNORMAL LOW (ref 39.0–52.0)
Hemoglobin: 13.3 g/dL (ref 13.0–17.0)
Immature Granulocytes: 0 %
Lymphocytes Relative: 2 %
Lymphs Abs: 0.3 10*3/uL — ABNORMAL LOW (ref 0.7–4.0)
MCH: 31.8 pg (ref 26.0–34.0)
MCHC: 34.5 g/dL (ref 30.0–36.0)
MCV: 92.3 fL (ref 80.0–100.0)
Monocytes Absolute: 0.4 10*3/uL (ref 0.1–1.0)
Monocytes Relative: 4 %
Neutro Abs: 10.1 10*3/uL — ABNORMAL HIGH (ref 1.7–7.7)
Neutrophils Relative %: 94 %
Platelets: 305 10*3/uL (ref 150–400)
RBC: 4.18 MIL/uL — ABNORMAL LOW (ref 4.22–5.81)
RDW: 11.9 % (ref 11.5–15.5)
WBC: 10.8 10*3/uL — ABNORMAL HIGH (ref 4.0–10.5)
nRBC: 0 % (ref 0.0–0.2)

## 2021-04-28 LAB — BASIC METABOLIC PANEL
Anion gap: 13 (ref 5–15)
BUN: 10 mg/dL (ref 6–20)
CO2: 22 mmol/L (ref 22–32)
Calcium: 9.1 mg/dL (ref 8.9–10.3)
Chloride: 101 mmol/L (ref 98–111)
Creatinine, Ser: 0.89 mg/dL (ref 0.61–1.24)
GFR, Estimated: >60 mL/min (ref >60)
Glucose, Bld: 199 mg/dL — ABNORMAL HIGH (ref 70–99)
Potassium: 3.8 mmol/L (ref 3.5–5.1)
Sodium: 136 mmol/L (ref 135–145)

## 2021-04-28 MED ORDER — BICTEGRAVIR-EMTRICITAB-TENOFOV 50-200-25 MG PO TABS
1.0000 | ORAL_TABLET | Freq: Every day | ORAL | Status: DC
  Administered 2021-04-29 – 2021-04-30 (×2): 1 via ORAL
  Filled 2021-04-28 (×2): qty 1

## 2021-04-28 MED ORDER — METHYLPREDNISOLONE SODIUM SUCC 40 MG IJ SOLR: 40.0000 mg | Freq: Every day | INTRAMUSCULAR | Status: DC

## 2021-04-28 MED ORDER — ALBUTEROL SULFATE (2.5 MG/3ML) 0.083% IN NEBU
2.5000 mg | INHALATION_SOLUTION | RESPIRATORY_TRACT | Status: DC | PRN
  Administered 2021-04-28 – 2021-04-29 (×2): 2.5 mg via RESPIRATORY_TRACT
  Filled 2021-04-28 (×2): qty 3

## 2021-04-28 MED ORDER — SODIUM CHLORIDE 0.9 % IV SOLN
500.0000 mg | Freq: Once | INTRAVENOUS | Status: DC
  Filled 2021-04-28: qty 5

## 2021-04-28 MED ORDER — METHYLPREDNISOLONE SODIUM SUCC 125 MG IJ SOLR
60.0000 mg | Freq: Once | INTRAMUSCULAR | Status: AC
  Administered 2021-04-28: 60 mg via INTRAVENOUS
  Filled 2021-04-28: qty 2

## 2021-04-28 MED ORDER — IPRATROPIUM-ALBUTEROL 0.5-2.5 (3) MG/3ML IN SOLN
3.0000 mL | RESPIRATORY_TRACT | Status: DC
  Administered 2021-04-28 – 2021-04-29 (×9): 3 mL via RESPIRATORY_TRACT
  Filled 2021-04-28 (×8): qty 3

## 2021-04-28 MED ORDER — MOMETASONE FURO-FORMOTEROL FUM 100-5 MCG/ACT IN AERO
2.0000 | INHALATION_SPRAY | Freq: Two times a day (BID) | RESPIRATORY_TRACT | Status: DC
  Administered 2021-04-28 (×2): 2 via RESPIRATORY_TRACT
  Filled 2021-04-28: qty 8.8

## 2021-04-28 MED ORDER — METHYLPREDNISOLONE SODIUM SUCC 40 MG IJ SOLR: 30.0000 mg | Freq: Every day | INTRAMUSCULAR | Status: DC

## 2021-04-28 MED ORDER — DARUN-COBIC-EMTRICIT-TENOFAF 800-150-200-10 MG PO TABS
1.0000 | ORAL_TABLET | Freq: Every day | ORAL | Status: DC
  Administered 2021-04-28: 1 via ORAL
  Filled 2021-04-28: qty 1

## 2021-04-28 MED ORDER — MOMETASONE FURO-FORMOTEROL FUM 100-5 MCG/ACT IN AERO
2.0000 | INHALATION_SPRAY | Freq: Two times a day (BID) | RESPIRATORY_TRACT | Status: DC
  Administered 2021-04-28 – 2021-04-30 (×4): 2 via RESPIRATORY_TRACT
  Filled 2021-04-28: qty 8.8

## 2021-04-28 NOTE — Progress Notes (Signed)
Subjective: I seen and evaluated John [name] at bedside. He appeared to be in respiratory distress, using his accessory muscles to breath. He endorse improvement in his breathing following duoneb treatment. He still experiencing dry cough and denies sputum production. No fever or chills. Pt is still on nasal canula 1L. He denies CP or N/V.  Objective:  Vital signs in last 24 hours: Vitals:   04/28/21 0600 04/28/21 0727 04/28/21 0745 04/28/21 0829  BP: 122/66 (!) 140/109  123/72  Pulse: 98 64 88 (!) 123  Resp: (!) 25 (!) 23 (!) 22 (!) 40  Temp: 99.5 F (37.5 C) 98.2 F (36.8 C)  98.2 F (36.8 C)  TempSrc: Oral Oral    SpO2: 99% 100%  98%  Weight:      Height:       Physical Exam Constitutional:      General: He is in acute distress.     Interventions: Nasal cannula in place.  Cardiovascular:     Rate and Rhythm: Regular rhythm. Tachycardia present.     Heart sounds: Normal heart sounds.  Pulmonary:     Effort: Tachypnea, accessory muscle usage, respiratory distress and retractions present.     Breath sounds: Examination of the right-middle field reveals wheezing and rhonchi. Examination of the left-middle field reveals rhonchi. Examination of the right-lower field reveals wheezing and rhonchi. Examination of the left-lower field reveals wheezing and rhonchi. Wheezing and rhonchi present.  Musculoskeletal:     Right lower leg: No edema.     Left lower leg: No edema.  Skin:    General: Skin is warm and dry.  Psychiatric:        Mood and Affect: Mood is anxious.     Assessment/Plan:  Active Problems:   Asthma exacerbation   Moderate Asthma exacerbation Overnight pt continued to have difficulty breathing, speaking in short sentences. He received IV solumedrol 60mg  once and started on Dulera with partial improvement. Breathing treatment scheduled Q4H with last treatment around 8AM this morning. Page received from nurse this morning that patient MEWS red and continued to  have worsening of breathing. Rapid response was alerted and plan to see the patient. On exam, pt noted to be using accessory muscles to breath, wheezing and coarse breath sounds auscultated on lung exam. Pt is at risk for respiratory failure. Pt placed on NPO to avoid aspiration. ABG obtained last night were reassuring; pH 7.4 CO2 40. Will repeat ABG. Oral prednisone switched to IV prednisone to avoid anything by mouth. Same efficacy. If no improvement in respirations following serial Duonebs, will need to trial patient on BiPAP for respiratory support. In the event patient clinically deteriorates and/or abnormal ABG, will consult PCCM, pt will need intubation. --Repeat ABG pending --NPO until further notice --Duonebs 83mL Q4H --Dulera 2 puff BID --Albuterol nebulizer Q2H PRN --Prednisone 40mg  --> IV Prednisone 40mg  daily   Influenza A Pt started on Tamiflu yesterday when found positive for influenza A on admission. He was within window to receive treatment. This viral infection, likely triggered his asthma exacerbation and a component of patients poor respiratory status. Pt continues to endorse dry cough. He remains afebrile and respiratory rate continues to fluctuate 25-38 RR. CXR was obtained on admission and was negative. Due to HIV status and chronic smoking history, pt is at risk for developing superimposed bacterial infection. Leukocytosis noted on CBC, likely secondary to prednisone use in the setting of asthma exacerbation treatment. Will continue to monitor for signs of superimposed infection. --Tamiflu  75mg  BID for 5 days; End date:04/27/21 --Breathing rx stated above  HIV CD4 count last assessed November 2022,CD4 count 688 and viral load undetectable. Although pt initially endorse compliance with medications on admission; upon further questioning today, pt admits that he may have "missed doses". In the setting of acute infection and self endorsed noncompliance, will obtain CD4 count. This  could guide potential need for abx. Pt's home regimen include Symtuza, which per pharmacy has interactions with Dulera. To avoid adverse reactions, during hospitalization, will change Symtuza to [employer]. This medication adjustment was reccommended by Infectious disease team. --Repeat CD4 count --Symtuza -->Biktarvy  Bipolar disorder Major depression with psychotic features GAD Patient appears to be at baseline. Home medications include Seroquel 100 mg at bedtime, mirtazapine 15 mg at bedtime, BuSpar 7.5 mg twice daily. -- Continue home medications  Hyponatremia, resolved Current Na level on BMP- 136 WNL   Prior to Admission Living Arrangement: Anticipated Discharge Location: Barriers to Discharge: Dispo: Anticipated discharge in approximately 1-2 day(s).   John Lasso, MD 04/28/2021, 9:06 AM Pager: [phone] After 5pm on weekdays and 1pm on weekends: On Call pager [phone]

## 2021-04-28 NOTE — Progress Notes (Signed)
MEWS Guidelines - (patients age 50 and over)

## 2021-04-28 NOTE — Plan of Care (Signed)
Paged for increased respiratory rate and increased work of breathing on 2L [place].  John [name] 51 year old male with history of asthma, HIV presenting with 1 day history of dry cough and dyspnea.  Found to be flu positive.  Admitted for acute asthma exacerbation in the setting flu.  Given prednisone 40 mg and DuoNeb as needed.  Patient seen at bedside, using accessory muscles of breathing, unable to speak in complete sentences secondary to breathlessness, respiratory therapy reports he is satting 95% on 2L [place].  Patient is currently receiving another breathing treatment.  Patient reports he feels better once he is on the breathing treatment though still having difficulties.  Currently alert, no signs of depression consciousness.  Blood pressure 117/70, pulse 91, temperature 99.4 F (37.4 C), temperature source Oral, resp. rate (!) 38, height 5\' 7"  (1.702 m), weight 68 kg, SpO2 99 %.   On exam, patient appears uncomfortable, tachypnea, using accessory muscles of breathing on 2 LNC, satting 95%, diffuse wheezing bilaterally.  Mild tachycardia on auscultation.  Awake, alert, oriented to situation.  #Acute hypoxic respiratory failure 2/2 acute asthma exacerbation On assessment, patient presenting with acute asthma exacerbation in the setting of flu.  CXR from earlier today clear.  Has history of HIV though last CD4 count within normal limits.  Afebrile without leukocytosis.  Low concern for secondary bacterial infection at this time.  Still satting well on 2L [place].  Given tachypnea 30+, use of accessory muscles and inability to speak full sentences, likely this is a severe asthma exacerbation. Patient was given 1 dose of 40 mg prednisone in the ED, though may benefit from IV systemic glucocorticoids given severity.  Would benefit from scheduled breathing treatments.  Will obtain ABG to assess for hypercapnia.  Also wonder if patient has a mixed picture of asthma and COPD given history of tobacco  use.  Plan: -IV solumedrol 60mg  once -Start Dulera -Scheduled breathing treatments -ABG -CTM for signs of respiratory failure

## 2021-04-29 ENCOUNTER — Inpatient Hospital Stay (HOSPITAL_COMMUNITY): Payer: Medicaid Other

## 2021-04-29 LAB — BASIC METABOLIC PANEL
Anion gap: 9 (ref 5–15)
BUN: 17 mg/dL (ref 6–20)
CO2: 26 mmol/L (ref 22–32)
Calcium: 8.8 mg/dL — ABNORMAL LOW (ref 8.9–10.3)
Chloride: 106 mmol/L (ref 98–111)
Creatinine, Ser: 0.87 mg/dL (ref 0.61–1.24)
GFR, Estimated: >60 mL/min (ref >60)
Glucose, Bld: 138 mg/dL — ABNORMAL HIGH (ref 70–99)
Potassium: 4.3 mmol/L (ref 3.5–5.1)
Sodium: 141 mmol/L (ref 135–145)

## 2021-04-29 LAB — CBC WITH DIFFERENTIAL/PLATELET
Abs Immature Granulocytes: 0.07 10*3/uL (ref 0.00–0.07)
Basophils Absolute: 0 10*3/uL (ref 0.0–0.1)
Basophils Relative: 0 %
Eosinophils Absolute: 0 10*3/uL (ref 0.0–0.5)
Eosinophils Relative: 0 %
HCT: 36.7 % — ABNORMAL LOW (ref 39.0–52.0)
Hemoglobin: 12.5 g/dL — ABNORMAL LOW (ref 13.0–17.0)
Immature Granulocytes: 0 %
Lymphocytes Relative: 4 %
Lymphs Abs: 0.6 10*3/uL — ABNORMAL LOW (ref 0.7–4.0)
MCH: 31.6 pg (ref 26.0–34.0)
MCHC: 34.1 g/dL (ref 30.0–36.0)
MCV: 92.7 fL (ref 80.0–100.0)
Monocytes Absolute: 1.3 10*3/uL — ABNORMAL HIGH (ref 0.1–1.0)
Monocytes Relative: 8 %
Neutro Abs: 14.4 10*3/uL — ABNORMAL HIGH (ref 1.7–7.7)
Neutrophils Relative %: 88 %
Platelets: 295 10*3/uL (ref 150–400)
RBC: 3.96 MIL/uL — ABNORMAL LOW (ref 4.22–5.81)
RDW: 12.5 % (ref 11.5–15.5)
WBC: 16.3 10*3/uL — ABNORMAL HIGH (ref 4.0–10.5)
nRBC: 0 % (ref 0.0–0.2)

## 2021-04-29 LAB — CD4/CD8 (T-HELPER/T-SUPPRESSOR CELL)
CD4 absolute: 91 /uL — ABNORMAL LOW (ref 400–1790)
CD4%: 39.21 % (ref 33–65)
CD8 T Cell Abs: 66 /uL — ABNORMAL LOW (ref 190–1000)
CD8tox: 28.54 % (ref 12–40)
Ratio: 1.37 (ref 1.0–3.0)
Total lymphocyte count: 232 /uL — ABNORMAL LOW (ref 1000–4000)

## 2021-04-29 MED ORDER — IPRATROPIUM-ALBUTEROL 0.5-2.5 (3) MG/3ML IN SOLN
3.0000 mL | Freq: Four times a day (QID) | RESPIRATORY_TRACT | Status: DC
  Administered 2021-04-29: 3 mL via RESPIRATORY_TRACT
  Filled 2021-04-29: qty 3

## 2021-04-29 MED ORDER — PREDNISONE 20 MG PO TABS
40.0000 mg | ORAL_TABLET | Freq: Every day | ORAL | Status: DC
  Administered 2021-04-29 – 2021-04-30 (×2): 40 mg via ORAL
  Filled 2021-04-29 (×2): qty 2

## 2021-04-29 MED ORDER — IPRATROPIUM-ALBUTEROL 0.5-2.5 (3) MG/3ML IN SOLN
3.0000 mL | Freq: Two times a day (BID) | RESPIRATORY_TRACT | Status: DC
  Administered 2021-04-30: 3 mL via RESPIRATORY_TRACT
  Filled 2021-04-29: qty 3

## 2021-04-29 MED ORDER — SULFAMETHOXAZOLE-TRIMETHOPRIM 800-160 MG PO TABS
1.0000 | ORAL_TABLET | Freq: Every day | ORAL | Status: DC
  Administered 2021-04-29 – 2021-04-30 (×2): 1 via ORAL
  Filled 2021-04-29 (×2): qty 1

## 2021-04-29 NOTE — Discharge Summary (Addendum)
Name: John [name] MRN: [id number] DOB: 12-24-1970 51 y.o. PCP: Pcp, No  Date of Admission: 04/27/2021  8:21 AM Date of Discharge: 04/30/21 Attending Physician: Lucious Groves, DO  Discharge Diagnosis: 1.  Moderate asthma exacerbation 2.  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   albuterol 108 (90 Base) MCG/ACT inhaler Commonly known as: VENTOLIN HFA Inhale 2 puffs into the lungs every 4 (four) hours as needed for wheezing or shortness of breath. What changed: Another medication with the same name was removed. Continue taking this medication, and follow the directions you see here.   bictegravir-emtricitabine-tenofovir AF 50-200-25 MG Tabs tablet Commonly known as: BIKTARVY Take 1 tablet by mouth daily. Start taking on: May 01, 2021   busPIRone 7.5 MG tablet Commonly known as: BUSPAR Take 1 tablet (7.5 mg total) by mouth 2 (two) times daily as needed (anxiety).   mirtazapine 15 MG tablet Commonly known as: REMERON Take 1 tablet (15 mg total) by mouth at bedtime as needed (sleep).   mometasone-formoterol 100-5 MCG/ACT Aero Commonly known as: DULERA Inhale 2 puffs into the lungs 2 (two) times daily.   oseltamivir 75 MG capsule Commonly known as: TAMIFLU Take 1 capsule (75 mg total) by mouth 2 (two) times daily for 2 days.   predniSONE 20 MG tablet Commonly known as: DELTASONE Take 2 tablets (40 mg total) by mouth daily with  breakfast for 3 days. Start taking on: May 01, 2021   QUEtiapine 100 MG tablet Commonly known as: SEROQUEL Take 1 tablet (100 mg total) by mouth at bedtime as needed (sleep).   sulfamethoxazole-trimethoprim 800-160 MG tablet Commonly known as: BACTRIM DS Take 1 tablet by mouth 3 (three) times a week. Start taking on: May 04, 2021        Disposition and follow-up:   [name] was discharged from [facility] in Stable condition.  At the hospital follow up visit please address:  1.  Follow-up with infectious disease due to low CD4 count and recent change in treatment, Symtuza to [place]; Repeat CD4 count.  Call patient with results of sputum culture Follow up in [facility] clinic in 1 week to address pending lab results. Repeat CXR 4-6 weeks for resolution of infection;  2.  Labs / imaging needed at time of follow-up: CXR  3.  Pending labs/ test needing follow-up: Fungitell serum; Sputum culture  Follow-up Appointments:  Follow-up [facility] Follow up in 1 week(s).   Contact information: [address] [place], FNP Follow up on 05/12/2021.   Specialty: Infectious Diseases Why: 915AM Contact information: [address] [place] [address] [facility] Course by problem list:  John [name] is a 51  y.o. with pertinent PMH of asthma, anxiety, HIV, depression, hypertension, alcohol use disorder, cocaine use, bipolar disorder who presented with dyspnea and dry cough and admitted for asthma exacerbation.   1. Asthma Exacerbation/Influenza A- Patient presented to the ED with symptoms consistent with asthma exacerbation.  En route to the ED patient received Solu-Medrol albuterol inhaler.  In the ED patient received DuoNeb treatment with minimal relief.  On exam he was noted to be in respiratory  distress with increased work of breathing, using accessory muscles, speaking in short sentences and wheezing auscultated throughout bilateral lung fields. Patient was started on DuoNebs, Dulera, prednisone, O2 supplementation as needed, and Tylenol for treatment of asthma exacerbation. Respiratory panel obtained in the ED was positive for influenza A. Symptom onset was 1 day ago, patient was within window to begin Tamiflu treatment. ABGs obtained were normal. There was concern for possible respiratory failure due to noted respiratory distress however, with repeated scheduled breathing treatments patient began to improve. Patient remained afebrile and respiratory status was back to baseline at time of discharge. Patient was saturating well on RA, >95%.    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 Lasted assessed Nov 2022, CD4 count 688. In the setting of acute infection, CD4% being WNL, and viral load undetectable CD4 count may be falsely lower than expected, per ID team. Patient was started on Bactrim prophylaxis. Patient then reported productive cough, greenish phlegm. Sputum culture obtained. Repeat CXR reveal multifocal infiltrates which worse from initial CXR obtained on admission which was negative. This new change in imaging could be due to infectious course of Influenza A or new onset respiratory infection. LDH levels were mildly elevated at 239. Fungitell serum level obtained, pending results.  Patient's HIV was managed with Biktarvy versus Symtuza (home medication) due to interaction with inhaler [facility]) medications. Patient stable at time of discharge.     Discharge Exam:   BP 121/75 (BP Location: Right Arm)    Pulse 81    Temp 98.9 F (37.2 C) (Oral)    Resp 17    Ht 5\' 7"  (1.702 m)    Wt 68 kg    SpO2 98%    BMI 23.48 kg/m  Discharge exam: Physical Exam Constitutional:      General: He is awake. He is not in acute  distress. HENT:     Head:     Jaw: Swelling present.      Comments: Nonpainful Soft tissue swelling at TMJ bilaterally;  Cardiovascular:     Rate and Rhythm: Normal rate and regular rhythm.     Heart sounds: Normal heart sounds.  Pulmonary:     Effort: Pulmonary effort is normal.     Breath sounds: Normal breath sounds and air entry. No wheezing.  Musculoskeletal:     Right lower leg: No edema.     Left lower leg: No edema.  Skin:    General: Skin is warm and dry.  Neurological:     Mental Status: He is alert.  Psychiatric:        Behavior: Behavior normal. Behavior is cooperative.     Pertinent Labs, Studies, and Procedures:  CBC Latest Ref Rng & Units 04/30/2021 04/29/2021 04/28/2021  WBC 4.0 - 10.5 K/uL 11.0(H) 16.3(H) 10.8(H)  Hemoglobin 13.0 - 17.0 g/dL 11.8(L) 12.5(L) 13.3  Hematocrit 39.0 - 52.0 % 35.5(L) 36.7(L) 38.6(L)  Platelets 150 - 400 K/uL 250 295 305  BMP Latest Ref Rng & Units 04/29/2021 04/28/2021 04/27/2021  Glucose 70 - 99 mg/dL 138(H) 199(H) 200(H)  BUN 6 - 20 mg/dL 17 10 12   Creatinine 0.61 - 1.24 mg/dL 0.87 0.89 1.01  BUN/Creat Ratio 6 - 22 (calc) - - -  Sodium 135 - 145 mmol/L 141 136 133(L)  Potassium 3.5 - 5.1 mmol/L 4.3 3.8 3.9  Chloride 98 - 111 mmol/L 106 101 101  CO2 22 - 32 mmol/L 26 22 20(L)  Calcium 8.9 - 10.3 mg/dL 8.8(L) 9.1 8.2(L)   DG CHEST PORT 1 VIEW  Result Date: 04/29/2021 CLINICAL DATA:  Shortness of breath, asthma exacerbation. EXAM: PORTABLE CHEST 1 VIEW COMPARISON:  Chest x-ray 02/30/2023, CT chest 11/13/2014 FINDINGS: The heart and mediastinal contours are within normal limits. Interval development of right perihilar airspace opacity and patchy airspace opacity within left lower lung zone. No pulmonary edema. No pleural effusion. No pneumothorax. No acute osseous abnormality.  Old healed left rib fractures. IMPRESSION: Interval development of right perihilar airspace opacity and patchy airspace opacity within left lower lung zone.  Findings suggestive of multifocal pneumonia. Followup PA and lateral chest X-ray is recommended in 3-4 weeks following therapy to ensure resolution. Electronically Signed   By: Iven Finn M.D.   On: 04/29/2021 20:18   DG Chest Port 1 View  Result Date: 04/27/2021 CLINICAL DATA:  Shortness of breath EXAM: PORTABLE CHEST 1 VIEW COMPARISON:  04/21/2021 FINDINGS: The heart size and mediastinal contours are within normal limits. Both lungs are clear. The visualized skeletal structures are unremarkable. IMPRESSION: No acute abnormality of the lungs in AP portable projection. Electronically Signed   By: Delanna Ahmadi M.D.   On: 04/27/2021 09:12   DG Chest Port 1 View  Result Date: 04/21/2021 CLINICAL DATA:  Shortness of breath EXAM: PORTABLE CHEST 1 VIEW COMPARISON:  Previous studies including the examination of 04/08/2021 FINDINGS: Cardiac size is within normal limits. There are no signs of pulmonary edema or focal pulmonary consolidation. Low position of diaphragms suggests COPD. There is no pleural effusion or pneumothorax. IMPRESSION: There are no new infiltrates or signs of pulmonary edema. Electronically Signed   By: Elmer Picker M.D.   On: 04/21/2021 11:42   DG Chest Port 1 View  Result Date: 04/08/2021 CLINICAL DATA:  Shortness of breath EXAM: PORTABLE CHEST 1 VIEW COMPARISON:  Chest radiograph 01/25/2021 FINDINGS: The cardiomediastinal silhouette is within normal limits. There is no focal consolidation or pulmonary edema. There is no pleural effusion or pneumothorax There is no acute osseous abnormality. IMPRESSION: No radiographic evidence of acute cardiopulmonary process. Electronically Signed   By: Valetta Mole M.D.   On: 04/08/2021 10:53     Discharge Instructions: Please continue taking prednisone 40 mg daily; last day 05/04/2021 Please continue taking Tamiflu 75 mg twice daily; last day 05/02/2021 Please continue taking Bactrim every Monday Wednesday and Friday Please continue  taking your Biktarvy daily; you have an upcoming appointment with infectious disease 05/12/2021 Please follow-up in the [facility] internal medicine clinic in 1 week.   Signed: Timothy Lasso, MD 04/30/2021, 11:32 AM   Pager: [phone]

## 2021-04-29 NOTE — Progress Notes (Signed)
° °  Subjective: I seen and evaluated John [name] at bedside.  He was sitting up in bed receiving breathing treatment.  He endorsed significant improvement in his breathing overall.  He does endorse some productive cough of greenish phlegm however denies fevers or chills.  He states he feels ready to go home.  Objective:  Vital signs in last 24 hours: Vitals:   04/29/21 0100 04/29/21 0318 04/29/21 0607 04/29/21 0802  BP: 128/72  121/82 121/72  Pulse: (!) 108  90 85  Resp: 20  20 16   Temp: 98.4 F (36.9 C)  98 F (36.7 C) 98.7 F (37.1 C)  TempSrc: Oral  Oral Oral  SpO2: 95% 94% 97% 96%  Weight:      Height:       Physical Exam Constitutional:      General: He is awake. He is not in acute distress. Cardiovascular:     Rate and Rhythm: Normal rate and regular rhythm.     Heart sounds: Normal heart sounds.  Pulmonary:     Effort: Pulmonary effort is normal. No tachypnea, accessory muscle usage or respiratory distress.     Breath sounds: Normal air entry. Wheezing present.     Comments: Wheezing noted, but much improved since admission Musculoskeletal:     Right lower leg: No edema.     Left lower leg: No edema.  Skin:    General: Skin is warm and dry.  Neurological:     General: No focal deficit present.     Mental Status: He is alert.  Psychiatric:        Behavior: Behavior is cooperative.     Assessment/Plan:  Active Problems:   HIV disease (HCC)   Asthma exacerbation   Influenza A   Moderate Asthma Exacerbation, resolving No acute events overnight. Repeat blood gases were reassuring as CO2 levels without significant change. Pt's respiratory status continues to improve and currently saturating well on RA. Pt is no longer NPO and taking meals/meds orally without concern for choking/aspiration. White count continues to trend up 10.8-->16.3 w/ neutrophil predominance 14.4. Pt is at risk for bacterial infection given HIV status, smoking hx and positive flu swab.  Concomitantly pt is receiving prednisone which can elevate white count. Although patient remains afebrile, will continue to monitor for signs of superimposed infection.  --Duonebs 28mL Q4H --Dulera 2 puff BID --Albuterol nebulizer Q2H PRN --Prednisone 40mg  daily; end date: 05/04/2021  HIV CD4 count last assessed November 2022,CD4 count 688 and viral load undetectable. Repeat levels pending. Pt previously on [place] however, on admission, pt switched to [place]. Symtuza has interactions with Dulera and it is safer to adjust HIV treatment to avoid adverse reactions, per ID and Pharmacy.  Repeat CD4 count low at 91.  May need to initiate antibiotics prophylaxis, Bactrim and/or consult infectious disease for further evaluation.  Patient may likely be noncompliant regimen prior to hospitalization. --Continue Biktarvy  Influenza A Pt is afebrile ----Tamiflu 75mg  BID for 5 days; End date:04/27/21  Bipolar disorder Major depression with psychotic features GAD Patient appears to be at baseline. Home medications include Seroquel 100 mg at bedtime, mirtazapine 15 mg at bedtime, BuSpar 7.5 mg twice daily. -- Continue home medications   Prior to Admission Living Arrangement: Anticipated Discharge Location: Barriers to Discharge: Dispo: Anticipated discharge in approximately 1-2 day(s).   Timothy Lasso, MD 04/29/2021, 8:05 AM Pager: [phone] After 5pm on weekdays and 1pm on weekends: On Call pager [phone]

## 2021-04-30 ENCOUNTER — Other Ambulatory Visit (HOSPITAL_COMMUNITY): Payer: Self-pay

## 2021-04-30 ENCOUNTER — Other Ambulatory Visit: Payer: Self-pay | Admitting: Pharmacist

## 2021-04-30 DIAGNOSIS — B20 Human immunodeficiency virus [HIV] disease: Secondary | ICD-10-CM

## 2021-04-30 LAB — CBC WITH DIFFERENTIAL/PLATELET
Abs Immature Granulocytes: 0.14 10*3/uL — ABNORMAL HIGH (ref 0.00–0.07)
Basophils Absolute: 0 10*3/uL (ref 0.0–0.1)
Basophils Relative: 0 %
Eosinophils Absolute: 0 10*3/uL (ref 0.0–0.5)
Eosinophils Relative: 0 %
HCT: 35.5 % — ABNORMAL LOW (ref 39.0–52.0)
Hemoglobin: 11.8 g/dL — ABNORMAL LOW (ref 13.0–17.0)
Immature Granulocytes: 1 %
Lymphocytes Relative: 13 %
Lymphs Abs: 1.4 10*3/uL (ref 0.7–4.0)
MCH: 31.3 pg (ref 26.0–34.0)
MCHC: 33.2 g/dL (ref 30.0–36.0)
MCV: 94.2 fL (ref 80.0–100.0)
Monocytes Absolute: 1.1 10*3/uL — ABNORMAL HIGH (ref 0.1–1.0)
Monocytes Relative: 10 %
Neutro Abs: 8.3 10*3/uL — ABNORMAL HIGH (ref 1.7–7.7)
Neutrophils Relative %: 76 %
Platelets: 250 10*3/uL (ref 150–400)
RBC: 3.77 MIL/uL — ABNORMAL LOW (ref 4.22–5.81)
RDW: 12.8 % (ref 11.5–15.5)
WBC: 11 10*3/uL — ABNORMAL HIGH (ref 4.0–10.5)
nRBC: 0 % (ref 0.0–0.2)

## 2021-04-30 LAB — EXPECTORATED SPUTUM ASSESSMENT W GRAM STAIN, RFLX TO RESP C

## 2021-04-30 LAB — LACTATE DEHYDROGENASE: LDH: 239 U/L — ABNORMAL HIGH (ref 98–192)

## 2021-04-30 MED ORDER — MIRTAZAPINE 15 MG PO TABS
15.0000 mg | ORAL_TABLET | Freq: Every evening | ORAL | 0 refills | Status: DC | PRN
  Filled 2021-04-30: qty 30, 30d supply, fill #0

## 2021-04-30 MED ORDER — SULFAMETHOXAZOLE-TRIMETHOPRIM 800-160 MG PO TABS
1.0000 | ORAL_TABLET | ORAL | 0 refills | Status: DC
  Filled 2021-04-30: qty 36, 84d supply, fill #0

## 2021-04-30 MED ORDER — BICTEGRAVIR-EMTRICITAB-TENOFOV 50-200-25 MG PO TABS
1.0000 | ORAL_TABLET | Freq: Every day | ORAL | 0 refills | Status: DC
  Filled 2021-04-30: qty 30, 30d supply, fill #0

## 2021-04-30 MED ORDER — OSELTAMIVIR PHOSPHATE 75 MG PO CAPS
75.0000 mg | ORAL_CAPSULE | Freq: Two times a day (BID) | ORAL | 0 refills | Status: AC
  Filled 2021-04-30: qty 4, 2d supply, fill #0

## 2021-04-30 MED ORDER — QUETIAPINE FUMARATE 100 MG PO TABS
100.0000 mg | ORAL_TABLET | Freq: Every evening | ORAL | 0 refills | Status: DC | PRN
  Filled 2021-04-30: qty 30, 30d supply, fill #0

## 2021-04-30 MED ORDER — ALBUTEROL SULFATE HFA 108 (90 BASE) MCG/ACT IN AERS: 2.0000 | INHALATION_SPRAY | RESPIRATORY_TRACT | 1 refills | Status: DC | PRN

## 2021-04-30 MED ORDER — BIKTARVY 50-200-25 MG PO TABS: 1.0000 | ORAL_TABLET | Freq: Every day | ORAL | 0 refills | Status: DC

## 2021-04-30 MED ORDER — ACETAMINOPHEN 325 MG PO TABS
650.0000 mg | ORAL_TABLET | Freq: Four times a day (QID) | ORAL | 0 refills | Status: DC | PRN
  Filled 2021-04-30: qty 30, 4d supply, fill #0

## 2021-04-30 MED ORDER — MOMETASONE FURO-FORMOTEROL FUM 100-5 MCG/ACT IN AERO
2.0000 | INHALATION_SPRAY | Freq: Two times a day (BID) | RESPIRATORY_TRACT | 0 refills | Status: DC
  Filled 2021-04-30: qty 13, 30d supply, fill #0

## 2021-04-30 MED ORDER — BICTEGRAVIR-EMTRICITAB-TENOFOV 50-200-25 MG PO TABS
1.0000 | ORAL_TABLET | Freq: Every day | ORAL | 0 refills | Status: AC
  Filled 2021-04-30: qty 90, 90d supply, fill #0

## 2021-04-30 MED ORDER — SULFAMETHOXAZOLE-TRIMETHOPRIM 800-160 MG PO TABS: 1.0000 | ORAL_TABLET | ORAL | Status: DC

## 2021-04-30 MED ORDER — PREDNISONE 20 MG PO TABS
40.0000 mg | ORAL_TABLET | Freq: Every day | ORAL | 0 refills | Status: AC
  Filled 2021-04-30: qty 6, 3d supply, fill #0

## 2021-04-30 MED ORDER — BUSPIRONE HCL 5 MG PO TABS
7.5000 mg | ORAL_TABLET | Freq: Two times a day (BID) | ORAL | 0 refills | Status: DC | PRN
  Filled 2021-04-30: qty 45, 15d supply, fill #0

## 2021-04-30 NOTE — Progress Notes (Signed)
Pt has active DC orders. Plan to pick up med from pharmacy upon discharge. Pt verbalized need for transportation and clothing. CM notified.  Ordered sputum culture sent to lab.

## 2021-04-30 NOTE — TOC Progression Note (Signed)
Transition of Care [facility]) - Progression Note    Patient Details  Name: John [name] MRN: [id number] Date of Birth: October 19, 1970  Transition of Care [facility]) CM/SW Contact  Jacalyn Lefevre Edson Snow[place], RN Phone Number: 04/30/2021, 1:40 PM  Clinical Narrative:     [facility] delivered medications to room.   TOC provided hoodie, pants and shoes.        Expected Discharge Plan and Services           Expected Discharge Date: 04/30/21                                     Social Determinants of Health (SDOH) Interventions    Readmission Risk Interventions No flowsheet data found.

## 2021-04-30 NOTE — Progress Notes (Addendum)
Discharge instructions and prescriptions given at this time. Pt has bus pass and remaining prescriptions at bedside.Told pt that home med needs to be picked up from pharmacy before DC. Pt is also awaiting transport for DC. Stable at baseline.

## 2021-04-30 NOTE — Progress Notes (Signed)
Medication Samples have been provided to the patient.  Drug name: Biktarvy        Strength: 50/200/25 mg       Qty: 7 tablets (1 bottle)   LOT: [id number]   Exp.Date: 12/14/22  Dosing instructions: Take one tablet by mouth once daily  The patient has been instructed regarding the correct time, dose, and frequency of taking this medication, including desired effects and most common side effects.   Riham Polyakov L. Eber Hong, PharmD, BCIDP, AAHIVP, CPP Clinical Pharmacist Practitioner Infectious Diseases [place] 02/25/2020, 10:07 AM

## 2021-05-01 ENCOUNTER — Other Ambulatory Visit: Payer: Self-pay | Admitting: Internal Medicine

## 2021-05-01 ENCOUNTER — Telehealth: Payer: Self-pay | Admitting: Internal Medicine

## 2021-05-01 MED ORDER — CEFDINIR 300 MG PO CAPS: 300.0000 mg | ORAL_CAPSULE | Freq: Two times a day (BID) | ORAL | 0 refills | Status: AC

## 2021-05-01 NOTE — Telephone Encounter (Signed)
TOC HFU  Name: John [name], John [name] MRN: [id number]  Date: 05/07/2021 Status: Sch  Time: 10:15 AM Length: 60  Visit Type: OPEN ESTABLISHED [726] Copay: $0.00  Provider: Virl Axe, MD

## 2021-05-01 NOTE — Progress Notes (Signed)
[name] was admitted for asthma exacerbation and influenza A. He was discharged once his respiratory status returned to baseline. During hospitalization, he was noted to develop a productive cough, greenish sputum that wasn't present on admission. Sputum culture was obtained and results reveal abundant gram positive cocci in pairs and in chains. Likely etiology is superimposed bacterial infection with Strep pneumoniae. There was a high suspicion for superimposed infection due to HIV status and abrupt productive mucopurulent cough. Patient was called and informed of sputum results. Cefdinir 300mg  BID for 5 days. Patient acknowledges and states he will pick up the prescription at his local pharmacy, [facility] on [address], [place], [place].

## 2021-05-02 LAB — FUNGITELL, SERUM: Fungitell Result: <31 pg/mL (ref <80)

## 2021-05-03 LAB — CULTURE, RESPIRATORY W GRAM STAIN

## 2021-05-05 LAB — PNEUMOCYSTIS PCR: Result Pneumocystis PCR: NEGATIVE

## 2021-05-07 ENCOUNTER — Inpatient Hospital Stay (HOSPITAL_COMMUNITY)
Admission: EM | Admit: 2021-05-07 | Discharge: 2021-05-09 | DRG: 974 | Disposition: A | Discharge status: Home / Self Care | Payer: Medicaid Other | Attending: Internal Medicine | Admitting: Internal Medicine

## 2021-05-07 ENCOUNTER — Emergency Department (HOSPITAL_COMMUNITY): Payer: Medicaid Other

## 2021-05-07 ENCOUNTER — Encounter (HOSPITAL_COMMUNITY): Payer: Self-pay

## 2021-05-07 ENCOUNTER — Other Ambulatory Visit: Payer: Self-pay

## 2021-05-07 ENCOUNTER — Encounter: Payer: Medicaid Other | Admitting: Student

## 2021-05-07 DIAGNOSIS — B20 Human immunodeficiency virus [HIV] disease: Secondary | ICD-10-CM | POA: Diagnosis present

## 2021-05-07 DIAGNOSIS — Z91138 Patient's unintentional underdosing of medication regimen for other reason: Secondary | ICD-10-CM | POA: Diagnosis not present

## 2021-05-07 DIAGNOSIS — Z91013 Allergy to seafood: Secondary | ICD-10-CM | POA: Diagnosis not present

## 2021-05-07 DIAGNOSIS — F17200 Nicotine dependence, unspecified, uncomplicated: Secondary | ICD-10-CM | POA: Diagnosis present

## 2021-05-07 DIAGNOSIS — F319 Bipolar disorder, unspecified: Secondary | ICD-10-CM | POA: Diagnosis present

## 2021-05-07 DIAGNOSIS — R0603 Acute respiratory distress: Secondary | ICD-10-CM | POA: Diagnosis present

## 2021-05-07 DIAGNOSIS — J4541 Moderate persistent asthma with (acute) exacerbation: Secondary | ICD-10-CM | POA: Diagnosis present

## 2021-05-07 DIAGNOSIS — J13 Pneumonia due to Streptococcus pneumoniae: Secondary | ICD-10-CM | POA: Diagnosis present

## 2021-05-07 DIAGNOSIS — J9601 Acute respiratory failure with hypoxia: Secondary | ICD-10-CM

## 2021-05-07 DIAGNOSIS — F1990 Other psychoactive substance use, unspecified, uncomplicated: Secondary | ICD-10-CM | POA: Diagnosis present

## 2021-05-07 DIAGNOSIS — F411 Generalized anxiety disorder: Secondary | ICD-10-CM | POA: Diagnosis present

## 2021-05-07 DIAGNOSIS — F1290 Cannabis use, unspecified, uncomplicated: Secondary | ICD-10-CM | POA: Diagnosis present

## 2021-05-07 DIAGNOSIS — F1910 Other psychoactive substance abuse, uncomplicated: Secondary | ICD-10-CM | POA: Diagnosis present

## 2021-05-07 DIAGNOSIS — Z79899 Other long term (current) drug therapy: Secondary | ICD-10-CM

## 2021-05-07 DIAGNOSIS — F17210 Nicotine dependence, cigarettes, uncomplicated: Secondary | ICD-10-CM | POA: Diagnosis present

## 2021-05-07 DIAGNOSIS — T361X6A Underdosing of cephalosporins and other beta-lactam antibiotics, initial encounter: Secondary | ICD-10-CM | POA: Diagnosis present

## 2021-05-07 DIAGNOSIS — Z20822 Contact with and (suspected) exposure to covid-19: Secondary | ICD-10-CM | POA: Diagnosis present

## 2021-05-07 DIAGNOSIS — J189 Pneumonia, unspecified organism: Secondary | ICD-10-CM

## 2021-05-07 DIAGNOSIS — I10 Essential (primary) hypertension: Secondary | ICD-10-CM | POA: Diagnosis present

## 2021-05-07 DIAGNOSIS — F1490 Cocaine use, unspecified, uncomplicated: Secondary | ICD-10-CM | POA: Diagnosis present

## 2021-05-07 HISTORY — DX: Acute respiratory failure with hypoxia: J96.01

## 2021-05-07 HISTORY — DX: Pneumonia, unspecified organism: J18.9

## 2021-05-07 LAB — CBC WITH DIFFERENTIAL/PLATELET
Abs Immature Granulocytes: 0.12 10*3/uL — ABNORMAL HIGH (ref 0.00–0.07)
Basophils Absolute: 0 10*3/uL (ref 0.0–0.1)
Basophils Relative: 0 %
Eosinophils Absolute: 0 10*3/uL (ref 0.0–0.5)
Eosinophils Relative: 0 %
HCT: 38.1 % — ABNORMAL LOW (ref 39.0–52.0)
Hemoglobin: 12.4 g/dL — ABNORMAL LOW (ref 13.0–17.0)
Immature Granulocytes: 1 %
Lymphocytes Relative: 12 %
Lymphs Abs: 1.8 10*3/uL (ref 0.7–4.0)
MCH: 31 pg (ref 26.0–34.0)
MCHC: 32.5 g/dL (ref 30.0–36.0)
MCV: 95.3 fL (ref 80.0–100.0)
Monocytes Absolute: 0.6 10*3/uL (ref 0.1–1.0)
Monocytes Relative: 4 %
Neutro Abs: 12.5 10*3/uL — ABNORMAL HIGH (ref 1.7–7.7)
Neutrophils Relative %: 83 %
Platelets: 448 10*3/uL — ABNORMAL HIGH (ref 150–400)
RBC: 4 MIL/uL — ABNORMAL LOW (ref 4.22–5.81)
RDW: 13 % (ref 11.5–15.5)
WBC: 15.1 10*3/uL — ABNORMAL HIGH (ref 4.0–10.5)
nRBC: 0 % (ref 0.0–0.2)

## 2021-05-07 LAB — COMPREHENSIVE METABOLIC PANEL
ALT: 18 U/L (ref 0–44)
AST: 16 U/L (ref 15–41)
Albumin: 2.2 g/dL — ABNORMAL LOW (ref 3.5–5.0)
Alkaline Phosphatase: 81 U/L (ref 38–126)
Anion gap: 9 (ref 5–15)
BUN: 9 mg/dL (ref 6–20)
CO2: 26 mmol/L (ref 22–32)
Calcium: 8.3 mg/dL — ABNORMAL LOW (ref 8.9–10.3)
Chloride: 99 mmol/L (ref 98–111)
Creatinine, Ser: 0.69 mg/dL (ref 0.61–1.24)
GFR, Estimated: >60 mL/min (ref >60)
Glucose, Bld: 159 mg/dL — ABNORMAL HIGH (ref 70–99)
Potassium: 3.5 mmol/L (ref 3.5–5.1)
Sodium: 134 mmol/L — ABNORMAL LOW (ref 135–145)
Total Bilirubin: 0.4 mg/dL (ref 0.3–1.2)
Total Protein: 6.4 g/dL — ABNORMAL LOW (ref 6.5–8.1)

## 2021-05-07 LAB — RESP PANEL BY RT-PCR (FLU A&B, COVID) ARPGX2
Influenza A by PCR: NEGATIVE
Influenza B by PCR: NEGATIVE
SARS Coronavirus 2 by RT PCR: NEGATIVE

## 2021-05-07 LAB — BRAIN NATRIURETIC PEPTIDE: B Natriuretic Peptide: 84.8 pg/mL (ref 0.0–100.0)

## 2021-05-07 MED ORDER — ALBUTEROL SULFATE (2.5 MG/3ML) 0.083% IN NEBU
2.5000 mg | INHALATION_SOLUTION | RESPIRATORY_TRACT | Status: DC | PRN
  Administered 2021-05-07 – 2021-05-08 (×2): 2.5 mg via RESPIRATORY_TRACT
  Filled 2021-05-07 (×2): qty 3

## 2021-05-07 MED ORDER — ALBUTEROL SULFATE (2.5 MG/3ML) 0.083% IN NEBU
INHALATION_SOLUTION | RESPIRATORY_TRACT | Status: AC
  Administered 2021-05-07: 10 mg
  Filled 2021-05-07: qty 12

## 2021-05-07 MED ORDER — BUSPIRONE HCL 15 MG PO TABS
7.5000 mg | ORAL_TABLET | Freq: Two times a day (BID) | ORAL | Status: DC | PRN
  Filled 2021-05-07: qty 1

## 2021-05-07 MED ORDER — PREDNISONE 20 MG PO TABS
40.0000 mg | ORAL_TABLET | Freq: Every day | ORAL | Status: DC
  Administered 2021-05-08 – 2021-05-09 (×2): 40 mg via ORAL
  Filled 2021-05-07 (×2): qty 2

## 2021-05-07 MED ORDER — BICTEGRAVIR-EMTRICITAB-TENOFOV 50-200-25 MG PO TABS
1.0000 | ORAL_TABLET | Freq: Every day | ORAL | Status: DC
  Administered 2021-05-07 – 2021-05-09 (×3): 1 via ORAL
  Filled 2021-05-07 (×3): qty 1

## 2021-05-07 MED ORDER — LACTATED RINGERS IV SOLN: INTRAVENOUS | Status: DC

## 2021-05-07 MED ORDER — ACETAMINOPHEN 325 MG PO TABS: 650.0000 mg | ORAL_TABLET | Freq: Four times a day (QID) | ORAL | Status: DC | PRN

## 2021-05-07 MED ORDER — METHYLPREDNISOLONE SODIUM SUCC 125 MG IJ SOLR
125.0000 mg | Freq: Once | INTRAMUSCULAR | Status: AC
  Administered 2021-05-07: 125 mg via INTRAVENOUS
  Filled 2021-05-07: qty 2

## 2021-05-07 MED ORDER — VANCOMYCIN HCL 750 MG/150ML IV SOLN
750.0000 mg | Freq: Two times a day (BID) | INTRAVENOUS | Status: DC
  Administered 2021-05-08 (×2): 750 mg via INTRAVENOUS
  Filled 2021-05-07 (×2): qty 150

## 2021-05-07 MED ORDER — VANCOMYCIN HCL IN DEXTROSE 1-5 GM/200ML-% IV SOLN: 1000.0000 mg | Freq: Once | INTRAVENOUS | Status: DC

## 2021-05-07 MED ORDER — SODIUM CHLORIDE 0.9 % IV SOLN
2.0000 g | Freq: Once | INTRAVENOUS | Status: AC
  Administered 2021-05-07: 2 g via INTRAVENOUS
  Filled 2021-05-07: qty 2

## 2021-05-07 MED ORDER — VANCOMYCIN HCL 750 MG/150ML IV SOLN: 750.0000 mg | Freq: Two times a day (BID) | INTRAVENOUS | Status: DC

## 2021-05-07 MED ORDER — MOMETASONE FURO-FORMOTEROL FUM 100-5 MCG/ACT IN AERO
2.0000 | INHALATION_SPRAY | Freq: Two times a day (BID) | RESPIRATORY_TRACT | Status: DC
  Administered 2021-05-08 – 2021-05-09 (×2): 2 via RESPIRATORY_TRACT
  Filled 2021-05-07 (×2): qty 8.8

## 2021-05-07 MED ORDER — SODIUM CHLORIDE 0.9 % IV SOLN: 2.0000 g | Freq: Two times a day (BID) | INTRAVENOUS | Status: DC

## 2021-05-07 MED ORDER — ALBUTEROL SULFATE (2.5 MG/3ML) 0.083% IN NEBU: 10.0000 mg/h | INHALATION_SOLUTION | RESPIRATORY_TRACT | Status: DC

## 2021-05-07 MED ORDER — MIRTAZAPINE 15 MG PO TABS
15.0000 mg | ORAL_TABLET | Freq: Every evening | ORAL | Status: DC | PRN
  Administered 2021-05-07: 15 mg via ORAL
  Filled 2021-05-07 (×2): qty 1

## 2021-05-07 MED ORDER — ALBUTEROL SULFATE HFA 108 (90 BASE) MCG/ACT IN AERS: 2.0000 | INHALATION_SPRAY | RESPIRATORY_TRACT | Status: DC | PRN

## 2021-05-07 MED ORDER — VANCOMYCIN HCL 1250 MG/250ML IV SOLN
1250.0000 mg | Freq: Once | INTRAVENOUS | Status: AC
  Administered 2021-05-07: 1250 mg via INTRAVENOUS
  Filled 2021-05-07: qty 250

## 2021-05-07 MED ORDER — ENOXAPARIN SODIUM 40 MG/0.4ML IJ SOSY
40.0000 mg | PREFILLED_SYRINGE | INTRAMUSCULAR | Status: DC
  Administered 2021-05-07 – 2021-05-09 (×3): 40 mg via SUBCUTANEOUS
  Filled 2021-05-07 (×3): qty 0.4

## 2021-05-07 MED ORDER — ALBUTEROL (5 MG/ML) CONTINUOUS INHALATION SOLN
10.0000 mg/h | INHALATION_SOLUTION | RESPIRATORY_TRACT | Status: DC
  Filled 2021-05-07: qty 0.5

## 2021-05-07 MED ORDER — QUETIAPINE FUMARATE 50 MG PO TABS
100.0000 mg | ORAL_TABLET | Freq: Every evening | ORAL | Status: DC | PRN
  Administered 2021-05-07: 100 mg via ORAL
  Filled 2021-05-07: qty 2
  Filled 2021-05-07: qty 1

## 2021-05-07 MED ORDER — ACETAMINOPHEN 650 MG RE SUPP: 650.0000 mg | Freq: Four times a day (QID) | RECTAL | Status: DC | PRN

## 2021-05-07 MED ORDER — SODIUM CHLORIDE 0.9 % IV SOLN
2.0000 g | Freq: Three times a day (TID) | INTRAVENOUS | Status: DC
  Administered 2021-05-07 – 2021-05-08 (×3): 2 g via INTRAVENOUS
  Filled 2021-05-07 (×3): qty 2

## 2021-05-07 NOTE — ED Notes (Signed)
Resp less labored, states he feels better and is hungry. Snacks given

## 2021-05-07 NOTE — Progress Notes (Signed)
Patient had peak expiratory flow measured x3 with best of 200 L/min. This is significantly reduced for 51 yo male that is 165 cm tall (expected ~ 436 L/min). Therefore, will continue treatment for acute asthma exacerbation.   Acute asthma exacerbation: - Start prednisone 40 mg daily tomorrow - Cont Albuterol nebs - Consider starting chronic asthma medications prior to discharge.    Lawerance Cruel, D.O.  Internal Medicine Resident, PGY-3 [facility]  Pager: [phone] 9:44 PM, 05/07/2021   **Please contact the on call pager after 5 pm and on weekends at [phone].**

## 2021-05-07 NOTE — ED Notes (Signed)
Got patient into a gown on the monitor did ekg patient is resting with nurse at bedside

## 2021-05-07 NOTE — Progress Notes (Signed)
Peak flow performed x3. Best of 3 was 200.

## 2021-05-07 NOTE — ED Provider Notes (Signed)
[facility] DEPARTMENT Provider Note   CSN: [id number] Arrival date & time: 05/07/21  6433     History  Chief Complaint  Patient presents with   Respiratory Distress    Sudden onset of shortness of breath prior to arrival    Jackey Housey is a 51 y.o. male.  HPI Patient arrives via EMS 1 week after discharge following presentation for respiratory complaints with diagnosis of influenza.  History is obtained by EMS, chart review and the patient.  He notes that since discharge he has been unable to obtain his medication, has been progressively more short of breath.  He has less cough, however. He is unaware of fever. No focal pain, no syncope.  EMS reports the patient was hypoxic, 84% on room air.  He does not wear home oxygen.  In route the patient received albuterol, Atrovent, with mild improvement in his condition.    Home Medications Prior to Admission medications   Medication Sig Start Date End Date Taking? Authorizing Provider  acetaminophen (TYLENOL) 325 MG tablet Take 2 tablets (650 mg total) by mouth every 6 (six) hours as needed for mild pain (or Fever >/= 101). 04/30/21   Timothy Lasso, MD  albuterol (VENTOLIN HFA) 108 (90 Base) MCG/ACT inhaler Inhale 2 puffs into the lungs every 4 (four) hours as needed for wheezing or shortness of breath. 04/30/21   Timothy Lasso, MD  bictegravir-emtricitabine-tenofovir AF (BIKTARVY) 50-200-25 MG TABS tablet Take 1 tablet by mouth daily. 05/01/21 07/30/21  Timothy Lasso, MD  busPIRone (BUSPAR) 5 MG tablet Take 1.5 tablets (7.5 mg total) by mouth 2 (two) times daily as needed (anxiety). 04/30/21   Timothy Lasso, MD  mirtazapine (REMERON) 15 MG tablet Take 1 tablet (15 mg total) by mouth at bedtime as needed (sleep). 04/30/21 05/30/21  Timothy Lasso, MD  mometasone-formoterol (DULERA) 100-5 MCG/ACT AERO Inhale 2 puffs into the lungs 2 (two) times daily. 04/30/21   Timothy Lasso, MD  QUEtiapine (SEROQUEL)  100 MG tablet Take 1 tablet (100 mg total) by mouth at bedtime as needed (sleep). 04/30/21 05/30/21  Timothy Lasso, MD  sulfamethoxazole-trimethoprim (BACTRIM DS) 800-160 MG tablet Take 1 tablet by mouth 3 (three) times a week. 05/04/21 08/02/21  Timothy Lasso, MD  dicyclomine (BENTYL) 20 MG tablet Take 1 tablet (20 mg total) by mouth 2 (two) times daily. 01/08/17 01/03/20  Palumbo, April, MD  sucralfate (CARAFATE) 1 GM/10ML suspension Take 10 mLs (1 g total) by mouth 4 (four) times daily -  with meals and at bedtime. Patient not taking: Reported on 10/21/2017 01/08/17 09/03/18  Palumbo, April, MD      Allergies    Shellfish allergy    Review of Systems   Review of Systems  Constitutional:        Per HPI, otherwise negative  HENT:         Per HPI, otherwise negative  Respiratory:         Per HPI, otherwise negative  Cardiovascular:        Per HPI, otherwise negative  Gastrointestinal:  Negative for vomiting.  Endocrine:       Negative aside from HPI  Genitourinary:        Neg aside from HPI   Musculoskeletal:        Per HPI, otherwise negative  Skin: Negative.   Allergic/Immunologic: Positive for immunocompromised state.  Neurological:  Negative for syncope.   Physical Exam Updated Vital Signs BP 122/79    Pulse (!) 108  Temp 98.9 F (37.2 C) (Oral)    Resp (!) 28    Ht 5\' 5"  (1.651 m)    Wt 55.3 kg    SpO2 91%    BMI 20.30 kg/m  Physical Exam Vitals and nursing note reviewed.  Constitutional:      General: He is in acute distress.     Appearance: He is well-developed. He is ill-appearing and diaphoretic.  HENT:     Head: Normocephalic and atraumatic.  Eyes:     Conjunctiva/sclera: Conjunctivae normal.  Cardiovascular:     Rate and Rhythm: Regular rhythm. Tachycardia present.  Pulmonary:     Effort: Respiratory distress present.     Breath sounds: No stridor.  Abdominal:     General: There is no distension.  Skin:    General: Skin is warm.  Neurological:      Mental Status: He is alert and oriented to person, place, and time.    ED Results / Procedures / Treatments   Labs (all labs ordered are listed, but only abnormal results are displayed) Labs Reviewed  COMPREHENSIVE METABOLIC PANEL - Abnormal; Notable for the following components:      Result Value   Sodium 134 (*)    Glucose, Bld 159 (*)    Calcium 8.3 (*)    Total Protein 6.4 (*)    Albumin 2.2 (*)    All other components within normal limits  CBC WITH DIFFERENTIAL/PLATELET - Abnormal; Notable for the following components:   WBC 15.1 (*)    RBC 4.00 (*)    Hemoglobin 12.4 (*)    HCT 38.1 (*)    Platelets 448 (*)    Neutro Abs 12.5 (*)    Abs Immature Granulocytes 0.12 (*)    All other components within normal limits  RESP PANEL BY RT-PCR (FLU A&B, COVID) ARPGX2  CULTURE, BLOOD (SINGLE)  BRAIN NATRIURETIC PEPTIDE    EKG EKG Interpretation  Date/Time:  Thursday May 07 2021 09:39:48 EST Ventricular Rate:  119 PR Interval:  113 QRS Duration: 79 QT Interval:  306 QTC Calculation: 427 R Axis:   48 Text Interpretation: Sinus tachycardia Multiform ventricular premature complexes Right atrial enlargement Borderline T abnormalities, lateral leads Abnormal ECG Confirmed by Carmin Muskrat [phone]) on 05/07/2021 9:49:59 AM  Radiology DG Chest Port 1 View  Result Date: 05/07/2021 CLINICAL DATA:  SOB EXAM: PORTABLE CHEST 1 VIEW COMPARISON:  February 15 23. FINDINGS: More widespread patchy interstitial and airspace opacities in the right greater than left mid and basilar lungs. No visible pleural effusions or pneumothorax. Cardiomediastinal silhouette is within normal limits and similar to prior. IMPRESSION: More widespread patchy interstitial and airspace opacities in the right greater than left mid and basilar lungs, concerning for worsening multifocal pneumonia. Followup PA and lateral chest X-ray is recommended in 3-4 weeks following trial of antibiotic therapy to ensure  resolution and exclude underlying malignancy. Electronically Signed   By: Margaretha Sheffield M.D.   On: 05/07/2021 09:23    Procedures Procedures    Medications Ordered in ED Medications  albuterol (PROVENTIL) (2.5 MG/3ML) 0.083% nebulizer solution (10 mg Nebulization Given 05/07/21 0843)  lactated ringers infusion (has no administration in time range)  vancomycin (VANCOCIN) IVPB 1000 mg/200 mL premix (has no administration in time range)  ceFEPIme (MAXIPIME) 2 g in sodium chloride 0.9 % 100 mL IVPB (has no administration in time range)  methylPREDNISolone sodium succinate (SOLU-MEDROL) 125 mg/2 mL injection 125 mg (125 mg Intravenous Given 05/07/21 0852)    ED  Course/ Medical Decision Making/ A&P  This patient presents to the ED for concern of respiratory distress, this involves an extensive number of treatment options, and is a complaint that carries with it a high risk of complications and morbidity.  The differential diagnosis includes pneumonia, worsening influenza, sepsis, Sirs, atypical ACS   Co morbidities that complicate the patient evaluation  HIV, asthma   Social Determinants of Health:  Inability to obtain his medication, HIV   Additional history obtained:  Additional history and/or information obtained from [place], chart External records from outside source obtained and reviewed including discharge summary from last week with positive influenza admission, discharged with ongoing steroids which she has not obtained   After the initial evaluation, orders, including: Supplemental oxygen, bronchodilators, steroids were initiated.  Patient placed on Cardiac and Pulse-Oximetry Monitors. The patient was maintained on a cardiac monitor.  The cardiac monitored showed an rhythm of sinus tach, 110, abnormal The patient was also maintained on pulse oximetry. The readings were typically 99% with supplemental oxygen abnormal  On repeat evaluation of the patient improved  Lab  Tests:  I personally interpreted labs.  The pertinent results include: Bilateral infiltrates concerning for pneumonia  Imaging Studies ordered:  I independently visualized and interpreted imaging which showed pneumonia, as above I agree with the radiologist interpretation  Consultations Obtained:  I requested consultation with the internal medicine teaching service, his primary care team,  and discussed lab and imaging findings as well as pertinent plan - they recommend: Admission  Dispostion / Final MDM:  After consideration of the diagnostic results and the patient's response to treatment, patient received broad-spectrum antibiotics for healthcare acquired pneumonia, ongoing fluid resuscitation, bronchodilators, steroids.  After initially presenting for respiratory distress, this adult male with HIV, immunocompromise, recent admission for pneumonia, inability to obtain his medications was presenting with signs and symptoms concerning for pneumonia.  Patient required resuscitation, as above with broad-spectrum antibiotics, fluids, bronchodilators, admission.  Final Clinical Impression(s) / ED Diagnoses Final diagnoses:  HCAP (healthcare-associated pneumonia)  Respiratory distress   CRITICAL CARE Performed by: Carmin Muskrat Total critical care time: 35 minutes Critical care time was exclusive of separately billable procedures and treating other patients. Critical care was necessary to treat or prevent imminent or life-threatening deterioration. Critical care was time spent personally by me on the following activities: development of treatment plan with patient and/or surrogate as well as nursing, discussions with consultants, evaluation of patient's response to treatment, examination of patient, obtaining history from patient or surrogate, ordering and performing treatments and interventions, ordering and review of laboratory studies, ordering and review of radiographic studies, pulse  oximetry and re-evaluation of patient's condition.    Carmin Muskrat, MD 05/07/21 [phone]

## 2021-05-07 NOTE — Progress Notes (Signed)
Pharmacy Antibiotic Note  John [name] is a 51 y.o. male admitted on 05/07/2021 with pneumonia.  Patient had a recent admission on 02/13 due to an asthma exacerbation, found to be positive for influenza A , and was discharged last week. Respiratory cultures from this previous admission grew Streptococcus pneumoniae. Patient was discharged with antibiotics to treat, but there is concern that he was not taking those antibiotics. Patient readmitted 2/23 with concern for HAP from previous admission. Pharmacy has been consulted for vancomycin and cefepime dosing.  Of note, patient has a history of HIV with medication non-compliance and should take Bactrim daily for PJP/toxoplasmosis prophylaxis. WBC 15.1, afebrile, Scr 0.69.   Plan: - Cefepime 2g IV q8h given CrCl>60 - Vancomycin 1,250 mg IV x1  - Vancomycin 750mg  q12h (eAUC:443 - Serum creatine used 0.8)   -Follow up MRSA PCR swab  -Follow up blood cultures / respiratory cultures -Trend fever, WBCs, and potential for antibiotic de-escalation.  Height: 5\' 5"  (165.1 cm) Weight: 55.3 kg (122 lb) IBW/kg (Calculated) : 61.5  Temp (24hrs), Avg:98.9 F (37.2 C), Min:98.9 F (37.2 C), Max:98.9 F (37.2 C)  Recent Labs  Lab 05/07/21 0840  WBC 15.1*  CREATININE 0.69    Estimated Creatinine Clearance: 87.4 mL/min (by C-G formula based on SCr of 0.69 mg/dL).    Allergies  Allergen Reactions   Shellfish Allergy Anaphylaxis and Swelling    Antimicrobials this admission: Cefepime 2/23>> Vancomycin 2/23>>  Dose adjustments this admission: N/A  Microbiology results: 2/16 Sputum cx: Abundant Streptococcus pneumoniae  2/23 MRSA PCR: sent  2/23 blood cultures: in process  COVID negative, FLU negative    Thank you for allowing pharmacy to be a part of this patients care.  [place] - Student Pharmacist 05/07/2021 11:00 AM

## 2021-05-07 NOTE — H&P (Addendum)
Date: 05/07/2021               Patient Name:  John [name] MRN: [id number]  DOB: 09/04/1970 Age / Sex: 51 y.o., male   PCP: Pcp, No         Medical Service: Internal Medicine Teaching Service         Attending Physician: Dr. Lucious Groves, DO    First Contact: Dr. Jeanice Lim Pager: [phone]  Second Contact: Dr. Johnney Ou Pager: [phone]       After Hours (After 5p/  First Contact Pager: [phone]  weekends / holidays): Second Contact Pager: [phone]   Chief Complaint: shortness of breath  History of Present Illness: John [name] is a 51 year old male with history of asthma, anxiety, HIV, depression, hypertension, alcohol use disorder, cocaine use, and bipolar disorder presenting with worsening shortness of breath. He was recently hospitalized from 2/13 to 2/16 with an asthma exacerbation and found to have influenza A.  He improved with treatment of his asthma exacerbation and influenza A with Tamiflu.  He was restarted on ART along with PJP prophylaxis.  When he was discharged home he was saturating well on room air.  After he was discharged home his respiratory sputum culture came back positive for strep pneumo.  Patient was sent a 5-day course of cefdinir but states he was unable to pick up his medications.  Reports that he is only been using his albuterol inhaler as he was unable to go to the pharmacy due to his shortness of breath.  States that he has transportation issues.  States that his breathing worsened 1 day after he was discharged from the hospital.  He describes persistent shortness of breath, orthopnea, productive cough with grayish/green phlegm.  Denies fevers but does endorse chills and diaphoresis.  States that his husband is now sick with the flu, no other sick contacts.  Denies any chest pain, nausea, vomiting, swelling, headaches or changes in his vision.  He has never required home oxygen.  Meds:  Current Meds  Medication Sig   acetaminophen (TYLENOL) 325 MG  tablet Take 2 tablets (650 mg total) by mouth every 6 (six) hours as needed for mild pain (or Fever >/= 101).   albuterol (VENTOLIN HFA) 108 (90 Base) MCG/ACT inhaler Inhale 2 puffs into the lungs every 4 (four) hours as needed for wheezing or shortness of breath.   bictegravir-emtricitabine-tenofovir AF (BIKTARVY) 50-200-25 MG TABS tablet Take 1 tablet by mouth daily.   busPIRone (BUSPAR) 5 MG tablet Take 1.5 tablets (7.5 mg total) by mouth 2 (two) times daily as needed (anxiety).   mirtazapine (REMERON) 15 MG tablet Take 1 tablet (15 mg total) by mouth at bedtime as needed (sleep).   mometasone-formoterol (DULERA) 100-5 MCG/ACT AERO Inhale 2 puffs into the lungs 2 (two) times daily.   QUEtiapine (SEROQUEL) 100 MG tablet Take 1 tablet (100 mg total) by mouth at bedtime as needed (sleep).   sulfamethoxazole-trimethoprim (BACTRIM DS) 800-160 MG tablet Take 1 tablet by mouth 3 (three) times a week. (Patient taking differently: Take 1 tablet by mouth 3 (three) times a week. [facility] [facility] & Saturday)     Allergies: Allergies as of 05/07/2021 - Review Complete 05/07/2021  Allergen Reaction Noted   Shellfish allergy Anaphylaxis and Swelling 03/14/2011   Past Medical History:  Diagnosis Date   Alcohol abuse    Alcoholism ([place])    Anxiety    Asthma    Asthma    Bipolar disorder ([place])  Chronic low back pain    Cocaine abuse (H[facility])    Depression    Gout    Gout    HIV (human immunodeficiency virus infection) ([place])    "dx'd ~ 2 yr ago" (09/29/2012)   HIV (human immunodeficiency virus infection) ([place])    HIV disease ([place])    Homelessness    Hypertension    Marijuana abuse    Mental disorder    Schizophrenia ([place])     Family History:  Family History  Problem Relation Age of Onset   Alcoholism Mother    Depression Mother    Alcoholism Brother      Social History: Lives at home with his husband.  Able to complete all his ADLs and IADLs himself.  Recently has had trouble ambulating due  to his shortness of breath but at baseline has no difficulty with this.  Endorses smoking a quarter of a pack of cigarettes a day.  Previously smoked half a pack a day.  Has been smoking since the age of 3.  Endorses daily marijuana use since the age of 13.  Also endorses cocaine use, most recently last Thursday.  Denies any IV drug use.  Drinks on average 1 beer a week.  No history of alcohol withdrawal.  Review of Systems: A complete ROS was negative except as per HPI.   Physical Exam: Blood pressure 117/87, pulse (!) 55, temperature 98.9 F (37.2 C), temperature source Oral, resp. rate (!) 30, height 5\' 5"  (1.651 m), weight 55.3 kg, SpO2 92 %. Physical Exam General: alert, appears stated age, breathing uncomfortably HEENT: Normocephalic, atraumatic, EOM intact, conjunctiva normal CV: Tachycardia, regular rhythm, no murmurs rubs or gallops Pulm: Increased work of breathing on 2 L supplemental oxygen, significant bilateral rhonchi and expiratory wheezing, no use of accessory muscles Abdomen: Soft, nondistended, bowel sounds present, no tenderness to palpation MSK: No lower extremity edema Skin: Warm and dry Neuro: Alert and oriented x3   EKG: personally reviewed my interpretation is sinus tachycardia  CXR: personally reviewed my interpretation is worsening multifocal airspace disease, right greater than left  Assessment & Plan by Problem: Principal Problem:   Acute hypoxemic respiratory failure (HCC) Active Problems:   Human immunodeficiency virus (HIV) disease (HCC)   Tobacco use disorder   Moderate persistent asthma with acute exacerbation   Polysubstance abuse ([place])   CAP (community acquired pneumonia)  Acute hypoxic respiratory failure secondary to pneumonia Acute asthma exacerbation Recent influenza A infection Patient presents with worsening respiratory distress and tachypnea requiring 3L supplemental oxygen.  Also endorsing productive cough with green/grayish phlegm.   Chest x-ray shows worsening multifocal pneumonia.  Patient was recently hospitalized with an asthma exacerbation and influenza A.  Sputum culture from this admission was positive for strep pneumo.  Patient was unable to obtain oral antibiotic prescription.  I do not suspect that he has a hospital-acquired pneumonia and that this was a previously acquired CAP.  Started on cefepime and vancomycin in the ED.  If MRSA swab is negative can discontinue vancomycin.  Recently treated with steroids for acute asthma exacerbation and also given 1 dose of Solu-Medrol in the ER.  Suspect respiratory failure is likely secondary to pneumonia and not continued asthma exacerbation; although there is significant wheezing on examination.  Will reassess patient's wheezing to determine whether or not to continue steroids at this time. -Continue supplemental oxygen O2 saturation goal greater than 92% -Continue IV cefepime and vancomycin -Follow-up MRSA PCR swab -Follow-up blood cultures -Trend CBC and  fever curve -DuoNebs every 6 hours as needed -Continue albuterol -Given 1 dose of Solu-Medrol in the ER  Sinus tachycardia Suspect this is secondary to acute hypoxic respiratory failure.  Wells score 1.5, low suspicion for PE.  -Continue to monitor  HIV Most recent CD4 count was 91, viral load undetectable.  In the setting of acute infection with his CD4 percent within normal limits and viral load undetectable CD4 count was thought to be falsely lower than expected per infectious disease.  Patient was restarted on Biktarvy (switched from [place] due to interaction with his inhalers) and PJP prophylaxis with Bactrim.  He was unable to obtain his prescriptions after discharge.  Will consult social work to help with future transportation issues. -Continue Biktarvy -Continue Bactrim at discharge  Bipolar disorder Major depression with psychotic features  Generalized anxiety disorder -Continue Seroquel 100 mg, mirtazapine  15 mg and BuSpar 7.5 mg twice daily  Polysubstance use disorder Patient endorses daily cigarette and marijuana use as well as frequent cocaine use most recent last Thursday.  Encouraged cessation of illicit substances as well as tobacco use.  Patient denies any IV drug use. -Continue to encourage cessation  Diet: Regular VTE prophylaxis: Lovenox Full code Dispo: Admit patient to Inpatient with expected length of stay greater than 2 midnights.  SignedMike Craze, DO 05/07/2021, 12:51 PM  Pager: [phone] After 5pm on weekdays and 1pm on weekends: On Call pager: [phone]

## 2021-05-08 ENCOUNTER — Other Ambulatory Visit (HOSPITAL_COMMUNITY): Payer: Self-pay

## 2021-05-08 LAB — CBC WITH DIFFERENTIAL/PLATELET
Abs Immature Granulocytes: 0.14 10*3/uL — ABNORMAL HIGH (ref 0.00–0.07)
Basophils Absolute: 0 10*3/uL (ref 0.0–0.1)
Basophils Relative: 0 %
Eosinophils Absolute: 0 10*3/uL (ref 0.0–0.5)
Eosinophils Relative: 0 %
HCT: 37.8 % — ABNORMAL LOW (ref 39.0–52.0)
Hemoglobin: 12.5 g/dL — ABNORMAL LOW (ref 13.0–17.0)
Immature Granulocytes: 1 %
Lymphocytes Relative: 9 %
Lymphs Abs: 1.3 10*3/uL (ref 0.7–4.0)
MCH: 31.3 pg (ref 26.0–34.0)
MCHC: 33.1 g/dL (ref 30.0–36.0)
MCV: 94.7 fL (ref 80.0–100.0)
Monocytes Absolute: 0.7 10*3/uL (ref 0.1–1.0)
Monocytes Relative: 4 %
Neutro Abs: 13.3 10*3/uL — ABNORMAL HIGH (ref 1.7–7.7)
Neutrophils Relative %: 86 %
Platelets: 443 10*3/uL — ABNORMAL HIGH (ref 150–400)
RBC: 3.99 MIL/uL — ABNORMAL LOW (ref 4.22–5.81)
RDW: 13 % (ref 11.5–15.5)
WBC: 15.5 10*3/uL — ABNORMAL HIGH (ref 4.0–10.5)
nRBC: 0 % (ref 0.0–0.2)

## 2021-05-08 LAB — BASIC METABOLIC PANEL
Anion gap: 14 (ref 5–15)
BUN: 17 mg/dL (ref 6–20)
CO2: 23 mmol/L (ref 22–32)
Calcium: 8.5 mg/dL — ABNORMAL LOW (ref 8.9–10.3)
Chloride: 101 mmol/L (ref 98–111)
Creatinine, Ser: 0.91 mg/dL (ref 0.61–1.24)
GFR, Estimated: >60 mL/min (ref >60)
Glucose, Bld: 140 mg/dL — ABNORMAL HIGH (ref 70–99)
Potassium: 3.7 mmol/L (ref 3.5–5.1)
Sodium: 138 mmol/L (ref 135–145)

## 2021-05-08 LAB — MRSA NEXT GEN BY PCR, NASAL: MRSA by PCR Next Gen: NOT DETECTED

## 2021-05-08 MED ORDER — IOHEXOL 9 MG/ML PO SOLN
ORAL | Status: AC
  Filled 2021-05-08: qty 1000

## 2021-05-08 MED ORDER — SODIUM CHLORIDE 0.9 % IV SOLN: 2.0000 g | INTRAVENOUS | Status: DC

## 2021-05-08 MED ORDER — PREDNISONE 10 MG PO TABS
ORAL_TABLET | ORAL | 0 refills | Status: AC
  Filled 2021-05-08: qty 21, 9d supply, fill #0

## 2021-05-08 MED ORDER — QUETIAPINE FUMARATE 50 MG PO TABS
100.0000 mg | ORAL_TABLET | Freq: Every evening | ORAL | Status: DC | PRN
  Administered 2021-05-08: 100 mg via ORAL
  Filled 2021-05-08: qty 2

## 2021-05-08 MED ORDER — SODIUM CHLORIDE 0.9 % IV SOLN
1.0000 g | INTRAVENOUS | Status: DC
  Administered 2021-05-08: 1 g via INTRAVENOUS
  Filled 2021-05-08 (×2): qty 10

## 2021-05-08 MED ORDER — SULFAMETHOXAZOLE-TRIMETHOPRIM 800-160 MG PO TABS
1.0000 | ORAL_TABLET | Freq: Every day | ORAL | Status: DC
  Administered 2021-05-08: 1 via ORAL
  Filled 2021-05-08: qty 1

## 2021-05-08 MED ORDER — SULFAMETHOXAZOLE-TRIMETHOPRIM 800-160 MG PO TABS
1.0000 | ORAL_TABLET | ORAL | 0 refills | Status: AC
  Filled 2021-05-08: qty 12, 28d supply, fill #0

## 2021-05-08 MED ORDER — DULERA 100-5 MCG/ACT IN AERO
2.0000 | INHALATION_SPRAY | Freq: Two times a day (BID) | RESPIRATORY_TRACT | 0 refills | Status: DC
  Filled 2021-05-08: qty 13, 30d supply, fill #0

## 2021-05-08 MED ORDER — SULFAMETHOXAZOLE-TRIMETHOPRIM 800-160 MG PO TABS: 1.0000 | ORAL_TABLET | ORAL | Status: DC

## 2021-05-08 MED ORDER — MIRTAZAPINE 15 MG PO TABS
15.0000 mg | ORAL_TABLET | Freq: Every evening | ORAL | Status: DC | PRN
  Administered 2021-05-08: 15 mg via ORAL
  Filled 2021-05-08: qty 1

## 2021-05-08 MED ORDER — CEFDINIR 300 MG PO CAPS
300.0000 mg | ORAL_CAPSULE | Freq: Two times a day (BID) | ORAL | 0 refills | Status: AC
  Filled 2021-05-08: qty 8, 4d supply, fill #0

## 2021-05-08 MED ORDER — CEFTRIAXONE SODIUM 2 G IJ SOLR: 2.0000 g | INTRAMUSCULAR | Status: DC

## 2021-05-08 NOTE — Progress Notes (Signed)
Pharmacy Antibiotic Note  John [name] is a 51 y.o. male admitted on 05/07/2021 with strep penumo pneumonia in the setting of HIV, recent influenza A, and asthma exacerbation.  Pharmacy has been consulted for Bactrim dosing for PJP prophylaxis. Also consulted for vancomycin and cefepime.  ClCr 76 ml/min. K wnl.   Plan: Bactrim DS 1 tablet daily for PJP prophylaxis Cefepime 2g Q 8 hr Vancomycin 750mg  Q 12 hr Monitor MRSA swab, clinical status, renal function, vancomycin level Narrow abx as able and f/u duration    Height: 5\' 5"  (165.1 cm) Weight: 55.3 kg (122 lb) IBW/kg (Calculated) : 61.5  Temp (24hrs), Avg:98.4 F (36.9 C), Min:97.8 F (36.6 C), Max:99 F (37.2 C)  Recent Labs  Lab 05/07/21 0840 05/08/21 0038  WBC 15.1* 15.5*  CREATININE 0.69 0.91    Estimated Creatinine Clearance: 76.8 mL/min (by C-G formula based on SCr of 0.91 mg/dL).    Allergies  Allergen Reactions   Shellfish Allergy Anaphylaxis and Swelling    Antimicrobials this admission: Cefepime 2/23>> Vancomycin 2/23>> Bactrim for PJP ppx   Dose adjustments this admission: N/a  Microbiology results: 2/23 BCx: sent  2/23 MRSA PCR: sent 2/16 Sputum: abundant strep pneumo   Thank you for allowing pharmacy to be a part of this patients care.  Benetta Spar, PharmD, BCPS, BCCP Clinical Pharmacist  Please check AMION for all [facility] phone numbers After 10:00 PM, call [place] [phone]

## 2021-05-08 NOTE — Progress Notes (Signed)
° °  Subjective: I seen and evaluated John [name] at bedside.  He was sitting up in bed and eating lunch.  He states that his breathing has improved.  He states that when he was discharged previously, he was unable to pick up his antibiotics which resulted in him getting sick.  He states since admission, he overall feels a lot better.  He did however endorse, night sweats.  No fever or chills.  Objective:  Vital signs in last 24 hours: Vitals:   05/08/21 0411 05/08/21 0811 05/08/21 0839 05/08/21 0903  BP: 118/71 (!) 153/91    Pulse: 73 (!) 108    Resp: 20 19    Temp: 98 F (36.7 C) 97.8 F (36.6 C)    TempSrc:      SpO2: 97% 95% 99% 94%  Weight:      Height:       Physical Exam Constitutional:      General: He is awake. He is not in acute distress. Cardiovascular:     Rate and Rhythm: Normal rate and regular rhythm.     Heart sounds: Normal heart sounds.  Pulmonary:     Breath sounds: Normal air entry. Wheezing present.     Comments: Increased work of breathing noted. Musculoskeletal:     Right lower leg: No edema.     Left lower leg: No edema.  Skin:    General: Skin is warm and dry.  Neurological:     Mental Status: He is alert.  Psychiatric:        Behavior: Behavior is cooperative.     Assessment/Plan:  Principal Problem:   Acute hypoxemic respiratory failure (HCC) Active Problems:   Human immunodeficiency virus (HIV) disease (HCC)   Tobacco use disorder   Moderate persistent asthma with acute exacerbation   Polysubstance abuse ([place])   CAP (community acquired pneumonia)   Acute asthma exacerbation 2/2 untreated CAP Patient recently presented with similar presentation. At time of discharge patient was instructed to pick up antibiotic therapy (cefdinir) for CAP (strep pneumoniae), confirmed by positive sputum clx. Unfortunately, patient was unable to obtain medication, resulting in worsening of symptoms. Pt was initiated on cefepime and vancomycin in the ED. Will  switch to ceftriaxone for CAP coverage. MRSA PCR negative;  --Discontinue cefapime and vancomycin --Start IV ceftriaxone --Dulera 2 puff BID --Prednisone 40mg  daily; will begin taper at time of DC --Albuterol nebs Q4H PRN  Untreated CAP As stated above; patient was unable to obtain antibiotic therapy on prior discharge for diagnosis of CAP. Antibiutoc therapy continued on this admission --ceftriaxone 2g daily  HIV Most recent CD4 count was 93, viral load undetectable.  Patient states he has been without medication since prior admission due to not picking it up from pharmacy, he endorsed transportation issues.  Patient was recently switched from [place] to [place] on last admission, due to [place] interaction with inhalers. --Continue Biktarvy daily --Continue Bactrim prophylaxis  Bipolar disorder Major depression with psychotic features  Generalized anxiety disorder -Seroquel 100 mg --mirtazapine 15 mg  --BuSpar 7.5 mg twice daily  Diet: Regular VTE prophylaxis: Lovenox Full code   Prior to Admission Living Arrangement: Anticipated Discharge Location: Barriers to Discharge: Dispo: Anticipated discharge in approximately 1-2 day(s).   John Lasso, MD 05/08/2021, 12:17 PM Pager: [phone] After 5pm on weekdays and 1pm on weekends: On Call pager [phone]

## 2021-05-08 NOTE — TOC Progression Note (Signed)
Transition of Care [facility]) - Progression Note    Patient Details  Name: John [name] MRN: [id number] Date of Birth: Mar 24, 1970  Transition of Care [facility]) CM/SW Contact  Jacalyn Lefevre Edson Snowball, RN Phone Number: 05/08/2021, 3:28 PM  Clinical Narrative:     Consult for PCP. Spoke to patient confirmed patient has medicaid . Medicaid assigns a PCP, however patient does not have a copy of his card.    [facility].   [place] first available appointment is June 10, 2021 at 0930 . Patient is on wait list if they have a cancellation they will call him.    Prescriptions with Medicaid are around $3 per prescription.     Expected Discharge Plan and Services                                                 Social Determinants of Health (SDOH) Interventions    Readmission Risk Interventions No flowsheet data found.

## 2021-05-08 NOTE — TOC Progression Note (Signed)
Transition of Care [facility]) - Progression Note    Patient Details  Name: John [name] MRN: [id number] Date of Birth: 1970-05-26  Transition of Care [facility]) CM/SW Contact  Narmeen Kerper, Edson Snowball, RN Phone Number: 05/08/2021, 11:13 AM  Clinical Narrative:      Transition of Care [facility]) Screening Note   Patient Details  Name: John [name] Date of Birth: 10/16/1970   Transition of Care Department [facility]) has reviewed patient and no TOC needs have been identified at this time. We will continue to monitor patient advancement through interdisciplinary progression rounds. If new patient transition needs arise, please place a TOC consult.         Expected Discharge Plan and Services                                                 Social Determinants of Health (SDOH) Interventions    Readmission Risk Interventions No flowsheet data found.

## 2021-05-09 LAB — CBC WITH DIFFERENTIAL/PLATELET
Abs Immature Granulocytes: 0.32 10*3/uL — ABNORMAL HIGH (ref 0.00–0.07)
Basophils Absolute: 0.1 10*3/uL (ref 0.0–0.1)
Basophils Relative: 0 %
Eosinophils Absolute: 0.1 10*3/uL (ref 0.0–0.5)
Eosinophils Relative: 0 %
HCT: 33.9 % — ABNORMAL LOW (ref 39.0–52.0)
Hemoglobin: 11.2 g/dL — ABNORMAL LOW (ref 13.0–17.0)
Immature Granulocytes: 2 %
Lymphocytes Relative: 14 %
Lymphs Abs: 2.3 10*3/uL (ref 0.7–4.0)
MCH: 31.6 pg (ref 26.0–34.0)
MCHC: 33 g/dL (ref 30.0–36.0)
MCV: 95.8 fL (ref 80.0–100.0)
Monocytes Absolute: 0.7 10*3/uL (ref 0.1–1.0)
Monocytes Relative: 4 %
Neutro Abs: 13.3 10*3/uL — ABNORMAL HIGH (ref 1.7–7.7)
Neutrophils Relative %: 80 %
Platelets: 463 10*3/uL — ABNORMAL HIGH (ref 150–400)
RBC: 3.54 MIL/uL — ABNORMAL LOW (ref 4.22–5.81)
RDW: 13 % (ref 11.5–15.5)
WBC: 16.7 10*3/uL — ABNORMAL HIGH (ref 4.0–10.5)
nRBC: 0 % (ref 0.0–0.2)

## 2021-05-09 LAB — BASIC METABOLIC PANEL
Anion gap: 6 (ref 5–15)
BUN: 23 mg/dL — ABNORMAL HIGH (ref 6–20)
CO2: 26 mmol/L (ref 22–32)
Calcium: 8.5 mg/dL — ABNORMAL LOW (ref 8.9–10.3)
Chloride: 106 mmol/L (ref 98–111)
Creatinine, Ser: 0.75 mg/dL (ref 0.61–1.24)
GFR, Estimated: >60 mL/min (ref >60)
Glucose, Bld: 81 mg/dL (ref 70–99)
Potassium: 4.1 mmol/L (ref 3.5–5.1)
Sodium: 138 mmol/L (ref 135–145)

## 2021-05-09 NOTE — Discharge Summary (Signed)
Name: John [name] MRN: [id number] DOB: 1970/05/22 51 y.o. PCP: Pcp, No  Date of Admission: 05/07/2021  8:11 AM Date of Discharge: 05/09/21 Attending Physician: Lucious Groves, DO  Discharge Diagnosis: 1. Principal Problem:   Acute hypoxemic respiratory failure (HCC) Active Problems:   Human immunodeficiency virus (HIV) disease (HCC)   Moderate persistent asthma with acute exacerbation   CAP (community acquired pneumonia)    Discharge Medications: Allergies as of 05/09/2021       Reactions   Shellfish Allergy Anaphylaxis, Swelling        Medication List     TAKE these medications    acetaminophen 325 MG tablet Commonly known as: TYLENOL Take 2 tablets (650 mg total) by mouth every 6 (six) hours as needed for mild pain (or Fever >/= 101).   albuterol 108 (90 Base) MCG/ACT inhaler Commonly known as: VENTOLIN HFA Inhale 2 puffs into the lungs every 4 (four) hours as needed for wheezing or shortness of breath.   bictegravir-emtricitabine-tenofovir AF 50-200-25 MG Tabs tablet Commonly known as: BIKTARVY Take 1 tablet by mouth daily.   busPIRone 5 MG tablet Commonly known as: BUSPAR Take 1.5 tablets (7.5 mg total) by mouth 2 (two) times daily as needed (anxiety).   cefdinir 300 MG capsule Commonly known as: OMNICEF Take 1 capsule (300 mg total) by mouth 2 (two) times daily for 4 days.   Dulera 100-5 MCG/ACT Aero Generic drug: mometasone-formoterol Inhale 2 puffs into the lungs 2 (two) times daily.   mirtazapine 15 MG tablet Commonly known as: REMERON Take 1 tablet (15 mg total) by mouth at bedtime as needed (sleep).   predniSONE 10 MG tablet Commonly known as: DELTASONE Take 4 tablets (40 mg total) by mouth daily for 3 days, THEN 2 tablets (20 mg total) daily for 3 days, THEN 1 tablet (10 mg total) daily for 3 days. Start taking on: May 10, 2021   QUEtiapine 100 MG tablet Commonly known as: SEROQUEL Take 1 tablet (100 mg total) by mouth at bedtime  as needed (sleep).   sulfamethoxazole-trimethoprim 800-160 MG tablet Commonly known as: BACTRIM DS Take 1 tablet by mouth 3 (three) times a week. What changed: additional instructions        Disposition and follow-up:   [name] was discharged from [facility] in Stable condition.  At the hospital follow up visit please address:  1.  Follow up with PCP- ensure medication compliance- complete antibiotic (cefdinir) and prednisone taper; Repeat CXR in 4 weeks for resolution of pneumonia.  2.  Labs / imaging needed at time of follow-up: CXR  3.  Pending labs/ test needing follow-up: None   Follow-up Appointments:  Follow-up [place] Follow up.   Why: [place]   June 10, 2021 at 0930 am Contact information: [place] [address] [phone]        [facility]. Schedule an appointment as soon as possible for a visit in 1 week(s).   Contact information: [address] [place] [facility] Course by problem list: 1. Acute hypoxic respiratory failure: Patient presented to the ED complaining of worsening shortness of breath.  Requiring 4 L of nasal cannula to maintain saturations greater than 95%.  Hypoxemia likely in the setting of untreated pneumonia infection resulting  in acute asthma exacerbation.  Once patient received breathing treatments, antibiotics and prednisone, patient no longer required oxygen.  Satting well on room air at time of discharge. 2.  Moderate persistent asthma with acute exacerbation: Patient was previously admitted for similar presentation.  When discharged from previous admission, patient was unable to obtain prescription inhalers and antibiotics for pneumonia.  Patient developed worsening shortness of breath and wheezing noted on exam.  Peak  flow performed 200 L/min; expected for patient's age, height, gender, and ethnicity is 460 L/min.  Patient received albuterol nebs, prednisone and antibiotics for underlying pneumonia.  Patient's respiratory status continued to improve.  On exam, patient was saturating well on room air, no accessory muscles usage, some wheezing auscultated on lung auscultation, but much improved since admission. Repeat peak flow unchanged though patient was stable and respiratory status at baseline at time of discharge. 3.  Community-acquired pneumonia: Patient was recently admitted for similar presentation, at that time he was found to be influenza A positive.  Sputum culture was obtained during that admission, sputum culture grew Streptococcus pneumoniae.  Patient was prescribed cefdinir at that time, and was unable to obtain prescription.  Chest x-ray on that admission showed multifocal opacities.  Patient presented with worsening shortness of breath, productive cough and night sweats.  Patient was started on broad-spectrum coverage in the ED vancomycin and cefepime per guidelines for HCAP/HAP.  However patient did not need this expanded coverage, organism has already been identified, patient just was untreated due to unable to obtain medication.  Antibiotics were de-escalated to ceftriaxone.  Repeat chest x-ray revealed multifocal opacities unchanged from previous.  At time of discharge, patient received prescriptions at bedside, including cefdinir, prednisone taper, and inhalers. 4.  HIV: On prior admission, CD4 count was 91, viral load undetectable.  Patient was started on PJP prophylaxis, Bactrim.  Patient was previously on [place] and was switched to [place] due to [employer] interaction with inhalers.  On this admission, patient stated he was unable to obtain prescription and has been without medication.  During hospitalization, patient was restarted on Biktarvy and Bactrim.  All other chronic conditions were managed  appropriately.  Discharge Exam:   BP 132/84 (BP Location: Right Arm)    Pulse 91    Temp 98 F (36.7 C) (Oral)    Resp 18    Ht 5\' 5"  (1.651 m)    Wt 55.3 kg    SpO2 98%    BMI 20.30 kg/m  Discharge exam: Physical Exam Constitutional:      General: He is awake. He is not in acute distress. Cardiovascular:     Rate and Rhythm: Normal rate and regular rhythm.     Heart sounds: Normal heart sounds.  Pulmonary:     Effort: Pulmonary effort is normal.     Breath sounds: Normal air entry. Wheezing present.  Musculoskeletal:     Right lower leg: No edema.     Left lower leg: No edema.  Skin:    General: Skin is warm and dry.  Neurological:     Mental Status: He is alert.  Psychiatric:        Behavior: Behavior is cooperative.     Pertinent Labs, Studies, and Procedures:  CBC Latest Ref Rng & Units 05/09/2021 05/08/2021 05/07/2021  WBC 4.0 - 10.5 K/uL 16.7(H) 15.5(H) 15.1(H)  Hemoglobin 13.0 - 17.0 g/dL 11.2(L) 12.5(L) 12.4(L)  Hematocrit 39.0 - 52.0 % 33.9(L) 37.8(L) 38.1(L)  Platelets 150 - 400 K/uL 463(H) 443(H) 448(H)  BMP Latest Ref Rng & Units 05/09/2021 05/08/2021 05/07/2021  Glucose 70 - 99 mg/dL 81 140(H) 159(H)  BUN 6 - 20 mg/dL 23(H) 17 9  Creatinine 0.61 - 1.24 mg/dL 0.75 0.91 0.69  BUN/Creat Ratio 6 - 22 (calc) - - -  Sodium 135 - 145 mmol/L 138 138 134(L)  Potassium 3.5 - 5.1 mmol/L 4.1 3.7 3.5  Chloride 98 - 111 mmol/L 106 101 99  CO2 22 - 32 mmol/L 26 23 26   Calcium 8.9 - 10.3 mg/dL 8.5(L) 8.5(L) 8.3(L)   DG Chest Port 1 View  Result Date: 05/07/2021 CLINICAL DATA:  SOB EXAM: PORTABLE CHEST 1 VIEW COMPARISON:  February 15 23. FINDINGS: More widespread patchy interstitial and airspace opacities in the right greater than left mid and basilar lungs. No visible pleural effusions or pneumothorax. Cardiomediastinal silhouette is within normal limits and similar to prior. IMPRESSION: More widespread patchy interstitial and airspace opacities in the right greater than left  mid and basilar lungs, concerning for worsening multifocal pneumonia. Followup PA and lateral chest X-ray is recommended in 3-4 weeks following trial of antibiotic therapy to ensure resolution and exclude underlying malignancy. Electronically Signed   By: Margaretha Sheffield M.D.   On: 05/07/2021 09:23   DG CHEST PORT 1 VIEW  Result Date: 04/29/2021 CLINICAL DATA:  Shortness of breath, asthma exacerbation. EXAM: PORTABLE CHEST 1 VIEW COMPARISON:  Chest x-ray 02/30/2023, CT chest 11/13/2014 FINDINGS: The heart and mediastinal contours are within normal limits. Interval development of right perihilar airspace opacity and patchy airspace opacity within left lower lung zone. No pulmonary edema. No pleural effusion. No pneumothorax. No acute osseous abnormality.  Old healed left rib fractures. IMPRESSION: Interval development of right perihilar airspace opacity and patchy airspace opacity within left lower lung zone. Findings suggestive of multifocal pneumonia. Followup PA and lateral chest X-ray is recommended in 3-4 weeks following therapy to ensure resolution. Electronically Signed   By: Iven Finn M.D.   On: 04/29/2021 20:18   DG Chest Port 1 View  Result Date: 04/27/2021 CLINICAL DATA:  Shortness of breath EXAM: PORTABLE CHEST 1 VIEW COMPARISON:  04/21/2021 FINDINGS: The heart size and mediastinal contours are within normal limits. Both lungs are clear. The visualized skeletal structures are unremarkable. IMPRESSION: No acute abnormality of the lungs in AP portable projection. Electronically Signed   By: Delanna Ahmadi M.D.   On: 04/27/2021 09:12   DG Chest Port 1 View  Result Date: 04/21/2021 CLINICAL DATA:  Shortness of breath EXAM: PORTABLE CHEST 1 VIEW COMPARISON:  Previous studies including the examination of 04/08/2021 FINDINGS: Cardiac size is within normal limits. There are no signs of pulmonary edema or focal pulmonary consolidation. Low position of diaphragms suggests COPD. There is no pleural  effusion or pneumothorax. IMPRESSION: There are no new infiltrates or signs of pulmonary edema. Electronically Signed   By: Elmer Picker M.D.   On: 04/21/2021 11:42     Discharge Instructions: Discharge Instructions     Call MD for:  difficulty breathing, headache or visual disturbances   Complete by: As directed    Call MD for:  extreme fatigue   Complete by: As directed    Call MD for:  hives   Complete by: As directed    Call MD for:  persistant dizziness or light-headedness   Complete by: As directed    Call MD for:  persistant nausea and vomiting   Complete by: As directed    Call MD for:  redness, tenderness, or signs  of infection (pain, swelling, redness, odor or green/yellow discharge around incision site)   Complete by: As directed    Call MD for:  severe uncontrolled pain   Complete by: As directed    Call MD for:  temperature >100.4   Complete by: As directed    Diet - low sodium heart healthy   Complete by: As directed    Increase activity slowly   Complete by: As directed        Signed: Timothy Lasso, MD 05/09/2021, 11:30 AM   Pager: [phone]

## 2021-05-09 NOTE — TOC CM/SW Note (Signed)
Patient states that he has no family or friends to provide transport home today. Taxi voucher provided.   [address] LCSW Transition of Care [phone]

## 2021-05-09 NOTE — Progress Notes (Signed)
Peak Flow 200 lpm x 3.    Per pt, he feels he is breathing at his normal baseline and denies SOB.

## 2021-05-09 NOTE — Plan of Care (Signed)
Pt ready for DC to home, taxi voucher obtained and given from [place] given to pt, antibiotic and prednisone and explained to pt. DC instructions given and explained and all follow up appointments reviewed with pt.

## 2021-05-09 NOTE — Discharge Instructions (Signed)
Please continue taking the antibiotics and prednisone as directed on the bottle.  Please follow up in the Internal medicine clinic here at [facility].

## 2021-05-12 ENCOUNTER — Telehealth: Payer: Self-pay

## 2021-05-12 ENCOUNTER — Ambulatory Visit: Payer: Medicaid Other | Admitting: Family

## 2021-05-12 LAB — CULTURE, BLOOD (SINGLE)
Culture: NO GROWTH
Special Requests: ADEQUATE

## 2021-05-12 NOTE — Telephone Encounter (Signed)
TOC HFU appointment scheduled for 05/21/2021 at 9:15 am with Dr. Howie Ill.  Unable to reach patient via telephone to confirm.  Appointment card mailed to current address on file.

## 2021-05-12 NOTE — Telephone Encounter (Signed)
-----   Message from Timothy Lasso, MD sent at 05/09/2021  1:05 PM EST ----- Regarding: Hospital follow up Hello,   This patient has been discharged from hospital and does not have a PCP. He will need a HFU and establish care. Could you please schedule a follow up appt in one week.     Thank you!

## 2021-05-21 ENCOUNTER — Ambulatory Visit: Payer: Medicaid Other | Admitting: Internal Medicine

## 2021-05-21 ENCOUNTER — Encounter: Payer: Self-pay | Admitting: Internal Medicine

## 2021-05-21 NOTE — Progress Notes (Deleted)
HFU new pt ? ?76 ED visits in last yr for asthma/ mental health concerns, close follow-up? ? ?Asthma exacerbation 2/13-2/16 ?Was not able to get inhalers or abx following discharge ? ?HAP 2/23-2/25 ?Required 4L to maintain sats ?-cefdinir (finished course on 2/29) and pred taper ?-repeat CXR end of March ? ?Asthma ?Medications: Mometasone-formoterol Ruthe Mannan), albuterol ?A/P: ? ?HIV ?CD4 91 with viral load undectectable ?Started on PJP prop w bactrim ?Restarted on biktarvy in hospital ?[place] with [facility] 3/20 ? ?Bipolar/ MDD ?Get plugged in with social work and Dr. Theodis Shove ?Medications: ? ? ?Hx of housing instability ? ?

## 2021-06-01 ENCOUNTER — Telehealth: Payer: Self-pay

## 2021-06-01 ENCOUNTER — Ambulatory Visit: Payer: Medicaid Other | Admitting: Family

## 2021-06-01 NOTE — Telephone Encounter (Signed)
Called patient to reschedule missed appointment this morning. No answer. Left HIPAA-compliant voicemail requesting call back. ? ?Binnie Kand, RN  ?

## 2021-06-02 ENCOUNTER — Ambulatory Visit: Payer: Medicaid Other | Admitting: Internal Medicine

## 2021-06-02 NOTE — Progress Notes (Deleted)
New ?2 admissions for CAP, he did not finish abx course ?Pneumonia ?A/P: ?CXR ? ? ?Asthma  ?A/P: ? ?HIV ?CD4 count at 91 during 2/13 admission. He was started on PJP prophylaxis with bactrim. He was restarted on Biktarvy at that time as well. ?A/P: ?

## 2021-06-10 ENCOUNTER — Inpatient Hospital Stay: Payer: Medicaid Other | Admitting: Family Medicine

## 2021-06-30 ENCOUNTER — Telehealth (HOSPITAL_COMMUNITY): Payer: Medicaid Other | Admitting: Physician Assistant

## 2021-08-07 ENCOUNTER — Encounter (HOSPITAL_COMMUNITY): Payer: Self-pay | Admitting: Emergency Medicine

## 2021-08-07 ENCOUNTER — Observation Stay (HOSPITAL_COMMUNITY): Payer: Medicaid Other

## 2021-08-07 ENCOUNTER — Emergency Department (HOSPITAL_COMMUNITY): Payer: Medicaid Other

## 2021-08-07 ENCOUNTER — Inpatient Hospital Stay (HOSPITAL_COMMUNITY)
Admission: EM | Admit: 2021-08-07 | Discharge: 2021-08-08 | DRG: 191 | Disposition: A | Discharge status: Home / Self Care | Payer: Medicaid Other | Attending: Family Medicine | Admitting: Family Medicine

## 2021-08-07 ENCOUNTER — Other Ambulatory Visit: Payer: Self-pay

## 2021-08-07 DIAGNOSIS — F3164 Bipolar disorder, current episode mixed, severe, with psychotic features: Secondary | ICD-10-CM

## 2021-08-07 DIAGNOSIS — F1910 Other psychoactive substance abuse, uncomplicated: Secondary | ICD-10-CM | POA: Diagnosis present

## 2021-08-07 DIAGNOSIS — F17200 Nicotine dependence, unspecified, uncomplicated: Secondary | ICD-10-CM | POA: Diagnosis present

## 2021-08-07 DIAGNOSIS — B20 Human immunodeficiency virus [HIV] disease: Secondary | ICD-10-CM | POA: Diagnosis present

## 2021-08-07 DIAGNOSIS — J4541 Moderate persistent asthma with (acute) exacerbation: Secondary | ICD-10-CM

## 2021-08-07 DIAGNOSIS — Z5900 Homelessness unspecified: Secondary | ICD-10-CM

## 2021-08-07 DIAGNOSIS — J45901 Unspecified asthma with (acute) exacerbation: Secondary | ICD-10-CM | POA: Diagnosis present

## 2021-08-07 DIAGNOSIS — F17210 Nicotine dependence, cigarettes, uncomplicated: Secondary | ICD-10-CM | POA: Diagnosis present

## 2021-08-07 DIAGNOSIS — F5105 Insomnia due to other mental disorder: Secondary | ICD-10-CM

## 2021-08-07 DIAGNOSIS — M25559 Pain in unspecified hip: Secondary | ICD-10-CM | POA: Diagnosis not present

## 2021-08-07 DIAGNOSIS — Z20822 Contact with and (suspected) exposure to covid-19: Secondary | ICD-10-CM | POA: Diagnosis present

## 2021-08-07 DIAGNOSIS — F1590 Other stimulant use, unspecified, uncomplicated: Secondary | ICD-10-CM | POA: Diagnosis present

## 2021-08-07 DIAGNOSIS — J441 Chronic obstructive pulmonary disease with (acute) exacerbation: Principal | ICD-10-CM | POA: Diagnosis present

## 2021-08-07 DIAGNOSIS — F411 Generalized anxiety disorder: Secondary | ICD-10-CM

## 2021-08-07 DIAGNOSIS — F1420 Cocaine dependence, uncomplicated: Secondary | ICD-10-CM | POA: Diagnosis present

## 2021-08-07 DIAGNOSIS — F315 Bipolar disorder, current episode depressed, severe, with psychotic features: Secondary | ICD-10-CM | POA: Diagnosis present

## 2021-08-07 DIAGNOSIS — M25552 Pain in left hip: Secondary | ICD-10-CM | POA: Diagnosis not present

## 2021-08-07 DIAGNOSIS — F1220 Cannabis dependence, uncomplicated: Secondary | ICD-10-CM | POA: Diagnosis present

## 2021-08-07 DIAGNOSIS — Z79899 Other long term (current) drug therapy: Secondary | ICD-10-CM

## 2021-08-07 DIAGNOSIS — Z91013 Allergy to seafood: Secondary | ICD-10-CM

## 2021-08-07 DIAGNOSIS — F4310 Post-traumatic stress disorder, unspecified: Secondary | ICD-10-CM

## 2021-08-07 LAB — RAPID URINE DRUG SCREEN, HOSP PERFORMED
Amphetamines: NOT DETECTED
Barbiturates: NOT DETECTED
Benzodiazepines: NOT DETECTED
Cocaine: POSITIVE — AB
Opiates: NOT DETECTED
Tetrahydrocannabinol: POSITIVE — AB

## 2021-08-07 LAB — CBC WITH DIFFERENTIAL/PLATELET
Abs Immature Granulocytes: 0.01 10*3/uL (ref 0.00–0.07)
Basophils Absolute: 0 10*3/uL (ref 0.0–0.1)
Basophils Relative: 1 %
Eosinophils Absolute: 0.2 10*3/uL (ref 0.0–0.5)
Eosinophils Relative: 4 %
HCT: 43.5 % (ref 39.0–52.0)
Hemoglobin: 14.1 g/dL (ref 13.0–17.0)
Immature Granulocytes: 0 %
Lymphocytes Relative: 53 %
Lymphs Abs: 2.8 10*3/uL (ref 0.7–4.0)
MCH: 30.8 pg (ref 26.0–34.0)
MCHC: 32.4 g/dL (ref 30.0–36.0)
MCV: 95 fL (ref 80.0–100.0)
Monocytes Absolute: 0.6 10*3/uL (ref 0.1–1.0)
Monocytes Relative: 11 %
Neutro Abs: 1.7 10*3/uL (ref 1.7–7.7)
Neutrophils Relative %: 31 %
Platelets: 375 10*3/uL (ref 150–400)
RBC: 4.58 MIL/uL (ref 4.22–5.81)
RDW: 12.5 % (ref 11.5–15.5)
WBC: 5.4 10*3/uL (ref 4.0–10.5)
nRBC: 0 % (ref 0.0–0.2)

## 2021-08-07 LAB — I-STAT CHEM 8, ED
BUN: 17 mg/dL (ref 6–20)
Calcium, Ion: 1 mmol/L — ABNORMAL LOW (ref 1.15–1.40)
Chloride: 106 mmol/L (ref 98–111)
Creatinine, Ser: 1 mg/dL (ref 0.61–1.24)
Glucose, Bld: 72 mg/dL (ref 70–99)
HCT: 43 % (ref 39.0–52.0)
Hemoglobin: 14.6 g/dL (ref 13.0–17.0)
Potassium: 4.5 mmol/L (ref 3.5–5.1)
Sodium: 141 mmol/L (ref 135–145)
TCO2: 28 mmol/L (ref 22–32)

## 2021-08-07 LAB — TROPONIN I (HIGH SENSITIVITY)
Troponin I (High Sensitivity): 4 ng/L (ref <18)
Troponin I (High Sensitivity): 4 ng/L (ref <18)

## 2021-08-07 LAB — SARS CORONAVIRUS 2 BY RT PCR: SARS Coronavirus 2 by RT PCR: NEGATIVE

## 2021-08-07 MED ORDER — POLYETHYLENE GLYCOL 3350 17 G PO PACK: 17.0000 g | PACK | Freq: Every day | ORAL | Status: DC | PRN

## 2021-08-07 MED ORDER — AZITHROMYCIN 500 MG PO TABS
500.0000 mg | ORAL_TABLET | Freq: Every day | ORAL | Status: DC
  Administered 2021-08-08: 500 mg via ORAL
  Filled 2021-08-07: qty 1

## 2021-08-07 MED ORDER — QUETIAPINE FUMARATE 100 MG PO TABS
100.0000 mg | ORAL_TABLET | Freq: Every day | ORAL | Status: DC
Start: 2021-08-07 — End: 2021-08-08
  Administered 2021-08-07: 100 mg via ORAL
  Filled 2021-08-07: qty 1

## 2021-08-07 MED ORDER — DOCUSATE SODIUM 100 MG PO CAPS
100.0000 mg | ORAL_CAPSULE | Freq: Two times a day (BID) | ORAL | Status: DC
  Administered 2021-08-07 – 2021-08-08 (×3): 100 mg via ORAL
  Filled 2021-08-07 (×3): qty 1

## 2021-08-07 MED ORDER — BICTEGRAVIR-EMTRICITAB-TENOFOV 50-200-25 MG PO TABS
1.0000 | ORAL_TABLET | Freq: Every day | ORAL | Status: DC
  Administered 2021-08-07 – 2021-08-08 (×2): 1 via ORAL
  Filled 2021-08-07 (×2): qty 1

## 2021-08-07 MED ORDER — ACETAMINOPHEN 325 MG PO TABS: 650.0000 mg | ORAL_TABLET | Freq: Four times a day (QID) | ORAL | Status: DC | PRN

## 2021-08-07 MED ORDER — ENOXAPARIN SODIUM 40 MG/0.4ML IJ SOSY
40.0000 mg | PREFILLED_SYRINGE | INTRAMUSCULAR | Status: DC
  Administered 2021-08-07 – 2021-08-08 (×2): 40 mg via SUBCUTANEOUS
  Filled 2021-08-07 (×2): qty 0.4

## 2021-08-07 MED ORDER — METHOCARBAMOL 1000 MG/10ML IJ SOLN: 500.0000 mg | Freq: Four times a day (QID) | INTRAVENOUS | Status: DC | PRN

## 2021-08-07 MED ORDER — LORATADINE 10 MG PO TABS
10.0000 mg | ORAL_TABLET | Freq: Once | ORAL | Status: AC
  Administered 2021-08-07: 10 mg via ORAL
  Filled 2021-08-07: qty 1

## 2021-08-07 MED ORDER — IPRATROPIUM-ALBUTEROL 0.5-2.5 (3) MG/3ML IN SOLN
3.0000 mL | Freq: Four times a day (QID) | RESPIRATORY_TRACT | Status: DC
  Administered 2021-08-07: 3 mL via RESPIRATORY_TRACT
  Filled 2021-08-07: qty 3

## 2021-08-07 MED ORDER — ENSURE ENLIVE PO LIQD
237.0000 mL | Freq: Two times a day (BID) | ORAL | Status: DC
  Administered 2021-08-08: 237 mL via ORAL

## 2021-08-07 MED ORDER — NICOTINE 14 MG/24HR TD PT24
14.0000 mg | MEDICATED_PATCH | Freq: Every day | TRANSDERMAL | Status: DC
  Administered 2021-08-07 – 2021-08-08 (×2): 14 mg via TRANSDERMAL
  Filled 2021-08-07 (×2): qty 1

## 2021-08-07 MED ORDER — METHYLPREDNISOLONE SODIUM SUCC 125 MG IJ SOLR
60.0000 mg | Freq: Two times a day (BID) | INTRAMUSCULAR | Status: AC
  Administered 2021-08-07: 60 mg via INTRAVENOUS
  Filled 2021-08-07: qty 2

## 2021-08-07 MED ORDER — MAGNESIUM SULFATE 2 GM/50ML IV SOLN
2.0000 g | Freq: Once | INTRAVENOUS | Status: AC
  Administered 2021-08-07: 2 g via INTRAVENOUS
  Filled 2021-08-07: qty 50

## 2021-08-07 MED ORDER — PREDNISONE 20 MG PO TABS
40.0000 mg | ORAL_TABLET | Freq: Every day | ORAL | Status: DC
  Administered 2021-08-08: 40 mg via ORAL
  Filled 2021-08-07: qty 2

## 2021-08-07 MED ORDER — METHYLPREDNISOLONE SODIUM SUCC 125 MG IJ SOLR
125.0000 mg | INTRAMUSCULAR | Status: AC
  Administered 2021-08-07: 125 mg via INTRAVENOUS
  Filled 2021-08-07: qty 2

## 2021-08-07 MED ORDER — ADULT MULTIVITAMIN W/MINERALS CH
1.0000 | ORAL_TABLET | Freq: Every day | ORAL | Status: DC
  Administered 2021-08-07: 1 via ORAL
  Filled 2021-08-07 (×2): qty 1

## 2021-08-07 MED ORDER — MOMETASONE FURO-FORMOTEROL FUM 100-5 MCG/ACT IN AERO
2.0000 | INHALATION_SPRAY | Freq: Two times a day (BID) | RESPIRATORY_TRACT | Status: DC
  Administered 2021-08-07 – 2021-08-08 (×3): 2 via RESPIRATORY_TRACT
  Filled 2021-08-07: qty 8.8

## 2021-08-07 MED ORDER — ALBUTEROL SULFATE (2.5 MG/3ML) 0.083% IN NEBU: 2.5000 mg | INHALATION_SOLUTION | RESPIRATORY_TRACT | Status: DC | PRN

## 2021-08-07 MED ORDER — ALBUTEROL SULFATE (2.5 MG/3ML) 0.083% IN NEBU
5.0000 mg | INHALATION_SOLUTION | Freq: Once | RESPIRATORY_TRACT | Status: AC
  Administered 2021-08-07: 5 mg via RESPIRATORY_TRACT
  Filled 2021-08-07: qty 6

## 2021-08-07 MED ORDER — ACETAMINOPHEN 650 MG RE SUPP: 650.0000 mg | Freq: Four times a day (QID) | RECTAL | Status: DC | PRN

## 2021-08-07 MED ORDER — SODIUM CHLORIDE 0.9% FLUSH
3.0000 mL | Freq: Two times a day (BID) | INTRAVENOUS | Status: DC
  Administered 2021-08-07 (×2): 3 mL via INTRAVENOUS

## 2021-08-07 MED ORDER — GUAIFENESIN ER 600 MG PO TB12: 600.0000 mg | ORAL_TABLET | Freq: Two times a day (BID) | ORAL | Status: DC | PRN

## 2021-08-07 MED ORDER — BISACODYL 5 MG PO TBEC: 5.0000 mg | DELAYED_RELEASE_TABLET | Freq: Every day | ORAL | Status: DC | PRN

## 2021-08-07 MED ORDER — BUSPIRONE HCL 15 MG PO TABS: 7.5000 mg | ORAL_TABLET | Freq: Two times a day (BID) | ORAL | Status: DC | PRN

## 2021-08-07 MED ORDER — SODIUM CHLORIDE 0.9 % IV SOLN
500.0000 mg | INTRAVENOUS | Status: AC
  Administered 2021-08-07: 500 mg via INTRAVENOUS
  Filled 2021-08-07: qty 5

## 2021-08-07 MED ORDER — ONDANSETRON HCL 4 MG PO TABS: 4.0000 mg | ORAL_TABLET | Freq: Four times a day (QID) | ORAL | Status: DC | PRN

## 2021-08-07 MED ORDER — BOOST / RESOURCE BREEZE PO LIQD CUSTOM
1.0000 | Freq: Two times a day (BID) | ORAL | Status: DC
  Administered 2021-08-07 – 2021-08-08 (×3): 1 via ORAL

## 2021-08-07 MED ORDER — QUETIAPINE FUMARATE 100 MG PO TABS: 100.0000 mg | ORAL_TABLET | Freq: Every evening | ORAL | Status: DC | PRN

## 2021-08-07 MED ORDER — OXYCODONE HCL 5 MG PO TABS: 5.0000 mg | ORAL_TABLET | ORAL | Status: DC | PRN

## 2021-08-07 MED ORDER — HYDRALAZINE HCL 20 MG/ML IJ SOLN: 5.0000 mg | INTRAMUSCULAR | Status: DC | PRN

## 2021-08-07 MED ORDER — ONDANSETRON HCL 4 MG/2ML IJ SOLN
4.0000 mg | Freq: Four times a day (QID) | INTRAMUSCULAR | Status: DC | PRN
Start: 2021-08-07 — End: 2021-08-08

## 2021-08-07 NOTE — ED Notes (Signed)
Provided pt w/ soda

## 2021-08-07 NOTE — Consult Note (Signed)
[facility] for Infectious Disease    Date of Admission:  08/07/2021     Total days of antibiotics 0   Azithromycin         Reason for Consult: HIV    Referring Provider: Lorin Mercy Primary Care Provider: Pcp, No   Assessment: John [name] is a 51 y.o. male admitted for hip pain and asthma exacerbation. He has been off his antiretrovirals now for "a while." I see a 30-d fill from our pharmacist in February 2023 but prior to that unclear   Tcells in November 2022 > 600 (40%). I suspect the acute decline in February 2023 was in the setting of acute viral illness given the mismatch between absolute count and percentage. Suspect he has recovered and likely back at previous nadir now. Will follow for repeated result but we can defer bactrim prophylaxis if > 200. Can continue biktarvy (I believe this was switched to allow for inhalers in the past given asthma exacerbations and prednisone use.)   Will arrange TOC to get him Biktarvy 30d supply at discharge and organize an appointment with Marya Amsler in the next few weeks for further fills, financial counseling if needed and referral to [facility] case management team to help with resource barriers for HIV care and adherence.   Being treated for acute asthma/copd exacerbation per primary team. Low concern for OI. Will see what return CD4 is. I suspect he may be elite controller since his entry to care viral load was only 60 copies. FU pending Viral load.   Hospital D/C follow up arranged:   With Terri Piedra, NP   Plan: Resume Biktarvy  Follow pending studies.  Add urine GC/CT and RPR  Quantiferon re-screen given homeless Needs THP referral outpatient     Principal Problem:   Acute asthma exacerbation Active Problems:   HIV disease ([place])   Cocaine use disorder, severe, dependence (HCC)   Tobacco use disorder   Bipolar disorder, curr episode mixed, severe, with psychotic features ([place])   Marijuana dependence ([place])   Hip pain     [START ON 08/08/2021] azithromycin  500 mg Oral Daily   bictegravir-emtricitabine-tenofovir AF  1 tablet Oral Daily   docusate sodium  100 mg Oral BID   enoxaparin (LOVENOX) injection  40 mg Subcutaneous Q24H   feeding supplement  1 Container Oral BID BM   feeding supplement  237 mL Oral BID BM   methylPREDNISolone (SOLU-MEDROL) injection  60 mg Intravenous Q12H   Followed by   Derrill Memo ON 08/08/2021] predniSONE  40 mg Oral Q breakfast   mometasone-formoterol  2 puff Inhalation BID   multivitamin with minerals  1 tablet Oral Daily   nicotine  14 mg Transdermal Daily   QUEtiapine  100 mg Oral QHS   sodium chloride flush  3 mL Intravenous Q12H    HPI: John [name] is a 51 y.o. male admitted for asthma exacerbation and hip pain.   Hospitalist team asked ID to see him given his report of being off ARVs for HIV treatment.    Diagnosed with HIV in Feb 2012 with initial CD4 1180. Previously on Prezcobix + Descovy, Genovya then [employer]. He has been following with our partner Terri Piedra outpatient in the past but has had some care gaps. He says he has not been able to get his medications and cites transportation as the main barrier. He lives currently in stable housing with multiple roommates. He cannot recall when he last had his  medications. LOV with Marya Amsler was November 2022 - he reported he was doing well on Symtuza at that time with undetectable viral load and CD4 482.   Admitted in February for acute influenzae A management. TCell count then acutely dropped to 90 (39%).    Review of Systems: ROS  Past Medical History:  Diagnosis Date   Acute hypoxemic respiratory failure ([place]) 05/07/2021   Adjustment disorder with depressed mood 10/28/2015   Alcoholism ([place])    Asthma    Bipolar disorder ([place])    with depression/anxiety   Cannabis use disorder, moderate, dependence ([place]) 04/02/2015   Chronic low back pain    Cocaine use disorder ([place]) 04/02/2015   Gout    HIV (human immunodeficiency  virus infection) ([place])    "dx'd ~ 2 yr ago" (09/29/2012)   Homelessness    Hypertension    Polysubstance abuse ([place]) 04/11/2019    Social History   Tobacco Use   Smoking status: Every Day    Packs/day: 1.00    Years: 27.00    Pack years: 27.00    Types: Cigarettes   Smokeless tobacco: Never  Vaping Use   Vaping Use: Never used  Substance Use Topics   Alcohol use: Yes    Alcohol/week: 12.0 standard drinks    Types: 12 Cans of beer per week    Comment: h/o heavy use, currently "very little" - "no more than 6 swallows of beer"   Drug use: Yes    Types: Marijuana, "Crack" cocaine    Comment: Marijuana as often as I can.  Crack maybe a couple of times a week.    Family History  Problem Relation Age of Onset   Alcoholism Mother    Depression Mother    Alcoholism Brother    Allergies  Allergen Reactions   Shellfish Allergy Anaphylaxis and Swelling    OBJECTIVE: Blood pressure (!) 141/92, pulse 82, temperature 98.1 F (36.7 C), resp. rate 16, height '5\' 5"'$  (1.651 m), weight 55.3 kg, SpO2 95 %.  Physical Exam  Lab Results Lab Results  Component Value Date   WBC 5.4 08/07/2021   HGB 14.6 08/07/2021   HCT 43.0 08/07/2021   MCV 95.0 08/07/2021   PLT 375 08/07/2021    Lab Results  Component Value Date   CREATININE 1.00 08/07/2021   BUN 17 08/07/2021   NA 141 08/07/2021   K 4.5 08/07/2021   CL 106 08/07/2021   CO2 26 05/09/2021    Lab Results  Component Value Date   ALT 18 05/07/2021   AST 16 05/07/2021   ALKPHOS 81 05/07/2021   BILITOT 0.4 05/07/2021     Microbiology: Recent Results (from the past 240 hour(s))  SARS Coronavirus 2 by RT PCR (hospital order, performed in [facility] hospital lab) *cepheid single result test* Anterior Nasal Swab     Status: None   Collection Time: 08/07/21  5:41 AM   Specimen: Anterior Nasal Swab  Result Value Ref Range Status   SARS Coronavirus 2 by RT PCR NEGATIVE NEGATIVE Final    Comment: (NOTE) SARS-CoV-2 target  nucleic acids are NOT DETECTED.  The SARS-CoV-2 RNA is generally detectable in upper and lower respiratory specimens during the acute phase of infection. The lowest concentration of SARS-CoV-2 viral copies this assay can detect is 250 copies / mL. A negative result does not preclude SARS-CoV-2 infection and should not be used as the sole basis for treatment or other patient management decisions.  A negative result may occur with  improper specimen collection / handling, submission of specimen other than nasopharyngeal swab, presence of viral mutation(s) within the areas targeted by this assay, and inadequate number of viral copies (<250 copies / mL). A negative result must be combined with clinical observations, patient history, and epidemiological information.  Fact Sheet for Patients:   https://www.patel.info/  Fact Sheet for Healthcare Providers: https://hall.com/  This test is not yet approved or  cleared by the [place] FDA and has been authorized for detection and/or diagnosis of SARS-CoV-2 by FDA under an Emergency Use Authorization (EUA).  This EUA will remain in effect (meaning this test can be used) for the duration of the COVID-19 declaration under Section 564(b)(1) of the Act, 21 U.S.C. section 360bbb-3(b)(1), unless the authorization is terminated or revoked sooner.  Performed at [facility], [place] [address], [place], [place] [address]     Janene Madeira, MSN, NP-C [facility] for Infectious [place] Pager: [phone]  08/07/2021 2:54 PM

## 2021-08-07 NOTE — Progress Notes (Signed)
Initial Nutrition Assessment  DOCUMENTATION CODES:   Not applicable  INTERVENTION:  - will order Ensure Plus High Protein BID, each supplement provides 350 kcal and 20 grams of protein. - will order Boost Breeze BID, each supplement provides 250 kcal and 9 grams of protein. - will order 1 tablet multivitamin with minerals/day. - complete NFPE when feasible.    NUTRITION DIAGNOSIS:   Increased nutrient needs related to acute illness (asthma exacerbation) as evidenced by estimated needs.  GOAL:   Patient will meet greater than or equal to 90% of their needs  MONITOR:   PO intake, Supplement acceptance, Labs, Weight trends  REASON FOR ASSESSMENT:   Consult Assessment of nutrition requirement/status  ASSESSMENT:   51 y.o. male with medical history of polysubstance abuse, asthma, HIV, bipolar disorder, homelessness, and HTN. He presented to the ED due to shortness of breath, report of worsening asthma for 3-4 days, and a cough productive of gold-gray sputum. He smokes up to 11 cigarettes/day. Patient also reported L hip pain which is sharp in nature and concentrated behind L hip bone and shoots down his leg leading to difficulty walking. He reports this pain has been ongoing x4-5 months. He was admitted for acute asthma exacerbation.  RD working on another campus. Unable to reach patient by phone at time of attempt. Patient recently moved to [facility] from the ED.   No meal intakes documented d/t short duration of admission.   He was last assessed (remotely) by a [place] RD on 09/18/2020 when he was admitted to [facility] for polysubstance (alcohol, cocaine, and THC) abuse. At that time he had reported decreased appetite and was ordered Ensure supplements.   Weight today is 122 lb and weight on 04/08/21 was 150 lb. This indicates 28 lb weight loss (18.7% body weight) in the past 4 months; significant for time frame.  If weight recordings are accurate, highly suspect malnutrition but unable to  confirm without NFPE.    Labs reviewed; ionized Ca: 1 mmol/l. Medications reviewed; 1 tablet biktarvy/day, 100 mg colace BID, 2 g IV Mg sulfate x1 run 5/26, solu-medrol taper.    NUTRITION - FOCUSED PHYSICAL EXAM:  RD working on another campus.  Diet Order:   Diet Order             Diet regular Room service appropriate? Yes; Fluid consistency: Thin  Diet effective now                   EDUCATION NEEDS:   No education needs have been identified at this time  Skin:  Skin Assessment: Reviewed RN Assessment  Last BM:  PTA/unknown  Height:   Ht Readings from Last 1 Encounters:  08/07/21 '5\' 5"'$  (1.651 m)    Weight:   Wt Readings from Last 1 Encounters:  08/07/21 55.3 kg     BMI:  Body mass index is 20.29 kg/m.  Estimated Nutritional Needs:  Kcal:  1700-1900 kcal Protein:  90-105 grams Fluid:  >/= 1.8 L/day      Jarome Matin, MS, RD, LDN Registered Dietitian II Inpatient Clinical Nutrition RD pager # and on-call/weekend pager # available in [facility]

## 2021-08-07 NOTE — H&P (Signed)
History and Physical    Patient: John [name] YTK:[id number] DOB: 09/23/1970 DOA: 08/07/2021 DOS: the patient was seen and examined on 08/07/2021 PCP: Pcp, No  Patient coming from:  [place] ; NOK: Lesle Reek, [phone]   Chief Complaint: SOB  HPI: Nilesh Stegall is a 51 y.o. male with medical history significant of polysubstance abuse; asthma; HIV; bipolar d/o; homelessness; and HTN presenting with SOB. For the last few days, his asthma has been worsening.   He started having SOB about 3-4 days ago.  He feels like he can't get enough air in.  +cough, productive of gold-gray sputum.  No fevers or sick contacts.  Not on home O2.  He is smoking up to 11 cigs/day.  He has been having L hip pain - told it is sciatica, the pain is sharp behind his hip bone and the pain shoots down his leg and he has difficulty walking.  Hip pain x 4-5 months, gradually worsening.  He was seen in the ER and told it was sciatica.    ER Course:  Carryover, per Dr. Marlowe Sax:  51 year old male with history of HIV, asthma, cocaine abuse, homelessness presenting with shortness of breath and wheezing.  Not hypoxic.  Chest x-ray showing lucency overlying the upper mediastinum raising the question of pneumomediastinum.  CT chest ordered for further evaluation and negative for acute finding.  Patient was given Solu-Medrol, magnesium, and albuterol.     Review of Systems: As mentioned in the history of present illness. All other systems reviewed and are negative. Past Medical History:  Diagnosis Date   Acute hypoxemic respiratory failure ([place]) 05/07/2021   Adjustment disorder with depressed mood 10/28/2015   Alcoholism ([place])    Asthma    Bipolar disorder ([place])    with depression/anxiety   Cannabis use disorder, moderate, dependence ([place]) 04/02/2015   Chronic low back pain    Cocaine use disorder ([place]) 04/02/2015   Gout    HIV (human immunodeficiency virus infection) ([place])    "dx'd ~ 2 yr ago"  (09/29/2012)   Homelessness    Hypertension    Polysubstance abuse ([place]) 04/11/2019   Past Surgical History:  Procedure Laterality Date   SKIN GRAFT FULL THICKNESS LEG Left ?   POSTERIOR LEFT LEG  AFTER DOG BITES   Social History:  reports that he has been smoking cigarettes. He has a 27.00 pack-year smoking history. He has never used smokeless tobacco. He reports current alcohol use of about 12.0 standard drinks per week. He reports current drug use. Drugs: , Marijuana, and "Crack" cocaine.  Allergies  Allergen Reactions   Shellfish Allergy Anaphylaxis and Swelling    Family History  Problem Relation Age of Onset   Alcoholism Mother    Depression Mother    Alcoholism Brother     Prior to Admission medications   Medication Sig Start Date End Date Taking? Authorizing Provider  acetaminophen (TYLENOL) 325 MG tablet Take 2 tablets (650 mg total) by mouth every 6 (six) hours as needed for mild pain (or Fever >/= 101). 04/30/21  Yes Timothy Lasso, MD  albuterol (VENTOLIN HFA) 108 (90 Base) MCG/ACT inhaler Inhale 2 puffs into the lungs every 4 (four) hours as needed for wheezing or shortness of breath. 04/30/21  Yes Timothy Lasso, MD  busPIRone (BUSPAR) 5 MG tablet Take 1.5 tablets (7.5 mg total) by mouth 2 (two) times daily as needed (anxiety). 04/30/21  Yes Timothy Lasso, MD  mirtazapine (REMERON) 15 MG tablet Take 1 tablet (15 mg  total) by mouth at bedtime as needed (sleep). 04/30/21 08/07/21 Yes Timothy Lasso, MD  QUEtiapine (SEROQUEL) 100 MG tablet Take 1 tablet (100 mg total) by mouth at bedtime as needed (sleep). 04/30/21 08/07/21 Yes Timothy Lasso, MD  traZODone (DESYREL) 100 MG tablet Take 100 mg by mouth at bedtime as needed for sleep.   Yes [provider]  bictegravir-emtricitabine-tenofovir AF (BIKTARVY) 50-200-25 MG TABS tablet Take 1 tablet by mouth daily. Patient not taking: Reported on 08/07/2021    [provider]  mometasone-formoterol (DULERA)  100-5 MCG/ACT AERO Inhale 2 puffs into the lungs 2 (two) times daily. Patient not taking: Reported on 08/07/2021 05/08/21   Timothy Lasso, MD  dicyclomine (BENTYL) 20 MG tablet Take 1 tablet (20 mg total) by mouth 2 (two) times daily. 01/08/17 01/03/20  Palumbo, April, MD  sucralfate (CARAFATE) 1 GM/10ML suspension Take 10 mLs (1 g total) by mouth 4 (four) times daily -  with meals and at bedtime. Patient not taking: Reported on 10/21/2017 01/08/17 09/03/18  Randal Buba, April, MD    Physical Exam: Vitals:   08/07/21 0900 08/07/21 0909 08/07/21 0922 08/07/21 0957  BP: 123/74   (!) 141/92  Pulse: 82 80  82  Resp: '20 20  16  '$ Temp:   98.7 F (37.1 C) 98.1 F (36.7 C)  TempSrc:   Oral   SpO2: 97% 98%  100%  Weight:      Height:       General:  Appears calm and comfortable and is in NAD, poor hygiene Eyes:   EOMI, normal lids, iris ENT:  grossly normal hearing, lips & tongue, mmm; poor dentition Neck:  no LAD, masses or thyromegaly Cardiovascular:  RRR, no m/r/g. No LE edema.  Respiratory:   CTA bilaterally with scant wheezes.  Normal to mildly increased respiratory effort.  Intermittent light nonproductive cough Abdomen:  soft, NT, ND Skin:  no rash or induration seen on limited exam Musculoskeletal:  grossly normal tone BUE/BLE, good ROM, no bony abnormality Lower extremity:  No LE edema.  Limited foot exam with no ulcerations.  2+ distal pulses. Psychiatric:  blunted mood and affect, speech fluent and appropriate, AOx3 Neurologic:  CN 2-12 grossly intact, moves all extremities in coordinated fashion   Radiological Exams on Admission: Independently reviewed - see discussion in A/P where applicable  CT Chest Wo Contrast  Result Date: 08/07/2021 CLINICAL DATA:  Nonspecific chest pain with shortness of breath EXAM: CT CHEST WITHOUT CONTRAST TECHNIQUE: Multidetector CT imaging of the chest was performed following the standard protocol without IV contrast. RADIATION DOSE REDUCTION: This  exam was performed according to the departmental dose-optimization program which includes automated exposure control, adjustment of the mA and/or kV according to patient size and/or use of iterative reconstruction technique. COMPARISON:  11/13/2014 FINDINGS: Cardiovascular: Normal heart size. No pericardial effusion. No acute vascular finding without contrast Mediastinum/Nodes: Negative for adenopathy or mass. Lungs/Pleura: Large lung volumes with generalized airway thickening. Paraseptal emphysema at the apices. There is no edema, consolidation, effusion, or pneumothorax. No worrisome pulmonary nodule. Focal mild scarring along the upper left major fissure. Upper Abdomen: Full stomach by semi-solid material, presumably from recent meal. Musculoskeletal: No acute or aggressive finding IMPRESSION: 1. No acute finding. 2. Hyperinflation and generalized airway thickening. 3. Full stomach. Electronically Signed   By: Jorje Guild M.D.   On: 08/07/2021 07:24   DG Chest Portable 1 View  Result Date: 08/07/2021 CLINICAL DATA:  Shortness of breath. EXAM: PORTABLE CHEST 1 VIEW COMPARISON:  05/07/2021  FINDINGS: 0547 hours. Lungs are hyperexpanded. The lungs are clear without focal pneumonia, edema, pneumothorax or pleural effusion. Probable bronchiectasis right parahilar lung. Linear lucency overlies the upper mediastinum. The cardiopericardial silhouette is within normal limits for size. Telemetry leads overlie the chest. IMPRESSION: 1. Linear lucency overlying the upper mediastinum raises the question of pneumomediastinum although this could be projectional. Consider dedicated upright PA and lateral chest x-ray or chest CT to further evaluate. 2. Hyperexpansion with interval resolution of the basilar predominant interstitial and patchy airspace disease noted on the prior study. Electronically Signed   By: Misty Stanley M.D.   On: 08/07/2021 05:59    EKG: Independently reviewed.  NSR with rate 73; no evidence of  acute ischemia   Labs on Admission: I have personally reviewed the available labs and imaging studies at the time of the admission.  Pertinent labs:    Unremarkable ISTAT BMP Normal CBC COVID negative   Assessment and Plan: Principal Problem:   Acute asthma exacerbation Active Problems:   Hip pain   HIV disease (HCC)   Cocaine use disorder, severe, dependence (HCC)   Tobacco use disorder   Bipolar disorder, curr episode mixed, severe, with psychotic features ([place])   Marijuana dependence ([place])   Asthma/COPD exacerbation -Patient's shortness of breath and productive cough are most likely caused by acute asthma vs. COPD exacerbation; this currently appears to be quite mild.  -No O2 requirement -He has not been using Dulera, will resume -He does not have fever or leukocytosis.  -Chest x-ray is not consistent with pneumonia -will observe overnight -Nebulizers: scheduled Duoneb and prn albuterol -Solu-Medrol 60 mg IV BID -> Prednisone 40 mg PO daily -IV -> PO Azithromycin  -Coordinated care with [facility] team/PT/OT/Nutrition consults  Hip pain -Reports marked left hip pain with radiculopathy -No recent imaging, will start with hip and lumbar spine xray -Pain control -May need ortho consult  HIV -He was last seen in ID clinic on 02/10/21 and was taking Symtuza at that time -Last VL was undetectable in 01/2021 and CD4 count was 91 in 04/2021 -He was changed to [facility] in February due to this low CD4 count -He called for samples on 2/16 and was given samples to last 7 days; it is not clear whether he has refilled it since -Not currently taking Biktarvy today because he "ran out" -Will request ID consult  -Based on his overall clinical stability, I am more concerned about his chronic issues (HIV, substance abuse, living conditions) than his acute medical issues at this time  Bipolar d/o -Continue buspirone -Takes mirtazepine, quetiapine, and trazodone all prn for sleep - but it  appears he would benefit more from standing medication in addition to prn -Will change Seroquel to qhs for now  Polysubstance abuse -Acknowledges decreased but ongoing use of ETOH, continued use of crack, and frequent use of THC -Will order UDS -Cessation encouraged -TOC team consult  Tobacco dependence -Encourage cessation.   -This was discussed with the patient and should be reviewed on an ongoing basis.   -Patch ordered     Advance Care Planning:   Code Status: Full Code   Consults: ID; PT/OT; Nutrition; TOC team  DVT Prophylaxis: Lovenox  Family Communication: None present; he declined to have me contact family at the time of admission  Severity of Illness: The appropriate patient status for this patient is OBSERVATION. Observation status is judged to be reasonable and necessary in order to provide the required intensity of service to ensure the patient's  safety. The patient's presenting symptoms, physical exam findings, and initial radiographic and laboratory data in the context of their medical condition is felt to place them at decreased risk for further clinical deterioration. Furthermore, it is anticipated that the patient will be medically stable for discharge from the hospital within 2 midnights of admission.   Author: Karmen Bongo, MD 08/07/2021 10:58 AM  For on call review [email or web address].
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
## 2021-08-07 NOTE — ED Provider Notes (Signed)
[facility] EMERGENCY DEPARTMENT Provider Note   CSN: [id number] Arrival date & time: 08/07/21  0505     History  Chief Complaint  Patient presents with   Shortness of Breath    John [name] is a 51 y.o. male.  The history is provided by the patient and the EMS personnel. The history is limited by the condition of the patient.  Shortness of Breath Severity:  Severe Onset quality:  Gradual Duration:  2 days Timing:  Constant Progression:  Unchanged Chronicity:  Recurrent Context: not animal exposure   Relieved by:  Nothing Worsened by:  Nothing Ineffective treatments:  None tried Associated symptoms: wheezing   Associated symptoms: no abdominal pain, no chest pain and no fever   Risk factors: tobacco use   Risk factors: no family hx of DVT   Patient with asthma vs COPD who presents with 2 days of wheezing and SOB.      Home Medications Prior to Admission medications   Medication Sig Start Date End Date Taking? Authorizing Provider  acetaminophen (TYLENOL) 325 MG tablet Take 2 tablets (650 mg total) by mouth every 6 (six) hours as needed for mild pain (or Fever >/= 101). 04/30/21  Yes Timothy Lasso, MD  albuterol (VENTOLIN HFA) 108 (90 Base) MCG/ACT inhaler Inhale 2 puffs into the lungs every 4 (four) hours as needed for wheezing or shortness of breath. 04/30/21  Yes Timothy Lasso, MD  busPIRone (BUSPAR) 5 MG tablet Take 1.5 tablets (7.5 mg total) by mouth 2 (two) times daily as needed (anxiety). 04/30/21  Yes Timothy Lasso, MD  mirtazapine (REMERON) 15 MG tablet Take 1 tablet (15 mg total) by mouth at bedtime as needed (sleep). 04/30/21 08/07/21 Yes Timothy Lasso, MD  QUEtiapine (SEROQUEL) 100 MG tablet Take 1 tablet (100 mg total) by mouth at bedtime as needed (sleep). 04/30/21 08/07/21 Yes Timothy Lasso, MD  traZODone (DESYREL) 100 MG tablet Take 100 mg by mouth at bedtime as needed for sleep.   Yes [provider]   bictegravir-emtricitabine-tenofovir AF (BIKTARVY) 50-200-25 MG TABS tablet Take 1 tablet by mouth daily. Patient not taking: Reported on 08/07/2021    [provider]  mometasone-formoterol (DULERA) 100-5 MCG/ACT AERO Inhale 2 puffs into the lungs 2 (two) times daily. Patient not taking: Reported on 08/07/2021 05/08/21   Timothy Lasso, MD  dicyclomine (BENTYL) 20 MG tablet Take 1 tablet (20 mg total) by mouth 2 (two) times daily. 01/08/17 01/03/20  Kirstan Fentress, MD  sucralfate (CARAFATE) 1 GM/10ML suspension Take 10 mLs (1 g total) by mouth 4 (four) times daily -  with meals and at bedtime. Patient not taking: Reported on 10/21/2017 01/08/17 09/03/18  Randal Buba, Jaymes Revels, MD      Allergies    Shellfish allergy    Review of Systems   Review of Systems  Constitutional:  Negative for fever.  HENT:  Negative for facial swelling.   Eyes:  Negative for redness.  Respiratory:  Positive for shortness of breath and wheezing.   Cardiovascular:  Negative for chest pain, palpitations and leg swelling.  Gastrointestinal:  Negative for abdominal pain.  Genitourinary:  Negative for dysuria.  Neurological:  Negative for facial asymmetry.  Psychiatric/Behavioral:  Negative for agitation.   All other systems reviewed and are negative.  Physical Exam Updated Vital Signs BP 126/69   Pulse 82   Resp (!) 22   SpO2 96%  Physical Exam Vitals and nursing note reviewed.  Constitutional:      General: He is  not in acute distress.    Appearance: Normal appearance.  HENT:     Head: Normocephalic and atraumatic.     Nose: Nose normal.  Eyes:     Conjunctiva/sclera: Conjunctivae normal.     Pupils: Pupils are equal, round, and reactive to light.  Cardiovascular:     Rate and Rhythm: Normal rate and regular rhythm.     Pulses: Normal pulses.     Heart sounds: Normal heart sounds.  Pulmonary:     Breath sounds: Wheezing present. No rales.  Abdominal:     General: Bowel sounds are normal.      Palpations: Abdomen is soft.     Tenderness: There is no abdominal tenderness. There is no guarding.  Musculoskeletal:        General: Normal range of motion.     Cervical back: Normal range of motion and neck supple.     Right lower leg: No edema.     Left lower leg: No edema.  Skin:    General: Skin is warm and dry.     Capillary Refill: Capillary refill takes less than 2 seconds.  Neurological:     General: No focal deficit present.     Mental Status: He is alert and oriented to person, place, and time.     Deep Tendon Reflexes: Reflexes normal.  Psychiatric:        Mood and Affect: Mood normal.        Behavior: Behavior normal.    ED Results / Procedures / Treatments   Labs (all labs ordered are listed, but only abnormal results are displayed) Results for orders placed or performed during the hospital encounter of 08/07/21  CBC with Differential  Result Value Ref Range   WBC 5.4 4.0 - 10.5 K/uL   RBC 4.58 4.22 - 5.81 MIL/uL   Hemoglobin 14.1 13.0 - 17.0 g/dL   HCT 43.5 39.0 - 52.0 %   MCV 95.0 80.0 - 100.0 fL   MCH 30.8 26.0 - 34.0 pg   MCHC 32.4 30.0 - 36.0 g/dL   RDW 12.5 11.5 - 15.5 %   Platelets 375 150 - 400 K/uL   nRBC 0.0 0.0 - 0.2 %   Neutrophils Relative % 31 %   Neutro Abs 1.7 1.7 - 7.7 K/uL   Lymphocytes Relative 53 %   Lymphs Abs 2.8 0.7 - 4.0 K/uL   Monocytes Relative 11 %   Monocytes Absolute 0.6 0.1 - 1.0 K/uL   Eosinophils Relative 4 %   Eosinophils Absolute 0.2 0.0 - 0.5 K/uL   Basophils Relative 1 %   Basophils Absolute 0.0 0.0 - 0.1 K/uL   Immature Granulocytes 0 %   Abs Immature Granulocytes 0.01 0.00 - 0.07 K/uL  I-stat chem 8, ED (not at [facility] or [facility])  Result Value Ref Range   Sodium 141 135 - 145 mmol/L   Potassium 4.5 3.5 - 5.1 mmol/L   Chloride 106 98 - 111 mmol/L   BUN 17 6 - 20 mg/dL   Creatinine, Ser 1.00 0.61 - 1.24 mg/dL   Glucose, Bld 72 70 - 99 mg/dL   Calcium, Ion 1.00 (L) 1.15 - 1.40 mmol/L   TCO2 28 22 - 32 mmol/L    Hemoglobin 14.6 13.0 - 17.0 g/dL   HCT 43.0 39.0 - 52.0 %   DG Chest Portable 1 View  Result Date: 08/07/2021 CLINICAL DATA:  Shortness of breath. EXAM: PORTABLE CHEST 1 VIEW COMPARISON:  05/07/2021 FINDINGS: 0547 hours. Lungs are  hyperexpanded. The lungs are clear without focal pneumonia, edema, pneumothorax or pleural effusion. Probable bronchiectasis right parahilar lung. Linear lucency overlies the upper mediastinum. The cardiopericardial silhouette is within normal limits for size. Telemetry leads overlie the chest. IMPRESSION: 1. Linear lucency overlying the upper mediastinum raises the question of pneumomediastinum although this could be projectional. Consider dedicated upright PA and lateral chest x-ray or chest CT to further evaluate. 2. Hyperexpansion with interval resolution of the basilar predominant interstitial and patchy airspace disease noted on the prior study. Electronically Signed   By: Misty Stanley M.D.   On: 08/07/2021 05:59      EKG  EKG Interpretation  Date/Time:  Friday Aug 07 2021 05:11:25 EDT Ventricular Rate:  73 PR Interval:  133 QRS Duration: 89 QT Interval:  363 QTC Calculation: 400 R Axis:   -19 Text Interpretation: Sinus rhythm Borderline left axis deviation Confirmed by Randal Buba, Nelli Swalley ([id number]) on 08/07/2021 6:38:06 AM         Radiology DG Chest Portable 1 View  Result Date: 08/07/2021 CLINICAL DATA:  Shortness of breath. EXAM: PORTABLE CHEST 1 VIEW COMPARISON:  05/07/2021 FINDINGS: 0547 hours. Lungs are hyperexpanded. The lungs are clear without focal pneumonia, edema, pneumothorax or pleural effusion. Probable bronchiectasis right parahilar lung. Linear lucency overlies the upper mediastinum. The cardiopericardial silhouette is within normal limits for size. Telemetry leads overlie the chest. IMPRESSION: 1. Linear lucency overlying the upper mediastinum raises the question of pneumomediastinum although this could be projectional. Consider dedicated  upright PA and lateral chest x-ray or chest CT to further evaluate. 2. Hyperexpansion with interval resolution of the basilar predominant interstitial and patchy airspace disease noted on the prior study. Electronically Signed   By: Misty Stanley M.D.   On: 08/07/2021 05:59    Procedures Procedures    Medications Ordered in ED Medications  magnesium sulfate IVPB 2 g 50 mL (2 g Intravenous New Bag/Given 08/07/21 0541)  albuterol (PROVENTIL) (2.5 MG/3ML) 0.083% nebulizer solution 5 mg (has no administration in time range)  methylPREDNISolone sodium succinate (SOLU-MEDROL) 125 mg/2 mL injection 125 mg (125 mg Intravenous Given 08/07/21 0539)  loratadine (CLARITIN) tablet 10 mg (10 mg Oral Given 08/07/21 0538)  albuterol (PROVENTIL) (2.5 MG/3ML) 0.083% nebulizer solution 5 mg (5 mg Nebulization Given 08/07/21 4825)    ED Course/ Medical Decision Making/ A&P                           Medical Decision Making Wheezing and SOB x 2 days.    Amount and/or Complexity of Data Reviewed Independent Historian: EMS    Details: see above External Data Reviewed: notes.    Details: previous notes Labs: ordered.    Details: all labs reviewed:  Normal sodium 141 and potassium 4.5 and normal creatinine 1.  Normal white count 5.4 and normal hemoglobin 14.1 and normal platelets 375k Radiology: ordered.    Details: No PNA by me on CXR ECG/medicine tests: ordered and independent interpretation performed. Decision-making details documented in ED Course.  Risk OTC drugs. Prescription drug management. Decision regarding hospitalization. Risk Details: COntinued nebs with solumedrol and magnesium.  Patient likely does not have access to medications as he is homeless.      Final Clinical Impression(s) / ED Diagnoses Final diagnoses:  COPD exacerbation ([place])   The patient appears reasonably stabilized for admission considering the current resources, flow, and capabilities available in the ED at this time, and  I doubt any other [facility] requiring  further screening and/or treatment in the ED prior to admission.     Dhrithi Riche, MD 08/07/21 [phone]
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
## 2021-08-07 NOTE — ED Notes (Signed)
Pt back in ED room from CT

## 2021-08-07 NOTE — ED Triage Notes (Signed)
Pt BIB [facility], hx asthma, c/o shortness of breath x 2 days, worsening tonight. Given albuterol treatment by EMS, pt refused IV at the time, no other meds administered pta. Current everyday smoker.

## 2021-08-07 NOTE — ED Notes (Signed)
Pt given ice cream

## 2021-08-08 DIAGNOSIS — F3164 Bipolar disorder, current episode mixed, severe, with psychotic features: Secondary | ICD-10-CM | POA: Diagnosis not present

## 2021-08-08 DIAGNOSIS — J4541 Moderate persistent asthma with (acute) exacerbation: Secondary | ICD-10-CM | POA: Diagnosis not present

## 2021-08-08 DIAGNOSIS — F1910 Other psychoactive substance abuse, uncomplicated: Secondary | ICD-10-CM | POA: Diagnosis present

## 2021-08-08 DIAGNOSIS — F17210 Nicotine dependence, cigarettes, uncomplicated: Secondary | ICD-10-CM | POA: Diagnosis present

## 2021-08-08 DIAGNOSIS — F1220 Cannabis dependence, uncomplicated: Secondary | ICD-10-CM | POA: Diagnosis present

## 2021-08-08 DIAGNOSIS — F1590 Other stimulant use, unspecified, uncomplicated: Secondary | ICD-10-CM | POA: Diagnosis present

## 2021-08-08 DIAGNOSIS — Z91013 Allergy to seafood: Secondary | ICD-10-CM | POA: Diagnosis not present

## 2021-08-08 DIAGNOSIS — Z79899 Other long term (current) drug therapy: Secondary | ICD-10-CM | POA: Diagnosis not present

## 2021-08-08 DIAGNOSIS — Z5900 Homelessness unspecified: Secondary | ICD-10-CM | POA: Diagnosis not present

## 2021-08-08 DIAGNOSIS — F315 Bipolar disorder, current episode depressed, severe, with psychotic features: Secondary | ICD-10-CM | POA: Diagnosis present

## 2021-08-08 DIAGNOSIS — Z20822 Contact with and (suspected) exposure to covid-19: Secondary | ICD-10-CM | POA: Diagnosis present

## 2021-08-08 DIAGNOSIS — B20 Human immunodeficiency virus [HIV] disease: Secondary | ICD-10-CM | POA: Diagnosis present

## 2021-08-08 DIAGNOSIS — J441 Chronic obstructive pulmonary disease with (acute) exacerbation: Secondary | ICD-10-CM | POA: Diagnosis present

## 2021-08-08 DIAGNOSIS — M25552 Pain in left hip: Secondary | ICD-10-CM | POA: Diagnosis present

## 2021-08-08 DIAGNOSIS — F1420 Cocaine dependence, uncomplicated: Secondary | ICD-10-CM | POA: Diagnosis not present

## 2021-08-08 LAB — HIV-1 RNA QUANT-NO REFLEX-BLD
HIV 1 RNA Quant: 590 copies/mL
LOG10 HIV-1 RNA: 2.771 log10copy/mL

## 2021-08-08 MED ORDER — GABAPENTIN 100 MG PO CAPS: 100.0000 mg | ORAL_CAPSULE | Freq: Three times a day (TID) | ORAL | 0 refills | Status: DC

## 2021-08-08 MED ORDER — DULERA 100-5 MCG/ACT IN AERO: 2.0000 | INHALATION_SPRAY | Freq: Two times a day (BID) | RESPIRATORY_TRACT | 2 refills | Status: DC

## 2021-08-08 MED ORDER — GABAPENTIN 100 MG PO CAPS: 100.0000 mg | ORAL_CAPSULE | Freq: Three times a day (TID) | ORAL | Status: DC

## 2021-08-08 MED ORDER — BIKTARVY 50-200-25 MG PO TABS: 1.0000 | ORAL_TABLET | Freq: Every day | ORAL | 2 refills | Status: AC

## 2021-08-08 MED ORDER — ALBUTEROL SULFATE HFA 108 (90 BASE) MCG/ACT IN AERS: 2.0000 | INHALATION_SPRAY | RESPIRATORY_TRACT | 2 refills | Status: DC | PRN

## 2021-08-08 MED ORDER — ENSURE ENLIVE PO LIQD
237.0000 mL | Freq: Two times a day (BID) | ORAL | 12 refills | Status: DC
Start: 2021-08-08 — End: 2021-11-26

## 2021-08-08 NOTE — Discharge Instructions (Addendum)
John [name],  You were in the hospital with some wheezing which quickly resolved. You will not need any new medication on discharge. Please follow-up with the infectious disease office and the internal medicine resident clinic office.

## 2021-08-08 NOTE — Consult Note (Signed)
[facility] Consult   Reason for Consult: Passive suicidal ideation Referring Physician: Hospitalist Patient Identification: John [name] MRN:  [id number] Principal Diagnosis: Acute asthma exacerbation Diagnosis:  Principal Problem:   Acute asthma exacerbation Active Problems:   HIV disease ([place])   Cocaine use disorder, severe, dependence ([place])   Tobacco use disorder   Bipolar disorder, curr episode mixed, severe, with psychotic features ([place])   Marijuana dependence ([place])   Hip pain   Total Time spent with patient: 20 minutes  Subjective:   John [name] is a 51 y.o. male patient admitted with complaints of shortness of breath.  Patient has a medical history significant for polysubstance use, asthma, HIV, hypertension and bipolar disorder.  Patient made a statement that he was having passive suicidal thoughts and a psychiatric consult was placed.  HPI: Patient has a history of polysubstance use, asthma, HIV, hypertension and bipolar disorder.  Patient reports that his episodes of having racing thought are  related to his cocaine use.  Patient states that he is currently not taking medications, does not feel he needs them.  Patient states that he has a place to live, lives in a boardinghouse, reports his family is supportive.  On being questioned if he would like substance use treatment, patient states that he plans to stop himself, knows that he can present to the [facility] if he needs help.  Patient states that he has no thoughts of hurting himself, thoughts of hurting others, any symptoms of mania or psychosis or paranoia.    Past Psychiatric History: Patient well-known to service due to his substance use, and being homeless in the past.  Patient is not compliant with treatment.  Risk to Self:   Risk to Others:   Prior Inpatient Therapy:   Prior Outpatient Therapy:    Past Medical History:  Past Medical History:  Diagnosis Date   Acute hypoxemic respiratory  failure ([place]) 05/07/2021   Adjustment disorder with depressed mood 10/28/2015   Alcoholism ([place])    Asthma    Bipolar disorder ([place])    with depression/anxiety   Cannabis use disorder, moderate, dependence ([place]) 04/02/2015   Chronic low back pain    Cocaine use disorder ([place]) 04/02/2015   Gout    HIV (human immunodeficiency virus infection) ([place])    "dx'd ~ 2 yr ago" (09/29/2012)   Homelessness    Hypertension    Polysubstance abuse ([place]) 04/11/2019    Past Surgical History:  Procedure Laterality Date   SKIN GRAFT FULL THICKNESS LEG Left ?   POSTERIOR LEFT LEG  AFTER DOG BITES   Family History:  Family History  Problem Relation Age of Onset   Alcoholism Mother    Depression Mother    Alcoholism Brother    Family Psychiatric  History: Family history of alcoholism, depression Social History:  Social History   Substance and Sexual Activity  Alcohol Use Yes   Alcohol/week: 12.0 standard drinks   Types: 12 Cans of beer per week   Comment: h/o heavy use, currently "very little" - "no more than 6 swallows of beer"     Social History   Substance and Sexual Activity  Drug Use Yes   Types: Marijuana, "Crack" cocaine   Comment: Marijuana as often as I can.  Crack maybe a couple of times a week.    Social History   Socioeconomic History   Marital status: Single    Spouse name: Not on file   Number of children: 0   Years of  education: Not on file   Highest education level: 9th grade  Occupational History   Occupation: disabled  Tobacco Use   Smoking status: Every Day    Packs/day: 1.00    Years: 27.00    Pack years: 27.00    Types: Cigarettes   Smokeless tobacco: Never  Vaping Use   Vaping Use: Never used  Substance and Sexual Activity   Alcohol use: Yes    Alcohol/week: 12.0 standard drinks    Types: 12 Cans of beer per week    Comment: h/o heavy use, currently "very little" - "no more than 6 swallows of beer"   Drug use: Yes    Types: Marijuana, "Crack"  cocaine    Comment: Marijuana as often as I can.  Crack maybe a couple of times a week.   Sexual activity: Not Currently    Partners: Male    Birth control/protection: Condom    Comment: given condoms 01/2021  Other Topics Concern   Not on file  Social History Narrative   ** Merged History Encounter **       ** Merged History Encounter **       Social Determinants of Health   Financial Resource Strain: Not on file  Food Insecurity: Not on file  Transportation Needs: Not on file  Physical Activity: Not on file  Stress: Not on file  Social Connections: Not on file   Additional Social History:    Allergies:   Allergies  Allergen Reactions   Shellfish Allergy Anaphylaxis and Swelling    Labs:  Results for orders placed or performed during the hospital encounter of 08/07/21 (from the past 48 hour(s))  CBC with Differential     Status: None   Collection Time: 08/07/21  5:25 AM  Result Value Ref Range   WBC 5.4 4.0 - 10.5 K/uL   RBC 4.58 4.22 - 5.81 MIL/uL   Hemoglobin 14.1 13.0 - 17.0 g/dL   HCT 43.5 39.0 - 52.0 %   MCV 95.0 80.0 - 100.0 fL   MCH 30.8 26.0 - 34.0 pg   MCHC 32.4 30.0 - 36.0 g/dL   RDW 12.5 11.5 - 15.5 %   Platelets 375 150 - 400 K/uL   nRBC 0.0 0.0 - 0.2 %   Neutrophils Relative % 31 %   Neutro Abs 1.7 1.7 - 7.7 K/uL   Lymphocytes Relative 53 %   Lymphs Abs 2.8 0.7 - 4.0 K/uL   Monocytes Relative 11 %   Monocytes Absolute 0.6 0.1 - 1.0 K/uL   Eosinophils Relative 4 %   Eosinophils Absolute 0.2 0.0 - 0.5 K/uL   Basophils Relative 1 %   Basophils Absolute 0.0 0.0 - 0.1 K/uL   Immature Granulocytes 0 %   Abs Immature Granulocytes 0.01 0.00 - 0.07 K/uL    Comment: Performed at [facility], [address], [place], [place] [address]  Troponin I (High Sensitivity)     Status: None   Collection Time: 08/07/21  5:25 AM  Result Value Ref Range   Troponin I (High Sensitivity) 4 <18 ng/L    Comment: (NOTE) Elevated high sensitivity troponin I  (hsTnI) values and significant  changes across serial measurements may suggest ACS but many other  chronic and acute conditions are known to elevate hsTnI results.  Refer to the "Links" section for chest pain algorithms and additional  guidance. Performed at [facility], [place] [address], [place], [place] [address]   SARS Coronavirus 2 by  RT PCR (hospital order, performed in [facility] hospital lab) *cepheid single result test* Anterior Nasal Swab     Status: None   Collection Time: 08/07/21  5:41 AM   Specimen: Anterior Nasal Swab  Result Value Ref Range   SARS Coronavirus 2 by RT PCR NEGATIVE NEGATIVE    Comment: (NOTE) SARS-CoV-2 target nucleic acids are NOT DETECTED.  The SARS-CoV-2 RNA is generally detectable in upper and lower respiratory specimens during the acute phase of infection. The lowest concentration of SARS-CoV-2 viral copies this assay can detect is 250 copies / mL. A negative result does not preclude SARS-CoV-2 infection and should not be used as the sole basis for treatment or other patient management decisions.  A negative result may occur with improper specimen collection / handling, submission of specimen other than nasopharyngeal swab, presence of viral mutation(s) within the areas targeted by this assay, and inadequate number of viral copies (<250 copies / mL). A negative result must be combined with clinical observations, patient history, and epidemiological information.  Fact Sheet for Patients:   https://www.patel.info/  Fact Sheet for Healthcare Providers: https://hall.com/  This test is not yet approved or  cleared by the [place] FDA and has been authorized for detection and/or diagnosis of SARS-CoV-2 by FDA under an Emergency Use Authorization (EUA).  This EUA will remain in effect (meaning this test can be used) for the duration of the COVID-19 declaration under Section 564(b)(1) of the Act, 21  U.S.C. section 360bbb-3(b)(1), unless the authorization is terminated or revoked sooner.  Performed at [facility], [place] [address] [place], [place] [address]   I-stat chem 8, ED (not at [facility] or [facility])     Status: Abnormal   Collection Time: 08/07/21  6:01 AM  Result Value Ref Range   Sodium 141 135 - 145 mmol/L   Potassium 4.5 3.5 - 5.1 mmol/L   Chloride 106 98 - 111 mmol/L   BUN 17 6 - 20 mg/dL   Creatinine, Ser 1.00 0.61 - 1.24 mg/dL   Glucose, Bld 72 70 - 99 mg/dL    Comment: Glucose reference range applies only to samples taken after fasting for at least 8 hours.   Calcium, Ion 1.00 (L) 1.15 - 1.40 mmol/L   TCO2 28 22 - 32 mmol/L   Hemoglobin 14.6 13.0 - 17.0 g/dL   HCT 43.0 39.0 - 52.0 %  Troponin I (High Sensitivity)     Status: None   Collection Time: 08/07/21  7:39 AM  Result Value Ref Range   Troponin I (High Sensitivity) 4 <18 ng/L    Comment: (NOTE) Elevated high sensitivity troponin I (hsTnI) values and significant  changes across serial measurements may suggest ACS but many other  chronic and acute conditions are known to elevate hsTnI results.  Refer to the "Links" section for chest pain algorithms and additional  guidance. Performed at [facility], [place] [address], [place], [place] [address]   Rapid urine drug screen (hospital performed)     Status: Abnormal   Collection Time: 08/07/21  2:40 PM  Result Value Ref Range   Opiates NONE DETECTED NONE DETECTED   Cocaine POSITIVE (A) NONE DETECTED   Benzodiazepines NONE DETECTED NONE DETECTED   Amphetamines NONE DETECTED NONE DETECTED   Tetrahydrocannabinol POSITIVE (A) NONE DETECTED   Barbiturates NONE DETECTED NONE DETECTED    Comment: (NOTE) DRUG SCREEN FOR MEDICAL PURPOSES ONLY.  IF CONFIRMATION IS NEEDED FOR ANY PURPOSE, NOTIFY LAB WITHIN 5 DAYS.  LOWEST DETECTABLE LIMITS FOR URINE DRUG SCREEN Drug Class                     Cutoff (ng/mL) Amphetamine and metabolites    1000 Barbiturate  and metabolites    200 Benzodiazepine                 229 Tricyclics and metabolites     300 Opiates and metabolites        300 Cocaine and metabolites        300 THC                            50 Performed at [facility], [place] [address] [place], [place] [address]     Current Facility-Administered Medications  Medication Dose Route Frequency Provider Last Rate Last Admin   acetaminophen (TYLENOL) tablet 650 mg  650 mg Oral Q6H PRN Karmen Bongo, MD       Or   acetaminophen (TYLENOL) suppository 650 mg  650 mg Rectal Q6H PRN Karmen Bongo, MD       albuterol (PROVENTIL) (2.5 MG/3ML) 0.083% nebulizer solution 2.5 mg  2.5 mg Nebulization Q2H PRN Karmen Bongo, MD       azithromycin [facility]) tablet 500 mg  500 mg Oral Daily Karmen Bongo, MD   500 mg at 08/08/21 0935   bictegravir-emtricitabine-tenofovir AF (BIKTARVY) 50-200-25 MG per tablet 1 tablet  1 tablet Oral Daily Karmen Bongo, MD   1 tablet at 08/08/21 1194   bisacodyl (DULCOLAX) EC tablet 5 mg  5 mg Oral Daily PRN Karmen Bongo, MD       busPIRone (BUSPAR) tablet 7.5 mg  7.5 mg Oral BID PRN Karmen Bongo, MD       docusate sodium (COLACE) capsule 100 mg  100 mg Oral BID Karmen Bongo, MD   100 mg at 08/08/21 0935   enoxaparin (LOVENOX) injection 40 mg  40 mg Subcutaneous Q24H Karmen Bongo, MD   40 mg at 08/08/21 0935   feeding supplement (BOOST / RESOURCE BREEZE) liquid 1 Container  1 Container Oral BID BM Karmen Bongo, MD   1 Container at 08/07/21 2240   feeding supplement (ENSURE ENLIVE / ENSURE PLUS) liquid 237 mL  237 mL Oral BID BM Karmen Bongo, MD   237 mL at 08/08/21 0938   guaiFENesin (MUCINEX) 12 hr tablet 600 mg  600 mg Oral BID PRN Karmen Bongo, MD       hydrALAZINE (APRESOLINE) injection 5 mg  5 mg Intravenous Q4H PRN Karmen Bongo, MD       methocarbamol (ROBAXIN) 500 mg in dextrose 5 % 50 mL IVPB  500 mg Intravenous Q6H PRN Karmen Bongo, MD       mometasone-formoterol  [facility]) 100-5 MCG/ACT inhaler 2 puff  2 puff Inhalation BID Karmen Bongo, MD   2 puff at 08/08/21 1001   multivitamin with minerals tablet 1 tablet  1 tablet Oral Daily Karmen Bongo, MD   1 tablet at 08/07/21 2240   nicotine (NICODERM CQ - dosed in mg/24 hours) patch 14 mg  14 mg Transdermal Daily Karmen Bongo, MD   14 mg at 08/08/21 0939   ondansetron (ZOFRAN) tablet 4 mg  4 mg Oral Q6H PRN Karmen Bongo, MD       Or   ondansetron [facility]) injection 4 mg  4 mg Intravenous Q6H PRN Karmen Bongo, MD       oxyCODONE (Oxy  IR/ROXICODONE) immediate release tablet 5 mg  5 mg Oral Q4H PRN Karmen Bongo, MD       polyethylene glycol (MIRALAX / GLYCOLAX) packet 17 g  17 g Oral Daily PRN Karmen Bongo, MD       predniSONE (DELTASONE) tablet 40 mg  40 mg Oral Q breakfast Karmen Bongo, MD   40 mg at 08/08/21 4431   QUEtiapine (SEROQUEL) tablet 100 mg  100 mg Oral Ivery Quale, MD   100 mg at 08/07/21 2240   sodium chloride flush (NS) 0.9 % injection 3 mL  3 mL Intravenous Lillia [place], MD   3 mL at 08/07/21 2243    Musculoskeletal: Strength & Muscle Tone: within normal limits Gait & Station: normal Patient leans: N/A            Psychiatric Specialty Exam:  Presentation  General Appearance: Appropriate for Environment  Eye Contact:Good  Speech:Clear and Coherent  Speech Volume:Normal  Handedness:Right   Mood and Affect  Mood:Euthymic  Affect:Congruent; Full Range   Thought Process  Thought Processes:Coherent; Goal Directed  Descriptions of Associations:Intact  Orientation:Full (Time, Place and Person)  Thought Content:Logical; WDL; Abstract Reasoning  History of Schizophrenia/Schizoaffective disorder:Yes  Duration of Psychotic Symptoms:Greater than six months  Hallucinations:Hallucinations: None  Ideas of Reference:None  Suicidal Thoughts:Suicidal Thoughts: No  Homicidal Thoughts:Homicidal Thoughts: No   Sensorium   Memory:Immediate Good; Recent Good; Remote Good  Judgment:Intact  Insight:Lacking   Executive Functions  Concentration:Fair  Attention Span:Fair  [place]   Psychomotor Activity  Psychomotor Activity:Psychomotor Activity: Normal   Assets  Assets:Communication Skills; Leisure Time; Social Support; Housing   Sleep  Sleep:Sleep: Fair   Physical Exam: Physical Exam Review of Systems  Constitutional: Negative.   Eyes: Negative.   Cardiovascular: Negative.   Gastrointestinal: Negative.   Neurological:  Negative for dizziness, seizures, loss of consciousness and weakness.  Endo/Heme/Allergies: Negative.   Psychiatric/Behavioral:  Positive for substance abuse. Negative for depression, hallucinations, memory loss and suicidal ideas. The patient is not nervous/anxious and does not have insomnia.   Blood pressure 128/79, pulse 78, temperature 98.2 F (36.8 C), temperature source Oral, resp. rate 18, height '5\' 5"'$  (1.651 m), weight 55.3 kg, SpO2 99 %. Body mass index is 20.29 kg/m.  Treatment Plan Summary: Plan patient does not want substance use treatment, is not suicidal, homicidal or psychotic.  Patient can be discharged once medically stable. Patient also not willing for outpatient resources in regards to his substance use or therapy or med management by psychiatry.  Disposition: No evidence of imminent risk to self or others at present.   Patient does not meet criteria for psychiatric inpatient admission. Supportive therapy provided about ongoing stressors. Discussed crisis plan, support from social network, calling 911, coming to the Emergency Department, and calling Suicide Hotline.  Hampton Abbot, MD 08/08/2021 10:42 AM

## 2021-08-08 NOTE — Evaluation (Signed)
Physical Therapy Evaluation & Discharge Patient Details Name: John [name] MRN: [id number] DOB: 06/08/1970 Today's Date: 08/08/2021  History of Present Illness  51 y/o male presented to ED on 08/07/21 for complaints of SOB. Admitted for asthma/COPD exacerbation. PMH: HIV, cocaine use disorder, bipolar, schizophrenia, marijuana dependence, HTN, COPD  Clinical Impression  Patient admitted with the above findings. Patient currently functioning at baseline for mobility with demonstration of decreased stance time on L due to pain and weakness from hx of sciatica. Instructed patient on piriformis stretches, ball on wall massage technique, and hamstring stretch. Patient interested in transportation to/from doctor appointments, notified RN and case Freight forwarder. Patient would benefit from use of cane for stability with mobility. No further skilled PT needs identified acutely. No PT follow up recommended at this time.      Recommendations for follow up therapy are one component of a multi-disciplinary discharge planning process, led by the attending physician.  Recommendations may be updated based on patient status, additional functional criteria and insurance authorization.  Follow Up Recommendations No PT follow up    Assistance Recommended at Discharge None  Patient can return home with the following       Equipment Recommendations Cane  Recommendations for Other Services       Functional Status Assessment Patient has not had a recent decline in their functional status     Precautions / Restrictions Precautions Precautions: None Restrictions Weight Bearing Restrictions: No      Mobility  Bed Mobility               General bed mobility comments: sitting on EOB with OT present    Transfers Overall transfer level: Modified independent Equipment used: None               General transfer comment: increased time to complete    Ambulation/Gait Ambulation/Gait assistance:  Modified independent (Device/Increase time) Gait Distance (Feet): 20 Feet Assistive device: None Gait Pattern/deviations: Decreased stance time - left, Step-through pattern, Decreased weight shift to left, Decreased stride length, Antalgic Gait velocity: decreased     General Gait Details: antalgic gait pattern due to L LE pain and weakness. Decreased stance time on L. Patient states this is baseline due to hx of sciatic pain  Stairs            Wheelchair Mobility    Modified Rankin (Stroke Patients Only)       Balance Overall balance assessment: Mild deficits observed, not formally tested                                           Pertinent Vitals/Pain Pain Assessment Pain Assessment: Faces Faces Pain Scale: Hurts even more Pain Location: L calf Pain Descriptors / Indicators: Discomfort, Cramping, Grimacing Pain Intervention(s): Monitored during session, Repositioned    Home Living Family/patient expects to be discharged to:: Shelter/Homeless                   Additional Comments: lives in boarding house with 4 other men    Prior Function Prior Level of Function : Independent/Modified Independent                     Hand Dominance        Extremity/Trunk Assessment   Upper Extremity Assessment Upper Extremity Assessment: Defer to OT evaluation    Lower Extremity Assessment Lower Extremity  Assessment: LLE deficits/detail LLE Deficits / Details: inconsistent performance for MMT. From performance, grossly 4-/5    Cervical / Trunk Assessment Cervical / Trunk Assessment: Normal  Communication   Communication: No difficulties  Cognition Arousal/Alertness: Awake/alert Behavior During Therapy: WFL for tasks assessed/performed Overall Cognitive Status: History of cognitive impairments - at baseline                                 General Comments: hx of bipolar and schizophrenia at baseline        General  Comments      Exercises Other Exercises Other Exercises: Piriformis stretching, ball on wall massage, and hamstring stretch provided to patient   Assessment/Plan    PT Assessment Patient does not need any further PT services  PT Problem List         PT Treatment Interventions      PT Goals (Current goals can be found in the Care Plan section)  Acute Rehab PT Goals Patient Stated Goal: to reduce pain PT Goal Formulation: All assessment and education complete, DC therapy    Frequency       Co-evaluation               AM-PAC PT "6 Clicks" Mobility  Outcome Measure Help needed turning from your back to your side while in a flat bed without using bedrails?: None Help needed moving from lying on your back to sitting on the side of a flat bed without using bedrails?: None Help needed moving to and from a bed to a chair (including a wheelchair)?: None Help needed standing up from a chair using your arms (e.g., wheelchair or bedside chair)?: None Help needed to walk in hospital room?: None Help needed climbing 3-5 steps with a railing? : None 6 Click Score: 24    End of Session   Activity Tolerance: Patient tolerated treatment well Patient left: in bed;with call bell/phone within reach;with nursing/sitter in room Nurse Communication: Mobility status PT Visit Diagnosis: Muscle weakness (generalized) (M62.81)    Time: 7322-0254 PT Time Calculation (min) (ACUTE ONLY): 9 min   Charges:   PT Evaluation $PT Eval Low Complexity: 1 Low          Nandika Stetzer A. Gilford Rile PT, DPT Acute Rehabilitation Services Pager [phone] Office [phone]   Linna Hoff 08/08/2021, 11:23 AM

## 2021-08-08 NOTE — Discharge Summary (Signed)
Physician Discharge Summary   Patient: John [name] MRN: [id number] DOB: 04/13/1970  Admit date:     08/07/2021  Discharge date: 08/08/21  Discharge Physician: Cordelia Poche, MD   PCP: Pcp, No   Recommendations at discharge:  Follow-up with PCP and ID clinic  Discharge Diagnoses: Principal Problem:   Acute asthma exacerbation Active Problems:   Hip pain   HIV disease (HCC)   Cocaine use disorder, severe, dependence (HCC)   Tobacco use disorder   Bipolar disorder, curr episode mixed, severe, with psychotic features ([place])   Marijuana dependence ([place])  Resolved Problems:   * No resolved hospital problems. *  Admission HPI: John [name] is a 51 y.o. male with medical history significant of polysubstance abuse; asthma; HIV; bipolar d/o; homelessness; and HTN presenting with SOB. For the last few days, his asthma has been worsening.   He started having SOB about 3-4 days ago.  He feels like he can't get enough air in.  +cough, productive of gold-gray sputum.  No fevers or sick contacts.  Not on home O2.  He is smoking up to 11 cigs/day.  He has been having L hip pain - told it is sciatica, the pain is sharp behind his hip bone and the pain shoots down his leg and he has difficulty walking.  Hip pain x 4-5 months, gradually worsening.  He was seen in the ER and told it was sciatica.  Assessment and Plan:  Asthma vs COPD exacerbation Patient presented with dyspnea and cough. He received steroids and breathing treatments. Symptoms resolved quickly. Discharge on albuterol and Dulera. No steroids provided on discharge.  Hip pain Hip x-ray negative for fracture. Possible sciatica. Gabapentin provided on discharge.  HIV Restart Biktarvy. Prescription provided.  Bipolar disorder Per psychiatry, patient does not have bipolar disorder. Psychiatry, Dr. Dwyane Dee, recommended to discontinue Buspar and Seroquel.  Polysubstance abuse Patient declining substance use resources.  Tobacco  dependence Cessation encouraged.   Consultants: Psychiatry Procedures performed: None  Disposition:  [facility] house Diet recommendation: Regular diet DISCHARGE MEDICATION: Allergies as of 08/08/2021       Reactions   Shellfish Allergy Anaphylaxis, Swelling        Medication List     STOP taking these medications    busPIRone 5 MG tablet Commonly known as: BUSPAR   mirtazapine 15 MG tablet Commonly known as: REMERON   QUEtiapine 100 MG tablet Commonly known as: SEROQUEL   traZODone 100 MG tablet Commonly known as: DESYREL       TAKE these medications    acetaminophen 325 MG tablet Commonly known as: TYLENOL Take 2 tablets (650 mg total) by mouth every 6 (six) hours as needed for mild pain (or Fever >/= 101).   albuterol 108 (90 Base) MCG/ACT inhaler Commonly known as: VENTOLIN HFA Inhale 2 puffs into the lungs every 4 (four) hours as needed for wheezing or shortness of breath.   Biktarvy 50-200-25 MG Tabs tablet Generic drug: bictegravir-emtricitabine-tenofovir AF Take 1 tablet by mouth daily.   Dulera 100-5 MCG/ACT Aero Generic drug: mometasone-formoterol Inhale 2 puffs into the lungs 2 (two) times daily.   feeding supplement Liqd Take 237 mLs by mouth 2 (two) times daily between meals.   gabapentin 100 MG capsule Commonly known as: NEURONTIN Take 1 capsule (100 mg total) by mouth 3 (three) times daily.        Follow-up Information     [place], FNP Follow up on 08/26/2021.   Specialty: Infectious Diseases Why: Hospital Discharge  Follow Up 2:45 pm appointment. Contact information: [place] [address] [phone]         Medicaid Transportation. Call.   Why: Call DSS transportation to complete assessment to be able to use Medicaid for medical appointments. Contact information: [phone]               Discharge Exam: BP 128/79 (BP Location: Right Arm)   Pulse 78   Temp 98.2 F (36.8 C)  (Oral)   Resp 18   Ht '5\' 5"'$  (1.651 m)   Wt 55.3 kg   SpO2 99%   BMI 20.29 kg/m   General exam: Appears calm and comfortable Respiratory system: Clear to auscultation. Respiratory effort normal. Cardiovascular system: S1 & S2 heard, RRR. No murmurs, rubs, gallops or clicks. Gastrointestinal system: Abdomen is nondistended, soft and nontender. Normal bowel sounds heard. Central nervous system: Alert and oriented. No focal neurological deficits. Musculoskeletal: No edema. No calf tenderness Skin: No cyanosis. No rashes Psychiatry: Judgement and insight appear normal. Mood & affect appropriate.   Condition at discharge: stable  The results of significant diagnostics from this hospitalization (including imaging, microbiology, ancillary and laboratory) are listed below for reference.   Imaging Studies: DG Lumbar Spine 2-3 Views  Result Date: 08/07/2021 CLINICAL DATA:  Sciatic pain left side and lower back pain EXAM: LUMBAR SPINE - 2-3 VIEW COMPARISON:  Lumbar spine MRI 12/19/2019 FINDINGS: There are 5 non-rib-bearing lumbar vertebrae. There is mild multilevel degenerative disc disease, worst at L4-L5. No static listhesis. There is mild lower lumbar predominant facet arthropathy. No evidence of lumbar spine fracture. IMPRESSION: Mild multilevel degenerative disc disease worst at L4-L5 and L5-S1. Mild lower lumbar predominant facet arthropathy. No evidence of lumbar spine fracture. Electronically Signed   By: Maurine Simmering M.D.   On: 08/07/2021 11:24   CT Chest Wo Contrast  Result Date: 08/07/2021 CLINICAL DATA:  Nonspecific chest pain with shortness of breath EXAM: CT CHEST WITHOUT CONTRAST TECHNIQUE: Multidetector CT imaging of the chest was performed following the standard protocol without IV contrast. RADIATION DOSE REDUCTION: This exam was performed according to the departmental dose-optimization program which includes automated exposure control, adjustment of the mA and/or kV according to  patient size and/or use of iterative reconstruction technique. COMPARISON:  11/13/2014 FINDINGS: Cardiovascular: Normal heart size. No pericardial effusion. No acute vascular finding without contrast Mediastinum/Nodes: Negative for adenopathy or mass. Lungs/Pleura: Large lung volumes with generalized airway thickening. Paraseptal emphysema at the apices. There is no edema, consolidation, effusion, or pneumothorax. No worrisome pulmonary nodule. Focal mild scarring along the upper left major fissure. Upper Abdomen: Full stomach by semi-solid material, presumably from recent meal. Musculoskeletal: No acute or aggressive finding IMPRESSION: 1. No acute finding. 2. Hyperinflation and generalized airway thickening. 3. Full stomach. Electronically Signed   By: Jorje Guild M.D.   On: 08/07/2021 07:24   DG Chest Portable 1 View  Result Date: 08/07/2021 CLINICAL DATA:  Shortness of breath. EXAM: PORTABLE CHEST 1 VIEW COMPARISON:  05/07/2021 FINDINGS: 0547 hours. Lungs are hyperexpanded. The lungs are clear without focal pneumonia, edema, pneumothorax or pleural effusion. Probable bronchiectasis right parahilar lung. Linear lucency overlies the upper mediastinum. The cardiopericardial silhouette is within normal limits for size. Telemetry leads overlie the chest. IMPRESSION: 1. Linear lucency overlying the upper mediastinum raises the question of pneumomediastinum although this could be projectional. Consider dedicated upright PA and lateral chest x-ray or chest CT to further evaluate. 2. Hyperexpansion with interval resolution of the basilar  predominant interstitial and patchy airspace disease noted on the prior study. Electronically Signed   By: Misty Stanley M.D.   On: 08/07/2021 05:59   DG HIP UNILAT WITH PELVIS 2-3 VIEWS LEFT  Result Date: 08/07/2021 CLINICAL DATA:  Sciatic pain left side EXAM: DG HIP (WITH OR WITHOUT PELVIS) 2-3V LEFT COMPARISON:  None Available. FINDINGS: There is no evidence of acute  fracture. There are minimal degenerative changes of the hips. Minimal pubic symphysis and bilateral SI joint degenerative change. IMPRESSION: No acute osseous abnormality. Minimal degenerative changes of the left hip. Electronically Signed   By: Maurine Simmering M.D.   On: 08/07/2021 11:22    Microbiology: Results for orders placed or performed during the hospital encounter of 08/07/21  SARS Coronavirus 2 by RT PCR (hospital order, performed in [facility] hospital lab) *cepheid single result test* Anterior Nasal Swab     Status: None   Collection Time: 08/07/21  5:41 AM   Specimen: Anterior Nasal Swab  Result Value Ref Range Status   SARS Coronavirus 2 by RT PCR NEGATIVE NEGATIVE Final    Comment: (NOTE) SARS-CoV-2 target nucleic acids are NOT DETECTED.  The SARS-CoV-2 RNA is generally detectable in upper and lower respiratory specimens during the acute phase of infection. The lowest concentration of SARS-CoV-2 viral copies this assay can detect is 250 copies / mL. A negative result does not preclude SARS-CoV-2 infection and should not be used as the sole basis for treatment or other patient management decisions.  A negative result may occur with improper specimen collection / handling, submission of specimen other than nasopharyngeal swab, presence of viral mutation(s) within the areas targeted by this assay, and inadequate number of viral copies (<250 copies / mL). A negative result must be combined with clinical observations, patient history, and epidemiological information.  Fact Sheet for Patients:   https://www.patel.info/  Fact Sheet for Healthcare Providers: https://hall.com/  This test is not yet approved or  cleared by the [place] FDA and has been authorized for detection and/or diagnosis of SARS-CoV-2 by FDA under an Emergency Use Authorization (EUA).  This EUA will remain in effect (meaning this test can be used) for the  duration of the COVID-19 declaration under Section 564(b)(1) of the Act, 21 U.S.C. section 360bbb-3(b)(1), unless the authorization is terminated or revoked sooner.  Performed at [facility], [place] [address], [place], [place] [address]     Labs: CBC: Recent Labs  Lab 08/07/21 0525 08/07/21 0601  WBC 5.4  --   NEUTROABS 1.7  --   HGB 14.1 14.6  HCT 43.5 43.0  MCV 95.0  --   PLT 375  --    Basic Metabolic Panel: Recent Labs  Lab 08/07/21 0601  NA 141  K 4.5  CL 106  GLUCOSE 72  BUN 17  CREATININE 1.00    Discharge time spent:  35 minutes.  Signed: Cordelia Poche, MD [facility] 08/08/2021

## 2021-08-08 NOTE — Progress Notes (Signed)
DISCHARGE NOTE HOME John [name] to be discharged Home per MD order. Discussed prescriptions and follow up appointments with the patient. Prescriptions given to patient; medication list explained in detail. Patient verbalized understanding.  Skin clean, dry and intact without evidence of skin break down, no evidence of skin tears noted. IV catheter discontinued intact. Site without signs and symptoms of complications. Dressing and pressure applied. Pt denies pain at the site currently. No complaints noted.  Patient free of lines, drains, and wounds.   An After Visit Summary (AVS) was printed and given to the patient. Patient escorted via, wheelchair and discharged home via [employer].  Ellwood Sayers, RN

## 2021-08-08 NOTE — Evaluation (Signed)
Occupational Therapy Evaluation Patient Details Name: John [name] MRN: [id number] DOB: 22-Jan-1971 Today's Date: 08/08/2021   History of Present Illness 51 y/o male presented to ED on 08/07/21 for complaints of SOB. Admitted for asthma/COPD exacerbation. PMH: HIV, cocaine use disorder, bipolar, schizophrenia, marijuana dependence, HTN, COPD   Clinical Impression   Pt admitted for concerns listed above. PTA pt reported that he was independent with all ADL's and IADL's, including walking to his MD appointments. At this time, pt presents with increased pain in his L hip/buttocks region, causing an unstable gait at times. He is able to complete BADL's and functional mobility within short distances. At this time, he has no further skilled OT needs and acute Ot will sign off.       Recommendations for follow up therapy are one component of a multi-disciplinary discharge planning process, led by the attending physician.  Recommendations may be updated based on patient status, additional functional criteria and insurance authorization.   Follow Up Recommendations  No OT follow up    Assistance Recommended at Discharge PRN  Patient can return home with the following Assist for transportation;Assistance with cooking/housework    Functional Status Assessment  Patient has had a recent decline in their functional status and demonstrates the ability to make significant improvements in function in a reasonable and predictable amount of time.  Equipment Recommendations  None recommended by OT    Recommendations for Other Services       Precautions / Restrictions Precautions Precautions: None Restrictions Weight Bearing Restrictions: No      Mobility Bed Mobility Overal bed mobility: Modified Independent             General bed mobility comments: no assist needed to sit up from a flat bed    Transfers Overall transfer level: Modified independent Equipment used: None                General transfer comment: increased time to complete from bed and toilet      Balance Overall balance assessment: Mild deficits observed, not formally tested                                         ADL either performed or assessed with clinical judgement   ADL Overall ADL's : At baseline                                       General ADL Comments: Pt overall at his baseline, limited mildly by pain in LLE, however able to complete needed BADL's and functional mobility     Vision Baseline Vision/History: 0 No visual deficits Ability to See in Adequate Light: 0 Adequate Patient Visual Report: No change from baseline Vision Assessment?: No apparent visual deficits     Perception     Praxis      Pertinent Vitals/Pain Pain Assessment Pain Assessment: Faces Faces Pain Scale: Hurts even more Pain Location: L calf Pain Descriptors / Indicators: Discomfort, Cramping, Grimacing Pain Intervention(s): Monitored during session     Hand Dominance Right   Extremity/Trunk Assessment Upper Extremity Assessment Upper Extremity Assessment: Overall WFL for tasks assessed   Lower Extremity Assessment Lower Extremity Assessment: Defer to PT evaluation LLE Deficits / Details: inconsistent performance for MMT. From performance, grossly 4-/5   Cervical / Trunk Assessment  Cervical / Trunk Assessment: Normal   Communication Communication Communication: No difficulties   Cognition Arousal/Alertness: Awake/alert Behavior During Therapy: WFL for tasks assessed/performed Overall Cognitive Status: History of cognitive impairments - at baseline                                 General Comments: hx of bipolar and schizophrenia at baseline     General Comments  VSS on RA    Exercises     Shoulder Instructions      Home Living Family/patient expects to be discharged to:: Shelter/Homeless                                  Additional Comments: lives in boarding house with 4 other men      Prior Functioning/Environment Prior Level of Function : Independent/Modified Independent                        OT Problem List: Decreased strength;Impaired balance (sitting and/or standing);Pain;Decreased cognition;Decreased safety awareness      OT Treatment/Interventions:      OT Goals(Current goals can be found in the care plan section) Acute Rehab OT Goals Patient Stated Goal: None stated OT Goal Formulation: With patient Time For Goal Achievement: 08/08/21 Potential to Achieve Goals: Good  OT Frequency:      Co-evaluation              AM-PAC OT "6 Clicks" Daily Activity     Outcome Measure Help from another person eating meals?: None Help from another person taking care of personal grooming?: None Help from another person toileting, which includes using toliet, bedpan, or urinal?: None Help from another person bathing (including washing, rinsing, drying)?: None Help from another person to put on and taking off regular upper body clothing?: None Help from another person to put on and taking off regular lower body clothing?: None 6 Click Score: 24   End of Session Nurse Communication: Mobility status  Activity Tolerance: Patient tolerated treatment well Patient left: in bed;with nursing/sitter in room  OT Visit Diagnosis: Unsteadiness on feet (R26.81);Other abnormalities of gait and mobility (R26.89);Muscle weakness (generalized) (M62.81);Pain Pain - Right/Left: Left Pain - part of body: Hip;Leg                Time: 5956-3875 OT Time Calculation (min): 10 min Charges:  OT General Charges $OT Visit: 1 Visit OT Evaluation $OT Eval Low Complexity: [place]., OTR/L Acute Rehabilitation  Elver Stadler Elane Yolanda Bonine 08/08/2021, 12:18 PM

## 2021-08-09 LAB — RPR: RPR Ser Ql: NONREACTIVE

## 2021-08-11 LAB — CYTOLOGY - NON PAP
Chlamydia: NEGATIVE
Comment: NEGATIVE
Comment: NORMAL
Neisseria Gonorrhea: NEGATIVE

## 2021-08-11 LAB — T-HELPER CELLS (CD4) COUNT (NOT AT ARMC)
CD4 % Helper T Cell: 20 % — ABNORMAL LOW (ref 33–65)
CD4 T Cell Abs: 92 /uL — ABNORMAL LOW (ref 400–1790)

## 2021-08-12 LAB — QUANTIFERON-TB GOLD PLUS (RQFGPL)
QuantiFERON Mitogen Value: 3.27 IU/mL
QuantiFERON Nil Value: 0 IU/mL
QuantiFERON TB1 Ag Value: 0.13 IU/mL
QuantiFERON TB2 Ag Value: 0.16 IU/mL

## 2021-08-12 LAB — QUANTIFERON-TB GOLD PLUS: QuantiFERON-TB Gold Plus: NEGATIVE

## 2021-08-26 ENCOUNTER — Inpatient Hospital Stay: Payer: Medicaid Other | Admitting: Family

## 2021-09-02 ENCOUNTER — Other Ambulatory Visit: Payer: Self-pay

## 2021-09-02 ENCOUNTER — Encounter (HOSPITAL_COMMUNITY): Payer: Self-pay

## 2021-09-02 ENCOUNTER — Emergency Department (HOSPITAL_COMMUNITY)
Admission: EM | Admit: 2021-09-02 | Discharge: 2021-09-03 | Disposition: A | Discharge status: Home / Self Care | Payer: Medicaid Other | Attending: Emergency Medicine | Admitting: Emergency Medicine

## 2021-09-02 ENCOUNTER — Ambulatory Visit (HOSPITAL_COMMUNITY)
Admission: EM | Admit: 2021-09-02 | Discharge: 2021-09-02 | Payer: Medicaid Other | Attending: Psychiatry | Admitting: Psychiatry

## 2021-09-02 ENCOUNTER — Emergency Department (HOSPITAL_COMMUNITY): Payer: Medicaid Other

## 2021-09-02 DIAGNOSIS — F333 Major depressive disorder, recurrent, severe with psychotic symptoms: Secondary | ICD-10-CM | POA: Insufficient documentation

## 2021-09-02 DIAGNOSIS — R45850 Homicidal ideations: Secondary | ICD-10-CM

## 2021-09-02 DIAGNOSIS — Z9152 Personal history of nonsuicidal self-harm: Secondary | ICD-10-CM | POA: Insufficient documentation

## 2021-09-02 DIAGNOSIS — F259 Schizoaffective disorder, unspecified: Secondary | ICD-10-CM | POA: Diagnosis not present

## 2021-09-02 DIAGNOSIS — Z21 Asymptomatic human immunodeficiency virus [HIV] infection status: Secondary | ICD-10-CM | POA: Insufficient documentation

## 2021-09-02 DIAGNOSIS — R45851 Suicidal ideations: Secondary | ICD-10-CM

## 2021-09-02 DIAGNOSIS — U071 COVID-19: Secondary | ICD-10-CM | POA: Insufficient documentation

## 2021-09-02 DIAGNOSIS — F419 Anxiety disorder, unspecified: Secondary | ICD-10-CM | POA: Insufficient documentation

## 2021-09-02 DIAGNOSIS — Z046 Encounter for general psychiatric examination, requested by authority: Secondary | ICD-10-CM | POA: Diagnosis present

## 2021-09-02 DIAGNOSIS — Z9151 Personal history of suicidal behavior: Secondary | ICD-10-CM | POA: Insufficient documentation

## 2021-09-02 LAB — CBC WITH DIFFERENTIAL/PLATELET
Abs Immature Granulocytes: 0 10*3/uL (ref 0.00–0.07)
Basophils Absolute: 0.2 10*3/uL — ABNORMAL HIGH (ref 0.0–0.1)
Basophils Relative: 5 %
Eosinophils Absolute: 0.1 10*3/uL (ref 0.0–0.5)
Eosinophils Relative: 3 %
HCT: 47.4 % (ref 39.0–52.0)
Hemoglobin: 15.6 g/dL (ref 13.0–17.0)
Lymphocytes Relative: 54 %
Lymphs Abs: 1.6 10*3/uL (ref 0.7–4.0)
MCH: 31.3 pg (ref 26.0–34.0)
MCHC: 32.9 g/dL (ref 30.0–36.0)
MCV: 95.2 fL (ref 80.0–100.0)
Monocytes Absolute: 0.4 10*3/uL (ref 0.1–1.0)
Monocytes Relative: 13 %
Myelocytes: 1 %
Neutro Abs: 0.7 10*3/uL — ABNORMAL LOW (ref 1.7–7.7)
Neutrophils Relative %: 24 %
Platelets: 314 10*3/uL (ref 150–400)
RBC: 4.98 MIL/uL (ref 4.22–5.81)
RDW: 12.3 % (ref 11.5–15.5)
WBC: 3 10*3/uL — ABNORMAL LOW (ref 4.0–10.5)
nRBC: 0 % (ref 0.0–0.2)
nRBC: 0 /100 WBC

## 2021-09-02 LAB — RESP PANEL BY RT-PCR (FLU A&B, COVID) ARPGX2
Influenza A by PCR: NEGATIVE
Influenza B by PCR: NEGATIVE
SARS Coronavirus 2 by RT PCR: POSITIVE — AB

## 2021-09-02 LAB — COMPREHENSIVE METABOLIC PANEL
ALT: 20 U/L (ref 0–44)
AST: 33 U/L (ref 15–41)
Albumin: 3.5 g/dL (ref 3.5–5.0)
Alkaline Phosphatase: 81 U/L (ref 38–126)
Anion gap: 13 (ref 5–15)
BUN: 22 mg/dL — ABNORMAL HIGH (ref 6–20)
CO2: 24 mmol/L (ref 22–32)
Calcium: 8.8 mg/dL — ABNORMAL LOW (ref 8.9–10.3)
Chloride: 102 mmol/L (ref 98–111)
Creatinine, Ser: 0.77 mg/dL (ref 0.61–1.24)
GFR, Estimated: >60 mL/min (ref >60)
Glucose, Bld: 92 mg/dL (ref 70–99)
Potassium: 4.5 mmol/L (ref 3.5–5.1)
Sodium: 139 mmol/L (ref 135–145)
Total Bilirubin: 0.6 mg/dL (ref 0.3–1.2)
Total Protein: 6.5 g/dL (ref 6.5–8.1)

## 2021-09-02 LAB — ETHANOL: Alcohol, Ethyl (B): <10 mg/dL (ref <10)

## 2021-09-02 MED ORDER — ACETAMINOPHEN 325 MG PO TABS: 650.0000 mg | ORAL_TABLET | Freq: Four times a day (QID) | ORAL | Status: DC | PRN

## 2021-09-02 MED ORDER — BICTEGRAVIR-EMTRICITAB-TENOFOV 50-200-25 MG PO TABS: 1.0000 | ORAL_TABLET | Freq: Every day | ORAL | Status: DC

## 2021-09-02 MED ORDER — ZIPRASIDONE MESYLATE 20 MG IM SOLR: 20.0000 mg | INTRAMUSCULAR | Status: DC | PRN

## 2021-09-02 MED ORDER — GABAPENTIN 100 MG PO CAPS: 100.0000 mg | ORAL_CAPSULE | Freq: Three times a day (TID) | ORAL | Status: DC

## 2021-09-02 MED ORDER — BICTEGRAVIR-EMTRICITAB-TENOFOV 50-200-25 MG PO TABS
1.0000 | ORAL_TABLET | Freq: Every day | ORAL | Status: DC
Start: 2021-09-02 — End: 2021-09-03
  Administered 2021-09-02 – 2021-09-03 (×2): 1 via ORAL
  Filled 2021-09-02 (×2): qty 1

## 2021-09-02 MED ORDER — MAGNESIUM HYDROXIDE 400 MG/5ML PO SUSP: 30.0000 mL | Freq: Every day | ORAL | Status: DC | PRN

## 2021-09-02 MED ORDER — ALBUTEROL SULFATE HFA 108 (90 BASE) MCG/ACT IN AERS
1.0000 | INHALATION_SPRAY | Freq: Four times a day (QID) | RESPIRATORY_TRACT | Status: DC | PRN
  Administered 2021-09-02: 1 via RESPIRATORY_TRACT
  Filled 2021-09-02: qty 6.7

## 2021-09-02 MED ORDER — ALUM & MAG HYDROXIDE-SIMETH 200-200-20 MG/5ML PO SUSP: 30.0000 mL | ORAL | Status: DC | PRN

## 2021-09-02 MED ORDER — OLANZAPINE 5 MG PO TBDP: 5.0000 mg | ORAL_TABLET | Freq: Three times a day (TID) | ORAL | Status: DC | PRN

## 2021-09-02 MED ORDER — MOMETASONE FURO-FORMOTEROL FUM 100-5 MCG/ACT IN AERO: 2.0000 | INHALATION_SPRAY | Freq: Two times a day (BID) | RESPIRATORY_TRACT | Status: DC

## 2021-09-02 MED ORDER — LORAZEPAM 1 MG PO TABS: 1.0000 mg | ORAL_TABLET | ORAL | Status: DC | PRN

## 2021-09-02 MED ORDER — QUETIAPINE FUMARATE 100 MG PO TABS: 100.0000 mg | ORAL_TABLET | Freq: Every evening | ORAL | Status: DC | PRN

## 2021-09-02 MED ORDER — ALBUTEROL SULFATE HFA 108 (90 BASE) MCG/ACT IN AERS: 2.0000 | INHALATION_SPRAY | RESPIRATORY_TRACT | Status: DC | PRN

## 2021-09-02 MED ORDER — QUETIAPINE FUMARATE 100 MG PO TABS
100.0000 mg | ORAL_TABLET | Freq: Every evening | ORAL | Status: DC | PRN
  Administered 2021-09-03: 100 mg via ORAL
  Filled 2021-09-02: qty 1

## 2021-09-02 NOTE — ED Triage Notes (Signed)
Pt bib ems due to a [facility] transfer. [facility] states pt was covid positive so they sent him over here for inpatient tx. Pt states he is suicidal for the past 2.5 months. Axo4.

## 2021-09-02 NOTE — ED Provider Notes (Addendum)
The patient is considered medically cleared for psychiatric treatment.  I spoke with infectious disease and told them about the patient's positive COVID test and absolutely no symptoms.  Infectious disease reviewed his history and lab work that was back and stated that there was no need to treat the positive COVID test and the patient is medically cleared to go to psych.  Also there is absolutely no need for isolation for this patient.  Patient had a history of a positive COVID test once before over a year ago.   Milton Ferguson, MD 09/02/21 2050    Milton Ferguson, MD 09/02/21 2107

## 2021-09-02 NOTE — ED Provider Notes (Signed)
[facility] EMERGENCY DEPARTMENT Provider Note   CSN: [id number] Arrival date & time: 09/02/21  1647     History  Chief Complaint  Patient presents with   Psychiatric Evaluation    John [name] is a 51 y.o. male.  Patient has a history of HIV.  He is suicidal for the last 3 months and was sent over here from behavioral health because he had a positive COVID test.  Patient has no respiratory symptoms  The history is provided by the patient and medical records. No language interpreter was used.  Altered Mental Status Presenting symptoms: behavior changes   Severity:  Severe Most recent episode:  More than 2 days ago Episode history:  Continuous Timing:  Constant Progression:  Worsening Chronicity:  Recurrent Context: alcohol use   Associated symptoms: agitation   Associated symptoms: no abdominal pain, no hallucinations, no headaches, no rash and no seizures        Home Medications Prior to Admission medications   Medication Sig Start Date End Date Taking? Authorizing Provider  albuterol (VENTOLIN HFA) 108 (90 Base) MCG/ACT inhaler Inhale 2 puffs into the lungs every 4 (four) hours as needed for wheezing or shortness of breath. 08/08/21   Mariel Aloe, MD  bictegravir-emtricitabine-tenofovir AF (BIKTARVY) 50-200-25 MG TABS tablet Take 1 tablet by mouth daily. 08/08/21 11/06/21  Mariel Aloe, MD  busPIRone (BUSPAR) 5 MG tablet Take 7.5 mg by mouth 2 (two) times daily as needed (For anxiety).    [provider]  feeding supplement (ENSURE ENLIVE / ENSURE PLUS) LIQD Take 237 mLs by mouth 2 (two) times daily between meals. 08/08/21   Mariel Aloe, MD  gabapentin (NEURONTIN) 100 MG capsule Take 1 capsule (100 mg total) by mouth 3 (three) times daily. 08/08/21 09/07/21  Mariel Aloe, MD  mirtazapine (REMERON) 15 MG tablet Take 15 mg by mouth at bedtime as needed (For sleep).    [provider]  mometasone-formoterol (DULERA) 100-5  MCG/ACT AERO Inhale 2 puffs into the lungs 2 (two) times daily. 08/08/21   Mariel Aloe, MD  QUEtiapine (SEROQUEL) 100 MG tablet Take 100 mg by mouth at bedtime as needed (For sleep).    [provider]  traZODone (DESYREL) 100 MG tablet Take 100 mg by mouth at bedtime as needed for sleep.    [provider]  dicyclomine (BENTYL) 20 MG tablet Take 1 tablet (20 mg total) by mouth 2 (two) times daily. 01/08/17 01/03/20  Palumbo, April, MD  sucralfate (CARAFATE) 1 GM/10ML suspension Take 10 mLs (1 g total) by mouth 4 (four) times daily -  with meals and at bedtime. Patient not taking: Reported on 10/21/2017 01/08/17 09/03/18  Palumbo, April, MD      Allergies    Shellfish allergy    Review of Systems   Review of Systems  Constitutional:  Negative for appetite change and fatigue.  HENT:  Negative for congestion, ear discharge and sinus pressure.   Eyes:  Negative for discharge.  Respiratory:  Negative for cough.   Cardiovascular:  Negative for chest pain.  Gastrointestinal:  Negative for abdominal pain and diarrhea.  Genitourinary:  Negative for frequency and hematuria.  Musculoskeletal:  Negative for back pain.  Skin:  Negative for rash.  Neurological:  Negative for seizures and headaches.  Psychiatric/Behavioral:  Positive for agitation. Negative for hallucinations.        Suicidal    Physical Exam Updated Vital Signs BP 135/87 (BP Location: Right Arm)  Pulse 80   Temp 98.3 F (36.8 C) (Oral)   Resp 18   SpO2 97%  Physical Exam Vitals and nursing note reviewed.  Constitutional:      Appearance: He is well-developed.  HENT:     Head: Normocephalic.     Nose: Nose normal.  Eyes:     General: No scleral icterus.    Conjunctiva/sclera: Conjunctivae normal.  Neck:     Thyroid: No thyromegaly.  Cardiovascular:     Rate and Rhythm: Normal rate and regular rhythm.     Heart sounds: No murmur heard.    No friction rub. No gallop.  Pulmonary:     Breath  sounds: No stridor. No wheezing or rales.  Chest:     Chest wall: No tenderness.  Abdominal:     General: There is no distension.     Tenderness: There is no abdominal tenderness. There is no rebound.  Musculoskeletal:        General: Normal range of motion.     Cervical back: Neck supple.  Lymphadenopathy:     Cervical: No cervical adenopathy.  Skin:    Findings: No erythema or rash.  Neurological:     Mental Status: He is alert and oriented to person, place, and time.     Motor: No abnormal muscle tone.     Coordination: Coordination normal.  Psychiatric:     Comments: Suicidal     ED Results / Procedures / Treatments   Labs (all labs ordered are listed, but only abnormal results are displayed) Labs Reviewed  CBC WITH DIFFERENTIAL/PLATELET - Abnormal; Notable for the following components:      Result Value   WBC 3.0 (*)    Neutro Abs 0.7 (*)    Basophils Absolute 0.2 (*)    All other components within normal limits  COMPREHENSIVE METABOLIC PANEL - Abnormal; Notable for the following components:   BUN 22 (*)    Calcium 8.8 (*)    All other components within normal limits  ETHANOL  RAPID URINE DRUG SCREEN, HOSP PERFORMED  T-HELPER CELLS (CD4) COUNT (NOT AT [facility])  HIV-1 RNA QUANT-NO REFLEX-BLD  PATHOLOGIST SMEAR REVIEW    EKG None  Radiology DG Chest Port 1 View  Result Date: 09/02/2021 CLINICAL DATA:  Weakness, COVID positive EXAM: PORTABLE CHEST 1 VIEW COMPARISON:  Chest radiograph and CT 08/07/2021 FINDINGS: The cardiomediastinal silhouette is normal. The lungs are clear, with no focal consolidation or pulmonary edema. There is no pleural effusion or pneumothorax There is no acute osseous abnormality. There is a remote fracture of a left lower rib, unchanged. IMPRESSION: Stable chest with no radiographic evidence of acute cardiopulmonary process. Electronically Signed   By: Valetta Mole M.D.   On: 09/02/2021 17:45    Procedures Procedures    Medications  Ordered in ED Medications  albuterol (VENTOLIN HFA) 108 (90 Base) MCG/ACT inhaler 1 puff (1 puff Inhalation Given 09/02/21 2024)  bictegravir-emtricitabine-tenofovir AF (BIKTARVY) 50-200-25 MG per tablet 1 tablet (has no administration in time range)  QUEtiapine (SEROQUEL) tablet 100 mg (has no administration in time range)    ED Course/ Medical Decision Making/ A&P  CRITICAL CARE Performed by: Milton Ferguson Total critical care time: 40 minutes Critical care time was exclusive of separately billable procedures and treating other patients. Critical care was necessary to treat or prevent imminent or life-threatening deterioration. Critical care was time spent personally by me on the following activities: development of treatment plan with patient and/or surrogate as well as  nursing, discussions with consultants, evaluation of patient's response to treatment, examination of patient, obtaining history from patient or surrogate, ordering and performing treatments and interventions, ordering and review of laboratory studies, ordering and review of radiographic studies, pulse oximetry and re-evaluation of patient's condition.     As stated in the blank note, I spoke with infectious disease and they stated there was no need to treat this positive COVID and no need for the patient to be on isolation                         Medical Decision Making Amount and/or Complexity of Data Reviewed Labs: ordered. Radiology: ordered.  Risk Prescription drug management.  This patient presents to the ED for concern of suicidal, this involves an extensive number of treatment options, and is a complaint that carries with it a high risk of complications and morbidity.  The differential diagnosis includes chronic suicidal ideation   Co morbidities that complicate the patient evaluation  HIV positive, positive COVID test   Additional history obtained:  Additional history obtained from behavioral  health External records from outside source obtained and reviewed including hospital records   Lab Tests:  I Ordered, and personally interpreted labs.  The pertinent results include: CBC which shows mild low white count 3.0 and chemistries unremarkable.  COVID test done at behavior health was positive   Imaging Studies ordered:  I ordered imaging studies including chest x-ray I independently visualized and interpreted imaging which showed negative I agree with the radiologist interpretation   Cardiac Monitoring: / EKG:  The patient was maintained on a cardiac monitor.  I personally viewed and interpreted the cardiac monitored which showed an underlying rhythm of: Normal sinus rhythm   Consultations Obtained:  I requested consultation with the infectious disease,  and discussed lab and imaging findings as well as pertinent plan - they recommend: No treatment for the COVID-positive and no isolation   Problem List / ED Course / Critical interventions / Medication management  Suicidal ideations and HIV positive and COVID-positive I ordered medication including HIV medications started back Reevaluation of the patient after these medicines showed that the patient stayed the same I have reviewed the patients home medicines and have made adjustments as needed   Social Determinants of Health:  Homeless   Test / Admission - Considered:  CD4 count pending  Patient is suicidal and medically cleared.  With a positive COVID test but no need for treatment or isolation        Final Clinical Impression(s) / ED Diagnoses Final diagnoses:  None    Rx / DC Orders ED Discharge Orders     None         Milton Ferguson, MD 09/02/21 2119

## 2021-09-02 NOTE — ED Notes (Signed)
Pt changed into purple scrubs, Belongings inventoried and put into locker #11. Cell phone given to security and pt medications, 2 inhalers, sent to pharmacy.
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
## 2021-09-02 NOTE — ED Notes (Signed)
RN called security to wand pt. Pt being changed out in purple scrubs
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
## 2021-09-03 ENCOUNTER — Other Ambulatory Visit: Payer: Self-pay

## 2021-09-03 ENCOUNTER — Other Ambulatory Visit (HOSPITAL_COMMUNITY): Payer: Self-pay

## 2021-09-03 DIAGNOSIS — F259 Schizoaffective disorder, unspecified: Secondary | ICD-10-CM

## 2021-09-03 DIAGNOSIS — R45851 Suicidal ideations: Secondary | ICD-10-CM

## 2021-09-03 LAB — T-HELPER CELLS (CD4) COUNT (NOT AT ARMC)
CD4 % Helper T Cell: 35 % (ref 33–65)
CD4 T Cell Abs: 568 /uL (ref 400–1790)

## 2021-09-03 MED ORDER — BUSPIRONE HCL 10 MG PO TABS
5.0000 mg | ORAL_TABLET | Freq: Two times a day (BID) | ORAL | Status: DC
Start: 2021-09-03 — End: 2021-09-03
  Administered 2021-09-03: 5 mg via ORAL
  Filled 2021-09-03: qty 1

## 2021-09-03 MED ORDER — MIRTAZAPINE 15 MG PO TABS
15.0000 mg | ORAL_TABLET | Freq: Every evening | ORAL | 0 refills | Status: DC | PRN
  Filled 2021-09-03: qty 7, 7d supply, fill #0

## 2021-09-03 MED ORDER — QUETIAPINE FUMARATE 100 MG PO TABS
100.0000 mg | ORAL_TABLET | Freq: Every evening | ORAL | 0 refills | Status: DC | PRN
  Filled 2021-09-03: qty 7, 7d supply, fill #0

## 2021-09-03 MED ORDER — QUETIAPINE FUMARATE 100 MG PO TABS
100.0000 mg | ORAL_TABLET | Freq: Every day | ORAL | Status: DC
Start: 2021-09-03 — End: 2021-09-03

## 2021-09-03 MED ORDER — BUSPIRONE HCL 5 MG PO TABS
5.0000 mg | ORAL_TABLET | Freq: Two times a day (BID) | ORAL | 0 refills | Status: DC
  Filled 2021-09-03: qty 14, 7d supply, fill #0

## 2021-09-03 MED ORDER — MIRTAZAPINE 15 MG PO TABS
15.0000 mg | ORAL_TABLET | Freq: Every evening | ORAL | Status: DC | PRN
Start: 2021-09-03 — End: 2021-09-03

## 2021-09-03 NOTE — Discharge Instructions (Addendum)
PLEASE CONTACT THE NUMBER ON THE BACK OF YOUR MEDICAID CARD TO ARRANGE TRANSPORTATION TO APPOINTMENTS. MUST BE DONE IN ADVANCE AND NOT THE SAME DAY AS APPOINTMENT. SHELTER RESOURCES ALSO ATTACHED  Discharge recommendations:  Patient is to take medications as prescribed.  Please see information for follow-up appointment with psychiatry and therapy.You are encouraged to follow up with [facility] for outpatient treatment.  Walk in/ Open Access Hours: Monday - Friday 8AM - 11AM (To see provider and therapist) Friday - [place] (To see therapist only)  [facility] [address] [place], [place] You are encouraged to follow up with [facility] for outpatient treatment.  Walk in/ Open Access Hours: Monday - Friday 8AM - 11AM (To see provider and therapist) Friday - [place] (To see therapist only)  [facility] [place], [place]   Therapy: We recommend that patient participate in individual therapy to address mental health concerns.  Medications: The parent/guardian is to contact a medical professional and/or outpatient provider to address any new side effects that develop. Parent/guardian should update outpatient providers of any new medications and/or medication changes.   Atypical antipsychotics: If you are prescribed an atypical antipsychotic, it is recommended that your height, weight, BMI, blood pressure, fasting lipid panel, and fasting blood sugar be monitored by your outpatient providers.  Safety:  The patient should abstain from use of illicit substances/drugs and abuse of any medications. If symptoms worsen or do not continue to improve or if the patient becomes actively suicidal or homicidal then it is recommended that the patient return to the closest hospital emergency department, the [facility], or call 911 for further  evaluation and treatment. National Suicide Prevention Lifeline 1-800-SUICIDE or 725-046-9244.  About 988 988 offers 24/7 access to trained crisis counselors who can help people experiencing mental health-related distress. People can call or text 988 or chat 988lifeline.org for themselves or if they are worried about a loved one who may need crisis support.

## 2021-09-03 NOTE — Progress Notes (Signed)
CSW attached shelter resources to patients AVS and provided a physical copy. Instructions are given on the AVS to contact the number on the back of medicaid card to arrange medical appointments in advance.

## 2021-09-03 NOTE — ED Notes (Signed)
Tele psych machine to bedside  

## 2021-09-03 NOTE — ED Notes (Signed)
Pt d/c home per MD order, Discharge summary reviewed with pt , personal property from locker, property from security, and medication from pharmacy all returned to pt. No s/s of acute distress noted at discharge.

## 2021-09-03 NOTE — ED Notes (Signed)
Pt encouraged to provide urine sample per MD order. Sitter aware of urine specimen need,

## 2021-09-04 LAB — PATHOLOGIST SMEAR REVIEW: Path Review: NEGATIVE

## 2021-09-08 LAB — POC SARS CORONAVIRUS 2 AG: SARSCOV2ONAVIRUS 2 AG: POSITIVE — AB

## 2021-09-09 ENCOUNTER — Other Ambulatory Visit: Payer: Self-pay

## 2021-09-14 ENCOUNTER — Ambulatory Visit: Payer: Medicaid Other | Admitting: Family

## 2021-11-22 ENCOUNTER — Emergency Department (HOSPITAL_COMMUNITY): Payer: Medicaid Other

## 2021-11-22 ENCOUNTER — Inpatient Hospital Stay (HOSPITAL_COMMUNITY)
Admission: EM | Admit: 2021-11-22 | Discharge: 2021-11-26 | DRG: 815 | Disposition: A | Discharge status: Home / Self Care | Payer: Medicaid Other | Attending: Internal Medicine | Admitting: Internal Medicine

## 2021-11-22 ENCOUNTER — Other Ambulatory Visit: Payer: Self-pay

## 2021-11-22 ENCOUNTER — Encounter (HOSPITAL_COMMUNITY): Payer: Self-pay

## 2021-11-22 DIAGNOSIS — Z818 Family history of other mental and behavioral disorders: Secondary | ICD-10-CM

## 2021-11-22 DIAGNOSIS — F17210 Nicotine dependence, cigarettes, uncomplicated: Secondary | ICD-10-CM | POA: Diagnosis present

## 2021-11-22 DIAGNOSIS — Z21 Asymptomatic human immunodeficiency virus [HIV] infection status: Secondary | ICD-10-CM | POA: Diagnosis not present

## 2021-11-22 DIAGNOSIS — Z79899 Other long term (current) drug therapy: Secondary | ICD-10-CM

## 2021-11-22 DIAGNOSIS — Z5900 Homelessness unspecified: Secondary | ICD-10-CM

## 2021-11-22 DIAGNOSIS — Z91013 Allergy to seafood: Secondary | ICD-10-CM

## 2021-11-22 DIAGNOSIS — Z7951 Long term (current) use of inhaled steroids: Secondary | ICD-10-CM

## 2021-11-22 DIAGNOSIS — F1910 Other psychoactive substance abuse, uncomplicated: Secondary | ICD-10-CM | POA: Diagnosis present

## 2021-11-22 DIAGNOSIS — B20 Human immunodeficiency virus [HIV] disease: Secondary | ICD-10-CM | POA: Diagnosis present

## 2021-11-22 DIAGNOSIS — Z91148 Patient's other noncompliance with medication regimen for other reason: Secondary | ICD-10-CM

## 2021-11-22 DIAGNOSIS — S36899A Unspecified injury of other intra-abdominal organs, initial encounter: Secondary | ICD-10-CM | POA: Diagnosis present

## 2021-11-22 DIAGNOSIS — F1990 Other psychoactive substance use, unspecified, uncomplicated: Secondary | ICD-10-CM | POA: Diagnosis present

## 2021-11-22 DIAGNOSIS — R197 Diarrhea, unspecified: Secondary | ICD-10-CM | POA: Diagnosis present

## 2021-11-22 DIAGNOSIS — R112 Nausea with vomiting, unspecified: Secondary | ICD-10-CM

## 2021-11-22 DIAGNOSIS — R109 Unspecified abdominal pain: Secondary | ICD-10-CM

## 2021-11-22 DIAGNOSIS — I10 Essential (primary) hypertension: Secondary | ICD-10-CM | POA: Diagnosis present

## 2021-11-22 DIAGNOSIS — Z6372 Alcoholism and drug addiction in family: Secondary | ICD-10-CM

## 2021-11-22 DIAGNOSIS — F3160 Bipolar disorder, current episode mixed, unspecified: Secondary | ICD-10-CM | POA: Diagnosis not present

## 2021-11-22 DIAGNOSIS — J4520 Mild intermittent asthma, uncomplicated: Secondary | ICD-10-CM | POA: Diagnosis present

## 2021-11-22 DIAGNOSIS — F1410 Cocaine abuse, uncomplicated: Principal | ICD-10-CM

## 2021-11-22 DIAGNOSIS — D649 Anemia, unspecified: Secondary | ICD-10-CM | POA: Insufficient documentation

## 2021-11-22 DIAGNOSIS — S36039A Unspecified laceration of spleen, initial encounter: Principal | ICD-10-CM | POA: Diagnosis present

## 2021-11-22 DIAGNOSIS — R1084 Generalized abdominal pain: Secondary | ICD-10-CM | POA: Diagnosis present

## 2021-11-22 DIAGNOSIS — R188 Other ascites: Secondary | ICD-10-CM | POA: Diagnosis present

## 2021-11-22 DIAGNOSIS — Y9355 Activity, bike riding: Secondary | ICD-10-CM

## 2021-11-22 DIAGNOSIS — E869 Volume depletion, unspecified: Secondary | ICD-10-CM | POA: Diagnosis present

## 2021-11-22 DIAGNOSIS — D638 Anemia in other chronic diseases classified elsewhere: Secondary | ICD-10-CM | POA: Diagnosis present

## 2021-11-22 DIAGNOSIS — M25512 Pain in left shoulder: Secondary | ICD-10-CM | POA: Diagnosis present

## 2021-11-22 DIAGNOSIS — R161 Splenomegaly, not elsewhere classified: Secondary | ICD-10-CM

## 2021-11-22 DIAGNOSIS — Z811 Family history of alcohol abuse and dependence: Secondary | ICD-10-CM

## 2021-11-22 LAB — CBC WITH DIFFERENTIAL/PLATELET
Abs Immature Granulocytes: 0.04 10*3/uL (ref 0.00–0.07)
Basophils Absolute: 0 10*3/uL (ref 0.0–0.1)
Basophils Relative: 0 %
Eosinophils Absolute: 0 10*3/uL (ref 0.0–0.5)
Eosinophils Relative: 0 %
HCT: 36 % — ABNORMAL LOW (ref 39.0–52.0)
Hemoglobin: 11.7 g/dL — ABNORMAL LOW (ref 13.0–17.0)
Immature Granulocytes: 1 %
Lymphocytes Relative: 21 %
Lymphs Abs: 1.7 10*3/uL (ref 0.7–4.0)
MCH: 31.2 pg (ref 26.0–34.0)
MCHC: 32.5 g/dL (ref 30.0–36.0)
MCV: 96 fL (ref 80.0–100.0)
Monocytes Absolute: 0.6 10*3/uL (ref 0.1–1.0)
Monocytes Relative: 8 %
Neutro Abs: 5.9 10*3/uL (ref 1.7–7.7)
Neutrophils Relative %: 70 %
Platelets: 324 10*3/uL (ref 150–400)
RBC: 3.75 MIL/uL — ABNORMAL LOW (ref 4.22–5.81)
RDW: 13.3 % (ref 11.5–15.5)
WBC: 8.3 10*3/uL (ref 4.0–10.5)
nRBC: 0 % (ref 0.0–0.2)

## 2021-11-22 LAB — COMPREHENSIVE METABOLIC PANEL
ALT: 12 U/L (ref 0–44)
AST: 19 U/L (ref 15–41)
Albumin: 3.5 g/dL (ref 3.5–5.0)
Alkaline Phosphatase: 68 U/L (ref 38–126)
Anion gap: 9 (ref 5–15)
BUN: 14 mg/dL (ref 6–20)
CO2: 24 mmol/L (ref 22–32)
Calcium: 8.7 mg/dL — ABNORMAL LOW (ref 8.9–10.3)
Chloride: 106 mmol/L (ref 98–111)
Creatinine, Ser: 0.92 mg/dL (ref 0.61–1.24)
GFR, Estimated: >60 mL/min (ref >60)
Glucose, Bld: 110 mg/dL — ABNORMAL HIGH (ref 70–99)
Potassium: 4 mmol/L (ref 3.5–5.1)
Sodium: 139 mmol/L (ref 135–145)
Total Bilirubin: 0.3 mg/dL (ref 0.3–1.2)
Total Protein: 7.2 g/dL (ref 6.5–8.1)

## 2021-11-22 LAB — TROPONIN I (HIGH SENSITIVITY)
Troponin I (High Sensitivity): 8 ng/L (ref <18)
Troponin I (High Sensitivity): 3 ng/L (ref <18)

## 2021-11-22 LAB — ETHANOL: Alcohol, Ethyl (B): <10 mg/dL (ref <10)

## 2021-11-22 LAB — CBC
HCT: 29.2 % — ABNORMAL LOW (ref 39.0–52.0)
Hemoglobin: 9.5 g/dL — ABNORMAL LOW (ref 13.0–17.0)
MCH: 31.1 pg (ref 26.0–34.0)
MCHC: 32.5 g/dL (ref 30.0–36.0)
MCV: 95.7 fL (ref 80.0–100.0)
Platelets: 272 10*3/uL (ref 150–400)
RBC: 3.05 MIL/uL — ABNORMAL LOW (ref 4.22–5.81)
RDW: 13.4 % (ref 11.5–15.5)
WBC: 8 10*3/uL (ref 4.0–10.5)
nRBC: 0 % (ref 0.0–0.2)

## 2021-11-22 LAB — LACTIC ACID, PLASMA
Lactic Acid, Venous: 1.8 mmol/L (ref 0.5–1.9)
Lactic Acid, Venous: 1.4 mmol/L (ref 0.5–1.9)

## 2021-11-22 LAB — POC OCCULT BLOOD, ED: Fecal Occult Bld: NEGATIVE

## 2021-11-22 LAB — RAPID URINE DRUG SCREEN, HOSP PERFORMED
Amphetamines: NOT DETECTED
Barbiturates: NOT DETECTED
Benzodiazepines: NOT DETECTED
Cocaine: POSITIVE — AB
Opiates: NOT DETECTED
Tetrahydrocannabinol: POSITIVE — AB

## 2021-11-22 LAB — URINALYSIS, ROUTINE W REFLEX MICROSCOPIC
Bilirubin Urine: NEGATIVE
Glucose, UA: NEGATIVE mg/dL
Hgb urine dipstick: NEGATIVE
Ketones, ur: NEGATIVE mg/dL
Leukocytes,Ua: NEGATIVE
Nitrite: NEGATIVE
Protein, ur: NEGATIVE mg/dL
Specific Gravity, Urine: 1.029 (ref 1.005–1.030)
pH: 5 (ref 5.0–8.0)

## 2021-11-22 LAB — LIPASE, BLOOD: Lipase: 25 U/L (ref 11–51)

## 2021-11-22 LAB — CK: Total CK: 147 U/L (ref 49–397)

## 2021-11-22 MED ORDER — DICYCLOMINE HCL 10 MG PO CAPS
10.0000 mg | ORAL_CAPSULE | Freq: Three times a day (TID) | ORAL | Status: DC | PRN
  Administered 2021-11-22 – 2021-11-24 (×2): 10 mg via ORAL
  Filled 2021-11-22 (×2): qty 1

## 2021-11-22 MED ORDER — ADULT MULTIVITAMIN W/MINERALS CH
1.0000 | ORAL_TABLET | Freq: Every day | ORAL | Status: DC
  Administered 2021-11-23 – 2021-11-24 (×2): 1 via ORAL
  Filled 2021-11-22 (×2): qty 1

## 2021-11-22 MED ORDER — ACETAMINOPHEN 325 MG PO TABS
650.0000 mg | ORAL_TABLET | Freq: Four times a day (QID) | ORAL | Status: DC | PRN
  Administered 2021-11-22 – 2021-11-24 (×4): 650 mg via ORAL
  Filled 2021-11-22 (×4): qty 2

## 2021-11-22 MED ORDER — SODIUM CHLORIDE 0.9 % IV BOLUS
1000.0000 mL | Freq: Once | INTRAVENOUS | Status: AC
  Administered 2021-11-22: 1000 mL via INTRAVENOUS

## 2021-11-22 MED ORDER — ONDANSETRON HCL 4 MG PO TABS
4.0000 mg | ORAL_TABLET | Freq: Four times a day (QID) | ORAL | Status: DC | PRN
  Administered 2021-11-23: 4 mg via ORAL
  Filled 2021-11-22: qty 1

## 2021-11-22 MED ORDER — LACTATED RINGERS IV SOLN
INTRAVENOUS | Status: AC
Start: 2021-11-22 — End: 2021-11-23

## 2021-11-22 MED ORDER — PANTOPRAZOLE SODIUM 40 MG IV SOLR
40.0000 mg | Freq: Once | INTRAVENOUS | Status: AC
  Administered 2021-11-22: 40 mg via INTRAVENOUS
  Filled 2021-11-22: qty 10

## 2021-11-22 MED ORDER — LORAZEPAM 2 MG/ML IJ SOLN
1.0000 mg | Freq: Once | INTRAMUSCULAR | Status: AC
  Administered 2021-11-22: 1 mg via INTRAVENOUS
  Filled 2021-11-22: qty 1

## 2021-11-22 MED ORDER — THIAMINE MONONITRATE 100 MG PO TABS
100.0000 mg | ORAL_TABLET | Freq: Every day | ORAL | Status: DC
  Administered 2021-11-23 – 2021-11-26 (×4): 100 mg via ORAL
  Filled 2021-11-22 (×4): qty 1

## 2021-11-22 MED ORDER — ALBUTEROL SULFATE (2.5 MG/3ML) 0.083% IN NEBU: 2.5000 mg | INHALATION_SOLUTION | RESPIRATORY_TRACT | Status: DC | PRN

## 2021-11-22 MED ORDER — LORAZEPAM 2 MG/ML IJ SOLN
1.0000 mg | INTRAMUSCULAR | Status: AC | PRN
  Administered 2021-11-25: 2 mg via INTRAVENOUS
  Filled 2021-11-22: qty 1

## 2021-11-22 MED ORDER — ONDANSETRON HCL 4 MG/2ML IJ SOLN
4.0000 mg | Freq: Four times a day (QID) | INTRAMUSCULAR | Status: DC | PRN
  Administered 2021-11-22: 4 mg via INTRAVENOUS
  Filled 2021-11-22: qty 2

## 2021-11-22 MED ORDER — ACETAMINOPHEN 650 MG RE SUPP: 650.0000 mg | Freq: Four times a day (QID) | RECTAL | Status: DC | PRN

## 2021-11-22 MED ORDER — LORAZEPAM 1 MG PO TABS: 1.0000 mg | ORAL_TABLET | Freq: Once | ORAL | Status: DC

## 2021-11-22 MED ORDER — FOLIC ACID 1 MG PO TABS
1.0000 mg | ORAL_TABLET | Freq: Every day | ORAL | Status: DC
  Administered 2021-11-23 – 2021-11-26 (×4): 1 mg via ORAL
  Filled 2021-11-22 (×4): qty 1

## 2021-11-22 MED ORDER — IOHEXOL 350 MG/ML SOLN
100.0000 mL | Freq: Once | INTRAVENOUS | Status: AC | PRN
  Administered 2021-11-22: 100 mL via INTRAVENOUS

## 2021-11-22 MED ORDER — THIAMINE HCL 100 MG/ML IJ SOLN: 100.0000 mg | Freq: Every day | INTRAMUSCULAR | Status: DC

## 2021-11-22 MED ORDER — LORAZEPAM 1 MG PO TABS
1.0000 mg | ORAL_TABLET | ORAL | Status: AC | PRN
  Administered 2021-11-22: 1 mg via ORAL
  Filled 2021-11-22: qty 2

## 2021-11-22 NOTE — H&P (Signed)
History and Physical    Patient: John [name] MRN: [id number] DOA: 11/22/2021  Date of Service: the patient was seen and examined on 11/22/2021  Patient coming from: Homeless  Chief Complaint:  Chief Complaint  Patient presents with   Abdominal Pain    HPI:   51 year old male with past medical history of asthma, HIV, polysubstance abuse including alcohol, marijuana and cocaine, nicotine dependence, bipolar disorder and medication noncompliance who presents to [facility] emergency department with complaints of nausea vomiting and abdominal pain.  Patient explains that he has been experiencing generalized abdominal pain.  Patient states that this abdominal pain began on 9/9.  Patient describes abdominal pain as generalized in location, radiating diffusely, cramping in quality and severe in intensity.  Over the following 24 hours patient began to experience worsening nausea and generalized weakness.  Symptoms persisted overnight and patient reports that starting earlier in the day on 9/90 began to experience diarrhea.  Patient states that these episodes of watery diarrhea and no associated blood or melena.  Patient denies fever, sick contacts, recent ingestion of undercooked food, recent travel or contacts with confirmed COVID-19 infection.  Patient's symptoms continue to worsen until he presented to [facility] emergency room for evaluation.  Upon evaluation in the emergency department patient was found to be clinically volume depleted and initiated on intravenous fluids.  Initial CBC revealed a hemoglobin of 11.7, down from 15.6 two months prior.  Rectal exam revealed no evidence of gross blood with occult blood testing being negative.  After hydration repeat CBC revealed a hemoglobin of 9.5.  EDP questing hospitalization due to abdominal pain with intractable nausea and vomiting as well as concerns for anemia.  Hospitalist group is now been called to assess patient for  admission to the hospital.  Review of Systems: Review of Systems  Constitutional:  Positive for malaise/fatigue.  Gastrointestinal:  Positive for abdominal pain, diarrhea, nausea and vomiting.  Neurological:  Positive for weakness.  All other systems reviewed and are negative.    Past Medical History:  Diagnosis Date   Acute hypoxemic respiratory failure ([place]) 05/07/2021   Adjustment disorder with depressed mood 10/28/2015   Alcoholism ([place])    Asthma    Bipolar disorder ([place])    with depression/anxiety   Cannabis use disorder, moderate, dependence ([place]) 04/02/2015   Chronic low back pain    Cocaine use disorder ([place]) 04/02/2015   Gout    HIV (human immunodeficiency virus infection) ([place])    "dx'd ~ 2 yr ago" (09/29/2012)   Homelessness    Hypertension    Polysubstance abuse ([place]) 04/11/2019    Past Surgical History:  Procedure Laterality Date   SKIN GRAFT FULL THICKNESS LEG Left ?   POSTERIOR LEFT LEG  AFTER DOG BITES    Social History:  reports that he has been smoking cigarettes. He has a 27.00 pack-year smoking history. He has never used smokeless tobacco. He reports current alcohol use of about 12.0 standard drinks of alcohol per week. He reports current drug use. Drugs: , Marijuana, and "Crack" cocaine.  Allergies  Allergen Reactions   Shellfish Allergy Anaphylaxis and Swelling    Family History  Problem Relation Age of Onset   Alcoholism Mother    Depression Mother    Alcoholism Brother     Prior to Admission medications   Medication Sig Start Date End Date Taking? Authorizing Provider  albuterol (VENTOLIN HFA) 108 (90 Base) MCG/ACT inhaler Inhale 2 puffs into the lungs every 4 (four)  hours as needed for wheezing or shortness of breath. Patient not taking: Reported on 09/02/2021 08/08/21   Mariel Aloe, MD  busPIRone (BUSPAR) 5 MG tablet Take 1 tablet (5 mg total) by mouth 2 (two) times daily. 09/03/21   White, Patrice L, NP  feeding supplement (ENSURE  ENLIVE / ENSURE PLUS) LIQD Take 237 mLs by mouth 2 (two) times daily between meals. Patient not taking: Reported on 09/02/2021 08/08/21   Mariel Aloe, MD  gabapentin (NEURONTIN) 100 MG capsule Take 1 capsule (100 mg total) by mouth 3 (three) times daily. Patient not taking: Reported on 09/02/2021 08/08/21 09/07/21  Mariel Aloe, MD  mirtazapine (REMERON) 15 MG tablet Take 1 tablet (15 mg total) by mouth at bedtime as needed (For sleep). 09/03/21   White, Patrice L, NP  mometasone-formoterol (DULERA) 100-5 MCG/ACT AERO Inhale 2 puffs into the lungs 2 (two) times daily. Patient not taking: Reported on 09/02/2021 08/08/21   Mariel Aloe, MD  QUEtiapine (SEROQUEL) 100 MG tablet Take 1 tablet (100 mg total) by mouth at bedtime as needed (For sleep). 09/03/21   White, Patrice L, NP  traZODone (DESYREL) 100 MG tablet Take 100 mg by mouth at bedtime as needed for sleep. Patient not taking: Reported on 09/02/2021    [provider]  dicyclomine (BENTYL) 20 MG tablet Take 1 tablet (20 mg total) by mouth 2 (two) times daily. 01/08/17 01/03/20  Palumbo, April, MD  sucralfate (CARAFATE) 1 GM/10ML suspension Take 10 mLs (1 g total) by mouth 4 (four) times daily -  with meals and at bedtime. Patient not taking: Reported on 10/21/2017 01/08/17 09/03/18  Veatrice Kells, MD    Physical Exam:  Vitals:   11/22/21 2115 11/22/21 2200 11/22/21 2215 11/22/21 2320  BP: 119/87 125/79 131/87 122/82  Pulse: 81 89 85 83  Resp: '19 19 17 20  '$ Temp:    99.2 F (37.3 C)  TempSrc:    Oral  SpO2: 97% 99% 99% 99%    Constitutional: Patient is lethargic but arousable and oriented x3, patient is in distress due to generalized abdominal pain.   Skin: no rashes, no lesions, poor skin turgor noted. Eyes: Pupils are equally reactive to light.  No evidence of scleral icterus or conjunctival pallor.  ENMT: Dry mucous membranes noted.  Posterior pharynx clear of any exudate or lesions.   Neck: normal, supple, no masses, no  thyromegaly.  No evidence of jugular venous distension.   Respiratory: Notable diffuse rhonchi that is bilateral, no wheezing, no crackles. Normal respiratory effort. No accessory muscle use.  Cardiovascular: Regular rate and rhythm, no murmurs / rubs / gallops. No extremity edema. 2+ pedal pulses. No carotid bruits.  Chest:   Nontender without crepitus or deformity.   Back:   Nontender without crepitus or deformity. Abdomen: Less abdominal tenderness.  Abdomen is soft.  No evidence of intra-abdominal masses.  Positive bowel sounds noted in all quadrants.   Musculoskeletal: No joint deformity upper and lower extremities. Good ROM, no contractures. Normal muscle tone.  Neurologic: CN 2-12 grossly intact. Sensation intact.  Patient moving all 4 extremities spontaneously.  Patient is following all commands.  Patient is responsive to verbal stimuli.   Psychiatric: Unable to fully assess due to significant lethargy.  When patient is aroused exhibits an extremely angry mood.  Unclear as to whether patient possesses insight as to his current situation.    Data Reviewed:  I have personally reviewed and interpreted labs, imaging.  Significant findings are  Lab Results  Component Value Date   WBC 8.0 11/22/2021   HGB 9.5 (L) 11/22/2021   HCT 29.2 (L) 11/22/2021   MCV 95.7 11/22/2021   PLT 272 11/22/2021   Lab Results  Component Value Date   K 4.0 11/22/2021   Lab Results  Component Value Date   BUN 14 11/22/2021   Lab Results  Component Value Date   CREATININE 0.92 11/22/2021    CXR:   Chest X-ray was personally reviewed.  No evidence of focal infiltrates.  No evidence of pleural effusion.  No evidence of pneumothorax.    EKG: Personally reviewed.  Rhythm is normal sinus rhythm with heart rate of 79 bpm.  No dynamic ST segment changes appreciated.   Assessment and Plan: * Acute diarrhea Patient presenting with severe generalized abdominal pain cramping and watery  diarrhea Presentation complicated by presence of longstanding HIV without ongoing HAART therapy so atypical infectious processes must be considered CT imaging of the abdomen pelvis performed in the emergency department does not seem to identify a definitive cause of the patient's discomfort Urinalysis, lipase and hepatic function panel unremarkable Abdominal pain may be primarily due to abdominal cramping from acute diarrhea and may be self-limiting Hydrating patient with intravenous isotonic fluids As needed Bentyl for symptoms Clear liquid diet Obtaining stool studies including GI pathogen panel and C. difficile testing   Generalized abdominal pain Please see assessment and plan above  Normocytic anemia Patient presenting with hemoglobin of 11.7, down from what appears to be a baseline in May of between 14 and 15.  Later in the emergency department course hemoglobin has dropped to 9.5. Patient reports no bright red blood in the stool or melena patient is not taking any blood thinners but does drink alcohol regularly Rectal exam in the emergency department reveals no evidence of gross blood in stool Hemoccult is negative I believe that a good portion of the patient's anemia is due to anemia of chronic disease in the setting of longstanding poorly managed HIV With drop from 11.7-9.5 here in the emergency room may be simply dilutional We will monitor for any clinical evidence of bleeding, obtain serial CBCs If hemoglobin continues to precipitously drop will obtain GI consultation Pending iron panel, B12, folate   Mild intermittent asthma without complication No evidence of acute asthma exacerbation As needed bronchodilator therapy for shortness of breath and wheezing.   HIV (human immunodeficiency virus infection) ([place]) Longstanding history of HIV with medication noncompliance Patient states that he does not take HAART therapy due to not "liking the way it makes me feel." Patient  declines being resumed on antiretroviral therapy therefore we will abstain from infectious disease consultation  Polysubstance abuse [facility]) Patient is lethargic throughout the interview and is extremely angry whenever he is aroused We will attempt to revisit this topic as patient is more awake and alert and counseled patient on cessation Unclear as to how much alcohol patient is currently drinking as he is unwilling to share this.  We will place patient on CIWA protocol in the meantime.  Bipolar disorder, mixed ([place]) Longstanding history of bipolar disorder Patient is not currently taking any medications We will place psychiatry consultation for recommendations on psychotropic regimen going forward       Code Status:  Full code  code status decision has been confirmed with: patient Family Communication: deferred   Consults: Psychiatry  Severity of Illness:  The appropriate patient status for this patient is OBSERVATION. Observation status is judged to be  reasonable and necessary in order to provide the required intensity of service to ensure the patient's safety. The patient's presenting symptoms, physical exam findings, and initial radiographic and laboratory data in the context of their medical condition is felt to place them at decreased risk for further clinical deterioration. Furthermore, it is anticipated that the patient will be medically stable for discharge from the hospital within 2 midnights of admission.   Author:  Vernelle Emerald MD  11/22/2021 11:54 PM

## 2021-11-22 NOTE — ED Notes (Signed)
ED TO INPATIENT HANDOFF REPORT  S Name/Age/Gender John [name] 51 y.o. male Room/Bed: 027C/027C  Code Status : FULL   Chief Complaint Intractable nausea and vomiting [R11.2]  Triage Note Patient arrived by [facility] with complaint of generalized abdominal pain x 1 hour with no associated symptoms. Homeless and no HIV meds x 1 year per ems. Patient drinks etoh several times a week   Allergies Allergies  Allergen Reactions   Shellfish Allergy Anaphylaxis and Swelling    Level of Care/Admitting Diagnosis ED Disposition     ED Disposition  Admit   Condition  --   [place]: [place] [[id number]]  Level of Care: Telemetry Medical [104]  May place patient in observation at [facility] or [place] if equivalent level of care is available:: No  Covid Evaluation: Asymptomatic - no recent exposure (last 10 days) testing not required  Diagnosis: Intractable nausea and vomiting [528413]  Admitting Physician: Vernelle Emerald [[id number]]  Attending Physician: Vernelle Emerald [[id number]]          B Medical/Surgery History Past Medical History:  Diagnosis Date   Acute hypoxemic respiratory failure ([place]) 05/07/2021   Adjustment disorder with depressed mood 10/28/2015   Alcoholism ([place])    Asthma    Bipolar disorder ([place])    with depression/anxiety   Cannabis use disorder, moderate, dependence ([place]) 04/02/2015   Chronic low back pain    Cocaine use disorder ([place]) 04/02/2015   Gout    HIV (human immunodeficiency virus infection) ([place])    "dx'd ~ 2 yr ago" (09/29/2012)   Homelessness    Hypertension    Polysubstance abuse ([place]) 04/11/2019   Past Surgical History:  Procedure Laterality Date   SKIN GRAFT FULL THICKNESS LEG Left ?   POSTERIOR LEFT LEG  AFTER DOG BITES     A IV Location/Drains/Wounds Patient Lines/Drains/Airways Status     Active Line/Drains/Airways     Name Placement date Placement time Site Days   Peripheral IV  11/22/21 20 G Left Antecubital 11/22/21  --  Antecubital  less than 1            Intake/Output Last 24 hours  Intake/Output Summary (Last 24 hours) at 11/22/2021 2121 Last data filed at 11/22/2021 1902 Gross per 24 hour  Intake 1000 ml  Output --  Net 1000 ml    Labs/Imaging Results for orders placed or performed during the hospital encounter of 11/22/21 (from the past 48 hour(s))  Lactic acid, plasma     Status: None   Collection Time: 11/22/21  4:46 PM  Result Value Ref Range   Lactic Acid, Venous 1.8 0.5 - 1.9 mmol/L    Comment: Performed at [facility], [address], [place], [place] [address]  Urinalysis, Routine w reflex microscopic     Status: None   Collection Time: 11/22/21  4:48 PM  Result Value Ref Range   Color, Urine YELLOW YELLOW   APPearance CLEAR CLEAR   Specific Gravity, Urine 1.029 1.005 - 1.030   pH 5.0 5.0 - 8.0   Glucose, UA NEGATIVE NEGATIVE mg/dL   Hgb urine dipstick NEGATIVE NEGATIVE   Bilirubin Urine NEGATIVE NEGATIVE   Ketones, ur NEGATIVE NEGATIVE mg/dL   Protein, ur NEGATIVE NEGATIVE mg/dL   Nitrite NEGATIVE NEGATIVE   Leukocytes,Ua NEGATIVE NEGATIVE    Comment: Performed at [facility] [address], [place], [place] [address]  Rapid urine drug screen (hospital performed)     Status:  Abnormal   Collection Time: 11/22/21  4:48 PM  Result Value Ref Range   Opiates NONE DETECTED NONE DETECTED   Cocaine POSITIVE (A) NONE DETECTED   Benzodiazepines NONE DETECTED NONE DETECTED   Amphetamines NONE DETECTED NONE DETECTED   Tetrahydrocannabinol POSITIVE (A) NONE DETECTED   Barbiturates NONE DETECTED NONE DETECTED    Comment: (NOTE) DRUG SCREEN FOR MEDICAL PURPOSES ONLY.  IF CONFIRMATION IS NEEDED FOR ANY PURPOSE, NOTIFY LAB WITHIN 5 DAYS.  LOWEST DETECTABLE LIMITS FOR URINE DRUG SCREEN Drug Class                     Cutoff (ng/mL) Amphetamine and metabolites    1000 Barbiturate and metabolites    200 Benzodiazepine                  878 Tricyclics and metabolites     300 Opiates and metabolites        300 Cocaine and metabolites        300 THC                            50 Performed at [facility], [place] [address] [place], [place] [address]   Comprehensive metabolic panel     Status: Abnormal   Collection Time: 11/22/21  5:00 PM  Result Value Ref Range   Sodium 139 135 - 145 mmol/L   Potassium 4.0 3.5 - 5.1 mmol/L   Chloride 106 98 - 111 mmol/L   CO2 24 22 - 32 mmol/L   Glucose, Bld 110 (H) 70 - 99 mg/dL    Comment: Glucose reference range applies only to samples taken after fasting for at least 8 hours.   BUN 14 6 - 20 mg/dL   Creatinine, Ser 0.92 0.61 - 1.24 mg/dL   Calcium 8.7 (L) 8.9 - 10.3 mg/dL   Total Protein 7.2 6.5 - 8.1 g/dL   Albumin 3.5 3.5 - 5.0 g/dL   AST 19 15 - 41 U/L   ALT 12 0 - 44 U/L   Alkaline Phosphatase 68 38 - 126 U/L   Total Bilirubin 0.3 0.3 - 1.2 mg/dL   GFR, Estimated >60 >60 mL/min    Comment: (NOTE) Calculated using the CKD-EPI Creatinine Equation (2021)    Anion gap 9 5 - 15    Comment: Performed at [facility] [address] [place], [place] [address]  Lipase, blood     Status: None   Collection Time: 11/22/21  5:00 PM  Result Value Ref Range   Lipase 25 11 - 51 U/L    Comment: Performed at [facility] [address] [place],  [address]  CBC with Diff     Status: Abnormal   Collection Time: 11/22/21  5:00 PM  Result Value Ref Range   WBC 8.3 4.0 - 10.5 K/uL   RBC 3.75 (L) 4.22 - 5.81 MIL/uL   Hemoglobin 11.7 (L) 13.0 - 17.0 g/dL   HCT 36.0 (L) 39.0 - 52.0 %   MCV 96.0 80.0 - 100.0 fL   MCH 31.2 26.0 - 34.0 pg   MCHC 32.5 30.0 - 36.0 g/dL   RDW 13.3 11.5 - 15.5 %   Platelets 324 150 - 400 K/uL    Comment: REPEATED TO VERIFY   nRBC 0.0 0.0 - 0.2 %   Neutrophils Relative % 70 %   Neutro Abs 5.9 1.7 - 7.7 K/uL  Lymphocytes Relative 21 %   Lymphs Abs 1.7 0.7 - 4.0 K/uL   Monocytes Relative 8 %   Monocytes Absolute 0.6 0.1  - 1.0 K/uL   Eosinophils Relative 0 %   Eosinophils Absolute 0.0 0.0 - 0.5 K/uL   Basophils Relative 0 %   Basophils Absolute 0.0 0.0 - 0.1 K/uL   Immature Granulocytes 1 %   Abs Immature Granulocytes 0.04 0.00 - 0.07 K/uL    Comment: Performed at [facility], [place] [address] [place], [place] [address]  Ethanol     Status: None   Collection Time: 11/22/21  5:00 PM  Result Value Ref Range   Alcohol, Ethyl (B) <10 <10 mg/dL    Comment: (NOTE) Lowest detectable limit for serum alcohol is 10 mg/dL.  For medical purposes only. Performed at [facility], [place] [address], [place], [place] [address]   Troponin I (High Sensitivity)     Status: None   Collection Time: 11/22/21  5:00 PM  Result Value Ref Range   Troponin I (High Sensitivity) 8 <18 ng/L    Comment: (NOTE) Elevated high sensitivity troponin I (hsTnI) values and significant  changes across serial measurements may suggest ACS but many other  chronic and acute conditions are known to elevate hsTnI results.  Refer to the "Links" section for chest pain algorithms and additional  guidance. Performed at [facility], [place] [address] [place], [place] [address]   CK     Status: None   Collection Time: 11/22/21  5:00 PM  Result Value Ref Range   Total CK 147 49 - 397 U/L    Comment: Performed at [facility], [place] [address] [place], [place] [address]  Troponin I (High Sensitivity)     Status: None   Collection Time: 11/22/21  6:14 PM  Result Value Ref Range   Troponin I (High Sensitivity) 3 <18 ng/L    Comment: (NOTE) Elevated high sensitivity troponin I (hsTnI) values and significant  changes across serial measurements may suggest ACS but many other  chronic and acute conditions are known to elevate hsTnI results.  Refer to the "Links" section for chest pain algorithms and additional  guidance. Performed at [facility], [place] [address] [place], [place] [address]   Lactic acid, plasma      Status: None   Collection Time: 11/22/21  7:25 PM  Result Value Ref Range   Lactic Acid, Venous 1.4 0.5 - 1.9 mmol/L    Comment: Performed at [facility] [address], [place],  [address]  CBC     Status: Abnormal   Collection Time: 11/22/21  7:25 PM  Result Value Ref Range   WBC 8.0 4.0 - 10.5 K/uL   RBC 3.05 (L) 4.22 - 5.81 MIL/uL   Hemoglobin 9.5 (L) 13.0 - 17.0 g/dL   HCT 29.2 (L) 39.0 - 52.0 %   MCV 95.7 80.0 - 100.0 fL   MCH 31.1 26.0 - 34.0 pg   MCHC 32.5 30.0 - 36.0 g/dL   RDW 13.4 11.5 - 15.5 %   Platelets 272 150 - 400 K/uL   nRBC 0.0 0.0 - 0.2 %    Comment: Performed at [facility], [place] [address], [place],  [address]  POC occult blood, ED Provider will collect     Status: None   Collection Time: 11/22/21  8:41 PM  Result Value Ref Range   Fecal Occult Bld NEGATIVE  NEGATIVE   CT Angio Chest/Abd/Pel for Dissection W and/or Wo Contrast  Result Date: 11/22/2021 CLINICAL DATA:  Abdominal pain, ETOH, HIV. EXAM: CT ANGIOGRAPHY CHEST, ABDOMEN AND PELVIS TECHNIQUE: Non-contrast CT of the chest was initially obtained. Multidetector CT imaging through the chest, abdomen and pelvis was performed using the standard protocol during bolus administration of intravenous contrast. Multiplanar reconstructed images and MIPs were obtained and reviewed to evaluate the vascular anatomy. RADIATION DOSE REDUCTION: This exam was performed according to the departmental dose-optimization program which includes automated exposure control, adjustment of the mA and/or kV according to patient size and/or use of iterative reconstruction technique. CONTRAST:  166m OMNIPAQUE IOHEXOL 350 MG/ML SOLN COMPARISON:  CT chest dated 08/07/2021. CT abdomen/pelvis dated 12/19/2019. FINDINGS: CTA CHEST FINDINGS Cardiovascular: Unenhanced CT, there is no evidence of maternal hematoma. Following contrast administration, there is no evidence of thoracic aortic aneurysm or dissection. Although  not tailored for evaluation of the pulmonary arteries, there is no evidence of pulmonary embolism to the lobar level. The heart is normal in size.  No pericardial effusion. Mediastinum/Nodes: No suspicious mediastinal lymphadenopathy. Visualized thyroid is unremarkable. Lungs/Pleura: Evaluated lung parenchyma is markedly constrained by respiratory motion. Within that constraint, there are no suspicious pulmonary nodules. No focal consolidation. Mild paraseptal emphysematous changes of the lung apices. Mild biapical pleural-parenchymal scarring. No pleural effusion or pneumothorax. Musculoskeletal: Mild degenerative changes of the mid/lower thoracic spine. Review of the MIP images confirms the above findings. CTA ABDOMEN AND PELVIS FINDINGS VASCULAR Aorta: No evidence of abdominal aortic aneurysm or dissection. Patent. Celiac: Patent. SMA: Patent. Renals: Patent bilaterally. IMA: Patent. Inflow: Patent bilaterally. Veins: Unremarkable. Review of the MIP images confirms the above findings. NON-VASCULAR Motion degraded images. Hepatobiliary: Liver is poorly evaluated but grossly unremarkable. Suspected focal fat/altered perfusion adjacent to the falciform ligament (series 7/image 171). Gallbladder is poorly evaluated but grossly unremarkable. Pancreas: Within normal limits. Spleen: Heterogeneous perfusion of the spleen, likely related to phase of enhancement. Adrenals/Urinary Tract: Adrenal glands are within normal limits. Kidneys are within normal limits.  No hydronephrosis. Bladder is underdistended and grossly unremarkable. Stomach/Bowel: Stomach is grossly unremarkable. No evidence of bowel obstruction. Visualized bowel otherwise poorly evaluated. Lymphatic: No suspicious abdominopelvic lymphadenopathy, noting motion degradation. Reproductive: Prostate is unremarkable. Other: Moderate abdominopelvic ascites. Tiny fat containing bilateral inguinal hernias (series 7/image 289). Musculoskeletal: Mild degenerative  changes of the lumbar spine. Review of the MIP images confirms the above findings. IMPRESSION: No evidence of thoracoabdominal aortic aneurysm or dissection. No evidence of central pulmonary embolism. Moderate abdominopelvic ascites. Electronically Signed   By: SJulian HyM.D.   On: 11/22/2021 20:19   DG Chest Portable 1 View  Result Date: 11/22/2021 CLINICAL DATA:  Acute chest pain. EXAM: PORTABLE CHEST 1 VIEW COMPARISON:  09/02/2021 and prior studies FINDINGS: The cardiomediastinal silhouette is unchanged. There is no evidence of focal airspace disease, pulmonary edema, suspicious pulmonary nodule/mass, pleural effusion, or pneumothorax. No acute bony abnormalities are identified. IMPRESSION: No active disease. Electronically Signed   By: JMargarette CanadaM.D.   On: 11/22/2021 16:28    Pending Labs Unresulted Labs (From admission, onward)     Start     Ordered   11/22/21 0930  [facility]CD4[facility]  Once,   URGENT       Question:  Release to patient  Answer:  Immediate   11/22/21 0929            Vitals/Pain Today's Vitals   11/22/21 1945 11/22/21 2002 11/22/21  2010 11/22/21 2025  BP: 129/86   135/78  Pulse: 93   74  Resp: 17   18  Temp:  98.3 F (36.8 C)    TempSrc:  Oral    SpO2: 98%   99%  PainSc:   Asleep     Isolation Precautions No active isolations  Medications Medications  sodium chloride 0.9 % bolus 1,000 mL (0 mLs Intravenous Stopped 11/22/21 1902)  LORazepam (ATIVAN) injection 1 mg (1 mg Intravenous Given 11/22/21 1653)  pantoprazole (PROTONIX) injection 40 mg (40 mg Intravenous Given 11/22/21 1659)  iohexol (OMNIPAQUE) 350 MG/ML injection 100 mL (100 mLs Intravenous Contrast Given 11/22/21 2001)    Mobility: AMBULATORY        R Recommendations: See Admitting Provider Note

## 2021-11-22 NOTE — ED Notes (Signed)
Patient transported to CT scan . 

## 2021-11-22 NOTE — ED Provider Notes (Signed)
[facility] EMERGENCY DEPARTMENT Provider Note   CSN: [id number] Arrival date & time: 11/22/21  7867     History  Chief Complaint  Patient presents with   Abdominal Pain    John [name] is a 51 y.o. male.  Patient here with abdominal pain, nausea, admits alcohol and cocaine use.  Having cramps throughout his body.  History of HIV but noncompliance.  History of polysubstance abuse.  History of bipolar, hypertension.  Denies any fevers or chills.  Nothing makes it worse or better.  Denies any black or bloody stools.  The history is provided by the patient.       Home Medications Prior to Admission medications   Medication Sig Start Date End Date Taking? Authorizing Provider  albuterol (VENTOLIN HFA) 108 (90 Base) MCG/ACT inhaler Inhale 2 puffs into the lungs every 4 (four) hours as needed for wheezing or shortness of breath. Patient not taking: Reported on 09/02/2021 08/08/21   Mariel Aloe, MD  busPIRone (BUSPAR) 5 MG tablet Take 1 tablet (5 mg total) by mouth 2 (two) times daily. 09/03/21   White, Patrice L, NP  feeding supplement (ENSURE ENLIVE / ENSURE PLUS) LIQD Take 237 mLs by mouth 2 (two) times daily between meals. Patient not taking: Reported on 09/02/2021 08/08/21   Mariel Aloe, MD  gabapentin (NEURONTIN) 100 MG capsule Take 1 capsule (100 mg total) by mouth 3 (three) times daily. Patient not taking: Reported on 09/02/2021 08/08/21 09/07/21  Mariel Aloe, MD  mirtazapine (REMERON) 15 MG tablet Take 1 tablet (15 mg total) by mouth at bedtime as needed (For sleep). 09/03/21   White, Patrice L, NP  mometasone-formoterol (DULERA) 100-5 MCG/ACT AERO Inhale 2 puffs into the lungs 2 (two) times daily. Patient not taking: Reported on 09/02/2021 08/08/21   Mariel Aloe, MD  QUEtiapine (SEROQUEL) 100 MG tablet Take 1 tablet (100 mg total) by mouth at bedtime as needed (For sleep). 09/03/21   White, Patrice L, NP  traZODone (DESYREL) 100 MG tablet Take 100 mg  by mouth at bedtime as needed for sleep. Patient not taking: Reported on 09/02/2021    [provider]  dicyclomine (BENTYL) 20 MG tablet Take 1 tablet (20 mg total) by mouth 2 (two) times daily. 01/08/17 01/03/20  Palumbo, April, MD  sucralfate (CARAFATE) 1 GM/10ML suspension Take 10 mLs (1 g total) by mouth 4 (four) times daily -  with meals and at bedtime. Patient not taking: Reported on 10/21/2017 01/08/17 09/03/18  Palumbo, April, MD      Allergies    Shellfish allergy    Review of Systems   Review of Systems  Physical Exam Updated Vital Signs BP 135/78   Pulse 74   Temp 98.3 F (36.8 C) (Oral)   Resp 18   SpO2 99%  Physical Exam Vitals and nursing note reviewed.  Constitutional:      General: He is in acute distress.     Appearance: He is well-developed. He is ill-appearing.  HENT:     Head: Normocephalic and atraumatic.     Nose: Nose normal.  Eyes:     Extraocular Movements: Extraocular movements intact.     Conjunctiva/sclera: Conjunctivae normal.     Pupils: Pupils are equal, round, and reactive to light.  Cardiovascular:     Rate and Rhythm: Normal rate and regular rhythm.     Pulses: Normal pulses.     Heart sounds: Normal heart sounds. No murmur heard. Pulmonary:  Effort: Pulmonary effort is normal. No respiratory distress.     Breath sounds: Normal breath sounds.  Abdominal:     Palpations: Abdomen is soft.     Tenderness: There is abdominal tenderness.  Musculoskeletal:        General: No swelling.     Cervical back: Neck supple.  Skin:    General: Skin is warm and dry.     Capillary Refill: Capillary refill takes less than 2 seconds.  Neurological:     General: No focal deficit present.     Mental Status: He is alert and oriented to person, place, and time.     Cranial Nerves: No cranial nerve deficit.     Sensory: No sensory deficit.     Motor: No weakness.     Coordination: Coordination normal.  Psychiatric:        Mood and Affect:  Mood normal.     ED Results / Procedures / Treatments   Labs (all labs ordered are listed, but only abnormal results are displayed) Labs Reviewed  COMPREHENSIVE METABOLIC PANEL - Abnormal; Notable for the following components:      Result Value   Glucose, Bld 110 (*)    Calcium 8.7 (*)    All other components within normal limits  CBC WITH DIFFERENTIAL/PLATELET - Abnormal; Notable for the following components:   RBC 3.75 (*)    Hemoglobin 11.7 (*)    HCT 36.0 (*)    All other components within normal limits  RAPID URINE DRUG SCREEN, HOSP PERFORMED - Abnormal; Notable for the following components:   Cocaine POSITIVE (*)    Tetrahydrocannabinol POSITIVE (*)    All other components within normal limits  CBC - Abnormal; Notable for the following components:   RBC 3.05 (*)    Hemoglobin 9.5 (*)    HCT 29.2 (*)    All other components within normal limits  LIPASE, BLOOD  URINALYSIS, ROUTINE W REFLEX MICROSCOPIC  ETHANOL  CK  LACTIC ACID, PLASMA  LACTIC ACID, PLASMA  T-HELPER CELLS (CD4) COUNT (NOT AT [facility])  POC OCCULT BLOOD, ED  TROPONIN I (HIGH SENSITIVITY)  TROPONIN I (HIGH SENSITIVITY)    EKG EKG Interpretation  Date/Time:  Sunday November 22 2021 17:08:38 EDT Ventricular Rate:  79 PR Interval:  128 QRS Duration: 83 QT Interval:  378 QTC Calculation: 434 R Axis:   24 Text Interpretation: Sinus rhythm Confirmed by Lennice Sites ([id number]) on 11/22/2021 5:39:57 PM  Radiology CT Angio Chest/Abd/Pel for Dissection W and/or Wo Contrast  Result Date: 11/22/2021 CLINICAL DATA:  Abdominal pain, ETOH, HIV. EXAM: CT ANGIOGRAPHY CHEST, ABDOMEN AND PELVIS TECHNIQUE: Non-contrast CT of the chest was initially obtained. Multidetector CT imaging through the chest, abdomen and pelvis was performed using the standard protocol during bolus administration of intravenous contrast. Multiplanar reconstructed images and MIPs were obtained and reviewed to evaluate the vascular anatomy.  RADIATION DOSE REDUCTION: This exam was performed according to the departmental dose-optimization program which includes automated exposure control, adjustment of the mA and/or kV according to patient size and/or use of iterative reconstruction technique. CONTRAST:  11m OMNIPAQUE IOHEXOL 350 MG/ML SOLN COMPARISON:  CT chest dated 08/07/2021. CT abdomen/pelvis dated 12/19/2019. FINDINGS: CTA CHEST FINDINGS Cardiovascular: Unenhanced CT, there is no evidence of maternal hematoma. Following contrast administration, there is no evidence of thoracic aortic aneurysm or dissection. Although not tailored for evaluation of the pulmonary arteries, there is no evidence of pulmonary embolism to the lobar level. The heart is normal in size.  No pericardial effusion. Mediastinum/Nodes: No suspicious mediastinal lymphadenopathy. Visualized thyroid is unremarkable. Lungs/Pleura: Evaluated lung parenchyma is markedly constrained by respiratory motion. Within that constraint, there are no suspicious pulmonary nodules. No focal consolidation. Mild paraseptal emphysematous changes of the lung apices. Mild biapical pleural-parenchymal scarring. No pleural effusion or pneumothorax. Musculoskeletal: Mild degenerative changes of the mid/lower thoracic spine. Review of the MIP images confirms the above findings. CTA ABDOMEN AND PELVIS FINDINGS VASCULAR Aorta: No evidence of abdominal aortic aneurysm or dissection. Patent. Celiac: Patent. SMA: Patent. Renals: Patent bilaterally. IMA: Patent. Inflow: Patent bilaterally. Veins: Unremarkable. Review of the MIP images confirms the above findings. NON-VASCULAR Motion degraded images. Hepatobiliary: Liver is poorly evaluated but grossly unremarkable. Suspected focal fat/altered perfusion adjacent to the falciform ligament (series 7/image 171). Gallbladder is poorly evaluated but grossly unremarkable. Pancreas: Within normal limits. Spleen: Heterogeneous perfusion of the spleen, likely related to  phase of enhancement. Adrenals/Urinary Tract: Adrenal glands are within normal limits. Kidneys are within normal limits.  No hydronephrosis. Bladder is underdistended and grossly unremarkable. Stomach/Bowel: Stomach is grossly unremarkable. No evidence of bowel obstruction. Visualized bowel otherwise poorly evaluated. Lymphatic: No suspicious abdominopelvic lymphadenopathy, noting motion degradation. Reproductive: Prostate is unremarkable. Other: Moderate abdominopelvic ascites. Tiny fat containing bilateral inguinal hernias (series 7/image 289). Musculoskeletal: Mild degenerative changes of the lumbar spine. Review of the MIP images confirms the above findings. IMPRESSION: No evidence of thoracoabdominal aortic aneurysm or dissection. No evidence of central pulmonary embolism. Moderate abdominopelvic ascites. Electronically Signed   By: Julian Hy M.D.   On: 11/22/2021 20:19   DG Chest Portable 1 View  Result Date: 11/22/2021 CLINICAL DATA:  Acute chest pain. EXAM: PORTABLE CHEST 1 VIEW COMPARISON:  09/02/2021 and prior studies FINDINGS: The cardiomediastinal silhouette is unchanged. There is no evidence of focal airspace disease, pulmonary edema, suspicious pulmonary nodule/mass, pleural effusion, or pneumothorax. No acute bony abnormalities are identified. IMPRESSION: No active disease. Electronically Signed   By: Margarette Canada M.D.   On: 11/22/2021 16:28    Procedures Procedures    Medications Ordered in ED Medications  sodium chloride 0.9 % bolus 1,000 mL (0 mLs Intravenous Stopped 11/22/21 1902)  LORazepam (ATIVAN) injection 1 mg (1 mg Intravenous Given 11/22/21 1653)  pantoprazole (PROTONIX) injection 40 mg (40 mg Intravenous Given 11/22/21 1659)  iohexol (OMNIPAQUE) 350 MG/ML injection 100 mL (100 mLs Intravenous Contrast Given 11/22/21 2001)    ED Course/ Medical Decision Making/ A&P                           Medical Decision Making Amount and/or Complexity of Data Reviewed Labs:  ordered. Radiology: ordered.  Risk Prescription drug management. Decision regarding hospitalization.   John [name] is here with abdominal pain.  Normal vitals.  No fever.  History of HIV with noncompliance, homelessness, polysubstance abuse including cocaine, marijuana, alcohol.  Admits alcohol and cocaine use today.  Pain all over his body but mostly in his abdomen.  He is having cramps.  Differential is wide including ACS versus pancreatitis versus gastritis versus infectious process versus electrolyte abnormality versus rhabdomyolysis.  We will check CBC, CMP, lipase, CK, alcohol level, UDS, chest x-ray and will likely consider CT imaging of the abdomen and pelvis.  Will give IV fluid bolus, IV Ativan IV antiemetics.  Per my review of radiology report dissection study is unremarkable.  Per my review and interpretation of labs his hemoglobin is 11.7 which is significantly down from a few months ago  when it was in the 15 range.  Repeat hemoglobin continues to downtrend at 9.5 despite a negative Hemoccult.  Lactic acid is normal.  No significant electrolyte abnormality or other kidney injury or elevated creatinine or CK otherwise.  Alcohol level is negative.  Troponin normal.  Urinalysis negative for infection.  Drug screen positive for cocaine.  Overall he is got a downtrending hemoglobin that I am not sure what that is from.  Could be hemoconcentration from prior hemoglobins.  This could be more of his baseline.  But he is unable to tolerate p.o. he is got uncontrollable nausea and discomfort.  Talked with medicine about observation stay and we will keep overnight for hydration and further trending of his hemoglobin and further treatment of his nausea and discomfort.  Dr. Marlyce Huge to admit.  This chart was dictated using voice recognition software.  Despite best efforts to proofread,  errors can occur which can change the documentation meaning.         Final Clinical Impression(s) / ED  Diagnoses Final diagnoses:  Cocaine abuse ([place])  Nausea and vomiting, unspecified vomiting type  Abdominal pain, unspecified abdominal location    Rx / DC Orders ED Discharge Orders     None         Lennice Sites, DO 11/22/21 2121

## 2021-11-22 NOTE — Assessment & Plan Note (Signed)
?   No evidence of acute asthma exacerbation ?? As needed bronchodilator therapy for shortness of breath and wheezing. ? ?

## 2021-11-22 NOTE — Assessment & Plan Note (Signed)
   Longstanding history of HIV with medication noncompliance  Patient states that he does not take HAART therapy due to not "liking the way it makes me feel."  Patient declines being resumed on antiretroviral therapy therefore we will abstain from infectious disease consultation

## 2021-11-22 NOTE — ED Provider Triage Note (Signed)
Emergency Medicine Provider Triage Evaluation Note  John [name] , a 51 y.o. male  was evaluated in triage.  Pt complains of abdominal pain. Same began 1 hour ago, is located diffusely throughout his abdomen. Denies any n/v/d. Hx HIV, states he has not been taking his meds. Also endorses hx alcohol abuse, last drink was 0530 this am.  Review of Systems  Positive:  Negative:   Physical Exam  BP 101/85 (BP Location: Right Arm)   Pulse 87   Temp 98.5 F (36.9 C)   Resp 17   SpO2 100%  Gen:   Awake, no distress   Resp:  Normal effort  MSK:   Moves extremities without difficulty  Other:  Generalized abdominal tenderness to palpation  Medical Decision Making  Medically screening exam initiated at 9:29 AM.  Appropriate orders placed.  Gianfranco Araki was informed that the remainder of the evaluation will be completed by another provider, this initial triage assessment does not replace that evaluation, and the importance of remaining in the ED until their evaluation is complete.     Bud Face, PA-C 11/22/21 1224

## 2021-11-22 NOTE — Assessment & Plan Note (Signed)
   Patient is lethargic throughout the interview and is extremely angry whenever he is aroused  We will attempt to revisit this topic as patient is more awake and alert and counseled patient on cessation  Unclear as to how much alcohol patient is currently drinking as he is unwilling to share this.  We will place patient on CIWA protocol in the meantime.

## 2021-11-22 NOTE — Assessment & Plan Note (Signed)
   Patient presenting with hemoglobin of 11.7, down from what appears to be a baseline in May of between 14 and 15.  Later in the emergency department course hemoglobin has dropped to 9.5.  Patient reports no bright red blood in the stool or melena patient is not taking any blood thinners but does drink alcohol regularly  Rectal exam in the emergency department reveals no evidence of gross blood in stool Hemoccult is negative  I believe that a good portion of the patient's anemia is due to anemia of chronic disease in the setting of longstanding poorly managed HIV  With drop from 11.7-9.5 here in the emergency room may be simply dilutional  We will monitor for any clinical evidence of bleeding, obtain serial CBCs  If hemoglobin continues to precipitously drop will obtain GI consultation  Pending iron panel, B12, folate

## 2021-11-22 NOTE — ED Notes (Signed)
Unable to get labs ?

## 2021-11-22 NOTE — ED Triage Notes (Signed)
Patient arrived by [facility] with complaint of generalized abdominal pain x 1 hour with no associated symptoms. Homeless and no HIV meds x 1 year per ems. Patient drinks etoh several times a week

## 2021-11-22 NOTE — Assessment & Plan Note (Signed)
   Patient presenting with severe generalized abdominal pain cramping and watery diarrhea  Presentation complicated by presence of longstanding HIV without ongoing HAART therapy so atypical infectious processes must be considered  CT imaging of the abdomen pelvis performed in the emergency department does not seem to identify a definitive cause of the patient's discomfort  Urinalysis, lipase and hepatic function panel unremarkable  Abdominal pain may be primarily due to abdominal cramping from acute diarrhea and may be self-limiting  Hydrating patient with intravenous isotonic fluids  As needed Bentyl for symptoms  Clear liquid diet  Obtaining stool studies including GI pathogen panel and C. difficile testing

## 2021-11-22 NOTE — Assessment & Plan Note (Signed)
·   Please see assessment and plan above °

## 2021-11-22 NOTE — Assessment & Plan Note (Signed)
   Longstanding history of bipolar disorder  Patient is not currently taking any medications  We will place psychiatry consultation for recommendations on psychotropic regimen going forward

## 2021-11-23 ENCOUNTER — Observation Stay (HOSPITAL_COMMUNITY): Payer: Medicaid Other

## 2021-11-23 DIAGNOSIS — M25512 Pain in left shoulder: Secondary | ICD-10-CM | POA: Diagnosis present

## 2021-11-23 DIAGNOSIS — D638 Anemia in other chronic diseases classified elsewhere: Secondary | ICD-10-CM | POA: Diagnosis present

## 2021-11-23 DIAGNOSIS — R197 Diarrhea, unspecified: Secondary | ICD-10-CM | POA: Diagnosis present

## 2021-11-23 DIAGNOSIS — R1084 Generalized abdominal pain: Secondary | ICD-10-CM | POA: Diagnosis present

## 2021-11-23 DIAGNOSIS — R188 Other ascites: Secondary | ICD-10-CM | POA: Diagnosis present

## 2021-11-23 DIAGNOSIS — E869 Volume depletion, unspecified: Secondary | ICD-10-CM | POA: Diagnosis present

## 2021-11-23 DIAGNOSIS — B20 Human immunodeficiency virus [HIV] disease: Secondary | ICD-10-CM | POA: Diagnosis present

## 2021-11-23 DIAGNOSIS — Z79899 Other long term (current) drug therapy: Secondary | ICD-10-CM | POA: Diagnosis not present

## 2021-11-23 DIAGNOSIS — F3160 Bipolar disorder, current episode mixed, unspecified: Secondary | ICD-10-CM | POA: Diagnosis present

## 2021-11-23 DIAGNOSIS — Y9355 Activity, bike riding: Secondary | ICD-10-CM | POA: Diagnosis not present

## 2021-11-23 DIAGNOSIS — Z91148 Patient's other noncompliance with medication regimen for other reason: Secondary | ICD-10-CM | POA: Diagnosis not present

## 2021-11-23 DIAGNOSIS — Z6372 Alcoholism and drug addiction in family: Secondary | ICD-10-CM | POA: Diagnosis not present

## 2021-11-23 DIAGNOSIS — Z7951 Long term (current) use of inhaled steroids: Secondary | ICD-10-CM | POA: Diagnosis not present

## 2021-11-23 DIAGNOSIS — Z818 Family history of other mental and behavioral disorders: Secondary | ICD-10-CM | POA: Diagnosis not present

## 2021-11-23 DIAGNOSIS — S36039A Unspecified laceration of spleen, initial encounter: Secondary | ICD-10-CM | POA: Diagnosis present

## 2021-11-23 DIAGNOSIS — Z91013 Allergy to seafood: Secondary | ICD-10-CM | POA: Diagnosis not present

## 2021-11-23 DIAGNOSIS — F17210 Nicotine dependence, cigarettes, uncomplicated: Secondary | ICD-10-CM | POA: Diagnosis present

## 2021-11-23 DIAGNOSIS — I10 Essential (primary) hypertension: Secondary | ICD-10-CM | POA: Diagnosis present

## 2021-11-23 DIAGNOSIS — Z811 Family history of alcohol abuse and dependence: Secondary | ICD-10-CM | POA: Diagnosis not present

## 2021-11-23 DIAGNOSIS — Z5900 Homelessness unspecified: Secondary | ICD-10-CM | POA: Diagnosis not present

## 2021-11-23 DIAGNOSIS — J4520 Mild intermittent asthma, uncomplicated: Secondary | ICD-10-CM | POA: Diagnosis present

## 2021-11-23 DIAGNOSIS — S36899A Unspecified injury of other intra-abdominal organs, initial encounter: Secondary | ICD-10-CM | POA: Diagnosis present

## 2021-11-23 DIAGNOSIS — F1410 Cocaine abuse, uncomplicated: Secondary | ICD-10-CM | POA: Diagnosis present

## 2021-11-23 LAB — CBC WITH DIFFERENTIAL/PLATELET
Abs Immature Granulocytes: 0.03 10*3/uL (ref 0.00–0.07)
Basophils Absolute: 0 10*3/uL (ref 0.0–0.1)
Basophils Relative: 0 %
Eosinophils Absolute: 0.1 10*3/uL (ref 0.0–0.5)
Eosinophils Relative: 1 %
HCT: 25.6 % — ABNORMAL LOW (ref 39.0–52.0)
Hemoglobin: 8.8 g/dL — ABNORMAL LOW (ref 13.0–17.0)
Immature Granulocytes: 0 %
Lymphocytes Relative: 22 %
Lymphs Abs: 1.6 10*3/uL (ref 0.7–4.0)
MCH: 31.4 pg (ref 26.0–34.0)
MCHC: 34.4 g/dL (ref 30.0–36.0)
MCV: 91.4 fL (ref 80.0–100.0)
Monocytes Absolute: 0.6 10*3/uL (ref 0.1–1.0)
Monocytes Relative: 8 %
Neutro Abs: 5.1 10*3/uL (ref 1.7–7.7)
Neutrophils Relative %: 69 %
Platelets: 264 10*3/uL (ref 150–400)
RBC: 2.8 MIL/uL — ABNORMAL LOW (ref 4.22–5.81)
RDW: 13.3 % (ref 11.5–15.5)
WBC: 7.4 10*3/uL (ref 4.0–10.5)
nRBC: 0 % (ref 0.0–0.2)

## 2021-11-23 LAB — COMPREHENSIVE METABOLIC PANEL
ALT: 11 U/L (ref 0–44)
AST: 14 U/L — ABNORMAL LOW (ref 15–41)
Albumin: 2.9 g/dL — ABNORMAL LOW (ref 3.5–5.0)
Alkaline Phosphatase: 53 U/L (ref 38–126)
Anion gap: 7 (ref 5–15)
BUN: 9 mg/dL (ref 6–20)
CO2: 22 mmol/L (ref 22–32)
Calcium: 8 mg/dL — ABNORMAL LOW (ref 8.9–10.3)
Chloride: 107 mmol/L (ref 98–111)
Creatinine, Ser: 0.68 mg/dL (ref 0.61–1.24)
GFR, Estimated: >60 mL/min (ref >60)
Glucose, Bld: 108 mg/dL — ABNORMAL HIGH (ref 70–99)
Potassium: 3.3 mmol/L — ABNORMAL LOW (ref 3.5–5.1)
Sodium: 136 mmol/L (ref 135–145)
Total Bilirubin: 0.8 mg/dL (ref 0.3–1.2)
Total Protein: 5.9 g/dL — ABNORMAL LOW (ref 6.5–8.1)

## 2021-11-23 LAB — IRON AND TIBC
Iron: 38 ug/dL — ABNORMAL LOW (ref 45–182)
Saturation Ratios: 16 % — ABNORMAL LOW (ref 17.9–39.5)
TIBC: 245 ug/dL — ABNORMAL LOW (ref 250–450)
UIBC: 207 ug/dL

## 2021-11-23 LAB — HEPATITIS PANEL, ACUTE
HCV Ab: NONREACTIVE
Hep A IgM: NONREACTIVE
Hep B C IgM: NONREACTIVE
Hepatitis B Surface Ag: NONREACTIVE

## 2021-11-23 LAB — MAGNESIUM: Magnesium: 1.6 mg/dL — ABNORMAL LOW (ref 1.7–2.4)

## 2021-11-23 LAB — TYPE AND SCREEN
ABO/RH(D): O POS
Antibody Screen: NEGATIVE

## 2021-11-23 LAB — PROTIME-INR
INR: 1 (ref 0.8–1.2)
Prothrombin Time: 13.3 seconds (ref 11.4–15.2)

## 2021-11-23 LAB — APTT: aPTT: 27 seconds (ref 24–36)

## 2021-11-23 LAB — FOLATE: Folate: 13.5 ng/mL (ref >5.9)

## 2021-11-23 LAB — T-HELPER CELLS (CD4) COUNT (NOT AT ARMC)
CD4 % Helper T Cell: 26 % — ABNORMAL LOW (ref 33–65)
CD4 T Cell Abs: 470 /uL (ref 400–1790)

## 2021-11-23 LAB — ABO/RH: ABO/RH(D): O POS

## 2021-11-23 LAB — VITAMIN B12: Vitamin B-12: 191 pg/mL (ref 180–914)

## 2021-11-23 MED ORDER — MORPHINE SULFATE (PF) 2 MG/ML IV SOLN
2.0000 mg | INTRAVENOUS | Status: DC | PRN
  Administered 2021-11-23: 2 mg via INTRAVENOUS
  Filled 2021-11-23 (×2): qty 1

## 2021-11-23 MED ORDER — QUETIAPINE FUMARATE 50 MG PO TABS: 100.0000 mg | ORAL_TABLET | Freq: Every evening | ORAL | Status: DC | PRN

## 2021-11-23 MED ORDER — VITAMIN B-12 1000 MCG PO TABS
1000.0000 ug | ORAL_TABLET | Freq: Every day | ORAL | Status: DC
  Administered 2021-11-24 – 2021-11-26 (×3): 1000 ug via ORAL
  Filled 2021-11-23 (×3): qty 1

## 2021-11-23 MED ORDER — BUSPIRONE HCL 5 MG PO TABS
5.0000 mg | ORAL_TABLET | Freq: Two times a day (BID) | ORAL | Status: DC
  Administered 2021-11-23 – 2021-11-26 (×6): 5 mg via ORAL
  Filled 2021-11-23 (×6): qty 1

## 2021-11-23 MED ORDER — MIRTAZAPINE 15 MG PO TABS: 15.0000 mg | ORAL_TABLET | Freq: Every evening | ORAL | Status: DC | PRN

## 2021-11-23 MED ORDER — OXYCODONE HCL 5 MG PO TABS
5.0000 mg | ORAL_TABLET | ORAL | Status: DC | PRN
  Administered 2021-11-23 – 2021-11-24 (×6): 5 mg via ORAL
  Filled 2021-11-23 (×6): qty 1

## 2021-11-23 NOTE — Progress Notes (Signed)
Received patient from ED, alert but sleepy, able to answer some questions but refused to answer admission questions. He said that his last alcohol intake was today morning and last cocaine and marijuana use was yesterday evening. Still complaining of abdominal pain and nausea,CIWA scoreof 6, PRN meds given. He told that he don't walk but use wheelchair, and he fell down 3 days ago in the sidewalk.

## 2021-11-23 NOTE — Consult Note (Signed)
[facility] for Infectious Disease    Date of Admission:  11/22/2021   Total days of inpatient antibiotics 0        Reason for Consult: HIV   Principal Problem:   Acute diarrhea Active Problems:   Generalized abdominal pain   HIV (human immunodeficiency virus infection) ([place])   Polysubstance abuse ([facility])   Mild intermittent asthma without complication   Bipolar disorder, mixed ([place])   Normocytic anemia   Assessment: 51 YM with admitted with:  #GI symptoms of diarrhea/abdominal pain with N/V #Abdominopelvic ascites #HIV not ART Showed no evidence of thoracoabdominal aortic aneurysm or dissection, no evidence of pulmonary embolism, noted moderate abdominal pelvic ascites.  Of of note liver/gallbladder was poorly visualized.  As such we will get retrocardiac ultrasound. -Followed by [facility] for HIV care, missed multiple appointments with Terri Piedra -Pt reports abdomina pain started a couple days ago radiating to left shoulder.He does not know how long he has had diarrhea. Reports spoke crack/cocaine of day of admission.  Recommendations: - Start Biktarvy - HIV VL, hep panel - RUQ U/S, if negative then would get MRI abdomen pelvis.  -If pt undergoes paracentesis/aspiration of fluid please obtain fungal, AFB, bacterial Cx and pathology -Follow-up GIP and C. Difficile  #Anemia -Hgb baseline around 13, 8.8 today -No bloody stools noted -Imaging as above, pending imaging results consider engaging GI Microbiology:     HPI: John [name] is a 51 y.o. male asthma, HIV not currently taking Biktarvy, polysubstance abuse with alcohol marijuana and cocaine, BPD admitted for acute diarrhea.  Patient was experiencing generalized abdominal pain which began on 9/9.  He had accompanied nausea and generalized weakness.  Denies fevers or chills.  In the ED patient had concern for volume depletion, received IV fluids.  Hospitalized due to abdominal pain, intractable nausea and  vomiting.  Of note his hemoglobin went from 11.7-9.5 with IV fluids, 15.6 about 2 months ago   Review of Systems: Review of Systems  All other systems reviewed and are negative.   Past Medical History:  Diagnosis Date   Acute hypoxemic respiratory failure ([place]) 05/07/2021   Adjustment disorder with depressed mood 10/28/2015   Alcoholism ([place])    Asthma    Bipolar disorder ([place])    with depression/anxiety   Cannabis use disorder, moderate, dependence ([place]) 04/02/2015   Chronic low back pain    Cocaine use disorder ([place]) 04/02/2015   Gout    HIV (human immunodeficiency virus infection) ([place])    "dx'd ~ 2 yr ago" (09/29/2012)   Homelessness    Hypertension    Polysubstance abuse ([place]) 04/11/2019    Social History   Tobacco Use   Smoking status: Every Day    Packs/day: 1.00    Years: 27.00    Total pack years: 27.00    Types: Cigarettes   Smokeless tobacco: Never  Vaping Use   Vaping Use: Never used  Substance Use Topics   Alcohol use: Yes    Alcohol/week: 12.0 standard drinks of alcohol    Types: 12 Cans of beer per week    Comment: h/o heavy use, currently "very little" - "no more than 6 swallows of beer"   Drug use: Yes    Types: Marijuana, "Crack" cocaine    Comment: Marijuana as often as I can.  Crack maybe a couple of times a week.    Family History  Problem Relation Age of Onset   Alcoholism Mother  Depression Mother    Alcoholism Brother    Scheduled Meds:  folic acid  1 mg Oral Daily   multivitamin with minerals  1 tablet Oral Daily   thiamine  100 mg Oral Daily   Or   thiamine  100 mg Intravenous Daily   Continuous Infusions:  lactated ringers 125 mL/hr at 11/23/21 0547   PRN Meds:.acetaminophen **OR** acetaminophen, albuterol, dicyclomine, LORazepam **OR** LORazepam, mirtazapine, ondansetron **OR** ondansetron (ZOFRAN) IV, oxyCODONE, QUEtiapine Allergies  Allergen Reactions   Shellfish Allergy Anaphylaxis and Swelling    OBJECTIVE: Blood  pressure 121/87, pulse 98, temperature 97.6 F (36.4 C), temperature source Oral, resp. rate 16, height '5\' 5"'$  (1.651 m), weight 64.2 kg, SpO2 100 %.  Physical Exam Constitutional:      General: He is not in acute distress.    Appearance: He is normal weight. He is not toxic-appearing.  HENT:     Head: Normocephalic and atraumatic.     Right Ear: External ear normal.     Left Ear: External ear normal.     Nose: No congestion or rhinorrhea.     Mouth/Throat:     Mouth: Mucous membranes are moist.     Pharynx: Oropharynx is clear.  Eyes:     Extraocular Movements: Extraocular movements intact.     Conjunctiva/sclera: Conjunctivae normal.     Pupils: Pupils are equal, round, and reactive to light.  Cardiovascular:     Rate and Rhythm: Normal rate and regular rhythm.     Heart sounds: No murmur heard.    No friction rub. No gallop.  Pulmonary:     Effort: Pulmonary effort is normal.     Breath sounds: Normal breath sounds.  Abdominal:     General: Bowel sounds are normal. There is distension.  Musculoskeletal:        General: No swelling. Normal range of motion.     Cervical back: Normal range of motion and neck supple.  Skin:    General: Skin is warm and dry.  Neurological:     General: No focal deficit present.     Mental Status: He is oriented to person, place, and time.  Psychiatric:        Mood and Affect: Mood normal.     Lab Results Lab Results  Component Value Date   WBC 7.4 11/23/2021   HGB 8.8 (L) 11/23/2021   HCT 25.6 (L) 11/23/2021   MCV 91.4 11/23/2021   PLT 264 11/23/2021    Lab Results  Component Value Date   CREATININE 0.68 11/23/2021   BUN 9 11/23/2021   NA 136 11/23/2021   K 3.3 (L) 11/23/2021   CL 107 11/23/2021   CO2 22 11/23/2021    Lab Results  Component Value Date   ALT 11 11/23/2021   AST 14 (L) 11/23/2021   ALKPHOS 53 11/23/2021   BILITOT 0.8 11/23/2021       Laurice Record, [place] [facility] 11/23/2021, 2:39 PM

## 2021-11-23 NOTE — Progress Notes (Addendum)
PROGRESS NOTE    John [name]  ZJI:[id number] DOB: [birth year] DOA: 11/22/2021 PCP: Pcp, No  Chief Complaint  Patient presents with   Abdominal Pain    Brief Narrative:  51 year old male with past medical history of asthma, HIV, polysubstance abuse including alcohol, marijuana and cocaine, nicotine dependence, bipolar disorder and medication noncompliance who presents to [facility] emergency department with complaints of nausea vomiting and abdominal pain.   Patient explains that he has been experiencing generalized abdominal pain.  Patient states that this abdominal pain began on 9/9.  Patient describes abdominal pain as generalized in location, radiating diffusely, cramping in quality and severe in intensity.  Over the following 24 hours patient began to experience worsening nausea and generalized weakness.  Symptoms persisted overnight and patient reports that starting earlier in the day on 9/9 began to experience diarrhea.  Patient states that these episodes of watery diarrhea and no associated blood or melena.  Patient denies fever, sick contacts, recent ingestion of undercooked food, recent travel or contacts with confirmed COVID-19 infection.   Patient's symptoms continue to worsen until he presented to [facility] emergency room for evaluation.   Upon evaluation in the emergency department patient was found to be clinically volume depleted and initiated on intravenous fluids.  Initial CBC revealed John [name] hemoglobin of 11.7, down from 15.6 two months prior.  Rectal exam revealed no evidence of gross blood with occult blood testing being negative.  After hydration repeat CBC revealed John [name] hemoglobin of 9.5.  EDP questing hospitalization due to abdominal pain with intractable nausea and vomiting as well as concerns for anemia.  Hospitalist group is now been called to assess patient for admission to the hospital.  Assessment & Plan:   Principal Problem:   Acute diarrhea Active  Problems:   Generalized abdominal pain   Normocytic anemia   HIV (human immunodeficiency virus infection) (HCC)   Mild intermittent asthma without complication   Polysubstance abuse (HCC)   Bipolar disorder, mixed (HCC)   Assessment and Plan: * Acute diarrhea Pending GI pathogen and c diff CT as noted below with ascites, no other notable findings ADAT IVF   Generalized abdominal pain CT showing moderate acscites Follow paracentesis Pain management   Normocytic anemia Downtrending Hb Suspect dilutional and AOCD Low normal B12 -> follow MMA - labs otherwise c/w iron def, ferritin pending  Mild intermittent asthma without complication No evidence of acute asthma exacerbation As needed bronchodilator therapy for shortness of breath and wheezing.  HIV (human immunodeficiency virus infection) ([place]) Longstanding history of HIV with medication noncompliance ID c/s, appreciate recs, resuming biktarvy Follow HIV VL, hepatitis panel (negative)  Polysubstance abuse ([place]) UDS with cocaine and THC Unclear if he's drinking Continue CIWA Bipolar disorder, mixed ([place]) Longstanding history of bipolar disorder 09/03/2021 seen by Darrol Angel NP who recommended buspar 5 mg BID, seroquel 100 mg qhs, remeron 15 mg qhs Will continue these meds at this time, needs outpatient psych follow up      DVT prophylaxis: SCD Code Status: full Family Communication: none Disposition:   Status is: Observation The patient remains OBS appropriate and will d/c before 2 midnights.   Consultants:  ID IR  Procedures:  none  Antimicrobials:  Anti-infectives (From admission, onward)    None       Subjective: C/o continued abdominal pain  Objective: Vitals:   11/23/21 0025 11/23/21 0448 11/23/21 0938 11/23/21 1623  BP:  135/84 121/87 121/72  Pulse:  89 98 91  Resp:  $'19 16 16  'X$ Temp:  98.9 F (37.2 C) 97.6 F (36.4 C) 99.6 F (37.6 C)  TempSrc:  Oral Oral Oral  SpO2:  100% 100%  97%  Weight: 64.2 kg     Height: '5\' 5"'$  (1.651 m)       Intake/Output Summary (Last 24 hours) at 11/23/2021 1743 Last data filed at 11/23/2021 0300 Gross per 24 hour  Intake 1449.98 ml  Output --  Net 1449.98 ml   Filed Weights   11/23/21 0025  Weight: 64.2 kg    Examination:  General exam: Appears calm and comfortable  Respiratory system: Clear to auscultation. Respiratory effort normal. Cardiovascular system: S1 & S2 heard, RRR. No JVD, murmurs, rubs, gallops or clicks. No pedal edema. Gastrointestinal system: mild diffuse TTP Central nervous system: sleepy, awakens and follows commands. No focal neurological deficits. Extremities: no LEE    Data Reviewed: I have personally reviewed following labs and imaging studies  CBC: Recent Labs  Lab 11/22/21 1700 11/22/21 1925 11/23/21 0109  WBC 8.3 8.0 7.4  NEUTROABS 5.9  --  5.1  HGB 11.7* 9.5* 8.8*  HCT 36.0* 29.2* 25.6*  MCV 96.0 95.7 91.4  PLT 324 272 829    Basic Metabolic Panel: Recent Labs  Lab 11/22/21 1700 11/23/21 0109  NA 139 136  K 4.0 3.3*  CL 106 107  CO2 24 22  GLUCOSE 110* 108*  BUN 14 9  CREATININE 0.92 0.68  CALCIUM 8.7* 8.0*  MG  --  1.6*    GFR: Estimated Creatinine Clearance: 96.1 mL/min (by C-G formula based on SCr of 0.68 mg/dL).  Liver Function Tests: Recent Labs  Lab 11/22/21 1700 11/23/21 0109  AST 19 14*  ALT 12 11  ALKPHOS 68 53  BILITOT 0.3 0.8  PROT 7.2 5.9*  ALBUMIN 3.5 2.9*    CBG: No results for input(s): "GLUCAP" in the last 168 hours.   No results found for this or any previous visit (from the past 240 hour(s)).       Radiology Studies: US Abdomen Limited RUQ (LIVER/GB)  Result Date: 11/23/2021 CLINICAL DATA:  Abdominal pain. EXAM: ULTRASOUND ABDOMEN LIMITED RIGHT UPPER QUADRANT COMPARISON:  CT abdomen and pelvis without contrast FINDINGS: Gallbladder: No gallstones or wall thickening visualized. No sonographic Murphy sign noted by sonographer. Common  bile duct: Diameter: 5 mm, within normal limits. No intrahepatic or extrahepatic biliary ductal dilatation. Liver: No focal lesion identified. Within normal limits in parenchymal echogenicity. Portal vein is patent on color Doppler imaging with normal direction of blood flow towards the liver. Other: None. IMPRESSION: Normal right upper quadrant abdominal ultrasound. Electronically Signed   By: Yvonne Kendall M.D.   On: 11/23/2021 15:39   CT Angio Chest/Abd/Pel for Dissection W and/or Wo Contrast  Result Date: 11/22/2021 CLINICAL DATA:  Abdominal pain, ETOH, HIV. EXAM: CT ANGIOGRAPHY CHEST, ABDOMEN AND PELVIS TECHNIQUE: Non-contrast CT of the chest was initially obtained. Multidetector CT imaging through the chest, abdomen and pelvis was performed using the standard protocol during bolus administration of intravenous contrast. Multiplanar reconstructed images and MIPs were obtained and reviewed to evaluate the vascular anatomy. RADIATION DOSE REDUCTION: This exam was performed according to the departmental dose-optimization program which includes automated exposure control, adjustment of the mA and/or kV according to patient size and/or use of iterative reconstruction technique. CONTRAST:  136m OMNIPAQUE IOHEXOL 350 MG/ML SOLN COMPARISON:  CT chest dated 08/07/2021. CT abdomen/pelvis dated 12/19/2019. FINDINGS: CTA CHEST FINDINGS Cardiovascular: Unenhanced CT, there is no evidence of maternal  hematoma. Following contrast administration, there is no evidence of thoracic aortic aneurysm or dissection. Although not tailored for evaluation of the pulmonary arteries, there is no evidence of pulmonary embolism to the lobar level. The heart is normal in size.  No pericardial effusion. Mediastinum/Nodes: No suspicious mediastinal lymphadenopathy. Visualized thyroid is unremarkable. Lungs/Pleura: Evaluated lung parenchyma is markedly constrained by respiratory motion. Within that constraint, there are no suspicious  pulmonary nodules. No focal consolidation. Mild paraseptal emphysematous changes of the lung apices. Mild biapical pleural-parenchymal scarring. No pleural effusion or pneumothorax. Musculoskeletal: Mild degenerative changes of the mid/lower thoracic spine. Review of the MIP images confirms the above findings. CTA ABDOMEN AND PELVIS FINDINGS VASCULAR Aorta: No evidence of abdominal aortic aneurysm or dissection. Patent. Celiac: Patent. SMA: Patent. Renals: Patent bilaterally. IMA: Patent. Inflow: Patent bilaterally. Veins: Unremarkable. Review of the MIP images confirms the above findings. NON-VASCULAR Motion degraded images. Hepatobiliary: Liver is poorly evaluated but grossly unremarkable. Suspected focal fat/altered perfusion adjacent to the falciform ligament (series 7/image 171). Gallbladder is poorly evaluated but grossly unremarkable. Pancreas: Within normal limits. Spleen: Heterogeneous perfusion of the spleen, likely related to phase of enhancement. Adrenals/Urinary Tract: Adrenal glands are within normal limits. Kidneys are within normal limits.  No hydronephrosis. Bladder is underdistended and grossly unremarkable. Stomach/Bowel: Stomach is grossly unremarkable. No evidence of bowel obstruction. Visualized bowel otherwise poorly evaluated. Lymphatic: No suspicious abdominopelvic lymphadenopathy, noting motion degradation. Reproductive: Prostate is unremarkable. Other: Moderate abdominopelvic ascites. Tiny fat containing bilateral inguinal hernias (series 7/image 289). Musculoskeletal: Mild degenerative changes of the lumbar spine. Review of the MIP images confirms the above findings. IMPRESSION: No evidence of thoracoabdominal aortic aneurysm or dissection. No evidence of central pulmonary embolism. Moderate abdominopelvic ascites. Electronically Signed   By: Julian Hy M.D.   On: 11/22/2021 20:19   DG Chest Portable 1 View  Result Date: 11/22/2021 CLINICAL DATA:  Acute chest pain. EXAM:  PORTABLE CHEST 1 VIEW COMPARISON:  09/02/2021 and prior studies FINDINGS: The cardiomediastinal silhouette is unchanged. There is no evidence of focal airspace disease, pulmonary edema, suspicious pulmonary nodule/mass, pleural effusion, or pneumothorax. No acute bony abnormalities are identified. IMPRESSION: No active disease. Electronically Signed   By: Margarette Canada M.D.   On: 11/22/2021 16:28        Scheduled Meds:  folic acid  1 mg Oral Daily   multivitamin with minerals  1 tablet Oral Daily   thiamine  100 mg Oral Daily   Or   thiamine  100 mg Intravenous Daily   Continuous Infusions:  lactated ringers 125 mL/hr at 11/23/21 1452     LOS: 0 days    Time spent: over 30 min    Fayrene Helper, MD [facility]   To contact the attending provider between 7A-7P or the covering provider during after hours 7P-7A, please log into the web site [email or web address] and access using universal [place] password for that web site. If you do not have the password, please call the hospital operator.  11/23/2021, 5:43 PM

## 2021-11-24 ENCOUNTER — Encounter (HOSPITAL_COMMUNITY): Payer: Self-pay | Admitting: Family Medicine

## 2021-11-24 ENCOUNTER — Inpatient Hospital Stay (HOSPITAL_COMMUNITY): Payer: Medicaid Other

## 2021-11-24 DIAGNOSIS — R197 Diarrhea, unspecified: Secondary | ICD-10-CM | POA: Diagnosis not present

## 2021-11-24 LAB — COMPREHENSIVE METABOLIC PANEL
ALT: 11 U/L (ref 0–44)
AST: 11 U/L — ABNORMAL LOW (ref 15–41)
Albumin: 2.7 g/dL — ABNORMAL LOW (ref 3.5–5.0)
Alkaline Phosphatase: 49 U/L (ref 38–126)
Anion gap: 8 (ref 5–15)
BUN: 5 mg/dL — ABNORMAL LOW (ref 6–20)
CO2: 23 mmol/L (ref 22–32)
Calcium: 8.1 mg/dL — ABNORMAL LOW (ref 8.9–10.3)
Chloride: 105 mmol/L (ref 98–111)
Creatinine, Ser: 0.73 mg/dL (ref 0.61–1.24)
GFR, Estimated: >60 mL/min (ref >60)
Glucose, Bld: 125 mg/dL — ABNORMAL HIGH (ref 70–99)
Potassium: 3.1 mmol/L — ABNORMAL LOW (ref 3.5–5.1)
Sodium: 136 mmol/L (ref 135–145)
Total Bilirubin: 0.7 mg/dL (ref 0.3–1.2)
Total Protein: 5.7 g/dL — ABNORMAL LOW (ref 6.5–8.1)

## 2021-11-24 LAB — CBC WITH DIFFERENTIAL/PLATELET
Abs Immature Granulocytes: 0.02 10*3/uL (ref 0.00–0.07)
Basophils Absolute: 0 10*3/uL (ref 0.0–0.1)
Basophils Relative: 0 %
Eosinophils Absolute: 0.2 10*3/uL (ref 0.0–0.5)
Eosinophils Relative: 3 %
HCT: 25.2 % — ABNORMAL LOW (ref 39.0–52.0)
Hemoglobin: 8.3 g/dL — ABNORMAL LOW (ref 13.0–17.0)
Immature Granulocytes: 0 %
Lymphocytes Relative: 25 %
Lymphs Abs: 1.5 10*3/uL (ref 0.7–4.0)
MCH: 30.6 pg (ref 26.0–34.0)
MCHC: 32.9 g/dL (ref 30.0–36.0)
MCV: 93 fL (ref 80.0–100.0)
Monocytes Absolute: 0.7 10*3/uL (ref 0.1–1.0)
Monocytes Relative: 11 %
Neutro Abs: 3.8 10*3/uL (ref 1.7–7.7)
Neutrophils Relative %: 61 %
Platelets: 241 10*3/uL (ref 150–400)
RBC: 2.71 MIL/uL — ABNORMAL LOW (ref 4.22–5.81)
RDW: 12.9 % (ref 11.5–15.5)
WBC: 6.2 10*3/uL (ref 4.0–10.5)
nRBC: 0 % (ref 0.0–0.2)

## 2021-11-24 LAB — HEPATITIS A ANTIBODY, TOTAL: hep A Total Ab: NONREACTIVE

## 2021-11-24 LAB — HIV-1 RNA QUANT-NO REFLEX-BLD
HIV 1 RNA Quant: 420 copies/mL
LOG10 HIV-1 RNA: 2.623 log10copy/mL

## 2021-11-24 LAB — FERRITIN: Ferritin: 91 ng/mL (ref 24–336)

## 2021-11-24 LAB — MAGNESIUM: Magnesium: 1.4 mg/dL — ABNORMAL LOW (ref 1.7–2.4)

## 2021-11-24 LAB — PHOSPHORUS: Phosphorus: 2.1 mg/dL — ABNORMAL LOW (ref 2.5–4.6)

## 2021-11-24 MED ORDER — BICTEGRAVIR-EMTRICITAB-TENOFOV 50-200-25 MG PO TABS
1.0000 | ORAL_TABLET | Freq: Every day | ORAL | Status: DC
  Administered 2021-11-24 – 2021-11-26 (×3): 1 via ORAL
  Filled 2021-11-24 (×3): qty 1

## 2021-11-24 MED ORDER — LIDOCAINE HCL 1 % IJ SOLN
INTRAMUSCULAR | Status: AC
  Filled 2021-11-24: qty 20

## 2021-11-24 MED ORDER — ADULT MULTIVITAMIN W/MINERALS CH
1.0000 | ORAL_TABLET | Freq: Every day | ORAL | Status: DC
  Administered 2021-11-25: 1 via ORAL
  Filled 2021-11-24: qty 1

## 2021-11-24 MED ORDER — MAGNESIUM SULFATE 4 GM/100ML IV SOLN
4.0000 g | Freq: Once | INTRAVENOUS | Status: AC
  Administered 2021-11-24: 4 g via INTRAVENOUS
  Filled 2021-11-24: qty 100

## 2021-11-24 MED ORDER — ADULT MULTIVITAMIN W/MINERALS CH: 1.0000 | ORAL_TABLET | Freq: Every day | ORAL | Status: DC

## 2021-11-24 MED ORDER — IOHEXOL 350 MG/ML SOLN
100.0000 mL | Freq: Once | INTRAVENOUS | Status: AC | PRN
  Administered 2021-11-24: 100 mL via INTRAVENOUS

## 2021-11-24 MED ORDER — POTASSIUM PHOSPHATES 15 MMOLE/5ML IV SOLN
30.0000 mmol | Freq: Once | INTRAVENOUS | Status: AC
  Administered 2021-11-24: 30 mmol via INTRAVENOUS
  Filled 2021-11-24: qty 10

## 2021-11-24 MED ORDER — MORPHINE SULFATE (PF) 2 MG/ML IV SOLN
2.0000 mg | INTRAVENOUS | Status: DC | PRN
  Administered 2021-11-24 – 2021-11-26 (×10): 2 mg via INTRAVENOUS
  Filled 2021-11-24 (×9): qty 1
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
## 2021-11-24 NOTE — Progress Notes (Signed)
[facility]  Date of Admission:  11/22/2021   Total days of inpatient antibiotics 0  Principal Problem:   Acute diarrhea Active Problems:   Generalized abdominal pain   HIV (human immunodeficiency virus infection) (HCC)   Polysubstance abuse (HCC)   Mild intermittent asthma without complication   Bipolar disorder, mixed (HCC)   Normocytic anemia   Diarrhea          Assessment: 72 YM with admitted with:  #GI symptoms of diarrhea/abdominal pain with N/V #Abdominopelvic ascites #HIV not ART CT showed no evidence of thoracoabdominal aortic aneurysm or dissection, no evidence of pulmonary embolism, noted moderate abdominal pelvic ascites.  Of of note liver/gallbladder was poorly visualized.  As such we will get retrocardiac ultrasound. -Followed by [facility] for HIV care, missed multiple appointments with Terri Piedra -Pt reports abdomina pain started a couple days ago radiating to left shoulder.He does not know how long he has had diarrhea, now resolved. Reports spoke crack/cocaine of day of admission. -RUQ U/S was unremarkable. Abdominal pain out of proportion to physical exam,  given cocaine history would like to rule out ischemic bowel. Spoke with [facility] and CT w and w/o would be best.   Recommendations: - Start Biktarvy - HIV VL - CT w and w/o -Plan on paracentesis today please obtain fungal, AFB, bacterial Cx and pathology -D/C GIP and C. Difficile as pt no longer has diarrhea   #Anemia -Hgb baseline around 13, 8.8 today -No bloody stools noted -Imaging as above, pending imaging results consider engaging GI   #Hep B non-immune -1st dose of HBV vaccine on  discharge -HAV total  Microbiology:     SUBJECTIVE: Resting in bed. Reports abdominal pain.  Review of Systems: Review of Systems  All other systems reviewed and are negative.    Scheduled Meds:  bictegravir-emtricitabine-tenofovir AF  1 tablet Oral Daily   busPIRone  5 mg Oral  BID   vitamin B-12  1,000 mcg Oral Daily   folic acid  1 mg Oral Daily   lidocaine       [START ON 11/25/2021] multivitamin with minerals  1 tablet Oral Daily   thiamine  100 mg Oral Daily   Continuous Infusions:  magnesium sulfate bolus IVPB     potassium PHOSPHATE IVPB (in mmol)     PRN Meds:.acetaminophen **OR** acetaminophen, albuterol, dicyclomine, lidocaine, LORazepam **OR** LORazepam, mirtazapine, morphine injection, ondansetron **OR** ondansetron (ZOFRAN) IV, oxyCODONE, QUEtiapine Allergies  Allergen Reactions   Shellfish Allergy Anaphylaxis and Swelling    OBJECTIVE: Vitals:   11/23/21 2120 11/24/21 0558 11/24/21 0613 11/24/21 0758  BP:  94/75 122/83 121/80  Pulse:  (!) 104 93 84  Resp:  20  17  Temp: 100 F (37.8 C) 98.2 F (36.8 C)  99.2 F (37.3 C)  TempSrc: Oral Oral  Oral  SpO2:  94%    Weight:      Height:       Body mass index is 23.55 kg/m.  Physical Exam Constitutional:      General: He is not in acute distress.    Appearance: He is normal weight. He is not toxic-appearing.  HENT:     Head: Normocephalic and atraumatic.     Right Ear: External ear normal.     Left Ear: External ear normal.     Nose: No congestion or rhinorrhea.     Mouth/Throat:     Mouth: Mucous membranes are moist.  Pharynx: Oropharynx is clear.  Eyes:     Extraocular Movements: Extraocular movements intact.     Conjunctiva/sclera: Conjunctivae normal.     Pupils: Pupils are equal, round, and reactive to light.  Cardiovascular:     Rate and Rhythm: Normal rate and regular rhythm.     Heart sounds: No murmur heard.    No friction rub. No gallop.  Pulmonary:     Effort: Pulmonary effort is normal.     Breath sounds: Normal breath sounds.  Abdominal:     General: Bowel sounds are normal.     Comments: Tender, some distension  Musculoskeletal:        General: No swelling. Normal range of motion.     Cervical back: Normal range of motion and neck supple.  Skin:     General: Skin is warm and dry.  Neurological:     General: No focal deficit present.     Mental Status: He is oriented to person, place, and time.  Psychiatric:        Mood and Affect: Mood normal.       Lab Results Lab Results  Component Value Date   WBC 6.2 11/24/2021   HGB 8.3 (L) 11/24/2021   HCT 25.2 (L) 11/24/2021   MCV 93.0 11/24/2021   PLT 241 11/24/2021    Lab Results  Component Value Date   CREATININE 0.73 11/24/2021   BUN 5 (L) 11/24/2021   NA 136 11/24/2021   K 3.1 (L) 11/24/2021   CL 105 11/24/2021   CO2 23 11/24/2021    Lab Results  Component Value Date   ALT 11 11/24/2021   AST 11 (L) 11/24/2021   ALKPHOS 49 11/24/2021   BILITOT 0.7 11/24/2021        Laurice Record, [facility] 11/24/2021, 11:34 AM

## 2021-11-24 NOTE — Progress Notes (Signed)
CSW received consult for substance use. CSW spoke with patient. CSW offered patient outpatient substance use treatment services resources. Patient declined. All questions answered. No further questions reported at this time.

## 2021-11-24 NOTE — Procedures (Signed)
Patient presented to IR for possible paracentesis. Limited ultrasound examination of the abdomen revealed insufficient peritoneal fluid for percutaneous intervention. No procedure performed. Images saved in Epic. Dr. Laurence Ferrari aware. [name] notified via [facility].   John [name],  [phone] 11/24/2021, 11:00 AM
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
## 2021-11-25 DIAGNOSIS — S36899A Unspecified injury of other intra-abdominal organs, initial encounter: Secondary | ICD-10-CM

## 2021-11-25 DIAGNOSIS — R197 Diarrhea, unspecified: Secondary | ICD-10-CM | POA: Diagnosis not present

## 2021-11-25 DIAGNOSIS — R161 Splenomegaly, not elsewhere classified: Secondary | ICD-10-CM

## 2021-11-25 LAB — MAGNESIUM: Magnesium: 2 mg/dL (ref 1.7–2.4)

## 2021-11-25 LAB — BASIC METABOLIC PANEL
Anion gap: 8 (ref 5–15)
BUN: <5 mg/dL — ABNORMAL LOW (ref 6–20)
CO2: 26 mmol/L (ref 22–32)
Calcium: 7.9 mg/dL — ABNORMAL LOW (ref 8.9–10.3)
Chloride: 100 mmol/L (ref 98–111)
Creatinine, Ser: 0.71 mg/dL (ref 0.61–1.24)
GFR, Estimated: >60 mL/min (ref >60)
Glucose, Bld: 106 mg/dL — ABNORMAL HIGH (ref 70–99)
Potassium: 3.4 mmol/L — ABNORMAL LOW (ref 3.5–5.1)
Sodium: 134 mmol/L — ABNORMAL LOW (ref 135–145)

## 2021-11-25 LAB — CBC
HCT: 24.4 % — ABNORMAL LOW (ref 39.0–52.0)
Hemoglobin: 8.3 g/dL — ABNORMAL LOW (ref 13.0–17.0)
MCH: 31.3 pg (ref 26.0–34.0)
MCHC: 34 g/dL (ref 30.0–36.0)
MCV: 92.1 fL (ref 80.0–100.0)
Platelets: 275 10*3/uL (ref 150–400)
RBC: 2.65 MIL/uL — ABNORMAL LOW (ref 4.22–5.81)
RDW: 12.8 % (ref 11.5–15.5)
WBC: 6.2 10*3/uL (ref 4.0–10.5)
nRBC: 0 % (ref 0.0–0.2)

## 2021-11-25 LAB — PHOSPHORUS: Phosphorus: 3.1 mg/dL (ref 2.5–4.6)
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
## 2021-11-26 ENCOUNTER — Other Ambulatory Visit (HOSPITAL_COMMUNITY): Payer: Self-pay

## 2021-11-26 DIAGNOSIS — R197 Diarrhea, unspecified: Secondary | ICD-10-CM | POA: Diagnosis not present

## 2021-11-26 LAB — BASIC METABOLIC PANEL
Anion gap: 6 (ref 5–15)
BUN: 14 mg/dL (ref 6–20)
CO2: 25 mmol/L (ref 22–32)
Calcium: 8.1 mg/dL — ABNORMAL LOW (ref 8.9–10.3)
Chloride: 104 mmol/L (ref 98–111)
Creatinine, Ser: 0.84 mg/dL (ref 0.61–1.24)
GFR, Estimated: >60 mL/min (ref >60)
Glucose, Bld: 127 mg/dL — ABNORMAL HIGH (ref 70–99)
Potassium: 3.7 mmol/L (ref 3.5–5.1)
Sodium: 135 mmol/L (ref 135–145)

## 2021-11-26 LAB — CBC
HCT: 26.9 % — ABNORMAL LOW (ref 39.0–52.0)
Hemoglobin: 9 g/dL — ABNORMAL LOW (ref 13.0–17.0)
MCH: 31.1 pg (ref 26.0–34.0)
MCHC: 33.5 g/dL (ref 30.0–36.0)
MCV: 93.1 fL (ref 80.0–100.0)
Platelets: 312 10*3/uL (ref 150–400)
RBC: 2.89 MIL/uL — ABNORMAL LOW (ref 4.22–5.81)
RDW: 12.6 % (ref 11.5–15.5)
WBC: 7.1 10*3/uL (ref 4.0–10.5)
nRBC: 0 % (ref 0.0–0.2)

## 2021-11-26 MED ORDER — ACETAMINOPHEN 325 MG PO TABS
650.0000 mg | ORAL_TABLET | Freq: Four times a day (QID) | ORAL | 0 refills | Status: DC | PRN
  Filled 2021-11-26: qty 30, 4d supply, fill #0

## 2021-11-26 MED ORDER — FOLIC ACID 1 MG PO TABS
1.0000 mg | ORAL_TABLET | Freq: Every day | ORAL | 0 refills | Status: DC
  Filled 2021-11-26: qty 30, 30d supply, fill #0

## 2021-11-26 MED ORDER — CYANOCOBALAMIN 1000 MCG PO TABS
1000.0000 ug | ORAL_TABLET | Freq: Every day | ORAL | 0 refills | Status: DC
  Filled 2021-11-26: qty 30, 30d supply, fill #0

## 2021-11-26 MED ORDER — ALBUTEROL SULFATE HFA 108 (90 BASE) MCG/ACT IN AERS: 2.0000 | INHALATION_SPRAY | RESPIRATORY_TRACT | 2 refills | Status: DC | PRN

## 2021-11-26 MED ORDER — ADULT MULTIVITAMIN W/MINERALS CH
1.0000 | ORAL_TABLET | Freq: Every day | ORAL | 0 refills | Status: DC
  Filled 2021-11-26: qty 30, 30d supply, fill #0

## 2021-11-26 MED ORDER — ALBUTEROL SULFATE HFA 108 (90 BASE) MCG/ACT IN AERS
2.0000 | INHALATION_SPRAY | RESPIRATORY_TRACT | 1 refills | Status: DC | PRN
  Filled 2021-11-26: qty 18, 30d supply, fill #0

## 2021-11-26 MED ORDER — THIAMINE HCL 100 MG PO TABS
100.0000 mg | ORAL_TABLET | Freq: Every day | ORAL | 0 refills | Status: DC
  Filled 2021-11-26: qty 30, 30d supply, fill #0

## 2021-11-26 MED ORDER — BUSPIRONE HCL 5 MG PO TABS
5.0000 mg | ORAL_TABLET | Freq: Two times a day (BID) | ORAL | 1 refills | Status: DC
  Filled 2021-11-26: qty 60, 30d supply, fill #0

## 2021-11-26 MED ORDER — QUETIAPINE FUMARATE 100 MG PO TABS
100.0000 mg | ORAL_TABLET | Freq: Every evening | ORAL | 1 refills | Status: DC | PRN
  Filled 2021-11-26: qty 30, 30d supply, fill #0

## 2021-11-26 MED ORDER — BIKTARVY 50-200-25 MG PO TABS
1.0000 | ORAL_TABLET | Freq: Every day | ORAL | 3 refills | Status: DC
  Filled 2021-11-26: qty 30, 30d supply, fill #0

## 2021-11-26 MED ORDER — QUETIAPINE FUMARATE 100 MG PO TABS
100.0000 mg | ORAL_TABLET | Freq: Every evening | ORAL | 1 refills | Status: DC | PRN
  Filled 2021-11-26: qty 7, 7d supply, fill #0

## 2021-11-26 MED ORDER — BIKTARVY 50-200-25 MG PO TABS
1.0000 | ORAL_TABLET | Freq: Every day | ORAL | 0 refills | Status: DC
  Filled 2021-11-26: qty 30, 30d supply, fill #0

## 2021-11-26 NOTE — Progress Notes (Signed)
Brief ID Note:   Plan will be to dispense 30-d supply of Biktarvy through TOC. Working on arranging this now.   Appt scheduled with regular ID provider 10/10 @ 2:30 pm.   Plan will be to D/C to [facility] services for homeless resources / assistance.   Has New patient appt with [place] on 10/9.    Janene Madeira, MSN, NP-C [facility] for Infectious Disease [email or web address] Pager: [phone] Office: [phone] [facility] Main Line: [place] Communication Welcome
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
## 2021-11-26 NOTE — Progress Notes (Signed)
John [name] to be D/C'd per MD order. Discussed with the patient and all questions fully answered.  VSS, Skin clean, dry, and intact without evidence of skin break down, no evidence of skin tears noted.  IV catheter discontinued intact. Site without signs and symptoms of complications. Dressing and pressure applied.  An After visit Summary was printed and given to the patient. Taxi and bus voucher given to the patient.  D/C Re education completed with patient/family including follow up  instructions, medication list, d/c activities limitations if indicated with other d/c instructions as indicated by MD- patient able to verbalize understanding all questions fully answered.  Pt instructed to return to ED and call 911 or call MD for any changes in condition.  Patient to be escorted via [facility] and D/C home via private auto.

## 2021-11-27 LAB — METHYLMALONIC ACID, SERUM: Methylmalonic Acid, Quantitative: 246 nmol/L (ref 0–378)

## 2021-11-29 LAB — CULTURE, BLOOD (ROUTINE X 2)
Culture: NO GROWTH
Culture: NO GROWTH
Special Requests: ADEQUATE

## 2021-12-01 ENCOUNTER — Other Ambulatory Visit: Payer: Self-pay

## 2021-12-01 ENCOUNTER — Emergency Department (HOSPITAL_COMMUNITY): Payer: Medicaid Other

## 2021-12-01 ENCOUNTER — Emergency Department (HOSPITAL_COMMUNITY)
Admission: EM | Admit: 2021-12-01 | Discharge: 2021-12-01 | Disposition: A | Discharge status: Home / Self Care | Payer: Medicaid Other | Attending: Emergency Medicine | Admitting: Emergency Medicine

## 2021-12-01 DIAGNOSIS — I10 Essential (primary) hypertension: Secondary | ICD-10-CM | POA: Insufficient documentation

## 2021-12-01 DIAGNOSIS — R1012 Left upper quadrant pain: Secondary | ICD-10-CM | POA: Insufficient documentation

## 2021-12-01 DIAGNOSIS — Z21 Asymptomatic human immunodeficiency virus [HIV] infection status: Secondary | ICD-10-CM | POA: Insufficient documentation

## 2021-12-01 LAB — CBC WITH DIFFERENTIAL/PLATELET
Abs Immature Granulocytes: 0.03 10*3/uL (ref 0.00–0.07)
Basophils Absolute: 0 10*3/uL (ref 0.0–0.1)
Basophils Relative: 1 %
Eosinophils Absolute: 0.2 10*3/uL (ref 0.0–0.5)
Eosinophils Relative: 2 %
HCT: 30.6 % — ABNORMAL LOW (ref 39.0–52.0)
Hemoglobin: 10.1 g/dL — ABNORMAL LOW (ref 13.0–17.0)
Immature Granulocytes: 0 %
Lymphocytes Relative: 25 %
Lymphs Abs: 1.8 10*3/uL (ref 0.7–4.0)
MCH: 31.6 pg (ref 26.0–34.0)
MCHC: 33 g/dL (ref 30.0–36.0)
MCV: 95.6 fL (ref 80.0–100.0)
Monocytes Absolute: 0.8 10*3/uL (ref 0.1–1.0)
Monocytes Relative: 11 %
Neutro Abs: 4.3 10*3/uL (ref 1.7–7.7)
Neutrophils Relative %: 61 %
Platelets: 628 10*3/uL — ABNORMAL HIGH (ref 150–400)
RBC: 3.2 MIL/uL — ABNORMAL LOW (ref 4.22–5.81)
RDW: 13.2 % (ref 11.5–15.5)
WBC: 7.1 10*3/uL (ref 4.0–10.5)
nRBC: 0 % (ref 0.0–0.2)

## 2021-12-01 LAB — COMPREHENSIVE METABOLIC PANEL
ALT: 10 U/L (ref 0–44)
AST: 15 U/L (ref 15–41)
Albumin: 2.9 g/dL — ABNORMAL LOW (ref 3.5–5.0)
Alkaline Phosphatase: 61 U/L (ref 38–126)
Anion gap: 9 (ref 5–15)
BUN: 11 mg/dL (ref 6–20)
CO2: 26 mmol/L (ref 22–32)
Calcium: 8.7 mg/dL — ABNORMAL LOW (ref 8.9–10.3)
Chloride: 104 mmol/L (ref 98–111)
Creatinine, Ser: 0.68 mg/dL (ref 0.61–1.24)
GFR, Estimated: >60 mL/min (ref >60)
Glucose, Bld: 78 mg/dL (ref 70–99)
Potassium: 3.3 mmol/L — ABNORMAL LOW (ref 3.5–5.1)
Sodium: 139 mmol/L (ref 135–145)
Total Bilirubin: 0.4 mg/dL (ref 0.3–1.2)
Total Protein: 6.6 g/dL (ref 6.5–8.1)

## 2021-12-01 LAB — CULTURE, BLOOD (ROUTINE X 2)
Culture: NO GROWTH
Culture: NO GROWTH

## 2021-12-01 LAB — LIPASE, BLOOD: Lipase: 29 U/L (ref 11–51)

## 2021-12-01 MED ORDER — IOHEXOL 350 MG/ML SOLN
70.0000 mL | Freq: Once | INTRAVENOUS | Status: AC | PRN
  Administered 2021-12-01: 70 mL via INTRAVENOUS

## 2021-12-01 MED ORDER — ONDANSETRON HCL 4 MG/2ML IJ SOLN
4.0000 mg | Freq: Once | INTRAMUSCULAR | Status: AC
  Administered 2021-12-01: 4 mg via INTRAVENOUS
  Filled 2021-12-01: qty 2

## 2021-12-01 MED ORDER — MORPHINE SULFATE (PF) 4 MG/ML IV SOLN
4.0000 mg | Freq: Once | INTRAVENOUS | Status: AC
  Administered 2021-12-01: 4 mg via INTRAVENOUS
  Filled 2021-12-01: qty 1
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
## 2021-12-01 NOTE — ED Notes (Signed)
Pt being extremely rude to staff at this time. Yelling and cussing and wanting to leave. Stating that this Probation officer has a "nasty attitude". Pt was informed to be respectful to staff and to be patient. Patient was given a [place] sandwich and water.
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
## 2021-12-01 NOTE — ED Notes (Signed)
Patient verbalizes understanding of discharge instructions. Opportunity for questioning and answers were provided. Pt refusing vitals at discharge. Pt ambulatory to ED waiting room with steady gait.
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
## 2021-12-21 ENCOUNTER — Inpatient Hospital Stay: Payer: Medicaid Other | Admitting: Family Medicine
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
## 2021-12-22 ENCOUNTER — Inpatient Hospital Stay: Payer: Medicaid Other | Admitting: Family

## 2021-12-22 ENCOUNTER — Telehealth: Payer: Self-pay

## 2021-12-22 NOTE — Telephone Encounter (Signed)
Attempted to call patient regarding missed appointment. Not able to reach him at this time. Left voicemail requesting call back.John [name], RMA

## 2022-01-04 ENCOUNTER — Encounter: Payer: Medicaid Other | Admitting: Student

## 2022-01-27 ENCOUNTER — Emergency Department (HOSPITAL_COMMUNITY): Payer: Medicaid Other

## 2022-01-27 ENCOUNTER — Other Ambulatory Visit: Payer: Self-pay

## 2022-01-27 ENCOUNTER — Emergency Department (HOSPITAL_COMMUNITY)
Admission: EM | Admit: 2022-01-27 | Discharge: 2022-01-27 | Disposition: A | Discharge status: Home / Self Care | Payer: Medicaid Other | Attending: Emergency Medicine | Admitting: Emergency Medicine

## 2022-01-27 DIAGNOSIS — F17210 Nicotine dependence, cigarettes, uncomplicated: Secondary | ICD-10-CM | POA: Insufficient documentation

## 2022-01-27 DIAGNOSIS — J45901 Unspecified asthma with (acute) exacerbation: Secondary | ICD-10-CM | POA: Insufficient documentation

## 2022-01-27 DIAGNOSIS — Z1152 Encounter for screening for COVID-19: Secondary | ICD-10-CM | POA: Insufficient documentation

## 2022-01-27 DIAGNOSIS — R0603 Acute respiratory distress: Secondary | ICD-10-CM | POA: Diagnosis present

## 2022-01-27 LAB — BASIC METABOLIC PANEL
Anion gap: 14 (ref 5–15)
BUN: 8 mg/dL (ref 6–20)
CO2: 24 mmol/L (ref 22–32)
Calcium: 9.1 mg/dL (ref 8.9–10.3)
Chloride: 101 mmol/L (ref 98–111)
Creatinine, Ser: 0.75 mg/dL (ref 0.61–1.24)
GFR, Estimated: >60 mL/min (ref >60)
Glucose, Bld: 91 mg/dL (ref 70–99)
Potassium: 4.5 mmol/L (ref 3.5–5.1)
Sodium: 139 mmol/L (ref 135–145)

## 2022-01-27 LAB — RESP PANEL BY RT-PCR (FLU A&B, COVID) ARPGX2
Influenza A by PCR: NEGATIVE
Influenza B by PCR: NEGATIVE
SARS Coronavirus 2 by RT PCR: NEGATIVE

## 2022-01-27 LAB — CBC WITH DIFFERENTIAL/PLATELET
Abs Immature Granulocytes: 0.01 10*3/uL (ref 0.00–0.07)
Basophils Absolute: 0 10*3/uL (ref 0.0–0.1)
Basophils Relative: 1 %
Eosinophils Absolute: 0.4 10*3/uL (ref 0.0–0.5)
Eosinophils Relative: 5 %
HCT: 44.6 % (ref 39.0–52.0)
Hemoglobin: 14 g/dL (ref 13.0–17.0)
Immature Granulocytes: 0 %
Lymphocytes Relative: 39 %
Lymphs Abs: 2.7 10*3/uL (ref 0.7–4.0)
MCH: 30.1 pg (ref 26.0–34.0)
MCHC: 31.4 g/dL (ref 30.0–36.0)
MCV: 95.9 fL (ref 80.0–100.0)
Monocytes Absolute: 0.8 10*3/uL (ref 0.1–1.0)
Monocytes Relative: 11 %
Neutro Abs: 3.2 10*3/uL (ref 1.7–7.7)
Neutrophils Relative %: 44 %
Platelets: 332 10*3/uL (ref 150–400)
RBC: 4.65 MIL/uL (ref 4.22–5.81)
RDW: 12.6 % (ref 11.5–15.5)
WBC: 7.1 10*3/uL (ref 4.0–10.5)
nRBC: 0 % (ref 0.0–0.2)

## 2022-01-27 MED ORDER — SODIUM CHLORIDE 0.9 % IV SOLN: INTRAVENOUS | Status: DC

## 2022-01-27 MED ORDER — METHYLPREDNISOLONE SODIUM SUCC 125 MG IJ SOLR
125.0000 mg | Freq: Once | INTRAMUSCULAR | Status: AC
  Administered 2022-01-27: 125 mg via INTRAVENOUS
  Filled 2022-01-27: qty 2

## 2022-01-27 MED ORDER — MAGNESIUM SULFATE 2 GM/50ML IV SOLN
2.0000 g | Freq: Once | INTRAVENOUS | Status: AC
  Administered 2022-01-27: 2 g via INTRAVENOUS
  Filled 2022-01-27: qty 50

## 2022-01-27 MED ORDER — ALBUTEROL SULFATE (2.5 MG/3ML) 0.083% IN NEBU
INHALATION_SOLUTION | RESPIRATORY_TRACT | Status: AC
  Administered 2022-01-27: 7.5 mg
  Filled 2022-01-27: qty 9

## 2022-01-27 MED ORDER — PREDNISONE 20 MG PO TABS: 60.0000 mg | ORAL_TABLET | Freq: Every day | ORAL | 0 refills | Status: DC

## 2022-01-27 MED ORDER — ALBUTEROL (5 MG/ML) CONTINUOUS INHALATION SOLN
7.5000 mg/h | INHALATION_SOLUTION | Freq: Once | RESPIRATORY_TRACT | Status: AC
  Administered 2022-01-27: 7.5 mg/h via RESPIRATORY_TRACT
  Filled 2022-01-27: qty 20

## 2022-01-27 NOTE — ED Provider Notes (Signed)
[facility] EMERGENCY DEPARTMENT Provider Note   CSN: [id number] Arrival date & time: 01/27/22  4098     History  Chief Complaint  Patient presents with   Respiratory Distress    John [name] is a 51 y.o. male.  He is brought in by EMS for "asthma flare".  He said he has been short of breath with wheezing and cough since yesterday.  Cough productive of yellow sputum.  He denies any fevers chest pain nausea vomiting.  He does endorse smoking.  He received DuoNebs and albuterol with some improvement in his symptoms.  The history is provided by the patient and the EMS personnel.  Shortness of Breath Severity:  Severe Onset quality:  Gradual Duration:  2 days Timing:  Constant Progression:  Worsening Chronicity:  Recurrent Relieved by:  Nothing Worsened by:  Activity and coughing Ineffective treatments:  Inhaler Associated symptoms: cough, sputum production and wheezing   Associated symptoms: no abdominal pain, no chest pain, no fever, no hemoptysis, no rash, no sore throat and no vomiting   Risk factors: tobacco use        Home Medications Prior to Admission medications   Medication Sig Start Date End Date Taking? Authorizing Provider  acetaminophen (TYLENOL) 325 MG tablet Take 2 tablets (650 mg total) by mouth every 6 (six) hours as needed for mild pain (or Fever >/= 101). 11/26/21   Little Ishikawa, MD  albuterol (VENTOLIN HFA) 108 (90 Base) MCG/ACT inhaler Inhale 2 puffs into the lungs every 4 (four) hours as needed for wheezing or shortness of breath. 11/26/21   Little Ishikawa, MD  bictegravir-emtricitabine-tenofovir AF (BIKTARVY) 50-200-25 MG TABS tablet Take 1 tablet by mouth daily. 11/26/21   Leroy Callas, NP  busPIRone (BUSPAR) 5 MG tablet Take 1 tablet (5 mg total) by mouth 2 (two) times daily. 11/26/21   Little Ishikawa, MD  cyanocobalamin 1000 MCG tablet Take 1 tablet (1,000 mcg total) by mouth daily. 11/26/21   Little Ishikawa, MD  folic acid (FOLVITE) 1 MG tablet Take 1 tablet (1 mg total) by mouth daily. 11/26/21   Little Ishikawa, MD  Multiple Vitamin (MULTIVITAMIN WITH MINERALS) TABS tablet Take 1 tablet by mouth daily. 11/26/21   Little Ishikawa, MD  QUEtiapine (SEROQUEL) 100 MG tablet Take 1 tablet (100 mg total) by mouth at bedtime as needed (For sleep). 11/26/21   Little Ishikawa, MD  thiamine (VITAMIN B1) 100 MG tablet Take 1 tablet (100 mg total) by mouth daily. 11/26/21   Little Ishikawa, MD  dicyclomine (BENTYL) 20 MG tablet Take 1 tablet (20 mg total) by mouth 2 (two) times daily. 01/08/17 01/03/20  Palumbo, April, MD  sucralfate (CARAFATE) 1 GM/10ML suspension Take 10 mLs (1 g total) by mouth 4 (four) times daily -  with meals and at bedtime. Patient not taking: Reported on 10/21/2017 01/08/17 09/03/18  Randal Buba, April, MD      Allergies    Shellfish allergy    Review of Systems   Review of Systems  Constitutional:  Negative for fever.  HENT:  Negative for sore throat.   Eyes:  Negative for visual disturbance.  Respiratory:  Positive for cough, sputum production, shortness of breath and wheezing. Negative for hemoptysis.   Cardiovascular:  Negative for chest pain.  Gastrointestinal:  Negative for abdominal pain and vomiting.  Genitourinary:  Negative for dysuria.  Skin:  Negative for rash.    Physical Exam Updated Vital Signs BP Marland Kitchen)  163/104 (BP Location: Left Arm)   Pulse 87   Temp 98 F (36.7 C) (Oral)   Resp (!) 25   Ht '5\' 5"'$  (1.651 m)   SpO2 96%   BMI 23.55 kg/m  Physical Exam Vitals and nursing note reviewed.  Constitutional:      General: He is not in acute distress.    Appearance: Normal appearance. He is well-developed.  HENT:     Head: Normocephalic and atraumatic.  Eyes:     Conjunctiva/sclera: Conjunctivae normal.  Cardiovascular:     Rate and Rhythm: Normal rate and regular rhythm.     Heart sounds: No murmur heard. Pulmonary:     Effort:  Tachypnea and accessory muscle usage present. No respiratory distress.     Breath sounds: Wheezing present.  Abdominal:     Palpations: Abdomen is soft.     Tenderness: There is no abdominal tenderness. There is no guarding or rebound.  Musculoskeletal:        General: Normal range of motion.     Cervical back: Neck supple.     Right lower leg: No edema.     Left lower leg: No edema.  Skin:    General: Skin is warm and dry.     Capillary Refill: Capillary refill takes less than 2 seconds.  Neurological:     General: No focal deficit present.     Mental Status: He is alert.     ED Results / Procedures / Treatments   Labs (all labs ordered are listed, but only abnormal results are displayed) Labs Reviewed  RESP PANEL BY RT-PCR (FLU A&B, COVID) [id number]  BASIC METABOLIC PANEL  CBC WITH DIFFERENTIAL/PLATELET    EKG EKG Interpretation  Date/Time:  Wednesday January 27 2022 07:10:09 EST Ventricular Rate:  98 PR Interval:  129 QRS Duration: 98 QT Interval:  347 QTC Calculation: 443 R Axis:   50 Text Interpretation: Sinus rhythm Ventricular premature complex Aberrant conduction of SV complex(es) Low voltage, extremity leads Probable anteroseptal infarct, old Minimal ST elevation, inferior leads Poor data quality in current ECG precludes serial comparison Confirmed by Aletta Edouard [phone]) on 01/27/2022 7:23:48 AM  Radiology DG Chest Port 1 View  Result Date: 01/27/2022 CLINICAL DATA:  Shortness of breath EXAM: PORTABLE CHEST 1 VIEW COMPARISON:  CT chest abdomen and pelvis 11/22/2021 FINDINGS: No pleural effusion. No pneumothorax. Normal cardiac and mediastinal contours. Hazy opacity at the left lung base is new compared to 11/22/2021 and likely artifactual. No displaced rib fractures. Visualized upper abdomen is unremarkable. IMPRESSION: No focal airspace opacity. Electronically Signed   By: Marin Roberts M.D.   On: 01/27/2022 07:53    Procedures Procedures    Medications  Ordered in ED Medications  albuterol (PROVENTIL,VENTOLIN) solution continuous neb (7.5 mg/hr Nebulization Given 01/27/22 0750)  magnesium sulfate IVPB 2 g 50 mL (0 g Intravenous Stopped 01/27/22 0731)  methylPREDNISolone sodium succinate (SOLU-MEDROL) 125 mg/2 mL injection 125 mg (125 mg Intravenous Given 01/27/22 0727)  albuterol (PROVENTIL) (2.5 MG/3ML) 0.083% nebulizer solution (7.5 mg  Given 01/27/22 0750)    ED Course/ Medical Decision Making/ A&P Clinical Course as of 01/27/22 1702  Wed Jan 27, 2022  0757 Chest x-ray interpreted by me as no acute infiltrates no pneumothorax.  Awaiting radiology reading. [MB]  0539 Patient resting comfortably on continuous neb. [MB]  [id number] Patient sounds much more clear.  He has eaten some food.  Satting 90% off oxygen.  Sleeping. [MB]    Clinical Course User Index [MB]  Hayden Rasmussen, MD                           Medical Decision Making Amount and/or Complexity of Data Reviewed Labs: ordered. Radiology: ordered.  Risk Prescription drug management.   This patient complains of shortness of breath chest tightness cough asthma; this involves an extensive number of treatment Options and is a complaint that carries with it a high risk of complications and morbidity. The differential includes asthma exacerbation, COPD, hypoxia, pneumothorax, PE  I ordered, reviewed and interpreted labs, which included CBC with normal white count normal hemoglobin, chemistries normal, COVID and flu negative I ordered medication IV magnesium inhalational treatments and reviewed PMP when indicated. I ordered imaging studies which included chest x-ray and I independently    visualized and interpreted imaging which showed no acute findings Additional history obtained from EMS Previous records obtained and reviewed in epic multiple ED visits for similar presentations  Cardiac monitoring reviewed, normal sinus rhythm Social determinants considered, significant  barriers including financial food insecurity physical activity transportation smoking Critical Interventions: Continuous nebulization treatments and multiple reevaluations for respiratory distress  After the interventions stated above, I reevaluated the patient and found patient's breathing to be improved and satting well on room air Admission and further testing considered, no indications for admission or further work-up at this time.  Patient is comfortable managing rest of his symptoms at home.  Return instructions discussed.  Counseled to stop smoking         Final Clinical Impression(s) / ED Diagnoses Final diagnoses:  Exacerbation of asthma, unspecified asthma severity, unspecified whether persistent    Rx / DC Orders ED Discharge Orders          Ordered    predniSONE (DELTASONE) 20 MG tablet  Daily        01/27/22 0959              Hayden Rasmussen, MD 01/27/22 1704

## 2022-01-27 NOTE — ED Notes (Signed)
Called for RT to start breathing treatment, with another patient and will come down as soon as available

## 2022-01-27 NOTE — Discharge Instructions (Addendum)
You are seen in the emergency department for shortness of breath.  Your chest x-ray did not show pneumonia and your COVID and flu test were negative.  You were given breathing treatments and steroids with improvement in your breathing.  We are prescribing you 4 more days of prednisone.  Continue your inhaler as needed.  Will be important for you to stop smoking as this likely is worsening your asthma.  Return to the emergency department if any worsening or concerning symptoms

## 2022-01-27 NOTE — ED Triage Notes (Signed)
BIB EMS for SOB x 1 day  and a cough with yellow sputum. Upon arrival patient speaking two short sentence, respiratory rate up top 40s, and diaphoretic. Received breathing tx en route. HX of HIV not on medications.

## 2022-01-28 ENCOUNTER — Encounter (HOSPITAL_COMMUNITY): Payer: Self-pay | Admitting: Emergency Medicine

## 2022-01-28 ENCOUNTER — Emergency Department (HOSPITAL_COMMUNITY)
Admission: EM | Admit: 2022-01-28 | Discharge: 2022-01-28 | Disposition: A | Discharge status: Home / Self Care | Payer: Medicaid Other | Attending: Emergency Medicine | Admitting: Emergency Medicine

## 2022-01-28 ENCOUNTER — Emergency Department (HOSPITAL_COMMUNITY): Payer: Medicaid Other

## 2022-01-28 ENCOUNTER — Other Ambulatory Visit: Payer: Self-pay

## 2022-01-28 DIAGNOSIS — R000 Tachycardia, unspecified: Secondary | ICD-10-CM | POA: Diagnosis not present

## 2022-01-28 DIAGNOSIS — J449 Chronic obstructive pulmonary disease, unspecified: Secondary | ICD-10-CM | POA: Insufficient documentation

## 2022-01-28 DIAGNOSIS — J4541 Moderate persistent asthma with (acute) exacerbation: Secondary | ICD-10-CM | POA: Insufficient documentation

## 2022-01-28 DIAGNOSIS — Z7952 Long term (current) use of systemic steroids: Secondary | ICD-10-CM | POA: Insufficient documentation

## 2022-01-28 DIAGNOSIS — Z7951 Long term (current) use of inhaled steroids: Secondary | ICD-10-CM | POA: Diagnosis not present

## 2022-01-28 DIAGNOSIS — F17210 Nicotine dependence, cigarettes, uncomplicated: Secondary | ICD-10-CM | POA: Insufficient documentation

## 2022-01-28 DIAGNOSIS — R059 Cough, unspecified: Secondary | ICD-10-CM | POA: Diagnosis present

## 2022-01-28 LAB — CBC
HCT: 42 % (ref 39.0–52.0)
Hemoglobin: 14.1 g/dL (ref 13.0–17.0)
MCH: 30.7 pg (ref 26.0–34.0)
MCHC: 33.6 g/dL (ref 30.0–36.0)
MCV: 91.5 fL (ref 80.0–100.0)
Platelets: 358 10*3/uL (ref 150–400)
RBC: 4.59 MIL/uL (ref 4.22–5.81)
RDW: 12.8 % (ref 11.5–15.5)
WBC: 6.5 10*3/uL (ref 4.0–10.5)
nRBC: 0 % (ref 0.0–0.2)

## 2022-01-28 LAB — BASIC METABOLIC PANEL
Anion gap: 11 (ref 5–15)
BUN: 16 mg/dL (ref 6–20)
CO2: 26 mmol/L (ref 22–32)
Calcium: 9.2 mg/dL (ref 8.9–10.3)
Chloride: 102 mmol/L (ref 98–111)
Creatinine, Ser: 0.96 mg/dL (ref 0.61–1.24)
GFR, Estimated: >60 mL/min (ref >60)
Glucose, Bld: 115 mg/dL — ABNORMAL HIGH (ref 70–99)
Potassium: 4.4 mmol/L (ref 3.5–5.1)
Sodium: 139 mmol/L (ref 135–145)

## 2022-01-28 LAB — D-DIMER, QUANTITATIVE: D-Dimer, Quant: <0.27 ug/mL-FEU (ref 0.00–0.50)

## 2022-01-28 MED ORDER — PREDNISONE 20 MG PO TABS: ORAL_TABLET | ORAL | 0 refills | Status: DC

## 2022-01-28 MED ORDER — ALBUTEROL SULFATE HFA 108 (90 BASE) MCG/ACT IN AERS
4.0000 | INHALATION_SPRAY | RESPIRATORY_TRACT | Status: AC
  Administered 2022-01-28 (×3): 4 via RESPIRATORY_TRACT
  Filled 2022-01-28: qty 6.7

## 2022-01-28 MED ORDER — ALBUTEROL SULFATE (2.5 MG/3ML) 0.083% IN NEBU
5.0000 mg | INHALATION_SOLUTION | Freq: Once | RESPIRATORY_TRACT | Status: AC
  Administered 2022-01-28: 5 mg via RESPIRATORY_TRACT
  Filled 2022-01-28: qty 6

## 2022-01-28 MED ORDER — DEXAMETHASONE SODIUM PHOSPHATE 10 MG/ML IJ SOLN
10.0000 mg | Freq: Once | INTRAMUSCULAR | Status: AC
  Administered 2022-01-28: 10 mg via INTRAVENOUS
  Filled 2022-01-28: qty 1

## 2022-01-28 MED ORDER — ALBUTEROL SULFATE HFA 108 (90 BASE) MCG/ACT IN AERS: 2.0000 | INHALATION_SPRAY | RESPIRATORY_TRACT | Status: DC | PRN

## 2022-01-28 NOTE — ED Notes (Signed)
Patient ambulates in hall way, Spo2-99% at rest and 95% while ambulating, denies shortness of breath.

## 2022-01-28 NOTE — Discharge Instructions (Signed)
Follow-up with your primary doctor or the wellness clinic for reassessment.  Take steroids as directed and use inhaler every 3-4 hours as needed for wheezing shortness of breath.  For worsening breathing difficulty, passing out, chest pain return to the emergency room.

## 2022-01-28 NOTE — ED Notes (Signed)
Patient given bus pass per his request.

## 2022-01-28 NOTE — ED Triage Notes (Signed)
Per [facility] pt coming from a friends house c/o shortness of breath. Given neb treatment by fire and another neb treatment with ems. Patient speaking in full sentences and A&0x4.

## 2022-01-28 NOTE — ED Provider Notes (Signed)
[facility] EMERGENCY DEPARTMENT Provider Note   CSN: [id number] Arrival date & time: 01/28/22  1257     History  Chief Complaint  Patient presents with   Asthma    John [name] is a 51 y.o. male.  Patient presents with worsening shortness of breath.  Patient has history of asthma/COPD and is out of inhaler.  Patient did cough congestion as well.  Worsening shortness of breath for the past 2 days.  This feels similar to his asthma history.  Patient denies any known blood clot history, recent surgeries or active cancer treatment.  No leg swelling.  Patient given neb treatment by fire department and other treatment with EMS.  Patient smokes cigarettes.  Patient denies any heart failure history of blood clot history.       Home Medications Prior to Admission medications   Medication Sig Start Date End Date Taking? Authorizing Provider  predniSONE (DELTASONE) 20 MG tablet 3 tabs po day one, then 2 po daily x 4 days 01/28/22  Yes Elnora Morrison, MD  acetaminophen (TYLENOL) 325 MG tablet Take 2 tablets (650 mg total) by mouth every 6 (six) hours as needed for mild pain (or Fever >/= 101). 11/26/21   Little Ishikawa, MD  albuterol (VENTOLIN HFA) 108 (90 Base) MCG/ACT inhaler Inhale 2 puffs into the lungs every 4 (four) hours as needed for wheezing or shortness of breath. 11/26/21   Little Ishikawa, MD  bictegravir-emtricitabine-tenofovir AF (BIKTARVY) 50-200-25 MG TABS tablet Take 1 tablet by mouth daily. 11/26/21   Alburnett Callas, NP  busPIRone (BUSPAR) 5 MG tablet Take 1 tablet (5 mg total) by mouth 2 (two) times daily. 11/26/21   Little Ishikawa, MD  cyanocobalamin 1000 MCG tablet Take 1 tablet (1,000 mcg total) by mouth daily. 11/26/21   Little Ishikawa, MD  folic acid (FOLVITE) 1 MG tablet Take 1 tablet (1 mg total) by mouth daily. 11/26/21   Little Ishikawa, MD  Multiple Vitamin (MULTIVITAMIN WITH MINERALS) TABS tablet Take 1 tablet by mouth  daily. 11/26/21   Little Ishikawa, MD  QUEtiapine (SEROQUEL) 100 MG tablet Take 1 tablet (100 mg total) by mouth at bedtime as needed (For sleep). 11/26/21   Little Ishikawa, MD  thiamine (VITAMIN B1) 100 MG tablet Take 1 tablet (100 mg total) by mouth daily. 11/26/21   Little Ishikawa, MD  dicyclomine (BENTYL) 20 MG tablet Take 1 tablet (20 mg total) by mouth 2 (two) times daily. 01/08/17 01/03/20  Palumbo, April, MD  sucralfate (CARAFATE) 1 GM/10ML suspension Take 10 mLs (1 g total) by mouth 4 (four) times daily -  with meals and at bedtime. Patient not taking: Reported on 10/21/2017 01/08/17 09/03/18  Palumbo, April, MD      Allergies    Shellfish allergy    Review of Systems   Review of Systems  Constitutional:  Negative for chills and fever.  HENT:  Positive for congestion.   Eyes:  Negative for visual disturbance.  Respiratory:  Positive for cough and shortness of breath.   Cardiovascular:  Negative for chest pain and leg swelling.  Gastrointestinal:  Negative for abdominal pain and vomiting.  Genitourinary:  Negative for dysuria and flank pain.  Musculoskeletal:  Negative for back pain, neck pain and neck stiffness.  Skin:  Negative for rash.  Neurological:  Negative for light-headedness and headaches.    Physical Exam Updated Vital Signs BP 128/81   Pulse 86   Temp 98.3 F (  36.8 C) (Oral)   Resp 18   Ht '5\' 5"'$  (1.651 m)   Wt 64 kg   SpO2 99%   BMI 23.48 kg/m  Physical Exam Vitals and nursing note reviewed.  Constitutional:      General: He is not in acute distress.    Appearance: He is well-developed.  HENT:     Head: Normocephalic and atraumatic.     Mouth/Throat:     Mouth: Mucous membranes are moist.  Eyes:     General:        Right eye: No discharge.        Left eye: No discharge.     Conjunctiva/sclera: Conjunctivae normal.  Neck:     Trachea: No tracheal deviation.  Cardiovascular:     Rate and Rhythm: Regular rhythm. Tachycardia present.   Pulmonary:     Effort: Pulmonary effort is normal.     Breath sounds: Wheezing and rales present.  Abdominal:     General: There is no distension.     Palpations: Abdomen is soft.     Tenderness: There is no abdominal tenderness. There is no guarding.  Musculoskeletal:     Cervical back: Normal range of motion and neck supple. No rigidity.  Skin:    General: Skin is warm.     Capillary Refill: Capillary refill takes less than 2 seconds.     Findings: No rash.  Neurological:     General: No focal deficit present.     Mental Status: He is alert.     Cranial Nerves: No cranial nerve deficit.  Psychiatric:        Mood and Affect: Mood normal.     ED Results / Procedures / Treatments   Labs (all labs ordered are listed, but only abnormal results are displayed) Labs Reviewed  BASIC METABOLIC PANEL - Abnormal; Notable for the following components:      Result Value   Glucose, Bld 115 (*)    All other components within normal limits  CBC  D-DIMER, QUANTITATIVE    EKG None  Radiology DG Chest 2 View  Result Date: 01/28/2022 CLINICAL DATA:  Shortness of breath and asthma. Productive cough for 1 day. EXAM: CHEST - 2 VIEW COMPARISON:  AP chest 01/27/2022 and 11/22/2021 FINDINGS: Cardiac silhouette and mediastinal contours are within normal limits. Mild calcification within the aortic arch. Flattening of the diaphragms and moderate hyperinflation. Increased lucencies within the bilateral upper lungs consistent with emphysematous changes. Minimal bilateral lower lung interstitial thickening is unchanged from 11/22/2021, likely mild scarring. The previously questioned hazy opacification at the left lung base February artifactual is not seen on the current study. No pneumonia is seen on frontal or lateral view. No pleural effusion or pneumothorax. Mild multilevel degenerative disc changes of the thoracic spine. IMPRESSION: 1. No acute cardiopulmonary disease. 2. Moderate hyperinflation of  the lungs consistent with COPD. Electronically Signed   By: Yvonne Kendall M.D.   On: 01/28/2022 15:07   DG Chest Port 1 View  Result Date: 01/27/2022 CLINICAL DATA:  Shortness of breath EXAM: PORTABLE CHEST 1 VIEW COMPARISON:  CT chest abdomen and pelvis 11/22/2021 FINDINGS: No pleural effusion. No pneumothorax. Normal cardiac and mediastinal contours. Hazy opacity at the left lung base is new compared to 11/22/2021 and likely artifactual. No displaced rib fractures. Visualized upper abdomen is unremarkable. IMPRESSION: No focal airspace opacity. Electronically Signed   By: Marin Roberts M.D.   On: 01/27/2022 07:53    Procedures Procedures  Medications Ordered in ED Medications  dexamethasone (DECADRON) injection 10 mg (10 mg Intravenous Given 01/28/22 1602)  albuterol (VENTOLIN HFA) 108 (90 Base) MCG/ACT inhaler 4 puff (4 puffs Inhalation Given 01/28/22 1713)  albuterol (PROVENTIL) (2.5 MG/3ML) 0.083% nebulizer solution 5 mg (5 mg Nebulization Given 01/28/22 1611)    ED Course/ Medical Decision Making/ A&P                           Medical Decision Making Amount and/or Complexity of Data Reviewed Labs: ordered.  Risk Prescription drug management.   Patient with known lung disease history presents with clinical concern for acute asthma/COPD exacerbation given recurrence, wheezing on exam and tachypnea.  Patient low risk for blood clot given second visit to the ED in 48 hours for worsening shortness of breath D-dimer added.  General blood work ordered.  Chest x-ray ordered and independently reviewed no infiltrate appreciated or obvious fluid.  Plan for albuterol treatments, steroids and reassessment.  Patient given 3 rounds of albuterol inhaler and 1 nebulizer in the ER.  Patient improved significantly, ambulated with normal saturations normal work of breathing.  Patient tolerated oral and solid foods.  Blood work independently reviewed normal white count, normal hemoglobin, D-dimer  negative patient low risk for blood clot no further CT scan required.  Plan to send home with albuterol inhaler as he said he does not have 1.  Steroid prescription as well.  Patient stable for discharge at this time.        Final Clinical Impression(s) / ED Diagnoses Final diagnoses:  Moderate persistent asthma with acute exacerbation    Rx / DC Orders ED Discharge Orders          Ordered    predniSONE (DELTASONE) 20 MG tablet        01/28/22 1806              Elnora Morrison, MD 01/28/22 1807

## 2022-01-28 NOTE — ED Provider Triage Note (Signed)
Emergency Medicine Provider Triage Evaluation Note  John [name] , a 51 y.o. male  was evaluated in triage.  Pt complains of problems with asthma. Was seen yesterday and discharged after asthma exacerbation, is out of his inhaler. Had another flare this morning and called EMS, gave him 3 breathing treatments today. Pt not complaining of any symptoms at this time except for continued cough. The attack this morning felt worse than the one from the day before.   Review of Systems  Positive: Cough, SOB Negative: CP, fever  Physical Exam  BP (!) 129/92   Pulse (!) 109   Temp 98.1 F (36.7 C)   Resp (!) 22   Ht '5\' 5"'$  (1.651 m)   Wt 64 kg   SpO2 93%   BMI 23.48 kg/m  Gen:   Awake, no distress   Resp:  Normal effort  MSK:   Moves extremities without difficulty  Other:    Medical Decision Making  Medically screening exam initiated at 2:18 PM.  Appropriate orders placed.  John [name] was informed that the remainder of the evaluation will be completed by another provider, this initial triage assessment does not replace that evaluation, and the importance of remaining in the ED until their evaluation is complete.  Workup initiated   Kateri Plummer, PA-C 01/28/22 1421

## 2022-01-28 NOTE — ED Triage Notes (Signed)
Pt reports asthma flare up since yesterday and needs rescue inhaler. Denies CP. Ambulatory to triage, speaking in full sentences.

## 2022-02-11 ENCOUNTER — Encounter (HOSPITAL_COMMUNITY): Payer: Self-pay | Admitting: Emergency Medicine

## 2022-02-11 ENCOUNTER — Emergency Department (HOSPITAL_COMMUNITY)
Admission: EM | Admit: 2022-02-11 | Discharge: 2022-02-11 | Disposition: A | Discharge status: Home / Self Care | Payer: Medicaid Other | Attending: Emergency Medicine | Admitting: Emergency Medicine

## 2022-02-11 DIAGNOSIS — Z79899 Other long term (current) drug therapy: Secondary | ICD-10-CM | POA: Diagnosis not present

## 2022-02-11 DIAGNOSIS — Z7951 Long term (current) use of inhaled steroids: Secondary | ICD-10-CM | POA: Diagnosis not present

## 2022-02-11 DIAGNOSIS — I10 Essential (primary) hypertension: Secondary | ICD-10-CM | POA: Diagnosis not present

## 2022-02-11 DIAGNOSIS — Z5900 Homelessness unspecified: Secondary | ICD-10-CM | POA: Diagnosis not present

## 2022-02-11 DIAGNOSIS — G5702 Lesion of sciatic nerve, left lower limb: Secondary | ICD-10-CM | POA: Insufficient documentation

## 2022-02-11 DIAGNOSIS — Z21 Asymptomatic human immunodeficiency virus [HIV] infection status: Secondary | ICD-10-CM | POA: Insufficient documentation

## 2022-02-11 DIAGNOSIS — M79605 Pain in left leg: Secondary | ICD-10-CM | POA: Diagnosis present

## 2022-02-11 DIAGNOSIS — J45909 Unspecified asthma, uncomplicated: Secondary | ICD-10-CM | POA: Insufficient documentation

## 2022-02-11 DIAGNOSIS — Z7952 Long term (current) use of systemic steroids: Secondary | ICD-10-CM | POA: Diagnosis not present

## 2022-02-11 MED ORDER — ACETAMINOPHEN 500 MG PO TABS
1000.0000 mg | ORAL_TABLET | ORAL | Status: AC
  Administered 2022-02-11: 1000 mg via ORAL
  Filled 2022-02-11: qty 2

## 2022-02-11 NOTE — ED Provider Notes (Signed)
[facility] EMERGENCY DEPARTMENT Provider Note   CSN: [id number] Arrival date & time: 02/11/22  1158     History  Chief Complaint  Patient presents with   Leg Pain    John [name] is a 51 y.o. male.   Leg Pain Patient is a 51 year old male with past medical history significant for HIV, hypertension, homelessness, bipolar, gout, polysubstance abuse, chronic pain, alcoholism, asthma  Patient presented to the emergency room with complaint of left buttocks pain that seems to radiate down his left leg.  Seems he has been present for 6 months he has been prescribed gabapentin but is not currently taking this.  Denies any midline back pain no fevers chills denies any IV drug use when I ask him here.  Denies any saddle anesthesia weakness.  Does endorse some numbness in his left leg but also describes this as more painful and numb.  No difficulty walking.     Home Medications Prior to Admission medications   Medication Sig Start Date End Date Taking? Authorizing Provider  acetaminophen (TYLENOL) 325 MG tablet Take 2 tablets (650 mg total) by mouth every 6 (six) hours as needed for mild pain (or Fever >/= 101). 11/26/21   Little Ishikawa, MD  albuterol (VENTOLIN HFA) 108 (90 Base) MCG/ACT inhaler Inhale 2 puffs into the lungs every 4 (four) hours as needed for wheezing or shortness of breath. 11/26/21   Little Ishikawa, MD  bictegravir-emtricitabine-tenofovir AF (BIKTARVY) 50-200-25 MG TABS tablet Take 1 tablet by mouth daily. 11/26/21   Cumby Callas, NP  busPIRone (BUSPAR) 5 MG tablet Take 1 tablet (5 mg total) by mouth 2 (two) times daily. 11/26/21   Little Ishikawa, MD  cyanocobalamin 1000 MCG tablet Take 1 tablet (1,000 mcg total) by mouth daily. 11/26/21   Little Ishikawa, MD  folic acid (FOLVITE) 1 MG tablet Take 1 tablet (1 mg total) by mouth daily. 11/26/21   Little Ishikawa, MD  Multiple Vitamin (MULTIVITAMIN WITH MINERALS) TABS tablet  Take 1 tablet by mouth daily. 11/26/21   Little Ishikawa, MD  predniSONE (DELTASONE) 20 MG tablet 3 tabs po day one, then 2 po daily x 4 days 01/28/22   Elnora Morrison, MD  QUEtiapine (SEROQUEL) 100 MG tablet Take 1 tablet (100 mg total) by mouth at bedtime as needed (For sleep). 11/26/21   Little Ishikawa, MD  thiamine (VITAMIN B1) 100 MG tablet Take 1 tablet (100 mg total) by mouth daily. 11/26/21   Little Ishikawa, MD  dicyclomine (BENTYL) 20 MG tablet Take 1 tablet (20 mg total) by mouth 2 (two) times daily. 01/08/17 01/03/20  Palumbo, April, MD  sucralfate (CARAFATE) 1 GM/10ML suspension Take 10 mLs (1 g total) by mouth 4 (four) times daily -  with meals and at bedtime. Patient not taking: Reported on 10/21/2017 01/08/17 09/03/18  Palumbo, April, MD      Allergies    Shellfish allergy    Review of Systems   Review of Systems  Physical Exam Updated Vital Signs BP 127/87 (BP Location: Left Arm)   Pulse 78   Temp 98.6 F (37 C)   Resp 18   Ht '5\' 5"'$  (1.651 m)   Wt 64 kg   SpO2 99%   BMI 23.48 kg/m  Physical Exam Vitals and nursing note reviewed.  Constitutional:      General: He is not in acute distress.    Appearance: Normal appearance. He is not ill-appearing.  Comments: Slightly malodorous pleasant 51 year old gentleman in no acute distress  HENT:     Head: Normocephalic and atraumatic.     Mouth/Throat:     Mouth: Mucous membranes are moist.  Eyes:     General: No scleral icterus.       Right eye: No discharge.        Left eye: No discharge.     Conjunctiva/sclera: Conjunctivae normal.  Pulmonary:     Effort: Pulmonary effort is normal.     Breath sounds: No stridor.  Musculoskeletal:     Cervical back: Normal range of motion.     Comments: Tenderness palpation of left gluteus  Skin:    General: Skin is warm and dry.     Comments: Dry cracking skin on feet.  DP PT pulses 3+ and easily palpable.  And left lower extremity  Neurological:     Mental  Status: He is alert and oriented to person, place, and time. Mental status is at baseline.     Comments: Sensation intact in left leg, strength 5/5, good coordination of movement.  Patellar reflexes symmetric.     ED Results / Procedures / Treatments   Labs (all labs ordered are listed, but only abnormal results are displayed) Labs Reviewed - No data to display  EKG None  Radiology No results found.  Procedures Procedures    Medications Ordered in ED Medications  acetaminophen (TYLENOL) tablet 1,000 mg (has no administration in time range)    ED Course/ Medical Decision Making/ A&P                           Medical Decision Making Risk OTC drugs.   Patient is a 51 year old male with past medical history significant for HIV, hypertension, homelessness, bipolar, gout, polysubstance abuse, chronic pain, alcoholism, asthma  Patient presented to the emergency room with complaint of left buttocks pain that seems to radiate down his left leg.  Seems he has been present for 6 months he has been prescribed gabapentin but is not currently taking this.  Denies any midline back pain no fevers chills denies any IV drug use when I ask him here.  Denies any saddle anesthesia weakness.  Does endorse some numbness in his left leg but also describes this as more painful and numb.  No difficulty walking.  Normal neuroexam, no red flag symptoms for back pain and no midline back pain seems to be more muscular likely piriformis syndrome.  Recommend Tylenol and continuation of his prescribed medications.  Return precautions discussed recommend they follow-up with PCP given the information for the [facility] health wellness clinic.  Normal vital signs nontoxic appearing following commands and ambulatory.  Final Clinical Impression(s) / ED Diagnoses Final diagnoses:  Piriformis syndrome of left side    Rx / DC Orders ED Discharge Orders     None         Tedd Sias, [place] 02/11/22  [place], MD 02/11/22 1411

## 2022-02-11 NOTE — ED Triage Notes (Signed)
Pt arrives via EMS from home with left hip and leg pain for 6 months that worsened. Pt reports he takes gabapentin.

## 2022-02-11 NOTE — Discharge Instructions (Addendum)
I suspect your symptoms are related to piriformis syndrome.  Tylenol 1000 mg every 6 hours gabapentin can also be helpful for this and I see that you have been prescribed this in the past.  Please continue take your medications as prescribed

## 2022-02-25 ENCOUNTER — Ambulatory Visit (HOSPITAL_COMMUNITY)
Admission: EM | Admit: 2022-02-25 | Discharge: 2022-02-26 | Disposition: A | Discharge status: Other Acute Inpatient Hospital | Payer: Medicaid Other | Attending: Psychiatry | Admitting: Psychiatry

## 2022-02-25 DIAGNOSIS — R45851 Suicidal ideations: Secondary | ICD-10-CM | POA: Diagnosis not present

## 2022-02-25 DIAGNOSIS — F17200 Nicotine dependence, unspecified, uncomplicated: Secondary | ICD-10-CM

## 2022-02-25 DIAGNOSIS — Z5900 Homelessness unspecified: Secondary | ICD-10-CM | POA: Insufficient documentation

## 2022-02-25 DIAGNOSIS — Z1152 Encounter for screening for COVID-19: Secondary | ICD-10-CM | POA: Diagnosis not present

## 2022-02-25 DIAGNOSIS — J45909 Unspecified asthma, uncomplicated: Secondary | ICD-10-CM | POA: Diagnosis present

## 2022-02-25 DIAGNOSIS — F19959 Other psychoactive substance use, unspecified with psychoactive substance-induced psychotic disorder, unspecified: Secondary | ICD-10-CM | POA: Insufficient documentation

## 2022-02-25 DIAGNOSIS — F1220 Cannabis dependence, uncomplicated: Secondary | ICD-10-CM | POA: Diagnosis present

## 2022-02-25 DIAGNOSIS — Z720 Tobacco use: Secondary | ICD-10-CM | POA: Insufficient documentation

## 2022-02-25 DIAGNOSIS — F5105 Insomnia due to other mental disorder: Secondary | ICD-10-CM

## 2022-02-25 DIAGNOSIS — F411 Generalized anxiety disorder: Secondary | ICD-10-CM | POA: Diagnosis present

## 2022-02-25 DIAGNOSIS — F1420 Cocaine dependence, uncomplicated: Secondary | ICD-10-CM | POA: Diagnosis present

## 2022-02-25 DIAGNOSIS — B20 Human immunodeficiency virus [HIV] disease: Secondary | ICD-10-CM | POA: Insufficient documentation

## 2022-02-25 DIAGNOSIS — F1994 Other psychoactive substance use, unspecified with psychoactive substance-induced mood disorder: Secondary | ICD-10-CM

## 2022-02-25 DIAGNOSIS — F1020 Alcohol dependence, uncomplicated: Secondary | ICD-10-CM | POA: Insufficient documentation

## 2022-02-25 DIAGNOSIS — F4310 Post-traumatic stress disorder, unspecified: Secondary | ICD-10-CM | POA: Diagnosis present

## 2022-02-25 DIAGNOSIS — F99 Mental disorder, not otherwise specified: Secondary | ICD-10-CM

## 2022-02-25 DIAGNOSIS — R7303 Prediabetes: Secondary | ICD-10-CM | POA: Insufficient documentation

## 2022-02-25 LAB — HEMOGLOBIN A1C
Hgb A1c MFr Bld: 5.7 % — ABNORMAL HIGH (ref 4.8–5.6)
Mean Plasma Glucose: 117 mg/dL

## 2022-02-25 LAB — LIPID PANEL
Cholesterol: 152 mg/dL (ref 0–200)
HDL: 54 mg/dL (ref >40)
LDL Cholesterol: 83 mg/dL (ref 0–99)
Total CHOL/HDL Ratio: 2.8 RATIO
Triglycerides: 76 mg/dL (ref <150)
VLDL: 15 mg/dL (ref 0–40)

## 2022-02-25 LAB — POCT URINE DRUG SCREEN - MANUAL ENTRY (I-SCREEN)
POC Amphetamine UR: NOT DETECTED
POC Buprenorphine (BUP): NOT DETECTED
POC Cocaine UR: POSITIVE — AB
POC Marijuana UR: POSITIVE — AB
POC Methadone UR: NOT DETECTED
POC Methamphetamine UR: NOT DETECTED
POC Morphine: NOT DETECTED
POC Oxazepam (BZO): NOT DETECTED
POC Oxycodone UR: NOT DETECTED
POC Secobarbital (BAR): NOT DETECTED

## 2022-02-25 LAB — COMPREHENSIVE METABOLIC PANEL
ALT: 12 U/L (ref 0–44)
AST: 17 U/L (ref 15–41)
Albumin: 3.3 g/dL — ABNORMAL LOW (ref 3.5–5.0)
Alkaline Phosphatase: 75 U/L (ref 38–126)
Anion gap: 9 (ref 5–15)
BUN: 11 mg/dL (ref 6–20)
CO2: 26 mmol/L (ref 22–32)
Calcium: 8.8 mg/dL — ABNORMAL LOW (ref 8.9–10.3)
Chloride: 104 mmol/L (ref 98–111)
Creatinine, Ser: 0.76 mg/dL (ref 0.61–1.24)
GFR, Estimated: >60 mL/min (ref >60)
Glucose, Bld: 70 mg/dL (ref 70–99)
Potassium: 4.3 mmol/L (ref 3.5–5.1)
Sodium: 139 mmol/L (ref 135–145)
Total Bilirubin: 0.2 mg/dL — ABNORMAL LOW (ref 0.3–1.2)
Total Protein: 6.8 g/dL (ref 6.5–8.1)

## 2022-02-25 LAB — CBC WITH DIFFERENTIAL/PLATELET
Abs Immature Granulocytes: 0.01 10*3/uL (ref 0.00–0.07)
Basophils Absolute: 0 10*3/uL (ref 0.0–0.1)
Basophils Relative: 1 %
Eosinophils Absolute: 0.2 10*3/uL (ref 0.0–0.5)
Eosinophils Relative: 3 %
HCT: 38.9 % — ABNORMAL LOW (ref 39.0–52.0)
Hemoglobin: 13 g/dL (ref 13.0–17.0)
Immature Granulocytes: 0 %
Lymphocytes Relative: 35 %
Lymphs Abs: 2 10*3/uL (ref 0.7–4.0)
MCH: 30.2 pg (ref 26.0–34.0)
MCHC: 33.4 g/dL (ref 30.0–36.0)
MCV: 90.3 fL (ref 80.0–100.0)
Monocytes Absolute: 0.7 10*3/uL (ref 0.1–1.0)
Monocytes Relative: 12 %
Neutro Abs: 2.8 10*3/uL (ref 1.7–7.7)
Neutrophils Relative %: 49 %
Platelets: 384 10*3/uL (ref 150–400)
RBC: 4.31 MIL/uL (ref 4.22–5.81)
RDW: 12.9 % (ref 11.5–15.5)
WBC: 5.7 10*3/uL (ref 4.0–10.5)
nRBC: 0 % (ref 0.0–0.2)

## 2022-02-25 LAB — URINALYSIS, ROUTINE W REFLEX MICROSCOPIC
Bilirubin Urine: NEGATIVE
Glucose, UA: NEGATIVE mg/dL
Hgb urine dipstick: NEGATIVE
Ketones, ur: NEGATIVE mg/dL
Leukocytes,Ua: NEGATIVE
Nitrite: NEGATIVE
Protein, ur: NEGATIVE mg/dL
Specific Gravity, Urine: 1.019 (ref 1.005–1.030)
pH: 5 (ref 5.0–8.0)

## 2022-02-25 LAB — POC SARS CORONAVIRUS 2 AG
SARSCOV2ONAVIRUS 2 AG: NEGATIVE
SARSCOV2ONAVIRUS 2 AG: NEGATIVE
SARSCOV2ONAVIRUS 2 AG: NEGATIVE
SARSCOV2ONAVIRUS 2 AG: NEGATIVE
SARSCOV2ONAVIRUS 2 AG: NEGATIVE

## 2022-02-25 LAB — RESP PANEL BY RT-PCR (RSV, FLU A&B, COVID)  RVPGX2
Influenza A by PCR: NEGATIVE
Influenza B by PCR: NEGATIVE
Resp Syncytial Virus by PCR: NEGATIVE
SARS Coronavirus 2 by RT PCR: NEGATIVE

## 2022-02-25 LAB — RAPID URINE DRUG SCREEN, HOSP PERFORMED
Amphetamines: NOT DETECTED
Barbiturates: NOT DETECTED
Benzodiazepines: NOT DETECTED
Cocaine: POSITIVE — AB
Opiates: NOT DETECTED
Tetrahydrocannabinol: NOT DETECTED

## 2022-02-25 LAB — ETHANOL: Alcohol, Ethyl (B): <10 mg/dL (ref <10)

## 2022-02-25 LAB — TSH: TSH: 1.218 u[IU]/mL (ref 0.350–4.500)

## 2022-02-25 MED ORDER — QUETIAPINE FUMARATE 100 MG PO TABS
100.0000 mg | ORAL_TABLET | Freq: Every evening | ORAL | Status: DC | PRN
  Administered 2022-02-25: 100 mg via ORAL
  Filled 2022-02-25: qty 1

## 2022-02-25 MED ORDER — ACETAMINOPHEN 325 MG PO TABS: 650.0000 mg | ORAL_TABLET | Freq: Four times a day (QID) | ORAL | Status: DC | PRN

## 2022-02-25 MED ORDER — TRAZODONE HCL 50 MG PO TABS: 50.0000 mg | ORAL_TABLET | Freq: Every evening | ORAL | Status: DC | PRN

## 2022-02-25 MED ORDER — MAGNESIUM HYDROXIDE 400 MG/5ML PO SUSP: 30.0000 mL | Freq: Every day | ORAL | Status: DC | PRN

## 2022-02-25 MED ORDER — ALBUTEROL SULFATE HFA 108 (90 BASE) MCG/ACT IN AERS: 2.0000 | INHALATION_SPRAY | RESPIRATORY_TRACT | Status: DC | PRN

## 2022-02-25 MED ORDER — HYDROXYZINE HCL 25 MG PO TABS
25.0000 mg | ORAL_TABLET | Freq: Three times a day (TID) | ORAL | Status: DC | PRN
  Administered 2022-02-25: 25 mg via ORAL
  Filled 2022-02-25: qty 1

## 2022-02-25 MED ORDER — BUSPIRONE HCL 5 MG PO TABS
5.0000 mg | ORAL_TABLET | Freq: Two times a day (BID) | ORAL | Status: DC
  Administered 2022-02-25 – 2022-02-26 (×3): 5 mg via ORAL
  Filled 2022-02-25 (×3): qty 1

## 2022-02-25 MED ORDER — ALUM & MAG HYDROXIDE-SIMETH 200-200-20 MG/5ML PO SUSP: 30.0000 mL | ORAL | Status: DC | PRN
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
## 2022-02-25 NOTE — Progress Notes (Signed)
   02/25/22 1119  [place] (Walk-ins at [facility] only)  How Did You Hear About [name]? Self  What Is the Reason for Your Visit/Call Today? Pt is a 51 yo male who presented voluntarily and unaccompanied after he called law enforcement to bring him in for assessment due to his worsening schizophrenia symptoms. Pt endorsed current SI with a plan to jump from a bridge or cut his wrists to kill himself. Pt endorsed feelings of wanting to "kiil people" in general. Pt endorsed AVH in which he stated he hears voices telling him to kill himself and others and stated he sees multiple animals in a variety of colors. Pt stated he feels paranoid and thinks people are "always following me" and "watching me." Pt stated that he has had 1 suicide attempt in the past in eith 1990 or 1991. Pt stated he was hospitalized after the attempt and stated this was his only IP psychiatric stay.  How Long Has This Been Causing You Problems? > than 6 months  Have You Recently Had Any Thoughts About Hurting Yourself? Yes  How long ago did you have thoughts about hurting yourself? today  Are You Planning to [place] At This time? Yes  Have you Recently Had Thoughts About Hurting Someone [name]? Yes  How long ago did you have thoughts of harming others? stated wants to hurt or kill people in general  Are You Planning To Harm Someone At This Time? No  Are you currently experiencing any auditory, visual or other hallucinations? Yes  Please explain the hallucinations you are currently experiencing: hearing voices telling him to kill himself and hurt others; seeing animals in various colors.  Have You Used Any Alcohol or Drugs in the Past 24 Hours? No (Reported regular but infrequent use of alcohol, cannabis and crack cocaine. Stated uses crack daily.)  Do you have any current medical co-morbidities that require immediate attention? No (None reported)  Clinician description of patient physical  appearance/behavior: Pt was calm, cooperative, reatless and jittery. Pt's insight and judgment seemed poor. Pt's mood seemed depressed and his flat affect was congruent.  What Do You Feel Would Help You the Most Today? Treatment for Depression or other mood problem  If access to [facility] was not available, would you have sought care in the Emergency Department? Yes  Determination of Need Urgent (48 hours)  Options For Referral Facility-Based Crisis   Emon Lance T. Mare Ferrari, [place], [facility], [facility] Triage Specialist [facility]

## 2022-02-25 NOTE — ED Notes (Signed)
Pt A&O x 4, sleeping at present, no distress noted.  Monitoring for safety.

## 2022-02-25 NOTE — ED Provider Notes (Signed)
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   POC Oxazepam  (BZO) 02/25/2022 None Detected  NONE DETECTED (Cut Off Level 300 ng/mL) Final   POC Cocaine UR 02/25/2022 Positive (A)  NONE DETECTED (Cut Off Level 300 ng/mL) Final   POC Methamphetamine UR 02/25/2022 None Detected  NONE DETECTED (Cut Off Level 1000 ng/mL) Final   POC Morphine 02/25/2022 None Detected  NONE DETECTED (Cut Off Level 300 ng/mL) Final   POC Methadone UR 02/25/2022 None Detected  NONE DETECTED (Cut Off Level 300 ng/mL) Final   POC Oxycodone UR 02/25/2022 None Detected  NONE DETECTED (Cut Off Level 100 ng/mL) Final   POC Marijuana UR 02/25/2022 Positive (A)  NONE DETECTED (Cut Off Level 50 ng/mL) Final  Admission on 01/28/2022, Discharged on 01/28/2022  Component Date Value Ref Range Status   WBC 01/28/2022 6.5  4.0 - 10.5 K/uL Final   RBC 01/28/2022 4.59  4.22 - 5.81 MIL/uL Final   Hemoglobin 01/28/2022 14.1  13.0 - 17.0 g/dL Final   HCT 01/28/2022 42.0  39.0 - 52.0 % Final   MCV 01/28/2022 91.5  80.0 - 100.0 fL Final   MCH 01/28/2022 30.7  26.0 - 34.0 pg Final   MCHC 01/28/2022 33.6  30.0 - 36.0 g/dL Final   RDW 01/28/2022 12.8  11.5 - 15.5 % Final   Platelets 01/28/2022 358  150 - 400 K/uL Final   nRBC 01/28/2022 0.0  0.0 - 0.2 % Final   Performed at [facility], [place] [address] [place], [place] [address]   Sodium 01/28/2022 139  135 - 145 mmol/L Final   Potassium 01/28/2022 4.4  3.5 - 5.1 mmol/L Final   Chloride 01/28/2022 102  98 - 111 mmol/L Final   CO2 01/28/2022 26  22 - 32 mmol/L Final   Glucose, Bld 01/28/2022 115 (H)  70 - 99 mg/dL Final   Glucose reference range applies only to samples taken after fasting for at least 8 hours.   BUN 01/28/2022 16  6 - 20 mg/dL Final   Creatinine, Ser 01/28/2022 0.96  0.61 - 1.24 mg/dL Final   Calcium 01/28/2022 9.2  8.9 - 10.3 mg/dL Final   GFR, Estimated 01/28/2022 >  60  >60 mL/min Final   Comment: (NOTE) Calculated using the CKD-EPI Creatinine Equation (2021)    Anion gap 01/28/2022 11  5 - 15 Final   Performed at  [facility], [place] [address] [place], [place] [address]   D-Dimer, Quant 01/28/2022 <0.27  0.00 - 0.50 ug/mL-FEU Final   Comment: (NOTE) At the manufacturer cut-off value of 0.5 g/mL FEU, this assay has a negative predictive value of 95-100%.This assay is intended for use in conjunction with a clinical pretest probability (PTP) assessment model to exclude pulmonary embolism (PE) and deep venous thrombosis (DVT) in outpatients suspected of PE or DVT. Results should be correlated with clinical presentation. Performed at [facility], [place] [address] [place], [place] [address]   Admission on 01/27/2022, Discharged on 01/27/2022  Component Date Value Ref Range Status   SARS Coronavirus 2 by RT PCR 01/27/2022 NEGATIVE  NEGATIVE Final   Comment: (NOTE) SARS-CoV-2 target nucleic acids are NOT DETECTED.  The SARS-CoV-2 RNA is generally detectable in upper respiratory specimens during the acute phase of infection. The lowest concentration of SARS-CoV-2 viral copies this assay can detect is 138 copies/mL. A negative result does not preclude SARS-Cov-2 infection and should not be used as the sole basis for treatment or other patient management decisions. A negative result may occur with  improper specimen collection/handling, submission of specimen other than nasopharyngeal swab, presence of viral mutation(s) within the areas targeted by this assay, and inadequate number of viral copies(<138 copies/mL). A negative result must be combined with clinical observations, patient history, and epidemiological information. The expected result is Negative.  Fact Sheet for Patients:  EntrepreneurPulse.com.au  Fact Sheet for Healthcare Providers:  IncredibleEmployment.be  This test is no                          t yet approved or cleared by the [place] FDA and  has been authorized for detection and/or diagnosis of SARS-CoV-2 by FDA under an  Emergency Use Authorization (EUA). This EUA will remain  in effect (meaning this test can be used) for the duration of the COVID-19 declaration under Section 564(b)(1) of the Act, 21 U.S.C.section 360bbb-3(b)(1), unless the authorization is terminated  or revoked sooner.       Influenza A by PCR 01/27/2022 NEGATIVE  NEGATIVE Final   Influenza B by PCR 01/27/2022 NEGATIVE  NEGATIVE Final   Comment: (NOTE) The Xpert Xpress SARS-CoV-2/FLU/RSV plus assay is intended as an aid in the diagnosis of influenza from Nasopharyngeal swab specimens and should not be used as a sole basis for treatment. Nasal washings and aspirates are unacceptable for Xpert Xpress SARS-CoV-2/FLU/RSV testing.  Fact Sheet for Patients: EntrepreneurPulse.com.au  Fact Sheet for Healthcare Providers: IncredibleEmployment.be  This test is not yet approved or cleared by the [place] FDA and has been authorized for detection and/or diagnosis of SARS-CoV-2 by FDA under an Emergency Use Authorization (EUA). This EUA will remain in effect (meaning this test can be used) for the duration of the COVID-19 declaration under Section 564(b)(1) of the Act, 21 U.S.C. section 360bbb-3(b)(1), unless the authorization is terminated or revoked.  Performed at [facility], [place] [address] [place], [place] [address]    Sodium 01/27/2022 139  135 - 145 mmol/L Final   Potassium 01/27/2022 4.5  3.5 - 5.1 mmol/L Final   Chloride 01/27/2022 101  98 - 111 mmol/L Final   CO2 01/27/2022 24  22 -  32 mmol/L Final   Glucose, Bld 01/27/2022 91  70 - 99 mg/dL Final   Glucose reference range applies only to samples taken after fasting for at least 8 hours.   BUN 01/27/2022 8  6 - 20 mg/dL Final   Creatinine, Ser 01/27/2022 0.75  0.61 - 1.24 mg/dL Final   Calcium 01/27/2022 9.1  8.9 - 10.3 mg/dL Final   GFR, Estimated 01/27/2022 >60  >60 mL/min Final   Comment: (NOTE) Calculated using the CKD-EPI  Creatinine Equation (2021)    Anion gap 01/27/2022 14  5 - 15 Final   Performed at [facility], [place] [address], [name], [place] [address]   WBC 01/27/2022 7.1  4.0 - 10.5 K/uL Final   RBC 01/27/2022 4.65  4.22 - 5.81 MIL/uL Final   Hemoglobin 01/27/2022 14.0  13.0 - 17.0 g/dL Final   HCT 01/27/2022 44.6  39.0 - 52.0 % Final   MCV 01/27/2022 95.9  80.0 - 100.0 fL Final   MCH 01/27/2022 30.1  26.0 - 34.0 pg Final   MCHC 01/27/2022 31.4  30.0 - 36.0 g/dL Final   RDW 01/27/2022 12.6  11.5 - 15.5 % Final   Platelets 01/27/2022 332  150 - 400 K/uL Final   nRBC 01/27/2022 0.0  0.0 - 0.2 % Final   Neutrophils Relative % 01/27/2022 44  % Final   Neutro Abs 01/27/2022 3.2  1.7 - 7.7 K/uL Final   Lymphocytes Relative 01/27/2022 39  % Final   Lymphs Abs 01/27/2022 2.7  0.7 - 4.0 K/uL Final   Monocytes Relative 01/27/2022 11  % Final   Monocytes Absolute 01/27/2022 0.8  0.1 - 1.0 K/uL Final   Eosinophils Relative 01/27/2022 5  % Final   Eosinophils Absolute 01/27/2022 0.4  0.0 - 0.5 K/uL Final   Basophils Relative 01/27/2022 1  % Final   Basophils Absolute 01/27/2022 0.0  0.0 - 0.1 K/uL Final   Immature Granulocytes 01/27/2022 0  % Final   Abs Immature Granulocytes 01/27/2022 0.01  0.00 - 0.07 K/uL Final   Performed at [facility], [place] [address] [place], [place] [address]  Admission on 12/01/2021, Discharged on 12/01/2021  Component Date Value Ref Range Status   Sodium 12/01/2021 139  135 - 145 mmol/L Final   Potassium 12/01/2021 3.3 (L)  3.5 - 5.1 mmol/L Final   Chloride 12/01/2021 104  98 - 111 mmol/L Final   CO2 12/01/2021 26  22 - 32 mmol/L Final   Glucose, Bld 12/01/2021 78  70 - 99 mg/dL Final   Glucose reference range applies only to samples taken after fasting for at least 8 hours.   BUN 12/01/2021 11  6 - 20 mg/dL Final   Creatinine, Ser 12/01/2021 0.68  0.61 - 1.24 mg/dL Final   Calcium 12/01/2021 8.7 (L)  8.9 - 10.3 mg/dL Final   Total Protein 12/01/2021 6.6  6.5  - 8.1 g/dL Final   Albumin 12/01/2021 2.9 (L)  3.5 - 5.0 g/dL Final   AST 12/01/2021 15  15 - 41 U/L Final   ALT 12/01/2021 10  0 - 44 U/L Final   Alkaline Phosphatase 12/01/2021 61  38 - 126 U/L Final   Total Bilirubin 12/01/2021 0.4  0.3 - 1.2 mg/dL Final   GFR, Estimated 12/01/2021 >60  >60 mL/min Final   Comment: (NOTE) Calculated using the CKD-EPI Creatinine Equation (2021)    Anion gap 12/01/2021 9  5 - 15 Final   Performed at [facility], [place] [address] [place], [place] [address]   WBC 12/01/2021 7.1  4.0 - 10.5 K/uL Final   RBC 12/01/2021 3.20 (L)  4.22 - 5.81 MIL/uL Final   Hemoglobin 12/01/2021 10.1 (L)  13.0 - 17.0 g/dL Final   HCT 12/01/2021 30.6 (L)  39.0 - 52.0 % Final   MCV 12/01/2021 95.6  80.0 - 100.0 fL Final   MCH 12/01/2021 31.6  26.0 - 34.0 pg Final   MCHC 12/01/2021 33.0  30.0 - 36.0 g/dL Final   RDW 12/01/2021 13.2  11.5 - 15.5 % Final   Platelets 12/01/2021 628 (H)  150 - 400 K/uL Final   nRBC 12/01/2021 0.0  0.0 - 0.2 % Final   Neutrophils Relative % 12/01/2021 61  % Final   Neutro Abs 12/01/2021 4.3  1.7 - 7.7 K/uL Final   Lymphocytes Relative 12/01/2021 25  % Final   Lymphs Abs 12/01/2021 1.8  0.7 - 4.0 K/uL Final   Monocytes Relative 12/01/2021 11  % Final   Monocytes Absolute 12/01/2021 0.8  0.1 - 1.0 K/uL Final   Eosinophils Relative 12/01/2021 2  % Final   Eosinophils Absolute 12/01/2021 0.2  0.0 - 0.5 K/uL Final   Basophils Relative 12/01/2021 1  % Final   Basophils Absolute 12/01/2021 0.0  0.0 - 0.1 K/uL Final   Immature Granulocytes 12/01/2021 0  % Final   Abs Immature Granulocytes 12/01/2021 0.03  0.00 - 0.07 K/uL Final   Performed at [facility], [place] [address], [place], [place] [address]   Lipase 12/01/2021 29  11 - 51 U/L Final   Performed at [place] [address], [place], [place] [address]  No results displayed because visit has over 200 results.    Admission on 09/02/2021, Discharged on 09/03/2021  Component  Date Value Ref Range Status   WBC 09/02/2021 3.0 (L)  4.0 - 10.5 K/uL Final   RBC 09/02/2021 4.98  4.22 - 5.81 MIL/uL Final   Hemoglobin 09/02/2021 15.6  13.0 - 17.0 g/dL Final   HCT 09/02/2021 47.4  39.0 - 52.0 % Final   MCV 09/02/2021 95.2  80.0 - 100.0 fL Final   MCH 09/02/2021 31.3  26.0 - 34.0 pg Final   MCHC 09/02/2021 32.9  30.0 - 36.0 g/dL Final   RDW 09/02/2021 12.3  11.5 - 15.5 % Final   Platelets 09/02/2021 314  150 - 400 K/uL Final   nRBC 09/02/2021 0.0  0.0 - 0.2 % Final   Neutrophils Relative % 09/02/2021 24  % Final   Neutro Abs 09/02/2021 0.7 (L)  1.7 - 7.7 K/uL Final   Lymphocytes Relative 09/02/2021 54  % Final   Lymphs Abs 09/02/2021 1.6  0.7 - 4.0 K/uL Final   Monocytes Relative 09/02/2021 13  % Final   Monocytes Absolute 09/02/2021 0.4  0.1 - 1.0 K/uL Final   Eosinophils Relative 09/02/2021 3  % Final   Eosinophils Absolute 09/02/2021 0.1  0.0 - 0.5 K/uL Final   Basophils Relative 09/02/2021 5  % Final   Basophils Absolute 09/02/2021 0.2 (H)  0.0 - 0.1 K/uL Final   nRBC 09/02/2021 0  0 /100 WBC Final   Myelocytes 09/02/2021 1  % Final   Abs Immature Granulocytes 09/02/2021 0.00  0.00 - 0.07 K/uL Final   Performed at [facility], [place] [address] [place], [place] [address]   Sodium 09/02/2021 139  135 - 145 mmol/L Final   Potassium 09/02/2021  4.5  3.5 - 5.1 mmol/L Final   Chloride 09/02/2021 102  98 - 111 mmol/L Final   CO2 09/02/2021 24  22 - 32 mmol/L Final   Glucose, Bld 09/02/2021 92  70 - 99 mg/dL Final   Glucose reference range applies only to samples taken after fasting for at least 8 hours.   BUN 09/02/2021 22 (H)  6 - 20 mg/dL Final   Creatinine, Ser 09/02/2021 0.77  0.61 - 1.24 mg/dL Final   Calcium 09/02/2021 8.8 (L)  8.9 - 10.3 mg/dL Final   Total Protein 09/02/2021 6.5  6.5 - 8.1 g/dL Final   Albumin 09/02/2021 3.5  3.5 - 5.0 g/dL Final   AST 09/02/2021 33  15 - 41 U/L Final   ALT 09/02/2021 20  0 - 44 U/L Final   Alkaline Phosphatase  09/02/2021 81  38 - 126 U/L Final   Total Bilirubin 09/02/2021 0.6  0.3 - 1.2 mg/dL Final   GFR, Estimated 09/02/2021 >60  >60 mL/min Final   Comment: (NOTE) Calculated using the CKD-EPI Creatinine Equation (2021)    Anion gap 09/02/2021 13  5 - 15 Final   Performed at [facility], [place] [address], [place], [place] [address]   Alcohol, Ethyl (B) 09/02/2021 <10  <10 mg/dL Final   Comment: (NOTE) Lowest detectable limit for serum alcohol is 10 mg/dL.  For medical purposes only. Performed at [facility], [place] [address], [place], [place] [address]    CD4 T Cell Abs 09/02/2021 568  400 - 1,790 /uL Final   CD4 % Helper T Cell 09/02/2021 35  33 - 65 % Final   Performed at [facility], [place] [address] [place], [place] [address]   Path Review 09/02/2021 Severe neutropenia. Negative for blasts and myelodysplastic features.   Final   Comment: Reviewed by Reesa Chew. [place], M.D. 09/03/2021 Performed at [facility], [place] [address], [place], [place] [address]   Admission on 09/02/2021, Discharged on 09/02/2021  Component Date Value Ref Range Status   SARS Coronavirus 2 by RT PCR 09/02/2021 POSITIVE (A)  NEGATIVE Final   Comment: (NOTE) SARS-CoV-2 target nucleic acids are DETECTED.  The SARS-CoV-2 RNA is generally detectable in upper respiratory specimens during the acute phase of infection. Positive results are indicative of the presence of the identified virus, but do not rule out bacterial infection or co-infection with other pathogens not detected by the test. Clinical correlation with patient history and other diagnostic information is necessary to determine patient infection status. The expected result is Negative.  Fact Sheet for Patients: EntrepreneurPulse.com.au  Fact Sheet for Healthcare Providers: IncredibleEmployment.be  This test is not yet approved or cleared by the [place] FDA and  has been  authorized for detection and/or diagnosis of SARS-CoV-2 by FDA under an Emergency Use Authorization (EUA).  This EUA will remain in effect (meaning this test can be used) for the duration of  the COVID-19 declaration under Section 564(b)(1) of the A                          ct, 21 U.S.C. section 360bbb-3(b)(1), unless the authorization is terminated or revoked sooner.     Influenza A by PCR 09/02/2021 NEGATIVE  NEGATIVE Final   Influenza B by PCR 09/02/2021 NEGATIVE  NEGATIVE Final   Comment: (NOTE) The Xpert Xpress SARS-CoV-2/FLU/RSV plus assay is intended as an aid in the diagnosis of influenza from  Nasopharyngeal swab specimens and should not be used as a sole basis for treatment. Nasal washings and aspirates are unacceptable for Xpert Xpress SARS-CoV-2/FLU/RSV testing.  Fact Sheet for Patients: EntrepreneurPulse.com.au  Fact Sheet for Healthcare Providers: IncredibleEmployment.be  This test is not yet approved or cleared by the [place] FDA and has been authorized for detection and/or diagnosis of SARS-CoV-2 by FDA under an Emergency Use Authorization (EUA). This EUA will remain in effect (meaning this test can be used) for the duration of the COVID-19 declaration under Section 564(b)(1) of the Act, 21 U.S.C. section 360bbb-3(b)(1), unless the authorization is terminated or revoked.  Performed at [facility], [place] [address] [place], [place] [address]    SARSCOV2ONAVIRUS 2 AG 09/02/2021 POSITIVE (A)  NEGATIVE Final   Comment: (NOTE) SARS-CoV-2 antigen PRESENT.  Positive results indicate the presence of viral antigens, but clinical correlation with patient history and other diagnostic information is necessary to determine patient infection status.  Positive results do not rule out bacterial infection or co-infection  with other viruses. False positive results are rare but can occur, and confirmatory RT-PCR testing may be  appropriate in some circumstances. The expected result is Negative.  Fact Sheet for Patients: HandmadeRecipes.com.cy  Fact Sheet for Healthcare Providers: FuneralLife.at  This test is not yet approved or cleared by the [place] FDA and  has been authorized for detection and/or diagnosis of SARS-CoV-2 by FDA under an Emergency Use Authorization (EUA).  This EUA will remain in effect (meaning this test can be used) for the duration of  the COVID-19 declaration under Section 564(b)(1) of the Act, 21 U.S.C. section [phone](                          b)(1), unless the authorization is terminated or revoked sooner.      Allergies: Shellfish allergy  PTA Medications: (Not in a hospital admission)   Medical Decision Making  Assessment: I do not suspect he is forthcoming regarding his substance use frequency nor do I think he has schizophrenia. 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 He denies ever experiencing manic/hypomanic symptoms which calls to question if he has bipolar disorder. He also appears to overpresent some of his symptoms including AVH and depressive symptoms given he decided to come voluntarily and contracts for safety despite also expressing his desire to kill himself and to kill anyone who gets in his way. That being said, he would benefit from being restarted on his buspar for anxiety and seroquel for mood symptoms. I also suspect his homelessness is playing a role into his presentation.  Plan -Admit to obs and reassess tomorrow morning for SI/HI/AVH, can admit to inpatient if necessary or consider [facility] if interested in substance rehab -Restart seroquel 100 mg qhs for depressive symptoms and hallucinations -Restart buspar 5 mg bid for anxiety     Recommendations  Based on my evaluation the patient does not appear to have an emergency medical  condition.  France Ravens, MD 02/25/22  4:10 PM

## 2022-02-26 ENCOUNTER — Encounter (HOSPITAL_COMMUNITY): Payer: Self-pay | Admitting: Psychiatry

## 2022-02-26 ENCOUNTER — Encounter (HOSPITAL_COMMUNITY): Payer: Self-pay | Admitting: Student

## 2022-02-26 ENCOUNTER — Other Ambulatory Visit: Payer: Self-pay

## 2022-02-26 ENCOUNTER — Inpatient Hospital Stay (HOSPITAL_COMMUNITY)
Admission: AD | Admit: 2022-02-26 | Discharge: 2022-03-06 | DRG: 885 | Disposition: A | Discharge status: Home / Self Care | Payer: Medicaid Other | Source: Intra-hospital | Attending: Psychiatry | Admitting: Psychiatry

## 2022-02-26 DIAGNOSIS — Z91410 Personal history of adult physical and sexual abuse: Secondary | ICD-10-CM | POA: Diagnosis not present

## 2022-02-26 DIAGNOSIS — Z1152 Encounter for screening for COVID-19: Secondary | ICD-10-CM | POA: Diagnosis not present

## 2022-02-26 DIAGNOSIS — F333 Major depressive disorder, recurrent, severe with psychotic symptoms: Secondary | ICD-10-CM | POA: Diagnosis present

## 2022-02-26 DIAGNOSIS — Z638 Other specified problems related to primary support group: Secondary | ICD-10-CM | POA: Diagnosis not present

## 2022-02-26 DIAGNOSIS — Z8701 Personal history of pneumonia (recurrent): Secondary | ICD-10-CM

## 2022-02-26 DIAGNOSIS — R45850 Homicidal ideations: Secondary | ICD-10-CM | POA: Diagnosis present

## 2022-02-26 DIAGNOSIS — F14251 Cocaine dependence with cocaine-induced psychotic disorder with hallucinations: Secondary | ICD-10-CM | POA: Diagnosis present

## 2022-02-26 DIAGNOSIS — R7303 Prediabetes: Secondary | ICD-10-CM | POA: Insufficient documentation

## 2022-02-26 DIAGNOSIS — F1020 Alcohol dependence, uncomplicated: Secondary | ICD-10-CM | POA: Diagnosis present

## 2022-02-26 DIAGNOSIS — I10 Essential (primary) hypertension: Secondary | ICD-10-CM | POA: Diagnosis present

## 2022-02-26 DIAGNOSIS — M545 Low back pain, unspecified: Secondary | ICD-10-CM | POA: Diagnosis present

## 2022-02-26 DIAGNOSIS — J45909 Unspecified asthma, uncomplicated: Secondary | ICD-10-CM | POA: Diagnosis present

## 2022-02-26 DIAGNOSIS — F411 Generalized anxiety disorder: Secondary | ICD-10-CM | POA: Diagnosis present

## 2022-02-26 DIAGNOSIS — F1424 Cocaine dependence with cocaine-induced mood disorder: Secondary | ICD-10-CM | POA: Diagnosis present

## 2022-02-26 DIAGNOSIS — Z5900 Homelessness unspecified: Secondary | ICD-10-CM | POA: Diagnosis not present

## 2022-02-26 DIAGNOSIS — M25559 Pain in unspecified hip: Secondary | ICD-10-CM | POA: Diagnosis present

## 2022-02-26 DIAGNOSIS — F17210 Nicotine dependence, cigarettes, uncomplicated: Secondary | ICD-10-CM | POA: Diagnosis present

## 2022-02-26 DIAGNOSIS — F1420 Cocaine dependence, uncomplicated: Secondary | ICD-10-CM | POA: Diagnosis present

## 2022-02-26 DIAGNOSIS — B20 Human immunodeficiency virus [HIV] disease: Secondary | ICD-10-CM | POA: Diagnosis present

## 2022-02-26 DIAGNOSIS — R45851 Suicidal ideations: Secondary | ICD-10-CM | POA: Diagnosis present

## 2022-02-26 DIAGNOSIS — F12251 Cannabis dependence with psychotic disorder with hallucinations: Secondary | ICD-10-CM | POA: Diagnosis present

## 2022-02-26 DIAGNOSIS — F4310 Post-traumatic stress disorder, unspecified: Secondary | ICD-10-CM | POA: Diagnosis present

## 2022-02-26 DIAGNOSIS — Z91199 Patient's noncompliance with other medical treatment and regimen due to unspecified reason: Secondary | ICD-10-CM | POA: Diagnosis not present

## 2022-02-26 DIAGNOSIS — Z818 Family history of other mental and behavioral disorders: Secondary | ICD-10-CM

## 2022-02-26 DIAGNOSIS — Z811 Family history of alcohol abuse and dependence: Secondary | ICD-10-CM

## 2022-02-26 DIAGNOSIS — Z6372 Alcoholism and drug addiction in family: Secondary | ICD-10-CM

## 2022-02-26 DIAGNOSIS — F17200 Nicotine dependence, unspecified, uncomplicated: Secondary | ICD-10-CM | POA: Diagnosis present

## 2022-02-26 DIAGNOSIS — F209 Schizophrenia, unspecified: Principal | ICD-10-CM | POA: Diagnosis present

## 2022-02-26 DIAGNOSIS — M109 Gout, unspecified: Secondary | ICD-10-CM | POA: Diagnosis present

## 2022-02-26 DIAGNOSIS — F19959 Other psychoactive substance use, unspecified with psychoactive substance-induced psychotic disorder, unspecified: Secondary | ICD-10-CM | POA: Diagnosis present

## 2022-02-26 DIAGNOSIS — F1994 Other psychoactive substance use, unspecified with psychoactive substance-induced mood disorder: Secondary | ICD-10-CM | POA: Diagnosis present

## 2022-02-26 DIAGNOSIS — R109 Unspecified abdominal pain: Secondary | ICD-10-CM | POA: Diagnosis present

## 2022-02-26 DIAGNOSIS — Z21 Asymptomatic human immunodeficiency virus [HIV] infection status: Secondary | ICD-10-CM | POA: Diagnosis present

## 2022-02-26 DIAGNOSIS — G8929 Other chronic pain: Secondary | ICD-10-CM | POA: Diagnosis present

## 2022-02-26 MED ORDER — HYDROXYZINE HCL 25 MG PO TABS
25.0000 mg | ORAL_TABLET | Freq: Three times a day (TID) | ORAL | Status: DC | PRN
  Administered 2022-02-27 – 2022-03-05 (×7): 25 mg via ORAL
  Filled 2022-02-26: qty 15
  Filled 2022-02-26 (×9): qty 1

## 2022-02-26 MED ORDER — ALUM & MAG HYDROXIDE-SIMETH 200-200-20 MG/5ML PO SUSP
30.0000 mL | ORAL | Status: DC | PRN
  Administered 2022-02-27: 30 mL via ORAL
  Filled 2022-02-26: qty 30

## 2022-02-26 MED ORDER — TRAZODONE HCL 50 MG PO TABS
50.0000 mg | ORAL_TABLET | Freq: Every evening | ORAL | Status: DC | PRN
  Administered 2022-02-27 – 2022-03-05 (×8): 50 mg via ORAL
  Filled 2022-02-26 (×4): qty 1
  Filled 2022-02-26: qty 10
  Filled 2022-02-26 (×4): qty 1

## 2022-02-26 MED ORDER — ALBUTEROL SULFATE HFA 108 (90 BASE) MCG/ACT IN AERS
2.0000 | INHALATION_SPRAY | RESPIRATORY_TRACT | Status: DC | PRN
  Administered 2022-02-27 – 2022-03-05 (×5): 2 via RESPIRATORY_TRACT
  Filled 2022-02-26: qty 6.7

## 2022-02-26 MED ORDER — ACETAMINOPHEN 325 MG PO TABS
650.0000 mg | ORAL_TABLET | Freq: Four times a day (QID) | ORAL | Status: DC | PRN
  Administered 2022-02-27 – 2022-03-02 (×6): 650 mg via ORAL
  Filled 2022-02-26 (×6): qty 2

## 2022-02-26 MED ORDER — MAGNESIUM HYDROXIDE 400 MG/5ML PO SUSP: 30.0000 mL | Freq: Every day | ORAL | Status: DC | PRN

## 2022-02-26 MED ORDER — QUETIAPINE FUMARATE 100 MG PO TABS: 100.0000 mg | ORAL_TABLET | Freq: Every evening | ORAL | Status: DC | PRN

## 2022-02-26 MED ORDER — BICTEGRAVIR-EMTRICITAB-TENOFOV 50-200-25 MG PO TABS
1.0000 | ORAL_TABLET | Freq: Every day | ORAL | Status: DC
  Administered 2022-02-27 – 2022-03-06 (×8): 1 via ORAL
  Filled 2022-02-26 (×4): qty 1
  Filled 2022-02-26: qty 10
  Filled 2022-02-26 (×4): qty 1

## 2022-02-26 MED ORDER — BUSPIRONE HCL 5 MG PO TABS
5.0000 mg | ORAL_TABLET | Freq: Two times a day (BID) | ORAL | Status: DC
  Administered 2022-02-26 – 2022-02-27 (×2): 5 mg via ORAL
  Filled 2022-02-26 (×7): qty 1

## 2022-02-26 MED ORDER — BICTEGRAVIR-EMTRICITAB-TENOFOV 50-200-25 MG PO TABS
1.0000 | ORAL_TABLET | Freq: Every day | ORAL | Status: DC
  Administered 2022-02-26: 1 via ORAL
  Filled 2022-02-26: qty 1

## 2022-02-26 NOTE — Progress Notes (Signed)
Pt admitted to [facility] at this time. Pt presents with irritable affect, fidgety in chair during assessment. Pt reports seeking admission for suicidal ideations with a plan to jump from bridge or slice wrists. Pt endorses auditory hallucinations that are commanding him to "burn the world down" and "kill everyone." Pt endorses visual hallucinations as well. Pt endorses homelessness and states stressors include financial/housing. Pt denies substance use however tox screen was positive for cocaine and thc. Pt reports alcohol use is seldom and sporadic, last use "forever ago." Pt continues to endorse suicidal thoughts, does contract for safety at this time. Pt states he is allergic to shellfish, states he has been off of any psych meds for at least a year. Q 15 minute checks initiated.

## 2022-02-26 NOTE — ED Notes (Signed)
Pt sleeping at present, no distress noted.  Monitoring for safety. 

## 2022-02-26 NOTE — ED Notes (Signed)
Patient discharged to [facility] at this time via safe transport. Patient is A&Ox4. Denies SI, HI, and A/V/H with no plan/intent at this time although does report auditory/visual hallucinations at times that has since then been improving. Patient denies any pain. Printed AVS reviewed and placed in brown folder along with voluntary consent, EKG, EMTALA, and other transferring paperwork. Valuables/belongings returned and sent with patient. No s/s of current distress.

## 2022-02-26 NOTE — ED Notes (Signed)
Patient observed resting. Patient got up to eat breakfast and is med compliant. No s/s of current distress. Remains cooperative at this time.

## 2022-02-26 NOTE — ED Provider Notes (Addendum)
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 Morphine 02/25/2022 None Detected  NONE DETECTED (Cut Off Level 300 ng/mL) Final   POC Methadone UR 02/25/2022 None Detected  NONE DETECTED (Cut Off Level 300 ng/mL) Final   POC Oxycodone UR 02/25/2022 None Detected  NONE DETECTED (Cut Off Level 100 ng/mL) Final   POC  Marijuana UR 02/25/2022 Positive (A)  NONE DETECTED (Cut Off Level 50 ng/mL) Final  Admission on 01/28/2022, Discharged on 01/28/2022  Component Date Value Ref Range Status   WBC 01/28/2022 6.5  4.0 - 10.5 K/uL Final   RBC 01/28/2022 4.59  4.22 - 5.81 MIL/uL Final   Hemoglobin 01/28/2022 14.1  13.0 - 17.0 g/dL Final   HCT 01/28/2022 42.0  39.0 - 52.0 % Final   MCV 01/28/2022 91.5  80.0 - 100.0 fL Final   MCH 01/28/2022 30.7  26.0 - 34.0 pg Final   MCHC 01/28/2022 33.6  30.0 - 36.0 g/dL Final   RDW 01/28/2022 12.8  11.5 - 15.5 % Final   Platelets  01/28/2022 358  150 - 400 K/uL Final   nRBC 01/28/2022 0.0  0.0 - 0.2 % Final   Performed at [facility] [address], [place], [place] [address]   Sodium 01/28/2022 139  135 - 145 mmol/L Final   Potassium 01/28/2022 4.4  3.5 - 5.1 mmol/L Final   Chloride 01/28/2022 102  98 - 111 mmol/L Final   CO2 01/28/2022 26  22 - 32 mmol/L Final   Glucose, Bld 01/28/2022 115 (H)  70 - 99 mg/dL Final   Glucose reference range applies only to samples taken after fasting for at least 8 hours.   BUN 01/28/2022 16  6 - 20 mg/dL Final   Creatinine, Ser 01/28/2022 0.96  0.61 - 1.24 mg/dL Final   Calcium 01/28/2022 9.2  8.9 - 10.3 mg/dL Final   GFR, Estimated 01/28/2022 >60  >60 mL/min Final   Comment: (NOTE) Calculated using the CKD-EPI Creatinine Equation (2021)    Anion gap 01/28/2022 11  5 - 15 Final   Performed at [facility], [place] [address], [place], [place] [address]   D-Dimer, Quant 01/28/2022 <0.27  0.00 - 0.50 ug/mL-FEU Final   Comment: (NOTE) At the manufacturer cut-off value of 0.5 g/mL FEU, this assay has a negative predictive value of 95-100%.This assay is intended for use in conjunction with a clinical pretest probability (PTP) assessment model to exclude pulmonary embolism (PE) and deep venous thrombosis (DVT) in outpatients suspected of PE or DVT. Results should be correlated with clinical presentation. Performed at [facility], [place] [address] [place], [place] [address]   Admission on 01/27/2022, Discharged on 01/27/2022  Component Date Value Ref Range Status   SARS Coronavirus 2 by RT PCR 01/27/2022 NEGATIVE  NEGATIVE Final   Comment: (NOTE) SARS-CoV-2 target nucleic acids are NOT DETECTED.  The SARS-CoV-2 RNA is generally detectable in upper respiratory specimens during the acute phase of infection. The lowest concentration of SARS-CoV-2 viral copies this assay can detect is 138 copies/mL. A negative result does not preclude SARS-Cov-2 infection and should not be used as the sole basis for treatment or other patient management decisions. A negative result may occur with  improper specimen collection/handling, submission of specimen other than nasopharyngeal swab, presence of viral mutation(s) within the areas targeted by this assay, and inadequate number of viral copies(<138 copies/mL). A negative result must be combined with clinical observations, patient history, and epidemiological information. The expected result is Negative.  Fact Sheet for Patients:  EntrepreneurPulse.com.au  Fact Sheet for Healthcare Providers:  IncredibleEmployment.be  This test is no                          t yet approved or cleared by the [place] FDA and  has been authorized for detection and/or diagnosis of SARS-CoV-2 by FDA under an Emergency Use Authorization (EUA). This EUA will remain  in effect (meaning this test can be used) for the duration of the COVID-19 declaration under Section 564(b)(1) of the Act, 21 U.S.C.section 360bbb-3(b)(1), unless the authorization is terminated  or revoked sooner.       Influenza A by PCR 01/27/2022 NEGATIVE  NEGATIVE Final   Influenza B by PCR 01/27/2022 NEGATIVE  NEGATIVE Final   Comment: (NOTE) The Xpert Xpress SARS-CoV-2/FLU/RSV plus assay is intended as an aid in the diagnosis of influenza from Nasopharyngeal swab  specimens and should not be used as a sole basis for treatment. Nasal  washings and aspirates are unacceptable for Xpert Xpress SARS-CoV-2/FLU/RSV testing.  Fact Sheet for Patients: EntrepreneurPulse.com.au  Fact Sheet for Healthcare Providers: IncredibleEmployment.be  This test is not yet approved or cleared by the [place] FDA and has been authorized for detection and/or diagnosis of SARS-CoV-2 by FDA under an Emergency Use Authorization (EUA). This EUA will remain in effect (meaning this test can be used) for the duration of the COVID-19 declaration under Section 564(b)(1) of the Act, 21 U.S.C. section 360bbb-3(b)(1), unless the authorization is terminated or revoked.  Performed at [facility], [place] [address], [place], [place] [address]    Sodium 01/27/2022 139  135 - 145 mmol/L Final   Potassium 01/27/2022 4.5  3.5 - 5.1 mmol/L Final   Chloride 01/27/2022 101  98 - 111 mmol/L Final   CO2 01/27/2022 24  22 - 32 mmol/L Final   Glucose, Bld 01/27/2022 91  70 - 99 mg/dL Final   Glucose reference range applies only to samples taken after fasting for at least 8 hours.   BUN 01/27/2022 8  6 - 20 mg/dL Final   Creatinine, Ser 01/27/2022 0.75  0.61 - 1.24 mg/dL Final   Calcium 01/27/2022 9.1  8.9 - 10.3 mg/dL Final   GFR, Estimated 01/27/2022 >60  >60 mL/min Final   Comment: (NOTE) Calculated using the CKD-EPI Creatinine Equation (2021)    Anion gap 01/27/2022 14  5 - 15 Final   Performed at [facility], [place] [address] [place], [place] [address]   WBC 01/27/2022 7.1  4.0 - 10.5 K/uL Final   RBC 01/27/2022 4.65  4.22 - 5.81 MIL/uL Final   Hemoglobin 01/27/2022 14.0  13.0 - 17.0 g/dL Final   HCT 01/27/2022 44.6  39.0 - 52.0 % Final   MCV 01/27/2022 95.9  80.0 - 100.0 fL Final   MCH 01/27/2022 30.1  26.0 - 34.0 pg Final   MCHC 01/27/2022 31.4  30.0 - 36.0 g/dL Final   RDW 01/27/2022 12.6  11.5 - 15.5 % Final   Platelets  01/27/2022 332  150 - 400 K/uL Final   nRBC 01/27/2022 0.0  0.0 - 0.2 % Final   Neutrophils Relative % 01/27/2022 44  % Final   Neutro Abs 01/27/2022 3.2  1.7 - 7.7 K/uL Final   Lymphocytes Relative 01/27/2022 39  % Final   Lymphs Abs 01/27/2022 2.7  0.7 - 4.0 K/uL Final   Monocytes Relative 01/27/2022 11  % Final   Monocytes Absolute 01/27/2022 0.8  0.1 - 1.0 K/uL Final   Eosinophils Relative 01/27/2022 5  % Final   Eosinophils Absolute 01/27/2022 0.4  0.0 - 0.5 K/uL Final   Basophils Relative 01/27/2022 1  % Final   Basophils Absolute 01/27/2022 0.0  0.0 - 0.1 K/uL Final   Immature Granulocytes 01/27/2022 0  % Final   Abs Immature Granulocytes 01/27/2022 0.01  0.00 - 0.07 K/uL Final   Performed at [facility], [place] [address] [place], [place] [address]  Admission on 12/01/2021, Discharged on 12/01/2021  Component Date Value Ref Range Status   Sodium 12/01/2021 139  135 - 145 mmol/L Final   Potassium 12/01/2021 3.3 (L)  3.5 - 5.1 mmol/L Final   Chloride 12/01/2021 104  98 - 111 mmol/L Final   CO2 12/01/2021 26  22 - 32 mmol/L Final   Glucose, Bld 12/01/2021 78  70 - 99 mg/dL Final   Glucose reference range applies only to samples taken after fasting for at  least 8 hours.   BUN 12/01/2021 11  6 - 20 mg/dL Final   Creatinine, Ser 12/01/2021 0.68  0.61 - 1.24 mg/dL Final   Calcium 12/01/2021 8.7 (L)  8.9 - 10.3 mg/dL Final   Total Protein 12/01/2021 6.6  6.5 - 8.1 g/dL Final   Albumin 12/01/2021 2.9 (L)  3.5 - 5.0 g/dL Final   AST 12/01/2021 15  15 - 41 U/L Final   ALT 12/01/2021 10  0 - 44 U/L Final   Alkaline Phosphatase 12/01/2021 61  38 - 126 U/L Final   Total Bilirubin 12/01/2021 0.4  0.3 - 1.2 mg/dL Final   GFR, Estimated 12/01/2021 >60  >60 mL/min Final   Comment: (NOTE) Calculated using the CKD-EPI Creatinine Equation (2021)    Anion gap 12/01/2021 9  5 - 15 Final   Performed at [facility] [address], [place], [place] [address]   WBC 12/01/2021 7.1  4.0  - 10.5 K/uL Final   RBC 12/01/2021 3.20 (L)  4.22 - 5.81 MIL/uL Final   Hemoglobin 12/01/2021 10.1 (L)  13.0 - 17.0 g/dL Final   HCT 12/01/2021 30.6 (L)  39.0 - 52.0 % Final   MCV 12/01/2021 95.6  80.0 - 100.0 fL Final   MCH 12/01/2021 31.6  26.0 - 34.0 pg Final   MCHC 12/01/2021 33.0  30.0 - 36.0 g/dL Final   RDW 12/01/2021 13.2  11.5 - 15.5 % Final   Platelets 12/01/2021 628 (H)  150 - 400 K/uL Final   nRBC 12/01/2021 0.0  0.0 - 0.2 % Final   Neutrophils Relative % 12/01/2021 61  % Final   Neutro Abs 12/01/2021 4.3  1.7 - 7.7 K/uL Final   Lymphocytes Relative 12/01/2021 25  % Final   Lymphs Abs 12/01/2021 1.8  0.7 - 4.0 K/uL Final   Monocytes Relative 12/01/2021 11  % Final   Monocytes Absolute 12/01/2021 0.8  0.1 - 1.0 K/uL Final   Eosinophils Relative 12/01/2021 2  % Final   Eosinophils Absolute 12/01/2021 0.2  0.0 - 0.5 K/uL Final   Basophils Relative 12/01/2021 1  % Final   Basophils Absolute 12/01/2021 0.0  0.0 - 0.1 K/uL Final   Immature Granulocytes 12/01/2021 0  % Final   Abs Immature Granulocytes 12/01/2021 0.03  0.00 - 0.07 K/uL Final   Performed at [facility], [place] [address], [place], [place] [address]   Lipase 12/01/2021 29  11 - 51 U/L Final   Performed at [facility] [address], [place], [place] [address]  No results displayed because visit has over 200 results.    Admission on 09/02/2021, Discharged on 09/03/2021  Component Date Value Ref Range Status   WBC 09/02/2021 3.0 (L)  4.0 - 10.5 K/uL Final   RBC 09/02/2021 4.98  4.22 - 5.81 MIL/uL Final   Hemoglobin 09/02/2021 15.6  13.0 - 17.0 g/dL Final   HCT 09/02/2021 47.4  39.0 - 52.0 % Final   MCV 09/02/2021 95.2  80.0 - 100.0 fL Final   MCH 09/02/2021 31.3  26.0 - 34.0 pg Final   MCHC 09/02/2021 32.9  30.0 - 36.0 g/dL Final   RDW 09/02/2021 12.3  11.5 - 15.5 % Final   Platelets 09/02/2021 314  150 - 400 K/uL Final   nRBC 09/02/2021 0.0  0.0 - 0.2 % Final   Neutrophils Relative % 09/02/2021 24   % Final   Neutro Abs 09/02/2021 0.7 (L)  1.7 - 7.7 K/uL  Final   Lymphocytes Relative 09/02/2021 54  % Final   Lymphs Abs 09/02/2021 1.6  0.7 - 4.0 K/uL Final   Monocytes Relative 09/02/2021 13  % Final   Monocytes Absolute 09/02/2021 0.4  0.1 - 1.0 K/uL Final   Eosinophils Relative 09/02/2021 3  % Final   Eosinophils Absolute 09/02/2021 0.1  0.0 - 0.5 K/uL Final   Basophils Relative 09/02/2021 5  % Final   Basophils Absolute 09/02/2021 0.2 (H)  0.0 - 0.1 K/uL Final   nRBC 09/02/2021 0  0 /100 WBC Final   Myelocytes 09/02/2021 1  % Final   Abs Immature Granulocytes 09/02/2021 0.00  0.00 - 0.07 K/uL Final   Performed at [facility], [place] [address], [place], [place] [address]   Sodium 09/02/2021 139  135 - 145 mmol/L Final   Potassium 09/02/2021 4.5  3.5 - 5.1 mmol/L Final   Chloride 09/02/2021 102  98 - 111 mmol/L Final   CO2 09/02/2021 24  22 - 32 mmol/L Final   Glucose, Bld 09/02/2021 92  70 - 99 mg/dL Final   Glucose reference range applies only to samples taken after fasting for at least 8 hours.   BUN 09/02/2021 22 (H)  6 - 20 mg/dL Final   Creatinine, Ser 09/02/2021 0.77  0.61 - 1.24 mg/dL Final   Calcium 09/02/2021 8.8 (L)  8.9 - 10.3 mg/dL Final   Total Protein 09/02/2021 6.5  6.5 - 8.1 g/dL Final   Albumin 09/02/2021 3.5  3.5 - 5.0 g/dL Final   AST 09/02/2021 33  15 - 41 U/L Final   ALT 09/02/2021 20  0 - 44 U/L Final   Alkaline Phosphatase 09/02/2021 81  38 - 126 U/L Final   Total Bilirubin 09/02/2021 0.6  0.3 - 1.2 mg/dL Final   GFR, Estimated 09/02/2021 >60  >60 mL/min Final   Comment: (NOTE) Calculated using the CKD-EPI Creatinine Equation (2021)    Anion gap 09/02/2021 13  5 - 15 Final   Performed at [facility], [place] [address] [place], [place] [address]   Alcohol, Ethyl (B) 09/02/2021 <10  <10 mg/dL Final   Comment: (NOTE) Lowest detectable limit for serum alcohol is 10 mg/dL.  For medical purposes only. Performed at [facility], [place] [address] [place], [place] [address]    CD4 T Cell Abs 09/02/2021 568  400 - 1,790 /uL Final   CD4 % Helper T Cell 09/02/2021 35  33 - 65 % Final   Performed at [facility], [place] [address] [place], [name] [address]   Path Review 09/02/2021 Severe neutropenia. Negative for blasts and myelodysplastic features.   Final   Comment: Reviewed by Reesa Chew. Picklesimer, M.D. 09/03/2021 Performed at [facility], [place] [address], [place], [place] [address]   Admission on 09/02/2021, Discharged on 09/02/2021  Component Date Value Ref Range Status   SARS Coronavirus 2 by RT PCR 09/02/2021 POSITIVE (A)  NEGATIVE Final   Comment: (NOTE) SARS-CoV-2 target nucleic acids are DETECTED.  The SARS-CoV-2 RNA is generally detectable in upper respiratory specimens during the acute phase of infection. Positive results are indicative of the presence of the identified virus, but do not rule out bacterial infection or co-infection with other pathogens not detected by the test. Clinical correlation with patient history and other diagnostic information is necessary to determine patient infection status. The expected result is Negative.  Fact Sheet for Patients: EntrepreneurPulse.com.au  Fact Sheet for  Healthcare Providers: IncredibleEmployment.be  This test is not yet approved or cleared by the [place] and  has been authorized for detection and/or diagnosis of SARS-CoV-2 by FDA under an Emergency Use Authorization (EUA).  This EUA will remain in effect (meaning this test can be used) for the duration of  the COVID-19 declaration under Section 564(b)(1) of the A                          ct, 21 U.S.C. section 360bbb-3(b)(1), unless the authorization is terminated or revoked sooner.     Influenza A by PCR 09/02/2021 NEGATIVE  NEGATIVE Final   Influenza B by PCR 09/02/2021 NEGATIVE  NEGATIVE Final   Comment: (NOTE) The Xpert Xpress  SARS-CoV-2/FLU/RSV plus assay is intended as an aid in the diagnosis of influenza from Nasopharyngeal swab specimens and should not be used as a sole basis for treatment. Nasal washings and aspirates are unacceptable for Xpert Xpress SARS-CoV-2/FLU/RSV testing.  Fact Sheet for Patients: EntrepreneurPulse.com.au  Fact Sheet for Healthcare Providers: IncredibleEmployment.be  This test is not yet approved or cleared by the [place] FDA and has been authorized for detection and/or diagnosis of SARS-CoV-2 by FDA under an Emergency Use Authorization (EUA). This EUA will remain in effect (meaning this test can be used) for the duration of the COVID-19 declaration under Section 564(b)(1) of the Act, 21 U.S.C. section 360bbb-3(b)(1), unless the authorization is terminated or revoked.  Performed at [facility], [place] [address] [place], [place] [address]    SARSCOV2ONAVIRUS 2 AG 09/02/2021 POSITIVE (A)  NEGATIVE Final   Comment: (NOTE) SARS-CoV-2 antigen PRESENT.  Positive results indicate the presence of viral antigens, but clinical correlation with patient history and other diagnostic information is necessary to determine patient infection status.  Positive results do not rule out bacterial infection or co-infection  with other viruses. False positive results are rare but can occur, and confirmatory RT-PCR testing may be appropriate in some circumstances. The expected result is Negative.  Fact Sheet for Patients: HandmadeRecipes.com.cy  Fact Sheet for Healthcare Providers: FuneralLife.at  This test is not yet approved or cleared by the [place] FDA and  has been authorized for detection and/or diagnosis of SARS-CoV-2 by FDA under an Emergency Use Authorization (EUA).  This EUA will remain in effect (meaning this test can be used) for the duration of  the COVID-19 declaration under  Section 564(b)(1) of the Act, 21 U.S.C. section [phone](                          b)(1), unless the authorization is terminated or revoked sooner.      Blood Alcohol level:  Lab Results  Component Value Date   ETH <10 02/25/2022   ETH <10 30/16/0109    Metabolic Disorder Labs: Lab Results  Component Value Date   HGBA1C 5.7 (H) 02/25/2022   MPG 117 02/25/2022   MPG 102.54 12/23/2020   Lab Results  Component Value Date   PROLACTIN 6.7 09/29/2012   Lab Results  Component Value Date   CHOL 152 02/25/2022   TRIG 76 02/25/2022   HDL 54 02/25/2022   CHOLHDL 2.8 02/25/2022   VLDL 15 02/25/2022   LDLCALC 83 02/25/2022   LDLCALC 73 03/29/2021    Therapeutic Lab Levels: No results found for: "LITHIUM" Lab Results  Component Value Date   VALPROATE 59 04/06/2015   VALPROATE 95 11/18/2014  No results found for: "CBMZ"  Physical Findings   AIMS    Flowsheet Row Admission (Discharged) from 09/17/2020 in [facility] Admission (Discharged) from 03/23/2019 in [facility] Admission (Discharged) from 03/31/2015 in [facility] Admission (Discharged) from 11/14/2014 in [facility] Total Score 0 0 0 0      AUDIT    Flowsheet Row Admission (Discharged) from 09/17/2020 in [facility] Counselor from 05/22/2020 in [facility] Admission (Discharged) from 03/23/2019 in [facility] Admission (Discharged) from 03/31/2015 in [facility] Admission (Discharged) from 11/14/2014 in [facility]  Alcohol Use Disorder Identification Test Final Score (AUDIT) 31 32 '28 12 12      '$ CAGE-AID    Flowsheet Row ED to Hosp-Admission (Discharged) from 08/07/2021 in [facility] Score 2      GAD-7     Flowsheet Row Office Visit from 11/24/2020 in [facility] Office Visit from 10/03/2020 in [facility] Office Visit from 08/01/2020 in [facility] Office Visit from 05/22/2020 in [facility]  Total GAD-7 Score '16 18 9 19      '$ PHQ2-9    [place] ED from 02/25/2022 in [facility] Office Visit from 02/10/2021 in [facility] Office Visit from 11/24/2020 in [facility] Office Visit from 10/27/2020 in [facility] Office Visit from 10/03/2020 in [facility]  PHQ-2 Total Score 4 0 5 0 4  PHQ-9 Total Score 25 -- 18 -- [place] [facility] from 02/25/2022 in [facility] ED from 02/11/2022 in [facility] ED from 01/28/2022 in [facility] High Risk No Risk No Risk        Musculoskeletal  Strength & Muscle Tone: within normal limits Gait & Station: normal Patient leans: N/A  Psychiatric Specialty Exam  Presentation  General Appearance:  Appropriate for Environment; Disheveled  Eye Contact: Minimal  Speech: Clear and Coherent; Normal Rate  Speech Volume: Increased  Handedness: Right   Mood and Affect  Mood: Irritable; Labile; Anxious  Affect: Labile   Thought Process  Thought Processes: Coherent; Goal Directed; Linear  Descriptions of Associations:Intact  Orientation:Full (Time, Place and Person)  Thought Content:Paranoid Ideation  Diagnosis of Schizophrenia or Schizoaffective disorder in past: Yes  Duration of Psychotic Symptoms: Greater than six months   Hallucinations:Hallucinations: Auditory; Visual Description of Auditory Hallucinations: voices telling him to  kill himself and other people Description of Visual Hallucinations: pink elephants, yellow donkeys  Ideas of Reference:Paranoia  Suicidal Thoughts:Suicidal Thoughts: Yes, Passive SI Passive Intent and/or Plan: With Intent; With Plan; With Means to Carry Out  Homicidal Thoughts:Homicidal Thoughts: Yes, Passive   Sensorium  Memory: Immediate Fair; Recent Fair; Remote Fair  Judgment: Impaired  Insight: Poor   Executive Functions  Concentration: Fair  Attention Span: Fair  Recall: [employer] of Knowledge: Fair  Language: Fair   Psychomotor Activity  Psychomotor Activity: Psychomotor Activity: Increased   Assets  Assets: Communication Skills; Desire for Improvement; Leisure Time; Social Support   Sleep  Sleep: Sleep: Fair   No  data recorded  Physical Exam  Physical Exam Constitutional:      Appearance: Normal appearance.  HENT:     Head: Normocephalic and atraumatic.  Cardiovascular:     Rate and Rhythm: Normal rate.  Pulmonary:     Effort: Pulmonary effort is normal.  Musculoskeletal:        General: Normal range of motion.  Skin:    General: Skin is warm and dry.  Neurological:     General: No focal deficit present.     Mental Status: He is alert.    Review of Systems  Respiratory:  Negative for shortness of breath.   Cardiovascular:  Negative for chest pain.  Gastrointestinal:  Negative for abdominal pain, constipation, diarrhea, heartburn, nausea and vomiting.  Neurological:  Negative for headaches.   Blood pressure 113/74, pulse 95, temperature (!) 97.5 F (36.4 C), temperature source Oral, resp. rate 18, SpO2 100 %. There is no height or weight on file to calculate BMI.  Treatment Plan Summary: Daily contact with patient to assess and evaluate symptoms and progress in treatment and Medication management  -Unable to contract for safety so will recommend inpatient psych  -Continue seroquel 100 mg qhs for depressive symptoms and  hallucinations -Continue buspar 5 mg bid for anxiety -Restart biktarvy for HIV  France Ravens, MD 02/26/2022 8:35 AM

## 2022-02-26 NOTE — Discharge Instructions (Signed)
You are being transferred to [facility] for higher level of care

## 2022-02-26 NOTE — Progress Notes (Signed)
Adult Psychoeducational Group Note  Date:  02/26/2022 Time:  8:43 PM  Group Topic/Focus:  Wrap-Up Group:   The focus of this group is to help patients review their daily goal of treatment and discuss progress on daily workbooks.  Participation Level:  Active  Participation Quality:  Appropriate  Affect:  Anxious and Defensive  Cognitive:  Disorganized  Insight: Limited  Engagement in Group:  Limited  Modes of Intervention:  Discussion  Additional Comments: Pt is new admission. Pt stated his goal for today was to focus on his treatment plan. Pt stated he accomplished his goal today. Pt stated he did not get a chance to talked with his doctor or with his social worker about his care today. Pt rated his overall day a 2 out of 10. Pt stated he made no calls today.   Pt stated his appetite was fair today. Pt rated sleep last night was poor. Pt stated the goal tonight was to get some rest. Pt stated he had no physical pain tonight. Pt deny visual hallucinations and auditory issues tonight. Pt denies thoughts of harming himself or others. Pt stated he would alert staff if anything changed  Candy Sledge 02/26/2022, 8:43 PM

## 2022-02-27 DIAGNOSIS — F333 Major depressive disorder, recurrent, severe with psychotic symptoms: Secondary | ICD-10-CM | POA: Diagnosis not present

## 2022-02-27 LAB — HIV-1 RNA QUANT-NO REFLEX-BLD
HIV 1 RNA Quant: 1030 copies/mL
LOG10 HIV-1 RNA: 3.013 log10copy/mL

## 2022-02-27 MED ORDER — GABAPENTIN 100 MG PO CAPS
200.0000 mg | ORAL_CAPSULE | Freq: Three times a day (TID) | ORAL | Status: DC
  Administered 2022-02-27 – 2022-03-06 (×22): 200 mg via ORAL
  Filled 2022-02-27: qty 60
  Filled 2022-02-27 (×9): qty 2
  Filled 2022-02-27: qty 60
  Filled 2022-02-27 (×6): qty 2
  Filled 2022-02-27: qty 60
  Filled 2022-02-27 (×9): qty 2

## 2022-02-27 MED ORDER — QUETIAPINE FUMARATE 100 MG PO TABS
100.0000 mg | ORAL_TABLET | Freq: Every day | ORAL | Status: DC
  Filled 2022-02-27 (×2): qty 1

## 2022-02-27 MED ORDER — QUETIAPINE FUMARATE 200 MG PO TABS
200.0000 mg | ORAL_TABLET | Freq: Every day | ORAL | Status: DC
  Administered 2022-02-27 – 2022-02-28 (×2): 200 mg via ORAL
  Filled 2022-02-27 (×4): qty 1

## 2022-02-27 NOTE — H&P (Addendum)
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  Respiratory:  Negative for shortness of breath.   Cardiovascular:  Negative for chest pain.  Gastrointestinal:  Negative for abdominal pain, constipation, diarrhea, heartburn, nausea and vomiting.  Neurological:  Negative for headaches.   Blood pressure (!) 139/92, pulse 88, temperature 98.5 F (36.9 C), temperature  source Oral, resp. rate 18, height '5\' 9"'$  (1.753 m), weight 68 kg, SpO2 97 %. Body mass index is 22.15 kg/m.  Assessment Jeremiyah Cullens is a 51 yo male w/ hx of MDD, cocaine use disorder, alcohol use disorder, cannabis use disorder, GAD with panic attacks, PTSD, tobacco use disorder, HIV, asthma, homelessness presenting voluntarily from [facility] to [facility] for active suicidal ideation and psychosis.   He does not meet criteria for bipolar disorder or schizophrenia. His mood/psychotic symptoms seems to based in his cocaine and marijuana use. Will use seroquel for its antidepressant and antipsychotic properties and gabapentin rather than buspar for anxiety and neuropathic pain.  PLAN Safety and Monitoring: voluntarily admission to inpatient psychiatric unit for safety, stabilization and treatment Daily contact with patient to assess and evaluate symptoms and progress in treatment Appropriate medication management to further stabilize patient Patient's case will be regularly discussed in multi-disciplinary team meeting Observation Level : q15 minute checks Vital signs: q12 hours Precautions: suicide, elopement, and assault  2. 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 The patient consents to medication trial.  -- Metabolic profile and EKG monitoring obtained while on an atypical antipsychotic (BMI: Body mass index is 22.15 kg/m. Lipid Panel: wnl HbgA1c: 5.7 QTc: 401) -- Encouraged patient to participate in unit milieu and  in scheduled group therapies   3. Medical Management HIV -Continue Biktarvy -CD4/HIV quant ordered  Asthma -Albuterol prn  PRN The following PRN medications were added to ensure patient can focus on treatment. These were discussed with patient and patient aware of  ability to ask for the following medications:  -Tylenol 650 mg q6hr PRN for mild pain -Mylanta 30 ml suspension for indigestion -Milk of Magnesia 30 ml for constipation -Trazodone 50 mg qhs for insomnia -Hydroxyzine 25 mg tid PRN for anxiety  4. Discharge Planning Patient will require the following based on my assessment:  Greatly appreciate CSW and Case management assistance with facilitating these needs and any further recommendations regarding patient's needs upon discharge. Estimated LOS: 4-7 days Discharge Concerns: Need to establish a safety plan; Medication compliance and effectiveness Discharge Goals: Return home with outpatient referrals for mental health follow-up including medication management/psychotherapy   Long Term Goal(s): Minimizing disruption current psychiatric diagnosis is causing so that patient can be safely discharged Short Term Goals: Compliance with proposed treatment plan and adjusting to psychiatric unit and peers.  I certify that inpatient services furnished can reasonably be expected to improve the patient's condition.     France Ravens, MD PGY2 Psychiatry Resident 02/27/2022 8:54 AM

## 2022-02-27 NOTE — Group Note (Signed)
LCSW Group Therapy Note  02/27/2022      Type of Therapy and Topic:  Group Therapy: Gratitude   Description:   Group could not be held by Education officer, museum, but Airline pilot did provide group.  A handout was given to each patient, with the following information:   Gratitude  "Acknowledging the good that you already have in your life is the foundation for all abundance." - Betha Loa  " 'Enough' is a feast." - Buddhist Proverb  "Gratitude sweetens even the smallest moments."  "It is not joy that makes [name] grateful; It is gratitude that makes [name] joyful." - Elroy Channel    Put at least one response under each category of something for which you are grateful:  People:  Experiences:  Things:  Places:  Skills:  Other:  Add more responses as you get ideas from other people.   Therapeutic Modalities:   Activity  Maretta Los, LCSW

## 2022-02-27 NOTE — Progress Notes (Addendum)
Pt is A&OX4, calm, isolative, guarded, denies suicidal ideations, admits to homicidal ideations without a plan  ("Anybody!  Blow up the world!), denies auditory hallucinations and denies visual hallucinations. Pt verbally agrees to approach staff if these become apparent and before harming self or others. Pt denies experiencing nightmares. Mood and affect are congruent. Pt appetite is good. No complaints of anxiety, distress, pain and/or discomfort at this time. Pt's memory appears to be grossly intact, and Pt hasn't displayed any injurious behaviors. Pt is medication compliant. There's no evidence of suicidal intent. Psychomotor activity was WNL. No s/s of Parkinson, Dystonia, Akathisia and/or Tardive Dyskinesia noted. Pt c/o diarrhea, possibly r/t him not being accustom to eating full meals.

## 2022-02-27 NOTE — BHH Group Notes (Signed)
Goals Group 02/27/22   Group Focus: affirmation, clarity of thought, and goals/reality orientation Treatment Modality:  Psychoeducation Interventions utilized were assignment, group exercise, and support Purpose: To be able to understand and verbalize the reason for their admission to the hospital. To understand that the medication helps with their chemical imbalance but they also need to work on their choices in life. To be challenged to develop a list of 30 positives about themselves. Also introduce the concept that "feelings" are not reality.  Participation Level:  did not attend  Paulino Rily
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
## 2022-02-27 NOTE — Progress Notes (Signed)
   02/27/22 0530  Sleep  Number of Hours 8

## 2022-02-28 DIAGNOSIS — F333 Major depressive disorder, recurrent, severe with psychotic symptoms: Secondary | ICD-10-CM | POA: Diagnosis not present

## 2022-02-28 LAB — C DIFFICILE QUICK SCREEN W PCR REFLEX
C Diff antigen: NEGATIVE
C Diff interpretation: NOT DETECTED
C Diff toxin: NEGATIVE

## 2022-02-28 MED ORDER — PANTOPRAZOLE SODIUM 40 MG PO TBEC
40.0000 mg | DELAYED_RELEASE_TABLET | Freq: Every day | ORAL | Status: DC
  Administered 2022-02-28 – 2022-03-06 (×7): 40 mg via ORAL
  Filled 2022-02-28 (×2): qty 1
  Filled 2022-02-28: qty 10
  Filled 2022-02-28 (×6): qty 1

## 2022-02-28 MED ORDER — SIMETHICONE 80 MG PO CHEW
80.0000 mg | CHEWABLE_TABLET | Freq: Four times a day (QID) | ORAL | Status: DC | PRN
  Administered 2022-03-01: 80 mg via ORAL
  Filled 2022-02-28: qty 1

## 2022-02-28 MED ORDER — ONDANSETRON 4 MG PO TBDP: 4.0000 mg | ORAL_TABLET | Freq: Three times a day (TID) | ORAL | Status: DC | PRN

## 2022-02-28 MED ORDER — SIMETHICONE 80 MG PO CHEW
80.0000 mg | CHEWABLE_TABLET | Freq: Once | ORAL | Status: AC
  Administered 2022-02-28: 80 mg via ORAL
  Filled 2022-02-28: qty 1

## 2022-02-28 NOTE — Progress Notes (Signed)
Patient reports he has had five loose watery stools today. Gatorade and water given and encouraged pt to drink.

## 2022-02-28 NOTE — Progress Notes (Signed)
   02/28/22 1700  Psych Admission Type (Psych Patients Only)  Admission Status Voluntary  Psychosocial Assessment  Patient Complaints Anxiety;Depression  Eye Contact Fair  Facial Expression Flat;Sad  Affect Depressed  Speech Logical/coherent  Interaction Assertive  Motor Activity Slow  Appearance/Hygiene Disheveled  Behavior Characteristics Cooperative;Appropriate to situation  Mood Depressed;Anxious  Thought Process  Coherency WDL  Content WDL  Delusions None reported or observed  Perception WDL  Hallucination None reported or observed  Judgment Poor  Confusion None  Danger to Self  Current suicidal ideation? Denies  Self-Injurious Behavior No self-injurious ideation or behavior indicators observed or expressed   Agreement Not to Harm Self Yes  Description of Agreement Verbal  Danger to Others  Danger to Others None reported or observed

## 2022-02-28 NOTE — Progress Notes (Signed)
Pt was scheduled for a lab draw this evening, patient remains on enteric precautions and is unable to leave his room, phlebotomist stated they would try again tomorrow.

## 2022-02-28 NOTE — Group Note (Signed)
[place] LCSW Group Therapy Note  02/28/2022  10:00-11:00AM  Type of Therapy and Topic:  Group Therapy:  Adding Supports Including Myself  Participation Level:  Did Not Attend   Description of Group:  Patients in this group were introduced to the differences between healthy supports and unheathy supports.  This led to a lengthy and emotional discussion about how to set boundaries, particularly with family members.  Many in group expressed that they put other people before themselves.  The group discussed that this not only hurts them, but ultimately ends up hurting the people they want to help.  Examples were given about how to set boundaries one at a time rather than merely cutting people off.  A song entitled "My Own Hero" was played.  A group discussion ensued in which patients stated they could relate to the song and it inspired them to realize they have be willing to help themselves in order to succeed, because other people cannot achieve sobriety or stability for them.  We discussed adding a variety of healthy supports to address the various needs in our lives.    Therapeutic Goals: 1)  demonstrate the importance of being a part of one's own support system by asking for and accepting help 2)  discuss reasons people in one's life may eventually be unable to be continually supportive  3)  identify the patient's current support system and   4)  elicit commitments to add healthy supports and to become more conscious of being self-supportive   Summary of Patient Progress:  N/A  Therapeutic Modalities:   Motivational Interviewing Activity  Maretta Los

## 2022-02-28 NOTE — Progress Notes (Signed)
Adult Psychoeducational Group Note  Date:  02/28/2022 Time:  12:30 AM  Group Topic/Focus:  Wrap-Up Group:   The focus of this group is to help patients review their daily goal of treatment and discuss progress on daily workbooks.  Participation Level:  Did Not Attend  Participation Quality:   Did Not Attend  Affect:   Did Not Attend  Cognitive:   Did Not Attend   Insight: None  Engagement in Group:   Did Not Attend  Modes of Intervention:   Did Not Attend  Additional Comments:  Pt was encouraged to attend wrap up group but did not attend  Candy Sledge 02/28/2022, 12:30 AM

## 2022-02-28 NOTE — Progress Notes (Signed)
Stool specimen sent to [facility] lab as ordered. Stool was very watery and foul smelling. Pt reports he has continued with watery stools all day.
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
## 2022-02-28 NOTE — BHH Group Notes (Signed)
Adult Psychoeducational Group Note Date:  02/28/2022 Time:  0900-1000 Group Topic/Focus: PROGRESSIVE RELAXATION. A group where deep breathing is taught and tensing and relaxation muscle groups is used. Imagery is used as well.  Pts are asked to imagine 3 pillars that hold them up when they are not able to hold themselves up and to share that with the group.   Participation Level:  did not atttend   : John [name]

## 2022-02-28 NOTE — Progress Notes (Signed)
Adult Psychoeducational Group Note  Date:  02/28/2022 Time:  9:01 PM  Group Topic/Focus:  Wrap-Up Group:   The focus of this group is to help patients review their daily goal of treatment and discuss progress on daily workbooks.  Participation Level:  Did Not Attend  Participation Quality:   Did Not Attend  Affect:   Did Not Attend  Cognitive:   Did Not Attend  Insight: None  Engagement in Group:   Did not Attend  Modes of Intervention:   Did Not Attend  Additional Comments:    Pt was encouraged to attend wrap up group but did not attend   Candy Sledge 02/28/2022, 9:01 PM

## 2022-02-28 NOTE — Progress Notes (Signed)
   02/28/22 0000  Psych Admission Type (Psych Patients Only)  Admission Status Voluntary  Psychosocial Assessment  Patient Complaints Loneliness;Depression;Anxiety  Eye Contact Fair  Facial Expression Animated  Affect Appropriate to circumstance  Speech Logical/coherent  Interaction Assertive  Motor Activity Slow  Appearance/Hygiene Unremarkable  Behavior Characteristics Cooperative;Appropriate to situation  Mood Depressed;Anxious  Thought Process  Coherency WDL  Content WDL  Delusions None reported or observed  Perception WDL  Hallucination None reported or observed  Judgment Poor  Confusion None  Danger to Self  Current suicidal ideation? Denies  Self-Injurious Behavior No self-injurious ideation or behavior indicators observed or expressed   Agreement Not to Harm Self Yes  Description of Agreement verbal  Danger to Others  Danger to Others None reported or observed

## 2022-03-01 ENCOUNTER — Encounter (HOSPITAL_COMMUNITY): Payer: Self-pay

## 2022-03-01 DIAGNOSIS — F333 Major depressive disorder, recurrent, severe with psychotic symptoms: Secondary | ICD-10-CM | POA: Diagnosis not present

## 2022-03-01 LAB — CD4/CD8 (T-HELPER/T-SUPPRESSOR CELL)
CD4 absolute: 385 /uL — ABNORMAL LOW (ref 400–1790)
CD4%: 25.8 % — ABNORMAL LOW (ref 33–65)
CD8 T Cell Abs: 721 /uL (ref 190–1000)
CD8tox: 48.32 % — ABNORMAL HIGH (ref 12–40)
Ratio: 0.53 — ABNORMAL LOW (ref 1.0–3.0)
Total lymphocyte count: 1492 /uL (ref 1000–4000)

## 2022-03-01 LAB — GASTROINTESTINAL PANEL BY PCR, STOOL (REPLACES STOOL CULTURE)
Adenovirus F40/41: DETECTED — AB
Astrovirus: NOT DETECTED
Campylobacter species: NOT DETECTED
Cryptosporidium: NOT DETECTED
Cyclospora cayetanensis: NOT DETECTED
Entamoeba histolytica: NOT DETECTED
Enteroaggregative E coli (EAEC): NOT DETECTED
Enteropathogenic E coli (EPEC): NOT DETECTED
Enterotoxigenic E coli (ETEC): NOT DETECTED
Giardia lamblia: NOT DETECTED
Norovirus GI/GII: DETECTED — AB
Plesimonas shigelloides: NOT DETECTED
Rotavirus A: NOT DETECTED
Salmonella species: NOT DETECTED
Sapovirus (I, II, IV, and V): NOT DETECTED
Shiga like toxin producing E coli (STEC): NOT DETECTED
Shigella/Enteroinvasive E coli (EIEC): NOT DETECTED
Vibrio cholerae: NOT DETECTED
Vibrio species: NOT DETECTED
Yersinia enterocolitica: NOT DETECTED

## 2022-03-01 LAB — COMPREHENSIVE METABOLIC PANEL
ALT: 15 U/L (ref 0–44)
AST: 16 U/L (ref 15–41)
Albumin: 3.6 g/dL (ref 3.5–5.0)
Alkaline Phosphatase: 83 U/L (ref 38–126)
Anion gap: 7 (ref 5–15)
BUN: 17 mg/dL (ref 6–20)
CO2: 23 mmol/L (ref 22–32)
Calcium: 9 mg/dL (ref 8.9–10.3)
Chloride: 108 mmol/L (ref 98–111)
Creatinine, Ser: 0.79 mg/dL (ref 0.61–1.24)
GFR, Estimated: >60 mL/min (ref >60)
Glucose, Bld: 115 mg/dL — ABNORMAL HIGH (ref 70–99)
Potassium: 4 mmol/L (ref 3.5–5.1)
Sodium: 138 mmol/L (ref 135–145)
Total Bilirubin: 0.4 mg/dL (ref 0.3–1.2)
Total Protein: 7.6 g/dL (ref 6.5–8.1)

## 2022-03-01 LAB — CBC
HCT: 42.3 % (ref 39.0–52.0)
Hemoglobin: 13.4 g/dL (ref 13.0–17.0)
MCH: 29.2 pg (ref 26.0–34.0)
MCHC: 31.7 g/dL (ref 30.0–36.0)
MCV: 92.2 fL (ref 80.0–100.0)
Platelets: 401 10*3/uL — ABNORMAL HIGH (ref 150–400)
RBC: 4.59 MIL/uL (ref 4.22–5.81)
RDW: 13.3 % (ref 11.5–15.5)
WBC: 5.9 10*3/uL (ref 4.0–10.5)
nRBC: 0 % (ref 0.0–0.2)

## 2022-03-01 LAB — LIPASE, BLOOD: Lipase: 26 U/L (ref 11–51)

## 2022-03-01 MED ORDER — QUETIAPINE FUMARATE ER 300 MG PO TB24
300.0000 mg | ORAL_TABLET | Freq: Every day | ORAL | Status: DC
  Administered 2022-03-01 – 2022-03-05 (×5): 300 mg via ORAL
  Filled 2022-03-01 (×7): qty 1

## 2022-03-01 MED ORDER — LOPERAMIDE HCL 2 MG PO CAPS
2.0000 mg | ORAL_CAPSULE | ORAL | Status: DC | PRN
  Administered 2022-03-01 – 2022-03-03 (×6): 2 mg via ORAL
  Filled 2022-03-01 (×6): qty 1

## 2022-03-01 NOTE — Progress Notes (Signed)
Pt is on room isolation due to viral precautions

## 2022-03-01 NOTE — Group Note (Signed)
Recreation Therapy Group Note   Group Topic:Coping Skills  Group Date: 03/01/2022 Start Time: 0347 End Time: 1048 Facilitators: Alveena Taira-McCall, LRT,CTRS Location: [address] Dayroom   Goal Area(s) Addresses: Patient will define what a coping skill is. Patient will work with peer to create a list of healthy coping skills beginning with each letter of the alphabet. Patient will successfully identify positive coping skills they can use post d/c.  Patient will acknowledge benefit(s) of using learned coping skills post d/c.  Group Description: Coping A to Z. Patient asked to identify what a coping skill is and when they use them. Patients with Probation officer discussed healthy versus unhealthy coping skills. Next patients were given a blank worksheet titled "Coping Skills A-Z".  Patients were instructed to come up with at least one positive coping skill per letter of the alphabet, addressing a specific challenge (ex: stress, anger, anxiety, depression, grief, doubt, isolation, self-harm/suicidal thoughts, substance use). Patients were given 15 minutes to brainstorm before ideas were presented to the large group. Patients and LRT debriefed on the importance of coping skill selection based on situation and back-up plans when a skill tried is not effective. At the end of group, patients were given an handout of alphabetized strategies to keep for future reference.    Affect/Mood: N/A   Participation Level: Did not attend    Clinical Observations/Individualized Feedback:      Plan: Continue to engage patient in RT group sessions 2-3x/week.   Breshay Ilg-McCall, LRT,CTRS 03/01/2022 12:26 PM

## 2022-03-01 NOTE — Progress Notes (Signed)
Pt complained of stomach ache and loose stool. Pt given tylenol and simethicone with some relief. Pt stated this has been going on for some time now. Pt remains on contact precaution, will continue to monitor.

## 2022-03-01 NOTE — Group Note (Signed)
LCSW Group Therapy Note   Group Date: 03/01/2022 Start Time: 1300 End Time: 1400   Type of Therapy and Topic:  Group Therapy:  Setting Goals   Participation Level:  Did not attend    Description of Group: In this process group, patients discussed using strengths to work toward goals and address challenges.  Patients identified two positive things about themselves and one goal they were working on.  Patients were given the opportunity to share openly and support each other's plan for self-empowerment.  The group discussed the value of gratitude and were encouraged to have a daily reflection of positive characteristics or circumstances.  Patients were encouraged to identify a plan to utilize their strengths to work on current challenges and goals.   Therapeutic Goals Patient will verbalize personal strengths/positive qualities and relate how these can assist with achieving desired personal goals Patients will verbalize affirmation of peers plans for personal change and goal setting Patients will explore the value of gratitude and positive focus as related to successful achievement of goals Patients will verbalize a plan for regular reinforcement of personal positive qualities and circumstances.   Summary of Patient Progress: Did not attend       Therapeutic Modalities Cognitive Behavioral Therapy Motivational Interviewing      Sherre Lain, [place] 03/01/2022  2:05 PM

## 2022-03-01 NOTE — [facility] Group Notes (Signed)
Adult Psychoeducational Group Note  Date:  03/01/2022 Time:  10:02 AM  Group Topic/Focus:  Goals Group:   The focus of this group is to help patients establish daily goals to achieve during treatment and discuss how the patient can incorporate goal setting into their daily lives to aide in recovery.  Participation Level:  Did Not Attend    Dub Mikes 03/01/2022, 10:02 AM

## 2022-03-01 NOTE — [facility] Suicide Risk Assessment (Signed)
[place] INPATIENT:  Family/Significant Other Suicide Prevention Education  Suicide Prevention Education:  Education Completed; [name] [phone], has been identified by the patient as the family member/significant other with whom the patient will be residing, and identified as the person(s) who will aid the patient in the event of a mental health crisis (suicidal ideations/suicide attempt).  With written consent from the patient, the family member/significant other has been provided the following suicide prevention education, prior to the and/or following the discharge of the patient.  CSW reached out to patient aunt today to complete his safety planning. Aunt states that several years ago, she took patient under her wings and provided him a home since he did not have any family support. Aunt became patient direct payee and every Friday he would call her to get his money. However, once aunt sold her house and moved in with her fiance patient stopped living with her. Aunt states that patient would not tell her where he was living but if she could guess, " the streets" . Aunt wants to help patient but relatively she does not know what have happened . Per Aunt, I seen him a week ago, Friday and he was fine, I just do not know if it's the drugs or his mental illness. I cannot speak on much because once he stopped living with me I would hear from him some days and other days I would not. I finally got worried when he did not call me this past Friday to get money".  Aunt mentioned that patient has a brother and uncle, both do not fool with patient nor talk to her about him. Overall, aunt only knows about patient having SI attempts/thoughts.  Aunt confirmed that she was not aware of any guns or weapons and right now she has been working hard to help find patient a place to stay but states that everywhere is so high.   The suicide prevention education provided includes the following: Suicide risk  factors Suicide prevention and interventions National Suicide Hotline telephone number [facility] assessment telephone number [facility] Emergency Assistance [place] and/or Residential Mobile Crisis Unit telephone number  Request made of family/significant other to: Remove weapons (e.g., guns, rifles, knives), all items previously/currently identified as safety concern.   Remove drugs/medications (over-the-counter, prescriptions, illicit drugs), all items previously/currently identified as a safety concern.  The family member/significant other verbalizes understanding of the suicide prevention education information provided.  The family member/significant other agrees to remove the items of safety concern listed above.  Sherre Lain 03/01/2022, 3:35 PM
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
## 2022-03-01 NOTE — Progress Notes (Signed)
Pt remains on enteric precaution this shift with stool sample being positive for norovirus. Denies HI and VH. Endorses passive SI, verbally contracts for safety. Reports voices are disappearing. Received PRN Tylenol 650 mg PO for abdominal cramps along with Imodium 2 mg twice this shift for diarrhea X4. Pt reported relief from cramps when reassessed at 1315. Vitals WNL and pt is afebrile. Observed to be disheveled with body odor; encouraged to attend to his ADLs. Pt remains medication compliant, denies adverse drug reactions when assessed. Emotional support, encouragement and reassurance provided to pt throughout this shift. Safety maintained at Q 15 minutes intervals without incident. Pt tolerated meals, medications and fluids well. Safety maintained in milieu.

## 2022-03-01 NOTE — BH IP Treatment Plan (Signed)
Interdisciplinary Treatment and Diagnostic Plan Update  03/01/2022 Time of Session: 10:05am John [name] MRN: [id number]  Principal Diagnosis: MDD (major depressive disorder), recurrent, severe, with psychosis ([place])  Secondary Diagnoses: Principal Problem:   MDD (major depressive disorder), recurrent, severe, with psychosis ([place]) Active Problems:   PTSD (post-traumatic stress disorder)   HIV (human immunodeficiency virus infection) ([place])   Alcohol use disorder, severe, dependence ([place])   Cocaine use disorder, severe, dependence ([place])   Homelessness   Suicidal ideation   Asthma, chronic   Tobacco use disorder   Substance induced mood disorder ([place])   Hip pain   Psychoactive substance-induced psychosis ([place])   Current Medications:  Current Facility-Administered Medications  Medication Dose Route Frequency Provider Last Rate Last Admin   acetaminophen (TYLENOL) tablet 650 mg  650 mg Oral Q6H PRN France Ravens, MD   650 mg at 03/01/22 1217   albuterol (VENTOLIN HFA) 108 (90 Base) MCG/ACT inhaler 2 puff  2 puff Inhalation Q4H PRN France Ravens, MD   2 puff at 02/28/22 2028   alum & mag hydroxide-simeth (MAALOX/MYLANTA) 200-200-20 MG/5ML suspension 30 mL  30 mL Oral Q4H PRN France Ravens, MD   30 mL at 02/27/22 1647   bictegravir-emtricitabine-tenofovir AF (BIKTARVY) 50-200-25 MG per tablet 1 tablet  1 tablet Oral Daily France Ravens, MD   1 tablet at 03/01/22 0839   gabapentin (NEURONTIN) capsule 200 mg  200 mg Oral TID France Ravens, MD   200 mg at 03/01/22 1215   hydrOXYzine (ATARAX) tablet 25 mg  25 mg Oral TID PRN France Ravens, MD   25 mg at 02/27/22 2059   loperamide (IMODIUM) capsule 2 mg  2 mg Oral PRN Janine Limbo, MD   2 mg at 03/01/22 1217   magnesium hydroxide (MILK OF MAGNESIA) suspension 30 mL  30 mL Oral Daily PRN France Ravens, MD       ondansetron (ZOFRAN-ODT) disintegrating tablet 4 mg  4 mg Oral Q8H PRN France Ravens, MD       pantoprazole (PROTONIX) EC tablet 40 mg  40 mg Oral  Daily France Ravens, MD   40 mg at 03/01/22 7253   QUEtiapine (SEROQUEL XR) 24 hr tablet 300 mg  300 mg Oral QHS [place], Ovid Curd, MD       simethicone (MYLICON) chewable tablet 80 mg  80 mg Oral Q6H PRN France Ravens, MD   80 mg at 03/01/22 0142   traZODone (DESYREL) tablet 50 mg  50 mg Oral QHS PRN France Ravens, MD   50 mg at 02/28/22 2030   PTA Medications: Medications Prior to Admission  Medication Sig Dispense Refill Last Dose   albuterol (VENTOLIN HFA) 108 (90 Base) MCG/ACT inhaler Inhale 2 puffs into the lungs every 4 (four) hours as needed for wheezing or shortness of breath. 18 g 1    bictegravir-emtricitabine-tenofovir AF (BIKTARVY) 50-200-25 MG TABS tablet Take 1 tablet by mouth daily. 30 tablet 0    busPIRone (BUSPAR) 5 MG tablet Take 1 tablet (5 mg total) by mouth 2 (two) times daily. 60 tablet 1    QUEtiapine (SEROQUEL) 100 MG tablet Take 1 tablet (100 mg total) by mouth at bedtime as needed (For sleep). 30 tablet 1     Patient Stressors:    Patient Strengths:    Treatment Modalities: Medication Management, Group therapy, Case management,  1 to 1 session with clinician, Psychoeducation, Recreational therapy.   Physician Treatment Plan for Primary Diagnosis: MDD (major depressive disorder), recurrent, severe, with psychosis ([place]) Long Term  Goal(s):     Short Term Goals:    Medication Management: Evaluate patient's response, side effects, and tolerance of medication regimen.  Therapeutic Interventions: 1 to 1 sessions, Unit Group sessions and Medication administration.  Evaluation of Outcomes: Progressing  Physician Treatment Plan for Secondary Diagnosis: Principal Problem:   MDD (major depressive disorder), recurrent, severe, with psychosis ([place]) Active Problems:   PTSD (post-traumatic stress disorder)   HIV (human immunodeficiency virus infection) ([place])   Alcohol use disorder, severe, dependence ([place])   Cocaine use disorder, severe, dependence ([place])   Homelessness    Suicidal ideation   Asthma, chronic   Tobacco use disorder   Substance induced mood disorder ([place])   Hip pain   Psychoactive substance-induced psychosis ([place])  Long Term Goal(s):     Short Term Goals:       Medication Management: Evaluate patient's response, side effects, and tolerance of medication regimen.  Therapeutic Interventions: 1 to 1 sessions, Unit Group sessions and Medication administration.  Evaluation of Outcomes: Progressing   RN Treatment Plan for Primary Diagnosis: MDD (major depressive disorder), recurrent, severe, with psychosis ([place]) Long Term Goal(s): Knowledge of disease and therapeutic regimen to maintain health will improve  Short Term Goals: Ability to remain free from injury will improve, Ability to verbalize frustration and anger appropriately will improve, Ability to demonstrate self-control, Ability to participate in decision making will improve, Ability to verbalize feelings will improve, Ability to disclose and discuss suicidal ideas, Ability to identify and develop effective coping behaviors will improve, and Compliance with prescribed medications will improve  Medication Management: RN will administer medications as ordered by provider, will assess and evaluate patient's response and provide education to patient for prescribed medication. RN will report any adverse and/or side effects to prescribing provider.  Therapeutic Interventions: 1 on 1 counseling sessions, Psychoeducation, Medication administration, Evaluate responses to treatment, Monitor vital signs and CBGs as ordered, Perform/monitor CIWA, COWS, AIMS and Fall Risk screenings as ordered, Perform wound care treatments as ordered.  Evaluation of Outcomes: Progressing   LCSW Treatment Plan for Primary Diagnosis: MDD (major depressive disorder), recurrent, severe, with psychosis ([place]) Long Term Goal(s): Safe transition to appropriate next level of care at discharge, Engage patient in therapeutic  group addressing interpersonal concerns.  Short Term Goals: Engage patient in aftercare planning with referrals and resources, Increase social support, Increase ability to appropriately verbalize feelings, Increase emotional regulation, Facilitate acceptance of mental health diagnosis and concerns, Facilitate patient progression through stages of change regarding substance use diagnoses and concerns, Identify triggers associated with mental health/substance abuse issues, and Increase skills for wellness and recovery  Therapeutic Interventions: Assess for all discharge needs, 1 to 1 time with Social worker, Explore available resources and support systems, Assess for adequacy in community support network, Educate family and significant other(s) on suicide prevention, Complete Psychosocial Assessment, Interpersonal group therapy.  Evaluation of Outcomes: Progressing   Progress in Treatment: Attending groups: No. Participating in groups: No. Taking medication as prescribed: Yes. Toleration medication: Yes. Family/Significant other contact made: No, will contact:  Aunt and Rep Payee [name] [phone] Patient understands diagnosis: Yes. Discussing patient identified problems/goals with staff: Yes. Medical problems stabilized or resolved: No. Patient currently is on enteric precautions. Denies suicidal/homicidal ideation: Yes. Issues/concerns per patient self-inventory: No.   New problem(s) identified: No, Describe:  none reported   New Short Term/Long Term Goal(s):  medication stabilization, elimination of SI thoughts, development of comprehensive mental wellness plan.    Patient Goals:  Pt states, "I  want to stabilize on meds"  Discharge Plan or Barriers: Patient recently admitted. CSW will continue to follow and assess for appropriate referrals and possible discharge planning.    Reason for Continuation of Hospitalization: Anxiety Depression Hallucinations Medication  stabilization Suicidal ideation  Estimated Length of Stay: 1-3 days  Last [place] Suicide Severity Risk Score: [place] Admission (Current) from 02/26/2022 in [facility] ED from 02/25/2022 in [facility] ED from 02/11/2022 in [facility] High Risk High Risk No Risk       Last PHQ 2/9 Scores:    02/25/2022   11:28 AM 02/10/2021    9:33 AM 11/24/2020    9:40 AM  Depression screen PHQ 2/9  Decreased Interest 2 0 2  Down, Depressed, Hopeless 2 0 3  PHQ - 2 Score 4 0 5  Altered sleeping 3  3  Tired, decreased energy 3  1  Change in appetite 3  1  Feeling bad or failure about yourself  3  1  Trouble concentrating 3  1  Moving slowly or fidgety/restless 3  3  Suicidal thoughts 3  3  PHQ-9 Score 25  18  Difficult doing work/chores Very difficult      Scribe for Treatment Team: Zachery Conch, LCSW 03/01/2022 1:41 PM

## 2022-03-02 DIAGNOSIS — F333 Major depressive disorder, recurrent, severe with psychotic symptoms: Secondary | ICD-10-CM | POA: Diagnosis not present
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
## 2022-03-02 NOTE — Progress Notes (Signed)
[facility] LCSW Progress Note   03/02/2022    11:57 AM  John [name]      Type of Note: Collateral DC Planning   Writer went to speak with patient about DC options. Patient stated that he would have to "think" about the [place] housing/live work programs because he only uses drugs or alcohol when he is running from the things in his head or stress out. " I have that under control, do not need any housing like that". However, patient expressed how he has been living on the streets for 15 years and does not want to leave his friends. Writer did explain to patient that contact with aunt was made for safety planning and ongoing while he is here and she is willing to help him out to find a place . Per patient, " that is good, just send her the resources you have for housing and she can call them since I am in this room quarantining". Writer does have aunts email, and sent her an email of what we provide to patients with no home, homeless packet.     Signed:   Silas Flood, MSW, [facility] 03/02/2022 11:57 AM

## 2022-03-02 NOTE — Progress Notes (Signed)
Adult Psychoeducational Group Note  Date:  03/02/2022 Time:  9:05 PM  Group Topic/Focus:  Wrap-Up Group:   The focus of this group is to help patients review their daily goal of treatment and discuss progress on daily workbooks.  Pt is on room restriction due to noro viral precautions   John [name] 03/02/2022, 9:05 PM

## 2022-03-02 NOTE — Progress Notes (Addendum)
Pt continues to endorse passive SI and some AH, verbally contracts for safety and states the voices are not bothering him thus far "I can handle it". Denies HI, VH and pain at this time. Received PRN Tylenol (abdominal cramps) and Imodium both related to norovirus symptoms (see emar) at 0905 and reported relief when reassessed at 1000. States he's only had 1 loose stool since Imodium earlier this shift and denies abdominal cramps as well. Vitals WNL, he's afebrile. Remains medication compliant. Continued support, encouragement and reassurance offered to pt. Verbal education continues on infection prevention and current treatment regimen. Safety checks maintained at Q 15 minutes intervals without incident. Pt tolerates fluids, meals and medications well without discomfort. Safety maintained.

## 2022-03-02 NOTE — Progress Notes (Signed)
   03/02/22 0545  Sleep  Number of Hours 6.5

## 2022-03-02 NOTE — Progress Notes (Signed)
   03/02/22 2130  Psych Admission Type (Psych Patients Only)  Admission Status Voluntary  Psychosocial Assessment  Patient Complaints Anxiety;Restlessness  Eye Contact Fair  Facial Expression Sad  Affect Appropriate to circumstance  Speech Logical/coherent  Interaction Assertive  Motor Activity Slow  Appearance/Hygiene Disheveled  Behavior Characteristics Cooperative  Mood Anxious;Depressed  Aggressive Behavior  Effect No apparent injury  Thought Process  Coherency WDL  Content WDL  Delusions WDL  Perception WDL  Hallucination None reported or observed  Judgment WDL  Confusion None  Danger to Self  Current suicidal ideation? Passive  Self-Injurious Behavior Some self-injurious ideation observed or expressed.  No lethal plan expressed   Agreement Not to Harm Self Yes  Description of Agreement verbal contract for safety  Danger to Others  Danger to Others None reported or observed

## 2022-03-02 NOTE — Group Note (Signed)
Recreation Therapy Group Note   Group Topic:Health and Wellness  Group Date: 03/02/2022 Start Time: 1000 End Time: 1030 Facilitators: Joellen Tullos-McCall, LRT,CTRS Location: [address] Day[address]   Goal Area(s) Addresses:  Patient will verbalize benefit of exercise during group session. Patient will verbalize an exercise that can be completed in their hospital room during admission. Patient will identify an exercise that can be completed post d/c. Patient will acknowledge benefits of exercise when used as a coping mechanism.   Group Description:  Exercise.  LRT and patients talked about the importance of exercise and how it work together with the other aspects of wellness (mental and spiritual).  LRT then explained to patients they would engage in 30 minutes of exercise.  Each patient would take turns leading the group in the exercises of their choosing.  Patients were encouraged to take breaks and drink water as needed.  Patients were to make to most of the activity to increase heart rate and feel more energized.   Affect/Mood: N/A   Participation Level: Did not attend    Clinical Observations/Individualized Feedback:      Plan: Continue to engage patient in RT group sessions 2-3x/week.   John [name], LRT,CTRS 03/02/2022 10:33 AM

## 2022-03-03 DIAGNOSIS — F333 Major depressive disorder, recurrent, severe with psychotic symptoms: Secondary | ICD-10-CM | POA: Diagnosis not present

## 2022-03-03 NOTE — Progress Notes (Signed)
Psychoeducational Group Note  Date:  03/03/2022 Time:  2027  Group Topic/Focus:  Wrap-Up Group:   The focus of this group is to help patients review their daily goal of treatment and discuss progress on daily workbooks.  Participation Level: Did Not Attend  Participation Quality:  Not Applicable  Affect:  Not Applicable  Cognitive:  Not Applicable  Insight:  Not Applicable  Engagement in Group: Not Applicable  Additional Comments:  The patient did not attend group this evening.   Archie Balboa S 03/03/2022, 8:27 PM

## 2022-03-03 NOTE — Progress Notes (Signed)
   03/03/22 0515  Sleep  Number of Hours 7.5

## 2022-03-03 NOTE — Group Note (Signed)
Recreation Therapy Group Note   Group Topic:Communication  Group Date: 03/03/2022 Start Time: 1010 End Time: 7902 Facilitators: Tal Neer-McCall, LRT,CTRS Location: [address] Dayroom   Goal Area(s) Addresses:  Patient will effectively listen to complete activity.  Patient will identify communication skills used to make activity successful.  Patient will identify how skills used during activity can be used to reach post d/c goals.   Group Description: Geometric Drawings.  Three volunteers from the peer group will be shown an abstract picture with a particular arrangement of geometrical shapes.  Each round, one 'speaker' will describe the pattern, as accurately as possible without revealing the image to the group.  The remaining group members will listen and draw the picture to reflect how it is described to them. Patients with the role of 'listener' cannot ask clarifying questions but, may request that the speaker repeat a direction. Once the drawings are complete, the presenter will show the rest of the group the picture and compare how close each person came to drawing the picture. LRT will facilitate a post-activity discussion regarding effective communication and the importance of planning, listening, and asking for clarification in daily interactions with others.   Affect/Mood: N/A   Participation Level: Did not attend    Clinical Observations/Individualized Feedback:       Plan: Continue to engage patient in RT group sessions 2-3x/week.   Mikeal Winstanley-McCall, LRT,CTRS 03/03/2022 12:44 PM

## 2022-03-03 NOTE — Group Note (Signed)
LCSW Group Therapy Note  Group Date: 03/03/2022 Start Time: 1300 End Time: 1400   Type of Therapy and Topic:  Group Therapy - How To Cope with Nervousness about Discharge   Participation Level:  Did Not Attend   Description of Group This process group involved identification of patients' feelings about discharge. Some of them are scheduled to be discharged soon, while others are new admissions, but each of them was asked to share thoughts and feelings surrounding discharge from the hospital. One common theme was that they are excited at the prospect of going home, while another was that many of them are apprehensive about sharing why they were hospitalized. Patients were given the opportunity to discuss these feelings with their peers in preparation for discharge.  Therapeutic Goals  Patient will identify their overall feelings about pending discharge. Patient will think about how they might proactively address issues that they believe will once again arise once they get home (i.e. with parents). Patients will participate in discussion about having hope for change.   Summary of Patient Progress:  Did not attend   Therapeutic Modalities Cognitive Behavioral Therapy   Zachery Conch, LCSW 03/03/2022  1:59 PM

## 2022-03-03 NOTE — BHH Group Notes (Signed)
Adult Psychoeducational Group Note  Date:  03/03/2022 Time:  9:38 AM  Group Topic/Focus:  Goals Group:   The focus of this group is to help patients establish daily goals to achieve during treatment and discuss how the patient can incorporate goal setting into their daily lives to aide in recovery.  Participation Level:  Did Not Attend   Dub Mikes 03/03/2022, 9:38 AM
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
## 2022-03-03 NOTE — Progress Notes (Signed)
   03/03/22 2000  Psych Admission Type (Psych Patients Only)  Admission Status Voluntary  Psychosocial Assessment  Patient Complaints Anxiety  Eye Contact Fair  Facial Expression Sad  Affect Appropriate to circumstance  Speech Logical/coherent  Interaction Assertive  Motor Activity Slow  Appearance/Hygiene Disheveled  Behavior Characteristics Cooperative  Mood Anxious;Depressed  Aggressive Behavior  Effect No apparent injury  Thought Process  Coherency WDL  Content WDL  Delusions WDL  Perception WDL  Hallucination None reported or observed  Judgment WDL  Confusion None  Danger to Self  Current suicidal ideation? Passive  Self-Injurious Behavior Some self-injurious ideation observed or expressed.  No lethal plan expressed   Agreement Not to Harm Self Yes  Description of Agreement verbal contract for safety  Danger to Others  Danger to Others None reported or observed

## 2022-03-03 NOTE — Progress Notes (Signed)
   03/03/22 1000  Psych Admission Type (Psych Patients Only)  Admission Status Voluntary  Psychosocial Assessment  Patient Complaints Anxiety;Restlessness  Eye Contact Fair  Facial Expression Sad  Affect Appropriate to circumstance  Speech Logical/coherent  Interaction Assertive  Motor Activity Slow  Appearance/Hygiene Disheveled;Poor hygiene;Body odor  Behavior Characteristics Cooperative  Mood Anxious;Depressed  Aggressive Behavior  Effect No apparent injury  Thought Process  Coherency WDL  Content WDL  Delusions WDL  Perception WDL  Hallucination None reported or observed  Judgment WDL  Confusion None  Danger to Self  Current suicidal ideation? Passive  Self-Injurious Behavior Some self-injurious ideation observed or expressed.  No lethal plan expressed   Agreement Not to Harm Self Yes  Description of Agreement verbal  Danger to Others  Danger to Others None reported or observed

## 2022-03-04 DIAGNOSIS — F333 Major depressive disorder, recurrent, severe with psychotic symptoms: Secondary | ICD-10-CM | POA: Diagnosis not present

## 2022-03-04 NOTE — Group Note (Signed)
Recreation Therapy Group Note   Group Topic:Personal Development  Group Date: 03/04/2022 Start Time: 4709 End Time: 1050 Facilitators: Krystl Wickware-McCall, LRT,CTRS Location: [address] Dayroom   Goal Area(s) Addresses:  Patient will identify any personal growth they have made. Patient will successfully the importance of personal growth. Patient will successfully identify how their growth can be helpful post d/c.    Group Description: Reflection.  With year coming to an end, patients were to think and reflect on achievements, skills learned, lessons learned, lowest point, favorite things and the most important goal they have achieved.  Patients were then given a worksheet to write down all of the things they identified.  When finished, patients will share (if comfortable) their journey with the group.   Affect/Mood: N/A   Participation Level: Did not attend    Clinical Observations/Individualized Feedback:      Plan: Continue to engage patient in RT group sessions 2-3x/week.   Chelcey Caputo-McCall, LRT,CTRS 03/04/2022 11:56 AM

## 2022-03-04 NOTE — Progress Notes (Signed)
Pt awake in bed on initial interactions. Denies SI, HI, AVH and pain when assessed "I feel better right now. None of that". Presents animated with fair eye contact, logical speech, ambulatory with steady gait. Reports he slept well last night with good appetite. States he had just 1 form stool thus far "It was like meat ball and it's not loose". He's afebrile. Remains medication compliant, denies adverse drug reactions when assessed. Verbal education provided on current treatment regimen including labs and pt verbalized understanding. Q 15 minutes safety checks maintained without incident. Emotional support, encouragement and reassurance provided to pt. He tolerates meals, fluids and medications well without discomfort.

## 2022-03-04 NOTE — Progress Notes (Signed)
   03/04/22 0600  Sleep  Number of Hours 9
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
## 2022-03-05 ENCOUNTER — Encounter (HOSPITAL_COMMUNITY): Payer: Self-pay

## 2022-03-05 DIAGNOSIS — F333 Major depressive disorder, recurrent, severe with psychotic symptoms: Secondary | ICD-10-CM | POA: Diagnosis not present

## 2022-03-05 NOTE — Group Note (Signed)
LCSW Group Therapy Note   Group Date: 03/05/2022 Start Time: 1100 End Time: 1200   Type of Therapy and Topic:  Group Therapy: Wise Mind  Participation Level:  Did Not Attend  Description of Group:  Group members were presented with topic about wise mind, reasonable mind, and emotional mind.  Group members asked to identify situations were they used their wise mind, reasonable mind and emotional mind.  Members asked to identify how behaviors affected them after using parts of their mind and identified alternative behaviors they can use when they are in crisis.    Therapeutic Goals:  1. Patients will identify consequences of using emotional mind and rational mind.  2. Patients will engage in discussion on how they cope when they are in crisis 3.  Patients will discuss coping mechanisms to engage their wise mind     Summary of Patient Progress:  Did Not Attend    Therapeutic Modalities:  DBT Solution focused therapy   Charlett Lango 03/05/2022  12:48 PM

## 2022-03-05 NOTE — Group Note (Signed)
Recreation Therapy Group Note   Group Topic:Team Building  Group Date: 03/05/2022 Start Time: 1000 End Time: 1050 Facilitators: Tin Engram-McCall, LRT,CTRS Location: [address] Dayroom   Goal Area(s) Addresses:  Patient will work effectively in teams to accomplish shared goal.   Patient will verbalize skills needed to make activity successful.  Patient will verbalize relationship between skills needed and building healthy support system.    Group Description: Christmas Trivia.  Patients were split into two groups.  The activity was eight rounds split into two halves.  After each round, groups would add up their points and write it at the bottom.  At the end of each half, teams would add up the points from each round for their total points for the half.  At the end of the game, the group with the most points, gets a prize.    Affect/Mood: N/A   Participation Level: Did not attend    Clinical Observations/Individualized Feedback:      Plan: Continue to engage patient in RT group sessions 2-3x/week.   Wing Gfeller-McCall, LRT,CTRS 03/05/2022 12:05 PM

## 2022-03-05 NOTE — Progress Notes (Signed)
[facility] LCSW Progress Note   03/05/2022    9:37 AM  John [name]      Type of Note: Collateral Call Regarding DC   CSW spoke with aunt regarding patient DC for Saturday. Aunt stated that she will be picking patient up Saturday at noon and had no questions or concerns at this time.      Signed:   Silas Flood, MSW, [facility] 03/05/2022 9:37 AM

## 2022-03-05 NOTE — Progress Notes (Signed)
   03/05/22 1000  Psych Admission Type (Psych Patients Only)  Admission Status Voluntary  Psychosocial Assessment  Patient Complaints None  Eye Contact Fair  Facial Expression Animated  Affect Appropriate to circumstance  Speech Logical/coherent  Interaction Assertive  Motor Activity Slow  Appearance/Hygiene Body odor;Disheveled  Behavior Characteristics Cooperative  Mood Pleasant  Aggressive Behavior  Effect No apparent injury  Thought Process  Coherency WDL  Content WDL  Delusions WDL  Perception WDL  Hallucination None reported or observed  Judgment WDL  Confusion None  Danger to Self  Current suicidal ideation? Denies  Self-Injurious Behavior No self-injurious ideation or behavior indicators observed or expressed   Agreement Not to Harm Self Yes  Description of Agreement Verbal contract  Danger to Others  Danger to Others None reported or observed

## 2022-03-05 NOTE — Progress Notes (Signed)
   03/05/22 2100  Psych Admission Type (Psych Patients Only)  Admission Status Voluntary  Psychosocial Assessment  Patient Complaints None  Eye Contact Fair  Facial Expression Sad  Affect Appropriate to circumstance  Speech Logical/coherent  Interaction Assertive  Motor Activity Slow  Appearance/Hygiene Disheveled  Behavior Characteristics Cooperative  Mood Pleasant  Aggressive Behavior  Effect No apparent injury  Thought Process  Coherency WDL  Content WDL  Delusions WDL  Perception WDL  Hallucination None reported or observed  Judgment WDL  Confusion None  Danger to Self  Current suicidal ideation? Passive  Self-Injurious Behavior Some self-injurious ideation observed or expressed.  No lethal plan expressed   Agreement Not to Harm Self Yes  Description of Agreement verbal contract for safety  Danger to Others  Danger to Others None reported or observed
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
## 2022-03-05 NOTE — BH IP Treatment Plan (Signed)
Interdisciplinary Treatment and Diagnostic Plan Update  03/05/2022 Time of Session: [place] MRN: [id number]  Principal Diagnosis: MDD (major depressive disorder), recurrent, severe, with psychosis ([place])  Secondary Diagnoses: Principal Problem:   MDD (major depressive disorder), recurrent, severe, with psychosis ([place]) Active Problems:   PTSD (post-traumatic stress disorder)   HIV (human immunodeficiency virus infection) ([place])   Alcohol use disorder, severe, dependence ([place])   Cocaine use disorder, severe, dependence ([place])   Homelessness   Suicidal ideation   Asthma, chronic   Tobacco use disorder   Substance induced mood disorder ([place])   Hip pain   Psychoactive substance-induced psychosis ([place])   Current Medications:  Current Facility-Administered Medications  Medication Dose Route Frequency Provider Last Rate Last Admin   acetaminophen (TYLENOL) tablet 650 mg  650 mg Oral Q6H PRN France Ravens, MD   650 mg at 03/02/22 0905   albuterol (VENTOLIN HFA) 108 (90 Base) MCG/ACT inhaler 2 puff  2 puff Inhalation Q4H PRN France Ravens, MD   2 puff at 03/05/22 0838   alum & mag hydroxide-simeth (MAALOX/MYLANTA) 200-200-20 MG/5ML suspension 30 mL  30 mL Oral Q4H PRN France Ravens, MD   30 mL at 02/27/22 1647   bictegravir-emtricitabine-tenofovir AF (BIKTARVY) 50-200-25 MG per tablet 1 tablet  1 tablet Oral Daily France Ravens, MD   1 tablet at 03/05/22 3709   gabapentin (NEURONTIN) capsule 200 mg  200 mg Oral TID France Ravens, MD   200 mg at 03/05/22 6438   hydrOXYzine (ATARAX) tablet 25 mg  25 mg Oral TID PRN France Ravens, MD   25 mg at 03/04/22 2109   loperamide (IMODIUM) capsule 2 mg  2 mg Oral PRN Janine Limbo, MD   2 mg at 03/03/22 1632   magnesium hydroxide (MILK OF MAGNESIA) suspension 30 mL  30 mL Oral Daily PRN France Ravens, MD       ondansetron (ZOFRAN-ODT) disintegrating tablet 4 mg  4 mg Oral Q8H PRN France Ravens, MD       pantoprazole (PROTONIX) EC tablet 40 mg  40 mg Oral Daily  France Ravens, MD   40 mg at 03/04/22 0854   QUEtiapine (SEROQUEL XR) 24 hr tablet 300 mg  300 mg Oral QHS [place], Ovid Curd, MD   300 mg at 03/04/22 2109   simethicone (MYLICON) chewable tablet 80 mg  80 mg Oral Q6H PRN France Ravens, MD   80 mg at 03/01/22 0142   traZODone (DESYREL) tablet 50 mg  50 mg Oral QHS PRN France Ravens, MD   50 mg at 03/04/22 2109   PTA Medications: Medications Prior to Admission  Medication Sig Dispense Refill Last Dose   albuterol (VENTOLIN HFA) 108 (90 Base) MCG/ACT inhaler Inhale 2 puffs into the lungs every 4 (four) hours as needed for wheezing or shortness of breath. 18 g 1    bictegravir-emtricitabine-tenofovir AF (BIKTARVY) 50-200-25 MG TABS tablet Take 1 tablet by mouth daily. 30 tablet 0    busPIRone (BUSPAR) 5 MG tablet Take 1 tablet (5 mg total) by mouth 2 (two) times daily. 60 tablet 1    QUEtiapine (SEROQUEL) 100 MG tablet Take 1 tablet (100 mg total) by mouth at bedtime as needed (For sleep). 30 tablet 1     Patient Stressors:    Patient Strengths:    Treatment Modalities: Medication Management, Group therapy, Case management,  1 to 1 session with clinician, Psychoeducation, Recreational therapy.   Physician Treatment Plan for Primary Diagnosis: MDD (major depressive disorder), recurrent, severe, with psychosis (  [place]) Long Term Goal(s):     Short Term Goals:    Medication Management: Evaluate patient's response, side effects, and tolerance of medication regimen.  Therapeutic Interventions: 1 to 1 sessions, Unit Group sessions and Medication administration.  Evaluation of Outcomes: Progressing  Physician Treatment Plan for Secondary Diagnosis: Principal Problem:   MDD (major depressive disorder), recurrent, severe, with psychosis ([place]) Active Problems:   PTSD (post-traumatic stress disorder)   HIV (human immunodeficiency virus infection) ([place])   Alcohol use disorder, severe, dependence ([place])   Cocaine use disorder, severe, dependence ([place])    Homelessness   Suicidal ideation   Asthma, chronic   Tobacco use disorder   Substance induced mood disorder ([place])   Hip pain   Psychoactive substance-induced psychosis ([place])  Long Term Goal(s):     Short Term Goals:       Medication Management: Evaluate patient's response, side effects, and tolerance of medication regimen.  Therapeutic Interventions: 1 to 1 sessions, Unit Group sessions and Medication administration.  Evaluation of Outcomes: Progressing   RN Treatment Plan for Primary Diagnosis: MDD (major depressive disorder), recurrent, severe, with psychosis ([place]) Long Term Goal(s): Knowledge of disease and therapeutic regimen to maintain health will improve  Short Term Goals: Ability to remain free from injury will improve, Ability to verbalize frustration and anger appropriately will improve, Ability to demonstrate self-control, Ability to participate in decision making will improve, Ability to verbalize feelings will improve, Ability to disclose and discuss suicidal ideas, Ability to identify and develop effective coping behaviors will improve, and Compliance with prescribed medications will improve  Medication Management: RN will administer medications as ordered by provider, will assess and evaluate patient's response and provide education to patient for prescribed medication. RN will report any adverse and/or side effects to prescribing provider.  Therapeutic Interventions: 1 on 1 counseling sessions, Psychoeducation, Medication administration, Evaluate responses to treatment, Monitor vital signs and CBGs as ordered, Perform/monitor CIWA, COWS, AIMS and Fall Risk screenings as ordered, Perform wound care treatments as ordered.  Evaluation of Outcomes: Progressing   LCSW Treatment Plan for Primary Diagnosis: MDD (major depressive disorder), recurrent, severe, with psychosis ([place]) Long Term Goal(s): Safe transition to appropriate next level of care at discharge, Engage patient  in therapeutic group addressing interpersonal concerns.  Short Term Goals: Engage patient in aftercare planning with referrals and resources, Increase social support, Increase ability to appropriately verbalize feelings, Increase emotional regulation, Facilitate acceptance of mental health diagnosis and concerns, Facilitate patient progression through stages of change regarding substance use diagnoses and concerns, Identify triggers associated with mental health/substance abuse issues, and Increase skills for wellness and recovery  Therapeutic Interventions: Assess for all discharge needs, 1 to 1 time with Social worker, Explore available resources and support systems, Assess for adequacy in community support network, Educate family and significant other(s) on suicide prevention, Complete Psychosocial Assessment, Interpersonal group therapy.  Evaluation of Outcomes: Progressing   Progress in Treatment: Attending groups: Yes. Participating in groups: Yes. Taking medication as prescribed: Yes. Toleration medication: Yes. Family/Significant other contact made: [name] [phone] Patient understands diagnosis: Yes. Discussing patient identified problems/goals with staff: Yes. Medical problems stabilized or resolved: Yes. Denies suicidal/homicidal ideation: Yes. Issues/concerns per patient self-inventory: Yes. Other:   New problem(s) identified: No, Describe:  None Reported  New Short Term/Long Term Goal(s):  Patient Goals:  Medication Stabilization  Discharge Plan or Barriers: none reported  Reason for Continuation of Hospitalization: Anxiety Delusions  Depression Hallucinations Medication stabilization  Estimated Length of Stay: 3-7 Days  Last 3 [place] Suicide Severity Risk Score: [place] Admission (Current) from 02/26/2022 in [facility] ED from [facility]25/2022 in [facility] ED from 02/11/2022 in [facility] High Risk High Risk No Risk       Last PHQ 2/9 Scores:    02/25/2022   11:28 AM 02/10/2021    9:33 AM 11/24/2020    9:40 AM  Depression screen PHQ 2/9  Decreased Interest 2 0 2  Down, Depressed, Hopeless 2 0 3  PHQ - 2 Score 4 0 5  Altered sleeping 3  3  Tired, decreased energy 3  1  Change in appetite 3  1  Feeling bad or failure about yourself  3  1  Trouble concentrating 3  1  Moving slowly or fidgety/restless 3  3  Suicidal thoughts 3  3  PHQ-9 Score 25  18  Difficult doing work/chores Very difficult     detox, medication management for mood stabilization; elimination of SI thoughts; development of comprehensive mental wellness/sobriety plan   Scribe for Treatment Team: Windle Guard, LCSW 03/05/2022 11:13 AM

## 2022-03-05 NOTE — B[facility] Group Notes (Signed)
Spirituality group facilitated by Chaplain Katy Ayinde Swim, BCC.   Group Description: Group focused on topic of hope. Patients participated in facilitated discussion around topic, connecting with one another around experiences and definitions for hope. Group members engaged with visual explorer photos, reflecting on what hope looks like for them today. Group engaged in discussion around how their definitions of hope are present today in hospital.   Modalities: Psycho-social ed, Adlerian, Narrative, MI   Patient Progress: Did not attend.  

## 2022-03-05 NOTE — Progress Notes (Signed)
   03/05/22 0555  15 Minute Checks  Location Bedroom  Visual Appearance Calm  Behavior Composed  Sleep (Behavioral Health Patients Only)  Calculate sleep? (Click Yes once per 24 hr at 0600 safety check) Yes  Documented sleep last 24 hours 11.25

## 2022-03-05 NOTE — Progress Notes (Signed)
Patient did not attend morning group.

## 2022-03-06 DIAGNOSIS — F333 Major depressive disorder, recurrent, severe with psychotic symptoms: Secondary | ICD-10-CM | POA: Diagnosis not present

## 2022-03-06 LAB — HIV-1 RNA QUANT-NO REFLEX-BLD
HIV 1 RNA Quant: <20 copies/mL
LOG10 HIV-1 RNA: UNDETERMINED log10copy/mL

## 2022-03-06 MED ORDER — PANTOPRAZOLE SODIUM 40 MG PO TBEC: 40.0000 mg | DELAYED_RELEASE_TABLET | Freq: Every day | ORAL | 0 refills | Status: DC

## 2022-03-06 MED ORDER — GABAPENTIN 100 MG PO CAPS: 200.0000 mg | ORAL_CAPSULE | Freq: Three times a day (TID) | ORAL | 0 refills | Status: DC

## 2022-03-06 MED ORDER — QUETIAPINE FUMARATE ER 300 MG PO TB24: 300.0000 mg | ORAL_TABLET | Freq: Every day | ORAL | 0 refills | Status: DC

## 2022-03-06 MED ORDER — TRAZODONE HCL 50 MG PO TABS: 50.0000 mg | ORAL_TABLET | Freq: Every evening | ORAL | 0 refills | Status: DC | PRN

## 2022-03-06 NOTE — Progress Notes (Signed)
   03/06/22 0556  15 Minute Checks  Location Bedroom  Visual Appearance Calm  Behavior Composed  Sleep (Behavioral Health Patients Only)  Calculate sleep? (Click Yes once per 24 hr at 0600 safety check) Yes  Documented sleep last 24 hours 12

## 2022-03-06 NOTE — BHH Suicide Risk Assessment (Signed)
[facility] Discharge Suicide Risk Assessment   Principal Problem: MDD (major depressive disorder), recurrent, severe, with psychosis ([place]) Discharge Diagnoses: Principal Problem:   MDD (major depressive disorder), recurrent, severe, with psychosis ([place]) Active Problems:   PTSD (post-traumatic stress disorder)   HIV (human immunodeficiency virus infection) ([place])   Alcohol use disorder, severe, dependence ([place])   Cocaine use disorder, severe, dependence ([place])   Homelessness   Suicidal ideation   Asthma, chronic   Tobacco use disorder   Substance induced mood disorder ([place])   Hip pain   Psychoactive substance-induced psychosis ([place])   Total Time spent with patient: 30 minutes  Musculoskeletal: Strength & Muscle Tone: within normal limits Gait & Station: normal Patient leans: N/A  Psychiatric Specialty Exam  Presentation  General Appearance: no clothes on top, sitting in bed. Unkempt long hair. Pleasant and cooperative.  Eye Contact: Good Speech: Normal Rate Speech Volume:Normal Handedness:Right  Mood and Affect  Mood: "Great" Duration of Depression Symptoms: Greater than two weeks Affect: Congruent to mood, bright  Thought Process  Thought Processes: linear with no loose association Descriptions of Associations:Intact Orientation:Full (Time, Place and Person) Thought Content:Logical History of Schizophrenia/Schizoaffective disorder:Yes Duration of Psychotic Symptoms:Greater than six months Hallucinations: patient denies Ideas of Reference:None Suicidal Thoughts: patient denies Homicidal Thoughts: patient denies  Sensorium  Memory:Immediate Good; Recent Good; Remote Good Judgment: Good Insight: Fair-good  Executive Functions  Concentration: Fair Attention Span:Fair [place] of Knowledge:Fair Language:Fair  Psychomotor Activity  Psychomotor Activity: Normal 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 rate 18, height '5\' 9"'$  (1.753 m), weight 68 kg, SpO2 100 %. Body mass index is 22.15 kg/m.  Mental Status Per Nursing Assessment::   On Admission:  Suicidal ideation indicated by patient  Demographic Factors:  Male, Caucasian, Low socioeconomic status, and Unemployed  Loss Factors: Financial problems/change in socioeconomic status  Historical Factors: Prior suicide attempts  Risk Reduction Factors:   Sense of responsibility to family, Living with another person, especially a relative, Positive social support, Positive therapeutic relationship, and Positive coping skills or problem solving skills  Continued Clinical Symptoms:  Alcohol/Substance Abuse/Dependencies  Cognitive Features That Contribute To Risk:  None    Suicide Risk:  Minimal: No identifiable suicidal ideation.  Patients presenting with no risk factors but with morbid ruminations; may be classified as minimal risk based on the severity of the depressive symptoms.   The patient was assessed this morning. He stated his mood is "great" he feels very well. His sleep is good. Appetite is fine. He denies suicidal and homicidal thoughts. Denies hallucination. He is future oriented. His affect was bright. He was able to verbalize his safety plan steps and said he will call back 911 if SI occurs. But said his stress resource gon away so he is not worried. He wants to leave hospital. He doe snot meet criteria of  IVC to keep him in the psychiatric hospital. His aunt will pick him up today whom he will live with.     [place]. Go to.   Specialty: Behavioral Health Why: Please go to this provider for therapy and medication management services on Monday through Friday, arrive no later than 7:20 am.   Services are provided on a first come, first served basis. Contact information:  [place] for Infectious Disease Follow up.   Specialty: Infectious Diseases Why: Please follow up with this provider for ongoing health maintenance. Contact information: [place], [place] [id number] [place] [phone]                Plan Of Care/Follow-up recommendations:  Activity:  Normal Diet:  Normal  -Follow-up with your outpatient psychiatric provider -instructions on appointment date, time, and address (location) are provided to you in discharge paperwork.   -Take your psychiatric medications as prescribed at discharge - instructions are provided to you in the discharge paperwork   -Follow-up with outpatient primary care doctor for routine medical care   -Recommend maintaining abstinence from alcohol, tobacco, and other illicit drug use at discharge.    -If your psychiatric symptoms recur, worsen, or if you have side effects to your psychiatric medications, call your outpatient psychiatric provider, 911, [phone] or go to the nearest emergency department.   -If suicidal thoughts recur, call your outpatient psychiatric provider, 911, [phone] or go to the nearest emergency department.   Helane Gunther, MD 03/06/2022, 10:14 AM

## 2022-03-06 NOTE — Progress Notes (Signed)
DISCHARGE NOTE:  All possessions/property returned to patient and signature obtained. Verbal and written education provided to patient regarding follow up care, treatment plan and discharge medications. Pt verbalized comprehension. Pt currently denies suicidal ideations, denies homicidal ideations, denies visual hallucination, and denies auditory hallucinations. No c/o pain and/or discomfort. All precautions discontinued by physician. Pt remains calm, cooperative, medication-compliant, ambulatory and functioning at Pt's optimal level. There are no indications that this Pt is engaged in self-directed violent behavior, either preparatory or potentially harmful. Pt was picked up by his aunt.

## 2022-03-06 NOTE — Progress Notes (Signed)
  [facility] Adult Case Management Discharge Plan :  Will you be returning to the same living situation after discharge:  No.  Staying with aunt At discharge, do you have transportation home?: Yes,  aunt Do you have the ability to pay for your medications: Yes,  Medicaid  Release of information consent forms completed and emailed to Medical Records, then turned in to Medical Records by CSW.   Patient to Follow up at:  [facility]. Go to.   Specialty: Behavioral Health Why: Please go to this provider for therapy and medication management services on Monday through Friday, arrive no later than 7:20 am.  Services are provided on a first come, first served basis. Contact information: [place] for Infectious Disease Follow up.   Specialty: Infectious Diseases Why: Please follow up with this provider for ongoing health maintenance. Contact information: [place], [place] [id number] [place] [phone]                Next level of care provider has access to [place] and Suicide Prevention discussed: Yes,  with aunt     Has patient been referred to the Quitline?: Patient refused referral  Patient has been referred for addiction treatment: N/A  Maretta Los, LCSW 03/06/2022, 10:54 AM
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
## 2022-03-06 NOTE — Group Note (Signed)
LCSW Group Therapy Note  03/06/2022      Type of Therapy and Topic:  Group Therapy: Gratitude   Description:   Group could not be held by Education officer, museum, but Airline pilot did provide group.  A handout was given to each patient, with the following information:   Gratitude  "Acknowledging the good that you already have in your life is the foundation for all abundance." - Betha Loa  " 'Enough' is a feast." - Buddhist Proverb  "Gratitude sweetens even the smallest moments."  "It is not joy that makes [name] grateful; It is gratitude that makes [name] joyful." - Elroy Channel    Put at least one response under each category of something for which you are grateful:  People:  Experiences:  Things:  Places:  Skills:  Other:  Add more responses as you get ideas from other people.   Therapeutic Modalities:   Activity  Maretta Los, LCSW

## 2022-03-24 ENCOUNTER — Other Ambulatory Visit: Payer: Self-pay

## 2022-03-24 ENCOUNTER — Emergency Department (HOSPITAL_COMMUNITY): Payer: Medicaid Other

## 2022-03-24 ENCOUNTER — Encounter (HOSPITAL_COMMUNITY): Payer: Self-pay

## 2022-03-24 ENCOUNTER — Emergency Department (HOSPITAL_COMMUNITY)
Admission: EM | Admit: 2022-03-24 | Discharge: 2022-03-24 | Disposition: A | Discharge status: Home / Self Care | Payer: Medicaid Other | Attending: Emergency Medicine | Admitting: Emergency Medicine

## 2022-03-24 DIAGNOSIS — J40 Bronchitis, not specified as acute or chronic: Secondary | ICD-10-CM | POA: Diagnosis not present

## 2022-03-24 DIAGNOSIS — Z1152 Encounter for screening for COVID-19: Secondary | ICD-10-CM | POA: Insufficient documentation

## 2022-03-24 DIAGNOSIS — R0602 Shortness of breath: Secondary | ICD-10-CM | POA: Diagnosis present

## 2022-03-24 DIAGNOSIS — Z21 Asymptomatic human immunodeficiency virus [HIV] infection status: Secondary | ICD-10-CM | POA: Diagnosis not present

## 2022-03-24 DIAGNOSIS — F17200 Nicotine dependence, unspecified, uncomplicated: Secondary | ICD-10-CM | POA: Diagnosis not present

## 2022-03-24 LAB — CBC WITH DIFFERENTIAL/PLATELET
Abs Immature Granulocytes: 0.01 10*3/uL (ref 0.00–0.07)
Basophils Absolute: 0 10*3/uL (ref 0.0–0.1)
Basophils Relative: 1 %
Eosinophils Absolute: 0.1 10*3/uL (ref 0.0–0.5)
Eosinophils Relative: 2 %
HCT: 44.6 % (ref 39.0–52.0)
Hemoglobin: 14 g/dL (ref 13.0–17.0)
Immature Granulocytes: 0 %
Lymphocytes Relative: 52 %
Lymphs Abs: 2.3 10*3/uL (ref 0.7–4.0)
MCH: 29.1 pg (ref 26.0–34.0)
MCHC: 31.4 g/dL (ref 30.0–36.0)
MCV: 92.7 fL (ref 80.0–100.0)
Monocytes Absolute: 0.3 10*3/uL (ref 0.1–1.0)
Monocytes Relative: 6 %
Neutro Abs: 1.7 10*3/uL (ref 1.7–7.7)
Neutrophils Relative %: 39 %
Platelets: 369 10*3/uL (ref 150–400)
RBC: 4.81 MIL/uL (ref 4.22–5.81)
RDW: 13.8 % (ref 11.5–15.5)
WBC: 4.4 10*3/uL (ref 4.0–10.5)
nRBC: 0 % (ref 0.0–0.2)

## 2022-03-24 LAB — RESP PANEL BY RT-PCR (RSV, FLU A&B, COVID)  RVPGX2
Influenza A by PCR: NEGATIVE
Influenza B by PCR: NEGATIVE
Resp Syncytial Virus by PCR: NEGATIVE
SARS Coronavirus 2 by RT PCR: NEGATIVE

## 2022-03-24 LAB — BASIC METABOLIC PANEL
Anion gap: 11 (ref 5–15)
BUN: 9 mg/dL (ref 6–20)
CO2: 23 mmol/L (ref 22–32)
Calcium: 8.7 mg/dL — ABNORMAL LOW (ref 8.9–10.3)
Chloride: 104 mmol/L (ref 98–111)
Creatinine, Ser: 0.93 mg/dL (ref 0.61–1.24)
GFR, Estimated: >60 mL/min (ref >60)
Glucose, Bld: 94 mg/dL (ref 70–99)
Potassium: 4.1 mmol/L (ref 3.5–5.1)
Sodium: 138 mmol/L (ref 135–145)

## 2022-03-24 LAB — BRAIN NATRIURETIC PEPTIDE: B Natriuretic Peptide: 68 pg/mL (ref 0.0–100.0)

## 2022-03-24 MED ORDER — ALBUTEROL SULFATE HFA 108 (90 BASE) MCG/ACT IN AERS
2.0000 | INHALATION_SPRAY | Freq: Four times a day (QID) | RESPIRATORY_TRACT | Status: DC
  Administered 2022-03-24: 2 via RESPIRATORY_TRACT
  Filled 2022-03-24: qty 6.7

## 2022-03-24 MED ORDER — DEXAMETHASONE SODIUM PHOSPHATE 10 MG/ML IJ SOLN
10.0000 mg | Freq: Once | INTRAMUSCULAR | Status: AC
  Administered 2022-03-24: 10 mg via INTRAVENOUS
  Filled 2022-03-24: qty 1

## 2022-03-24 MED ORDER — PREDNISONE 20 MG PO TABS: 40.0000 mg | ORAL_TABLET | Freq: Every day | ORAL | 0 refills | Status: DC

## 2022-03-24 MED ORDER — ALBUTEROL SULFATE (2.5 MG/3ML) 0.083% IN NEBU
12.0000 mL | INHALATION_SOLUTION | Freq: Once | RESPIRATORY_TRACT | Status: AC
  Administered 2022-03-24: 12 mL via RESPIRATORY_TRACT
  Filled 2022-03-24: qty 9

## 2022-03-24 MED ORDER — AZITHROMYCIN 250 MG PO TABS
500.0000 mg | ORAL_TABLET | Freq: Once | ORAL | Status: AC
  Administered 2022-03-24: 500 mg via ORAL
  Filled 2022-03-24: qty 2

## 2022-03-24 MED ORDER — AZITHROMYCIN 250 MG PO TABS: 250.0000 mg | ORAL_TABLET | Freq: Every day | ORAL | 0 refills | Status: AC

## 2022-03-24 NOTE — ED Triage Notes (Addendum)
Patient BIB [facility] from a shelter for evaluation of shortness of breath EMS reports on initial assessment patient in apparent severe respiratory distress gasping and speaking speaking in one-word sentences, history of asthma. Patient received 2g magnesium, 125 mg solumedrol, and '5mg'$  atrovent, '5mg'$  albuterol  nebulizer from EMS PTA. Patient is breathing easily now. Seen for same late last night and received same treatment from EMS but refused transport.

## 2022-03-24 NOTE — Discharge Instructions (Addendum)
Discussed, important that you use the albuterol every 4 hours for the next 2 days, then as needed for relief.  Please obtain and take the steroids and antibiotics prescribed for you.  Follow-up with your physician or return here for concerning changes in your condition.

## 2022-03-24 NOTE — ED Notes (Signed)
All discharge instructions reviewed with patient including follow up care and prescriptions. Patient verbalized understanding of same and had no other questions. Patient stable and ambulatory at time of discharge and bus pass was provided to patient prior to leaving the department.

## 2022-03-24 NOTE — ED Provider Notes (Signed)
[facility] EMERGENCY DEPARTMENT Provider Note   CSN: [id number] Arrival date & time: 03/24/22  1139     History  Chief Complaint  Patient presents with   Shortness of Breath    John [name] is a 52 y.o. male.  HPI Patient presents with dyspnea.  Patient has multiple medical issue including HIV, smokes, over past 3 days has had dyspnea, cough, congestion, fever.  He has been unable to take his medication during this time.    Home Medications Prior to Admission medications   Medication Sig Start Date End Date Taking? Authorizing Provider  azithromycin (ZITHROMAX) 250 MG tablet Take 1 tablet (250 mg total) by mouth daily for 4 days. Take 1 every day until finished. 03/24/22 03/28/22 Yes Carmin Muskrat, MD  predniSONE (DELTASONE) 20 MG tablet Take 2 tablets (40 mg total) by mouth daily with breakfast. For the next four days 03/24/22  Yes Carmin Muskrat, MD  albuterol (VENTOLIN HFA) 108 (90 Base) MCG/ACT inhaler Inhale 2 puffs into the lungs every 4 (four) hours as needed for wheezing or shortness of breath. 11/26/21   Little Ishikawa, MD  bictegravir-emtricitabine-tenofovir AF (BIKTARVY) 50-200-25 MG TABS tablet Take 1 tablet by mouth daily. 11/26/21   Macon Callas, NP  gabapentin (NEURONTIN) 100 MG capsule Take 2 capsules (200 mg total) by mouth 3 (three) times daily. 03/06/22 04/05/22  Helane Gunther, MD  pantoprazole (PROTONIX) 40 MG tablet Take 1 tablet (40 mg total) by mouth daily. 03/07/22 04/06/22  Helane Gunther, MD  QUEtiapine (SEROQUEL XR) 300 MG 24 hr tablet Take 1 tablet (300 mg total) by mouth at bedtime. 03/06/22 04/05/22  Helane Gunther, MD  traZODone (DESYREL) 50 MG tablet Take 1 tablet (50 mg total) by mouth at bedtime as needed for sleep (sleep difficulty). 03/06/22 04/05/22  Helane Gunther, MD  dicyclomine (BENTYL) 20 MG tablet Take 1 tablet (20 mg total) by mouth 2 (two) times daily. 01/08/17 01/03/20  Palumbo, April, MD  sucralfate  (CARAFATE) 1 GM/10ML suspension Take 10 mLs (1 g total) by mouth 4 (four) times daily -  with meals and at bedtime. Patient not taking: Reported on 10/21/2017 01/08/17 09/03/18  Palumbo, April, MD      Allergies    Shellfish allergy    Review of Systems   Review of Systems  All other systems reviewed and are negative.   Physical Exam Updated Vital Signs BP 121/70   Pulse 96   Temp 98.4 F (36.9 C) (Oral)   Resp 16   Ht '5\' 9"'$  (1.753 m)   Wt 68 kg   SpO2 100%   BMI 22.14 kg/m  Physical Exam Vitals and nursing note reviewed.  Constitutional:      General: He is not in acute distress.    Appearance: He is well-developed.  HENT:     Head: Normocephalic and atraumatic.  Eyes:     Conjunctiva/sclera: Conjunctivae normal.  Cardiovascular:     Rate and Rhythm: Normal rate and regular rhythm.  Pulmonary:     Effort: Pulmonary effort is normal. Tachypnea present. No respiratory distress.     Breath sounds: No stridor. Decreased breath sounds and wheezing present.  Abdominal:     General: There is no distension.  Skin:    General: Skin is warm and dry.  Neurological:     Mental Status: He is alert and oriented to person, place, and time.     ED Results / Procedures / Treatments   Labs (all labs ordered  are listed, but only abnormal results are displayed) Labs Reviewed  RESP PANEL BY RT-PCR (RSV, FLU A&B, COVID)  RVPGX2 - Abnormal; Notable for the following components:      Result Value   SARS Coronavirus 2 by RT PCR   (*)    Value: INVALID, UNABLE TO DETERMINE THE PRESENCE OF TARGET DUE TO SPECIMEN INTEGRITY. RECOLLECTION REQUESTED.   Influenza A by PCR   (*)    Value: INVALID, UNABLE TO DETERMINE THE PRESENCE OF TARGET DUE TO SPECIMEN INTEGRITY. RECOLLECTION REQUESTED.   Influenza B by PCR   (*)    Value: INVALID, UNABLE TO DETERMINE THE PRESENCE OF TARGET DUE TO SPECIMEN INTEGRITY. RECOLLECTION REQUESTED.   Resp Syncytial Virus by PCR   (*)    Value: INVALID, UNABLE TO  DETERMINE THE PRESENCE OF TARGET DUE TO SPECIMEN INTEGRITY. RECOLLECTION REQUESTED.   All other components within normal limits  BASIC METABOLIC PANEL - Abnormal; Notable for the following components:   Calcium 8.7 (*)    All other components within normal limits  RESP PANEL BY RT-PCR (RSV, FLU A&B, COVID)  RVPGX2  CBC WITH DIFFERENTIAL/PLATELET  BRAIN NATRIURETIC PEPTIDE    EKG EKG Interpretation  Date/Time:  Wednesday March 24 2022 12:11:09 EST Ventricular Rate:  83 PR Interval:  126 QRS Duration: 86 QT Interval:  366 QTC Calculation: 430 R Axis:   50 Text Interpretation: Normal sinus rhythm Normal ECG Confirmed by Carmin Muskrat [phone]) on 03/24/2022 5:56:25 PM  Radiology DG Chest 2 View  Result Date: 03/24/2022 CLINICAL DATA:  Short of breath EXAM: CHEST - 2 VIEW COMPARISON:  None Available. FINDINGS: Normal mediastinum and cardiac silhouette. Normal pulmonary vasculature. No evidence of effusion, infiltrate, or pneumothorax. No acute bony abnormality. IMPRESSION: No acute cardiopulmonary process. Electronically Signed   By: Suzy Bouchard M.D.   On: 03/24/2022 13:06    Procedures Procedures    Medications Ordered in ED Medications  albuterol (VENTOLIN HFA) 108 (90 Base) MCG/ACT inhaler 2 puff (has no administration in time range)  azithromycin (ZITHROMAX) tablet 500 mg (has no administration in time range)  albuterol (PROVENTIL) (2.5 MG/3ML) 0.083% nebulizer solution 12 mL (12 mLs Nebulization Given 03/24/22 1841)  dexamethasone (DECADRON) injection 10 mg (10 mg Intravenous Given 03/24/22 1817)    ED Course/ Medical Decision Making/ A&P This patient with a Hx of HIV, smoking, hypertension presents to the ED for concern of dyspnea, weakness, nausea, vomiting, this involves an extensive number of treatment options, and is a complaint that carries with it a high risk of complications and morbidity.    The differential diagnosis includes pneumonia, viral syndrome,  bacteremia, sepsis   Social Determinants of Health:  HIV  Additional history obtained:  Additional history and/or information obtained from chart review, notable for HIV history   After the initial evaluation, orders, including: Bronchodilators monitoring labs were initiated.   Patient placed on Cardiac and Pulse-Oximetry Monitors. The patient was maintained on a cardiac monitor.  The cardiac monitored showed an rhythm of 95 sinus normal The patient was also maintained on pulse oximetry. The readings were typically 96% room air normal   On repeat evaluation of the patient improved 7:56 PM Following breathing treatment, continuous, the patient has markedly improved is awake, alert, sitting upright.  We discussed all findings at length. Lab Tests:  I personally interpreted labs.  The pertinent results include: COVID-negative, influenza negative, labs generally unremarkable  Imaging Studies ordered:  I independently visualized and interpreted imaging which showed no pneumonia on  x-ray I agree with the radiologist interpretation   Dispostion / Final MDM:  After consideration of the diagnostic results and the patient's response to treatment, this adult male presents with cough, congestion, fatigue past evaluation as above.  With risk profile including HIV, concern for immunocompromise state, patient was monitored continuously.  Here he improved markedly, the hospitalization was a consideration for the above, given his improvement, reassuring findings, no evidence for bacteremia, sepsis he was discharged in stable condition.  Final Clinical Impression(s) / ED Diagnoses Final diagnoses:  Bronchitis    Rx / DC Orders ED Discharge Orders          Ordered    azithromycin (ZITHROMAX) 250 MG tablet  Daily        03/24/22 1955    predniSONE (DELTASONE) 20 MG tablet  Daily with breakfast        03/24/22 Pollyann Samples, MD 03/24/22 1956

## 2022-03-24 NOTE — ED Notes (Signed)
Per lab need to recollect swab

## 2022-03-24 NOTE — ED Provider Triage Note (Signed)
Emergency Medicine Provider Triage Evaluation Note  John [name] , a 53 y.o. male  was evaluated in triage.  Pt complains of shortness of breath.  History of asthma.  Patient found to be in respiratory distress by EMS and given 2 g magnesium, 125 Solu-Medrol, 5 mg of Atrovent, 5 mg albuterol with improvement in symptoms.  Patient had a similar episode yesterday.  Patient has been out of his albuterol inhaler.  Admits to subjective fever.  No chest pain.  Denies lower extremity edema.  Admits to previous admissions for asthma.  No previous intubations.  Review of Systems  Positive: SOB Negative: Lower extremity edema  Physical Exam  BP (!) 136/94   Pulse 81   Temp 98.9 F (37.2 C) (Oral)   Resp 16   SpO2 95%  Gen:   Awake, no distress   Resp:  Normal effort  MSK:   Moves extremities without difficulty  Other:  wheeze  Medical Decision Making  Medically screening exam initiated at 12:37 PM.  Appropriate orders placed.  John [name] was informed that the remainder of the evaluation will be completed by another provider, this initial triage assessment does not replace that evaluation, and the importance of remaining in the ED until their evaluation is complete.  SOB, likely secondary to asthma Labs, CXR, RVP   Suzy Bouchard, [place] 03/24/22 1238

## 2022-04-20 ENCOUNTER — Other Ambulatory Visit: Payer: Medicaid Other

## 2022-04-20 ENCOUNTER — Other Ambulatory Visit: Payer: Self-pay | Admitting: Pharmacist

## 2022-04-20 ENCOUNTER — Other Ambulatory Visit: Payer: Self-pay

## 2022-04-20 DIAGNOSIS — B20 Human immunodeficiency virus [HIV] disease: Secondary | ICD-10-CM
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
## 2022-05-01 LAB — HIV-1 INTEGRASE GENOTYPE

## 2022-05-01 LAB — HIV-1 GENOTYPE: HIV-1 Genotype: DETECTED — AB

## 2022-05-01 LAB — HIV RNA, RTPCR W/R GT (RTI, PI,INT)
HIV 1 RNA Quant: 1480 copies/mL — ABNORMAL HIGH
HIV-1 RNA Quant, Log: 3.17 Log copies/mL — ABNORMAL HIGH

## 2022-05-03 ENCOUNTER — Other Ambulatory Visit: Payer: Self-pay

## 2022-05-03 ENCOUNTER — Ambulatory Visit (INDEPENDENT_AMBULATORY_CARE_PROVIDER_SITE_OTHER): Payer: Medicaid Other | Admitting: Family

## 2022-05-03 ENCOUNTER — Encounter: Payer: Self-pay | Admitting: Family

## 2022-05-03 VITALS — BP 126/78 | HR 89 | Temp 98.0°F | Wt 151.0 lb

## 2022-05-03 DIAGNOSIS — Z21 Asymptomatic human immunodeficiency virus [HIV] infection status: Secondary | ICD-10-CM

## 2022-05-03 DIAGNOSIS — Z0000 Encounter for general adult medical examination without abnormal findings: Secondary | ICD-10-CM | POA: Diagnosis not present

## 2022-05-03 DIAGNOSIS — F1990 Other psychoactive substance use, unspecified, uncomplicated: Secondary | ICD-10-CM | POA: Diagnosis not present

## 2022-05-03 DIAGNOSIS — F250 Schizoaffective disorder, bipolar type: Secondary | ICD-10-CM | POA: Diagnosis not present

## 2022-05-03 DIAGNOSIS — Z5900 Homelessness unspecified: Secondary | ICD-10-CM

## 2022-05-03 MED ORDER — TRAZODONE HCL 50 MG PO TABS: 50.0000 mg | ORAL_TABLET | Freq: Every evening | ORAL | 3 refills | Status: DC | PRN

## 2022-05-03 MED ORDER — QUETIAPINE FUMARATE ER 300 MG PO TB24: 300.0000 mg | ORAL_TABLET | Freq: Every day | ORAL | 3 refills | Status: DC

## 2022-05-03 MED ORDER — BIKTARVY 50-200-25 MG PO TABS: 1.0000 | ORAL_TABLET | Freq: Every day | ORAL | 3 refills | Status: DC

## 2022-05-03 NOTE — Patient Instructions (Signed)
Nice to see you.  Continue to take your medication daily as prescribed.  Refills have been sent to the pharmacy.  Plan for follow up in 1 months or sooner if needed with lab work on the same day.  Have a great day and stay safe!

## 2022-05-03 NOTE — Assessment & Plan Note (Signed)
Discussed importance of safe sexual practice and condom use. Condoms and STD testing offered.  Declines vaccinations. Due for routine dental care and colonoscopy. Hoepfully will be able to complete once mental health improves.

## 2022-05-03 NOTE — Assessment & Plan Note (Signed)
John [name] continues to use multiple substances and given that he is off of his psychiatric medications unlikely to stop which is unfortunately a major contributing factor to his ability to take his medications. Counseled on importance of cessation, although in the pre-contemplation stage and no intentions at the present time.

## 2022-05-03 NOTE — Assessment & Plan Note (Signed)
John [name] has poorly controlled virus secondary to being off medication since  his last office visit with viral load of 1080 and no new resistance of Genosure. Discussed importance of taking medications as prescribed in addition to routine follow up. Case management informs he has a better payor now to help manage his funds. Restart Biktarvy. Plan for follow up in 1 month or sooner if needed with lab work on the same day.

## 2022-05-03 NOTE — Assessment & Plan Note (Signed)
John [name] continues to be homeless despite having SSDI to help with his finances. Has new payor which will hopefully help. Continues to work with [facility] to get him connected to resources.

## 2022-05-03 NOTE — Assessment & Plan Note (Signed)
Joanna currently has positive symptoms hearing voices and has not been sleeping well as a result of being off his medications. No suicidal or homicidal ideations nor signs of psychosis. Restart quetiapine and trazodone. [facility] working on getting him in with Psychiatry.

## 2022-05-04 ENCOUNTER — Encounter (HOSPITAL_COMMUNITY): Payer: Self-pay

## 2022-05-04 ENCOUNTER — Other Ambulatory Visit: Payer: Self-pay

## 2022-05-04 ENCOUNTER — Observation Stay (HOSPITAL_COMMUNITY)
Admission: EM | Admit: 2022-05-04 | Discharge: 2022-05-05 | Disposition: A | Discharge status: Skilled Nursing Facility | Payer: Medicaid Other | Attending: Family Medicine | Admitting: Family Medicine

## 2022-05-04 ENCOUNTER — Emergency Department (HOSPITAL_COMMUNITY): Payer: Medicaid Other

## 2022-05-04 DIAGNOSIS — F259 Schizoaffective disorder, unspecified: Secondary | ICD-10-CM | POA: Diagnosis present

## 2022-05-04 DIAGNOSIS — Z21 Asymptomatic human immunodeficiency virus [HIV] infection status: Secondary | ICD-10-CM | POA: Diagnosis present

## 2022-05-04 DIAGNOSIS — J4551 Severe persistent asthma with (acute) exacerbation: Secondary | ICD-10-CM | POA: Diagnosis not present

## 2022-05-04 DIAGNOSIS — F1420 Cocaine dependence, uncomplicated: Secondary | ICD-10-CM | POA: Diagnosis not present

## 2022-05-04 DIAGNOSIS — F250 Schizoaffective disorder, bipolar type: Secondary | ICD-10-CM | POA: Diagnosis not present

## 2022-05-04 DIAGNOSIS — B20 Human immunodeficiency virus [HIV] disease: Secondary | ICD-10-CM | POA: Diagnosis not present

## 2022-05-04 DIAGNOSIS — R0602 Shortness of breath: Secondary | ICD-10-CM | POA: Diagnosis present

## 2022-05-04 DIAGNOSIS — J45901 Unspecified asthma with (acute) exacerbation: Secondary | ICD-10-CM | POA: Diagnosis present

## 2022-05-04 DIAGNOSIS — J4541 Moderate persistent asthma with (acute) exacerbation: Principal | ICD-10-CM | POA: Insufficient documentation

## 2022-05-04 DIAGNOSIS — Z87891 Personal history of nicotine dependence: Secondary | ICD-10-CM | POA: Diagnosis not present

## 2022-05-04 DIAGNOSIS — J45909 Unspecified asthma, uncomplicated: Secondary | ICD-10-CM | POA: Diagnosis present

## 2022-05-04 DIAGNOSIS — Z1152 Encounter for screening for COVID-19: Secondary | ICD-10-CM | POA: Diagnosis not present

## 2022-05-04 LAB — CBC WITH DIFFERENTIAL/PLATELET
Abs Immature Granulocytes: 0.01 10*3/uL (ref 0.00–0.07)
Basophils Absolute: 0 10*3/uL (ref 0.0–0.1)
Basophils Relative: 1 %
Eosinophils Absolute: 0.2 10*3/uL (ref 0.0–0.5)
Eosinophils Relative: 4 %
HCT: 42.9 % (ref 39.0–52.0)
Hemoglobin: 13.7 g/dL (ref 13.0–17.0)
Immature Granulocytes: 0 %
Lymphocytes Relative: 51 %
Lymphs Abs: 3 10*3/uL (ref 0.7–4.0)
MCH: 29.8 pg (ref 26.0–34.0)
MCHC: 31.9 g/dL (ref 30.0–36.0)
MCV: 93.5 fL (ref 80.0–100.0)
Monocytes Absolute: 0.6 10*3/uL (ref 0.1–1.0)
Monocytes Relative: 10 %
Neutro Abs: 2 10*3/uL (ref 1.7–7.7)
Neutrophils Relative %: 34 %
Platelets: 348 10*3/uL (ref 150–400)
RBC: 4.59 MIL/uL (ref 4.22–5.81)
RDW: 13.6 % (ref 11.5–15.5)
WBC: 5.8 10*3/uL (ref 4.0–10.5)
nRBC: 0 % (ref 0.0–0.2)

## 2022-05-04 LAB — BASIC METABOLIC PANEL
Anion gap: 11 (ref 5–15)
BUN: 9 mg/dL (ref 6–20)
CO2: 25 mmol/L (ref 22–32)
Calcium: 8.6 mg/dL — ABNORMAL LOW (ref 8.9–10.3)
Chloride: 101 mmol/L (ref 98–111)
Creatinine, Ser: 0.85 mg/dL (ref 0.61–1.24)
GFR, Estimated: >60 mL/min (ref >60)
Glucose, Bld: 91 mg/dL (ref 70–99)
Potassium: 4.9 mmol/L (ref 3.5–5.1)
Sodium: 137 mmol/L (ref 135–145)

## 2022-05-04 LAB — LACTATE DEHYDROGENASE: LDH: 245 U/L — ABNORMAL HIGH (ref 98–192)

## 2022-05-04 LAB — RESP PANEL BY RT-PCR (RSV, FLU A&B, COVID)  RVPGX2
Influenza A by PCR: NEGATIVE
Influenza B by PCR: NEGATIVE
Resp Syncytial Virus by PCR: NEGATIVE
SARS Coronavirus 2 by RT PCR: NEGATIVE

## 2022-05-04 MED ORDER — PREDNISONE 50 MG PO TABS
60.0000 mg | ORAL_TABLET | Freq: Every day | ORAL | Status: DC
  Administered 2022-05-05: 60 mg via ORAL
  Filled 2022-05-04: qty 1

## 2022-05-04 MED ORDER — SODIUM CHLORIDE 0.9 % IV SOLN
2.0000 g | INTRAVENOUS | Status: DC
  Administered 2022-05-04: 2 g via INTRAVENOUS
  Filled 2022-05-04: qty 20

## 2022-05-04 MED ORDER — ENOXAPARIN SODIUM 40 MG/0.4ML IJ SOSY
40.0000 mg | PREFILLED_SYRINGE | INTRAMUSCULAR | Status: DC
  Administered 2022-05-05: 40 mg via SUBCUTANEOUS
  Filled 2022-05-04: qty 0.4

## 2022-05-04 MED ORDER — ALBUTEROL SULFATE (2.5 MG/3ML) 0.083% IN NEBU
10.0000 mg | INHALATION_SOLUTION | Freq: Once | RESPIRATORY_TRACT | Status: AC
  Administered 2022-05-04: 10 mg via RESPIRATORY_TRACT
  Filled 2022-05-04: qty 12

## 2022-05-04 MED ORDER — LACTATED RINGERS IV BOLUS
1000.0000 mL | Freq: Once | INTRAVENOUS | Status: AC
  Administered 2022-05-04: 1000 mL via INTRAVENOUS

## 2022-05-04 MED ORDER — NICOTINE 14 MG/24HR TD PT24
14.0000 mg | MEDICATED_PATCH | Freq: Every day | TRANSDERMAL | Status: DC
  Administered 2022-05-04 – 2022-05-05 (×2): 14 mg via TRANSDERMAL
  Filled 2022-05-04 (×2): qty 1

## 2022-05-04 MED ORDER — ADULT MULTIVITAMIN W/MINERALS CH
1.0000 | ORAL_TABLET | Freq: Every day | ORAL | Status: DC
  Administered 2022-05-04 – 2022-05-05 (×2): 1 via ORAL
  Filled 2022-05-04 (×3): qty 1

## 2022-05-04 MED ORDER — MAGNESIUM SULFATE 2 GM/50ML IV SOLN
2.0000 g | Freq: Once | INTRAVENOUS | Status: AC
  Administered 2022-05-04: 2 g via INTRAVENOUS
  Filled 2022-05-04: qty 50

## 2022-05-04 MED ORDER — AZITHROMYCIN 250 MG PO TABS
500.0000 mg | ORAL_TABLET | Freq: Once | ORAL | Status: AC
  Administered 2022-05-04: 500 mg via ORAL
  Filled 2022-05-04: qty 2

## 2022-05-04 MED ORDER — ALBUTEROL SULFATE (2.5 MG/3ML) 0.083% IN NEBU
5.0000 mg | INHALATION_SOLUTION | RESPIRATORY_TRACT | Status: DC
  Administered 2022-05-04 (×2): 5 mg via RESPIRATORY_TRACT
  Filled 2022-05-04 (×2): qty 6

## 2022-05-04 MED ORDER — ALBUTEROL SULFATE (2.5 MG/3ML) 0.083% IN NEBU
5.0000 mg | INHALATION_SOLUTION | Freq: Once | RESPIRATORY_TRACT | Status: AC
  Administered 2022-05-04: 5 mg via RESPIRATORY_TRACT
  Filled 2022-05-04: qty 6

## 2022-05-04 MED ORDER — TRAZODONE HCL 50 MG PO TABS: 50.0000 mg | ORAL_TABLET | Freq: Every evening | ORAL | Status: DC | PRN

## 2022-05-04 MED ORDER — QUETIAPINE FUMARATE ER 300 MG PO TB24
300.0000 mg | ORAL_TABLET | Freq: Every day | ORAL | Status: DC
  Administered 2022-05-04: 300 mg via ORAL
  Filled 2022-05-04 (×2): qty 1

## 2022-05-04 MED ORDER — AZITHROMYCIN 500 MG PO TABS
250.0000 mg | ORAL_TABLET | Freq: Every day | ORAL | Status: DC
  Administered 2022-05-05: 250 mg via ORAL
  Filled 2022-05-04: qty 1

## 2022-05-04 MED ORDER — ALBUTEROL SULFATE (2.5 MG/3ML) 0.083% IN NEBU
2.5000 mg | INHALATION_SOLUTION | RESPIRATORY_TRACT | Status: DC
  Administered 2022-05-04 (×2): 2.5 mg via RESPIRATORY_TRACT
  Filled 2022-05-04 (×3): qty 3

## 2022-05-04 MED ORDER — BICTEGRAVIR-EMTRICITAB-TENOFOV 50-200-25 MG PO TABS
1.0000 | ORAL_TABLET | Freq: Every day | ORAL | Status: DC
  Administered 2022-05-04 – 2022-05-05 (×2): 1 via ORAL
  Filled 2022-05-04 (×3): qty 1

## 2022-05-04 NOTE — ED Notes (Signed)
ED TO INPATIENT HANDOFF REPORT  ED Nurse Name and Phone #: Leafy Ro [phone]  S Name/Age/Gender John [name] 52 y.o. male Room/Bed: 039C/039C  Code Status   Code Status: Full Code  Home/SNF/Other Home Patient oriented to: self, place, time, and situation Is this baseline? Yes   Triage Complete: Triage complete  Chief Complaint Asthma [J45.909]  Triage Note Pt BIB [facility] from home c/o [facility] x2 days and coughing. Per EMS, wheezing in all fields. Fire gave Albuterol and EMS gave solumedrol and duoneb. VSS, AO4  HX- asthma   Allergies Allergies  Allergen Reactions   Shellfish Allergy Anaphylaxis and Swelling    Level of Care/Admitting Diagnosis ED Disposition     ED Disposition  Admit   Condition  --   Comment  Hospital Area: [facility] [id number]  Level of Care: Med-Surg [16]  May place patient in observation at [facility] or [facility] if equivalent level of care is available:: No  Covid Evaluation: Confirmed COVID Negative  Diagnosis: Asthma [745110]  Admitting Physician: Eppie Gibson [id number]  Attending Physician: Wendy Poet, TODD D [[id number]]          B Medical/Surgery History Past Medical History:  Diagnosis Date   Acute hypoxemic respiratory failure ([place]) 05/07/2021   Adjustment disorder with depressed mood 10/28/2015   Alcoholism ([place])    Asthma    Bipolar disorder ([place])    with depression/anxiety   Cannabis use disorder, moderate, dependence ([place]) 04/02/2015   Chronic low back pain    Cocaine use disorder ([place]) 04/02/2015   Gout    HIV (human immunodeficiency virus infection) ([place])    "dx'd ~ 2 yr ago" (09/29/2012)   Homelessness    Hypertension    Polysubstance abuse ([place]) 04/11/2019   Past Surgical History:  Procedure Laterality Date   SKIN GRAFT FULL THICKNESS LEG Left ?   POSTERIOR LEFT LEG  AFTER DOG BITES     A IV Location/Drains/Wounds Patient Lines/Drains/Airways Status     Active Line/Drains/Airways      Name Placement date Placement time Site Days   Peripheral IV 05/04/22 20 G Anterior;Left;Proximal Forearm 05/04/22  0715  Forearm  less than 1            Intake/Output Last 24 hours No intake or output data in the 24 hours ending 05/04/22 1245  Labs/Imaging Results for orders placed or performed during the hospital encounter of 05/04/22 (from the past 48 hour(s))  Resp panel by RT-PCR (RSV, Flu A&B, Covid) Anterior Nasal Swab     Status: None   Collection Time: 05/04/22  7:12 AM   Specimen: Anterior Nasal Swab  Result Value Ref Range   SARS Coronavirus 2 by RT PCR NEGATIVE NEGATIVE   Influenza A by PCR NEGATIVE NEGATIVE   Influenza B by PCR NEGATIVE NEGATIVE    Comment: (NOTE) The Xpert Xpress SARS-CoV-2/FLU/RSV plus assay is intended as an aid in the diagnosis of influenza from Nasopharyngeal swab specimens and should not be used as a sole basis for treatment. Nasal washings and aspirates are unacceptable for Xpert Xpress SARS-CoV-2/FLU/RSV testing.  Fact Sheet for Patients: EntrepreneurPulse.com.au  Fact Sheet for Healthcare Providers: IncredibleEmployment.be  This test is not yet approved or cleared by the [place] FDA and has been authorized for detection and/or diagnosis of SARS-CoV-2 by FDA under an Emergency Use Authorization (EUA). This EUA will remain in effect (meaning this test can be used) for the duration of the COVID-19 declaration under Section 564(b)(1) of the Act,  21 U.S.C. section 360bbb-3(b)(1), unless the authorization is terminated or revoked.     Resp Syncytial Virus by PCR NEGATIVE NEGATIVE    Comment: (NOTE) Fact Sheet for Patients: EntrepreneurPulse.com.au  Fact Sheet for Healthcare Providers: IncredibleEmployment.be  This test is not yet approved or cleared by the [place] FDA and has been authorized for detection and/or diagnosis of SARS-CoV-2 by FDA under an  Emergency Use Authorization (EUA). This EUA will remain in effect (meaning this test can be used) for the duration of the COVID-19 declaration under Section 564(b)(1) of the Act, 21 U.S.C. section 360bbb-3(b)(1), unless the authorization is terminated or revoked.  Performed at [facility], [place] [address], [place], [place] [id number]   Basic metabolic panel     Status: Abnormal   Collection Time: 05/04/22  7:30 AM  Result Value Ref Range   Sodium 137 135 - 145 mmol/L   Potassium 4.9 3.5 - 5.1 mmol/L    Comment: HEMOLYSIS AT THIS LEVEL MAY AFFECT RESULT   Chloride 101 98 - 111 mmol/L   CO2 25 22 - 32 mmol/L   Glucose, Bld 91 70 - 99 mg/dL    Comment: Glucose reference range applies only to samples taken after fasting for at least 8 hours.   BUN 9 6 - 20 mg/dL   Creatinine, Ser 0.85 0.61 - 1.24 mg/dL   Calcium 8.6 (L) 8.9 - 10.3 mg/dL   GFR, Estimated >60 >60 mL/min    Comment: (NOTE) Calculated using the CKD-EPI Creatinine Equation (2021)    Anion gap 11 5 - 15    Comment: Performed at [name] [address], [place], [place] [address]  CBC with Differential     Status: None   Collection Time: 05/04/22  7:30 AM  Result Value Ref Range   WBC 5.8 4.0 - 10.5 K/uL   RBC 4.59 4.22 - 5.81 MIL/uL   Hemoglobin 13.7 13.0 - 17.0 g/dL   HCT 42.9 39.0 - 52.0 %   MCV 93.5 80.0 - 100.0 fL   MCH 29.8 26.0 - 34.0 pg   MCHC 31.9 30.0 - 36.0 g/dL   RDW 13.6 11.5 - 15.5 %   Platelets 348 150 - 400 K/uL   nRBC 0.0 0.0 - 0.2 %   Neutrophils Relative % 34 %   Neutro Abs 2.0 1.7 - 7.7 K/uL   Lymphocytes Relative 51 %   Lymphs Abs 3.0 0.7 - 4.0 K/uL   Monocytes Relative 10 %   Monocytes Absolute 0.6 0.1 - 1.0 K/uL   Eosinophils Relative 4 %   Eosinophils Absolute 0.2 0.0 - 0.5 K/uL   Basophils Relative 1 %   Basophils Absolute 0.0 0.0 - 0.1 K/uL   Immature Granulocytes 0 %   Abs Immature Granulocytes 0.01 0.00 - 0.07 K/uL    Comment: Performed at [facility],  [address] [place], [place] [address]  Lactate dehydrogenase     Status: Abnormal   Collection Time: 05/04/22  7:30 AM  Result Value Ref Range   LDH 245 (H) 98 - 192 U/L    Comment: HEMOLYSIS AT THIS LEVEL MAY AFFECT RESULT Performed at [facility], [place] [address], [place], [place] [address]    DG Chest 2 View  Result Date: 05/04/2022 CLINICAL DATA:  Shortness of breath EXAM: CHEST - 2 VIEW COMPARISON:  Radiograph 03/24/2022 FINDINGS: The heart size and mediastinal contours are within normal limits.No focal airspace disease. No pleural effusion or  pneumothorax.No acute osseous abnormality. Mild degenerative changes of the thoracic spine. IMPRESSION: No evidence of acute cardiopulmonary disease. Electronically Signed   By: Maurine Simmering M.D.   On: 05/04/2022 08:22    Pending Labs Unresulted Labs (From admission, onward)     Start     Ordered   05/04/22 1229  Pneumocystis smear by DFA  Once,   R        05/04/22 1228   05/04/22 1158  Expectorated Sputum Assessment w Gram Stain, Rflx to Resp Cult  Once,   R       Question:  Patient immune status  Answer:  Immunocompromised   05/04/22 1220   05/04/22 1153  Cd4/cd8 (t-helper/t-suppressor cell)  Once,   R        05/04/22 1220            Vitals/Pain Today's Vitals   05/04/22 1115 05/04/22 1130 05/04/22 1145 05/04/22 1200  BP: 119/83 124/87 132/82 122/86  Pulse: 93 96 97 88  Resp: (!) 21 17 (!) 24 (!) 22  Temp:      TempSrc:      SpO2: 95% 94% 99% 99%  Weight:      Height:      PainSc:        Isolation Precautions Airborne and Contact precautions  Medications Medications  albuterol (PROVENTIL) (2.5 MG/3ML) 0.083% nebulizer solution 5 mg (5 mg Nebulization Given 05/04/22 1159)  bictegravir-emtricitabine-tenofovir AF (BIKTARVY) 50-200-25 MG per tablet 1 tablet (has no administration in time range)  QUEtiapine (SEROQUEL XR) 24 hr tablet 300 mg (has no administration in time range)  traZODone (DESYREL) tablet 50 mg (has no  administration in time range)  magnesium sulfate IVPB 2 g 50 mL (has no administration in time range)  predniSONE (DELTASONE) tablet 60 mg (has no administration in time range)  enoxaparin (LOVENOX) injection 40 mg (has no administration in time range)  multivitamin with minerals tablet 1 tablet (has no administration in time range)  nicotine (NICODERM CQ - dosed in mg/24 hours) patch 14 mg (has no administration in time range)  cefTRIAXone (ROCEPHIN) 2 g in sodium chloride 0.9 % 100 mL IVPB (has no administration in time range)  azithromycin (ZITHROMAX) tablet 500 mg (has no administration in time range)  azithromycin (ZITHROMAX) tablet 250 mg (has no administration in time range)  albuterol (PROVENTIL) (2.5 MG/3ML) 0.083% nebulizer solution 10 mg (10 mg Nebulization Given 05/04/22 0724)  lactated ringers bolus 1,000 mL (0 mLs Intravenous Stopped 05/04/22 1203)  albuterol (PROVENTIL) (2.5 MG/3ML) 0.083% nebulizer solution 5 mg (5 mg Nebulization Given 05/04/22 0853)    Mobility walks     Focused Assessments Pulmonary Assessment Handoff:  Lung sounds:          R Recommendations: See Admitting Provider Note  Report given to:   Additional Notes: Pt has asthma, being admitted for asthma/wheezing/SOB, he was moved over here in my section about 3 minutes ago, currently eating

## 2022-05-04 NOTE — Assessment & Plan Note (Addendum)
On physical exam today patient sounds clear, no wheezes, has dry cough. Sputum sample still needs to be collected. Afebrile overnight. No leukocytosis - start prednisone 60 mg daily - discontinue CTX and Azithromycin - changed albuterol nebs from scheduled to PRN q4h  - Supplement O2 as needed to keep SpO2 > 92% - f/u gram stain, sputum culture

## 2022-05-04 NOTE — Assessment & Plan Note (Signed)
Supposed to be taking seroquel and trazodone but has been out ~1 year. - continue quetiapine and trazodone - TOC as above

## 2022-05-04 NOTE — Plan of Care (Signed)
  Problem: Nutrition: Goal: Adequate nutrition will be maintained Outcome: Progressing   Problem: Elimination: Goal: Will not experience complications related to urinary retention Outcome: Progressing   

## 2022-05-04 NOTE — Assessment & Plan Note (Addendum)
Smokes crack cocaine and marijuana several times weekly, last use 2/19. - TOC Consult as above

## 2022-05-04 NOTE — Progress Notes (Signed)
NEW ADMISSION NOTE New Admission Note:   Arrival Method: stretcher Mental Orientation: A&O x 4 Telemetry: box 16 Assessment: Completed Skin: intact IV: infusing Pain: denies Tubes: none Safety Measures: Safety Fall Prevention Plan has been given, discussed and signed Admission: Completed [facility] Orientation: Patient has been orientated to the room, unit and staff.  Family: not present  Orders have been reviewed and implemented. Will continue to monitor the patient. Call light has been placed within reach and bed alarm has been activated.   Praneeth Bussey Carlisle Beers, RN   No verbal or chat from ED for report only had ED note
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
## 2022-05-04 NOTE — Assessment & Plan Note (Signed)
Unfortunately tells me he has been out of [place] x1 year. Last CD4 count in December was 385. Has been <100 in the past. Complex social situation as currently homeless. Tells me he has a case manager who is helping him address his medication access issues.  - Will check a CD4 count - PCP Smear by DFA as above - Restart Biktarvy while in hospital - [facility] consult for medication access

## 2022-05-04 NOTE — ED Provider Notes (Signed)
[facility] Provider Note   CSN: [id number] Arrival date & time: 05/04/22  [id number]     History  Chief Complaint  Patient presents with   Shortness of Breath    John [name] is a 52 y.o. male.  52 year old male with history of HIV not on antiretrovirals, marijuana use, tobacco use, and asthma who presents emergency department with shortness of breath and productive cough for the past 2 days.  Says the cough has been productive of grayish sputum.  Also with fatigue.  Denies fevers.  No chills.  No chest pain.  No lower extremity swelling.  No history of intubations for his asthma.  Also thinks he may be exposed to mold and dust which is a trigger.  EMS gave the patient Solu-Medrol and DuoNeb.  Says he has been out of his antiretrovirals Systems developer) for over a year.         Home Medications Prior to Admission medications   Medication Sig Start Date End Date Taking? Authorizing Provider  albuterol (VENTOLIN HFA) 108 (90 Base) MCG/ACT inhaler Inhale 2 puffs into the lungs every 4 (four) hours as needed for wheezing or shortness of breath. Patient not taking: Reported on 05/03/2022 11/26/21   Little Ishikawa, MD  bictegravir-emtricitabine-tenofovir AF (BIKTARVY) 50-200-25 MG TABS tablet Take 1 tablet by mouth daily. Patient not taking: Reported on 05/04/2022 05/03/22   Golden Circle, FNP  gabapentin (NEURONTIN) 100 MG capsule Take 2 capsules (200 mg total) by mouth 3 (three) times daily. Patient not taking: Reported on 05/04/2022 03/06/22 04/05/22  Helane Gunther, MD  pantoprazole (PROTONIX) 40 MG tablet Take 1 tablet (40 mg total) by mouth daily. Patient not taking: Reported on 05/04/2022 03/07/22 04/06/22  Helane Gunther, MD  predniSONE (DELTASONE) 20 MG tablet Take 2 tablets (40 mg total) by mouth daily with breakfast. For the next four days Patient not taking: Reported on 05/03/2022 03/24/22   Carmin Muskrat, MD  QUEtiapine (SEROQUEL  XR) 300 MG 24 hr tablet Take 1 tablet (300 mg total) by mouth at bedtime. Patient not taking: Reported on 05/04/2022 05/03/22 08/31/22  Golden Circle, FNP  traZODone (DESYREL) 50 MG tablet Take 1 tablet (50 mg total) by mouth at bedtime as needed for sleep (sleep difficulty). Patient not taking: Reported on 05/04/2022 05/03/22 08/31/22  Golden Circle, FNP  dicyclomine (BENTYL) 20 MG tablet Take 1 tablet (20 mg total) by mouth 2 (two) times daily. 01/08/17 01/03/20  Palumbo, April, MD  sucralfate (CARAFATE) 1 GM/10ML suspension Take 10 mLs (1 g total) by mouth 4 (four) times daily -  with meals and at bedtime. Patient not taking: Reported on 10/21/2017 01/08/17 09/03/18  Palumbo, April, MD      Allergies    Shellfish allergy    Review of Systems   Review of Systems  Physical Exam Updated Vital Signs BP (!) 135/94 (BP Location: Right Arm)   Pulse 88   Temp 98.5 F (36.9 C) (Oral)   Resp 18   Ht 5' 9"$  (1.753 m)   Wt 68.5 kg   SpO2 100%   BMI 22.30 kg/m  Physical Exam Vitals and nursing note reviewed.  Constitutional:      General: He is not in acute distress.    Appearance: He is well-developed.     Comments: Watching television.  Speaking in full sentences.  Nebulizer going  HENT:     Head: Normocephalic and atraumatic.     Right Ear: External ear  normal.     Left Ear: External ear normal.     Nose: Congestion present.  Eyes:     Extraocular Movements: Extraocular movements intact.     Conjunctiva/sclera: Conjunctivae normal.     Pupils: Pupils are equal, round, and reactive to light.  Cardiovascular:     Rate and Rhythm: Normal rate and regular rhythm.     Heart sounds: Normal heart sounds.  Pulmonary:     Effort: Pulmonary effort is normal. No respiratory distress.     Breath sounds: Wheezing (Diffuse expiratory) present.  Abdominal:     General: There is no distension.     Palpations: Abdomen is soft. There is no mass.     Tenderness: There is no abdominal  tenderness. There is no guarding.  Musculoskeletal:     Cervical back: Normal range of motion and neck supple.     Right lower leg: No edema.     Left lower leg: No edema.  Skin:    General: Skin is warm and dry.  Neurological:     Mental Status: He is alert. Mental status is at baseline.  Psychiatric:        Mood and Affect: Mood normal.        Behavior: Behavior normal.     ED Results / Procedures / Treatments   Labs (all labs ordered are listed, but only abnormal results are displayed) Labs Reviewed  BASIC METABOLIC PANEL - Abnormal; Notable for the following components:      Result Value   Calcium 8.6 (*)    All other components within normal limits  LACTATE DEHYDROGENASE - Abnormal; Notable for the following components:   LDH 245 (*)    All other components within normal limits  RESP PANEL BY RT-PCR (RSV, FLU A&B, COVID)  RVPGX2  EXPECTORATED SPUTUM ASSESSMENT W GRAM STAIN, RFLX TO RESP C  PNEUMOCYSTIS JIROVECI SMEAR BY DFA  CBC WITH DIFFERENTIAL/PLATELET  CD4/CD8 (T-HELPER/T-SUPPRESSOR CELL)    EKG EKG Interpretation  Date/Time:  Tuesday May 04 2022 06:56:58 EST Ventricular Rate:  77 PR Interval:  134 QRS Duration: 85 QT Interval:  388 QTC Calculation: 440 R Axis:   -7 Text Interpretation: Sinus rhythm Anterior infarct, old When compared to 03/24/22 poor r-wave progression present Confirmed by Margaretmary Eddy [id number]) on 05/04/2022 7:04:00 AM  Radiology DG Chest 2 View  Result Date: 05/04/2022 CLINICAL DATA:  Shortness of breath EXAM: CHEST - 2 VIEW COMPARISON:  Radiograph 03/24/2022 FINDINGS: The heart size and mediastinal contours are within normal limits.No focal airspace disease. No pleural effusion or pneumothorax.No acute osseous abnormality. Mild degenerative changes of the thoracic spine. IMPRESSION: No evidence of acute cardiopulmonary disease. Electronically Signed   By: Maurine Simmering M.D.   On: 05/04/2022 08:22    Procedures Procedures    Medications Ordered in ED Medications  bictegravir-emtricitabine-tenofovir AF (BIKTARVY) 50-200-25 MG per tablet 1 tablet (has no administration in time range)  QUEtiapine (SEROQUEL XR) 24 hr tablet 300 mg (has no administration in time range)  traZODone (DESYREL) tablet 50 mg (has no administration in time range)  predniSONE (DELTASONE) tablet 60 mg (has no administration in time range)  enoxaparin (LOVENOX) injection 40 mg (has no administration in time range)  multivitamin with minerals tablet 1 tablet (1 tablet Oral Given 05/04/22 1426)  nicotine (NICODERM CQ - dosed in mg/24 hours) patch 14 mg (14 mg Transdermal Patch Applied 05/04/22 1309)  cefTRIAXone (ROCEPHIN) 2 g in sodium chloride 0.9 % 100 mL IVPB (0 g Intravenous  Stopped 05/04/22 1400)  azithromycin (ZITHROMAX) tablet 250 mg (has no administration in time range)  albuterol (PROVENTIL) (2.5 MG/3ML) 0.083% nebulizer solution 2.5 mg (has no administration in time range)  albuterol (PROVENTIL) (2.5 MG/3ML) 0.083% nebulizer solution 10 mg (10 mg Nebulization Given 05/04/22 0724)  lactated ringers bolus 1,000 mL (0 mLs Intravenous Stopped 05/04/22 1203)  albuterol (PROVENTIL) (2.5 MG/3ML) 0.083% nebulizer solution 5 mg (5 mg Nebulization Given 05/04/22 0853)  magnesium sulfate IVPB 2 g 50 mL (2 g Intravenous New Bag/Given 05/04/22 1424)  azithromycin (ZITHROMAX) tablet 500 mg (500 mg Oral Given 05/04/22 1309)    ED Course/ Medical Decision Making/ A&P Clinical Course as of 05/04/22 1559  Tue May 04, 2022  1124 Dr Claiborne Billings [RP]    Clinical Course User Index [RP] Fransico Meadow, MD                             Medical Decision Making Amount and/or Complexity of Data Reviewed Labs: ordered. Radiology: ordered.  Risk Prescription drug management. Decision regarding hospitalization.   Skipper Machin is a 52 y.o. male with comorbidities that complicate the patient evaluation including HIV not on antiretrovirals and asthma  without intubations with current tobacco use who presents to the emergency department with cough and shortness of breath  Initial Ddx:  URI, asthma exacerbation, pneumonia, pneumocystis pneumonia  MDM:  Feel the patient may potentially have an asthma exacerbation at this time triggered by pulmonary infection.  Will obtain blood work with his history of HIV and also obtain chest x-ray with COVID and flu to evaluate for infection.  Has already been given steroids by EMS.  Is overall very well-appearing so we will give additional nebulizer at this time.  Plan:  Labs LDH Chest x-ray COVID/flu Nebulizer IV fluids  ED Summary/Re-evaluation:  Patient reassessed and required multiple nebulizer treatments for his asthma and had persistent wheezing.  Reported that his breathing had not gone back to baseline.  Above workup was unremarkable aside from elevated LDH but chest x-ray without evidence of infiltrate so feel the PCP pneumonia highly unlikely.  Admitted to medicine for further management of his asthma exacerbation.  This patient presents to the ED for concern of complaints listed in HPI, this involves an extensive number of treatment options, and is a complaint that carries with it a high risk of complications and morbidity. Disposition including potential need for admission considered.   Dispo: Admit to Floor  Additional history obtained from EMS Records reviewed ED Visit Notes The following labs were independently interpreted: Chemistry and CBC and show no acute abnormality I independently reviewed the following imaging with scope of interpretation limited to determining acute life threatening conditions related to emergency care: Chest x-ray and agree with the radiologist interpretation with the following exceptions: none I personally reviewed and interpreted cardiac monitoring: normal sinus rhythm  I personally reviewed and interpreted the pt's EKG: see above for interpretation  I have  reviewed the patients home medications and made adjustments as needed Consults: Hospitalist Social Determinants of health:  MJ use and tobacco use  Final Clinical Impression(s) / ED Diagnoses Final diagnoses:  Moderate asthma with exacerbation, unspecified whether persistent    Rx / DC Orders ED Discharge Orders     None         Fransico Meadow, MD 05/04/22 [phone]

## 2022-05-04 NOTE — ED Triage Notes (Signed)
Pt BIB [facility] from home c/o [facility] x2 days and coughing. Per EMS, wheezing in all fields. Fire gave Albuterol and EMS gave solumedrol and duoneb. VSS, AO4  HX- asthma

## 2022-05-04 NOTE — Hospital Course (Signed)
Kartikeya Eberle is a 52 y.o. male presenting with shortness of breath, cough, and fever secondary to asthma exacerbation.  Asthma With EMS patient was noted to be dyspneic and wheezing, given solumedrol and then multiple breathing treatments in the ED. Trigger was likely infectious in nature as patient was febrile prior to onset of symptoms. CXR was found to be negative for pneumonia. There was also concern for PCP infection in the setting of HIV but CD4 was within normal limits. During hospitalization patient was given prednisone as well as ceftriaxone and azithromycin for broad spectrum antibiotics.  HIV Patient was reportedly out of [place] x 1 year but this conflicts with recent documentation indicating medicine was prescribed in the past month. CD4 came back within normal limits. Attempted to obtain sputum sample for gram stain, C&S but patient could not provide sample given reduction of sputum production.  Issues for PCP follow-up: HIV regimen adherence

## 2022-05-05 ENCOUNTER — Other Ambulatory Visit (HOSPITAL_COMMUNITY): Payer: Self-pay

## 2022-05-05 ENCOUNTER — Other Ambulatory Visit: Payer: Self-pay

## 2022-05-05 DIAGNOSIS — J45901 Unspecified asthma with (acute) exacerbation: Secondary | ICD-10-CM | POA: Diagnosis not present

## 2022-05-05 LAB — CBC
HCT: 36.3 % — ABNORMAL LOW (ref 39.0–52.0)
Hemoglobin: 11.7 g/dL — ABNORMAL LOW (ref 13.0–17.0)
MCH: 29.8 pg (ref 26.0–34.0)
MCHC: 32.2 g/dL (ref 30.0–36.0)
MCV: 92.6 fL (ref 80.0–100.0)
Platelets: 340 10*3/uL (ref 150–400)
RBC: 3.92 MIL/uL — ABNORMAL LOW (ref 4.22–5.81)
RDW: 13.7 % (ref 11.5–15.5)
WBC: 8 10*3/uL (ref 4.0–10.5)
nRBC: 0 % (ref 0.0–0.2)

## 2022-05-05 LAB — CD4/CD8 (T-HELPER/T-SUPPRESSOR CELL)
CD4 absolute: 525 /uL (ref 400–1790)
CD4%: 20.87 % — ABNORMAL LOW (ref 33–65)
CD8 T Cell Abs: 1259 /uL — ABNORMAL HIGH (ref 190–1000)
CD8tox: 50.05 % — ABNORMAL HIGH (ref 12–40)
Ratio: 0.42 — ABNORMAL LOW (ref 1.0–3.0)
Total lymphocyte count: 2515 /uL (ref 1000–4000)

## 2022-05-05 LAB — COMPREHENSIVE METABOLIC PANEL
ALT: 13 U/L (ref 0–44)
AST: 18 U/L (ref 15–41)
Albumin: 3 g/dL — ABNORMAL LOW (ref 3.5–5.0)
Alkaline Phosphatase: 78 U/L (ref 38–126)
Anion gap: 10 (ref 5–15)
BUN: 22 mg/dL — ABNORMAL HIGH (ref 6–20)
CO2: 25 mmol/L (ref 22–32)
Calcium: 8.7 mg/dL — ABNORMAL LOW (ref 8.9–10.3)
Chloride: 105 mmol/L (ref 98–111)
Creatinine, Ser: 0.92 mg/dL (ref 0.61–1.24)
GFR, Estimated: >60 mL/min (ref >60)
Glucose, Bld: 114 mg/dL — ABNORMAL HIGH (ref 70–99)
Potassium: 3.6 mmol/L (ref 3.5–5.1)
Sodium: 140 mmol/L (ref 135–145)
Total Bilirubin: 0.6 mg/dL (ref 0.3–1.2)
Total Protein: 5.8 g/dL — ABNORMAL LOW (ref 6.5–8.1)

## 2022-05-05 MED ORDER — PREDNISONE 20 MG PO TABS
60.0000 mg | ORAL_TABLET | Freq: Every day | ORAL | 0 refills | Status: AC
  Filled 2022-05-05: qty 9, 3d supply, fill #0

## 2022-05-05 MED ORDER — BUDESONIDE-FORMOTEROL FUMARATE 80-4.5 MCG/ACT IN AERO
2.0000 | INHALATION_SPRAY | Freq: Two times a day (BID) | RESPIRATORY_TRACT | 2 refills | Status: DC
  Filled 2022-05-05: qty 10.2, 30d supply, fill #0

## 2022-05-05 MED ORDER — NICOTINE 14 MG/24HR TD PT24
14.0000 mg | MEDICATED_PATCH | Freq: Every day | TRANSDERMAL | 0 refills | Status: DC
  Filled 2022-05-05: qty 28, 28d supply, fill #0

## 2022-05-05 MED ORDER — ALBUTEROL SULFATE (2.5 MG/3ML) 0.083% IN NEBU: 2.5000 mg | INHALATION_SOLUTION | RESPIRATORY_TRACT | Status: DC | PRN

## 2022-05-05 MED ORDER — BIKTARVY 50-200-25 MG PO TABS
1.0000 | ORAL_TABLET | Freq: Every day | ORAL | 0 refills | Status: DC
  Filled 2022-05-05: qty 30, 30d supply, fill #0

## 2022-05-05 NOTE — Progress Notes (Signed)
   Daily Progress Note Intern Pager: [phone]  Patient name: John [name] Medical record number: [id number] Date of birth: 1970-09-17 Age: 52 y.o. Gender: male  Primary Care Provider: Pcp, No Primary Care Provider: Pcp, No Consultants: None Code Status: Full   Pt Overview and Major Events to Date:  2/20 admitted  Assessment and Plan: John [name] is a 52 y.o. male presenting with Shortness of breath, cough, and fever.   * Asthma On physical exam today patient sounds clear, no wheezes, has dry cough. Sputum sample still needs to be collected. Afebrile overnight. No leukocytosis - start prednisone 60 mg daily - discontinue CTX and Azithromycin - changed albuterol nebs from scheduled to PRN q4h  - Supplement O2 as needed to keep SpO2 > 92% - f/u gram stain, sputum culture  HIV (human immunodeficiency virus infection) ([place]) Last CD4 count in December was 385. Has been <100 in the past. Complex social situation as currently homeless. CD4 wnl - continue Biktarvy - TOC consult for medication access   Cocaine use disorder, severe, dependence ([place]) Smokes crack cocaine and marijuana several times weekly, last use 2/19. - TOC Consult as above   Schizoaffective disorder ([place]) Supposed to be taking seroquel and trazodone but has been out ~1 year. - continue quetiapine and trazodone - TOC as above   FEN/GI: Regular VTE Prophylaxis: Lovenox   Disposition: Med-Surg  Subjective:  Patient still does not feel well, but notes he feels improved from yesterday. He has been coughing up less productive sputum and is aware a sample still needs to be collected. He endorses some mild shortness of breath earlier but not at the time of evaluation.  Objective: Temp:  [97.3 F (36.3 C)-98.6 F (37 C)] 98 F (36.7 C) (02/21 0539) Pulse Rate:  [71-106] 71 (02/21 0539) Resp:  [18-22] 18 (02/21 0539) BP: (103-143)/(60-94) 106/82 (02/21 0539) SpO2:  [96 %-100 %] 99 % (02/21 0539) Physical  Exam: General: in no acute distress HEENT: normocephalic and atraumatic Respiratory: clear to auscultation bilaterally posteriorly, non-labored breathing, and on RA Extremities: moving all extremities spontaneously Gastrointestinal: non-tender and non-distended Cardiovascular: regular rate  Laboratory: Most recent CBC Lab Results  Component Value Date   WBC 8.0 05/05/2022   HGB 11.7 (L) 05/05/2022   HCT 36.3 (L) 05/05/2022   MCV 92.6 05/05/2022   PLT 340 05/05/2022   Most recent BMP    Latest Ref Rng & Units 05/05/2022    3:15 AM  BMP  Glucose 70 - 99 mg/dL 114   BUN 6 - 20 mg/dL 22   Creatinine 0.61 - 1.24 mg/dL 0.92   Sodium 135 - 145 mmol/L 140   Potassium 3.5 - 5.1 mmol/L 3.6   Chloride 98 - 111 mmol/L 105   CO2 22 - 32 mmol/L 25   Calcium 8.9 - 10.3 mg/dL 8.7    Camelia Phenes, MD 05/05/2022, 11:57 AM  PGY-1, [place] Intern pager: [phone], text pages welcome Secure chat group [place]

## 2022-05-05 NOTE — Progress Notes (Signed)
FMTS Interim Progress Note  S: Asleep, resting comfortably in bed.  O: BP 103/66 (BP Location: Left Arm)   Pulse (!) 106   Temp (!) 97.3 F (36.3 C) (Oral)   Resp 18   Ht 5' 9"$  (1.753 m)   Wt 68.5 kg   SpO2 96%   BMI 22.30 kg/m   General: Asleep, resting comfortably in bed  A/P: Tachycardia Patient had multiple episodes of tachycardia between 100-113.  Continue to monitor at this time.  Asthma Continue treatment plan as outlined in H&P by Dr. Joelyn Oms on 2/20.  No changes made overnight.  HIV PCP smear and sputum culture still need collection.  Still pending CD4 count.  Leslie Dales, DO 05/05/2022, 2:01 AM PGY-1, [facility] Medicine Service pager [phone]

## 2022-05-05 NOTE — Discharge Instructions (Addendum)
Dear John [name],  Thank you for letting us participate in your care. You were hospitalized for and diagnosed with an Asthma exacerbation. You were treated with with albuterol and steroids. You will be sent home with a daily inhaler (Symbicort), to help prevent this from happening again, as well as more steroids to take at home.  POST-HOSPITAL & CARE INSTRUCTIONS Take your Symbicort daily Use your albuterol only as needed for: shortness of breath, wheezing, difficulty breathing Can repeat albuterol inhaler every 15-20 min for 3 attempts If not getting relief with albuterol use, seek emergency medical care Take your steroids (Prednisone) for the next 3 days, once a day Make follow up appointment to be seen by your Primary Doctor Go to your follow up appointments (listed below)   DOCTOR'S APPOINTMENT   Future Appointments  Date Time Provider [place]  06/01/2022 10:30 AM Golden Circle, FNP [facility]    Follow-up Information     Tommy Medal, Lavell Islam, MD Follow up.   Specialty: Infectious Diseases Contact information: [address] [address] [phone]                 Take care and be well!  [facility]  [place], [place] [address] [phone]   Substance Use Resources    Outpatient Providers   [facility] ([facility]) Group and individual counseling. [address]  [place], [place] [address] [phone] [place]: [phone]  [place]: [phone] Medicaid and uninsured.   The [place] IOP groups multiple times per week. [place], [place], [place] [address] [phone] Takes Medicaid and other insurances.   [facility]  Chemical Dependency Intensive Outpatient Program (IOP) [address] [place], [place] [address] [phone] Takes Pharmacist, community  and [place].   [facility]  IOP and Partial Hospitalization Program  [address].  [place], [place] [address] [phone] Private Insurance, [place] only for partial hospitalization     [facility] ([place]) IOP, individual counseling, medication management [place], [place] [address] [phone] Medicaid and [facility]  [place] [address]  [place], [place] [address] [phone] Private Insurance and [facility] [address]  [place], [place] [address] [phone] Private Insurance, [place], and Self Pay   [facility]: Methadone Clinic  [place], [place] [address] [facility]  [address]  [place], [place] [address] [phone]  [facility]  [address] [place], [place] [address] [phone]           Residential Treatment Programs  [facility] ([place], [place] [address] [phone] or [phone] Detox and Residential Rehab 14 days (Medicare, Medicaid, private insurance, and self pay)  [facility] [place], [place] [address] [phone] Detox (self Pay and Medicaid Limited availability) Rehab Only Male (Medicare, [place], and Self Pay)  [facility] [place] [address] [place], [place] [address] [phone] or [phone] Private Insurance only  [place]  [place].  [place], [place] [address] [phone] Treatment Only, must make assessment appointment, and must be sober for assessment appointment. Self pay, Medicare A and B, [facility], must  be [facility] resident.      Residential Treatment [place] , [place]  [phone] [employer],  [place], [place]. They offer assistance with transportation.   [facility] [address] [place],  [place], [place] [address] [phone] [facility] No insurance     [facility] [place], [place] [address] [phone] No pending legal charges, Long-term work program  [place] [place]: [facility]  [place], [place] [address] [address], [place], [place] [address] Residential treatment (takes people on Methadone/Suboxone)  Medicaid and uninsured  [facility]  [address], [place], [place] [address] [phone] or [phone] [employer] Insurance Only [place] [phone] Private Insurance Males/Females, call to make referrals, multiple facilities   [facility] [address],  [place], [place] [address]  [phone] Men Only Upfront Fee [facility] [address]      [facility] Women's Program: [facility] [place], [place] [address] [phone]  [place], [place] [address] [phone] (f)  [place], [place] [address] [phone] (f)       Syringe Services Program Due to COVID-19, syringe services programs are likely operating under different hours with limited or no fixed site hours. Some programs may not be operating at all. Please contact the program directly using the phone numbers provided below to see if they are still operating under COVID-19.  [facility] Solution to the Opioid Problem (GCSTOP) Fixed; mobile; peer-based [place] Aver [phone] [email or web address] Fixed site exchange at [facility], [place]. [place], [place] [address] on Wednesdays (2:00 - 5:00 pm) and Thursdays (4:00 - 8:00 pm). Pop-up mobile exchange locations: [employer] and [employer], [address] [place], [place] [address] on Tuesdays (11:00 am - 1:00 pm) and Fridays (11:00 am - 1:00 pm) [address], [place], [place] [address] on Tuesdays (2:00 - 4:00 pm) and Fridays (2:00 - 4:00 pm) [facility] - also serves [place] and [employer] [place] [employer] Fixed; mobile; peer-based Rosemary Holms [phone] [email or web address] [address] [place], [place] [address] Delivery and outreach available in [place] and [place], please call for more information. Monday, Tuesday: 1:00 -7:00 pm Thursday: 4:00 pm - 8:00 pm Friday: 1:00 pm - 8:00 pm)

## 2022-05-05 NOTE — TOC Progression Note (Addendum)
Transition of Care [facility]) - Progression Note    Patient Details  Name: John [name] MRN: [id number] Date of Birth: 02/03/1971  Transition of [facility]) CM/SW Contact  Tom-Johnson, Renea Ee, RN Phone Number: 05/05/2022, 3:22 PM  Clinical Narrative:     CM spoke with patient about PCP needs. Patient chose Internal Medicine, CM called and scheduled hosp f/u and info on AVS.  CM consulted for Medication Assistance for [place], charted with [place] as patient has [place] Medicaid and should pay for medication. Per pharmacist, patient had med recently filled and CM spoke with patient about it and patient states [facility] [address] but [facility] did not pick [facility] up. [place] [facility] so they can fill it here. Patient will be returning to [facility] at discharge. LCSW following with discharge needs.    13:50- updated by [facility] pharmacist that [facility] was called and they confirmed Medication were picked up yesterday. Patient denied picking up medication.  Cab voucher given to RN, patient signed  Buyer, retail and Release of Liability form, placed in chart.  No further CM needs noted.    Expected Discharge Plan and Services         Expected Discharge Date: 05/05/22                                     Social Determinants of Health (SDOH) Interventions SDOH Screenings   Food Insecurity: Food Insecurity Present (05/04/2022)  Housing: High Risk (05/04/2022)  Transportation Needs: Unmet Transportation Needs (05/04/2022)  Utilities: At Risk (05/04/2022)  Alcohol Screen: Low Risk  (02/26/2022)  Depression (PHQ2-9): Low Risk  (05/03/2022)  Recent Concern: Depression (PHQ2-9) - High Risk (02/25/2022)  Financial Resource Strain: High Risk (05/22/2020)  Physical Activity: Inactive (05/22/2020)  Social Connections: Moderately Isolated (05/22/2020)  Stress: Stress Concern Present (05/22/2020)  Tobacco Use: High Risk (05/04/2022)    Readmission  Risk Interventions     No data to display

## 2022-05-05 NOTE — Discharge Summary (Incomplete)
[facility] Discharge Summary  Patient name: John [name] Medical record number: [id number] Date of birth: [birth year] Age: 52 y.o. Gender: male Date of Admission: 05/04/2022  Date of Discharge: *** Admitting Physician: Eppie Gibson, MD  Primary Care Provider: Pcp, No Consultants: ***  Indication for Hospitalization: ***  Discharge Diagnoses/Problem List:  Principal Problem for Admission: *** Other Problems addressed during stay:  Principal Problem:   Asthma Active Problems:   HIV (human immunodeficiency virus infection) ([place])   Cocaine use disorder, severe, dependence ([place])   Schizoaffective disorder [facility])    Brief Hospital Course:  John [name] is a 52 y.o. male presenting with shortness of breath, cough, and fever secondary to asthma exacerbation.  Asthma With EMS patient was noted to be dyspneic and wheezing, given solumedrol and then multiple breathing treatments in the ED. Trigger was likely infectious in nature as patient was febrile prior to onset of symptoms. CXR was found to be negative for pneumonia. There was also concern for PCP infection in the setting of HIV but CD4 was within normal limits. During hospitalization patient was given prednisone as well as ceftriaxone and azithromycin for broad spectrum antibiotics.  HIV Patient was reportedly out of [place] x 1 year but this conflicts with recent documentation indicating medicine was prescribed in the past month. CD4 came back within normal limits. Attempted to obtain sputum sample for gram stain, C&S but patient could not provide sample given reduction of sputum production.  Disposition: ***  Discharge Condition: ***  Issues for Follow Up:  1. ***  Discharge Exam:  Vitals:   05/05/22 0007 05/05/22 0539  BP: 103/66 106/82  Pulse: (!) 106 71  Resp: 18 18  Temp: (!) 97.3 F (36.3 C) 98 F (36.7 C)  SpO2: 96% 99%   ***  Significant Procedures: ***  Significant Labs  and Imaging:  Recent Labs  Lab 05/04/22 0730 05/05/22 0315  WBC 5.8 8.0  HGB 13.7 11.7*  HCT 42.9 36.3*  PLT 348 340   Recent Labs  Lab 05/04/22 0730 05/05/22 0315  NA 137 140  K 4.9 3.6  CL 101 105  CO2 25 25  GLUCOSE 91 114*  BUN 9 22*  CREATININE 0.85 0.92  CALCIUM 8.6* 8.7*  ALKPHOS  --  78  AST  --  18  ALT  --  13  ALBUMIN  --  3.0*    *** Pertinent Imaging ***   Results/Tests Pending at Time of Discharge: ***  Discharge Medications:  Allergies as of 05/05/2022       Reactions   Shellfish Allergy Anaphylaxis, Swelling        Medication List     TAKE these medications    Biktarvy 50-200-25 MG Tabs tablet Generic drug: bictegravir-emtricitabine-tenofovir AF Take 1 tablet by mouth daily. Start taking on: May 06, 2022   budesonide-formoterol 80-4.5 MCG/ACT inhaler Commonly known as: Symbicort Inhale 2 puffs into the lungs in the morning and at bedtime.   gabapentin 100 MG capsule Commonly known as: NEURONTIN Take 2 capsules (200 mg total) by mouth 3 (three) times daily.   nicotine 14 mg/24hr patch Commonly known as: NICODERM CQ - dosed in mg/24 hours Place 1 patch (14 mg total) onto the skin daily. Start taking on: May 06, 2022   pantoprazole 40 MG tablet Commonly known as: PROTONIX Take 1 tablet (40 mg total) by mouth daily.   predniSONE 20 MG tablet Commonly known as: DELTASONE Take 3 tablets (60 mg total)  by mouth daily with breakfast for 3 days. Start taking on: May 06, 2022 What changed:  how much to take additional instructions   QUEtiapine 300 MG 24 hr tablet Commonly known as: SEROQUEL XR Take 1 tablet (300 mg total) by mouth at bedtime.   traZODone 50 MG tablet Commonly known as: DESYREL Take 1 tablet (50 mg total) by mouth at bedtime as needed for sleep (sleep difficulty).   Ventolin HFA 108 (90 Base) MCG/ACT inhaler Generic drug: albuterol Inhale 2 puffs into the lungs every 4 (four) hours as needed for  wheezing or shortness of breath.        Discharge Instructions: Please refer to Patient Instructions section of EMR for full details.  Patient was counseled important signs and symptoms that should prompt return to medical care, changes in medications, dietary instructions, activity restrictions, and follow up appointments.   Follow-Up Appointments:  Follow-up Information     Tommy Medal, Lavell Islam, MD Follow up.   Specialty: Infectious Diseases Contact information: [address] [place] [address] [phone]                 Colletta Maryland, MD 05/05/2022, 2:13 PM PGY-***, [place]

## 2022-05-05 NOTE — TOC Transition Note (Signed)
Transition of Care [facility]) - CM/SW Discharge Note   Patient Details  Name: John [name] MRN: [id number] Date of Birth: [birth year]  Transition of Care [facility]) CM/SW Contact:  Milinda Antis, [place] Phone Number: 05/05/2022, 4:01 PM   Clinical Narrative:    Patient will DC to: To family members home Anticipated DC date: 05/05/2022 Family notified: Patient alert and oriented Transport by: Cab   Per MD patient ready for DC to the aunt's home at [address] in [place], [place].    RN and patient notified of DC.  Substance use resources were added to the AVS.  A taxi will transport the patient.   CSW will sign off for now as social work intervention is no longer needed. Please consult [name] again if new needs arise.    Final next level of care: Skilled Nursing Facility Barriers to Discharge: Barriers Resolved   Patient Goals and CMS Choice      Discharge Placement                    Name of family member notified: Patient alert and oriented Patient and family notified of of transfer: 05/05/22  Discharge Plan and Services Additional resources added to the After Visit Summary for                                       Social Determinants of Health (SDOH) Interventions SDOH Screenings   Food Insecurity: Food Insecurity Present (05/04/2022)  Housing: High Risk (05/04/2022)  Transportation Needs: Unmet Transportation Needs (05/04/2022)  Utilities: At Risk (05/04/2022)  Alcohol Screen: Low Risk  (02/26/2022)  Depression (PHQ2-9): Low Risk  (05/03/2022)  Recent Concern: Depression (PHQ2-9) - High Risk (02/25/2022)  Financial Resource Strain: High Risk (05/22/2020)  Physical Activity: Inactive (05/22/2020)  Social Connections: Moderately Isolated (05/22/2020)  Stress: Stress Concern Present (05/22/2020)  Tobacco Use: High Risk (05/04/2022)     Readmission Risk Interventions     No data to display

## 2022-05-06 NOTE — Discharge Summary (Signed)
[facility] Discharge Summary  Patient name: John [name] Medical record number: [id number] Date of birth: 1970-08-04 Age: 52 y.o. Gender: male Date of Admission: 05/04/2022  Date of Discharge: 05/05/2022 Admitting Physician: Eppie Gibson, MD  Primary Care Provider: Pcp, No Consultants: none  Indication for Hospitalization: Shortness of breath, cough, and fever.  Brief Hospital Course:  John [name] is a 52 y.o. male presenting with shortness of breath, cough, and fever secondary to asthma exacerbation.  Asthma With EMS patient was noted to be dyspneic and wheezing, given solumedrol and then multiple breathing treatments in the ED. Trigger was likely infectious in nature as patient was febrile prior to onset of symptoms. CXR was found to be negative for pneumonia. There was also concern for PCP infection in the setting of HIV but CD4 was within normal limits. During hospitalization patient was given prednisone as well as ceftriaxone and azithromycin for broad spectrum antibiotics.  HIV Patient was reportedly out of [place] x 1 year but this conflicts with recent documentation indicating medicine was prescribed in the past month. CD4 came back within normal limits. Attempted to obtain sputum sample for gram stain, C&S but patient could not provide sample given reduction of sputum production.  Issues for PCP follow-up: HIV regimen adherence  Discharge Diagnoses/Problem List:  * Moderate asthma with exacerbation HIV (human immunodeficiency virus infection) Cocaine use disorder, severe, dependence Schizoaffective disorder  Disposition: to family members  Discharge Condition: stable  Discharge Exam:  Temp:  [97.3 F (36.3 C)-98.6 F (37 C)] 98 F (36.7 C) (02/21 0539) Pulse Rate:  [71-106] 71 (02/21 0539) Resp:  [18-22] 18 (02/21 0539) BP: (103-143)/(60-94) 106/82 (02/21 0539) SpO2:  [96 %-100 %] 99 % (02/21 0539) Physical Exam: General: in  no acute distress HEENT: normocephalic and atraumatic Respiratory: clear to auscultation bilaterally posteriorly, non-labored breathing, and on RA Extremities: moving all extremities spontaneously Gastrointestinal: non-tender and non-distended Cardiovascular: regular rate  Significant Labs and Imaging:  Recent Labs  Lab 05/05/22 0315  WBC 8.0  HGB 11.7*  HCT 36.3*  PLT 340   Recent Labs  Lab 05/05/22 0315  NA 140  K 3.6  CL 105  CO2 25  GLUCOSE 114*  BUN 22*  CREATININE 0.92  CALCIUM 8.7*  ALKPHOS 78  AST 18  ALT 13  ALBUMIN 3.0*   Discharge Medications:  Allergies as of 05/05/2022       Reactions   Shellfish Allergy Anaphylaxis, Swelling        Medication List     TAKE these medications    Biktarvy 50-200-25 MG Tabs tablet Generic drug: bictegravir-emtricitabine-tenofovir AF Take 1 tablet by mouth daily.   gabapentin 100 MG capsule Commonly known as: NEURONTIN Take 2 capsules (200 mg total) by mouth 3 (three) times daily.   nicotine 14 mg/24hr patch Commonly known as: NICODERM CQ - dosed in mg/24 hours Place 1 patch (14 mg total) onto the skin daily.   pantoprazole 40 MG tablet Commonly known as: PROTONIX Take 1 tablet (40 mg total) by mouth daily.   predniSONE 20 MG tablet Commonly known as: DELTASONE Take 3 tablets (60 mg total) by mouth daily with breakfast for 3 days. What changed:  how much to take additional instructions   QUEtiapine 300 MG 24 hr tablet Commonly known as: SEROQUEL XR Take 1 tablet (300 mg total) by mouth at bedtime.   Symbicort 80-4.5 MCG/ACT inhaler Generic drug: budesonide-formoterol Inhale 2 puffs into the lungs in the morning and at  bedtime.   traZODone 50 MG tablet Commonly known as: DESYREL Take 1 tablet (50 mg total) by mouth at bedtime as needed for sleep (sleep difficulty).   Ventolin HFA 108 (90 Base) MCG/ACT inhaler Generic drug: albuterol Inhale 2 puffs into the lungs every 4 (four) hours as needed  for wheezing or shortness of breath.       Discharge Instructions: Please refer to Patient Instructions section of EMR for full details.  Patient was counseled important signs and symptoms that should prompt return to medical care, changes in medications, dietary instructions, activity restrictions, and follow up appointments.   Follow-Up Appointments:  Follow-up Information     Tommy Medal, Lavell Islam, MD Follow up.   Specialty: Infectious Diseases Contact information: [address] [place] [address] [phone]                 Camelia Phenes, MD 05/06/2022, 9:49 AM PGY-1, [place]

## 2022-05-11 ENCOUNTER — Encounter (HOSPITAL_COMMUNITY): Payer: Self-pay | Admitting: Registered Nurse

## 2022-05-11 ENCOUNTER — Ambulatory Visit (HOSPITAL_COMMUNITY)
Admission: EM | Admit: 2022-05-11 | Discharge: 2022-05-12 | Disposition: A | Discharge status: Home / Self Care | Payer: Medicaid Other

## 2022-05-11 DIAGNOSIS — R441 Visual hallucinations: Secondary | ICD-10-CM | POA: Diagnosis not present

## 2022-05-11 DIAGNOSIS — F1994 Other psychoactive substance use, unspecified with psychoactive substance-induced mood disorder: Secondary | ICD-10-CM | POA: Diagnosis present

## 2022-05-11 DIAGNOSIS — F319 Bipolar disorder, unspecified: Secondary | ICD-10-CM | POA: Insufficient documentation

## 2022-05-11 DIAGNOSIS — F1990 Other psychoactive substance use, unspecified, uncomplicated: Secondary | ICD-10-CM

## 2022-05-11 DIAGNOSIS — Z5900 Homelessness unspecified: Secondary | ICD-10-CM | POA: Insufficient documentation

## 2022-05-11 DIAGNOSIS — R440 Auditory hallucinations: Secondary | ICD-10-CM | POA: Diagnosis not present

## 2022-05-11 DIAGNOSIS — R45851 Suicidal ideations: Secondary | ICD-10-CM | POA: Insufficient documentation

## 2022-05-11 DIAGNOSIS — F1910 Other psychoactive substance abuse, uncomplicated: Secondary | ICD-10-CM | POA: Diagnosis present

## 2022-05-11 DIAGNOSIS — F1210 Cannabis abuse, uncomplicated: Secondary | ICD-10-CM | POA: Insufficient documentation

## 2022-05-11 DIAGNOSIS — F19959 Other psychoactive substance use, unspecified with psychoactive substance-induced psychotic disorder, unspecified: Secondary | ICD-10-CM

## 2022-05-11 DIAGNOSIS — F1911 Other psychoactive substance abuse, in remission: Secondary | ICD-10-CM | POA: Insufficient documentation

## 2022-05-11 DIAGNOSIS — F1410 Cocaine abuse, uncomplicated: Secondary | ICD-10-CM | POA: Insufficient documentation

## 2022-05-11 DIAGNOSIS — Z1152 Encounter for screening for COVID-19: Secondary | ICD-10-CM | POA: Diagnosis not present

## 2022-05-11 LAB — POCT URINE DRUG SCREEN - MANUAL ENTRY (I-SCREEN)
POC Amphetamine UR: NOT DETECTED
POC Buprenorphine (BUP): NOT DETECTED
POC Cocaine UR: POSITIVE — AB
POC Marijuana UR: POSITIVE — AB
POC Methadone UR: NOT DETECTED
POC Methamphetamine UR: NOT DETECTED
POC Morphine: NOT DETECTED
POC Oxazepam (BZO): NOT DETECTED
POC Oxycodone UR: NOT DETECTED
POC Secobarbital (BAR): NOT DETECTED

## 2022-05-11 LAB — RESP PANEL BY RT-PCR (RSV, FLU A&B, COVID)  RVPGX2
Influenza A by PCR: NEGATIVE
Influenza B by PCR: NEGATIVE
Resp Syncytial Virus by PCR: NEGATIVE
SARS Coronavirus 2 by RT PCR: NEGATIVE

## 2022-05-11 LAB — URINALYSIS, ROUTINE W REFLEX MICROSCOPIC
Bilirubin Urine: NEGATIVE
Glucose, UA: NEGATIVE mg/dL
Hgb urine dipstick: NEGATIVE
Ketones, ur: NEGATIVE mg/dL
Leukocytes,Ua: NEGATIVE
Nitrite: NEGATIVE
Protein, ur: NEGATIVE mg/dL
Specific Gravity, Urine: 1.025 (ref 1.005–1.030)
pH: 6 (ref 5.0–8.0)

## 2022-05-11 LAB — POC SARS CORONAVIRUS 2 AG: SARSCOV2ONAVIRUS 2 AG: NEGATIVE

## 2022-05-11 MED ORDER — TRAZODONE HCL 50 MG PO TABS: 50.0000 mg | ORAL_TABLET | Freq: Every evening | ORAL | Status: DC | PRN

## 2022-05-11 MED ORDER — QUETIAPINE FUMARATE ER 300 MG PO TB24
300.0000 mg | ORAL_TABLET | Freq: Every day | ORAL | Status: DC
  Administered 2022-05-11: 300 mg via ORAL
  Filled 2022-05-11: qty 1

## 2022-05-11 MED ORDER — BICTEGRAVIR-EMTRICITAB-TENOFOV 50-200-25 MG PO TABS
1.0000 | ORAL_TABLET | Freq: Every day | ORAL | Status: DC
  Administered 2022-05-11 – 2022-05-12 (×2): 1 via ORAL
  Filled 2022-05-11 (×2): qty 1

## 2022-05-11 MED ORDER — HYDROXYZINE HCL 25 MG PO TABS: 25.0000 mg | ORAL_TABLET | Freq: Three times a day (TID) | ORAL | Status: DC | PRN

## 2022-05-11 MED ORDER — MAGNESIUM HYDROXIDE 400 MG/5ML PO SUSP: 30.0000 mL | Freq: Every day | ORAL | Status: DC | PRN

## 2022-05-11 MED ORDER — GABAPENTIN 100 MG PO CAPS
200.0000 mg | ORAL_CAPSULE | Freq: Three times a day (TID) | ORAL | Status: DC
  Administered 2022-05-11 – 2022-05-12 (×3): 200 mg via ORAL
  Filled 2022-05-11 (×4): qty 2

## 2022-05-11 MED ORDER — MOMETASONE FURO-FORMOTEROL FUM 100-5 MCG/ACT IN AERO
2.0000 | INHALATION_SPRAY | Freq: Two times a day (BID) | RESPIRATORY_TRACT | Status: DC
  Administered 2022-05-11 – 2022-05-12 (×2): 2 via RESPIRATORY_TRACT
  Filled 2022-05-11 (×2): qty 8.8

## 2022-05-11 MED ORDER — PANTOPRAZOLE SODIUM 40 MG PO TBEC
40.0000 mg | DELAYED_RELEASE_TABLET | Freq: Every day | ORAL | Status: DC
  Administered 2022-05-11 – 2022-05-12 (×2): 40 mg via ORAL
  Filled 2022-05-11 (×2): qty 1

## 2022-05-11 MED ORDER — ALBUTEROL SULFATE HFA 108 (90 BASE) MCG/ACT IN AERS: 2.0000 | INHALATION_SPRAY | RESPIRATORY_TRACT | Status: DC | PRN

## 2022-05-11 MED ORDER — ALUM & MAG HYDROXIDE-SIMETH 200-200-20 MG/5ML PO SUSP: 30.0000 mL | ORAL | Status: DC | PRN

## 2022-05-11 MED ORDER — ACETAMINOPHEN 325 MG PO TABS: 650.0000 mg | ORAL_TABLET | Freq: Four times a day (QID) | ORAL | Status: DC | PRN

## 2022-05-11 MED ORDER — NICOTINE 14 MG/24HR TD PT24
14.0000 mg | MEDICATED_PATCH | Freq: Every day | TRANSDERMAL | Status: DC
  Administered 2022-05-11 – 2022-05-12 (×2): 14 mg via TRANSDERMAL
  Filled 2022-05-11 (×2): qty 1

## 2022-05-11 NOTE — ED Notes (Signed)
Rn was unable to get blood . Patient states that he has not drank many fluids. Notified provider.2nd rn was unable to access also

## 2022-05-11 NOTE — Progress Notes (Signed)
   05/11/22 1127  John [name] (Walk-ins at [facility] only)  How Did You Hear About [name]? Legal System  What Is the Reason for Your Visit/Call Today? Pt is a 52 yo male who presented via [employer] voluntarily and unaccompanied. Pt reported a hx of Bipolar d/o and no medications for about 6 months. Pt reported SI with and plan to kill himself "by any means necessary" and named about 3-5 methods he had considered. Pt stated he does not try "because of God." Pt stated he has a hx of multipl suicide attmpets with the last attempt in 2001. Pt reported he is hearing voices telling him to kill people "by any means available." Pt stated that he especially wants to hurt people who stare at him. Pt reported "seeing" purple butterflies, yellow flies and Martians. Pt denied NSSH and paranoia. pt reported polysubstance abuse including use yesterday of alcohol, crack cocaine and cannabis.  How Long Has This Been Causing You Problems? > than 6 months  Have You Recently Had Any Thoughts About Hurting Yourself? Yes  How long ago did you have thoughts about hurting yourself? earlier today  Are You Planning to [place] At This time? Yes  Have you Recently Had Thoughts About Hurting Someone [name]? Yes  How long ago did you have thoughts of harming others? earlier today  Are You Planning To Harm Someone At This Time? No  Are you currently experiencing any auditory, visual or other hallucinations? Yes  Please explain the hallucinations you are currently experiencing: hearing voices telling him to hurt others; seeing animals in various colors  Have You Used Any Alcohol or Drugs in the Past 24 Hours? Yes  How long ago did you use Drugs or Alcohol? alcohol, crack cocaine and cannabis yesterday  What Did You Use and How Much? yesterday, unknown amounts  Do you have any current medical co-morbidities that require immediate attention? No  Clinician description of patient physical appearance/behavior: Pt was  calm, cooperative, alert and appeared oriented. Pt did not appear to be responding to internal stimuli, experiencing delusional thinking or intoxicated. Pt's speech and movement appeared within normal limits and appearance was unremarkable. Pt's mood seemed anxious and depressed, and pt had a flat affect which was congruent. Pt's speech and movement seemed nervous and jittery. Pt's judgment and insight seemed fair.  What Do You Feel Would Help You the Most Today? Treatment for Depression or other mood problem  If access to [facility] was not available, would you have sought care in the Emergency Department? Yes  Determination of Need Urgent (48 hours)  Options For Referral [facility]   Nelva Hauk T. Mare Ferrari, [place], [facility], [facility] Triage Specialist [facility]

## 2022-05-11 NOTE — ED Notes (Signed)
Pt sleeping in recliner bed. RR even and unlabored. No noted distress. Will continue to monitor for safety

## 2022-05-11 NOTE — ED Provider Notes (Signed)
[facility] Continuous Assessment Admission H&P  Date: 05/11/22 Patient Name: John [name] MRN: [id number] Chief Complaint: Worsening depression and suicidal ideation  Diagnoses:  Final diagnoses:  Substance induced mood disorder ([place])  Psychoactive substance-induced psychosis ([place])  Polysubstance use disorder    HPI: John [name] 52 y.o., male patient presented to [facility] via [place] with complaints of worsening depression and suicidal ideation along with auditory/visual hallucinations.    Patient seen face to face by this provider, consulted with Dr. Hampton Abbot; and chart reviewed on 05/11/22.  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  When asked what was the stress is causing him to be suicidal patient states"to too many things, everything, life stressors, homelessness, just everything."  Patient asked if he was having any homicidal ideations.  He did spines"yes anybody and everybody.  Anything necessary to kill him with."  Patient reports that he does not currently have any outpatient psychiatric services.  States it has been over a year since he has had any psychotropic medications.  Patient reports that he is having auditory hallucinations with the voices telling him"to kill everyone, but I am going to hell, and that I do not need to be here."  Patient denies illicit drug use but states that he used cocaine yesterday.  According to chart review patient has chronic history of polysubstance abuse (alcohol, cocaine, marijuana).  During evaluation John [name] is sitting upright in chair with no noted distress.  He is alert/oriented x 4, calm, cooperative, but irritable.  His responses were appropriate to assessment questions.  His mood is irritable and dysphoric with congruent affect.  He spoke in a clear tone at moderate volume, and normal pace, with good eye contact.  Objectively:  there is no evidence of  psychosis/mania or delusional thinking other than patients endorsement or auditory hallucination.  He conversed coherently, but liner information, goal directed thoughts, and no distractibility, or pre-occupation.     Total Time spent with patient: 45 minutes  Musculoskeletal  Strength & Muscle Tone: within normal limits Gait & Station: normal Patient leans: N/A  Psychiatric Specialty Exam  Presentation General Appearance:  Appropriate for Environment; Disheveled  Eye Contact: Good  Speech: Clear and Coherent; Normal Rate  Speech Volume: Normal  Handedness: Right   Mood and Affect  Mood: Irritable  Affect: Congruent   Thought Process  Thought Processes: Coherent; Linear  Descriptions of Associations:Intact  Orientation:Full (Time, Place and Person)  Thought Content:Logical  Diagnosis of Schizophrenia or Schizoaffective disorder in past: Yes  Duration of Psychotic Symptoms: Greater than six months  Hallucinations:Hallucinations: Auditory Description of Auditory Hallucinations: Reports he is hearing voices telling him that he is no good and that he is going to hell.  Ideas of Reference:None  Suicidal Thoughts:Suicidal Thoughts: Yes, Active SI Active Intent and/or Plan: With Intent; Without Plan  Homicidal Thoughts:Homicidal Thoughts: No   Sensorium  Memory: Immediate Good; Recent Good; Remote Good  Judgment: Intact  Insight: Present   Executive Functions  Concentration: Good  Attention Span: Good  Recall: Good  Fund of Knowledge: Good  Language: Good   Psychomotor Activity  Psychomotor Activity: Psychomotor Activity: Normal   Assets  Assets: Communication Skills; Desire for Improvement; Social Support   Sleep  Sleep: Sleep: Good   Nutritional Assessment (For OBS and FBC admissions only) Has the patient had a weight loss or gain of 10 pounds or more in the last 3 months?: No  Has the patient had a decrease in food  intake/or appetite?: No Does the patient have dental problems?: No Does the patient have eating habits or behaviors that may be indicators of an eating disorder including binging or inducing vomiting?: No Has the patient recently lost weight without trying?: 0 Has the patient been eating poorly because of a decreased appetite?: 0 Malnutrition Screening Tool Score: 0    Physical Exam Vitals and nursing note reviewed. Exam conducted with a chaperone present.  Constitutional:      General: He is not in acute distress.    Appearance: Normal appearance. He is not ill-appearing.  HENT:     Head: Normocephalic.  Cardiovascular:     Rate and Rhythm: Normal rate.  Pulmonary:     Effort: Pulmonary effort is normal.  Musculoskeletal:        General: Normal range of motion.     Cervical back: Normal range of motion.  Skin:    General: Skin is warm and dry.  Neurological:     Mental Status: He is alert and oriented to person, place, and time.  Psychiatric:        Attention and Perception: Attention and perception normal.        Mood and Affect: Mood is anxious.        Speech: Speech normal.        Behavior: Behavior normal. Agitated: irritable. Behavior is cooperative.        Thought Content: Thought content includes homicidal and suicidal ideation. Thought content does not include homicidal or suicidal plan.    Review of Systems  Psychiatric/Behavioral:  Positive for depression, hallucinations, substance abuse and suicidal ideas. The patient is nervous/anxious and has insomnia.   All other systems reviewed and are negative.   Blood pressure (!) 141/84, pulse 85, temperature 98.9 F (37.2 C), temperature source Oral, resp. rate 18, SpO2 98 %. There is no height or weight on file to calculate BMI.  Past Psychiatric History: see below Past Medical History:  Diagnosis Date   Acute hypoxemic respiratory failure ([place]) 05/07/2021   Adjustment disorder with depressed mood 10/28/2015    Alcoholism ([place])    Asthma    Bipolar disorder ([place])    with depression/anxiety   Cannabis use disorder, moderate, dependence ([place]) 04/02/2015   Chronic low back pain    Cocaine use disorder ([place]) 04/02/2015   Gout    HIV (human immunodeficiency virus infection) ([place])    "dx'd ~ 2 yr ago" (09/29/2012)   Homelessness    Hypertension    Polysubstance abuse ([place]) 04/11/2019      Is the patient at risk to self? Yes  Has the patient been a risk to self in the past 6 months? Yes .    Has the patient been a risk to self within the distant past? Yes   Is the patient a risk to others? Yes   Has the patient been a risk to others in the past 6 months? No   Has the patient been a risk to others within the distant past? No   Past Medical History: HIV, asthma, gout  Family History: None reported  Social History: Unemployed, homeless  Last Labs:  Admission on 05/11/2022  Component Date Value Ref Range Status   POC Amphetamine UR 05/11/2022 None Detected  NONE DETECTED (Cut Off Level 1000 ng/mL) Final   POC Secobarbital (BAR) 05/11/2022 None Detected  NONE DETECTED (Cut Off Level 300 ng/mL) Final   POC Buprenorphine (BUP) 05/11/2022  None Detected  NONE DETECTED (Cut Off Level 10 ng/mL) Final   POC Oxazepam (BZO) 05/11/2022 None Detected  NONE DETECTED (Cut Off Level 300 ng/mL) Final   POC Cocaine UR 05/11/2022 Positive (A)  NONE DETECTED (Cut Off Level 300 ng/mL) Final   POC Methamphetamine UR 05/11/2022 None Detected  NONE DETECTED (Cut Off Level 1000 ng/mL) Final   POC Morphine 05/11/2022 None Detected  NONE DETECTED (Cut Off Level 300 ng/mL) Final   POC Methadone UR 05/11/2022 None Detected  NONE DETECTED (Cut Off Level 300 ng/mL) Final   POC Oxycodone UR 05/11/2022 None Detected  NONE DETECTED (Cut Off Level 100 ng/mL) Final   POC Marijuana UR 05/11/2022 Positive (A)  NONE DETECTED (Cut Off Level 50 ng/mL) Final   SARSCOV2ONAVIRUS 2 AG 05/11/2022 NEGATIVE  NEGATIVE Final   Comment:  (NOTE) SARS-CoV-2 antigen NOT DETECTED.   Negative results are presumptive.  Negative results do not preclude SARS-CoV-2 infection and should not be used as the sole basis for treatment or other patient management decisions, including infection  control decisions, particularly in the presence of clinical signs and  symptoms consistent with COVID-19, or in those who have been in contact with the virus.  Negative results must be combined with clinical observations, patient history, and epidemiological information. The expected result is Negative.  Fact Sheet for Patients: HandmadeRecipes.com.cy  Fact Sheet for Healthcare Providers: FuneralLife.at  This test is not yet approved or cleared by the [place] FDA and  has been authorized for detection and/or diagnosis of SARS-CoV-2 by FDA under an Emergency Use Authorization (EUA).  This EUA will remain in effect (meaning this test can be used) for the duration of  the COV                          ID-19 declaration under Section 564(b)(1) of the Act, 21 U.S.C. section 360bbb-3(b)(1), unless the authorization is terminated or revoked sooner.    Admission on 05/04/2022, Discharged on 05/05/2022  Component Date Value Ref Range Status   Sodium 05/04/2022 137  135 - 145 mmol/L Final   Potassium 05/04/2022 4.9  3.5 - 5.1 mmol/L Final   HEMOLYSIS AT THIS LEVEL MAY AFFECT RESULT   Chloride 05/04/2022 101  98 - 111 mmol/L Final   CO2 05/04/2022 25  22 - 32 mmol/L Final   Glucose, Bld 05/04/2022 91  70 - 99 mg/dL Final   Glucose reference range applies only to samples taken after fasting for at least 8 hours.   BUN 05/04/2022 9  6 - 20 mg/dL Final   Creatinine, Ser 05/04/2022 0.85  0.61 - 1.24 mg/dL Final   Calcium 05/04/2022 8.6 (L)  8.9 - 10.3 mg/dL Final   GFR, Estimated 05/04/2022 >60  >60 mL/min Final   Comment: (NOTE) Calculated using the CKD-EPI Creatinine Equation (2021)    Anion gap  05/04/2022 11  5 - 15 Final   Performed at [facility], [place] [address] [place], [place] [address]   WBC 05/04/2022 5.8  4.0 - 10.5 K/uL Final   RBC 05/04/2022 4.59  4.22 - 5.81 MIL/uL Final   Hemoglobin 05/04/2022 13.7  13.0 - 17.0 g/dL Final   HCT 05/04/2022 42.9  39.0 - 52.0 % Final   MCV 05/04/2022 93.5  80.0 - 100.0 fL Final   MCH 05/04/2022 29.8  26.0 - 34.0 pg Final   MCHC 05/04/2022 31.9  30.0 - 36.0 g/dL Final   RDW 05/04/2022  13.6  11.5 - 15.5 % Final   Platelets 05/04/2022 348  150 - 400 K/uL Final   nRBC 05/04/2022 0.0  0.0 - 0.2 % Final   Neutrophils Relative % 05/04/2022 34  % Final   Neutro Abs 05/04/2022 2.0  1.7 - 7.7 K/uL Final   Lymphocytes Relative 05/04/2022 51  % Final   Lymphs Abs 05/04/2022 3.0  0.7 - 4.0 K/uL Final   Monocytes Relative 05/04/2022 10  % Final   Monocytes Absolute 05/04/2022 0.6  0.1 - 1.0 K/uL Final   Eosinophils Relative 05/04/2022 4  % Final   Eosinophils Absolute 05/04/2022 0.2  0.0 - 0.5 K/uL Final   Basophils Relative 05/04/2022 1  % Final   Basophils Absolute 05/04/2022 0.0  0.0 - 0.1 K/uL Final   Immature Granulocytes 05/04/2022 0  % Final   Abs Immature Granulocytes 05/04/2022 0.01  0.00 - 0.07 K/uL Final   Performed at [facility], [place] [address], [place], [place] [address]   SARS Coronavirus 2 by RT PCR 05/04/2022 NEGATIVE  NEGATIVE Final   Influenza A by PCR 05/04/2022 NEGATIVE  NEGATIVE Final   Influenza B by PCR 05/04/2022 NEGATIVE  NEGATIVE Final   Comment: (NOTE) The Xpert Xpress SARS-CoV-2/FLU/RSV plus assay is intended as an aid in the diagnosis of influenza from Nasopharyngeal swab specimens and should not be used as a sole basis for treatment. Nasal washings and aspirates are unacceptable for Xpert Xpress SARS-CoV-2/FLU/RSV testing.  Fact Sheet for Patients: EntrepreneurPulse.com.au  Fact Sheet for Healthcare Providers: IncredibleEmployment.be  This test is not yet  approved or cleared by the [place] FDA and has been authorized for detection and/or diagnosis of SARS-CoV-2 by FDA under an Emergency Use Authorization (EUA). This EUA will remain in effect (meaning this test can be used) for the duration of the COVID-19 declaration under Section 564(b)(1) of the Act, 21 U.S.C. section 360bbb-3(b)(1), unless the authorization is terminated or revoked.     Resp Syncytial Virus by PCR 05/04/2022 NEGATIVE  NEGATIVE Final   Comment: (NOTE) Fact Sheet for Patients: EntrepreneurPulse.com.au  Fact Sheet for Healthcare Providers: IncredibleEmployment.be  This test is not yet approved or cleared by the [place] FDA and has been authorized for detection and/or diagnosis of SARS-CoV-2 by FDA under an Emergency Use Authorization (EUA). This EUA will remain in effect (meaning this test can be used) for the duration of the COVID-19 declaration under Section 564(b)(1) of the Act, 21 U.S.C. section 360bbb-3(b)(1), unless the authorization is terminated or revoked.  Performed at [facility], [place] [address], [place], [place] [address]    LDH 05/04/2022 245 (H)  98 - 192 U/L Final   Comment: HEMOLYSIS AT THIS LEVEL MAY AFFECT RESULT Performed at [facility], [place] [address] [place], [place] [address]    Total lymphocyte count 05/04/2022 2,515  1,000 - 4,000 /uL Final   CD4% 05/04/2022 20.87 (L)  33 - 65 % Final   CD4 absolute 05/04/2022 525  400 - 1,790 /uL Final   CD8tox 05/04/2022 50.05 (H)  12 - 40 % Final   CD8 T Cell Abs 05/04/2022 1,259 (H)  190 - 1,000 /uL Final   Ratio 05/04/2022 0.42 (L)  1.0 - 3.0 Final   Performed at [facility], [place] [address], [place], [place] [address]   WBC 05/05/2022 8.0  4.0 - 10.5 K/uL Final   RBC 05/05/2022 3.92 (L)  4.22 - 5.81 MIL/uL Final   Hemoglobin  05/05/2022 11.7 (L)  13.0 - 17.0 g/dL Final   HCT 05/05/2022 36.3 (L)  39.0 - 52.0 % Final    MCV 05/05/2022 92.6  80.0 - 100.0 fL Final   MCH 05/05/2022 29.8  26.0 - 34.0 pg Final   MCHC 05/05/2022 32.2  30.0 - 36.0 g/dL Final   RDW 05/05/2022 13.7  11.5 - 15.5 % Final   Platelets 05/05/2022 340  150 - 400 K/uL Final   nRBC 05/05/2022 0.0  0.0 - 0.2 % Final   Performed at [facility], [place] [address] [place], [place] [address]   Sodium 05/05/2022 140  135 - 145 mmol/L Final   Potassium 05/05/2022 3.6  3.5 - 5.1 mmol/L Final   Chloride 05/05/2022 105  98 - 111 mmol/L Final   CO2 05/05/2022 25  22 - 32 mmol/L Final   Glucose, Bld 05/05/2022 114 (H)  70 - 99 mg/dL Final   Glucose reference range applies only to samples taken after fasting for at least 8 hours.   BUN 05/05/2022 22 (H)  6 - 20 mg/dL Final   Creatinine, Ser 05/05/2022 0.92  0.61 - 1.24 mg/dL Final   Calcium 05/05/2022 8.7 (L)  8.9 - 10.3 mg/dL Final   Total Protein 05/05/2022 5.8 (L)  6.5 - 8.1 g/dL Final   Albumin 05/05/2022 3.0 (L)  3.5 - 5.0 g/dL Final   AST 05/05/2022 18  15 - 41 U/L Final   ALT 05/05/2022 13  0 - 44 U/L Final   Alkaline Phosphatase 05/05/2022 78  38 - 126 U/L Final   Total Bilirubin 05/05/2022 0.6  0.3 - 1.2 mg/dL Final   GFR, Estimated 05/05/2022 >60  >60 mL/min Final   Comment: (NOTE) Calculated using the CKD-EPI Creatinine Equation (2021)    Anion gap 05/05/2022 10  5 - 15 Final   Performed at [facility], [place] [address] [place], [place] [address]  Lab on 04/20/2022  Component Date Value Ref Range Status   HIV 1 RNA Quant 04/20/2022 1,480 (H)  copies/mL Final   HIV-1 RNA Quant, Log 04/20/2022 3.17 (H)  Log copies/mL Final   Comment: REFERENCE RANGE:           NOT DETECTED  copies/mL           NOT DETECTED  Log copies/mL . This test was performed using Real-Time Polymerase Chain Reaction. . Reportable range is 20 to 10,000,000 copies/mL (1.30-7.00 Log copies/mL).    HIV-1 Genotype 04/20/2022 DETECTED (A)   Final   Comment: HIV Subtype:  B ___________________________________________________________ Antiretroviral drugs      Resistance  Mutations Detected                           Predicted ___________________________________________________________                                 !   !       NRTIs                     !   ! ZDV (zidovudine or Retrovir)    ! NO!     ABC (abacavir or Ziagen)        ! NO!     ddI (didanosine or Videx)       ! NO!     3TC (lamivudine or Epivir)      !  NO!     FTC (emtricitabine or Emtriva)  ! NO!     d4T (stavudine or Zerit)        ! NO!     TDF (tenofovir or Viread)       ! NO!     ________________________________!___!______________________                                 !   !       NNRTIs                    !   ! ETR (etravirine or Intelence)   ! NO!     EFV (efavirenz or Sustiva)      ! NO!     NVP (nevirapine or Viramune)    ! NO!     RPV (rilpivirine or Edurant)    ! NO!     DOR (doravirine or Pifeltro)    ! NO!     ________________________________!_                          __!______________________                                 !   !       PIs                       !   ! FPV (fos-amprenavir or Lexiva)  ! NO!     IDV (indinavir or Crixivan)     ! NO!     NFV (nelfinavir or Viracept)    ! NO!     SQV (saquinavir or Invirase)    ! NO!     LPV (lopinavir or Kaletra)      ! NO!     ATV (atazanavir or Reyataz)     ! NO!     TPV (tipranavir or Aptivus)     ! NO!     DRV (darunavir or Prezista)     ! NO!                                     !   ! ________________________________!___!______________________ PRB = PROBABLE OR EMERGING RESISTANCE OTHER MUTATIONS DETECTED: RT GENE MUTATIONS: R211K PR GENE MUTATIONS: I13V,M36I,L63P/S,I64V,V77E/V ___________________________________________________________ [facility] Sep 2022 Interpretation Algorithm The method used in this test is RT-PCR and sequencing  of the HIV-1 polymerase gene. . The phrases "resistance predicted"  and "probable  or emerging resistance" refer to the application of                            the interpretive rules. The FDA has not reviewed all of the interpretive rules used by the laboratory  to predict drug resistance. FDA may not currently  recognize some of the HIV gene mutations reported as  predictive of drug resistance, but the laboratory  considers these mutations to be associated with  resistance to anti-viral drugs based on current  clinical or scientific studies. The test has been  validated pursuant to CLIA regulations and is not  considered investigational or for research use only. Treatment decisions should be made in  consideration of  all relevant clinical and laboratory findings and the prescribing information for the drugs. . This test was developed and its analytical performance characteristics have been determined by [facility]. It has not been cleared or approved by FDA. This assay has been validated pursuant to the CLIA regulations and is used for clinical purposes. .    Raltegravir Resistance 04/20/2022 NOT PREDICTED   Final   Elvitegravir Resistance 04/20/2022 NOT PREDICTED   Final   Dolutegravir Resistance 04/20/2022 NOT PREDICTED   Final   BICTEGRAVIR RESISTANCE 04/20/2022 NOT PREDICTED   Final   Cabotegravir resistance 04/20/2022 NOT PREDICTED   Final   Comment: Mutations Detected: NONE. . The [facility] March 2023 Interpretation Algorithm . The method used in this test is RT-PCR and sequencing of the HIV-1 integrase gene. The phrases "resistance predicted" and "probable or emerging resistance" refer to the application of the interpretive rules. The FDA has not reviewed all of the interpretive rules used by the laboratory to predict drug resistance. FDA may not currently recognize some of the HIV gene mutations reported as predictive of drug resistance, but the laboratory considers these mutations to be associated with resistance to  anti-viral drugs based on current clinical or scientific studies. . This test was developed and its analytical performance characteristics have been determined by [facility]. It has not been cleared or approved by FDA. This assay has been validated pursuant to the CLIA regulations and is used for clinical purposes. . For more information on this test, go to: http://education.ques                          tdiagnostics.com/faq/FAQ135 (This link is being provided for informational/educational purposes only.) .   Admission on 03/24/2022, Discharged on 03/24/2022  Component Date Value Ref Range Status   Sodium 03/24/2022 138  135 - 145 mmol/L Final   Potassium 03/24/2022 4.1  3.5 - 5.1 mmol/L Final   Chloride 03/24/2022 104  98 - 111 mmol/L Final   CO2 03/24/2022 23  22 - 32 mmol/L Final   Glucose, Bld 03/24/2022 94  70 - 99 mg/dL Final   Glucose reference range applies only to samples taken after fasting for at least 8 hours.   BUN 03/24/2022 9  6 - 20 mg/dL Final   Creatinine, Ser 03/24/2022 0.93  0.61 - 1.24 mg/dL Final   Calcium 03/24/2022 8.7 (L)  8.9 - 10.3 mg/dL Final   GFR, Estimated 03/24/2022 >60  >60 mL/min Final   Comment: (NOTE) Calculated using the CKD-EPI Creatinine Equation (2021)    Anion gap 03/24/2022 11  5 - 15 Final   Performed at [facility], [place] [address], [place], [place] [address]   WBC 03/24/2022 4.4  4.0 - 10.5 K/uL Final   RBC 03/24/2022 4.81  4.22 - 5.81 MIL/uL Final   Hemoglobin 03/24/2022 14.0  13.0 - 17.0 g/dL Final   HCT 03/24/2022 44.6  39.0 - 52.0 % Final   MCV 03/24/2022 92.7  80.0 - 100.0 fL Final   MCH 03/24/2022 29.1  26.0 - 34.0 pg Final   MCHC 03/24/2022 31.4  30.0 - 36.0 g/dL Final   RDW 03/24/2022 13.8  11.5 - 15.5 % Final   Platelets 03/24/2022 369  150 - 400 K/uL Final   nRBC 03/24/2022 0.0  0.0 - 0.2 % Final   Neutrophils Relative % 03/24/2022 39  % Final   Neutro Abs 03/24/2022 1.7  1.7 -  7.7 K/uL Final    Lymphocytes Relative 03/24/2022 52  % Final   Lymphs Abs 03/24/2022 2.3  0.7 - 4.0 K/uL Final   Monocytes Relative 03/24/2022 6  % Final   Monocytes Absolute 03/24/2022 0.3  0.1 - 1.0 K/uL Final   Eosinophils Relative 03/24/2022 2  % Final   Eosinophils Absolute 03/24/2022 0.1  0.0 - 0.5 K/uL Final   Basophils Relative 03/24/2022 1  % Final   Basophils Absolute 03/24/2022 0.0  0.0 - 0.1 K/uL Final   Immature Granulocytes 03/24/2022 0  % Final   Abs Immature Granulocytes 03/24/2022 0.01  0.00 - 0.07 K/uL Final   Performed at [place] [name], [place], [place] [address]   B Natriuretic Peptide 03/24/2022 68.0  0.0 - 100.0 pg/mL Final   Performed at [facility] [address], [place], [place] [address]   SARS Coronavirus 2 by RT PCR 03/24/2022 INVALID, UNABLE TO DETERMINE THE PRESENCE OF TARGET DUE TO SPECIMEN INTEGRITY. RECOLLECTION REQUESTED. (A)  NEGATIVE Corrected   CORRECTED ON 01/10 AT 1521: PREVIOUSLY REPORTED AS INVALID, UNABLE TO DETERMINE THE PRESENCE OF TARGET DUE TO SPECIMEN INTEGRITY. RECOLLECTION REQUESTED. RN Herbert Seta ZC:[id number] JRS   Influenza A by PCR 03/24/2022 INVALID, UNABLE TO DETERMINE THE PRESENCE OF TARGET DUE TO SPECIMEN INTEGRITY. RECOLLECTION REQUESTED. (A)  NEGATIVE Corrected   Influenza B by PCR 03/24/2022 INVALID, UNABLE TO DETERMINE THE PRESENCE OF TARGET DUE TO SPECIMEN INTEGRITY. RECOLLECTION REQUESTED. (A)  NEGATIVE Corrected   Resp Syncytial Virus by PCR 03/24/2022 INVALID, UNABLE TO DETERMINE THE PRESENCE OF TARGET DUE TO SPECIMEN INTEGRITY. RECOLLECTION REQUESTED. (A)  NEGATIVE Corrected   Performed at [facility], [place] [address] [place], [place] [address]   SARS Coronavirus 2 by RT PCR 03/24/2022 NEGATIVE  NEGATIVE Final   Comment: (NOTE) SARS-CoV-2 target nucleic acids are NOT DETECTED.  The SARS-CoV-2 RNA is generally detectable in upper respiratory specimens during the acute phase of infection. The  lowest concentration of SARS-CoV-2 viral copies this assay can detect is 138 copies/mL. A negative result does not preclude SARS-Cov-2 infection and should not be used as the sole basis for treatment or other patient management decisions. A negative result may occur with  improper specimen collection/handling, submission of specimen other than nasopharyngeal swab, presence of viral mutation(s) within the areas targeted by this assay, and inadequate number of viral copies(<138 copies/mL). A negative result must be combined with clinical observations, patient history, and epidemiological information. The expected result is Negative.  Fact Sheet for Patients:  EntrepreneurPulse.com.au  Fact Sheet for Healthcare Providers:  IncredibleEmployment.be  This test is no                          t yet approved or cleared by the [place] FDA and  has been authorized for detection and/or diagnosis of SARS-CoV-2 by FDA under an Emergency Use Authorization (EUA). This EUA will remain  in effect (meaning this test can be used) for the duration of the COVID-19 declaration under Section 564(b)(1) of the Act, 21 U.S.C.section 360bbb-3(b)(1), unless the authorization is terminated  or revoked sooner.       Influenza A by PCR 03/24/2022 NEGATIVE  NEGATIVE Final   Influenza B by PCR 03/24/2022 NEGATIVE  NEGATIVE Final   Comment: (NOTE) The Xpert Xpress SARS-CoV-2/FLU/RSV plus assay is intended as an aid in the diagnosis of influenza from Nasopharyngeal swab specimens and should not  be used as a sole basis for treatment. Nasal washings and aspirates are unacceptable for Xpert Xpress SARS-CoV-2/FLU/RSV testing.  Fact Sheet for Patients: EntrepreneurPulse.com.au  Fact Sheet for Healthcare Providers: IncredibleEmployment.be  This test is not yet approved or cleared by the [place] FDA and has been authorized for  detection and/or diagnosis of SARS-CoV-2 by FDA under an Emergency Use Authorization (EUA). This EUA will remain in effect (meaning this test can be used) for the duration of the COVID-19 declaration under Section 564(b)(1) of the Act, 21 U.S.C. section 360bbb-3(b)(1), unless the authorization is terminated or revoked.     Resp Syncytial Virus by PCR 03/24/2022 NEGATIVE  NEGATIVE Final   Comment: (NOTE) Fact Sheet for Patients: EntrepreneurPulse.com.au  Fact Sheet for Healthcare Providers: IncredibleEmployment.be  This test is not yet approved or cleared by the [place] FDA and has been authorized for detection and/or diagnosis of SARS-CoV-2 by FDA under an Emergency Use Authorization (EUA). This EUA will remain in effect (meaning this test can be used) for the duration of the COVID-19 declaration under Section 564(b)(1) of the Act, 21 U.S.C. section 360bbb-3(b)(1), unless the authorization is terminated or revoked.  Performed at [facility], [place] [address] [place], [place] [address]   Admission on 02/26/2022, Discharged on 03/06/2022  Component Date Value Ref Range Status   Campylobacter species 02/28/2022 NOT DETECTED  NOT DETECTED Final   Plesimonas shigelloides 02/28/2022 NOT DETECTED  NOT DETECTED Final   Salmonella species 02/28/2022 NOT DETECTED  NOT DETECTED Final   Yersinia enterocolitica 02/28/2022 NOT DETECTED  NOT DETECTED Final   Vibrio species 02/28/2022 NOT DETECTED  NOT DETECTED Final   Vibrio cholerae 02/28/2022 NOT DETECTED  NOT DETECTED Final   Enteroaggregative E coli (EAEC) 02/28/2022 NOT DETECTED  NOT DETECTED Final   Enteropathogenic E coli (EPEC) 02/28/2022 NOT DETECTED  NOT DETECTED Final   Enterotoxigenic E coli (ETEC) 02/28/2022 NOT DETECTED  NOT DETECTED Final   Shiga like toxin producing E coli * 02/28/2022 NOT DETECTED  NOT DETECTED Final   Shigella/Enteroinvasive E coli (EI* 02/28/2022 NOT DETECTED   NOT DETECTED Final   Cryptosporidium 02/28/2022 NOT DETECTED  NOT DETECTED Final   Cyclospora cayetanensis 02/28/2022 NOT DETECTED  NOT DETECTED Final   Entamoeba histolytica 02/28/2022 NOT DETECTED  NOT DETECTED Final   Giardia lamblia 02/28/2022 NOT DETECTED  NOT DETECTED Final   Adenovirus F40/41 02/28/2022 DETECTED (A)  NOT DETECTED Final   Astrovirus 02/28/2022 NOT DETECTED  NOT DETECTED Final   Norovirus GI/GII 02/28/2022 DETECTED (A)  NOT DETECTED Final   Comment: RESULT CALLED TO, READ BACK BY AND VERIFIED WITH: OLIVETTE WESSEH 03/01/22 1153 MW    Rotavirus A 02/28/2022 NOT DETECTED  NOT DETECTED Final   Sapovirus (I, II, IV, and V) 02/28/2022 NOT DETECTED  NOT DETECTED Final   Performed at [facility], [place] [place], [place] [address]   C Diff antigen 02/28/2022 NEGATIVE  NEGATIVE Final   C Diff toxin 02/28/2022 NEGATIVE  NEGATIVE Final   C Diff interpretation 02/28/2022 No C. difficile detected.   Final   Performed at [facility], [place] [address], [place], [place] [address]   Sodium 03/01/2022 138  135 - 145 mmol/L Final   Potassium 03/01/2022 4.0  3.5 - 5.1 mmol/L Final   Chloride 03/01/2022 108  98 - 111 mmol/L Final   CO2 03/01/2022 23  22 - 32 mmol/L Final   Glucose, Bld 03/01/2022 115 (H)  70 - 99 mg/dL  Final   Glucose reference range applies only to samples taken after fasting for at least 8 hours.   BUN 03/01/2022 17  6 - 20 mg/dL Final   Creatinine, Ser 03/01/2022 0.79  0.61 - 1.24 mg/dL Final   Calcium 03/01/2022 9.0  8.9 - 10.3 mg/dL Final   Total Protein 03/01/2022 7.6  6.5 - 8.1 g/dL Final   Albumin 03/01/2022 3.6  3.5 - 5.0 g/dL Final   AST 03/01/2022 16  15 - 41 U/L Final   ALT 03/01/2022 15  0 - 44 U/L Final   Alkaline Phosphatase 03/01/2022 83  38 - 126 U/L Final   Total Bilirubin 03/01/2022 0.4  0.3 - 1.2 mg/dL Final   GFR, Estimated 03/01/2022 >60  >60 mL/min Final   Comment: (NOTE) Calculated using the CKD-EPI  Creatinine Equation (2021)    Anion gap 03/01/2022 7  5 - 15 Final   Performed at [facility], [place] [address], [place], [place] [address]   Lipase 03/01/2022 26  11 - 51 U/L Final   Performed at [facility], [place] [name] [place], [place] [address]   WBC 03/01/2022 5.9  4.0 - 10.5 K/uL Final   RBC 03/01/2022 4.59  4.22 - 5.81 MIL/uL Final   Hemoglobin 03/01/2022 13.4  13.0 - 17.0 g/dL Final   HCT 03/01/2022 42.3  39.0 - 52.0 % Final   MCV 03/01/2022 92.2  80.0 - 100.0 fL Final   MCH 03/01/2022 29.2  26.0 - 34.0 pg Final   MCHC 03/01/2022 31.7  30.0 - 36.0 g/dL Final   RDW 03/01/2022 13.3  11.5 - 15.5 % Final   Platelets 03/01/2022 401 (H)  150 - 400 K/uL Final   nRBC 03/01/2022 0.0  0.0 - 0.2 % Final   Performed at [facility], [place] [address] [place], [place] [address]   Total lymphocyte count 03/01/2022 1,492  1,000 - 4,000 /uL Final   CD4% 03/01/2022 25.80 (L)  33 - 65 % Final   CD4 absolute 03/01/2022 385 (L)  400 - 1,790 /uL Final   CD8tox 03/01/2022 48.32 (H)  12 - 40 % Final   CD8 T Cell Abs 03/01/2022 721  190 - 1,000 /uL Final   Ratio 03/01/2022 0.53 (L)  1.0 - 3.0 Final   Performed at [facility], [place] [address] [place], [place] [address]   HIV 1 RNA Quant 03/04/2022 <20  copies/mL Corrected   Comment: (NOTE) HIV-1 RNA detected The reportable range for this assay is 20 to 10,000,000 copies HIV-1 RNA/mL. Performed At: [facility] [address] [place], [place] [id number] Rush Farmer MD [id number]    LOG10 HIV-1 RNA 03/04/2022 UNABLE TO CALCULATE  log10copy/mL Final   Comment: (NOTE) Unable to calculate result since non-numeric result obtained for component test.   Admission on 02/25/2022, Discharged on 02/26/2022  Component Date Value Ref Range Status   SARS Coronavirus 2 by RT PCR 02/25/2022 NEGATIVE  NEGATIVE Final   Comment: (NOTE) SARS-CoV-2 target nucleic acids  are NOT DETECTED.  The SARS-CoV-2 RNA is generally detectable in upper respiratory specimens during the acute phase of infection. The lowest concentration of SARS-CoV-2 viral copies this assay can detect is 138 copies/mL. A negative result does not preclude SARS-Cov-2 infection and should not be used as the sole basis for treatment or other patient management decisions. A negative result may occur with  improper specimen collection/handling, submission of specimen other than nasopharyngeal swab, presence of  viral mutation(s) within the areas targeted by this assay, and inadequate number of viral copies(<138 copies/mL). A negative result must be combined with clinical observations, patient history, and epidemiological information. The expected result is Negative.  Fact Sheet for Patients:  EntrepreneurPulse.com.au  Fact Sheet for Healthcare Providers:  IncredibleEmployment.be  This test is no                          t yet approved or cleared by the [place] FDA and  has been authorized for detection and/or diagnosis of SARS-CoV-2 by FDA under an Emergency Use Authorization (EUA). This EUA will remain  in effect (meaning this test can be used) for the duration of the COVID-19 declaration under Section 564(b)(1) of the Act, 21 U.S.C.section 360bbb-3(b)(1), unless the authorization is terminated  or revoked sooner.       Influenza A by PCR 02/25/2022 NEGATIVE  NEGATIVE Final   Influenza B by PCR 02/25/2022 NEGATIVE  NEGATIVE Final   Comment: (NOTE) The Xpert Xpress SARS-CoV-2/FLU/RSV plus assay is intended as an aid in the diagnosis of influenza from Nasopharyngeal swab specimens and should not be used as a sole basis for treatment. Nasal washings and aspirates are unacceptable for Xpert Xpress SARS-CoV-2/FLU/RSV testing.  Fact Sheet for Patients: EntrepreneurPulse.com.au  Fact Sheet for Healthcare  Providers: IncredibleEmployment.be  This test is not yet approved or cleared by the [place] FDA and has been authorized for detection and/or diagnosis of SARS-CoV-2 by FDA under an Emergency Use Authorization (EUA). This EUA will remain in effect (meaning this test can be used) for the duration of the COVID-19 declaration under Section 564(b)(1) of the Act, 21 U.S.C. section 360bbb-3(b)(1), unless the authorization is terminated or revoked.     Resp Syncytial Virus by PCR 02/25/2022 NEGATIVE  NEGATIVE Final   Comment: (NOTE) Fact Sheet for Patients: EntrepreneurPulse.com.au  Fact Sheet for Healthcare Providers: IncredibleEmployment.be  This test is not yet approved or cleared by the [place] FDA and has been authorized for detection and/or diagnosis of SARS-CoV-2 by FDA under an Emergency Use Authorization (EUA). This EUA will remain in effect (meaning this test can be used) for the duration of the COVID-19 declaration under Section 564(b)(1) of the Act, 21 U.S.C. section 360bbb-3(b)(1), unless the authorization is terminated or revoked.  Performed at [facility], [place] [address], [place], [place] [address]    WBC 02/25/2022 5.7  4.0 - 10.5 K/uL Final   RBC 02/25/2022 4.31  4.22 - 5.81 MIL/uL Final   Hemoglobin 02/25/2022 13.0  13.0 - 17.0 g/dL Final   HCT 02/25/2022 38.9 (L)  39.0 - 52.0 % Final   MCV 02/25/2022 90.3  80.0 - 100.0 fL Final   MCH 02/25/2022 30.2  26.0 - 34.0 pg Final   MCHC 02/25/2022 33.4  30.0 - 36.0 g/dL Final   RDW 02/25/2022 12.9  11.5 - 15.5 % Final   Platelets 02/25/2022 384  150 - 400 K/uL Final   nRBC 02/25/2022 0.0  0.0 - 0.2 % Final   Neutrophils Relative % 02/25/2022 49  % Final   Neutro Abs 02/25/2022 2.8  1.7 - 7.7 K/uL Final   Lymphocytes Relative 02/25/2022 35  % Final   Lymphs Abs 02/25/2022 2.0  0.7 - 4.0 K/uL Final   Monocytes Relative 02/25/2022 12  % Final    Monocytes Absolute 02/25/2022 0.7  0.1 - 1.0 K/uL Final   Eosinophils Relative 02/25/2022 3  % Final  Eosinophils Absolute 02/25/2022 0.2  0.0 - 0.5 K/uL Final   Basophils Relative 02/25/2022 1  % Final   Basophils Absolute 02/25/2022 0.0  0.0 - 0.1 K/uL Final   Immature Granulocytes 02/25/2022 0  % Final   Abs Immature Granulocytes 02/25/2022 0.01  0.00 - 0.07 K/uL Final   Performed at [facility], [place] [address] [place], [place] [address]   Sodium 02/25/2022 139  135 - 145 mmol/L Final   Potassium 02/25/2022 4.3  3.5 - 5.1 mmol/L Final   Chloride 02/25/2022 104  98 - 111 mmol/L Final   CO2 02/25/2022 26  22 - 32 mmol/L Final   Glucose, Bld 02/25/2022 70  70 - 99 mg/dL Final   Glucose reference range applies only to samples taken after fasting for at least 8 hours.   BUN 02/25/2022 11  6 - 20 mg/dL Final   Creatinine, Ser 02/25/2022 0.76  0.61 - 1.24 mg/dL Final   Calcium 02/25/2022 8.8 (L)  8.9 - 10.3 mg/dL Final   Total Protein 02/25/2022 6.8  6.5 - 8.1 g/dL Final   Albumin 02/25/2022 3.3 (L)  3.5 - 5.0 g/dL Final   AST 02/25/2022 17  15 - 41 U/L Final   ALT 02/25/2022 12  0 - 44 U/L Final   Alkaline Phosphatase 02/25/2022 75  38 - 126 U/L Final   Total Bilirubin 02/25/2022 0.2 (L)  0.3 - 1.2 mg/dL Final   GFR, Estimated 02/25/2022 >60  >60 mL/min Final   Comment: (NOTE) Calculated using the CKD-EPI Creatinine Equation (2021)    Anion gap 02/25/2022 9  5 - 15 Final   Performed at [place] [address], [place], [place] [address]   Hgb A1c MFr Bld 02/25/2022 5.7 (H)  4.8 - 5.6 % Final   Comment: (NOTE)         Prediabetes: 5.7 - 6.4         Diabetes: >6.4         Glycemic control for adults with diabetes: <7.0    Mean Plasma Glucose 02/25/2022 117  mg/dL Final   Comment: (NOTE) Performed At: [facility] [place], [place] [id number] Rush Farmer MD [id number]    Alcohol, Ethyl (B) 02/25/2022 <10  <10 mg/dL Final   Comment:  (NOTE) Lowest detectable limit for serum alcohol is 10 mg/dL.  For medical purposes only. Performed at [facility], [place] [address], [place], [place] [address]    Cholesterol 02/25/2022 152  0 - 200 mg/dL Final   Triglycerides 02/25/2022 76  <150 mg/dL Final   HDL 02/25/2022 54  >40 mg/dL Final   Total CHOL/HDL Ratio 02/25/2022 2.8  RATIO Final   VLDL 02/25/2022 15  0 - 40 mg/dL Final   LDL Cholesterol 02/25/2022 83  0 - 99 mg/dL Final   Comment:        Total Cholesterol/HDL:CHD Risk Coronary Heart Disease Risk Table                     Men   Women  1/2 Average Risk   3.4   3.3  Average Risk       5.0   4.4  2 X Average Risk   9.6   7.1  3 X Average Risk  23.4   11.0        Use the calculated Patient Ratio above and the CHD Risk Table to determine the patient's CHD Risk.  ATP III CLASSIFICATION (LDL):  <100     mg/dL   Optimal  100-129  mg/dL   Near or Above                    Optimal  130-159  mg/dL   Borderline  160-189  mg/dL   High  >190     mg/dL   Very High Performed at [place] [address], [place], [place] [address]    TSH 02/25/2022 1.218  0.350 - 4.500 uIU/mL Final   Comment: Performed by a 3rd Generation assay with a functional sensitivity of <=0.01 uIU/mL. Performed at [facility], [place] [address], [place], [place] [address]    Color, Urine 02/25/2022 YELLOW  YELLOW Final   APPearance 02/25/2022 CLEAR  CLEAR Final   Specific Gravity, Urine 02/25/2022 1.019  1.005 - 1.030 Final   pH 02/25/2022 5.0  5.0 - 8.0 Final   Glucose, UA 02/25/2022 NEGATIVE  NEGATIVE mg/dL Final   Hgb urine dipstick 02/25/2022 NEGATIVE  NEGATIVE Final   Bilirubin Urine 02/25/2022 NEGATIVE  NEGATIVE Final   Ketones, ur 02/25/2022 NEGATIVE  NEGATIVE mg/dL Final   Protein, ur 02/25/2022 NEGATIVE  NEGATIVE mg/dL Final   Nitrite 02/25/2022 NEGATIVE  NEGATIVE Final   Leukocytes,Ua 02/25/2022 NEGATIVE  NEGATIVE Final   Performed at [facility],  [place] [address], [place], [place] [address]   SARSCOV2ONAVIRUS 2 AG 02/25/2022 NEGATIVE  NEGATIVE Final   Comment: (NOTE) SARS-CoV-2 antigen NOT DETECTED.   Negative results are presumptive.  Negative results do not preclude SARS-CoV-2 infection and should not be used as the sole basis for treatment or other patient management decisions, including infection  control decisions, particularly in the presence of clinical signs and  symptoms consistent with COVID-19, or in those who have been in contact with the virus.  Negative results must be combined with clinical observations, patient history, and epidemiological information. The expected result is Negative.  Fact Sheet for Patients: HandmadeRecipes.com.cy  Fact Sheet for Healthcare Providers: FuneralLife.at  This test is not yet approved or cleared by the [place] FDA and  has been authorized for detection and/or diagnosis of SARS-CoV-2 by FDA under an Emergency Use Authorization (EUA).  This EUA will remain in effect (meaning this test can be used) for the duration of  the COV                          ID-19 declaration under Section 564(b)(1) of the Act, 21 U.S.C. section 360bbb-3(b)(1), unless the authorization is terminated or revoked sooner.     SARSCOV2ONAVIRUS 2 AG 02/25/2022 NEGATIVE  NEGATIVE Final   Comment: (NOTE) SARS-CoV-2 antigen NOT DETECTED.   Negative results are presumptive.  Negative results do not preclude SARS-CoV-2 infection and should not be used as the sole basis for treatment or other patient management decisions, including infection  control decisions, particularly in the presence of clinical signs and  symptoms consistent with COVID-19, or in those who have been in contact with the virus.  Negative results must be combined with clinical observations, patient history, and epidemiological information. The expected result is Negative.  Fact Sheet for  Patients: HandmadeRecipes.com.cy  Fact Sheet for Healthcare Providers: FuneralLife.at  This test is not yet approved or cleared by the [place] FDA and  has been authorized for detection and/or diagnosis of SARS-CoV-2 by FDA under an Emergency Use Authorization (EUA).  This EUA will remain  in effect (meaning this test can be used) for the duration of  the COV                          ID-19 declaration under Section 564(b)(1) of the Act, 21 U.S.C. section 360bbb-3(b)(1), unless the authorization is terminated or revoked sooner.     SARSCOV2ONAVIRUS 2 AG 02/25/2022 NEGATIVE  NEGATIVE Final   Comment: (NOTE) SARS-CoV-2 antigen NOT DETECTED.   Negative results are presumptive.  Negative results do not preclude SARS-CoV-2 infection and should not be used as the sole basis for treatment or other patient management decisions, including infection  control decisions, particularly in the presence of clinical signs and  symptoms consistent with COVID-19, or in those who have been in contact with the virus.  Negative results must be combined with clinical observations, patient history, and epidemiological information. The expected result is Negative.  Fact Sheet for Patients: HandmadeRecipes.com.cy  Fact Sheet for Healthcare Providers: FuneralLife.at  This test is not yet approved or cleared by the [place] FDA and  has been authorized for detection and/or diagnosis of SARS-CoV-2 by FDA under an Emergency Use Authorization (EUA).  This EUA will remain in effect (meaning this test can be used) for the duration of  the COV                          ID-19 declaration under Section 564(b)(1) of the Act, 21 U.S.C. section 360bbb-3(b)(1), unless the authorization is terminated or revoked sooner.     SARSCOV2ONAVIRUS 2 AG 02/25/2022 NEGATIVE  NEGATIVE Final   Comment: (NOTE) SARS-CoV-2  antigen NOT DETECTED.   Negative results are presumptive.  Negative results do not preclude SARS-CoV-2 infection and should not be used as the sole basis for treatment or other patient management decisions, including infection  control decisions, particularly in the presence of clinical signs and  symptoms consistent with COVID-19, or in those who have been in contact with the virus.  Negative results must be combined with clinical observations, patient history, and epidemiological information. The expected result is Negative.  Fact Sheet for Patients: HandmadeRecipes.com.cy  Fact Sheet for Healthcare Providers: FuneralLife.at  This test is not yet approved or cleared by the [place] FDA and  has been authorized for detection and/or diagnosis of SARS-CoV-2 by FDA under an Emergency Use Authorization (EUA).  This EUA will remain in effect (meaning this test can be used) for the duration of  the COV                          ID-19 declaration under Section 564(b)(1) of the Act, 21 U.S.C. section 360bbb-3(b)(1), unless the authorization is terminated or revoked sooner.     SARSCOV2ONAVIRUS 2 AG 02/25/2022 NEGATIVE  NEGATIVE Final   Comment: (NOTE) SARS-CoV-2 antigen NOT DETECTED.   Negative results are presumptive.  Negative results do not preclude SARS-CoV-2 infection and should not be used as the sole basis for treatment or other patient management decisions, including infection  control decisions, particularly in the presence of clinical signs and  symptoms consistent with COVID-19, or in those who have been in contact with the virus.  Negative results must be combined with clinical observations, patient history, and epidemiological information. The expected result is Negative.  Fact Sheet for Patients: HandmadeRecipes.com.cy  Fact Sheet for Healthcare  Providers: FuneralLife.at  This test is not yet approved or cleared  by the [place] and  has been authorized for detection and/or diagnosis of SARS-CoV-2 by FDA under an Emergency Use Authorization (EUA).  This EUA will remain in effect (meaning this test can be used) for the duration of  the COV                          ID-19 declaration under Section 564(b)(1) of the Act, 21 U.S.C. section 360bbb-3(b)(1), unless the authorization is terminated or revoked sooner.     Opiates 02/25/2022 NONE DETECTED  NONE DETECTED Final   Cocaine 02/25/2022 POSITIVE (A)  NONE DETECTED Final   Benzodiazepines 02/25/2022 NONE DETECTED  NONE DETECTED Final   Amphetamines 02/25/2022 NONE DETECTED  NONE DETECTED Final   Tetrahydrocannabinol 02/25/2022 NONE DETECTED  NONE DETECTED Final   Barbiturates 02/25/2022 NONE DETECTED  NONE DETECTED Final   Comment: (NOTE) DRUG SCREEN FOR MEDICAL PURPOSES ONLY.  IF CONFIRMATION IS NEEDED FOR ANY PURPOSE, NOTIFY LAB WITHIN 5 DAYS.  LOWEST DETECTABLE LIMITS FOR URINE DRUG SCREEN Drug Class                     Cutoff (ng/mL) Amphetamine and metabolites    1000 Barbiturate and metabolites    200 Benzodiazepine                 200 Opiates and metabolites        300 Cocaine and metabolites        300 THC                            50 Performed at [facility], [place] [address] [place], [place] [address]    POC Amphetamine UR 02/25/2022 None Detected  NONE DETECTED (Cut Off Level 1000 ng/mL) Final   POC Secobarbital (BAR) 02/25/2022 None Detected  NONE DETECTED (Cut Off Level 300 ng/mL) Final   POC Buprenorphine (BUP) 02/25/2022 None Detected  NONE DETECTED (Cut Off Level 10 ng/mL) Final   POC Oxazepam (BZO) 02/25/2022 None Detected  NONE DETECTED (Cut Off Level 300 ng/mL) Final   POC Cocaine UR 02/25/2022 Positive (A)  NONE DETECTED (Cut Off Level 300 ng/mL) Final   POC Methamphetamine UR 02/25/2022 None Detected  NONE  DETECTED (Cut Off Level 1000 ng/mL) Final   POC Morphine 02/25/2022 None Detected  NONE DETECTED (Cut Off Level 300 ng/mL) Final   POC Methadone UR 02/25/2022 None Detected  NONE DETECTED (Cut Off Level 300 ng/mL) Final   POC Oxycodone UR 02/25/2022 None Detected  NONE DETECTED (Cut Off Level 100 ng/mL) Final   POC Marijuana UR 02/25/2022 Positive (A)  NONE DETECTED (Cut Off Level 50 ng/mL) Final   HIV 1 RNA Quant 02/25/2022 1,030  copies/mL Corrected   Comment: (NOTE) The reportable range for this assay is 20 to 10,000,000 copies HIV-1 RNA/mL.    LOG10 HIV-1 RNA 02/25/2022 3.013  log10copy/mL Final   Comment: (NOTE) Performed At: [facility] [place], [place] [id number] Rush Farmer MD [id number]   Admission on 01/28/2022, Discharged on 01/28/2022  Component Date Value Ref Range Status   WBC 01/28/2022 6.5  4.0 - 10.5 K/uL Final   RBC 01/28/2022 4.59  4.22 - 5.81 MIL/uL Final   Hemoglobin 01/28/2022 14.1  13.0 - 17.0 g/dL Final   HCT 01/28/2022 42.0  39.0 - 52.0 % Final   MCV 01/28/2022 91.5  80.0 - 100.0 fL  Final   MCH 01/28/2022 30.7  26.0 - 34.0 pg Final   MCHC 01/28/2022 33.6  30.0 - 36.0 g/dL Final   RDW 01/28/2022 12.8  11.5 - 15.5 % Final   Platelets 01/28/2022 358  150 - 400 K/uL Final   nRBC 01/28/2022 0.0  0.0 - 0.2 % Final   Performed at [facility], [place] [address] [place], [place] [address]   Sodium 01/28/2022 139  135 - 145 mmol/L Final   Potassium 01/28/2022 4.4  3.5 - 5.1 mmol/L Final   Chloride 01/28/2022 102  98 - 111 mmol/L Final   CO2 01/28/2022 26  22 - 32 mmol/L Final   Glucose, Bld 01/28/2022 115 (H)  70 - 99 mg/dL Final   Glucose reference range applies only to samples taken after fasting for at least 8 hours.   BUN 01/28/2022 16  6 - 20 mg/dL Final   Creatinine, Ser 01/28/2022 0.96  0.61 - 1.24 mg/dL Final   Calcium 01/28/2022 9.2  8.9 - 10.3 mg/dL Final   GFR, Estimated 01/28/2022 >60  >60 mL/min Final   Comment:  (NOTE) Calculated using the CKD-EPI Creatinine Equation (2021)    Anion gap 01/28/2022 11  5 - 15 Final   Performed at [facility], [place] [address] [place], [place] [address]   D-Dimer, Quant 01/28/2022 <0.27  0.00 - 0.50 ug/mL-FEU Final   Comment: (NOTE) At the manufacturer cut-off value of 0.5 g/mL FEU, this assay has a negative predictive value of 95-100%.This assay is intended for use in conjunction with a clinical pretest probability (PTP) assessment model to exclude pulmonary embolism (PE) and deep venous thrombosis (DVT) in outpatients suspected of PE or DVT. Results should be correlated with clinical presentation. Performed at [facility], [place] [address] [place], [place] [address]   Admission on 01/27/2022, Discharged on 01/27/2022  Component Date Value Ref Range Status   SARS Coronavirus 2 by RT PCR 01/27/2022 NEGATIVE  NEGATIVE Final   Comment: (NOTE) SARS-CoV-2 target nucleic acids are NOT DETECTED.  The SARS-CoV-2 RNA is generally detectable in upper respiratory specimens during the acute phase of infection. The lowest concentration of SARS-CoV-2 viral copies this assay can detect is 138 copies/mL. A negative result does not preclude SARS-Cov-2 infection and should not be used as the sole basis for treatment or other patient management decisions. A negative result may occur with  improper specimen collection/handling, submission of specimen other than nasopharyngeal swab, presence of viral mutation(s) within the areas targeted by this assay, and inadequate number of viral copies(<138 copies/mL). A negative result must be combined with clinical observations, patient history, and epidemiological information. The expected result is Negative.  Fact Sheet for Patients:  EntrepreneurPulse.com.au  Fact Sheet for Healthcare Providers:  IncredibleEmployment.be  This test is no                          t yet approved or  cleared by the [place] FDA and  has been authorized for detection and/or diagnosis of SARS-CoV-2 by FDA under an Emergency Use Authorization (EUA). This EUA will remain  in effect (meaning this test can be used) for the duration of the COVID-19 declaration under Section 564(b)(1) of the Act, 21 U.S.C.section 360bbb-3(b)(1), unless the authorization is terminated  or revoked sooner.       Influenza A by PCR 01/27/2022 NEGATIVE  NEGATIVE Final   Influenza B by PCR 01/27/2022 NEGATIVE  NEGATIVE  Final   Comment: (NOTE) The Xpert Xpress SARS-CoV-2/FLU/RSV plus assay is intended as an aid in the diagnosis of influenza from Nasopharyngeal swab specimens and should not be used as a sole basis for treatment. Nasal washings and aspirates are unacceptable for Xpert Xpress SARS-CoV-2/FLU/RSV testing.  Fact Sheet for Patients: EntrepreneurPulse.com.au  Fact Sheet for Healthcare Providers: IncredibleEmployment.be  This test is not yet approved or cleared by the [place] FDA and has been authorized for detection and/or diagnosis of SARS-CoV-2 by FDA under an Emergency Use Authorization (EUA). This EUA will remain in effect (meaning this test can be used) for the duration of the COVID-19 declaration under Section 564(b)(1) of the Act, 21 U.S.C. section 360bbb-3(b)(1), unless the authorization is terminated or revoked.  Performed at [facility], [place] [address], [place], [place] [address]    Sodium 01/27/2022 139  135 - 145 mmol/L Final   Potassium 01/27/2022 4.5  3.5 - 5.1 mmol/L Final   Chloride 01/27/2022 101  98 - 111 mmol/L Final   CO2 01/27/2022 24  22 - 32 mmol/L Final   Glucose, Bld 01/27/2022 91  70 - 99 mg/dL Final   Glucose reference range applies only to samples taken after fasting for at least 8 hours.   BUN 01/27/2022 8  6 - 20 mg/dL Final   Creatinine, Ser 01/27/2022 0.75  0.61 - 1.24 mg/dL Final   Calcium 01/27/2022 9.1   8.9 - 10.3 mg/dL Final   GFR, Estimated 01/27/2022 >60  >60 mL/min Final   Comment: (NOTE) Calculated using the CKD-EPI Creatinine Equation (2021)    Anion gap 01/27/2022 14  5 - 15 Final   Performed at [facility], [place] [address] [place], [place] [address]   WBC 01/27/2022 7.1  4.0 - 10.5 K/uL Final   RBC 01/27/2022 4.65  4.22 - 5.81 MIL/uL Final   Hemoglobin 01/27/2022 14.0  13.0 - 17.0 g/dL Final   HCT 01/27/2022 44.6  39.0 - 52.0 % Final   MCV 01/27/2022 95.9  80.0 - 100.0 fL Final   MCH 01/27/2022 30.1  26.0 - 34.0 pg Final   MCHC 01/27/2022 31.4  30.0 - 36.0 g/dL Final   RDW 01/27/2022 12.6  11.5 - 15.5 % Final   Platelets 01/27/2022 332  150 - 400 K/uL Final   nRBC 01/27/2022 0.0  0.0 - 0.2 % Final   Neutrophils Relative % 01/27/2022 44  % Final   Neutro Abs 01/27/2022 3.2  1.7 - 7.7 K/uL Final   Lymphocytes Relative 01/27/2022 39  % Final   Lymphs Abs 01/27/2022 2.7  0.7 - 4.0 K/uL Final   Monocytes Relative 01/27/2022 11  % Final   Monocytes Absolute 01/27/2022 0.8  0.1 - 1.0 K/uL Final   Eosinophils Relative 01/27/2022 5  % Final   Eosinophils Absolute 01/27/2022 0.4  0.0 - 0.5 K/uL Final   Basophils Relative 01/27/2022 1  % Final   Basophils Absolute 01/27/2022 0.0  0.0 - 0.1 K/uL Final   Immature Granulocytes 01/27/2022 0  % Final   Abs Immature Granulocytes 01/27/2022 0.01  0.00 - 0.07 K/uL Final   Performed at [facility], [place] [address], [place], [place] [address]  Admission on 12/01/2021, Discharged on 12/01/2021  Component Date Value Ref Range Status   Sodium 12/01/2021 139  135 - 145 mmol/L Final   Potassium 12/01/2021 3.3 (L)  3.5 - 5.1 mmol/L Final   Chloride 12/01/2021 104  98 - 111 mmol/L Final  CO2 12/01/2021 26  22 - 32 mmol/L Final   Glucose, Bld 12/01/2021 78  70 - 99 mg/dL Final   Glucose reference range applies only to samples taken after fasting for at least 8 hours.   BUN 12/01/2021 11  6 - 20 mg/dL Final   Creatinine, Ser  12/01/2021 0.68  0.61 - 1.24 mg/dL Final   Calcium 12/01/2021 8.7 (L)  8.9 - 10.3 mg/dL Final   Total Protein 12/01/2021 6.6  6.5 - 8.1 g/dL Final   Albumin 12/01/2021 2.9 (L)  3.5 - 5.0 g/dL Final   AST 12/01/2021 15  15 - 41 U/L Final   ALT 12/01/2021 10  0 - 44 U/L Final   Alkaline Phosphatase 12/01/2021 61  38 - 126 U/L Final   Total Bilirubin 12/01/2021 0.4  0.3 - 1.2 mg/dL Final   GFR, Estimated 12/01/2021 >60  >60 mL/min Final   Comment: (NOTE) Calculated using the CKD-EPI Creatinine Equation (2021)    Anion gap 12/01/2021 9  5 - 15 Final   Performed at [place] [address], [place], [place] [address]   WBC 12/01/2021 7.1  4.0 - 10.5 K/uL Final   RBC 12/01/2021 3.20 (L)  4.22 - 5.81 MIL/uL Final   Hemoglobin 12/01/2021 10.1 (L)  13.0 - 17.0 g/dL Final   HCT 12/01/2021 30.6 (L)  39.0 - 52.0 % Final   MCV 12/01/2021 95.6  80.0 - 100.0 fL Final   MCH 12/01/2021 31.6  26.0 - 34.0 pg Final   MCHC 12/01/2021 33.0  30.0 - 36.0 g/dL Final   RDW 12/01/2021 13.2  11.5 - 15.5 % Final   Platelets 12/01/2021 628 (H)  150 - 400 K/uL Final   nRBC 12/01/2021 0.0  0.0 - 0.2 % Final   Neutrophils Relative % 12/01/2021 61  % Final   Neutro Abs 12/01/2021 4.3  1.7 - 7.7 K/uL Final   Lymphocytes Relative 12/01/2021 25  % Final   Lymphs Abs 12/01/2021 1.8  0.7 - 4.0 K/uL Final   Monocytes Relative 12/01/2021 11  % Final   Monocytes Absolute 12/01/2021 0.8  0.1 - 1.0 K/uL Final   Eosinophils Relative 12/01/2021 2  % Final   Eosinophils Absolute 12/01/2021 0.2  0.0 - 0.5 K/uL Final   Basophils Relative 12/01/2021 1  % Final   Basophils Absolute 12/01/2021 0.0  0.0 - 0.1 K/uL Final   Immature Granulocytes 12/01/2021 0  % Final   Abs Immature Granulocytes 12/01/2021 0.03  0.00 - 0.07 K/uL Final   Performed at [facility], [place] [address] [place], [place] [address]   Lipase 12/01/2021 29  11 - 51 U/L Final   Performed at [place] [address] [place], [place]  [address]  No results displayed because visit has over 200 results.    There may be more visits with results that are not included.    Allergies: Shellfish allergy  Medications:  Facility Ordered Medications  Medication   acetaminophen (TYLENOL) tablet 650 mg   hydrOXYzine (ATARAX) tablet 25 mg   albuterol (VENTOLIN HFA) 108 (90 Base) MCG/ACT inhaler 2 puff   bictegravir-emtricitabine-tenofovir AF (BIKTARVY) 50-200-25 MG per tablet 1 tablet   mometasone-formoterol (DULERA) 100-5 MCG/ACT inhaler 2 puff   gabapentin (NEURONTIN) capsule 200 mg   pantoprazole (PROTONIX) EC tablet 40 mg   nicotine (NICODERM CQ - dosed in mg/24 hours) patch 14 mg   QUEtiapine (SEROQUEL XR) 24 hr tablet 300 mg  traZODone (DESYREL) tablet 50 mg   PTA Medications  Medication Sig   albuterol (VENTOLIN HFA) 108 (90 Base) MCG/ACT inhaler Inhale 2 puffs into the lungs every 4 (four) hours as needed for wheezing or shortness of breath.   pantoprazole (PROTONIX) 40 MG tablet Take 1 tablet (40 mg total) by mouth daily.   gabapentin (NEURONTIN) 100 MG capsule Take 2 capsules (200 mg total) by mouth 3 (three) times daily.   QUEtiapine (SEROQUEL XR) 300 MG 24 hr tablet Take 1 tablet (300 mg total) by mouth at bedtime.   traZODone (DESYREL) 50 MG tablet Take 1 tablet (50 mg total) by mouth at bedtime as needed for sleep (sleep difficulty).   nicotine (NICODERM CQ - DOSED IN MG/24 HOURS) 14 mg/24hr patch Place 1 patch (14 mg total) onto the skin daily.   bictegravir-emtricitabine-tenofovir AF (BIKTARVY) 50-200-25 MG TABS tablet Take 1 tablet by mouth daily.   budesonide-formoterol (SYMBICORT) 80-4.5 MCG/ACT inhaler Inhale 2 puffs into the lungs in the morning and at bedtime.    Medical Decision Making  John [name] was admitted to [facility] continuous assessment unit for Substance induced mood disorder [facility]), crisis management, and stabilization. Routine labs ordered, which include Lab  Orders         Resp panel by RT-PCR (RSV, Flu A&B, Covid) Anterior Nasal Swab         CBC with Differential/Platelet         Comprehensive metabolic panel         Hemoglobin A1c         Magnesium         Ethanol         Lipid panel         TSH         Prolactin         Urinalysis, Routine w reflex microscopic -Urine, Clean Catch         POCT Urine Drug Screen - (I-Screen)         POC SARS Coronavirus 2 Ag    Medication Management: Medications started Meds ordered this encounter  Medications   acetaminophen (TYLENOL) tablet 650 mg   DISCONTD: alum & mag hydroxide-simeth (MAALOX/MYLANTA) 200-200-20 MG/5ML suspension 30 mL   DISCONTD: magnesium hydroxide (MILK OF MAGNESIA) suspension 30 mL   hydrOXYzine (ATARAX) tablet 25 mg   DISCONTD: traZODone (DESYREL) tablet 50 mg   albuterol (VENTOLIN HFA) 108 (90 Base) MCG/ACT inhaler 2 puff   bictegravir-emtricitabine-tenofovir AF (BIKTARVY) 50-200-25 MG per tablet 1 tablet   mometasone-formoterol (DULERA) 100-5 MCG/ACT inhaler 2 puff   gabapentin (NEURONTIN) capsule 200 mg   pantoprazole (PROTONIX) EC tablet 40 mg   nicotine (NICODERM CQ - dosed in mg/24 hours) patch 14 mg   QUEtiapine (SEROQUEL XR) 24 hr tablet 300 mg   traZODone (DESYREL) tablet 50 mg   Admitted to continuous assessment.  Reassess tomorrow to see if appropriate for inpatient psychiatric treatment or malingering.   Recommendations  Based on my evaluation the patient does not appear to have an emergency medical condition.  Shoaib Siefker, NP 05/11/22  12:58 PM

## 2022-05-11 NOTE — ED Notes (Signed)
Patient  sleeping in no acute stress. RR even and unlabored .Environment secured .Will continue to monitor for safely.
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
## 2022-05-11 NOTE — ED Notes (Signed)
Patient alert and oriented x 3. Denies SI/HI/AVH. Denies intent or plan to harm self or others. Routine conducted according to faculty protocol. Encourage patient to notify staff with any needs or concerns. Patient verbalized agreement and understanding. Will continue to monitor for safety.

## 2022-05-11 NOTE — ED Notes (Signed)
Pt  admitted to obs endorsing SI/HI/AVH. Calm, cooperative throughout interview process. Skin assessment completed. Oriented to unit. Meal and drink offered. At [place], pt continue to endorse SI/HI/AVH. Pt verbally contract for safety. Will monitor for safety.Belongings were put in locker

## 2022-05-12 NOTE — ED Notes (Signed)
Pt sleeping in recliner bed. RR even and unlabored. No noted distress. Will continue to monitor for safety

## 2022-05-12 NOTE — ED Provider Notes (Signed)
FBC/OBS ASAP Discharge Summary  Date and Time: 05/12/2022 2:22 PM  Name: John [name]  MRN:  [id number]   Discharge Diagnoses:  Final diagnoses:  Substance induced mood disorder ([place])  Psychoactive substance-induced psychosis ([place])  Polysubstance use disorder    Subjective: Patient states "I am okay."  Patient realizes readiness to discharge.  Tomasz is reassessed, face-to-face, by nurse practitioner.  Chart reviewed and patient discussed with attending psychiatrist, Dr. Hampton Abbot on 05/12/2022. he is reclined in observation area upon my approach, appears asleep.  He is easily awakened.  Patient is alert and oriented, pleasant and cooperative during assessment.  Patient endorses chronic, passive suicidal ideations, "for years."  He denies plan or intent to harm self.  He easily contracts verbally for safety with this Probation officer.  He denies homicidal ideations.  He denies nonsuicidal self-harm behavior.    He reports stressors include ongoing substance use disorder.  Patient declines facility based crisis and residential substance use treatment options at this time.  He states "I am not ready to stop using drugs."  Terrie's diagnoses include polysubstance abuse, adjustment disorder, cocaine use disorder, cannabis use disorder, alcoholism, bipolar disorder.  He is not linked with outpatient psychiatry currently.  He would like to follow-up with outpatient psychiatry at [facility] behavioral health moving forward.  He has access to his medications but he has not taken them, attributes this to chronic substance use.  He endorses history of multiple previous inpatient psychiatric hospitalizations.  No family mental health history reported.  Patient endorses chronic auditory and visual hallucinations.  He states "I hear voices and I see things."  No auditory or visual hallucinations currently.  There is no evidence of delusional thought content and no indication that patient is responding to  internal stimuli.  He denies symptoms of paranoia.  Patient is currently homeless since in [place], most recently resided at the [facility]  He denies access to weapons.  He receives disability income.  He is looking forward to receiving his [place] identification card.  [facility] staff have helped him apply for ID card and homeless options.  He continues to endorse alcohol and substance use.  Declines to review amount or specific substances use.  He endorses average sleep and appetite.  Patient offered support and encouragement.  He gives verbal consent to speak with his case manager, [name] phone number [phone].  Spoke with patient's case Freight forwarder.  Mr. Sallee Provencal shares that he worked with [name] for 4 years, stopped working with him briefly.  Mr. Sallee Provencal will be retiring in a few months.  He is willing to assist [place] with housing and other resources.  He plans to speak with current payee who "hands [place] money."  Ultimate plan includes having payee use patient's check to secure housing.  Also to limit access to money that can be used to purchase substances.   Patient and case manager are educated and verbalize understanding of mental health resources and other crisis services in the community. They are instructed to call 911 and present to the nearest emergency room should patient experience any suicidal/homicidal ideation, auditory/visual/hallucinations, or detrimental worsening of mental health condition.      Stay Summary: HPI completed 05/11/2022- 0846am: John [name] 52 y.o., male patient presented to [facility] via [place] with complaints of worsening depression and suicidal ideation along with auditory/visual hallucinations.     Patient seen face to face by this provider, consulted with Dr. Hampton Abbot; and chart reviewed on 05/11/22.  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  When asked what was the stress is  causing him to be suicidal patient states"to too many things, everything, life stressors, homelessness, just everything."  Patient asked if he was having any homicidal ideations.  He did spines"yes anybody and everybody.  Anything necessary to kill him with."  Patient reports that he does not currently have any outpatient psychiatric services.  States it has been over a year since he has had any psychotropic medications.  Patient reports that he is having auditory hallucinations with the voices telling him"to kill everyone, but I am going to hell, and that I do not need to be here."  Patient denies illicit drug use but states that he used cocaine yesterday.  According to chart review patient has chronic history of polysubstance abuse (alcohol, cocaine, marijuana).   During evaluation John [name] is sitting upright in chair with no noted distress.  He is alert/oriented x 4, calm, cooperative, but irritable.  His responses were appropriate to assessment questions.  His mood is irritable and dysphoric with congruent affect.  He spoke in a clear tone at moderate volume, and normal pace, with good eye contact.  Objectively:  there is no evidence of psychosis/mania or delusional thinking other than patients endorsement or auditory hallucination.  He conversed coherently, but liner information, goal directed thoughts, and no distractibility, or pre-occupation.    Total Time spent with patient: 30 minutes  Past Psychiatric History: see above Past Medical History: gout, low back pain,asthma, HIV Family History: none reported Family Psychiatric History: none reported Social History: homeless, substance use disorder Tobacco Cessation:  A prescription for an FDA-approved tobacco cessation medication provided at discharge  Current Medications:  Current Facility-Administered Medications  Medication Dose Route Frequency Provider Last Rate Last Admin   acetaminophen (TYLENOL) tablet 650 mg  650 mg Oral Q6H PRN  Rankin, Shuvon B, NP       albuterol (VENTOLIN HFA) 108 (90 Base) MCG/ACT inhaler 2 puff  2 puff Inhalation Q4H PRN Rankin, Shuvon B, NP       bictegravir-emtricitabine-tenofovir AF (BIKTARVY) 50-200-25 MG per tablet 1 tablet  1 tablet Oral Daily Rankin, Shuvon B, NP   1 tablet at 05/12/22 0800   gabapentin (NEURONTIN) capsule 200 mg  200 mg Oral TID Rankin, Shuvon B, NP   200 mg at 05/12/22 0800   hydrOXYzine (ATARAX) tablet 25 mg  25 mg Oral TID PRN Rankin, Shuvon B, NP       mometasone-formoterol (DULERA) 100-5 MCG/ACT inhaler 2 puff  2 puff Inhalation BID Rankin, Shuvon B, NP   2 puff at 05/12/22 0757   nicotine (NICODERM CQ - dosed in mg/24 hours) patch 14 mg  14 mg Transdermal Daily Rankin, Shuvon B, NP   14 mg at 05/12/22 0801   pantoprazole (PROTONIX) EC tablet 40 mg  40 mg Oral Daily Rankin, Shuvon B, NP   40 mg at 05/12/22 0800   QUEtiapine (SEROQUEL XR) 24 hr tablet 300 mg  300 mg Oral QHS Rankin, Shuvon B, NP   300 mg at 05/11/22 2126   traZODone (DESYREL) tablet 50 mg  50 mg Oral QHS PRN Rankin, Shuvon B, NP       Current Outpatient Medications  Medication Sig Dispense Refill   albuterol (VENTOLIN HFA) 108 (90 Base) MCG/ACT inhaler Inhale 2 puffs into the lungs every 4 (four) hours as needed for wheezing or shortness of breath. 18 g  1   bictegravir-emtricitabine-tenofovir AF (BIKTARVY) 50-200-25 MG TABS tablet Take 1 tablet by mouth daily. 30 tablet 0   budesonide-formoterol (SYMBICORT) 80-4.5 MCG/ACT inhaler Inhale 2 puffs into the lungs in the morning and at bedtime. 10.2 g 2   busPIRone (BUSPAR) 5 MG tablet Take 5 mg by mouth 2 (two) times daily.     gabapentin (NEURONTIN) 100 MG capsule Take 2 capsules (200 mg total) by mouth 3 (three) times daily. 180 capsule 0   mirtazapine (REMERON) 15 MG tablet Take 15 mg by mouth at bedtime.     nicotine (NICODERM CQ - DOSED IN MG/24 HOURS) 14 mg/24hr patch Place 1 patch (14 mg total) onto the skin daily. 28 patch 0   pantoprazole (PROTONIX)  40 MG tablet Take 1 tablet (40 mg total) by mouth daily. 30 tablet 0   QUEtiapine (SEROQUEL XR) 300 MG 24 hr tablet Take 1 tablet (300 mg total) by mouth at bedtime. 30 tablet 3   traZODone (DESYREL) 50 MG tablet Take 1 tablet (50 mg total) by mouth at bedtime as needed for sleep (sleep difficulty). 30 tablet 3    PTA Medications:  Facility Ordered Medications  Medication   acetaminophen (TYLENOL) tablet 650 mg   hydrOXYzine (ATARAX) tablet 25 mg   albuterol (VENTOLIN HFA) 108 (90 Base) MCG/ACT inhaler 2 puff   bictegravir-emtricitabine-tenofovir AF (BIKTARVY) 50-200-25 MG per tablet 1 tablet   mometasone-formoterol (DULERA) 100-5 MCG/ACT inhaler 2 puff   gabapentin (NEURONTIN) capsule 200 mg   pantoprazole (PROTONIX) EC tablet 40 mg   nicotine (NICODERM CQ - dosed in mg/24 hours) patch 14 mg   QUEtiapine (SEROQUEL XR) 24 hr tablet 300 mg   traZODone (DESYREL) tablet 50 mg   PTA Medications  Medication Sig   albuterol (VENTOLIN HFA) 108 (90 Base) MCG/ACT inhaler Inhale 2 puffs into the lungs every 4 (four) hours as needed for wheezing or shortness of breath.   pantoprazole (PROTONIX) 40 MG tablet Take 1 tablet (40 mg total) by mouth daily.   gabapentin (NEURONTIN) 100 MG capsule Take 2 capsules (200 mg total) by mouth 3 (three) times daily.   QUEtiapine (SEROQUEL XR) 300 MG 24 hr tablet Take 1 tablet (300 mg total) by mouth at bedtime.   traZODone (DESYREL) 50 MG tablet Take 1 tablet (50 mg total) by mouth at bedtime as needed for sleep (sleep difficulty).   nicotine (NICODERM CQ - DOSED IN MG/24 HOURS) 14 mg/24hr patch Place 1 patch (14 mg total) onto the skin daily.   bictegravir-emtricitabine-tenofovir AF (BIKTARVY) 50-200-25 MG TABS tablet Take 1 tablet by mouth daily.   budesonide-formoterol (SYMBICORT) 80-4.5 MCG/ACT inhaler Inhale 2 puffs into the lungs in the morning and at bedtime.       05/03/2022    9:49 AM 02/25/2022   11:28 AM 02/10/2021    9:33 AM  Depression screen  PHQ 2/9  Decreased Interest 0 2 0  Down, Depressed, Hopeless 0 2 0  PHQ - 2 Score 0 4 0  Altered sleeping  3   Tired, decreased energy  3   Change in appetite  3   Feeling bad or failure about yourself   3   Trouble concentrating  3   Moving slowly or fidgety/restless  3   Suicidal thoughts  3   PHQ-9 Score  25   Difficult doing work/chores  Very difficult     Flowsheet Row ED from 05[facility]11/2022 in [facility] ED to Hosp-Admission (Discharged) from 05/04/2022 in  [facility] from 03/24/2022 in [facility] Department at [place] High Risk No Risk No Risk       Musculoskeletal  Strength & Muscle Tone: within normal limits Gait & Station: normal Patient leans: N/A  Psychiatric Specialty Exam  Presentation  General Appearance:  Appropriate for Environment; Casual  Eye Contact: Good  Speech: Clear and Coherent; Normal Rate  Speech Volume: Normal  Handedness: Right   Mood and Affect  Mood: Euthymic  Affect: Appropriate; Congruent   Thought Process  Thought Processes: Goal Directed; Coherent; Linear  Descriptions of Associations:Intact  Orientation:Full (Time, Place and Person)  Thought Content:Logical; WDL  Diagnosis of Schizophrenia or Schizoaffective disorder in past: Yes  Duration of Psychotic Symptoms: Greater than six months   Hallucinations:Hallucinations: Visual; Auditory Description of Auditory Hallucinations: "I hear voices and see things"  Ideas of Reference:None  Suicidal Thoughts:Suicidal Thoughts: No SI Active Intent and/or Plan: With Intent; Without Plan  Homicidal Thoughts:Homicidal Thoughts: No   Sensorium  Memory: Immediate Good; Recent Good  Judgment: Fair  Insight: Present   Executive Functions  Concentration: Good  Attention Span: Good  Recall: Good  Fund of Knowledge: Good  Language: Good   Psychomotor Activity   Psychomotor Activity: Psychomotor Activity: Normal   Assets  Assets: Communication Skills; Desire for Improvement; Resilience; Social Support   Sleep  Sleep: Sleep: Good   Nutritional Assessment (For OBS and FBC admissions only) Has the patient had a weight loss or gain of 10 pounds or more in the last 3 months?: No Has the patient had a decrease in food intake/or appetite?: No Does the patient have dental problems?: No Does the patient have eating habits or behaviors that may be indicators of an eating disorder including binging or inducing vomiting?: No Has the patient recently lost weight without trying?: 0 Has the patient been eating poorly because of a decreased appetite?: 0 Malnutrition Screening Tool Score: 0    Physical Exam  Physical Exam Vitals and nursing note reviewed.  Constitutional:      Appearance: Normal appearance. He is well-developed and normal weight.  HENT:     Head: Normocephalic and atraumatic.     Nose: Nose normal.  Cardiovascular:     Rate and Rhythm: Normal rate.  Pulmonary:     Effort: Pulmonary effort is normal.  Musculoskeletal:        General: Normal range of motion.     Cervical back: Normal range of motion.  Skin:    General: Skin is warm and dry.  Neurological:     Mental Status: He is alert and oriented to person, place, and time.  Psychiatric:        Attention and Perception: Attention normal. He perceives auditory and visual hallucinations.        Mood and Affect: Mood and affect normal.        Speech: Speech normal.        Behavior: Behavior normal. Behavior is cooperative.        Thought Content: Thought content includes suicidal ideation.        Cognition and Memory: Cognition and memory normal.    Review of Systems  Constitutional: Negative.   HENT: Negative.    Eyes: Negative.   Respiratory: Negative.    Cardiovascular: Negative.   Gastrointestinal: Negative.   Genitourinary: Negative.   Musculoskeletal:  Negative.   Skin: Negative.   Neurological: Negative.   Psychiatric/Behavioral:  Positive for  substance abuse and suicidal ideas.    Blood pressure (!) 128/93, pulse 79, temperature 97.9 F (36.6 C), temperature source Oral, resp. rate 18, SpO2 100 %. There is no height or weight on file to calculate BMI.  Demographic Factors:  Male and Low socioeconomic status  Loss Factors: NA  Historical Factors: Prior suicide attempts  Risk Reduction Factors:   Living with another person, especially a relative, Positive social support, Positive therapeutic relationship, and Positive coping skills or problem solving skills  Continued Clinical Symptoms:  Alcohol/Substance Abuse/Dependencies Previous Psychiatric Diagnoses and Treatments  Cognitive Features That Contribute To Risk:  None    Suicide Risk:  Minimal: No identifiable suicidal ideation.  Patients presenting with no risk factors but with morbid ruminations; may be classified as minimal risk based on the severity of the depressive symptoms  Plan Of Care/Follow-up recommendations: follow-up with outpatient psychiatry at [facility] behavioral health, and appointment has been scheduled on your behalf.    Continue current medications: -Biktarvy 50 - 200 - 25 mg 1 tablet daily -Buspirone 5 mg twice daily -Gabapentin 200 mg 3 times daily -Mirtazapine 15 mg nightly -Nicotine 14 mg transdermal patch daily -Pantoprazole 40 mg daily -Quetiapine 300 mg nightly -Symbicort 80-4.5 mcg inhaler 2 puffs in the morning and at bedtime -Trazodone 50 mg nightly -Ventolin HFA 108 mcg inhale 2 puffs into the lungs every 4 hours as needed for wheezing or shortness of breath  Follow-up with substance use resources and housing resources provided.   Disposition: Discharge  Lucky Rathke, FNP 05/12/2022, 2:22 PM

## 2022-05-12 NOTE — ED Notes (Signed)
Patient A&O x 4, ambulatory. Patient discharged in no acute distress. Patient denied SI/HI, A/VH upon discharge. Patient verbalized understanding of all discharge instructions explained by staff, to include follow up appointments, RX's and safety plan. Patient reported mood 10/10.  Pt belongings returned to patient from locker # 21 intact. Patient escorted to lobby via staff for transport to destination. Safety maintained. Bus pass given.

## 2022-05-12 NOTE — Discharge Instructions (Addendum)
Patient is instructed prior to discharge to:  Take all medications as prescribed by his/her mental healthcare provider. Report any adverse effects and or reactions from the medicines to his/her outpatient provider promptly. Keep all scheduled appointments, to ensure that you are getting refills on time and to avoid any interruption in your medication.  If you are unable to keep an appointment call to reschedule.  Be sure to follow-up with resources and follow-up appointments provided.  Patient has been instructed & cautioned: To not engage in alcohol and or illegal drug use while on prescription medicines. In the event of worsening symptoms, patient is instructed to call the crisis hotline, 911 and or go to the nearest ED for appropriate evaluation and treatment of symptoms. To follow-up with his/her primary care provider for your other medical issues, concerns and or health care needs.  Information: -National Suicide Prevention Lifeline 1-800-SUICIDE or 239-049-5914.  -988 offers 24/7 access to trained crisis counselors who can help people experiencing mental health-related distress. People can call or text 988 or chat 988lifeline.org for themselves or if they are worried about a loved one who may need crisis support.    Base on the information you have provided and the presenting issue, outpatient services and resources for have been recommended.  It is imperative that you follow through with treatment recommendations within 5-7 days from the of discharge to mitigate further risk to your safety and mental well-being. A list of referrals has been provided below to get you started.  You are not limited to the list provided.  In case of an urgent crisis, you may contact the Mobile Crisis Unit with [facility] at 1.8300997934.   [facility] [place], [place], [address] [phone] phone  Offers food and emergency or transitional housing to men, women, or  families in need. Clients participate in programs and workshops developed to promote self-sufficiency and personal development.Call or walk in. Applications are accepted Monday, Wednesday, and Friday by appointment only. Need photo ID and proof of income.  [facility] [address] [place], [place] [address] [phone] Population served: Adult men & women (36 years old and older, able to perform activities for daily living) Documents required: Valid ID & [place] [address] [place], [place]  [address] [phone] Population served: Families with children  [facility] [address] [place], [place]  [address] [phone] Population served: Single women 18+ without dependents Documents required:  [place] Card  [facility] [address], [place], [place]  [address] [phone] Population served: Male veterans 18+ with substance abuse/mental health issues Eligibility: By referral only  [facility] [address] [place], [place] [address] [phone] Population served: Males 18+ Documents required: Valid ID & Social Security Card  Room at [employer] [facility] [place]. [address] [place] [address] [phone] or [phone] Population served: Pregnant women with or without children  Documents required: Valid ID & Social Security [employer] [address], [place], [place] [address] [phone] Population Served: Families with children  [facility] [address] [place],  [address] [phone] Population served: Men 18+, preference for disabled and/or veterans Eligibility: By referral only  [facility]) - Emergency Family Shelter [phone]  [place], [place], [place] [address] [phone]  or [phone] Population served: Families with children.    WOMEN ONLY  The Shelter serves up to 20 women each night. Open from December 11th through the end of March in the evenings from 5:30 pm until 7:30 am, the Shelter provides a hot evening meal, shower and sleeping facilities, and food for the next day. Secure parking is available beside the building.     Shelter Address   Directions [facility] [place], [place]  If you are in need of housing through the shelter, contact the [employer] at [phone].       Outpatient Services for Therapy and Medication Management   Based on what you have shared, a list of resources for outpatient therapy and psychiatry is provided below to get you started back on treatment.  It is imperative that you follow through with treatment within 5-7 days from the day of discharge to prevent any further risk to your safety or mental well-being.  You are not limited to the list provided.  In case of an urgent crisis, you may contact the Mobile Crisis Unit with [facility] at [phone].         [facility] [place], [place], [address] [phone] phone  New Patient Assessment/Therapy Walk-ins Monday and Wednesday: 8am until slots are full. Every 1st and 2nd Friday: 1pm - 5pm  NO ASSESSMENT/THERAPY WALK-INS ON [place]  New Patient Psychiatry/Medication Management Walk-ins Monday-Friday: 8am-11am  For all walk-ins, we ask that you arrive by 7:30am because patient will be seen in the order of arrival.  Availability is limited; therefore, you may not be seen on the same day that you walk-in.  Our goal is to serve and meet the needs of our community to the best of our ability.   [facility] [address] [place], [place], [address] [phone] phone  [facility], [place], [place], [address] [phone] phone [phone] phone ([address], AmeriHealth, Anthem/Elevance, BCBS, [place], [place], ComPsych, Healthy [place], [place], [place], [place], [place], [employer], [place], Out of Network)  [employer] [facility], [address] [place], [place], [address] [phone] phone ([place], Anthem/Elevance, [employer] Options/Carelon, [place], [place], [place], [place], [place], [employer], [place], [place], [place], [facility])  [facility] [address] [place], [place], [address] [phone] phone (Medicaid, ask about other insurance)  [facility] [address], [place], [place], [address] [phone] phone [phone] fax ([address] [place] , [place], [place], [place], UHC, [employer], Self-Pay)  [facility] [place] [place], [place], [address] [phone] phone ([address] Anthem/Elevance, [place], [place], [place], [place], [place], [employer], [place], [place], [place], [facility])  [facility] - 6-8 MONTH WAIT FOR THERAPY; [facility] FOR MEDICATION MANAGEMENT [address], [place], [place], [address] [phone] phone ([address], [place], [place], [place], [place], Friday Health Plans, [place] [employer], [place] and [place], [place], [place], [place], [place], Tricare, UHC, [employer], [place])  [facility] [address], [place], [place], [address] [phone] phone  [place] [address], [place], [place], [address] [phone] phone  [facility] [address], [place], [place], [address] [phone] phone  [facility] [place] -  THERAPY ONLY [address] [place], [address] [phone]  phone  [facility], [place] [address], [place], [address] [phone] phone  [facility] [place] [place], [place], [address] [phone] phone [phone] fax  [facility] [address] [place], [place], [address] [phone] phone [phone] fax  [facility] [phone] [address] [place], [place], [address] [phone] phone  [facility] [address] [place], [place], [address]  [phone] phone  [facility]  (Adults Only) [address] [place], [place], [address]  [phone] phone [phone] fax

## 2022-05-12 NOTE — ED Notes (Signed)
Pt reports he continues to have SI and AH. He states '' the voices are always there. They are the same. They tell me the world is awful and it's not worth living. '' Pt does report sleeping well and has been eating well. Multiple items given for breakfast including two cereal and milk, two muffin and juice and breakfast bar. Pt ambulatory in no acute distress. Compliant with am medications. Pt is safe.

## 2022-05-12 NOTE — ED Notes (Signed)
Pt sleeping at present, no distress noted.  Monitoring for safety. 

## 2022-05-18 ENCOUNTER — Ambulatory Visit: Payer: Medicaid Other

## 2022-05-20 ENCOUNTER — Telehealth: Payer: Self-pay

## 2022-05-20 NOTE — Telephone Encounter (Signed)
Detectable Viral Load Intervention   Most recent VL:  HIV 1 RNA Quant  Date Value Ref Range Status  04/20/2022 1,480 (H) copies/mL Final  03/04/2022 <20 copies/mL Corrected    Comment:    (NOTE) HIV-1 RNA detected The reportable range for this assay is 20 to 10,000,000 copies HIV-1 RNA/mL. Performed At: [facility] [place], [place] [id number] Rush Farmer MD [id number]   02/25/2022 1,030 copies/mL Corrected    Comment:    (NOTE) The reportable range for this assay is 20 to 10,000,000 copies HIV-1 RNA/mL.     Current ART regimen: Biktarvy  Appointment status: patient has future appointment scheduled  Called patient to discuss medication adherence and possible barriers to care.   Medication last dispensed (per chart review): 05/03/22   Interventions: Vinie Sill, no answer. Left HIPAA compliant voicemail requesting callback. Will send MyChart message as well.   Beryle Flock, RN

## 2022-05-26 ENCOUNTER — Ambulatory Visit (HOSPITAL_COMMUNITY): Payer: Medicaid Other | Admitting: Physician Assistant

## 2022-06-01 ENCOUNTER — Ambulatory Visit: Payer: Self-pay | Admitting: Family

## 2022-06-23 ENCOUNTER — Emergency Department (HOSPITAL_COMMUNITY): Payer: Medicaid Other

## 2022-06-23 ENCOUNTER — Encounter (HOSPITAL_COMMUNITY): Payer: Self-pay

## 2022-06-23 ENCOUNTER — Emergency Department (HOSPITAL_COMMUNITY)
Admission: EM | Admit: 2022-06-23 | Discharge: 2022-06-23 | Disposition: A | Discharge status: Home / Self Care | Payer: Medicaid Other | Attending: Emergency Medicine | Admitting: Emergency Medicine

## 2022-06-23 ENCOUNTER — Other Ambulatory Visit: Payer: Self-pay

## 2022-06-23 DIAGNOSIS — R0600 Dyspnea, unspecified: Secondary | ICD-10-CM

## 2022-06-23 DIAGNOSIS — Z5900 Homelessness unspecified: Secondary | ICD-10-CM | POA: Diagnosis not present

## 2022-06-23 DIAGNOSIS — J4521 Mild intermittent asthma with (acute) exacerbation: Secondary | ICD-10-CM

## 2022-06-23 DIAGNOSIS — I10 Essential (primary) hypertension: Secondary | ICD-10-CM | POA: Diagnosis not present

## 2022-06-23 DIAGNOSIS — R0602 Shortness of breath: Secondary | ICD-10-CM | POA: Diagnosis present

## 2022-06-23 MED ORDER — ALBUTEROL SULFATE HFA 108 (90 BASE) MCG/ACT IN AERS
1.0000 | INHALATION_SPRAY | Freq: Once | RESPIRATORY_TRACT | Status: AC
  Administered 2022-06-23: 2 via RESPIRATORY_TRACT
  Filled 2022-06-23: qty 6.7

## 2022-06-23 MED ORDER — OXYCODONE-ACETAMINOPHEN 5-325 MG PO TABS
1.0000 | ORAL_TABLET | Freq: Once | ORAL | Status: AC
  Administered 2022-06-23: 1 via ORAL
  Filled 2022-06-23: qty 1

## 2022-06-23 MED ORDER — ALBUTEROL SULFATE HFA 108 (90 BASE) MCG/ACT IN AERS: 1.0000 | INHALATION_SPRAY | Freq: Four times a day (QID) | RESPIRATORY_TRACT | 0 refills | Status: DC | PRN

## 2022-06-23 MED ORDER — IPRATROPIUM-ALBUTEROL 0.5-2.5 (3) MG/3ML IN SOLN
3.0000 mL | Freq: Once | RESPIRATORY_TRACT | Status: AC
  Administered 2022-06-23: 3 mL via RESPIRATORY_TRACT
  Filled 2022-06-23: qty 3

## 2022-06-23 MED ORDER — PREDNISONE 20 MG PO TABS
60.0000 mg | ORAL_TABLET | Freq: Once | ORAL | Status: AC
  Administered 2022-06-23: 60 mg via ORAL
  Filled 2022-06-23: qty 3

## 2022-06-23 NOTE — Discharge Instructions (Addendum)
Please take your medications as prescribed. Take tylenol/ibuprofen for pain. I recommend close follow-up with PCP for reevaluation.  Please do not hesitate to return to emergency department if worrisome signs symptoms we discussed become apparent.  

## 2022-06-23 NOTE — ED Provider Notes (Signed)
[place] EMERGENCY DEPARTMENT AT [facility] Provider Note   CSN: [id number] Arrival date & time: 06/23/22  1138     History  Chief Complaint  Patient presents with   Shortness of Breath    John [name] is a 52 y.o. male with a past medical history of asthma, alcoholism, bipolar disorder, polysubstance use disorder, homelessness, hypertension presents today for evaluation of shortness of breath.  Patient states he has had shortness of breath in the last 4 weeks.  States symptom is similar to his asthma attacks.  States he is running out of his albuterol inhaler at home.  Denies any chest pain, fever, cough, runny nose, nausea, vomiting, bowel changes, urinary symptoms.  He endorses left hip pain but denies any fall or trauma.  States he has not compliant with any of his medications because he ran out of them.  He denies SI/HI, visual or auditory hallucination.   Shortness of Breath     Past Medical History:  Diagnosis Date   Acute hypoxemic respiratory failure 05/07/2021   Adjustment disorder with depressed mood 10/28/2015   Alcoholism    Asthma    Bipolar disorder    with depression/anxiety   Cannabis use disorder, moderate, dependence 04/02/2015   Chronic low back pain    Cocaine use disorder 04/02/2015   Gout    HIV (human immunodeficiency virus infection)    "dx'd ~ 2 yr ago" (09/29/2012)   Homelessness    Hypertension    Polysubstance abuse 04/11/2019   Past Surgical History:  Procedure Laterality Date   SKIN GRAFT FULL THICKNESS LEG Left ?   POSTERIOR LEFT LEG  AFTER DOG BITES     Home Medications Prior to Admission medications   Medication Sig Start Date End Date Taking? Authorizing Provider  albuterol (VENTOLIN HFA) 108 (90 Base) MCG/ACT inhaler Inhale 2 puffs into the lungs every 4 (four) hours as needed for wheezing or shortness of breath. 11/26/21   Azucena Fallen, MD  bictegravir-emtricitabine-tenofovir AF (BIKTARVY) 50-200-25 MG TABS  tablet Take 1 tablet by mouth daily. 05/06/22   Elberta Fortis, MD  budesonide-formoterol [facility]) 80-4.5 MCG/ACT inhaler Inhale 2 puffs into the lungs in the morning and at bedtime. 05/05/22   Elberta Fortis, MD  busPIRone (BUSPAR) 5 MG tablet Take 5 mg by mouth 2 (two) times daily.    [provider]  gabapentin (NEURONTIN) 100 MG capsule Take 2 capsules (200 mg total) by mouth 3 (three) times daily. 03/06/22 05/11/22  Antionette Poles, MD  mirtazapine (REMERON) 15 MG tablet Take 15 mg by mouth at bedtime.    [provider]  nicotine (NICODERM CQ - DOSED IN MG/24 HOURS) 14 mg/24hr patch Place 1 patch (14 mg total) onto the skin daily. 05/06/22   Elberta Fortis, MD  pantoprazole (PROTONIX) 40 MG tablet Take 1 tablet (40 mg total) by mouth daily. 03/07/22 05/11/22  Antionette Poles, MD  QUEtiapine (SEROQUEL XR) 300 MG 24 hr tablet Take 1 tablet (300 mg total) by mouth at bedtime. 05/03/22 08/31/22  Veryl Speak, FNP  traZODone (DESYREL) 50 MG tablet Take 1 tablet (50 mg total) by mouth at bedtime as needed for sleep (sleep difficulty). 05/03/22 08/31/22  Veryl Speak, FNP  dicyclomine (BENTYL) 20 MG tablet Take 1 tablet (20 mg total) by mouth 2 (two) times daily. 01/08/17 01/03/20  Palumbo, April, MD  sucralfate (CARAFATE) 1 GM/10ML suspension Take 10 mLs (1 g total) by mouth 4 (four) times daily -  with meals  and at bedtime. Patient not taking: Reported on 10/21/2017 01/08/17 09/03/18  Palumbo, April, MD      Allergies    Shellfish allergy    Review of Systems   Review of Systems  Respiratory:  Positive for shortness of breath.     Physical Exam Updated Vital Signs BP (!) 140/80 (BP Location: Left Arm)   Pulse 86   Temp 98.5 F (36.9 C) (Oral)   Resp 16   SpO2 100%  Physical Exam Vitals and nursing note reviewed.  Constitutional:      Appearance: Normal appearance.  HENT:     Head: Normocephalic and atraumatic.     Mouth/Throat:     Mouth: Mucous  membranes are moist.  Eyes:     General: No scleral icterus. Cardiovascular:     Rate and Rhythm: Normal rate and regular rhythm.     Pulses: Normal pulses.     Heart sounds: Normal heart sounds.  Pulmonary:     Effort: Pulmonary effort is normal.     Breath sounds: Wheezing present.  Abdominal:     General: Abdomen is flat.     Palpations: Abdomen is soft.     Tenderness: There is no abdominal tenderness.  Musculoskeletal:        General: No deformity.  Skin:    General: Skin is warm.     Findings: No rash.  Neurological:     General: No focal deficit present.     Mental Status: He is alert.  Psychiatric:        Mood and Affect: Mood normal.    ED Results / Procedures / Treatments   Labs (all labs ordered are listed, but only abnormal results are displayed) Labs Reviewed - No data to display  EKG None  Radiology No results found.  Procedures Procedures    Medications Ordered in ED Medications  ipratropium-albuterol (DUONEB) 0.5-2.5 (3) MG/3ML nebulizer solution 3 mL (has no administration in time range)  oxyCODONE-acetaminophen (PERCOCET/ROXICET) 5-325 MG per tablet 1 tablet (has no administration in time range)  predniSONE (DELTASONE) tablet 60 mg (has no administration in time range)    ED Course/ Medical Decision Making/ A&P                             Medical Decision Making Amount and/or Complexity of Data Reviewed Radiology: ordered.  Risk Prescription drug management.   This patient presents to the ED for sob, this involves an extensive number of treatment options, and is a complaint that carries with a high risk of complications and morbidity.  The differential diagnosis includes CHF/ACS, COPD asthma, pneumonia, anaphylaxis, PE, pneumothorax, anxiety, COVID-19.  This is not an exhaustive list.  Imaging studies: I ordered imaging studies. I personally reviewed, interpreted imaging and agree with the radiologist's interpretations. The results  include: Chest x-ray negative for any acute  Problem list/ ED course/ Critical interventions/ Medical management: HPI: See above Vital signs within normal range and stable throughout visit. Laboratory/imaging studies significant for: See above. On physical examination, patient is afebrile and appears in no acute distress. This patient presents with dyspnea, most likely secondary to asthma. Presentation not consistent with acute cardiac etiologies to include ACS, CHF, pericardial effusion / tamponade . Presentation not consistent with acute respiratory etiologies to include acute PE (Wells low risk), pneumothorax, COPD exacerbation, allergic etiologies, or infectious etiologies such as PNA. Presentation also not consistent with non-cardiopulmonary causes to include toxidromes, metabolic etiologies  such as acidemia or electrolyte derangements, sepsis, neurologic causes (i.e. demyelinating diseases).  Given DuoNeb, prednisone and Percocet.  Advised patient to follow-up with primary care physician.  Strict return precaution given.  I have reviewed the patient home medicines and have made adjustments as needed.  Cardiac monitoring/EKG: The patient was maintained on a cardiac monitor.  I personally reviewed and interpreted the cardiac monitor which showed an underlying rhythm of: sinus rhythm.  Additional history obtained: External records from outside source obtained and reviewed including: Chart review including previous notes, labs, imaging.  Consultations obtained:  Disposition Continued outpatient therapy. Follow-up with PCP recommended for reevaluation of symptoms. Treatment plan discussed with patient.  Pt acknowledged understanding was agreeable to the plan. Worrisome signs and symptoms were discussed with patient, and patient acknowledged understanding to return to the ED if they noticed these signs and symptoms. Patient was stable upon discharge.   This chart was dictated using voice  recognition software.  Despite best efforts to proofread,  errors can occur which can change the documentation meaning.          Final Clinical Impression(s) / ED Diagnoses Final diagnoses:  Dyspnea, unspecified type  Mild intermittent asthma with exacerbation    Rx / DC Orders ED Discharge Orders          Ordered    albuterol (VENTOLIN HFA) 108 (90 Base) MCG/ACT inhaler  Every 6 hours PRN        06/23/22 1837              Jeanelle Malling, PA 06/23/22 Luiz Iron    Benjiman Core, MD 06/23/22 [phone]

## 2022-06-23 NOTE — ED Triage Notes (Signed)
[facility]IB [facility]. Pt has bilateral wheezing. Pt needs his inhaler he has not had in a couple weeks. Pt had 10mg  of albuterol and 0.5 Atrovent with EMS.

## 2022-07-03 IMAGING — DX DG CHEST 1V PORT
1 series · 1 of 1 positions shown · non-contrast
Comparison: Chest radiograph 01/25/2021

CLINICAL DATA: Shortness of breath

EXAM:
PORTABLE CHEST 1 VIEW

[chest ap]
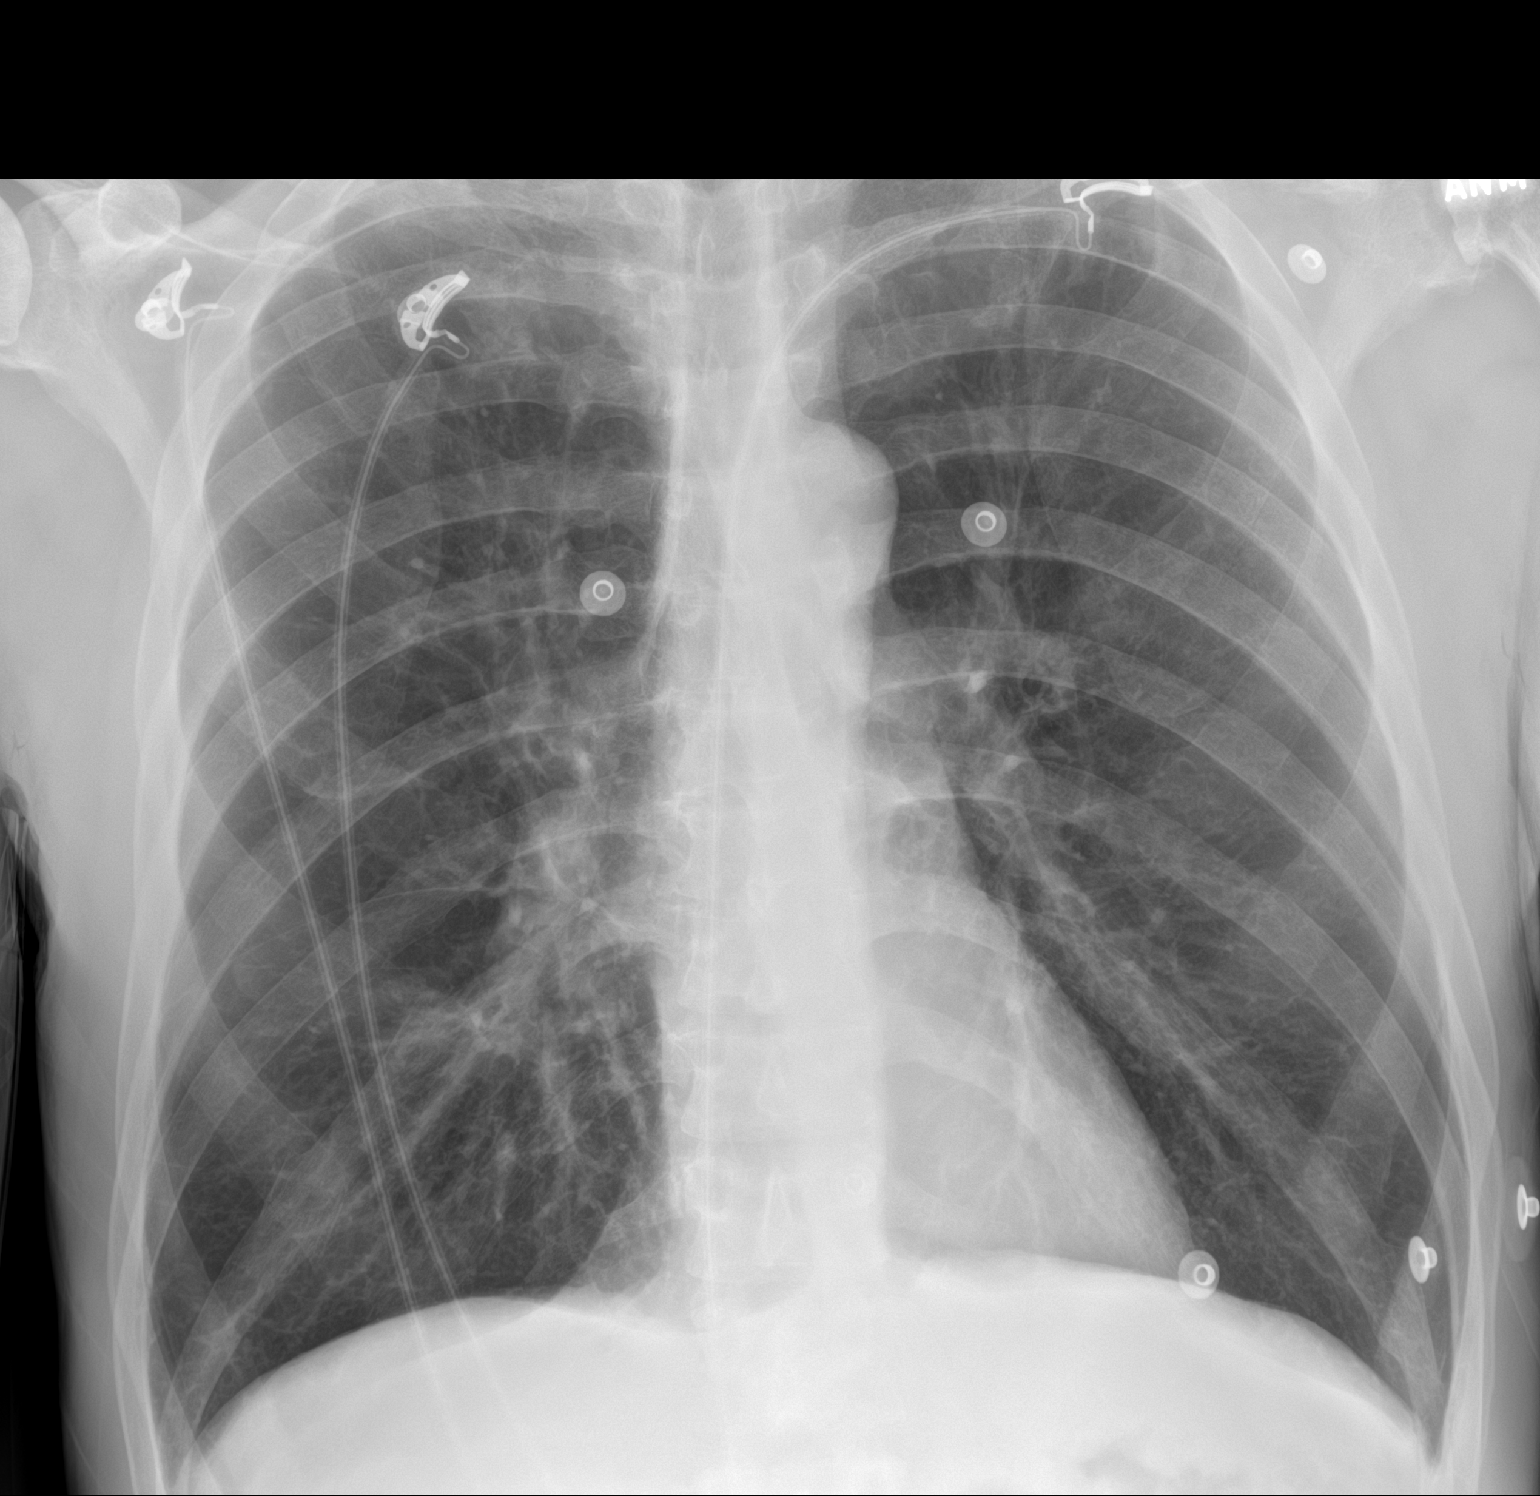

[1 of 1 positions shown; findings below may reference images not displayed]

FINDINGS: The cardiomediastinal silhouette is within normal limits.

There is no focal consolidation or pulmonary edema. There is no
pleural effusion or pneumothorax

There is no acute osseous abnormality.
IMPRESSION: No radiographic evidence of acute cardiopulmonary process.

## 2022-07-20 ENCOUNTER — Emergency Department (HOSPITAL_COMMUNITY): Payer: Medicaid Other

## 2022-07-20 ENCOUNTER — Other Ambulatory Visit: Payer: Self-pay

## 2022-07-20 ENCOUNTER — Encounter (HOSPITAL_COMMUNITY): Payer: Self-pay

## 2022-07-20 ENCOUNTER — Emergency Department (HOSPITAL_COMMUNITY)
Admission: EM | Admit: 2022-07-20 | Discharge: 2022-07-20 | Disposition: A | Discharge status: Home / Self Care | Payer: Medicaid Other | Attending: Emergency Medicine | Admitting: Emergency Medicine

## 2022-07-20 DIAGNOSIS — R0602 Shortness of breath: Secondary | ICD-10-CM

## 2022-07-20 DIAGNOSIS — Z21 Asymptomatic human immunodeficiency virus [HIV] infection status: Secondary | ICD-10-CM | POA: Diagnosis not present

## 2022-07-20 DIAGNOSIS — J4531 Mild persistent asthma with (acute) exacerbation: Secondary | ICD-10-CM | POA: Diagnosis not present

## 2022-07-20 DIAGNOSIS — R000 Tachycardia, unspecified: Secondary | ICD-10-CM | POA: Diagnosis not present

## 2022-07-20 DIAGNOSIS — Z7951 Long term (current) use of inhaled steroids: Secondary | ICD-10-CM | POA: Insufficient documentation

## 2022-07-20 DIAGNOSIS — Z5900 Homelessness unspecified: Secondary | ICD-10-CM | POA: Diagnosis not present

## 2022-07-20 DIAGNOSIS — J45901 Unspecified asthma with (acute) exacerbation: Secondary | ICD-10-CM

## 2022-07-20 LAB — BASIC METABOLIC PANEL
Anion gap: 11 (ref 5–15)
BUN: 10 mg/dL (ref 6–20)
CO2: 21 mmol/L — ABNORMAL LOW (ref 22–32)
Calcium: 8.7 mg/dL — ABNORMAL LOW (ref 8.9–10.3)
Chloride: 106 mmol/L (ref 98–111)
Creatinine, Ser: 0.82 mg/dL (ref 0.61–1.24)
GFR, Estimated: >60 mL/min (ref >60)
Glucose, Bld: 198 mg/dL — ABNORMAL HIGH (ref 70–99)
Potassium: 3.9 mmol/L (ref 3.5–5.1)
Sodium: 138 mmol/L (ref 135–145)

## 2022-07-20 LAB — CBC WITH DIFFERENTIAL/PLATELET
Abs Immature Granulocytes: 0.01 10*3/uL (ref 0.00–0.07)
Basophils Absolute: 0 10*3/uL (ref 0.0–0.1)
Basophils Relative: 1 %
Eosinophils Absolute: 0.3 10*3/uL (ref 0.0–0.5)
Eosinophils Relative: 5 %
HCT: 43.9 % (ref 39.0–52.0)
Hemoglobin: 14.3 g/dL (ref 13.0–17.0)
Immature Granulocytes: 0 %
Lymphocytes Relative: 45 %
Lymphs Abs: 2.6 10*3/uL (ref 0.7–4.0)
MCH: 30.2 pg (ref 26.0–34.0)
MCHC: 32.6 g/dL (ref 30.0–36.0)
MCV: 92.8 fL (ref 80.0–100.0)
Monocytes Absolute: 0.8 10*3/uL (ref 0.1–1.0)
Monocytes Relative: 14 %
Neutro Abs: 1.9 10*3/uL (ref 1.7–7.7)
Neutrophils Relative %: 35 %
Platelets: 341 10*3/uL (ref 150–400)
RBC: 4.73 MIL/uL (ref 4.22–5.81)
RDW: 13 % (ref 11.5–15.5)
WBC: 5.5 10*3/uL (ref 4.0–10.5)
nRBC: 0 % (ref 0.0–0.2)

## 2022-07-20 MED ORDER — ALBUTEROL SULFATE HFA 108 (90 BASE) MCG/ACT IN AERS
2.0000 | INHALATION_SPRAY | Freq: Once | RESPIRATORY_TRACT | Status: AC
  Administered 2022-07-20: 2 via RESPIRATORY_TRACT
  Filled 2022-07-20: qty 6.7

## 2022-07-20 MED ORDER — METHYLPREDNISOLONE SODIUM SUCC 125 MG IJ SOLR
125.0000 mg | Freq: Once | INTRAMUSCULAR | Status: AC
  Administered 2022-07-20: 125 mg via INTRAVENOUS
  Filled 2022-07-20: qty 2

## 2022-07-20 MED ORDER — PREDNISONE 20 MG PO TABS: 40.0000 mg | ORAL_TABLET | Freq: Every day | ORAL | 0 refills | Status: AC

## 2022-07-20 MED ORDER — AZITHROMYCIN 250 MG PO TABS: ORAL_TABLET | ORAL | 0 refills | Status: AC

## 2022-07-20 MED ORDER — IPRATROPIUM-ALBUTEROL 0.5-2.5 (3) MG/3ML IN SOLN
3.0000 mL | Freq: Once | RESPIRATORY_TRACT | Status: AC
  Administered 2022-07-20: 3 mL via RESPIRATORY_TRACT
  Filled 2022-07-20: qty 3

## 2022-07-20 NOTE — ED Notes (Signed)
Pt sitting up in bed, pt dressed and ready to go home, pt states that he is feeling better and ready for dc, pt verbalized understanding d/c and follow up, reviewed prescriptions, pt ambulatory from dpt.

## 2022-07-20 NOTE — ED Triage Notes (Signed)
Pt to er room number 5 via ems, per ems pt is here for some sob and wheezes, states that they gave a duoneb with some relief, pt talking in full sentences, pt states that since he was outside in the rain a couple he has had a cough and some sob.

## 2022-07-20 NOTE — Discharge Instructions (Addendum)
It was a pleasure take care of you today!  Your lab and imaging studies did not show any concerning findings today.  It is important that you take your antivirals [facility]) as prescribed.  Call your infectious disease specialist today to set up a follow-up appointment regarding today's ED visit.  You may follow-up with your primary care provider regarding today's ED visit.  You are being given an albuterol inhaler today, you may take 1-2 puffs every 4-6 hours as needed for your symptoms.  Also be sent a prescription for azithromycin as well as prednisone, take these medications as directed.  Return to the emergency department if you experience increasing/worsening symptoms.

## 2022-07-20 NOTE — ED Notes (Signed)
Pt in bed, pt states that he is still feeling wheezy, neb treatment up on O2

## 2022-07-20 NOTE — ED Notes (Signed)
NT walked him and O2 stayed in  the 90's

## 2022-07-20 NOTE — ED Provider Notes (Signed)
[place] EMERGENCY DEPARTMENT AT [facility] Provider Note   CSN: [id number] Arrival date & time: 07/20/22  1005     History  Chief Complaint  Patient presents with   Asthma   Shortness of Breath    John [name] is a 52 y.o. male with a past medical history of asthma, HIV, homelessness, who presents emergency department with concerns for shortness of breath onset 2 days.  Notes that he is use his inhaler without relief of symptoms.  Believes that his symptoms started due to being caught out in the rain without a jacket on.  Patient has associated cough, rhinorrhea, chest wall pain due to coughing.  No additional meds tried at home.  Patient notes that he has not taken his antivirals for his history of HIV for "a while".  He also notes he has not followed up with his infectious disease doctor in a while.  He is unsure of his viral load at this time.  Denies chest pain.  Per patient chart review: Patient was evaluated by his infectious disease specialist on 05/03/2022.  At that time noted a viral load at his previous office visit of 1080.  At that time it was discussed with patient to start back on his Biktarvy  The history is provided by the patient. No language interpreter was used.       Home Medications Prior to Admission medications   Medication Sig Start Date End Date Taking? Authorizing Provider  azithromycin (ZITHROMAX) 250 MG tablet Take 2 tablets (500 mg total) by mouth daily for 1 day, THEN 1 tablet (250 mg total) daily for 4 days. Take first 2 tablets together, then 1 every day until finished.. 07/20/22 07/25/22 Yes Meko Bellanger A, PA-C  predniSONE (DELTASONE) 20 MG tablet Take 2 tablets (40 mg total) by mouth daily for 5 days. 07/20/22 07/25/22 Yes Gwen Sarvis A, PA-C  albuterol (VENTOLIN HFA) 108 (90 Base) MCG/ACT inhaler Inhale 2 puffs into the lungs every 4 (four) hours as needed for wheezing or shortness of breath. 11/26/21   Azucena Fallen, MD  albuterol  (VENTOLIN HFA) 108 (90 Base) MCG/ACT inhaler Inhale 1-2 puffs into the lungs every 6 (six) hours as needed for wheezing or shortness of breath. 06/23/22   Jeanelle Malling, PA  bictegravir-emtricitabine-tenofovir AF (BIKTARVY) 50-200-25 MG TABS tablet Take 1 tablet by mouth daily. 05/06/22   Elberta Fortis, MD  budesonide-formoterol [facility] 80-4.5 MCG/ACT inhaler Inhale 2 puffs into the lungs in the morning and at bedtime. 05/05/22   Elberta Fortis, MD  busPIRone (BUSPAR) 5 MG tablet Take 5 mg by mouth 2 (two) times daily.    [provider]  gabapentin (NEURONTIN) 100 MG capsule Take 2 capsules (200 mg total) by mouth 3 (three) times daily. 03/06/22 05/11/22  Antionette Poles, MD  mirtazapine (REMERON) 15 MG tablet Take 15 mg by mouth at bedtime.    [provider]  nicotine (NICODERM CQ - DOSED IN MG/24 HOURS) 14 mg/24hr patch Place 1 patch (14 mg total) onto the skin daily. 05/06/22   Elberta Fortis, MD  pantoprazole (PROTONIX) 40 MG tablet Take 1 tablet (40 mg total) by mouth daily. 03/07/22 05/11/22  Antionette Poles, MD  QUEtiapine (SEROQUEL XR) 300 MG 24 hr tablet Take 1 tablet (300 mg total) by mouth at bedtime. 05/03/22 08/31/22  Veryl Speak, FNP  traZODone (DESYREL) 50 MG tablet Take 1 tablet (50 mg total) by mouth at bedtime as needed for sleep (sleep difficulty). 05/03/22 08/31/22  Veryl Speak, FNP  dicyclomine (BENTYL) 20 MG tablet Take 1 tablet (20 mg total) by mouth 2 (two) times daily. 01/08/17 01/03/20  Palumbo, April, MD  sucralfate (CARAFATE) 1 GM/10ML suspension Take 10 mLs (1 g total) by mouth 4 (four) times daily -  with meals and at bedtime. Patient not taking: Reported on 10/21/2017 01/08/17 09/03/18  Palumbo, April, MD      Allergies    Shellfish allergy    Review of Systems   Review of Systems  Respiratory:  Positive for shortness of breath.   All other systems reviewed and are negative.   Physical Exam Updated Vital Signs BP (!) 145/93 (BP  Location: Left Arm)   Pulse (!) 104   Temp 98.6 F (37 C) (Oral)   Resp 20   Ht 5\' 9"  (1.753 m)   Wt 77.1 kg   SpO2 100%   BMI 25.10 kg/m  Physical Exam Vitals and nursing note reviewed.  Constitutional:      General: He is not in acute distress.    Appearance: Normal appearance.     Comments: Able to speak in clear complete sentences  Eyes:     General: No scleral icterus.    Extraocular Movements: Extraocular movements intact.  Cardiovascular:     Rate and Rhythm: Normal rate and regular rhythm.     Pulses: Normal pulses.     Heart sounds: Normal heart sounds.  Pulmonary:     Effort: Pulmonary effort is normal. No respiratory distress.     Breath sounds: Wheezing present.     Comments: Wheezing noted throughout all lung fields.  Abdominal:     Palpations: Abdomen is soft. There is no mass.     Tenderness: There is no abdominal tenderness.  Musculoskeletal:        General: Normal range of motion.     Cervical back: Neck supple.  Skin:    General: Skin is warm and dry.     Findings: No rash.  Neurological:     Mental Status: He is alert.     Sensory: Sensation is intact.     Motor: Motor function is intact.  Psychiatric:        Behavior: Behavior normal.     ED Results / Procedures / Treatments   Labs (all labs ordered are listed, but only abnormal results are displayed) Labs Reviewed  BASIC METABOLIC PANEL - Abnormal; Notable for the following components:      Result Value   CO2 21 (*)    Glucose, Bld 198 (*)    Calcium 8.7 (*)    All other components within normal limits  CBC WITH DIFFERENTIAL/PLATELET    EKG EKG Interpretation  Date/Time:  Tuesday Jul 20 2022 10:24:37 EDT Ventricular Rate:  114 PR Interval:  131 QRS Duration: 80 QT Interval:  323 QTC Calculation: 445 R Axis:   -3 Text Interpretation: Sinus tachycardia Biatrial enlargement Anterior infarct, old increased rate from prior 4/24 Confirmed by Meridee Score [phone]) on 07/20/2022  10:45:07 AM  Radiology DG Chest 2 View  Result Date: 07/20/2022 CLINICAL DATA:  Shortness of breath and cough for several days, history asthma, HIV, smoker EXAM: CHEST - 2 VIEW COMPARISON:  06/23/2022 FINDINGS: Normal heart size, mediastinal contours, and pulmonary vascularity. Emphysematous and bronchitic changes consistent with COPD. No acute infiltrate, pleural effusion, or pneumothorax. Osseous structures unremarkable. IMPRESSION: COPD changes without acute abnormalities. Electronically Signed   By: Ulyses Southward M.D.   On: 07/20/2022 10:50  Procedures Procedures    Medications Ordered in ED Medications  ipratropium-albuterol (DUONEB) 0.5-2.5 (3) MG/3ML nebulizer solution 3 mL (3 mLs Nebulization Given 07/20/22 1025)  methylPREDNISolone sodium succinate (SOLU-MEDROL) 125 mg/2 mL injection 125 mg (125 mg Intravenous Given 07/20/22 1029)  ipratropium-albuterol (DUONEB) 0.5-2.5 (3) MG/3ML nebulizer solution 3 mL (3 mLs Nebulization Given 07/20/22 1136)  albuterol (VENTOLIN HFA) 108 (90 Base) MCG/ACT inhaler 2 puff (2 puffs Inhalation Given 07/20/22 1134)  ipratropium-albuterol (DUONEB) 0.5-2.5 (3) MG/3ML nebulizer solution 3 mL (3 mLs Nebulization Given 07/20/22 1326)    ED Course/ Medical Decision Making/ A&P Clinical Course as of 07/20/22 1825  Tue Jul 20, 2022  1123 Pt re-evaluated and noted improvement of his symptoms with treatment regimen in the ED.  Auscultation of the lungs noted improvement of air movement, wheezing still noted. [SB]  1244 Informed by nurse tech that patient ambulating and O2 sats at 90%.  Patient was asymptomatic at the time. [SB]  1338 Pt re-evaluated and noted to be eating at bedside. Pt stated that his breathing treatment completed, however, breathing treatment still noted to have medicine in it. Discussed with patient to continue with treatment. Pt agreeable and continued treatment at this time.  [SB]  1339 Re-evaluated and noted improvement of symptoms with treatment  regimen.  Discussed with patient that we will continue with 1 additional treatment.  Patient agreeable at this time. [SB]  1403 Patient reevaluated and asleep on stretcher.  Patient's lungs auscultated and improvement of wheezing, improvement of air movement with treatment regimen in the ED.  Patient notes that he feels better with treatment regimen in the ED.  Discussed with patient discharge treatment plan.  Answered all available questions.  Patient appears safe for discharge at this time. [SB]  1425 Discussion with pharmacist, Elaina Hoops who recommends Azithromycin Rx at this time.  [SB]    Clinical Course User Index [SB] Aleka Twitty A, PA-C                             Medical Decision Making Amount and/or Complexity of Data Reviewed Labs: ordered. Radiology: ordered.  Risk Prescription drug management.   Patient with acute asthma exacerbation onset 2 days. He tried his inhaler without relief. On exam patient with diffuse wheezing noted throughout all lung fields. Able to speak in clear complete sentences. No cyanosis noted, no accessory muscle use, otherwise no acute cardiovascular or abdominal exam findings.  Pt afebrile, tachycardic at 118 initially. Differential diagnosis includes asthma exacerbation, COVID, flu, RSV, viral URI with cough.  Additional history obtained:  External records from outside source obtained and reviewed including  Patient was evaluated by his infectious disease specialist on 05/03/2022.  At that time noted a viral load at his previous office visit of 1080.  At that time it was discussed with patient to start back on his Biktarvy  Labs:  I ordered, and personally interpreted labs.  The pertinent results include:   CBC unremarkable BMP with elevated glucose at 198, otherwise unremarkable  Imaging: I ordered imaging studies including CXR I independently visualized and interpreted imaging which showed  COPD changes without acute abnormalities.   I  agree with the radiologist interpretation  Medications:  I ordered medication including duoneb and solu-medrol for symptom management Reevaluation of the patient after these medicines and interventions, I reevaluated the patient and found that they have improved I have reviewed the patients home medicines and have made adjustments as needed  Disposition: Pt presentation suspicious for asthma/COPD exacerbation. After consideration of the diagnostic results and the patients response to treatment, I feel that the patient would benefit from Discharge home.  Patient ambulated in the emergency department and oxygen saturations remain in the 90s.  Patient discharged home with an albuterol inhaler.  Patient also instructed importance of taking his Biktarvy as prescribed and follow-up with his infectious disease specialist.  Patient provided with a prescription for azithromycin and prednisone. Supportive care and strict return precautions discussed with patient and family.  Patient and mother acknowledge and voices understanding. Appears safe for discharge at this time.  Follow-up as indicated in discharge paperwork.  This chart was dictated using voice recognition software, Dragon. Despite the best efforts of this provider to proofread and correct errors, errors may still occur which can change documentation meaning.  Final Clinical Impression(s) / ED Diagnoses Final diagnoses:  Shortness of breath  Exacerbation of asthma, unspecified asthma severity, unspecified whether persistent    Rx / DC Orders ED Discharge Orders          Ordered    predniSONE (DELTASONE) 20 MG tablet  Daily        07/20/22 1424    azithromycin (ZITHROMAX) 250 MG tablet  Daily        07/20/22 1424              Adrienne Delay A, PA-C 07/20/22 1825    Terrilee Files, MD 07/21/22 [phone]

## 2022-09-12 ENCOUNTER — Emergency Department (HOSPITAL_COMMUNITY)
Admission: EM | Admit: 2022-09-12 | Discharge: 2022-09-14 | Disposition: A | Discharge status: Other Acute Inpatient Hospital | Payer: Medicaid Other | Attending: Emergency Medicine | Admitting: Emergency Medicine

## 2022-09-12 ENCOUNTER — Other Ambulatory Visit: Payer: Self-pay

## 2022-09-12 ENCOUNTER — Encounter (HOSPITAL_COMMUNITY): Payer: Self-pay | Admitting: Emergency Medicine

## 2022-09-12 ENCOUNTER — Emergency Department (HOSPITAL_COMMUNITY): Payer: Medicaid Other

## 2022-09-12 DIAGNOSIS — F1420 Cocaine dependence, uncomplicated: Secondary | ICD-10-CM | POA: Diagnosis not present

## 2022-09-12 DIAGNOSIS — Z1152 Encounter for screening for COVID-19: Secondary | ICD-10-CM | POA: Diagnosis not present

## 2022-09-12 DIAGNOSIS — F1220 Cannabis dependence, uncomplicated: Secondary | ICD-10-CM | POA: Diagnosis not present

## 2022-09-12 DIAGNOSIS — J45901 Unspecified asthma with (acute) exacerbation: Secondary | ICD-10-CM

## 2022-09-12 DIAGNOSIS — F333 Major depressive disorder, recurrent, severe with psychotic symptoms: Secondary | ICD-10-CM | POA: Diagnosis present

## 2022-09-12 DIAGNOSIS — R45851 Suicidal ideations: Secondary | ICD-10-CM | POA: Diagnosis not present

## 2022-09-12 DIAGNOSIS — J45909 Unspecified asthma, uncomplicated: Secondary | ICD-10-CM | POA: Insufficient documentation

## 2022-09-12 LAB — COMPREHENSIVE METABOLIC PANEL
ALT: 14 U/L (ref 0–44)
AST: 23 U/L (ref 15–41)
Albumin: 3.8 g/dL (ref 3.5–5.0)
Alkaline Phosphatase: 83 U/L (ref 38–126)
Anion gap: 11 (ref 5–15)
BUN: 15 mg/dL (ref 6–20)
CO2: 23 mmol/L (ref 22–32)
Calcium: 9.1 mg/dL (ref 8.9–10.3)
Chloride: 105 mmol/L (ref 98–111)
Creatinine, Ser: 0.93 mg/dL (ref 0.61–1.24)
GFR, Estimated: >60 mL/min (ref >60)
Glucose, Bld: 90 mg/dL (ref 70–99)
Potassium: 3.9 mmol/L (ref 3.5–5.1)
Sodium: 139 mmol/L (ref 135–145)
Total Bilirubin: 0.8 mg/dL (ref 0.3–1.2)
Total Protein: 7.8 g/dL (ref 6.5–8.1)

## 2022-09-12 LAB — CBC
HCT: 47.6 % (ref 39.0–52.0)
Hemoglobin: 15.6 g/dL (ref 13.0–17.0)
MCH: 30.6 pg (ref 26.0–34.0)
MCHC: 32.8 g/dL (ref 30.0–36.0)
MCV: 93.3 fL (ref 80.0–100.0)
Platelets: 378 10*3/uL (ref 150–400)
RBC: 5.1 MIL/uL (ref 4.22–5.81)
RDW: 12.5 % (ref 11.5–15.5)
WBC: 4.4 10*3/uL (ref 4.0–10.5)
nRBC: 0 % (ref 0.0–0.2)

## 2022-09-12 LAB — RAPID URINE DRUG SCREEN, HOSP PERFORMED
Amphetamines: NOT DETECTED
Barbiturates: NOT DETECTED
Benzodiazepines: NOT DETECTED
Cocaine: POSITIVE — AB
Opiates: NOT DETECTED
Tetrahydrocannabinol: POSITIVE — AB

## 2022-09-12 LAB — SARS CORONAVIRUS 2 BY RT PCR: SARS Coronavirus 2 by RT PCR: NEGATIVE

## 2022-09-12 LAB — ETHANOL: Alcohol, Ethyl (B): <10 mg/dL (ref <10)

## 2022-09-12 MED ORDER — LORAZEPAM 1 MG PO TABS: 0.0000 mg | ORAL_TABLET | Freq: Four times a day (QID) | ORAL | Status: DC

## 2022-09-12 MED ORDER — THIAMINE MONONITRATE 100 MG PO TABS
100.0000 mg | ORAL_TABLET | Freq: Every day | ORAL | Status: DC
  Administered 2022-09-12 – 2022-09-14 (×3): 100 mg via ORAL
  Filled 2022-09-12 (×3): qty 1

## 2022-09-12 MED ORDER — THIAMINE HCL 100 MG/ML IJ SOLN: 100.0000 mg | Freq: Every day | INTRAMUSCULAR | Status: DC

## 2022-09-12 MED ORDER — MIRTAZAPINE 15 MG PO TABS
15.0000 mg | ORAL_TABLET | Freq: Every day | ORAL | Status: DC
  Administered 2022-09-12 – 2022-09-13 (×2): 15 mg via ORAL
  Filled 2022-09-12 (×2): qty 1

## 2022-09-12 MED ORDER — PREDNISONE 20 MG PO TABS
40.0000 mg | ORAL_TABLET | Freq: Every day | ORAL | Status: DC
  Administered 2022-09-12 – 2022-09-14 (×3): 40 mg via ORAL
  Filled 2022-09-12 (×3): qty 2

## 2022-09-12 MED ORDER — QUETIAPINE FUMARATE ER 300 MG PO TB24
300.0000 mg | ORAL_TABLET | Freq: Every day | ORAL | Status: DC
  Administered 2022-09-12 – 2022-09-13 (×2): 300 mg via ORAL
  Filled 2022-09-12 (×3): qty 1

## 2022-09-12 MED ORDER — BUSPIRONE HCL 10 MG PO TABS
5.0000 mg | ORAL_TABLET | Freq: Two times a day (BID) | ORAL | Status: DC
  Administered 2022-09-12 – 2022-09-14 (×5): 5 mg via ORAL
  Filled 2022-09-12 (×5): qty 1

## 2022-09-12 MED ORDER — BICTEGRAVIR-EMTRICITAB-TENOFOV 50-200-25 MG PO TABS
1.0000 | ORAL_TABLET | Freq: Every day | ORAL | Status: DC
  Administered 2022-09-12 – 2022-09-13 (×2): 1 via ORAL
  Filled 2022-09-12 (×4): qty 1

## 2022-09-12 MED ORDER — ALBUTEROL SULFATE HFA 108 (90 BASE) MCG/ACT IN AERS
2.0000 | INHALATION_SPRAY | RESPIRATORY_TRACT | Status: DC | PRN
  Administered 2022-09-12: 2 via RESPIRATORY_TRACT
  Filled 2022-09-12: qty 6.7

## 2022-09-12 MED ORDER — GABAPENTIN 100 MG PO CAPS
200.0000 mg | ORAL_CAPSULE | Freq: Three times a day (TID) | ORAL | Status: DC
  Administered 2022-09-12 – 2022-09-14 (×7): 200 mg via ORAL
  Filled 2022-09-12 (×7): qty 2

## 2022-09-12 MED ORDER — MOMETASONE FURO-FORMOTEROL FUM 100-5 MCG/ACT IN AERO
2.0000 | INHALATION_SPRAY | Freq: Two times a day (BID) | RESPIRATORY_TRACT | Status: DC
  Administered 2022-09-12 – 2022-09-14 (×5): 2 via RESPIRATORY_TRACT
  Filled 2022-09-12: qty 8.8

## 2022-09-12 NOTE — ED Notes (Signed)
Pt given night time snack of crackers, ginger ale & peanut butter.

## 2022-09-12 NOTE — Progress Notes (Signed)
Pt meets inpatient behavioral health placement per Shearon Stalls. CSW requested [facility] to review pt for [facility]). CSW/Disposition team will follow up.  Maryjean Ka, MSW, LCSWA 09/12/2022 5:03 PM

## 2022-09-12 NOTE — ED Triage Notes (Signed)
Patient arrived via EMS from homeless shelter with complaints of shortness of breath x3 days and feeling suicidal/homicidal since yesterday. Patients states out of meds for about a month that makes him feel depressed and wants to harm himself and others. States he wants to go to mental health facility. Patient was given neb.  5mg  albuterol and 0.5 mg Atrovent by fire department.   Vital per EMS BP-126/82 RR-22 Spo2-99% on room air

## 2022-09-12 NOTE — Progress Notes (Addendum)
LCSW Progress Note  [id number]   John [name]  09/12/2022  5:51 PM    Inpatient Behavioral Health Placement  Pt meets inpatient criteria per Arsenio Loader, NP. There are no available beds within [facility] per [facility] Erica Wright,RN. Referral was sent to the following facilities;    Destination  Service Provider Address Phone Fax  [facility]  [address] [place], [place] [address] [phone]  [facility]  [address] [place] [address] [phone]  [facility]  [address] [place] [address] [phone]  [facility]  [address], [place] [address] [phone]  [facility]  [address] [place] [address] [phone]  [facility]  [address] [place], [place] [address] [phone]  [facility]  [address], [place] [address] [phone]  [facility]  [address] [place] [address] [phone]  [facility]  [address] [place] [address] [phone]  [facility]  [address] [place] [address] [phone]  [facility]  [address] [place] [address] [phone]  [facility]  [address] [place] [address] [phone]  [facility]  [address] [place] [address] [phone]  [facility]  [address] [place] [address] [phone]  [facility]  [address], [place] [address] [phone]  [facility]  [address] [place] [address] [phone]  [facility]  [address], [place] [address] [phone]  [facility]  [address] [place] [address] [phone]  [facility]  [address] [place] [address] [phone]  [facility]  [address] [place] [address] [phone]  [facility]  [address], [place] [address] [phone]  [facility]  [address] [place] [address] [phone]  [facility] [address]  [address] [place] [address] [phone]  [facility]  [phone] [place] [address] [phone]  [facility]  [address], [place] [address] [phone]  [facility]  [address] [place] [address] [phone]  [facility]  [address], [place] [address] [phone]   Situation ongoing,  CSW will follow up.    Maryjean Ka, MSW, [facility] 09/12/2022 5:51 PM

## 2022-09-12 NOTE — Consult Note (Addendum)
BH ED ASSESSMENT   Reason for Consult:  Psych Consult Referring Physician:  Benjiman Core, MD  Patient Identification: John [name] MRN:  [id number] ED Chief Complaint: MDD (major depressive disorder), recurrent, severe, with psychosis (HCC)  Diagnosis:  Principal Problem:   MDD (major depressive disorder), recurrent, severe, with psychosis (HCC) Active Problems:   Cannabis use disorder, severe, dependence (HCC)   Cocaine use disorder, severe, dependence (HCC)   ED Assessment Time Calculation: Start Time: 1000 Stop Time: 1049 Total Time in Minutes (Assessment Completion): 49   Subjective:   John [name] is a 52 y.o. AA male history of with a past psychiatric history of MDD, cocaine use disorder, cannabis use disorder, alcohol use disorder, GAD with panic attacks, PTSD, bipolar, and tobacco use disorder, with pertinent medical comorbidities that include asthma and HIV, who presents this encounter by way of EMS for exacerbation of asthma and difficulty breathing, command auditory hallucinations due to medication noncompliance, decompensation of the patient's overall mental health, and expressions of homicidal and suicidal ideations. Patient is homeless.  Patient currently medically cleared and voluntary.  HPI:   Patient seen today for face-to-face evaluation at [facility] emergency department.  Patient tells me that he has been having increased psychosocial stressors lately, reports that he has been staying at [facility] and has shelter, but has not had his medications in about a month for mental health, and currently is experiencing severe depression, anxiety, increased irritability, and auditory command hallucinations like he has in the past telling him to kill everyone around him and kill himself "I hear voices that tell me to kill myself by any means necessary and to kill everyone around me."  Patient reports that because of the voices, he has passive suicidal ideations, endorses that  he has intent, but no active plan, means, and/or access, denies access to firearms, knives, and/or poison. Patient endorses he is goal-directed towards inpatient hospitalization, feels like he needs to get back on his medications, otherwise he states that he will "given to" command auditory hallucinations. Patient orientation is intact.  Objectively, patient does not present with any psychotic features that are appreciable, paranoid ideations, and/or delusional themes.  Patient endorses multiple depressive symptoms and anxiety symptoms.  Patient endorses last suicide attempt was in 1994, points to his right [facility] joint and shows this Clinical research associate a scar where he states he lacerated his arm in an attempt to end his life.  Patient endorses that he has been using cocaine and marijuana, denies any EtOH use for several weeks.  Patient endorses psychotic features outside of substance use, endorses that depressive symptoms correlate with hearing things. Patient endorses no current psychiatric outpatient provider.   Past Psychiatric History: MDD, cocaine use disorder, cannabis use disorder, alcohol use disorder, GAD with panic attacks, PTSD, bipolar, and tobacco use disorder  Risk to Self or Others: Is the patient at risk to self? Yes Has the patient been a risk to self in the past 6 months? Yes Has the patient been a risk to self within the distant past? Yes Is the patient a risk to others? Yes Has the patient been a risk to others in the past 6 months? Yes Has the patient been a risk to others within the distant past? Yes  [place] Scale:  Flowsheet Row ED from 09/12/2022 in [facility] Department at [facility] ED from 07/20/2022 in [facility] Department at [facility] ED from 06/23/2022 in [facility] Department at [facility]  C-SSRS RISK CATEGORY High Risk No Risk No Risk       Substance Abuse: Reports EtOH history but none currently, severe cannabis use,  severe cocaine use  Past Medical History:  Past Medical History:  Diagnosis Date   Acute hypoxemic respiratory failure (HCC) 05/07/2021   Adjustment disorder with depressed mood 10/28/2015   Alcoholism (HCC)    Asthma    Bipolar disorder (HCC)    with depression/anxiety   Cannabis use disorder, moderate, dependence (HCC) 04/02/2015   Chronic low back pain    Cocaine use disorder (HCC) 04/02/2015   Gout    HIV (human immunodeficiency virus infection) (HCC)    "dx'd ~ 2 yr ago" (09/29/2012)   Homelessness    Hypertension    Polysubstance abuse (HCC) 04/11/2019    Past Surgical History:  Procedure Laterality Date   SKIN GRAFT FULL THICKNESS LEG Left ?   POSTERIOR LEFT LEG  AFTER DOG BITES   Family History:  Family History  Problem Relation Age of Onset   Alcoholism Mother    Depression Mother    Alcoholism Brother    Family Psychiatric  History: None endorsed Social History:  Social History   Substance and Sexual Activity  Alcohol Use Yes   Alcohol/week: 12.0 standard drinks of alcohol   Types: 12 Cans of beer per week     Social History   Substance and Sexual Activity  Drug Use Yes   Types: Marijuana, "Crack" cocaine    Social History   Socioeconomic History   Marital status: Single    Spouse name: Not on file   Number of children: 0   Years of education: Not on file   Highest education level: 9th grade  Occupational History   Occupation: disabled  Tobacco Use   Smoking status: Every Day    Packs/day: 0.50    Years: 27.00    Additional pack years: 0.00    Total pack years: 13.50    Types: Cigarettes   Smokeless tobacco: Never  Vaping Use   Vaping Use: Never used  Substance and Sexual Activity   Alcohol use: Yes    Alcohol/week: 12.0 standard drinks of alcohol    Types: 12 Cans of beer per week   Drug use: Yes    Types: Marijuana, "Crack" cocaine   Sexual activity: Not Currently    Partners: Male    Birth control/protection: Condom    Comment:  given condoms 01/2021  Other Topics Concern   Not on file  Social History Narrative   ** Merged History Encounter **       ** Merged History Encounter **       Social Determinants of Health   Financial Resource Strain: High Risk (05/22/2020)   Overall Financial Resource Strain (CARDIA)    Difficulty of Paying Living Expenses: Very hard  Food Insecurity: Food Insecurity Present (05/04/2022)   Hunger Vital Sign    Worried About Running Out of Food in the Last Year: Often true    Ran Out of Food in the Last Year: Often true  Transportation Needs: Unmet Transportation Needs (05/04/2022)   PRAPARE - Transportation    Lack of Transportation (Medical): Yes    Lack of Transportation (Non-Medical): Yes  Physical Activity: Inactive (05/22/2020)   Exercise Vital Sign    Days of Exercise per Week: 0 days    Minutes of Exercise per Session: 0 min  Stress: Stress Concern Present (05/22/2020)   [employer] of Occupational Health - Occupational Stress  Questionnaire    Feeling of Stress : Rather much  Social Connections: Moderately Isolated (05/22/2020)   Social Connection and Isolation Panel [NHANES]    Frequency of Communication with Friends and Family: Three times a week    Frequency of Social Gatherings with Friends and Family: Three times a week    Attends Religious Services: Never    Active Member of Clubs or Organizations: Yes    Attends Engineer, structural: More than 4 times per year    Marital Status: Never married   Additional Social History:    Allergies:   Allergies  Allergen Reactions   Shellfish Allergy Anaphylaxis and Swelling    Labs:  Results for orders placed or performed during the hospital encounter of 09/12/22 (from the past 48 hour(s))  SARS Coronavirus 2 by RT PCR (hospital order, performed in [facility] hospital lab) *cepheid single result test* Anterior Nasal Swab     Status: None   Collection Time: 09/12/22  7:53 AM   Specimen: Anterior Nasal  Swab  Result Value Ref Range   SARS Coronavirus 2 by RT PCR NEGATIVE NEGATIVE    Comment: Performed at [facility], [address], [place], [place] [address]  Comprehensive metabolic panel     Status: None   Collection Time: 09/12/22  7:58 AM  Result Value Ref Range   Sodium 139 135 - 145 mmol/L   Potassium 3.9 3.5 - 5.1 mmol/L   Chloride 105 98 - 111 mmol/L   CO2 23 22 - 32 mmol/L   Glucose, Bld 90 70 - 99 mg/dL    Comment: Glucose reference range applies only to samples taken after fasting for at least 8 hours.   BUN 15 6 - 20 mg/dL   Creatinine, Ser 6.04 0.61 - 1.24 mg/dL   Calcium 9.1 8.9 - 54.0 mg/dL   Total Protein 7.8 6.5 - 8.1 g/dL   Albumin 3.8 3.5 - 5.0 g/dL   AST 23 15 - 41 U/L   ALT 14 0 - 44 U/L   Alkaline Phosphatase 83 38 - 126 U/L   Total Bilirubin 0.8 0.3 - 1.2 mg/dL   GFR, Estimated >98 >11 mL/min    Comment: (NOTE) Calculated using the CKD-EPI Creatinine Equation (2021)    Anion gap 11 5 - 15    Comment: Performed at [facility], [address], [place], [place] [address]  Ethanol     Status: None   Collection Time: 09/12/22  7:58 AM  Result Value Ref Range   Alcohol, Ethyl (B) <10 <10 mg/dL    Comment: (NOTE) Lowest detectable limit for serum alcohol is 10 mg/dL.  For medical purposes only. Performed at [facility], [address] [place], [place] [address]   CBC     Status: None   Collection Time: 09/12/22  7:58 AM  Result Value Ref Range   WBC 4.4 4.0 - 10.5 K/uL   RBC 5.10 4.22 - 5.81 MIL/uL   Hemoglobin 15.6 13.0 - 17.0 g/dL   HCT 13.0 86.5 - 78.4 %   MCV 93.3 80.0 - 100.0 fL   MCH 30.6 26.0 - 34.0 pg   MCHC 32.8 30.0 - 36.0 g/dL   RDW 69.6 29.5 - 28.4 %   Platelets 378 150 - 400 K/uL   nRBC 0.0 0.0 - 0.2 %    Comment: Performed at [facility], [address] [place], [place] [address]  Urine rapid drug screen (hosp  performed)     Status: Abnormal   Collection Time: 09/12/22  9:40 AM  Result Value Ref Range    Opiates NONE DETECTED NONE DETECTED   Cocaine POSITIVE (A) NONE DETECTED   Benzodiazepines NONE DETECTED NONE DETECTED   Amphetamines NONE DETECTED NONE DETECTED   Tetrahydrocannabinol POSITIVE (A) NONE DETECTED   Barbiturates NONE DETECTED NONE DETECTED    Comment: (NOTE) DRUG SCREEN FOR MEDICAL PURPOSES ONLY.  IF CONFIRMATION IS NEEDED FOR ANY PURPOSE, NOTIFY LAB WITHIN 5 DAYS.  LOWEST DETECTABLE LIMITS FOR URINE DRUG SCREEN Drug Class                     Cutoff (ng/mL) Amphetamine and metabolites    1000 Barbiturate and metabolites    200 Benzodiazepine                 200 Opiates and metabolites        300 Cocaine and metabolites        300 THC                            50 Performed at [facility], [address], [place], [place] [address]     Current Facility-Administered Medications  Medication Dose Route Frequency Provider Last Rate Last Admin   albuterol (VENTOLIN HFA) 108 (90 Base) MCG/ACT inhaler 2 puff  2 puff Inhalation Q4H PRN Benjiman Core, MD   2 puff at 09/12/22 0758   bictegravir-emtricitabine-tenofovir AF (BIKTARVY) 50-200-25 MG per tablet 1 tablet  1 tablet Oral Daily Benjiman Core, MD   1 tablet at 09/12/22 1044   busPIRone (BUSPAR) tablet 5 mg  5 mg Oral BID Benjiman Core, MD   5 mg at 09/12/22 1043   LORazepam (ATIVAN) tablet 0-4 mg  0-4 mg Oral Q6H Benjiman Core, MD       mirtazapine (REMERON) tablet 15 mg  15 mg Oral QHS Benjiman Core, MD       mometasone-formoterol [facility]) 100-5 MCG/ACT inhaler 2 puff  2 puff Inhalation BID Benjiman Core, MD   2 puff at 09/12/22 1044   predniSONE (DELTASONE) tablet 40 mg  40 mg Oral Daily Benjiman Core, MD   40 mg at 09/12/22 3086   QUEtiapine (SEROQUEL XR) 24 hr tablet 300 mg  300 mg Oral QHS Benjiman Core, MD       thiamine (VITAMIN B1) tablet 100 mg  100 mg Oral Daily Benjiman Core, MD   100 mg at 09/12/22 1043   Or   thiamine (VITAMIN B1) injection 100 mg  100 mg  Intravenous Daily Benjiman Core, MD       Current Outpatient Medications  Medication Sig Dispense Refill   albuterol (VENTOLIN HFA) 108 (90 Base) MCG/ACT inhaler Inhale 2 puffs into the lungs every 4 (four) hours as needed for wheezing or shortness of breath. 18 g 1   albuterol (VENTOLIN HFA) 108 (90 Base) MCG/ACT inhaler Inhale 1-2 puffs into the lungs every 6 (six) hours as needed for wheezing or shortness of breath. 8 g 0   bictegravir-emtricitabine-tenofovir AF (BIKTARVY) 50-200-25 MG TABS tablet Take 1 tablet by mouth daily. 30 tablet 0   budesonide-formoterol (SYMBICORT) 80-4.5 MCG/ACT inhaler Inhale 2 puffs into the lungs in the morning and at bedtime. 10.2 g 2   busPIRone (BUSPAR) 5 MG tablet Take 5 mg by mouth 2 (two) times daily.     gabapentin (NEURONTIN) 100 MG capsule Take 2  capsules (200 mg total) by mouth 3 (three) times daily. 180 capsule 0   mirtazapine (REMERON) 15 MG tablet Take 15 mg by mouth at bedtime.     nicotine (NICODERM CQ - DOSED IN MG/24 HOURS) 14 mg/24hr patch Place 1 patch (14 mg total) onto the skin daily. 28 patch 0   pantoprazole (PROTONIX) 40 MG tablet Take 1 tablet (40 mg total) by mouth daily. 30 tablet 0   QUEtiapine (SEROQUEL XR) 300 MG 24 hr tablet Take 1 tablet (300 mg total) by mouth at bedtime. 30 tablet 3   traZODone (DESYREL) 50 MG tablet Take 1 tablet (50 mg total) by mouth at bedtime as needed for sleep (sleep difficulty). 30 tablet 3    Musculoskeletal: Strength & Muscle Tone: within normal limits Gait & Station: normal Patient leans: N/A   Psychiatric Specialty Exam: Presentation  General Appearance:  Disheveled  Eye Contact: Poor  Speech: Clear and Coherent; Normal Rate  Speech Volume: Normal  Handedness: Right   Mood and Affect  Mood: Irritable; Depressed; Dysphoric  Affect: Other (comment) (Neutral to irritable)   Thought Process  Thought Processes: Linear; Goal Directed  Descriptions of  Associations:Intact  Orientation:Full (Time, Place and Person)  Thought Content:Logical; Other (comment) (Fixated)  History of Schizophrenia/Schizoaffective disorder:Yes  Duration of Psychotic Symptoms:Greater than six months  Hallucinations:Hallucinations: Auditory Description of Auditory Hallucinations: "I hear voices that tell me to kill myself any means necessary and to kill everyone around me"  Ideas of Reference:None  Suicidal Thoughts:Suicidal Thoughts: Yes, Passive SI Passive Intent and/or Plan: With Intent; Without Plan; Without Means to Carry Out; Without Access to Means  Homicidal Thoughts:Homicidal Thoughts: Yes, Passive HI Passive Intent and/or Plan: With Intent; Without Plan; Without Means to Carry Out; Without Access to Means   Sensorium  Memory: Immediate Good; Recent Good; Remote Good  Judgment: Fair  Insight: Present   Executive Functions  Concentration: Fair  Attention Span: Fair  Recall: [employer] of Knowledge: Fair  Language: Fair   Psychomotor Activity  Psychomotor Activity: Psychomotor Activity: Normal   Assets  Assets: Communication Skills; Desire for Improvement; Financial Resources/Insurance; Physical Health; Resilience; Social Support    Sleep  Sleep: Sleep: Poor   Physical Exam: Physical Exam Vitals and nursing note reviewed.  Constitutional:      General: He is not in acute distress.    Appearance: He is not ill-appearing, toxic-appearing or diaphoretic.  Pulmonary:     Effort: Pulmonary effort is normal.  Neurological:     Mental Status: He is alert and oriented to person, place, and time.    Review of Systems  Psychiatric/Behavioral:  Positive for depression, hallucinations, substance abuse and suicidal ideas. The patient is nervous/anxious.   All other systems reviewed and are negative.  Blood pressure 110/68, pulse 78, temperature 98.7 F (37.1 C), temperature source Oral, resp. rate 20, SpO2 98 %.  There is no height or weight on file to calculate BMI.  Medical Decision Making:  Patient presents with symptomology that is most indicative of an exacerbation of the patient's current and past diagnosis of MDD with psychotic features, cocaine use disorder, and cannabis use disorder. Patient endorses a variety of psychosocial stressors, along with medication noncompliance, which she states is causing him to have worsening depressive mood, anxiety, stress, suicidal and homicidal ideations, and command auditory hallucinations that are depressive in nature.  Patient at this time meets inpatient criteria, psychiatry team will seek disposition, as well as follow the patient until disposition is  obtained.  Patient meets inpatient criteria, currently voluntary, however, reports he is amenable to treatment.  Patient reports he would like to restart previous medications started.  Recommendations  #MDD (major depressive disorder), recurrent, severe, with psychosis (HCC) #Cannabis use disorder, severe, dependence (HCC) #Cocaine use disorder, severe, dependence (HCC)  -Restart previous psychotropic medications  --> Seroquel 300 mg nightly, Remeron 15 mg nightly, BuSpar 5 mg twice daily, gabapentin 200 mg 3 times daily -Recommend inpatient hospitalization for safety, stabilization, and substance use -Recommend IVC, if patient chooses to attempt to leave  Disposition: Recommend psychiatric Inpatient admission when medically cleared.  Lenox Ponds, NP 09/12/2022 11:08 AM

## 2022-09-12 NOTE — ED Notes (Signed)
Pt belongings placed in locker#1 
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
## 2022-09-13 DIAGNOSIS — F333 Major depressive disorder, recurrent, severe with psychotic symptoms: Secondary | ICD-10-CM

## 2022-09-13 NOTE — Progress Notes (Signed)
Pt was accepted to [facility] 09/14/2022. Bed assignment: Main campus.  Pt meets inpatient criteria per Eligha Bridegroom, NP  Attending Physician will be Loni Beckwith, MD  Report can be called to: [phone] (this is a pager, please leave call-back number when giving report)  Pt can arrive after 8 AM, please send pt with his inhaler  Care Team Notified: Eligha Bridegroom, NP and Denton Ar, RN  Cathie Beams, LCSW  09/13/2022 10:37 AM

## 2022-09-13 NOTE — Progress Notes (Signed)
LCSW Progress Note  [id number]   Corrin Hubbart  09/13/2022  10:11 AM  Description:   Inpatient Psychiatric Referral  Patient was recommended inpatient per Eligha Bridegroom, NP. There are no available beds at [facility], per [facility], RN. Patient was referred to the following out of network facilities:   Destination  Service Provider Address Phone Fax  [facility]  [address] [place], [place] [address] [phone]  [facility] [address] [place]  [address] [place] [address] [phone]  [facility]  [address] [place] [address] [phone]  [facility]  [address], [place] [address] [phone]  [facility]  [address] [place] [address] [phone]  [facility]  [address] [place], [place] [address] [phone]  [facility]  [address] [place] [address] [phone]  [facility]  [address] [place] [address] [phone]  [facility]  [address], [place] [address] [phone]  [facility]  [address] [place] [address] [phone]  [facility]  [address] [place] [address] [phone]  [facility]  [address], [place] [address] [phone]  [facility]  [address] [place] [address] [phone]  [facility]  [address] [place] [address] [phone]  [facility]  [address] [place] [address] [phone]  [facility]  [address] [place] [address] [phone]  [facility]  [address], [place] [address] [phone]  [facility]  [address] [place] [address] [phone]  [facility]  [address], [place] [address] [phone]  [facility]  [address] [place] [address] [phone]  [facility]  [address], [place] [address] [phone]  [facility]  [address] [place] [address] [phone]  [facility] [address]  [address] [place] [address] [phone]  [facility]  [phone] [place] [address] [phone]  [facility]  [address], [place] [address] [phone]  [facility]  [address] [place] [address] [phone]  [facility]  [address], [place] [address] [phone]    Situation ongoing, CSW to continue following and update chart as more information becomes available.      Cathie Beams, LCSW  09/13/2022 10:11 AM

## 2022-09-13 NOTE — ED Provider Notes (Signed)
  Physical Exam  BP 115/83 (BP Location: Right Arm)   Pulse 61   Temp 97.8 F (36.6 C) (Oral)   Resp 18   Ht 5\' 9"  (1.753 m)   Wt 77.1 kg   SpO2 100%   BMI 25.10 kg/m   Physical Exam  Procedures  Procedures  ED Course / MDM    Medical Decision Making Amount and/or Complexity of Data Reviewed Labs: ordered. Radiology: ordered.  Risk OTC drugs. Prescription drug management.   Patient voluntary.  Has been here for 25 hours.  Suicidal and substance use.  Pending inpatient placement.    Voluntary at this time but per psychiatry note would IVC if attempted to leave.       Benjiman Core, MD 09/13/22 [phone]

## 2022-09-13 NOTE — Progress Notes (Signed)
[facility] Psych ED Progress Note  09/13/2022 10:19 AM John [name]  MRN:  [id number]   Subjective:   Patient seen at [facility] for face to face psychiatric reevaluation. Pt remains withdrawn, irritable, and depressed. He continues to endorse auditory hallucinations of voices telling him he is no good, to kill himself, and to kill others. He endorses suicidal ideations with a plan to "either overdose or jump off something." He endorses passive HI, no specific person, plan, or intent. He appears insightful, is understanding he needs to stop using illicit substances, and be compliant with medications. Pt is goal directed to inpatient treatment and getting stabilized on medications. Will continue to recommend inpatient psychiatric treatment at this time.   Principal Problem: MDD (major depressive disorder), recurrent, severe, with psychosis (HCC) Diagnosis:  Principal Problem:   MDD (major depressive disorder), recurrent, severe, with psychosis (HCC) Active Problems:   Cannabis use disorder, severe, dependence (HCC)   Cocaine use disorder, severe, dependence (HCC)   ED Assessment Time Calculation: Start Time: 0930 Stop Time: 1000 Total Time in Minutes (Assessment Completion): 30   [place] Scale:  Flowsheet Row ED from 09/12/2022 in [facility] Emergency Department at [facility] ED from 07/20/2022 in [facility] Emergency Department at [facility] ED from 06/23/2022 in [facility] Emergency Department at [facility]  C-SSRS RISK CATEGORY High Risk No Risk No Risk       Past Medical History:  Past Medical History:  Diagnosis Date   Acute hypoxemic respiratory failure (HCC) 05/07/2021   Adjustment disorder with depressed mood 10/28/2015   Alcoholism (HCC)    Asthma    Bipolar disorder (HCC)    with depression/anxiety   Cannabis use disorder, moderate, dependence (HCC) 04/02/2015   Chronic low back pain    Cocaine use disorder (HCC) 04/02/2015   Gout    HIV (human  immunodeficiency virus infection) (HCC)    "dx'd ~ 2 yr ago" (09/29/2012)   Homelessness    Hypertension    Polysubstance abuse (HCC) 04/11/2019    Past Surgical History:  Procedure Laterality Date   SKIN GRAFT FULL THICKNESS LEG Left ?   POSTERIOR LEFT LEG  AFTER DOG BITES   Family History:  Family History  Problem Relation Age of Onset   Alcoholism Mother    Depression Mother    Alcoholism Brother    Social History:  Social History   Substance and Sexual Activity  Alcohol Use Yes   Alcohol/week: 12.0 standard drinks of alcohol   Types: 12 Cans of beer per week     Social History   Substance and Sexual Activity  Drug Use Yes   Types: Marijuana, "Crack" cocaine    Social History   Socioeconomic History   Marital status: Single    Spouse name: Not on file   Number of children: 0   Years of education: Not on file   Highest education level: 9th grade  Occupational History   Occupation: disabled  Tobacco Use   Smoking status: Every Day    Packs/day: 0.50    Years: 27.00    Additional pack years: 0.00    Total pack years: 13.50    Types: Cigarettes   Smokeless tobacco: Never  Vaping Use   Vaping Use: Never used  Substance and Sexual Activity   Alcohol use: Yes    Alcohol/week: 12.0 standard drinks of alcohol    Types: 12 Cans of beer per week   Drug use: Yes    Types:  Marijuana, "Crack" cocaine   Sexual activity: Not Currently    Partners: Male    Birth control/protection: Condom    Comment: given condoms 01/2021  Other Topics Concern   Not on file  Social History Narrative   ** Merged History Encounter **       ** Merged History Encounter **       Social Determinants of Health   Financial Resource Strain: High Risk (05/22/2020)   Overall Financial Resource Strain (CARDIA)    Difficulty of Paying Living Expenses: Very hard  Food Insecurity: Food Insecurity Present (05/04/2022)   Hunger Vital Sign    Worried About Running Out of Food in the Last  Year: Often true    Ran Out of Food in the Last Year: Often true  Transportation Needs: Unmet Transportation Needs (05/04/2022)   PRAPARE - Transportation    Lack of Transportation (Medical): Yes    Lack of Transportation (Non-Medical): Yes  Physical Activity: Inactive (05/22/2020)   Exercise Vital Sign    Days of Exercise per Week: 0 days    Minutes of Exercise per Session: 0 min  Stress: Stress Concern Present (05/22/2020)   [employer] of Occupational Health - Occupational Stress Questionnaire    Feeling of Stress : Rather much  Social Connections: Moderately Isolated (05/22/2020)   Social Connection and Isolation Panel [NHANES]    Frequency of Communication with Friends and Family: Three times a week    Frequency of Social Gatherings with Friends and Family: Three times a week    Attends Religious Services: Never    Active Member of Clubs or Organizations: Yes    Attends Engineer, structural: More than 4 times per year    Marital Status: Never married    Sleep: Fair  Appetite:  Good  Current Medications: Current Facility-Administered Medications  Medication Dose Route Frequency Provider Last Rate Last Admin   albuterol (VENTOLIN HFA) 108 (90 Base) MCG/ACT inhaler 2 puff  2 puff Inhalation Q4H PRN Benjiman Core, MD   2 puff at 09/12/22 0758   bictegravir-emtricitabine-tenofovir AF (BIKTARVY) 50-200-25 MG per tablet 1 tablet  1 tablet Oral Daily Benjiman Core, MD   1 tablet at 09/12/22 1044   busPIRone (BUSPAR) tablet 5 mg  5 mg Oral BID Benjiman Core, MD   5 mg at 09/12/22 2223   gabapentin (NEURONTIN) capsule 200 mg  200 mg Oral TID Lenox Ponds, NP   200 mg at 09/12/22 2223   mirtazapine (REMERON) tablet 15 mg  15 mg Oral QHS Benjiman Core, MD   15 mg at 09/12/22 2223   mometasone-formoterol (DULERA) 100-5 MCG/ACT inhaler 2 puff  2 puff Inhalation BID Benjiman Core, MD   2 puff at 09/12/22 2024   predniSONE (DELTASONE) tablet 40 mg  40 mg  Oral Daily Benjiman Core, MD   40 mg at 09/12/22 8657   QUEtiapine (SEROQUEL XR) 24 hr tablet 300 mg  300 mg Oral QHS Benjiman Core, MD   300 mg at 09/12/22 2223   thiamine (VITAMIN B1) tablet 100 mg  100 mg Oral Daily Benjiman Core, MD   100 mg at 09/12/22 1043   Or   thiamine (VITAMIN B1) injection 100 mg  100 mg Intravenous Daily Benjiman Core, MD       Current Outpatient Medications  Medication Sig Dispense Refill   albuterol (VENTOLIN HFA) 108 (90 Base) MCG/ACT inhaler Inhale 2 puffs into the lungs every 4 (four) hours as needed for wheezing or shortness of  breath. 18 g 1   albuterol (VENTOLIN HFA) 108 (90 Base) MCG/ACT inhaler Inhale 1-2 puffs into the lungs every 6 (six) hours as needed for wheezing or shortness of breath. 8 g 0   bictegravir-emtricitabine-tenofovir AF (BIKTARVY) 50-200-25 MG TABS tablet Take 1 tablet by mouth daily. 30 tablet 0   budesonide-formoterol (SYMBICORT) 80-4.5 MCG/ACT inhaler Inhale 2 puffs into the lungs in the morning and at bedtime. 10.2 g 2   busPIRone (BUSPAR) 5 MG tablet Take 5 mg by mouth 2 (two) times daily.     gabapentin (NEURONTIN) 100 MG capsule Take 2 capsules (200 mg total) by mouth 3 (three) times daily. 180 capsule 0   mirtazapine (REMERON) 15 MG tablet Take 15 mg by mouth at bedtime.     nicotine (NICODERM CQ - DOSED IN MG/24 HOURS) 14 mg/24hr patch Place 1 patch (14 mg total) onto the skin daily. 28 patch 0   pantoprazole (PROTONIX) 40 MG tablet Take 1 tablet (40 mg total) by mouth daily. 30 tablet 0   QUEtiapine (SEROQUEL XR) 300 MG 24 hr tablet Take 1 tablet (300 mg total) by mouth at bedtime. 30 tablet 3   traZODone (DESYREL) 50 MG tablet Take 1 tablet (50 mg total) by mouth at bedtime as needed for sleep (sleep difficulty). 30 tablet 3    Lab Results:  Results for orders placed or performed during the hospital encounter of 09/12/22 (from the past 48 hour(s))  SARS Coronavirus 2 by RT PCR (hospital order, performed in [facility] hospital lab) *cepheid single result test* Anterior Nasal Swab     Status: None   Collection Time: 09/12/22  7:53 AM   Specimen: Anterior Nasal Swab  Result Value Ref Range   SARS Coronavirus 2 by RT PCR NEGATIVE NEGATIVE    Comment: Performed at [facility], [address] [place], [place] [address]  Comprehensive metabolic panel     Status: None   Collection Time: 09/12/22  7:58 AM  Result Value Ref Range   Sodium 139 135 - 145 mmol/L   Potassium 3.9 3.5 - 5.1 mmol/L   Chloride 105 98 - 111 mmol/L   CO2 23 22 - 32 mmol/L   Glucose, Bld 90 70 - 99 mg/dL    Comment: Glucose reference range applies only to samples taken after fasting for at least 8 hours.   BUN 15 6 - 20 mg/dL   Creatinine, Ser 2.95 0.61 - 1.24 mg/dL   Calcium 9.1 8.9 - 62.1 mg/dL   Total Protein 7.8 6.5 - 8.1 g/dL   Albumin 3.8 3.5 - 5.0 g/dL   AST 23 15 - 41 U/L   ALT 14 0 - 44 U/L   Alkaline Phosphatase 83 38 - 126 U/L   Total Bilirubin 0.8 0.3 - 1.2 mg/dL   GFR, Estimated >30 >86 mL/min    Comment: (NOTE) Calculated using the CKD-EPI Creatinine Equation (2021)    Anion gap 11 5 - 15    Comment: Performed at [facility], [address] [place], [place] [address]  Ethanol     Status: None   Collection Time: 09/12/22  7:58 AM  Result Value Ref Range   Alcohol, Ethyl (B) <10 <10 mg/dL    Comment: (NOTE) Lowest detectable limit for serum alcohol is 10 mg/dL.  For medical purposes only. Performed at [facility], [address] [place], [place] [address]   CBC     Status: None   Collection  Time: 09/12/22  7:58 AM  Result Value Ref Range   WBC 4.4 4.0 - 10.5 K/uL   RBC 5.10 4.22 - 5.81 MIL/uL   Hemoglobin 15.6 13.0 - 17.0 g/dL   HCT 16.1 09.6 - 04.5 %   MCV 93.3 80.0 - 100.0 fL   MCH 30.6 26.0 - 34.0 pg   MCHC 32.8 30.0 - 36.0 g/dL   RDW 40.9 81.1 - 91.4 %   Platelets 378 150 - 400 K/uL   nRBC 0.0 0.0 - 0.2 %    Comment: Performed at [facility], [address] [place], [place] [address]  Urine rapid drug screen (hosp performed)     Status: Abnormal   Collection Time: 09/12/22  9:40 AM  Result Value Ref Range   Opiates NONE DETECTED NONE DETECTED   Cocaine POSITIVE (A) NONE DETECTED   Benzodiazepines NONE DETECTED NONE DETECTED   Amphetamines NONE DETECTED NONE DETECTED   Tetrahydrocannabinol POSITIVE (A) NONE DETECTED   Barbiturates NONE DETECTED NONE DETECTED    Comment: (NOTE) DRUG SCREEN FOR MEDICAL PURPOSES ONLY.  IF CONFIRMATION IS NEEDED FOR ANY PURPOSE, NOTIFY LAB WITHIN 5 DAYS.  LOWEST DETECTABLE LIMITS FOR URINE DRUG SCREEN Drug Class                     Cutoff (ng/mL) Amphetamine and metabolites    1000 Barbiturate and metabolites    200 Benzodiazepine                 200 Opiates and metabolites        300 Cocaine and metabolites        300 THC                            50 Performed at [facility], [address], [place], [place] [address]     Blood Alcohol level:  Lab Results  Component Value Date   [facility] <10 09/12/2022   ETH <10 02/25/2022    Physical Findings:  CIWA:  CIWA-Ar Total: 3 COWS:      Psychiatric Specialty Exam:  Presentation  General Appearance:  Fairly Groomed  Eye Contact: Fair  Speech: Clear and Coherent  Speech Volume: Normal  Handedness: Right   Mood and Affect  Mood: Irritable; Depressed  Affect: Congruent   Thought Process  Thought Processes: Goal Directed  Descriptions of Associations:Intact  Orientation:Full (Time, Place and Person)  Thought Content:WDL  History of Schizophrenia/Schizoaffective disorder:Yes  Duration of Psychotic Symptoms:Greater than six months  Hallucinations:Hallucinations: Auditory Description of Auditory Hallucinations: to kill self and others  Ideas of Reference:None  Suicidal Thoughts:Suicidal Thoughts: Yes, Active SI Active Intent and/or Plan: With Intent; With Plan SI Passive Intent and/or Plan: With Intent; Without  Plan; Without Means to Carry Out; Without Access to Means  Homicidal Thoughts:Homicidal Thoughts: Yes, Passive HI Passive Intent and/or Plan: Without Intent; Without Plan   Sensorium  Memory: Immediate Fair; Recent Fair  Judgment: Fair  Insight: Fair   Chartered certified accountant: Fair  Attention Span: Fair  Recall: Good  Fund of Knowledge: Good  Language: Good   Psychomotor Activity  Psychomotor Activity: Psychomotor Activity: Normal   Assets  Assets: Desire for Improvement; Physical Health; Resilience; Social Support   Sleep  Sleep: Sleep: Fair    Physical Exam: Physical Exam Neurological:     Mental Status: He is alert and oriented to person, place, and time.  Psychiatric:        Attention and Perception: Attention normal.        Mood and Affect: Mood is anxious and depressed.        Speech: Speech normal.        Behavior: Behavior is withdrawn.    Review of Systems  Psychiatric/Behavioral:  Positive for depression, hallucinations, substance abuse and suicidal ideas.   All other systems reviewed and are negative.  Blood pressure 115/83, pulse 61, temperature 97.8 F (36.6 C), temperature source Oral, resp. rate 18, height 5\' 9"  (1.753 m), weight 77.1 kg, SpO2 100 %. Body mass index is 25.1 kg/m.   Medical Decision Making: Pt case reviewed and discussed with Dr. Lucianne Muss. Pt continues to meet criteria for inpatient psychiatric treatment. There is no current availability at [facility], CSW has been notified to fax out.   - no medication changes  Eligha Bridegroom, NP 09/13/2022, 10:19 AM

## 2022-09-14 NOTE — ED Provider Notes (Signed)
Patient accepted to [facility], transferred to Dr. Gracelyn Nurse, [place], DO 09/14/22 [phone]

## 2022-12-01 ENCOUNTER — Ambulatory Visit (HOSPITAL_COMMUNITY)
Admission: EM | Admit: 2022-12-01 | Discharge: 2022-12-02 | Disposition: A | Discharge status: Other Acute Inpatient Hospital | Payer: MEDICAID | Attending: Psychiatry | Admitting: Psychiatry

## 2022-12-01 ENCOUNTER — Other Ambulatory Visit: Payer: Self-pay

## 2022-12-01 DIAGNOSIS — I10 Essential (primary) hypertension: Secondary | ICD-10-CM | POA: Insufficient documentation

## 2022-12-01 DIAGNOSIS — Z87891 Personal history of nicotine dependence: Secondary | ICD-10-CM | POA: Insufficient documentation

## 2022-12-01 DIAGNOSIS — F250 Schizoaffective disorder, bipolar type: Secondary | ICD-10-CM

## 2022-12-01 DIAGNOSIS — F200 Paranoid schizophrenia: Secondary | ICD-10-CM | POA: Insufficient documentation

## 2022-12-01 DIAGNOSIS — R45850 Homicidal ideations: Secondary | ICD-10-CM | POA: Insufficient documentation

## 2022-12-01 DIAGNOSIS — Z91148 Patient's other noncompliance with medication regimen for other reason: Secondary | ICD-10-CM | POA: Insufficient documentation

## 2022-12-01 DIAGNOSIS — F1910 Other psychoactive substance abuse, uncomplicated: Secondary | ICD-10-CM | POA: Insufficient documentation

## 2022-12-01 DIAGNOSIS — F1990 Other psychoactive substance use, unspecified, uncomplicated: Secondary | ICD-10-CM

## 2022-12-01 DIAGNOSIS — R45851 Suicidal ideations: Secondary | ICD-10-CM | POA: Diagnosis not present

## 2022-12-01 DIAGNOSIS — F319 Bipolar disorder, unspecified: Secondary | ICD-10-CM | POA: Insufficient documentation

## 2022-12-01 DIAGNOSIS — F19959 Other psychoactive substance use, unspecified with psychoactive substance-induced psychotic disorder, unspecified: Secondary | ICD-10-CM

## 2022-12-01 DIAGNOSIS — Z5900 Homelessness unspecified: Secondary | ICD-10-CM | POA: Insufficient documentation

## 2022-12-01 LAB — COMPREHENSIVE METABOLIC PANEL WITH GFR
ALT: 24 U/L (ref 0–44)
AST: 24 U/L (ref 15–41)
Albumin: 3.5 g/dL (ref 3.5–5.0)
Alkaline Phosphatase: 69 U/L (ref 38–126)
Anion gap: 15 (ref 5–15)
BUN: 10 mg/dL (ref 6–20)
CO2: 27 mmol/L (ref 22–32)
Calcium: 8.8 mg/dL — ABNORMAL LOW (ref 8.9–10.3)
Chloride: 101 mmol/L (ref 98–111)
Creatinine, Ser: 0.85 mg/dL (ref 0.61–1.24)
GFR, Estimated: >60 mL/min (ref >60)
Glucose, Bld: 70 mg/dL (ref 70–99)
Potassium: 3 mmol/L — ABNORMAL LOW (ref 3.5–5.1)
Sodium: 143 mmol/L (ref 135–145)
Total Bilirubin: 0.4 mg/dL (ref 0.3–1.2)
Total Protein: 6.9 g/dL (ref 6.5–8.1)

## 2022-12-01 LAB — CBC WITH DIFFERENTIAL/PLATELET
Abs Immature Granulocytes: 0 10*3/uL (ref 0.00–0.07)
Basophils Absolute: 0 10*3/uL (ref 0.0–0.1)
Basophils Relative: 1 %
Eosinophils Absolute: 0.1 10*3/uL (ref 0.0–0.5)
Eosinophils Relative: 3 %
HCT: 41.1 % (ref 39.0–52.0)
Hemoglobin: 13.3 g/dL (ref 13.0–17.0)
Immature Granulocytes: 0 %
Lymphocytes Relative: 35 %
Lymphs Abs: 1.3 10*3/uL (ref 0.7–4.0)
MCH: 29.2 pg (ref 26.0–34.0)
MCHC: 32.4 g/dL (ref 30.0–36.0)
MCV: 90.3 fL (ref 80.0–100.0)
Monocytes Absolute: 0.5 10*3/uL (ref 0.1–1.0)
Monocytes Relative: 13 %
Neutro Abs: 1.8 10*3/uL (ref 1.7–7.7)
Neutrophils Relative %: 48 %
Platelets: 418 10*3/uL — ABNORMAL HIGH (ref 150–400)
RBC: 4.55 MIL/uL (ref 4.22–5.81)
RDW: 13.1 % (ref 11.5–15.5)
WBC: 3.7 10*3/uL — ABNORMAL LOW (ref 4.0–10.5)
nRBC: 0 % (ref 0.0–0.2)

## 2022-12-01 LAB — ETHANOL: Alcohol, Ethyl (B): <10 mg/dL (ref <10)

## 2022-12-01 LAB — LIPID PANEL
Cholesterol: 131 mg/dL (ref 0–200)
HDL: 44 mg/dL (ref >40)
LDL Cholesterol: 80 mg/dL (ref 0–99)
Total CHOL/HDL Ratio: 3 ratio
Triglycerides: 37 mg/dL (ref <150)
VLDL: 7 mg/dL (ref 0–40)

## 2022-12-01 LAB — POCT URINE DRUG SCREEN - MANUAL ENTRY (I-SCREEN)
POC Amphetamine UR: NOT DETECTED
POC Buprenorphine (BUP): NOT DETECTED
POC Cocaine UR: POSITIVE — AB
POC Marijuana UR: POSITIVE — AB
POC Methadone UR: NOT DETECTED
POC Methamphetamine UR: NOT DETECTED
POC Morphine: NOT DETECTED
POC Oxazepam (BZO): NOT DETECTED — AB
POC Oxycodone UR: NOT DETECTED
POC Secobarbital (BAR): NOT DETECTED

## 2022-12-01 MED ORDER — PANTOPRAZOLE SODIUM 40 MG PO TBEC: 40.0000 mg | DELAYED_RELEASE_TABLET | Freq: Every day | ORAL | Status: DC | PRN

## 2022-12-01 MED ORDER — TRAZODONE HCL 50 MG PO TABS: 50.0000 mg | ORAL_TABLET | Freq: Every evening | ORAL | Status: DC | PRN

## 2022-12-01 MED ORDER — BUSPIRONE HCL 5 MG PO TABS
5.0000 mg | ORAL_TABLET | Freq: Two times a day (BID) | ORAL | Status: DC
  Administered 2022-12-01 (×2): 5 mg via ORAL
  Filled 2022-12-01 (×2): qty 1

## 2022-12-01 MED ORDER — MIRTAZAPINE 15 MG PO TABS
15.0000 mg | ORAL_TABLET | Freq: Every day | ORAL | Status: DC
  Administered 2022-12-01: 15 mg via ORAL
  Filled 2022-12-01: qty 1

## 2022-12-01 MED ORDER — ACETAMINOPHEN 325 MG PO TABS: 650.0000 mg | ORAL_TABLET | Freq: Four times a day (QID) | ORAL | Status: DC | PRN

## 2022-12-01 MED ORDER — ALUM & MAG HYDROXIDE-SIMETH 200-200-20 MG/5ML PO SUSP: 30.0000 mL | ORAL | Status: DC | PRN

## 2022-12-01 MED ORDER — GABAPENTIN 100 MG PO CAPS
200.0000 mg | ORAL_CAPSULE | Freq: Three times a day (TID) | ORAL | Status: DC
  Administered 2022-12-01 (×2): 200 mg via ORAL
  Filled 2022-12-01 (×2): qty 2

## 2022-12-01 MED ORDER — MAGNESIUM HYDROXIDE 400 MG/5ML PO SUSP: 30.0000 mL | Freq: Every day | ORAL | Status: DC | PRN

## 2022-12-01 MED ORDER — BICTEGRAVIR-EMTRICITAB-TENOFOV 50-200-25 MG PO TABS
1.0000 | ORAL_TABLET | Freq: Every day | ORAL | Status: DC
  Administered 2022-12-01: 1 via ORAL
  Filled 2022-12-01: qty 1

## 2022-12-01 MED ORDER — ALBUTEROL SULFATE HFA 108 (90 BASE) MCG/ACT IN AERS: 1.0000 | INHALATION_SPRAY | Freq: Four times a day (QID) | RESPIRATORY_TRACT | Status: DC | PRN

## 2022-12-01 MED ORDER — QUETIAPINE FUMARATE ER 300 MG PO TB24
300.0000 mg | ORAL_TABLET | Freq: Every day | ORAL | Status: DC
  Administered 2022-12-01: 300 mg via ORAL
  Filled 2022-12-01: qty 1

## 2022-12-01 MED ORDER — HYDROXYZINE HCL 25 MG PO TABS: 25.0000 mg | ORAL_TABLET | Freq: Three times a day (TID) | ORAL | Status: DC | PRN

## 2022-12-01 NOTE — Progress Notes (Signed)
   12/01/22 1212  [facility] Triage Screening (Walk-ins at [facility] only)  How Did You Hear About [name]? Legal System  What Is the Reason for Your Visit/Call Today? John [name] is a 52 yr old male presenting to [facility] escorted by [employer]. Pt is voluntarily and has consistent thoughts of SI. Pt reports that he has been having suicidal thoughts for 2.5 days and has a plan to do anything to end his life if he were to leave today. Pt is also saying that he would "hurt or kill anyone". Pt reports to be homeless. Pt reports to have a hx of both drugs and alcohol. Pt mentions that he has 27 bags of weed and smoked last night. Pt denies alcohol usage at this time. Pt does take his prescribed medication currently. Pt endorses AVH and states, "I hear voices that tell me I am not worth living and I also see spots and purple men".  How Long Has This Been Causing You Problems? <Week  Have You Recently Had Any Thoughts About Hurting Yourself? No  Are You Planning to Commit Suicide/Harm Yourself At This time? Yes  Have you Recently Had Thoughts About Hurting Someone Karolee Ohs? Yes  How long ago did you have thoughts of harming others? today  Are You Planning To Harm Someone At This Time? Yes  Explanation: "I would hurt anyone"  Are you currently experiencing any auditory, visual or other hallucinations? Yes  Please explain the hallucinations you are currently experiencing: purple men, not worth living  Have You Used Any Alcohol or Drugs in the Past 24 Hours? Yes  How long ago did you use Drugs or Alcohol? last night  What Did You Use and How Much? 27 bags of weed  Do you have any current medical co-morbidities that require immediate attention? No  Clinician description of patient physical appearance/behavior: agitated, short worded  What Do You Feel Would Help You the Most Today? Alcohol or Drug Use Treatment;Medication(s);Housing Assistance  If access to [facility] was not available, would you have sought care in the Emergency  Department? No  Determination of Need Urgent (48 hours)  Options For Referral Medication Management;Inpatient Hospitalization;Intensive Outpatient Therapy
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
## 2022-12-01 NOTE — ED Notes (Signed)
Pt sleeping@this  breathing even and unlabored will continue to monitor for saftey

## 2022-12-01 NOTE — ED Notes (Addendum)
The patient was admitted tom obs bed 5 A&O x 4. Disheveled appearance. Patient reports having SI w/o plan. Patient states he does not know how he would complete the mission. Denies HI, AVH. Appears depressed, hopeless, and labile.  Offered nourishment and consumed then laid down to rest.

## 2022-12-01 NOTE — ED Notes (Signed)
Pt. Resting in bed at the current c eyes closed. No s/s of acute distress, pain or any discomfort at this time. VSS. Safety maintained. Will continue to monitor and report any COC.

## 2022-12-01 NOTE — ED Notes (Signed)
Pt was provided lunch

## 2022-12-01 NOTE — ED Notes (Signed)
Pt sleeping@this  time breathing even and unlabored will continue to monitor for safety

## 2022-12-01 NOTE — ED Provider Notes (Signed)
[facility] Continuous Assessment Admission H&P  Date: 12/01/22 Patient Name: Samrudh Borgo MRN: [id number] Chief Complaint: suicidal ideation and homicidal ideation  Diagnoses:  Final diagnoses:  Paranoid schizophrenia (HCC)  Polysubstance use disorder  Psychoactive substance-induced psychosis (HCC)    HPI: Zephyrus Marcinek is a 52 y/o male presenting voluntarily with a history of schizophrenia, polysubstance use disorder, substance-induced mood disorder and bipolar disorder to [facility] via [facility]. Patient presented initially to [facility] seeking mental treatment. Patient reported to the social worker that he is experiencing suicidal ideations with a plan to jump off a bridge and vague homicidal ideations.  Patient reported that he was having auditory and visual hallucinations.  Nurse practitioner assessed patient face-to-face and reviewed his chart.  Patient is alert oriented x 4, speech is clear and coherent, thought processes logical and goal-directed, mood is depressed and irritable with congruent affect. Patient reports that he is homeless and has been sleeping under a bridge. Patient states that he stopped his medication because he did not have transportation to get his medications. Patient reports that smokes about 10.00 of marijuana daily and denied any other illicit substance use. Patient's UDS however is positive for cocaine. Patient endorsed SI/HI but stated that he did not have a specific plan or any particular patient he had homicidal ideation towards for the past two days.  Patient stated that he wanted to get back on his medications. NP discussed with patient that he could come to the [facility] outpatient clinic during walk in hours to get established and restart medications. Patient angry and stated, If I am discharged what ever happens, happens. When asked what he meant by that statement patient that shrugged his shoulders and stated that he needed his medications. Patient stated that voices  are getting stronger and stronger and have been telling him to hurt himself. Patient reports that he few months ago that he hurt himself with scissors.   Patient is not able to contract for safety at this time. Patient will be admitted to [facility] continuous observation for crisis management, safety and stabilization. Will restart home medications for patient with the plan of discharge if not suicidal and not homicidal.     Total Time spent with patient: 20 minutes  Musculoskeletal  Strength & Muscle Tone: within normal limits Gait & Station: normal Patient leans: N/A  Psychiatric Specialty Exam  Presentation General Appearance:  Disheveled  Eye Contact: Fair  Speech: Clear and Coherent  Speech Volume: Normal  Handedness: Right   Mood and Affect  Mood: Angry; Depressed; Irritable  Affect: Labile   Thought Process  Thought Processes: Coherent  Descriptions of Associations:Intact  Orientation:Full (Time, Place and Person)  Thought Content:Tangential  Diagnosis of Schizophrenia or Schizoaffective disorder in past: Yes  Duration of Psychotic Symptoms: Greater than six months  Hallucinations:Hallucinations: Auditory Description of Auditory Hallucinations: Telling him to hurt people  Ideas of Reference:None  Suicidal Thoughts:Suicidal Thoughts: Yes, Passive SI Passive Intent and/or Plan: Without Intent; Without Plan  Homicidal Thoughts:Homicidal Thoughts: Yes, Passive HI Passive Intent and/or Plan: Without Intent; Without Plan   Sensorium  Memory: Immediate Fair; Recent Fair; Remote Fair  Judgment: Poor  Insight: Poor   Executive Functions  Concentration: Fair  Attention Span: Fair  Recall: [employer] of Knowledge: Fair  Language: Fair   Psychomotor Activity  Psychomotor Activity: Psychomotor Activity: Normal   Assets  Assets: Desire for Improvement; Physical Health; Housing   Sleep  Sleep: Sleep: Poor Number of Hours of  Sleep: 2   Nutritional Assessment (For OBS and FBC admissions only) Has the patient had a weight loss or gain of 10 pounds or more in the last 3 months?: No Has the patient had a decrease in food intake/or appetite?: No Does the patient have dental problems?: No Does the patient have eating habits or behaviors that may be indicators of an eating disorder including binging or inducing vomiting?: No Has the patient recently lost weight without trying?: 0 Has the patient been eating poorly because of a decreased appetite?: 1 Malnutrition Screening Tool Score: 1    Physical Exam Vitals reviewed.  HENT:     Head: Normocephalic.     Nose: Nose normal.  Eyes:     Pupils: Pupils are equal, round, and reactive to light.  Cardiovascular:     Rate and Rhythm: Normal rate.  Pulmonary:     Effort: Pulmonary effort is normal.  Abdominal:     General: Abdomen is flat.  Musculoskeletal:        General: Normal range of motion.     Cervical back: Normal range of motion.  Skin:    General: Skin is warm.  Neurological:     Mental Status: He is alert and oriented to person, place, and time.  Psychiatric:        Attention and Perception: Attention normal.        Mood and Affect: Mood normal.        Speech: Speech normal.        Behavior: Behavior is cooperative.        Thought Content: Thought content normal.        Cognition and Memory: Cognition normal.        Judgment: Judgment is impulsive.    Review of Systems  Constitutional: Negative.   HENT: Negative.    Eyes: Negative.   Respiratory: Negative.    Cardiovascular: Negative.   Gastrointestinal: Negative.   Genitourinary: Negative.   Musculoskeletal: Negative.   Skin: Negative.   Psychiatric/Behavioral:  Positive for hallucinations, substance abuse and suicidal ideas.     Blood pressure (!) 141/95, pulse 89, temperature 98.6 F (37 C), temperature source Oral, resp. rate 19, SpO2 100%. There is no height or weight on file to  calculate BMI.  Past Psychiatric History: Polysubstance use disorder, substance-induced mood disorder, psychoactive substance induced psychosis schizophrenia  Is the patient at risk to self? Yes  Has the patient been a risk to self in the past 6 months? No .    Has the patient been a risk to self within the distant past? Yes   Is the patient a risk to others? Yes   Has the patient been a risk to others in the past 6 months? No   Has the patient been a risk to others within the distant past? Yes   Past Medical History:  Past Medical History:  Diagnosis Date   Acute hypoxemic respiratory failure (HCC) 05/07/2021   Adjustment disorder with depressed mood 10/28/2015   Alcoholism (HCC)    Asthma    Bipolar disorder (HCC)    with depression/anxiety   Cannabis use disorder, moderate, dependence (HCC) 04/02/2015   Chronic low back pain    Cocaine use disorder (HCC) 04/02/2015   Gout    HIV (human immunodeficiency virus infection) (HCC)    "dx'd ~ 2 yr ago" (09/29/2012)   Homelessness    Hypertension    Polysubstance abuse (HCC) 04/11/2019     Family History:Denies  Social  History: 52 year old male, single with a psychiatric history of schizophrenia unhoused and has been living under a bridge, currently receives disability  Last Labs:  Admission on 12/01/2022  Component Date Value Ref Range Status   WBC 12/01/2022 3.7 (L)  4.0 - 10.5 K/uL Final   RBC 12/01/2022 4.55  4.22 - 5.81 MIL/uL Final   Hemoglobin 12/01/2022 13.3  13.0 - 17.0 g/dL Final   HCT 16/12/9602 41.1  39.0 - 52.0 % Final   MCV 12/01/2022 90.3  80.0 - 100.0 fL Final   MCH 12/01/2022 29.2  26.0 - 34.0 pg Final   MCHC 12/01/2022 32.4  30.0 - 36.0 g/dL Final   RDW 54/11/8117 13.1  11.5 - 15.5 % Final   Platelets 12/01/2022 418 (H)  150 - 400 K/uL Final   nRBC 12/01/2022 0.0  0.0 - 0.2 % Final   Neutrophils Relative % 12/01/2022 48  % Final   Neutro Abs 12/01/2022 1.8  1.7 - 7.7 K/uL Final   Lymphocytes Relative  12/01/2022 35  % Final   Lymphs Abs 12/01/2022 1.3  0.7 - 4.0 K/uL Final   Monocytes Relative 12/01/2022 13  % Final   Monocytes Absolute 12/01/2022 0.5  0.1 - 1.0 K/uL Final   Eosinophils Relative 12/01/2022 3  % Final   Eosinophils Absolute 12/01/2022 0.1  0.0 - 0.5 K/uL Final   Basophils Relative 12/01/2022 1  % Final   Basophils Absolute 12/01/2022 0.0  0.0 - 0.1 K/uL Final   Immature Granulocytes 12/01/2022 0  % Final   Abs Immature Granulocytes 12/01/2022 0.00  0.00 - 0.07 K/uL Final   Performed at [facility] Lab, [address] [place], [place] [address]   Sodium 12/01/2022 143  135 - 145 mmol/L Final   Potassium 12/01/2022 3.0 (L)  3.5 - 5.1 mmol/L Final   Chloride 12/01/2022 101  98 - 111 mmol/L Final   CO2 12/01/2022 27  22 - 32 mmol/L Final   Glucose, Bld 12/01/2022 70  70 - 99 mg/dL Final   Glucose reference range applies only to samples taken after fasting for at least 8 hours.   BUN 12/01/2022 10  6 - 20 mg/dL Final   Creatinine, Ser 12/01/2022 0.85  0.61 - 1.24 mg/dL Final   Calcium 95/62/1308 8.8 (L)  8.9 - 10.3 mg/dL Final   Total Protein 65/78/4696 6.9  6.5 - 8.1 g/dL Final   Albumin 29/52/8413 3.5  3.5 - 5.0 g/dL Final   AST 24/40/1027 24  15 - 41 U/L Final   ALT 12/01/2022 24  0 - 44 U/L Final   Alkaline Phosphatase 12/01/2022 69  38 - 126 U/L Final   Total Bilirubin 12/01/2022 0.4  0.3 - 1.2 mg/dL Final   GFR, Estimated 12/01/2022 >60  >60 mL/min Final   Comment: (NOTE) Calculated using the CKD-EPI Creatinine Equation (2021)    Anion gap 12/01/2022 15  5 - 15 Final   Performed at [facility] Lab, [address], [place], [place] [address]   Alcohol, Ethyl (B) 12/01/2022 <10  <10 mg/dL Final   Comment: (NOTE) Lowest detectable limit for serum alcohol is 10 mg/dL.  For medical purposes only. Performed at [facility] Lab, [address] [place], [place] [address]    POC Amphetamine UR 12/01/2022 None Detected  NONE DETECTED (Cut Off Level 1000  ng/mL) Final   POC Secobarbital (BAR) 12/01/2022 None Detected  NONE DETECTED (Cut Off Level 300 ng/mL) Final   POC Buprenorphine (BUP)  12/01/2022 None Detected  NONE DETECTED (Cut Off Level 10 ng/mL) Final   POC Oxazepam (BZO) 12/01/2022 None Detected (A)  NONE DETECTED (Cut Off Level 300 ng/mL) Final   POC Cocaine UR 12/01/2022 Positive (A)  NONE DETECTED (Cut Off Level 300 ng/mL) Final   POC Methamphetamine UR 12/01/2022 None Detected  NONE DETECTED (Cut Off Level 1000 ng/mL) Final   POC Morphine 12/01/2022 None Detected  NONE DETECTED (Cut Off Level 300 ng/mL) Final   POC Methadone UR 12/01/2022 None Detected  NONE DETECTED (Cut Off Level 300 ng/mL) Final   POC Oxycodone UR 12/01/2022 None Detected  NONE DETECTED (Cut Off Level 100 ng/mL) Final   POC Marijuana UR 12/01/2022 Positive (A)  NONE DETECTED (Cut Off Level 50 ng/mL) Final   Cholesterol 12/01/2022 131  0 - 200 mg/dL Final   Triglycerides 84/13/2440 37  <150 mg/dL Final   HDL 01/09/2535 44  >40 mg/dL Final   Total CHOL/HDL Ratio 12/01/2022 3.0  RATIO Final   VLDL 12/01/2022 7  0 - 40 mg/dL Final   LDL Cholesterol 12/01/2022 80  0 - 99 mg/dL Final   Comment:        Total Cholesterol/HDL:CHD Risk Coronary Heart Disease Risk Table                     Men   Women  1/2 Average Risk   3.4   3.3  Average Risk       5.0   4.4  2 X Average Risk   9.6   7.1  3 X Average Risk  23.4   11.0        Use the calculated Patient Ratio above and the CHD Risk Table to determine the patient's CHD Risk.        ATP III CLASSIFICATION (LDL):  <100     mg/dL   Optimal  644-034  mg/dL   Near or Above                    Optimal  130-159  mg/dL   Borderline  742-595  mg/dL   High  >638     mg/dL   Very High Performed at [facility] Lab, [address] [place], [place] [address]   Admission on 09/12/2022, Discharged on 09/14/2022  Component Date Value Ref Range Status   Opiates 09/12/2022 NONE DETECTED  NONE DETECTED Final   Cocaine  09/12/2022 POSITIVE (A)  NONE DETECTED Final   Benzodiazepines 09/12/2022 NONE DETECTED  NONE DETECTED Final   Amphetamines 09/12/2022 NONE DETECTED  NONE DETECTED Final   Tetrahydrocannabinol 09/12/2022 POSITIVE (A)  NONE DETECTED Final   Barbiturates 09/12/2022 NONE DETECTED  NONE DETECTED Final   Comment: (NOTE) DRUG SCREEN FOR MEDICAL PURPOSES ONLY.  IF CONFIRMATION IS NEEDED FOR ANY PURPOSE, NOTIFY LAB WITHIN 5 DAYS.  LOWEST DETECTABLE LIMITS FOR URINE DRUG SCREEN Drug Class                     Cutoff (ng/mL) Amphetamine and metabolites    1000 Barbiturate and metabolites    200 Benzodiazepine                 200 Opiates and metabolites        300 Cocaine and metabolites        300 THC  50 Performed at [facility] Lab, [address], [place], [place] [address]    SARS Coronavirus 2 by RT PCR 09/12/2022 NEGATIVE  NEGATIVE Final   Performed at [facility] Lab, [address], [place], [place] [address]   Sodium 09/12/2022 139  135 - 145 mmol/L Final   Potassium 09/12/2022 3.9  3.5 - 5.1 mmol/L Final   Chloride 09/12/2022 105  98 - 111 mmol/L Final   CO2 09/12/2022 23  22 - 32 mmol/L Final   Glucose, Bld 09/12/2022 90  70 - 99 mg/dL Final   Glucose reference range applies only to samples taken after fasting for at least 8 hours.   BUN 09/12/2022 15  6 - 20 mg/dL Final   Creatinine, Ser 09/12/2022 0.93  0.61 - 1.24 mg/dL Final   Calcium 09/81/1914 9.1  8.9 - 10.3 mg/dL Final   Total Protein 78/29/5621 7.8  6.5 - 8.1 g/dL Final   Albumin 30/86/5784 3.8  3.5 - 5.0 g/dL Final   AST 69/62/9528 23  15 - 41 U/L Final   ALT 09/12/2022 14  0 - 44 U/L Final   Alkaline Phosphatase 09/12/2022 83  38 - 126 U/L Final   Total Bilirubin 09/12/2022 0.8  0.3 - 1.2 mg/dL Final   GFR, Estimated 09/12/2022 >60  >60 mL/min Final   Comment: (NOTE) Calculated using the CKD-EPI Creatinine Equation (2021)    Anion gap 09/12/2022 11  5 - 15 Final   Performed at  [facility] Lab, [address], [place], [place] [address]   Alcohol, Ethyl (B) 09/12/2022 <10  <10 mg/dL Final   Comment: (NOTE) Lowest detectable limit for serum alcohol is 10 mg/dL.  For medical purposes only. Performed at [facility] Lab, [address], [place], [place] [address]    WBC 09/12/2022 4.4  4.0 - 10.5 K/uL Final   RBC 09/12/2022 5.10  4.22 - 5.81 MIL/uL Final   Hemoglobin 09/12/2022 15.6  13.0 - 17.0 g/dL Final   HCT 72/53/6644 47.6  39.0 - 52.0 % Final   MCV 09/12/2022 93.3  80.0 - 100.0 fL Final   MCH 09/12/2022 30.6  26.0 - 34.0 pg Final   MCHC 09/12/2022 32.8  30.0 - 36.0 g/dL Final   RDW 03/47/4259 12.5  11.5 - 15.5 % Final   Platelets 09/12/2022 378  150 - 400 K/uL Final   nRBC 09/12/2022 0.0  0.0 - 0.2 % Final   Performed at [facility] Lab, [address], [place], [place] [address]  Admission on 07/20/2022, Discharged on 07/20/2022  Component Date Value Ref Range Status   Sodium 07/20/2022 138  135 - 145 mmol/L Final   Potassium 07/20/2022 3.9  3.5 - 5.1 mmol/L Final   Chloride 07/20/2022 106  98 - 111 mmol/L Final   CO2 07/20/2022 21 (L)  22 - 32 mmol/L Final   Glucose, Bld 07/20/2022 198 (H)  70 - 99 mg/dL Final   Glucose reference range applies only to samples taken after fasting for at least 8 hours.   BUN 07/20/2022 10  6 - 20 mg/dL Final   Creatinine, Ser 07/20/2022 0.82  0.61 - 1.24 mg/dL Final   Calcium 56/43/3295 8.7 (L)  8.9 - 10.3 mg/dL Final   GFR, Estimated 07/20/2022 >60  >60 mL/min Final   Comment: (NOTE) Calculated using the CKD-EPI Creatinine Equation (2021)    Anion gap 07/20/2022 11  5 - 15 Final   Performed at [facility]  Lab, [address] [place], [place] [address]   WBC 07/20/2022 5.5  4.0 - 10.5 K/uL Final   RBC 07/20/2022 4.73  4.22 - 5.81 MIL/uL Final   Hemoglobin 07/20/2022 14.3  13.0 - 17.0 g/dL Final   HCT 19/14/7829 43.9  39.0 - 52.0 % Final   MCV 07/20/2022 92.8  80.0 - 100.0 fL Final   MCH 07/20/2022  30.2  26.0 - 34.0 pg Final   MCHC 07/20/2022 32.6  30.0 - 36.0 g/dL Final   RDW 56/21/3086 13.0  11.5 - 15.5 % Final   Platelets 07/20/2022 341  150 - 400 K/uL Final   nRBC 07/20/2022 0.0  0.0 - 0.2 % Final   Neutrophils Relative % 07/20/2022 35  % Final   Neutro Abs 07/20/2022 1.9  1.7 - 7.7 K/uL Final   Lymphocytes Relative 07/20/2022 45  % Final   Lymphs Abs 07/20/2022 2.6  0.7 - 4.0 K/uL Final   Monocytes Relative 07/20/2022 14  % Final   Monocytes Absolute 07/20/2022 0.8  0.1 - 1.0 K/uL Final   Eosinophils Relative 07/20/2022 5  % Final   Eosinophils Absolute 07/20/2022 0.3  0.0 - 0.5 K/uL Final   Basophils Relative 07/20/2022 1  % Final   Basophils Absolute 07/20/2022 0.0  0.0 - 0.1 K/uL Final   Immature Granulocytes 07/20/2022 0  % Final   Abs Immature Granulocytes 07/20/2022 0.01  0.00 - 0.07 K/uL Final   Performed at [facility] Lab, [address], [place], [place] [address]    Allergies: Shellfish allergy  Medications:  Facility Ordered Medications  Medication   acetaminophen (TYLENOL) tablet 650 mg   alum & mag hydroxide-simeth (MAALOX/MYLANTA) 200-200-20 MG/5ML suspension 30 mL   magnesium hydroxide (MILK OF MAGNESIA) suspension 30 mL   traZODone (DESYREL) tablet 50 mg   hydrOXYzine (ATARAX) tablet 25 mg   albuterol (VENTOLIN HFA) 108 (90 Base) MCG/ACT inhaler 1-2 puff   busPIRone (BUSPAR) tablet 5 mg   gabapentin (NEURONTIN) capsule 200 mg   mirtazapine (REMERON) tablet 15 mg   bictegravir-emtricitabine-tenofovir AF (BIKTARVY) 50-200-25 MG per tablet 1 tablet   QUEtiapine (SEROQUEL XR) 24 hr tablet 300 mg   pantoprazole (PROTONIX) EC tablet 40 mg   PTA Medications  Medication Sig   pantoprazole (PROTONIX) 40 MG tablet Take 1 tablet (40 mg total) by mouth daily. (Patient taking differently: Take 40 mg by mouth daily as needed (For heartburn or acid reflux).)   gabapentin (NEURONTIN) 100 MG capsule Take 2 capsules (200 mg total) by mouth 3 (three) times daily.    QUEtiapine (SEROQUEL XR) 300 MG 24 hr tablet Take 1 tablet (300 mg total) by mouth at bedtime.   traZODone (DESYREL) 50 MG tablet Take 1 tablet (50 mg total) by mouth at bedtime as needed for sleep (sleep difficulty).   bictegravir-emtricitabine-tenofovir AF (BIKTARVY) 50-200-25 MG TABS tablet Take 1 tablet by mouth daily.   albuterol (VENTOLIN HFA) 108 (90 Base) MCG/ACT inhaler Inhale 1-2 puffs into the lungs every 6 (six) hours as needed for wheezing or shortness of breath.   busPIRone (BUSPAR) 5 MG tablet Take 5 mg by mouth 2 (two) times daily.   mirtazapine (REMERON) 15 MG tablet Take 15 mg by mouth at bedtime.      Medical Decision Making  effrey Blayney is a 52 y/o male presenting voluntarily with a history of schizophrenia and bipolar disorder to [facility] via [facility]. Patient presented initially to [facility] seeking mental treatment. Patient reported to the social  worker that he is experiencing suicidal ideations with a plan to jump off a bridge and vague homicidal ideations.  Patient reported that he was having auditory and visual hallucinations.    Recommendations  Based on my evaluation the patient does not appear to have an emergency medical condition. Patient will be admitted to [facility] continuous observation for crisis management, safety and stabilization.Jasper Riling, NP 12/01/22  5:00 PM

## 2022-12-02 ENCOUNTER — Observation Stay (HOSPITAL_COMMUNITY)
Admission: EM | Admit: 2022-12-02 | Discharge: 2022-12-03 | Disposition: A | Discharge status: Home / Self Care | Payer: MEDICAID | Attending: Internal Medicine | Admitting: Internal Medicine

## 2022-12-02 ENCOUNTER — Emergency Department (HOSPITAL_COMMUNITY): Payer: MEDICAID

## 2022-12-02 ENCOUNTER — Encounter (HOSPITAL_COMMUNITY): Payer: Self-pay

## 2022-12-02 ENCOUNTER — Other Ambulatory Visit: Payer: Self-pay

## 2022-12-02 DIAGNOSIS — Z21 Asymptomatic human immunodeficiency virus [HIV] infection status: Secondary | ICD-10-CM | POA: Insufficient documentation

## 2022-12-02 DIAGNOSIS — J45909 Unspecified asthma, uncomplicated: Secondary | ICD-10-CM | POA: Diagnosis not present

## 2022-12-02 DIAGNOSIS — K56600 Partial intestinal obstruction, unspecified as to cause: Principal | ICD-10-CM

## 2022-12-02 DIAGNOSIS — K567 Ileus, unspecified: Secondary | ICD-10-CM

## 2022-12-02 DIAGNOSIS — E876 Hypokalemia: Secondary | ICD-10-CM | POA: Diagnosis not present

## 2022-12-02 DIAGNOSIS — F250 Schizoaffective disorder, bipolar type: Secondary | ICD-10-CM

## 2022-12-02 DIAGNOSIS — F3160 Bipolar disorder, current episode mixed, unspecified: Secondary | ICD-10-CM | POA: Diagnosis present

## 2022-12-02 DIAGNOSIS — R10817 Generalized abdominal tenderness: Secondary | ICD-10-CM | POA: Diagnosis present

## 2022-12-02 DIAGNOSIS — Z1152 Encounter for screening for COVID-19: Secondary | ICD-10-CM | POA: Insufficient documentation

## 2022-12-02 DIAGNOSIS — R1084 Generalized abdominal pain: Principal | ICD-10-CM

## 2022-12-02 LAB — COMPREHENSIVE METABOLIC PANEL
ALT: 21 U/L (ref 0–44)
AST: 24 U/L (ref 15–41)
Albumin: 3.2 g/dL — ABNORMAL LOW (ref 3.5–5.0)
Alkaline Phosphatase: 64 U/L (ref 38–126)
Anion gap: 9 (ref 5–15)
BUN: 12 mg/dL (ref 6–20)
CO2: 22 mmol/L (ref 22–32)
Calcium: 8.5 mg/dL — ABNORMAL LOW (ref 8.9–10.3)
Chloride: 108 mmol/L (ref 98–111)
Creatinine, Ser: 0.93 mg/dL (ref 0.61–1.24)
GFR, Estimated: >60 mL/min (ref >60)
Glucose, Bld: 100 mg/dL — ABNORMAL HIGH (ref 70–99)
Potassium: 3.5 mmol/L (ref 3.5–5.1)
Sodium: 139 mmol/L (ref 135–145)
Total Bilirubin: 0.7 mg/dL (ref 0.3–1.2)
Total Protein: 6.4 g/dL — ABNORMAL LOW (ref 6.5–8.1)

## 2022-12-02 LAB — CBC WITH DIFFERENTIAL/PLATELET
Abs Immature Granulocytes: 0.02 10*3/uL (ref 0.00–0.07)
Basophils Absolute: 0 10*3/uL (ref 0.0–0.1)
Basophils Relative: 0 %
Eosinophils Absolute: 0.1 10*3/uL (ref 0.0–0.5)
Eosinophils Relative: 1 %
HCT: 38.4 % — ABNORMAL LOW (ref 39.0–52.0)
Hemoglobin: 13 g/dL (ref 13.0–17.0)
Immature Granulocytes: 0 %
Lymphocytes Relative: 21 %
Lymphs Abs: 1.7 10*3/uL (ref 0.7–4.0)
MCH: 30.9 pg (ref 26.0–34.0)
MCHC: 33.9 g/dL (ref 30.0–36.0)
MCV: 91.2 fL (ref 80.0–100.0)
Monocytes Absolute: 0.7 10*3/uL (ref 0.1–1.0)
Monocytes Relative: 9 %
Neutro Abs: 5.6 10*3/uL (ref 1.7–7.7)
Neutrophils Relative %: 69 %
Platelets: 383 10*3/uL (ref 150–400)
RBC: 4.21 MIL/uL — ABNORMAL LOW (ref 4.22–5.81)
RDW: 13.3 % (ref 11.5–15.5)
WBC: 8.1 10*3/uL (ref 4.0–10.5)
nRBC: 0 % (ref 0.0–0.2)

## 2022-12-02 LAB — LIPASE, BLOOD: Lipase: 24 U/L (ref 11–51)

## 2022-12-02 LAB — SARS CORONAVIRUS 2 BY RT PCR: SARS Coronavirus 2 by RT PCR: NEGATIVE

## 2022-12-02 MED ORDER — BUSPIRONE HCL 5 MG PO TABS
5.0000 mg | ORAL_TABLET | Freq: Two times a day (BID) | ORAL | Status: DC
  Administered 2022-12-02 – 2022-12-03 (×2): 5 mg via ORAL
  Filled 2022-12-02 (×3): qty 1

## 2022-12-02 MED ORDER — GABAPENTIN 100 MG PO CAPS: 200.0000 mg | ORAL_CAPSULE | Freq: Two times a day (BID) | ORAL | 0 refills | Status: DC

## 2022-12-02 MED ORDER — ONDANSETRON HCL 4 MG/2ML IJ SOLN
4.0000 mg | Freq: Once | INTRAMUSCULAR | Status: AC
  Administered 2022-12-02: 4 mg via INTRAVENOUS
  Filled 2022-12-02: qty 2

## 2022-12-02 MED ORDER — QUETIAPINE FUMARATE ER 300 MG PO TB24
300.0000 mg | ORAL_TABLET | Freq: Every day | ORAL | Status: DC
  Filled 2022-12-02: qty 1

## 2022-12-02 MED ORDER — SODIUM CHLORIDE 0.9 % IV BOLUS
1000.0000 mL | Freq: Once | INTRAVENOUS | Status: AC
  Administered 2022-12-02: 1000 mL via INTRAVENOUS

## 2022-12-02 MED ORDER — MIRTAZAPINE 15 MG PO TABS: 15.0000 mg | ORAL_TABLET | Freq: Every day | ORAL | 0 refills | Status: DC

## 2022-12-02 MED ORDER — FOLIC ACID 1 MG PO TABS
1.0000 mg | ORAL_TABLET | Freq: Every day | ORAL | Status: DC
  Administered 2022-12-02 – 2022-12-03 (×2): 1 mg via ORAL
  Filled 2022-12-02 (×2): qty 1

## 2022-12-02 MED ORDER — BUSPIRONE HCL 5 MG PO TABS: 5.0000 mg | ORAL_TABLET | Freq: Two times a day (BID) | ORAL | 0 refills | Status: DC

## 2022-12-02 MED ORDER — FENTANYL CITRATE PF 50 MCG/ML IJ SOSY
50.0000 ug | PREFILLED_SYRINGE | Freq: Once | INTRAMUSCULAR | Status: AC
  Administered 2022-12-02: 50 ug via INTRAVENOUS
  Filled 2022-12-02: qty 1

## 2022-12-02 MED ORDER — IOHEXOL 350 MG/ML SOLN
75.0000 mL | Freq: Once | INTRAVENOUS | Status: AC | PRN
  Administered 2022-12-02: 75 mL via INTRAVENOUS

## 2022-12-02 MED ORDER — THIAMINE MONONITRATE 100 MG PO TABS
100.0000 mg | ORAL_TABLET | Freq: Every day | ORAL | Status: DC
  Administered 2022-12-02 – 2022-12-03 (×2): 100 mg via ORAL
  Filled 2022-12-02 (×2): qty 1

## 2022-12-02 MED ORDER — THIAMINE HCL 100 MG/ML IJ SOLN: 100.0000 mg | Freq: Every day | INTRAMUSCULAR | Status: DC

## 2022-12-02 MED ORDER — ALBUTEROL SULFATE (2.5 MG/3ML) 0.083% IN NEBU: 3.0000 mL | INHALATION_SOLUTION | Freq: Four times a day (QID) | RESPIRATORY_TRACT | Status: DC | PRN

## 2022-12-02 MED ORDER — MIRTAZAPINE 15 MG PO TABS
15.0000 mg | ORAL_TABLET | Freq: Every day | ORAL | Status: DC
  Administered 2022-12-02: 15 mg via ORAL
  Filled 2022-12-02: qty 1

## 2022-12-02 MED ORDER — BIKTARVY 50-200-25 MG PO TABS: 1.0000 | ORAL_TABLET | Freq: Every day | ORAL | 0 refills | Status: DC

## 2022-12-02 MED ORDER — QUETIAPINE FUMARATE ER 300 MG PO TB24: 300.0000 mg | ORAL_TABLET | Freq: Every day | ORAL | 0 refills | Status: DC

## 2022-12-02 MED ORDER — TRAZODONE HCL 50 MG PO TABS: 50.0000 mg | ORAL_TABLET | Freq: Every evening | ORAL | 0 refills | Status: DC | PRN

## 2022-12-02 MED ORDER — LORAZEPAM 2 MG/ML IJ SOLN: 1.0000 mg | Freq: Once | INTRAMUSCULAR | Status: DC

## 2022-12-02 MED ORDER — ALBUTEROL SULFATE HFA 108 (90 BASE) MCG/ACT IN AERS
1.0000 | INHALATION_SPRAY | Freq: Four times a day (QID) | RESPIRATORY_TRACT | 0 refills | Status: DC | PRN
Start: 2022-12-02 — End: 2022-12-03

## 2022-12-02 MED ORDER — GABAPENTIN 100 MG PO CAPS
200.0000 mg | ORAL_CAPSULE | Freq: Two times a day (BID) | ORAL | Status: DC
  Administered 2022-12-02 – 2022-12-03 (×2): 200 mg via ORAL
  Filled 2022-12-02 (×2): qty 2

## 2022-12-02 MED ORDER — TRAZODONE HCL 50 MG PO TABS: 50.0000 mg | ORAL_TABLET | Freq: Every evening | ORAL | Status: DC | PRN

## 2022-12-02 MED ORDER — BICTEGRAVIR-EMTRICITAB-TENOFOV 50-200-25 MG PO TABS
1.0000 | ORAL_TABLET | Freq: Every day | ORAL | Status: DC
  Administered 2022-12-02 – 2022-12-03 (×2): 1 via ORAL
  Filled 2022-12-02 (×2): qty 1

## 2022-12-02 MED ORDER — ACETAMINOPHEN 500 MG PO TABS
1000.0000 mg | ORAL_TABLET | Freq: Three times a day (TID) | ORAL | Status: DC
  Administered 2022-12-02 – 2022-12-03 (×3): 1000 mg via ORAL
  Filled 2022-12-02 (×4): qty 2

## 2022-12-02 MED ORDER — SODIUM CHLORIDE 0.9 % IV SOLN: INTRAVENOUS | Status: DC

## 2022-12-02 MED ORDER — POTASSIUM CHLORIDE CRYS ER 20 MEQ PO TBCR: 40.0000 meq | EXTENDED_RELEASE_TABLET | Freq: Once | ORAL | Status: DC

## 2022-12-02 MED ORDER — ENOXAPARIN SODIUM 40 MG/0.4ML IJ SOSY
40.0000 mg | PREFILLED_SYRINGE | INTRAMUSCULAR | Status: DC
  Administered 2022-12-02: 40 mg via SUBCUTANEOUS
  Filled 2022-12-02 (×2): qty 0.4

## 2022-12-02 MED ORDER — ADULT MULTIVITAMIN W/MINERALS CH
1.0000 | ORAL_TABLET | Freq: Every day | ORAL | Status: DC
  Administered 2022-12-02 – 2022-12-03 (×2): 1 via ORAL
  Filled 2022-12-02 (×2): qty 1

## 2022-12-02 NOTE — ED Triage Notes (Signed)
"  [facility] called and said patient was there having abdominal pain since last night, fever, abdominal pain worsened today when he was being discharged. [facility] stated they gave him tylenol. Patient states he is also constipated with no BM in a couple of days. Patient is homeless" per EMS

## 2022-12-02 NOTE — ED Provider Notes (Signed)
FBC/OBS ASAP Discharge Summary  Date and Time: 12/02/2022 8:56 AM  Name: John [name]  MRN:  [id number]   Discharge Diagnoses:  Final diagnoses:  Paranoid schizophrenia (HCC)  Polysubstance use disorder  Psychoactive substance-induced psychosis (HCC)    Subjective: "I need to get some sleep, my stomach hurts"  Stay Summary: Zoltan Sellars is a 52 year old male with a history of chronic suicidal ideations, chronic substance use, EtOH abuse, schizoaffective disorder, recurrent generalized abdominal pain, HIV with poor medication compliance, asthma, homelessness, and tobacco dependence, was admitted to [name] yesterday for overnight observation due to recurrent suicidal ideations with multiple plans according to patient to kill himself.  He initially presented to the [facility] and reported he was very to receive outpatient services and apparently they called law enforcement and patient was transported here.  On chart review patient has had recurrent suicidal ideations.  Patient has been irritable towards staff during his admission here as he reports he just needs to get some sleep.  He did take his medications which were restarted during his admission.  He endorses depressed mood due to current living situation which is that he is homeless and lives mostly on the street.  Of note patient was admitted to [facility] back in July due to suicidal ideations.  On chart review there have been no recent inpatient psychiatric admissions since that time.  According the patient he is not currently taking his psychiatric medications as he did not have his medications.  Patient's medications are being sent to an outpatient pharmacy and patient has been encouraged to follow-up here at for [facility] behavioral health urgent care for outpatient services.  Abdominal Pain, Acute and Fever  On evaluation this morning patient initials vital signs were abnormal and he had a elevated temperature of 100.5.  Temperature was  rechecked 1 hour later and reduced to 99.5 however patient was mildly tachycardic.  Patient endorses that he has significant abdominal pain in the bilateral right and left upper quadrants of his abdomen.  He reports that he does not know the last time he has had a bowel movement.  Has felt nauseous but denies vomiting.  Of note his potassium level is low at 3.0 however he is unable to tolerate any medications this morning as he is endorsing nausea.  Patient's abdomen is distended.  Report called to Dr. Renata Caprice at [facility], ED who has accepted patient for medical evaluation will discharge once medically cleared to self.   Total Time spent with patient: 30 minutes  Past Psychiatric History: See HPI for Psychiatric History  Past Medical History: See HPI for Medical History  Family History: No pertinent family mental health history reported Family Psychiatric History: No pertinent family mental health history reported Social History: Homeless, Unemployed, ETOH and Polysubstance abuse   Tobacco Cessation:  N/A, patient does not currently use tobacco products  Current Medications:  No current facility-administered medications for this encounter.   Current Outpatient Medications  Medication Sig Dispense Refill   albuterol (VENTOLIN HFA) 108 (90 Base) MCG/ACT inhaler Inhale 1-2 puffs into the lungs every 6 (six) hours as needed for wheezing or shortness of breath. 8 g 0   bictegravir-emtricitabine-tenofovir AF (BIKTARVY) 50-200-25 MG TABS tablet Take 1 tablet by mouth daily. 30 tablet 0   busPIRone (BUSPAR) 5 MG tablet Take 1 tablet (5 mg total) by mouth 2 (two) times daily. 30 tablet 0   gabapentin (NEURONTIN) 100 MG capsule Take 2 capsules (200 mg total) by mouth 2 (two)  times daily. 30 capsule 0   mirtazapine (REMERON) 15 MG tablet Take 1 tablet (15 mg total) by mouth at bedtime. 30 tablet 0   QUEtiapine (SEROQUEL XR) 300 MG 24 hr tablet Take 1 tablet (300 mg total) by mouth at bedtime. 30 tablet 0    traZODone (DESYREL) 50 MG tablet Take 1 tablet (50 mg total) by mouth at bedtime as needed for sleep (sleep difficulty). 30 tablet 0    PTA Medications:  PTA Medications  Medication Sig   albuterol (VENTOLIN HFA) 108 (90 Base) MCG/ACT inhaler Inhale 1-2 puffs into the lungs every 6 (six) hours as needed for wheezing or shortness of breath.   bictegravir-emtricitabine-tenofovir AF (BIKTARVY) 50-200-25 MG TABS tablet Take 1 tablet by mouth daily.   busPIRone (BUSPAR) 5 MG tablet Take 1 tablet (5 mg total) by mouth 2 (two) times daily.   gabapentin (NEURONTIN) 100 MG capsule Take 2 capsules (200 mg total) by mouth 2 (two) times daily.   mirtazapine (REMERON) 15 MG tablet Take 1 tablet (15 mg total) by mouth at bedtime.   QUEtiapine (SEROQUEL XR) 300 MG 24 hr tablet Take 1 tablet (300 mg total) by mouth at bedtime.   traZODone (DESYREL) 50 MG tablet Take 1 tablet (50 mg total) by mouth at bedtime as needed for sleep (sleep difficulty).       05/03/2022    9:49 AM 02/25/2022   11:28 AM 02/10/2021    9:33 AM  Depression screen PHQ 2/9  Decreased Interest 0 2 0  Down, Depressed, Hopeless 0 2 0  PHQ - 2 Score 0 4 0  Altered sleeping  3   Tired, decreased energy  3   Change in appetite  3   Feeling bad or failure about yourself   3   Trouble concentrating  3   Moving slowly or fidgety/restless  3   Suicidal thoughts  3   PHQ-9 Score  25   Difficult doing work/chores  Very difficult     Flowsheet Row ED from 12/01/2022 in [facility] ED from 09/12/2022 in [facility] Emergency Department at [facility] ED from 07/20/2022 in [facility] Emergency Department at [facility]  C-SSRS RISK CATEGORY Low Risk High Risk No Risk       Musculoskeletal  Strength & Muscle Tone: within normal limits Gait & Station: normal Patient leans: N/A  Psychiatric Specialty Exam  Presentation  General Appearance:  Disheveled  Eye  Contact: Fair  Speech: Clear and Coherent  Speech Volume: Normal  Handedness: Right   Mood and Affect  Mood: Angry; Depressed; Irritable  Affect: Labile   Thought Process  Thought Processes: Coherent  Descriptions of Associations:Intact  Orientation:Full (Time, Place and Person)  Thought Content:Tangential  Diagnosis of Schizophrenia or Schizoaffective disorder in past: Yes  Duration of Psychotic Symptoms: Greater than six months   Hallucinations:Hallucinations: Auditory Description of Auditory Hallucinations: Telling him to hurt people  Ideas of Reference:None  Suicidal Thoughts:Suicidal Thoughts: Yes, Passive SI Passive Intent and/or Plan: Without Intent; Without Plan  Homicidal Thoughts:Homicidal Thoughts: No  Sensorium  Memory: Immediate Fair; Recent Fair; Remote Fair  Judgment: Poor  Insight: Poor   Executive Functions  Concentration: Fair  Attention Span: Fair  Recall: [employer] of Knowledge: Fair  Language: Fair   Psychomotor Activity  Psychomotor Activity: Psychomotor Activity: Normal   Assets  Assets: Desire for Improvement; Physical Health; Housing   Sleep  Sleep: Sleep: Poor Number of Hours of Sleep:  2   Nutritional Assessment (For OBS and FBC admissions only) Has the patient had a weight loss or gain of 10 pounds or more in the last 3 months?: No Has the patient had a decrease in food intake/or appetite?: No Does the patient have dental problems?: No Does the patient have eating habits or behaviors that may be indicators of an eating disorder including binging or inducing vomiting?: No Has the patient recently lost weight without trying?: 0 Has the patient been eating poorly because of a decreased appetite?: 1 Malnutrition Screening Tool Score: 1    Physical Exam  Physical Exam Vitals reviewed.  Constitutional:      Appearance: Normal appearance.  HENT:     Head: Normocephalic and atraumatic.  Eyes:      Extraocular Movements: Extraocular movements intact.     Pupils: Pupils are equal, round, and reactive to light.  Cardiovascular:     Rate and Rhythm: Regular rhythm. Tachycardia present.  Pulmonary:     Effort: Pulmonary effort is normal.     Breath sounds: Normal breath sounds.  Abdominal:     General: Abdomen is protuberant. There is distension.     Palpations: Abdomen is rigid.     Tenderness: There is abdominal tenderness in the right upper quadrant and left upper quadrant. There is guarding and rebound.    Neurological:     General: No focal deficit present.     Mental Status: He is alert.     Review of Systems  Gastrointestinal:  Positive for abdominal pain, constipation and nausea.    Blood pressure 127/81, pulse (!) 103, temperature 99.5 F (37.5 C), resp. rate 18, SpO2 98%. There is no height or weight on file to calculate BMI.  Demographic Factors:  Low socioeconomic status  Loss Factors: Financial problems/change in socioeconomic status  Historical Factors: Impulsivity  Risk Reduction Factors:   NA  Continued Clinical Symptoms:  Previous Psychiatric Diagnoses and Treatments Medical Diagnoses and Treatments/Surgeries  Cognitive Features That Contribute To Risk:  None    Suicide Risk:  Mild:  Suicidal ideation of limited frequency, intensity, duration, and specificity.  There are no identifiable plans, no associated intent, mild dysphoria and related symptoms, good self-control (both objective and subjective assessment), few other risk factors, and identifiable protective factors, including available and accessible social support.  Plan Of Care/Follow-up recommendations:  Other:  Recommend outpatient follow-up here at [facility]. Patient is being transported for medication. Patient continues to voice passive SI, however endorses needing medical evaluation for acute abdominal pain which reinforces his desire to obtain medical care to maintain a physical  health well-being which is arbitrary to someone who is actively suicidal.        Medications refilled. Follow-up outpatient.  Disposition: Home to Self   Joaquin Courts, NP-C 12/02/2022, 8:56 AM

## 2022-12-02 NOTE — Progress Notes (Signed)
Receive patient from [facility], ambulatory to restroom, alert and oriented time four. Pt had a bowel movement once arrive to the floor.

## 2022-12-02 NOTE — ED Provider Notes (Signed)
[name] EMERGENCY DEPARTMENT AT [facility] Provider Note   CSN: [id number] Arrival date & time: 12/02/22  4098     History Schizophrenia, HTN, Alcoholism  Chief Complaint  Patient presents with   Abdominal Pain    John [name] is a 52 y.o. male.  52 year old male with a past medical history of mental health on extensive medications presents to the ED from [facility] after sudden onset of abdominal pain which began yesterday.  He was being discharged this morning and felt that the abdominal pain was worsening, was found to be febrile with a subjective fever of over 100, given Tylenol.  Patient endorses generalized abdominal pain without any focal point of tenderness although more so along the lower abdomen.  He also reports his last bowel movement was several days ago, and it was very runny when he was having a bowel movement.  He has not taken any medication for improvement in symptoms aside from his Tylenol.  He also endorses some nausea but has not had any vomiting.  He denies any alcohol use.  Patient is homeless at baseline.  No hematemesis, no melena,no diarrhea.   The history is provided by the patient.  Abdominal Pain Associated symptoms: constipation, fever and nausea   Associated symptoms: no chest pain, no chills, no diarrhea, no shortness of breath and no vomiting        Home Medications Prior to Admission medications   Medication Sig Start Date End Date Taking? Authorizing Provider  albuterol (VENTOLIN HFA) 108 (90 Base) MCG/ACT inhaler Inhale 1-2 puffs into the lungs every 6 (six) hours as needed for wheezing or shortness of breath. 12/02/22   Bing Neighbors, NP  bictegravir-emtricitabine-tenofovir AF (BIKTARVY) 50-200-25 MG TABS tablet Take 1 tablet by mouth daily. 12/02/22   Bing Neighbors, NP  busPIRone (BUSPAR) 5 MG tablet Take 1 tablet (5 mg total) by mouth 2 (two) times daily. 12/02/22   Bing Neighbors, NP  gabapentin (NEURONTIN) 100 MG capsule  Take 2 capsules (200 mg total) by mouth 2 (two) times daily. 12/02/22 01/01/23  Bing Neighbors, NP  mirtazapine (REMERON) 15 MG tablet Take 1 tablet (15 mg total) by mouth at bedtime. 12/02/22   Bing Neighbors, NP  QUEtiapine (SEROQUEL XR) 300 MG 24 hr tablet Take 1 tablet (300 mg total) by mouth at bedtime. 12/02/22 04/01/23  Bing Neighbors, NP  traZODone (DESYREL) 50 MG tablet Take 1 tablet (50 mg total) by mouth at bedtime as needed for sleep (sleep difficulty). 12/02/22 04/01/23  Bing Neighbors, NP  dicyclomine (BENTYL) 20 MG tablet Take 1 tablet (20 mg total) by mouth 2 (two) times daily. 01/08/17 01/03/20  Palumbo, April, MD  sucralfate (CARAFATE) 1 GM/10ML suspension Take 10 mLs (1 g total) by mouth 4 (four) times daily -  with meals and at bedtime. Patient not taking: Reported on 10/21/2017 01/08/17 09/03/18  Nicanor Alcon, April, MD      Allergies    Shellfish allergy    Review of Systems   Review of Systems  Constitutional:  Positive for fever. Negative for chills.  Respiratory:  Negative for shortness of breath.   Cardiovascular:  Negative for chest pain.  Gastrointestinal:  Positive for abdominal pain, constipation and nausea. Negative for diarrhea, rectal pain and vomiting.  Genitourinary:  Negative for flank pain.  Musculoskeletal:  Negative for back pain.  All other systems reviewed and are negative.   Physical Exam Updated Vital Signs BP (!) 125/96  Pulse 88   Temp 98.3 F (36.8 C) (Oral)   Resp 17   Ht 5\' 9"  (1.753 m)   Wt 72.6 kg   SpO2 100%   BMI 23.63 kg/m  Physical Exam Vitals and nursing note reviewed.  Constitutional:      Appearance: He is well-developed.  HENT:     Head: Normocephalic and atraumatic.  Cardiovascular:     Rate and Rhythm: Normal rate.  Pulmonary:     Effort: Pulmonary effort is normal.  Abdominal:     General: Bowel sounds are decreased. There is distension.     Tenderness: There is generalized abdominal tenderness. There is  guarding. There is no right CVA tenderness or left CVA tenderness.  Skin:    General: Skin is warm and dry.  Neurological:     Mental Status: He is alert and oriented to person, place, and time.     ED Results / Procedures / Treatments   Labs (all labs ordered are listed, but only abnormal results are displayed) Labs Reviewed  CBC WITH DIFFERENTIAL/PLATELET - Abnormal; Notable for the following components:      Result Value   RBC 4.21 (*)    HCT 38.4 (*)    All other components within normal limits  COMPREHENSIVE METABOLIC PANEL - Abnormal; Notable for the following components:   Glucose, Bld 100 (*)    Calcium 8.5 (*)    Total Protein 6.4 (*)    Albumin 3.2 (*)    All other components within normal limits  SARS CORONAVIRUS 2 BY RT PCR  LIPASE, BLOOD  URINALYSIS, ROUTINE W REFLEX MICROSCOPIC    EKG None  Radiology CT ABDOMEN PELVIS W CONTRAST  Result Date: 12/02/2022 CLINICAL DATA:  Nonlocalized abdominal pain. History of previous trauma with splenic injury. EXAM: CT ABDOMEN AND PELVIS WITH CONTRAST TECHNIQUE: Multidetector CT imaging of the abdomen and pelvis was performed using the standard protocol following bolus administration of intravenous contrast. RADIATION DOSE REDUCTION: This exam was performed according to the departmental dose-optimization program which includes automated exposure control, adjustment of the mA and/or kV according to patient size and/or use of iterative reconstruction technique. CONTRAST:  75mL OMNIPAQUE IOHEXOL 350 MG/ML SOLN COMPARISON:  CT scan 12/01/2021 FINDINGS: Lower chest: There is some linear opacity lung bases likely scar or atelectasis. No pleural effusion. Small hiatal hernia. Hepatobiliary: No space-occupying liver lesion. Patent portal vein. Slight intrahepatic biliary duct ectasia, unchanged from previous. Gallbladder is nondilated. Pancreas: Unremarkable. No pancreatic ductal dilatation or surrounding inflammatory changes. Spleen:  Progressive healing of the superior splenic injury. Small low-density cleft in this location. Separate small anterior splenule. The spleen itself is nonenlarged and has preserved enhancement otherwise. Adrenals/Urinary Tract: Adrenal glands are preserved. No enhancing renal mass or collecting system dilatation. Ureters have normal course and caliber down to the bladder. Preserved contours of the urinary bladder. Stomach/Bowel: Stomach is distended with fluid and debris. Large bowel has significant colonic stool particularly along the left side. There are areas of fluid and air along the distended right side of the colon. Normal appendix in the right lower quadrant. There is also several fluid-filled dilated loops of small bowel diffusely measuring up to 4.0 cm. Subtle transition suggested in the right hemipelvis along the distal ileum but this has a relatively slow transition. No pneumatosis or free air Vascular/Lymphatic: No significant vascular findings are present. No enlarged abdominal or pelvic lymph nodes. Reproductive: Prostate is unremarkable. Other: Anasarca. No free air or rim enhancing fluid collections. Small fat  containing inguinal hernias. Musculoskeletal: Degenerative changes seen of the spine and pelvis. Some disc bulging seen along the lower lumbar levels. There are bridging osteophytes along the sacroiliac joints on the right IMPRESSION: Dilated fluid-filled loops of small bowel diffusely. There is also distended right side of the colon with air-fluid levels. Prominent left-sided colonic stool particularly towards the rectum. There are some additional non dilated loops in the right lower quadrant but no clear abrupt transition. Although a developing partial obstruction is in the differential this very well could be an ileus. Please correlate with clinical presentation and short follow-up. Increasing anasarca and some haziness to the mesentery. No obvious free air. Maturing, healing changes of the  spleen in the area of previous injury. Stable mild intrahepatic biliary duct ectasia. Gallbladder is contracted. Critical Value/emergent results were called by telephone at the time of interpretation on 12/02/2022 at 10:30 am to provider Dr. Posey Rea, Who verbally acknowledged these results. Electronically Signed   By: Karen Kays M.D.   On: 12/02/2022 13:41    Procedures Procedures    Medications Ordered in ED Medications  fentaNYL (SUBLIMAZE) injection 50 mcg (50 mcg Intravenous Given 12/02/22 1030)  sodium chloride 0.9 % bolus 1,000 mL ( Intravenous Restarted 12/02/22 1400)  ondansetron (ZOFRAN) injection 4 mg (4 mg Intravenous Given 12/02/22 1030)  iohexol (OMNIPAQUE) 350 MG/ML injection 75 mL (75 mLs Intravenous Contrast Given 12/02/22 1130)    ED Course/ Medical Decision Making/ A&P                                 Medical Decision Making Amount and/or Complexity of Data Reviewed Labs: ordered. Radiology: ordered.  Risk Prescription drug management.    This patient presents to the ED for concern of abdominal pain, this involves a number of treatment options, and is a complaint that carries with it a high risk of complications and morbidity.  The differential diagnosis includes SBO, appendicitis versus cholecystitis.    Co morbidities: Discussed in HPI   Brief History:  See HPI.   EMR reviewed including pt PMHx, past surgical history and past visits to ER.   See HPI for more details   Lab Tests:  I ordered and independently interpreted labs.  The pertinent results include:    I personally reviewed all laboratory work and imaging. Metabolic panel without any acute abnormality specifically kidney function within normal limits and no significant electrolyte abnormalities. CBC without leukocytosis or significant anemia.  Imaging Studies:  CT Abdomen and pelvis showed: IMPRESSION:  Dilated fluid-filled loops of small bowel diffusely. There is also  distended right side  of the colon with air-fluid levels. Prominent  left-sided colonic stool particularly towards the rectum. There are  some additional non dilated loops in the right lower quadrant but no  clear abrupt transition. Although a developing partial obstruction  is in the differential this very well could be an ileus. Please  correlate with clinical presentation and short follow-up.    Increasing anasarca and some haziness to the mesentery. No obvious  free air.    Maturing, healing changes of the spleen in the area of previous  injury.    Stable mild intrahepatic biliary duct ectasia. Gallbladder is  contracted.   Medicines ordered:  I ordered medication including zofran,fentanyl,bolus  for symptomatic treatment Reevaluation of the patient after these medicines showed that the patient improved I have reviewed the patients home medicines and have made adjustments as  needed  Consults:  I requested consultation with general surgery spoke to [facility] and discussed lab and imaging findings as well as pertinent plan - they recommend: holding off on NG tube placement. Will evaluate patient while in the ED.   Reevaluation:  After the interventions noted above I re-evaluated patient and found that they have :stayed the same   Social Determinants of Health:  The patient's social determinants of health were a factor in the care of this patient  Problem List / ED Course:  Patient presents to the ED with a chief complaint of abdominal pain sent in from [facility] after discharge.  Sudden onset of pain which began yesterday found to be febrile prior to leaving facility, given Tylenol.  Reports his last bowel movement was several days ago, no blood in his stool that he reports.  No nausea or vomiting but feels that his stomach is somewhat distended.  He is on plenty mental health medications.  No prior surgical history to his abdomen reported according to prior records. CBC without any leukocytosis, no  signs of anemia.  CMP without any electrolyte derangement although glucose is slightly elevated.  Creatinine levels unremarkable.  LFTs are within normal limits.  Lipase level is normal.  COVID-19 testing is negative, UA is pending.  He received fentanyl, Zofran, bolus to help with symptomatic treatment, although his IV blew and he was only able to receive part of these fluids.  CT of his abdomen shows dilated loop concerning for partial bowel obstruction versus ileus.  I discussed this case with general surgery APP who recommended holding off on NG tube placement, after review of records they feel that this is more so an ileus.  This can be managed by gastroenterology versus medicine. Spoke to internal medicine service who will admit patient for further management.   Dispostion:  After consideration of the diagnostic results and the patients response to treatment, I feel that the patent would benefit from admission further management of his ileus.    Portions of this note were generated with Scientist, clinical (histocompatibility and immunogenetics). Dictation errors may occur despite best attempts at proofreading.   Final Clinical Impression(s) / ED Diagnoses Final diagnoses:  Generalized abdominal pain  Ileus [facility])    Rx / DC Orders ED Discharge Orders     None         Claude Manges, PA-C 12/02/22 1426    Kommor, Piqua, MD 12/02/22 Ernestina Columbia

## 2022-12-02 NOTE — ED Notes (Signed)
Pt sleeping at present, no distress noted.  Monitoring for safety. 

## 2022-12-02 NOTE — ED Notes (Signed)
Pt incontinent of urine in bed. Up to bathroom with steady gait. Given fresh clothes and towels to clean self up.

## 2022-12-02 NOTE — H&P (Addendum)
Date: 12/02/2022               Patient Name:  John [name] MRN: [id number]  DOB: 1971/03/12 Age / Sex: 52 y.o., male   PCP: Pcp, No         Medical Service: Internal Medicine Teaching Service         Attending Physician: Dr. Mercie Eon, MD      First Contact: Dr. Katheran James, DO Pager [phone]    Second Contact: Dr. Rocky Morel, DO Pager [phone]         After Hours (After 5p/  First Contact Pager: [phone]  weekends / holidays): Second Contact Pager: [phone]   SUBJECTIVE   Chief Complaint: Stomach pain   History of Present Illness:   John [name] is a 52 yo with a past medical history of HIV, chronic suicidal ideation, asthma, schizoaffective disorder, and chronic substance use presenting for abdominal pain and admitted for ileus.   John [name] began experiencing sharp, progressively worsening abdominal yesterday when he was in observation at [facility] for recurrent suicidal ideation with multiple plans. The pain is localized to the middle of his stomach and does not radiate. He rates it a 10/10. He reports that he initially had constipation with his last bowel movement on 9/17, that changed to diarrhea yesterday evening. He has had 4 episodes of diarrhea since then, with his most recent at [facility] today. He describes the diarrhea as brown and watery, without blood or mucus. He is able to pass flatus. He reports that he was told he had a fever yesterday at [facility], but he denies chills or arthralgias. He has been able to tolerate eating and drinking, but has had decreased appetite since abdominal pain started. He denies shortness of breath, chest pain, nausea, vomiting, urinary frequency or dysuria. He has not had abdominal surgery before. He has never experienced anything like this before.   ED Course: He presented to the ED from [facility] with abdominal pain that was progressively worsening. He was tachycardiac, but otherwise vital signs stable on presentation. CBC and CMP  unremarkable. He received Fentanyl and ondansetron 4mg . CT abdomen pelvis with contrast showed dilated fluid filled loops of small bowel diffusely and distended right side of colon with air-fluid levels. General surgery was consulted to review imaging which is consistent with ileus. General surgery recommended bowel rest and NG tube if he has emesis.    Meds:  Current Meds  Medication Sig   albuterol (VENTOLIN HFA) 108 (90 Base) MCG/ACT inhaler Inhale 1-2 puffs into the lungs every 6 (six) hours as needed for wheezing or shortness of breath.   bictegravir-emtricitabine-tenofovir AF (BIKTARVY) 50-200-25 MG TABS tablet Take 1 tablet by mouth daily.   busPIRone (BUSPAR) 5 MG tablet Take 1 tablet (5 mg total) by mouth 2 (two) times daily.   gabapentin (NEURONTIN) 100 MG capsule Take 2 capsules (200 mg total) by mouth 2 (two) times daily.   mirtazapine (REMERON) 15 MG tablet Take 1 tablet (15 mg total) by mouth at bedtime.   QUEtiapine (SEROQUEL XR) 300 MG 24 hr tablet Take 1 tablet (300 mg total) by mouth at bedtime.   traZODone (DESYREL) 50 MG tablet Take 1 tablet (50 mg total) by mouth at bedtime as needed for sleep (sleep difficulty).    Past Medical History Schizoaffective, bipolar type  Generalized anxiety  Chronic polysubstance use (cocaine and THC)  Chronic suicidal ideation  HIV  Asthma    Past Surgical History:  Procedure Laterality Date  SKIN GRAFT FULL THICKNESS LEG Left ?   POSTERIOR LEFT LEG  AFTER DOG BITES    Social:  Lives With: His grandma (recent documentation from [facility] yesterday reports that he is homeless) Occupation: Per chart review he is on disability  Level of Function: Independent ADL and iADL  PCP: None Substances: Denies tobacco, alcohol and illicit drug use. His UDS is positive for cocaine and THC  Family History:  Patient reports no family history   Allergies: Allergies as of 12/02/2022 - Review Complete 12/02/2022  Allergen Reaction Noted    Shellfish allergy Anaphylaxis and Swelling 03/14/2011    Review of Systems: A complete ROS was negative except as per HPI.   OBJECTIVE:   Physical Exam: Blood pressure (!) 125/96, pulse 88, temperature 98.3 F (36.8 C), temperature source Oral, resp. rate 17, height 5\' 9"  (1.753 m), weight 72.6 kg, SpO2 100%.  Constitutional: tired-appearing  lying in bed, in no acute distress HENT: normocephalic atraumatic, mucous membranes moist Eyes: conjunctiva non-erythematous Cardiovascular: regular rate and rhythm, no m/r/g Pulmonary/Chest: normal work of breathing on room air, lungs clear to auscultation bilaterally Abdominal: soft, non-tender, non-distended. Present bowel sounds.  Skin: warm and dry Psych: withdrawn   Labs: CBC    Component Value Date/Time   WBC 8.1 12/02/2022 1020   RBC 4.21 (L) 12/02/2022 1020   HGB 13.0 12/02/2022 1020   HCT 38.4 (L) 12/02/2022 1020   PLT 383 12/02/2022 1020   MCV 91.2 12/02/2022 1020   MCH 30.9 12/02/2022 1020   MCHC 33.9 12/02/2022 1020   RDW 13.3 12/02/2022 1020   LYMPHSABS 1.7 12/02/2022 1020   MONOABS 0.7 12/02/2022 1020   EOSABS 0.1 12/02/2022 1020   BASOSABS 0.0 12/02/2022 1020     CMP     Component Value Date/Time   NA 139 12/02/2022 1020   K 3.5 12/02/2022 1020   CL 108 12/02/2022 1020   CO2 22 12/02/2022 1020   GLUCOSE 100 (H) 12/02/2022 1020   BUN 12 12/02/2022 1020   CREATININE 0.93 12/02/2022 1020   CREATININE 0.98 10/27/2020 1038   CALCIUM 8.5 (L) 12/02/2022 1020   PROT 6.4 (L) 12/02/2022 1020   ALBUMIN 3.2 (L) 12/02/2022 1020   AST 24 12/02/2022 1020   ALT 21 12/02/2022 1020   ALKPHOS 64 12/02/2022 1020   BILITOT 0.7 12/02/2022 1020   GFRNONAA >60 12/02/2022 1020   GFRNONAA 103 07/10/2020 1033   GFRAA 120 07/10/2020 1033    Imaging: CT abdomen pelvis with contrast: Dilated fluid filled loops of small bowel with distended right colon with air fluid levels and prominent left sided colonic stool burden.   EKG:  personally reviewed my interpretation is no EKG.   ASSESSMENT & PLAN:   Assessment & Plan by Problem: Principal Problem:   Partial bowel obstruction (HCC) Active Problems:   Bipolar disorder, mixed (HCC)   John [name] is a 52 y.o. with a past medical history of HIV, chronic suicidal ideation, asthma, schizoaffective disorder, and chronic substance use presenting for abdominal pain and admitted for ileus on hospital day 0  #Ileus  Patient has had 1 day of progressively worsening abdominal pain and constipation that progressed to diarrhea. His vital signs have been stable since his pain has been controlled. K (9/18) 3.0, today 3.5. CT abdomen and pelvis with contrast revealed dilated fluid filled loops of small bowel, distended right colon with air fluid levels and prominent left sided colonic stool burden. CT was concerning for developing partial obstruction vs ileus.  General surgery was consulted and thought that it was most consistent with an ileus. Recommended NPO and bowel rest. Possible etiology of ileus is hypokalemia vs anticholinergic effects of medication. Hypokalemia has resolved as of today, but was present yesterday when symptoms began. Both trazodone and quetiapine have anticholinergic effects which may contribute to ileus as can cause decreased gastric motility.  -NPO -IV NS 146mL/hr  -Acetaminophen 1,000mg  q8h for pain  -Discuss with patient NG tube if emesis -Holding quetiapine and trazodone due to anticholinergic effects which can contribute to ileus   #HIV  Patient has a history of HIV. Last CD4 count 525 (05/04/22) and HIV 1 RNA 1,480 copies (04/20/22). His home regimen is Biktarvy 50-200-25mg  one tablet daily. -Continue Biktarvy   #Schizoaffective, bipolar type  #Generalized anxiety  Medication prescribed by [facility] is quetiapine, mirtazapine, trazodone, Buspar and gabapentin.  -Hold quetiapine 300mg  at bedtime due to anticholinergic effects  -Hold trazodone 50mg  at  bedtime as needed due to anticholinergic effects   -Continue mirtazapine 15mg  at bedtime  -Continue gabapentin 200mg  twice daily  -Continue Buspar 5mg  twice daily  #Asthma  Home medication is albuterol as needed -Continue albuterol as needed   #Alcohol Use  Patient has a history of alcohol use. He denied alcohol use today. Per labs < 10 mg/dL of alcohol (1/91). -CIWA without ativan -Thiamine 100mg  daily  -Folic acid 1mg  daily  -Multivitamin daily   #Chronic polysubstance use  Patient has a history of cocaine and THC use. UDS positive for cocaine and THC. -TOC consulted about substance use   Diet: NPO VTE: Enoxaparin IVF: NS,100cc/hr Code: Full  Prior to Admission Living Arrangement:  Per patient he lives with his grandma Anticipated Discharge Location: Home Barriers to Discharge: resolution of ileus  Dispo: Admit patient to Observation with expected length of stay less than 2 midnights.  Signed: Kristie Cowman, MS3 Pager: [phone] 12/02/2022, 3:34 PM    Attestation for Student Documentation:  I personally was present and re-performed the history, physical exam and medical decision-making activities of this service and have verified that the service and findings are accurately documented in the student's note.  Katheran James, DO 12/02/2022, 6:08 PM

## 2022-12-02 NOTE — Discharge Instructions (Addendum)
Availability is limited; therefore, patients may not be seen on the same day  Medication management walk-ins:  Monday to Friday: 8 AM to 11 AM.  It is recommended that patients arrive by 7:30 AM to 7:45 AM because patients will be seen in the order of arrival.  Go to the second floor on arrival and check in.  **Availability is limited; therefore, patients may not be seen on the same day.**

## 2022-12-02 NOTE — ED Notes (Signed)
ED TO INPATIENT HANDOFF REPORT  ED Nurse Name and Phone #: Dahlia Client [phone]  S Name/Age/Gender John [name] 52 y.o. male Room/Bed: H015C/H015C  Code Status   Code Status: Prior  Home/SNF/Other Home Patient oriented to: self, place, time, and situation Is this baseline? Yes   Triage Complete: Triage complete  Chief Complaint fever  Triage Note "[facility] called and said patient was there having abdominal pain since last night, fever, abdominal pain worsened today when he was being discharged. [facility] stated they gave him tylenol. Patient states he is also constipated with no BM in a couple of days. Patient is homeless" per EMS   Allergies Allergies  Allergen Reactions   Shellfish Allergy Anaphylaxis and Swelling    Level of Care/Admitting Diagnosis ED Disposition     ED Disposition  Admit   Condition  --   Comment  The patient appears reasonably stabilized for admission considering the current resources, flow, and capabilities available in the ED at this time, and I doubt any other [facility] requiring further screening and/or treatment in the ED prior to admission is  present.          B Medical/Surgery History Past Medical History:  Diagnosis Date   Acute hypoxemic respiratory failure (HCC) 05/07/2021   Adjustment disorder with depressed mood 10/28/2015   Alcoholism (HCC)    Asthma    Bipolar disorder (HCC)    with depression/anxiety   Cannabis use disorder, moderate, dependence (HCC) 04/02/2015   Chronic low back pain    Cocaine use disorder (HCC) 04/02/2015   Gout    HIV (human immunodeficiency virus infection) (HCC)    "dx'd ~ 2 yr ago" (09/29/2012)   Homelessness    Hypertension    Polysubstance abuse (HCC) 04/11/2019   Past Surgical History:  Procedure Laterality Date   SKIN GRAFT FULL THICKNESS LEG Left ?   POSTERIOR LEFT LEG  AFTER DOG BITES     A IV Location/Drains/Wounds Patient Lines/Drains/Airways Status     Active Line/Drains/Airways     Name  Placement date Placement time Site Days   Peripheral IV 12/02/22 20 G Anterior;Right;Upper Arm 12/02/22  1400  Arm  less than 1            Intake/Output Last 24 hours  Intake/Output Summary (Last 24 hours) at 12/02/2022 1429 Last data filed at 12/02/2022 1427 Gross per 24 hour  Intake 1000 ml  Output --  Net 1000 ml    Labs/Imaging Results for orders placed or performed during the hospital encounter of 12/02/22 (from the past 48 hour(s))  SARS Coronavirus 2 by RT PCR (hospital order, performed in [facility] hospital lab) *cepheid single result test* Anterior Nasal Swab     Status: None   Collection Time: 12/02/22 10:04 AM   Specimen: Anterior Nasal Swab  Result Value Ref Range   SARS Coronavirus 2 by RT PCR NEGATIVE NEGATIVE    Comment: Performed at [facility], [address], [place], [place] [address]  CBC with Differential     Status: Abnormal   Collection Time: 12/02/22 10:20 AM  Result Value Ref Range   WBC 8.1 4.0 - 10.5 K/uL   RBC 4.21 (L) 4.22 - 5.81 MIL/uL   Hemoglobin 13.0 13.0 - 17.0 g/dL   HCT 57.8 (L) 46.9 - 62.9 %   MCV 91.2 80.0 - 100.0 fL   MCH 30.9 26.0 - 34.0 pg   MCHC 33.9 30.0 - 36.0 g/dL   RDW 52.8 41.3 - 24.4 %  Platelets 383 150 - 400 K/uL   nRBC 0.0 0.0 - 0.2 %   Neutrophils Relative % 69 %   Neutro Abs 5.6 1.7 - 7.7 K/uL   Lymphocytes Relative 21 %   Lymphs Abs 1.7 0.7 - 4.0 K/uL   Monocytes Relative 9 %   Monocytes Absolute 0.7 0.1 - 1.0 K/uL   Eosinophils Relative 1 %   Eosinophils Absolute 0.1 0.0 - 0.5 K/uL   Basophils Relative 0 %   Basophils Absolute 0.0 0.0 - 0.1 K/uL   Immature Granulocytes 0 %   Abs Immature Granulocytes 0.02 0.00 - 0.07 K/uL    Comment: Performed at [facility], [address], [place], [place] [address]  Comprehensive metabolic panel     Status: Abnormal   Collection Time: 12/02/22 10:20 AM  Result Value Ref Range   Sodium 139 135 - 145 mmol/L   Potassium 3.5 3.5 - 5.1 mmol/L   Chloride 108  98 - 111 mmol/L   CO2 22 22 - 32 mmol/L   Glucose, Bld 100 (H) 70 - 99 mg/dL    Comment: Glucose reference range applies only to samples taken after fasting for at least 8 hours.   BUN 12 6 - 20 mg/dL   Creatinine, Ser 2.95 0.61 - 1.24 mg/dL   Calcium 8.5 (L) 8.9 - 10.3 mg/dL   Total Protein 6.4 (L) 6.5 - 8.1 g/dL   Albumin 3.2 (L) 3.5 - 5.0 g/dL   AST 24 15 - 41 U/L   ALT 21 0 - 44 U/L   Alkaline Phosphatase 64 38 - 126 U/L   Total Bilirubin 0.7 0.3 - 1.2 mg/dL   GFR, Estimated >62 >13 mL/min    Comment: (NOTE) Calculated using the CKD-EPI Creatinine Equation (2021)    Anion gap 9 5 - 15    Comment: Performed at [facility], [address], [place], [place] [address]  Lipase, blood     Status: None   Collection Time: 12/02/22 10:20 AM  Result Value Ref Range   Lipase 24 11 - 51 U/L    Comment: Performed at [facility], [address], [place], [place] [address]   CT ABDOMEN PELVIS W CONTRAST  Result Date: 12/02/2022 CLINICAL DATA:  Nonlocalized abdominal pain. History of previous trauma with splenic injury. EXAM: CT ABDOMEN AND PELVIS WITH CONTRAST TECHNIQUE: Multidetector CT imaging of the abdomen and pelvis was performed using the standard protocol following bolus administration of intravenous contrast. RADIATION DOSE REDUCTION: This exam was performed according to the departmental dose-optimization program which includes automated exposure control, adjustment of the mA and/or kV according to patient size and/or use of iterative reconstruction technique. CONTRAST:  75mL OMNIPAQUE IOHEXOL 350 MG/ML SOLN COMPARISON:  CT scan 12/01/2021 FINDINGS: Lower chest: There is some linear opacity lung bases likely scar or atelectasis. No pleural effusion. Small hiatal hernia. Hepatobiliary: No space-occupying liver lesion. Patent portal vein. Slight intrahepatic biliary duct ectasia, unchanged from previous. Gallbladder is nondilated. Pancreas: Unremarkable. No pancreatic ductal  dilatation or surrounding inflammatory changes. Spleen: Progressive healing of the superior splenic injury. Small low-density cleft in this location. Separate small anterior splenule. The spleen itself is nonenlarged and has preserved enhancement otherwise. Adrenals/Urinary Tract: Adrenal glands are preserved. No enhancing renal mass or collecting system dilatation. Ureters have normal course and caliber down to the bladder. Preserved contours of the urinary bladder. Stomach/Bowel: Stomach is distended with fluid and debris. Large bowel has significant colonic stool particularly along  the left side. There are areas of fluid and air along the distended right side of the colon. Normal appendix in the right lower quadrant. There is also several fluid-filled dilated loops of small bowel diffusely measuring up to 4.0 cm. Subtle transition suggested in the right hemipelvis along the distal ileum but this has a relatively slow transition. No pneumatosis or free air Vascular/Lymphatic: No significant vascular findings are present. No enlarged abdominal or pelvic lymph nodes. Reproductive: Prostate is unremarkable. Other: Anasarca. No free air or rim enhancing fluid collections. Small fat containing inguinal hernias. Musculoskeletal: Degenerative changes seen of the spine and pelvis. Some disc bulging seen along the lower lumbar levels. There are bridging osteophytes along the sacroiliac joints on the right IMPRESSION: Dilated fluid-filled loops of small bowel diffusely. There is also distended right side of the colon with air-fluid levels. Prominent left-sided colonic stool particularly towards the rectum. There are some additional non dilated loops in the right lower quadrant but no clear abrupt transition. Although a developing partial obstruction is in the differential this very well could be an ileus. Please correlate with clinical presentation and short follow-up. Increasing anasarca and some haziness to the mesentery.  No obvious free air. Maturing, healing changes of the spleen in the area of previous injury. Stable mild intrahepatic biliary duct ectasia. Gallbladder is contracted. Critical Value/emergent results were called by telephone at the time of interpretation on 12/02/2022 at 10:30 am to provider Dr. Posey Rea, Who verbally acknowledged these results. Electronically Signed   By: Karen Kays M.D.   On: 12/02/2022 13:41    Pending Labs Unresulted Labs (From admission, onward)     Start     Ordered   12/02/22 0939  Urinalysis, Routine w reflex microscopic -Urine, Clean Catch  (ED Abdominal Pain)  Once,   URGENT       Question:  Specimen Source  Answer:  Urine, Clean Catch   12/02/22 0939            Vitals/Pain Today's Vitals   12/02/22 0937 12/02/22 0938 12/02/22 0942 12/02/22 1312  BP:   (!) 141/103 (!) 125/96  Pulse:   93 88  Resp:   18 17  Temp:   97.9 F (36.6 C) 98.3 F (36.8 C)  TempSrc:   Oral Oral  SpO2:   100% 100%  Weight:  72.6 kg    Height:  5\' 9"  (1.753 m)    PainSc: 10-Worst pain ever       Isolation Precautions Airborne and Contact precautions  Medications Medications  fentaNYL (SUBLIMAZE) injection 50 mcg (50 mcg Intravenous Given 12/02/22 1030)  sodium chloride 0.9 % bolus 1,000 mL (0 mLs Intravenous Stopped 12/02/22 1427)  ondansetron (ZOFRAN) injection 4 mg (4 mg Intravenous Given 12/02/22 1030)  iohexol (OMNIPAQUE) 350 MG/ML injection 75 mL (75 mLs Intravenous Contrast Given 12/02/22 1130)    Mobility walks     Focused Assessments     R Recommendations: See Admitting Provider Note  Report given to:   Additional Notes: Surg consult

## 2022-12-02 NOTE — Plan of Care (Signed)
?  Problem: Elimination: ?Goal: Will not experience complications related to bowel motility ?Outcome: Progressing ?  ?Problem: Pain Managment: ?Goal: General experience of comfort will improve ?Outcome: Progressing ?  ?Problem: Safety: ?Goal: Ability to remain free from injury will improve ?Outcome: Progressing ?  ?

## 2022-12-02 NOTE — Progress Notes (Signed)
Patient with diffuse dilation of the small bowel along with the right/transverse colon, consistent with ileus. Large stool burden.  Would recommend bowel rest. Place NG tube if he has emesis.  Consider GI consult for assistance with management of ileus if needed. If concerned for bowel obstruction please call us back.    Hosie Spangle, PA-C [facility] Please see Amion for pager number during day hours 7:00am-4:30pm

## 2022-12-02 NOTE — ED Notes (Signed)
Patient transported to CT 

## 2022-12-03 ENCOUNTER — Other Ambulatory Visit (HOSPITAL_COMMUNITY): Payer: Self-pay

## 2022-12-03 DIAGNOSIS — R1084 Generalized abdominal pain: Secondary | ICD-10-CM

## 2022-12-03 DIAGNOSIS — F250 Schizoaffective disorder, bipolar type: Secondary | ICD-10-CM

## 2022-12-03 DIAGNOSIS — K567 Ileus, unspecified: Secondary | ICD-10-CM

## 2022-12-03 LAB — CBC
HCT: 38.1 % — ABNORMAL LOW (ref 39.0–52.0)
Hemoglobin: 12.7 g/dL — ABNORMAL LOW (ref 13.0–17.0)
MCH: 30.4 pg (ref 26.0–34.0)
MCHC: 33.3 g/dL (ref 30.0–36.0)
MCV: 91.1 fL (ref 80.0–100.0)
Platelets: 388 10*3/uL (ref 150–400)
RBC: 4.18 MIL/uL — ABNORMAL LOW (ref 4.22–5.81)
RDW: 13.2 % (ref 11.5–15.5)
WBC: 3.5 10*3/uL — ABNORMAL LOW (ref 4.0–10.5)
nRBC: 0 % (ref 0.0–0.2)

## 2022-12-03 LAB — BASIC METABOLIC PANEL
Anion gap: 8 (ref 5–15)
BUN: 5 mg/dL — ABNORMAL LOW (ref 6–20)
CO2: 23 mmol/L (ref 22–32)
Calcium: 7.9 mg/dL — ABNORMAL LOW (ref 8.9–10.3)
Chloride: 108 mmol/L (ref 98–111)
Creatinine, Ser: 0.89 mg/dL (ref 0.61–1.24)
GFR, Estimated: >60 mL/min (ref >60)
Glucose, Bld: 87 mg/dL (ref 70–99)
Potassium: 3.2 mmol/L — ABNORMAL LOW (ref 3.5–5.1)
Sodium: 139 mmol/L (ref 135–145)

## 2022-12-03 LAB — GLUCOSE, CAPILLARY: Glucose-Capillary: 156 mg/dL — ABNORMAL HIGH (ref 70–99)

## 2022-12-03 MED ORDER — TRAZODONE HCL 50 MG PO TABS
50.0000 mg | ORAL_TABLET | Freq: Every evening | ORAL | 0 refills | Status: DC | PRN
Start: 2022-12-03 — End: 2023-01-24
  Filled 2022-12-03: qty 30, 30d supply, fill #0

## 2022-12-03 MED ORDER — QUETIAPINE FUMARATE ER 300 MG PO TB24
300.0000 mg | ORAL_TABLET | Freq: Every day | ORAL | 0 refills | Status: DC
  Filled 2022-12-03: qty 30, 30d supply, fill #0

## 2022-12-03 MED ORDER — POTASSIUM CHLORIDE CRYS ER 20 MEQ PO TBCR
40.0000 meq | EXTENDED_RELEASE_TABLET | Freq: Once | ORAL | Status: AC
  Administered 2022-12-03: 40 meq via ORAL
  Filled 2022-12-03: qty 2

## 2022-12-03 MED ORDER — ALBUTEROL SULFATE HFA 108 (90 BASE) MCG/ACT IN AERS
1.0000 | INHALATION_SPRAY | Freq: Four times a day (QID) | RESPIRATORY_TRACT | 0 refills | Status: DC | PRN
Start: 2022-12-03 — End: 2022-12-20
  Filled 2022-12-03: qty 18, 50d supply, fill #0

## 2022-12-03 MED ORDER — GABAPENTIN 100 MG PO CAPS
200.0000 mg | ORAL_CAPSULE | Freq: Two times a day (BID) | ORAL | 0 refills | Status: DC
  Filled 2022-12-03: qty 120, 30d supply, fill #0

## 2022-12-03 MED ORDER — MIRTAZAPINE 15 MG PO TABS
15.0000 mg | ORAL_TABLET | Freq: Every day | ORAL | 0 refills | Status: DC
  Filled 2022-12-03: qty 30, 30d supply, fill #0

## 2022-12-03 MED ORDER — BUSPIRONE HCL 5 MG PO TABS
5.0000 mg | ORAL_TABLET | Freq: Two times a day (BID) | ORAL | 0 refills | Status: DC
  Filled 2022-12-03: qty 60, 30d supply, fill #0

## 2022-12-03 MED ORDER — BIKTARVY 50-200-25 MG PO TABS
1.0000 | ORAL_TABLET | Freq: Every day | ORAL | 3 refills | Status: DC
  Filled 2022-12-03: qty 30, 30d supply, fill #0

## 2022-12-03 NOTE — Discharge Instructions (Addendum)
John [name],  You were recently admitted to [facility] for ileus.   Continue taking your home medications with the following changes  Continue taking Albuterol 1-2 puffs as needed  Biktarvy 50-200-25 mg daily  Buspar 5mg  twice daily  Gabapentin 200mg  twice daily  Mirtazapine 15mg  nightly  Quetiapine 300mg  nightly  Trazodone 50mg  nightly as needed  Please follow up with infectious disease (Dr. Carver Fila) on October 1st at 1:45pm. If you cannot make this appointment, call [phone] to reschedule [address] [place], [address] [place] [address] [phone]. Please follow up with [facility])   For walk in therapy arrive at [facility] on Monday, Wednesday, Thursday and the 1st, 2nd and 3rd Friday of the month   For medication management arrive at 7AM on Monday-Friday    [address] [place], [place] [address] [phone]) [phone] 4. If at anytime you feel like harming yourself or others, please call [phone] or go to [facility] or an emergency room.    You should seek further medical care if you have severe abdominal pain, cannot pass stool or gas, frequent vomiting or diarrhea, or have thoughts of harming yourself or others.  We recommend that you see your primary care doctor in about a week to make sure that you continue to improve. We are so glad that you are feeling better.  Sincerely, Kristie Cowman

## 2022-12-03 NOTE — Discharge Summary (Addendum)
Name: John [name] MRN: [id number] DOB: 03-Feb-1971 52 y.o. PCP: Pcp, No  Date of Admission: 12/02/2022  9:33 AM Date of Discharge: 12/03/2022 Attending Physician: Dr. Lafonda Mosses  Discharge Diagnosis: Principal Problem:   Partial bowel obstruction [facility]) Active Problems:   Bipolar disorder, mixed (HCC)   Ileus (HCC)    Discharge Medications: Allergies as of 12/03/2022       Reactions   Shellfish Allergy Anaphylaxis, Swelling        Medication List     TAKE these medications    albuterol 108 (90 Base) MCG/ACT inhaler Commonly known as: VENTOLIN HFA Inhale 1-2 puffs into the lungs every 6 (six) hours as needed for wheezing or shortness of breath.   Biktarvy 50-200-25 MG Tabs tablet Generic drug: bictegravir-emtricitabine-tenofovir AF Take 1 tablet by mouth daily.   busPIRone 5 MG tablet Commonly known as: BUSPAR Take 1 tablet (5 mg total) by mouth 2 (two) times daily.   gabapentin 100 MG capsule Commonly known as: NEURONTIN Take 2 capsules (200 mg total) by mouth 2 (two) times daily.   mirtazapine 15 MG tablet Commonly known as: REMERON Take 1 tablet (15 mg total) by mouth at bedtime.   QUEtiapine 300 MG 24 hr tablet Commonly known as: SEROQUEL XR Take 1 tablet (300 mg total) by mouth at bedtime.   traZODone 50 MG tablet Commonly known as: DESYREL Take 1 tablet (50 mg total) by mouth at bedtime as needed for sleep (sleep difficulty).        Disposition and follow-up:   Mr.[name] was discharged from [facility] in Good condition.  At the hospital follow up visit please address:  1.  Follow-up:  a. Patient has follow-up with infectious disease. Please ensure that he attended appointment or rescheduled for a better time    b. Assess patients mental health. Check in to see if he has attended therapy and medication management with [facility].    c. Ensure he is adhering to his medications    2.  Labs / imaging needed at time of  follow-up: none  3.  Pending labs/ test needing follow-up: CD4 count and HIV viral load  4.  Medication Changes  None  Follow-up Appointments:  Follow-up Information     [facility] Go to.   Why: walk in hours which are Monday, Wednesday Thursday morning & every first, second & third Friday afternoon. for THERAPY we suggest patients arrive by 7 am as we work off 1st come 1st serve with limited slot Contact information: [email or web address] [address], [place], [place] [address]   [phone]        Grayce Sessions, NP Follow up.   Specialty: Internal Medicine Why: December 17, 2022 at 1035 am Contact information: [address] [place] [address] [phone]               Dr. Carver Fila with Infectious Disease on October 1st @ 1:45PM  Hospital Course by problem list: [name] is a 52 y.o. with a past medical history of HIV, chronic suicidal ideation, asthma, schizoaffective disorder, and chronic substance use presenting for abdominal pain and admitted for ileus on hospital day 0   #Ileus  Patient presented to the [facility] from [facility] for 1 day of progressively worsening abdominal pain and constipation that progressed to diarrhea. He was initially tachycardic on arrival, but his vital signs were stable once his pain was controlled. K (9/18) 3.0, today 3.5. CT abdomen and pelvis with contrast  revealed dilated fluid filled loops of small bowel, distended right colon with air fluid levels and prominent left sided colonic stool burden. CT was concerning for developing partial obstruction vs ileus. General surgery was consulted and thought that it was most consistent with an ileus. Recommended NPO and bowel rest. Possible etiology of ileus is hypokalemia vs anticholinergic effects of medication. Hypokalemia has resolved as of today, but was present yesterday when symptoms began. Both trazodone and quetiapine have anticholinergic effects which  may contribute to ileus as can cause decreased gastric motility. Patient was NPO overnight and was able to tolerate a coffee in the morning. A trial of food was conducted which he tolerated well. His pain was well controlled on Tylenol. He was counseled to avoid heavy and greasy meals for the next week to allow his bowels to fully recover.    #Hypokalemia K 3.2 on 9/20. Repleted with of K orally.    #HIV  Patient has a history of HIV. Last CD4 count 525 (05/04/22) and HIV 1 RNA 1,480 copies (04/20/22). His home regimen is Biktarvy 50-200-25mg  one tablet daily which was continued. On 12/03/22 his CD4 count and viral load were drawn, but pending at discharge. He has a follow-up appointment with Dr. Carver Fila in infectious disease on October 1st @ 1:45pm.  #Schizoaffective, bipolar type  #Generalized anxiety  Medication prescribed by [facility] is quetiapine, mirtazapine, trazodone, Buspar and gabapentin. Due to presentation of ileus, quetiapine and trazodone were held due to anticholinergic effects that can decrease gastric motility. Buspar, mirtazapine and gabapentin were continued. On discharge, all medications were resumed. He was given discharge instructions on how to follow-up as a walk-in at [facility].   #Asthma  Home medication is albuterol as needed which was continued. He did not require albuterol during this admission.    #Alcohol Use  Patient has a history of alcohol use. He denied alcohol use today. Per labs < 10 mg/dL of alcohol (2/13). During admission his CIWA score was 0. He received thiamine, folic acid and a multivitamin.   #Chronic polysubstance use  Patient has a history of cocaine and THC use. UDS positive for cocaine and THC. TOC was consulted to discuss substance use.      Discharge Subjective: He is doing better this morning. He was able to tolerate scrambled eggs, bacon, muffin, potatoes and a coffee. He was satisfied with his meal and ordered a biscuit with sausage. He continues to  have some diarrhea that is non-bloody. He denies nausea and vomiting.  He did endorse passive SI without a plan which is at his longstanding baseline. He was instructed to seek help if he had active SI or HI.   Discharge Exam:   BP 123/82   Pulse (!) 58   Temp 97.7 F (36.5 C) (Oral)   Resp 18   Ht 5\' 9"  (1.753 m)   Wt 72.6 kg   SpO2 100%   BMI 23.63 kg/m  Constitutional: well-appearing sitting in bed, in no acute distress HENT: normocephalic atraumatic, mucous membranes moist Eyes: conjunctiva non-erythematous Cardiovascular: regular rate and rhythm, no m/r/g Pulmonary/Chest: normal work of breathing on room air, lungs clear to auscultation bilaterally Abdominal: soft, non-distended, mildly tender in LLQ to palpation. Active bowel sounds Skin: warm and dry Psych: pleasant mood   Pertinent Labs, Studies, and Procedures:     Latest Ref Rng & Units 12/03/2022   11:15 AM 12/02/2022   10:20 AM 12/01/2022    2:15 PM  CBC  WBC 4.0 -  10.5 K/uL 3.5  8.1  3.7   Hemoglobin 13.0 - 17.0 g/dL 42.5  95.6  38.7   Hematocrit 39.0 - 52.0 % 38.1  38.4  41.1   Platelets 150 - 400 K/uL 388  383  418        Latest Ref Rng & Units 12/03/2022   11:15 AM 12/02/2022   10:20 AM 12/01/2022    2:15 PM  CMP  Glucose 70 - 99 mg/dL 87  564  70   BUN 6 - 20 mg/dL 5  12  10    Creatinine 0.61 - 1.24 mg/dL 3.32  9.51  8.84   Sodium 135 - 145 mmol/L 139  139  143   Potassium 3.5 - 5.1 mmol/L 3.2  3.5  3.0   Chloride 98 - 111 mmol/L 108  108  101   CO2 22 - 32 mmol/L 23  22  27    Calcium 8.9 - 10.3 mg/dL 7.9  8.5  8.8   Total Protein 6.5 - 8.1 g/dL  6.4  6.9   Total Bilirubin 0.3 - 1.2 mg/dL  0.7  0.4   Alkaline Phos 38 - 126 U/L  64  69   AST 15 - 41 U/L  24  24   ALT 0 - 44 U/L  21  24     CT ABDOMEN PELVIS W CONTRAST  Result Date: 12/02/2022 CLINICAL DATA:  Nonlocalized abdominal pain. History of previous trauma with splenic injury. EXAM: CT ABDOMEN AND PELVIS WITH CONTRAST TECHNIQUE:  Multidetector CT imaging of the abdomen and pelvis was performed using the standard protocol following bolus administration of intravenous contrast. RADIATION DOSE REDUCTION: This exam was performed according to the departmental dose-optimization program which includes automated exposure control, adjustment of the mA and/or kV according to patient size and/or use of iterative reconstruction technique. CONTRAST:  75mL OMNIPAQUE IOHEXOL 350 MG/ML SOLN COMPARISON:  CT scan 12/01/2021 FINDINGS: Lower chest: There is some linear opacity lung bases likely scar or atelectasis. No pleural effusion. Small hiatal hernia. Hepatobiliary: No space-occupying liver lesion. Patent portal vein. Slight intrahepatic biliary duct ectasia, unchanged from previous. Gallbladder is nondilated. Pancreas: Unremarkable. No pancreatic ductal dilatation or surrounding inflammatory changes. Spleen: Progressive healing of the superior splenic injury. Small low-density cleft in this location. Separate small anterior splenule. The spleen itself is nonenlarged and has preserved enhancement otherwise. Adrenals/Urinary Tract: Adrenal glands are preserved. No enhancing renal mass or collecting system dilatation. Ureters have normal course and caliber down to the bladder. Preserved contours of the urinary bladder. Stomach/Bowel: Stomach is distended with fluid and debris. Large bowel has significant colonic stool particularly along the left side. There are areas of fluid and air along the distended right side of the colon. Normal appendix in the right lower quadrant. There is also several fluid-filled dilated loops of small bowel diffusely measuring up to 4.0 cm. Subtle transition suggested in the right hemipelvis along the distal ileum but this has a relatively slow transition. No pneumatosis or free air Vascular/Lymphatic: No significant vascular findings are present. No enlarged abdominal or pelvic lymph nodes. Reproductive: Prostate is unremarkable.  Other: Anasarca. No free air or rim enhancing fluid collections. Small fat containing inguinal hernias. Musculoskeletal: Degenerative changes seen of the spine and pelvis. Some disc bulging seen along the lower lumbar levels. There are bridging osteophytes along the sacroiliac joints on the right IMPRESSION: Dilated fluid-filled loops of small bowel diffusely. There is also distended right side of the colon with air-fluid levels. Prominent left-sided  colonic stool particularly towards the rectum. There are some additional non dilated loops in the right lower quadrant but no clear abrupt transition. Although a developing partial obstruction is in the differential this very well could be an ileus. Please correlate with clinical presentation and short follow-up. Increasing anasarca and some haziness to the mesentery. No obvious free air. Maturing, healing changes of the spleen in the area of previous injury. Stable mild intrahepatic biliary duct ectasia. Gallbladder is contracted. Critical Value/emergent results were called by telephone at the time of interpretation on 12/02/2022 at 10:30 am to provider Dr. Posey Rea, Who verbally acknowledged these results. Electronically Signed   By: Karen Kays M.D.   On: 12/02/2022 13:41     Discharge Instructions: Discharge Instructions     Call MD for:  persistant dizziness or light-headedness   Complete by: As directed    Call MD for:  persistant nausea and vomiting   Complete by: As directed    Call MD for:  severe uncontrolled pain   Complete by: As directed    Diet general   Complete by: As directed    Increase activity slowly   Complete by: As directed      Tanna Furry,  You were recently admitted to [facility] for ileus.   Continue taking your home medications with the following changes  Continue taking Albuterol 1-2 puffs as needed  Biktarvy 50-200-25 mg daily  Buspar 5mg  twice daily  Gabapentin 200mg  twice daily  Mirtazapine 15mg   nightly  Quetiapine 300mg  nightly  Trazodone 50mg  nightly as needed  Please follow up with infectious disease (Dr. Carver Fila) on October 1st at 1:45pm. If you cannot make this appointment, call [phone] to reschedule [address] [place], [address] [place] [address] [phone]. Please follow up with [facility])   For walk in therapy arrive at [facility] on Monday, Wednesday, Thursday and the 1st, 2nd and 3rd Friday of the month   For medication management arrive at 7AM on Monday-Friday    [address] [place], [place] [address] [phone]. If at anytime you feel like harming yourself or others, please call [phone] or go to [facility] or an emergency room.   You should seek further medical care if you have severe abdominal pain, cannot pass stool or gas, frequent vomiting or diarrhea, or have thoughts of harming yourself or others.  We recommend that you see your primary care doctor in about a week to make sure that you continue to improve. We are so glad that you are feeling better.  Sincerely, Kristie Cowman   Signed: Kristie Cowman, MS3 Pager# [phone] 12/03/2022, 2:04 PM   Please contact the on call pager after 5 pm and on weekends at [phone].   Attestation for Student Documentation:  I personally was present and re-performed the history, physical exam and medical decision-making activities of this service and have verified that the service and findings are accurately documented in the student's note.  Katheran James, DO 12/03/2022, 2:06 PM

## 2022-12-03 NOTE — Progress Notes (Signed)
Patient refused to continue his IV fluid.He said he wants to eat.No abdominal pain, no nausea.(+) flatus and had a bowel movement this morning.

## 2022-12-03 NOTE — Progress Notes (Signed)
Tanna Furry to be D/C'd  per MD order.  Discussed with the patient and all questions fully answered.  VSS, Skin clean, dry and intact without evidence of skin break down, no evidence of skin tears noted.  IV catheter discontinued intact. Site without signs and symptoms of complications. Dressing and pressure applied.  An After Visit Summary was printed and given to the patient. Patient received prescription from [facility] pharmacy.  D/c education completed with patient/family including follow up instructions, medication list, d/c activities limitations if indicated, with other d/c instructions as indicated by MD - patient able to verbalize understanding, all questions fully answered.   Patient instructed to return to ED, call 911, or call MD for any changes in condition.   Patient receive bus pass and waiver in chart. Pt wheeled down in wheelchair.

## 2022-12-03 NOTE — Hospital Course (Addendum)
John [name] is a 52 y.o. with a past medical history of HIV, chronic suicidal ideation, asthma, schizoaffective disorder, and chronic substance use presenting for abdominal pain and admitted for ileus on hospital day 0   #Ileus  [facility] for 1 day of progressively worsening abdominal pain and constipation that progressed to diarrhea. He was initially tachycardic on arrival, but his vital signs were stable once his pain was controlled. K (9/18) 3.0, today 3.5. CT abdomen and pelvis with contrast revealed dilated fluid filled loops of small bowel, distended right colon with air fluid levels and prominent left sided colonic stool burden. CT was concerning for developing partial obstruction vs ileus. General surgery was consulted and thought that it was most consistent with an ileus. Recommended NPO and bowel rest. Possible etiology of ileus is hypokalemia vs anticholinergic effects of medication. Hypokalemia has resolved as of today, but was present yesterday when symptoms began. Both trazodone and quetiapine have anticholinergic effects which may contribute to ileus as can cause decreased gastric motility. Patient was NPO overnight and was able to tolerate a coffee in the morning. A trial of food was conducted which he tolerated well. His pain was well controlled on Tylenol. He was counseled to avoid heavy and greasy meals for the next week to allow his bowels to fully recover.    #Hypokalemia K 3.2 on 9/20. Repleted with of K orally.    #HIV  Patient has a history of HIV. Last CD4 count 525 (05/04/22) and HIV 1 RNA 1,480 copies (04/20/22). His home regimen is Biktarvy 50-200-25mg  one tablet daily which was continued. On 12/03/22 his CD4 count and viral load were drawn, but pending at discharge. He has a follow-up appointment with Dr. Carver Fila in infectious disease on October 1st @ 1:45pm.  #Schizoaffective, bipolar type  #Generalized anxiety  Medication prescribed by  [facility] is quetiapine, mirtazapine, trazodone, Buspar and gabapentin. Due to presentation of ileus, quetiapine and trazodone were held due to anticholinergic effects that can decrease gastric motility. Buspar, mirtazapine and gabapentin were continued. On discharge, all medications were resumed. He was given discharge instructions on how to follow-up as a walk-in at [facility].   #Asthma  Home medication is albuterol as needed which was continued. He did not require albuterol during this admission.    #Alcohol Use  Patient has a history of alcohol use. He denied alcohol use today. Per labs < 10 mg/dL of alcohol (3/32). During admission his CIWA score was 0. He received thiamine, folic acid and a multivitamin.   #Chronic polysubstance use  Patient has a history of cocaine and THC use. UDS positive for cocaine and THC. TOC was consulted to discuss substance use.

## 2022-12-03 NOTE — Plan of Care (Signed)

## 2022-12-03 NOTE — Progress Notes (Signed)
   12/03/22 1210  SDOH Interventions  Housing Interventions Inpatient TOC;Other (Comment) (Appropriate referrals and resources provided.)   CSW acknowledges consult and SDOH needs for pt. CSW met with pt at bedside to discuss. CSW asked about a PCP and pt notes he would like one set up. CSW advised of [facility] walk in times per Psych. CSW provided resources for housing, food, and substance use. Pt drowsy and notes he will look at them later. CSW advised that all of this info will be on his AVS at discharge as well. TOC is available for any further needs.

## 2022-12-04 LAB — HIV-1 RNA QUANT-NO REFLEX-BLD
HIV 1 RNA Quant: 130 copies/mL
LOG10 HIV-1 RNA: 2.114 log10copy/mL

## 2022-12-06 LAB — T-HELPER CELLS (CD4) COUNT (NOT AT ARMC)

## 2022-12-14 ENCOUNTER — Ambulatory Visit: Payer: MEDICAID | Admitting: Family

## 2022-12-17 ENCOUNTER — Emergency Department (HOSPITAL_COMMUNITY): Payer: MEDICAID

## 2022-12-17 ENCOUNTER — Inpatient Hospital Stay (INDEPENDENT_AMBULATORY_CARE_PROVIDER_SITE_OTHER): Payer: MEDICAID | Admitting: Primary Care

## 2022-12-17 ENCOUNTER — Inpatient Hospital Stay (HOSPITAL_COMMUNITY)
Admission: EM | Admit: 2022-12-17 | Discharge: 2022-12-20 | DRG: 178 | Disposition: A | Discharge status: Home / Self Care | Payer: MEDICAID | Attending: Internal Medicine | Admitting: Internal Medicine

## 2022-12-17 ENCOUNTER — Other Ambulatory Visit: Payer: Self-pay

## 2022-12-17 ENCOUNTER — Encounter (HOSPITAL_COMMUNITY): Payer: Self-pay

## 2022-12-17 DIAGNOSIS — J188 Other pneumonia, unspecified organism: Secondary | ICD-10-CM | POA: Diagnosis present

## 2022-12-17 DIAGNOSIS — T50996A Underdosing of other drugs, medicaments and biological substances, initial encounter: Secondary | ICD-10-CM | POA: Diagnosis present

## 2022-12-17 DIAGNOSIS — F1020 Alcohol dependence, uncomplicated: Secondary | ICD-10-CM | POA: Diagnosis present

## 2022-12-17 DIAGNOSIS — Z7952 Long term (current) use of systemic steroids: Secondary | ICD-10-CM

## 2022-12-17 DIAGNOSIS — J690 Pneumonitis due to inhalation of food and vomit: Principal | ICD-10-CM | POA: Diagnosis present

## 2022-12-17 DIAGNOSIS — F250 Schizoaffective disorder, bipolar type: Secondary | ICD-10-CM

## 2022-12-17 DIAGNOSIS — F319 Bipolar disorder, unspecified: Secondary | ICD-10-CM | POA: Diagnosis present

## 2022-12-17 DIAGNOSIS — J189 Pneumonia, unspecified organism: Principal | ICD-10-CM | POA: Diagnosis present

## 2022-12-17 DIAGNOSIS — F259 Schizoaffective disorder, unspecified: Secondary | ICD-10-CM | POA: Diagnosis present

## 2022-12-17 DIAGNOSIS — Z79899 Other long term (current) drug therapy: Secondary | ICD-10-CM

## 2022-12-17 DIAGNOSIS — J45901 Unspecified asthma with (acute) exacerbation: Secondary | ICD-10-CM | POA: Diagnosis present

## 2022-12-17 DIAGNOSIS — Z91013 Allergy to seafood: Secondary | ICD-10-CM

## 2022-12-17 DIAGNOSIS — Z91148 Patient's other noncompliance with medication regimen for other reason: Secondary | ICD-10-CM

## 2022-12-17 DIAGNOSIS — Z91138 Patient's unintentional underdosing of medication regimen for other reason: Secondary | ICD-10-CM

## 2022-12-17 DIAGNOSIS — Z21 Asymptomatic human immunodeficiency virus [HIV] infection status: Secondary | ICD-10-CM | POA: Diagnosis present

## 2022-12-17 DIAGNOSIS — Z1152 Encounter for screening for COVID-19: Secondary | ICD-10-CM

## 2022-12-17 DIAGNOSIS — I10 Essential (primary) hypertension: Secondary | ICD-10-CM | POA: Diagnosis present

## 2022-12-17 DIAGNOSIS — E876 Hypokalemia: Secondary | ICD-10-CM | POA: Diagnosis present

## 2022-12-17 DIAGNOSIS — F1410 Cocaine abuse, uncomplicated: Secondary | ICD-10-CM | POA: Diagnosis present

## 2022-12-17 DIAGNOSIS — Z87892 Personal history of anaphylaxis: Secondary | ICD-10-CM

## 2022-12-17 DIAGNOSIS — F1210 Cannabis abuse, uncomplicated: Secondary | ICD-10-CM | POA: Diagnosis present

## 2022-12-17 DIAGNOSIS — G4700 Insomnia, unspecified: Secondary | ICD-10-CM | POA: Diagnosis present

## 2022-12-17 DIAGNOSIS — Z5902 Unsheltered homelessness: Secondary | ICD-10-CM

## 2022-12-17 LAB — CBC WITH DIFFERENTIAL/PLATELET
Abs Immature Granulocytes: 0.03 10*3/uL (ref 0.00–0.07)
Basophils Absolute: 0 10*3/uL (ref 0.0–0.1)
Basophils Relative: 1 %
Eosinophils Absolute: 0.1 10*3/uL (ref 0.0–0.5)
Eosinophils Relative: 1 %
HCT: 41.1 % (ref 39.0–52.0)
Hemoglobin: 13.2 g/dL (ref 13.0–17.0)
Immature Granulocytes: 1 %
Lymphocytes Relative: 27 %
Lymphs Abs: 1.7 10*3/uL (ref 0.7–4.0)
MCH: 30.3 pg (ref 26.0–34.0)
MCHC: 32.1 g/dL (ref 30.0–36.0)
MCV: 94.5 fL (ref 80.0–100.0)
Monocytes Absolute: 0.6 10*3/uL (ref 0.1–1.0)
Monocytes Relative: 9 %
Neutro Abs: 4.1 10*3/uL (ref 1.7–7.7)
Neutrophils Relative %: 61 %
Platelets: 468 10*3/uL — ABNORMAL HIGH (ref 150–400)
RBC: 4.35 MIL/uL (ref 4.22–5.81)
RDW: 13.2 % (ref 11.5–15.5)
WBC: 6.5 10*3/uL (ref 4.0–10.5)
nRBC: 0 % (ref 0.0–0.2)

## 2022-12-17 LAB — PROTIME-INR
INR: 1.1 (ref 0.8–1.2)
Prothrombin Time: 14.1 s (ref 11.4–15.2)

## 2022-12-17 LAB — COMPREHENSIVE METABOLIC PANEL
ALT: 15 U/L (ref 0–44)
AST: 21 U/L (ref 15–41)
Albumin: 2.9 g/dL — ABNORMAL LOW (ref 3.5–5.0)
Alkaline Phosphatase: 72 U/L (ref 38–126)
Anion gap: 9 (ref 5–15)
BUN: 11 mg/dL (ref 6–20)
CO2: 25 mmol/L (ref 22–32)
Calcium: 8.3 mg/dL — ABNORMAL LOW (ref 8.9–10.3)
Chloride: 103 mmol/L (ref 98–111)
Creatinine, Ser: 1.07 mg/dL (ref 0.61–1.24)
GFR, Estimated: >60 mL/min (ref >60)
Glucose, Bld: 106 mg/dL — ABNORMAL HIGH (ref 70–99)
Potassium: 4.6 mmol/L (ref 3.5–5.1)
Sodium: 137 mmol/L (ref 135–145)
Total Bilirubin: 0.9 mg/dL (ref 0.3–1.2)
Total Protein: 6.5 g/dL (ref 6.5–8.1)

## 2022-12-17 LAB — I-STAT CG4 LACTIC ACID, ED: Lactic Acid, Venous: 1.4 mmol/L (ref 0.5–1.9)

## 2022-12-17 LAB — RESP PANEL BY RT-PCR (RSV, FLU A&B, COVID)  RVPGX2
Influenza A by PCR: NEGATIVE
Influenza B by PCR: NEGATIVE
Resp Syncytial Virus by PCR: NEGATIVE
SARS Coronavirus 2 by RT PCR: NEGATIVE

## 2022-12-17 MED ORDER — AZITHROMYCIN 250 MG PO TABS: 500.0000 mg | ORAL_TABLET | Freq: Every day | ORAL | Status: DC

## 2022-12-17 MED ORDER — QUETIAPINE FUMARATE ER 300 MG PO TB24
300.0000 mg | ORAL_TABLET | Freq: Every day | ORAL | Status: DC
  Administered 2022-12-17 – 2022-12-19 (×3): 300 mg via ORAL
  Filled 2022-12-17 (×4): qty 1

## 2022-12-17 MED ORDER — GUAIFENESIN ER 600 MG PO TB12: 600.0000 mg | ORAL_TABLET | Freq: Two times a day (BID) | ORAL | 0 refills | Status: DC

## 2022-12-17 MED ORDER — BUSPIRONE HCL 5 MG PO TABS
5.0000 mg | ORAL_TABLET | Freq: Two times a day (BID) | ORAL | Status: DC
  Administered 2022-12-17 – 2022-12-20 (×6): 5 mg via ORAL
  Filled 2022-12-17 (×6): qty 1

## 2022-12-17 MED ORDER — SULFAMETHOXAZOLE-TRIMETHOPRIM 800-160 MG PO TABS
3.0000 | ORAL_TABLET | Freq: Once | ORAL | Status: AC
  Administered 2022-12-17: 3 via ORAL
  Filled 2022-12-17: qty 3

## 2022-12-17 MED ORDER — ENOXAPARIN SODIUM 40 MG/0.4ML IJ SOSY
40.0000 mg | PREFILLED_SYRINGE | INTRAMUSCULAR | Status: DC
  Administered 2022-12-17 – 2022-12-19 (×3): 40 mg via SUBCUTANEOUS
  Filled 2022-12-17 (×3): qty 0.4

## 2022-12-17 MED ORDER — IPRATROPIUM-ALBUTEROL 0.5-2.5 (3) MG/3ML IN SOLN
3.0000 mL | Freq: Once | RESPIRATORY_TRACT | Status: AC
  Administered 2022-12-17: 3 mL via RESPIRATORY_TRACT
  Filled 2022-12-17: qty 3

## 2022-12-17 MED ORDER — SENNOSIDES-DOCUSATE SODIUM 8.6-50 MG PO TABS: 1.0000 | ORAL_TABLET | Freq: Every evening | ORAL | Status: DC | PRN

## 2022-12-17 MED ORDER — ACETAMINOPHEN 650 MG RE SUPP: 650.0000 mg | Freq: Four times a day (QID) | RECTAL | Status: DC | PRN

## 2022-12-17 MED ORDER — MIRTAZAPINE 15 MG PO TABS
15.0000 mg | ORAL_TABLET | Freq: Every day | ORAL | Status: DC
  Administered 2022-12-17 – 2022-12-19 (×3): 15 mg via ORAL
  Filled 2022-12-17 (×3): qty 1

## 2022-12-17 MED ORDER — BICTEGRAVIR-EMTRICITAB-TENOFOV 50-200-25 MG PO TABS
1.0000 | ORAL_TABLET | Freq: Every day | ORAL | Status: DC
  Administered 2022-12-18 – 2022-12-20 (×3): 1 via ORAL
  Filled 2022-12-17 (×5): qty 1

## 2022-12-17 MED ORDER — BIKTARVY 50-200-25 MG PO TABS: 1.0000 | ORAL_TABLET | Freq: Every day | ORAL | 0 refills | Status: DC

## 2022-12-17 MED ORDER — SULFAMETHOXAZOLE-TRIMETHOPRIM 800-160 MG PO TABS: 3.0000 | ORAL_TABLET | Freq: Three times a day (TID) | ORAL | 0 refills | Status: DC

## 2022-12-17 MED ORDER — PREDNISONE 20 MG PO TABS
40.0000 mg | ORAL_TABLET | Freq: Every day | ORAL | 0 refills | Status: DC
Start: 2022-12-17 — End: 2022-12-17

## 2022-12-17 MED ORDER — ACETAMINOPHEN 325 MG PO TABS
650.0000 mg | ORAL_TABLET | Freq: Four times a day (QID) | ORAL | Status: DC | PRN
  Administered 2022-12-18: 650 mg via ORAL
  Filled 2022-12-17: qty 2

## 2022-12-17 MED ORDER — AZITHROMYCIN 250 MG PO TABS
500.0000 mg | ORAL_TABLET | Freq: Every day | ORAL | Status: DC
  Administered 2022-12-18 – 2022-12-20 (×3): 500 mg via ORAL
  Filled 2022-12-17 (×3): qty 2

## 2022-12-17 MED ORDER — PREDNISONE 20 MG PO TABS
60.0000 mg | ORAL_TABLET | Freq: Once | ORAL | Status: AC
  Administered 2022-12-17: 60 mg via ORAL
  Filled 2022-12-17: qty 3

## 2022-12-17 NOTE — ED Provider Notes (Signed)
Patient signed out to [employer], [employer].  Please review their note for the continuation of patient's care.  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  Consults: Sherrilee Gilles, MD Hospitalist  I spoke to the hospitalist and patient was accepted for admission.  Patient stable for admission.    Netta Corrigan, PA-C 12/17/22 1630    Theresia Lo [place], [place] 12/18/22 [phone]
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
## 2022-12-17 NOTE — ED Provider Notes (Signed)
[place] EMERGENCY DEPARTMENT AT [facility] Provider Note   CSN: [id number] Arrival date & time: 12/17/22  1219     History  Chief Complaint  Patient presents with   Shortness of Breath    John [name] is a 52 y.o. male, history of HIV, who presents to the ED secondary to shortness of breath, productive cough, and fever and chills for the last few weeks.  He states he just felt unwell for the last couple weeks, and has been having fevers, chills, shortness of breath, and cough.  Denies any chest pain, nausea, vomiting.  Has been out of his HIV meds, for the last month and a half, as if they were taken from him.  Denies any sick contacts.     Home Medications Prior to Admission medications   Medication Sig Start Date End Date Taking? Authorizing Provider  albuterol (VENTOLIN HFA) 108 (90 Base) MCG/ACT inhaler Inhale 1-2 puffs into the lungs every 6 (six) hours as needed for wheezing or shortness of breath. 12/03/22   Rocky Morel, DO  bictegravir-emtricitabine-tenofovir AF (BIKTARVY) 50-200-25 MG TABS tablet Take 1 tablet by mouth daily. 12/03/22   Rocky Morel, DO  busPIRone (BUSPAR) 5 MG tablet Take 1 tablet (5 mg total) by mouth 2 (two) times daily. 12/03/22   Rocky Morel, DO  gabapentin (NEURONTIN) 100 MG capsule Take 2 capsules (200 mg total) by mouth 2 (two) times daily. 12/03/22 01/02/23  Rocky Morel, DO  mirtazapine (REMERON) 15 MG tablet Take 1 tablet (15 mg total) by mouth at bedtime. 12/03/22   Rocky Morel, DO  QUEtiapine (SEROQUEL XR) 300 MG 24 hr tablet Take 1 tablet (300 mg total) by mouth at bedtime. 12/03/22   Rocky Morel, DO  traZODone (DESYREL) 50 MG tablet Take 1 tablet (50 mg total) by mouth at bedtime as needed for sleep (sleep difficulty). 12/03/22 04/02/23  Rocky Morel, DO  dicyclomine (BENTYL) 20 MG tablet Take 1 tablet (20 mg total) by mouth 2 (two) times daily. 01/08/17 01/03/20  Palumbo, April, MD  sucralfate (CARAFATE) 1  GM/10ML suspension Take 10 mLs (1 g total) by mouth 4 (four) times daily -  with meals and at bedtime. Patient not taking: Reported on 10/21/2017 01/08/17 09/03/18  Palumbo, April, MD      Allergies    Shellfish allergy    Review of Systems   Review of Systems  Constitutional:  Positive for fatigue and fever.  Respiratory:  Positive for cough and shortness of breath.   Cardiovascular:  Negative for chest pain.    Physical Exam Updated Vital Signs BP (!) 145/85 (BP Location: Right Arm)   Pulse 97   Temp 99.7 F (37.6 C)   Resp 16   Ht 5\' 9"  (1.753 m)   Wt 72.6 kg   SpO2 93%   BMI 23.63 kg/m  Physical Exam Vitals and nursing note reviewed.  Constitutional:      General: He is not in acute distress.    Appearance: He is well-developed.  HENT:     Head: Normocephalic and atraumatic.  Eyes:     Conjunctiva/sclera: Conjunctivae normal.  Cardiovascular:     Rate and Rhythm: Normal rate and regular rhythm.     Heart sounds: No murmur heard. Pulmonary:     Effort: Pulmonary effort is normal. No respiratory distress.     Breath sounds: Wheezing present.     Comments: Diffuse wheezing throughout Abdominal:     Palpations: Abdomen is soft.  Tenderness: There is no abdominal tenderness.  Musculoskeletal:        General: No swelling.     Cervical back: Neck supple.  Skin:    General: Skin is warm and dry.     Capillary Refill: Capillary refill takes less than 2 seconds.  Neurological:     Mental Status: He is alert.  Psychiatric:        Mood and Affect: Mood normal.     ED Results / Procedures / Treatments   Labs (all labs ordered are listed, but only abnormal results are displayed) Labs Reviewed  COMPREHENSIVE METABOLIC PANEL - Abnormal; Notable for the following components:      Result Value   Glucose, Bld 106 (*)    Calcium 8.3 (*)    Albumin 2.9 (*)    All other components within normal limits  CBC WITH DIFFERENTIAL/PLATELET - Abnormal; Notable for the  following components:   Platelets 468 (*)    All other components within normal limits  RESP PANEL BY RT-PCR (RSV, FLU A&B, COVID)  [id number]  CULTURE, BLOOD (ROUTINE X 2)  CULTURE, BLOOD (ROUTINE X 2)  PROTIME-INR  URINALYSIS, W/ REFLEX TO CULTURE (INFECTION SUSPECTED)  T-HELPER CELLS (CD4) COUNT (NOT AT [facility])  I-STAT CG4 LACTIC ACID, ED  I-STAT CG4 LACTIC ACID, ED    EKG EKG Interpretation Date/Time:  Friday December 17 2022 12:00:05 EDT Ventricular Rate:  92 PR Interval:  130 QRS Duration:  84 QT Interval:  346 QTC Calculation: 427 R Axis:   50  Text Interpretation: Normal sinus rhythm Normal ECG No significant change since last tracing Confirmed by Elayne Snare ([id number]) on 12/17/2022 1:50:50 PM  Radiology DG Chest 2 View  Result Date: 12/17/2022 CLINICAL DATA:  Two-week history of worsening productive cough associated with generalized body aches EXAM: CHEST - 2 VIEW COMPARISON:  Chest radiograph dated 09/12/2022 FINDINGS: Normal lung volumes. Multifocal bilateral lower lobe, middle lobe, and lingular patchy opacities. No pleural effusion or pneumothorax. The heart size and mediastinal contours are within normal limits. No acute osseous abnormality. IMPRESSION: Findings suspicious for multifocal pneumonia. Electronically Signed   By: Agustin Cree M.D.   On: 12/17/2022 15:14    Procedures Procedures    Medications Ordered in ED Medications  sulfamethoxazole-trimethoprim (BACTRIM DS) 800-160 MG per tablet 3 tablet (has no administration in time range)  ipratropium-albuterol (DUONEB) 0.5-2.5 (3) MG/3ML nebulizer solution 3 mL (3 mLs Nebulization Given 12/17/22 1512)  predniSONE (DELTASONE) tablet 60 mg (60 mg Oral Given 12/17/22 1512)    ED Course/ Medical Decision Making/ A&P                                 Medical Decision Making Patient is a 52 year old male, here for shortness of breath, cough, for the last 2 weeks.  Has been out of his antivirals for the last 2 months.  He  is unwell appearing, and O2 is 93%, he has diffuse wheezing.  Suspicion for possible PJP, will obtain chest x-ray, blood work, as well as COVID testing.  Will give a DuoNeb, see if any improvement  Amount and/or Complexity of Data Reviewed Labs: ordered.    Details: No leukocytosis, blood work overall reassuring Radiology: ordered.    Details: Chest x-ray shows multifocal pneumonia Discussion of management or test interpretation with external provider(s): Patient no better after DuoNeb, prednisone, still diffusely wheezy, crackles throughout now.  Given HIV, noncompliant with meds, immune  O's compromise, with multifocal pneumonia, believe patient will greatly benefit from admission.  High suspicion for PJP, given first dose of Bactrim.  Signed out to [facility], for follow-up, on admission and admission to hospitalist  Risk Prescription drug management. Decision regarding hospitalization.    Final Clinical Impression(s) / ED Diagnoses Final diagnoses:  Multifocal pneumonia  HIV infection, unspecified symptom status (HCC)    Rx / DC Orders ED Discharge Orders          Ordered    predniSONE (DELTASONE) 20 MG tablet  Daily,   Status:  Discontinued        12/17/22 1527    guaiFENesin (MUCINEX) 600 MG 12 hr tablet  2 times daily,   Status:  Discontinued        12/17/22 1527    sulfamethoxazole-trimethoprim (BACTRIM DS) 800-160 MG tablet  3 times daily,   Status:  Discontinued        12/17/22 1527    bictegravir-emtricitabine-tenofovir AF (BIKTARVY) 50-200-25 MG TABS tablet  Daily,   Status:  Discontinued        12/17/22 1529              Chryl Holten L, PA 12/17/22 1543    Elayne Snare K, DO 12/17/22 1545
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
## 2022-12-18 DIAGNOSIS — F17210 Nicotine dependence, cigarettes, uncomplicated: Secondary | ICD-10-CM | POA: Diagnosis not present

## 2022-12-18 DIAGNOSIS — Z91138 Patient's unintentional underdosing of medication regimen for other reason: Secondary | ICD-10-CM | POA: Diagnosis not present

## 2022-12-18 DIAGNOSIS — J690 Pneumonitis due to inhalation of food and vomit: Secondary | ICD-10-CM | POA: Diagnosis present

## 2022-12-18 DIAGNOSIS — F1020 Alcohol dependence, uncomplicated: Secondary | ICD-10-CM | POA: Diagnosis present

## 2022-12-18 DIAGNOSIS — Z79899 Other long term (current) drug therapy: Secondary | ICD-10-CM | POA: Diagnosis not present

## 2022-12-18 DIAGNOSIS — F1210 Cannabis abuse, uncomplicated: Secondary | ICD-10-CM | POA: Diagnosis present

## 2022-12-18 DIAGNOSIS — I10 Essential (primary) hypertension: Secondary | ICD-10-CM | POA: Diagnosis present

## 2022-12-18 DIAGNOSIS — G4700 Insomnia, unspecified: Secondary | ICD-10-CM | POA: Diagnosis present

## 2022-12-18 DIAGNOSIS — B20 Human immunodeficiency virus [HIV] disease: Secondary | ICD-10-CM | POA: Diagnosis not present

## 2022-12-18 DIAGNOSIS — E876 Hypokalemia: Secondary | ICD-10-CM | POA: Diagnosis present

## 2022-12-18 DIAGNOSIS — Z5902 Unsheltered homelessness: Secondary | ICD-10-CM | POA: Diagnosis not present

## 2022-12-18 DIAGNOSIS — Z91148 Patient's other noncompliance with medication regimen for other reason: Secondary | ICD-10-CM | POA: Diagnosis not present

## 2022-12-18 DIAGNOSIS — F319 Bipolar disorder, unspecified: Secondary | ICD-10-CM | POA: Diagnosis present

## 2022-12-18 DIAGNOSIS — Z7952 Long term (current) use of systemic steroids: Secondary | ICD-10-CM | POA: Diagnosis not present

## 2022-12-18 DIAGNOSIS — T50996A Underdosing of other drugs, medicaments and biological substances, initial encounter: Secondary | ICD-10-CM | POA: Diagnosis present

## 2022-12-18 DIAGNOSIS — Z1152 Encounter for screening for COVID-19: Secondary | ICD-10-CM | POA: Diagnosis not present

## 2022-12-18 DIAGNOSIS — Z87892 Personal history of anaphylaxis: Secondary | ICD-10-CM | POA: Diagnosis not present

## 2022-12-18 DIAGNOSIS — F259 Schizoaffective disorder, unspecified: Secondary | ICD-10-CM | POA: Diagnosis present

## 2022-12-18 DIAGNOSIS — Z21 Asymptomatic human immunodeficiency virus [HIV] infection status: Secondary | ICD-10-CM | POA: Diagnosis present

## 2022-12-18 DIAGNOSIS — F1410 Cocaine abuse, uncomplicated: Secondary | ICD-10-CM | POA: Diagnosis present

## 2022-12-18 DIAGNOSIS — J189 Pneumonia, unspecified organism: Secondary | ICD-10-CM | POA: Diagnosis not present

## 2022-12-18 DIAGNOSIS — J45901 Unspecified asthma with (acute) exacerbation: Secondary | ICD-10-CM | POA: Diagnosis present

## 2022-12-18 DIAGNOSIS — Z91013 Allergy to seafood: Secondary | ICD-10-CM | POA: Diagnosis not present

## 2022-12-18 LAB — URINALYSIS, ROUTINE W REFLEX MICROSCOPIC
Bacteria, UA: NONE SEEN
Bilirubin Urine: NEGATIVE
Glucose, UA: NEGATIVE mg/dL
Hgb urine dipstick: NEGATIVE
Ketones, ur: 5 mg/dL — AB
Leukocytes,Ua: NEGATIVE
Nitrite: NEGATIVE
Protein, ur: 30 mg/dL — AB
Specific Gravity, Urine: 1.033 — ABNORMAL HIGH (ref 1.005–1.030)
pH: 6 (ref 5.0–8.0)

## 2022-12-18 LAB — CBC
HCT: 34.6 % — ABNORMAL LOW (ref 39.0–52.0)
Hemoglobin: 11.7 g/dL — ABNORMAL LOW (ref 13.0–17.0)
MCH: 31 pg (ref 26.0–34.0)
MCHC: 33.8 g/dL (ref 30.0–36.0)
MCV: 91.5 fL (ref 80.0–100.0)
Platelets: 464 10*3/uL — ABNORMAL HIGH (ref 150–400)
RBC: 3.78 MIL/uL — ABNORMAL LOW (ref 4.22–5.81)
RDW: 13.3 % (ref 11.5–15.5)
WBC: 9.2 10*3/uL (ref 4.0–10.5)
nRBC: 0 % (ref 0.0–0.2)

## 2022-12-18 LAB — BASIC METABOLIC PANEL
Anion gap: 8 (ref 5–15)
BUN: 12 mg/dL (ref 6–20)
CO2: 24 mmol/L (ref 22–32)
Calcium: 8 mg/dL — ABNORMAL LOW (ref 8.9–10.3)
Chloride: 107 mmol/L (ref 98–111)
Creatinine, Ser: 0.87 mg/dL (ref 0.61–1.24)
GFR, Estimated: >60 mL/min (ref >60)
Glucose, Bld: 134 mg/dL — ABNORMAL HIGH (ref 70–99)
Potassium: 3 mmol/L — ABNORMAL LOW (ref 3.5–5.1)
Sodium: 139 mmol/L (ref 135–145)

## 2022-12-18 LAB — GLUCOSE, CAPILLARY: Glucose-Capillary: 126 mg/dL — ABNORMAL HIGH (ref 70–99)

## 2022-12-18 MED ORDER — SODIUM CHLORIDE 0.9 % IV SOLN
3.0000 g | Freq: Four times a day (QID) | INTRAVENOUS | Status: DC
  Administered 2022-12-18 – 2022-12-20 (×9): 3 g via INTRAVENOUS
  Filled 2022-12-18 (×9): qty 8

## 2022-12-18 MED ORDER — IPRATROPIUM-ALBUTEROL 0.5-2.5 (3) MG/3ML IN SOLN
3.0000 mL | Freq: Four times a day (QID) | RESPIRATORY_TRACT | Status: DC
  Administered 2022-12-18 – 2022-12-20 (×5): 3 mL via RESPIRATORY_TRACT
  Filled 2022-12-18 (×5): qty 3

## 2022-12-18 MED ORDER — IPRATROPIUM-ALBUTEROL 0.5-2.5 (3) MG/3ML IN SOLN
3.0000 mL | Freq: Four times a day (QID) | RESPIRATORY_TRACT | Status: AC
  Administered 2022-12-18 (×2): 3 mL via RESPIRATORY_TRACT
  Filled 2022-12-18 (×2): qty 3

## 2022-12-18 MED ORDER — POTASSIUM CHLORIDE CRYS ER 20 MEQ PO TBCR
40.0000 meq | EXTENDED_RELEASE_TABLET | Freq: Once | ORAL | Status: AC
  Administered 2022-12-18: 40 meq via ORAL
  Filled 2022-12-18: qty 2

## 2022-12-18 MED ORDER — IBUPROFEN 200 MG PO TABS
400.0000 mg | ORAL_TABLET | Freq: Once | ORAL | Status: AC
  Administered 2022-12-18: 400 mg via ORAL
  Filled 2022-12-18: qty 2

## 2022-12-18 MED ORDER — IPRATROPIUM-ALBUTEROL 0.5-2.5 (3) MG/3ML IN SOLN
3.0000 mL | Freq: Once | RESPIRATORY_TRACT | Status: AC
  Administered 2022-12-18: 3 mL via RESPIRATORY_TRACT
  Filled 2022-12-18: qty 3

## 2022-12-18 MED ORDER — DM-GUAIFENESIN ER 30-600 MG PO TB12
1.0000 | ORAL_TABLET | Freq: Two times a day (BID) | ORAL | Status: DC
  Administered 2022-12-18 – 2022-12-20 (×4): 1 via ORAL
  Filled 2022-12-18 (×5): qty 1

## 2022-12-18 NOTE — Progress Notes (Signed)
Pharmacy Antibiotic Note  John [name] is a 52 y.o. male admitted on 12/17/2022 with pneumonia.  Pharmacy has been consulted for Unasyn dosing. PMH includes HIV, schizoaffective disorder, and substance use disorder.  Patient had recent admit 9/19-20 for ileus. At that time, HIV RNA quant 130, CD4 count unknown. Patient has been out of meds for a couple weeks. Supposed to be on Biktarvy. CD4 count in process  Plan: Unasyn 3 g IV every 6 hours Azithromycin per MD Monitor clinical progress, cultures/sensitivities, renal function, abx plan  Height: 5\' 9"  (175.3 cm) Weight: 72.6 kg (160 lb) IBW/kg (Calculated) : 70.7  Temp (24hrs), Avg:99.1 F (37.3 C), Min:97.5 F (36.4 C), Max:100.5 F (38.1 C)  Recent Labs  Lab 12/17/22 1257 12/17/22 1311  WBC 6.5  --   CREATININE 1.07  --   LATICACIDVEN  --  1.4    Estimated Creatinine Clearance: 81.7 mL/min (by C-G formula based on SCr of 1.07 mg/dL).    Allergies  Allergen Reactions   Shellfish Allergy Anaphylaxis and Swelling    Antimicrobials this admission: 10/5 Unasyn  >>  10/4 Bactrim  x 1  Dose adjustments this admission:  Microbiology results: 10/4 BCx: ng < 24h 10/4 COV/Flu/RSV: negative    Thank you for allowing pharmacy to be a part of this patient's care.   Signe Colt, PharmD 12/18/2022 8:32 AM  **Pharmacist phone directory can be found on [email or web address] listed under [facility]**
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
## 2022-12-18 NOTE — Plan of Care (Signed)

## 2022-12-18 NOTE — Progress Notes (Signed)
Patient acutely dyspneic after getting out of shower. Patient reports being unable to catch his breath, RR in 40s. This RN placed patient on NRB, vitals obtained- see flowsheet. After a few minutes patients RR 20-22. Sent message to team. Patient took off NRB and began eating snacks. Overnight team at bedside to evaluate patient. Orders received for breathing tx. This RN admin meds per [facility]. O2 sats 100% on RA and during breathing treatment.

## 2022-12-18 NOTE — Progress Notes (Addendum)
   I saw patient at bedside to evaluate him.    John [name] is a 52 y.o. male with history of HIV, schizoaffective disorder, and substance use disorder who presents with 2.5-3 weeks of productive cough, SOB, subjective fevers, chills, sweats, and myalgias  and found to have multilobar Pneumonia for which we are actively managing him for.  At bedside ,He seemed to be unease ,hot and tossing in bed. Patient endorsed chills. His vitals, however remained stable. Patient reported that his last " crack" use was the day before this admission. I worry that  he is withdrawing from Cocaine.He is suspected to have Pneumonia likely due to opportunistic infections and he is on bactrim but I think his chronic use of cocaine also puts him at a higher risk for cocaine induced pneumonitis and should be considered as a potential differential. We will continue to monitor his withdrawal symptoms for now. Day team will be briefed.   Addendum At around 5:30 am, Nurse reported "Patient was SOB and struggling to catch his breath" with a RR in the 40s.Patient was given NRB mask at 100% that resolved his distress.RR went down to 21. When patient was seen at bedside , he had  the NRB mask on but took it off after few minutes and said he was fine. On exam he was very wheezy diffusely on lung auscultation. He denied any chest pain. Patint was stable when I left the bedside.   Patient was given one time DUONEB treatment. Will brief the day team .   Kathleen Lime, MD 12/18/2022, 1:04 AM Pager: [phone]

## 2022-12-18 NOTE — Progress Notes (Signed)
HR and RR are elevated, see flowsheets. Patient c/o pain 8/10 with no relief from tylenol. Sent message to team.

## 2022-12-18 NOTE — Progress Notes (Addendum)
Visited pt at bedside for concern of generalized pain, tachycardia, tachypnea. He is here for treatment of multifocal pneumonia. Pt reports feeling worse over the course of this night with fully body pain worse than before in hospitalization. He is ill-appearing, sweaty, tachycardia 120s, tachypnea 32, O2 91 on RA. Temperature 98.1 about 45 minutes ago. His lungs exhibit wheezes and rhonchi. He has audible nasal congestion that limits nasal breathing. Will initiate mucolytics, ibuprofen, duonebs, and supplemental oxygen. Will reevaluate shortly. Will consider transition to progressive unit if his condition does not improve.

## 2022-12-19 DIAGNOSIS — F17210 Nicotine dependence, cigarettes, uncomplicated: Secondary | ICD-10-CM

## 2022-12-19 DIAGNOSIS — B20 Human immunodeficiency virus [HIV] disease: Secondary | ICD-10-CM

## 2022-12-19 DIAGNOSIS — J189 Pneumonia, unspecified organism: Secondary | ICD-10-CM

## 2022-12-19 DIAGNOSIS — J45901 Unspecified asthma with (acute) exacerbation: Secondary | ICD-10-CM | POA: Diagnosis not present

## 2022-12-19 LAB — BASIC METABOLIC PANEL
Anion gap: 13 (ref 5–15)
BUN: 11 mg/dL (ref 6–20)
CO2: 21 mmol/L — ABNORMAL LOW (ref 22–32)
Calcium: 8.4 mg/dL — ABNORMAL LOW (ref 8.9–10.3)
Chloride: 108 mmol/L (ref 98–111)
Creatinine, Ser: 0.97 mg/dL (ref 0.61–1.24)
GFR, Estimated: >60 mL/min (ref >60)
Glucose, Bld: 92 mg/dL (ref 70–99)
Potassium: 3.7 mmol/L (ref 3.5–5.1)
Sodium: 142 mmol/L (ref 135–145)

## 2022-12-19 LAB — CBC
HCT: 35.8 % — ABNORMAL LOW (ref 39.0–52.0)
Hemoglobin: 11.3 g/dL — ABNORMAL LOW (ref 13.0–17.0)
MCH: 29 pg (ref 26.0–34.0)
MCHC: 31.6 g/dL (ref 30.0–36.0)
MCV: 91.8 fL (ref 80.0–100.0)
Platelets: 454 10*3/uL — ABNORMAL HIGH (ref 150–400)
RBC: 3.9 MIL/uL — ABNORMAL LOW (ref 4.22–5.81)
RDW: 13.6 % (ref 11.5–15.5)
WBC: 5.6 10*3/uL (ref 4.0–10.5)
nRBC: 0 % (ref 0.0–0.2)

## 2022-12-19 LAB — MAGNESIUM: Magnesium: 1.8 mg/dL (ref 1.7–2.4)

## 2022-12-19 MED ORDER — IBUPROFEN 200 MG PO TABS: 400.0000 mg | ORAL_TABLET | Freq: Four times a day (QID) | ORAL | Status: DC | PRN

## 2022-12-19 MED ORDER — PREDNISONE 10 MG PO TABS
40.0000 mg | ORAL_TABLET | Freq: Every day | ORAL | Status: DC
  Administered 2022-12-19 – 2022-12-20 (×2): 40 mg via ORAL
  Filled 2022-12-19 (×2): qty 4

## 2022-12-19 NOTE — Progress Notes (Signed)
HD#1 Subjective:   Summary: John [name] is a 52 y.o. male with PMH of HIV, schizoaffective disorder, and substance use disorder, who presented with productive cough, SOB and admitted for multifocal pneumonia.   Overnight Events: 19 paged for generalized pain, tachycardia and tachypnea found to have HR 120, RR 32, 91% on room air with temp of 98.1.  Patient was given mucolytics, ibuprofen, DuoNebs and briefly on 3 L  with noted improvement.  Patient evaluated bedside this morning about to eat breakfast.  States he is feeling better compared to overnight.  Denies any shortness of breath, worsening cough or fever.  Still has productive cough.  Objective:  Vital signs in last 24 hours: Vitals:   12/19/22 0152 12/19/22 0342 12/19/22 0741 12/19/22 0858  BP: 109/68 125/88 109/78   Pulse: (!) 114 (!) 106 88 91  Resp: (!) 24 20 16 18   Temp: 98.9 F (37.2 C) (!) 97.5 F (36.4 C) (!) 97.5 F (36.4 C)   TempSrc: Oral Axillary Oral   SpO2: 96% 97% 99%   Weight:      Height:       Supplemental O2: Room Air SpO2: 97 % O2 Flow Rate (L/min): 3 L/min   Physical Exam:  Constitutional: Alert, sitting up in bed comfortably, in no acute distress Cardiovascular: Regular rate and rhythm Pulmonary/Chest: Normal work of breathing on room air, bilateral basilar crackles with mild wheezing, no signs of respiratory distress Neurological: alert & oriented x 3 Skin: warm and dry  Filed Weights   12/17/22 1223  Weight: 72.6 kg     Intake/Output Summary (Last 24 hours) at 12/19/2022 0621 Last data filed at 12/19/2022 0338 Gross per 24 hour  Intake 900.06 ml  Output 900 ml  Net 0.06 ml   Net IO Since Admission: 0.06 mL [12/19/22 0621]  Pertinent Labs:    Latest Ref Rng & Units 12/18/2022    4:20 PM 12/17/2022   12:57 PM 12/03/2022   11:15 AM  CBC  WBC 4.0 - 10.5 K/uL 9.2  6.5  3.5   Hemoglobin 13.0 - 17.0 g/dL 09.8  11.9  14.7   Hematocrit 39.0 - 52.0 % 34.6  41.1  38.1   Platelets  150 - 400 K/uL 464  468  388        Latest Ref Rng & Units 12/18/2022    4:20 PM 12/17/2022   12:57 PM 12/03/2022   11:15 AM  CMP  Glucose 70 - 99 mg/dL 829  562  87   BUN 6 - 20 mg/dL 12  11  5    Creatinine 0.61 - 1.24 mg/dL 1.30  8.65  7.84   Sodium 135 - 145 mmol/L 139  137  139   Potassium 3.5 - 5.1 mmol/L 3.0  4.6  3.2   Chloride 98 - 111 mmol/L 107  103  108   CO2 22 - 32 mmol/L 24  25  23    Calcium 8.9 - 10.3 mg/dL 8.0  8.3  7.9   Total Protein 6.5 - 8.1 g/dL  6.5    Total Bilirubin 0.3 - 1.2 mg/dL  0.9    Alkaline Phos 38 - 126 U/L  72    AST 15 - 41 U/L  21    ALT 0 - 44 U/L  15      Imaging: No results found.  Assessment/Plan:   Principal Problem:   Multifocal pneumonia   Patient Summary: John [name] is a 52 y.o. with a pertinent  PMH of HIV, schizoaffective disorder, and substance use disorder, who presented with productive cough, SOB and admitted for multifocal pneumonia.   #Multifocal pneumonia #Asthma exacerbation Overnight had episode of tachypnea and tachycardia improved with DuoNeb, mucolytics and ibuprofen.  Saturating well on room air this morning and no signs of respiratory distress.  Will continue with antibiotics to treat CAP vs aspiration and transition to all p.o. tomorrow along with treatment for asthma exacerbation.   -Continue azithromycin and Unasyn (day 2) -Continue DuoNebs Q6H -Prednisone 40 mg daily for total 5 days -Continue tylenol 650 mg Q6 PRN and ibuprofen 400 mg Q6 PRN -Consider repeat CXR if concerns for respiratory distress  #HIV History of medication noncompliance in setting of homelessness and stolen medications. -Continue Biktarvy  -Follow-up CD4 count -If CD4 count less than 200, initiate PJP prophylaxis  #Hypokalemia Repleted potassium yesterday. Mag 1.8. Pending AM lab today.  -BMP, replete as needed  Chronic: Schizoaffective disorder- continue home buspar 5 mg BID, remeron 15 mg and seroquel 300 mg   Diet:  Normal IVF: None,None VTE: Enoxaparin Code: Full TOC recs: consult for resources given unhomed  Dispo: Anticipated discharge to Home in 1-2 days pending medical stability.   Rana Snare, DO Internal Medicine Resident PGY-2 Pager: [phone] Please contact the on call pager after 5 pm and on weekends at [phone].

## 2022-12-20 ENCOUNTER — Other Ambulatory Visit (HOSPITAL_COMMUNITY): Payer: Self-pay

## 2022-12-20 DIAGNOSIS — J189 Pneumonia, unspecified organism: Secondary | ICD-10-CM | POA: Diagnosis not present

## 2022-12-20 DIAGNOSIS — B20 Human immunodeficiency virus [HIV] disease: Secondary | ICD-10-CM | POA: Diagnosis not present

## 2022-12-20 DIAGNOSIS — J45901 Unspecified asthma with (acute) exacerbation: Secondary | ICD-10-CM | POA: Diagnosis not present

## 2022-12-20 LAB — CBC
HCT: 34.4 % — ABNORMAL LOW (ref 39.0–52.0)
Hemoglobin: 11 g/dL — ABNORMAL LOW (ref 13.0–17.0)
MCH: 29.5 pg (ref 26.0–34.0)
MCHC: 32 g/dL (ref 30.0–36.0)
MCV: 92.2 fL (ref 80.0–100.0)
Platelets: 504 10*3/uL — ABNORMAL HIGH (ref 150–400)
RBC: 3.73 MIL/uL — ABNORMAL LOW (ref 4.22–5.81)
RDW: 13.7 % (ref 11.5–15.5)
WBC: 7.8 10*3/uL (ref 4.0–10.5)
nRBC: 0 % (ref 0.0–0.2)

## 2022-12-20 LAB — BASIC METABOLIC PANEL
Anion gap: 12 (ref 5–15)
BUN: 11 mg/dL (ref 6–20)
CO2: 21 mmol/L — ABNORMAL LOW (ref 22–32)
Calcium: 8.3 mg/dL — ABNORMAL LOW (ref 8.9–10.3)
Chloride: 107 mmol/L (ref 98–111)
Creatinine, Ser: 0.82 mg/dL (ref 0.61–1.24)
GFR, Estimated: >60 mL/min (ref >60)
Glucose, Bld: 114 mg/dL — ABNORMAL HIGH (ref 70–99)
Potassium: 3.1 mmol/L — ABNORMAL LOW (ref 3.5–5.1)
Sodium: 140 mmol/L (ref 135–145)

## 2022-12-20 LAB — T-HELPER CELLS (CD4) COUNT (NOT AT ARMC)
CD4 % Helper T Cell: 35 % (ref 33–65)
CD4 T Cell Abs: 451 /uL (ref 400–1790)

## 2022-12-20 MED ORDER — IPRATROPIUM-ALBUTEROL 0.5-2.5 (3) MG/3ML IN SOLN: 3.0000 mL | Freq: Four times a day (QID) | RESPIRATORY_TRACT | Status: DC | PRN

## 2022-12-20 MED ORDER — MIRTAZAPINE 15 MG PO TABS
15.0000 mg | ORAL_TABLET | Freq: Every day | ORAL | 0 refills | Status: DC
Start: 2022-12-20 — End: 2023-01-24
  Filled 2022-12-20 (×2): qty 30, 30d supply, fill #0

## 2022-12-20 MED ORDER — AZITHROMYCIN 500 MG PO TABS
500.0000 mg | ORAL_TABLET | Freq: Every day | ORAL | 0 refills | Status: AC
  Filled 2022-12-20: qty 2, 2d supply, fill #0

## 2022-12-20 MED ORDER — BUDESONIDE-FORMOTEROL FUMARATE 160-4.5 MCG/ACT IN AERO
2.0000 | INHALATION_SPRAY | Freq: Two times a day (BID) | RESPIRATORY_TRACT | 0 refills | Status: DC
  Filled 2022-12-20: qty 10.2, 30d supply, fill #0

## 2022-12-20 MED ORDER — BUSPIRONE HCL 5 MG PO TABS
5.0000 mg | ORAL_TABLET | Freq: Two times a day (BID) | ORAL | 0 refills | Status: DC
  Filled 2022-12-20: qty 60, 30d supply, fill #0
  Filled 2022-12-20: qty 31, 16d supply, fill #0

## 2022-12-20 MED ORDER — AMOXICILLIN-POT CLAVULANATE 875-125 MG PO TABS
1.0000 | ORAL_TABLET | Freq: Two times a day (BID) | ORAL | 0 refills | Status: AC
Start: 2022-12-20 — End: 2022-12-23
  Filled 2022-12-20: qty 6, 3d supply, fill #0

## 2022-12-20 MED ORDER — GUAIFENESIN ER 600 MG PO TB12
600.0000 mg | ORAL_TABLET | Freq: Two times a day (BID) | ORAL | 0 refills | Status: AC
  Filled 2022-12-20: qty 10, 5d supply, fill #0

## 2022-12-20 MED ORDER — BIKTARVY 50-200-25 MG PO TABS
1.0000 | ORAL_TABLET | Freq: Every day | ORAL | 0 refills | Status: DC
Start: 2022-12-20 — End: 2023-01-24
  Filled 2022-12-20 (×2): qty 30, 30d supply, fill #0

## 2022-12-20 MED ORDER — PREDNISONE 20 MG PO TABS
40.0000 mg | ORAL_TABLET | Freq: Every day | ORAL | 0 refills | Status: AC
  Filled 2022-12-20: qty 6, 3d supply, fill #0

## 2022-12-20 MED ORDER — POTASSIUM CHLORIDE CRYS ER 20 MEQ PO TBCR
40.0000 meq | EXTENDED_RELEASE_TABLET | Freq: Once | ORAL | Status: AC
  Administered 2022-12-20: 40 meq via ORAL
  Filled 2022-12-20: qty 2

## 2022-12-20 MED ORDER — QUETIAPINE FUMARATE ER 300 MG PO TB24
300.0000 mg | ORAL_TABLET | Freq: Every day | ORAL | 0 refills | Status: DC
Start: 2022-12-20 — End: 2023-01-24
  Filled 2022-12-20 (×2): qty 30, 30d supply, fill #0

## 2022-12-20 MED ORDER — GUAIFENESIN ER 600 MG PO TB12: 600.0000 mg | ORAL_TABLET | Freq: Two times a day (BID) | ORAL | Status: DC | PRN

## 2022-12-20 MED ORDER — AZITHROMYCIN 500 MG PO TABS
500.0000 mg | ORAL_TABLET | Freq: Two times a day (BID) | ORAL | 0 refills | Status: DC
  Filled 2022-12-20: qty 6, 3d supply, fill #0

## 2022-12-20 NOTE — Discharge Summary (Signed)
Name: John [name] MRN: [id number] DOB: 12-22-1970 52 y.o. PCP: Pcp, No  Date of Admission: 12/17/2022 12:19 PM Date of Discharge: 12/20/22 Attending Physician: Reymundo Poll, MD  Discharge Diagnosis: 1. Principal Problem:   Multifocal pneumonia   Allergies as of 12/20/2022       Reactions   Shellfish Allergy Anaphylaxis, Swelling        Medication List     STOP taking these medications    Ventolin HFA 108 (90 Base) MCG/ACT inhaler Generic drug: albuterol       TAKE these medications    amoxicillin-clavulanate 875-125 MG tablet Commonly known as: AUGMENTIN Take 1 tablet by mouth 2 (two) times daily for 3 days.   azithromycin 500 MG tablet Commonly known as: ZITHROMAX Take 1 tablet (500 mg total) by mouth 2 (two) times daily for 3 days.   Biktarvy 50-200-25 MG Tabs tablet Generic drug: bictegravir-emtricitabine-tenofovir AF Take 1 tablet by mouth daily.   budesonide-formoterol 160-4.5 MCG/ACT inhaler Commonly known as: Symbicort Inhale 2 puffs into the lungs 2 (two) times daily.   busPIRone 5 MG tablet Commonly known as: BUSPAR Take 1 tablet (5 mg total) by mouth 2 (two) times daily.   gabapentin 100 MG capsule Commonly known as: NEURONTIN Take 2 capsules (200 mg total) by mouth 2 (two) times daily.   guaiFENesin 600 MG 12 hr tablet Commonly known as: Mucinex Take 1 tablet (600 mg total) by mouth 2 (two) times daily for 5 days.   mirtazapine 15 MG tablet Commonly known as: REMERON Take 1 tablet (15 mg total) by mouth at bedtime.   predniSONE 20 MG tablet Commonly known as: DELTASONE Take 2 tablets (40 mg total) by mouth daily with breakfast for 3 days. Start taking on: December 21, 2022   QUEtiapine 300 MG 24 hr tablet Commonly known as: SEROQUEL XR Take 1 tablet (300 mg total) by mouth at bedtime.   traZODone 50 MG tablet Commonly known as: DESYREL Take 1 tablet (50 mg total) by mouth at bedtime as needed for sleep (sleep difficulty).          Disposition and follow-up:   [name] was discharged from [facility] in Stable condition.  At the hospital follow up visit please address:  1.  Follow ups - Pneumonia: resolution of symptoms and completion of antibiotics - HIV: follow up with ID for continue management of HIV. - Primary care provider: follow up for medication refills and continued care of chronic conditions.  2.  Labs / imaging needed at time of follow-up:   - BMP to recheck potassium  3.  Pending labs/ test needing follow-up:   - Infectious Disease will need to follow up on CD4 count  Follow-up Appointments:  Follow-up Information     [facility]             . Schedule an appointment as soon as possible for a visit in 1 week(s).   Why: For HIV management and Biktarvy refills. This center can also help you apply for free/reduced cost medications. Contact information: [address] [place] [address]        Masters, Florentina Addison, DO. Go on 01/10/2023.   Specialty: Internal Medicine Why: You are scheduled for a follow-up appointment with our clinic (located on ground floor of this hospital): 01/10/2023 1:15 PM Contact information: [address] [place] [address] [phone]  Hospital Course by problem list: 1. Multifocal Pneumonia - Patient presented with several weeks of productive cough, shortness of breath, myalgias, subjective fevers and chills.  - On arrival, hemodynamically stable breathing comfortably on room air. Lung exam with diffuse wheezing and crackles.  - No leukocytosis. Lactic acid wnl. Respiratory panel negative. Blood cultures negative. - CXR 12/17/22: concerning for multifocal pneumonia - Possibly CAP vs aspiration in the setting of known polysubstance use disorder - Started on bactrim for PJP coverage given HIV status with unknown CD4 - Added azithromycin and unasyn  for broader coverage, and discontinued bactrim due to low suspicion for PJP. Patient without hypoxia and CXR not consistent with PJP.  - Patient completed 3 days of antibiotics, discharged with an additional 2 days for a 5 day course for CAP treatment.  - Patient also discharged with mucinex to help his cough - On discharge, patient without shortness of breath, improved cough, and oxygen saturations stable on room air.   2. Asthma Exacerbation - On arrival, patient had wheezing on exam with shortness of breath, unimproved by duonebs.  - On antibiotics alone, patient was unimproved with multiple episodes overnight where he required being placed on supplemental oxygen. - Initially treated with scheduled duo-nebs every 6 hours with some improvement. - Started patient on daily prednisone 40mg  with relief of patients shortness of breath and overnight oxygen needs. - Patient received 2 days of prednisone and was discharged with an additional 3 days to complete a 5 day course.  - At discharge, he was resting comfortably on room air, no wheezing but scattered crackles present.  3. HIV - Patient with history of medication noncompliance in the setting of homelessness and stolen medications.  - Last took Biktarvy during prior Admission in September. Restarted in the ED.  - Last CD4 count was 470 over a year ago. - CD4 count 12/20/22: 450 - Patient set up with ID follow up.  4. Hypokalemia - K 3 during admission, repleted with 40 mEq with improvement to normal. - Repeat on day of discharge 3.1. Patient given another of potassium chloride prior to discharge.  Discharge Subjective:   Patient evaluated at bedside. States improvement since admission. Denies any shortness of breath. Still has cough that is productive. States he feels ready to leave hospital.   Discharge Exam:   BP 135/88 (BP Location: Left Arm)   Pulse 95   Temp 97.8 F (36.6 C) (Oral)   Resp 18   Ht 5\' 9"  (1.753 m)   Wt 72.6  kg   SpO2 95%   BMI 23.63 kg/m  Discharge exam: Physical Examination: General appearance - alert, well appearing, and in no distress Mental status - alert, oriented to person, place, and time Eyes - pupils equal and reactive, extraocular eye movements intact Mouth - mucous membranes moist, pharynx normal without lesions, poor dentition Chest - scattered crackles in all lung fields, no tachypnea, no increased effort, no respiratory distress; breathing comfortably on room air Heart - normal rate, regular rhythm, normal S1, S2, no murmurs, rubs, clicks or gallops Abdomen - soft, nontender, nondistended, no masses or organomegaly Musculoskeletal - no joint tenderness, deformity or swelling Extremities - peripheral pulses normal, no pedal edema, no clubbing or cyanosis   Pertinent Labs, Studies, and Procedures:   Respiratory panel 12/17/22: negative for Covid, Influenza, and RSV  Chest XR 12/17/22: Normal lung volumes. Multifocal bilateral lower lobe, middle lobe, and lingular patchy opacities. No pleural effusion or pneumothorax. The heart size and mediastinal  contours are within normal limits. No acute osseous abnormality.   Discharge Instructions:  You were admitted to [facility] for pneumonia, a lung infection. We treated you with antibiotics and steroids. We also restarted you on your previous medications for HIV and mood disorder.   We scheduled follow up appointment with our clinic (located on ground floor of this hospital): 01/10/2023 1:15 PM    We are discharging you with the following new medications, please take them as directed:  - Azithromycin 500mg  daily, for another 3 days  - Augmentin 875mg  twice a day, for another 3 days  - Prednisone 40mg  daily, for another 3 days  - Mucinex 600mg  twice daily for 5 days  - Symbicort inhaler 160/4.5, 2 puffs twice a day    Also, take all your other medications as prescribed. For HIV, we discharged you with your Biktarvy. Please follow  up with the Infectious Disease doctor to continue receiving this medication. It is very important you take this medication daily as prescribed because untreated HIV can put you at risk for life-threatening infections.    It was a pleasure taking care of you, we hope you continue to feel better. Thank you for trusting [name] with your care.   Signed: Dorita Sciara, Medical Student 12/20/2022, 11:05 AM    Attestation for Student Documentation:  I personally was present and re-performed the history, physical exam and medical decision-making activities of this service and have verified that the service and findings are accurately documented in the student's note.  Rana Snare, DO 12/20/2022, 1:00 PM

## 2022-12-20 NOTE — Progress Notes (Addendum)
Discharge instructions reviewed with pt.  Copy of instructions given to pt. [facility] is filling pt scripts, meds will be picked up once they have been filled. Pt may go down to discharge lounge and meds can be taken to pt in d/c lounge if meds are not ready when taking pt out. Pt provided with a bus pass by unit CM, pt plans to go to his Aunt's house upon discharge, he states.   Pt to be d/c'd via wheelchair with belongings.           Will be escorted by staff.   Jt Brabec,RN SWOT   Pt going to d/c lounge---TOC meds will be taken to him in the d/c lounge by the [facility] staff, they were made aware of his location.

## 2022-12-20 NOTE — Discharge Instructions (Signed)
You were admitted to [facility] for pneumonia, a lung infection. We treated you with antibiotics and steroids. We also restarted you on your previous medications for HIV and mood disorder.   We scheduled follow up appointment with our clinic (located on ground floor of this hospital): 01/10/2023 1:15 PM   We are discharging you with the following new medications, please take them as directed: - Azithromycin 500mg  daily, for another 3 days - Augmentin 875mg  twice a day, for another 3 days - Prednisone 40mg  daily, for another 3 days - Mucinex 600mg  twice daily for 5 days - Symbicort inhaler 160/4.5, 2 puffs twice a day   Also, take all your other medications as prescribed. For HIV, we discharged you with your Biktarvy. Please follow up with the Infectious Disease doctor to continue receiving this medication. It is very important you take this medication daily as prescribed because untreated HIV can put you at risk for life-threatening infections.   It was a pleasure taking care of you, we hope you continue to feel better. Thank you for trusting [name] with your care.

## 2022-12-20 NOTE — Progress Notes (Signed)
Transition of Care [facility]) - Inpatient Brief Assessment   Patient Details  Name: John [name] MRN: [id number] Date of Birth: Jun 10, 1970  Transition of Care [facility]) CM/SW Contact:    Janae Bridgeman, RN Phone Number: 12/20/2022, 12:19 PM   Clinical Narrative: Patient admitted for simple pneumonia.  Resources provided in the AVS.  Patient will be discharged by bedside nursing once TOC medications are provided prior to discharge.   Transition of Care Asessment: Insurance and Status: (P) Insurance coverage has been reviewed Patient has primary care physician: (P) No (Patient will follow up with [facility] Team for hospital follow up) Home environment has been reviewed: (P) homeless Prior level of function:: (P) Independent Prior/Current Home Services: (P) No current home services Social Determinants of Health Reivew: (P) SDOH reviewed interventions complete Readmission risk has been reviewed: (P) Yes Transition of care needs: (P) transition of care needs identified, TOC will continue to follow

## 2022-12-22 LAB — CULTURE, BLOOD (ROUTINE X 2)
Culture: NO GROWTH
Culture: NO GROWTH
Special Requests: ADEQUATE

## 2023-01-10 ENCOUNTER — Encounter: Payer: MEDICAID | Admitting: Internal Medicine

## 2023-01-23 ENCOUNTER — Other Ambulatory Visit: Payer: Self-pay

## 2023-01-23 ENCOUNTER — Observation Stay (HOSPITAL_COMMUNITY)
Admission: EM | Admit: 2023-01-23 | Discharge: 2023-01-24 | Disposition: A | Discharge status: Home / Self Care | Payer: MEDICAID | Attending: Internal Medicine | Admitting: Internal Medicine

## 2023-01-23 ENCOUNTER — Encounter (HOSPITAL_COMMUNITY): Payer: Self-pay | Admitting: Emergency Medicine

## 2023-01-23 ENCOUNTER — Emergency Department (HOSPITAL_COMMUNITY): Payer: MEDICAID

## 2023-01-23 DIAGNOSIS — Z79899 Other long term (current) drug therapy: Secondary | ICD-10-CM | POA: Insufficient documentation

## 2023-01-23 DIAGNOSIS — J45901 Unspecified asthma with (acute) exacerbation: Principal | ICD-10-CM | POA: Insufficient documentation

## 2023-01-23 DIAGNOSIS — I10 Essential (primary) hypertension: Secondary | ICD-10-CM | POA: Insufficient documentation

## 2023-01-23 DIAGNOSIS — Z21 Asymptomatic human immunodeficiency virus [HIV] infection status: Secondary | ICD-10-CM | POA: Diagnosis present

## 2023-01-23 DIAGNOSIS — Z1152 Encounter for screening for COVID-19: Secondary | ICD-10-CM | POA: Diagnosis not present

## 2023-01-23 DIAGNOSIS — F17210 Nicotine dependence, cigarettes, uncomplicated: Secondary | ICD-10-CM | POA: Insufficient documentation

## 2023-01-23 DIAGNOSIS — F1290 Cannabis use, unspecified, uncomplicated: Secondary | ICD-10-CM | POA: Diagnosis not present

## 2023-01-23 DIAGNOSIS — J189 Pneumonia, unspecified organism: Principal | ICD-10-CM | POA: Diagnosis present

## 2023-01-23 DIAGNOSIS — R059 Cough, unspecified: Secondary | ICD-10-CM | POA: Diagnosis present

## 2023-01-23 DIAGNOSIS — B20 Human immunodeficiency virus [HIV] disease: Secondary | ICD-10-CM | POA: Diagnosis not present

## 2023-01-23 DIAGNOSIS — F259 Schizoaffective disorder, unspecified: Secondary | ICD-10-CM | POA: Diagnosis not present

## 2023-01-23 DIAGNOSIS — F250 Schizoaffective disorder, bipolar type: Secondary | ICD-10-CM

## 2023-01-23 LAB — BASIC METABOLIC PANEL
Anion gap: 14 (ref 5–15)
BUN: 10 mg/dL (ref 6–20)
CO2: 21 mmol/L — ABNORMAL LOW (ref 22–32)
Calcium: 9.1 mg/dL (ref 8.9–10.3)
Chloride: 102 mmol/L (ref 98–111)
Creatinine, Ser: 0.95 mg/dL (ref 0.61–1.24)
GFR, Estimated: >60 mL/min (ref >60)
Glucose, Bld: 91 mg/dL (ref 70–99)
Potassium: 3.6 mmol/L (ref 3.5–5.1)
Sodium: 137 mmol/L (ref 135–145)

## 2023-01-23 LAB — CBC WITH DIFFERENTIAL/PLATELET
Abs Immature Granulocytes: 0.01 10*3/uL (ref 0.00–0.07)
Basophils Absolute: 0 10*3/uL (ref 0.0–0.1)
Basophils Relative: 1 %
Eosinophils Absolute: 0.1 10*3/uL (ref 0.0–0.5)
Eosinophils Relative: 2 %
HCT: 42.4 % (ref 39.0–52.0)
Hemoglobin: 14.1 g/dL (ref 13.0–17.0)
Immature Granulocytes: 0 %
Lymphocytes Relative: 40 %
Lymphs Abs: 2.3 10*3/uL (ref 0.7–4.0)
MCH: 30.4 pg (ref 26.0–34.0)
MCHC: 33.3 g/dL (ref 30.0–36.0)
MCV: 91.4 fL (ref 80.0–100.0)
Monocytes Absolute: 0.4 10*3/uL (ref 0.1–1.0)
Monocytes Relative: 8 %
Neutro Abs: 2.8 10*3/uL (ref 1.7–7.7)
Neutrophils Relative %: 49 %
Platelets: 378 10*3/uL (ref 150–400)
RBC: 4.64 MIL/uL (ref 4.22–5.81)
RDW: 13.9 % (ref 11.5–15.5)
WBC: 5.7 10*3/uL (ref 4.0–10.5)
nRBC: 0 % (ref 0.0–0.2)

## 2023-01-23 LAB — RESPIRATORY PANEL BY PCR

## 2023-01-23 LAB — RESP PANEL BY RT-PCR (RSV, FLU A&B, COVID)  RVPGX2
Influenza A by PCR: NEGATIVE
Influenza B by PCR: NEGATIVE
Resp Syncytial Virus by PCR: NEGATIVE
SARS Coronavirus 2 by RT PCR: NEGATIVE

## 2023-01-23 LAB — BRAIN NATRIURETIC PEPTIDE: B Natriuretic Peptide: 64 pg/mL (ref 0.0–100.0)

## 2023-01-23 LAB — I-STAT CG4 LACTIC ACID, ED: Lactic Acid, Venous: 1.1 mmol/L (ref 0.5–1.9)

## 2023-01-23 LAB — PROCALCITONIN: Procalcitonin: <0.1 ng/mL

## 2023-01-23 MED ORDER — TRAZODONE HCL 50 MG PO TABS: 50.0000 mg | ORAL_TABLET | Freq: Every evening | ORAL | Status: DC | PRN

## 2023-01-23 MED ORDER — METHYLPREDNISOLONE SODIUM SUCC 125 MG IJ SOLR
125.0000 mg | Freq: Once | INTRAMUSCULAR | Status: AC
  Administered 2023-01-23: 125 mg via INTRAVENOUS
  Filled 2023-01-23: qty 2

## 2023-01-23 MED ORDER — MIRTAZAPINE 15 MG PO TABS
15.0000 mg | ORAL_TABLET | Freq: Every day | ORAL | Status: DC
  Administered 2023-01-23: 15 mg via ORAL
  Filled 2023-01-23: qty 1

## 2023-01-23 MED ORDER — FLUTICASONE FUROATE-VILANTEROL 200-25 MCG/ACT IN AEPB
1.0000 | INHALATION_SPRAY | Freq: Every day | RESPIRATORY_TRACT | Status: DC
  Administered 2023-01-24: 1 via RESPIRATORY_TRACT
  Filled 2023-01-23: qty 28

## 2023-01-23 MED ORDER — QUETIAPINE FUMARATE ER 300 MG PO TB24
300.0000 mg | ORAL_TABLET | Freq: Every day | ORAL | Status: DC
  Administered 2023-01-23: 300 mg via ORAL
  Filled 2023-01-23: qty 6
  Filled 2023-01-23: qty 1

## 2023-01-23 MED ORDER — BUSPIRONE HCL 5 MG PO TABS
5.0000 mg | ORAL_TABLET | Freq: Two times a day (BID) | ORAL | Status: DC
  Administered 2023-01-23 – 2023-01-24 (×2): 5 mg via ORAL
  Filled 2023-01-23 (×2): qty 1

## 2023-01-23 MED ORDER — ALBUTEROL SULFATE (2.5 MG/3ML) 0.083% IN NEBU: 2.5000 mg | INHALATION_SOLUTION | RESPIRATORY_TRACT | Status: DC | PRN

## 2023-01-23 MED ORDER — PREDNISONE 20 MG PO TABS
40.0000 mg | ORAL_TABLET | Freq: Every day | ORAL | Status: DC
  Administered 2023-01-24: 40 mg via ORAL
  Filled 2023-01-23: qty 2

## 2023-01-23 MED ORDER — ACETAMINOPHEN 325 MG PO TABS: 650.0000 mg | ORAL_TABLET | Freq: Four times a day (QID) | ORAL | Status: DC | PRN

## 2023-01-23 MED ORDER — BICTEGRAVIR-EMTRICITAB-TENOFOV 50-200-25 MG PO TABS
1.0000 | ORAL_TABLET | Freq: Every day | ORAL | Status: DC
Start: 2023-01-24 — End: 2023-01-24
  Administered 2023-01-24: 1 via ORAL
  Filled 2023-01-23: qty 1

## 2023-01-23 MED ORDER — ACETAMINOPHEN 650 MG RE SUPP: 650.0000 mg | Freq: Four times a day (QID) | RECTAL | Status: DC | PRN

## 2023-01-23 MED ORDER — RIVAROXABAN 10 MG PO TABS
10.0000 mg | ORAL_TABLET | Freq: Every day | ORAL | Status: DC
  Administered 2023-01-23 – 2023-01-24 (×2): 10 mg via ORAL
  Filled 2023-01-23 (×2): qty 1

## 2023-01-23 MED ORDER — AZITHROMYCIN 250 MG PO TABS
500.0000 mg | ORAL_TABLET | Freq: Once | ORAL | Status: AC
  Administered 2023-01-23: 500 mg via ORAL
  Filled 2023-01-23: qty 2

## 2023-01-23 MED ORDER — SODIUM CHLORIDE 0.9 % IV SOLN
1.0000 g | Freq: Once | INTRAVENOUS | Status: AC
Start: 2023-01-23 — End: 2023-01-23
  Administered 2023-01-23: 1 g via INTRAVENOUS
  Filled 2023-01-23: qty 10

## 2023-01-23 NOTE — ED Notes (Signed)
ED TO INPATIENT HANDOFF REPORT  ED Nurse Name and Phone #: Rodney Booze [phone]  S Name/Age/Gender John [name] 52 y.o. male Room/Bed: 005C/005C  Code Status   Code Status: Full Code  Home/SNF/Other Home Patient oriented to: self, place, time, and situation Is this baseline? Yes   Triage Complete: Triage complete  Chief Complaint CAP (community acquired pneumonia) [J18.9]  Triage Note Patient arrives by EMS with c/o shortness of breath that started yesterday.    Patient reports history of Asthma and reports does not have an inhaler.   Patient received 2 neb treatments PTA.      Allergies Allergies  Allergen Reactions   Shellfish Allergy Anaphylaxis and Swelling    Level of Care/Admitting Diagnosis ED Disposition     ED Disposition  Admit   Condition  --   Comment  Hospital Area: [facility] [[id number]]  Level of Care: Med-Surg [16]  May admit patient to [name] or [facility] if equivalent level of care is available:: No  Covid Evaluation: Asymptomatic - no recent exposure (last 10 days) testing not required  Diagnosis: CAP (community acquired pneumonia) [102725]  Admitting Physician: Dickie La [[id number]]  Attending Physician: Dickie La [[id number]]  Certification:: I certify this patient will need inpatient services for at least 2 midnights  Expected Medical Readiness: 01/25/2023          B Medical/Surgery History Past Medical History:  Diagnosis Date   Acute hypoxemic respiratory failure (HCC) 05/07/2021   Adjustment disorder with depressed mood 10/28/2015   Alcoholism (HCC)    Asthma    Bipolar disorder (HCC)    with depression/anxiety   Cannabis use disorder, moderate, dependence (HCC) 04/02/2015   Chronic low back pain    Cocaine use disorder (HCC) 04/02/2015   Gout    HIV (human immunodeficiency virus infection) (HCC)    "dx'd ~ 2 yr ago" (09/29/2012)   Homelessness    Hypertension    Polysubstance abuse (HCC) 04/11/2019    Past Surgical History:  Procedure Laterality Date   SKIN GRAFT FULL THICKNESS LEG Left ?   POSTERIOR LEFT LEG  AFTER DOG BITES     A IV Location/Drains/Wounds Patient Lines/Drains/Airways Status     Active Line/Drains/Airways     Name Placement date Placement time Site Days   Peripheral IV 01/23/23 20 G Anterior;Right Forearm 01/23/23  1934  Forearm  less than 1            Intake/Output Last 24 hours  Intake/Output Summary (Last 24 hours) at 01/23/2023 2321 Last data filed at 01/23/2023 2023 Gross per 24 hour  Intake 101.98 ml  Output --  Net 101.98 ml    Labs/Imaging Results for orders placed or performed during the hospital encounter of 01/23/23 (from the past 48 hour(s))  Basic metabolic panel     Status: Abnormal   Collection Time: 01/23/23  4:29 PM  Result Value Ref Range   Sodium 137 135 - 145 mmol/L   Potassium 3.6 3.5 - 5.1 mmol/L   Chloride 102 98 - 111 mmol/L   CO2 21 (L) 22 - 32 mmol/L   Glucose, Bld 91 70 - 99 mg/dL    Comment: Glucose reference range applies only to samples taken after fasting for at least 8 hours.   BUN 10 6 - 20 mg/dL   Creatinine, Ser 6.38 0.61 - 1.24 mg/dL   Calcium 9.1 8.9 - 75.6 mg/dL   GFR, Estimated >43 >32 mL/min    Comment: (NOTE) Calculated using  the CKD-EPI Creatinine Equation (2021)    Anion gap 14 5 - 15    Comment: Performed at [facility], [address], [place], [place] [address]  CBC with Differential     Status: None   Collection Time: 01/23/23  4:29 PM  Result Value Ref Range   WBC 5.7 4.0 - 10.5 K/uL   RBC 4.64 4.22 - 5.81 MIL/uL   Hemoglobin 14.1 13.0 - 17.0 g/dL   HCT 19.1 47.8 - 29.5 %   MCV 91.4 80.0 - 100.0 fL   MCH 30.4 26.0 - 34.0 pg   MCHC 33.3 30.0 - 36.0 g/dL   RDW 62.1 30.8 - 65.7 %   Platelets 378 150 - 400 K/uL   nRBC 0.0 0.0 - 0.2 %   Neutrophils Relative % 49 %   Neutro Abs 2.8 1.7 - 7.7 K/uL   Lymphocytes Relative 40 %   Lymphs Abs 2.3 0.7 - 4.0 K/uL   Monocytes Relative  8 %   Monocytes Absolute 0.4 0.1 - 1.0 K/uL   Eosinophils Relative 2 %   Eosinophils Absolute 0.1 0.0 - 0.5 K/uL   Basophils Relative 1 %   Basophils Absolute 0.0 0.0 - 0.1 K/uL   Immature Granulocytes 0 %   Abs Immature Granulocytes 0.01 0.00 - 0.07 K/uL    Comment: Performed at [facility], [address] [place], [place] [address]  Brain natriuretic peptide     Status: None   Collection Time: 01/23/23  4:29 PM  Result Value Ref Range   B Natriuretic Peptide 64.0 0.0 - 100.0 pg/mL    Comment: Performed at [facility], [address], [place], [place] [address]  Resp panel by RT-PCR (RSV, Flu A&B, Covid) Anterior Nasal Swab     Status: None   Collection Time: 01/23/23  4:29 PM   Specimen: Anterior Nasal Swab  Result Value Ref Range   SARS Coronavirus 2 by RT PCR NEGATIVE NEGATIVE   Influenza A by PCR NEGATIVE NEGATIVE   Influenza B by PCR NEGATIVE NEGATIVE    Comment: (NOTE) The Xpert Xpress SARS-CoV-2/FLU/RSV plus assay is intended as an aid in the diagnosis of influenza from Nasopharyngeal swab specimens and should not be used as a sole basis for treatment. Nasal washings and aspirates are unacceptable for Xpert Xpress SARS-CoV-2/FLU/RSV testing.  Fact Sheet for Patients: BloggerCourse.com  Fact Sheet for Healthcare Providers: SeriousBroker.it  This test is not yet approved or cleared by the [place] FDA and has been authorized for detection and/or diagnosis of SARS-CoV-2 by FDA under an Emergency Use Authorization (EUA). This EUA will remain in effect (meaning this test can be used) for the duration of the COVID-19 declaration under Section 564(b)(1) of the Act, 21 U.S.C. section 360bbb-3(b)(1), unless the authorization is terminated or revoked.     Resp Syncytial Virus by PCR NEGATIVE NEGATIVE    Comment: (NOTE) Fact Sheet for Patients: BloggerCourse.com  Fact Sheet for  Healthcare Providers: SeriousBroker.it  This test is not yet approved or cleared by the [place] FDA and has been authorized for detection and/or diagnosis of SARS-CoV-2 by FDA under an Emergency Use Authorization (EUA). This EUA will remain in effect (meaning this test can be used) for the duration of the COVID-19 declaration under Section 564(b)(1) of the Act, 21 U.S.C. section 360bbb-3(b)(1), unless the authorization is terminated or revoked.  Performed at [facility], [address], [place], [place] [address]   I-Stat CG4  Lactic Acid     Status: None   Collection Time: 01/23/23  4:42 PM  Result Value Ref Range   Lactic Acid, Venous 1.1 0.5 - 1.9 mmol/L  Procalcitonin     Status: None   Collection Time: 01/23/23  8:55 PM  Result Value Ref Range   Procalcitonin <0.10 ng/mL    Comment:        Interpretation: PCT (Procalcitonin) <= 0.5 ng/mL: Systemic infection (sepsis) is not likely. Local bacterial infection is possible. (NOTE)       Sepsis PCT Algorithm           Lower Respiratory Tract                                      Infection PCT Algorithm    ----------------------------     ----------------------------         PCT < 0.25 ng/mL                PCT < 0.10 ng/mL          Strongly encourage             Strongly discourage   discontinuation of antibiotics    initiation of antibiotics    ----------------------------     -----------------------------       PCT 0.25 - 0.50 ng/mL            PCT 0.10 - 0.25 ng/mL               OR       >80% decrease in PCT            Discourage initiation of                                            antibiotics      Encourage discontinuation           of antibiotics    ----------------------------     -----------------------------         PCT >= 0.50 ng/mL              PCT 0.26 - 0.50 ng/mL               AND        <80% decrease in PCT             Encourage initiation of                                              antibiotics       Encourage continuation           of antibiotics    ----------------------------     -----------------------------        PCT >= 0.50 ng/mL                  PCT > 0.50 ng/mL               AND         increase in PCT                  Strongly encourage  initiation of antibiotics    Strongly encourage escalation           of antibiotics                                     -----------------------------                                           PCT <= 0.25 ng/mL                                                 OR                                        > 80% decrease in PCT                                      Discontinue / Do not initiate                                             antibiotics  Performed at [facility], [address], [place], [place] [address]   Respiratory (~20 pathogens) panel by PCR     Status: None   Collection Time: 01/23/23  8:55 PM   Specimen: Nasopharyngeal Swab; Respiratory  Result Value Ref Range   Adenovirus NOT DETECTED NOT DETECTED   Coronavirus 229E NOT DETECTED NOT DETECTED    Comment: (NOTE) The Coronavirus on the Respiratory Panel, DOES NOT test for the novel  Coronavirus (2019 nCoV)    Coronavirus HKU1 NOT DETECTED NOT DETECTED   Coronavirus NL63 NOT DETECTED NOT DETECTED   Coronavirus OC43 NOT DETECTED NOT DETECTED   Metapneumovirus NOT DETECTED NOT DETECTED   Rhinovirus / Enterovirus NOT DETECTED NOT DETECTED   Influenza A NOT DETECTED NOT DETECTED   Influenza B NOT DETECTED NOT DETECTED   Parainfluenza Virus 1 NOT DETECTED NOT DETECTED   Parainfluenza Virus 2 NOT DETECTED NOT DETECTED   Parainfluenza Virus 3 NOT DETECTED NOT DETECTED   Parainfluenza Virus 4 NOT DETECTED NOT DETECTED   Respiratory Syncytial Virus NOT DETECTED NOT DETECTED   Bordetella pertussis NOT DETECTED NOT DETECTED   Bordetella Parapertussis NOT DETECTED NOT DETECTED   Chlamydophila pneumoniae  NOT DETECTED NOT DETECTED   Mycoplasma pneumoniae NOT DETECTED NOT DETECTED    Comment: Performed at [facility], [address], [place], [place] [address]   CT CHEST WO CONTRAST  Result Date: 01/23/2023 CLINICAL DATA:  Shortness of breath, concern for pneumonia. EXAM: CT CHEST WITHOUT CONTRAST TECHNIQUE: Multidetector CT imaging of the chest was performed following the standard protocol without IV contrast. RADIATION DOSE REDUCTION: This exam was performed according to the departmental dose-optimization program which includes automated exposure control, adjustment of the mA and/or kV according to patient size and/or use of iterative reconstruction technique. COMPARISON:  Chest CT dated 11/22/2021 and  same day chest radiograph. FINDINGS: Cardiovascular: No significant vascular findings. Normal heart size. No pericardial effusion. Mediastinum/Nodes: No enlarged mediastinal or axillary lymph nodes. Thyroid gland, trachea, and esophagus demonstrate no significant findings. Lungs/Pleura: Paraseptal emphysematous changes are seen in the apices. Mild tree-in-bud opacities, ground-glass opacities, and consolidation in the right middle lobe and lingula. There is minimal bibasilar atelectasis. No pleural effusion or pneumothorax. Upper Abdomen: No acute abnormality. Musculoskeletal: Degenerative changes are seen in the spine. IMPRESSION: 1. Mild tree-in-bud opacities, ground-glass opacities, and consolidation in the right middle lobe and lingula are consistent with pneumonia. Emphysema (ICD10-J43.9). Electronically Signed   By: Romona Curls M.D.   On: 01/23/2023 17:15   DG Chest Port 1 View  Result Date: 01/23/2023 CLINICAL DATA:  Dyspnea EXAM: PORTABLE CHEST 1 VIEW COMPARISON:  Chest radiograph dated 12/17/2022. FINDINGS: The heart size and mediastinal contours are within normal limits. Both lungs are clear. The visualized skeletal structures are unremarkable. IMPRESSION: No active disease.  Electronically Signed   By: Romona Curls M.D.   On: 01/23/2023 17:10    Pending Labs Unresulted Labs (From admission, onward)     Start     Ordered   01/24/23 0500  Comprehensive metabolic panel  Tomorrow morning,   R        01/23/23 2031   01/24/23 0500  CBC  Tomorrow morning,   R        01/23/23 2031   01/23/23 2110  T-helper cells (CD4) count (not at [facility])  Once,   R        01/23/23 2110   01/23/23 2110  HIV-1 RNA quant-no reflex-bld  Once,   R        01/23/23 2110   01/23/23 2101  Expectorated Sputum Assessment w Gram Stain, Rflx to Resp Cult  Once,   R        01/23/23 2100   01/23/23 2100  Pneumocystis PCR  Once,   R        01/23/23 2059   01/23/23 1602  Blood culture (routine x 2)  BLOOD CULTURE X 2,   R (with STAT occurrences)      01/23/23 1601            Vitals/Pain Today's Vitals   01/23/23 1606 01/23/23 1608 01/23/23 1615 01/23/23 1939  BP:  (!) 140/74 (!) 143/89 138/88  Pulse: (!) 103 88 87 93  Resp: 19 (!) 22 (!) 24 18  Temp: 99 F (37.2 C)   98.9 F (37.2 C)  TempSrc: Oral   Oral  SpO2: 97% 94% 97% 95%  Weight:  68 kg    Height:  5\' 9"  (1.753 m)    PainSc:    0-No pain    Isolation Precautions Droplet precaution  Medications Medications  rivaroxaban (XARELTO) tablet 10 mg (10 mg Oral Given 01/23/23 2054)  acetaminophen (TYLENOL) tablet 650 mg (has no administration in time range)    Or  acetaminophen (TYLENOL) suppository 650 mg (has no administration in time range)  predniSONE (DELTASONE) tablet 40 mg (has no administration in time range)  fluticasone furoate-vilanterol (BREO ELLIPTA) 200-25 MCG/ACT 1 puff (has no administration in time range)  albuterol (PROVENTIL) (2.5 MG/3ML) 0.083% nebulizer solution 2.5 mg (has no administration in time range)  bictegravir-emtricitabine-tenofovir AF (BIKTARVY) 50-200-25 MG per tablet 1 tablet (has no administration in time range)  busPIRone (BUSPAR) tablet 5 mg (has no administration in time range)   mirtazapine (REMERON) tablet 15 mg (has no administration in time range)  QUEtiapine (SEROQUEL XR) 24 hr tablet 300 mg (has no administration in time range)  traZODone (DESYREL) tablet 50 mg (has no administration in time range)  methylPREDNISolone sodium succinate (SOLU-MEDROL) 125 mg/2 mL injection 125 mg (125 mg Intravenous Given 01/23/23 1700)  cefTRIAXone (ROCEPHIN) 1 g in sodium chloride 0.9 % 100 mL IVPB (0 g Intravenous Stopped 01/23/23 2023)  azithromycin (ZITHROMAX) tablet 500 mg (500 mg Oral Given 01/23/23 1817)    Mobility walks     Focused Assessments Cardiac Assessment Handoff:  Cardiac Rhythm: Normal sinus rhythm Lab Results  Component Value Date   CKTOTAL 147 11/22/2021   CKMB 2.3 10/12/2013   TROPONINI <0.30 09/29/2012   Lab Results  Component Value Date   DDIMER <0.27 01/28/2022   Does the Patient currently have chest pain? No    R Recommendations: See Admitting Provider Note  Report given to:   Additional Notes:

## 2023-01-23 NOTE — Sepsis Progress Note (Signed)
Sepsis protocol is being followed by [facility]. 
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
## 2023-01-23 NOTE — ED Triage Notes (Signed)
Patient arrives by EMS with c/o shortness of breath that started yesterday.    Patient reports history of Asthma and reports does not have an inhaler.   Patient received 2 neb treatments PTA.

## 2023-01-24 ENCOUNTER — Other Ambulatory Visit (HOSPITAL_COMMUNITY): Payer: Self-pay

## 2023-01-24 DIAGNOSIS — J45901 Unspecified asthma with (acute) exacerbation: Principal | ICD-10-CM

## 2023-01-24 DIAGNOSIS — B20 Human immunodeficiency virus [HIV] disease: Secondary | ICD-10-CM | POA: Diagnosis not present

## 2023-01-24 DIAGNOSIS — F259 Schizoaffective disorder, unspecified: Secondary | ICD-10-CM | POA: Diagnosis not present

## 2023-01-24 DIAGNOSIS — Z21 Asymptomatic human immunodeficiency virus [HIV] infection status: Secondary | ICD-10-CM

## 2023-01-24 DIAGNOSIS — F250 Schizoaffective disorder, bipolar type: Secondary | ICD-10-CM

## 2023-01-24 LAB — COMPREHENSIVE METABOLIC PANEL
ALT: 14 U/L (ref 0–44)
AST: 15 U/L (ref 15–41)
Albumin: 2.9 g/dL — ABNORMAL LOW (ref 3.5–5.0)
Alkaline Phosphatase: 80 U/L (ref 38–126)
Anion gap: 9 (ref 5–15)
BUN: 16 mg/dL (ref 6–20)
CO2: 23 mmol/L (ref 22–32)
Calcium: 8.9 mg/dL (ref 8.9–10.3)
Chloride: 106 mmol/L (ref 98–111)
Creatinine, Ser: 0.85 mg/dL (ref 0.61–1.24)
GFR, Estimated: >60 mL/min (ref >60)
Glucose, Bld: 131 mg/dL — ABNORMAL HIGH (ref 70–99)
Potassium: 4.1 mmol/L (ref 3.5–5.1)
Sodium: 138 mmol/L (ref 135–145)
Total Bilirubin: 0.3 mg/dL (ref <1.2)
Total Protein: 6.2 g/dL — ABNORMAL LOW (ref 6.5–8.1)

## 2023-01-24 LAB — EXPECTORATED SPUTUM ASSESSMENT W GRAM STAIN, RFLX TO RESP C

## 2023-01-24 LAB — CBC
HCT: 36.9 % — ABNORMAL LOW (ref 39.0–52.0)
Hemoglobin: 11.9 g/dL — ABNORMAL LOW (ref 13.0–17.0)
MCH: 29.1 pg (ref 26.0–34.0)
MCHC: 32.2 g/dL (ref 30.0–36.0)
MCV: 90.2 fL (ref 80.0–100.0)
Platelets: 367 10*3/uL (ref 150–400)
RBC: 4.09 MIL/uL — ABNORMAL LOW (ref 4.22–5.81)
RDW: 13.9 % (ref 11.5–15.5)
WBC: 4.6 10*3/uL (ref 4.0–10.5)
nRBC: 0 % (ref 0.0–0.2)

## 2023-01-24 LAB — RPR: RPR Ser Ql: NONREACTIVE

## 2023-01-24 LAB — RAPID URINE DRUG SCREEN, HOSP PERFORMED
Amphetamines: NOT DETECTED
Barbiturates: NOT DETECTED
Benzodiazepines: NOT DETECTED
Cocaine: POSITIVE — AB
Opiates: NOT DETECTED
Tetrahydrocannabinol: NOT DETECTED

## 2023-01-24 MED ORDER — BIKTARVY 50-200-25 MG PO TABS
1.0000 | ORAL_TABLET | Freq: Every day | ORAL | 0 refills | Status: DC
  Filled 2023-01-24: qty 30, 30d supply, fill #0

## 2023-01-24 MED ORDER — MIRTAZAPINE 15 MG PO TABS
15.0000 mg | ORAL_TABLET | Freq: Every day | ORAL | 0 refills | Status: AC
  Filled 2023-01-24: qty 30, 30d supply, fill #0

## 2023-01-24 MED ORDER — PREDNISONE 20 MG PO TABS
40.0000 mg | ORAL_TABLET | Freq: Every day | ORAL | 0 refills | Status: AC
  Filled 2023-01-24: qty 8, 4d supply, fill #0

## 2023-01-24 MED ORDER — BUSPIRONE HCL 5 MG PO TABS
5.0000 mg | ORAL_TABLET | Freq: Two times a day (BID) | ORAL | 0 refills | Status: AC
  Filled 2023-01-24: qty 60, 30d supply, fill #0

## 2023-01-24 MED ORDER — QUETIAPINE FUMARATE ER 300 MG PO TB24
300.0000 mg | ORAL_TABLET | Freq: Every day | ORAL | 0 refills | Status: AC
  Filled 2023-01-24: qty 30, 30d supply, fill #0

## 2023-01-24 MED ORDER — BUDESONIDE-FORMOTEROL FUMARATE 160-4.5 MCG/ACT IN AERO
2.0000 | INHALATION_SPRAY | Freq: Two times a day (BID) | RESPIRATORY_TRACT | 0 refills | Status: AC
  Filled 2023-01-24: qty 10.2, 30d supply, fill #0

## 2023-01-24 MED ORDER — TRAZODONE HCL 50 MG PO TABS
50.0000 mg | ORAL_TABLET | Freq: Every evening | ORAL | 0 refills | Status: AC | PRN
  Filled 2023-01-24: qty 30, 30d supply, fill #0

## 2023-01-24 MED ORDER — ALBUTEROL SULFATE (2.5 MG/3ML) 0.083% IN NEBU
2.5000 mg | INHALATION_SOLUTION | RESPIRATORY_TRACT | 12 refills | Status: DC | PRN
  Filled 2023-01-24: qty 75, 5d supply, fill #0

## 2023-01-24 MED ORDER — ALBUTEROL SULFATE HFA 108 (90 BASE) MCG/ACT IN AERS
2.0000 | INHALATION_SPRAY | RESPIRATORY_TRACT | 2 refills | Status: DC | PRN
Start: 2023-01-24 — End: 2024-02-08
  Filled 2023-01-24: qty 18, 30d supply, fill #0

## 2023-01-24 NOTE — Consult Note (Signed)
[name] Consult Evaluation  Service Date: January 24, 2023 LOS:  LOS: 1 day    Primary Psychiatric Diagnoses  Schizoaffective d/o vs substance induced psychotic d/o  Assessment  John [name] is a 52 y.o. male admitted medically for 01/23/2023  3:52 PM for cough and SOB/pneumonia. He carries the psychiatric diagnoses of schizophrenia, bipolar disorder, depression (all listed at various points) and multiple substance use disorders (cocaine, nicotine, THC) and has a past medical history of  HIV . Psychiatry was consulted for Long term management maybe injectables  by Dr. Verda Cumins. He was last seen by psychiatry in June of 2024, at which time he was felt to meet inpatient admission criteria due to suicidal ideations.   He appears to get most of his psychiatric medications from his PCP and has most recently been on quetiapine   Today pt denied all psyhcotic sx, and most depressive spectrum sx besides low mood. States he has been semi compliant with his medications, that they help, and that he is interested in going back on them. Has some interest in o/p f/u and resources provided in dc instructions personally before pt was dc. Spent bulk of interview validating pt's steps toward sobriety (has quit EtOH, cut back nicotine + crack, and has no intention of quitting weed) and discussing reasons to f/u in the future. Pt had no interest in discussing med changes or MAT for any of his assorted use disorders - unfortunately there is no quetiapine LAI. Otherwise fairly help-seeking - came to hospital of own volition for SI.    Diagnoses:  Active Hospital problems: Principal Problem:   Asthma with exacerbation Active Problems:   HIV (human immunodeficiency virus infection) (HCC)   Schizoaffective disorder (HCC)   Asthma exacerbation     Plan   ## Psychiatric Medication Recommendations:  -- Continue outpt meds  - buspirone 5 BID, mirtazapien 15 at bedtime, quetiapine 300 XL at  bedtime, trazodone 50 at bedtime  ## Medical Decision Making Capacity:  -- Not specifically addressed this encounter.   ## Further Work-up:  -- None currently - dc later otday   -- most recent EKG on 11/10 had QtC of 432 -- Pertinent labwork reviewed earlier this admission includes: low albumin (2.9), UDS pending, expect + THC cocaine CD4 count 450 ~55m ago  ## Disposition:  -- There are no current psychiatric contraindications to discharge at this time  ## Behavioral / Environmental:  --   No specific recommendations at this time.     ## Safety and Observation Level:  - Based on my clinical evaluation, I estimate the patient to be at low risk of self harm in the current setting - At this time, we recommend a routine level of observation. This decision is based on my review of the chart including patient's history and current presentation, interview of the patient, mental status examination, and consideration of suicide risk including evaluating suicidal ideation, plan, intent, suicidal or self-harm behaviors, risk factors, and protective factors. This judgment is based on our ability to directly address suicide risk, implement suicide prevention strategies and develop a safety plan while the patient is in the clinical setting. Please contact our team if there is a concern that risk level has changed.  Suicide risk assessment  Patient has following modifiable risk factors for suicide: untreated depression and lack of access to outpatient mental health resources, which we are addressing by giving 30 day supply of prev effective meds, providing outpt resources, reviewing return precautions.  Patient has following non-modifiable or demographic risk factors for suicide: history of suicide attempt and psychiatric hospitalization, low SES/homeless  Patient has the following protective factors against suicide: hope for the future   Thank you for this consult request. Recommendations have  been communicated to the primary team.  We will continue to follow at this time.   John [name] A John [name]  Psychiatric and Social History   Relevant Aspects of Hospital Course:  Admitted on 01/23/2023 for SOB/pneumonia. They have received antibiotics.   Patient Report:  Pt seen in AM. Fully oriented.  Was disinterested in psych consult but answered most questions politely. He reports low mood related to homelessness, but is actively working with a Pharmacist, hospital to secure housing and is hopeful this will work out. Otherwise denies most psych sx as below. He feels his medications work well for him, and has poor understanding of the need to f/u outpt with psychiatry (gets most meds after medical hospitalizations). Discussed this extensively and pt provided resources.  Pt reports that he has quit EtOH and cut his cocaine use down to about 1x/month. He has cut cigarette smoking to 0.5 pppd (from 1.5 ppd). He is precontemplative for quitting weed. These efforts (and successes!) were congratulated as appropriate. He refused any MAT to aid in recovery. He declined all inpt resources discussed. States his sx are worse when using but present even when sober.   WE discussed reasons to present for urgent assessment and best place to go for that.   Psych ROS:  Depression: + low mood, otherwise has good appetite, energy, etc, no SI Mania (lifetime and current): Lifetime yes, although unclear if sx present when sober. No s/s currently.  Psychosis: (lifetime and current): As above - pt is organized, rational, not RIS and denying AH/VH.  Collateral information:  None today, gave essentially same sx inventory to Dr. Weston Settle this AM.   Psychiatric History:  Information collected from Pt, medical record  Prev Dx/Sx: Numerous.  Current Psych Provider: None Home Meds (current): buspirone 5 BID, mirtazapien 15 at bedtime, quetiapine 300 XL at bedtime, trazodone 50 at bedtime Previous Med Trials:  Remeron  ,seroquel,trazodone,risperidone,haldol,ambien,wellbutrin   Zyprexa zydis 25 mg ,seroquel     Therapy: No  Prior ECT: no Prior Psych Hospitalization: yes  Prior Self Harm: Pt has atleast 4 suicide attempts in the past - when he tried to cut self, stabbed self.   Prior Violence: no via chart review, has had CAH telling to harm others. Family Psych History:  Alcoholism Mother Depression Mother Alcoholism Brother     Social History:   Living Situation: Undomiciled Access to weapons: No  Substance History Tobacco use: 0.5 ppd down from 1 ppd Alcohol use: no Drug use: cocaine 1x/month, frequent weed.    Exam Findings   Psychiatric Specialty Exam: Presentation  General Appearance: Disheveled  Eye Contact:Good  Speech:Clear and Coherent  Speech Volume:Normal  Handedness:Right   Mood and Affect  Mood:-- ("down")  Affect:Congruent; Full Range; Appropriate   Thought Process  Thought Processes:Coherent  Descriptions of Associations:Intact  Orientation:Full (Time, Place and Person)  Thought Content:Logical (devoid delusions, paranoia)  Hallucinations:Hallucinations: None  Ideas of Reference:None  Suicidal Thoughts:Suicidal Thoughts: No  Homicidal Thoughts:Homicidal Thoughts: No   Sensorium  Memory:Immediate Fair; Recent Fair; Remote Fair  Judgment:Fair  Insight:Poor; Fair   Art therapist  Concentration:Fair  Attention Span:Good  Recall:Fair  [employer] of Knowledge:Fair  Language:Fair   Psychomotor Activity  Psychomotor Activity:Psychomotor Activity: Normal   Assets  Assets:Desire for Improvement  Sleep  Sleep:Sleep: Fair    Physical Exam: Vital signs:  Temp:  [97.7 F (36.5 C)-99 F (37.2 C)] 97.7 F (36.5 C) (11/11 0820) Pulse Rate:  [52-125] 90 (11/11 1226) Resp:  [18-24] 20 (11/11 0607) BP: (116-143)/(68-100) 137/100 (11/11 0820) SpO2:  [78 %-100 %] 100 % (11/11 1226) Weight:  [68 kg] 68 kg (11/10 1608) Physical  Exam HENT:     Head: Normocephalic.  Pulmonary:     Effort: Pulmonary effort is normal.  Neurological:     Mental Status: He is alert and oriented to person, place, and time.      Blood pressure (!) 137/100, pulse 90, temperature 97.7 F (36.5 C), resp. rate 20, height 5\' 9"  (1.753 m), weight 68 kg, SpO2 100%. Body mass index is 22.15 kg/m.   Other History   These have been pulled in through the EMR, reviewed, and updated if appropriate.   Family History:  The patient's family history includes Alcoholism in his brother and mother; Depression in his mother.  Medical History: Past Medical History:  Diagnosis Date   Acute hypoxemic respiratory failure (HCC) 05/07/2021   Adjustment disorder with depressed mood 10/28/2015   Alcoholism (HCC)    Asthma    Bipolar disorder (HCC)    with depression/anxiety   Cannabis use disorder, moderate, dependence (HCC) 04/02/2015   Chronic low back pain    Cocaine use disorder (HCC) 04/02/2015   Gout    HIV (human immunodeficiency virus infection) (HCC)    "dx'd ~ 2 yr ago" (09/29/2012)   Homelessness    Hypertension    Polysubstance abuse (HCC) 04/11/2019    Surgical History: Past Surgical History:  Procedure Laterality Date   SKIN GRAFT FULL THICKNESS LEG Left ?   POSTERIOR LEFT LEG  AFTER DOG BITES    Medications:   Current Facility-Administered Medications:    acetaminophen (TYLENOL) tablet 650 mg, 650 mg, Oral, Q6H PRN **OR** acetaminophen (TYLENOL) suppository 650 mg, 650 mg, Rectal, Q6H PRN, Champ Mungo, DO   albuterol (PROVENTIL) (2.5 MG/3ML) 0.083% nebulizer solution 2.5 mg, 2.5 mg, Inhalation, Q4H PRN, Champ Mungo, DO   bictegravir-emtricitabine-tenofovir AF (BIKTARVY) 50-200-25 MG per tablet 1 tablet, 1 tablet, Oral, Daily, Champ Mungo, DO, 1 tablet at 01/24/23 0808   busPIRone (BUSPAR) tablet 5 mg, 5 mg, Oral, BID, Champ Mungo, DO, 5 mg at 01/24/23 0808   fluticasone furoate-vilanterol (BREO ELLIPTA) 200-25 MCG/ACT 1  puff, 1 puff, Inhalation, Daily, Champ Mungo, DO, 1 puff at 01/24/23 0808   mirtazapine (REMERON) tablet 15 mg, 15 mg, Oral, QHS, Champ Mungo, DO, 15 mg at 01/23/23 2329   predniSONE (DELTASONE) tablet 40 mg, 40 mg, Oral, Q breakfast, Champ Mungo, DO, 40 mg at 01/24/23 0808   QUEtiapine (SEROQUEL XR) 24 hr tablet 300 mg, 300 mg, Oral, QHS, Champ Mungo, DO, 300 mg at 01/23/23 2328   rivaroxaban (XARELTO) tablet 10 mg, 10 mg, Oral, Daily, Champ Mungo, DO, 10 mg at 01/24/23 0808   traZODone (DESYREL) tablet 50 mg, 50 mg, Oral, QHS PRN, Champ Mungo, DO  Current Outpatient Medications:    albuterol (VENTOLIN HFA) 108 (90 Base) MCG/ACT inhaler, Inhale 2 puffs into the lungs every 4 (four) hours as needed for wheezing or shortness of breath., Disp: 18 g, Rfl: 2   [START ON 01/25/2023] bictegravir-emtricitabine-tenofovir AF (BIKTARVY) 50-200-25 MG TABS tablet, Take 1 tablet by mouth daily., Disp: 30 tablet, Rfl: 0   budesonide-formoterol (SYMBICORT) 160-4.5 MCG/ACT inhaler, Inhale 2 puffs into the lungs 2 (two) times daily.,  Disp: 10.2 g, Rfl: 0   busPIRone (BUSPAR) 5 MG tablet, Take 1 tablet (5 mg total) by mouth 2 (two) times daily., Disp: 60 tablet, Rfl: 0   mirtazapine (REMERON) 15 MG tablet, Take 1 tablet (15 mg total) by mouth at bedtime., Disp: 30 tablet, Rfl: 0   [START ON 01/25/2023] predniSONE (DELTASONE) 20 MG tablet, Take 2 tablets (40 mg total) by mouth daily with breakfast for 4 days., Disp: 8 tablet, Rfl: 0   QUEtiapine (SEROQUEL XR) 300 MG 24 hr tablet, Take 1 tablet (300 mg total) by mouth at bedtime., Disp: 30 tablet, Rfl: 0   traZODone (DESYREL) 50 MG tablet, Take 1 tablet (50 mg total) by mouth at bedtime as needed for sleep (sleep difficulty)., Disp: 30 tablet, Rfl: 0  Allergies: Allergies  Allergen Reactions   Shellfish Allergy Anaphylaxis and Swelling

## 2023-01-24 NOTE — Discharge Instructions (Addendum)
  It was a pleasure taking care of you at [facility]. You were admitted for acute asthma exacerbation and treated for asthma, HIV, schizoaffective disorder. We are discharging you home now that you are doing better. Please follow the following instructions.  1) Follow with your new PCP, Annett Fabian (11/25 at the internal medicine clinic here in the hospital) 2) Complete your 4 day course of prednisone to treat your acute asthma exacerbation. 3) Take your HIV medications as described. The [facility] for [facility] should have given you recommendations. 4) Take your other psychiatric medications as recommended, please consider following up with the [facility]gent [facility] if you would like to have support and help with your substance use goals.  Take care,  Dr Jaymes Graff, MD  Instructions from Psychiatry: Hello! You were seen by psychiatry this admission, and requested information on outpt resources. Best option for you (as discussed) is upstairs from the urgent care. If you ever have suicidal thoughts, homicidal thoughts, an interest in rehab (congrats on cutting back!) or otherwise need urgent psychiatric care those services are downstairs - please don't hesitate to use them.   [place] funds mental health services for residents on medicaid or who are uninsured.   This is at the [facility]  [address] [place], [place] [address] [phone]  They have walk-in outpt appointments on the second floor on weekday mornings (call to verify hours). These are first-come, first-serve, so try to get there early or you might get pushed to the next day.  On the first floor they have a 24/7 psychiatric urgent care for emergences and a unit equipped to take care of mild detox; they can also help link you to rehab facilities.  Outpatient Therapy Resources for Patients in [facility]:

## 2023-01-24 NOTE — Progress Notes (Signed)
Patient receive from ED with complain of SOB, patient uncooperative during assessment and and answered No for all the admission screening questions.

## 2023-01-24 NOTE — Discharge Summary (Addendum)
Name: John [name] MRN: [id number] DOB: 04/19/1970 52 y.o. PCP: Pcp, No  Date of Admission: 01/23/2023  3:52 PM Date of Discharge: 01/24/2023  1:47 PM Attending Physician: Dickie La, MD  Discharge Diagnosis: 1. Principal Problem:   Asthma with exacerbation Active Problems:   HIV (human immunodeficiency virus infection) (HCC)   Schizoaffective disorder (HCC)   Asthma exacerbation  Summary: John [name] is a 52 yo m w/ pertinent hx of HIV, asthma, schizoaffective vs bipolar disorder, polysubstance abuse who was admitted 11/10 with several days of cough/SOB. He had a recent admission one month ago for multifocal pneumonia. He was off all medications except albuterol for the last 3 weeks.   Overnight Events: No acute events overnight.   This morning, saw the patient with the internal medicine teaching service team, including attending physician Dr Sol Blazing, senior resident Dr Allena Katz, medical student Deatra Ina, and the pharmacy team. Pt expressed that he still has some shortness of breath but better than at admission. He is doing well and has no acute complaints.  Discharge Medications: Allergies as of 01/24/2023       Reactions   Shellfish Allergy Anaphylaxis, Swelling        Medication List     TAKE these medications    albuterol (2.5 MG/3ML) 0.083% nebulizer solution Commonly known as: PROVENTIL Inhale 3 mLs (2.5 mg total) into the lungs every 4 (four) hours as needed for wheezing or shortness of breath.   Biktarvy 50-200-25 MG Tabs tablet Generic drug: bictegravir-emtricitabine-tenofovir AF Take 1 tablet by mouth daily. Start taking on: January 25, 2023   budesonide-formoterol 160-4.5 MCG/ACT inhaler Commonly known as: Symbicort Inhale 2 puffs into the lungs 2 (two) times daily.   busPIRone 5 MG tablet Commonly known as: BUSPAR Take 1 tablet (5 mg total) by mouth 2 (two) times daily.   mirtazapine 15 MG tablet Commonly known as: REMERON Take 1 tablet (15 mg  total) by mouth at bedtime.   predniSONE 20 MG tablet Commonly known as: DELTASONE Take 2 tablets (40 mg total) by mouth daily with breakfast for 4 days. Start taking on: January 25, 2023   QUEtiapine 300 MG 24 hr tablet Commonly known as: SEROQUEL XR Take 1 tablet (300 mg total) by mouth at bedtime.   traZODone 50 MG tablet Commonly known as: DESYREL Take 1 tablet (50 mg total) by mouth at bedtime as needed for sleep (sleep difficulty).        Disposition and follow-up:   John.Johnta Corporon was discharged from [facility] in Good condition.  At the hospital follow up visit please address:  1.  HIV therapy - ID f/u was arranged, please ensure patient is able to make it to these appointments and is adhering to HIV medication  2.  Pending labs/ test needing follow-up: CD4, PCP PCR, HIV RNA load  Follow-up Appointments:  Follow-up Information     Annett Fabian, MD. Schedule an appointment as soon as possible for a visit on 02/07/2023.   Specialty: Internal Medicine Why: Please go to you appointment at the [facility] at 3:15pm Contact information: [address] [place] [address] [phone]                 Hospital Course by problem list: John [name] is a 52 y.o. with a pertinent PMH of HIV, asthma, , who presented with 3 weeks of increasing sputum and 3 days of worsening cough/SOB and admitted for recurrent pneumonia in setting of HIV+, not on medications.  Asthma in setting of HIV+ status Pt was admitted and has been receiving steroids, SABAs and 1x doses of antibiotics. CXR unremarkable, CT Chest with findings possible for pneumonia. Viral PCR panels all negative. Pt has historically done well with his asthma medication adherence. Low suspicion for bacterial pneumonia based on clinical and lab findings. Discharged with medication for asthma exacerbation. - albuterol  - fluticasone furoate-vilanterol  - prednisone 40 mg for four days - ending  11/15   2.  Human Immunodeficiency Virus, Pt has history of HIV+ status. He is/not sexually active. He has not been on HAART for the last three weeks. His last CD4 count prior to this admission was 451.   3.  Schizoaffective Disorder vs Bipolar Disorder Psych consult placed. Discussed patient with psychiatric attending prior to consult. Appreciate their recommendations. - Buspirone - 5 mg BID - Mirtazapine - 15 mg at bedtime  - quetiapine - 300 mg at bedtime  - trazodone - 50 mg at bedtime   Discharge Exam:   BP (!) 137/100 (BP Location: Right Arm)   Pulse 91   Temp 97.7 F (36.5 C)   Resp 20   Ht 5\' 9"  (1.753 m)   Wt 68 kg   SpO2 100%   BMI 22.15 kg/m  Discharge exam:  Physical Exam Vitals and nursing note reviewed.  Constitutional:      General: He is not in acute distress.    Appearance: He is not ill-appearing.  HENT:     Head: Normocephalic and atraumatic.     Mouth/Throat:     Mouth: Mucous membranes are moist.  Eyes:     Extraocular Movements: Extraocular movements intact.     Pupils: Pupils are equal, round, and reactive to light.  Cardiovascular:     Rate and Rhythm: Normal rate and regular rhythm.  Pulmonary:     Effort: Pulmonary effort is normal.     Breath sounds: Wheezing present.     Comments: Specifically heard wheezing on front when doing the cardiac exam. Lung fields did not have wheezing on posterior field examinations. Abdominal:     General: Bowel sounds are normal.     Palpations: Abdomen is soft.  Neurological:     General: No focal deficit present.     Mental Status: He is alert.  Psychiatric:        Mood and Affect: Mood normal.        Behavior: Behavior normal.      Pertinent Labs, Studies, and Procedures:  CMP     Component Value Date/Time   NA 138 01/24/2023 0725   K 4.1 01/24/2023 0725   CL 106 01/24/2023 0725   CO2 23 01/24/2023 0725   GLUCOSE 131 (H) 01/24/2023 0725   BUN 16 01/24/2023 0725   CREATININE 0.85 01/24/2023  0725   CREATININE 0.98 10/27/2020 1038   CALCIUM 8.9 01/24/2023 0725   PROT 6.2 (L) 01/24/2023 0725   ALBUMIN 2.9 (L) 01/24/2023 0725   AST 15 01/24/2023 0725   ALT 14 01/24/2023 0725   ALKPHOS 80 01/24/2023 0725   BILITOT 0.3 01/24/2023 0725   EGFR 95 10/27/2020 1038   GFRNONAA >60 01/24/2023 0725   GFRNONAA 103 07/10/2020 1033   CBC    Component Value Date/Time   WBC 4.6 01/24/2023 0725   RBC 4.09 (L) 01/24/2023 0725   HGB 11.9 (L) 01/24/2023 0725   HCT 36.9 (L) 01/24/2023 0725   PLT 367 01/24/2023 0725   MCV 90.2 01/24/2023 0725  MCH 29.1 01/24/2023 0725   MCHC 32.2 01/24/2023 0725   RDW 13.9 01/24/2023 0725   LYMPHSABS 2.3 01/23/2023 1629   MONOABS 0.4 01/23/2023 1629   EOSABS 0.1 01/23/2023 1629   BASOSABS 0.0 01/23/2023 1629   Respiratory viral PCR values were all negative. Procalcitonin, lactic acid, WBC were within normal levels and not concerning for acute bacterial pneumonia.  Labs pending at time of discharge were CD4 count, HIV viral load, PCP PCR.   CT CHEST WO CONTRAST CLINICAL DATA:  Shortness of breath, concern for pneumonia.  EXAM: CT CHEST WITHOUT CONTRAST  TECHNIQUE: Multidetector CT imaging of the chest was performed following the standard protocol without IV contrast.  RADIATION DOSE REDUCTION: This exam was performed according to the departmental dose-optimization program which includes automated exposure control, adjustment of the mA and/or kV according to patient size and/or use of iterative reconstruction technique.  COMPARISON:  Chest CT dated 11/22/2021 and same day chest radiograph.  FINDINGS: Cardiovascular: No significant vascular findings. Normal heart size. No pericardial effusion.  Mediastinum/Nodes: No enlarged mediastinal or axillary lymph nodes. Thyroid gland, trachea, and esophagus demonstrate no significant findings.  Lungs/Pleura: Paraseptal emphysematous changes are seen in the apices. Mild tree-in-bud opacities,  ground-glass opacities, and consolidation in the right middle lobe and lingula. There is minimal bibasilar atelectasis. No pleural effusion or pneumothorax.  Upper Abdomen: No acute abnormality.  Musculoskeletal: Degenerative changes are seen in the spine.  IMPRESSION: 1. Mild tree-in-bud opacities, ground-glass opacities, and consolidation in the right middle lobe and lingula are consistent with pneumonia.  Emphysema (ICD10-J43.9).  Electronically Signed   By: Romona Curls M.D.   On: 01/23/2023 17:15 DG Chest Port 1 View CLINICAL DATA:  Dyspnea  EXAM: PORTABLE CHEST 1 VIEW  COMPARISON:  Chest radiograph dated 12/17/2022.  FINDINGS: The heart size and mediastinal contours are within normal limits. Both lungs are clear. The visualized skeletal structures are unremarkable.  IMPRESSION: No active disease.  Electronically Signed   By: Romona Curls M.D.   On: 01/23/2023 17:10    Discharge Instructions:  It was a pleasure taking care of you at [facility]. You were admitted for acute asthma exacerbation and treated for asthma, HIV, schizoaffective disorder. We are discharging you home now that you are doing better. Please follow the following instructions.  1) Follow with your new PCP, Annett Fabian (11/25 at the internal medicine clinic here in the hospital) 2) Complete your 4 day course of prednisone to treat your acute asthma exacerbation. 3) Take your HIV medications as described. The [facility] should have given you recommendations. 4) Take your other psychiatric medications as recommended, please consider following up with the Behavioral Health Urgent Care if you would like to have support and help with your substance use goals.  Take care,  Dr Jaymes Graff, MD  Instructions from Psychiatry: Hello! You were seen by psychiatry this admission, and requested information on outpt resources. Best option for you (as  discussed) is upstairs from the urgent care. If you ever have suicidal thoughts, homicidal thoughts, an interest in rehab (congrats on cutting back!) or otherwise need urgent psychiatric care those services are downstairs - please don't hesitate to use them.   [place] funds mental health services for residents on medicaid or who are uninsured.   This is at the [facility]  [address], [place], [place] [address] [phone]  They have walk-in outpt appointments on the second floor  on weekday mornings (call to verify hours). These are first-come, first-serve, so try to get there early or you might get pushed to the next day.  On the first floor they have a 24/7 psychiatric urgent care for emergences and a unit equipped to take care of mild detox; they can also help link you to rehab facilities.  Outpatient Therapy Resources for Patients in [facility]:     Signed: Margaretmary Dys, MD 01/24/2023, 11:02 AM   Pager: [phone]

## 2023-01-24 NOTE — Progress Notes (Signed)
  CSW attempted to speak with pt about transportation and cell phone.  Pt eyes closed, does respond to questions but not engaged.  Pt reports he will DC to his aunts home, he can get there by bus, bus pass provided.  Attempted to discuss cell phone, pt says he is not sure where to buy one but he does have money to do so.  Minimal responses as CSW attempted to engage. Daleen Squibb, MSW, LCSW 11/11/202412:09 PM

## 2023-01-24 NOTE — Progress Notes (Signed)
Patient excited for DC.  TOC meds were picked up.  All questions answered.  Patient dc'd with bus pass provided by LCSW.

## 2023-01-24 NOTE — TOC Transition Note (Signed)
Transition of Care [facility]) - CM/SW Discharge Note   Patient Details  Name: John [name] MRN: [id number] Date of Birth: June 21, 1970  [facility]) CM/SW Contact:  Epifanio Lesches, RN Phone Number: 01/24/2023, 11:41 AM   Clinical Narrative:    Patient will DC to: Home  Anticipated DC date: 01/24/2023    Asthma with exacerbation  Per MD patient ready for DC today. RN, and patient notified of DC. Pt states homeless. Declined shelter/ resources. States has family in [place], might get in touch with them. Bus pass provided TOC. TOC pharmacy to fill RX meds . Post hospital f/u noted on AVS.  RNCM will sign off for now as intervention is no longer needed. Please consult [name] again if new needs arise.    Final next level of care: Home/Self Care Barriers to Discharge: No Barriers Identified   Patient Goals and CMS Choice      Discharge Placement                         Discharge Plan and Services Additional resources added to the After Visit Summary for                                       Social Determinants of Health (SDOH) Interventions SDOH Screenings   Food Insecurity: Patient Declined (01/24/2023)  Recent Concern: Food Insecurity - Food Insecurity Present (12/01/2022)  Housing: Low Risk  (01/24/2023)  Recent Concern: Housing - High Risk (12/17/2022)  Transportation Needs: No Transportation Needs (01/24/2023)  Recent Concern: Transportation Needs - Unmet Transportation Needs (12/17/2022)  Utilities: Not At Risk (01/24/2023)  Alcohol Screen: Low Risk  (02/26/2022)  Depression (PHQ2-9): Low Risk  (05/03/2022)  Recent Concern: Depression (PHQ2-9) - High Risk (02/25/2022)  Financial Resource Strain: High Risk (05/22/2020)  Physical Activity: Inactive (05/22/2020)  Social Connections: Moderately Isolated (05/22/2020)  Stress: Stress Concern Present (05/22/2020)  Tobacco Use: High Risk (01/23/2023)     Readmission Risk Interventions    12/20/2022    12:17 PM  Readmission Risk Prevention Plan  Transportation Screening Complete  HRI or Home Care Consult Complete  Social Work Consult for Recovery Care Planning/Counseling Complete  Palliative Care Screening Not Applicable  Medication Review Oceanographer) Complete
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
## 2023-01-24 NOTE — Plan of Care (Signed)

## 2023-01-24 NOTE — Plan of Care (Signed)
  Problem: Education: Goal: Knowledge of General Education information will improve Description: Including pain rating scale, medication(s)/side effects and non-pharmacologic comfort measures Outcome: Progressing   Problem: Clinical Measurements: Goal: Respiratory complications will improve Outcome: Progressing   Problem: Nutrition: Goal: Adequate nutrition will be maintained Outcome: Progressing   

## 2023-01-24 NOTE — TOC Benefit Eligibility Note (Signed)
Patient Product/process development scientist completed.    The patient is insured through [employer] [place].     Ran test claim for Biktarvy and the current 30 day co-pay is $0.00.   This test claim was processed through [facility]- copay amounts may vary at other pharmacies due to pharmacy/plan contracts, or as the patient moves through the different stages of their insurance plan.     Roland Earl, CPHT Pharmacy Technician III Certified Patient Advocate [facility] Patient Advocate Team Direct Number: [phone]  Fax: [phone]

## 2023-01-25 LAB — HIV-1 RNA QUANT-NO REFLEX-BLD
HIV 1 RNA Quant: <20 {copies}/mL
LOG10 HIV-1 RNA: UNDETERMINED {Log}

## 2023-01-25 LAB — CD4/CD8 (T-HELPER/T-SUPPRESSOR CELL)
CD4 absolute: 272 /uL — ABNORMAL LOW (ref 400–1790)
CD4%: 29.01 % — ABNORMAL LOW (ref 33–65)
CD8 T Cell Abs: 460 /uL (ref 190–1000)
CD8tox: 49.2 % — ABNORMAL HIGH (ref 12–40)
Ratio: 0.59 — ABNORMAL LOW (ref 1.0–3.0)
Total lymphocyte count: 936 /uL — ABNORMAL LOW (ref 1000–4000)

## 2023-01-27 LAB — CULTURE, RESPIRATORY W GRAM STAIN: Culture: NORMAL

## 2023-01-28 LAB — CULTURE, BLOOD (ROUTINE X 2)
Culture: NO GROWTH
Culture: NO GROWTH
Special Requests: ADEQUATE
Special Requests: ADEQUATE

## 2023-01-28 LAB — PNEUMOCYSTIS PCR: Result Pneumocystis PCR: NEGATIVE

## 2023-02-07 ENCOUNTER — Encounter: Payer: MEDICAID | Admitting: Student

## 2023-02-08 ENCOUNTER — Ambulatory Visit: Payer: MEDICAID | Admitting: Family

## 2023-06-08 ENCOUNTER — Other Ambulatory Visit: Payer: Self-pay

## 2023-06-08 ENCOUNTER — Encounter (HOSPITAL_COMMUNITY): Payer: Self-pay

## 2023-06-08 ENCOUNTER — Emergency Department (HOSPITAL_COMMUNITY)
Admission: EM | Admit: 2023-06-08 | Discharge: 2023-06-08 | Disposition: A | Discharge status: Home / Self Care | Payer: MEDICAID | Attending: Emergency Medicine | Admitting: Emergency Medicine

## 2023-06-08 ENCOUNTER — Emergency Department (HOSPITAL_COMMUNITY): Payer: MEDICAID

## 2023-06-08 DIAGNOSIS — J45901 Unspecified asthma with (acute) exacerbation: Secondary | ICD-10-CM | POA: Diagnosis not present

## 2023-06-08 DIAGNOSIS — Z21 Asymptomatic human immunodeficiency virus [HIV] infection status: Secondary | ICD-10-CM | POA: Diagnosis not present

## 2023-06-08 DIAGNOSIS — R0602 Shortness of breath: Secondary | ICD-10-CM | POA: Insufficient documentation

## 2023-06-08 DIAGNOSIS — J449 Chronic obstructive pulmonary disease, unspecified: Secondary | ICD-10-CM | POA: Insufficient documentation

## 2023-06-08 DIAGNOSIS — Z87891 Personal history of nicotine dependence: Secondary | ICD-10-CM | POA: Insufficient documentation

## 2023-06-08 LAB — CBC WITH DIFFERENTIAL/PLATELET
Abs Immature Granulocytes: 0.01 10*3/uL (ref 0.00–0.07)
Basophils Absolute: 0 10*3/uL (ref 0.0–0.1)
Basophils Relative: 0 %
Eosinophils Absolute: 0.2 10*3/uL (ref 0.0–0.5)
Eosinophils Relative: 4 %
HCT: 39.8 % (ref 39.0–52.0)
Hemoglobin: 12.8 g/dL — ABNORMAL LOW (ref 13.0–17.0)
Immature Granulocytes: 0 %
Lymphocytes Relative: 48 %
Lymphs Abs: 2.2 10*3/uL (ref 0.7–4.0)
MCH: 29.5 pg (ref 26.0–34.0)
MCHC: 32.2 g/dL (ref 30.0–36.0)
MCV: 91.7 fL (ref 80.0–100.0)
Monocytes Absolute: 0.6 10*3/uL (ref 0.1–1.0)
Monocytes Relative: 12 %
Neutro Abs: 1.6 10*3/uL — ABNORMAL LOW (ref 1.7–7.7)
Neutrophils Relative %: 36 %
Platelets: 368 10*3/uL (ref 150–400)
RBC: 4.34 MIL/uL (ref 4.22–5.81)
RDW: 12.6 % (ref 11.5–15.5)
WBC: 4.6 10*3/uL (ref 4.0–10.5)
nRBC: 0 % (ref 0.0–0.2)

## 2023-06-08 LAB — COMPREHENSIVE METABOLIC PANEL
ALT: 14 U/L (ref 0–44)
AST: 22 U/L (ref 15–41)
Albumin: 3.4 g/dL — ABNORMAL LOW (ref 3.5–5.0)
Alkaline Phosphatase: 81 U/L (ref 38–126)
Anion gap: 8 (ref 5–15)
BUN: 21 mg/dL — ABNORMAL HIGH (ref 6–20)
CO2: 26 mmol/L (ref 22–32)
Calcium: 8.9 mg/dL (ref 8.9–10.3)
Chloride: 106 mmol/L (ref 98–111)
Creatinine, Ser: 1.06 mg/dL (ref 0.61–1.24)
GFR, Estimated: >60 mL/min (ref >60)
Glucose, Bld: 165 mg/dL — ABNORMAL HIGH (ref 70–99)
Potassium: 4.2 mmol/L (ref 3.5–5.1)
Sodium: 140 mmol/L (ref 135–145)
Total Bilirubin: 0.6 mg/dL (ref 0.0–1.2)
Total Protein: 6.9 g/dL (ref 6.5–8.1)

## 2023-06-08 LAB — RESP PANEL BY RT-PCR (RSV, FLU A&B, COVID)  RVPGX2
Influenza A by PCR: NEGATIVE
Influenza B by PCR: NEGATIVE
Resp Syncytial Virus by PCR: NEGATIVE
SARS Coronavirus 2 by RT PCR: NEGATIVE

## 2023-06-08 MED ORDER — METHYLPREDNISOLONE SODIUM SUCC 125 MG IJ SOLR
125.0000 mg | Freq: Once | INTRAMUSCULAR | Status: AC
  Administered 2023-06-08: 125 mg via INTRAVENOUS
  Filled 2023-06-08: qty 2

## 2023-06-08 MED ORDER — ALBUTEROL SULFATE HFA 108 (90 BASE) MCG/ACT IN AERS
2.0000 | INHALATION_SPRAY | Freq: Four times a day (QID) | RESPIRATORY_TRACT | Status: DC
  Administered 2023-06-08: 2 via RESPIRATORY_TRACT
  Filled 2023-06-08: qty 6.7

## 2023-06-08 NOTE — ED Provider Notes (Signed)
 [facility] AT [facility] Provider Note   CSN: [id number] Arrival date & time: 06/08/23  1138     History  Chief Complaint  Patient presents with   Shortness of Breath    John [name] is a 53 y.o. male.  HPI Patient presents via EMS with creased work of breathing.  He acknowledges multiple medical problems including asthma, and continues to smoke cigarettes.  Today he had increased work of breathing, and sought help.  EMS reports the patient had decreased breath sounds with audible wheezing and increased work of breathing on arrival, but no hypoxia.  Patient received Solu-Medrol, DuoNeb in transport with some improvement. Patient denies chest pain, denies fever.    Home Medications Prior to Admission medications   Medication Sig Start Date End Date Taking? Authorizing Provider  albuterol (VENTOLIN HFA) 108 (90 Base) MCG/ACT inhaler Inhale 2 puffs into the lungs every 4 (four) hours as needed for wheezing or shortness of breath. 01/24/23   Dickie La, MD  bictegravir-emtricitabine-tenofovir AF (BIKTARVY) 50-200-25 MG TABS tablet Take 1 tablet by mouth daily. 01/25/23   Veryl Speak, FNP  budesonide-formoterol (SYMBICORT) 160-4.5 MCG/ACT inhaler Inhale 2 puffs into the lungs 2 (two) times daily. 01/24/23   Modena Slater, DO  busPIRone (BUSPAR) 5 MG tablet Take 1 tablet (5 mg total) by mouth 2 (two) times daily. 01/24/23   Modena Slater, DO  mirtazapine (REMERON) 15 MG tablet Take 1 tablet (15 mg total) by mouth at bedtime. 01/24/23 02/23/23  Modena Slater, DO  QUEtiapine (SEROQUEL XR) 300 MG 24 hr tablet Take 1 tablet (300 mg total) by mouth at bedtime. 01/24/23   Modena Slater, DO  traZODone (DESYREL) 50 MG tablet Take 1 tablet (50 mg total) by mouth at bedtime as needed for sleep (sleep difficulty). 01/24/23 02/23/23  Modena Slater, DO  dicyclomine (BENTYL) 20 MG tablet Take 1 tablet (20 mg total) by mouth 2 (two) times daily. 01/08/17 01/03/20  Palumbo,  April, MD  sucralfate (CARAFATE) 1 GM/10ML suspension Take 10 mLs (1 g total) by mouth 4 (four) times daily -  with meals and at bedtime. Patient not taking: Reported on 10/21/2017 01/08/17 09/03/18  Palumbo, April, MD      Allergies    Shellfish allergy    Review of Systems   Review of Systems  Physical Exam Updated Vital Signs BP 137/87   Pulse 89   Temp 98.2 F (36.8 C) (Axillary)   Resp 19   Ht 5\' 9"  (1.753 m)   Wt 68 kg   SpO2 99%   BMI 22.14 kg/m  Physical Exam Vitals and nursing note reviewed.  Constitutional:      General: He is not in acute distress.    Appearance: He is well-developed.  HENT:     Head: Normocephalic and atraumatic.  Eyes:     Conjunctiva/sclera: Conjunctivae normal.  Cardiovascular:     Rate and Rhythm: Regular rhythm. Tachycardia present.  Pulmonary:     Effort: Tachypnea, accessory muscle usage and respiratory distress present.     Breath sounds: No stridor. Decreased breath sounds and wheezing present.  Abdominal:     General: There is no distension.  Skin:    General: Skin is warm and dry.  Neurological:     Mental Status: He is alert and oriented to person, place, and time.     ED Results / Procedures / Treatments   Labs (all labs ordered are listed, but only abnormal results are displayed) Labs  Reviewed  COMPREHENSIVE METABOLIC PANEL - Abnormal; Notable for the following components:      Result Value   Glucose, Bld 165 (*)    BUN 21 (*)    Albumin 3.4 (*)    All other components within normal limits  CBC WITH DIFFERENTIAL/PLATELET - Abnormal; Notable for the following components:   Hemoglobin 12.8 (*)    Neutro Abs 1.6 (*)    All other components within normal limits  RESP PANEL BY RT-PCR (RSV, FLU A&B, COVID)  [id number]    EKG EKG Interpretation Date/Time:  Wednesday June 08 2023 11:50:19 EDT Ventricular Rate:  78 PR Interval:  134 QRS Duration:  86 QT Interval:  398 QTC Calculation: 454 R Axis:   -30  Text  Interpretation: Sinus rhythm Left axis deviation Anterior infarct, old Confirmed by Gerhard Munch [phone]) on 06/08/2023 1:49:43 PM  Radiology DG Chest Port 1 View Result Date: 06/08/2023 CLINICAL DATA:  Shortness of breath. EXAM: PORTABLE CHEST 1 VIEW COMPARISON:  01/23/2023. FINDINGS: Bilateral lung fields are clear. Bilateral costophrenic angles are clear. Normal cardio-mediastinal silhouette. No acute osseous abnormalities. The soft tissues are within normal limits. IMPRESSION: No active disease. Electronically Signed   By: Jules Schick M.D.   On: 06/08/2023 13:31    Procedures Procedures    Medications Ordered in ED Medications  albuterol (VENTOLIN HFA) 108 (90 Base) MCG/ACT inhaler 2 puff (has no administration in time range)  methylPREDNISolone sodium succinate (SOLU-MEDROL) 125 mg/2 mL injection 125 mg (125 mg Intravenous Given 06/08/23 1151)    ED Course/ Medical Decision Making/ A&P                                 Medical Decision Making Adult male presents in respiratory distress.  Patient's history of asthma and ongoing smoking suggest asthma exacerbation, COPD, pneumonia as considerations. Patient also known to have HIV, but he denies other systemic complaints or fever. Cardiac 90 sinus normal pulse ox 99% with supplemental oxygen abnormal  Amount and/or Complexity of Data Reviewed Independent Historian: EMS External Data Reviewed: notes. Labs: ordered. Decision-making details documented in ED Course. Radiology: ordered and independent interpretation performed. ECG/medicine tests: ordered and independent interpretation performed. Decision-making details documented in ED Course.  Risk Prescription drug management. Decision regarding hospitalization. Diagnosis or treatment significantly limited by social determinants of health.   2:09 PM Patient now resting on repeat exam, in no distress, work of breathing has normalized.  Given his reassuring findings here, no  evidence for flu, pneumonia, little evidence for bacteremia, sepsis, suspicion for asthma exacerbation primary.  Given his improvement here patient will be discharged with bronchodilator, outpatient follow-up.  Patient does have HIV, but has no evidence for decompensated state.        Final Clinical Impression(s) / ED Diagnoses Final diagnoses:  SOB (shortness of breath)    Rx / DC Orders ED Discharge Orders     None         Gerhard Munch, MD 06/08/23 1409

## 2023-06-08 NOTE — ED Triage Notes (Signed)
 Pt arrived via [facility] from [place] [facility] for SOB that started an hour ago. Per EMS,pt's lungs are tight and has ins and exp wheezes throughout. Pt states he had a syncopal episode today. EMS gave albuterol neb 10 mg and atrovent 0.5 mg.

## 2023-06-08 NOTE — ED Notes (Signed)
Patient verbalizes understanding of discharge instructions. Opportunity for questioning and answers were provided. Pt given bus pass; Pt discharged from ED.

## 2023-06-08 NOTE — ED Notes (Signed)
 Nt called CCMD to put pt on monitor

## 2023-06-08 NOTE — Discharge Instructions (Addendum)
 Please use the provided albuterol every 4 hours for the next 2 days.  You may then use it as needed.  Follow-up with the health and wellness clinic or with your primary care physician.

## 2023-07-26 NOTE — Progress Notes (Signed)
 The 10-year ASCVD risk score (Arnett DK, et al., 2019) is: 10.5%   Values used to calculate the score:     Age: 53 years     Sex: Male     Is Non-Hispanic African American: Yes     Diabetic: No     Tobacco smoker: Yes     Systolic Blood Pressure: 139 mmHg     Is BP treated: No     HDL Cholesterol: 44 mg/dL     Total Cholesterol: 131 mg/dL  Arlon Bergamo, BSN, RN

## 2023-09-15 ENCOUNTER — Emergency Department (HOSPITAL_COMMUNITY)
Admission: EM | Admit: 2023-09-15 | Discharge: 2023-09-15 | Disposition: A | Discharge status: Home / Self Care | Payer: MEDICAID

## 2023-09-15 ENCOUNTER — Emergency Department (HOSPITAL_COMMUNITY): Payer: MEDICAID

## 2023-09-15 ENCOUNTER — Other Ambulatory Visit: Payer: Self-pay

## 2023-09-15 ENCOUNTER — Encounter (HOSPITAL_COMMUNITY): Payer: Self-pay | Admitting: Emergency Medicine

## 2023-09-15 DIAGNOSIS — J4541 Moderate persistent asthma with (acute) exacerbation: Secondary | ICD-10-CM | POA: Diagnosis not present

## 2023-09-15 DIAGNOSIS — J45901 Unspecified asthma with (acute) exacerbation: Secondary | ICD-10-CM

## 2023-09-15 DIAGNOSIS — R0602 Shortness of breath: Secondary | ICD-10-CM | POA: Diagnosis present

## 2023-09-15 LAB — BASIC METABOLIC PANEL WITH GFR
Anion gap: 11 (ref 5–15)
BUN: 22 mg/dL — ABNORMAL HIGH (ref 6–20)
CO2: 23 mmol/L (ref 22–32)
Calcium: 8.2 mg/dL — ABNORMAL LOW (ref 8.9–10.3)
Chloride: 106 mmol/L (ref 98–111)
Creatinine, Ser: 0.99 mg/dL (ref 0.61–1.24)
GFR, Estimated: >60 mL/min (ref >60)
Glucose, Bld: 110 mg/dL — ABNORMAL HIGH (ref 70–99)
Potassium: 3.7 mmol/L (ref 3.5–5.1)
Sodium: 140 mmol/L (ref 135–145)

## 2023-09-15 LAB — CBC WITH DIFFERENTIAL/PLATELET
Abs Immature Granulocytes: 0.01 10*3/uL (ref 0.00–0.07)
Basophils Absolute: 0 10*3/uL (ref 0.0–0.1)
Basophils Relative: 0 %
Eosinophils Absolute: 0.1 10*3/uL (ref 0.0–0.5)
Eosinophils Relative: 3 %
HCT: 37.1 % — ABNORMAL LOW (ref 39.0–52.0)
Hemoglobin: 11.9 g/dL — ABNORMAL LOW (ref 13.0–17.0)
Immature Granulocytes: 0 %
Lymphocytes Relative: 28 %
Lymphs Abs: 1.2 10*3/uL (ref 0.7–4.0)
MCH: 30 pg (ref 26.0–34.0)
MCHC: 32.1 g/dL (ref 30.0–36.0)
MCV: 93.5 fL (ref 80.0–100.0)
Monocytes Absolute: 0.2 10*3/uL (ref 0.1–1.0)
Monocytes Relative: 5 %
Neutro Abs: 2.9 10*3/uL (ref 1.7–7.7)
Neutrophils Relative %: 64 %
Platelets: 300 10*3/uL (ref 150–400)
RBC: 3.97 MIL/uL — ABNORMAL LOW (ref 4.22–5.81)
RDW: 13.2 % (ref 11.5–15.5)
WBC: 4.5 10*3/uL (ref 4.0–10.5)
nRBC: 0 % (ref 0.0–0.2)

## 2023-09-15 LAB — TROPONIN I (HIGH SENSITIVITY): Troponin I (High Sensitivity): 7 ng/L (ref <18)

## 2023-09-15 LAB — BRAIN NATRIURETIC PEPTIDE: B Natriuretic Peptide: 86.9 pg/mL (ref 0.0–100.0)

## 2023-09-15 MED ORDER — ALBUTEROL SULFATE HFA 108 (90 BASE) MCG/ACT IN AERS: 1.0000 | INHALATION_SPRAY | Freq: Four times a day (QID) | RESPIRATORY_TRACT | 0 refills | Status: DC | PRN

## 2023-09-15 MED ORDER — PREDNISONE 10 MG PO TABS: 40.0000 mg | ORAL_TABLET | Freq: Every day | ORAL | 0 refills | Status: AC

## 2023-09-15 NOTE — Discharge Instructions (Signed)
 Take your steroids as prescribed.  Use your inhaler as needed.  Return to the ER for new or worsening otherwise follow-up with your primary care doctor.

## 2023-09-15 NOTE — ED Provider Notes (Signed)
 [place] EMERGENCY DEPARTMENT AT [facility] Provider Note   CSN: [id number] Arrival date & time: 09/15/23  1435     Patient presents with: Shortness of Breath   John [name] is a 53 y.o. male.   53 year old male presents for evaluation of asthma attack.  States he ran out of his albuterol  inhalers.  He was given nebulizer steroids and mag by EMS.  States he is feeling much improved at this time.  Room air sats were initially 89% when EMS found him.  He states it started today.  States he has more frequent coughing.  Denies any other symptoms or concerns.   Shortness of Breath Associated symptoms: cough   Associated symptoms: no abdominal pain, no chest pain, no ear pain, no fever, no rash, no sore throat and no vomiting        Prior to Admission medications   Medication Sig Start Date End Date Taking? Authorizing Provider  albuterol  (VENTOLIN  HFA) 108 (90 Base) MCG/ACT inhaler Inhale 1-2 puffs into the lungs every 6 (six) hours as needed for wheezing or shortness of breath. 09/15/23 10/15/23 Yes Ivone Licht L, DO  predniSONE  (DELTASONE ) 10 MG tablet Take 4 tablets (40 mg total) by mouth daily for 4 days. 09/15/23 09/19/23 Yes Maguadalupe Lata L, DO  albuterol  (VENTOLIN  HFA) 108 (90 Base) MCG/ACT inhaler Inhale 2 puffs into the lungs every 4 (four) hours as needed for wheezing or shortness of breath. 01/24/23   Karna Fellows, MD  bictegravir-emtricitabine -tenofovir  AF (BIKTARVY ) 50-200-25 MG TABS tablet Take 1 tablet by mouth daily. 01/25/23   Calone, Gregory D, FNP  budesonide -formoterol  (SYMBICORT ) 160-4.5 MCG/ACT inhaler Inhale 2 puffs into the lungs 2 (two) times daily. 01/24/23   Tobie Gaines, DO  busPIRone  (BUSPAR ) 5 MG tablet Take 1 tablet (5 mg total) by mouth 2 (two) times daily. 01/24/23   Tobie Gaines, DO  mirtazapine  (REMERON ) 15 MG tablet Take 1 tablet (15 mg total) by mouth at bedtime. 01/24/23 02/23/23  Tobie Gaines, DO  QUEtiapine  (SEROQUEL  XR) 300 MG 24 hr tablet  Take 1 tablet (300 mg total) by mouth at bedtime. 01/24/23   Tobie Gaines, DO  traZODone  (DESYREL ) 50 MG tablet Take 1 tablet (50 mg total) by mouth at bedtime as needed for sleep (sleep difficulty). 01/24/23 02/23/23  Tobie Gaines, DO  dicyclomine  (BENTYL ) 20 MG tablet Take 1 tablet (20 mg total) by mouth 2 (two) times daily. 01/08/17 01/03/20  Palumbo, April, MD  sucralfate  (CARAFATE ) 1 GM/10ML suspension Take 10 mLs (1 g total) by mouth 4 (four) times daily -  with meals and at bedtime. Patient not taking: Reported on 10/21/2017 01/08/17 09/03/18  Palumbo, April, MD    Allergies: Shellfish allergy    Review of Systems  Constitutional:  Negative for chills and fever.  HENT:  Negative for ear pain and sore throat.   Eyes:  Negative for pain and visual disturbance.  Respiratory:  Positive for cough and shortness of breath.   Cardiovascular:  Negative for chest pain and palpitations.  Gastrointestinal:  Negative for abdominal pain and vomiting.  Genitourinary:  Negative for dysuria and hematuria.  Musculoskeletal:  Negative for arthralgias and back pain.  Skin:  Negative for color change and rash.  Neurological:  Negative for seizures and syncope.  All other systems reviewed and are negative.   Updated Vital Signs BP (!) 131/98   Pulse 67   Temp 98.4 F (36.9 C) (Oral)   Resp 20   SpO2 99%  Physical Exam Vitals and nursing note reviewed.  Constitutional:      General: He is not in acute distress.    Appearance: He is well-developed. He is not ill-appearing.  HENT:     Head: Normocephalic and atraumatic.  Eyes:     Conjunctiva/sclera: Conjunctivae normal.  Cardiovascular:     Rate and Rhythm: Normal rate and regular rhythm.     Heart sounds: No murmur heard. Pulmonary:     Effort: Pulmonary effort is normal. No tachypnea, bradypnea, accessory muscle usage or respiratory distress.     Breath sounds: Normal breath sounds. No decreased breath sounds, wheezing, rhonchi or rales.   Abdominal:     Palpations: Abdomen is soft.     Tenderness: There is no abdominal tenderness.  Musculoskeletal:        General: No swelling.     Cervical back: Neck supple.  Skin:    General: Skin is warm and dry.     Capillary Refill: Capillary refill takes less than 2 seconds.  Neurological:     General: No focal deficit present.     Mental Status: He is alert.  Psychiatric:        Mood and Affect: Mood normal.     (all labs ordered are listed, but only abnormal results are displayed) Labs Reviewed  CBC WITH DIFFERENTIAL/PLATELET - Abnormal; Notable for the following components:      Result Value   RBC 3.97 (*)    Hemoglobin 11.9 (*)    HCT 37.1 (*)    All other components within normal limits  BASIC METABOLIC PANEL WITH GFR - Abnormal; Notable for the following components:   Glucose, Bld 110 (*)    BUN 22 (*)    Calcium 8.2 (*)    All other components within normal limits  BRAIN NATRIURETIC PEPTIDE  TROPONIN I (HIGH SENSITIVITY)  TROPONIN I (HIGH SENSITIVITY)    EKG: EKG Interpretation Date/Time:  Thursday September 15 2023 14:48:38 EDT Ventricular Rate:  69 PR Interval:  137 QRS Duration:  92 QT Interval:  419 QTC Calculation: 449 R Axis:   12  Text Interpretation: Sinus rhythm Nonspecific ST abnormality Normal intervals No STEMI No acute change when compared with prior EKG from 06/08/2023 Confirmed by Gennaro Bouchard ([id number]) on 09/15/2023 3:18:15 PM  Radiology: [facility] Chest 1 View Result Date: 09/15/2023 CLINICAL DATA:  Shortness of breath. EXAM: CHEST  1 VIEW COMPARISON:  06/08/2023. FINDINGS: Stable cardiomediastinal silhouette. Mild bilateral central peribronchial cuffing. No focal consolidation, pleural effusion, or pneumothorax. No acute osseous abnormality. IMPRESSION: Mild bilateral central peribronchial cuffing, which can be seen in the setting of asthma or bronchitis. No focal consolidation. Electronically Signed   By: Harrietta Sherry M.D.   On: 09/15/2023 16:04      Procedures   Medications Ordered in the ED - No data to display                                  Medical Decision Making Medical Decision Making Nursing notes are reviewed. Differential diagnosis for this patient would include but not limited to: Asthma exacerbation, COPD, bronchitis, viral URI, other  Cardiac monitor interpretation: Sinus rhythm, no ectopy  Emergency Department Course:  Vital signs and pulse oximetry are reviewed, evaluated by myself and found to be within normal limits prior to final disposition. Findings of laboratory testing and medical imaging are discussed with patient and family that is available. Patient  agrees with the medical care plan as follows:  Patient's lab workup reviewed and unremarkable.  Imaging as above.  He was 89% on room air for EMS was given multiple breathing treatments and steroids and mag and he is feeling much much better prior to my evaluation as vital signs are stable.  He ran out of his albuterol  and I will refill this.  Will also give him prescription for steroids.  Advise close up with his primary care doctor otherwise return to the ER for any worsening symptoms.  He feels comfortable being discharged home.  Problems Addressed: Moderate asthma with exacerbation, unspecified whether persistent: acute illness or injury that poses a threat to life or bodily functions  Amount and/or Complexity of Data Reviewed External Data Reviewed: notes.    Details: Prior ED records reviewed and patient was seen in March 2025 for similar symptoms Labs: ordered. Decision-making details documented in ED Course.    Details: Lab workup ordered and reviewed by me and unremarkable.  Troponins and BNP are negative Radiology: ordered and independent interpretation performed. Decision-making details documented in ED Course.    Details: Chest x-ray reviewed and interpreted independently of radiology and shows evidence of bronchitis but no other acute  disease ECG/medicine tests: ordered and independent interpretation performed. Decision-making details documented in ED Course.    Details: Interpreted by me in the absence of cardiology shows sinus rhythm, no STEMI no acute change compared to prior EKG  Risk OTC drugs. Prescription drug management. Drug therapy requiring intensive monitoring for toxicity.     Final diagnoses:  Moderate asthma with exacerbation, unspecified whether persistent    ED Discharge Orders          Ordered    albuterol  (VENTOLIN  HFA) 108 (90 Base) MCG/ACT inhaler  Every 6 hours PRN        09/15/23 1702    predniSONE  (DELTASONE ) 10 MG tablet  Daily        09/15/23 1702               Kareem Aul, Duwaine CROME, DO 09/15/23 2035

## 2023-09-15 NOTE — ED Triage Notes (Signed)
 [place] about 1 [place] , hx of asthma sats 89 % on room air  , pt received  2 mg mag , 2 nebs and 125 mg solumedrol by ems ,

## 2023-09-15 NOTE — ED Notes (Signed)
 Help get patient into a gown on the monitor did EKG shown to er provider patient is resting with call bell

## 2023-10-28 ENCOUNTER — Emergency Department (HOSPITAL_COMMUNITY)
Admission: EM | Admit: 2023-10-28 | Discharge: 2023-10-28 | Discharge status: Against Medical Advice | Payer: MEDICAID | Attending: Emergency Medicine | Admitting: Emergency Medicine

## 2023-10-28 ENCOUNTER — Encounter (HOSPITAL_COMMUNITY): Payer: Self-pay | Admitting: *Deleted

## 2023-10-28 ENCOUNTER — Other Ambulatory Visit: Payer: Self-pay

## 2023-10-28 ENCOUNTER — Emergency Department (HOSPITAL_COMMUNITY): Payer: MEDICAID

## 2023-10-28 DIAGNOSIS — Z5321 Procedure and treatment not carried out due to patient leaving prior to being seen by health care provider: Secondary | ICD-10-CM | POA: Diagnosis not present

## 2023-10-28 DIAGNOSIS — R059 Cough, unspecified: Secondary | ICD-10-CM | POA: Diagnosis not present

## 2023-10-28 DIAGNOSIS — J45909 Unspecified asthma, uncomplicated: Secondary | ICD-10-CM | POA: Diagnosis not present

## 2023-10-28 DIAGNOSIS — R0602 Shortness of breath: Secondary | ICD-10-CM | POA: Diagnosis present

## 2023-10-28 LAB — CBC WITH DIFFERENTIAL/PLATELET
Abs Immature Granulocytes: 0.01 K/uL (ref 0.00–0.07)
Basophils Absolute: 0 K/uL (ref 0.0–0.1)
Basophils Relative: 1 %
Eosinophils Absolute: 0.2 K/uL (ref 0.0–0.5)
Eosinophils Relative: 5 %
HCT: 43.4 % (ref 39.0–52.0)
Hemoglobin: 13.5 g/dL (ref 13.0–17.0)
Immature Granulocytes: 0 %
Lymphocytes Relative: 51 %
Lymphs Abs: 2.3 K/uL (ref 0.7–4.0)
MCH: 29.7 pg (ref 26.0–34.0)
MCHC: 31.1 g/dL (ref 30.0–36.0)
MCV: 95.6 fL (ref 80.0–100.0)
Monocytes Absolute: 0.6 K/uL (ref 0.1–1.0)
Monocytes Relative: 14 %
Neutro Abs: 1.3 K/uL — ABNORMAL LOW (ref 1.7–7.7)
Neutrophils Relative %: 29 %
Platelets: 338 K/uL (ref 150–400)
RBC: 4.54 MIL/uL (ref 4.22–5.81)
RDW: 12.8 % (ref 11.5–15.5)
WBC: 4.4 K/uL (ref 4.0–10.5)
nRBC: 0 % (ref 0.0–0.2)

## 2023-10-28 LAB — COMPREHENSIVE METABOLIC PANEL WITH GFR
ALT: 17 U/L (ref 0–44)
AST: 26 U/L (ref 15–41)
Albumin: 3.6 g/dL (ref 3.5–5.0)
Alkaline Phosphatase: 79 U/L (ref 38–126)
Anion gap: 10 (ref 5–15)
BUN: 27 mg/dL — ABNORMAL HIGH (ref 6–20)
CO2: 22 mmol/L (ref 22–32)
Calcium: 8.9 mg/dL (ref 8.9–10.3)
Chloride: 112 mmol/L — ABNORMAL HIGH (ref 98–111)
Creatinine, Ser: 1.17 mg/dL (ref 0.61–1.24)
GFR, Estimated: >60 mL/min (ref >60)
Glucose, Bld: 85 mg/dL (ref 70–99)
Potassium: 4.8 mmol/L (ref 3.5–5.1)
Sodium: 144 mmol/L (ref 135–145)
Total Bilirubin: 0.5 mg/dL (ref 0.0–1.2)
Total Protein: 7.1 g/dL (ref 6.5–8.1)

## 2023-10-28 LAB — RESP PANEL BY RT-PCR (RSV, FLU A&B, COVID)  RVPGX2
Influenza A by PCR: NEGATIVE
Influenza B by PCR: NEGATIVE
Resp Syncytial Virus by PCR: NEGATIVE
SARS Coronavirus 2 by RT PCR: NEGATIVE

## 2023-10-28 MED ORDER — ALBUTEROL SULFATE HFA 108 (90 BASE) MCG/ACT IN AERS
2.0000 | INHALATION_SPRAY | RESPIRATORY_TRACT | Status: DC
  Administered 2023-10-28: 2 via RESPIRATORY_TRACT
  Filled 2023-10-28: qty 13.4

## 2023-10-28 NOTE — ED Triage Notes (Signed)
 Sob  today the pt ran out of his inhaler for 1-2 weeks  inhaler albuterol  given now and he has had an almost instant improvement with 2 puffs hx asthma

## 2023-10-28 NOTE — ED Provider Triage Note (Signed)
 Emergency Medicine Provider Triage Evaluation Note  John [name] , a 53 y.o. male  was evaluated in triage.  Pt complains of shortness of breath.  Patient with past history significant for asthma.  Reports he has been using his inhalers more frequently but denies any significant relief early patient with symptoms.  No reported fever but does endorse a slight productive cough.  Denies any nausea, vomiting, or diarrhea.  Review of Systems  Positive: As above Negative: As above  Physical Exam  BP (!) 142/94 (BP Location: Right Arm)   Pulse 84   Temp 98 F (36.7 C) (Oral)   Resp (!) 24   SpO2 100%  Gen:   Awake, no distress  Resp:  Normal effort, diffuse wheezing in all lung fields, no appreciable rales or rhonchi MSK:   Moves extremities without difficulty  Other:    Medical Decision Making  Medically screening exam initiated at 3:57 PM.  Appropriate orders placed.  John [name] was informed that the remainder of the evaluation will be completed by another provider, this initial triage assessment does not replace that evaluation, and the importance of remaining in the ED until their evaluation is complete.     Windie Marasco A, PA-C 10/28/23 1558

## 2023-10-28 NOTE — ED Notes (Signed)
 Pt not answering to multiple calls for vitals

## 2023-10-30 ENCOUNTER — Emergency Department (HOSPITAL_COMMUNITY): Payer: MEDICAID

## 2023-10-30 ENCOUNTER — Encounter (HOSPITAL_COMMUNITY): Payer: Self-pay

## 2023-10-30 ENCOUNTER — Emergency Department (HOSPITAL_COMMUNITY)
Admission: EM | Admit: 2023-10-30 | Discharge: 2023-10-31 | Disposition: A | Discharge status: Home / Self Care | Payer: MEDICAID | Attending: Emergency Medicine | Admitting: Emergency Medicine

## 2023-10-30 ENCOUNTER — Other Ambulatory Visit: Payer: Self-pay

## 2023-10-30 DIAGNOSIS — R45850 Homicidal ideations: Secondary | ICD-10-CM | POA: Diagnosis present

## 2023-10-30 DIAGNOSIS — J45901 Unspecified asthma with (acute) exacerbation: Secondary | ICD-10-CM | POA: Diagnosis not present

## 2023-10-30 DIAGNOSIS — I10 Essential (primary) hypertension: Secondary | ICD-10-CM | POA: Diagnosis not present

## 2023-10-30 DIAGNOSIS — F200 Paranoid schizophrenia: Secondary | ICD-10-CM | POA: Diagnosis not present

## 2023-10-30 DIAGNOSIS — R062 Wheezing: Secondary | ICD-10-CM

## 2023-10-30 DIAGNOSIS — R45851 Suicidal ideations: Secondary | ICD-10-CM | POA: Diagnosis present

## 2023-10-30 DIAGNOSIS — T43506A Underdosing of unspecified antipsychotics and neuroleptics, initial encounter: Secondary | ICD-10-CM | POA: Diagnosis not present

## 2023-10-30 DIAGNOSIS — Z79899 Other long term (current) drug therapy: Secondary | ICD-10-CM | POA: Diagnosis not present

## 2023-10-30 DIAGNOSIS — R0602 Shortness of breath: Secondary | ICD-10-CM | POA: Diagnosis present

## 2023-10-30 DIAGNOSIS — Z21 Asymptomatic human immunodeficiency virus [HIV] infection status: Secondary | ICD-10-CM | POA: Insufficient documentation

## 2023-10-30 DIAGNOSIS — F1210 Cannabis abuse, uncomplicated: Secondary | ICD-10-CM | POA: Diagnosis not present

## 2023-10-30 DIAGNOSIS — Z5900 Homelessness unspecified: Secondary | ICD-10-CM | POA: Diagnosis not present

## 2023-10-30 DIAGNOSIS — Z91128 Patient's intentional underdosing of medication regimen for other reason: Secondary | ICD-10-CM | POA: Diagnosis not present

## 2023-10-30 DIAGNOSIS — F1410 Cocaine abuse, uncomplicated: Secondary | ICD-10-CM | POA: Diagnosis not present

## 2023-10-30 LAB — RAPID URINE DRUG SCREEN, HOSP PERFORMED
Amphetamines: NOT DETECTED
Barbiturates: NOT DETECTED
Benzodiazepines: NOT DETECTED
Cocaine: POSITIVE — AB
Opiates: NOT DETECTED
Tetrahydrocannabinol: POSITIVE — AB

## 2023-10-30 LAB — CBC
HCT: 39.5 % (ref 39.0–52.0)
Hemoglobin: 12.3 g/dL — ABNORMAL LOW (ref 13.0–17.0)
MCH: 29.8 pg (ref 26.0–34.0)
MCHC: 31.1 g/dL (ref 30.0–36.0)
MCV: 95.6 fL (ref 80.0–100.0)
Platelets: 314 K/uL (ref 150–400)
RBC: 4.13 MIL/uL — ABNORMAL LOW (ref 4.22–5.81)
RDW: 12.7 % (ref 11.5–15.5)
WBC: 4.1 K/uL (ref 4.0–10.5)
nRBC: 0 % (ref 0.0–0.2)

## 2023-10-30 LAB — BASIC METABOLIC PANEL WITH GFR
Anion gap: 8 (ref 5–15)
BUN: 23 mg/dL — ABNORMAL HIGH (ref 6–20)
CO2: 22 mmol/L (ref 22–32)
Calcium: 8.9 mg/dL (ref 8.9–10.3)
Chloride: 109 mmol/L (ref 98–111)
Creatinine, Ser: 1.21 mg/dL (ref 0.61–1.24)
GFR, Estimated: >60 mL/min (ref >60)
Glucose, Bld: 90 mg/dL (ref 70–99)
Potassium: 4.4 mmol/L (ref 3.5–5.1)
Sodium: 139 mmol/L (ref 135–145)

## 2023-10-30 MED ORDER — IPRATROPIUM-ALBUTEROL 0.5-2.5 (3) MG/3ML IN SOLN
3.0000 mL | Freq: Once | RESPIRATORY_TRACT | Status: AC
  Administered 2023-10-30: 3 mL via RESPIRATORY_TRACT
  Filled 2023-10-30: qty 3

## 2023-10-30 MED ORDER — PREDNISONE 20 MG PO TABS
60.0000 mg | ORAL_TABLET | Freq: Once | ORAL | Status: AC
  Administered 2023-10-30: 60 mg via ORAL
  Filled 2023-10-30: qty 3

## 2023-10-30 MED ORDER — ALBUTEROL SULFATE HFA 108 (90 BASE) MCG/ACT IN AERS
2.0000 | INHALATION_SPRAY | RESPIRATORY_TRACT | Status: DC | PRN
  Administered 2023-10-30: 2 via RESPIRATORY_TRACT
  Filled 2023-10-30: qty 6.7

## 2023-10-30 MED ORDER — ALBUTEROL SULFATE HFA 108 (90 BASE) MCG/ACT IN AERS: 1.0000 | INHALATION_SPRAY | Freq: Four times a day (QID) | RESPIRATORY_TRACT | 0 refills | Status: DC | PRN

## 2023-10-30 NOTE — Discharge Instructions (Signed)
 You were seen today for wheezing. While you were here we monitored your vitals, preformed a physical exam, and labs and chest x-ray and EKG. These were all reassuring and there is no indication for any further testing or intervention in the emergency department at this time.   Things to do:  - Follow up with your primary care provider within the next 1-2 weeks - Please use the albuterol  inhaler as needed for wheezing and follow-up with your primary care doctor  Return to the emergency department if you have any new or worsening symptoms including worsening shortness of breath, leg swelling, inability to lay flat, chest pain, or if you have any other concerns.

## 2023-10-30 NOTE — ED Notes (Signed)
 Dinner tray given

## 2023-10-30 NOTE — ED Provider Notes (Cosign Needed Addendum)
 [place] EMERGENCY DEPARTMENT AT [facility] Provider Note  MDM   HPI/ROS:  John [name] is a 53 y.o. male with a medical history as below who presents with shortness of breath worsening since yesterday.  Patient reports a history of asthma and ran out of his inhaler.  He believes that he does triggering his asthma.  States he has noticed wheezing.  Denies any recent fever chills cough, painful breathing, leg swelling or orthopnea.  Physical exam is notable for: - Mild diffuse expiratory wheezing bilateral lung bases breathing easily with a DuoNeb, no increased work or retractions  On my initial evaluation, patient is:  -Vital signs stable.  With tachypnea.  Patient afebrile, hemodynamically stable, and non-toxic appearing. -Additional history obtained from   This patient's current presentation, including their history and physical exam, is most consistent with asthma exacerbation. Differentials include asthma, COPD, CHF, ACS, PE, dissection, pneumonia, viral.    Physical exam is reassuring as patient has been not requiring any supplemental O2, has no significant increased work of breathing or respiratory distress.  He does have some mild wheezing bilaterally concerning for asthma exacerbation.  DuoNebs and steroids given.  On reassessment he is much improved.  His wheezing is minimal.  He looks more comfortable and is resting easily.  Still no O2 requirement.  CXR without pneumonia, defer antibiotics.  Appropriate for discharge with albuterol  inhaler close follow-up and strict return precautions.    At the time of discharge patient reporting thoughts of self-harm.  States he has been off his psych meds for some time.  Endorsing visual hallucinations of blue lightning bugs.  No auditory hallucinations.  He states he plans to hurt himself by any means necessary.  Does not have a plan for such.  Denies access to firearms.  States that he has had similar thoughts before about 7  months ago but thoughts began about 20 minutes ago when he was told he would be discharged.  Given the significance and persistence of his SI to believe psychiatry assessment is warranted.  See their note for details.  Recommendations are pending at the time of shift change.  He is medically cleared for discharge if cleared from a psychiatric perspective by psych team.  Interpretations, interventions, and the patient's course of care are documented below.    Clinical Course as of 10/30/23 1507  Sun Oct 30, 2023  1506 BMP without electrolyte abnormality, renal function intact [RC]  1506 CBC generally unremarkable aside from some very mild anemia [RC]  1506 DG Chest 2 View Consistent with asthma or bronchitis [RC]  1507 ED EKG NSR with normal axis and intervals no ST or depression.  No TWI nonischemic EKG [RC]    Clinical Course User Index [RC] Sharyne Darina RAMAN, MD      Disposition:  I discussed the plan for discharge with the patient and/or their surrogate at bedside prior to discharge and they were in agreement with the plan and verbalized understanding of the return precautions provided. All questions answered to the best of my ability. Ultimately, the patient was discharged in stable condition with stable vital signs. I am reassured that they are capable of close follow up and good social support at home.   Clinical Impression:  1. Mild asthma with acute exacerbation, unspecified whether persistent   2. Wheezing     Rx / DC Orders ED Discharge Orders          Ordered    albuterol  (VENTOLIN  HFA) 108 (90  Base) MCG/ACT inhaler  Every 6 hours PRN        10/30/23 1641            The plan for this patient was discussed with Dr. Yolande, who voiced agreement and who oversaw evaluation and treatment of this patient.   Clinical Complexity A medically appropriate history, review of systems, and physical exam was performed.  My independent interpretations of EKG, labs, and  radiology are documented in the ED course above.   If decision rules were used in this patient's evaluation, they are listed below.   Click here for ABCD2, HEART and other calculatorsREFRESH Note before signing   Patient's presentation is most consistent with exacerbation of chronic illness.  Medical Decision Making Amount and/or Complexity of Data Reviewed Labs: ordered. Radiology: ordered. Decision-making details documented in ED Course. ECG/medicine tests:  Decision-making details documented in ED Course.  Risk Prescription drug management.    HPI/ROS      See MDM section for pertinent HPI and ROS. A complete ROS was performed with pertinent positives/negatives noted above.   Past Medical History:  Diagnosis Date   Acute hypoxemic respiratory failure (HCC) 05/07/2021   Adjustment disorder with depressed mood 10/28/2015   Alcoholism (HCC)    Asthma    Bipolar disorder (HCC)    with depression/anxiety   Cannabis use disorder, moderate, dependence (HCC) 04/02/2015   Chronic low back pain    Cocaine use disorder (HCC) 04/02/2015   Gout    HIV (human immunodeficiency virus infection) (HCC)    dx'd ~ 2 yr ago (09/29/2012)   Homelessness    Hypertension    Polysubstance abuse (HCC) 04/11/2019    Past Surgical History:  Procedure Laterality Date   SKIN GRAFT FULL THICKNESS LEG Left ?   POSTERIOR LEFT LEG  AFTER DOG BITES      Physical Exam   Vitals:   10/30/23 1400 10/30/23 1401 10/30/23 1520 10/30/23 1640  BP: 138/69  (!) 146/91 (!) 144/87  Pulse: 86  94 89  Resp: (!) 26  (!) 28 (!) 23  Temp: 98 F (36.7 C)  98.3 F (36.8 C) 98.2 F (36.8 C)  TempSrc: Oral  Oral Oral  SpO2: 99%  99% 99%  Weight:  68 kg    Height:  5' 9 (1.753 m)      Physical Exam Vitals and nursing note reviewed.  Constitutional:      General: He is not in acute distress.    Appearance: He is well-developed.  HENT:     Head: Normocephalic and atraumatic.  Eyes:      Conjunctiva/sclera: Conjunctivae normal.  Cardiovascular:     Rate and Rhythm: Normal rate and regular rhythm.     Heart sounds: No murmur heard. Pulmonary:     Effort: Pulmonary effort is normal. Tachypnea present. No accessory muscle usage, respiratory distress or retractions.     Breath sounds: Examination of the right-lower field reveals wheezing. Examination of the left-lower field reveals wheezing. Wheezing present. No rales.  Abdominal:     Palpations: Abdomen is soft.     Tenderness: There is no abdominal tenderness.  Musculoskeletal:        General: No swelling.     Cervical back: Neck supple.     Right lower leg: No edema.     Left lower leg: No edema.  Skin:    General: Skin is warm and dry.     Capillary Refill: Capillary refill takes less than 2 seconds.  Neurological:     Mental Status: He is alert.  Psychiatric:        Mood and Affect: Mood normal.      Procedures   If procedures were preformed on this patient, they are listed below:  Procedures   @BBSIG @   Please note that this documentation was produced with the assistance of voice-to-text technology and may contain errors.    Sharyne Darina RAMAN, MD 10/30/23 1642    Sharyne Darina RAMAN, MD 10/30/23 2337    Yolande Lamar BROCKS, MD 10/31/23 [phone]
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
## 2023-10-30 NOTE — ED Triage Notes (Addendum)
 Pt c/o SHOB, states his asthma is acting up and he is out of his albuterol  inhaler. Pt c/o chest tightness. Pt states he has had thoughts of dying, denies thoughts of killing himself. Denies HI, pt does have auditory and visual hallucinations at times, denies at this time.

## 2023-10-30 NOTE — ED Provider Triage Note (Signed)
 Emergency Medicine Provider Triage Evaluation Note  John [name] , a 53 y.o. male  was evaluated in triage.  Pt complains of SOB.  Review of Systems  Positive:  Negative:   Physical Exam  BP 138/69   Pulse 86   Temp 98 F (36.7 C) (Oral)   Resp (!) 26   Ht 5' 9 (1.753 m)   Wt 68 kg   SpO2 99%   BMI 22.14 kg/m  Gen:   Awake, no distress   Resp:  Normal effort  MSK:   Moves extremities without difficulty  Other:    Medical Decision Making  Medically screening exam initiated at 2:40 PM.  Appropriate orders placed.  John [name] was informed that the remainder of the evaluation will be completed by another provider, this initial triage assessment does not replace that evaluation, and the importance of remaining in the ED until their evaluation is complete.  Patient has been out of his albuterol  inhaler for at least 1 month.  He is having worsening shortness of breath/chest tightness.  Physical exam with expiratory wheezing.  I will provide patient with another breathing treatment.  Of note, patient also concern for SI.   John [name], [place] 10/30/23 1441

## 2023-10-30 NOTE — ED Notes (Addendum)
 Upon discharge paperwork presented to patient, pt reported to this paramedic that he was experiencing SI by any means necessary.   MD notified via secure chat.

## 2023-10-30 NOTE — BH Assessment (Signed)
 This patient has been referred to [facility] telecare for his teleassessment.  An [place] coordinator will contact us  regarding a time and provider to see patient.

## 2023-10-31 ENCOUNTER — Ambulatory Visit (HOSPITAL_COMMUNITY)
Admission: EM | Admit: 2023-10-31 | Discharge: 2023-10-31 | Disposition: A | Discharge status: Home / Self Care | Payer: MEDICAID | Source: Home / Self Care

## 2023-10-31 DIAGNOSIS — F1210 Cannabis abuse, uncomplicated: Secondary | ICD-10-CM | POA: Insufficient documentation

## 2023-10-31 DIAGNOSIS — Z91128 Patient's intentional underdosing of medication regimen for other reason: Secondary | ICD-10-CM | POA: Insufficient documentation

## 2023-10-31 DIAGNOSIS — T43506A Underdosing of unspecified antipsychotics and neuroleptics, initial encounter: Secondary | ICD-10-CM | POA: Insufficient documentation

## 2023-10-31 DIAGNOSIS — Z5900 Homelessness unspecified: Secondary | ICD-10-CM | POA: Insufficient documentation

## 2023-10-31 DIAGNOSIS — F200 Paranoid schizophrenia: Secondary | ICD-10-CM | POA: Insufficient documentation

## 2023-10-31 DIAGNOSIS — F1990 Other psychoactive substance use, unspecified, uncomplicated: Secondary | ICD-10-CM

## 2023-10-31 DIAGNOSIS — F1410 Cocaine abuse, uncomplicated: Secondary | ICD-10-CM | POA: Insufficient documentation

## 2023-10-31 NOTE — ED Notes (Signed)
 Personal belonging given. Patient is getting dressed.

## 2023-10-31 NOTE — Discharge Instructions (Signed)
 Please come to the [facility] tomorrow morning, 8/19, before 07:00.  A therapist will conduct an assessment. Then, on Wednesday, 8/20, you'll return to see the psychiatric provider to discuss your medications.  Alternative Option: You may also contact [facility] to schedule an appointment directly with them.        Get help right away if: You have thoughts about hurting yourself or others. Get help right away if you feel like you may hurt yourself or others, or have thoughts about taking your own life. Go to your nearest emergency room or: Call 911. Call the National Suicide Prevention Lifeline at 906-309-0130 or 988 in the [place] This is open 24 hours a day. If you're a Veteran: Call 988 and press 1. This is open 24 hours a day. Text the [employer] at 807-401-5323. Summary Mental health is not just the absence of mental illness. It involves understanding your emotions and behaviors, and taking steps to manage them in a healthy way. If you have symptoms of mental or emotional distress, get help from family, friends, a health care provider, or a mental health professional. Practice good mental health behaviors such as stress management skills, self-calming skills, exercise, healthy sleeping and eating, and supportive relationships. This information is not intended to replace advice given to you by your health care provider. Make sure you discuss any questions you have with your health care provider.  Education provided on the fact that if experiencing worsening of psychiatry symptoms including suicidal ideations, homicidal ideations, or having auditory/visual hallucinations, etc, to call 911, 988, come back to this location, or go to the nearest ER. Pt verbalized understanding.

## 2023-10-31 NOTE — ED Provider Notes (Signed)
 Patient was seen by behavioral health and cleared for discharge.  If he needs medications he can present at the [facility] for evaluation   Midge Golas, MD 10/31/23 0200

## 2023-10-31 NOTE — ED Provider Notes (Signed)
 Behavioral Health Urgent Care Medical Screening Exam  Patient Name: John [name] MRN: [id number] Date of Evaluation: 10/31/23 Chief Complaint:  Feeling suicidal once again Diagnosis:  Final diagnoses:  Paranoid schizophrenia (HCC)  Polysubstance use disorder    History of Present illness:  John [name] 53 y.o., male patient presented to [facility] as a voluntary walk in unaccompanied, with complaints of "feeling suicidal once again." The patient was seen by a provider yesterday 8/17 at 23:46 and was recommended for [facility] outpatient services for medication management. He was deemed not to meet criteria for inpatient admission.  He reported that he went to the outpatient unit upstairs this morning, and was told to return to urgent care. He stated that his goal is to be restarted on his medications. He recalled previously being prescribed Remeron , Buspar , Trazodone , and Seroquel . He could not remember the last time he took them and expressed a desire to resume them.  He reported he has no psychiatrist or therapist. The patient was informed that restarting medications would require connecting with outpatient services.  He stated that he wanted to leave and asked to be discharged because he had been bounced around.  The patient was not interested in completing this assessment today.  Outpatient resources were offered, but he declined them.  See other provider's completed assessment from yesterday.  John [name], is seen face to face by this provider, consulted with Dr. Zouev; and chart reviewed on 10/31/23. As the patient was walking out, security spoke with him and relayed to the provider that the patient was now interested in obtaining outpatient resources that had been previously offered, if they acted quickly The provider then spoke with the outpatient staff, who reported that because the patient had stated he was suicidal, they had to send him back to urgent care.  The outpatient clinic was  informed that the patient was requesting to be restarted on his medications, which was the reason for his presentation today.  Outpatient resources were provided to the patient, who reported that he is homeless and has nowhere to go. He stated that he felt he had to say he was suicidal. He was made aware that he should clearly state what he is feeling and goal of presenting and avoid saying he is suicidal if he is not. He verbalized understanding.  We explored options for shelter and the provider inquired if he has a support system. He stated that he has no family and had already called shelters, but was told to call back at 2:00 PM today. He was encouraged to follow up.  Later, he asked to use the phone and said he was calling his uncle to see if he could pick him up. The patient verbally contracted for safety and is safe to discharge.  During evaluation John [name] is sitting in an upright position in no acute distress. He is alert & oriented x 4, initially cooperative, later opted to leave without completing the full assessment.  His mood is anxious, agitated with congruent affect. He has normal speech, and behavior.  Objectively, there is no evidence of mania, or delusional thinking; however, a full assessment could not be completed to definitively determine this.  Pt does not appear to be responding to internal or external stimuli.  Patient is able to converse coherently, goal directed thoughts, no distractibility, or pre-occupation. He denies suicidal/self-harm/homicidal ideation upon reassessment.  Patient answered question appropriately.    Flowsheet Row ED from 10/31/2023 in [facility] ED from  10/30/2023 in [facility] Emergency Department at [facility] ED from 10/28/2023 in [facility] Emergency Department at [facility]  C-SSRS RISK CATEGORY High Risk Moderate Risk No Risk    Psychiatric Specialty Exam  Presentation  General Appearance:Appropriate  for Environment  Eye Contact:Good  Speech:Clear and Coherent; Normal Rate  Speech Volume:Normal  Handedness:Right   Mood and Affect  Mood: Anxious; Irritable  Affect: Congruent   Thought Process  Thought Processes: Coherent  Descriptions of Associations:Intact  Orientation:Full (Time, Place and Person)  Thought Content:WDL  Diagnosis of Schizophrenia or Schizoaffective disorder in past: Yes  Duration of Psychotic Symptoms: Greater than six months  Hallucinations:None Telling him to hurt people  Ideas of Reference:None  Suicidal Thoughts:Yes, Passive Without Intent; Without Plan  Homicidal Thoughts:No Without Intent; Without Plan   Sensorium  Memory: Immediate Fair; Recent Fair  Judgment: Fair  Insight: Fair   Chartered certified accountant: Fair  Attention Span: Fair  Recall: [employer] of Knowledge: Fair  Language: Fair   Psychomotor Activity  Psychomotor Activity: Normal   Assets  Assets: Desire for Improvement; Social Support   Sleep  Sleep: Fair  Number of hours:  2   Physical Exam: Physical Exam Vitals and nursing note reviewed.  Constitutional:      Appearance: Normal appearance. He is normal weight.  HENT:     Head: Normocephalic and atraumatic.     Nose: Nose normal.     Mouth/Throat:     Pharynx: Oropharynx is clear.  Cardiovascular:     Rate and Rhythm: Normal rate and regular rhythm.  Pulmonary:     Effort: Pulmonary effort is normal.  Musculoskeletal:        General: Normal range of motion.     Cervical back: Normal range of motion.  Skin:    General: Skin is warm.  Neurological:     Mental Status: He is alert and oriented to person, place, and time.  Psychiatric:        Mood and Affect: Mood is anxious. Affect is angry.        Speech: Speech normal.        Behavior: Behavior is agitated.        Thought Content: Thought content does not include suicidal ideation.        Judgment: Judgment  normal.    Review of Systems  Psychiatric/Behavioral:  Positive for hallucinations, substance abuse and suicidal ideas. The patient is nervous/anxious and has insomnia.   All other systems reviewed and are negative.  Blood pressure (!) 127/115, pulse 69, temperature 98.7 F (37.1 C), temperature source Oral, resp. rate 15, SpO2 99%. There is no height or weight on file to calculate BMI.  Musculoskeletal: Strength & Muscle Tone: within normal limits Gait & Station: normal Patient leans: N/A   [facility] MSE Discharge Disposition for Follow up and Recommendations: Based on my evaluation the patient does not appear to have an emergency medical condition and can be discharged with resources and follow up care in outpatient services for Medication Management and Individual Therapy  Get help right away if: You have thoughts about hurting yourself or others. Get help right away if you feel like you may hurt yourself or others, or have thoughts about taking your own life. Go to your nearest emergency room or: Call 911. Call the National Suicide Prevention Lifeline at 343-289-2635 or 988 in the [place] This is open 24 hours a day. If you're a Veteran: Call 988 and press  1. This is open 24 hours a day. Text the [employer] at 678-059-2461. Summary Mental health is not just the absence of mental illness. It involves understanding your emotions and behaviors, and taking steps to manage them in a healthy way. If you have symptoms of mental or emotional distress, get help from family, friends, a health care provider, or a mental health professional. Practice good mental health behaviors such as stress management skills, self-calming skills, exercise, healthy sleeping and eating, and supportive relationships. This information is not intended to replace advice given to you by your health care provider. Make sure you discuss any questions you have with your health care provider.  Education provided on  the fact that if experiencing worsening of psychiatry symptoms including suicidal ideations, homicidal ideations, or having auditory/visual hallucinations, etc, to call 911, 988, come back to this location, or go to the nearest ER. Pt verbalized understanding.       Tosin Kimeka Badour, NP 10/31/2023, 9:44 AM
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
## 2023-11-13 ENCOUNTER — Emergency Department (HOSPITAL_COMMUNITY)
Admission: EM | Admit: 2023-11-13 | Discharge: 2023-11-13 | Disposition: A | Discharge status: Home / Self Care | Payer: MEDICAID | Attending: Emergency Medicine | Admitting: Emergency Medicine

## 2023-11-13 ENCOUNTER — Emergency Department (HOSPITAL_COMMUNITY): Payer: MEDICAID

## 2023-11-13 ENCOUNTER — Encounter (HOSPITAL_COMMUNITY): Payer: Self-pay

## 2023-11-13 ENCOUNTER — Other Ambulatory Visit: Payer: Self-pay

## 2023-11-13 DIAGNOSIS — J45901 Unspecified asthma with (acute) exacerbation: Secondary | ICD-10-CM | POA: Diagnosis not present

## 2023-11-13 DIAGNOSIS — Z5900 Homelessness unspecified: Secondary | ICD-10-CM | POA: Diagnosis not present

## 2023-11-13 DIAGNOSIS — Z21 Asymptomatic human immunodeficiency virus [HIV] infection status: Secondary | ICD-10-CM | POA: Insufficient documentation

## 2023-11-13 DIAGNOSIS — Z79899 Other long term (current) drug therapy: Secondary | ICD-10-CM | POA: Insufficient documentation

## 2023-11-13 DIAGNOSIS — K529 Noninfective gastroenteritis and colitis, unspecified: Secondary | ICD-10-CM | POA: Diagnosis not present

## 2023-11-13 DIAGNOSIS — R062 Wheezing: Secondary | ICD-10-CM | POA: Diagnosis present

## 2023-11-13 LAB — CBC WITH DIFFERENTIAL/PLATELET
Abs Immature Granulocytes: 0.01 K/uL (ref 0.00–0.07)
Basophils Absolute: 0 K/uL (ref 0.0–0.1)
Basophils Relative: 1 %
Eosinophils Absolute: 0.2 K/uL (ref 0.0–0.5)
Eosinophils Relative: 5 %
HCT: 40.2 % (ref 39.0–52.0)
Hemoglobin: 13.2 g/dL (ref 13.0–17.0)
Immature Granulocytes: 0 %
Lymphocytes Relative: 38 %
Lymphs Abs: 1.8 K/uL (ref 0.7–4.0)
MCH: 30.2 pg (ref 26.0–34.0)
MCHC: 32.8 g/dL (ref 30.0–36.0)
MCV: 92 fL (ref 80.0–100.0)
Monocytes Absolute: 0.5 K/uL (ref 0.1–1.0)
Monocytes Relative: 11 %
Neutro Abs: 2.1 K/uL (ref 1.7–7.7)
Neutrophils Relative %: 45 %
Platelets: 385 K/uL (ref 150–400)
RBC: 4.37 MIL/uL (ref 4.22–5.81)
RDW: 12.9 % (ref 11.5–15.5)
WBC: 4.6 K/uL (ref 4.0–10.5)
nRBC: 0 % (ref 0.0–0.2)

## 2023-11-13 LAB — COMPREHENSIVE METABOLIC PANEL WITH GFR
ALT: 13 U/L (ref 0–44)
AST: 20 U/L (ref 15–41)
Albumin: 3 g/dL — ABNORMAL LOW (ref 3.5–5.0)
Alkaline Phosphatase: 58 U/L (ref 38–126)
Anion gap: 6 (ref 5–15)
BUN: 12 mg/dL (ref 6–20)
CO2: 25 mmol/L (ref 22–32)
Calcium: 8.5 mg/dL — ABNORMAL LOW (ref 8.9–10.3)
Chloride: 108 mmol/L (ref 98–111)
Creatinine, Ser: 0.94 mg/dL (ref 0.61–1.24)
GFR, Estimated: >60 mL/min (ref >60)
Glucose, Bld: 91 mg/dL (ref 70–99)
Potassium: 3.5 mmol/L (ref 3.5–5.1)
Sodium: 139 mmol/L (ref 135–145)
Total Bilirubin: 0.6 mg/dL (ref 0.0–1.2)
Total Protein: 6.4 g/dL — ABNORMAL LOW (ref 6.5–8.1)

## 2023-11-13 LAB — RESP PANEL BY RT-PCR (RSV, FLU A&B, COVID)  RVPGX2
Influenza A by PCR: NEGATIVE
Influenza B by PCR: NEGATIVE
Resp Syncytial Virus by PCR: NEGATIVE
SARS Coronavirus 2 by RT PCR: NEGATIVE

## 2023-11-13 LAB — LIPASE, BLOOD: Lipase: 22 U/L (ref 11–51)

## 2023-11-13 LAB — I-STAT CG4 LACTIC ACID, ED: Lactic Acid, Venous: 1 mmol/L (ref 0.5–1.9)

## 2023-11-13 MED ORDER — SODIUM CHLORIDE 0.9 % IV BOLUS
1000.0000 mL | Freq: Once | INTRAVENOUS | Status: AC
  Administered 2023-11-13: 1000 mL via INTRAVENOUS

## 2023-11-13 MED ORDER — ALBUTEROL SULFATE HFA 108 (90 BASE) MCG/ACT IN AERS
2.0000 | INHALATION_SPRAY | Freq: Once | RESPIRATORY_TRACT | Status: AC
  Administered 2023-11-13: 2 via RESPIRATORY_TRACT
  Filled 2023-11-13: qty 6.7

## 2023-11-13 MED ORDER — IPRATROPIUM-ALBUTEROL 0.5-2.5 (3) MG/3ML IN SOLN
3.0000 mL | Freq: Once | RESPIRATORY_TRACT | Status: AC
  Administered 2023-11-13: 3 mL via RESPIRATORY_TRACT
  Filled 2023-11-13: qty 3

## 2023-11-13 MED ORDER — PREDNISONE 50 MG PO TABS: 50.0000 mg | ORAL_TABLET | Freq: Every day | ORAL | 0 refills | Status: AC

## 2023-11-13 MED ORDER — METHYLPREDNISOLONE SODIUM SUCC 125 MG IJ SOLR
125.0000 mg | Freq: Once | INTRAMUSCULAR | Status: AC
  Administered 2023-11-13: 125 mg via INTRAVENOUS
  Filled 2023-11-13: qty 2

## 2023-11-13 MED ORDER — ONDANSETRON HCL 4 MG/2ML IJ SOLN
4.0000 mg | Freq: Once | INTRAMUSCULAR | Status: AC
  Administered 2023-11-13: 4 mg via INTRAVENOUS
  Filled 2023-11-13: qty 2

## 2023-11-13 MED ORDER — METRONIDAZOLE 500 MG PO TABS: 500.0000 mg | ORAL_TABLET | Freq: Two times a day (BID) | ORAL | 0 refills | Status: AC

## 2023-11-13 MED ORDER — AZITHROMYCIN 250 MG PO TABS: 250.0000 mg | ORAL_TABLET | Freq: Every day | ORAL | 0 refills | Status: DC

## 2023-11-13 MED ORDER — IOHEXOL 350 MG/ML SOLN
75.0000 mL | Freq: Once | INTRAVENOUS | Status: AC | PRN
  Administered 2023-11-13: 75 mL via INTRAVENOUS

## 2023-11-13 NOTE — ED Triage Notes (Signed)
 Pt with inspiratory and expiratory wheezing in triage, tripod position. Tachypneic

## 2023-11-13 NOTE — ED Triage Notes (Signed)
 Patient states that his asthma is acting up. Also he states his stomach is messed up. Abdominal pain for 1 week. Shortness of breath since he got up this morning. Pt states he has diarrhea, denies nausea and vomiting.

## 2023-11-13 NOTE — ED Provider Notes (Signed)
 [place] EMERGENCY DEPARTMENT AT [facility] Provider Note   CSN: [id number] Arrival date & time: 11/13/23  1849     Patient presents with: Respiratory Distress and Abdominal Pain   John [name] is a 53 y.o. male.  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  Patient feels as though she is having a current asthma exacerbation with increased wheezing.  He does not currently have an albuterol  inhaler.  He also reports increased diarrhea for 1 week and abdominal pain.  Has not experienced nausea and vomiting but states whenever he eats goes right through him.  Has not been on  HAART for years as he does not have his medications.  Denies fevers chills skin lesions and mouth sores.    Abdominal Pain      Prior to Admission medications   Medication Sig Start Date End Date Taking? Authorizing Provider  azithromycin  (ZITHROMAX ) 250 MG tablet Take 1 tablet (250 mg total) by mouth daily. Take first 2 tablets together, then 1 every day until finished. 11/13/23  Yes Pamella Ozell LABOR, DO  metroNIDAZOLE  (FLAGYL ) 500 MG tablet Take 1 tablet (500 mg total) by mouth 2 (two) times daily for 6 days. 11/13/23 11/19/23 Yes Pamella Ozell LABOR, DO  predniSONE  (DELTASONE ) 50 MG tablet Take 1 tablet (50 mg total) by mouth daily with breakfast for 3 days. 11/13/23 11/16/23 Yes Pamella Ozell LABOR, DO  albuterol  (VENTOLIN  HFA) 108 (90 Base) MCG/ACT inhaler Inhale 2 puffs into the lungs every 4 (four) hours as needed for wheezing or shortness of breath. 01/24/23   Karna Fellows, MD  albuterol  (VENTOLIN  HFA) 108 [phone] Base) MCG/ACT inhaler Inhale 1-2 puffs into the lungs every 6 (six) hours as needed for wheezing or shortness of breath. 09/15/23 10/15/23  Kammerer, Duwaine L, DO  albuterol  (VENTOLIN  HFA) 108 (90 Base) MCG/ACT inhaler Inhale 1-2 puffs into the lungs every 6 (six) hours as needed for wheezing or shortness of breath. 10/30/23   Sharyne Darina RAMAN, MD   bictegravir-emtricitabine -tenofovir  AF (BIKTARVY ) 50-200-25 MG TABS tablet Take 1 tablet by mouth daily. 01/25/23   Calone, Gregory D, FNP  budesonide -formoterol  (SYMBICORT ) 160-4.5 MCG/ACT inhaler Inhale 2 puffs into the lungs 2 (two) times daily. 01/24/23   Tobie Gaines, DO  busPIRone  (BUSPAR ) 5 MG tablet Take 1 tablet (5 mg total) by mouth 2 (two) times daily. 01/24/23   Tobie Gaines, DO  mirtazapine  (REMERON ) 15 MG tablet Take 1 tablet (15 mg total) by mouth at bedtime. 01/24/23 02/23/23  Tobie Gaines, DO  QUEtiapine  (SEROQUEL  XR) 300 MG 24 hr tablet Take 1 tablet (300 mg total) by mouth at bedtime. 01/24/23   Tobie Gaines, DO  traZODone  (DESYREL ) 50 MG tablet Take 1 tablet (50 mg total) by mouth at bedtime as needed for sleep (sleep difficulty). 01/24/23 02/23/23  Tobie Gaines, DO  dicyclomine  (BENTYL ) 20 MG tablet Take 1 tablet (20 mg total) by mouth 2 (two) times daily. 01/08/17 01/03/20  Palumbo, April, MD  sucralfate  (CARAFATE ) 1 GM/10ML suspension Take 10 mLs (1 g total) by mouth 4 (four) times daily -  with meals and at bedtime. Patient not taking: Reported on 10/21/2017 01/08/17 09/03/18  Palumbo, April, MD    Allergies: Shellfish allergy    Review of Systems  Gastrointestinal:  Positive for abdominal pain.    Updated Vital Signs BP 123/80   Pulse 66   Temp 98.2 F (36.8 C)   Resp 18  SpO2 100%   Physical Exam Vitals and nursing note reviewed.  HENT:     Head: Normocephalic and atraumatic.  Eyes:     Pupils: Pupils are equal, round, and reactive to light.  Cardiovascular:     Rate and Rhythm: Normal rate and regular rhythm.  Pulmonary:     Effort: Pulmonary effort is normal. No respiratory distress.     Breath sounds: Normal breath sounds. No wheezing.  Abdominal:     Palpations: Abdomen is soft.     Tenderness: There is no abdominal tenderness.  Skin:    General: Skin is warm and dry.  Neurological:     Mental Status: He is alert.  Psychiatric:        Mood and  Affect: Mood normal.     (all labs ordered are listed, but only abnormal results are displayed) Labs Reviewed  COMPREHENSIVE METABOLIC PANEL WITH GFR - Abnormal; Notable for the following components:      Result Value   Calcium 8.5 (*)    Total Protein 6.4 (*)    Albumin 3.0 (*)    All other components within normal limits  RESP PANEL BY RT-PCR (RSV, FLU A&B, COVID)  RVPGX2  CULTURE, BLOOD (ROUTINE X 2)  CULTURE, BLOOD (ROUTINE X 2)  CBC WITH DIFFERENTIAL/PLATELET  LIPASE, BLOOD  T-HELPER CELLS (CD4) COUNT (NOT AT [facility])  I-STAT CG4 LACTIC ACID, ED    EKG: EKG Interpretation Date/Time:  Sunday November 13 2023 19:02:32 EDT Ventricular Rate:  77 PR Interval:  134 QRS Duration:  93 QT Interval:  367 QTC Calculation: 416 R Axis:   54  Text Interpretation: Sinus rhythm Anterior infarct, old Confirmed by Pamella Sharper [phone] on 11/13/2023 11:35:17 PM  Radiology: CT CHEST ABDOMEN PELVIS W CONTRAST Result Date: 11/13/2023 CLINICAL DATA:  Shortness of breath with diarrhea.  History of HIV. EXAM: CT CHEST, ABDOMEN, AND PELVIS WITH CONTRAST TECHNIQUE: Multidetector CT imaging of the chest, abdomen and pelvis was performed following the standard protocol during bolus administration of intravenous contrast. RADIATION DOSE REDUCTION: This exam was performed according to the departmental dose-optimization program which includes automated exposure control, adjustment of the mA and/or kV according to patient size and/or use of iterative reconstruction technique. CONTRAST:  75mL OMNIPAQUE  IOHEXOL  350 MG/ML SOLN COMPARISON:  CT of the chest 01/23/2023. CT abdomen and pelvis 12/02/2002. FINDINGS: CT CHEST FINDINGS Cardiovascular: No significant vascular findings. Normal heart size. No pericardial effusion. Mediastinum/Nodes: Visualized thyroid  gland and esophagus are within normal limits. There is an enlarged precarinal lymph node measuring 10 mm short axis, slightly increased in size. Enlarged right  hilar lymph node measures 12 mm, slightly increased in size. Lungs/Pleura: Mild emphysema present. There central peribronchial wall thickening in the right lower lobe. There is no focal lung consolidation, pleural effusion or pneumothorax. There is a nodule in the left lung base measuring 4 mm image 5/144 which is unchanged. There is a stable 2 mm lingular nodule. Trachea and central airways are patent. Musculoskeletal: No acute osseous abnormality. CT ABDOMEN PELVIS FINDINGS Hepatobiliary: There some focal fatty infiltration along the falciform ligament. Otherwise, the liver, gallbladder and bile ducts are within normal limits. Pancreas: Unremarkable. No pancreatic ductal dilatation or surrounding inflammatory changes. Spleen: Normal in size without focal abnormality. Adrenals/Urinary Tract: Adrenal glands are unremarkable. Kidneys are normal, without renal calculi, focal lesion, or hydronephrosis. Bladder is unremarkable. Stomach/Bowel: There is wall thickening and mild surrounding inflammation of the rectosigmoid colon. There is no bowel obstruction, pneumatosis or free air. The  appendix and stomach are within normal limits. Vascular/Lymphatic: No significant vascular findings are present. No enlarged abdominal or pelvic lymph nodes. Reproductive: Prostate is unremarkable. Other: There small fat containing inguinal hernias. There is no ascites. Musculoskeletal: No acute or significant osseous findings. IMPRESSION: 1. Wall thickening and mild surrounding inflammation of the rectosigmoid colon compatible with proctocolitis. 2. Mild mediastinal and right hilar lymphadenopathy, slightly increased in size. 3. Right lower lobe peribronchial wall thickening, likely infectious/inflammatory. 4. Mild emphysema. 5. Stable pulmonary nodules measuring up to 4 mm. No follow-up needed if patient is low-risk (and has no known or suspected primary neoplasm). Non-contrast chest CT can be considered in 12 months if patient is  high-risk. This recommendation follows the consensus statement: Guidelines for Management of Incidental Pulmonary Nodules Detected on CT Images: From the Fleischner Society 2017; Radiology 2017; 284:228-243. Emphysema (ICD10-J43.9). Electronically Signed   By: Greig Pique M.D.   On: 11/13/2023 21:43   DG Chest Portable 1 View Result Date: 11/13/2023 CLINICAL DATA:  Respiratory distress, evaluate for pneumonia. EXAM: PORTABLE CHEST 1 VIEW COMPARISON:  Chest x-ray 10/30/2023 FINDINGS: Are focal slightly nodular densities projecting over the bilateral mid lungs, indeterminate from nipple shadows. The lungs are otherwise clear. There is no pleural effusion or pneumothorax. The cardiomediastinal silhouette is within normal limits. IMPRESSION: Focal slightly nodular densities projecting over the bilateral mid lungs, indeterminate from nipple shadows. Recommend repeat chest x-ray with nipple markers to exclude pulmonary nodules. Electronically Signed   By: Greig Pique M.D.   On: 11/13/2023 19:26     Procedures   Medications Ordered in the ED  albuterol  (VENTOLIN  HFA) 108 (90 Base) MCG/ACT inhaler 2 puff (has no administration in time range)  ondansetron  (ZOFRAN ) injection 4 mg (4 mg Intravenous Given 11/13/23 1945)  sodium chloride  0.9 % bolus 1,000 mL (0 mLs Intravenous Stopped 11/13/23 2156)  ipratropium-albuterol  (DUONEB) 0.5-2.5 (3) MG/3ML nebulizer solution 3 mL (3 mLs Nebulization Given 11/13/23 1952)  methylPREDNISolone  sodium succinate (SOLU-MEDROL ) 125 mg/2 mL injection 125 mg (125 mg Intravenous Given 11/13/23 1944)  iohexol  (OMNIPAQUE ) 350 MG/ML injection 75 mL (75 mLs Intravenous Contrast Given 11/13/23 2106)    Clinical Course as of 11/13/23 2347  Sun Nov 13, 2023  2335 CT chest abdomen pelvis shows findings consistent with proctocolitis as well as right lower lobe inflammation.  Given patient's immunocompromise status will cover with antibiotics empirically.  Will give another few days of  prednisone  for asthma exacerbation.  No significant electrolyte imbalance or other laboratory abnormalities.  Appropriate for discharge at this time [MP]    Clinical Course User Index [MP] Pamella Ozell LABOR, DO                                 Medical Decision Making 53 year old male with history as above presenting for asthma exacerbation and persistent diarrhea for 1 week.  Afebrile and tachypneic.  Audible wheezing on exam.  No significant abdominal tenderness.  Considering history of HIV not on therapy concern for potential of AIDS defining illnesses such as PJP pneumonia and Cryptosporidium.  Will obtain laboratory workup for infectious etiology, provide breathing treatment along with CT chest abdomen pelvis to look for evidence of intrathoracic or abdominal infection.  Amount and/or Complexity of Data Reviewed Labs: ordered. Radiology: ordered.  Risk Prescription drug management.        Final diagnoses:  Mild asthma with exacerbation, unspecified whether persistent  Colitis    ED  Discharge Orders          Ordered    predniSONE  (DELTASONE ) 50 MG tablet  Daily with breakfast        11/13/23 2346    azithromycin  (ZITHROMAX ) 250 MG tablet  Daily        11/13/23 2346    metroNIDAZOLE  (FLAGYL ) 500 MG tablet  2 times daily        11/13/23 2346               Pamella Ozell LABOR, DO 11/13/23 2348

## 2023-11-13 NOTE — Discharge Instructions (Addendum)
 You were seen in the emergency room for asthma exacerbation and diarrhea We have called in a prescription for antibiotics and prednisone  to your pharmacy to help with the infection in your GI tract and with the asthma exacerbation Continue using the albuterol  inhaler provided Follow-up with a primary doctor at the [facility] community center to establish care Return to the emergency room for trouble breathing or any other concerns

## 2023-11-13 NOTE — ED Notes (Signed)
 Patient placed on 12-lead.

## 2023-11-18 LAB — CULTURE, BLOOD (ROUTINE X 2)
Culture: NO GROWTH
Culture: NO GROWTH
Special Requests: ADEQUATE

## 2023-11-22 ENCOUNTER — Telehealth: Payer: Self-pay

## 2023-11-22 NOTE — Telephone Encounter (Signed)
 Attempt to Re-Engage in Care  No office visit or HIV labs completed at [facility] within the last 12 months. Patient is considered out of care.   Last [facility] Visit: 05/03/22  Last HIV Viral Load:  HIV 1 RNA Quant  Date Value Ref Range Status  01/24/2023 <20 copies/mL Corrected    Comment:    (NOTE) HIV-1 RNA not detected The reportable range for this assay is 20 to 10,000,000 copies HIV-1 RNA/mL. Performed At: [facility] [address] [place], [place] [id number] Jennette Shorter MD Ey:[id number]     Last CD4 Count:  CD4  Date Value Ref Range Status  07/24/2015 948  Final   CD4 T Cell Abs  Date Value Ref Range Status  12/20/2022 451 400 - 1,790 /uL Final    Medication Dispense History:   Dispensed Days Supply Quantity Provider Pharmacy  bictegravir-emtricitabine -tenofovir  AF (BIKTARVY ) 50-200-25 MG TABS tablet 01/24/2023 30 30 tablet Calone, Gregory D, FNP [facility]...  bictegravir-emtricitabine -tenofovir  AF (BIKTARVY ) 50-200-25 MG TABS tablet 12/20/2022 30 30 tablet Zheng, Michael, DO [facility]...  bictegravir-emtricitabine -tenofovir  AF (BIKTARVY ) 50-200-25 MG TABS tablet 12/03/2022 30 30 tablet Jolaine Pac, DO [facility]...    Interventions: No phone number to reach [place] on file.   No recent labs in [facility]. No recent Care Everywhere updates.  John [name] has had multiple ED encounters over the months of July and August, mostly for asthma concerns.   Spoke with patient's aunt, John [name]. She provided the following phone numbers: [phone] and [phone].  No answer on first number. Left HIPAA compliant voicemail requesting callback.   Called second number, no answer. Left HIPAA compliant voicemail requesting callback.   Phone numbers updated in patient's chart.   No recent MyChart activity.   Duration of Services: 20 minutes  John [name], BSN, Charity fundraiser

## 2023-11-22 NOTE — Telephone Encounter (Signed)
 Received call from a Reyes Foster, the phone numbers provided by Jodie Foster belong to him. He does not have any contact info for [name]. Did not discuss any private or health information. Phone numbers removed from patient's chart.   Karolyna Bianchini, BSN, RN

## 2023-11-29 ENCOUNTER — Ambulatory Visit: Payer: MEDICAID

## 2023-11-29 ENCOUNTER — Other Ambulatory Visit: Payer: Self-pay

## 2023-11-29 ENCOUNTER — Other Ambulatory Visit: Payer: MEDICAID

## 2023-11-29 DIAGNOSIS — Z113 Encounter for screening for infections with a predominantly sexual mode of transmission: Secondary | ICD-10-CM

## 2023-11-29 DIAGNOSIS — Z21 Asymptomatic human immunodeficiency virus [HIV] infection status: Secondary | ICD-10-CM

## 2023-11-29 DIAGNOSIS — Z79899 Other long term (current) drug therapy: Secondary | ICD-10-CM

## 2023-11-29 NOTE — Patient Instructions (Signed)
 Go to [facility] on 12/15/23 at 10:00 (arrive 20 min early)  [address] [place], [place] [address].  Go to [address] [employer] [place], [place] for Medication samples. Closed for lunch until 1245.

## 2023-11-29 NOTE — Progress Notes (Signed)
 Nursing Intake Note - [facility]  Chief Complaint: Needs help with making appt.   Living Situation: homeless Insurance Status:    New Patient Status:  [x]  New to [employer] practices reviewed  HIPAA form signed and documented  Consent for digital charting: [x]  Signed []  Not Signed  Additional Notes:  Vital signs taken and entered in flowsheet  Interpreter services: []  Needed [x]  Not Needed  Patient oriented to mobile clinic services and process  SDOH screening completed  Referral to provider: []  Needed [x]  Not Needed  RN Interventions Provided:  [x]  Health education (e.g., chronic disease, hygiene, nutrition)    [x]  Other: Appointment made for Case management at [facility] Bus pass given.

## 2023-12-01 LAB — T-HELPER CELL (CD4) - (RCID CLINIC ONLY)
CD4 % Helper T Cell: 31 % — ABNORMAL LOW (ref 33–65)
CD4 T Cell Abs: 412 /uL (ref 400–1790)

## 2023-12-02 LAB — COMPLETE METABOLIC PANEL WITHOUT GFR
AG Ratio: 1.6 (calc) (ref 1.0–2.5)
ALT: 14 U/L (ref 9–46)
AST: 18 U/L (ref 10–35)
Albumin: 4.2 g/dL (ref 3.6–5.1)
Alkaline phosphatase (APISO): 79 U/L (ref 35–144)
BUN: 22 mg/dL (ref 7–25)
CO2: 27 mmol/L (ref 20–32)
Calcium: 9 mg/dL (ref 8.6–10.3)
Chloride: 104 mmol/L (ref 98–110)
Creat: 0.92 mg/dL (ref 0.70–1.30)
Globulin: 2.7 g/dL (ref 1.9–3.7)
Glucose, Bld: 80 mg/dL (ref 65–99)
Potassium: 4.2 mmol/L (ref 3.5–5.3)
Sodium: 141 mmol/L (ref 135–146)
Total Bilirubin: 0.5 mg/dL (ref 0.2–1.2)
Total Protein: 6.9 g/dL (ref 6.1–8.1)

## 2023-12-02 LAB — CBC WITH DIFFERENTIAL/PLATELET
Absolute Lymphocytes: 1299 {cells}/uL (ref 850–3900)
Absolute Monocytes: 371 {cells}/uL (ref 200–950)
Basophils Absolute: 31 {cells}/uL (ref 0–200)
Basophils Relative: 0.9 %
Eosinophils Absolute: 112 {cells}/uL (ref 15–500)
Eosinophils Relative: 3.3 %
HCT: 38.1 % — ABNORMAL LOW (ref 38.5–50.0)
Hemoglobin: 13 g/dL — ABNORMAL LOW (ref 13.2–17.1)
MCH: 30.8 pg (ref 27.0–33.0)
MCHC: 34.1 g/dL (ref 32.0–36.0)
MCV: 90.3 fL (ref 80.0–100.0)
MPV: 9.2 fL (ref 7.5–12.5)
Monocytes Relative: 10.9 %
Neutro Abs: 1588 {cells}/uL (ref 1500–7800)
Neutrophils Relative %: 46.7 %
Platelets: 317 Thousand/uL (ref 140–400)
RBC: 4.22 Million/uL (ref 4.20–5.80)
RDW: 12.7 % (ref 11.0–15.0)
Total Lymphocyte: 38.2 %
WBC: 3.4 Thousand/uL — ABNORMAL LOW (ref 3.8–10.8)

## 2023-12-02 LAB — LIPID PANEL
Cholesterol: 157 mg/dL (ref <200)
HDL: 63 mg/dL (ref >=40)
LDL Cholesterol (Calc): 76 mg/dL
Non-HDL Cholesterol (Calc): 94 mg/dL (ref <130)
Total CHOL/HDL Ratio: 2.5 (calc) (ref <5.0)
Triglycerides: 98 mg/dL (ref <150)

## 2023-12-02 LAB — RPR: RPR Ser Ql: NONREACTIVE

## 2023-12-02 LAB — HIV-1 RNA QUANT-NO REFLEX-BLD
HIV 1 RNA Quant: 318 {copies}/mL — ABNORMAL HIGH
HIV-1 RNA Quant, Log: 2.5 {Log_copies}/mL — ABNORMAL HIGH

## 2023-12-09 ENCOUNTER — Telehealth: Payer: Self-pay

## 2023-12-09 ENCOUNTER — Other Ambulatory Visit (HOSPITAL_COMMUNITY): Payer: Self-pay

## 2023-12-09 NOTE — Telephone Encounter (Signed)
 Pharmacy Patient Advocate Encounter  Insurance verification completed.   The patient is insured through [employer]   Ran test claim for Biktarvy . Currently a quantity of 30 is a 30 day supply and the co-pay is $0.00 . Cabenuva $0.00  This test claim was processed through [facility]- copay amounts may vary at other pharmacies due to pharmacy/plan contracts, or as the patient moves through the different stages of their insurance plan.

## 2023-12-12 ENCOUNTER — Ambulatory Visit: Payer: MEDICAID | Admitting: Internal Medicine

## 2023-12-12 NOTE — Progress Notes (Deleted)
 [facility]     HPI: John [name] is a 53 y.o. male presents for HIV management. Today @DATE @: Discussed the use of AI scribe software for clinical note transcription with the patient, who gave verbal consent to proceed.  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  Anal sex receptive***, insertive***. Contraception**** Oral sex, contraception*** Vaginal penile sex, contraception***  Social: Occupation: Housing: Support: Understanding of HIV: Etoh/drug/tobacco use:  Past Medical History:  Diagnosis Date   Acute hypoxemic respiratory failure (HCC) 05/07/2021   Adjustment disorder with depressed mood 10/28/2015   Alcoholism (HCC)    Asthma    Bipolar disorder (HCC)    with depression/anxiety   Cannabis use disorder, moderate, dependence (HCC) 04/02/2015   Chronic low back pain    Cocaine use disorder (HCC) 04/02/2015   Gout    HIV (human immunodeficiency virus infection) (HCC)    dx'd ~ 2 yr ago (09/29/2012)   Homelessness    Hypertension    Polysubstance abuse (HCC) 04/11/2019    Past Surgical History:  Procedure Laterality Date   SKIN GRAFT FULL THICKNESS LEG Left ?   POSTERIOR LEFT LEG  AFTER DOG BITES    Family History  Problem Relation Age of Onset   Alcoholism Mother    Depression Mother    Alcoholism Brother    Current Outpatient Medications on File Prior to Visit  Medication Sig Dispense Refill   albuterol  (VENTOLIN  HFA) 108 (90 Base) MCG/ACT inhaler Inhale 2 puffs into the lungs every 4 (four) hours as needed for wheezing or shortness of breath. 18 g 2   albuterol  (VENTOLIN  HFA) 108 (90 Base) MCG/ACT inhaler Inhale 1-2 puffs into the lungs every 6 (six) hours as needed for wheezing or shortness of breath. (Patient not taking: Reported on 11/29/2023) 6.7 g 0   albuterol  (VENTOLIN  HFA) 108 (90  Base) MCG/ACT inhaler Inhale 1-2 puffs into the lungs every 6 (six) hours as needed for wheezing or shortness of breath. (Patient not taking: Reported on 11/29/2023) 1 each 0   azithromycin  (ZITHROMAX ) 250 MG tablet Take 1 tablet (250 mg total) by mouth daily. Take first 2 tablets together, then 1 every day until finished. (Patient not taking: Reported on 11/29/2023) 6 tablet 0   bictegravir-emtricitabine -tenofovir  AF (BIKTARVY ) 50-200-25 MG TABS tablet Take 1 tablet by mouth daily. 30 tablet 0   budesonide -formoterol  (SYMBICORT ) 160-4.5 MCG/ACT inhaler Inhale 2 puffs into the lungs 2 (two) times daily. (Patient taking differently: Inhale 2 puffs into the lungs 2 (two) times daily.) 10.2 g 0   busPIRone  (BUSPAR ) 5 MG tablet Take 1 tablet (5 mg total) by mouth 2 (two) times daily. (Patient not taking: Reported on 11/29/2023) 60 tablet 0   mirtazapine  (REMERON ) 15 MG tablet Take 1 tablet (15 mg total) by mouth at bedtime. (Patient not taking: Reported on 11/29/2023) 30 tablet 0   QUEtiapine  (SEROQUEL  XR) 300 MG 24 hr tablet Take 1 tablet (300 mg total) by mouth at bedtime. 30 tablet 0   traZODone  (DESYREL ) 50 MG tablet Take 1 tablet (50 mg total) by mouth at bedtime as needed for sleep (sleep difficulty). (Patient not taking: Reported on 11/29/2023) 30 tablet 0   [DISCONTINUED] dicyclomine  (BENTYL ) 20 MG tablet Take 1 tablet (20 mg total) by mouth 2 (two) times daily. 20 tablet 0   [DISCONTINUED] sucralfate  (  CARAFATE ) 1 GM/10ML suspension Take 10 mLs (1 g total) by mouth 4 (four) times daily -  with meals and at bedtime. (Patient not taking: Reported on 10/21/2017) 420 mL 0   No current facility-administered medications on file prior to visit.    Allergies  Allergen Reactions   Shellfish Allergy Anaphylaxis and Swelling      Lab Results HIV 1 RNA Quant (copies/mL)  Date Value  11/29/2023 318 (H)  01/24/2023 <20  12/03/2022 130   CD4 (no units)  Date Value  07/24/2015 948  02/27/2015 503   08/23/2014 515   CD4 T Cell Abs (/uL)  Date Value  11/29/2023 412  12/20/2022 451  12/03/2022    CANCELED BY LAB, SPECIMEN DOES NOT MEET COLLECTION REQUIREMENTS   Lab Results  Component Value Date   HIV1GENOSEQ REPORT 12/04/2013   Lab Results  Component Value Date   WBC 3.4 (L) 11/29/2023   HGB 13.0 (L) 11/29/2023   HCT 38.1 (L) 11/29/2023   MCV 90.3 11/29/2023   PLT 317 11/29/2023    Lab Results  Component Value Date   CREATININE 0.92 11/29/2023   BUN 22 11/29/2023   NA 141 11/29/2023   K 4.2 11/29/2023   CL 104 11/29/2023   CO2 27 11/29/2023   Lab Results  Component Value Date   ALT 14 11/29/2023   AST 18 11/29/2023   ALKPHOS 58 11/13/2023   BILITOT 0.5 11/29/2023    Lab Results  Component Value Date   CHOL 157 11/29/2023   TRIG 98 11/29/2023   HDL 63 11/29/2023   LDLCALC 76 11/29/2023   Lab Results  Component Value Date   HAV NON REACTIVE 11/23/2021   Lab Results  Component Value Date   HEPBSAG NON REACTIVE 11/23/2021   HEPBSAB NEG 04/08/2010   Lab Results  Component Value Date   HCVAB NON REACTIVE 11/23/2021   Lab Results  Component Value Date   CHLAMYDIAWP Negative 08/07/2021   N Negative 08/07/2021   No results found for: GCPROBEAPT No results found for: QUANTGOLD  Assessment/Plan #HIV -cd4 412 VL 315, on  11/29/23 on lbiktravy     #Vaccination COVID Flu needs Monkeypox PCV 20-needs Meningitis utd HepA serolgy HEpB immunized- serolgy Tdap Shingles  #Health maintenance -Quantiferon today -RPR nr -HCV today -GC today -Lipid The 10-year ASCVD risk score (Arnett DK, et al., 2019) is: 7.5%   Values used to calculate the score:     Age: 60 years     Clincally relevant sex: Male     Is Non-Hispanic African American: Yes     Diabetic: No     Tobacco smoker: Yes     Systolic Blood Pressure: 118 mmHg     Is BP treated: No     HDL Cholesterol: 63 mg/dL     Total Cholesterol: 157 mg/dL  -Dysplasia screen  F/M -Mammogram  -Colonoscopy    Loney Stank, MD [facility]

## 2024-02-08 ENCOUNTER — Other Ambulatory Visit: Payer: Self-pay

## 2024-02-08 ENCOUNTER — Encounter (HOSPITAL_COMMUNITY): Payer: Self-pay

## 2024-02-08 ENCOUNTER — Emergency Department (HOSPITAL_COMMUNITY)
Admission: EM | Admit: 2024-02-08 | Discharge: 2024-02-08 | Disposition: A | Discharge status: Home / Self Care | Payer: MEDICAID | Attending: Emergency Medicine | Admitting: Emergency Medicine

## 2024-02-08 ENCOUNTER — Emergency Department (HOSPITAL_COMMUNITY): Payer: MEDICAID

## 2024-02-08 DIAGNOSIS — R0602 Shortness of breath: Secondary | ICD-10-CM | POA: Diagnosis present

## 2024-02-08 DIAGNOSIS — J4521 Mild intermittent asthma with (acute) exacerbation: Secondary | ICD-10-CM | POA: Insufficient documentation

## 2024-02-08 DIAGNOSIS — Z5900 Homelessness unspecified: Secondary | ICD-10-CM | POA: Diagnosis not present

## 2024-02-08 DIAGNOSIS — F17210 Nicotine dependence, cigarettes, uncomplicated: Secondary | ICD-10-CM | POA: Diagnosis not present

## 2024-02-08 DIAGNOSIS — I10 Essential (primary) hypertension: Secondary | ICD-10-CM | POA: Diagnosis not present

## 2024-02-08 DIAGNOSIS — Z21 Asymptomatic human immunodeficiency virus [HIV] infection status: Secondary | ICD-10-CM | POA: Diagnosis not present

## 2024-02-08 LAB — I-STAT VENOUS BLOOD GAS, ED
Acid-Base Excess: 1 mmol/L (ref 0.0–2.0)
Bicarbonate: 25.3 mmol/L (ref 20.0–28.0)
Calcium, Ion: 1.07 mmol/L — ABNORMAL LOW (ref 1.15–1.40)
HCT: 41 % (ref 39.0–52.0)
Hemoglobin: 13.9 g/dL (ref 13.0–17.0)
O2 Saturation: 86 %
Potassium: 3.8 mmol/L (ref 3.5–5.1)
Sodium: 140 mmol/L (ref 135–145)
TCO2: 26 mmol/L (ref 22–32)
pCO2, Ven: 39.6 mmHg — ABNORMAL LOW (ref 44–60)
pH, Ven: 7.413 (ref 7.25–7.43)
pO2, Ven: 51 mmHg — ABNORMAL HIGH (ref 32–45)

## 2024-02-08 LAB — BASIC METABOLIC PANEL WITH GFR
Anion gap: 15 (ref 5–15)
BUN: 14 mg/dL (ref 6–20)
CO2: 24 mmol/L (ref 22–32)
Calcium: 8.9 mg/dL (ref 8.9–10.3)
Chloride: 101 mmol/L (ref 98–111)
Creatinine, Ser: 1 mg/dL (ref 0.61–1.24)
GFR, Estimated: >60 mL/min (ref >60)
Glucose, Bld: 160 mg/dL — ABNORMAL HIGH (ref 70–99)
Potassium: 3.9 mmol/L (ref 3.5–5.1)
Sodium: 140 mmol/L (ref 135–145)

## 2024-02-08 LAB — CBC WITH DIFFERENTIAL/PLATELET
Abs Immature Granulocytes: 0.01 K/uL (ref 0.00–0.07)
Basophils Absolute: 0 K/uL (ref 0.0–0.1)
Basophils Relative: 0 %
Eosinophils Absolute: 0.1 K/uL (ref 0.0–0.5)
Eosinophils Relative: 2 %
HCT: 41.6 % (ref 39.0–52.0)
Hemoglobin: 13.7 g/dL (ref 13.0–17.0)
Immature Granulocytes: 0 %
Lymphocytes Relative: 45 %
Lymphs Abs: 2 K/uL (ref 0.7–4.0)
MCH: 30.3 pg (ref 26.0–34.0)
MCHC: 32.9 g/dL (ref 30.0–36.0)
MCV: 92 fL (ref 80.0–100.0)
Monocytes Absolute: 0.5 K/uL (ref 0.1–1.0)
Monocytes Relative: 12 %
Neutro Abs: 1.9 K/uL (ref 1.7–7.7)
Neutrophils Relative %: 41 %
Platelets: 307 K/uL (ref 150–400)
RBC: 4.52 MIL/uL (ref 4.22–5.81)
RDW: 12.8 % (ref 11.5–15.5)
WBC: 4.5 K/uL (ref 4.0–10.5)
nRBC: 0 % (ref 0.0–0.2)

## 2024-02-08 MED ORDER — METHYLPREDNISOLONE SODIUM SUCC 125 MG IJ SOLR
125.0000 mg | Freq: Once | INTRAMUSCULAR | Status: AC
  Administered 2024-02-08: 125 mg via INTRAVENOUS
  Filled 2024-02-08: qty 2

## 2024-02-08 MED ORDER — ALBUTEROL SULFATE (2.5 MG/3ML) 0.083% IN NEBU
INHALATION_SOLUTION | RESPIRATORY_TRACT | Status: AC
  Filled 2024-02-08: qty 3

## 2024-02-08 MED ORDER — BIKTARVY 50-200-25 MG PO TABS: 1.0000 | ORAL_TABLET | Freq: Every day | ORAL | 0 refills | Status: AC

## 2024-02-08 MED ORDER — ALBUTEROL SULFATE HFA 108 (90 BASE) MCG/ACT IN AERS
2.0000 | INHALATION_SPRAY | Freq: Once | RESPIRATORY_TRACT | Status: DC
  Filled 2024-02-08: qty 6.7

## 2024-02-08 MED ORDER — ALBUTEROL SULFATE HFA 108 (90 BASE) MCG/ACT IN AERS: 2.0000 | INHALATION_SPRAY | RESPIRATORY_TRACT | 2 refills | Status: AC | PRN

## 2024-02-08 MED ORDER — PREDNISONE 50 MG PO TABS: 50.0000 mg | ORAL_TABLET | Freq: Every day | ORAL | 0 refills | Status: AC

## 2024-02-08 MED ORDER — ALBUTEROL SULFATE (2.5 MG/3ML) 0.083% IN NEBU
10.0000 mg/h | INHALATION_SOLUTION | Freq: Once | RESPIRATORY_TRACT | Status: AC
  Administered 2024-02-08: 10 mg/h via RESPIRATORY_TRACT
  Filled 2024-02-08: qty 9

## 2024-02-08 NOTE — ED Triage Notes (Signed)
 [place] [facility] [place] [facility] [place]/o sob and cough. Per EMS pt was on nrb on their arrival. Pt was given duoneb treatment by EMS. Noted to have wheezing. 97% O2 on RA after treatment. PT states he has been out of his asthma inhaler for the past 3 weeks. Pt also states cough started 3 weeks ago with it mostly being dry but some mucus at times. Pt is smoker.  BP 120/80's RR19 T 99 HR 80
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
## 2024-02-08 NOTE — ED Notes (Signed)
 Pt provided with food and drink, Ok per MD [employer]

## 2024-02-08 NOTE — Discharge Instructions (Signed)
 We evaluated you for your asthma.  Your symptoms improved with breathing treatment in the emergency department.  We have prescribed you an inhaler and steroids.  Please pick this up.  We have also refilled your HIV medication.  Please call [facility] to schedule an appointment.  They have been trying to get in contact with you.  Please return if you have any new or worsening symptoms.

## 2024-03-30 ENCOUNTER — Telehealth: Payer: Self-pay

## 2024-03-30 NOTE — Telephone Encounter (Signed)
 Called patient to schedule appointment, male answered and states John [name] is not available. She asked is this was his ID provider and states she will have Reyes call back. Did not discuss any private or health information.   Aziya Arena, BSN, RN
